# Patient Record
Sex: Female | Born: 1935
Health system: Southern US, Community
[De-identification: ages and names within clinical notes are randomized; demographics above are authoritative.]

## PROBLEM LIST (undated history)

## (undated) DIAGNOSIS — I480 Paroxysmal atrial fibrillation: Secondary | ICD-10-CM

## (undated) DIAGNOSIS — E669 Obesity, unspecified: Secondary | ICD-10-CM

## (undated) DIAGNOSIS — IMO0001 Reserved for inherently not codable concepts without codable children: Secondary | ICD-10-CM

## (undated) DIAGNOSIS — I10 Essential (primary) hypertension: Secondary | ICD-10-CM

## (undated) DIAGNOSIS — IMO0002 Reserved for concepts with insufficient information to code with codable children: Secondary | ICD-10-CM

## (undated) DIAGNOSIS — I509 Heart failure, unspecified: Secondary | ICD-10-CM

## (undated) DIAGNOSIS — I2510 Atherosclerotic heart disease of native coronary artery without angina pectoris: Secondary | ICD-10-CM

## (undated) DIAGNOSIS — E119 Type 2 diabetes mellitus without complications: Secondary | ICD-10-CM

## (undated) DIAGNOSIS — I639 Cerebral infarction, unspecified: Secondary | ICD-10-CM

## (undated) DIAGNOSIS — E785 Hyperlipidemia, unspecified: Secondary | ICD-10-CM

## (undated) DIAGNOSIS — R42 Dizziness and giddiness: Secondary | ICD-10-CM

## (undated) DIAGNOSIS — K5900 Constipation, unspecified: Secondary | ICD-10-CM

## (undated) DIAGNOSIS — K219 Gastro-esophageal reflux disease without esophagitis: Secondary | ICD-10-CM

## (undated) DIAGNOSIS — N390 Urinary tract infection, site not specified: Secondary | ICD-10-CM

## (undated) DIAGNOSIS — N186 End stage renal disease: Secondary | ICD-10-CM

## (undated) DIAGNOSIS — C50919 Malignant neoplasm of unspecified site of unspecified female breast: Secondary | ICD-10-CM

## (undated) DIAGNOSIS — G4730 Sleep apnea, unspecified: Secondary | ICD-10-CM

## (undated) DIAGNOSIS — M1990 Unspecified osteoarthritis, unspecified site: Secondary | ICD-10-CM

## (undated) HISTORY — DX: Gastro-esophageal reflux disease without esophagitis: K21.9

## (undated) HISTORY — DX: Essential (primary) hypertension: I10

## (undated) HISTORY — DX: Hyperlipidemia, unspecified: E78.5

## (undated) HISTORY — DX: Urinary tract infection, site not specified: N39.0

## (undated) HISTORY — PX: OTHER SURGICAL HISTORY: SHX169

## (undated) HISTORY — DX: Obesity, unspecified: E66.9

## (undated) HISTORY — PX: CHOLECYSTECTOMY: SHX55

## (undated) HISTORY — DX: Type 2 diabetes mellitus without complications: E11.9

## (undated) HISTORY — DX: Reserved for inherently not codable concepts without codable children: IMO0001

## (undated) HISTORY — DX: Unspecified osteoarthritis, unspecified site: M19.90

## (undated) HISTORY — DX: Malignant neoplasm of unspecified site of unspecified female breast: C50.919

## (undated) HISTORY — DX: Reserved for concepts with insufficient information to code with codable children: IMO0002

## (undated) HISTORY — DX: Atherosclerotic heart disease of native coronary artery without angina pectoris: I25.10

---

## 1997-10-01 ENCOUNTER — Other Ambulatory Visit: Admission: RE | Admit: 1997-10-01 | Discharge: 1997-10-01 | Payer: Self-pay | Admitting: Internal Medicine

## 1997-11-29 ENCOUNTER — Ambulatory Visit (HOSPITAL_COMMUNITY): Admission: RE | Admit: 1997-11-29 | Discharge: 1997-11-29 | Payer: Self-pay | Admitting: *Deleted

## 1999-12-17 ENCOUNTER — Encounter: Payer: Self-pay | Admitting: General Surgery

## 1999-12-17 ENCOUNTER — Inpatient Hospital Stay (HOSPITAL_COMMUNITY): Admission: EM | Admit: 1999-12-17 | Discharge: 1999-12-27 | Payer: Self-pay | Admitting: *Deleted

## 1999-12-17 ENCOUNTER — Encounter (INDEPENDENT_AMBULATORY_CARE_PROVIDER_SITE_OTHER): Payer: Self-pay | Admitting: Specialist

## 1999-12-18 ENCOUNTER — Encounter: Payer: Self-pay | Admitting: General Surgery

## 1999-12-21 ENCOUNTER — Encounter: Payer: Self-pay | Admitting: General Surgery

## 1999-12-22 ENCOUNTER — Encounter: Payer: Self-pay | Admitting: Surgery

## 2002-01-30 ENCOUNTER — Encounter (HOSPITAL_BASED_OUTPATIENT_CLINIC_OR_DEPARTMENT_OTHER): Admission: RE | Admit: 2002-01-30 | Discharge: 2002-04-30 | Payer: Self-pay | Admitting: Internal Medicine

## 2002-05-04 ENCOUNTER — Encounter (HOSPITAL_BASED_OUTPATIENT_CLINIC_OR_DEPARTMENT_OTHER): Admission: RE | Admit: 2002-05-04 | Discharge: 2002-08-02 | Payer: Self-pay | Admitting: Internal Medicine

## 2002-10-08 ENCOUNTER — Encounter: Payer: Self-pay | Admitting: Emergency Medicine

## 2002-10-08 ENCOUNTER — Emergency Department (HOSPITAL_COMMUNITY): Admission: EM | Admit: 2002-10-08 | Discharge: 2002-10-08 | Payer: Self-pay | Admitting: Emergency Medicine

## 2002-10-08 ENCOUNTER — Inpatient Hospital Stay (HOSPITAL_COMMUNITY): Admission: EM | Admit: 2002-10-08 | Discharge: 2002-10-09 | Payer: Self-pay | Admitting: Cardiology

## 2002-11-29 ENCOUNTER — Encounter (HOSPITAL_BASED_OUTPATIENT_CLINIC_OR_DEPARTMENT_OTHER): Admission: RE | Admit: 2002-11-29 | Discharge: 2002-12-04 | Payer: Self-pay | Admitting: Internal Medicine

## 2003-03-19 ENCOUNTER — Encounter (HOSPITAL_BASED_OUTPATIENT_CLINIC_OR_DEPARTMENT_OTHER): Admission: RE | Admit: 2003-03-19 | Discharge: 2003-03-27 | Payer: Self-pay | Admitting: Internal Medicine

## 2003-06-28 ENCOUNTER — Encounter: Admission: RE | Admit: 2003-06-28 | Discharge: 2003-06-28 | Payer: Self-pay | Admitting: Gastroenterology

## 2003-07-03 ENCOUNTER — Ambulatory Visit (HOSPITAL_COMMUNITY): Admission: RE | Admit: 2003-07-03 | Discharge: 2003-07-03 | Payer: Self-pay | Admitting: Gastroenterology

## 2003-08-07 ENCOUNTER — Encounter (INDEPENDENT_AMBULATORY_CARE_PROVIDER_SITE_OTHER): Payer: Self-pay | Admitting: Specialist

## 2003-08-07 ENCOUNTER — Ambulatory Visit (HOSPITAL_COMMUNITY): Admission: RE | Admit: 2003-08-07 | Discharge: 2003-08-07 | Payer: Self-pay | Admitting: Gastroenterology

## 2003-08-23 ENCOUNTER — Encounter (HOSPITAL_BASED_OUTPATIENT_CLINIC_OR_DEPARTMENT_OTHER): Admission: RE | Admit: 2003-08-23 | Discharge: 2003-09-13 | Payer: Self-pay | Admitting: Internal Medicine

## 2004-01-01 ENCOUNTER — Encounter (HOSPITAL_BASED_OUTPATIENT_CLINIC_OR_DEPARTMENT_OTHER): Admission: RE | Admit: 2004-01-01 | Discharge: 2004-01-21 | Payer: Self-pay | Admitting: Internal Medicine

## 2004-02-12 ENCOUNTER — Ambulatory Visit (HOSPITAL_COMMUNITY): Admission: RE | Admit: 2004-02-12 | Discharge: 2004-02-12 | Payer: Self-pay | Admitting: Gastroenterology

## 2004-06-04 ENCOUNTER — Ambulatory Visit: Payer: Self-pay | Admitting: Cardiology

## 2004-06-08 ENCOUNTER — Ambulatory Visit: Payer: Self-pay

## 2004-07-02 ENCOUNTER — Ambulatory Visit: Payer: Self-pay | Admitting: Cardiology

## 2004-09-23 ENCOUNTER — Emergency Department (HOSPITAL_COMMUNITY): Admission: EM | Admit: 2004-09-23 | Discharge: 2004-09-23 | Payer: Self-pay | Admitting: Family Medicine

## 2005-10-18 ENCOUNTER — Ambulatory Visit: Payer: Self-pay | Admitting: Cardiology

## 2005-12-11 ENCOUNTER — Emergency Department (HOSPITAL_COMMUNITY): Admission: AD | Admit: 2005-12-11 | Discharge: 2005-12-11 | Payer: Self-pay | Admitting: Emergency Medicine

## 2006-11-23 ENCOUNTER — Ambulatory Visit: Payer: Self-pay | Admitting: Cardiology

## 2006-12-02 ENCOUNTER — Ambulatory Visit: Payer: Self-pay

## 2007-11-14 ENCOUNTER — Ambulatory Visit: Payer: Self-pay | Admitting: Cardiology

## 2007-12-14 ENCOUNTER — Other Ambulatory Visit: Admission: RE | Admit: 2007-12-14 | Discharge: 2007-12-14 | Payer: Self-pay | Admitting: Internal Medicine

## 2008-05-22 ENCOUNTER — Ambulatory Visit (HOSPITAL_COMMUNITY): Admission: RE | Admit: 2008-05-22 | Discharge: 2008-05-22 | Payer: Self-pay | Admitting: Internal Medicine

## 2008-06-17 ENCOUNTER — Ambulatory Visit (HOSPITAL_COMMUNITY): Admission: RE | Admit: 2008-06-17 | Discharge: 2008-06-17 | Payer: Self-pay | Admitting: Internal Medicine

## 2008-10-17 ENCOUNTER — Encounter (INDEPENDENT_AMBULATORY_CARE_PROVIDER_SITE_OTHER): Payer: Self-pay | Admitting: *Deleted

## 2008-10-17 LAB — CONVERTED CEMR LAB
ALT: 11 units/L
AST: 21 units/L
Alkaline Phosphatase: 125 units/L
CO2: 23 meq/L
Cholesterol: 125 mg/dL
Creatinine, Ser: 0.81 mg/dL
HDL: 58 mg/dL
Hgb A1c MFr Bld: 12.4 %
LDL Cholesterol: 31 mg/dL
Total Protein: 7.8 g/dL
Triglycerides: 178 mg/dL

## 2008-12-20 ENCOUNTER — Encounter: Payer: Self-pay | Admitting: Cardiology

## 2009-02-14 ENCOUNTER — Inpatient Hospital Stay (HOSPITAL_COMMUNITY): Admission: RE | Admit: 2009-02-14 | Discharge: 2009-02-15 | Payer: Self-pay | Admitting: Internal Medicine

## 2009-02-14 ENCOUNTER — Ambulatory Visit: Payer: Self-pay | Admitting: Cardiology

## 2009-02-14 ENCOUNTER — Encounter: Payer: Self-pay | Admitting: Emergency Medicine

## 2009-02-14 HISTORY — PX: CORONARY ANGIOPLASTY: SHX604

## 2009-02-26 DIAGNOSIS — I2510 Atherosclerotic heart disease of native coronary artery without angina pectoris: Secondary | ICD-10-CM

## 2009-02-26 DIAGNOSIS — E1065 Type 1 diabetes mellitus with hyperglycemia: Secondary | ICD-10-CM

## 2009-02-26 DIAGNOSIS — IMO0002 Reserved for concepts with insufficient information to code with codable children: Secondary | ICD-10-CM | POA: Insufficient documentation

## 2009-02-26 DIAGNOSIS — I10 Essential (primary) hypertension: Secondary | ICD-10-CM

## 2009-02-26 DIAGNOSIS — K219 Gastro-esophageal reflux disease without esophagitis: Secondary | ICD-10-CM | POA: Insufficient documentation

## 2009-02-26 DIAGNOSIS — N390 Urinary tract infection, site not specified: Secondary | ICD-10-CM

## 2009-02-26 DIAGNOSIS — E785 Hyperlipidemia, unspecified: Secondary | ICD-10-CM

## 2009-02-26 DIAGNOSIS — R079 Chest pain, unspecified: Secondary | ICD-10-CM

## 2009-02-26 DIAGNOSIS — E669 Obesity, unspecified: Secondary | ICD-10-CM

## 2009-02-28 ENCOUNTER — Encounter (INDEPENDENT_AMBULATORY_CARE_PROVIDER_SITE_OTHER): Payer: Self-pay

## 2009-02-28 LAB — CONVERTED CEMR LAB
Bilirubin, Direct: 0.4 mg/dL
CO2: 25 meq/L
Glucose, Bld: 185 mg/dL
Sodium: 142 meq/L
Total Protein: 7.4 g/dL

## 2009-03-04 ENCOUNTER — Ambulatory Visit: Payer: Self-pay | Admitting: Cardiology

## 2009-03-04 ENCOUNTER — Encounter (INDEPENDENT_AMBULATORY_CARE_PROVIDER_SITE_OTHER): Payer: Self-pay

## 2009-03-04 ENCOUNTER — Encounter: Payer: Self-pay | Admitting: Adult Health

## 2009-03-06 ENCOUNTER — Inpatient Hospital Stay (HOSPITAL_COMMUNITY): Admission: EM | Admit: 2009-03-06 | Discharge: 2009-03-09 | Payer: Self-pay | Admitting: Emergency Medicine

## 2009-03-06 ENCOUNTER — Ambulatory Visit: Payer: Self-pay | Admitting: Internal Medicine

## 2009-03-07 ENCOUNTER — Encounter (INDEPENDENT_AMBULATORY_CARE_PROVIDER_SITE_OTHER): Payer: Self-pay | Admitting: Internal Medicine

## 2009-03-07 ENCOUNTER — Ambulatory Visit: Payer: Self-pay | Admitting: Vascular Surgery

## 2009-03-16 ENCOUNTER — Encounter (INDEPENDENT_AMBULATORY_CARE_PROVIDER_SITE_OTHER): Payer: Self-pay

## 2009-03-16 LAB — CONVERTED CEMR LAB
Hemoglobin: 12.2 g/dL
Platelets: 200 10*3/uL
WBC: 5.4 10*3/uL

## 2009-04-26 ENCOUNTER — Emergency Department (HOSPITAL_COMMUNITY): Admission: EM | Admit: 2009-04-26 | Discharge: 2009-04-26 | Payer: Self-pay | Admitting: Emergency Medicine

## 2009-04-30 ENCOUNTER — Encounter (INDEPENDENT_AMBULATORY_CARE_PROVIDER_SITE_OTHER): Payer: Self-pay | Admitting: *Deleted

## 2009-05-01 ENCOUNTER — Emergency Department (HOSPITAL_COMMUNITY): Admission: EM | Admit: 2009-05-01 | Discharge: 2009-05-01 | Payer: Self-pay | Admitting: Emergency Medicine

## 2009-10-09 ENCOUNTER — Ambulatory Visit: Payer: Self-pay | Admitting: Cardiology

## 2010-05-26 NOTE — Miscellaneous (Signed)
Summary: labs cmp,lipids,a1c 10/17/2008  Clinical Lists Changes  Observations: Added new observation of CALCIUM: 9.1 mg/dL (10/17/2008 9:47) Added new observation of ALBUMIN: 3.6 g/dL (10/17/2008 9:47) Added new observation of PROTEIN, TOT: 7.8 g/dL (10/17/2008 9:47) Added new observation of SGPT (ALT): 11 units/L (10/17/2008 9:47) Added new observation of SGOT (AST): 21 units/L (10/17/2008 9:47) Added new observation of ALK PHOS: 125 units/L (10/17/2008 9:47) Added new observation of CREATININE: 0.81 mg/dL (10/17/2008 9:47) Added new observation of BUN: 11 mg/dL (10/17/2008 9:47) Added new observation of BG RANDOM: 73 mg/dL (10/17/2008 9:47) Added new observation of CO2 PLSM/SER: 23 meq/L (10/17/2008 9:47) Added new observation of CL SERUM: 107 meq/L (10/17/2008 9:47) Added new observation of K SERUM: 3.1 meq/L (10/17/2008 9:47) Added new observation of NA: 143 meq/L (10/17/2008 9:47) Added new observation of LDL: 31 mg/dL (10/17/2008 9:47) Added new observation of HDL: 58 mg/dL (10/17/2008 9:47) Added new observation of TRIGLYC TOT: 178 mg/dL (10/17/2008 9:47) Added new observation of CHOLESTEROL: 125 mg/dL (10/17/2008 9:47) Added new observation of HGBA1C: 12.4 % (10/17/2008 9:47)

## 2010-05-26 NOTE — Assessment & Plan Note (Signed)
Summary: ROV  Medications Added PRAVASTATIN SODIUM 80 MG TABS (PRAVASTATIN SODIUM) 1 once daily LABETALOL HCL 300 MG TABS (LABETALOL HCL) 1 tab two times a day POTASSIUM CHLORIDE CR 10 MEQ CR-TABS (POTASSIUM CHLORIDE) 1 tab once daily FUROSEMIDE 40 MG TABS (FUROSEMIDE) 1 tab as needed TRIBENZOR 40-5-25 MG TABS (OLMESARTAN-AMLODIPINE-HCTZ) 1 tab once daily      Allergies Added: NKDA  Visit Type:  rov Primary Provider:  Erik Obey  CC:  pt states she gets sob when she takes ibuprofen pt states that Dr. Carlis Abbott said it was still ok to take since it is just prn.....edema/feet said was told from the ibuprofen as well.  History of Present Illness: Mrs. Depaul comes in today for followup of her nonobstructive coronary disease.  She's having no angina or ischemic symptoms. She has had some problems with her blood pressure and Dr. Carlis Abbott is been working with her. She had a fall back earlier this year and had been hospitalized. She had no premonition that she was going to fall. She apparently lost consciousness. She had no symptoms of chest pain, palpitations, prior to the event.  She is on simvastatin and amlodipine. We'll have to change this based on recent guidelines and warnings. She denies any muscle pains or aches the  Current Medications (verified): 1)  Pravastatin Sodium 80 Mg Tabs (Pravastatin Sodium) .Marland Kitchen.. 1 Once Daily 2)  Labetalol Hcl 300 Mg Tabs (Labetalol Hcl) .Marland Kitchen.. 1 Tab Two Times A Day 3)  Potassium Chloride Cr 10 Meq Cr-Tabs (Potassium Chloride) .Marland Kitchen.. 1 Tab Once Daily 4)  Aspir-Low 81 Mg Tbec (Aspirin) .... Take 1 Tab Daily 5)  Lantus 100 Unit/ml Soln (Insulin Glargine) .... 50 Units At Bedtime 6)  Nexium 40 Mg Cpdr (Esomeprazole Magnesium) .... Take 1 Tab Daily 7)  Novolin N 100 Unit/ml Susp (Insulin Isophane Human) .... Sliding Scale 8)  Furosemide 40 Mg Tabs (Furosemide) .Marland Kitchen.. 1 Tab As Needed 9)  Tribenzor 40-5-25 Mg Tabs (Olmesartan-Amlodipine-Hctz) .Marland Kitchen.. 1 Tab Once  Daily  Allergies (verified): No Known Drug Allergies  Past History:  Past Medical History: Last updated: 02/26/2009 Current Problems:  GASTROESOPHAGEAL REFLUX DISEASE (ICD-530.81) OBESITY (ICD-278.00) DM (ICD-250.00) HYPERTENSION (ICD-401.9) HYPERLIPIDEMIA (ICD-272.4) CAD (ICD-414.00) UTI (ICD-599.0) CHEST PAIN (ICD-786.50)  Past Surgical History: Last updated: 02/26/2009 cholecystectomy 2001 history of colon polyps cath 02/14/09  Social History: Last updated: 02/26/2009 Retired   Review of Systems       negative other than history of present illness  Vital Signs:  Patient profile:   77 year old female Height:      66 inches Weight:      185 pounds BMI:     29.97 Pulse rate:   72 / minute Pulse rhythm:   regular BP sitting:   156 / 70  (left arm) Cuff size:   large  Vitals Entered By: Julaine Hua, CMA (October 09, 2009 3:27 PM)  Physical Exam  General:  obese.  obese.   Head:  normocephalic and atraumatic Eyes:  PERRLA/EOM intact; conjunctiva and lids normal. Neck:  Neck supple, no JVD. No masses, thyromegaly or abnormal cervical nodes. Chest Burns Timson:  no deformities or breast masses noted Lungs:  Clear bilaterally to auscultation and percussion. Heart:  Non-displaced PMI, chest non-tender; regular rate and rhythm, S1, S2 without murmurs, rubs or gallops. Carotid upstroke normal, no bruit. Normal abdominal aortic size, no bruits. Femorals normal pulses, no bruits. Pedals normal pulses. No edema, no varicosities. Msk:  decreased ROM.  decreased ROM.   Pulses:  pulses normal in all 4 extremities Extremities:  No clubbing or cyanosis. Neurologic:  Alert and oriented x 3. Skin:  Intact without lesions or rashes. Psych:  Normal affect.   Impression & Recommendations:  Problem # 1:  CAD (ICD-414.00)  The following medications were removed from the medication list:    Benazepril Hcl 40 Mg Tabs (Benazepril hcl) .Marland Kitchen... Take 1 tab daily    Amlodipine Besylate 10 Mg  Tabs (Amlodipine besylate) .Marland Kitchen... Take 1 tab daily Her updated medication list for this problem includes:    Labetalol Hcl 300 Mg Tabs (Labetalol hcl) .Marland Kitchen... 1 tab two times a day    Aspir-low 81 Mg Tbec (Aspirin) .Marland Kitchen... Take 1 tab daily    Tribenzor 40-5-25 Mg Tabs (Olmesartan-amlodipine-hctz) .Marland Kitchen... 1 tab once daily  The following medications were removed from the medication list:    Benazepril Hcl 40 Mg Tabs (Benazepril hcl) .Marland Kitchen... Take 1 tab daily Her updated medication list for this problem includes:    Labetalol Hcl 300 Mg Tabs (Labetalol hcl) .Marland Kitchen... 1 tab two times a day    Amlodipine Besylate 10 Mg Tabs (Amlodipine besylate) .Marland Kitchen... Take 1 tab daily    Aspir-low 81 Mg Tbec (Aspirin) .Marland Kitchen... Take 1 tab daily    Tribenzor 40-5-25 Mg Tabs (Olmesartan-amlodipine-hctz) .Marland Kitchen... 1 tab once daily  Problem # 2:  HYPERTENSION (ICD-401.9)  The following medications were removed from the medication list:    Hydrochlorothiazide 25 Mg Tabs (Hydrochlorothiazide) .Marland Kitchen... Take 1/2 tab daily    Benazepril Hcl 40 Mg Tabs (Benazepril hcl) .Marland Kitchen... Take 1 tab daily    Amlodipine Besylate 10 Mg Tabs (Amlodipine besylate) .Marland Kitchen... Take 1 tab daily Her updated medication list for this problem includes:    Labetalol Hcl 300 Mg Tabs (Labetalol hcl) .Marland Kitchen... 1 tab two times a day    Aspir-low 81 Mg Tbec (Aspirin) .Marland Kitchen... Take 1 tab daily    Furosemide 40 Mg Tabs (Furosemide) .Marland Kitchen... 1 tab as needed    Tribenzor 40-5-25 Mg Tabs (Olmesartan-amlodipine-hctz) .Marland Kitchen... 1 tab once daily  The following medications were removed from the medication list:    Hydrochlorothiazide 25 Mg Tabs (Hydrochlorothiazide) .Marland Kitchen... Take 1/2 tab daily    Benazepril Hcl 40 Mg Tabs (Benazepril hcl) .Marland Kitchen... Take 1 tab daily Her updated medication list for this problem includes:    Labetalol Hcl 300 Mg Tabs (Labetalol hcl) .Marland Kitchen... 1 tab two times a day    Amlodipine Besylate 10 Mg Tabs (Amlodipine besylate) .Marland Kitchen... Take 1 tab daily    Aspir-low 81 Mg Tbec (Aspirin)  .Marland Kitchen... Take 1 tab daily    Furosemide 40 Mg Tabs (Furosemide) .Marland Kitchen... 1 tab as needed    Tribenzor 40-5-25 Mg Tabs (Olmesartan-amlodipine-hctz) .Marland Kitchen... 1 tab once daily  Problem # 3:  HYPERLIPIDEMIA (ICD-272.4) Assessment: Unchanged Because of warnings of being on 40 mg of simvastatin and amlodipine, we will change her pravastatin 80 mg each night. I've asked her to have her blood work check with Dr. Carlis Abbott in 6-8 weeks. I'll send him a note. Goal LDL is 70. Her updated medication list for this problem includes:    Pravastatin Sodium 80 Mg Tabs (Pravastatin sodium) .Marland Kitchen... 1 once daily  Her updated medication list for this problem includes:    Simvastatin 40 Mg Tabs (Simvastatin) .Marland Kitchen... Take 1 tab daily  Problem # 4:  OBESITY (ICD-278.00) Assessment: Unchanged  Problem # 5:  DM (ICD-250.00)  The following medications were removed from the medication list:    Benazepril Hcl 40 Mg Tabs (Benazepril  hcl) ..... Take 1 tab daily Her updated medication list for this problem includes:    Aspir-low 81 Mg Tbec (Aspirin) .Marland Kitchen... Take 1 tab daily    Lantus 100 Unit/ml Soln (Insulin glargine) .Marland KitchenMarland KitchenMarland KitchenMarland Kitchen 50 units at bedtime    Novolin N 100 Unit/ml Susp (Insulin isophane human) ..... Sliding scale    Tribenzor 40-5-25 Mg Tabs (Olmesartan-amlodipine-hctz) .Marland Kitchen... 1 tab once daily  The following medications were removed from the medication list:    Benazepril Hcl 40 Mg Tabs (Benazepril hcl) .Marland Kitchen... Take 1 tab daily Her updated medication list for this problem includes:    Aspir-low 81 Mg Tbec (Aspirin) .Marland Kitchen... Take 1 tab daily    Lantus 100 Unit/ml Soln (Insulin glargine) .Marland KitchenMarland KitchenMarland KitchenMarland Kitchen 50 units at bedtime    Novolin N 100 Unit/ml Susp (Insulin isophane human) ..... Sliding scale    Tribenzor 40-5-25 Mg Tabs (Olmesartan-amlodipine-hctz) .Marland Kitchen... 1 tab once daily  Clinical Reports Reviewed:  Cardiac Cath:  02/14/2009: Cardiac Cath Findings:  PRIMARY CARE PHYSICIAN:  Jeanann Lewandowsky, MD      PROCEDURE:  Left heart  catheterization/coronary arteriography.      INDICATIONS:  Evaluate the patient with chest pain, suggestive unstable   angina.      PROCEDURE NOTE:  Left heart catheterization was performed via the right   femoral artery.  The artery was cannulated using the anterior Marchel Foote   puncture.  A #5-French arterial sheath was inserted via the modified   Seldinger technique.  Preformed Judkins and a pigtail catheter were   utilized.  The patient tolerated the procedure well and left the lab in   stable condition.      RESULTS:  Hemodynamics:  LV 172/5, AO 172/66.      Coronaries:  Left main was normal.  The LAD had proximal luminal   irregularities.  There was mid 60% stenosis.  This was a long lesion   beginning after the mid diagonal.  It was unchanged from previous films.   I compared these to those films.  The mid diagonal was small with ostial   30% stenosis.  Of note, the LAD was large wrapping the apex.  Circumflex   was large.  In the AV groove, there was long 30% stenosis after mid   obtuse marginal.  The mid obtuse marginal was large and branching.   There was ostial 25% stenosis.  An inferior branch had 25% stenosis.   There was a small posterolateral, which was normal.  The right coronary   artery was dominant and narrow in caliber.  There was proximal long 25%   stenosis.  PDA was small and normal.  A posterolateral was small and   normal.      Left ventriculogram:  The left ventriculogram was obtained in the RAO   projection.  The EF was 70% with normal Adelfa Lozito motion.      CONCLUSION:  Nonobstructive coronary artery disease, unchanged from   previous.  Well-preserved ejection fraction.      PLAN:  The patient will continue to have medical management.               Minus Breeding, MD, [facility]  Cardiac Cath Findings:  RESULTS:  Hemodynamics:  LV 172/5, AO 172/66.      Coronaries:  Left main was normal.  The LAD had proximal luminal   irregularities.  There was mid 60% stenosis.   This was a long lesion   beginning after the mid diagonal.  It was unchanged from previous films.  I compared these to those films.  The mid diagonal was small with ostial   30% stenosis.  Of note, the LAD was large wrapping the apex.  Circumflex   was large.  In the AV groove, there was long 30% stenosis after mid   obtuse marginal.  The mid obtuse marginal was large and branching.   There was ostial 25% stenosis.  An inferior branch had 25% stenosis.   There was a small posterolateral, which was normal.  The right coronary   artery was dominant and narrow in caliber.  There was proximal long 25%   stenosis.  PDA was small and normal.  A posterolateral was small and   normal.      Left ventriculogram:  The left ventriculogram was obtained in the RAO   projection.  The EF was 70% with normal Richy Spradley motion.      CONCLUSION:  Nonobstructive coronary artery disease, unchanged from   previous.  Well-preserved ejection fraction.      PLAN:  The patient will continue to have medical management.               Minus Breeding, MD, [facility]   Electronically Signed      10/08/2002: Cardiac Cath Findings:   CONCLUSIONS:  1. Hypodynamic left ventricular function.  2. Scattered disease of the left anterior descending and diagonal as noted     on previous study.  3. Moderate obstruction of the AV circumflex after the large marginal     takeoff.  4. Scattered irregularities of the right coronary artery as described above.   DISPOSITION:  The initial plan would be to treat the patient medically.  It  is unclear which, if any, of these lesions might be causing significant  angina.  We plan to check a D-dimer.  We also plan to give the patient  medical therapy.  She will be followed by Jeanann Lewandowsky, M.D. and either  Junious Silk, M.D. [facility] or Loretha Brasil. [name], M.D.                                             Loretha Brasil. [name], M.D.   TDS/MEDQ  D:  10/08/2002  T:  10/08/2002  Job:   [id number]   Patient Instructions: 1)  Your physician recommends that you schedule a follow-up appointment in: [place] 2)  Your physician recommends that you return for lab work in [place].69AT DR Anell Barr OFFICE  3)  Your physician has recommended you make the following change in your medication: STOP SIMVASTATIN  4)  START PRAVASTATIN 80 MG 1 once daily Prescriptions: PRAVASTATIN SODIUM 80 MG TABS (PRAVASTATIN SODIUM) 1 once daily  #30 x 11   Entered by:   Devra Dopp, LPN   Authorized by:   Renella Cunas, MD, [facility]   Signed by:   Devra Dopp, LPN on [id number]   Method used:   Electronically to        [facility] (retail)       [facility] [address]       [place], [place]  [address]       Ph: [phone]       Fax: [phone]   RxID:   [id number]

## 2010-07-11 LAB — URINALYSIS, ROUTINE W REFLEX MICROSCOPIC
Bilirubin Urine: NEGATIVE
Glucose, UA: NEGATIVE mg/dL
Ketones, ur: NEGATIVE mg/dL
Nitrite: NEGATIVE
pH: 5 (ref 5.0–8.0)

## 2010-07-11 LAB — POCT I-STAT, CHEM 8
BUN: 14 mg/dL (ref 6–23)
Chloride: 111 mEq/L (ref 96–112)
Potassium: 4.1 mEq/L (ref 3.5–5.1)
Sodium: 144 mEq/L (ref 135–145)

## 2010-07-11 LAB — URINE MICROSCOPIC-ADD ON

## 2010-07-29 LAB — DIFFERENTIAL
Basophils Relative: 0 % (ref 0–1)
Basophils Relative: 1 % (ref 0–1)
Eosinophils Absolute: 0.1 10*3/uL (ref 0.0–0.7)
Lymphs Abs: 1.6 10*3/uL (ref 0.7–4.0)
Lymphs Abs: 2.2 10*3/uL (ref 0.7–4.0)
Monocytes Absolute: 0.5 10*3/uL (ref 0.1–1.0)
Monocytes Absolute: 0.6 10*3/uL (ref 0.1–1.0)
Monocytes Relative: 8 % (ref 3–12)
Monocytes Relative: 8 % (ref 3–12)
Neutro Abs: 4.2 10*3/uL (ref 1.7–7.7)
Neutro Abs: 4.4 10*3/uL (ref 1.7–7.7)

## 2010-07-29 LAB — GLUCOSE, CAPILLARY
Glucose-Capillary: 147 mg/dL — ABNORMAL HIGH (ref 70–99)
Glucose-Capillary: 174 mg/dL — ABNORMAL HIGH (ref 70–99)
Glucose-Capillary: 180 mg/dL — ABNORMAL HIGH (ref 70–99)
Glucose-Capillary: 184 mg/dL — ABNORMAL HIGH (ref 70–99)
Glucose-Capillary: 215 mg/dL — ABNORMAL HIGH (ref 70–99)
Glucose-Capillary: 238 mg/dL — ABNORMAL HIGH (ref 70–99)
Glucose-Capillary: 239 mg/dL — ABNORMAL HIGH (ref 70–99)

## 2010-07-29 LAB — CBC
Hemoglobin: 10.6 g/dL — ABNORMAL LOW (ref 12.0–15.0)
Hemoglobin: 12.1 g/dL (ref 12.0–15.0)
MCHC: 32.6 g/dL (ref 30.0–36.0)
MCHC: 33.3 g/dL (ref 30.0–36.0)
MCV: 82.6 fL (ref 78.0–100.0)
RBC: 3.87 MIL/uL (ref 3.87–5.11)
RBC: 4.52 MIL/uL (ref 3.87–5.11)
WBC: 7.1 10*3/uL (ref 4.0–10.5)

## 2010-07-29 LAB — HEPATIC FUNCTION PANEL
ALT: 20 U/L (ref 0–35)
AST: 31 U/L (ref 0–37)
Alkaline Phosphatase: 115 U/L (ref 39–117)
Bilirubin, Direct: 0.1 mg/dL (ref 0.0–0.3)
Total Bilirubin: 0.6 mg/dL (ref 0.3–1.2)

## 2010-07-29 LAB — URINALYSIS, ROUTINE W REFLEX MICROSCOPIC
Bilirubin Urine: NEGATIVE
Hgb urine dipstick: NEGATIVE
Specific Gravity, Urine: 1.012 (ref 1.005–1.030)
pH: 5.5 (ref 5.0–8.0)

## 2010-07-29 LAB — BASIC METABOLIC PANEL
CO2: 24 mEq/L (ref 19–32)
CO2: 25 mEq/L (ref 19–32)
CO2: 25 mEq/L (ref 19–32)
Calcium: 8.6 mg/dL (ref 8.4–10.5)
Calcium: 8.8 mg/dL (ref 8.4–10.5)
Chloride: 105 mEq/L (ref 96–112)
Chloride: 110 mEq/L (ref 96–112)
Creatinine, Ser: 1.07 mg/dL (ref 0.4–1.2)
Creatinine, Ser: 1.22 mg/dL — ABNORMAL HIGH (ref 0.4–1.2)
GFR calc Af Amer: >60 mL/min (ref >60)
GFR calc Af Amer: >60 mL/min (ref >60)
Glucose, Bld: 350 mg/dL — ABNORMAL HIGH (ref 70–99)
Sodium: 136 mEq/L (ref 135–145)
Sodium: 136 mEq/L (ref 135–145)
Sodium: 141 mEq/L (ref 135–145)

## 2010-07-29 LAB — CARDIAC PANEL(CRET KIN+CKTOT+MB+TROPI)
CK, MB: 6.1 ng/mL — ABNORMAL HIGH (ref 0.3–4.0)
Total CK: 487 U/L — ABNORMAL HIGH (ref 7–177)
Troponin I: 0.02 ng/mL (ref 0.00–0.06)

## 2010-07-29 LAB — HEMOGLOBIN A1C: Mean Plasma Glucose: 309 mg/dL

## 2010-07-29 LAB — POCT CARDIAC MARKERS
CKMB, poc: 10.2 ng/mL (ref 1.0–8.0)
Myoglobin, poc: >500 ng/mL (ref 12–200)

## 2010-07-29 LAB — URINE CULTURE: Colony Count: 30000

## 2010-07-29 LAB — URINE MICROSCOPIC-ADD ON

## 2010-07-29 LAB — APTT: aPTT: 27 seconds (ref 24–37)

## 2010-07-30 LAB — CBC
HCT: 30.3 % — ABNORMAL LOW (ref 36.0–46.0)
HCT: 34.4 % — ABNORMAL LOW (ref 36.0–46.0)
Hemoglobin: 12.6 g/dL (ref 12.0–15.0)
Hemoglobin: 11.2 g/dL — ABNORMAL LOW (ref 12.0–15.0)
MCHC: 31.9 g/dL (ref 30.0–36.0)
MCHC: 32.6 g/dL (ref 30.0–36.0)
Platelets: 147 10*3/uL — ABNORMAL LOW (ref 150–400)
RBC: 4.8 MIL/uL (ref 3.87–5.11)
RBC: 4.2 MIL/uL (ref 3.87–5.11)
WBC: 5.5 10*3/uL (ref 4.0–10.5)
WBC: 6 10*3/uL (ref 4.0–10.5)

## 2010-07-30 LAB — BASIC METABOLIC PANEL
CO2: 27 mEq/L (ref 19–32)
Calcium: 8.3 mg/dL — ABNORMAL LOW (ref 8.4–10.5)
Glucose, Bld: 152 mg/dL — ABNORMAL HIGH (ref 70–99)
Sodium: 143 mEq/L (ref 135–145)

## 2010-07-30 LAB — PROTIME-INR
INR: 1.01 (ref 0.00–1.49)
Prothrombin Time: 13.2 s (ref 11.6–15.2)

## 2010-07-30 LAB — CARDIAC PANEL(CRET KIN+CKTOT+MB+TROPI)
CK, MB: 0.7 ng/mL (ref 0.3–4.0)
CK, MB: 0.9 ng/mL (ref 0.3–4.0)
CK, MB: 1.1 ng/mL (ref 0.3–4.0)
CK, MB: 1.2 ng/mL (ref 0.3–4.0)
Relative Index: INVALID (ref 0.0–2.5)
Relative Index: INVALID (ref 0.0–2.5)
Relative Index: INVALID (ref 0.0–2.5)
Relative Index: INVALID (ref 0.0–2.5)
Relative Index: INVALID (ref 0.0–2.5)
Total CK: 62 U/L (ref 7–177)
Total CK: 68 U/L (ref 7–177)
Total CK: 74 U/L (ref 7–177)
Total CK: 75 U/L (ref 7–177)
Total CK: 77 U/L (ref 7–177)
Troponin I: 0.01 ng/mL (ref 0.00–0.06)
Troponin I: 0.01 ng/mL (ref 0.00–0.06)
Troponin I: 0.02 ng/mL (ref 0.00–0.06)
Troponin I: 0.02 ng/mL (ref 0.00–0.06)
Troponin I: 0.05 ng/mL (ref 0.00–0.06)

## 2010-07-30 LAB — POCT I-STAT, CHEM 8
Calcium, Ion: 1.08 mmol/L — ABNORMAL LOW (ref 1.12–1.32)
Creatinine, Ser: 0.5 mg/dL (ref 0.4–1.2)
Glucose, Bld: 249 mg/dL — ABNORMAL HIGH (ref 70–99)
HCT: 40 % (ref 36.0–46.0)
Hemoglobin: 13.6 g/dL (ref 12.0–15.0)
Potassium: 3.2 mEq/L — ABNORMAL LOW (ref 3.5–5.1)
TCO2: 26 mmol/L (ref 0–100)

## 2010-07-30 LAB — GLUCOSE, CAPILLARY
Glucose-Capillary: 208 mg/dL — ABNORMAL HIGH (ref 70–99)
Glucose-Capillary: 142 mg/dL — ABNORMAL HIGH (ref 70–99)
Glucose-Capillary: 158 mg/dL — ABNORMAL HIGH (ref 70–99)
Glucose-Capillary: 196 mg/dL — ABNORMAL HIGH (ref 70–99)
Glucose-Capillary: 199 mg/dL — ABNORMAL HIGH (ref 70–99)

## 2010-07-30 LAB — DIFFERENTIAL
Basophils Absolute: 0 K/uL (ref 0.0–0.1)
Basophils Absolute: 0 10*3/uL (ref 0.0–0.1)
Basophils Relative: 0 % (ref 0–1)
Basophils Relative: 1 % (ref 0–1)
Eosinophils Absolute: 0.1 K/uL (ref 0.0–0.7)
Eosinophils Absolute: 0.1 10*3/uL (ref 0.0–0.7)
Eosinophils Relative: 3 % (ref 0–5)
Lymphocytes Relative: 33 % (ref 12–46)
Lymphs Abs: 1.8 K/uL (ref 0.7–4.0)
Monocytes Absolute: 0.7 K/uL (ref 0.1–1.0)
Monocytes Relative: 12 % (ref 3–12)
Neutro Abs: 2.8 K/uL (ref 1.7–7.7)
Neutrophils Relative %: 52 % (ref 43–77)
Neutrophils Relative %: 45 % (ref 43–77)

## 2010-07-30 LAB — CK TOTAL AND CKMB (NOT AT ARMC)
CK, MB: 1.5 ng/mL (ref 0.3–4.0)
Relative Index: INVALID (ref 0.0–2.5)
Total CK: 99 U/L (ref 7–177)

## 2010-07-30 LAB — URINALYSIS, ROUTINE W REFLEX MICROSCOPIC
Bilirubin Urine: NEGATIVE
Glucose, UA: 100 mg/dL — AB
Ketones, ur: NEGATIVE mg/dL
Nitrite: NEGATIVE
Protein, ur: >300 mg/dL — AB
Specific Gravity, Urine: 1.013 (ref 1.005–1.030)
Urobilinogen, UA: 0.2 mg/dL (ref 0.0–1.0)
pH: 6 (ref 5.0–8.0)

## 2010-07-30 LAB — COMPREHENSIVE METABOLIC PANEL
ALT: 14 U/L (ref 0–35)
Alkaline Phosphatase: 104 U/L (ref 39–117)
BUN: 8 mg/dL (ref 6–23)
CO2: 26 mEq/L (ref 19–32)
GFR calc non Af Amer: >60 mL/min (ref >60)
Glucose, Bld: 210 mg/dL — ABNORMAL HIGH (ref 70–99)
Potassium: 3.4 mEq/L — ABNORMAL LOW (ref 3.5–5.1)
Sodium: 141 mEq/L (ref 135–145)

## 2010-07-30 LAB — LIPID PANEL
Triglycerides: 95 mg/dL (ref <150)
VLDL: 19 mg/dL (ref 0–40)

## 2010-07-30 LAB — URINE MICROSCOPIC-ADD ON

## 2010-07-30 LAB — URINE CULTURE: Colony Count: >=100000

## 2010-07-30 LAB — POCT CARDIAC MARKERS
CKMB, poc: 1.2 ng/mL (ref 1.0–8.0)
Myoglobin, poc: 75 ng/mL (ref 12–200)
Troponin i, poc: <0.05 ng/mL (ref 0.00–0.09)

## 2010-07-30 LAB — APTT: aPTT: 61 s — ABNORMAL HIGH (ref 24–37)

## 2010-07-30 LAB — TROPONIN I: Troponin I: 0.03 ng/mL (ref 0.00–0.06)

## 2010-09-08 NOTE — Assessment & Plan Note (Signed)
[place] OFFICE NOTE   Glenda, [name]                      MRN:          [id number]  DATE:11/14/2007                            DOB:          01-Sep-1933    Glenda [name] comes in today for further management of nonobstructive  coronary artery disease, hypertension.   She had to get 2 great-grandchildren ready this morning and thinks that  is why her blood pressure is high.  Unfortunately, her blood pressure  was high on last visit.  She forgot to take her medications.  She  assures me that her blood pressure runs around 130 when she checks it at  home.   She is having no angina or ischemic symptoms.  She denies any orthopnea,  PND, or peripheral edema.  Her biggest complaint is her arthritic pain,  for which she takes p.r.n. Advil.  Dr. Carlis Abbott is following her blood  work.   Her meds are unchanged since last year except her Lipitor has been  changed to simvastatin 20 mg a day due to money problems.  Again, Dr.  Carlis Abbott is checking her blood work.   PHYSICAL EXAMINATION:  VITAL SIGNS:  Her blood pressure today is 198/83,  her pulse is 72 and regular.  Her weight is 176.  HEENT:  Unremarkable and unchanged.  NECK:  Carotid upstrokes are equal bilaterally without bruits.  No JVD.  Thyroid is not enlarged.  Trachea is midline.  LUNGS:  Clear.  HEART:  Regular rate and rhythm.  ABDOMEN:  Soft.  Good bowel sounds.  EXTREMITIES:  No edema.  Pulses are present.   EKG shows sinus rhythm with a short PR interval.  There has been no  change.   Glenda [name] seems to be stable except I am worried about her blood  pressure.  Again, she reassurance me it is normal at home.  She is on a  good program.  I will see her back in a year.     Thomas C. Verl Blalock, MD, [facility]  Electronically Signed    TCW/MedQ  DD: 11/14/2007  DT: 11/15/2007  Job #: [id number]

## 2010-09-08 NOTE — Assessment & Plan Note (Signed)
[place] OFFICE NOTE   BRYTON, DILEONARDO                      MRN:          [id number]  DATE:11/23/2006                            DOB:          [birth year]    REASON FOR VISIT:  [name] comes in today for further management of  the following issues:  1. Nonobstructive coronary artery disease by cardiac catheterization      1999.  She is having no symptoms of angina or ischemia.  Last      stress Myoview was June 09, 2004.  She had poor exercise      tolerance, very exaggerated blood pressure response to exercise but      no ischemia.  Ejection fraction was 69%.  2. Hypertension.  I suspect this is poorly controlled.  She had fat      back that a neighbor brought her this morning but did take her      medications.  She says that is why her pressure is high today.      Looking back through the chart she has been either borderline or      high each visit.  She is on a very aggressive medical program but I      am not sure about compliance, certainly with diet.  I will let Dr.      Carlis Abbott follow up with this.  3. Hyperlipidemia.   MEDICATIONS:  1. Humalog as directed.  2. Nexium 40 mg daily.  3. Metoprolol succinate 50 mg daily.  4. Amlodipine/Benazepril 10/20 daily.  5. Hyzaar 100/25 daily.  6. Lipitor 10 mg q.h.s.  7. Aspirin 81 mg daily.   PHYSICAL EXAMINATION:  VITAL SIGNS:  Her blood pressure today is 167/74,  pulse 68 and regular, her weight is 169.  HEENT:  Normocephalic, atraumatic.  Pupils equal, round, reactive to  light and accommodation.  Extraocular movements intact. Sclerae clear.  Facial symmetry is normal.  NECK:  Supple.  Carotids are full without bruits.  There is no JVD.  Thyroid is not enlarged.  Trachea is midline.  LUNGS:  Clear.  HEART:  Reveals a nondisplaced PMI.  She has an S4.  Normal S1 and S2.  ABDOMEN:  Soft, good bowel sounds. Organomegaly cannot be assessed.  EXTREMITIES:  Reveal no edema.  Pulses are present.  NEUROLOGICAL:  Exam is intact.   CLINICAL DATA:  EKG sinus rhythm, nonspecific ST segment changes.  No  change from before.   ASSESSMENT/PLAN:  Ms. Pahls has ongoing major risk factors for  progressive cardiovascular disease and the risk of stroke.  She is also  diabetic, not mentioned above.   PLAN:  1. Adenosine rest stress Myoview to rule out progressive coronary      disease.  2. Compliance with diet emphasized.  3. Continue current medications.  4. Followup blood pressure and blood work with Dr. Carlis Abbott.  5. I will plan on seeing her back in one year if things are stable      with her stress test.     Marcello Moores C. Wall, MD, [facility]  Electronically Signed    TCW/MedQ  DD: 11/23/2006  DT: 11/24/2006  Job #: [id number]   cc:   Jeanann Lewandowsky, M.D.

## 2010-09-11 NOTE — Op Note (Signed)
NAME:  Glenda [name], Glenda [name]                         ACCOUNT NO.:  [id number]   MEDICAL RECORD NO.:  [id number]                   PATIENT TYPE:  AMB   LOCATION:  ENDO                                 FACILITY:  [facility]   PHYSICIAN:  Nelwyn Salisbury, M.D.               DATE OF BIRTH:  Feb 07, 1934   DATE OF PROCEDURE:  08/07/2003  DATE OF DISCHARGE:                                 OPERATIVE REPORT   PROCEDURE PERFORMED:  Colonoscopy with snare polypectomy x3.   ENDOSCOPIST:  Nelwyn Salisbury, M.D.   INSTRUMENT USED:  Olympus video colonoscope.   INDICATION FOR PROCEDURE:  A 77 year old African-American female with a  family history of colon cancer in her father undergoing a colonoscopy.  Rule  out colonic polyps, masses, etc.   PREPROCEDURE PREPARATION:  Informed consent was procured from the patient.  The patient was fasted for eight hours prior to the procedure and prepped  with a bottle of magnesium citrate and a gallon of GoLYTELY the night prior  to the procedure.   PREPROCEDURE PHYSICAL:  VITAL SIGNS:  The patient had stable vital signs.  NECK:  Supple.  CHEST:  Clear to auscultation.  S1, S2 regular.  ABDOMEN:  Soft with normal bowel sounds.   DESCRIPTION OF PROCEDURE:  The patient was placed in the left lateral  decubitus position and sedated with Demerol and Versed for the EGD.  No  additional sedation was used for the colonoscopy.  Once the patient was  adequately sedate and maintained on low-flow oxygen and continuous cardiac  monitoring, the Olympus video colonoscope was advanced from the rectum to  the cecum with difficulty.  There was a large amount of solid stool in  different parts of the colon.  The cecum was full of stool, and therefore  the cecal base was not visualized.  Three polyps were snared from the  transverse colon.  One was lost in stool, two of them were retrieved.  Visualization was inadequate.  Retroflexion in the rectum revealed no  abnormalities.   IMPRESSION:  1. Three polyps snared from the transverse colon, one lost in stool.  2. Large amount of residual stool in the colon, small lesions could have     been missed.   RECOMMENDATIONS:  1. Await pathology results.  2. Outpatient follow-up in the next two weeks for further recommendations.  3. Avoid nonsteroidals, including aspirin, for now.                                               Nelwyn Salisbury, M.D.    JNM/MEDQ  D:  08/07/2003  T:  08/07/2003  Job:  [id number]   cc:   Jeanann Lewandowsky, M.D.  [address], [place] [address]  Fax: [phone]

## 2010-09-11 NOTE — Op Note (Signed)
[facility]  Patient:    Glenda [name], Glenda [name]                      MRN: [id number] Proc. Date: 12/18/99 Adm. Date:  [id number] Attending:  Gomez Cleverly CC:         Don Broach. Carlis Abbott, M.D.                           Operative Report  PREOPERATIVE DIAGNOSES:  Acute acalculous cholecystitis.  POSTOPERATIVE DIAGNOSES:  Acute acalculous cholecystitis.  OPERATION:  Laparoscopic cholecystectomy.  SURGEON:  Earnstine Regal, M.D.  ASSISTANT: Excell Seltzer, M.D.  ANESTHESIA:  General per Freddie Apley, M.D.  ESTIMATED BLOOD LOSS:  Less than 100 cc.  PREPARATION:  Betadine.  COMPLICATIONS:  None.  DRAINS:  39 [place] Blake drain to gallbladder bed.  INDICATIONS:  The patient is a 77 year old black female admitted on December 17, 1999, by Dr. Annabell Sabal for abdominal pain.  She underwent work-up which demonstrated a leukocytosis.  Ultrasound did not show gallstones.  A PIPIDA scan, however, was performed and showed nonvisualization of the gallbladder consistent with acute cholecystitis.  Physical examination was consistent with acute cholecystitis in this diabetic patient.  The patient was now brought to the operating room for cholecystectomy.  DESCRIPTION OF PROCEDURE:  The procedure was done in operating room #2 at the [facility].  The patient was brought to the operating room and placed in the supine position on the operating room table.  Following administration of general anesthesia, the patient was prepped and draped in the usual strict aseptic fashion.  After ascertaining that an adequate level of anesthesia had been obtained, an infraumbilical incision was made with a #15 blade.  Dissection was carried down through the subcutaneous tissues. The fascia was incised in the midline.  The peritoneal cavity was entered cautiously.  An 0 Vicryl pursestring suture was placed in the fascia.  A Hasson cannula was introduced  under direct vision and secured with a pursestring suture.  The abdomen was insufflated with carbon dioxide.  The laparoscope was introduced under direct vision and the abdomen explored.  Ther is an obvious phlegmon in the right upper quadrant.  The omentum is adherent to the gallbladder and liver.  Operative ports were placed along the right costal margin within the midline, midclavicular line and anterior axillary line.  The omentum was bluntly dissected off of an obviously inflamed and partially necrotic gallbladder.  The gallbladder was aspirated with an aspirating trocar.  It does not appear to contain gallstones.  The gallbladder was grasped and retracted cephalad.  Careful dissection was begun at the neck of the gallbladder.  The peritoneum was incised.  The cystic duct is dissected out along its length, doubly clipped and divided.  The cystic artery is dissected out along its length, doubly clipped and divided.  The gallbladder is then excised from the gallbladder bed with some difficulty using the electrocautery for hemostasis.  Posteriorly, the wall of the gallbladder is largely necrotic.   The gallbladder is completely excised.  The right upper quadrant is irrigated.  The gallbladder bed is addressed with the electrocautery to achieve hemostasis.  The gallbladder bed is packed with a large seat of Surgicel.  A #19 Blake drain is brought in through the right lateral stab wound and placed into the gallbladder bed.  The drain is secured to the skin with a 3-0 nylon suture  and placed to bulb suction.  The gallbladder was placed through an Endocatch bag and withdrawn through the umbilical port.  The 0 Vicryl pursestring suture was tied securely.  The right upper quadrant is copiously irrigated and fluid evacuated.  Good hemostasis was noted.  Ports are removed under direct vision.  The pneumoperitoneum is released.  All four sites are anesthetized with local anesthetic.  The  three remaining open wounds were closed with interrupted 4-0 Vicryl subcuticular sutures.  The wounds were washed and dried, and Benzoin and Steri-Strips are applied.  Sterile gauze dressings were applied.  The patient was transferred to the recovery room in stable condition.  The patient tolerated the procedure well. DD:  12/18/99 TD:  12/21/99 Job: [id number]

## 2010-09-11 NOTE — Cardiovascular Report (Signed)
Glenda [name], Glenda [name] NO.:  [id number]   MEDICAL RECORD NO.:  [id number]                   PATIENT TYPE:  INP   LOCATION:  2856                                 FACILITY:  [facility]   PHYSICIAN:  Loretha Brasil. [name], M.D.             DATE OF BIRTH:  July 07, 1933   DATE OF PROCEDURE:  10/08/2002  DATE OF DISCHARGE:                              CARDIAC CATHETERIZATION   INDICATIONS:  The patient is a 77 year old female who has a long family  history of diabetes and who is also an insulin-dependent diabetic herself.  She currently presents with some recurrent chest pain.  She was catheterized  in 1999 and at that time did not have critical disease.  She had mild  disease of the LAD and was treated medically.  She presents with recurrent  chest pain and is now brought back to the laboratory for further evaluation.  I interviewed the patient and her daughter prior to the procedure and risks  and benefits were discussed.   PROCEDURE:  1. Left heart catheterization.  2. Selective coronary arteriography.  3. Selective left ventriculography.   DESCRIPTION OF PROCEDURE:  The procedure was performed from the femoral  artery using 6-French catheters.  She tolerated the procedure without  complication.  She was taken to the holding area in satisfactory clinical  condition.  She had been initially consented for the Matrix trial and a  femoral angiogram revealed an appropriate femoral entry site with a  moderately small femoral artery.   HEMODYNAMIC DATA:  1. Central aorta 169/71, mean 110.  2. Left ventricle 153/7.  3. No aortic to left ventricular gradient on pullback across the aortic     valve.   ANGIOGRAPHIC DATA:  1. Ventriculography was performed in the RAO projection.  Overall systolic     function was well preserved and no segmental abnormalities or contraction     were identified.  There was near cavity obliteration.  No significant     mitral  regurgitation was noted.  2. There was no obvious calcification of the coronary vessels.  3. The left main coronary artery was fairly large and free of critical     disease.  4. The LAD coursed to the apex, wrapped the apex, and supplied a significant     portion of the distal inferior wall.  There was a tiny first diagonal     that was free of critical disease.  The proximal and mid LAD appeared to     be without significant focal narrowing.  The LAD after the major diagonal     which was the second diagonal demonstrates a segmental plaque of about 50-     60%.  This was previously graded at 30, but appeared to be only mildly     progressed from the previous study.  There is also a diagonal stenosis     that involves likely the bifurcation  and is about 50-60% as well.  The     diagonal is moderate in size.  5. The circumflex provides a tiny first marginal branch that has about a 50%     area of narrowing but it is very small.  There is a second large marginal     branch which bifurcates twice in its distal course.  It is a large     caliber vessel and without critical disease.  The AV circumflex has a     segmental area of about 60-70% right after the takeoff of a large major     diagonal branch and that is graded a 60-70%.  The distal termination of     this is a smaller posterolateral branch which thus supplies a fairly     small area of myocardium.  6. The right coronary artery is a small caliber vessel being 2 mm or less in     size.  There is about a 30-40% area of plaquing in the first bend between     the proximal and mid vessel.  There is probably 40% mid narrowing and in     the distal vessel some mild luminal irregularity.  The terminal branches     which consist of a posterior descending and several posterolateral     branches are relatively small in caliber.   CONCLUSIONS:  1. Hypodynamic left ventricular function.  2. Scattered disease of the left anterior descending and  diagonal as noted     on previous study.  3. Moderate obstruction of the AV circumflex after the large marginal     takeoff.  4. Scattered irregularities of the right coronary artery as described above.   DISPOSITION:  The initial plan would be to treat the patient medically.  It  is unclear which, if any, of these lesions might be causing significant  angina.  We plan to check a D-dimer.  We also plan to give the patient  medical therapy.  She will be followed by Jeanann Lewandowsky, M.D. and either  Junious Silk, M.D. [facility] or Loretha Brasil. [name], M.D.                                               Loretha Brasil. [name], M.D.    TDS/MEDQ  D:  10/08/2002  T:  10/08/2002  Job:  [id number]   cc:   Jeanann Lewandowsky, M.D.  [address], [place] [address]  Fax: [phone]   Junious Silk, M.D. [facility]   Dola Argyle, M.D.   [facility]

## 2010-09-11 NOTE — Op Note (Signed)
NAME:  Glenda [name], Glenda [name]             ACCOUNT NO.:  [id number]   MEDICAL RECORD NO.:  [id number]          PATIENT TYPE:  AMB   LOCATION:  ENDO                         FACILITY:  [facility]   PHYSICIAN:  Nelwyn Salisbury, M.D.  DATE OF BIRTH:  [birth year]   DATE OF PROCEDURE:  02/12/2004  DATE OF DISCHARGE:                                 OPERATIVE REPORT   PROCEDURE PERFORMED:  Screening colonoscopy.   ENDOSCOPIST:  Juanita Craver, M.D.   INSTRUMENT USED:  Olympus video colonoscope.   INDICATIONS FOR PROCEDURE:  The patient is a 77 year old African-American  female with a family history of colon cancer in her father and a personal  history of a tubular adenoma removed in the past undergoing a screening  colonoscopy to rule out colonic polyps, masses, etc.   PREPROCEDURE PREPARATION:  Informed consent was procured from the patient.  The patient was fasted for eight hours prior to the procedure and prepped  with a bottle of magnesium citrate and a gallon of GoLYTELY the night prior  to the procedure.   PREPROCEDURE PHYSICAL:  The patient had stable vital signs.  Neck supple.  Chest clear to auscultation.  S1 and S2 regular.  Abdomen soft with normal  bowel sounds.   DESCRIPTION OF PROCEDURE:  The patient was placed in left lateral decubitus  position and sedated with 50 mg of Demerol and 5 mg of Versed in slow  incremental doses.  Once the patient was adequately sedated and maintained  on low flow oxygen and continuous cardiac monitoring, the Olympus video  colonoscope was advanced from the rectum to the cecum.  There was some  residual stool in the colon, multiple washes were done, a few early sigmoid  diverticula were seen, no masses or polyps were identified.  The terminal  ileum appeared healthy and without lesions.  The appendicular orifice and  ileocecal valve were normal as well.   IMPRESSION:  1.  Normal colonoscopy to the terminal ileum except for a few early sigmoid  diverticula.  2.  No masses or polyps seen.   RECOMMENDATIONS:  1.  Continue high fiber diet with liberal fluid intake.  2.  Repeat colonoscopy in the next five years or earlier if the patient      developed any abnormal symptoms in the interim.  3.  Outpatient followup in the next two weeks as need arises.       JNM/MEDQ  D:  02/12/2004  T:  02/12/2004  Job:  [id number]   cc:   Jeanann Lewandowsky, M.D.  [address] [place]  [place] [address]  Fax: [phone]

## 2010-09-11 NOTE — Discharge Summary (Signed)
[facility]  Patient:    Glenda [name], Glenda [name]                      MRN: [id number] Adm. Date:  [id number] Disc. Date: [id number] Attending:  Gomez Cleverly CC:         Don Broach. Carlis Abbott, M.D.   Discharge Summary  REASON FOR ADMISSION:  Abdominal pain.  HISTORY OF PRESENT ILLNESS:  The patient is a 77 year old black female admitted from the emergency department by Dr. Lennie Hummer with abdominal pain in the right upper quadrant.  This had been going on for approximately 48 hours prior to admission.  The patient was admitted and underwent workup including a PIPIDA scan, which demonstrated nonvisualization of the gallbladder.  The patient had been placed on intravenous antibiotics.  She was seen and evaluated again on the morning following admission and prepared for surgery.  HOSPITAL COURSE:  The patient was seen, evaluated, and prepared for the operating room on the evening of December 18, 1999.  She was taken to the operating room where she underwent laparoscopic cholecystectomy for acute acalculous cholecystitis.  Postoperative course was relatively straightforward. The patient was initially started on a clear liquid diet. She was continued on intravenous antibiotics.  She was seen by Dr. Jeanann Lewandowsky for management of her diabetes.  The patient made gradual progress.  She had gradual resolution of her abdominal pain.  White blood cell count slowly decreased back towards a normal range.  Fevers slowly resolved.  The patient had had multiple blood cultures drawn postoperatively for persistent fever, all of which were negative.  The patient was changed from intravenous Unasyn to intravenous Primaxin.  A follow-up CT scan of the abdomen was obtained, which showed some fluid around the liver and in the gallbladder bed, but no significant collections and no abscess formation.  The patient continued to make gradual progress, with slowly resolving fevers.  The  patient was prepared for discharge home on December 27, 1999.  DISCHARGE PLAN:  The patient is discharged home December 27, 1999, in good condition, tolerating a regular diet, and ambulating independently.  She has an open wound at the umbilicus which is requiring wet-to-dry dressing changes twice daily with the assistance of a home health nurse.  The patient will take oral ciprofloxacin for five days after discharge.  FOLLOW-UP:  She will be seen back at my office at [facility] in five days.  FINAL DIAGNOSES: 1. Acute acalculous cholecystitis. 2. Diabetes mellitus.  CONDITION ON DISCHARGE:  Improved. DD:  01/12/00 TD:  01/13/00 Job: [id number]

## 2010-09-11 NOTE — Op Note (Signed)
NAME:  Glenda [name], Glenda [name]                         ACCOUNT NO.:  [id number]   MEDICAL RECORD NO.:  [id number]                   PATIENT TYPE:  AMB   LOCATION:  ENDO                                 FACILITY:  [facility]   PHYSICIAN:  Nelwyn Salisbury, M.D.               DATE OF BIRTH:  21-Nov-1933   DATE OF PROCEDURE:  07/03/2003  DATE OF DISCHARGE:                                 OPERATIVE REPORT   PROCEDURE PERFORMED:  Esophagogastroduodenoscopy.   ENDOSCOPIST:  Nelwyn Salisbury, M.D.   INSTRUMENT USED:  Olympus video panendoscope.   INDICATION FOR PROCEDURE:  Severe dysphagia with a high-grade stricture seen  on barium swallow in a 77 year old African-American female.  Dilatation  planned if needed.   PREPROCEDURE PREPARATION:  Informed consent was procured from the patient.  The patient was fasted for eight hours prior to the procedure.   PREPROCEDURE PHYSICAL:  VITAL SIGNS:  The patient had stable vital signs.  NECK:  Supple.  CHEST:  Clear to auscultation.  S1, S2 regular.  ABDOMEN:  Soft with normal bowel sounds.   DESCRIPTION OF PROCEDURE:  The patient was placed in the left lateral  decubitus position and sedated with 40 mg of Demerol and 4 mg of Versed in  slow incremental doses.  Once the patient was adequately sedate and  maintained on low-flow oxygen and continuous cardiac monitoring, the Olympus  video panendoscope was advanced through the mouthpiece, over the tongue,  into the esophagus under direct vision.  There was severe grade 4 distal  esophagitis with stricturing at the Z-line.  There was significant  difficulty in passing the scope from the distal esophagus into the stomach  with friability and bleeding noted at this area.  Retroflexion in the high  cardia revealed a hiatal hernia.  There was mild diffuse gastritis.  The  antrum appeared normal.  A prominent ampulla was noted.  No biopsies were  done of this area.   IMPRESSION:  1. Grade 4 distal esophagitis with  high-grade stricture, scope passed with     difficulty, friability, and bleeding noted.  2. Hiatal hernia.  3. Prominent ampulla.   RECOMMENDATIONS:  1. Prevacid SoluTab 30 mg b.i.d. has been advised along with Carafate slurry     1 g q.i.d.  2. Hold aspirin for now.  3. Soft diet for the next two weeks.  4. Outpatient follow-up in the next two weeks.  5. Repeat EGD in the next four to six weeks with plans to biopsy the ampulla     to rule out dysplasia versus malignancy.                                               Nelwyn Salisbury, M.D.    JNM/MEDQ  D:  07/03/2003  T:  07/04/2003  Job:  [id number]   cc:   Jeanann Lewandowsky, M.D.  [address] [place] [address]  Fax: [phone]

## 2010-09-11 NOTE — Discharge Summary (Signed)
Glenda [name], Glenda [name] NO.:  [id number]   MEDICAL RECORD NO.:  [id number]                   PATIENT TYPE:  INP   LOCATION:  [id number]                                 FACILITY:  [facility]   PHYSICIAN:  Dola Argyle, M.D.                  DATE OF BIRTH:  [birth year]   DATE OF ADMISSION:  10/08/2002  DATE OF DISCHARGE:  10/09/2002                           DISCHARGE SUMMARY - REFERRING   DISCHARGE DIAGNOSES:  1. Chest pain.     a. Cardiac catheterization this admission revealing moderate coronary        artery disease, normal left ventricle with no wall motion        abnormalities, and no significant mitral regurgitation.  Recommend        medical therapy.     b. D-dimer negative this admission.  2. Hypertension.  3. Diabetes mellitus, type 2.  4. Obesity.  5. Gastrointestinal reflux disease.  6. Acalculous cholecystitis.     a. Status post cholecystectomy in 2001.  7. Family history of coronary artery disease.   PROCEDURES PERFORMED THIS ADMISSION:  Cardiac catheterization by Glenda [name].  Glenda [name], M.D., on June 14, [id number]:  Overall systolic function well preserved  with no segmental abnormalities.  Left main free of disease.  LAD with a  tiny first diagonal free of critical disease, proximal and mid LAD without  significant focal narrowing, LAD after measuring diagonal with segmental  plaque of 50-60% (previously graded at 30% - mild progression from previous  study).  Diagonal stenosis with 50-60%.  Circumflex with tiny first marginal  branch with 50% narrowing.  AV circumflex with a segmental area of 60-70%.  RCA with a 30-40% area of plaquing between proximal and mid vessel.  There  is 40% mid narrowing and the distal vessel has some mild luminal  irregularities.   HOSPITAL COURSE:  Please see the dictated history and physical by Dr.  Haroldine [name] for complete details.  Briefly, this 77 year old female was  admitted in the early morning hours of October 08, 2002, with complaints of  sudden onset of central chest pain while awake.  She was admitted and placed  on aspirin, beta blocker, ACE inhibitor, and statin, as well as  nitroglycerin and heparin.  She was sent to the cardiac catheterization  laboratory later that day.  The results are noted above.  She tolerated the  procedure well and had no immediate complications.  Dr. Lia [name] recommended  initial medical therapy.  He saw her again on October 09, 2002.  He checked a D-  dimer and this was normal.  He felt that she could be discharged to home on  her regular home medicines with the addition of beta blocker and Lipitor 10  mg q.h.s.  On admission, some of the medicines were not started.  Therefore,  in addition to Vasotec and Norvasc, she will go home on Toprol XL 25  mg  daily in addition to the Lipitor.  She will need a fasting lipid panel and  LFT done next week as an outpatient.  She will follow up with the P.A. in  two weeks and then Dr. Vicenta [name] in the future.  She is an old Dr. Vicenta [name]  patient.   LABORATORY DATA:  White count 7000, hemoglobin 12.3, hematocrit 36.8,  platelet count 172,000.  INR 0.8.  D-dimer less than 0.22.  Sodium 140,  potassium 3.8, chloride 109, CO2 26, glucose 182, BUN 7, creatinine 0.8,  total bilirubin 0.7, alkaline phosphatase 94, AST 16, ALT 12, total protein  7.6, albumin 3.6, calcium 9.  Cardiac enzymes negative x 3.  Hemoglobin A1C  10.2.  The lipid panel is pending at the time of this dictation.   DISCHARGE MEDICATIONS:  1. Toprol XL 25 mg daily.  2. Lipitor 10 mg daily.  3. Aspirin 325 mg daily.  4. Vasotec 10 mg daily.  5. Norvasc 10 mg daily.  6. Lantus 30 units q.h.s.  7. Humulog-R twice daily.  8. Prilosec.  9. Nitroglycerin p.r.n. chest pain.   PAIN MANAGEMENT:  The patient is to use Tylenol as needed for groin pain and  nitroglycerin as needed for chest pain.  She is to call our office in  [place], [place], or [place] for  recurrent chest pain.   DIET:  Low-fat, low-sodium diet.   WOUND CARE:  The patient should call our office in [place], [place], for any groin swelling, bleeding, or bruising.   FOLLOWUP:  The patient will report to our office next week for LFTs, fasting  lipid panel, and blood pressure check.  She will see the P.A. for Dr.  Lia [name] on Monday, October 14, 2002, at 11 a.m. on the same day that Dr.  Lia [name] is there.  After seeing Dr. Lia [name], she will follow up with Dr.  Vicenta [name].     Richardson [name], P.A.                        Dola Argyle, M.D.    SW/MEDQ  D:  10/09/2002  T:  10/09/2002  Job:  [id number]   cc:   Glenda [name]. Glenda [name], M.D.   Jeanann Lewandowsky, M.D.  [address] [place] [address]  Fax: [phone]    cc:   Glenda [name]. Glenda [name], M.D.   Jeanann Lewandowsky, M.D.  [address] [place] [address]  Fax: [phone]

## 2010-09-11 NOTE — Op Note (Signed)
NAME:  Glenda [name], Glenda [name]                         ACCOUNT NO.:  [id number]   MEDICAL RECORD NO.:  [id number]                   PATIENT TYPE:  AMB   LOCATION:  ENDO                                 FACILITY:  [facility]   PHYSICIAN:  Nelwyn Salisbury, M.D.               DATE OF BIRTH:  Aug 04, 1933   DATE OF PROCEDURE:  08/07/2003  DATE OF DISCHARGE:                                 OPERATIVE REPORT   PROCEDURE PERFORMED:  Esophagogastroduodenoscopy with antral biopsies.   ENDOSCOPIST:  Nelwyn Salisbury, M.D.   INSTRUMENT USED:  Olympus video panendoscope.   INDICATION FOR PROCEDURE:  A 77 year old African-American female with a  history of persistent dysphagia and epigastric pain.  Rule out peptic ulcer  disease.  The patient had an EGD in the recent past with tight esophageal  stricture and severe distal esophagus, and therefore dilatation was not  done.   PREPROCEDURE PREPARATION:  Informed consent was procured from the patient.  The patient had fasted for eight hours prior to the procedure.   PREPROCEDURE PHYSICAL:  VITAL SIGNS:  The patient had stable vital signs.  NECK:  Supple.  CHEST:  Clear to auscultation.  S1, S2 regular.  ABDOMEN:  Soft with normal bowel sounds.   DESCRIPTION OF PROCEDURE:  The patient was placed in the left lateral  decubitus position and sedated with 50 mg of Demerol and 7 mg of Versed  intravenously.  Once the patient was adequately sedate and maintained on low-  flow oxygen and continuous cardiac monitoring, the Olympus video  panendoscope was advanced through the mouthpiece, over tongue, into the  esophagus under direct vision.  The proximal esophagus appeared normal.  There was grade 2 distal esophagitis with significant stricturing noted at  the GE junction.  The scope was gently manipulated into the stomach.  There  was some bleeding at the site of the stricture with the manipulation.  Severe diffuse gastritis was noted.  Biopsies were done from the  antrum to  rule out presence of H. pylori by pathology.  A small hiatal hernia was seen  on high retroflexion.  A prominent ampulla was seen in the proximal small  bowel, which was otherwise normal.   IMPRESSION:  1. Grade 2 distal esophagitis.  2. Stricture at gastroesophageal junction, dilated with passage of scope.  3. Severe diffuse gastritis, antral biopsies done for Helicobacter pylori.  4. Small hiatal hernia.  5. Prominent ampulla, otherwise normal proximal small bowel.   RECOMMENDATIONS:  1. Increase PPIs to twice a day.  2. Avoid nonsteroidals.  3. Soft diet for the next three to four days.  4. Proceed with a colonoscopy at this time.  Further recommendations will be     made thereafter.  Nelwyn Salisbury, M.D.    JNM/MEDQ  D:  08/07/2003  T:  08/07/2003  Job:  [id number]   cc:   Jeanann Lewandowsky, M.D.  [address] [place] [address]  Fax: [phone]

## 2010-09-11 NOTE — H&P (Signed)
NAME:  Glenda [name], Glenda [name] NO.:  [id number]   MEDICAL RECORD NO.:  [id number]                   PATIENT TYPE:  EMS   LOCATION:  ED                                   FACILITY:  [facility]   PHYSICIAN:  Glori Bickers, M.D. [facility]          DATE OF BIRTH:  23-Aug-1933   DATE OF ADMISSION:  10/08/2002  DATE OF DISCHARGE:                                HISTORY & PHYSICAL   PRIMARY CARE PHYSICIAN:  Jeanann Lewandowsky, M.D.   HISTORY OF PRESENT ILLNESS:  Ms. Twardzik is a very pleasant 77 year old black  female with a history of diabetes and hypertension who was admitted through  the [place] with unstable angina.   She reports a five day history of stuttering chest tightness radiating to  her left arm. She says the pain occurs approximately 3-4 times a day but is  released spontaneously after 5-10 minutes. It is not clearly related to  exertion. She thought is was probably indigestion so she took over the  counter Prilosec but did not get any relief. Early this morning had sudden  onset of central chest pain while she was awake and this radiated to her  left arm. The pain persisted so she came to the [facility] for further  evaluation. Her chest pain was initially relieved with nitroglycerin but  then recurred again and she was started on an IV nitroglycerin drip with  good relief. She denies any nausea, vomiting, shortness of breath, or  diaphoresis which accompanies her pain.   She reports having a normal cardiac catheterization approximately 5-10 years  ago but can not remember who performed it and has not followed up with a  cardiologist since that time.   In the [place], she was started on heparin, aspirin, metoprolol 5 mg  IV x2 and a nitroglycerin drip.   REVIEW OF SYMPTOMS:  She denies fever, chills, abdominal pain, CHF, cough,  dysuria, bright red blood per rectum or melena or neurologic symptoms.   PAST MEDICAL HISTORY:  1. Hypertension.  2. Obesity.  3. Diabetes.  4. Acalculous cholecystitis status post cholecystectomy in 2001.  5. GERD.   MEDICATIONS:  1. Vasotec 10 mg a day.  2. Norvasc 10 mg a day.  3. Aspirin 325 mg a day.  4. Lantus insulin 30 units q.h.s.  5. Humulog R  10 units with breakfast and 8 units with lunch.  6. Prilosec.   ALLERGIES:  She has no known drug allergies.   SOCIAL HISTORY:  She lives in [place], she is retired, she denies any  tobacco or alcohol.   FAMILY HISTORY:  She has 12 siblings, one sister who died of an MI at age  36.   PHYSICAL EXAMINATION:  GENERAL:  She is uncomfortable.  VITAL SIGNS:  She is afebrile. Blood pressure is 160/80, heart rate 80, CVP  is approximately 6 cm of water with positive CV waves.  Carotids are 2+  bilaterally without bruits.  CARDIAC:  Regular rate and rhythm with a normal S1 and S2, +S4. She has a  2/6 systolic ejection murmur at the left sternal murmur.  LUNGS:  Clear to auscultation.  ABDOMEN:  Obese but benign.  EXTREMITIES:  No cyanosis, clubbing or edema. Femoral pulses are 2+  bilaterally without bruits. DP pulses are 2+ bilaterally.   LABORATORY DATA:  She has a white count of 7.2, hematocrit of 43% and  platelets of 194. Her sodium is 139, potassium is 3.4 which was supplemented  in the ER. Her chloride is 109, her bicarb 27, BUN 8, creatinine 0.7,  glucose is 277. Her total CK is 117, MB 1.4, troponin is less than 0.01. Her  liver profile is within normal limits.   Her EKG shows normal sinus rhythm with a rate of 81. There is no ischemia.  Her chest x-ray has no acute disease.   ASSESSMENT/PLAN:  This is a 77 year old black female with multiple cardiac  risk factors who presents with a five day history of stuttering chest pain  concerning for unstable angina. She does report having a normal cardiac  catheterization approximately 5-10 years ago but given her symptom complex,  I think she certainly warrants repeat catheterization to  reevaluate her  coronary anatomy.   1. Admit to [facility] on telemetry to rule out myocardial infarction.  2. Cardiac catheterization.  3. Treat with aspirin, Betadine blocker, Ace inhibitor, statin,     nitroglycerin and heparin. If her cardiac markers turn positive, we will     add a 2D3A inhibitor such as Integrelin.  4. Check fasting lipid panel and hemoglobin A1C.  5. While in the hospital, we will decrease her insulin a bit and cover her     with a sliding scale insulin.                                               Glori Bickers, M.D. [facility]    DB/MEDQ  D:  10/08/2002  T:  10/08/2002  Job:  [id number]

## 2010-10-28 ENCOUNTER — Other Ambulatory Visit: Payer: Self-pay | Admitting: Cardiology

## 2010-11-20 ENCOUNTER — Encounter: Payer: Self-pay | Admitting: Cardiology

## 2010-11-25 ENCOUNTER — Ambulatory Visit (INDEPENDENT_AMBULATORY_CARE_PROVIDER_SITE_OTHER): Payer: Medicare HMO | Admitting: Cardiology

## 2010-11-25 ENCOUNTER — Encounter: Payer: Self-pay | Admitting: Cardiology

## 2010-11-25 VITALS — BP 130/62 | HR 71 | Resp 14 | Ht 66.0 in | Wt 174.0 lb

## 2010-11-25 DIAGNOSIS — E785 Hyperlipidemia, unspecified: Secondary | ICD-10-CM

## 2010-11-25 DIAGNOSIS — I5022 Chronic systolic (congestive) heart failure: Secondary | ICD-10-CM

## 2010-11-25 DIAGNOSIS — I5033 Acute on chronic diastolic (congestive) heart failure: Secondary | ICD-10-CM | POA: Insufficient documentation

## 2010-11-25 DIAGNOSIS — I2510 Atherosclerotic heart disease of native coronary artery without angina pectoris: Secondary | ICD-10-CM

## 2010-11-25 NOTE — Assessment & Plan Note (Signed)
Stable. No change in treatment. 

## 2010-11-25 NOTE — Patient Instructions (Signed)
Your physician recommends that you schedule a follow-up appointment in: 1 year with Dr. Verl Blalock

## 2010-11-25 NOTE — Assessment & Plan Note (Signed)
followup blood work in November.

## 2010-11-25 NOTE — Progress Notes (Signed)
HPI HPI [name] returns today for evaluation and management of coronary disease. She is having no angina or ischemic symptoms. Her blood work and follow Dr. Carlis Abbott.  EKG today shows normal sinus rhythm, normal EKG. Past Medical History  Diagnosis Date  . GERD (gastroesophageal reflux disease)   . Obesity   . DM (diabetes mellitus)   . HTN (hypertension)   . HLD (hyperlipidemia)   . CAD (coronary artery disease)   . UTI (urinary tract infection)   . Chest pain     Past Surgical History  Procedure Date  . Cholecystectomy   . Colon polyps; hx   . Coronary angioplasty 02/14/09    No family history on file.  History   Social History  . Marital Status: Widowed    Spouse Name: N/A    Number of Children: N/A  . Years of Education: N/A   Occupational History  . Not on file.   Social History Main Topics  . Smoking status: Never Smoker   . Smokeless tobacco: Not on file  . Alcohol Use: Not on file  . Drug Use: Not on file  . Sexually Active: Not on file   Other Topics Concern  . Not on file   Social History Narrative   Retired.     No Known Allergies  Current Outpatient Prescriptions  Medication Sig Dispense Refill  . amLODipine (NORVASC) 10 MG tablet Take 10 mg by mouth daily.        Marland Kitchen aspirin 81 MG EC tablet Take 81 mg by mouth daily.        . furosemide (LASIX) 40 MG tablet Take 80 mg by mouth as needed.       . insulin glargine (LANTUS) 100 UNIT/ML injection Inject 50 Units into the skin at bedtime.        . insulin NPH (NOVOLIN N) 100 UNIT/ML injection Inject into the skin. Sliding scale       . labetalol (NORMODYNE) 300 MG tablet Take 300 mg by mouth 2 (two) times daily.        Marland Kitchen losartan-hydrochlorothiazide (HYZAAR) 100-25 MG per tablet Take 1 tablet by mouth daily.        Marland Kitchen omeprazole (PRILOSEC) 20 MG capsule Take 20 mg by mouth daily.        . potassium chloride (KLOR-CON) 10 MEQ CR tablet Take 10 mEq by mouth daily.        Marland Kitchen PRAVACHOL 80 MG tablet TAKE  (1) TABLET BY MOUTH AT BEDTIME FORCHOLESTEROL.  30 each  1  . traMADol (ULTRAM) 50 MG tablet Take 50 mg by mouth every 6 (six) hours as needed.          ROS Negative other than HPI.   PE General Appearance: well developed, well nourished in no acute distress, obese HEENT: symmetrical face, PERRLA, good dentition  Neck: no JVD, thyromegaly, or adenopathy, trachea midline Chest: symmetric without deformity Cardiac: PMI non-displaced, RRR, normal S1, S2, no gallop or murmur Lung: clear to ausculation and percussion Vascular: all pulses full without bruits  Abdominal: nondistended, nontender, good bowel sounds, no HSM, no bruits Extremities: no cyanosis, clubbing or edema, no sign of DVT, no varicosities  Skin: normal color, no rashes Neuro: alert and oriented x 3, non-focal Pysch: normal affect Filed Vitals:   11/25/10 1033  BP: 130/62  Pulse: 71  Resp: 14  Height: 5\' 6"  (1.676 m)  Weight: 174 lb (78.926 kg)    EKG  Labs and Studies Reviewed.  Lab Results  Component Value Date   WBC 5.4 03/16/2009   HGB 12.6 04/26/2009   HCT 37.0 04/26/2009   MCV 82.7 03/16/2009   PLT 200 03/16/2009      Chemistry      Component Value Date/Time   NA 144 04/26/2009 1701   K 4.1 04/26/2009 1701   CL 111 04/26/2009 1701   CO2 25 03/09/2009 0520   BUN 14 04/26/2009 1701   CREATININE 0.9 04/26/2009 1701      Component Value Date/Time   CALCIUM 8.8 03/09/2009 0520   ALKPHOS 115 03/07/2009 0450   AST 31 03/07/2009 0450   ALT 20 03/07/2009 0450   BILITOT 0.6 03/07/2009 0450       Lab Results  Component Value Date   CHOL  Value: 88        ATP III CLASSIFICATION:  <200     mg/dL   Desirable  200-239  mg/dL   Borderline High  >=240    mg/dL   High        02/15/2009   CHOL 125 10/17/2008   Lab Results  Component Value Date   HDL 43 02/15/2009   HDL 58 10/17/2008   Lab Results  Component Value Date   LDLCALC  Value: 26        Total Cholesterol/HDL:CHD Risk Coronary Heart Disease Risk Table                      Men   Women  1/2 Average Risk   3.4   3.3  Average Risk       5.0   4.4  2 X Average Risk   9.6   7.1  3 X Average Risk  23.4   11.0        Use the calculated Patient Ratio above and the CHD Risk Table to determine the patient's CHD Risk.        ATP III CLASSIFICATION (LDL):  <100     mg/dL   Optimal  100-129  mg/dL   Near or Above                    Optimal  130-159  mg/dL   Borderline  160-189  mg/dL   High  >190     mg/dL   Very High 02/15/2009   LDLCALC 31 10/17/2008   Lab Results  Component Value Date   TRIG 95 02/15/2009   TRIG 178 10/17/2008   Lab Results  Component Value Date   CHOLHDL 2.0 02/15/2009   Lab Results  Component Value Date   HGBA1C  Value: 12.4 (NOTE) The ADA recommends the following therapeutic goal for glycemic control related to Hgb A1c measurement: Goal of therapy: <6.5 Hgb A1c  Reference: American Diabetes Association: Clinical Practice Recommendations 2010, Diabetes Care, 2010, 33: (Suppl  1).* 03/07/2009   Lab Results  Component Value Date   ALT 20 03/07/2009   AST 31 03/07/2009   ALKPHOS 115 03/07/2009   BILITOT 0.6 03/07/2009   Lab Results  Component Value Date   TSH 1.020 03/16/2009   Past Medical History  Diagnosis Date  . GERD (gastroesophageal reflux disease)   . Obesity   . DM (diabetes mellitus)   . HTN (hypertension)   . HLD (hyperlipidemia)   . CAD (coronary artery disease)   . UTI (urinary tract infection)   . Chest pain     Past Surgical History  Procedure Date  . Cholecystectomy   .  Colon polyps; hx   . Coronary angioplasty 02/14/09    No family history on file.  History   Social History  . Marital Status: Widowed    Spouse Name: N/A    Number of Children: N/A  . Years of Education: N/A   Occupational History  . Not on file.   Social History Main Topics  . Smoking status: Never Smoker   . Smokeless tobacco: Not on file  . Alcohol Use: Not on file  . Drug Use: Not on file  . Sexually Active: Not  on file   Other Topics Concern  . Not on file   Social History Narrative   Retired.     No Known Allergies  Current Outpatient Prescriptions  Medication Sig Dispense Refill  . amLODipine (NORVASC) 10 MG tablet Take 10 mg by mouth daily.        Marland Kitchen aspirin 81 MG EC tablet Take 81 mg by mouth daily.        . furosemide (LASIX) 40 MG tablet Take 80 mg by mouth as needed.       . insulin glargine (LANTUS) 100 UNIT/ML injection Inject 50 Units into the skin at bedtime.        . insulin NPH (NOVOLIN N) 100 UNIT/ML injection Inject into the skin. Sliding scale       . labetalol (NORMODYNE) 300 MG tablet Take 300 mg by mouth 2 (two) times daily.        Marland Kitchen losartan-hydrochlorothiazide (HYZAAR) 100-25 MG per tablet Take 1 tablet by mouth daily.        Marland Kitchen omeprazole (PRILOSEC) 20 MG capsule Take 20 mg by mouth daily.        . potassium chloride (KLOR-CON) 10 MEQ CR tablet Take 10 mEq by mouth daily.        Marland Kitchen PRAVACHOL 80 MG tablet TAKE (1) TABLET BY MOUTH AT BEDTIME FORCHOLESTEROL.  30 each  1  . traMADol (ULTRAM) 50 MG tablet Take 50 mg by mouth every 6 (six) hours as needed.          ROS Negative other than HPI.   PE  Filed Vitals:   11/25/10 1033  BP: 130/62  Pulse: 71  Resp: 14  Height: 5\' 6"  (1.676 m)  Weight: 174 lb (78.926 kg)    EKG  Labs and Studies Reviewed.   Lab Results  Component Value Date   WBC 5.4 03/16/2009   HGB 12.6 04/26/2009   HCT 37.0 04/26/2009   MCV 82.7 03/16/2009   PLT 200 03/16/2009      Chemistry      Component Value Date/Time   NA 144 04/26/2009 1701   K 4.1 04/26/2009 1701   CL 111 04/26/2009 1701   CO2 25 03/09/2009 0520   BUN 14 04/26/2009 1701   CREATININE 0.9 04/26/2009 1701      Component Value Date/Time   CALCIUM 8.8 03/09/2009 0520   ALKPHOS 115 03/07/2009 0450   AST 31 03/07/2009 0450   ALT 20 03/07/2009 0450   BILITOT 0.6 03/07/2009 0450       Lab Results  Component Value Date   CHOL  Value: 88        ATP III CLASSIFICATION:  <200      mg/dL   Desirable  200-239  mg/dL   Borderline High  >=240    mg/dL   High        02/15/2009   CHOL 125 10/17/2008   Lab Results  Component  Value Date   HDL 43 02/15/2009   HDL 58 10/17/2008   Lab Results  Component Value Date   LDLCALC  Value: 26        Total Cholesterol/HDL:CHD Risk Coronary Heart Disease Risk Table                     Men   Women  1/2 Average Risk   3.4   3.3  Average Risk       5.0   4.4  2 X Average Risk   9.6   7.1  3 X Average Risk  23.4   11.0        Use the calculated Patient Ratio above and the CHD Risk Table to determine the patient's CHD Risk.        ATP III CLASSIFICATION (LDL):  <100     mg/dL   Optimal  100-129  mg/dL   Near or Above                    Optimal  130-159  mg/dL   Borderline  160-189  mg/dL   High  >190     mg/dL   Very High 02/15/2009   LDLCALC 31 10/17/2008   Lab Results  Component Value Date   TRIG 95 02/15/2009   TRIG 178 10/17/2008   Lab Results  Component Value Date   CHOLHDL 2.0 02/15/2009   Lab Results  Component Value Date   HGBA1C  Value: 12.4 (NOTE) The ADA recommends the following therapeutic goal for glycemic control related to Hgb A1c measurement: Goal of therapy: <6.5 Hgb A1c  Reference: American Diabetes Association: Clinical Practice Recommendations 2010, Diabetes Care, 2010, 33: (Suppl  1).* 03/07/2009   Lab Results  Component Value Date   ALT 20 03/07/2009   AST 31 03/07/2009   ALKPHOS 115 03/07/2009   BILITOT 0.6 03/07/2009   Lab Results  Component Value Date   TSH 1.020 03/16/2009

## 2010-12-25 ENCOUNTER — Other Ambulatory Visit: Payer: Self-pay | Admitting: Cardiology

## 2010-12-29 ENCOUNTER — Ambulatory Visit (HOSPITAL_COMMUNITY)
Admission: RE | Admit: 2010-12-29 | Discharge: 2010-12-29 | Disposition: A | Discharge status: Home / Self Care | Payer: Medicare HMO | Source: Ambulatory Visit | Attending: Internal Medicine | Admitting: Internal Medicine

## 2010-12-29 ENCOUNTER — Other Ambulatory Visit: Payer: Self-pay | Admitting: Internal Medicine

## 2010-12-29 DIAGNOSIS — M25551 Pain in right hip: Secondary | ICD-10-CM

## 2010-12-29 DIAGNOSIS — M545 Low back pain, unspecified: Secondary | ICD-10-CM | POA: Insufficient documentation

## 2010-12-29 DIAGNOSIS — M51379 Other intervertebral disc degeneration, lumbosacral region without mention of lumbar back pain or lower extremity pain: Secondary | ICD-10-CM | POA: Insufficient documentation

## 2010-12-29 DIAGNOSIS — M5137 Other intervertebral disc degeneration, lumbosacral region: Secondary | ICD-10-CM | POA: Insufficient documentation

## 2010-12-29 DIAGNOSIS — G9589 Other specified diseases of spinal cord: Secondary | ICD-10-CM | POA: Insufficient documentation

## 2011-02-17 ENCOUNTER — Encounter (INDEPENDENT_AMBULATORY_CARE_PROVIDER_SITE_OTHER): Payer: Medicare HMO | Admitting: Ophthalmology

## 2011-02-17 DIAGNOSIS — E1139 Type 2 diabetes mellitus with other diabetic ophthalmic complication: Secondary | ICD-10-CM

## 2011-02-17 DIAGNOSIS — E11359 Type 2 diabetes mellitus with proliferative diabetic retinopathy without macular edema: Secondary | ICD-10-CM

## 2011-02-17 DIAGNOSIS — H43819 Vitreous degeneration, unspecified eye: Secondary | ICD-10-CM

## 2011-04-28 ENCOUNTER — Other Ambulatory Visit: Payer: Self-pay

## 2011-04-28 MED ORDER — PRAVASTATIN SODIUM 80 MG PO TABS: 80.0000 mg | ORAL_TABLET | Freq: Every day | ORAL | Status: DC

## 2011-08-23 ENCOUNTER — Ambulatory Visit (INDEPENDENT_AMBULATORY_CARE_PROVIDER_SITE_OTHER): Payer: Medicare HMO | Admitting: Ophthalmology

## 2011-08-27 ENCOUNTER — Ambulatory Visit (INDEPENDENT_AMBULATORY_CARE_PROVIDER_SITE_OTHER): Payer: Medicare Other | Admitting: Ophthalmology

## 2011-08-27 DIAGNOSIS — E11359 Type 2 diabetes mellitus with proliferative diabetic retinopathy without macular edema: Secondary | ICD-10-CM

## 2011-08-27 DIAGNOSIS — E1139 Type 2 diabetes mellitus with other diabetic ophthalmic complication: Secondary | ICD-10-CM

## 2011-08-27 DIAGNOSIS — H35039 Hypertensive retinopathy, unspecified eye: Secondary | ICD-10-CM

## 2011-08-27 DIAGNOSIS — I10 Essential (primary) hypertension: Secondary | ICD-10-CM

## 2011-08-27 DIAGNOSIS — H43819 Vitreous degeneration, unspecified eye: Secondary | ICD-10-CM

## 2011-09-02 ENCOUNTER — Ambulatory Visit (INDEPENDENT_AMBULATORY_CARE_PROVIDER_SITE_OTHER): Payer: Medicare Other | Admitting: Ophthalmology

## 2011-09-03 ENCOUNTER — Ambulatory Visit (INDEPENDENT_AMBULATORY_CARE_PROVIDER_SITE_OTHER): Payer: Medicare Other | Admitting: Ophthalmology

## 2011-09-08 ENCOUNTER — Ambulatory Visit (INDEPENDENT_AMBULATORY_CARE_PROVIDER_SITE_OTHER): Payer: Medicare Other | Admitting: Ophthalmology

## 2011-09-08 DIAGNOSIS — E11359 Type 2 diabetes mellitus with proliferative diabetic retinopathy without macular edema: Secondary | ICD-10-CM

## 2011-09-08 DIAGNOSIS — E1165 Type 2 diabetes mellitus with hyperglycemia: Secondary | ICD-10-CM

## 2011-09-16 ENCOUNTER — Other Ambulatory Visit: Payer: Self-pay | Admitting: Internal Medicine

## 2011-09-16 DIAGNOSIS — R42 Dizziness and giddiness: Secondary | ICD-10-CM

## 2011-09-21 ENCOUNTER — Ambulatory Visit (HOSPITAL_COMMUNITY)
Admission: RE | Admit: 2011-09-21 | Discharge: 2011-09-21 | Disposition: A | Discharge status: Home / Self Care | Payer: Medicare Other | Source: Ambulatory Visit | Attending: Internal Medicine | Admitting: Internal Medicine

## 2011-09-21 DIAGNOSIS — R55 Syncope and collapse: Secondary | ICD-10-CM | POA: Insufficient documentation

## 2011-09-21 DIAGNOSIS — I6789 Other cerebrovascular disease: Secondary | ICD-10-CM | POA: Insufficient documentation

## 2011-09-21 DIAGNOSIS — R42 Dizziness and giddiness: Secondary | ICD-10-CM | POA: Insufficient documentation

## 2011-12-20 ENCOUNTER — Ambulatory Visit (INDEPENDENT_AMBULATORY_CARE_PROVIDER_SITE_OTHER): Payer: Medicare Other | Admitting: Cardiology

## 2011-12-20 ENCOUNTER — Encounter: Payer: Self-pay | Admitting: Cardiology

## 2011-12-20 VITALS — BP 132/78 | HR 71 | Ht 66.0 in | Wt 175.0 lb

## 2011-12-20 DIAGNOSIS — I10 Essential (primary) hypertension: Secondary | ICD-10-CM

## 2011-12-20 DIAGNOSIS — I2510 Atherosclerotic heart disease of native coronary artery without angina pectoris: Secondary | ICD-10-CM

## 2011-12-20 DIAGNOSIS — I340 Nonrheumatic mitral (valve) insufficiency: Secondary | ICD-10-CM | POA: Insufficient documentation

## 2011-12-20 DIAGNOSIS — I059 Rheumatic mitral valve disease, unspecified: Secondary | ICD-10-CM

## 2011-12-20 DIAGNOSIS — E785 Hyperlipidemia, unspecified: Secondary | ICD-10-CM

## 2011-12-20 DIAGNOSIS — I5022 Chronic systolic (congestive) heart failure: Secondary | ICD-10-CM

## 2011-12-20 DIAGNOSIS — E669 Obesity, unspecified: Secondary | ICD-10-CM

## 2011-12-20 NOTE — Assessment & Plan Note (Signed)
Good control. Continue to follow Dr. Carlis Abbott.

## 2011-12-20 NOTE — Patient Instructions (Addendum)
Your physician recommends that you continue on your current medications as directed. Please refer to the Current Medication list given to you today.  Your physician wants you to follow-up in: 1 year. You will receive a reminder letter in the mail two months in advance. If you don't receive a letter, please call our office to schedule the follow-up appointment.  

## 2011-12-20 NOTE — Assessment & Plan Note (Signed)
No sign of decompensation by history or exam. Continue secondary preventative treatment to control blood pressure and blood sugar. Emphasized with patient.

## 2011-12-20 NOTE — Progress Notes (Signed)
HPI Glenda [name] returns today for evaluation and management of her coronary artery disease. Shows a history of diastolic dysfunction and mild mitral regurgitation.  She's very compliant with her medications. She denies any chest pain or angina. She does k short of breath that she thinks this is because of chronic back pain.  Denies orthopnea, PND or edema.  Dr. Carlis Abbott follows her blood sugars. They have been much better with a hemoglobin A1c she says of less than 7%. He also follows her lipids.  I look back through her chart vigorously and cannot see where she was diagnosed with coronary disease. She is seen by our group since 1999 as best I can tell.  She denies any retinopathy, symptoms of TIAs or mini strokes, or renal disease.  Past Medical History  Diagnosis Date  . GERD (gastroesophageal reflux disease)   . Obesity   . DM (diabetes mellitus)   . HTN (hypertension)   . HLD (hyperlipidemia)   . CAD (coronary artery disease)   . UTI (urinary tract infection)   . Chest pain     Current Outpatient Prescriptions  Medication Sig Dispense Refill  . amLODipine (NORVASC) 10 MG tablet Take 10 mg by mouth daily.        Marland Kitchen aspirin 81 MG EC tablet Take 81 mg by mouth daily.        . furosemide (LASIX) 80 MG tablet Take 80 mg by mouth daily.      . insulin glargine (LANTUS) 100 UNIT/ML injection Inject 50 Units into the skin at bedtime.        . insulin NPH (NOVOLIN N) 100 UNIT/ML injection Inject into the skin. Sliding scale       . labetalol (NORMODYNE) 300 MG tablet Take 300 mg by mouth 2 (two) times daily.        Marland Kitchen losartan-hydrochlorothiazide (HYZAAR) 100-25 MG per tablet Take 1 tablet by mouth daily.        Marland Kitchen omeprazole (PRILOSEC) 20 MG capsule Take 20 mg by mouth daily.        . potassium chloride (KLOR-CON) 10 MEQ CR tablet Take 10 mEq by mouth daily.        . pravastatin (PRAVACHOL) 80 MG tablet Take 1 tablet (80 mg total) by mouth daily.  30 tablet  5  . traMADol (ULTRAM) 50 MG  tablet Take 50 mg by mouth every 6 (six) hours as needed.          No Known Allergies  No family history on file.  History   Social History  . Marital Status: Widowed    Spouse Name: N/A    Number of Children: N/A  . Years of Education: N/A   Occupational History  . Not on file.   Social History Main Topics  . Smoking status: Never Smoker   . Smokeless tobacco: Not on file  . Alcohol Use: Not on file  . Drug Use: Not on file  . Sexually Active: Not on file   Other Topics Concern  . Not on file   Social History Narrative   Retired.     ROS ALL NEGATIVE EXCEPT THOSE NOTED IN HPI  PE  General Appearance: well developed, well nourished in no acute distress, very pleasant and upbeat HEENT: symmetrical face, PERRLA, good dentition  Neck: no JVD, thyromegaly, or adenopathy, trachea midline Chest: symmetric without deformity Cardiac: PMI non-displaced, RRR, normal S1, S2, no gallop or murmur Lung: clear to ausculation and percussion Vascular: all pulses full  without bruits  Abdominal: nondistended, nontender, good bowel sounds, no HSM, no bruits Extremities: no cyanosis, clubbing or edema, no sign of DVT, no varicosities  Skin: normal color, no rashes Neuro: alert and oriented x 3, non-focal Pysch: normal affect  EKG Normal sinus rhythm, nonspecific T wave changes, no acute changes. BMET    Component Value Date/Time   NA 144 04/26/2009 1701   K 4.1 04/26/2009 1701   CL 111 04/26/2009 1701   CO2 25 03/09/2009 0520   GLUCOSE 208* 04/26/2009 1701   BUN 14 04/26/2009 1701   CREATININE 0.9 04/26/2009 1701   CALCIUM 8.8 03/09/2009 0520   GFRNONAA 50* 03/09/2009 0520   GFRAA  Value: >60        The eGFR has been calculated using the MDRD equation. This calculation has not been validated in all clinical situations. eGFR's persistently <60 mL/min signify possible Chronic Kidney Disease. 03/09/2009 0520    Lipid Panel     Component Value Date/Time   CHOL  Value: 88        ATP  III CLASSIFICATION:  <200     mg/dL   Desirable  200-239  mg/dL   Borderline High  >=240    mg/dL   High        02/15/2009 0350   TRIG 95 02/15/2009 0350   HDL 43 02/15/2009 0350   CHOLHDL 2.0 02/15/2009 0350   VLDL 19 02/15/2009 0350   LDLCALC  Value: 26        Total Cholesterol/HDL:CHD Risk Coronary Heart Disease Risk Table                     Men   Women  1/2 Average Risk   3.4   3.3  Average Risk       5.0   4.4  2 X Average Risk   9.6   7.1  3 X Average Risk  23.4   11.0        Use the calculated Patient Ratio above and the CHD Risk Table to determine the patient's CHD Risk.        ATP III CLASSIFICATION (LDL):  <100     mg/dL   Optimal  100-129  mg/dL   Near or Above                    Optimal  130-159  mg/dL   Borderline  160-189  mg/dL   High  >190     mg/dL   Very High 02/15/2009 0350    CBC    Component Value Date/Time   WBC 5.4 03/16/2009   RBC 3.87 03/07/2009 0450   HGB 12.6 04/26/2009 1701   HCT 37.0 04/26/2009 1701   PLT 200 03/16/2009   MCV 82.7 03/16/2009   MCHC 33.3 03/07/2009 0450   RDW 14.3 03/07/2009 0450   LYMPHSABS 2.2 03/07/2009 0450   MONOABS 0.6 03/07/2009 0450   EOSABS 0.1 03/07/2009 0450   BASOSABS 0.0 03/07/2009 0450

## 2011-12-20 NOTE — Assessment & Plan Note (Signed)
Stable. No change in secondary preventative therapy.

## 2011-12-20 NOTE — Assessment & Plan Note (Signed)
No change by exam. Stable. Would not repeat echocardiogram.

## 2012-01-13 ENCOUNTER — Ambulatory Visit (INDEPENDENT_AMBULATORY_CARE_PROVIDER_SITE_OTHER): Payer: Medicare Other | Admitting: Ophthalmology

## 2012-02-17 ENCOUNTER — Ambulatory Visit (INDEPENDENT_AMBULATORY_CARE_PROVIDER_SITE_OTHER): Payer: Medicare Other | Admitting: Ophthalmology

## 2012-02-17 DIAGNOSIS — H26499 Other secondary cataract, unspecified eye: Secondary | ICD-10-CM

## 2012-02-17 DIAGNOSIS — E1165 Type 2 diabetes mellitus with hyperglycemia: Secondary | ICD-10-CM

## 2012-02-17 DIAGNOSIS — E11359 Type 2 diabetes mellitus with proliferative diabetic retinopathy without macular edema: Secondary | ICD-10-CM

## 2012-02-17 DIAGNOSIS — H43819 Vitreous degeneration, unspecified eye: Secondary | ICD-10-CM

## 2012-02-17 DIAGNOSIS — H35039 Hypertensive retinopathy, unspecified eye: Secondary | ICD-10-CM

## 2012-02-17 DIAGNOSIS — I10 Essential (primary) hypertension: Secondary | ICD-10-CM

## 2012-03-16 ENCOUNTER — Encounter (INDEPENDENT_AMBULATORY_CARE_PROVIDER_SITE_OTHER): Payer: Medicare Other | Admitting: Ophthalmology

## 2012-10-11 ENCOUNTER — Other Ambulatory Visit: Payer: Self-pay | Admitting: Internal Medicine

## 2012-10-11 DIAGNOSIS — M5136 Other intervertebral disc degeneration, lumbar region: Secondary | ICD-10-CM

## 2012-10-11 DIAGNOSIS — M545 Low back pain: Secondary | ICD-10-CM

## 2012-10-16 ENCOUNTER — Ambulatory Visit (HOSPITAL_COMMUNITY)
Admission: RE | Admit: 2012-10-16 | Discharge: 2012-10-16 | Disposition: A | Discharge status: Home / Self Care | Payer: Medicare Other | Source: Ambulatory Visit | Attending: Internal Medicine | Admitting: Internal Medicine

## 2012-10-16 DIAGNOSIS — S32009A Unspecified fracture of unspecified lumbar vertebra, initial encounter for closed fracture: Secondary | ICD-10-CM | POA: Insufficient documentation

## 2012-10-16 DIAGNOSIS — M47814 Spondylosis without myelopathy or radiculopathy, thoracic region: Secondary | ICD-10-CM | POA: Insufficient documentation

## 2012-10-16 DIAGNOSIS — X58XXXA Exposure to other specified factors, initial encounter: Secondary | ICD-10-CM | POA: Insufficient documentation

## 2012-10-16 DIAGNOSIS — M5146 Schmorl's nodes, lumbar region: Secondary | ICD-10-CM | POA: Insufficient documentation

## 2012-10-16 DIAGNOSIS — M47817 Spondylosis without myelopathy or radiculopathy, lumbosacral region: Secondary | ICD-10-CM | POA: Insufficient documentation

## 2012-10-16 DIAGNOSIS — M5137 Other intervertebral disc degeneration, lumbosacral region: Secondary | ICD-10-CM | POA: Insufficient documentation

## 2012-10-16 DIAGNOSIS — M545 Low back pain: Secondary | ICD-10-CM

## 2012-10-16 DIAGNOSIS — M5136 Other intervertebral disc degeneration, lumbar region: Secondary | ICD-10-CM

## 2012-10-16 DIAGNOSIS — M51379 Other intervertebral disc degeneration, lumbosacral region without mention of lumbar back pain or lower extremity pain: Secondary | ICD-10-CM | POA: Insufficient documentation

## 2012-12-21 ENCOUNTER — Encounter: Payer: Self-pay | Admitting: Cardiology

## 2012-12-21 ENCOUNTER — Ambulatory Visit (INDEPENDENT_AMBULATORY_CARE_PROVIDER_SITE_OTHER): Payer: Medicare Other | Admitting: Cardiology

## 2012-12-21 VITALS — BP 126/66 | HR 75 | Ht 66.0 in | Wt 175.0 lb

## 2012-12-21 DIAGNOSIS — E669 Obesity, unspecified: Secondary | ICD-10-CM

## 2012-12-21 DIAGNOSIS — I059 Rheumatic mitral valve disease, unspecified: Secondary | ICD-10-CM

## 2012-12-21 DIAGNOSIS — I340 Nonrheumatic mitral (valve) insufficiency: Secondary | ICD-10-CM

## 2012-12-21 DIAGNOSIS — I2510 Atherosclerotic heart disease of native coronary artery without angina pectoris: Secondary | ICD-10-CM

## 2012-12-21 DIAGNOSIS — E785 Hyperlipidemia, unspecified: Secondary | ICD-10-CM

## 2012-12-21 NOTE — Assessment & Plan Note (Signed)
Stable. Continue secondary preventative therapy. She will followup with Dr Meda Coffee.

## 2012-12-21 NOTE — Progress Notes (Signed)
HPI [name] returns today for evaluation and management of her coronary artery disease. She is having no angina or ischemic symptoms. She also a history of mild mitral regurgitation by echocardiography. She is asymptomatic. Her biggest problem is low back pain which restricts her walking. Blood work is followed by primary care.  Past Medical History  Diagnosis Date  . GERD (gastroesophageal reflux disease)   . Obesity   . DM (diabetes mellitus)   . HTN (hypertension)   . HLD (hyperlipidemia)   . CAD (coronary artery disease)   . UTI (urinary tract infection)   . Chest pain     Current Outpatient Prescriptions  Medication Sig Dispense Refill  . amLODipine (NORVASC) 10 MG tablet Take 10 mg by mouth daily.        Marland Kitchen aspirin 81 MG EC tablet Take 81 mg by mouth daily.        . furosemide (LASIX) 80 MG tablet Take 80 mg by mouth daily.      Marland Kitchen HUMALOG KWIKPEN 100 UNIT/ML SOPN 12 Units 3 (three) times daily.      . insulin glargine (LANTUS) 100 UNIT/ML injection Inject 50 Units into the skin at bedtime.        Marland Kitchen labetalol (NORMODYNE) 300 MG tablet Take 300 mg by mouth 2 (two) times daily.        Marland Kitchen losartan-hydrochlorothiazide (HYZAAR) 100-25 MG per tablet Take 1 tablet by mouth daily.        Marland Kitchen omeprazole (PRILOSEC) 20 MG capsule Take 20 mg by mouth daily.        . potassium chloride (KLOR-CON) 10 MEQ CR tablet Take 10 mEq by mouth daily.        . pravastatin (PRAVACHOL) 80 MG tablet Take 1 tablet (80 mg total) by mouth daily.  30 tablet  5   No current facility-administered medications for this visit.    No Known Allergies  No family history on file.  History   Social History  . Marital Status: Widowed    Spouse Name: N/A    Number of Children: N/A  . Years of Education: N/A   Occupational History  . Not on file.   Social History Main Topics  . Smoking status: Never Smoker   . Smokeless tobacco: Not on file  . Alcohol Use: Not on file  . Drug Use: Not on file  . Sexual  Activity: Not on file   Other Topics Concern  . Not on file   Social History Narrative   Retired.     ROS ALL NEGATIVE EXCEPT THOSE NOTED IN HPI  PE  General Appearance: well developed, well nourished in no acute distress, overweight HEENT: symmetrical face, PERRLA, good dentition  Neck: no JVD, thyromegaly, or adenopathy, trachea midline Chest: symmetric without deformity Cardiac: PMI non-displaced, RRR, normal S1, S2, no gallop or murmur Lung: clear to ausculation and percussion Vascular: all pulses full without bruits  Abdominal: nondistended, nontender, good bowel sounds, no HSM, no bruits Extremities: no cyanosis, clubbing or edema, no sign of DVT, no varicosities  Skin: normal color, no rashes Neuro: alert and oriented x 3, non-focal Pysch: normal affect  EKG Normal sinus rhythm, nonspecific T wave changes BMET    Component Value Date/Time   NA 144 04/26/2009 1701   K 4.1 04/26/2009 1701   CL 111 04/26/2009 1701   CO2 25 03/09/2009 0520   GLUCOSE 208* 04/26/2009 1701   BUN 14 04/26/2009 1701   CREATININE 0.9 04/26/2009 1701  CALCIUM 8.8 03/09/2009 0520   GFRNONAA 50* 03/09/2009 0520   GFRAA  Value: >60        The eGFR has been calculated using the MDRD equation. This calculation has not been validated in all clinical situations. eGFR's persistently <60 mL/min signify possible Chronic Kidney Disease. 03/09/2009 0520    Lipid Panel     Component Value Date/Time   CHOL  Value: 88        ATP III CLASSIFICATION:  <200     mg/dL   Desirable  200-239  mg/dL   Borderline High  >=240    mg/dL   High        02/15/2009 0350   TRIG 95 02/15/2009 0350   HDL 43 02/15/2009 0350   CHOLHDL 2.0 02/15/2009 0350   VLDL 19 02/15/2009 0350   LDLCALC  Value: 26        Total Cholesterol/HDL:CHD Risk Coronary Heart Disease Risk Table                     Men   Women  1/2 Average Risk   3.4   3.3  Average Risk       5.0   4.4  2 X Average Risk   9.6   7.1  3 X Average Risk  23.4   11.0        Use  the calculated Patient Ratio above and the CHD Risk Table to determine the patient's CHD Risk.        ATP III CLASSIFICATION (LDL):  <100     mg/dL   Optimal  100-129  mg/dL   Near or Above                    Optimal  130-159  mg/dL   Borderline  160-189  mg/dL   High  >190     mg/dL   Very High 02/15/2009 0350    CBC    Component Value Date/Time   WBC 5.4 03/16/2009   RBC 3.87 03/07/2009 0450   HGB 12.6 04/26/2009 1701   HCT 37.0 04/26/2009 1701   PLT 200 03/16/2009   MCV 82.7 03/16/2009   MCHC 33.3 03/07/2009 0450   RDW 14.3 03/07/2009 0450   LYMPHSABS 2.2 03/07/2009 0450   MONOABS 0.6 03/07/2009 0450   EOSABS 0.1 03/07/2009 0450   BASOSABS 0.0 03/07/2009 0450

## 2012-12-21 NOTE — Patient Instructions (Addendum)
Your physician recommends that you continue on your current medications as directed. Please refer to the Current Medication list given to you today.  Your physician wants you to follow-up in: 1 year with Dr. Ena Dawley.  You will receive a reminder letter in the mail two months in advance. If you don't receive a letter, please call our office to schedule the follow-up appointment.

## 2013-04-05 ENCOUNTER — Ambulatory Visit: Payer: Self-pay | Admitting: Podiatry

## 2013-07-17 ENCOUNTER — Encounter: Payer: Self-pay | Admitting: Podiatry

## 2013-07-17 ENCOUNTER — Ambulatory Visit (INDEPENDENT_AMBULATORY_CARE_PROVIDER_SITE_OTHER): Payer: Medicare HMO | Admitting: Podiatry

## 2013-07-17 VITALS — BP 139/76 | HR 71 | Resp 16 | Ht 66.0 in | Wt 168.0 lb

## 2013-07-17 DIAGNOSIS — M79609 Pain in unspecified limb: Secondary | ICD-10-CM

## 2013-07-17 DIAGNOSIS — B351 Tinea unguium: Secondary | ICD-10-CM

## 2013-07-17 NOTE — Progress Notes (Signed)
Painful thick toenails , need to have trimmed because they're painful.  Objective: Vital signs are stable she is alert and oriented x3. Pulses are palpable bilateral. Nails are thick yellow dystrophic with mycotic and painful palpation.  Assessment: Pain in limb secondary to onychomycosis 1 through 5 bilateral.  Plan: Debridement of nails 1 through 5 bilateral covered service secondary to pain.

## 2013-09-13 ENCOUNTER — Encounter (INDEPENDENT_AMBULATORY_CARE_PROVIDER_SITE_OTHER): Payer: Medicare HMO | Admitting: Ophthalmology

## 2013-09-13 DIAGNOSIS — E11359 Type 2 diabetes mellitus with proliferative diabetic retinopathy without macular edema: Secondary | ICD-10-CM

## 2013-09-13 DIAGNOSIS — H35039 Hypertensive retinopathy, unspecified eye: Secondary | ICD-10-CM

## 2013-09-13 DIAGNOSIS — E1139 Type 2 diabetes mellitus with other diabetic ophthalmic complication: Secondary | ICD-10-CM

## 2013-09-13 DIAGNOSIS — H43819 Vitreous degeneration, unspecified eye: Secondary | ICD-10-CM

## 2013-09-13 DIAGNOSIS — I10 Essential (primary) hypertension: Secondary | ICD-10-CM

## 2013-09-13 DIAGNOSIS — E1165 Type 2 diabetes mellitus with hyperglycemia: Secondary | ICD-10-CM

## 2013-10-30 ENCOUNTER — Encounter: Payer: Self-pay | Admitting: Podiatry

## 2013-10-30 ENCOUNTER — Ambulatory Visit (INDEPENDENT_AMBULATORY_CARE_PROVIDER_SITE_OTHER): Payer: Medicare HMO | Admitting: Podiatry

## 2013-10-30 VITALS — BP 140/64 | HR 73 | Resp 16

## 2013-10-30 DIAGNOSIS — B351 Tinea unguium: Secondary | ICD-10-CM

## 2013-10-30 DIAGNOSIS — M79605 Pain in left leg: Secondary | ICD-10-CM

## 2013-10-30 DIAGNOSIS — M79609 Pain in unspecified limb: Secondary | ICD-10-CM

## 2013-10-30 DIAGNOSIS — M79604 Pain in right leg: Secondary | ICD-10-CM

## 2013-10-30 NOTE — Patient Instructions (Signed)
Diabetes and Foot Care Diabetes may cause you to have problems because of poor blood supply (circulation) to your feet and legs. This may cause the skin on your feet to become thinner, break easier, and heal more slowly. Your skin may become dry, and the skin may peel and crack. You may also have nerve damage in your legs and feet causing decreased feeling in them. You may not notice minor injuries to your feet that could lead to infections or more serious problems. Taking care of your feet is one of the most important things you can do for yourself.  HOME CARE INSTRUCTIONS  Wear shoes at all times, even in the house. Do not go barefoot. Bare feet are easily injured.  Check your feet daily for blisters, cuts, and redness. If you cannot see the bottom of your feet, use a mirror or ask someone for help.  Wash your feet with warm water (do not use hot water) and mild soap. Then pat your feet and the areas between your toes until they are completely dry. Do not soak your feet as this can dry your skin.  Apply a moisturizing lotion or petroleum jelly (that does not contain alcohol and is unscented) to the skin on your feet and to dry, brittle toenails. Do not apply lotion between your toes.  Trim your toenails straight across. Do not dig under them or around the cuticle. File the edges of your nails with an emery board or nail file.  Do not cut corns or calluses or try to remove them with medicine.  Wear clean socks or stockings every day. Make sure they are not too tight. Do not wear knee-high stockings since they may decrease blood flow to your legs.  Wear shoes that fit properly and have enough cushioning. To break in new shoes, wear them for just a few hours a day. This prevents you from injuring your feet. Always look in your shoes before you put them on to be sure there are no objects inside.  Do not cross your legs. This may decrease the blood flow to your feet.  If you find a minor scrape,  cut, or break in the skin on your feet, keep it and the skin around it clean and dry. These areas may be cleansed with mild soap and water. Do not cleanse the area with peroxide, alcohol, or iodine.  When you remove an adhesive bandage, be sure not to damage the skin around it.  If you have a wound, look at it several times a day to make sure it is healing.  Do not use heating pads or hot water bottles. They may burn your skin. If you have lost feeling in your feet or legs, you may not know it is happening until it is too late.  Make sure your health care provider performs a complete foot exam at least annually or more often if you have foot problems. Report any cuts, sores, or bruises to your health care provider immediately. SEEK MEDICAL CARE IF:   You have an injury that is not healing.  You have cuts or breaks in the skin.  You have an ingrown nail.  You notice redness on your legs or feet.  You feel burning or tingling in your legs or feet.  You have pain or cramps in your legs and feet.  Your legs or feet are numb.  Your feet always feel cold. SEEK IMMEDIATE MEDICAL CARE IF:   There is increasing redness,   swelling, or pain in or around a wound.  There is a red line that goes up your leg.  Pus is coming from a wound.  You develop a fever or as directed by your health care provider.  You notice a bad smell coming from an ulcer or wound. Document Released: 04/09/2000 Document Revised: 12/13/2012 Document Reviewed: 09/19/2012 Exit[employer] Patient Information 2015 [employer]. This information is not intended to replace advice given to you by your health care provider. Make sure you discuss any questions you have with your health care provider.  

## 2013-10-30 NOTE — Progress Notes (Signed)
She presents today with a chief complaint of painful elongated toenails.  Objective: Pulses are palpable bilateral. Her nails are thick yellow dystrophic onychomycotic and painful palpation.  Assessment: Pain in limb secondary to onychomycosis 1 through 5 bilateral.  Plan: Debridement of nails 1 through 5 bilateral.

## 2013-12-24 ENCOUNTER — Encounter: Payer: Self-pay | Admitting: *Deleted

## 2013-12-25 ENCOUNTER — Encounter: Payer: Self-pay | Admitting: Cardiology

## 2013-12-25 ENCOUNTER — Ambulatory Visit (INDEPENDENT_AMBULATORY_CARE_PROVIDER_SITE_OTHER): Payer: Medicare HMO | Admitting: Cardiology

## 2013-12-25 VITALS — BP 124/78 | HR 72 | Ht 66.0 in | Wt 161.0 lb

## 2013-12-25 DIAGNOSIS — R079 Chest pain, unspecified: Secondary | ICD-10-CM

## 2013-12-25 NOTE — Progress Notes (Signed)
Patient ID: Glenda [name], female   DOB: 1933/08/04, 81 y.o.   MRN: [id number]      Patient Name: Glenda [name] Date of Encounter: 12/25/2013  Primary Care Provider:  Foye Spurling, MD Primary Cardiologist: Dorothy Spark (Dr. Verl Blalock)  Problem List   Past Medical History  Diagnosis Date  . GERD (gastroesophageal reflux disease)   . Obesity   . DM (diabetes mellitus)   . HTN (hypertension)   . HLD (hyperlipidemia)   . CAD (coronary artery disease)   . UTI (urinary tract infection)   . Chest pain    Past Surgical History  Procedure Laterality Date  . Cholecystectomy    . Colon polyps; hx    . Coronary angioplasty  02/14/09    Allergies  No Known Allergies  HPI  Glenda [name] returns today for evaluation and management of her coronary artery disease. She is having no typical angina, however she has been experiencing a lot of gas built up and belching, predominantly at night. She denies DOE, but experiences back pain while walking. On and off LE edema, currently normal, no orthopnea or PND, no syncope.  She also a history of mild mitral regurgitation by echocardiography. She is asymptomatic.  Lipids are labs are followed by her PCP.   Home Medications  Prior to Admission medications   Medication Sig Start Date End Date Taking? Authorizing Provider  aspirin 81 MG EC tablet Take 81 mg by mouth daily.     Yes Historical Provider, MD  FREESTYLE LITE test strip  05/22/13  Yes Historical Provider, MD  furosemide (LASIX) 80 MG tablet Take 80 mg by mouth daily.   Yes Historical Provider, MD  Insulin Aspart (NOVOLOG FLEXPEN [place]) Inject 40 Units into the skin as directed.   Yes Historical Provider, MD  labetalol (NORMODYNE) 300 MG tablet Take 300 mg by mouth 2 (two) times daily.     Yes Historical Provider, MD  LEVEMIR FLEXTOUCH 100 UNIT/ML Pen 25 Units 2 (two) times daily. 11/21/13  Yes Historical Provider, MD  olmesartan-hydrochlorothiazide (BENICAR HCT) 40-25 MG per tablet Take  1 tablet by mouth daily.   Yes Historical Provider, MD  omeprazole (PRILOSEC) 20 MG capsule Take 20 mg by mouth daily.     Yes Historical Provider, MD  potassium chloride (KLOR-CON) 10 MEQ CR tablet Take 10 mEq by mouth daily.     Yes Historical Provider, MD  pravastatin (PRAVACHOL) 80 MG tablet Take 1 tablet (80 mg total) by mouth daily. 04/28/11  Yes Renella Cunas, MD  amLODipine (NORVASC) 10 MG tablet Take 10 mg by mouth daily.      Historical Provider, MD  losartan-hydrochlorothiazide (HYZAAR) 100-25 MG per tablet Take 1 tablet by mouth daily.      Historical Provider, MD  meclizine (ANTIVERT) 25 MG tablet  05/23/13   Historical Provider, MD  traMADol Veatrice Bourbon) 50 MG tablet  05/07/13   Historical Provider, MD    Family History  Family History  Problem Relation Age of Onset  . Diabetes Mother   . Cancer Father   . Diabetes    . Heart attack    . Diabetes Sister     TWIN    Social History  History   Social History  . Marital Status: Widowed    Spouse Name: N/A    Number of Children: N/A  . Years of Education: N/A   Occupational History  . Not on file.   Social History Main Topics  . Smoking status: Never  Smoker   . Smokeless tobacco: Not on file  . Alcohol Use: Not on file  . Drug Use: Not on file  . Sexual Activity: Not on file   Other Topics Concern  . Not on file   Social History Narrative   Retired.      Review of Systems, as per HPI, otherwise negative General:  No chills, fever, night sweats or weight changes.  Cardiovascular:  No chest pain, dyspnea on exertion, edema, orthopnea, palpitations, paroxysmal nocturnal dyspnea. Dermatological: No rash, lesions/masses Respiratory: No cough, dyspnea Urologic: No hematuria, dysuria Abdominal:   No nausea, vomiting, diarrhea, bright red blood per rectum, melena, or hematemesis Neurologic:  No visual changes, wkns, changes in mental status. All other systems reviewed and are otherwise negative except as noted  above.  Physical Exam  There were no vitals taken for this visit.  General: Pleasant, NAD Psych: Normal affect. Neuro: Alert and oriented X 3. Moves all extremities spontaneously. HEENT: Normal  Neck: Supple without bruits or JVD. Lungs:  Resp regular and unlabored, CTA. Heart: RRR no s3, s4, or murmurs. Abdomen: Soft, non-tender, non-distended, BS + x 4.  Extremities: No clubbing, cyanosis or edema. DP/PT/Radials 2+ and equal bilaterally.  Labs:  No results found for this basename: CKTOTAL, CKMB, TROPONINI,  in the last 72 hours Lab Results  Component Value Date   WBC 5.4 03/16/2009   HGB 12.6 04/26/2009   HCT 37.0 04/26/2009   MCV 82.7 03/16/2009   PLT 200 03/16/2009    No results found for this basename: DDIMER   No components found with this basename: POCBNP,     Component Value Date/Time   NA 144 04/26/2009 1701   K 4.1 04/26/2009 1701   CL 111 04/26/2009 1701   CO2 25 03/09/2009 0520   GLUCOSE 208* 04/26/2009 1701   BUN 14 04/26/2009 1701   CREATININE 0.9 04/26/2009 1701   CALCIUM 8.8 03/09/2009 0520   PROT 6.7 03/07/2009 0450   ALBUMIN 3.1* 03/07/2009 0450   AST 31 03/07/2009 0450   ALT 20 03/07/2009 0450   ALKPHOS 115 03/07/2009 0450   BILITOT 0.6 03/07/2009 0450   GFRNONAA 50* 03/09/2009 0520   GFRAA  Value: >60        The eGFR has been calculated using the MDRD equation. This calculation has not been validated in all clinical situations. eGFR's persistently <60 mL/min signify possible Chronic Kidney Disease. 03/09/2009 0520   Accessory Clinical Findings  Echocardiogram - 03/07/2009   1. Left ventricle: The cavity size was normal. Wall thickness was normal. Systolic function was vigorous. The estimated ejection fraction was in the range of 65% to 70%. Doppler parameters are consistent with abnormal left ventricular relaxation (grade 1 diastolic dysfunction). 2. Mitral valve: Mild regurgitation. 3. Pulmonary arteries: PA peak pressure: 36m Hg (S).  ECG - SR,  non-specifi T wave abnormalities, more pronounced inferior negative T waves when compared to 12/21/2012  Left cardiac cath 2012 ANGIOGRAPHIC DATA:  1. Ventriculography was performed in the RAO projection. Overall systolic  function was well preserved and no segmental abnormalities or contraction  were identified. There was near cavity obliteration. No significant  mitral regurgitation was noted.  2. There was no obvious calcification of the coronary vessels.  3. The left main coronary artery was fairly large and free of critical  disease.  4. The LAD coursed to the apex, wrapped the apex, and supplied a significant  portion of the distal inferior wall. There was a tiny first diagonal  that was free of critical disease. The proximal and mid LAD appeared to  be without significant focal narrowing. The LAD after the major diagonal  which was the second diagonal demonstrates a segmental plaque of about 50-  60%. This was previously graded at 30, but appeared to be only mildly  progressed from the previous study. There is also a diagonal stenosis  that involves likely the bifurcation and is about 50-60% as well. The  diagonal is moderate in size.  5. The circumflex provides a tiny first marginal branch that has about a 50%  area of narrowing but it is very small. There is a second large marginal  branch which bifurcates twice in its distal course. It is a large  caliber vessel and without critical disease. The AV circumflex has a  segmental area of about 60-70% right after the takeoff of a large major  diagonal branch and that is graded a 60-70%. The distal termination of  this is a smaller posterolateral branch which thus supplies a fairly  small area of myocardium.  6. The right coronary artery is a small caliber vessel being 2 mm or less in  size. There is about a 30-40% area of plaquing in the first bend between  the proximal and mid vessel. There is probably 40% mid narrowing and in   the distal vessel some mild luminal irregularity. The terminal branches  which consist of a posterior descending and several posterolateral  branches are relatively small in caliber.  CONCLUSIONS:  1. Hypodynamic left ventricular function.  2. Scattered disease of the left anterior descending and diagonal as noted  on previous study.  3. Moderate obstruction of the AV circumflex after the large marginal  takeoff.  4. Scattered irregularities of the right coronary artery as described above.  DISPOSITION: The initial plan would be to treat the patient medically. It  is unclear which, if any, of these lesions might be causing significant  angina. We plan to check a D-dimer. We also plan to give the patient  medical therapy. She will be followed by Jeanann Lewandowsky, M.D. and either  Junious Silk, M.D. [facility] or Loretha Brasil. [name], M.D.     Assessment & Plan  1. CAD -  Atypical - back pain and "gas built up" , known moderate CAD on cath in 2012, ECG with possible more pronounced inferior negative T waves.  We will schedule a Lexiscan nuclear stress test.  2. HTN - controlled  3. HLP - followed by PCP  4. Mild pulmonary HTN - no significant symptoms  If normal stress test, follow up in 1 year    Dorothy Spark, MD, [facility] 12/25/2013, 11:15 AM

## 2013-12-25 NOTE — Patient Instructions (Signed)
Your physician recommends that you continue on your current medications as directed. Please refer to the Current Medication list given to you today.  Your physician has requested that you have a lexiscan myoview. For further information please visit [email or web address]. Please follow instruction sheet, as given.  Your physician wants you to follow-up in: [name] will receive a reminder letter in the mail two months in advance. If you don't receive a letter, please call our office to schedule the follow-up appointment.

## 2014-01-01 ENCOUNTER — Encounter: Payer: Self-pay | Admitting: Cardiology

## 2014-01-01 ENCOUNTER — Other Ambulatory Visit: Payer: Self-pay | Admitting: Cardiology

## 2014-01-02 ENCOUNTER — Encounter (HOSPITAL_COMMUNITY): Payer: Medicare HMO

## 2014-01-14 ENCOUNTER — Ambulatory Visit (INDEPENDENT_AMBULATORY_CARE_PROVIDER_SITE_OTHER): Payer: Medicare HMO | Admitting: Ophthalmology

## 2014-01-16 ENCOUNTER — Ambulatory Visit (INDEPENDENT_AMBULATORY_CARE_PROVIDER_SITE_OTHER): Payer: Medicare HMO | Admitting: Ophthalmology

## 2014-01-16 DIAGNOSIS — I10 Essential (primary) hypertension: Secondary | ICD-10-CM

## 2014-01-16 DIAGNOSIS — E11359 Type 2 diabetes mellitus with proliferative diabetic retinopathy without macular edema: Secondary | ICD-10-CM

## 2014-01-16 DIAGNOSIS — E1139 Type 2 diabetes mellitus with other diabetic ophthalmic complication: Secondary | ICD-10-CM

## 2014-01-16 DIAGNOSIS — H43819 Vitreous degeneration, unspecified eye: Secondary | ICD-10-CM

## 2014-01-16 DIAGNOSIS — E1165 Type 2 diabetes mellitus with hyperglycemia: Secondary | ICD-10-CM

## 2014-01-16 DIAGNOSIS — H35039 Hypertensive retinopathy, unspecified eye: Secondary | ICD-10-CM

## 2014-01-17 ENCOUNTER — Ambulatory Visit (HOSPITAL_COMMUNITY): Payer: Medicare HMO | Attending: Internal Medicine | Admitting: Radiology

## 2014-01-17 VITALS — BP 177/64 | Ht 66.0 in | Wt 155.0 lb

## 2014-01-17 DIAGNOSIS — R9431 Abnormal electrocardiogram [ECG] [EKG]: Secondary | ICD-10-CM

## 2014-01-17 DIAGNOSIS — I2510 Atherosclerotic heart disease of native coronary artery without angina pectoris: Secondary | ICD-10-CM

## 2014-01-17 DIAGNOSIS — R0789 Other chest pain: Secondary | ICD-10-CM | POA: Diagnosis not present

## 2014-01-17 DIAGNOSIS — R0989 Other specified symptoms and signs involving the circulatory and respiratory systems: Secondary | ICD-10-CM | POA: Insufficient documentation

## 2014-01-17 DIAGNOSIS — R079 Chest pain, unspecified: Secondary | ICD-10-CM

## 2014-01-17 DIAGNOSIS — I10 Essential (primary) hypertension: Secondary | ICD-10-CM | POA: Diagnosis not present

## 2014-01-17 DIAGNOSIS — R0609 Other forms of dyspnea: Secondary | ICD-10-CM | POA: Insufficient documentation

## 2014-01-17 DIAGNOSIS — E119 Type 2 diabetes mellitus without complications: Secondary | ICD-10-CM | POA: Diagnosis not present

## 2014-01-17 MED ORDER — TECHNETIUM TC 99M SESTAMIBI GENERIC - CARDIOLITE
11.0000 | Freq: Once | INTRAVENOUS | Status: AC | PRN
  Administered 2014-01-17: 11 via INTRAVENOUS

## 2014-01-17 MED ORDER — TECHNETIUM TC 99M SESTAMIBI GENERIC - CARDIOLITE
33.0000 | Freq: Once | INTRAVENOUS | Status: AC | PRN
  Administered 2014-01-17: 33 via INTRAVENOUS

## 2014-01-17 MED ORDER — REGADENOSON 0.4 MG/5ML IV SOLN
0.4000 mg | Freq: Once | INTRAVENOUS | Status: AC
  Administered 2014-01-17: 0.4 mg via INTRAVENOUS

## 2014-01-17 NOTE — Progress Notes (Signed)
[place] [address] [place], [place] [address] [phone]    Cardiology Nuclear Med Study  Glenda [name] is a 80 y.o. female     MRN : [id number]     DOB: 03-01-1934  Procedure Date: 01/17/2014  Nuclear Med Background Indication for Stress Test:  Evaluation for Ischemia, Abnormal EKG and Follow up CAD History:  CAD, 12/02/06 MPI: No Report in EPIC,  Cardiac Risk Factors: Hypertension and IDDM   Symptoms:  Chest Tightness and DOE   Nuclear Pre-Procedure Caffeine/Decaff Intake:  None> 12 hrs NPO After: 7:00pm   Lungs:  clear O2 Sat: 97% on room air. IV 0.9% NS with Angio Cath:  22g  IV Site: L Antecubital, tolerated well IV Started by:  Irven Baltimore, RN  Chest Size (in):  36 Cup Size: B  Height: 5\' 6"  (1.676 m)  Weight:  155 lb (70.308 kg)  BMI:  Body mass index is 25.03 kg/(m^2). Tech Comments:  Patient took Labetolol this am. Patient took full dose of Levemir Insulin at 7:00pm yesterday. No insulin today. Fasting CBG was 305 at 0845 on arrival. Irven Baltimore, Therapist, sports.    Nuclear Med Study 1 or 2 day study: 1 day  Stress Test Type:  Lexiscan  Reading MD: N/A  Order Authorizing Provider:  Ena Dawley, MD  Resting Radionuclide: Technetium 54m Sestamibi  Resting Radionuclide Dose: 11.0 mCi   Stress Radionuclide:  Technetium 14m Sestamibi  Stress Radionuclide Dose: 33.0 mCi           Stress Protocol Rest HR: 64 Stress HR: 73  Rest BP: 177/64 Stress BP: 191/65  Exercise Time (min): n/a METS: n/a   Predicted Max HR: 143 bpm % Max HR: 51.05 bpm Rate Pressure Product: 13943   Dose of Adenosine (mg):  n/a Dose of Lexiscan: 0.4 mg  Dose of Atropine (mg): n/a Dose of Dobutamine: n/a mcg/kg/min (at max HR)  Stress Test Technologist: Perrin Maltese, EMT-P  Nuclear Technologist:  Annye Rusk, CNMT     Rest Procedure:  Myocardial perfusion imaging was performed at rest 45 minutes following the intravenous administration of Technetium 63m  Sestamibi. Rest ECG: NSR with septal infarct and nonspecific T wave abnormality  Stress Procedure:  The patient received IV Lexiscan 0.4 mg over 15-seconds.  Technetium 27m Sestamibi injected at 30-seconds. This patient had no symptoms with the Lexiscan injection. Quantitative spect images were obtained after a 45 minute delay. Stress ECG: There are scattered PVCs.  QPS Raw Data Images:  Increased gut uptake more prominent on rest imaging. Stress Images:  Decreased uptake in the basal and mid inferior wall Rest Images:  Decreased uptake in the mid and inferior wall Subtraction (SDS):  There is a fixed basal inferior defect that is most consistent with diaphragmatic attenuation and increased gut uptake. Transient Ischemic Dilatation (Normal <1.22):  1.05 Lung/Heart Ratio (Normal <0.45):  0.26  Quantitative Gated Spect Images QGS EDV:  61 ml QGS ESV:  29 ml  Impression Exercise Capacity:  Lexiscan with no exercise. BP Response:  Normal blood pressure response. Clinical Symptoms:  No significant symptoms noted. ECG Impression:  There are scattered PVCs. Comparison with Prior Nuclear Study: No images to compare  Overall Impression:  Low risk stress nuclear study with a fixed defect in the mid and basal inferior wall due to attenuation artifact and increased gut uptake.  No ischemia noted.  LV Ejection Fraction: 53%.  LV Wall Motion:  NL LV Function; NL Wall Motion  Signed: Fransico Him, MD [facility]

## 2014-02-05 ENCOUNTER — Ambulatory Visit: Payer: Medicare HMO | Admitting: Podiatry

## 2014-07-04 ENCOUNTER — Inpatient Hospital Stay (HOSPITAL_COMMUNITY): Payer: Medicare Other

## 2014-07-04 ENCOUNTER — Inpatient Hospital Stay (HOSPITAL_COMMUNITY)
Admission: EM | Admit: 2014-07-04 | Discharge: 2014-07-06 | DRG: 065 | Disposition: A | Discharge status: Home Health | Payer: Medicare Other | Attending: Internal Medicine | Admitting: Internal Medicine

## 2014-07-04 ENCOUNTER — Encounter (HOSPITAL_COMMUNITY): Payer: Self-pay | Admitting: Emergency Medicine

## 2014-07-04 ENCOUNTER — Emergency Department (HOSPITAL_COMMUNITY): Payer: Medicare Other

## 2014-07-04 DIAGNOSIS — Z833 Family history of diabetes mellitus: Secondary | ICD-10-CM | POA: Diagnosis not present

## 2014-07-04 DIAGNOSIS — Z9111 Patient's noncompliance with dietary regimen: Secondary | ICD-10-CM | POA: Diagnosis present

## 2014-07-04 DIAGNOSIS — R4781 Slurred speech: Secondary | ICD-10-CM | POA: Diagnosis present

## 2014-07-04 DIAGNOSIS — Z794 Long term (current) use of insulin: Secondary | ICD-10-CM

## 2014-07-04 DIAGNOSIS — I639 Cerebral infarction, unspecified: Principal | ICD-10-CM | POA: Diagnosis present

## 2014-07-04 DIAGNOSIS — IMO0002 Reserved for concepts with insufficient information to code with codable children: Secondary | ICD-10-CM

## 2014-07-04 DIAGNOSIS — N390 Urinary tract infection, site not specified: Secondary | ICD-10-CM | POA: Diagnosis present

## 2014-07-04 DIAGNOSIS — Z9119 Patient's noncompliance with other medical treatment and regimen: Secondary | ICD-10-CM | POA: Diagnosis present

## 2014-07-04 DIAGNOSIS — E119 Type 2 diabetes mellitus without complications: Secondary | ICD-10-CM | POA: Diagnosis present

## 2014-07-04 DIAGNOSIS — E785 Hyperlipidemia, unspecified: Secondary | ICD-10-CM | POA: Diagnosis present

## 2014-07-04 DIAGNOSIS — K219 Gastro-esophageal reflux disease without esophagitis: Secondary | ICD-10-CM | POA: Diagnosis present

## 2014-07-04 DIAGNOSIS — I2510 Atherosclerotic heart disease of native coronary artery without angina pectoris: Secondary | ICD-10-CM | POA: Diagnosis present

## 2014-07-04 DIAGNOSIS — I10 Essential (primary) hypertension: Secondary | ICD-10-CM | POA: Diagnosis present

## 2014-07-04 DIAGNOSIS — W19XXXA Unspecified fall, initial encounter: Secondary | ICD-10-CM

## 2014-07-04 DIAGNOSIS — R55 Syncope and collapse: Secondary | ICD-10-CM | POA: Diagnosis present

## 2014-07-04 DIAGNOSIS — R112 Nausea with vomiting, unspecified: Secondary | ICD-10-CM

## 2014-07-04 DIAGNOSIS — Z8249 Family history of ischemic heart disease and other diseases of the circulatory system: Secondary | ICD-10-CM | POA: Diagnosis not present

## 2014-07-04 DIAGNOSIS — I679 Cerebrovascular disease, unspecified: Secondary | ICD-10-CM | POA: Diagnosis not present

## 2014-07-04 DIAGNOSIS — E1065 Type 1 diabetes mellitus with hyperglycemia: Secondary | ICD-10-CM

## 2014-07-04 HISTORY — DX: Dizziness and giddiness: R42

## 2014-07-04 LAB — COMPREHENSIVE METABOLIC PANEL
ALT: 12 U/L (ref 0–35)
AST: 28 U/L (ref 0–37)
Albumin: 3.8 g/dL (ref 3.5–5.2)
Alkaline Phosphatase: 124 U/L — ABNORMAL HIGH (ref 39–117)
Anion gap: 10 (ref 5–15)
BUN: 21 mg/dL (ref 6–23)
CO2: 24 mmol/L (ref 19–32)
Calcium: 9.5 mg/dL (ref 8.4–10.5)
Chloride: 105 mmol/L (ref 96–112)
Creatinine, Ser: 1.08 mg/dL (ref 0.50–1.10)
GFR calc Af Amer: 55 mL/min — ABNORMAL LOW (ref >90)
GFR calc non Af Amer: 48 mL/min — ABNORMAL LOW (ref >90)
Glucose, Bld: 77 mg/dL (ref 70–99)
Potassium: 3.2 mmol/L — ABNORMAL LOW (ref 3.5–5.1)
Sodium: 139 mmol/L (ref 135–145)
Total Bilirubin: 0.6 mg/dL (ref 0.3–1.2)
Total Protein: 7.8 g/dL (ref 6.0–8.3)

## 2014-07-04 LAB — TROPONIN I
Troponin I: <0.03 ng/mL (ref <0.031)
Troponin I: <0.03 ng/mL (ref <0.031)

## 2014-07-04 LAB — URINALYSIS, ROUTINE W REFLEX MICROSCOPIC
Bilirubin Urine: NEGATIVE
Glucose, UA: 500 mg/dL — AB
Ketones, ur: NEGATIVE mg/dL
Nitrite: NEGATIVE
Protein, ur: 100 mg/dL — AB
Specific Gravity, Urine: 1.025 (ref 1.005–1.030)
Urobilinogen, UA: 0.2 mg/dL (ref 0.0–1.0)
pH: 6 (ref 5.0–8.0)

## 2014-07-04 LAB — URINE MICROSCOPIC-ADD ON

## 2014-07-04 LAB — PROTIME-INR
INR: 0.94 (ref 0.00–1.49)
Prothrombin Time: 12.7 seconds (ref 11.6–15.2)

## 2014-07-04 LAB — CBC
HCT: 38 % (ref 36.0–46.0)
Hemoglobin: 12.9 g/dL (ref 12.0–15.0)
MCH: 27.7 pg (ref 26.0–34.0)
MCHC: 33.9 g/dL (ref 30.0–36.0)
MCV: 81.5 fL (ref 78.0–100.0)
Platelets: 169 10*3/uL (ref 150–400)
RBC: 4.66 MIL/uL (ref 3.87–5.11)
RDW: 12.6 % (ref 11.5–15.5)
WBC: 5.8 10*3/uL (ref 4.0–10.5)

## 2014-07-04 LAB — APTT: aPTT: 32 seconds (ref 24–37)

## 2014-07-04 LAB — DIFFERENTIAL
Basophils Absolute: 0 10*3/uL (ref 0.0–0.1)
Basophils Relative: 0 % (ref 0–1)
Eosinophils Absolute: 0 10*3/uL (ref 0.0–0.7)
Eosinophils Relative: 0 % (ref 0–5)
Lymphocytes Relative: 33 % (ref 12–46)
Lymphs Abs: 1.9 10*3/uL (ref 0.7–4.0)
Monocytes Absolute: 0.5 10*3/uL (ref 0.1–1.0)
Monocytes Relative: 8 % (ref 3–12)
Neutro Abs: 3.4 10*3/uL (ref 1.7–7.7)
Neutrophils Relative %: 59 % (ref 43–77)

## 2014-07-04 LAB — TSH: TSH: 1.485 u[IU]/mL (ref 0.350–4.500)

## 2014-07-04 LAB — CBG MONITORING, ED: Glucose-Capillary: 79 mg/dL (ref 70–99)

## 2014-07-04 LAB — GLUCOSE, CAPILLARY: Glucose-Capillary: 149 mg/dL — ABNORMAL HIGH (ref 70–99)

## 2014-07-04 MED ORDER — IRBESARTAN 300 MG PO TABS
300.0000 mg | ORAL_TABLET | Freq: Every day | ORAL | Status: DC
  Administered 2014-07-05 – 2014-07-06 (×2): 300 mg via ORAL
  Filled 2014-07-04 (×2): qty 1

## 2014-07-04 MED ORDER — FUROSEMIDE 80 MG PO TABS
80.0000 mg | ORAL_TABLET | Freq: Every day | ORAL | Status: DC
  Administered 2014-07-05 – 2014-07-06 (×2): 80 mg via ORAL
  Filled 2014-07-04 (×2): qty 1

## 2014-07-04 MED ORDER — HYDRALAZINE HCL 20 MG/ML IJ SOLN
10.0000 mg | Freq: Four times a day (QID) | INTRAMUSCULAR | Status: DC | PRN
  Administered 2014-07-04: 10 mg via INTRAVENOUS
  Filled 2014-07-04 (×2): qty 1

## 2014-07-04 MED ORDER — ONDANSETRON HCL 4 MG/2ML IJ SOLN
4.0000 mg | Freq: Four times a day (QID) | INTRAMUSCULAR | Status: DC | PRN
  Administered 2014-07-05 (×2): 4 mg via INTRAVENOUS
  Filled 2014-07-04 (×3): qty 2

## 2014-07-04 MED ORDER — INSULIN ASPART 100 UNIT/ML ~~LOC~~ SOLN: 0.0000 [IU] | Freq: Every day | SUBCUTANEOUS | Status: DC

## 2014-07-04 MED ORDER — FUROSEMIDE 80 MG PO TABS: 80.0000 mg | ORAL_TABLET | Freq: Every day | ORAL | Status: DC

## 2014-07-04 MED ORDER — POTASSIUM CHLORIDE CRYS ER 10 MEQ PO TBCR: 10.0000 meq | EXTENDED_RELEASE_TABLET | Freq: Every day | ORAL | Status: DC

## 2014-07-04 MED ORDER — POTASSIUM CHLORIDE CRYS ER 10 MEQ PO TBCR
10.0000 meq | EXTENDED_RELEASE_TABLET | Freq: Every day | ORAL | Status: DC
  Administered 2014-07-05 – 2014-07-06 (×2): 10 meq via ORAL
  Filled 2014-07-04 (×2): qty 1

## 2014-07-04 MED ORDER — HYDROCHLOROTHIAZIDE 25 MG PO TABS
25.0000 mg | ORAL_TABLET | Freq: Every day | ORAL | Status: DC
  Administered 2014-07-05 – 2014-07-06 (×2): 25 mg via ORAL
  Filled 2014-07-04 (×2): qty 1

## 2014-07-04 MED ORDER — PANTOPRAZOLE SODIUM 40 MG PO TBEC
40.0000 mg | DELAYED_RELEASE_TABLET | Freq: Every day | ORAL | Status: DC
  Administered 2014-07-05 – 2014-07-06 (×2): 40 mg via ORAL
  Filled 2014-07-04 (×2): qty 1

## 2014-07-04 MED ORDER — VALSARTAN-HYDROCHLOROTHIAZIDE 320-25 MG PO TABS
1.0000 | ORAL_TABLET | Freq: Every day | ORAL | Status: DC
Start: 2014-07-04 — End: 2014-07-04

## 2014-07-04 MED ORDER — PANTOPRAZOLE SODIUM 40 MG PO TBEC: 40.0000 mg | DELAYED_RELEASE_TABLET | Freq: Every day | ORAL | Status: DC

## 2014-07-04 MED ORDER — HEPARIN SODIUM (PORCINE) 5000 UNIT/ML IJ SOLN
5000.0000 [IU] | Freq: Three times a day (TID) | INTRAMUSCULAR | Status: DC
  Administered 2014-07-04 – 2014-07-06 (×6): 5000 [IU] via SUBCUTANEOUS
  Filled 2014-07-04 (×6): qty 1

## 2014-07-04 MED ORDER — ONDANSETRON HCL 4 MG PO TABS: 4.0000 mg | ORAL_TABLET | Freq: Four times a day (QID) | ORAL | Status: DC | PRN

## 2014-07-04 MED ORDER — INSULIN DETEMIR 100 UNIT/ML ~~LOC~~ SOLN
40.0000 [IU] | Freq: Every day | SUBCUTANEOUS | Status: DC
  Administered 2014-07-04 – 2014-07-05 (×2): 40 [IU] via SUBCUTANEOUS
  Filled 2014-07-04 (×4): qty 0.4

## 2014-07-04 MED ORDER — AMLODIPINE BESYLATE 5 MG PO TABS
10.0000 mg | ORAL_TABLET | Freq: Every day | ORAL | Status: DC
  Administered 2014-07-05 – 2014-07-06 (×2): 10 mg via ORAL
  Filled 2014-07-04 (×2): qty 2

## 2014-07-04 MED ORDER — INSULIN ASPART 100 UNIT/ML ~~LOC~~ SOLN
0.0000 [IU] | Freq: Three times a day (TID) | SUBCUTANEOUS | Status: DC
  Administered 2014-07-05 – 2014-07-06 (×5): 7 [IU] via SUBCUTANEOUS

## 2014-07-04 MED ORDER — PRAVASTATIN SODIUM 40 MG PO TABS
80.0000 mg | ORAL_TABLET | Freq: Every day | ORAL | Status: DC
  Administered 2014-07-04 – 2014-07-06 (×3): 80 mg via ORAL
  Filled 2014-07-04 (×3): qty 2

## 2014-07-04 MED ORDER — SODIUM CHLORIDE 0.9 % IV SOLN
INTRAVENOUS | Status: DC
Start: 2014-07-04 — End: 2014-07-06
  Administered 2014-07-04 – 2014-07-06 (×3): via INTRAVENOUS

## 2014-07-04 MED ORDER — ASPIRIN EC 81 MG PO TBEC: 81.0000 mg | DELAYED_RELEASE_TABLET | Freq: Every day | ORAL | Status: DC

## 2014-07-04 MED ORDER — LABETALOL HCL 5 MG/ML IV SOLN
20.0000 mg | Freq: Once | INTRAVENOUS | Status: AC
  Administered 2014-07-04: 20 mg via INTRAVENOUS
  Filled 2014-07-04: qty 4

## 2014-07-04 MED ORDER — AMLODIPINE BESYLATE 5 MG PO TABS: 10.0000 mg | ORAL_TABLET | Freq: Every day | ORAL | Status: DC

## 2014-07-04 MED ORDER — LABETALOL HCL 200 MG PO TABS
300.0000 mg | ORAL_TABLET | Freq: Two times a day (BID) | ORAL | Status: DC
Start: 2014-07-04 — End: 2014-07-06
  Administered 2014-07-04 – 2014-07-06 (×4): 300 mg via ORAL
  Filled 2014-07-04 (×2): qty 1
  Filled 2014-07-04 (×3): qty 2

## 2014-07-04 MED ORDER — SODIUM CHLORIDE 0.9 % IJ SOLN
3.0000 mL | Freq: Two times a day (BID) | INTRAMUSCULAR | Status: DC
Start: 2014-07-04 — End: 2014-07-06
  Administered 2014-07-04: 3 mL via INTRAVENOUS

## 2014-07-04 NOTE — ED Notes (Addendum)
Patient brought in by EMS with altered mental status. Per EMS patient found in floor in vomit and incontinent at 1545 today. States last time known well was 0745 this morning. Patient denies pain at this time. Patient is disoriented to time and situation. Per EMS, patient had right facial droop upon arrival to home but has resolved prior to arrival. States patient was orthostatic at home. Patient was given zofran, 4mg , IV per EMS en route to ED.

## 2014-07-04 NOTE — H&P (Signed)
[facility] History and Physical  Kaelei Korman [id number] DOB: [birth year] DOA: 07/04/2014  Referring physician: ER PCP: Foye Spurling, MD   Chief Complaint: Altered mental status  HPI: Glenda [name] is a 81 y.o. female  This is a 81 year old lady who apparently was found on the floor at approximately 3:30 PM today. She was seen last time in a normal state at 2:30 PM. When she was found, she was vomiting. Apparently her speech was slurred and there was a facial droop noted. In the emergency room, there was no facial droop appreciated and she appears to be back to her baseline with no further confusion. She denies any chest pain, dyspnea, palpitations or limb weakness. There has been some dizziness on standing. She is diabetic, hypertensive, has a history of hyperlipidemia and has coronary artery disease. She is now being admitted for further management.   Review of Systems:  Apart from symptoms above, all systems negative.  Past Medical History  Diagnosis Date  . GERD (gastroesophageal reflux disease)   . Obesity   . DM (diabetes mellitus)   . HTN (hypertension)   . HLD (hyperlipidemia)   . CAD (coronary artery disease)   . UTI (urinary tract infection)   . Chest pain   . Dizziness    Past Surgical History  Procedure Laterality Date  . Cholecystectomy    . Colon polyps; hx    . Coronary angioplasty  02/14/09   Social History:  reports that she has never smoked. She does not have any smokeless tobacco history on file. She reports that she does not drink alcohol or use illicit drugs.  No Known Allergies  Family History  Problem Relation Age of Onset  . Diabetes Mother   . Cancer Father   . Diabetes    . Heart attack    . Diabetes Sister     TWIN    Prior to Admission medications   Medication Sig Start Date End Date Taking? Authorizing Provider  amLODipine (NORVASC) 10 MG tablet Take 10 mg by mouth daily.   Yes Historical Provider, MD  aspirin 81 MG  EC tablet Take 81 mg by mouth daily.     Yes Historical Provider, MD  furosemide (LASIX) 80 MG tablet Take 80 mg by mouth daily.   Yes Historical Provider, MD  HUMALOG KWIKPEN 100 UNIT/ML KiwkPen Inject 20 Units into the skin 3 (three) times daily after meals.  04/30/14  Yes Historical Provider, MD  labetalol (NORMODYNE) 300 MG tablet Take 300 mg by mouth 2 (two) times daily.     Yes Historical Provider, MD  LEVEMIR FLEXTOUCH 100 UNIT/ML Pen Inject 40 Units into the skin daily at 10 pm.  11/21/13  Yes Historical Provider, MD  omeprazole (PRILOSEC) 20 MG capsule Take 20 mg by mouth every morning.    Yes Historical Provider, MD  potassium chloride (KLOR-CON) 10 MEQ CR tablet Take 10 mEq by mouth daily.     Yes Historical Provider, MD  pravastatin (PRAVACHOL) 80 MG tablet Take 1 tablet (80 mg total) by mouth daily. 04/28/11  Yes Renella Cunas, MD  valsartan-hydrochlorothiazide (DIOVAN-HCT) 320-25 MG per tablet Take 1 tablet by mouth daily.   Yes Historical Provider, MD   Physical Exam: Filed Vitals:   07/04/14 1730 07/04/14 1800 07/04/14 1813 07/04/14 1816  BP: 208/74 195/72  155/85  Pulse: 74 73  72  Temp:   97.8 F (36.6 C)   TempSrc:      Resp: 20 12  17  Height:    5\' 6"  (1.676 m)  Weight:    69.4 kg (153 lb)  SpO2: 99% 100%  100%    Wt Readings from Last 3 Encounters:  07/04/14 69.4 kg (153 lb)  01/17/14 70.308 kg (155 lb)  12/25/13 73.029 kg (161 lb)    General:  Appears calm and comfortable. She appears to be alert and orientated in time place and person at the present time. Eyes: PERRL, normal lids, irises & conjunctiva ENT: grossly normal hearing, lips & tongue Neck: no LAD, masses or thyromegaly Cardiovascular: RRR, no m/r/g. No LE edema. Telemetry: SR, no arrhythmias  Respiratory: CTA bilaterally, no w/r/r. Normal respiratory effort. Abdomen: soft, ntnd Skin: no rash or induration seen on limited exam Musculoskeletal: grossly normal tone BUE/BLE Psychiatric: grossly normal  mood and affect, speech fluent and appropriate Neurologic: grossly non-focal. I cannot appreciate any facial droop or any other focal deficits.           Labs on Admission:  Basic Metabolic Panel:  Recent Labs Lab 07/04/14 1737  NA 139  K 3.2*  CL 105  CO2 24  GLUCOSE 77  BUN 21  CREATININE 1.08  CALCIUM 9.5   Liver Function Tests:  Recent Labs Lab 07/04/14 1737  AST 28  ALT 12  ALKPHOS 124*  BILITOT 0.6  PROT 7.8  ALBUMIN 3.8   No results for input(s): LIPASE, AMYLASE in the last 168 hours. No results for input(s): AMMONIA in the last 168 hours. CBC:  Recent Labs Lab 07/04/14 1737  WBC 5.8  NEUTROABS 3.4  HGB 12.9  HCT 38.0  MCV 81.5  PLT 169   Cardiac Enzymes:  Recent Labs Lab 07/04/14 1737  TROPONINI <0.03    BNP (last 3 results) No results for input(s): BNP in the last 8760 hours.  ProBNP (last 3 results) No results for input(s): PROBNP in the last 8760 hours.  CBG:  Recent Labs Lab 07/04/14 1736  GLUCAP 79    Radiological Exams on Admission: Ct Head (brain) Wo Contrast  07/04/2014   CLINICAL DATA:  Found down at home. Incontinent with vomiting. Mental status change with right facial droop. History of hypertension and diabetes. Initial encounter.  EXAM: CT HEAD WITHOUT CONTRAST  TECHNIQUE: Contiguous axial images were obtained from the base of the skull through the vertex without intravenous contrast.  COMPARISON:  Head CT 04/26/2009.  MRI brain 09/21/2011.  FINDINGS: There is no evidence of acute intracranial hemorrhage, mass lesion, brain edema or extra-axial fluid collection. The ventricles and subarachnoid spaces are appropriately sized for age. There is no CT evidence of acute cortical infarction. Minimal periventricular white matter disease appears unchanged. Low-density posteriorly in the right thalamus is unchanged. Intracranial vascular calcifications are noted.  The visualized paranasal sinuses, mastoid air cells and middle ears are  clear. Postsurgical changes are present within the right mastoid air cells. The calvarium is intact.  IMPRESSION: No acute intracranial findings identified. Stable small vessel ischemic changes.   Electronically Signed   By: Richardean Sale M.D.   On: 07/04/2014 17:49    EKG: Independently reviewed. Normal sinus rhythm without any acute ST-T wave changes.  Assessment/Plan   1. Syncope. The etiology of her syncopal episode and altered mental status subsequently is unclear. She possibly has had a TIA but her symptoms could represent hypoglycemia although none was noted. She does not have any clear focal neurological deficits. Initial troponin is negative. We will investigated with serial troponin levels, MRI brain scan and we  will ask neurology to see her for further evaluation. 2. Hypertension, stable. 3. Diabetes. Continue with home insulin and sliding scale.  Further recommendations will depend on patient's hospital progress.   Code Status: Full code.   DVT Prophylaxis: Heparin.  Family Communication: I discussed the plan with the patient and family at the bedside.   Disposition Plan: Home when medically stable.   Time spent: 60 minutes.  Doree Albee [facility] Pager [phone].

## 2014-07-04 NOTE — ED Provider Notes (Signed)
CSN: [id number]     Arrival date & time 07/04/14  1646 History  This chart was scribed for Virgel Manifold, MD by Delphia Grates, ED Scribe. This patient was seen in room APA09/APA09 and the patient's care was started at 5:29 PM.    Chief Complaint  Patient presents with  . Altered Mental Status    The history is provided by the patient and a relative. No language interpreter was used.     HPI Comments: Glenda [name] is a 81 y.o. female, with history of , GERD, DM, HTN, HLD, CAD, UTI, chest pain and dizziness, brought in by ambulance, who presents to the Emergency Department complaining of altered mental status. Family states she heard the patient fall and found her on the floor, vomiting approximately 2 hours ago at 1530. She reports the patient was fine was last seen normal 3 hours ago at 1430. Family denies any precipitating factors prior to fall. They report the patient is below baseline at this time, noting her slurred speech and poor memory. Family states the patient had a recent check up with PCP which was normal. They note the patient sometime ambulates with a walker. They also mention she had a fluctuating blood sugar upon arrival of EMS. Per nursing note, Last seen normal at ~0800  Patient is unsure of what happened and amnestic to the events. She complains of associated nausea, decreased appetite, and dizziness. She reports history of vertigo and states this dizziness feels similar.  She denies vomiting, blurred vision, SOB, numbness/weakness, paresthesia, or any other symptoms at this time.   PCP: Alroy Dust at [facility]  Past Medical History  Diagnosis Date  . GERD (gastroesophageal reflux disease)   . Obesity   . DM (diabetes mellitus)   . HTN (hypertension)   . HLD (hyperlipidemia)   . CAD (coronary artery disease)   . UTI (urinary tract infection)   . Chest pain   . Dizziness    Past Surgical History  Procedure Laterality Date  . Cholecystectomy    . Colon polyps; hx     . Coronary angioplasty  02/14/09   Family History  Problem Relation Age of Onset  . Diabetes Mother   . Cancer Father   . Diabetes    . Heart attack    . Diabetes Sister     TWIN   History  Substance Use Topics  . Smoking status: Never Smoker   . Smokeless tobacco: Not on file  . Alcohol Use: No   OB History    No data available     Review of Systems  All systems reviewed and negative, other than as noted in HPI.   Allergies  Review of patient's allergies indicates no known allergies.  Home Medications   Prior to Admission medications   Medication Sig Start Date End Date Taking? Authorizing Provider  aspirin 81 MG EC tablet Take 81 mg by mouth daily.      Historical Provider, MD  FREESTYLE LITE test strip  05/22/13   Historical Provider, MD  furosemide (LASIX) 80 MG tablet Take 80 mg by mouth daily.    Historical Provider, MD  Insulin Aspart (NOVOLOG FLEXPEN [place]) Inject 40 Units into the skin as directed.    Historical Provider, MD  labetalol (NORMODYNE) 300 MG tablet Take 300 mg by mouth 2 (two) times daily.      Historical Provider, MD  LEVEMIR FLEXTOUCH 100 UNIT/ML Pen 25 Units 2 (two) times daily. 11/21/13   Historical Provider, MD  olmesartan-hydrochlorothiazide (BENICAR HCT) 40-25 MG per tablet Take 1 tablet by mouth daily.    Historical Provider, MD  omeprazole (PRILOSEC) 20 MG capsule Take 20 mg by mouth daily.      Historical Provider, MD  potassium chloride (KLOR-CON) 10 MEQ CR tablet Take 10 mEq by mouth daily.      Historical Provider, MD  pravastatin (PRAVACHOL) 80 MG tablet Take 1 tablet (80 mg total) by mouth daily. 04/28/11   Renella Cunas, MD  traMADol Veatrice [place]bon) 50 MG tablet  05/07/13   Historical Provider, MD   Triage Vitals: BP 201/102 mmHg  Pulse 74  Temp(Src) 97.7 F (36.5 C) (Oral)  Resp 20  Ht 5\' 5"  (1.651 m)  Wt 160 lb (72.576 kg)  BMI 26.63 kg/m2  SpO2 99%  Physical Exam  Constitutional: She appears well-developed and well-nourished. No  distress.  HENT:  Head: Normocephalic and atraumatic.  Right Ear: External ear normal.  Left Ear: External ear normal.  Eyes: Conjunctivae are normal. Right eye exhibits no discharge. Left eye exhibits no discharge. No scleral icterus.  Neck: Neck supple. No tracheal deviation present.  Cardiovascular: Normal rate, regular rhythm and intact distal pulses.   Pulmonary/Chest: Effort normal and breath sounds normal. No stridor. No respiratory distress. She has no wheezes. She has no rales.  Abdominal: Soft. Bowel sounds are normal. She exhibits no distension. There is no tenderness. There is no rebound and no guarding.  Musculoskeletal: She exhibits no edema or tenderness.  Neurological: She is alert. She has normal strength. No cranial nerve deficit (no facial droop, extraocular movements intact) or sensory deficit. She exhibits normal muscle tone. She displays no seizure activity. Coordination normal.  Speech is somewhat slurred, but understandable. strength in normal in upper and lower extremities. Good finger-to-nose test.  Skin: Skin is warm and dry. No rash noted.  Psychiatric: She has a normal mood and affect.  Nursing note and vitals reviewed.   ED Course  Procedures (including critical care time)  DIAGNOSTIC STUDIES: Oxygen Saturation is 99% on room air, normal by my interpretation.    COORDINATION OF CARE: At 1735 Discussed treatment plan with patient and family which includes imaging. Patient and family agree.   Labs Review Labs Reviewed  COMPREHENSIVE METABOLIC PANEL - Abnormal; Notable for the following:    Potassium 3.2 (*)    Alkaline Phosphatase 124 (*)    GFR calc non Af Amer 48 (*)    GFR calc Af Amer 55 (*)    All other components within normal limits  URINALYSIS, ROUTINE W REFLEX MICROSCOPIC - Abnormal; Notable for the following:    APPearance CLOUDY (*)    Glucose, UA 500 (*)    Hgb urine dipstick MODERATE (*)    Protein, ur 100 (*)    Leukocytes, UA MODERATE  (*)    All other components within normal limits  COMPREHENSIVE METABOLIC PANEL - Abnormal; Notable for the following:    Glucose, Bld 262 (*)    Creatinine, Ser 1.13 (*)    Albumin 3.2 (*)    GFR calc non Af Amer 45 (*)    GFR calc Af Amer 53 (*)    All other components within normal limits  CBC - Abnormal; Notable for the following:    Hemoglobin 11.1 (*)    HCT 32.5 (*)    All other components within normal limits  URINE MICROSCOPIC-ADD ON - Abnormal; Notable for the following:    Squamous Epithelial / LPF FEW (*)  Bacteria, UA MANY (*)    All other components within normal limits  GLUCOSE, CAPILLARY - Abnormal; Notable for the following:    Glucose-Capillary 149 (*)    All other components within normal limits  GLUCOSE, CAPILLARY - Abnormal; Notable for the following:    Glucose-Capillary 244 (*)    All other components within normal limits  HEMOGLOBIN A1C - Abnormal; Notable for the following:    Hgb A1c MFr Bld 13.1 (*)    All other components within normal limits  GLUCOSE, CAPILLARY - Abnormal; Notable for the following:    Glucose-Capillary 204 (*)    All other components within normal limits  GLUCOSE, CAPILLARY - Abnormal; Notable for the following:    Glucose-Capillary 227 (*)    All other components within normal limits  GLUCOSE, CAPILLARY - Abnormal; Notable for the following:    Glucose-Capillary 189 (*)    All other components within normal limits  GLUCOSE, CAPILLARY - Abnormal; Notable for the following:    Glucose-Capillary 108 (*)    All other components within normal limits  GLUCOSE, CAPILLARY - Abnormal; Notable for the following:    Glucose-Capillary 202 (*)    All other components within normal limits  GLUCOSE, CAPILLARY - Abnormal; Notable for the following:    Glucose-Capillary 234 (*)    All other components within normal limits  URINE CULTURE  PROTIME-INR  APTT  CBC  DIFFERENTIAL  TROPONIN I  TSH  TROPONIN I  TROPONIN I  LIPID PANEL   VITAMIN B12  HOMOCYSTEINE  CBG MONITORING, ED    Imaging Review No results found.  Ct Head (brain) Wo Contrast  07/04/2014   CLINICAL DATA:  Found down at home. Incontinent with vomiting. Mental status change with right facial droop. History of hypertension and diabetes. Initial encounter.  EXAM: CT HEAD WITHOUT CONTRAST  TECHNIQUE: Contiguous axial images were obtained from the base of the skull through the vertex without intravenous contrast.  COMPARISON:  Head CT 04/26/2009.  MRI brain 09/21/2011.  FINDINGS: There is no evidence of acute intracranial hemorrhage, mass lesion, brain edema or extra-axial fluid collection. The ventricles and subarachnoid spaces are appropriately sized for age. There is no CT evidence of acute cortical infarction. Minimal periventricular white matter disease appears unchanged. Low-density posteriorly in the right thalamus is unchanged. Intracranial vascular calcifications are noted.  The visualized paranasal sinuses, mastoid air cells and middle ears are clear. Postsurgical changes are present within the right mastoid air cells. The calvarium is intact.  IMPRESSION: No acute intracranial findings identified. Stable small vessel ischemic changes.   Electronically Signed   By: Richardean Sale M.D.   On: 07/04/2014 17:49   Mr Jodene Nam Head Wo Contrast  07/05/2014   CLINICAL DATA:  Left posterior limb internal capsule and lateral thalamus infarct. Abnormal MRI.  EXAM: MRA HEAD WITHOUT CONTRAST  TECHNIQUE: Angiographic images of the Circle of Willis were obtained using MRA technique without intravenous contrast.  COMPARISON:  MRI brain 07/04/2014  FINDINGS: The internal carotid arteries are within normal limits from the high cervical segments through the ICA termini bilaterally. The A1 and M1 segments are normal. Anterior communicating artery is not visualized. The MCA bifurcations are intact.  There is mild attenuation of distal MCA branch vessels bilaterally without a significant  proximal stenosis or occlusion.  The vertebral arteries are codominant. The right PICA origin is visualized and normal. The left AICA is dominant. The basilar artery is within normal limits. There is mild attenuation of P2 segments  without a significant stenosis. Distal PCA branch vessel attenuation is evident bilaterally.  IMPRESSION: 1. Mild to moderate distal small vessel disease without a significant proximal stenosis, aneurysm, or branch vessel occlusion.   Electronically Signed   By: San Morelle M.D.   On: 07/05/2014 12:43   Mr Brain Wo Contrast  07/04/2014   CLINICAL DATA:  81 year old female found down at 15:45. Right face droop. Initial encounter.  EXAM: MRI HEAD WITHOUT CONTRAST  TECHNIQUE: Multiplanar, multiecho pulse sequences of the brain and surrounding structures were obtained without intravenous contrast.  COMPARISON:  Head CT without contrast 1751 hours today. Brain MRI 09/21/2011.  FINDINGS: Stable cerebral volume since 2013. Major intracranial vascular flow voids are stable.  8 mm area of restricted diffusion in the lateral left thalamus near the posterior limb of the left internal capsule. Diffusion abnormality tracks toward the left cerebral peduncle. Associated T2 and FLAIR hyperintensity. No associated mass effect or hemorrhage.  No other restricted diffusion identified. No midline shift, mass effect, evidence of mass lesion, ventriculomegaly, extra-axial collection or acute intracranial hemorrhage. Cervicomedullary junction and pituitary are within normal limits. Chronic dorsal thalamic lacunar infarcts greater on the right. Scattered cerebral white matter T2 and FLAIR hyperintensity is not significantly changed. Moderate patchy signal abnormality in the pons is stable to mildly progressed.  Visible internal auditory structures appear normal. Chronic mild mastoid effusions. Mild paranasal sinus mucosal thickening. Stable orbits soft tissues. Visualized scalp soft tissues are  within normal limits. Stable visualized cervical spine. Bone marrow signal within normal limits.  IMPRESSION: 1. Acute lacunar infarct affecting the dorsal left thalamus and posterior limb of the left internal capsule. No mass effect or hemorrhage. 2. Other chronic small vessel disease is stable to mildly progressed since 2013.   Electronically Signed   By: Genevie Ann M.D.   On: 07/04/2014 20:34   US Carotid Bilateral  07/05/2014   CLINICAL DATA:  History of hypertension, syncopal episode, CAD, hyperlipidemia and diabetes.  EXAM: BILATERAL CAROTID DUPLEX ULTRASOUND  TECHNIQUE: Pearline Cables scale imaging, color Doppler and duplex ultrasound were performed of bilateral carotid and vertebral arteries in the neck.  COMPARISON:  Brain MRI / MRA - 07/05/2014  FINDINGS: Criteria: Quantification of carotid stenosis is based on velocity parameters that correlate the residual internal carotid diameter with NASCET-based stenosis levels, using the diameter of the distal internal carotid lumen as the denominator for stenosis measurement.  The following velocity measurements were obtained:  RIGHT  ICA:  75/16 cm/sec  CCA:  123456 cm/sec  SYSTOLIC ICA/CCA RATIO:  1.0  DIASTOLIC ICA/CCA RATIO:  1.5  ECA:  150 cm/sec  LEFT  ICA:  83/20 cm/sec  CCA:  [id number] cm/sec  SYSTOLIC ICA/CCA RATIO:  1.3  DIASTOLIC ICA/CCA RATIO:  1.9  ECA:  114 cm/sec  RIGHT CAROTID ARTERY: There is a minimal amount of eccentric mixed echogenic plaque within the right carotid bulb (image 19), extending to involve the origin and proximal aspects of the right internal carotid artery (image 28), not resulting in elevated peak systolic velocities within the interrogated course of the right internal carotid artery to suggest a hemodynamically significant stenosis.  RIGHT VERTEBRAL ARTERY:  Antegrade flow  LEFT CAROTID ARTERY: There is a minimal amount of eccentric mixed echogenic plaque within the distal aspect the left common carotid artery (image 49). There is a minimal  amount of eccentric mixed echogenic plaque within the left carotid bulb (image 52 and 54), extending to involve the origin and proximal aspects of the left  internal carotid artery (image 64), not resulting in elevated peak systolic velocities within the interrogated course of the left internal carotid artery to suggest a hemodynamically significant stenosis.  LEFT VERTEBRAL ARTERY:  Antegrade thigh  Incidental note is made of a punctate (approximately 0.2 cm) anechoic cyst within the incidentally imaged left lobe of the thyroid (image 40).  IMPRESSION: Minimal amount of bilateral atherosclerotic plaque, left subjectively greater than right, not resulting in a hemodynamically significant stenosis.   Electronically Signed   By: Sandi Mariscal M.D.   On: 07/05/2014 15:26     EKG Interpretation None      MDM   Final diagnoses:  Slurred speech  Non-intractable vomiting with nausea, vomiting of unspecified type  Fall, initial encounter    80yF presenting after fall. Not clear of precipitating factor. Hx of vertigo and somewhat similar symptoms currently. Could potentially explain fall and n/v, but doesn't explain slurred speech.  Concern for possible TIA. Admit for further evaluation.  I personally preformed the services scribed in my presence. The recorded information has been reviewed is accurate. Virgel Manifold, MD.    Virgel Manifold, MD 07/08/14 [phone]

## 2014-07-04 NOTE — ED Notes (Signed)
Barbaraann Faster, RN attempted to call report. RN unable to take report at this time.

## 2014-07-05 ENCOUNTER — Inpatient Hospital Stay (HOSPITAL_COMMUNITY): Payer: Medicare Other

## 2014-07-05 ENCOUNTER — Inpatient Hospital Stay (HOSPITAL_COMMUNITY)
Admit: 2014-07-05 | Discharge: 2014-07-05 | Disposition: A | Discharge status: Home / Self Care | Payer: Medicare Other | Attending: Neurology | Admitting: Neurology

## 2014-07-05 DIAGNOSIS — I639 Cerebral infarction, unspecified: Secondary | ICD-10-CM | POA: Diagnosis present

## 2014-07-05 DIAGNOSIS — E785 Hyperlipidemia, unspecified: Secondary | ICD-10-CM | POA: Diagnosis present

## 2014-07-05 DIAGNOSIS — E118 Type 2 diabetes mellitus with unspecified complications: Secondary | ICD-10-CM

## 2014-07-05 LAB — COMPREHENSIVE METABOLIC PANEL
ALK PHOS: 102 U/L (ref 39–117)
ALT: 11 U/L (ref 0–35)
AST: 21 U/L (ref 0–37)
Albumin: 3.2 g/dL — ABNORMAL LOW (ref 3.5–5.2)
Anion gap: 7 (ref 5–15)
BUN: 21 mg/dL (ref 6–23)
CALCIUM: 8.7 mg/dL (ref 8.4–10.5)
CO2: 24 mmol/L (ref 19–32)
CREATININE: 1.13 mg/dL — AB (ref 0.50–1.10)
Chloride: 107 mmol/L (ref 96–112)
GFR calc Af Amer: 53 mL/min — ABNORMAL LOW (ref >90)
GFR, EST NON AFRICAN AMERICAN: 45 mL/min — AB (ref >90)
Glucose, Bld: 262 mg/dL — ABNORMAL HIGH (ref 70–99)
Potassium: 3.9 mmol/L (ref 3.5–5.1)
Sodium: 138 mmol/L (ref 135–145)
Total Bilirubin: 0.7 mg/dL (ref 0.3–1.2)
Total Protein: 6.5 g/dL (ref 6.0–8.3)

## 2014-07-05 LAB — CBC
HCT: 32.5 % — ABNORMAL LOW (ref 36.0–46.0)
HEMOGLOBIN: 11.1 g/dL — AB (ref 12.0–15.0)
MCH: 27.3 pg (ref 26.0–34.0)
MCHC: 34.2 g/dL (ref 30.0–36.0)
MCV: 80 fL (ref 78.0–100.0)
PLATELETS: 182 10*3/uL (ref 150–400)
RBC: 4.06 MIL/uL (ref 3.87–5.11)
RDW: 12.3 % (ref 11.5–15.5)
WBC: 6.1 10*3/uL (ref 4.0–10.5)

## 2014-07-05 LAB — TROPONIN I: Troponin I: <0.03 ng/mL (ref <0.031)

## 2014-07-05 LAB — LIPID PANEL
CHOL/HDL RATIO: 2.4 ratio
CHOLESTEROL: 131 mg/dL (ref 0–200)
HDL: 55 mg/dL (ref >39)
LDL Cholesterol: 49 mg/dL (ref 0–99)
Triglycerides: 134 mg/dL (ref <150)
VLDL: 27 mg/dL (ref 0–40)

## 2014-07-05 LAB — GLUCOSE, CAPILLARY
GLUCOSE-CAPILLARY: 244 mg/dL — AB (ref 70–99)
GLUCOSE-CAPILLARY: 227 mg/dL — AB (ref 70–99)
Glucose-Capillary: 204 mg/dL — ABNORMAL HIGH (ref 70–99)
Glucose-Capillary: 189 mg/dL — ABNORMAL HIGH (ref 70–99)

## 2014-07-05 LAB — VITAMIN B12: VITAMIN B 12: 314 pg/mL (ref 211–911)

## 2014-07-05 MED ORDER — CEFTRIAXONE SODIUM IN DEXTROSE 20 MG/ML IV SOLN
1.0000 g | INTRAVENOUS | Status: DC
  Administered 2014-07-05: 1 g via INTRAVENOUS
  Filled 2014-07-05 (×3): qty 50

## 2014-07-05 MED ORDER — DEXTROSE 5 % IV SOLN
INTRAVENOUS | Status: AC
  Filled 2014-07-05: qty 10

## 2014-07-05 MED ORDER — STROKE: EARLY STAGES OF RECOVERY BOOK
Freq: Once | Status: AC
Start: 2014-07-05 — End: 2014-07-05
  Administered 2014-07-05: 1
  Filled 2014-07-05: qty 1

## 2014-07-05 MED ORDER — CLOPIDOGREL BISULFATE 75 MG PO TABS
75.0000 mg | ORAL_TABLET | Freq: Every day | ORAL | Status: DC
  Administered 2014-07-06: 75 mg via ORAL
  Filled 2014-07-05: qty 1

## 2014-07-05 MED ORDER — ASPIRIN 325 MG PO TABS
325.0000 mg | ORAL_TABLET | Freq: Every day | ORAL | Status: DC
  Administered 2014-07-05 – 2014-07-06 (×2): 325 mg via ORAL
  Filled 2014-07-05 (×2): qty 1

## 2014-07-05 MED ORDER — PROMETHAZINE HCL 25 MG/ML IJ SOLN
12.5000 mg | Freq: Four times a day (QID) | INTRAMUSCULAR | Status: DC | PRN
  Administered 2014-07-05: 12.5 mg via INTRAVENOUS
  Filled 2014-07-05: qty 1

## 2014-07-05 MED ORDER — PROMETHAZINE HCL 25 MG/ML IJ SOLN
25.0000 mg | Freq: Four times a day (QID) | INTRAMUSCULAR | Status: DC | PRN
Start: 2014-07-05 — End: 2014-07-05
  Administered 2014-07-05: 25 mg via INTRAMUSCULAR
  Filled 2014-07-05: qty 1

## 2014-07-05 MED ORDER — MECLIZINE HCL 12.5 MG PO TABS
25.0000 mg | ORAL_TABLET | Freq: Three times a day (TID) | ORAL | Status: DC
  Administered 2014-07-05 – 2014-07-06 (×3): 25 mg via ORAL
  Filled 2014-07-05 (×3): qty 2

## 2014-07-05 NOTE — Care Management Note (Signed)
    Page 1 of 1   07/05/2014     4:06:52 PM CARE MANAGEMENT NOTE 07/05/2014  Patient:  Glenda [name], Glenda [name]   Account Number:  [id number]  Date Initiated:  07/05/2014  Documentation initiated by:  Jolene Provost  Subjective/Objective Assessment:   Pt is from home, lives with daughter. Pt is independent at baseline using a walker. Pt has no HH services prior to admission. Pt plans to dishcarge home with self care.     Action/Plan:   MD has ordered a shower chair. Pt has choosen [facility]. Emma of [facility] notified of order and will obtain pt information from chart and have DME delivered to pt's home. No further CM needs identified.   Anticipated DC Date:  07/07/2014   Anticipated DC Plan:  HOME/SELF CARE      DC Planning Services  CM consult      PAC Choice  DURABLE MEDICAL EQUIPMENT   Choice offered to / List presented to:  C-1 Patient   DME arranged  [place]      DME agency  [place].        Status of service:  Completed, signed off Medicare Important Message given?  YES (If response is "NO", the following Medicare IM given date fields will be blank) Date Medicare IM given:  07/05/2014 Medicare IM given by:  Jolene Provost Date Additional Medicare IM given:   Additional Medicare IM given by:    Discharge Disposition:  HOME/SELF CARE  Per UR Regulation:  Reviewed for med. necessity/level of care/duration of stay  If discussed at [place] of Stay Meetings, dates discussed:    Comments:  07/05/2014 [place], RN, MSN, CM

## 2014-07-05 NOTE — Progress Notes (Signed)
[facility] PROGRESS NOTE  Glenda [name] [id number] DOB: 05/19/1933 DOA: 07/04/2014 PCP: Foye Spurling, MD  Assessment/Plan: 1. CVA. This patient was admitted to the hospital with slurred speech and questionable facial droop. She was found down at home. At the time of ER evaluation, all her symptoms had resolved. Therefore she was not considered a candidate for TPA. She was found to have a small left lacunar infarct on MRI. MRA of the head, carotid Dopplers were unremarkable. Echocardiogram is currently pending. Lipid panel is acceptable. Hemoglobin A1c is pending. She was seen by neurology and started on Plavix in addition to increasing to full dose aspirin. Physical therapy consultations are pending. 2. Urinary tract infection. Will check urine culture and start on Rocephin 3. Hypertension Continue outpatient regimen for now. 4. Diabetes. Hemoglobin A1c is pending. Continue current treatments. 5. Vertigo. Possibly related to #1 versus #2. Will give trial of Antivert.  Code Status: Full code Family Communication: Discussed with patient and daughters at the bedside Disposition Plan: Discharge home once improved   Consultants:  Neurology  Procedures:    Antibiotics:  Rocephin 3/11  HPI/Subjective: Still has dizziness while standing. Has nausea and vomiting today.  Objective: Filed Vitals:   07/05/14 1548  BP: 161/59  Pulse: 75  Temp: 99 F (37.2 C)  Resp: 20    Intake/Output Summary (Last 24 hours) at 07/05/14 2000 Last data filed at 07/05/14 0600  Gross per 24 hour  Intake    545 ml  Output      0 ml  Net    545 ml   Filed Weights   07/04/14 1651 07/04/14 1816 07/04/14 2126  Weight: 72.576 kg (160 lb) 69.4 kg (153 lb) 72.394 kg (159 lb 9.6 oz)    Exam:   General:  NAD  Cardiovascular: S1, S2 RRR  Respiratory: CTAB  Abdomen: soft, nt, nd, bs+  Musculoskeletal: no edema b/l   Data Reviewed: Basic Metabolic Panel:  Recent Labs Lab  07/04/14 1737 07/05/14 0156  NA 139 138  K 3.2* 3.9  CL 105 107  CO2 24 24  GLUCOSE 77 262*  BUN 21 21  CREATININE 1.08 1.13*  CALCIUM 9.5 8.7   Liver Function Tests:  Recent Labs Lab 07/04/14 1737 07/05/14 0156  AST 28 21  ALT 12 11  ALKPHOS 124* 102  BILITOT 0.6 0.7  PROT 7.8 6.5  ALBUMIN 3.8 3.2*   No results for input(s): LIPASE, AMYLASE in the last 168 hours. No results for input(s): AMMONIA in the last 168 hours. CBC:  Recent Labs Lab 07/04/14 1737 07/05/14 0200  WBC 5.8 6.1  NEUTROABS 3.4  --   HGB 12.9 11.1*  HCT 38.0 32.5*  MCV 81.5 80.0  PLT 169 182   Cardiac Enzymes:  Recent Labs Lab 07/04/14 1737 07/04/14 2040 07/05/14 0156  TROPONINI <0.03 <0.03 <0.03   BNP (last 3 results) No results for input(s): BNP in the last 8760 hours.  ProBNP (last 3 results) No results for input(s): PROBNP in the last 8760 hours.  CBG:  Recent Labs Lab 07/04/14 1736 07/04/14 2125 07/05/14 0742 07/05/14 1251 07/05/14 1656  GLUCAP 79 149* 244* 204* 227*    No results found for this or any previous visit (from the past 240 hour(s)).   Studies: Ct Head (brain) Wo Contrast  07/04/2014   CLINICAL DATA:  Found down at home. Incontinent with vomiting. Mental status change with right facial droop. History of hypertension and diabetes. Initial encounter.  EXAM: CT HEAD WITHOUT  CONTRAST  TECHNIQUE: Contiguous axial images were obtained from the base of the skull through the vertex without intravenous contrast.  COMPARISON:  Head CT 04/26/2009.  MRI brain 09/21/2011.  FINDINGS: There is no evidence of acute intracranial hemorrhage, mass lesion, brain edema or extra-axial fluid collection. The ventricles and subarachnoid spaces are appropriately sized for age. There is no CT evidence of acute cortical infarction. Minimal periventricular white matter disease appears unchanged. Low-density posteriorly in the right thalamus is unchanged. Intracranial vascular calcifications  are noted.  The visualized paranasal sinuses, mastoid air cells and middle ears are clear. Postsurgical changes are present within the right mastoid air cells. The calvarium is intact.  IMPRESSION: No acute intracranial findings identified. Stable small vessel ischemic changes.   Electronically Signed   By: Richardean Sale M.D.   On: 07/04/2014 17:49   Mr Jodene Nam Head Wo Contrast  07/05/2014   CLINICAL DATA:  Left posterior limb internal capsule and lateral thalamus infarct. Abnormal MRI.  EXAM: MRA HEAD WITHOUT CONTRAST  TECHNIQUE: Angiographic images of the Circle of Willis were obtained using MRA technique without intravenous contrast.  COMPARISON:  MRI brain 07/04/2014  FINDINGS: The internal carotid arteries are within normal limits from the high cervical segments through the ICA termini bilaterally. The A1 and M1 segments are normal. Anterior communicating artery is not visualized. The MCA bifurcations are intact.  There is mild attenuation of distal MCA branch vessels bilaterally without a significant proximal stenosis or occlusion.  The vertebral arteries are codominant. The right PICA origin is visualized and normal. The left AICA is dominant. The basilar artery is within normal limits. There is mild attenuation of P2 segments without a significant stenosis. Distal PCA branch vessel attenuation is evident bilaterally.  IMPRESSION: 1. Mild to moderate distal small vessel disease without a significant proximal stenosis, aneurysm, or branch vessel occlusion.   Electronically Signed   By: San Morelle M.D.   On: 07/05/2014 12:43   Mr Brain Wo Contrast  07/04/2014   CLINICAL DATA:  81 year old female found down at 15:45. Right face droop. Initial encounter.  EXAM: MRI HEAD WITHOUT CONTRAST  TECHNIQUE: Multiplanar, multiecho pulse sequences of the brain and surrounding structures were obtained without intravenous contrast.  COMPARISON:  Head CT without contrast 1751 hours today. Brain MRI 09/21/2011.   FINDINGS: Stable cerebral volume since 2013. Major intracranial vascular flow voids are stable.  8 mm area of restricted diffusion in the lateral left thalamus near the posterior limb of the left internal capsule. Diffusion abnormality tracks toward the left cerebral peduncle. Associated T2 and FLAIR hyperintensity. No associated mass effect or hemorrhage.  No other restricted diffusion identified. No midline shift, mass effect, evidence of mass lesion, ventriculomegaly, extra-axial collection or acute intracranial hemorrhage. Cervicomedullary junction and pituitary are within normal limits. Chronic dorsal thalamic lacunar infarcts greater on the right. Scattered cerebral white matter T2 and FLAIR hyperintensity is not significantly changed. Moderate patchy signal abnormality in the pons is stable to mildly progressed.  Visible internal auditory structures appear normal. Chronic mild mastoid effusions. Mild paranasal sinus mucosal thickening. Stable orbits soft tissues. Visualized scalp soft tissues are within normal limits. Stable visualized cervical spine. Bone marrow signal within normal limits.  IMPRESSION: 1. Acute lacunar infarct affecting the dorsal left thalamus and posterior limb of the left internal capsule. No mass effect or hemorrhage. 2. Other chronic small vessel disease is stable to mildly progressed since 2013.   Electronically Signed   By: Herminio Heads.D.  On: 07/04/2014 20:34   US Carotid Bilateral  07/05/2014   CLINICAL DATA:  History of hypertension, syncopal episode, CAD, hyperlipidemia and diabetes.  EXAM: BILATERAL CAROTID DUPLEX ULTRASOUND  TECHNIQUE: Pearline Cables scale imaging, color Doppler and duplex ultrasound were performed of bilateral carotid and vertebral arteries in the neck.  COMPARISON:  Brain MRI / MRA - 07/05/2014  FINDINGS: Criteria: Quantification of carotid stenosis is based on velocity parameters that correlate the residual internal carotid diameter with NASCET-based stenosis  levels, using the diameter of the distal internal carotid lumen as the denominator for stenosis measurement.  The following velocity measurements were obtained:  RIGHT  ICA:  75/16 cm/sec  CCA:  123456 cm/sec  SYSTOLIC ICA/CCA RATIO:  1.0  DIASTOLIC ICA/CCA RATIO:  1.5  ECA:  150 cm/sec  LEFT  ICA:  83/20 cm/sec  CCA:  [id number] cm/sec  SYSTOLIC ICA/CCA RATIO:  1.3  DIASTOLIC ICA/CCA RATIO:  1.9  ECA:  114 cm/sec  RIGHT CAROTID ARTERY: There is a minimal amount of eccentric mixed echogenic plaque within the right carotid bulb (image 19), extending to involve the origin and proximal aspects of the right internal carotid artery (image 28), not resulting in elevated peak systolic velocities within the interrogated course of the right internal carotid artery to suggest a hemodynamically significant stenosis.  RIGHT VERTEBRAL ARTERY:  Antegrade flow  LEFT CAROTID ARTERY: There is a minimal amount of eccentric mixed echogenic plaque within the distal aspect the left common carotid artery (image 49). There is a minimal amount of eccentric mixed echogenic plaque within the left carotid bulb (image 52 and 54), extending to involve the origin and proximal aspects of the left internal carotid artery (image 64), not resulting in elevated peak systolic velocities within the interrogated course of the left internal carotid artery to suggest a hemodynamically significant stenosis.  LEFT VERTEBRAL ARTERY:  Antegrade thigh  Incidental note is made of a punctate (approximately 0.2 cm) anechoic cyst within the incidentally imaged left lobe of the thyroid (image 40).  IMPRESSION: Minimal amount of bilateral atherosclerotic plaque, left subjectively greater than right, not resulting in a hemodynamically significant stenosis.   Electronically Signed   By: Sandi Mariscal M.D.   On: 07/05/2014 15:26    Scheduled Meds: . amLODipine  10 mg Oral Daily  . aspirin  325 mg Oral Daily  . cefTRIAXone (ROCEPHIN)  IV  1 g Intravenous Q24H  . [START  ON 07/06/2014] clopidogrel  75 mg Oral Q breakfast  . furosemide  80 mg Oral Daily  . heparin  5,000 Units Subcutaneous 3 times per day  . irbesartan  300 mg Oral Daily   And  . hydrochlorothiazide  25 mg Oral Daily  . insulin aspart  0-20 Units Subcutaneous TID WC  . insulin aspart  0-5 Units Subcutaneous QHS  . insulin detemir  40 Units Subcutaneous Q2200  . labetalol  300 mg Oral BID  . pantoprazole  40 mg Oral Daily  . potassium chloride  10 mEq Oral Daily  . pravastatin  80 mg Oral q1800  . sodium chloride  3 mL Intravenous Q12H   Continuous Infusions: . sodium chloride 75 mL/hr at 07/05/14 1307    Active Problems:   Essential hypertension   Slurred speech   Syncope    Time spent: 68mins    Glenda [name]  [facility] Pager [phone]. If 7PM-7AM, please contact night-coverage at [email or web address], password [facility] 07/05/2014, 8:00 PM  LOS: 1 day

## 2014-07-05 NOTE — Consult Note (Signed)
Glenda A. Merlene Laughter, MD     [email or web address]          Glenda [name] is an 81 y.o. female.   ASSESSMENT/PLAN: 1. Episode of prolonged syncope associated with a amnesia, vertigo, Vomiting, headache, focal neurological symptoms and Imaging showed lacunar infarcts. The side of the patient's infarct is tiny and seems out of proportion to her symptoms. This is worrisome for more large vessel causes particular vertebrobasilar insufficiency. However, she also could have a anterior circulation intracranial occlusive disease. Risk factors age, hypertension, diabetes, coronary disease and dyslipidemia. Prolonged amnesia and prolonged syncopal episode is worrisome for seizures.   RECOMMENDATION: Agree with increase dose of aspirin 325. Plavix will be added for suspicion of significant intracranial occlusive disease. Avoid abrupt reduction in blood pressure. Additional blood tests from homocystine level and vitamin B12.  The patient is an 81 year old black female who was found unresponsive on the ground apparently vomiting. The patient may have been out for about an hour per estimation of the family. The patient is amnestic to the event and cannot provide a history. The patient has had a few episodes of nausea and vomiting since then. She was noted to have headache and focal right facial drooping. It appears that this drooping resolved and she may have been at baseline which she was seen in the emergency room although it she now reports having some numbness and weakness of the right side. It appears that she has been having episodic numbness and weakness along with some pain of the right side recently over the last several weeks. The patient is on 81 mg aspirin and has been compliant with this. She takes a uncoated aspirin. She returned me that her blood pressure has been fine at home. The patient just had another episode of nausea and vomiting before I came into the exam room. She  had a few of these events will last day. Patient denies chest pain, shows of breath, or GU symptoms. The review of systems is otherwise negative.  GENERAL: This is a very pleasant female in no acute distress.  HEENT: Supple. Atraumatic normocephalic.   ABDOMEN: soft  EXTREMITIES: No edema   BACK: Normal.  SKIN: Normal by inspection.    MENTAL STATUS: Alert and oriented -  Age is stated as 8, month March and the year 2016. Speech, language and cognition are generally intact. Judgment and insight normal.   CRANIAL NERVES: Pupils are equal, round and reactive to light and accommodation; extra ocular movements are full, there is no significant nystagmus; visual fields are full- Essentially but she seems to have loss of peripheral vision bilaterally especially involving the lower quadrants; upper and lower facial muscles are normal in strength and symmetric, there is no flattening of the nasolabial folds; tongue is midline; uvula is midline; shoulder elevation is normal.  MOTOR: Normal tone, bulk and strength - Except for right deltoid 4+, right triceps 4 minus, right hip flexion 4+ and the right dorsiflexion 4; Right upper extremity mild pronator drift. Mild drift right leg.  COORDINATION: Left finger to nose is normal, right finger to nose is normal, No rest tremor; no intention tremor; no postural tremor; no bradykinesia.  REFLEXES: Deep tendon reflexes are symmetrical and normal, But diminished in the legs. Babinski reflexes are flexor bilaterally.   SENSATION: Normal to light touch. There is no extinction to double simultaneous tactile stimulation. There appears to be some extinction on double simultaneous visual situation on the right.   NIH stroke scale  5  The brain MRI is reviewed in person. There is scattered increased signal seen on FLAIR imaging involving the pontine region, periventricular and deep white matter regions. These are quite numerous although not necessarily confluent.  There is increased signal seen on diffusion images involving the thalamic capsular region on the left side consistent with acute infarct. There is mild global atrophy.    Blood pressure 159/62, pulse 75, temperature 99.6 F (37.6 C), temperature source Oral, resp. rate 17, height '5\' 6"'  (1.676 m), weight 72.394 kg (159 lb 9.6 oz), SpO2 99 %.  Past Medical History  Diagnosis Date  . GERD (gastroesophageal reflux disease)   . Obesity   . DM (diabetes mellitus)   . HTN (hypertension)   . HLD (hyperlipidemia)   . CAD (coronary artery disease)   . UTI (urinary tract infection)   . Chest pain   . Dizziness     Past Surgical History  Procedure Laterality Date  . Cholecystectomy    . Colon polyps; hx    . Coronary angioplasty  02/14/09    Family History  Problem Relation Age of Onset  . Diabetes Mother   . Cancer Father   . Diabetes    . Heart attack    . Diabetes Sister     TWIN    Social History:  reports that she has never smoked. She does not have any smokeless tobacco history on file. She reports that she does not drink alcohol or use illicit drugs.  Allergies: No Known Allergies  Medications: Prior to Admission medications   Medication Sig Start Date End Date Taking? Authorizing Provider  amLODipine (NORVASC) 10 MG tablet Take 10 mg by mouth daily.   Yes Historical Provider, MD  aspirin 81 MG EC tablet Take 81 mg by mouth daily.     Yes Historical Provider, MD  furosemide (LASIX) 80 MG tablet Take 80 mg by mouth daily.   Yes Historical Provider, MD  HUMALOG KWIKPEN 100 UNIT/ML KiwkPen Inject 20 Units into the skin 3 (three) times daily after meals.  04/30/14  Yes Historical Provider, MD  labetalol (NORMODYNE) 300 MG tablet Take 300 mg by mouth 2 (two) times daily.     Yes Historical Provider, MD  LEVEMIR FLEXTOUCH 100 UNIT/ML Pen Inject 40 Units into the skin daily at 10 pm.  11/21/13  Yes Historical Provider, MD  omeprazole (PRILOSEC) 20 MG capsule Take 20 mg by mouth  every morning.    Yes Historical Provider, MD  potassium chloride (KLOR-CON) 10 MEQ CR tablet Take 10 mEq by mouth daily.     Yes Historical Provider, MD  pravastatin (PRAVACHOL) 80 MG tablet Take 1 tablet (80 mg total) by mouth daily. 04/28/11  Yes Renella Cunas, MD  valsartan-hydrochlorothiazide (DIOVAN-HCT) 320-25 MG per tablet Take 1 tablet by mouth daily.   Yes Historical Provider, MD    Scheduled Meds: .  stroke: mapping our early stages of recovery book   Does not apply Once  . amLODipine  10 mg Oral Daily  . aspirin  325 mg Oral Daily  . furosemide  80 mg Oral Daily  . heparin  5,000 Units Subcutaneous 3 times per day  . irbesartan  300 mg Oral Daily   And  . hydrochlorothiazide  25 mg Oral Daily  . insulin aspart  0-20 Units Subcutaneous TID WC  . insulin aspart  0-5 Units Subcutaneous QHS  . insulin detemir  40 Units Subcutaneous Q2200  . labetalol  300 mg Oral  BID  . pantoprazole  40 mg Oral Daily  . potassium chloride  10 mEq Oral Daily  . pravastatin  80 mg Oral q1800  . sodium chloride  3 mL Intravenous Q12H   Continuous Infusions: . sodium chloride 75 mL/hr at 07/04/14 2244   PRN Meds:.hydrALAZINE, ondansetron **OR** ondansetron (ZOFRAN) IV, promethazine     Results for orders placed or performed during the hospital encounter of 07/04/14 (from the past 48 hour(s))  CBG monitoring, ED     Status: None   Collection Time: 07/04/14  5:36 PM  Result Value Ref Range   Glucose-Capillary 79 70 - 99 mg/dL   Comment 1 Notify RN   Protime-INR     Status: None   Collection Time: 07/04/14  5:37 PM  Result Value Ref Range   Prothrombin Time 12.7 11.6 - 15.2 seconds   INR 0.94 0.00 - 1.49  APTT     Status: None   Collection Time: 07/04/14  5:37 PM  Result Value Ref Range   aPTT 32 24 - 37 seconds  CBC     Status: None   Collection Time: 07/04/14  5:37 PM  Result Value Ref Range   WBC 5.8 4.0 - 10.5 K/uL   RBC 4.66 3.87 - 5.11 MIL/uL   Hemoglobin 12.9 12.0 - 15.0 g/dL    HCT 38.0 36.0 - 46.0 %   MCV 81.5 78.0 - 100.0 fL   MCH 27.7 26.0 - 34.0 pg   MCHC 33.9 30.0 - 36.0 g/dL   RDW 12.6 11.5 - 15.5 %   Platelets 169 150 - 400 K/uL  Differential     Status: None   Collection Time: 07/04/14  5:37 PM  Result Value Ref Range   Neutrophils Relative % 59 43 - 77 %   Neutro Abs 3.4 1.7 - 7.7 K/uL   Lymphocytes Relative 33 12 - 46 %   Lymphs Abs 1.9 0.7 - 4.0 K/uL   Monocytes Relative 8 3 - 12 %   Monocytes Absolute 0.5 0.1 - 1.0 K/uL   Eosinophils Relative 0 0 - 5 %   Eosinophils Absolute 0.0 0.0 - 0.7 K/uL   Basophils Relative 0 0 - 1 %   Basophils Absolute 0.0 0.0 - 0.1 K/uL  Comprehensive metabolic panel     Status: Abnormal   Collection Time: 07/04/14  5:37 PM  Result Value Ref Range   Sodium 139 135 - 145 mmol/L   Potassium 3.2 (L) 3.5 - 5.1 mmol/L   Chloride 105 96 - 112 mmol/L   CO2 24 19 - 32 mmol/L   Glucose, Bld 77 70 - 99 mg/dL   BUN 21 6 - 23 mg/dL   Creatinine, Ser 1.08 0.50 - 1.10 mg/dL   Calcium 9.5 8.4 - 10.5 mg/dL   Total Protein 7.8 6.0 - 8.3 g/dL   Albumin 3.8 3.5 - 5.2 g/dL   AST 28 0 - 37 U/L   ALT 12 0 - 35 U/L   Alkaline Phosphatase 124 (H) 39 - 117 U/L   Total Bilirubin 0.6 0.3 - 1.2 mg/dL   GFR calc non Af Amer 48 (L) >90 mL/min   GFR calc Af Amer 55 (L) >90 mL/min    Comment: (NOTE) The eGFR has been calculated using the CKD EPI equation. This calculation has not been validated in all clinical situations. eGFR's persistently <90 mL/min signify possible Chronic Kidney Disease.    Anion gap 10 5 - 15  Troponin I  Status: None   Collection Time: 07/04/14  5:37 PM  Result Value Ref Range   Troponin I <0.03 <0.031 ng/mL    Comment:        NO INDICATION OF MYOCARDIAL INJURY.   TSH     Status: None   Collection Time: 07/04/14  5:37 PM  Result Value Ref Range   TSH 1.485 0.350 - 4.500 uIU/mL  Urinalysis, Routine w reflex microscopic     Status: Abnormal   Collection Time: 07/04/14  7:00 PM  Result Value Ref  Range   Color, Urine YELLOW YELLOW   APPearance CLOUDY (A) CLEAR   Specific Gravity, Urine 1.025 1.005 - 1.030   pH 6.0 5.0 - 8.0   Glucose, UA 500 (A) NEGATIVE mg/dL   Hgb urine dipstick MODERATE (A) NEGATIVE   Bilirubin Urine NEGATIVE NEGATIVE   Ketones, ur NEGATIVE NEGATIVE mg/dL   Protein, ur 100 (A) NEGATIVE mg/dL   Urobilinogen, UA 0.2 0.0 - 1.0 mg/dL   Nitrite NEGATIVE NEGATIVE   Leukocytes, UA MODERATE (A) NEGATIVE  Urine microscopic-add on     Status: Abnormal   Collection Time: 07/04/14  7:00 PM  Result Value Ref Range   Squamous Epithelial / LPF FEW (A) RARE   WBC, UA TOO NUMEROUS TO COUNT <3 WBC/hpf   RBC / HPF 0-2 <3 RBC/hpf   Bacteria, UA MANY (A) RARE  Troponin I     Status: None   Collection Time: 07/04/14  8:40 PM  Result Value Ref Range   Troponin I <0.03 <0.031 ng/mL    Comment:        NO INDICATION OF MYOCARDIAL INJURY.   Glucose, capillary     Status: Abnormal   Collection Time: 07/04/14  9:25 PM  Result Value Ref Range   Glucose-Capillary 149 (H) 70 - 99 mg/dL  Troponin I     Status: None   Collection Time: 07/05/14  1:56 AM  Result Value Ref Range   Troponin I <0.03 <0.031 ng/mL    Comment:        NO INDICATION OF MYOCARDIAL INJURY.   Comprehensive metabolic panel     Status: Abnormal   Collection Time: 07/05/14  1:56 AM  Result Value Ref Range   Sodium 138 135 - 145 mmol/L   Potassium 3.9 3.5 - 5.1 mmol/L    Comment: DELTA CHECK NOTED   Chloride 107 96 - 112 mmol/L   CO2 24 19 - 32 mmol/L   Glucose, Bld 262 (H) 70 - 99 mg/dL   BUN 21 6 - 23 mg/dL   Creatinine, Ser 1.13 (H) 0.50 - 1.10 mg/dL   Calcium 8.7 8.4 - 10.5 mg/dL   Total Protein 6.5 6.0 - 8.3 g/dL   Albumin 3.2 (L) 3.5 - 5.2 g/dL   AST 21 0 - 37 U/L   ALT 11 0 - 35 U/L   Alkaline Phosphatase 102 39 - 117 U/L   Total Bilirubin 0.7 0.3 - 1.2 mg/dL   GFR calc non Af Amer 45 (L) >90 mL/min   GFR calc Af Amer 53 (L) >90 mL/min    Comment: (NOTE) The eGFR has been calculated using  the CKD EPI equation. This calculation has not been validated in all clinical situations. eGFR's persistently <90 mL/min signify possible Chronic Kidney Disease.    Anion gap 7 5 - 15  CBC     Status: Abnormal   Collection Time: 07/05/14  2:00 AM  Result Value Ref Range   WBC 6.1  4.0 - 10.5 K/uL   RBC 4.06 3.87 - 5.11 MIL/uL   Hemoglobin 11.1 (L) 12.0 - 15.0 g/dL   HCT 32.5 (L) 36.0 - 46.0 %   MCV 80.0 78.0 - 100.0 fL   MCH 27.3 26.0 - 34.0 pg   MCHC 34.2 30.0 - 36.0 g/dL   RDW 12.3 11.5 - 15.5 %   Platelets 182 150 - 400 K/uL  Glucose, capillary     Status: Abnormal   Collection Time: 07/05/14  7:42 AM  Result Value Ref Range   Glucose-Capillary 244 (H) 70 - 99 mg/dL   Comment 1 Notify RN     Studies/Results:  BRAIN MRI 1. Acute lacunar infarct affecting the dorsal left thalamus and posterior limb of the left internal capsule. No mass effect or hemorrhage. 2. Other chronic small vessel disease is stable to mildly progressed since 2013.      Avante Carneiro A. Merlene [name], M.D.  Diplomate, Tax adviser of Psychiatry and Neurology ( Neurology). 07/05/2014, 9:30 AM

## 2014-07-05 NOTE — Progress Notes (Signed)
EEG Completed; Results Pending  

## 2014-07-05 NOTE — Progress Notes (Signed)
Inpatient Diabetes Program Recommendations  AACE/ADA: New Consensus Statement on Inpatient Glycemic Control (2013)  Target Ranges:  Prepandial:   less than 140 mg/dL      Peak postprandial:   less than 180 mg/dL (1-2 hours)      Critically ill patients:  140 - 180 mg/dL   Results for Glenda [name], Glenda [name] (MRN [id number]) as of 07/05/2014 13:33  Ref. Range 07/04/2014 17:36 07/04/2014 21:25 07/05/2014 07:42 07/05/2014 12:51  Glucose-Capillary Latest Range: 70-99 mg/dL 79 149 (H) 244 (H) 204 (H)    Diabetes history: DM2 Outpatient Diabetes medications: Levemir 40 units QHS, Humalog 20 units TID with meals Current orders for Inpatient glycemic control: Levemir 40 units QHS, Novolog 0-20 units TID with meals, Novolog 0-5 units HS  Inpatient Diabetes Program Recommendations Insulin - Basal: Please consider increasing Levemir to 42 units QHS. Insulin - Meal Coverage: If patient is eating at least 50% of meals, please consider ordering Novolog 6 units TID with meals for meal coverage.  Thanks, Barnie Alderman, RN, MSN, CCRN, CDE Diabetes Coordinator Inpatient Diabetes Program [phone] (Team Pager) [phone] (AP office) [phone] [facility] office)

## 2014-07-05 NOTE — Progress Notes (Signed)
Patient vomited a moderate amount after dinner and receiving PO medications.  Notified MD and will continue to monitor patient.

## 2014-07-05 NOTE — Care Management Utilization Note (Signed)
UR completed 

## 2014-07-06 DIAGNOSIS — I679 Cerebrovascular disease, unspecified: Secondary | ICD-10-CM

## 2014-07-06 DIAGNOSIS — I10 Essential (primary) hypertension: Secondary | ICD-10-CM

## 2014-07-06 DIAGNOSIS — N390 Urinary tract infection, site not specified: Secondary | ICD-10-CM

## 2014-07-06 LAB — GLUCOSE, CAPILLARY
GLUCOSE-CAPILLARY: 108 mg/dL — AB (ref 70–99)
GLUCOSE-CAPILLARY: 202 mg/dL — AB (ref 70–99)
GLUCOSE-CAPILLARY: 234 mg/dL — AB (ref 70–99)

## 2014-07-06 LAB — HEMOGLOBIN A1C
HEMOGLOBIN A1C: 13.1 % — AB (ref 4.8–5.6)
MEAN PLASMA GLUCOSE: 329 mg/dL

## 2014-07-06 MED ORDER — BISACODYL 5 MG PO TBEC
10.0000 mg | DELAYED_RELEASE_TABLET | Freq: Once | ORAL | Status: AC
  Administered 2014-07-06: 10 mg via ORAL
  Filled 2014-07-06: qty 2

## 2014-07-06 MED ORDER — POLYETHYLENE GLYCOL 3350 17 G PO PACK
17.0000 g | PACK | Freq: Every day | ORAL | Status: DC
  Administered 2014-07-06: 17 g via ORAL
  Filled 2014-07-06: qty 1

## 2014-07-06 MED ORDER — DEXTROSE 5 % IV SOLN
1.0000 g | INTRAVENOUS | Status: DC
  Filled 2014-07-06 (×2): qty 10

## 2014-07-06 MED ORDER — CLOPIDOGREL BISULFATE 75 MG PO TABS: 75.0000 mg | ORAL_TABLET | Freq: Every day | ORAL | Status: DC

## 2014-07-06 MED ORDER — CIPROFLOXACIN HCL 250 MG PO TABS: 250.0000 mg | ORAL_TABLET | Freq: Two times a day (BID) | ORAL | Status: DC

## 2014-07-06 MED ORDER — ASPIRIN 325 MG PO TABS: 325.0000 mg | ORAL_TABLET | Freq: Every day | ORAL | Status: DC

## 2014-07-06 NOTE — Progress Notes (Signed)
Patient agreed to have [place]. Staff called [facility] and spoke to Quillian Quince, MD orders and face to face encounter along with Demographic sheet faxed to [facility] with the understanding they would be getting in touch with the patient. Patient and family given [facility]'s telephone number.

## 2014-07-06 NOTE — Discharge Summary (Signed)
Physician Discharge Summary  Glenda [name] [id number] DOB: 1933-06-10 DOA: 07/04/2014  PCP: Donnie Coffin, MD  Admit date: 07/04/2014 Discharge date: 07/06/2014  Time spent: 45 minutes  Recommendations for Outpatient Follow-up:  1. Patient has been set up with home health services for diabetes education 2. Follow up with neurology in 4 weeks 3. Follow up with primary care doctor in 2 weeks  Discharge Diagnoses:  Principal Problem:   CVA (cerebral infarction) Active Problems:   Diabetes mellitus   Essential hypertension   UTI (urinary tract infection)   Slurred speech   Syncope   Hyperlipidemia   Discharge Condition: improved  Diet recommendation: low salt, low carb  Filed Weights   07/04/14 1651 07/04/14 1816 07/04/14 2126  Weight: 72.576 kg (160 lb) 69.4 kg (153 lb) 72.394 kg (159 lb 9.6 oz)    History of present illness:  This patient was admitted to the hospital with slurring of speech and a syncopal event. She did complain of some right sided weakness. She also had some dizziness on standing. By the time she arrived to the hospital, the majority of her symptoms were improving. She was not a candidate for tPA due to improvement in her symptoms.  Hospital Course:  Patient was found to have an acute left lacunar infarct on MRI. MRA head did not show significant findings. Carotid dopplers did not have any significant stenosis bilaterally. Echocardiogram was also unremarkable. Lipid panel indicated acceptable LDL on high dose statin. A1c was markedly elevated at 13. Patient had been continued out her outpatient insulin regimen in the hospital and blood sugars were under fair control, indicating an element of noncompliance (medication/dietary). She plans to follow up with an endocrinologist for further adjustment of meds.  She was seen in consultation by neurology who recommended to increase aspirin to 325mg  and start the patient on plavix. She will follow up with neurology  in 4 weeks.  She was found to have a possible UTI and received a dose of rocephin. She was transitioned over to ciprofloxacin to complete her course.  She was seen by physical therapy and did not need any physical therapy. Her nausea and vomiting have resolved. She still has some dizziness, but is able to ambulate without assistance. She will be discharged home today and follow up with her primary care doctor.  Procedures:  Echo:- Mild LVH with LVEF [id number], grade 1 diastolic dysfunction with increased filling pressures. Mild left atrial enlargement. Mild mitral regurgitation. Mildly elevated PASP 38 mmHg.  Consultations:  Neurology  Discharge Exam: Filed Vitals:   07/06/14 1306  BP: 156/53  Pulse: 70  Temp: 98.1 F (36.7 C)  Resp: 20    General: NAD Cardiovascular: S1, S2 RRR Respiratory: CTA B  Discharge Instructions   Discharge Instructions    Diet - low sodium heart healthy    Complete by:  As directed      Diet Carb Modified    Complete by:  As directed      Increase activity slowly    Complete by:  As directed           Current Discharge Medication List    START taking these medications   Details  aspirin 325 MG tablet Take 1 tablet (325 mg total) by mouth daily. Qty: 30 tablet, Refills: 1    ciprofloxacin (CIPRO) 250 MG tablet Take 1 tablet (250 mg total) by mouth 2 (two) times daily. Qty: 8 tablet, Refills: 0    clopidogrel (PLAVIX) 75 MG tablet Take  1 tablet (75 mg total) by mouth daily with breakfast. Qty: 30 tablet, Refills: 1      CONTINUE these medications which have NOT CHANGED   Details  amLODipine (NORVASC) 10 MG tablet Take 10 mg by mouth daily.    furosemide (LASIX) 80 MG tablet Take 80 mg by mouth daily.    HUMALOG KWIKPEN 100 UNIT/ML KiwkPen Inject 20 Units into the skin 3 (three) times daily after meals.  Refills: 3    labetalol (NORMODYNE) 300 MG tablet Take 300 mg by mouth 2 (two) times daily.      LEVEMIR FLEXTOUCH 100  UNIT/ML Pen Inject 40 Units into the skin daily at 10 pm.     omeprazole (PRILOSEC) 20 MG capsule Take 20 mg by mouth every morning.     potassium chloride (KLOR-CON) 10 MEQ CR tablet Take 10 mEq by mouth daily.      pravastatin (PRAVACHOL) 80 MG tablet Take 1 tablet (80 mg total) by mouth daily. Qty: 30 tablet, Refills: 5    valsartan-hydrochlorothiazide (DIOVAN-HCT) 320-25 MG per tablet Take 1 tablet by mouth daily.      STOP taking these medications     aspirin 81 MG EC tablet        No Known Allergies Follow-up Information    Follow up with [facility], KOFI, MD. Schedule an appointment as soon as possible for a visit in 4 weeks.   Specialty:  Neurology   Contact information:   [address] [place] [address] [phone]       Follow up with Donnie Coffin, MD. Schedule an appointment as soon as possible for a visit in 2 weeks.   Specialty:  Family Medicine   Contact information:   [address] [place] [address] [phone]       Follow up with KERR,JEFFREY, MD.   Specialty:  Endocrinology   Why:  as scheduled   Contact information:   [address] [place] [address] [phone]        The results of significant diagnostics from this hospitalization (including imaging, microbiology, ancillary and laboratory) are listed below for reference.    Significant Diagnostic Studies: Ct Head (brain) Wo Contrast  07/04/2014   CLINICAL DATA:  Found down at home. Incontinent with vomiting. Mental status change with right facial droop. History of hypertension and diabetes. Initial encounter.  EXAM: CT HEAD WITHOUT CONTRAST  TECHNIQUE: Contiguous axial images were obtained from the base of the skull through the vertex without intravenous contrast.  COMPARISON:  Head CT 04/26/2009.  MRI brain 09/21/2011.  FINDINGS: There is no evidence of acute intracranial hemorrhage, mass lesion, brain edema or extra-axial fluid collection. The  ventricles and subarachnoid spaces are appropriately sized for age. There is no CT evidence of acute cortical infarction. Minimal periventricular white matter disease appears unchanged. Low-density posteriorly in the right thalamus is unchanged. Intracranial vascular calcifications are noted.  The visualized paranasal sinuses, mastoid air cells and middle ears are clear. Postsurgical changes are present within the right mastoid air cells. The calvarium is intact.  IMPRESSION: No acute intracranial findings identified. Stable small vessel ischemic changes.   Electronically Signed   By: Richardean Sale M.D.   On: 07/04/2014 17:49   Mr Jodene Nam Head Wo Contrast  07/05/2014   CLINICAL DATA:  Left posterior limb internal capsule and lateral thalamus infarct. Abnormal MRI.  EXAM: MRA HEAD WITHOUT CONTRAST  TECHNIQUE: Angiographic images of the Circle of Willis were obtained  using MRA technique without intravenous contrast.  COMPARISON:  MRI brain 07/04/2014  FINDINGS: The internal carotid arteries are within normal limits from the high cervical segments through the ICA termini bilaterally. The A1 and M1 segments are normal. Anterior communicating artery is not visualized. The MCA bifurcations are intact.  There is mild attenuation of distal MCA branch vessels bilaterally without a significant proximal stenosis or occlusion.  The vertebral arteries are codominant. The right PICA origin is visualized and normal. The left AICA is dominant. The basilar artery is within normal limits. There is mild attenuation of P2 segments without a significant stenosis. Distal PCA branch vessel attenuation is evident bilaterally.  IMPRESSION: 1. Mild to moderate distal small vessel disease without a significant proximal stenosis, aneurysm, or branch vessel occlusion.   Electronically Signed   By: San Morelle M.D.   On: 07/05/2014 12:43   Mr Brain Wo Contrast  07/04/2014   CLINICAL DATA:  81 year old female found down at 15:45.  Right face droop. Initial encounter.  EXAM: MRI HEAD WITHOUT CONTRAST  TECHNIQUE: Multiplanar, multiecho pulse sequences of the brain and surrounding structures were obtained without intravenous contrast.  COMPARISON:  Head CT without contrast 1751 hours today. Brain MRI 09/21/2011.  FINDINGS: Stable cerebral volume since 2013. Major intracranial vascular flow voids are stable.  8 mm area of restricted diffusion in the lateral left thalamus near the posterior limb of the left internal capsule. Diffusion abnormality tracks toward the left cerebral peduncle. Associated T2 and FLAIR hyperintensity. No associated mass effect or hemorrhage.  No other restricted diffusion identified. No midline shift, mass effect, evidence of mass lesion, ventriculomegaly, extra-axial collection or acute intracranial hemorrhage. Cervicomedullary junction and pituitary are within normal limits. Chronic dorsal thalamic lacunar infarcts greater on the right. Scattered cerebral white matter T2 and FLAIR hyperintensity is not significantly changed. Moderate patchy signal abnormality in the pons is stable to mildly progressed.  Visible internal auditory structures appear normal. Chronic mild mastoid effusions. Mild paranasal sinus mucosal thickening. Stable orbits soft tissues. Visualized scalp soft tissues are within normal limits. Stable visualized cervical spine. Bone marrow signal within normal limits.  IMPRESSION: 1. Acute lacunar infarct affecting the dorsal left thalamus and posterior limb of the left internal capsule. No mass effect or hemorrhage. 2. Other chronic small vessel disease is stable to mildly progressed since 2013.   Electronically Signed   By: Genevie Ann M.D.   On: 07/04/2014 20:34   US Carotid Bilateral  07/05/2014   CLINICAL DATA:  History of hypertension, syncopal episode, CAD, hyperlipidemia and diabetes.  EXAM: BILATERAL CAROTID DUPLEX ULTRASOUND  TECHNIQUE: Pearline Cables scale imaging, color Doppler and duplex ultrasound were  performed of bilateral carotid and vertebral arteries in the neck.  COMPARISON:  Brain MRI / MRA - 07/05/2014  FINDINGS: Criteria: Quantification of carotid stenosis is based on velocity parameters that correlate the residual internal carotid diameter with NASCET-based stenosis levels, using the diameter of the distal internal carotid lumen as the denominator for stenosis measurement.  The following velocity measurements were obtained:  RIGHT  ICA:  75/16 cm/sec  CCA:  123456 cm/sec  SYSTOLIC ICA/CCA RATIO:  1.0  DIASTOLIC ICA/CCA RATIO:  1.5  ECA:  150 cm/sec  LEFT  ICA:  83/20 cm/sec  CCA:  [id number] cm/sec  SYSTOLIC ICA/CCA RATIO:  1.3  DIASTOLIC ICA/CCA RATIO:  1.9  ECA:  114 cm/sec  RIGHT CAROTID ARTERY: There is a minimal amount of eccentric mixed echogenic plaque within the right carotid bulb (image 19),  extending to involve the origin and proximal aspects of the right internal carotid artery (image 28), not resulting in elevated peak systolic velocities within the interrogated course of the right internal carotid artery to suggest a hemodynamically significant stenosis.  RIGHT VERTEBRAL ARTERY:  Antegrade flow  LEFT CAROTID ARTERY: There is a minimal amount of eccentric mixed echogenic plaque within the distal aspect the left common carotid artery (image 49). There is a minimal amount of eccentric mixed echogenic plaque within the left carotid bulb (image 52 and 54), extending to involve the origin and proximal aspects of the left internal carotid artery (image 64), not resulting in elevated peak systolic velocities within the interrogated course of the left internal carotid artery to suggest a hemodynamically significant stenosis.  LEFT VERTEBRAL ARTERY:  Antegrade thigh  Incidental note is made of a punctate (approximately 0.2 cm) anechoic cyst within the incidentally imaged left lobe of the thyroid (image 40).  IMPRESSION: Minimal amount of bilateral atherosclerotic plaque, left subjectively greater than  right, not resulting in a hemodynamically significant stenosis.   Electronically Signed   By: Sandi Mariscal M.D.   On: 07/05/2014 15:26    Microbiology: No results found for this or any previous visit (from the past 240 hour(s)).   Labs: Basic Metabolic Panel:  Recent Labs Lab 07/04/14 1737 07/05/14 0156  NA 139 138  K 3.2* 3.9  CL 105 107  CO2 24 24  GLUCOSE 77 262*  BUN 21 21  CREATININE 1.08 1.13*  CALCIUM 9.5 8.7   Liver Function Tests:  Recent Labs Lab 07/04/14 1737 07/05/14 0156  AST 28 21  ALT 12 11  ALKPHOS 124* 102  BILITOT 0.6 0.7  PROT 7.8 6.5  ALBUMIN 3.8 3.2*   No results for input(s): LIPASE, AMYLASE in the last 168 hours. No results for input(s): AMMONIA in the last 168 hours. CBC:  Recent Labs Lab 07/04/14 1737 07/05/14 0200  WBC 5.8 6.1  NEUTROABS 3.4  --   HGB 12.9 11.1*  HCT 38.0 32.5*  MCV 81.5 80.0  PLT 169 182   Cardiac Enzymes:  Recent Labs Lab 07/04/14 1737 07/04/14 2040 07/05/14 0156  TROPONINI <0.03 <0.03 <0.03   BNP: BNP (last 3 results) No results for input(s): BNP in the last 8760 hours.  ProBNP (last 3 results) No results for input(s): PROBNP in the last 8760 hours.  CBG:  Recent Labs Lab 07/05/14 1656 07/05/14 2104 07/06/14 0756 07/06/14 1142 07/06/14 1656  GLUCAP 227* 189* 108* 202* 234*       Signed:  Natahsa Marian  [facility] 07/06/2014, 6:05 PM

## 2014-07-06 NOTE — Evaluation (Signed)
Physical Therapy Evaluation Patient Details Name: Glenda [name] MRN: [id number] DOB: 1934/03/16 Today's Date: 07/06/2014   History of Present Illness  Glenda [name] is a 81 year old lady who apparently was found on the floor at approximately 3:30 PM today. She was seen last time in a normal state at 2:30 PM. When she was found, she was vomiting. Apparently her speech was slurred and there was a facial droop noted. In the emergency room, there was no facial droop appreciated and she appears to be back to her baseline with no further confusion. She denies any chest pain, dyspnea, palpitations or limb weakness. There has been some dizziness on standing. She is diabetic, hypertensive, has a history of hyperlipidemia and has coronary artery disease. She is now being admitted for further management.    Clinical Impression  [name], A 81 year old female, was admitted for possible CVA (Cerebral Infarction) secondary to recent fall. Patient /daughter Glenda [name] and Glenda [name]) narrated that patient was had blacked out and fell. Daughter Glenda [name] then noted patient was having slurred speech and facial drooping. Patient is referred for Physical Therapy for generalized weakness. Patient is seen today for PT evaluation and treatment. With Daughters in the room, Patient was seen in bed, awake, alert, oriented, pleasant, cooperative and able to follow directions well. Patient's PLOF (Prior Level of Function) is independent in all ADL and uses walker when going for a long distance walks, shopping, activities outside the house or community activities, other than that she is not using walker inside the house.  Patient reports difficulty swallowing pills. Per chart review and nursing report, Patient passed swallowing test.  Nursing staff was notified on patient's complaint. Nursing staff report that some pills given was large for the patient to swallow. Patient/family reported that they requested for a tub bench upon  discharge and had spoken to nursing staff about it.  ROM is WNL/WFL. Joint and Muscle functional excursion is WNL/WFL.  MMT break test performed in supine and sitting,  Rectus Femoris Muscles (Hip flexors with knee extended) on both sides are graded 3/5, other than that BLE is WNL/WFL grossly. Patient is impulsive in performing tasks with a tendency have fast movement in terms of bed mobility and functional transfers after which patient complaints of dizziness. Patient was then instructed to perform ankle pumps, heel and toe raises with DDBE (Deep Diagphramatic Breathing exercises), pacing, energy conservation techniques and frequent rest periods to prevent the onset of fatigue and to address dizziness. Patient reports symptom relieve after doing so. Noted: Weakness of Rectus Femoris muscle bilaterally, Good Sitting balance, Fair Standing balance, Impaired Safety awareness. Fall Risk. Activity is limited by Fatigue. Patient tolerated well all therapeutic exercises and activities with no complaints of dizziness, LOB or signs of fatigue. Patient/Family education: Written HEP [facility] Exercises Program) with teach back. PT recommendations: Continue HEP. Per patient/family preference, Outpatient PT vs. HHPT for LE strengthening and to address problem list especially safety awareness.        Follow Up Recommendations No PT follow up    Equipment Recommendations  none    Recommendations for Other Services OT consult     Precautions / Restrictions Precautions Precautions: Fall Restrictions Weight Bearing Restrictions: No      Mobility  Bed Mobility Overal bed mobility: Needs Assistance Bed Mobility: Supine to Sit;Sit to Supine    Supine to sit: Supervision;HOB elevated Sit to supine: Modified independent (Device/Increase time)   General bed mobility comments: Patient has a tendency to move fast and  with decreased safety awareness. Patient then complaints of dizziness when coming from supine to  sit.  Transfers Overall transfer level: Needs assistance Equipment used: Rolling walker (2 wheeled) Transfers: Sit to/from Stand (Multiple attempts) Sit to Stand:  (Multiple attempts)   Ambulation/Gait Ambulation/Gait assistance: Modified independent (Device/Increase time) Ambulation Distance (Feet): 100 Feet Assistive device: Rolling walker (2 wheeled) Gait Pattern/deviations: WFL(Within Functional Limits);Step-through pattern;Trunk flexed;Decreased stride length   Gait velocity interpretation: at or above normal speed for age/gender   Balance Overall balance assessment: Needs assistance Sitting-balance support: No upper extremity supported Sitting balance-Leahy Scale: Good  Standing balance support: Bilateral upper extremity supported;During functional activity Standing balance-Leahy Scale: Poor      Pertinent Vitals/Pain Pain Assessment: No/denies pain    Home Living Family/patient expects to be discharged to:: Private residence Living Arrangements: Children Glenda [name] (daughter)) Available Help at Discharge: Family Glenda [name] (daughter)) Type of Home: House Home Access: Stairs to enter Entrance Stairs-Rails: None Entrance Stairs-Number of Steps: 2 in the main entrance and 2 in the back Home Layout: One level;Able to live on main level with bedroom/bathroom Home Equipment: Walker - 4 wheels;Wheelchair - manual;Toilet riser;Grab bars - tub/shower      Prior Function Level of Independence: Independent       Hand Dominance   Dominant Hand: Right    Extremity/Trunk Assessment   Upper Extremity Assessment: Defer to OT evaluation  Lower Extremity Assessment: Generalized weakness   Cervical / Trunk Assessment: Kyphotic    Communication   Communication: No difficulties  Cognition Arousal/Alertness: Awake/alert Behavior During Therapy: WFL for tasks assessed/performed Overall Cognitive Status: Within Functional Limits for tasks assessed        Exercises  General Exercises - Lower Extremity Ankle Circles/Pumps: AROM;Both;10 reps;Supine Quad Sets: AROM;Both;10 reps;Supine Gluteal Sets: AROM;Both;10 reps;Supine Heel Slides: AROM;Both;20 reps;Supine Straight Leg Raises: AROM;Both;10 reps;Supine      Assessment/Plan    PT Assessment Patient needs continued PT services  PT Diagnosis Generalized weakness   PT Problem List Decreased strength;Decreased activity tolerance;Decreased balance;Decreased mobility;Decreased safety awareness  PT Treatment Interventions Gait training;Functional mobility training;Therapeutic activities;Therapeutic exercise;Balance training;Patient/family education   PT Goals (Current goals can be found in the Care Plan section) Acute Rehab PT Goals Patient Stated Goal: To return home PT Goal Formulation: With patient Time For Goal Achievement: 07/20/14 Potential to Achieve Goals: Good    Frequency Min 3X/week   Barriers to discharge none       End of Session Equipment Utilized During Treatment: Gait belt Activity Tolerance: Patient tolerated treatment well;Patient limited by fatigue Patient left: in chair;with call bell/phone within reach;with chair alarm set;with family/visitor present Nurse Communication: Mobility status         Time: 1007-1110 PT Time Calculation (min) (ACUTE ONLY): 63 min   Charges:   PT Evaluation $Initial PT Evaluation Tier I: 1 Procedure PT Treatments $Therapeutic Exercise: 8-22 mins $Therapeutic Activity: 8-22 mins (Bed mobility and functional general transfers with emphasis on safety awareness)    Suzane Vanderweide A 07/06/2014, 1:02 PM

## 2014-07-06 NOTE — Progress Notes (Signed)
AVS reviewed with patient. Verbalized understanding of discharge instructions, medications, and follow up appointments. Prescriptions given. Patient stable at time of discharge.

## 2014-07-07 LAB — HOMOCYSTEINE: Homocysteine: 11.5 umol/L (ref 0.0–15.0)

## 2014-07-08 NOTE — Care Management Note (Signed)
Called by Pt's daughter and told her mom needed [facility] PT. Orders verified and HH had been ordered at discharge over weekend. Pt requests [facility]. Romualdo Bolk, of [facility], notified of referral and will obtain pt information from chart. Pt's daughter notified [facility] has 48 hours to make first visit.

## 2014-07-09 NOTE — Procedures (Signed)
  [place] A. Merlene Laughter, MD     [email or web address]           HISTORY: The patient is 81 year old female who Presents with unresponsiveness and focal right facial twitching in drooping suspicious for seizure.  MEDICATIONS: Scheduled Meds: Continuous Infusions: PRN Meds:.  Prior to Admission medications   Medication Sig Start Date End Date Taking? Authorizing Provider  amLODipine (NORVASC) 10 MG tablet Take 10 mg by mouth daily.    Historical Provider, MD  aspirin 325 MG tablet Take 1 tablet (325 mg total) by mouth daily. 07/06/14   Kathie Dike, MD  ciprofloxacin (CIPRO) 250 MG tablet Take 1 tablet (250 mg total) by mouth 2 (two) times daily. 07/06/14   Kathie Dike, MD  clopidogrel (PLAVIX) 75 MG tablet Take 1 tablet (75 mg total) by mouth daily with breakfast. 07/06/14   Kathie Dike, MD  furosemide (LASIX) 80 MG tablet Take 80 mg by mouth daily.    Historical Provider, MD  HUMALOG KWIKPEN 100 UNIT/ML KiwkPen Inject 20 Units into the skin 3 (three) times daily after meals.  04/30/14   Historical Provider, MD  labetalol (NORMODYNE) 300 MG tablet Take 300 mg by mouth 2 (two) times daily.      Historical Provider, MD  LEVEMIR FLEXTOUCH 100 UNIT/ML Pen Inject 40 Units into the skin daily at 10 pm.  11/21/13   Historical Provider, MD  omeprazole (PRILOSEC) 20 MG capsule Take 20 mg by mouth every morning.     Historical Provider, MD  potassium chloride (KLOR-CON) 10 MEQ CR tablet Take 10 mEq by mouth daily.      Historical Provider, MD  pravastatin (PRAVACHOL) 80 MG tablet Take 1 tablet (80 mg total) by mouth daily. 04/28/11   Renella Cunas, MD  valsartan-hydrochlorothiazide (DIOVAN-HCT) 320-25 MG per tablet Take 1 tablet by mouth daily.    Historical Provider, MD      ANALYSIS: A 16 channel recording using standard 10 20 measurements is conducted for 21 minutes. There is a posterior background activity that is as high as 12 Hz. However, most of the recording stage II  non-REM sleep is seen with K complexes and sleep spindles. Photic stimulation and hyperventilation are not carried out. There are no focal or lateral slowing. There is no epileptiform activity observed.   IMPRESSION: 1. This is normal recording of the awake and sleep states.      Kamariah Fruchter A. Merlene Laughter, M.D.  Diplomate, Tax adviser of Psychiatry and Neurology ( Neurology).

## 2014-07-24 ENCOUNTER — Ambulatory Visit (INDEPENDENT_AMBULATORY_CARE_PROVIDER_SITE_OTHER): Payer: Medicare HMO | Admitting: Ophthalmology

## 2014-07-31 ENCOUNTER — Ambulatory Visit (INDEPENDENT_AMBULATORY_CARE_PROVIDER_SITE_OTHER): Payer: Medicare HMO | Admitting: Ophthalmology

## 2014-08-19 ENCOUNTER — Ambulatory Visit (INDEPENDENT_AMBULATORY_CARE_PROVIDER_SITE_OTHER): Payer: Medicare Other | Admitting: Ophthalmology

## 2014-08-19 DIAGNOSIS — E11351 Type 2 diabetes mellitus with proliferative diabetic retinopathy with macular edema: Secondary | ICD-10-CM

## 2014-08-19 DIAGNOSIS — H35033 Hypertensive retinopathy, bilateral: Secondary | ICD-10-CM

## 2014-08-19 DIAGNOSIS — H43813 Vitreous degeneration, bilateral: Secondary | ICD-10-CM | POA: Diagnosis not present

## 2014-08-19 DIAGNOSIS — E11359 Type 2 diabetes mellitus with proliferative diabetic retinopathy without macular edema: Secondary | ICD-10-CM | POA: Diagnosis not present

## 2014-08-19 DIAGNOSIS — E11311 Type 2 diabetes mellitus with unspecified diabetic retinopathy with macular edema: Secondary | ICD-10-CM

## 2014-08-19 DIAGNOSIS — I10 Essential (primary) hypertension: Secondary | ICD-10-CM

## 2014-10-21 ENCOUNTER — Other Ambulatory Visit: Payer: Self-pay

## 2014-12-08 ENCOUNTER — Emergency Department (HOSPITAL_COMMUNITY)
Admission: EM | Admit: 2014-12-08 | Discharge: 2014-12-08 | Disposition: A | Discharge status: Home / Self Care | Payer: Medicare Other | Attending: Emergency Medicine | Admitting: Emergency Medicine

## 2014-12-08 ENCOUNTER — Encounter (HOSPITAL_COMMUNITY): Payer: Self-pay | Admitting: Emergency Medicine

## 2014-12-08 ENCOUNTER — Emergency Department (HOSPITAL_COMMUNITY): Payer: Medicare Other

## 2014-12-08 DIAGNOSIS — Z7902 Long term (current) use of antithrombotics/antiplatelets: Secondary | ICD-10-CM | POA: Diagnosis not present

## 2014-12-08 DIAGNOSIS — Z7982 Long term (current) use of aspirin: Secondary | ICD-10-CM | POA: Diagnosis not present

## 2014-12-08 DIAGNOSIS — I2510 Atherosclerotic heart disease of native coronary artery without angina pectoris: Secondary | ICD-10-CM | POA: Insufficient documentation

## 2014-12-08 DIAGNOSIS — Z794 Long term (current) use of insulin: Secondary | ICD-10-CM | POA: Diagnosis not present

## 2014-12-08 DIAGNOSIS — E785 Hyperlipidemia, unspecified: Secondary | ICD-10-CM | POA: Insufficient documentation

## 2014-12-08 DIAGNOSIS — Z86010 Personal history of colonic polyps: Secondary | ICD-10-CM | POA: Diagnosis not present

## 2014-12-08 DIAGNOSIS — K219 Gastro-esophageal reflux disease without esophagitis: Secondary | ICD-10-CM | POA: Diagnosis not present

## 2014-12-08 DIAGNOSIS — Z9861 Coronary angioplasty status: Secondary | ICD-10-CM | POA: Insufficient documentation

## 2014-12-08 DIAGNOSIS — M7071 Other bursitis of hip, right hip: Secondary | ICD-10-CM | POA: Diagnosis not present

## 2014-12-08 DIAGNOSIS — Z79899 Other long term (current) drug therapy: Secondary | ICD-10-CM | POA: Diagnosis not present

## 2014-12-08 DIAGNOSIS — Y939 Activity, unspecified: Secondary | ICD-10-CM | POA: Diagnosis not present

## 2014-12-08 DIAGNOSIS — Z87440 Personal history of urinary (tract) infections: Secondary | ICD-10-CM | POA: Diagnosis not present

## 2014-12-08 DIAGNOSIS — E119 Type 2 diabetes mellitus without complications: Secondary | ICD-10-CM | POA: Insufficient documentation

## 2014-12-08 DIAGNOSIS — M25551 Pain in right hip: Secondary | ICD-10-CM

## 2014-12-08 DIAGNOSIS — E669 Obesity, unspecified: Secondary | ICD-10-CM | POA: Diagnosis not present

## 2014-12-08 DIAGNOSIS — I10 Essential (primary) hypertension: Secondary | ICD-10-CM | POA: Diagnosis not present

## 2014-12-08 LAB — CBG MONITORING, ED: GLUCOSE-CAPILLARY: 185 mg/dL — AB (ref 65–99)

## 2014-12-08 MED ORDER — HYDROCODONE-ACETAMINOPHEN 5-325 MG PO TABS
1.0000 | ORAL_TABLET | Freq: Once | ORAL | Status: AC
  Administered 2014-12-08: 1 via ORAL
  Filled 2014-12-08: qty 1

## 2014-12-08 MED ORDER — HYDROCODONE-ACETAMINOPHEN 5-325 MG PO TABS: 1.0000 | ORAL_TABLET | Freq: Four times a day (QID) | ORAL | Status: DC | PRN

## 2014-12-08 NOTE — ED Provider Notes (Signed)
CSN: [id number]     Arrival date & time 12/08/14  [id number] History   First MD Initiated Contact with Patient 12/08/14 [phone]     Chief Complaint  Patient presents with  . Hip Pain     (Consider location/radiation/quality/duration/timing/severity/associated sxs/prior Treatment) The history is provided by the patient.     Patient's daughter reports patient has been complaining of increased right hip pain for the past week.  She states the pt has noted a "knot" over the area.  Has also had nausea this week.  Pt has hx falls but not for several months.  Denies recent illness or other complaints.    Pt reports she woke up one week ago with pain in her right hip.  The pain is sharp and is only present with walking.  Has trouble bearing weight.  Has taken tylenol without improvement.  Had a stroke in March 2016 with no residual deficits but did fall on the right side at that time.  Denies weakness or numbness of the legs.  No hx bursitis.  Denies any new trauma or falls.  Denies fevers, recent illness.  Past Medical History  Diagnosis Date  . GERD (gastroesophageal reflux disease)   . Obesity   . DM (diabetes mellitus)   . HTN (hypertension)   . HLD (hyperlipidemia)   . CAD (coronary artery disease)   . UTI (urinary tract infection)   . Chest pain   . Dizziness    Past Surgical History  Procedure Laterality Date  . Cholecystectomy    . Colon polyps; hx    . Coronary angioplasty  02/14/09   Family History  Problem Relation Age of Onset  . Diabetes Mother   . Cancer Father   . Diabetes    . Heart attack    . Diabetes Sister     TWIN   Social History  Substance Use Topics  . Smoking status: Never Smoker   . Smokeless tobacco: None  . Alcohol Use: No   OB History    No data available     Review of Systems  All other systems reviewed and are negative.     Allergies  Review of patient's allergies indicates no known allergies.  Home Medications   Prior to Admission  medications   Medication Sig Start Date End Date Taking? Authorizing Provider  amLODipine (NORVASC) 10 MG tablet Take 10 mg by mouth daily.    Historical Provider, MD  aspirin 325 MG tablet Take 1 tablet (325 mg total) by mouth daily. 07/06/14   Kathie Dike, MD  ciprofloxacin (CIPRO) 250 MG tablet Take 1 tablet (250 mg total) by mouth 2 (two) times daily. 07/06/14   Kathie Dike, MD  clopidogrel (PLAVIX) 75 MG tablet Take 1 tablet (75 mg total) by mouth daily with breakfast. 07/06/14   Kathie Dike, MD  furosemide (LASIX) 80 MG tablet Take 80 mg by mouth daily.    Historical Provider, MD  HUMALOG KWIKPEN 100 UNIT/ML KiwkPen Inject 20 Units into the skin 3 (three) times daily after meals.  04/30/14   Historical Provider, MD  labetalol (NORMODYNE) 300 MG tablet Take 300 mg by mouth 2 (two) times daily.      Historical Provider, MD  LEVEMIR FLEXTOUCH 100 UNIT/ML Pen Inject 40 Units into the skin daily at 10 pm.  11/21/13   Historical Provider, MD  omeprazole (PRILOSEC) 20 MG capsule Take 20 mg by mouth every morning.     Historical Provider, MD  potassium chloride (KLOR-CON)  10 MEQ CR tablet Take 10 mEq by mouth daily.      Historical Provider, MD  pravastatin (PRAVACHOL) 80 MG tablet Take 1 tablet (80 mg total) by mouth daily. 04/28/11   Renella Cunas, MD  valsartan-hydrochlorothiazide (DIOVAN-HCT) 320-25 MG per tablet Take 1 tablet by mouth daily.    Historical Provider, MD   BP 176/55 mmHg  Pulse 66  Temp(Src) 98.2 F (36.8 C) (Oral)  Resp 16  SpO2 100% Physical Exam  Constitutional: She appears well-developed and well-nourished. No distress.  HENT:  Head: Normocephalic and atraumatic.  Neck: Neck supple.  Cardiovascular: Normal rate and regular rhythm.   Pulmonary/Chest: Effort normal and breath sounds normal. No respiratory distress. She has no wheezes. She has no rales.  Abdominal: Soft. She exhibits no distension. There is no tenderness. There is no rebound and no guarding.    Musculoskeletal:       Right hip: She exhibits tenderness and bony tenderness. She exhibits normal range of motion, normal strength, no swelling, no crepitus, no deformity and no laceration.       Legs: Lower extremities:  Strength 5/5, sensation intact, distal pulses intact.    Spine nontender, no crepitus, or stepoffs.  Right hip without erythema, edema, warmth.  No area of fluctuance, induration, crepitus.    Neurological: She is alert.  Skin: She is not diaphoretic.  Nursing note and vitals reviewed.   ED Course  Procedures (including critical care time) Labs Review Labs Reviewed - No data to display  Imaging Review Ct Hip Right Wo Contrast  12/08/2014   CLINICAL DATA:  Right hip pain. No known injury. Unable to bear weight.  EXAM: CT OF THE RIGHT HIP WITHOUT CONTRAST  TECHNIQUE: Multidetector CT imaging of the right hip was performed according to the standard protocol. Multiplanar CT image reconstructions were also generated.  COMPARISON:  Radiographs dated 12/29/2010  FINDINGS: The osseous structures of the right hip are normal. There is a slightly displaced fracture of the distal sacrum seen on image 92 of series 5. This is age indeterminate. There is no soft tissue swelling around that fracture small I suspect it may be old.  There is no right hip arthritis. Muscle structures are normal. Arterial vascular calcifications in the proximal thigh.  IMPRESSION: 1. Normal right hip. 2. Fracture of the distal sacrum, age indeterminate.   Electronically Signed   By: Lorriane Shire M.D.   On: 12/08/2014 14:14   Dg Hip Unilat  With Pelvis 2-3 Views Right  12/08/2014   CLINICAL DATA:  81 year old female with right hip pain for 1 week with no known injury. Initial encounter.  EXAM: DG HIP (WITH OR WITHOUT PELVIS) 2-3V RIGHT  COMPARISON:  Right hip series 9 07/2010.  FINDINGS: Extensive calcified atherosclerosis about the pelvis and continuing into both lower extremities. The femoral heads remain  normally located. Hip joint spaces appear stable and within normal limits. Pelvis intact. Degenerative changes at the pubic symphysis appear stable. SI joints stable and within normal limits. Lower lumbar spine degeneration. Proximal right femur intact. Proximal left femur also appears stable.  IMPRESSION: No acute osseous abnormality identified about the right hip or pelvis.   Electronically Signed   By: Genevie Ann M.D.   On: 12/08/2014 10:30      EKG Interpretation None       Patient's daughter notes that patient has occasional memory problems and she would like to be considered the medical POA.  She denies having any paperwork that  make her POA.    11:00 AM I discussed pt with Dr Rex Kras who will also see and evaluate the patient.   MDM   Final diagnoses:  Hip bursitis, right  Right hip pain    Afebrile, nontoxic patient with right hip pain x 1 week.  No trauma.  Clinically doubt septic joint.  Suspect bursitis.  Xray and CT of hip unremarkable.  Noted sacral fracture indeterminate timeframe, this does not appear to be the source of her pain.  Pt given norco in department and she was able to bear weight on the leg. D/C home with norco, orthopedic follow up.  Pt has walker at home and lives with daughter who can assist her.  Discussed result, findings, treatment, and follow up  with patient.  Pt given return precautions.  Pt verbalizes understanding and agrees with plan.         Clayton Bibles, PA-C 12/08/14 1640  Clayton Bibles, PA-C 12/08/14 [place], MD 12/09/14 [phone]

## 2014-12-08 NOTE — Discharge Instructions (Signed)
Read the information below.  Use the prescribed medication as directed.  Please discuss all new medications with your pharmacist.  Do not take additional tylenol while taking the prescribed pain medication to avoid overdose.  You may return to the Emergency Department at any time for worsening condition or any new symptoms that concern you.   If you develop uncontrolled pain, weakness or numbness of the extremity, severe discoloration of the skin, or you are unable to walk or move your hip, return to the ER for a recheck.      Hip Pain Your hip is the joint between your upper legs and your lower pelvis. The bones, cartilage, tendons, and muscles of your hip joint perform a lot of work each day supporting your body weight and allowing you to move around. Hip pain can range from a minor ache to severe pain in one or both of your hips. Pain may be felt on the inside of the hip joint near the groin, or the outside near the buttocks and upper thigh. You may have swelling or stiffness as well.  HOME CARE INSTRUCTIONS   Take medicines only as directed by your health care provider.  Apply ice to the injured area:  Put ice in a plastic bag.  Place a towel between your skin and the bag.  Leave the ice on for 15-20 minutes at a time, 3-4 times a day.  Keep your leg raised (elevated) when possible to lessen swelling.  Avoid activities that cause pain.  Follow specific exercises as directed by your health care provider.  Sleep with a pillow between your legs on your most comfortable side.  Record how often you have hip pain, the location of the pain, and what it feels like. SEEK MEDICAL CARE IF:   You are unable to put weight on your leg.  Your hip is red or swollen or very tender to touch.  Your pain or swelling continues or worsens after 1 week.  You have increasing difficulty walking.  You have a fever. SEEK IMMEDIATE MEDICAL CARE IF:   You have fallen.  You have a sudden increase in  pain and swelling in your hip. MAKE SURE YOU:   Understand these instructions.  Will watch your condition.  Will get help right away if you are not doing well or get worse. Document Released: 09/30/2009 Document Revised: 08/27/2013 Document Reviewed: 12/07/2012 [employer] Patient Information 2015 [place], [place]. This information is not intended to replace advice given to you by your health care provider. Make sure you discuss any questions you have with your health care provider.  Hip Bursitis Bursitis is a swelling and soreness (inflammation) of a fluid-filled sac (bursa). This sac overlies and protects the joints.  CAUSES   Injury.  Overuse of the muscles surrounding the joint.  Arthritis.  Gout.  Infection.  Cold weather.  Inadequate warm-up and conditioning prior to activities. The cause may not be known.  SYMPTOMS   Mild to severe irritation.  Tenderness and swelling over the outside of the hip.  Pain with motion of the hip.  If the bursa becomes infected, a fever may be present. Redness, tenderness, and warmth will develop over the hip. Symptoms usually lessen in 3 to 4 weeks with treatment, but can come back. TREATMENT If conservative treatment does not work, your caregiver may advise draining the bursa and injecting cortisone into the area. This may speed up the healing process. This may also be used as an initial treatment of  choice. HOME CARE INSTRUCTIONS   Apply ice to the affected area for 15-20 minutes every 3 to 4 hours while awake for the first 2 days. Put the ice in a plastic bag and place a towel between the bag of ice and your skin.  Rest the painful joint as much as possible, but continue to put the joint through a normal range of motion at least 4 times per day. When the pain lessens, begin normal, slow movements and usual activities to help prevent stiffness of the hip.  Only take over-the-counter or prescription medicines for pain, discomfort, or  fever as directed by your caregiver.  Use crutches to limit weight bearing on the hip joint, if advised.  Elevate your painful hip to reduce swelling. Use pillows for propping and cushioning your legs and hips.  Gentle massage may provide comfort and decrease swelling. SEEK IMMEDIATE MEDICAL CARE IF:   Your pain increases even during treatment, or you are not improving.  You have a fever.  You have heat and inflammation over the involved bursa.  You have any other questions or concerns. MAKE SURE YOU:   Understand these instructions.  Will watch your condition.  Will get help right away if you are not doing well or get worse. Document Released: 10/02/2001 Document Revised: 07/05/2011 Document Reviewed: 05/01/2008 [facility] Patient Information 2015 [place], [place]. This information is not intended to replace advice given to you by your health care provider. Make sure you discuss any questions you have with your health care provider.

## 2014-12-08 NOTE — ED Notes (Signed)
Pt reports R hip pain for the past week. No injury or fall. Gradually has not been not been able to bear weight on hip due to pain.

## 2014-12-18 ENCOUNTER — Ambulatory Visit (INDEPENDENT_AMBULATORY_CARE_PROVIDER_SITE_OTHER): Payer: Medicare Other | Admitting: Ophthalmology

## 2014-12-19 ENCOUNTER — Ambulatory Visit (INDEPENDENT_AMBULATORY_CARE_PROVIDER_SITE_OTHER): Payer: Medicare Other | Admitting: Ophthalmology

## 2014-12-23 ENCOUNTER — Ambulatory Visit (INDEPENDENT_AMBULATORY_CARE_PROVIDER_SITE_OTHER): Payer: Medicare Other | Admitting: Ophthalmology

## 2014-12-23 DIAGNOSIS — H43813 Vitreous degeneration, bilateral: Secondary | ICD-10-CM | POA: Diagnosis not present

## 2014-12-23 DIAGNOSIS — H35033 Hypertensive retinopathy, bilateral: Secondary | ICD-10-CM

## 2014-12-23 DIAGNOSIS — I10 Essential (primary) hypertension: Secondary | ICD-10-CM

## 2014-12-23 DIAGNOSIS — E11359 Type 2 diabetes mellitus with proliferative diabetic retinopathy without macular edema: Secondary | ICD-10-CM | POA: Diagnosis not present

## 2014-12-23 DIAGNOSIS — E11319 Type 2 diabetes mellitus with unspecified diabetic retinopathy without macular edema: Secondary | ICD-10-CM

## 2015-01-02 ENCOUNTER — Encounter: Payer: Self-pay | Admitting: Cardiology

## 2015-01-02 ENCOUNTER — Ambulatory Visit (INDEPENDENT_AMBULATORY_CARE_PROVIDER_SITE_OTHER): Payer: Medicare Other | Admitting: Cardiology

## 2015-01-02 VITALS — BP 124/64 | HR 61 | Ht 66.0 in | Wt 155.0 lb

## 2015-01-02 DIAGNOSIS — E785 Hyperlipidemia, unspecified: Secondary | ICD-10-CM

## 2015-01-02 DIAGNOSIS — I270 Primary pulmonary hypertension: Secondary | ICD-10-CM | POA: Diagnosis not present

## 2015-01-02 DIAGNOSIS — I10 Essential (primary) hypertension: Secondary | ICD-10-CM | POA: Diagnosis not present

## 2015-01-02 DIAGNOSIS — I2583 Coronary atherosclerosis due to lipid rich plaque: Secondary | ICD-10-CM

## 2015-01-02 DIAGNOSIS — I2510 Atherosclerotic heart disease of native coronary artery without angina pectoris: Secondary | ICD-10-CM | POA: Diagnosis not present

## 2015-01-02 DIAGNOSIS — I272 Pulmonary hypertension, unspecified: Secondary | ICD-10-CM

## 2015-01-02 NOTE — Patient Instructions (Signed)
Medication Instructions:   Your physician recommends that you continue on your current medications as directed. Please refer to the Current Medication list given to you today.     Labwork:  PRIOR TO YOUR ONE YEAR FOLLOW-UP APPOINTMENT WITH DR NELSON---CHECK CBC W DIFF, BMET, AND LIPIDS---COME FASTING TO THIS APPOINTMENT     Follow-Up:  ONE YEAR WITH DR NELSON--HAVE LABS DONE PRIOR TO THIS APPOINTMENT

## 2015-01-02 NOTE — Progress Notes (Signed)
Patient ID: Glenda [name], female   DOB: [birth year], 81 y.o.   MRN: [id number]     Patient Name: Glenda [name] Date of Encounter: 01/02/2015  Primary Care Provider:  Donnie Coffin, MD Primary Cardiologist: Dorothy Spark (Dr. Verl Blalock)  Problem List   Past Medical History  Diagnosis Date  . GERD (gastroesophageal reflux disease)   . Obesity   . DM (diabetes mellitus)   . HTN (hypertension)   . HLD (hyperlipidemia)   . CAD (coronary artery disease)   . UTI (urinary tract infection)   . Chest pain   . Dizziness    Past Surgical History  Procedure Laterality Date  . Cholecystectomy    . Colon polyps; hx    . Coronary angioplasty  02/14/09    Allergies  No Known Allergies  HPI  Glenda [name] returns today for evaluation and management of her coronary artery disease. She is having no typical angina, however she has been experiencing a lot of gas built up and belching, predominantly at night. She denies DOE, but experiences back pain while walking. On and off LE edema, currently normal, no orthopnea or PND, no syncope.  She also a history of mild mitral regurgitation by echocardiography. She is asymptomatic.  Lipids are labs are followed by her PCP.   01/02/2015 - 1 year follow up, she feels great, walks, no CP, no SOB, palpitations or syncope, also no orthopnea or PND. She had a stroke in March 2016 that she recovered from.  Home Medications  Prior to Admission medications   Medication Sig Start Date End Date Taking? Authorizing Provider  aspirin 81 MG EC tablet Take 81 mg by mouth daily.     Yes Historical Provider, MD  FREESTYLE LITE test strip  05/22/13  Yes Historical Provider, MD  furosemide (LASIX) 80 MG tablet Take 80 mg by mouth daily.   Yes Historical Provider, MD  Insulin Aspart (NOVOLOG FLEXPEN Atkins) Inject 40 Units into the skin as directed.   Yes Historical Provider, MD  labetalol (NORMODYNE) 300 MG tablet Take 300 mg by mouth 2 (two) times daily.     Yes  Historical Provider, MD  LEVEMIR FLEXTOUCH 100 UNIT/ML Pen 25 Units 2 (two) times daily. 11/21/13  Yes Historical Provider, MD  olmesartan-hydrochlorothiazide (BENICAR HCT) 40-25 MG per tablet Take 1 tablet by mouth daily.   Yes Historical Provider, MD  omeprazole (PRILOSEC) 20 MG capsule Take 20 mg by mouth daily.     Yes Historical Provider, MD  potassium chloride (KLOR-CON) 10 MEQ CR tablet Take 10 mEq by mouth daily.     Yes Historical Provider, MD  pravastatin (PRAVACHOL) 80 MG tablet Take 1 tablet (80 mg total) by mouth daily. 04/28/11  Yes Renella Cunas, MD  amLODipine (NORVASC) 10 MG tablet Take 10 mg by mouth daily.      Historical Provider, MD  losartan-hydrochlorothiazide (HYZAAR) 100-25 MG per tablet Take 1 tablet by mouth daily.      Historical Provider, MD  meclizine (ANTIVERT) 25 MG tablet  05/23/13   Historical Provider, MD  traMADol Veatrice Bourbon) 50 MG tablet  05/07/13   Historical Provider, MD    Family History  Family History  Problem Relation Age of Onset  . Diabetes Mother   . Cancer Father   . Diabetes    . Heart attack    . Diabetes Sister     TWIN    Social History  Social History   Social History  . Marital Status: Widowed  Spouse Name: N/A  . Number of Children: N/A  . Years of Education: N/A   Occupational History  . Not on file.   Social History Main Topics  . Smoking status: Never Smoker   . Smokeless tobacco: Not on file  . Alcohol Use: No  . Drug Use: No  . Sexual Activity: Not on file   Other Topics Concern  . Not on file   Social History Narrative   Retired.      Review of Systems, as per HPI, otherwise negative General:  No chills, fever, night sweats or weight changes.  Cardiovascular:  No chest pain, dyspnea on exertion, edema, orthopnea, palpitations, paroxysmal nocturnal dyspnea. Dermatological: No rash, lesions/masses Respiratory: No cough, dyspnea Urologic: No hematuria, dysuria Abdominal:   No nausea, vomiting, diarrhea, bright  red blood per rectum, melena, or hematemesis Neurologic:  No visual changes, wkns, changes in mental status. All other systems reviewed and are otherwise negative except as noted above.  Physical Exam  Blood pressure 124/64, pulse 61, height 5\' 6"  (1.676 m), weight 155 lb (70.308 kg), SpO2 99 %.  General: Pleasant, NAD Psych: Normal affect. Neuro: Alert and oriented X 3. Moves all extremities spontaneously. HEENT: Normal  Neck: Supple without bruits or JVD. Lungs:  Resp regular and unlabored, CTA. Heart: RRR no s3, s4, or murmurs. Abdomen: Soft, non-tender, non-distended, BS + x 4.  Extremities: No clubbing, cyanosis or edema. DP/PT/Radials 2+ and equal bilaterally.  Labs:  No results for input(s): CKTOTAL, CKMB, TROPONINI in the last 72 hours. Lab Results  Component Value Date   WBC 6.1 07/05/2014   HGB 11.1* 07/05/2014   HCT 32.5* 07/05/2014   MCV 80.0 07/05/2014   PLT 182 07/05/2014    No results found for: DDIMER Invalid input(s): POCBNP    Component Value Date/Time   NA 138 07/05/2014 0156   K 3.9 07/05/2014 0156   CL 107 07/05/2014 0156   CO2 24 07/05/2014 0156   GLUCOSE 262* 07/05/2014 0156   BUN 21 07/05/2014 0156   CREATININE 1.13* 07/05/2014 0156   CALCIUM 8.7 07/05/2014 0156   PROT 6.5 07/05/2014 0156   ALBUMIN 3.2* 07/05/2014 0156   AST 21 07/05/2014 0156   ALT 11 07/05/2014 0156   ALKPHOS 102 07/05/2014 0156   BILITOT 0.7 07/05/2014 0156   GFRNONAA 45* 07/05/2014 0156   GFRAA 53* 07/05/2014 0156   Accessory Clinical Findings  Echocardiogram - 03/07/2009   1. Left ventricle: The cavity size was normal. Wall thickness was normal. Systolic function was vigorous. The estimated ejection fraction was in the range of 65% to 70%. Doppler parameters are consistent with abnormal left ventricular relaxation (grade 1 diastolic dysfunction). 2. Mitral valve: Mild regurgitation. 3. Pulmonary arteries: PA peak pressure: 41mm Hg (S).  ECG - SR, non-specifi T  wave abnormalities, more pronounced inferior negative T waves when compared to 12/21/2012  Left cardiac cath 2012 ANGIOGRAPHIC DATA:  1. Ventriculography was performed in the RAO projection. Overall systolic  function was well preserved and no segmental abnormalities or contraction  were identified. There was near cavity obliteration. No significant  mitral regurgitation was noted.  2. There was no obvious calcification of the coronary vessels.  3. The left main coronary artery was fairly large and free of critical  disease.  4. The LAD coursed to the apex, wrapped the apex, and supplied a significant  portion of the distal inferior wall. There was a tiny first diagonal  that was free of critical disease. The  proximal and mid LAD appeared to  be without significant focal narrowing. The LAD after the major diagonal  which was the second diagonal demonstrates a segmental plaque of about 50-  60%. This was previously graded at 30, but appeared to be only mildly  progressed from the previous study. There is also a diagonal stenosis  that involves likely the bifurcation and is about 50-60% as well. The  diagonal is moderate in size.  5. The circumflex provides a tiny first marginal branch that has about a 50%  area of narrowing but it is very small. There is a second large marginal  branch which bifurcates twice in its distal course. It is a large  caliber vessel and without critical disease. The AV circumflex has a  segmental area of about 60-70% right after the takeoff of a large major  diagonal branch and that is graded a 60-70%. The distal termination of  this is a smaller posterolateral branch which thus supplies a fairly  small area of myocardium.  6. The right coronary artery is a small caliber vessel being 2 mm or less in  size. There is about a 30-40% area of plaquing in the first bend between  the proximal and mid vessel. There is probably 40% mid narrowing and in  the distal  vessel some mild luminal irregularity. The terminal branches  which consist of a posterior descending and several posterolateral  branches are relatively small in caliber.  CONCLUSIONS:  1. Hypodynamic left ventricular function.  2. Scattered disease of the left anterior descending and diagonal as noted  on previous study.  3. Moderate obstruction of the AV circumflex after the large marginal  takeoff.  4. Scattered irregularities of the right coronary artery as described above.  DISPOSITION: The initial plan would be to treat the patient medically. It  is unclear which, if any, of these lesions might be causing significant  angina. We plan to check a D-dimer. We also plan to give the patient  medical therapy. She will be followed by Jeanann Lewandowsky, M.D. and either  Junious Silk, M.D. [facility] or Loretha Brasil. [name], M.D.   Lexiscan stress test; 12/2013 Overall Impression:  Low risk stress nuclear study with a fixed defect in the mid and basal inferior wall due to attenuation artifact and increased gut uptake.  No ischemia noted.  LV Ejection Fraction: 53%.  LV Wall Motion:  NL LV Function; NL Wall Motion    Assessment & Plan  1. CAD -  Atypical ches pain a year ago- back pain and "gas built up" , known moderate CAD on cath in 2012,  a Lexiscan nuclear stress test in 9/15 was negative for ischemia, chest pain is now resolved.  2. HTN - controlled  3. HLP -  all at goal in March 2016  4. Mild pulmonary HTN - no significant symptoms  Follow up in 1 year   Dorothy Spark, MD, [facility] 01/02/2015, 9:47 AM

## 2015-03-13 ENCOUNTER — Other Ambulatory Visit: Payer: Self-pay

## 2015-03-13 DIAGNOSIS — Z1231 Encounter for screening mammogram for malignant neoplasm of breast: Secondary | ICD-10-CM

## 2015-04-01 ENCOUNTER — Ambulatory Visit
Admission: RE | Admit: 2015-04-01 | Discharge: 2015-04-01 | Disposition: A | Discharge status: Home / Self Care | Payer: Medicare Other | Source: Ambulatory Visit

## 2015-04-01 DIAGNOSIS — Z1231 Encounter for screening mammogram for malignant neoplasm of breast: Secondary | ICD-10-CM

## 2015-04-02 ENCOUNTER — Other Ambulatory Visit: Payer: Self-pay | Admitting: Family Medicine

## 2015-04-02 DIAGNOSIS — R928 Other abnormal and inconclusive findings on diagnostic imaging of breast: Secondary | ICD-10-CM

## 2015-04-10 ENCOUNTER — Ambulatory Visit
Admission: RE | Admit: 2015-04-10 | Discharge: 2015-04-10 | Disposition: A | Discharge status: Home / Self Care | Payer: Medicare Other | Source: Ambulatory Visit | Attending: Family Medicine | Admitting: Family Medicine

## 2015-04-10 ENCOUNTER — Other Ambulatory Visit: Payer: Self-pay | Admitting: Family Medicine

## 2015-04-10 DIAGNOSIS — N6310 Unspecified lump in the right breast, unspecified quadrant: Secondary | ICD-10-CM

## 2015-04-10 DIAGNOSIS — R928 Other abnormal and inconclusive findings on diagnostic imaging of breast: Secondary | ICD-10-CM

## 2015-04-24 ENCOUNTER — Other Ambulatory Visit (HOSPITAL_COMMUNITY): Payer: Self-pay | Admitting: Radiology

## 2015-04-24 ENCOUNTER — Other Ambulatory Visit: Payer: Medicare Other

## 2015-04-24 ENCOUNTER — Ambulatory Visit
Admission: RE | Admit: 2015-04-24 | Discharge: 2015-04-24 | Disposition: A | Discharge status: Home / Self Care | Payer: Medicare Other | Source: Ambulatory Visit | Attending: Family Medicine | Admitting: Family Medicine

## 2015-04-24 ENCOUNTER — Other Ambulatory Visit: Payer: Self-pay | Admitting: Family Medicine

## 2015-04-24 DIAGNOSIS — N6310 Unspecified lump in the right breast, unspecified quadrant: Secondary | ICD-10-CM

## 2015-04-29 ENCOUNTER — Other Ambulatory Visit: Payer: Medicare Other

## 2015-04-30 ENCOUNTER — Telehealth: Payer: Self-pay | Admitting: *Deleted

## 2015-04-30 DIAGNOSIS — C50411 Malignant neoplasm of upper-outer quadrant of right female breast: Secondary | ICD-10-CM | POA: Insufficient documentation

## 2015-04-30 NOTE — Telephone Encounter (Signed)
Confirmed [facility] for 05/07/15 at 1230pm with daughter .  Instructions and contact information given.

## 2015-05-07 ENCOUNTER — Ambulatory Visit: Payer: Self-pay | Admitting: Surgery

## 2015-05-07 ENCOUNTER — Encounter: Payer: Self-pay | Admitting: Hematology and Oncology

## 2015-05-07 ENCOUNTER — Encounter: Payer: Self-pay | Admitting: Nurse Practitioner

## 2015-05-07 ENCOUNTER — Encounter: Payer: Self-pay | Admitting: Physical Therapy

## 2015-05-07 ENCOUNTER — Other Ambulatory Visit (HOSPITAL_BASED_OUTPATIENT_CLINIC_OR_DEPARTMENT_OTHER): Payer: Medicare Other

## 2015-05-07 ENCOUNTER — Ambulatory Visit
Admission: RE | Admit: 2015-05-07 | Discharge: 2015-05-07 | Disposition: A | Discharge status: Home / Self Care | Payer: Medicare Other | Source: Ambulatory Visit | Attending: Radiation Oncology | Admitting: Radiation Oncology

## 2015-05-07 ENCOUNTER — Ambulatory Visit (HOSPITAL_BASED_OUTPATIENT_CLINIC_OR_DEPARTMENT_OTHER): Payer: Medicare Other | Admitting: Hematology and Oncology

## 2015-05-07 ENCOUNTER — Ambulatory Visit: Payer: Medicare Other | Attending: Surgery | Admitting: Physical Therapy

## 2015-05-07 VITALS — BP 223/61 | HR 60 | Temp 98.0°F | Resp 18 | Ht 66.0 in | Wt 158.8 lb

## 2015-05-07 DIAGNOSIS — M25511 Pain in right shoulder: Secondary | ICD-10-CM | POA: Diagnosis not present

## 2015-05-07 DIAGNOSIS — M25611 Stiffness of right shoulder, not elsewhere classified: Secondary | ICD-10-CM | POA: Diagnosis present

## 2015-05-07 DIAGNOSIS — I10 Essential (primary) hypertension: Secondary | ICD-10-CM | POA: Diagnosis not present

## 2015-05-07 DIAGNOSIS — D0581 Other specified type of carcinoma in situ of right breast: Secondary | ICD-10-CM

## 2015-05-07 DIAGNOSIS — C50411 Malignant neoplasm of upper-outer quadrant of right female breast: Secondary | ICD-10-CM

## 2015-05-07 DIAGNOSIS — C50911 Malignant neoplasm of unspecified site of right female breast: Secondary | ICD-10-CM | POA: Diagnosis present

## 2015-05-07 DIAGNOSIS — D0511 Intraductal carcinoma in situ of right breast: Secondary | ICD-10-CM

## 2015-05-07 LAB — COMPREHENSIVE METABOLIC PANEL
ALBUMIN: 3.3 g/dL — AB (ref 3.5–5.0)
ALK PHOS: 95 U/L (ref 40–150)
ALT: <9 U/L (ref 0–55)
AST: 15 U/L (ref 5–34)
Anion Gap: 8 mEq/L (ref 3–11)
BUN: 22.5 mg/dL (ref 7.0–26.0)
CALCIUM: 9.1 mg/dL (ref 8.4–10.4)
CO2: 22 mEq/L (ref 22–29)
Chloride: 111 mEq/L — ABNORMAL HIGH (ref 98–109)
Creatinine: 1.2 mg/dL — ABNORMAL HIGH (ref 0.6–1.1)
EGFR: 48 mL/min/{1.73_m2} — AB (ref >90)
Glucose: 110 mg/dl (ref 70–140)
POTASSIUM: 4.3 meq/L (ref 3.5–5.1)
Sodium: 141 mEq/L (ref 136–145)
Total Bilirubin: 0.3 mg/dL (ref 0.20–1.20)
Total Protein: 7.1 g/dL (ref 6.4–8.3)

## 2015-05-07 LAB — CBC WITH DIFFERENTIAL/PLATELET
BASO%: 0.4 % (ref 0.0–2.0)
BASOS ABS: 0 10*3/uL (ref 0.0–0.1)
EOS ABS: 0 10*3/uL (ref 0.0–0.5)
EOS%: 0.8 % (ref 0.0–7.0)
HEMATOCRIT: 34 % — AB (ref 34.8–46.6)
HEMOGLOBIN: 11 g/dL — AB (ref 11.6–15.9)
LYMPH#: 1.7 10*3/uL (ref 0.9–3.3)
LYMPH%: 33 % (ref 14.0–49.7)
MCH: 27.2 pg (ref 25.1–34.0)
MCHC: 32.4 g/dL (ref 31.5–36.0)
MCV: 84 fL (ref 79.5–101.0)
MONO#: 0.5 10*3/uL (ref 0.1–0.9)
MONO%: 9.8 % (ref 0.0–14.0)
NEUT#: 3 10*3/uL (ref 1.5–6.5)
NEUT%: 56 % (ref 38.4–76.8)
PLATELETS: 207 10*3/uL (ref 145–400)
RBC: 4.05 10*6/uL (ref 3.70–5.45)
RDW: 12.8 % (ref 11.2–14.5)
WBC: 5.3 10*3/uL (ref 3.9–10.3)

## 2015-05-07 NOTE — Progress Notes (Signed)
[place] CONSULT NOTE  Patient Care Team: L.Donnie Coffin, MD as PCP - General (Family Medicine) Erroll Luna, MD as Consulting Physician (General Surgery) Nicholas Lose, MD as Consulting Physician (Hematology and Oncology) Gery Pray, MD as Consulting Physician (Radiation Oncology)  CHIEF COMPLAINTS/PURPOSE OF CONSULTATION:  Newly diagnosed DCIS  HISTORY OF PRESENTING ILLNESS:  Glenda [name] 82 y.o. female is here because of recent diagnosis of right breast DCIS. Patient presented to a routine screening mammogram that revealed a right breast asymmetry of 9:30 position. By ultrasound it revealed a 1.4 cm area of asymmetry. She underwent a biopsy of this under ultrasound which came back as DCIS high-grade with necrosis and calcification. ER/PR was 0%.  I reviewed her records extensively and collaborated the history with the patient.  SUMMARY OF ONCOLOGIC HISTORY:   Breast cancer of upper-outer quadrant of right female breast ([place])   04/14/2015 Mammogram Right breast asymmetry at 9:30 position 1.3 x 0.8 x 1.4 cm   04/24/2015 Initial Diagnosis Right breast biopsy 9:30 position: DCIS high-grade with necrosis and calcifications,? Early invasive disease, ER 0% PR 0%    MEDICAL HISTORY:  Past Medical History  Diagnosis Date  . GERD (gastroesophageal reflux disease)   . Obesity   . DM (diabetes mellitus) ([place])   . HTN (hypertension)   . HLD (hyperlipidemia)   . CAD (coronary artery disease)   . UTI (urinary tract infection)   . Chest pain   . Dizziness   . Breast cancer ([place])   . Arthritis     SURGICAL HISTORY: Past Surgical History  Procedure Laterality Date  . Cholecystectomy    . Colon polyps; hx    . Coronary angioplasty  02/14/09    SOCIAL HISTORY: Social History   Social History  . Marital Status: Widowed    Spouse Name: N/A  . Number of Children: N/A  . Years of Education: N/A   Occupational History  . Not on file.   Social History Main  Topics  . Smoking status: Never Smoker   . Smokeless tobacco: Not on file  . Alcohol Use: No  . Drug Use: No  . Sexual Activity: Not on file   Other Topics Concern  . Not on file   Social History Narrative   Retired.     FAMILY HISTORY: Family History  Problem Relation Age of Onset  . Diabetes Mother   . Cancer Father   . Diabetes    . Heart attack    . Diabetes Sister     TWIN    ALLERGIES:  has No Known Allergies.  MEDICATIONS:  Current Outpatient Prescriptions  Medication Sig Dispense Refill  . acetaminophen (TYLENOL) 500 MG tablet Take 1,000 mg by mouth every 6 (six) hours as needed for moderate pain.    Marland Kitchen amLODipine (NORVASC) 10 MG tablet Take 10 mg by mouth daily.    Marland Kitchen aspirin 81 MG tablet Take 81 mg by mouth daily.    Marland Kitchen BYSTOLIC 20 MG TABS TK 1 T PO  D  6  . cetirizine (ZYRTEC) 10 MG tablet Take 10 mg by mouth as needed for allergies.    Marland Kitchen clopidogrel (PLAVIX) 75 MG tablet Take 1 tablet (75 mg total) by mouth daily with breakfast. 30 tablet 1  . furosemide (LASIX) 80 MG tablet Take 80 mg by mouth daily.    Marland Kitchen HUMALOG KWIKPEN 100 UNIT/ML KiwkPen Inject 40 Units into the skin 3 (three) times daily after meals.   3  .  omeprazole (PRILOSEC) 20 MG capsule Take 20 mg by mouth every morning.     . potassium chloride (KLOR-CON) 10 MEQ CR tablet Take 10 mEq by mouth daily.      Nelva Nay SOLOSTAR 300 UNIT/ML SOPN Inject 20 Units into the skin every morning.  11  . valsartan-hydrochlorothiazide (DIOVAN-HCT) 320-25 MG per tablet Take 1 tablet by mouth daily.     No current facility-administered medications for this visit.    REVIEW OF SYSTEMS:   Constitutional: Denies fevers, chills or abnormal night sweats Eyes: Denies blurriness of vision, double vision or watery eyes Ears, nose, mouth, throat, and face: Denies mucositis or sore throat Respiratory: Denies cough, dyspnea or wheezes Cardiovascular: Denies palpitation, chest discomfort or lower extremity  swelling Gastrointestinal:  Denies nausea, heartburn or change in bowel habits Skin: Denies abnormal skin rashes Lymphatics: Denies new lymphadenopathy or easy bruising Neurological:Denies numbness, tingling or new weaknesses Behavioral/Psych: Mood is stable, no new changes  Breast:  Denies any palpable lumps or discharge All other systems were reviewed with the patient and are negative.  PHYSICAL EXAMINATION: ECOG PERFORMANCE STATUS: 0 - Asymptomatic  Filed Vitals:   05/07/15 1317  BP: 223/61  Pulse: 60  Temp: 98 F (36.7 C)  Resp: 18   Filed Weights   05/07/15 1317  Weight: 158 lb 12.8 oz (72.031 kg)    GENERAL:alert, no distress and comfortable SKIN: skin color, texture, turgor are normal, no rashes or significant lesions EYES: normal, conjunctiva are pink and non-injected, sclera clear OROPHARYNX:no exudate, no erythema and lips, buccal mucosa, and tongue normal  NECK: supple, thyroid normal size, non-tender, without nodularity LYMPH:  no palpable lymphadenopathy in the cervical, axillary or inguinal LUNGS: clear to auscultation and percussion with normal breathing effort HEART: regular rate & rhythm and no murmurs and no lower extremity edema ABDOMEN:abdomen soft, non-tender and normal bowel sounds Musculoskeletal:no cyanosis of digits and no clubbing  PSYCH: alert & oriented x 3 with fluent speech NEURO: no focal motor/sensory deficits BREAST: No palpable nodules in breast. No palpable axillary or supraclavicular lymphadenopathy (exam performed in the presence of a chaperone)   LABORATORY DATA:  I have reviewed the data as listed Lab Results  Component Value Date   WBC 5.3 05/07/2015   HGB 11.0* 05/07/2015   HCT 34.0* 05/07/2015   MCV 84.0 05/07/2015   PLT 207 05/07/2015   Lab Results  Component Value Date   NA 141 05/07/2015   K 4.3 05/07/2015   CL 107 07/05/2014   CO2 22 05/07/2015    ASSESSMENT AND PLAN:  Breast cancer of upper-outer quadrant of  right female breast (HCC) Right breast biopsy 9:30 position: DCIS high-grade with necrosis and calcifications,? Early invasive disease, ER 0% PR 0%; 1.3 x 0.8 x 1.4 cm right breast asymmetry.  Pathology review: I discussed with the patient the difference between DCIS and invasive breast cancer. It is considered a precancerous lesion. DCIS is classified as a 0. It is generally detected through mammograms as calcifications. We discussed the significance of grades and its impact on prognosis. We also discussed the importance of ER and PR receptors and their implications to adjuvant treatment options. Prognosis of DCIS dependence on grade, comedo necrosis. It is anticipated that if not treated, 20-30% of DCIS can develop into invasive breast cancer.  Recommendation: 1. Breast conserving surgery 2. Followed by adjuvant radiation therapy  Return to clinic after surgery to discuss the final pathology report and come up with an adjuvant treatment plan.  All questions were answered. The patient knows to call the clinic with any problems, questions or concerns.    Rulon Eisenmenger, MD 05/07/2015

## 2015-05-07 NOTE — Therapy (Signed)
[facility] [place], [place], [address] Phone: [phone]   Fax:  [phone]  Physical Therapy Evaluation  Patient Details  Name: Glenda [name] MRN: [id number] Date of Birth: [birth year] Referring Provider: Dr. Erroll Luna  Encounter Date: 05/07/2015      PT End of Session - 05/07/15 1618    Visit Number 1   Number of Visits 1   PT Start Time [id number]   PT Stop Time [id number]  Also saw pt from [id number] and [id number] for a total of 42 minutes   PT Time Calculation (min) 8 min   Activity Tolerance Patient tolerated treatment well   Behavior During Therapy [facility] for tasks assessed/performed      Past Medical History  Diagnosis Date  . GERD (gastroesophageal reflux disease)   . Obesity   . DM (diabetes mellitus) ([place])   . HTN (hypertension)   . HLD (hyperlipidemia)   . CAD (coronary artery disease)   . UTI (urinary tract infection)   . Chest pain   . Dizziness   . Breast cancer ([place])   . Arthritis     Past Surgical History  Procedure Laterality Date  . Cholecystectomy    . Colon polyps; hx    . Coronary angioplasty  02/14/09    There were no vitals filed for this visit.  Visit Diagnosis:  Right shoulder pain - Plan: PT plan of care cert/re-cert  Shoulder stiffness, right - Plan: PT plan of care cert/re-cert  Breast cancer, female, right - Plan: PT plan of care cert/re-cert      Subjective Assessment - 05/07/15 1619    Subjective Patient was seen today for a baseline assessment of her newly diagnosed right breast cancer   Pertinent History Patient was diagnosed 04/15/15 with right high grade DCIS with calcs.  It is ER/PR negative and measures 1.4 cm.   Patient Stated Goals Reduce lymphedema risk and learn post op shoulder ROM HEP            [facility] PT Assessment - 05/07/15 0001    Assessment   Medical Diagnosis Right breast cancer   Referring Provider Dr. Marcello Moores [place]   Onset  Date/Surgical Date 04/15/15   Hand Dominance Right   Prior Therapy Had home health PT 3/16 after CVA   Precautions   Precautions Other (comment)   Precaution Comments Active breast cancer; limited use of right arm due to previous CVA   Restrictions   Weight Bearing Restrictions No   Balance Screen   Has the patient fallen in the past 6 months No   Has the patient had a decrease in activity level because of a fear of falling?  No   Is the patient reluctant to leave their home because of a fear of falling?  No   Home Ecologist residence   Living Arrangements Children  Daughter   Available Help at Discharge Family   Prior Function   Level of [place] Retired   Leisure She does not exercise   Cognition   Overall Cognitive Status Within Functional Limits for tasks assessed   Posture/Postural Control   Posture/Postural Control Postural limitations   Postural Limitations Increased thoracic kyphosis;Forward head;Rounded Shoulders   ROM / Strength   AROM / PROM / Strength AROM;Strength   AROM   AROM Assessment Site Shoulder   Right/Left Shoulder Right;Left   Right Shoulder Extension 35 Degrees   Right Shoulder Flexion 98 Degrees  Limited by pain and weakness   Right Shoulder ABduction 86 Degrees  Limited by pain and weakness   Right Shoulder Internal Rotation --  Not tested due to pain and limited abduction   Right Shoulder External Rotation --  Not tested due to pain and limited abduction   Left Shoulder Extension 60 Degrees   Left Shoulder Flexion 125 Degrees   Left Shoulder ABduction 118 Degrees   Left Shoulder Internal Rotation 61 Degrees   Left Shoulder External Rotation 70 Degrees   Strength   Overall Strength Unable to assess;Due to pain   Overall Strength Comments Left shoulder WFL           LYMPHEDEMA/ONCOLOGY QUESTIONNAIRE - 05/07/15 1615    Type   Cancer Type Right breast cancer   Lymphedema  Assessments   Lymphedema Assessments Upper extremities   Right Upper Extremity Lymphedema   10 cm Proximal to Olecranon Process 26.7 cm   Olecranon Process 25.5 cm   10 cm Proximal to Ulnar Styloid Process 21 cm   Just Proximal to Ulnar Styloid Process 15.9 cm   Across Hand at [employer] 18.9 cm   At [place] of 2nd Digit 6.4 cm   Left Upper Extremity Lymphedema   10 cm Proximal to Olecranon Process 25.8 cm   Olecranon Process 25.1 cm   10 cm Proximal to Ulnar Styloid Process 20.4 cm   Just Proximal to Ulnar Styloid Process 15.4 cm   Across Hand at [employer] 18.6 cm   At [place] of 2nd Digit 6 cm      Patient was instructed today in a home exercise program today for post op shoulder range of motion. These included active assist shoulder flexion in sitting, scapular retraction, wall walking with shoulder abduction, and hands behind head external rotation.  She was encouraged to do these twice a day, holding 3 seconds and repeating 5 times when permitted by her physician.         PT Education - 05/07/15 1617    Education provided Yes   Education Details Lymphedema risk reduction and post op shoulder ROM HEP   Person(s) Educated Patient   Methods Explanation;Demonstration;Handout   Comprehension Returned demonstration;Verbalized understanding              Breast Clinic Goals - 05/07/15 1624    Patient will be able to verbalize understanding of pertinent lymphedema risk reduction practices relevant to her diagnosis specifically related to skin care.   Time 1   Period Days   Status Achieved   Patient will be able to return demonstrate and/or verbalize understanding of the post-op home exercise program related to regaining shoulder range of motion.   Time 1   Period Days   Status Achieved   Patient will be able to verbalize understanding of the importance of attending the postoperative After Breast Cancer Class for further lymphedema risk reduction education and  therapeutic exercise.   Time 1   Period Days   Status Achieved              Plan - 05/07/15 1619    Clinical Impression Statement Patient was diagnosed 04/15/15 with right high grade DCIS with calcs.  It is ER/PR negative and measures 1.4 cm.  She is planning to have a right lumpectomy and sentinel node biopsy followed by radiation.  She would benefit from PT after surgery due to her right shoulder ROM limitations, weakness and pain.  Although her right UE is limited by  her previous CVA, I anticipate her shoulder will be worse after surgery due to positioning.  She may require improved ROM to obtain radiation positioning.   Pt will benefit from skilled therapeutic intervention in order to improve on the following deficits Decreased strength;Decreased knowledge of precautions;Pain;Impaired UE functional use;Decreased range of motion   Rehab Potential Good   Clinical Impairments Affecting Rehab Potential CVA 3/16 with right arm deficits   PT Frequency One time visit   PT Treatment/Interventions Therapeutic exercise;Patient/family education   PT Next Visit Plan recommended post op PT for right UE deficits   Consulted and Agree with Plan of Care Patient;Family member/caregiver   Family Member Consulted 5 various family members present     Patient will follow up at outpatient cancer rehab if needed following surgery.  If the patient requires physical therapy at that time, a specific plan will be dictated and sent to the referring physician for approval. The patient was educated today on appropriate basic range of motion exercises to begin post operatively and the importance of attending the After Breast Cancer class following surgery.  Patient was educated today on lymphedema risk reduction practices as it pertains to recommendations that will benefit the patient immediately following surgery.  She verbalized good understanding.  No additional physical therapy is indicated at this time.          G-Codes - May 24, 2015 1624    Functional Assessment Tool Used Clinical Judgement   Functional Limitation Carrying, moving and handling objects   Carrying, Moving and Handling Objects Current Status [phone]) At least 60 percent but less than 80 percent impaired, limited or restricted   Carrying, Moving and Handling Objects Goal Status [id number]) At least 60 percent but less than 80 percent impaired, limited or restricted   Carrying, Moving and Handling Objects Discharge Status [phone]) At least 60 percent but less than 80 percent impaired, limited or restricted       Problem List Patient Active Problem List   Diagnosis Date Noted  . Breast cancer of upper-outer quadrant of right female breast ([place]) 04/30/2015  . CVA (cerebral infarction) 07/05/2014  . Hyperlipidemia 07/05/2014  . Slurred speech 07/04/2014  . Syncope 07/04/2014  . Mild mitral regurgitation by prior echocardiogram 12/20/2011  . Diabetes mellitus ([place]) 02/26/2009  . HYPERLIPIDEMIA 02/26/2009  . OBESITY 02/26/2009  . Essential hypertension 02/26/2009  . CAD 02/26/2009  . GASTROESOPHAGEAL REFLUX DISEASE 02/26/2009  . UTI (urinary tract infection) 02/26/2009    Annia Friendly, PT 2015/05/24 4:26 PM   [facility], [place], [address] Phone: [phone]   Fax:  [phone]  Name: Glenda [name] MRN: [id number] Date of Birth: Oct 01, 1933

## 2015-05-07 NOTE — H&P (Signed)
atherine Edley 05/07/2015 8:24 AM Location: [facility] Patient #: [id number] DOB: 1934/03/21 Undefined / Language: Cleophus Molt / Race: Black or African American Female  History of Present Illness Marcello Moores A. Lakin Romer MD; 05/07/2015 2:38 PM) Patient words: patient sent at the request of Dr. Sondra Come for right breast DCIS. Patient de or change in either breast.pain, nipple discharge or change in either breast. She undersymmetric densitymammogram which showed a 1.4 cm asymmetric density right breast upper outer quadrant. Core biopsy was done which shows high-grade DCIS 1.4 cm with possible microinvasion. This is ER negative and PR negative. Patient denies any other complaints. She has had a stroke in the past and d.es have some mild right arm weakness she tells me. Her family is with her.   CLINICAL DATA: Screening recall for a possible mass in the upper outer right breast.  EXAM: DIGITAL DIAGNOSTIC RIGHT MAMMOGRAM WITH 3D TOMOSYNTHESIS WITH CAD  ULTRASOUND RIGHT BREAST  COMPARISON: Screening study, 04/01/2015. There are no available exams.  ACR Breast Density Category c: The breast tissue is heterogeneously dense, which may obscure small masses.  FINDINGS: Focal increased opacity the upper outer right breast persists on the 2D and 3D tomosynthesis images. On the CC tomosynthesis image, there is a partly circumscribed oval mildly lobulated mass in the posterior third of the upper-outer quadrant of the right breast. There are no other discrete masses.  Mammographic images were processed with CAD.  On physical exam, there is a firm, small, mobile mass in the lateral right breast.  Targeted ultrasound is performed, showing a hypoechoic oval mass with lobulated partly ill-defined margins in the 9:30 o'clock position, 5 cm the nipple, measuring 13 x 8 x 14 mm. There are no other discrete masses in the right breast. There are no enlarged or abnormal appearing axillary lymph  nodes.  IMPRESSION: Suspicious mass in the 9:30 o'clock position of the right breast. Biopsy is recommended.  RECOMMENDATION: Ultrasound-guided core needle biopsy.  I have discussed the findings and recommendations with the patient. Results were also provided in writing at the conclusion of the visit. If applicable, a reminder letter will be sent to the patient regarding the next appointment.  BI-RADS CATEGORY 4: Suspicious.   Electronically Signed By: Lajean Manes M.D.       ADDITIONAL INFORMATION: PROGNOSTIC INDICATORS Results: IMMUNOHISTOCHEMICAL AND MORPHOMETRIC ANALYSIS PERFORMED MANUALLY Estrogen Receptor: 0%, NEGATIVE Progesterone Receptor: 0%, NEGATIVE COMMENT: The negative hormone receptor study(ies) in this case has an internal positive control. REFERENCE RANGE ESTROGEN RECEPTOR NEGATIVE 0% POSITIVE =>1% REFERENCE RANGE PROGESTERONE RECEPTOR NEGATIVE 0% POSITIVE =>1% All controls stained appropriately Enid Cutter MD Pathologist, Electronic Signature ( Signed 05/01/2015) FINAL DIAGNOSIS Diagnosis Breast, right, needle core biopsy, 9:30 o'clock - DUCTAL CARCINOMA IN SITU. - SEE MICROSCOPIC DESCRIPTION. Microscopic Comment The core biopsies show ductal carcinoma in situ with necrosis. There are foci of DCIS which have irregular contours and 1 of 2 FINAL for YUZUKI, SPLAIN [phone]) Microscopic Comment(continued) immunohistochemistry shows positive basal cell staining with smooth muscle myosin, p63 and cytokeratin 903; however, focal, microscopic invasion cannot be ruled out. The case is discussed with Dr. Glennon Mac on 04/25/15. (JDP:ds 04/29/15) [name] MD Pathologist, Electronic Signature (Case signed 04/29/2015) Specimen Gross and Clinical Information Specimen Comment In formalin 11:45, extracted less than 3 min; right breast mass on mammo and [place] Specimen(s) Obtained: Breast, right, needle core biopsy, 9:30 o'clock Specimen Clinical  Information [facility] favored Gross Received in formalin are three soft pink white tissue.  The patient is a 82 year  old female.   Other Problems Patsey Berthold, [place]; 05/07/2015 8:24 AM) Arthritis Back Pain Breast Cancer Cerebrovascular Accident Cholelithiasis Diabetes Mellitus Gastroesophageal Reflux Disease High blood pressure Hypercholesterolemia Lump In Breast Pulmonary Embolism / Blood Clot in Legs  Past Surgical History Patsey Berthold, [place]; 05/07/2015 8:24 AM) Appendectomy Breast Biopsy Right. Cataract Surgery Bilateral. Gallbladder Surgery - Open Oral Surgery  Diagnostic Studies History Patsey Berthold, [place]; 05/07/2015 8:24 AM) Colonoscopy 5-10 years ago Mammogram within last year Pap Smear >5 years ago  Medication History Patsey Berthold, [place]; 05/07/2015 8:24 AM) No Current Medications Medications Reconciled  Social History Patsey Berthold, CMA; 05/07/2015 8:24 AM) Caffeine use Carbonated beverages. No alcohol use No drug use Tobacco use Never smoker.  Family History Patsey Berthold, [place]; 05/07/2015 8:24 AM) Arthritis Mother. Breast Cancer Sister. Colon Cancer Father. Depression Sister. Diabetes Mellitus Brother, Mother. Heart Disease Brother, Sister. Heart disease in female family member before age 8 Heart disease in female family member before age 79 Hypertension Mother. Malignant Neoplasm Of Pancreas Family Members In General.  Pregnancy / Birth History Patsey Berthold, [place]; 05/07/2015 8:24 AM) Age at menarche 7 years. Age of menopause <47 Gravida 8 Irregular periods Maternal age 59-25 Para 5     Review of Systems Patsey Berthold CMA; 05/07/2015 8:24 AM) General Present- Appetite Loss. Not Present- Chills, Fatigue, Fever, Night Sweats, Weight Gain and Weight Loss. Skin Not Present- Change in Wart/Mole, Dryness, Hives, Jaundice, New Lesions, Non-Healing Wounds, Rash and Ulcer. HEENT Present- Earache, Hearing Loss,  Ringing in the Ears and Wears glasses/contact lenses. Not Present- Hoarseness, Nose Bleed, Oral Ulcers, Seasonal Allergies, Sinus Pain, Sore Throat, Visual Disturbances and Yellow Eyes. Respiratory Present- Snoring. Not Present- Bloody sputum, Chronic Cough, Difficulty Breathing and Wheezing. Breast Not Present- Breast Mass, Breast Pain, Nipple Discharge and Skin Changes. Cardiovascular Present- Leg Cramps and Swelling of Extremities. Not Present- Chest Pain, Difficulty Breathing Lying Down, Palpitations, Rapid Heart Rate and Shortness of Breath. Gastrointestinal Present- Chronic diarrhea, Difficulty Swallowing, Excessive gas, Gets full quickly at meals, Hemorrhoids, Indigestion and Vomiting. Not Present- Abdominal Pain, Bloating, Bloody Stool, Change in Bowel Habits, Constipation, Nausea and Rectal Pain. Female Genitourinary Not Present- Frequency, Nocturia, Painful Urination, Pelvic Pain and Urgency. Musculoskeletal Present- Back Pain, Muscle Pain and Muscle Weakness. Not Present- Joint Pain, Joint Stiffness and Swelling of Extremities. Neurological Present- Trouble walking and Weakness. Not Present- Decreased Memory, Fainting, Headaches, Numbness, Seizures, Tingling and Tremor. Psychiatric Not Present- Anxiety, Bipolar, Change in Sleep Pattern, Depression, Fearful and Frequent crying. Endocrine Not Present- Cold Intolerance, Excessive Hunger, Hair Changes, Heat Intolerance, Hot flashes and New Diabetes.   Physical Exam (Jerad Dunlap A. Jun Rightmyer MD; 05/07/2015 2:39 PM)  General Mental Status-Alert. General Appearance-Consistent with stated age. Hydration-Well hydrated. Voice-Normal.  Head and Neck Head-normocephalic, atraumatic with no lesions or palpable masses. Trachea-midline. Thyroid Gland Characteristics - normal size and consistency.  Chest and Lung Exam Chest and lung exam reveals -quiet, even and easy respiratory effort with no use of accessory muscles and on auscultation,  normal breath sounds, no adventitious sounds and normal vocal resonance. Inspection Chest Wall - Normal. Back - normal.  Breast Breast - Left-Symmetric, Non Tender, No Biopsy scars, no Dimpling, No Inflammation, No Lumpectomy scars, No Mastectomy scars, No Peau d' Orange. Breast - Right-Symmetric, Non Tender, No Biopsy scars, no Dimpling, No Inflammation, No Lumpectomy scars, No Mastectomy scars, No Peau d' Orange. Breast Lump-No Palpable Breast Mass.  Cardiovascular Cardiovascular examination reveals -normal heart sounds, regular rate and rhythm with no murmurs and normal pedal  pulses bilaterally.  Neurologic Note: mild right arm weakness for bicep and tricep strength. mild weakness with abduction right arm  Musculoskeletal Normal Exam - Left-Upper Extremity Strength Normal and Lower Extremity Strength Normal. Normal Exam - Right-Upper Extremity Strength Normal and Lower Extremity Strength Normal.  Lymphatic Head & Neck  General Head & Neck Lymphatics: Bilateral - Description - Normal. Axillary  General Axillary Region: Bilateral - Description - Normal. Tenderness - Non Tender.    Assessment & Plan (Suhail Peloquin A. Mavis Fichera MD; 05/07/2015 2:41 PM)  BREAST NEOPLASM, TIS (DCIS), RIGHT (D05.11) Impression: the patient desires breast conservation. She does have high-grade DCIS noted the possibility of microinvasion. I have discussed mastectomy with reconstruction with her as well. She would like to proceed with right breast seed localized lumpectomy and sentinel lymph node mapping due to her possibility of microinvasion. Risk of lumpectomy include bleeding, infection, seroma, more surgery, use of seed/wire, wound care, cosmetic deformity and the need for other treatments, death , blood clots, death. Pt agrees to proceed. Risk of sentinel lymph node mapping include bleeding, infection, lymphedema, shoulder pain. stiffness, dye allergy. cosmetic deformity , blood clots, death, need  for more surgery. Pt agres to proceed.  Current Plans You are being scheduled for surgery - Our schedulers will call you.  You should hear from our office's scheduling department within 5 working days about the location, date, and time of surgery. We try to make accommodations for patient's preferences in scheduling surgery, but sometimes the OR schedule or the surgeon's schedule prevents [place] from making those accommodations.  If you have not heard from our office [phone]) in 5 working days, call the office and ask for your surgeon's nurse.  If you have other questions about your diagnosis, plan, or surgery, call the office and ask for your surgeon's nurse.  Pt Education - [facility] Breast Cancer Information Given - [facility] "Breast Journey" Package Pt Education - Pamphlet Given - Breast Biopsy: discussed with patient and provided information. We discussed the staging and pathophysiology of breast cancer. We discussed all of the different options for treatment for breast cancer including surgery, chemotherapy, radiation therapy, Herceptin, and antiestrogen therapy. We discussed a sentinel lymph node biopsy as she does not appear to having lymph node involvement right now. We discussed the performance of that with injection of radioactive tracer and blue dye. We discussed that she would have an incision underneath her axillary hairline. We discussed that there is a bout a 10-20% chance of having a positive node with a sentinel lymph node biopsy and we will await the permanent pathology to make any other first further decisions in terms of her treatment. One of these options might be to return to the operating room to perform an axillary lymph node dissection. We discussed about a 1-2% risk lifetime of chronic shoulder pain as well as lymphedema associated with a sentinel lymph node biopsy. We discussed the options for treatment of the breast cancer which included lumpectomy versus a mastectomy. We  discussed the performance of the lumpectomy with a wire placement. We discussed a 10-20% chance of a positive margin requiring reexcision in the operating room. We also discussed that she may need radiation therapy or antiestrogen therapy or both if she undergoes lumpectomy. We discussed the mastectomy and the postoperative care for that as well. We discussed that there is no difference in her survival whether she undergoes lumpectomy with radiation therapy or antiestrogen therapy versus a mastectomy. There is a slight difference in the local recurrence  rate being 3-5% with lumpectomy and about 1% with a mastectomy. We discussed the risks of operation including bleeding, infection, possible reoperation. She understands her further therapy will be based on what her stages at the time of her operation.  Pt Education - [facility] Breast Biopsy HCI: discussed with patient and provided information. Pt Education - ABC (After Breast Cancer) Class Info: discussed with patient and provided information.

## 2015-05-07 NOTE — Progress Notes (Signed)
Radiation Oncology         [phone] ________________________________  Initial Outpatient Consultation  Name: Glenda [name] MRN: [id number]  Date: 05/07/2015  DOB: [birth year]  BZ:JIRC Alroy Dust, MD  Erroll Luna, MD   REFERRING PHYSICIAN: Erroll Luna, MD  DIAGNOSIS:    ICD-9-CM ICD-10-CM   1. Breast cancer of upper-outer quadrant of right female breast ([place]) 174.4 C50.411    Ductal carcinoma of the right breast that are ER and PR negative. High-grade,  possible early invasion  HISTORY OF PRESENT ILLNESS::Glenda [name] is a 82 y.o. female who had an abnormal mammogram in December 2016. She had a biopsy 04/24/2015 by Dr. Luberta Robertson, which revealed ductal carcinoma in situ with necrosis. She is accompanied by her five children. No breast symptoms prior to biopsy  PREVIOUS RADIATION THERAPY: No  PAST MEDICAL HISTORY:  has a past medical history of GERD (gastroesophageal reflux disease); Obesity; DM (diabetes mellitus); HTN (hypertension); HLD (hyperlipidemia); CAD (coronary artery disease); UTI (urinary tract infection); Chest pain; and Dizziness.    PAST SURGICAL HISTORY: Past Surgical History  Procedure Laterality Date  . Cholecystectomy    . Colon polyps; hx    . Coronary angioplasty  02/14/09    FAMILY HISTORY: family history includes Cancer in her father; Diabetes in her mother and sister.  SOCIAL HISTORY:  reports that she has never smoked. She does not have any smokeless tobacco history on file. She reports that she does not drink alcohol or use illicit drugs.  ALLERGIES: Review of patient's allergies indicates no known allergies.  MEDICATIONS:  Current Outpatient Prescriptions  Medication Sig Dispense Refill  . acetaminophen (TYLENOL) 500 MG tablet Take 1,000 mg by mouth every 6 (six) hours as needed for moderate pain.    Marland Kitchen amLODipine (NORVASC) 10 MG tablet Take 10 mg by mouth daily.    Marland Kitchen aspirin 325 MG tablet Take 1 tablet (325 mg total) by mouth  daily. 30 tablet 1  . ciprofloxacin (CIPRO) 250 MG tablet Take 1 tablet (250 mg total) by mouth 2 (two) times daily. (Patient not taking: Reported on 01/02/2015) 8 tablet 0  . clopidogrel (PLAVIX) 75 MG tablet Take 1 tablet (75 mg total) by mouth daily with breakfast. 30 tablet 1  . furosemide (LASIX) 80 MG tablet Take 80 mg by mouth daily.    Marland Kitchen HUMALOG KWIKPEN 100 UNIT/ML KiwkPen Inject 40 Units into the skin 3 (three) times daily after meals.   3  . HYDROcodone-acetaminophen (NORCO/VICODIN) 5-325 MG per tablet Take 1 tablet by mouth every 6 (six) hours as needed for moderate pain or severe pain. 15 tablet 0  . labetalol (NORMODYNE) 300 MG tablet Take 300 mg by mouth 2 (two) times daily.      Marland Kitchen omeprazole (PRILOSEC) 20 MG capsule Take 20 mg by mouth every morning.     . potassium chloride (KLOR-CON) 10 MEQ CR tablet Take 10 mEq by mouth daily.      . pravastatin (PRAVACHOL) 80 MG tablet Take 1 tablet (80 mg total) by mouth daily. 30 tablet 5  . TOUJEO SOLOSTAR 300 UNIT/ML SOPN Inject 20 Units into the skin every morning.  11  . valsartan-hydrochlorothiazide (DIOVAN-HCT) 320-25 MG per tablet Take 1 tablet by mouth daily.     No current facility-administered medications for this encounter.    REVIEW OF SYSTEMS:  A 15 point review of systems is documented in the electronic medical record. This was obtained by the nursing staff. However, I reviewed this with the patient to  discuss relevant findings and make appropriate changes.  Pertinent items are noted in HPI. no breast pain nipple discharge or bleeding from either breast prior to biopsy   PHYSICAL EXAM:  Vitals - 1 value per visit 09/10/6158  SYSTOLIC 737  DIASTOLIC 61  Pulse 60  Temperature 98  Respirations 18  Weight (lb) 158.8  Height '5\' 6"'   BMI 25.64  VISIT REPORT    General: Alert and oriented, in no acute distress HEENT: Head is normocephalic. Extraocular movements are intact. Oropharynx is clear. Neck: Neck is supple, no palpable  cervical or supraclavicular lymphadenopathy. Heart: Regular in rate and rhythm with no murmurs, rubs, or gallops. Chest: Clear to auscultation bilaterally, with no rhonchi, wheezes, or rales. Abdomen: Soft, nontender, nondistended, with no rigidity or guarding. Extremities: No cyanosis or edema. Lymphatics: see Neck Exam Neurologic:  No obvious focalities. Speech is fluent. Coordination is intact. Psychiatric: Judgment and insight are intact. Affect is appropriate. Left breast exam shows no palpable mass or nipple discharge. Examination of the right breast reveals some mild thickening in the lateral aspect of the breast from recent biopsy. No palpable mass nipple discharge or bleeding    ECOG = 0  LABORATORY DATA:  Lab Results  Component Value Date   WBC 6.1 07/05/2014   HGB 11.1* 07/05/2014   HCT 32.5* 07/05/2014   MCV 80.0 07/05/2014   PLT 182 07/05/2014   NEUTROABS 3.4 07/04/2014   Lab Results  Component Value Date   NA 138 07/05/2014   K 3.9 07/05/2014   CL 107 07/05/2014   CO2 24 07/05/2014   GLUCOSE 262* 07/05/2014   CREATININE 1.13* 07/05/2014   CALCIUM 8.7 07/05/2014      RADIOGRAPHY: Mm Digital Diagnostic Unilat R  04/24/2015  CLINICAL DATA:  Post right breast ultrasound-guided biopsy. EXAM: DIAGNOSTIC RIGHT MAMMOGRAM POST ULTRASOUND BIOPSY COMPARISON:  Previous exam(s). FINDINGS: Mammographic images were obtained following ultrasound guided biopsy of the mass located within the right breast at the 9:30 o'clock position. The ribbon shaped clip is in appropriate position. IMPRESSION: Appropriate position of clip following right breast ultrasound-guided biopsy. Final Assessment: Post Procedure Mammograms for Marker Placement Electronically Signed   By: Altamese Cabal M.D.   On: 04/24/2015 12:16   US Breast Ltd Uni Right Inc Axilla  04/10/2015  CLINICAL DATA:  Screening recall for a possible mass in the upper outer right breast. EXAM: DIGITAL DIAGNOSTIC RIGHT  MAMMOGRAM WITH 3D TOMOSYNTHESIS WITH CAD ULTRASOUND RIGHT BREAST COMPARISON:  Screening study, 04/01/2015. There are no available exams. ACR Breast Density Category c: The breast tissue is heterogeneously dense, which may obscure small masses. FINDINGS: Focal increased opacity the upper outer right breast persists on the 2D and 3D tomosynthesis images. On the CC tomosynthesis image, there is a partly circumscribed oval mildly lobulated mass in the posterior third of the upper-outer quadrant of the right breast. There are no other discrete masses. Mammographic images were processed with CAD. On physical exam, there is a firm, small, mobile mass in the lateral right breast. Targeted ultrasound is performed, showing a hypoechoic oval mass with lobulated partly ill-defined margins in the 9:30 o'clock position, 5 cm the nipple, measuring 13 x 8 x 14 mm. There are no other discrete masses in the right breast. There are no enlarged or abnormal appearing axillary lymph nodes. IMPRESSION: Suspicious mass in the 9:30 o'clock position of the right breast. Biopsy is recommended. RECOMMENDATION: Ultrasound-guided core needle biopsy. I have discussed the findings and recommendations with the patient. Results  were also provided in writing at the conclusion of the visit. If applicable, a reminder letter will be sent to the patient regarding the next appointment. BI-RADS CATEGORY  4: Suspicious. Electronically Signed   By: Lajean Manes M.D.   On: 04/10/2015 12:07   Mm Diag Breast Tomo Uni Right  04/10/2015  CLINICAL DATA:  Screening recall for a possible mass in the upper outer right breast. EXAM: DIGITAL DIAGNOSTIC RIGHT MAMMOGRAM WITH 3D TOMOSYNTHESIS WITH CAD ULTRASOUND RIGHT BREAST COMPARISON:  Screening study, 04/01/2015. There are no available exams. ACR Breast Density Category c: The breast tissue is heterogeneously dense, which may obscure small masses. FINDINGS: Focal increased opacity the upper outer right breast  persists on the 2D and 3D tomosynthesis images. On the CC tomosynthesis image, there is a partly circumscribed oval mildly lobulated mass in the posterior third of the upper-outer quadrant of the right breast. There are no other discrete masses. Mammographic images were processed with CAD. On physical exam, there is a firm, small, mobile mass in the lateral right breast. Targeted ultrasound is performed, showing a hypoechoic oval mass with lobulated partly ill-defined margins in the 9:30 o'clock position, 5 cm the nipple, measuring 13 x 8 x 14 mm. There are no other discrete masses in the right breast. There are no enlarged or abnormal appearing axillary lymph nodes. IMPRESSION: Suspicious mass in the 9:30 o'clock position of the right breast. Biopsy is recommended. RECOMMENDATION: Ultrasound-guided core needle biopsy. I have discussed the findings and recommendations with the patient. Results were also provided in writing at the conclusion of the visit. If applicable, a reminder letter will be sent to the patient regarding the next appointment. BI-RADS CATEGORY  4: Suspicious. Electronically Signed   By: Lajean Manes M.D.   On: 04/10/2015 12:07   [place] Rt Breast Bx W Loc Dev 1st Lesion Img Bx Spec US Guide  04/29/2015  ADDENDUM REPORT: 04/29/2015 15:07 ADDENDUM: Pathology reveals DUCTAL CARCINOMA IN SITU of the Right breast at the 9:30 o'clock location. The core biopsies show ductal carcinoma in situ with necrosis. This was found to be concordant by Darrol Poke. Pathology results were discussed with the patient's daughter, Nikolette Reindl, via telephone. She reported that her mother had no problems with the biopsy site and is doing well. Post biopsy care and instructions were reviewed and her questions were answered. She was encouraged to contact [facility] with any additional questions and or concerns. Mrs. Botkin was referred to [facility]  [facility] on May 07, 2015. Pathology results reported by Terie Purser RN on April 29, 2015. Electronically Signed   By: Altamese Cabal M.D.   On: 04/29/2015 15:07  04/29/2015  CLINICAL DATA:  Right breast mass. EXAM: ULTRASOUND GUIDED RIGHT BREAST CORE NEEDLE BIOPSY COMPARISON:  Previous exam(s). FINDINGS: I met with the patient and we discussed the procedure of ultrasound-guided biopsy, including benefits and alternatives. We discussed the high likelihood of a successful procedure. We discussed the risks of the procedure, including infection, bleeding, tissue injury, clip migration, and inadequate sampling. Informed written consent was given. The usual time-out protocol was performed immediately prior to the procedure. Using sterile technique and 2% Lidocaine as local anesthetic, under direct ultrasound visualization, a 14 gauge spring-loaded device was used to perform biopsy of mass located within the right breast at the 9:30 o'clock position using a medial approach. At the conclusion of the procedure a ribbon shaped tissue  marker clip was deployed into the biopsy cavity. Follow up 2 view mammogram was performed and dictated separately. IMPRESSION: Ultrasound guided biopsy of the right breast mass located at the 9:30 o'clock position as discussed above. No apparent complications. Electronically Signed: By: Altamese Cabal M.D. On: 04/24/2015 12:15      IMPRESSION: Ms. Horrell is a 82 yo woman with high grade ductal carcinoma of the right breast possible early invasion, ER and PR negative. She is a good candidate for a lumpectomy and sentinel node procedure and radiation post-surgery. She, and her family, prefer a lumpectomy instead of a mastectomy. No plans for adjuvant hormonal therapy given the ER PR negativity.  PLAN: She will be scheduled for for surgery and then be seen in the postoperative setting for further discussion of adjuvant radiation therapy.      ------------------------------------------------  Blair Promise, PhD, MD    This document serves as a record of services personally performed by Gery Pray, MD. It was created on his behalf by Lendon Collar, a trained medical scribe. The creation of this record is based on the scribe's personal observations and the provider's statements to them. This document has been checked and approved by the attending provider.

## 2015-05-07 NOTE — Patient Instructions (Signed)

## 2015-05-07 NOTE — Progress Notes (Signed)
Glenda [name] is a very pleasant 82 y.o. female from  [place], [place] with newly diagnosed ductal carcinoma in situ of the right breast.  Biopsy results revealed the tumor's prognostic profile is ER negative and PR negative.  She presents today with her daughters to the [facility]) for treatment consideration and recommendations from the breast surgeon, radiation oncologist, and medical oncologist.     I briefly met with Glenda [name] and her daughter during her [facility] visit today. We discussed the purpose of the Survivorship Clinic, which will include monitoring for recurrence, coordinating completion of age and gender-appropriate cancer screenings, promotion of overall wellness, as well as managing potential late/long-term side effects of anti-cancer treatments.    The treatment plan for Glenda [name] will likely include surgery and radiation therapy.  As of today, the intent of treatment for Glenda [name] is cure, therefore she will be eligible for the Survivorship Clinic upon her completion of treatment.  Her survivorship care plan (SCP) document will be drafted and updated throughout the course of her treatment trajectory. She will receive the SCP in an office visit with myself in the Survivorship Clinic once she has completed treatment.   Glenda [name] was encouraged to ask questions and all questions were answered to her satisfaction.  She was given my business card and encouraged to contact me with any concerns regarding survivorship.  I look forward to participating in her care.   Kenn File, [place] [phone]

## 2015-05-07 NOTE — Assessment & Plan Note (Signed)
Right breast biopsy 9:30 position: DCIS high-grade with necrosis and calcifications,? Early invasive disease, ER 0% PR 0%; 1.3 x 0.8 x 1.4 cm right breast asymmetry.  Pathology review: I discussed with the patient the difference between DCIS and invasive breast cancer. It is considered a precancerous lesion. DCIS is classified as a 0. It is generally detected through mammograms as calcifications. We discussed the significance of grades and its impact on prognosis. We also discussed the importance of ER and PR receptors and their implications to adjuvant treatment options. Prognosis of DCIS dependence on grade, comedo necrosis. It is anticipated that if not treated, 20-30% of DCIS can develop into invasive breast cancer.  Recommendation: 1. Breast conserving surgery 2. Followed by adjuvant radiation therapy  Return to clinic after surgery to discuss the final pathology report and come up with an adjuvant treatment plan.

## 2015-05-08 ENCOUNTER — Encounter: Payer: Self-pay | Admitting: General Practice

## 2015-05-08 NOTE — Progress Notes (Signed)
[place] Psychosocial Distress Screening Spiritual Care  Shadowed by counseling intern Glenda [name], met with Glenda [name] and her five children at [facility] to introduce [place] team/resources, reviewing distress screen per protocol.  The patient scored a 7 on the Psychosocial Distress Thermometer which indicates severe distress. Also assessed for distress and other psychosocial needs.   ONCBCN DISTRESS SCREENING 05/08/2015  Screening Type Initial Screening  Distress experienced in past week (1-10) 7  Emotional problem type Nervousness/Anxiety;Adjusting to illness  Physical Problem type Nausea/vomiting;Getting around;Loss of appetitie  Referral to support programs Yes  Other Spiritual Care, counseling interns   Per Glenda [name], [facility] was very helpful/informative, reducing her distress to 2.  She reports very strong support from family.  Her children were active in conversation, asking questions and encouraging resources for pt (and themselves).  Follow up needed: No.  Pt and family aware of ongoing [place] team/resource availability and plan to reach out as desired.  Please also page as needs arise/circumstances change.  Thank you.  [place], [place], [facility] Pager [phone] Voicemail  [phone]

## 2015-05-09 ENCOUNTER — Other Ambulatory Visit: Payer: Self-pay | Admitting: Surgery

## 2015-05-09 DIAGNOSIS — D0511 Intraductal carcinoma in situ of right breast: Secondary | ICD-10-CM

## 2015-05-12 ENCOUNTER — Telehealth: Payer: Self-pay | Admitting: *Deleted

## 2015-05-12 NOTE — Telephone Encounter (Signed)
Spoke to pt concerning Glenda [name] from 05/07/15. Denies questions or concerns regarding dx or treatment care plan. Encourage pt to call with needs. Received verbal understanding. Contact information provided.

## 2015-05-13 ENCOUNTER — Telehealth: Payer: Self-pay | Admitting: Hematology and Oncology

## 2015-05-13 NOTE — Telephone Encounter (Signed)
Called patient re 2/15 f/u w/VG. Per patient called dtr Runell Gess with appointment at [phone] - left message re appointment and mailed schedule.

## 2015-05-21 ENCOUNTER — Encounter (HOSPITAL_BASED_OUTPATIENT_CLINIC_OR_DEPARTMENT_OTHER): Payer: Self-pay | Admitting: *Deleted

## 2015-05-22 ENCOUNTER — Encounter (HOSPITAL_BASED_OUTPATIENT_CLINIC_OR_DEPARTMENT_OTHER)
Admission: RE | Admit: 2015-05-22 | Discharge: 2015-05-22 | Disposition: A | Discharge status: Home / Self Care | Payer: Medicare Other | Source: Ambulatory Visit | Attending: Surgery | Admitting: Surgery

## 2015-05-22 ENCOUNTER — Encounter (HOSPITAL_BASED_OUTPATIENT_CLINIC_OR_DEPARTMENT_OTHER): Payer: Self-pay | Admitting: Anesthesiology

## 2015-05-22 DIAGNOSIS — Z8673 Personal history of transient ischemic attack (TIA), and cerebral infarction without residual deficits: Secondary | ICD-10-CM | POA: Insufficient documentation

## 2015-05-22 DIAGNOSIS — D0511 Intraductal carcinoma in situ of right breast: Secondary | ICD-10-CM | POA: Insufficient documentation

## 2015-05-22 DIAGNOSIS — Z01812 Encounter for preprocedural laboratory examination: Secondary | ICD-10-CM | POA: Insufficient documentation

## 2015-05-22 DIAGNOSIS — Z86711 Personal history of pulmonary embolism: Secondary | ICD-10-CM | POA: Insufficient documentation

## 2015-05-22 DIAGNOSIS — E7800 Pure hypercholesterolemia, unspecified: Secondary | ICD-10-CM | POA: Diagnosis not present

## 2015-05-22 DIAGNOSIS — M1990 Unspecified osteoarthritis, unspecified site: Secondary | ICD-10-CM | POA: Diagnosis not present

## 2015-05-22 DIAGNOSIS — E119 Type 2 diabetes mellitus without complications: Secondary | ICD-10-CM | POA: Insufficient documentation

## 2015-05-22 DIAGNOSIS — I10 Essential (primary) hypertension: Secondary | ICD-10-CM | POA: Diagnosis not present

## 2015-05-22 LAB — COMPREHENSIVE METABOLIC PANEL
ALK PHOS: 79 U/L (ref 38–126)
ALT: 10 U/L — AB (ref 14–54)
AST: 17 U/L (ref 15–41)
Albumin: 2.8 g/dL — ABNORMAL LOW (ref 3.5–5.0)
Anion gap: 6 (ref 5–15)
BUN: 25 mg/dL — AB (ref 6–20)
CALCIUM: 8.4 mg/dL — AB (ref 8.9–10.3)
CO2: 25 mmol/L (ref 22–32)
CREATININE: 1.54 mg/dL — AB (ref 0.44–1.00)
Chloride: 111 mmol/L (ref 101–111)
GFR calc non Af Amer: 31 mL/min — ABNORMAL LOW (ref >60)
GFR, EST AFRICAN AMERICAN: 36 mL/min — AB (ref >60)
GLUCOSE: 292 mg/dL — AB (ref 65–99)
Potassium: 3.6 mmol/L (ref 3.5–5.1)
SODIUM: 142 mmol/L (ref 135–145)
Total Bilirubin: 0.3 mg/dL (ref 0.3–1.2)
Total Protein: 6 g/dL — ABNORMAL LOW (ref 6.5–8.1)

## 2015-05-22 LAB — CBC WITH DIFFERENTIAL/PLATELET
Basophils Absolute: 0 10*3/uL (ref 0.0–0.1)
Basophils Relative: 1 %
EOS ABS: 0.1 10*3/uL (ref 0.0–0.7)
Eosinophils Relative: 2 %
HCT: 29.5 % — ABNORMAL LOW (ref 36.0–46.0)
HEMOGLOBIN: 9.7 g/dL — AB (ref 12.0–15.0)
LYMPHS ABS: 1.8 10*3/uL (ref 0.7–4.0)
LYMPHS PCT: 44 %
MCH: 27.3 pg (ref 26.0–34.0)
MCHC: 32.9 g/dL (ref 30.0–36.0)
MCV: 83.1 fL (ref 78.0–100.0)
Monocytes Absolute: 0.4 10*3/uL (ref 0.1–1.0)
Monocytes Relative: 10 %
NEUTROS PCT: 44 %
Neutro Abs: 1.8 10*3/uL (ref 1.7–7.7)
Platelets: 209 10*3/uL (ref 150–400)
RBC: 3.55 MIL/uL — AB (ref 3.87–5.11)
RDW: 12.7 % (ref 11.5–15.5)
WBC: 4.1 10*3/uL (ref 4.0–10.5)

## 2015-05-22 NOTE — Progress Notes (Signed)
Lab results review no change at present

## 2015-05-22 NOTE — Progress Notes (Signed)
Cardiology notes and EKG reviewed by Dr. Al Corpus. Pt ok for breast surgery here at [facility] 05/28/15 with Dr. Brantley Stage.

## 2015-05-22 NOTE — Progress Notes (Signed)
bp checked at PAT visit. Lt   196/60  Rt 201/66 , spoke with dr crews instructions for pt to take prilosec , norvas, and bystoylic day of surgery. No diabetic meds day of surgery, written instructions given to daughter who was with her. Daughter voiced understanding,.

## 2015-05-23 ENCOUNTER — Ambulatory Visit
Admission: RE | Admit: 2015-05-23 | Discharge: 2015-05-23 | Disposition: A | Discharge status: Home / Self Care | Payer: Medicare Other | Source: Ambulatory Visit | Attending: Surgery | Admitting: Surgery

## 2015-05-23 DIAGNOSIS — D0511 Intraductal carcinoma in situ of right breast: Secondary | ICD-10-CM

## 2015-05-28 ENCOUNTER — Ambulatory Visit (HOSPITAL_BASED_OUTPATIENT_CLINIC_OR_DEPARTMENT_OTHER): Payer: Medicare Other

## 2015-05-28 ENCOUNTER — Ambulatory Visit
Admission: RE | Admit: 2015-05-28 | Discharge: 2015-05-28 | Disposition: A | Discharge status: Home / Self Care | Payer: Medicare Other | Source: Ambulatory Visit | Attending: Surgery | Admitting: Surgery

## 2015-05-28 ENCOUNTER — Ambulatory Visit (HOSPITAL_BASED_OUTPATIENT_CLINIC_OR_DEPARTMENT_OTHER): Admission: RE | Admit: 2015-05-28 | Payer: Medicare Other | Source: Ambulatory Visit | Admitting: Surgery

## 2015-05-28 DIAGNOSIS — D0511 Intraductal carcinoma in situ of right breast: Secondary | ICD-10-CM

## 2015-05-28 SURGERY — BREAST LUMPECTOMY WITH RADIOACTIVE SEED AND SENTINEL LYMPH NODE BIOPSY
Anesthesia: General | Site: Breast | Laterality: Right

## 2015-06-10 ENCOUNTER — Encounter: Payer: Self-pay | Admitting: *Deleted

## 2015-06-10 ENCOUNTER — Telehealth: Payer: Self-pay | Admitting: Hematology and Oncology

## 2015-06-10 NOTE — Telephone Encounter (Signed)
per pof to call pt to CX 2/15-appt-*cld & spoke to pt and adv of CXappt

## 2015-06-11 ENCOUNTER — Ambulatory Visit: Payer: Medicare Other

## 2015-06-11 ENCOUNTER — Ambulatory Visit: Payer: Medicare Other | Admitting: Hematology and Oncology

## 2015-06-11 ENCOUNTER — Ambulatory Visit
Admission: RE | Admit: 2015-06-11 | Discharge: 2015-06-11 | Disposition: A | Discharge status: Home / Self Care | Payer: Medicare Other | Source: Ambulatory Visit | Attending: Radiation Oncology | Admitting: Radiation Oncology

## 2015-06-11 DIAGNOSIS — C50411 Malignant neoplasm of upper-outer quadrant of right female breast: Secondary | ICD-10-CM | POA: Insufficient documentation

## 2015-06-11 DIAGNOSIS — Z171 Estrogen receptor negative status [ER-]: Secondary | ICD-10-CM | POA: Insufficient documentation

## 2015-06-11 DIAGNOSIS — Z510 Encounter for antineoplastic radiation therapy: Secondary | ICD-10-CM | POA: Insufficient documentation

## 2015-06-11 DIAGNOSIS — Z9889 Other specified postprocedural states: Secondary | ICD-10-CM | POA: Insufficient documentation

## 2015-06-25 ENCOUNTER — Ambulatory Visit (INDEPENDENT_AMBULATORY_CARE_PROVIDER_SITE_OTHER): Payer: Medicare Other | Admitting: Ophthalmology

## 2015-06-30 ENCOUNTER — Other Ambulatory Visit: Payer: Self-pay | Admitting: *Deleted

## 2015-06-30 ENCOUNTER — Other Ambulatory Visit: Payer: Self-pay | Admitting: Surgery

## 2015-06-30 DIAGNOSIS — R928 Other abnormal and inconclusive findings on diagnostic imaging of breast: Secondary | ICD-10-CM

## 2015-07-01 ENCOUNTER — Telehealth: Payer: Self-pay | Admitting: Hematology and Oncology

## 2015-07-01 NOTE — Telephone Encounter (Signed)
Spoke with patient to confirm appt date/time for 3/30 at 915 am

## 2015-07-09 NOTE — Pre-Procedure Instructions (Addendum)
Glenda [name]  07/09/2015      [facility] - [place], [place] [address] Phone: [phone] Fax: [phone]    Your procedure is scheduled on March 23  Report to [place] at 900 A.M.  Call this number if you have problems the morning of surgery:  [phone]   Remember:  Do not eat food or drink liquids after midnight.  Take these medicines the morning of surgery with A SIP OF WATER Amlodipine (norvasc), clonazepam (Klonopin) if needed, clonidine (Catapres), Nebivolol HCL (Bystolic), Omeprazole (Prilosec)  Stop taking aspirin, Ibuprofen, Advil, Motrin, Aleve, BC's, Goody's, Herbal medications, Fish Oil  Stop taking Plavix 5 days prior to your surgery day. How to Manage Your Diabetes Before Surgery   Why is it important to control my blood sugar before and after surgery?   Improving blood sugar levels before and after surgery helps healing and can limit problems.  A way of improving blood sugar control is eating a healthy diet by:  - Eating less sugar and carbohydrates  - Increasing activity/exercise  - Talk with your doctor about reaching your blood sugar goals  High blood sugars (greater than 180 mg/dL) can raise your risk of infections and slow down your recovery so you will need to focus on controlling your diabetes during the weeks before surgery.  Make sure that the doctor who takes care of your diabetes knows about your planned surgery including the date and location.  How do I manage my blood sugars before surgery?   Check your blood sugar at least 4 times a day, 2 days before surgery to make sure that they are not too high or low.   Check your blood sugar the morning of your surgery when you wake up and every 2    hours until you get to the Short-Stay unit.  If your blood sugar is less than 70 mg/dL, you will need to treat  for low blood sugar by:  Treat a low blood sugar (less than 70 mg/dL) with 1/2 cup of clear juice (cranberry or apple), 4 glucose tablets, OR glucose gel.  Recheck blood sugar in 15 minutes after treatment (to make sure it is greater than 70 mg/dL).  If blood sugar is not greater than 70 mg/dL on re-check, call [phone] for further instructions.   Report your blood sugar to the Short-Stay nurse when you get to Short-Stay.  References:  [facility], 2007 "How to Manage your Diabetes Before and After Surgery".  What do I do about my diabetes medications?   Do not take oral diabetes medicines (pills) the morning of surgery.   THE MORNING OF SURGERY, take 12units of  Toujeo Insulin.    Do not take other diabetes injectables the day of surgery including Byetta, Victoza, Bydureon, and Trulicity.    If your CBG is greater than 220 mg/dL, you may take 1/2 of your sliding scale (correction) dose of insulin.   Do not wear jewelry, make-up or nail polish.  Do not wear lotions, powders, or perfumes.  You may wear deodorant.  Do not shave 48 hours prior to surgery.  Men may shave face and neck.  Do not bring valuables to the hospital.  [facility] is not responsible for any belongings or valuables.  Contacts, dentures or bridgework may not be worn into surgery.  Leave your  suitcase in the car.  After surgery it may be brought to your room.  For patients admitted to the hospital, discharge time will be determined by your treatment team.  Patients discharged the day of surgery will not be allowed to drive home.    Special instructions:  [place] - Preparing for Surgery  Before surgery, you can play an important role.  Because skin is not sterile, your skin needs to be as free of germs as possible.  You can reduce the number of germs on you skin by washing with CHG (chlorahexidine gluconate) soap before surgery.  CHG is an antiseptic cleaner which kills  germs and bonds with the skin to continue killing germs even after washing.  Please DO NOT use if you have an allergy to CHG or antibacterial soaps.  If your skin becomes reddened/irritated stop using the CHG and inform your nurse when you arrive at Short Stay.  Do not shave (including legs and underarms) for at least 48 hours prior to the first CHG shower.  You may shave your face.  Please follow these instructions carefully:   1.  Shower with CHG Soap the night before surgery and the    morning of Surgery.  2.  If you choose to wash your hair, wash your hair first as usual with your  normal shampoo.  3.  After you shampoo, rinse your hair and body thoroughly to remove the   Shampoo.  4.  Use CHG as you would any other liquid soap.  You can apply chg directly  to the skin and wash gently with scrungie or a clean washcloth.  5.  Apply the CHG Soap to your body ONLY FROM THE NECK DOWN.   Do not use on open wounds or open sores.  Avoid contact with your eyes,    ears, mouth and genitals (private parts).  Wash genitals (private parts)       with your normal soap.  6.  Wash thoroughly, paying special attention to the area where your surgery    will be performed.  7.  Thoroughly rinse your body with warm water from the neck down.  8.  DO NOT shower/wash with your normal soap after using and rinsing off the CHG Soap.  9.  Pat yourself dry with a clean towel.            10.  Wear clean pajamas.            11.  Place clean sheets on your bed the night of your first shower and do not        sleep with pets.  Day of Surgery  Do not apply any lotions/deoderants the morning of surgery.  Please wear clean clothes to the hospital/surgery center.     Please read over the following fact sheets that you were given. Pain Booklet, Coughing and Deep Breathing and Surgical Site Infection Prevention

## 2015-07-10 ENCOUNTER — Encounter (HOSPITAL_COMMUNITY)
Admission: RE | Admit: 2015-07-10 | Discharge: 2015-07-10 | Disposition: A | Discharge status: Home / Self Care | Payer: Medicare Other | Source: Ambulatory Visit | Attending: Surgery | Admitting: Surgery

## 2015-07-10 ENCOUNTER — Encounter (HOSPITAL_COMMUNITY): Payer: Self-pay

## 2015-07-10 DIAGNOSIS — E785 Hyperlipidemia, unspecified: Secondary | ICD-10-CM | POA: Insufficient documentation

## 2015-07-10 DIAGNOSIS — Z8673 Personal history of transient ischemic attack (TIA), and cerebral infarction without residual deficits: Secondary | ICD-10-CM | POA: Diagnosis not present

## 2015-07-10 DIAGNOSIS — Z794 Long term (current) use of insulin: Secondary | ICD-10-CM | POA: Diagnosis not present

## 2015-07-10 DIAGNOSIS — I10 Essential (primary) hypertension: Secondary | ICD-10-CM | POA: Insufficient documentation

## 2015-07-10 DIAGNOSIS — Z79899 Other long term (current) drug therapy: Secondary | ICD-10-CM | POA: Insufficient documentation

## 2015-07-10 DIAGNOSIS — I2510 Atherosclerotic heart disease of native coronary artery without angina pectoris: Secondary | ICD-10-CM | POA: Diagnosis not present

## 2015-07-10 DIAGNOSIS — Z7902 Long term (current) use of antithrombotics/antiplatelets: Secondary | ICD-10-CM | POA: Diagnosis not present

## 2015-07-10 DIAGNOSIS — E119 Type 2 diabetes mellitus without complications: Secondary | ICD-10-CM | POA: Insufficient documentation

## 2015-07-10 DIAGNOSIS — Z01818 Encounter for other preprocedural examination: Secondary | ICD-10-CM | POA: Diagnosis not present

## 2015-07-10 DIAGNOSIS — Z7982 Long term (current) use of aspirin: Secondary | ICD-10-CM | POA: Diagnosis not present

## 2015-07-10 DIAGNOSIS — Z01812 Encounter for preprocedural laboratory examination: Secondary | ICD-10-CM | POA: Diagnosis not present

## 2015-07-10 DIAGNOSIS — C50919 Malignant neoplasm of unspecified site of unspecified female breast: Secondary | ICD-10-CM | POA: Insufficient documentation

## 2015-07-10 DIAGNOSIS — R001 Bradycardia, unspecified: Secondary | ICD-10-CM | POA: Diagnosis not present

## 2015-07-10 DIAGNOSIS — K219 Gastro-esophageal reflux disease without esophagitis: Secondary | ICD-10-CM | POA: Diagnosis not present

## 2015-07-10 HISTORY — DX: Reserved for inherently not codable concepts without codable children: IMO0001

## 2015-07-10 HISTORY — DX: Cerebral infarction, unspecified: I63.9

## 2015-07-10 LAB — BASIC METABOLIC PANEL
ANION GAP: 8 (ref 5–15)
BUN: 28 mg/dL — ABNORMAL HIGH (ref 6–20)
CALCIUM: 8.9 mg/dL (ref 8.9–10.3)
CHLORIDE: 108 mmol/L (ref 101–111)
CO2: 23 mmol/L (ref 22–32)
Creatinine, Ser: 1.65 mg/dL — ABNORMAL HIGH (ref 0.44–1.00)
GFR calc non Af Amer: 28 mL/min — ABNORMAL LOW (ref >60)
GFR, EST AFRICAN AMERICAN: 33 mL/min — AB (ref >60)
GLUCOSE: 358 mg/dL — AB (ref 65–99)
POTASSIUM: 4.5 mmol/L (ref 3.5–5.1)
Sodium: 139 mmol/L (ref 135–145)

## 2015-07-10 LAB — CBC
HEMATOCRIT: 31.3 % — AB (ref 36.0–46.0)
HEMOGLOBIN: 9.9 g/dL — AB (ref 12.0–15.0)
MCH: 25.8 pg — AB (ref 26.0–34.0)
MCHC: 31.6 g/dL (ref 30.0–36.0)
MCV: 81.5 fL (ref 78.0–100.0)
Platelets: 189 10*3/uL (ref 150–400)
RBC: 3.84 MIL/uL — AB (ref 3.87–5.11)
RDW: 13.1 % (ref 11.5–15.5)
WBC: 4.2 10*3/uL (ref 4.0–10.5)

## 2015-07-10 LAB — GLUCOSE, CAPILLARY: Glucose-Capillary: 310 mg/dL — ABNORMAL HIGH (ref 65–99)

## 2015-07-10 NOTE — Progress Notes (Signed)
PCP is Dr Dr Alroy Dust Dr Wylene Simmer manages her DM Cardiologist is Dr. Meda Coffee Reports her fasting cbg's run 130's to 140's, but today before eating it was 212, now it is 310 she reports eating cereal earlier this am and hash browns at 0800 Denies having any chest pain. Request sent to Dr Wylene Simmer for last A1C pt daughter states it was checked last week, but they were not told the results. Echo noted in epic from 07-06-14 Stress test noted in epic form 2015 Card cath noted from 2012

## 2015-07-14 ENCOUNTER — Inpatient Hospital Stay: Admission: RE | Admit: 2015-07-14 | Payer: Medicare Other | Source: Ambulatory Visit

## 2015-07-14 NOTE — Progress Notes (Signed)
Anesthesia Chart Review:  Pt is a 82 year old female scheduled for radioactive seed guided partial mastectomy with axillary sentinel lymph node biopsy on 07/17/2015 with Dr. Brantley Stage.   Endocrinologist is Dr. Delrae Rend. PCP is Dr. Donnie Coffin. Cardiologist is Dr.  Ena Dawley.   PMH includes:  CAD, HTN, DM, hyperlipidemia, stroke, breast cancer, GERD. Never smoker. BMI 25.5.   Medications include: amlodipine, ASA, clonidine, plavix, lasix, humalog, nebivolol, prilosec, toujeo, valsartan-hctz.   Preoperative labs reviewed.   - Glucose 358. HgbA1c was 10 on 07/02/15.  - Cr 1.65, BUN 28. Cr ranged 1.2 to 1.54 over past 5 months.  - H/H 9.9/31.3  EKG 07/10/15: Sinus bradycardia (58 bpm) with short PR. Septal infarct, age undetermined. Nonspecific T wave abnormality.  Echo 07/06/14:  - Left ventricle: The cavity size was normal. Wall thickness was increased in a pattern of mild LVH. Systolic function was normal. The estimated ejection fraction was in the range of 60% to 65%. Wall motion was normal; there were no regional wall motion abnormalities. Doppler parameters are consistent with abnormal left ventricular relaxation (grade 1 diastolic dysfunction). Doppler parameters are consistent with elevated ventricular end-diastolic filling pressure. - Mitral valve: There was mild regurgitation. - Left atrium: The atrium was mildly dilated. - Right atrium: Central venous pressure (est): 3 mm Hg. - Tricuspid valve: There was physiologic regurgitation. - Pulmonary arteries: PA peak pressure: 38 mm Hg (S). - Pericardium, extracardiac: There was no pericardial effusion. - Impressions: Mild LVH with LVEF [id number], grade 1 diastolic dysfunction withincreased filling pressures. Mild left atrial enlargement. Mildmitral regurgitation. Mildly elevated PASP 38 mmHg.  Carotid duplex 07/05/14: Minimal amount of bilateral atherosclerotic plaque, left subjectively greater than right, not resulting in a  hemodynamically significant stenosis.  Nuclear stress test 01/18/14: Low risk stress nuclear study with a fixed defect in the mid and basal inferior wall due to attenuation artifact and increased gut uptake. No ischemia noted. LV Ejection Fraction: 53%. LV Wall Motion: NL LV Function; NL Wall Motion  Cardiac cath 02/14/09: Nonobstructive CAD, unchanged from previous (see below). EF 70%, normal wall motion.   Cardiac cath 10/08/02:  1. Hypodynamic left ventricular function. 2. Scattered disease of the left anterior descending (50-60%) and diagonal (50-60%). 3. Moderate obstruction (60-70%) of the AV circumflex after the large marginal takeoff. 4. Scattered irregularities of the right coronary artery (30-40%)  Reviewed case with Dr. Al Corpus.  If no changes, I anticipate pt can proceed with surgery as scheduled.   Willeen Cass, FNP-BC [facility]/Anesthesiology Phone: [phone] 07/14/2015 4:42 PM

## 2015-07-16 MED ORDER — MIDAZOLAM HCL 2 MG/2ML IJ SOLN
1.0000 mg | INTRAMUSCULAR | Status: DC | PRN
  Administered 2015-07-17: 1 mg via INTRAVENOUS
  Filled 2015-07-16: qty 2

## 2015-07-16 MED ORDER — GLYCOPYRROLATE 0.2 MG/ML IJ SOLN
0.2000 mg | Freq: Once | INTRAMUSCULAR | Status: AC | PRN
  Administered 2015-07-17: 0.2 mg via INTRAVENOUS

## 2015-07-16 MED ORDER — FENTANYL CITRATE (PF) 100 MCG/2ML IJ SOLN
50.0000 ug | INTRAMUSCULAR | Status: DC | PRN
  Administered 2015-07-17: 50 ug via INTRAVENOUS
  Administered 2015-07-17: 150 ug via INTRAVENOUS
  Filled 2015-07-16: qty 2

## 2015-07-16 MED ORDER — SCOPOLAMINE 1 MG/3DAYS TD PT72
1.0000 | MEDICATED_PATCH | Freq: Once | TRANSDERMAL | Status: DC | PRN
  Filled 2015-07-16: qty 1

## 2015-07-16 MED ORDER — LACTATED RINGERS IV SOLN: INTRAVENOUS | Status: DC

## 2015-07-16 MED ORDER — CEFAZOLIN SODIUM-DEXTROSE 2-4 GM/100ML-% IV SOLN
2.0000 g | INTRAVENOUS | Status: DC
  Filled 2015-07-16: qty 100

## 2015-07-17 ENCOUNTER — Ambulatory Visit
Admission: RE | Admit: 2015-07-17 | Discharge: 2015-07-17 | Disposition: A | Discharge status: Home / Self Care | Payer: Medicare Other | Source: Ambulatory Visit | Attending: Surgery | Admitting: Surgery

## 2015-07-17 ENCOUNTER — Encounter (HOSPITAL_COMMUNITY): Payer: Self-pay | Admitting: Surgery

## 2015-07-17 ENCOUNTER — Encounter (HOSPITAL_COMMUNITY)
Admission: RE | Disposition: A | Discharge status: Home / Self Care | Payer: Self-pay | Source: Ambulatory Visit | Attending: Surgery

## 2015-07-17 ENCOUNTER — Ambulatory Visit (HOSPITAL_COMMUNITY)
Admission: RE | Admit: 2015-07-17 | Discharge: 2015-07-17 | Disposition: A | Discharge status: Home / Self Care | Payer: Medicare Other | Source: Ambulatory Visit | Attending: Surgery | Admitting: Surgery

## 2015-07-17 ENCOUNTER — Ambulatory Visit (HOSPITAL_COMMUNITY): Payer: Medicare Other | Admitting: Anesthesiology

## 2015-07-17 ENCOUNTER — Ambulatory Visit (HOSPITAL_COMMUNITY): Payer: Medicare Other | Admitting: Emergency Medicine

## 2015-07-17 DIAGNOSIS — I10 Essential (primary) hypertension: Secondary | ICD-10-CM | POA: Insufficient documentation

## 2015-07-17 DIAGNOSIS — I2510 Atherosclerotic heart disease of native coronary artery without angina pectoris: Secondary | ICD-10-CM | POA: Diagnosis not present

## 2015-07-17 DIAGNOSIS — K219 Gastro-esophageal reflux disease without esophagitis: Secondary | ICD-10-CM | POA: Diagnosis not present

## 2015-07-17 DIAGNOSIS — Z8673 Personal history of transient ischemic attack (TIA), and cerebral infarction without residual deficits: Secondary | ICD-10-CM | POA: Diagnosis not present

## 2015-07-17 DIAGNOSIS — E119 Type 2 diabetes mellitus without complications: Secondary | ICD-10-CM | POA: Diagnosis not present

## 2015-07-17 DIAGNOSIS — C50311 Malignant neoplasm of lower-inner quadrant of right female breast: Secondary | ICD-10-CM | POA: Insufficient documentation

## 2015-07-17 DIAGNOSIS — D0511 Intraductal carcinoma in situ of right breast: Secondary | ICD-10-CM

## 2015-07-17 DIAGNOSIS — Z86711 Personal history of pulmonary embolism: Secondary | ICD-10-CM | POA: Diagnosis not present

## 2015-07-17 DIAGNOSIS — M1990 Unspecified osteoarthritis, unspecified site: Secondary | ICD-10-CM | POA: Diagnosis not present

## 2015-07-17 DIAGNOSIS — R928 Other abnormal and inconclusive findings on diagnostic imaging of breast: Secondary | ICD-10-CM

## 2015-07-17 HISTORY — PX: RADIOACTIVE SEED GUIDED PARTIAL MASTECTOMY WITH AXILLARY SENTINEL LYMPH NODE BIOPSY: SHX6520

## 2015-07-17 LAB — GLUCOSE, CAPILLARY
Glucose-Capillary: 161 mg/dL — ABNORMAL HIGH (ref 65–99)
Glucose-Capillary: 165 mg/dL — ABNORMAL HIGH (ref 65–99)

## 2015-07-17 SURGERY — RADIOACTIVE SEED GUIDED PARTIAL MASTECTOMY WITH AXILLARY SENTINEL LYMPH NODE BIOPSY
Anesthesia: General | Site: Breast | Laterality: Right

## 2015-07-17 MED ORDER — LIDOCAINE HCL (CARDIAC) 20 MG/ML IV SOLN
INTRAVENOUS | Status: DC | PRN
  Administered 2015-07-17: 60 mg via INTRAVENOUS

## 2015-07-17 MED ORDER — 0.9 % SODIUM CHLORIDE (POUR BTL) OPTIME
TOPICAL | Status: DC | PRN
  Administered 2015-07-17: 1000 mL

## 2015-07-17 MED ORDER — LACTATED RINGERS IV SOLN
INTRAVENOUS | Status: DC
  Administered 2015-07-17 (×2): via INTRAVENOUS

## 2015-07-17 MED ORDER — LABETALOL HCL 5 MG/ML IV SOLN
5.0000 mg | INTRAVENOUS | Status: DC | PRN
  Administered 2015-07-17: 5 mg via INTRAVENOUS

## 2015-07-17 MED ORDER — BUPIVACAINE-EPINEPHRINE 0.25% -1:200000 IJ SOLN
INTRAMUSCULAR | Status: DC | PRN
  Administered 2015-07-17: 4.5 mL

## 2015-07-17 MED ORDER — ROPIVACAINE HCL 5 MG/ML IJ SOLN
INTRAMUSCULAR | Status: DC | PRN
  Administered 2015-07-17: 30 mL

## 2015-07-17 MED ORDER — ONDANSETRON HCL 4 MG/2ML IJ SOLN
INTRAMUSCULAR | Status: DC | PRN
  Administered 2015-07-17: 4 mg via INTRAVENOUS

## 2015-07-17 MED ORDER — TECHNETIUM TC 99M SULFUR COLLOID FILTERED
1.0000 | Freq: Once | INTRAVENOUS | Status: AC | PRN
  Administered 2015-07-17: 1 via INTRADERMAL

## 2015-07-17 MED ORDER — FENTANYL CITRATE (PF) 250 MCG/5ML IJ SOLN
INTRAMUSCULAR | Status: AC
  Filled 2015-07-17: qty 5

## 2015-07-17 MED ORDER — CEFAZOLIN SODIUM-DEXTROSE 2-3 GM-% IV SOLR
INTRAVENOUS | Status: DC | PRN
  Administered 2015-07-17: 2 g via INTRAVENOUS

## 2015-07-17 MED ORDER — HYDROCODONE-ACETAMINOPHEN 5-325 MG PO TABS: 1.0000 | ORAL_TABLET | Freq: Four times a day (QID) | ORAL | Status: DC | PRN

## 2015-07-17 MED ORDER — PROPOFOL 10 MG/ML IV BOLUS
INTRAVENOUS | Status: DC | PRN
  Administered 2015-07-17: 150 mg via INTRAVENOUS

## 2015-07-17 MED ORDER — PROPOFOL 10 MG/ML IV BOLUS
INTRAVENOUS | Status: AC
  Filled 2015-07-17: qty 40

## 2015-07-17 MED ORDER — LABETALOL HCL 5 MG/ML IV SOLN
INTRAVENOUS | Status: AC
  Filled 2015-07-17: qty 4

## 2015-07-17 MED ORDER — CHLORHEXIDINE GLUCONATE 4 % EX LIQD: 1.0000 "application " | Freq: Once | CUTANEOUS | Status: DC

## 2015-07-17 MED ORDER — FENTANYL CITRATE (PF) 100 MCG/2ML IJ SOLN: 25.0000 ug | INTRAMUSCULAR | Status: DC | PRN

## 2015-07-17 MED ORDER — ONDANSETRON HCL 4 MG/2ML IJ SOLN: 4.0000 mg | Freq: Once | INTRAMUSCULAR | Status: DC | PRN

## 2015-07-17 SURGICAL SUPPLY — 53 items
APPLIER CLIP 9.375 MED OPEN (MISCELLANEOUS) ×3
APR CLP MED 9.3 20 MLT OPN (MISCELLANEOUS) ×1
BINDER BREAST LRG (GAUZE/BANDAGES/DRESSINGS) IMPLANT
BINDER BREAST XLRG (GAUZE/BANDAGES/DRESSINGS) IMPLANT
BLADE SURG 15 STRL LF DISP TIS (BLADE) ×1 IMPLANT
BLADE SURG 15 STRL SS (BLADE) ×3
CANISTER SUCTION 2500CC (MISCELLANEOUS) ×3 IMPLANT
CHLORAPREP W/TINT 26ML (MISCELLANEOUS) ×3 IMPLANT
CLIP APPLIE 9.375 MED OPEN (MISCELLANEOUS) ×1 IMPLANT
CONT SPEC 4OZ CLIKSEAL STRL BL (MISCELLANEOUS) ×3 IMPLANT
COVER PROBE W GEL 5X96 (DRAPES) ×3 IMPLANT
COVER SURGICAL LIGHT HANDLE (MISCELLANEOUS) ×3 IMPLANT
DEVICE DUBIN SPECIMEN MAMMOGRA (MISCELLANEOUS) ×3 IMPLANT
DRAPE CHEST BREAST 15X10 FENES (DRAPES) ×3 IMPLANT
DRAPE UTILITY XL STRL (DRAPES) ×3 IMPLANT
ELECT CAUTERY BLADE 6.4 (BLADE) ×3 IMPLANT
ELECT REM PT RETURN 9FT ADLT (ELECTROSURGICAL) ×3
ELECTRODE REM PT RTRN 9FT ADLT (ELECTROSURGICAL) ×1 IMPLANT
GLOVE BIO SURGEON STRL SZ8 (GLOVE) ×6 IMPLANT
GLOVE BIOGEL PI IND STRL 7.0 (GLOVE) IMPLANT
GLOVE BIOGEL PI IND STRL 8 (GLOVE) ×1 IMPLANT
GLOVE BIOGEL PI INDICATOR 7.0 (GLOVE) ×4
GLOVE BIOGEL PI INDICATOR 8 (GLOVE) ×2
GLOVE SURG SS PI 7.0 STRL IVOR (GLOVE) ×4 IMPLANT
GOWN STRL REUS W/ TWL LRG LVL3 (GOWN DISPOSABLE) ×1 IMPLANT
GOWN STRL REUS W/ TWL XL LVL3 (GOWN DISPOSABLE) ×1 IMPLANT
GOWN STRL REUS W/TWL LRG LVL3 (GOWN DISPOSABLE) ×9
GOWN STRL REUS W/TWL XL LVL3 (GOWN DISPOSABLE) ×3
KIT BASIN OR (CUSTOM PROCEDURE TRAY) ×3 IMPLANT
KIT MARKER MARGIN INK (KITS) ×3 IMPLANT
LIQUID BAND (GAUZE/BANDAGES/DRESSINGS) ×2 IMPLANT
NDL HYPO 25GX1X1/2 BEV (NEEDLE) ×1 IMPLANT
NDL SAFETY ECLIPSE 18X1.5 (NEEDLE) IMPLANT
NEEDLE HYPO 18GX1.5 SHARP (NEEDLE)
NEEDLE HYPO 25GX1X1/2 BEV (NEEDLE) ×3 IMPLANT
NS IRRIG 1000ML POUR BTL (IV SOLUTION) ×2 IMPLANT
PACK SURGICAL SETUP 50X90 (CUSTOM PROCEDURE TRAY) ×3 IMPLANT
PENCIL BUTTON HOLSTER BLD 10FT (ELECTRODE) ×3 IMPLANT
SPONGE GAUZE 4X4 12PLY STER LF (GAUZE/BANDAGES/DRESSINGS) ×2 IMPLANT
SPONGE LAP 18X18 X RAY DECT (DISPOSABLE) ×3 IMPLANT
SUT MNCRL AB 4-0 PS2 18 (SUTURE) ×6 IMPLANT
SUT SILK 2 0 SH (SUTURE) IMPLANT
SUT VIC AB 2-0 SH 27 (SUTURE) ×6
SUT VIC AB 2-0 SH 27XBRD (SUTURE) ×2 IMPLANT
SUT VIC AB 3-0 SH 27 (SUTURE) ×6
SUT VIC AB 3-0 SH 27X BRD (SUTURE) ×2 IMPLANT
SYR BULB 3OZ (MISCELLANEOUS) ×3 IMPLANT
SYR CONTROL 10ML LL (SYRINGE) ×3 IMPLANT
TOWEL OR 17X24 6PK STRL BLUE (TOWEL DISPOSABLE) ×3 IMPLANT
TOWEL OR 17X26 10 PK STRL BLUE (TOWEL DISPOSABLE) ×3 IMPLANT
TUBE CONNECTING 12'X1/4 (SUCTIONS) ×1
TUBE CONNECTING 12X1/4 (SUCTIONS) ×2 IMPLANT
YANKAUER SUCT BULB TIP NO VENT (SUCTIONS) ×3 IMPLANT

## 2015-07-17 NOTE — Anesthesia Preprocedure Evaluation (Addendum)
Anesthesia Evaluation  Patient identified by MRN, date of birth, ID band Patient awake    Reviewed: Allergy & Precautions, NPO status , Patient's Chart, lab work & pertinent test results  History of Anesthesia Complications Negative for: history of anesthetic complications  Airway Mallampati: II  TM Distance: >3 FB Neck ROM: Full    Dental no notable dental hx. (+) Dental Advisory Given   Pulmonary    Pulmonary exam normal breath sounds clear to auscultation       Cardiovascular hypertension, Pt. on medications + CAD  Normal cardiovascular exam Rhythm:Regular Rate:Normal     Neuro/Psych CVA negative psych ROS   GI/Hepatic Neg liver ROS, GERD  Medicated and Controlled,  Endo/Other  diabetes, Poorly Controlled, Type 2HgbA1C is 10  Renal/GU negative Renal ROS  negative genitourinary   Musculoskeletal  (+) Arthritis ,   Abdominal   Peds negative pediatric ROS (+)  Hematology negative hematology ROS (+)   Anesthesia Other Findings NPO appropriate, denies any active symptoms of angina with exertion or heart failure  Reproductive/Obstetrics negative OB ROS                           Anesthesia Physical Anesthesia Plan  ASA: III  Anesthesia Plan: General   Post-op Pain Management: GA combined w/ Regional for post-op pain   Induction: Intravenous  Airway Management Planned: LMA  Additional Equipment:   Intra-op Plan:   Post-operative Plan: Extubation in OR  Informed Consent: I have reviewed the patients History and Physical, chart, labs and discussed the procedure including the risks, benefits and alternatives for the proposed anesthesia with the patient or authorized representative who has indicated his/her understanding and acceptance.   Dental advisory given  Plan Discussed with: CRNA  Anesthesia Plan Comments: (LMA with PECS II block)       Anesthesia Quick Evaluation

## 2015-07-17 NOTE — H&P (Signed)
H&P   Glenda [name] (MR# [id number])      H&P Info    Author Note Status Last Update User Last Update Date/Time   Erroll Luna, MD Signed Erroll Luna, MD     H&P    Expand All Collapse All   atherine [name] Flatten  Location: [facility] Surgery Patient #: [id number] DOB: 1934/01/23 Undefined / Language: Glenda [name] / Race: Black or African American Female  History of Present Illness  Patient words: patient sent at the request of Dr. Sondra Come for right breast DCIS. Patient de or change in either breast.pain, nipple discharge or change in either breast. She undersymmetric densitymammogram which showed a 1.4 cm asymmetric density right breast upper outer quadrant. Core biopsy was done which shows high-grade DCIS 1.4 cm with possible microinvasion. This is ER negative and PR negative. Patient denies any other complaints. She has had a stroke in the past and d.es have some mild right arm weakness she tells me. Her family is with her.   CLINICAL DATA: Screening recall for a possible mass in the upper outer right breast.  EXAM: DIGITAL DIAGNOSTIC RIGHT MAMMOGRAM WITH 3D TOMOSYNTHESIS WITH CAD  ULTRASOUND RIGHT BREAST  COMPARISON: Screening study, 04/01/2015. There are no available exams.  ACR Breast Density Category c: The breast tissue is heterogeneously dense, which may obscure small masses.  FINDINGS: Focal increased opacity the upper outer right breast persists on the 2D and 3D tomosynthesis images. On the CC tomosynthesis image, there is a partly circumscribed oval mildly lobulated mass in the posterior third of the upper-outer quadrant of the right breast. There are no other discrete masses.  Mammographic images were processed with CAD.  On physical exam, there is a firm, small, mobile mass in the lateral right breast.  Targeted ultrasound is performed, showing a hypoechoic oval mass with lobulated partly ill-defined margins in the 9:30 o'clock position, 5 cm the  nipple, measuring 13 x 8 x 14 mm. There are no other discrete masses in the right breast. There are no enlarged or abnormal appearing axillary lymph nodes.  IMPRESSION: Suspicious mass in the 9:30 o'clock position of the right breast. Biopsy is recommended.  RECOMMENDATION: Ultrasound-guided core needle biopsy.  I have discussed the findings and recommendations with the patient. Results were also provided in writing at the conclusion of the visit. If applicable, a reminder letter will be sent to the patient regarding the next appointment.  BI-RADS CATEGORY 4: Suspicious.   Electronically Signed By: Lajean Manes M.D.       ADDITIONAL INFORMATION: PROGNOSTIC INDICATORS Results: IMMUNOHISTOCHEMICAL AND MORPHOMETRIC ANALYSIS PERFORMED MANUALLY Estrogen Receptor: 0%, NEGATIVE Progesterone Receptor: 0%, NEGATIVE COMMENT: The negative hormone receptor study(ies) in this case has an internal positive control. REFERENCE RANGE ESTROGEN RECEPTOR NEGATIVE 0% POSITIVE =>1% REFERENCE RANGE PROGESTERONE RECEPTOR NEGATIVE 0% POSITIVE =>1% All controls stained appropriately Enid Cutter MD Pathologist, Electronic Signature ( Signed 05/01/2015) FINAL DIAGNOSIS Diagnosis Breast, right, needle core biopsy, 9:30 o'clock - DUCTAL CARCINOMA IN SITU. - SEE MICROSCOPIC DESCRIPTION. Microscopic Comment The core biopsies show ductal carcinoma in situ with necrosis. There are foci of DCIS which have irregular contours and 1 of 2 FINAL for Glenda, P[place] [phone]) Microscopic Comment(continued) immunohistochemistry shows positive basal cell staining with smooth muscle myosin, p63 and cytokeratin 903; however, focal, microscopic invasion cannot be ruled out. The case is discussed with Dr. Glennon Mac on 04/25/15. (JDP:ds 04/29/15) [name] MD Pathologist, Electronic Signature (Case signed 04/29/2015) Specimen Gross and Clinical Information Specimen Comment In formalin 11:45,  extracted less  than 3 min; right breast mass on mammo and [place] Specimen(s) Obtained: Breast, right, needle core biopsy, 9:30 o'clock Specimen Clinical Information [facility] favored Gross Received in formalin are three soft pink white tissue.  The patient is a 82 year old female.   Other Problems  Arthritis Back Pain Breast Cancer Cerebrovascular Accident Cholelithiasis Diabetes Mellitus Gastroesophageal Reflux Disease High blood pressure Hypercholesterolemia Lump In Breast Pulmonary Embolism / Blood Clot in Legs  Past Surgical History  Appendectomy Breast Biopsy Right. Cataract Surgery Bilateral. Gallbladder Surgery - Open Oral Surgery  Diagnostic Studies History  Colonoscopy 5-10 years ago Mammogram within last year Pap Smear >5 years ago  Medication History  No Current Medications Medications Reconciled  Social History  Caffeine use Carbonated beverages. No alcohol use No drug use Tobacco use Never smoker.  Family History Arthritis Mother. Breast Cancer Sister. Colon Cancer Father. Depression Sister. Diabetes Mellitus Brother, Mother. Heart Disease Brother, Sister. Heart disease in female family member before age 47 Heart disease in female family member before age 51 Hypertension Mother. Malignant Neoplasm Of Pancreas Family Members In General.  Pregnancy / Birth History  Age at menarche 5 years. Age of menopause <47 Gravida 8 Irregular periods Maternal age 32-25 Para 5     Review of Systems  General Present- Appetite Loss. Not Present- Chills, Fatigue, Fever, Night Sweats, Weight Gain and Weight Loss. Skin Not Present- Change in Wart/Mole, Dryness, Hives, Jaundice, New Lesions, Non-Healing Wounds, Rash and Ulcer. HEENT Present- Earache, Hearing Loss, Ringing in the Ears and Wears glasses/contact lenses. Not Present- Hoarseness, Nose Bleed, Oral Ulcers, Seasonal Allergies, Sinus Pain, Sore Throat, Visual  Disturbances and Yellow Eyes. Respiratory Present- Snoring. Not Present- Bloody sputum, Chronic Cough, Difficulty Breathing and Wheezing. Breast Not Present- Breast Mass, Breast Pain, Nipple Discharge and Skin Changes. Cardiovascular Present- Leg Cramps and Swelling of Extremities. Not Present- Chest Pain, Difficulty Breathing Lying Down, Palpitations, Rapid Heart Rate and Shortness of Breath. Gastrointestinal Present- Chronic diarrhea, Difficulty Swallowing, Excessive gas, Gets full quickly at meals, Hemorrhoids, Indigestion and Vomiting. Not Present- Abdominal Pain, Bloating, Bloody Stool, Change in Bowel Habits, Constipation, Nausea and Rectal Pain. Female Genitourinary Not Present- Frequency, Nocturia, Painful Urination, Pelvic Pain and Urgency. Musculoskeletal Present- Back Pain, Muscle Pain and Muscle Weakness. Not Present- Joint Pain, Joint Stiffness and Swelling of Extremities. Neurological Present- Trouble walking and Weakness. Not Present- Decreased Memory, Fainting, Headaches, Numbness, Seizures, Tingling and Tremor. Psychiatric Not Present- Anxiety, Bipolar, Change in Sleep Pattern, Depression, Fearful and Frequent crying. Endocrine Not Present- Cold Intolerance, Excessive Hunger, Hair Changes, Heat Intolerance, Hot flashes and New Diabetes.   Physical Exam   General Mental Status-Alert. General Appearance-Consistent with stated age. Hydration-Well hydrated. Voice-Normal.  Head and Neck Head-normocephalic, atraumatic with no lesions or palpable masses. Trachea-midline. Thyroid Gland Characteristics - normal size and consistency.  Chest and Lung Exam Chest and lung exam reveals -quiet, even and easy respiratory effort with no use of accessory muscles and on auscultation, normal breath sounds, no adventitious sounds and normal vocal resonance. Inspection Chest Wall - Normal. Back - normal.  Breast Breast - Left-Symmetric, Non Tender, No Biopsy scars, no  Dimpling, No Inflammation, No Lumpectomy scars, No Mastectomy scars, No Peau d' Orange. Breast - Right-Symmetric, Non Tender, No Biopsy scars, no Dimpling, No Inflammation, No Lumpectomy scars, No Mastectomy scars, No Peau d' Orange. Breast Lump-No Palpable Breast Mass.  Cardiovascular Cardiovascular examination reveals -normal heart sounds, regular rate and rhythm with no murmurs and normal pedal pulses bilaterally.  Neurologic Note: mild  right arm weakness for bicep and tricep strength. mild weakness with abduction right arm  Musculoskeletal Normal Exam - Left-Upper Extremity Strength Normal and Lower Extremity Strength Normal. Normal Exam - Right-Upper Extremity Strength Normal and Lower Extremity Strength Normal.  Lymphatic Head & Neck  General Head & Neck Lymphatics: Bilateral - Description - Normal. Axillary  General Axillary Region: Bilateral - Description - Normal. Tenderness - Non Tender.    Assessment & Plan   BREAST NEOPLASM, TIS (DCIS), RIGHT (D05.11) Impression: the patient desires breast conservation. She does have high-grade DCIS noted the possibility of microinvasion. I have discussed mastectomy with reconstruction with her as well. She would like to proceed with right breast seed localized lumpectomy and sentinel lymph node mapping due to her possibility of microinvasion. Risk of lumpectomy include bleeding, infection, seroma, more surgery, use of seed/wire, wound care, cosmetic deformity and the need for other treatments, death , blood clots, death. Pt agrees to proceed. Risk of sentinel lymph node mapping include bleeding, infection, lymphedema, shoulder pain. stiffness, dye allergy. cosmetic deformity , blood clots, death, need for more surgery. Pt agres to proceed.  Current Plans You are being scheduled for surgery - Our schedulers will call you.  You should hear from our office's scheduling department within 5 working days about the location, date, and  time of surgery. We try to make accommodations for patient's preferences in scheduling surgery, but sometimes the OR schedule or the surgeon's schedule prevents [place] from making those accommodations.  If you have not heard from our office [phone]) in 5 working days, call the office and ask for your surgeon's nurse.  If you have other questions about your diagnosis, plan, or surgery, call the office and ask for your surgeon's nurse.  Pt Education - [facility] Breast Cancer Information Given - [employer] "Breast Journey" Package Pt Education - Pamphlet Given - Breast Biopsy: discussed with patient and provided information. We discussed the staging and pathophysiology of breast cancer. We discussed all of the different options for treatment for breast cancer including surgery, chemotherapy, radiation therapy, Herceptin, and antiestrogen therapy. We discussed a sentinel lymph node biopsy as she does not appear to having lymph node involvement right now. We discussed the performance of that with injection of radioactive tracer and blue dye. We discussed that she would have an incision underneath her axillary hairline. We discussed that there is a bout a 10-20% chance of having a positive node with a sentinel lymph node biopsy and we will await the permanent pathology to make any other first further decisions in terms of her treatment. One of these options might be to return to the operating room to perform an axillary lymph node dissection. We discussed about a 1-2% risk lifetime of chronic shoulder pain as well as lymphedema associated with a sentinel lymph node biopsy. We discussed the options for treatment of the breast cancer which included lumpectomy versus a mastectomy. We discussed the performance of the lumpectomy with a wire placement. We discussed a 10-20% chance of a positive margin requiring reexcision in the operating room. We also discussed that she may need radiation therapy or antiestrogen therapy or  both if she undergoes lumpectomy. We discussed the mastectomy and the postoperative care for that as well. We discussed that there is no difference in her survival whether she undergoes lumpectomy with radiation therapy or antiestrogen therapy versus a mastectomy. There is a slight difference in the local recurrence rate being 3-5% with lumpectomy and about 1% with a mastectomy. We  discussed the risks of operation including bleeding, infection, possible reoperation. She understands her further therapy will be based on what her stages at the time of her operation.  Pt Education - [facility] Breast Biopsy HCI: discussed with patient and provided information. Pt Education - ABC (After Breast Cancer) Class Info: discussed with patient and provided information.

## 2015-07-17 NOTE — Anesthesia Postprocedure Evaluation (Signed)
Anesthesia Post Note  Patient: [name]  Procedure(s) Performed: Procedure(s) (LRB): RADIOACTIVE SEED GUIDED PARTIAL MASTECTOMY WITH AXILLARY SENTINEL LYMPH NODE BIOPSY (Right)  Patient location during evaluation: PACU Anesthesia Type: General Level of consciousness: awake and alert, oriented and patient cooperative Pain management: pain level controlled Vital Signs Assessment: post-procedure vital signs reviewed and stable Respiratory status: spontaneous breathing, nonlabored ventilation and respiratory function stable Cardiovascular status: blood pressure returned to baseline and stable Postop Assessment: no signs of nausea or vomiting Anesthetic complications: no    Last Vitals:  Filed Vitals:   07/17/15 1323 07/17/15 1338  BP: 216/72 201/71  Pulse: 65 66  Temp:    Resp: 12 19    Last Pain:  Filed Vitals:   07/17/15 1517  PainSc: 0-No pain                 Jaena Brocato,E. Gertrude Bucks

## 2015-07-17 NOTE — Progress Notes (Signed)
Anesthesia follow-up: See note by Willeen Cass, NP.  Nurse Jeani Hawking just called from the breast center. Apparently, radioactive seed was not placed when initially scheduled due to elevated BP. It was placed this morning. However, on arrival SBP was > 200 (automatic). With relaxing techniques BP 160/80 and after the seed implant was 160/74. Apparently, patient is still quite nervous about her procedure, and this was felt to be a contributing factor. Jeani Hawking just wanted to let us know. We will check her BP on arrival.   George Hugh [facility] Stay [facility]/Anesthesiology Phone [phone] 07/17/2015 9:58 AM

## 2015-07-17 NOTE — Interval H&P Note (Signed)
History and Physical Interval Note:  07/17/2015 11:01 AM  Glenda [name]  has presented today for surgery, with the diagnosis of RIGHT BREAST DCIS  The various methods of treatment have been discussed with the patient and family. After consideration of risks, benefits and other options for treatment, the patient has consented to  Procedure(s): RADIOACTIVE SEED GUIDED PARTIAL MASTECTOMY WITH AXILLARY SENTINEL LYMPH NODE BIOPSY (Right) as a surgical intervention .  The patient's history has been reviewed, patient examined, no change in status, stable for surgery.  I have reviewed the patient's chart and labs.  Questions were answered to the patient's satisfaction.     [name]

## 2015-07-17 NOTE — Anesthesia Procedure Notes (Addendum)
Anesthesia Regional Block:  Pectoralis block  Pre-Anesthetic Checklist: ,, timeout performed, Correct Patient, Correct Site, Correct Laterality, Correct Procedure, Correct Position, site marked, Risks and benefits discussed,  Surgical consent,  Pre-op evaluation,  At surgeon's request and post-op pain management  Laterality: Right  Prep: chloraprep       Needles:   Needle Type: Echogenic Stimulator Needle     Needle Length: 5cm 5 cm Needle Gauge: 21 and 21 G    Additional Needles:  Procedures: ultrasound guided (picture in chart) Pectoralis block Narrative:  Injection made incrementally with aspirations every 5 mL.  Performed by: Personally  Anesthesiologist: JUDD, BENJAMIN  Additional Notes: Discussed risks of femoral nerve block including failure, bleeding, infection, nerve damage.  Femoral nerve block does not usually prevent all pain. Specifically, it treats the anterior, but often not the posterior knee. Questions answered.  Patient consents to block.   Procedure Name: LMA Insertion Date/Time: 07/17/2015 11:58 AM Performed by: Lance Coon Pre-anesthesia Checklist: Patient identified, Emergency Drugs available, Suction available, Patient being monitored and Timeout performed Patient Re-evaluated:Patient Re-evaluated prior to inductionOxygen Delivery Method: Circle system utilized Preoxygenation: Pre-oxygenation with 100% oxygen Intubation Type: IV induction Ventilation: Mask ventilation without difficulty LMA: LMA inserted LMA Size: 4.0 Number of attempts: 1 Placement Confirmation: positive ETCO2 and breath sounds checked- equal and bilateral Tube secured with: Tape

## 2015-07-17 NOTE — Op Note (Signed)
Preoperative diagnosis: Right breast cancer  Postoperative diagnosis: Same  Procedure: Right breast seed localized partial mastectomy with right axillary sentinel lymph node mapping  Surgeon: Erroll Luna M.D.  Anesthesia: LMA with 0.25% Sensorcaine local and regional block  EBL: Minimal  Specimens: Right breast mass was seizing clip in specimen with additional superior margin and 1 axillary sentinel node  Drains: None  Indications for procedure: The patient presents for right breast lumpectomy and sentinel lymph node mapping for stage I right breast cancer. She was diagnosed by core biopsy in desire breast conservation.The procedure has been discussed with the patient. Alternatives to surgery have been discussed with the patient.  Risks of surgery include bleeding,  Infection,  Seroma formation, death,  and the need for further surgery.   The patient understands and wishes to proceed.Sentinel lymph node mapping and dissection has been discussed with the patient.  Risk of bleeding,  Infection,  Seroma formation,  Additional procedures,,  Shoulder weakness ,  Shoulder stiffness,  Nerve and blood vessel injury and reaction to the mapping dyes have been discussed.  Alternatives to surgery have been discussed with the patient.  The patient agrees to proceed.  Description of procedure: The patient was met in the holding area and questions were answered. Neoprobe was used to identify the seed in  the right breast. She underwent injection  Of the right breast with technetium sulfur colloid. She was taken back to the operative room and placed upon the OR table. She had a block of a right pectoral region by anesthesia. After induction of elevated anesthesia the right breast was prepped and draped in sterile fashion. Timeout was done. She received preoperative antibiotics. Neoprobe was used to identify the location the seed in the right upper outer quadrant of the breast. Curvilinear incision was made and  all tissue around the seed clip were excised. Radiograph revealed the clip in the seed to be in the specimen. Hemostasis achieved. Cavity was clipped. Neoprobe was then used to identify the right axillary sentinel node. One node was identified and removed. Background counts approached 0. Who is irrigated. Hemostatic with cautery. His close the deep layer of 3-0 Vicryl and 4 Monocryl for the skin. Degrees of applied. Breast binder placed. All final counts are found to be correct. Patient awoke taken recovery extubated in satisfactory condition.

## 2015-07-17 NOTE — Discharge Instructions (Signed)
[facility] °Office Phone Number [phone] ° °BREAST BIOPSY/ PARTIAL MASTECTOMY: POST OP INSTRUCTIONS ° °Always review your discharge instruction sheet given to you by the facility where your surgery was performed. ° °IF YOU HAVE DISABILITY OR FAMILY LEAVE FORMS, YOU MUST BRING THEM TO THE OFFICE FOR PROCESSING.  DO NOT GIVE THEM TO YOUR DOCTOR. ° °1. A prescription for pain medication may be given to you upon discharge.  Take your pain medication as prescribed, if needed.  If narcotic pain medicine is not needed, then you may take acetaminophen (Tylenol) or ibuprofen (Advil) as needed. °2. Take your usually prescribed medications unless otherwise directed °3. If you need a refill on your pain medication, please contact your pharmacy.  They will contact our office to request authorization.  Prescriptions will not be filled after 5pm or on week-ends. °4. You should eat very light the first 24 hours after surgery, such as soup, crackers, pudding, etc.  Resume your normal diet the day after surgery. °5. Most patients will experience some swelling and bruising in the breast.  Ice packs and a good support bra will help.  Swelling and bruising can take several days to resolve.  °6. It is common to experience some constipation if taking pain medication after surgery.  Increasing fluid intake and taking a stool softener will usually help or prevent this problem from occurring.  A mild laxative (Milk of Magnesia or Miralax) should be taken according to package directions if there are no bowel movements after 48 hours. °7. Unless discharge instructions indicate otherwise, you may remove your bandages 24-48 hours after surgery, and you may shower at that time.  You may have steri-strips (small skin tapes) in place directly over the incision.  These strips should be left on the skin for 7-10 days.  If your surgeon used skin glue on the incision, you may shower in 24 hours.  The glue will flake off over the  next 2-3 weeks.  Any sutures or staples will be removed at the office during your follow-up visit. °8. ACTIVITIES:  You may resume regular daily activities (gradually increasing) beginning the next day.  Wearing a good support bra or sports bra minimizes pain and swelling.  You may have sexual intercourse when it is comfortable. °a. You may drive when you no longer are taking prescription pain medication, you can comfortably wear a seatbelt, and you can safely maneuver your car and apply brakes. °b. RETURN TO WORK:  ______________________________________________________________________________________ °9. You should see your doctor in the office for a follow-up appointment approximately two weeks after your surgery.  Your doctor’s nurse will typically make your follow-up appointment when she calls you with your pathology report.  Expect your pathology report 2-3 business days after your surgery.  You may call to check if you do not hear from us after three days. °10. OTHER INSTRUCTIONS: _______________________________________________________________________________________________ _____________________________________________________________________________________________________________________________________ °_____________________________________________________________________________________________________________________________________ °_____________________________________________________________________________________________________________________________________ ° °WHEN TO CALL YOUR DOCTOR: °1. Fever over 101.0 °2. Nausea and/or vomiting. °3. Extreme swelling or bruising. °4. Continued bleeding from incision. °5. Increased pain, redness, or drainage from the incision. ° °The clinic staff is available to answer your questions during regular business hours.  Please don’t hesitate to call and ask to speak to one of the nurses for clinical concerns.  If you have a medical emergency, go to the nearest  emergency room or call 911.  A surgeon from [facility] is always on call at the hospital. ° °For further questions, please visit [email or web address]  °

## 2015-07-17 NOTE — Transfer of Care (Signed)
Immediate Anesthesia Transfer of Care Note  Patient: Glenda [name]  Procedure(s) Performed: Procedure(s): RADIOACTIVE SEED GUIDED PARTIAL MASTECTOMY WITH AXILLARY SENTINEL LYMPH NODE BIOPSY (Right)  Patient Location: PACU  Anesthesia Type:General  Level of Consciousness: awake, alert , oriented and sedated  Airway & Oxygen Therapy: Patient Spontanous Breathing and Patient connected to nasal cannula oxygen  Post-op Assessment: Report given to RN, Post -op Vital signs reviewed and stable and Patient moving all extremities  Post vital signs: Reviewed and stable  Last Vitals:  Filed Vitals:   07/17/15 1100 07/17/15 1105  BP: 124/54 137/67  Pulse: 63 63  Temp:    Resp: 14 13    Complications: No apparent anesthesia complications

## 2015-07-18 ENCOUNTER — Encounter (HOSPITAL_COMMUNITY): Payer: Self-pay | Admitting: Surgery

## 2015-07-24 ENCOUNTER — Ambulatory Visit (HOSPITAL_BASED_OUTPATIENT_CLINIC_OR_DEPARTMENT_OTHER): Payer: Medicare Other | Admitting: Hematology and Oncology

## 2015-07-24 ENCOUNTER — Encounter: Payer: Self-pay | Admitting: Hematology and Oncology

## 2015-07-24 ENCOUNTER — Telehealth: Payer: Self-pay | Admitting: Hematology and Oncology

## 2015-07-24 VITALS — BP 196/60 | HR 64 | Temp 98.2°F | Resp 18 | Wt 152.2 lb

## 2015-07-24 DIAGNOSIS — C50411 Malignant neoplasm of upper-outer quadrant of right female breast: Secondary | ICD-10-CM

## 2015-07-24 NOTE — Progress Notes (Signed)
Unable to get in to exam room prior to MD.  No assessment performed.  

## 2015-07-24 NOTE — Progress Notes (Signed)
Patient Care Team: L.Donnie Coffin, MD as PCP - General (Family Medicine) Erroll Luna, MD as Consulting Physician (General Surgery) Nicholas Lose, MD as Consulting Physician (Hematology and Oncology) Gery Pray, MD as Consulting Physician (Radiation Oncology)  DIAGNOSIS: Breast cancer of upper-outer quadrant of right female breast [facility])   Staging form: Breast, AJCC 7th Edition     Clinical stage from 05/07/2015: Stage 0 (Tis (DCIS), N0, M0) - Unsigned       Staging comments: Staged at breast conference on 1.11.17    SUMMARY OF ONCOLOGIC HISTORY:   Breast cancer of upper-outer quadrant of right female breast ([place])   04/14/2015 Mammogram Right breast asymmetry at 9:30 position 1.3 x 0.8 x 1.4 cm   04/24/2015 Initial Diagnosis Right breast biopsy 9:30 position: DCIS high-grade with necrosis and calcifications,? Early invasive disease, ER 0% PR 0%   07/17/2015 Surgery Right lumpectomy: IDC grade 2, 0.3 cm, with high-grade DCIS with comedonecrosis, DCIS focally less than 0.1 cm from posterior margin and broadly 0.2 cm from posterior margin, 0/1 lymph node negative, T1 aN0 stage IA    CHIEF COMPLIANT: follow-up after recent lumpectomy to discuss pathology report  INTERVAL HISTORY: Glenda [name] is a 82 year old with above-mentioned history of right breast cancer treated with lumpectomy and is here to discuss the pathology report. Initial biopsy was DCIS but the final pathology showed a small focus of invasive ductal carcinoma with extensive high-grade DCIS with comedonecrosis. The margins were relatively close for DCIS. One lymph node is negative. She is recovering very well from the surgery.  REVIEW OF SYSTEMS:   Constitutional: Denies fevers, chills or abnormal weight loss Eyes: Denies blurriness of vision Ears, nose, mouth, throat, and face: Denies mucositis or sore throat Respiratory: Denies cough, dyspnea or wheezes Cardiovascular: Denies palpitation, chest  discomfort Gastrointestinal:  Denies nausea, heartburn or change in bowel habits Skin: Denies abnormal skin rashes Lymphatics: Denies new lymphadenopathy or easy bruising Neurological:Denies numbness, tingling or new weaknesses Behavioral/Psych: Mood is stable, no new changes  Extremities: No lower extremity edema Breast:  Discomfort from recent breast surgery All other systems were reviewed with the patient and are negative.  I have reviewed the past medical history, past surgical history, social history and family history with the patient and they are unchanged from previous note.  ALLERGIES:  has No Known Allergies.  MEDICATIONS:  Current Outpatient Prescriptions  Medication Sig Dispense Refill  . amLODipine (NORVASC) 10 MG tablet Take 10 mg by mouth daily.    Marland Kitchen aspirin 81 MG tablet Take 81 mg by mouth daily.    . clonazePAM (KLONOPIN) 0.5 MG tablet Take 0.25 mg by mouth 3 (three) times daily as needed for anxiety.    . cloNIDine (CATAPRES) 0.2 MG tablet Take 0.2 mg by mouth 2 (two) times daily.    . clopidogrel (PLAVIX) 75 MG tablet Take 75 mg by mouth daily.    . furosemide (LASIX) 80 MG tablet Take 80 mg by mouth daily.    Marland Kitchen HUMALOG KWIKPEN 100 UNIT/ML KiwkPen Inject 22-34 Units into the skin 2 (two) times daily. Sliding scale as follows: 80-199 = 22 units 200-299 = 26 units 300-399 = 30 units >400 = 34 units  3  . HYDROcodone-acetaminophen (NORCO) 5-325 MG tablet Take 1 tablet by mouth every 6 (six) hours as needed for moderate pain. 30 tablet 0  . ibuprofen (ADVIL,MOTRIN) 100 MG/5ML suspension Take 600 mg by mouth every 4 (four) hours as needed for moderate pain.    . Nebivolol  HCl (BYSTOLIC) 20 MG TABS Take 20 mg by mouth daily.    Marland Kitchen omeprazole (PRILOSEC) 20 MG capsule Take 20 mg by mouth every morning.     . potassium chloride (KLOR-CON) 10 MEQ CR tablet Take 10 mEq by mouth daily.      Nelva Nay SOLOSTAR 300 UNIT/ML SOPN Inject 24 Units into the skin every morning.   11  .  valsartan-hydrochlorothiazide (DIOVAN-HCT) 320-25 MG per tablet Take 1 tablet by mouth daily.     No current facility-administered medications for this visit.    PHYSICAL EXAMINATION: ECOG PERFORMANCE STATUS: 1 - Symptomatic but completely ambulatory  Filed Vitals:   07/24/15 0940  BP: 196/60  Pulse: 64  Temp: 98.2 F (36.8 C)  Resp: 18   Filed Weights   07/24/15 0940  Weight: 152 lb 3.2 oz (69.037 kg)    GENERAL:alert, no distress and comfortable SKIN: skin color, texture, turgor are normal, no rashes or significant lesions EYES: normal, Conjunctiva are pink and non-injected, sclera clear OROPHARYNX:no exudate, no erythema and lips, buccal mucosa, and tongue normal  NECK: supple, thyroid normal size, non-tender, without nodularity LYMPH:  no palpable lymphadenopathy in the cervical, axillary or inguinal LUNGS: clear to auscultation and percussion with normal breathing effort HEART: regular rate & rhythm and no murmurs and no lower extremity edema ABDOMEN:abdomen soft, non-tender and normal bowel sounds MUSCULOSKELETAL:no cyanosis of digits and no clubbing  NEURO: alert & oriented x 3 with fluent speech, no focal motor/sensory deficits EXTREMITIES: No lower extremity edema LABORATORY DATA:  I have reviewed the data as listed   Chemistry      Component Value Date/Time   NA 139 07/10/2015 1026   NA 141 05/07/2015 1251   K 4.5 07/10/2015 1026   K 4.3 05/07/2015 1251   CL 108 07/10/2015 1026   CO2 23 07/10/2015 1026   CO2 22 05/07/2015 1251   BUN 28* 07/10/2015 1026   BUN 22.5 05/07/2015 1251   CREATININE 1.65* 07/10/2015 1026   CREATININE 1.2* 05/07/2015 1251      Component Value Date/Time   CALCIUM 8.9 07/10/2015 1026   CALCIUM 9.1 05/07/2015 1251   ALKPHOS 79 05/22/2015 1100   ALKPHOS 95 05/07/2015 1251   AST 17 05/22/2015 1100   AST 15 05/07/2015 1251   ALT 10* 05/22/2015 1100   ALT <9 05/07/2015 1251   BILITOT 0.3 05/22/2015 1100   BILITOT 0.30 05/07/2015  1251       Lab Results  Component Value Date   WBC 4.2 07/10/2015   HGB 9.9* 07/10/2015   HCT 31.3* 07/10/2015   MCV 81.5 07/10/2015   PLT 189 07/10/2015   NEUTROABS 1.8 05/22/2015     ASSESSMENT & PLAN:  Breast cancer of upper-outer quadrant of right female breast ([place]) Right lumpectomy 07/17/2015: IDC grade 2, 0.3 cm, with high-grade DCIS with comedonecrosis, DCIS focally less than 0.1 cm from posterior margin and broadly 0.2 cm from posterior margin, 0/1 lymph node negative, T1 aN0 stage IA, Her -2 neg, ER/PR pending Original right breast biopsy was DCIS ER/PR negative  Pathology review: I discussed that the final pathology revealed a small focus of invasive breast cancer. Discussed the staging process as well as the previously done ER and PR receptors. I will request pathology to perform HER-2 testing. It would not change our treatment plan but it would be informative for prognostic value.  Recommendation: 1. ER and PR retesting pending. 2. Adjuvant radiation therapy  Return to clinic in 6 months  for follow-up for surveillance.   No orders of the defined types were placed in this encounter.   The patient has a good understanding of the overall plan. she agrees with it. she will call with any problems that may develop before the next visit here.   Rulon Eisenmenger, MD 07/24/2015

## 2015-07-24 NOTE — Telephone Encounter (Signed)
appt made and avs printed °

## 2015-07-24 NOTE — Assessment & Plan Note (Signed)
Right lumpectomy 07/17/2015: IDC grade 2, 0.3 cm, with high-grade DCIS with comedonecrosis, DCIS focally less than 0.1 cm from posterior margin and broadly 0.2 cm from posterior margin, 0/1 lymph node negative, T1 aN0 stage IA Original right breast biopsy was DCIS ER/PR negative  Pathology review: I discussed that the final pathology revealed a small focus of invasive breast cancer. Discussed the staging process as well as the previously done ER and PR receptors. I will request pathology to perform HER-2 testing. It would not change our treatment plan but it would be informative for prognostic value.  Recommendation: 1. Add HER-2 testing 2. Adjuvant radiation therapy  Return to clinic in 6 months for follow-up for surveillance.

## 2015-07-25 ENCOUNTER — Ambulatory Visit (INDEPENDENT_AMBULATORY_CARE_PROVIDER_SITE_OTHER): Payer: Medicare Other | Admitting: Ophthalmology

## 2015-07-25 NOTE — Progress Notes (Signed)
Location of Breast Cancer: Breast cancer of upper-outer quadrant of right female breast   Histology per Pathology Report:   07/17/15 Diagnosis 1. Breast, lumpectomy, Right - INVASIVE GRADE II CARCINOMA, SPANNING 0.3 CM. - BACKGROUND OF EXTENSIVE HIGH GRADE DUCTAL CARCINOMA IN SITU WITH COMEDONECROSIS. - DUCTAL CARCINOMA IN SITU IS FOCALLY LESS THAN 0.1 CM AWAY FROM POSTERIOR MARGIN AND IS BROADLY 0.2 CM AWAY FROM POSTERIOR MARGIN. - OTHER MARGINS ARE NEGATIVE. - SEE ONCOLOGY TEMPLATE. 2. Breast, excision, Additional Right Superior Margin - BENIGN BREAST PARENCHYMA. - NO ATYPIA, HYPERPLASIA, OR MALIGNANCY IDENTIFIED. 3. Lymph node, sentinel, biopsy, Right Axillary - ONE BENIGN LYMPH NODE WITH NO TUMOR SEEN (0/1).  04/24/15 Diagnosis Breast, right, needle core biopsy, 9:30 o'clock - DUCTAL CARCINOMA IN SITU. - SEE MICROSCOPIC DESCRIPTION.  Receptor Status: ER(0%), PR (0%), Her2-neu (negative), Ki-(5%)  Did patient present with symptoms (if so, please note symptoms) or was this found on screening mammography?: abnormal mammogram  Past/Anticipated interventions by surgeon, if any: 07/17/15 -Procedure: RADIOACTIVE SEED GUIDED PARTIAL MASTECTOMY WITH AXILLARY SENTINEL LYMPH NODE BIOPSY;  Surgeon: Erroll Luna, MD;  Location: [place];  Service: General;  Laterality: Right;   Past/Anticipated interventions by medical oncology, if any: none  Lymphedema issues, if any:  no    Pain issues, if any:  no   OB GYN history:Age at menarche 89 years. Age of menopause <47. Gravida 8. Irregular periods. Maternal age 26-25. Para 5.   SAFETY ISSUES:  Prior radiation? no  Pacemaker/ICD? no  Possible current pregnancy?no  Is the patient on methotrexate? no  Current Complaints / other details:  Patient is here with her daughter.  Her bp was elevated today at 206/55.  She said it is always high when she goes to the doctor.  BP 206/55 mmHg  Pulse 64  Temp(Src) 98.2 F (36.8 C) (Oral)   Ht '5\' 6"'  (1.676 m)  Wt 146 lb 14.4 oz (66.633 kg)  BMI 23.72 kg/m2

## 2015-07-31 ENCOUNTER — Ambulatory Visit
Admission: RE | Admit: 2015-07-31 | Discharge: 2015-07-31 | Disposition: A | Discharge status: Home / Self Care | Payer: Medicare Other | Source: Ambulatory Visit | Attending: Radiation Oncology | Admitting: Radiation Oncology

## 2015-07-31 ENCOUNTER — Encounter: Payer: Self-pay | Admitting: Radiation Oncology

## 2015-07-31 VITALS — BP 206/55 | HR 64 | Temp 98.2°F | Ht 66.0 in | Wt 146.9 lb

## 2015-07-31 DIAGNOSIS — C50411 Malignant neoplasm of upper-outer quadrant of right female breast: Secondary | ICD-10-CM

## 2015-07-31 DIAGNOSIS — Z171 Estrogen receptor negative status [ER-]: Secondary | ICD-10-CM | POA: Diagnosis not present

## 2015-07-31 DIAGNOSIS — Z510 Encounter for antineoplastic radiation therapy: Secondary | ICD-10-CM | POA: Diagnosis present

## 2015-07-31 DIAGNOSIS — Z9889 Other specified postprocedural states: Secondary | ICD-10-CM | POA: Diagnosis not present

## 2015-07-31 NOTE — Progress Notes (Signed)
Please see the Nurse Progress Note in the MD Initial Consult Encounter for this patient. 

## 2015-07-31 NOTE — Progress Notes (Signed)
Radiation Oncology         [phone]) [phone] ________________________________  Name: Glenda [name] MRN: [id number]  Date: 07/31/2015  DOB: [birth year]     Re-evaluation Note  CC: Donnie Coffin, MD  Nicholas Lose, MD    ICD-9-CM ICD-10-CM   1. Breast cancer of upper-outer quadrant of right female breast ([place]) 174.4 C50.411     Diagnosis: (Stage T1 aN0) Invasive ductal carcinoma with DCIS of the right breast ER/PR negative, s/p lumpectomy and sentinel node procedure    Breast cancer of upper-outer quadrant of right female breast ([place])   04/14/2015 Mammogram Right breast asymmetry at 9:30 position 1.3 x 0.8 x 1.4 cm   04/24/2015 Initial Diagnosis Right breast biopsy 9:30 position: DCIS high-grade with necrosis and calcifications,? Early invasive disease, ER 0% PR 0%   07/17/2015 Surgery Right lumpectomy: IDC grade 2, 0.3 cm, with high-grade DCIS with comedonecrosis, DCIS focally less than 0.1 cm from posterior margin and broadly 0.2 cm from posterior margin, 0/1 lymph node negative, T1 aN0 stage IA    Narrative:  The patient returns today for post-operative follow-up.The patient has successfully undergone right breast lumpectomy. The patient was initially seen in breast clinic and was  diagnosed with high grade DCIS but the final pathology indicated a grade II IDC, measuring 0.3 cm, with high-grade DCIS with comedonecrosis. DCIS was focally less than 0.1 cm from the posterior margin and broadly 0.2 cm from the posterior margin. 0/1 lymph node negative. The patient presents today to discuss adjuvant radiation treatment in a post-operative setting.    On today's visit, the patient states she is doing well. She is not taking any pain medications.                           ALLERGIES:  has No Known Allergies.  Meds: Current Outpatient Prescriptions  Medication Sig Dispense Refill  . amLODipine (NORVASC) 10 MG tablet Take 10 mg by mouth daily.    Marland Kitchen aspirin 81 MG tablet Take 81 mg by mouth  daily.    . clonazePAM (KLONOPIN) 0.5 MG tablet Take 0.25 mg by mouth 3 (three) times daily as needed for anxiety.    . cloNIDine (CATAPRES) 0.2 MG tablet Take 0.2 mg by mouth 2 (two) times daily.    . clopidogrel (PLAVIX) 75 MG tablet Take 75 mg by mouth daily.    . furosemide (LASIX) 80 MG tablet Take 80 mg by mouth daily.    Marland Kitchen HUMALOG KWIKPEN 100 UNIT/ML KiwkPen Inject 22-34 Units into the skin 2 (two) times daily. Sliding scale as follows: 80-199 = 22 units 200-299 = 26 units 300-399 = 30 units >400 = 34 units  3  . HYDROcodone-acetaminophen (NORCO) 5-325 MG tablet Take 1 tablet by mouth every 6 (six) hours as needed for moderate pain. 30 tablet 0  . ibuprofen (ADVIL,MOTRIN) 100 MG/5ML suspension Take 600 mg by mouth every 4 (four) hours as needed for moderate pain.    . Nebivolol HCl (BYSTOLIC) 20 MG TABS Take 20 mg by mouth daily.    Marland Kitchen omeprazole (PRILOSEC) 20 MG capsule Take 20 mg by mouth every morning.     . potassium chloride (KLOR-CON) 10 MEQ CR tablet Take 10 mEq by mouth daily.      Nelva Nay SOLOSTAR 300 UNIT/ML SOPN Inject 24 Units into the skin every morning.   11  . valsartan-hydrochlorothiazide (DIOVAN-HCT) 320-25 MG per tablet Take 1 tablet by mouth daily.  No current facility-administered medications for this encounter.    Physical Findings: The patient is in no acute distress. Patient is alert and oriented.  height is '5\' 6"'  (1.676 m) and weight is 146 lb 14.4 oz (66.633 kg). Her oral temperature is 98.2 F (36.8 C). Her blood pressure is 206/55 and her pulse is 64. .  No significant changes. No palpable cervical, supraclavicular or axillary lymphoadenopathy. The heart has a regular rate and rhythm. The lungs are clear to auscultation.   Breast: Right breast has a well healing scar in the 10 o'clock position. No identifiable sentinel node scar. No palpable mass appreciated, no nipple discharge or bleeding. Mild induration at surgical site consistent with surgery.    Lab Findings: Lab Results  Component Value Date   WBC 4.2 07/10/2015   HGB 9.9* 07/10/2015   HCT 31.3* 07/10/2015   MCV 81.5 07/10/2015   PLT 189 07/10/2015    Radiographic Findings: Nm Sentinel Node Inj-no Rpt (breast)  07/17/2015  CLINICAL DATA: RIGHT BREAST dcis Sulfur colloid was injected intradermally by the nuclear medicine technologist for breast cancer sentinel node localization.   Mm Breast Surgical Specimen  07/17/2015  CLINICAL DATA:  Biopsy proven DCIS in the upper outer quadrant of the right breast, right lumpectomy. Radioactive seed localization was performed earlier today. EXAM: SPECIMEN RADIOGRAPH OF THE RIGHT BREAST COMPARISON:  Previous exam(s). FINDINGS: Status post excision of the right breast. The radioactive seed and ribbon shaped biopsy marker clip are present in the specimen, completely intact, and were marked for pathology. Results were discussed by telephone with the operating room nurse at the time of interpretation 07/17/2015 at 12:25 p.m. IMPRESSION: Specimen radiograph of the right breast. Electronically Signed   By: Evangeline Dakin M.D.   On: 07/17/2015 12:28   Mm Rt Radioactive Seed Loc Mammo Guide  07/17/2015  CLINICAL DATA:  82 year old with biopsy-proven DCIS in the upper outer quadrant of the right breast. Radioactive seed localization prior to right breast lumpectomy which is scheduled for later today. EXAM: MAMMOGRAPHIC GUIDED RADIOACTIVE SEED LOCALIZATION OF THE RIGHT BREAST COMPARISON:  Previous exams. FINDINGS: Patient presents for radioactive seed localization prior to right breast lumpectomy. I met with the patient and we discussed the procedure of seed localization including benefits and alternatives. We discussed the high likelihood of a successful procedure. We discussed the risks of the procedure including infection, bleeding, tissue injury and further surgery. We discussed the low dose of radioactivity involved in the procedure. Informed, written  consent was given. The usual time-out protocol was performed immediately prior to the procedure. Using mammographic guidance, sterile technique, 1% lidocaine as local anesthetic, and an I-125 radioactive seed, the ribbon shaped tissue marker clip in the upper outer right breast was localized using a lateral approach. The follow-up mammogram images confirm the seed in the expected location and were marked for Dr. Brantley Stage. Follow-up survey of the patient confirms the presence of the radioactive seed. Order number of I-125 seed:  [id number] Total activity: 0.252 mCi  Reference Date: 07/02/2015 The patient tolerated the procedure well and was released from the [place]. She was given instructions regarding seed removal. IMPRESSION: Radioactive seed localization of the right breast. No apparent complications. Electronically Signed   By: Evangeline Dakin M.D.   On: 07/17/2015 09:57   Impression: The patient has been diagnosed with stage T1a N0 invasive ductal carcinoma grade II with DCIS of the right breast s/p lumpectomy. The patient is recovering well from her lumpectomy and is appropriate  to begin adjuvant radiation treatment to the right breast. She is not a candidate for adjuvant hormonal therapy since her receptors were negative. She will receive a boost to the lumpectomy cavity given the close DCIS margin.  Plan: I spoke to the patient today regarding her diagnosis and options for treatment.  We discussed the role of radiation in decreasing local failures in patients who undergo lumpectomy. We discussed the process of simulation and the placement tattoos. We discussed 4-6 weeks of treatment as an outpatient.  We discussed the low likelihood of secondary malignancies. We discussed the possible side effects including but not limited to skin redness, fatigue, permanent skin darkening, and breast swelling. The patient has signed informed consent.   Simulation is scheduled for April 25th at  11am .-----------------------------------  Blair Promise, PhD, MD  This document serves as a record of services personally performed by Gery Pray, MD. It was created on his behalf by Derek Mound, a trained medical scribe. The creation of this record is based on the scribe's personal observations and the provider's statements to them. This document has been checked and approved by the attending provider.

## 2015-08-01 NOTE — Addendum Note (Signed)
Encounter addended by: Jacqulyn Liner, RN on: 08/01/2015  8:13 AM<BR>     Documentation filed: Charges VN

## 2015-08-15 ENCOUNTER — Ambulatory Visit (INDEPENDENT_AMBULATORY_CARE_PROVIDER_SITE_OTHER): Payer: Medicare Other | Admitting: Ophthalmology

## 2015-08-15 DIAGNOSIS — H35033 Hypertensive retinopathy, bilateral: Secondary | ICD-10-CM | POA: Diagnosis not present

## 2015-08-15 DIAGNOSIS — E113592 Type 2 diabetes mellitus with proliferative diabetic retinopathy without macular edema, left eye: Secondary | ICD-10-CM

## 2015-08-15 DIAGNOSIS — E113511 Type 2 diabetes mellitus with proliferative diabetic retinopathy with macular edema, right eye: Secondary | ICD-10-CM | POA: Diagnosis not present

## 2015-08-15 DIAGNOSIS — H43813 Vitreous degeneration, bilateral: Secondary | ICD-10-CM | POA: Diagnosis not present

## 2015-08-15 DIAGNOSIS — E11311 Type 2 diabetes mellitus with unspecified diabetic retinopathy with macular edema: Secondary | ICD-10-CM | POA: Diagnosis not present

## 2015-08-15 DIAGNOSIS — I10 Essential (primary) hypertension: Secondary | ICD-10-CM

## 2015-08-19 ENCOUNTER — Ambulatory Visit: Payer: Medicare Other | Admitting: Radiation Oncology

## 2015-08-21 ENCOUNTER — Ambulatory Visit
Admission: RE | Admit: 2015-08-21 | Discharge: 2015-08-21 | Disposition: A | Discharge status: Home / Self Care | Payer: Medicare Other | Source: Ambulatory Visit | Attending: Radiation Oncology | Admitting: Radiation Oncology

## 2015-08-21 DIAGNOSIS — C50411 Malignant neoplasm of upper-outer quadrant of right female breast: Secondary | ICD-10-CM

## 2015-08-21 NOTE — Progress Notes (Signed)
  Radiation Oncology         [phone]) [phone] ________________________________  Name: Glenda [name] MRN: [id number]  Date: 08/21/2015  DOB: 05/24/1933  SIMULATION AND TREATMENT PLANNING NOTE    ICD-9-CM ICD-10-CM   1. Breast cancer of upper-outer quadrant of right female breast ([place]) 174.4 C50.411     DIAGNOSIS:  (Stage T1 aN0) Invasive ductal carcinoma with DCIS of the right breast ER/PR negative, s/p lumpectomy and sentinel node procedure  NARRATIVE:  The patient was brought to the [facility].  Identity was confirmed.  All relevant records and images related to the planned course of therapy were reviewed.  The patient freely provided informed written consent to proceed with treatment after reviewing the details related to the planned course of therapy. The consent form was witnessed and verified by the simulation staff.  Then, the patient was set-up in a stable reproducible  supine position for radiation therapy.  CT images were obtained.  Surface markings were placed.  The CT images were loaded into the planning software.  Then the target and avoidance structures were contoured.  Treatment planning then occurred.  The radiation prescription was entered and confirmed.  Then, I designed and supervised the construction of a total of 3 medically necessary complex treatment devices.  I have requested : 3D Simulation  I have requested a DVH of the following structures: lumpectomy cavity, lungs, and heart.  I have ordered: dose calc.  PLAN:  The patient will receive 42.72 Gy in 16 fractions Followed by a boost to the lumpectomy cavity of 12 gray and a cumulative dose of 54.72 gray  ________________________________ -----------------------------------  Blair Promise, PhD, MD  This document serves as a record of services personally performed by Gery Pray, MD. It was created on his behalf by Darcus Austin, a trained medical scribe. The creation of this record is based on the  scribe's personal observations and the provider's statements to them. This document has been checked and approved by the attending provider.

## 2015-08-24 ENCOUNTER — Inpatient Hospital Stay (HOSPITAL_COMMUNITY): Payer: Medicare Other

## 2015-08-24 ENCOUNTER — Encounter (HOSPITAL_COMMUNITY): Payer: Self-pay | Admitting: Emergency Medicine

## 2015-08-24 ENCOUNTER — Inpatient Hospital Stay (HOSPITAL_COMMUNITY)
Admission: EM | Admit: 2015-08-24 | Discharge: 2015-08-28 | DRG: 065 | Disposition: A | Discharge status: Skilled Nursing Facility | Payer: Medicare Other | Attending: Internal Medicine | Admitting: Internal Medicine

## 2015-08-24 ENCOUNTER — Emergency Department (HOSPITAL_COMMUNITY): Payer: Medicare Other

## 2015-08-24 DIAGNOSIS — E1065 Type 1 diabetes mellitus with hyperglycemia: Secondary | ICD-10-CM | POA: Diagnosis present

## 2015-08-24 DIAGNOSIS — I6789 Other cerebrovascular disease: Secondary | ICD-10-CM | POA: Diagnosis not present

## 2015-08-24 DIAGNOSIS — I10 Essential (primary) hypertension: Secondary | ICD-10-CM | POA: Diagnosis not present

## 2015-08-24 DIAGNOSIS — H547 Unspecified visual loss: Secondary | ICD-10-CM | POA: Diagnosis present

## 2015-08-24 DIAGNOSIS — G8191 Hemiplegia, unspecified affecting right dominant side: Secondary | ICD-10-CM | POA: Diagnosis present

## 2015-08-24 DIAGNOSIS — R1110 Vomiting, unspecified: Secondary | ICD-10-CM

## 2015-08-24 DIAGNOSIS — I2510 Atherosclerotic heart disease of native coronary artery without angina pectoris: Secondary | ICD-10-CM | POA: Diagnosis present

## 2015-08-24 DIAGNOSIS — Z9011 Acquired absence of right breast and nipple: Secondary | ICD-10-CM

## 2015-08-24 DIAGNOSIS — R1310 Dysphagia, unspecified: Secondary | ICD-10-CM | POA: Diagnosis present

## 2015-08-24 DIAGNOSIS — N183 Chronic kidney disease, stage 3 (moderate): Secondary | ICD-10-CM | POA: Diagnosis present

## 2015-08-24 DIAGNOSIS — I69398 Other sequelae of cerebral infarction: Secondary | ICD-10-CM | POA: Insufficient documentation

## 2015-08-24 DIAGNOSIS — R270 Ataxia, unspecified: Secondary | ICD-10-CM | POA: Diagnosis present

## 2015-08-24 DIAGNOSIS — Z6825 Body mass index (BMI) 25.0-25.9, adult: Secondary | ICD-10-CM | POA: Diagnosis not present

## 2015-08-24 DIAGNOSIS — Z803 Family history of malignant neoplasm of breast: Secondary | ICD-10-CM | POA: Diagnosis not present

## 2015-08-24 DIAGNOSIS — E1022 Type 1 diabetes mellitus with diabetic chronic kidney disease: Secondary | ICD-10-CM | POA: Diagnosis present

## 2015-08-24 DIAGNOSIS — R29703 NIHSS score 3: Secondary | ICD-10-CM | POA: Diagnosis present

## 2015-08-24 DIAGNOSIS — K219 Gastro-esophageal reflux disease without esophagitis: Secondary | ICD-10-CM | POA: Diagnosis present

## 2015-08-24 DIAGNOSIS — Z833 Family history of diabetes mellitus: Secondary | ICD-10-CM | POA: Diagnosis not present

## 2015-08-24 DIAGNOSIS — D62 Acute posthemorrhagic anemia: Secondary | ICD-10-CM | POA: Insufficient documentation

## 2015-08-24 DIAGNOSIS — R001 Bradycardia, unspecified: Secondary | ICD-10-CM | POA: Diagnosis present

## 2015-08-24 DIAGNOSIS — I129 Hypertensive chronic kidney disease with stage 1 through stage 4 chronic kidney disease, or unspecified chronic kidney disease: Secondary | ICD-10-CM | POA: Diagnosis present

## 2015-08-24 DIAGNOSIS — I69391 Dysphagia following cerebral infarction: Secondary | ICD-10-CM | POA: Insufficient documentation

## 2015-08-24 DIAGNOSIS — E119 Type 2 diabetes mellitus without complications: Secondary | ICD-10-CM | POA: Diagnosis not present

## 2015-08-24 DIAGNOSIS — E669 Obesity, unspecified: Secondary | ICD-10-CM | POA: Diagnosis present

## 2015-08-24 DIAGNOSIS — I639 Cerebral infarction, unspecified: Principal | ICD-10-CM | POA: Diagnosis present

## 2015-08-24 DIAGNOSIS — M1990 Unspecified osteoarthritis, unspecified site: Secondary | ICD-10-CM | POA: Diagnosis present

## 2015-08-24 DIAGNOSIS — E785 Hyperlipidemia, unspecified: Secondary | ICD-10-CM | POA: Diagnosis present

## 2015-08-24 DIAGNOSIS — N179 Acute kidney failure, unspecified: Secondary | ICD-10-CM | POA: Insufficient documentation

## 2015-08-24 DIAGNOSIS — C50411 Malignant neoplasm of upper-outer quadrant of right female breast: Secondary | ICD-10-CM | POA: Diagnosis present

## 2015-08-24 DIAGNOSIS — R269 Unspecified abnormalities of gait and mobility: Secondary | ICD-10-CM | POA: Diagnosis not present

## 2015-08-24 DIAGNOSIS — Z9861 Coronary angioplasty status: Secondary | ICD-10-CM

## 2015-08-24 DIAGNOSIS — Z7982 Long term (current) use of aspirin: Secondary | ICD-10-CM

## 2015-08-24 DIAGNOSIS — Z794 Long term (current) use of insulin: Secondary | ICD-10-CM

## 2015-08-24 DIAGNOSIS — Z8673 Personal history of transient ischemic attack (TIA), and cerebral infarction without residual deficits: Secondary | ICD-10-CM

## 2015-08-24 DIAGNOSIS — Z9049 Acquired absence of other specified parts of digestive tract: Secondary | ICD-10-CM

## 2015-08-24 DIAGNOSIS — D638 Anemia in other chronic diseases classified elsewhere: Secondary | ICD-10-CM | POA: Diagnosis present

## 2015-08-24 DIAGNOSIS — Z7902 Long term (current) use of antithrombotics/antiplatelets: Secondary | ICD-10-CM | POA: Diagnosis not present

## 2015-08-24 DIAGNOSIS — Z853 Personal history of malignant neoplasm of breast: Secondary | ICD-10-CM | POA: Insufficient documentation

## 2015-08-24 DIAGNOSIS — IMO0002 Reserved for concepts with insufficient information to code with codable children: Secondary | ICD-10-CM | POA: Diagnosis present

## 2015-08-24 DIAGNOSIS — E108 Type 1 diabetes mellitus with unspecified complications: Secondary | ICD-10-CM | POA: Diagnosis not present

## 2015-08-24 DIAGNOSIS — I161 Hypertensive emergency: Secondary | ICD-10-CM | POA: Diagnosis present

## 2015-08-24 DIAGNOSIS — R42 Dizziness and giddiness: Secondary | ICD-10-CM | POA: Diagnosis present

## 2015-08-24 DIAGNOSIS — H9190 Unspecified hearing loss, unspecified ear: Secondary | ICD-10-CM | POA: Diagnosis present

## 2015-08-24 DIAGNOSIS — G8190 Hemiplegia, unspecified affecting unspecified side: Secondary | ICD-10-CM | POA: Insufficient documentation

## 2015-08-24 LAB — GLUCOSE, CAPILLARY
GLUCOSE-CAPILLARY: 209 mg/dL — AB (ref 65–99)
Glucose-Capillary: 277 mg/dL — ABNORMAL HIGH (ref 65–99)

## 2015-08-24 LAB — COMPREHENSIVE METABOLIC PANEL
ALT: 10 U/L — ABNORMAL LOW (ref 14–54)
ANION GAP: 9 (ref 5–15)
AST: 17 U/L (ref 15–41)
Albumin: 3.2 g/dL — ABNORMAL LOW (ref 3.5–5.0)
Alkaline Phosphatase: 106 U/L (ref 38–126)
BUN: 28 mg/dL — ABNORMAL HIGH (ref 6–20)
CHLORIDE: 111 mmol/L (ref 101–111)
CO2: 25 mmol/L (ref 22–32)
Calcium: 9.3 mg/dL (ref 8.9–10.3)
Creatinine, Ser: 1.42 mg/dL — ABNORMAL HIGH (ref 0.44–1.00)
GFR calc non Af Amer: 34 mL/min — ABNORMAL LOW (ref >60)
GFR, EST AFRICAN AMERICAN: 40 mL/min — AB (ref >60)
Glucose, Bld: 241 mg/dL — ABNORMAL HIGH (ref 65–99)
Potassium: 3.9 mmol/L (ref 3.5–5.1)
SODIUM: 145 mmol/L (ref 135–145)
Total Bilirubin: 0.4 mg/dL (ref 0.3–1.2)
Total Protein: 7.4 g/dL (ref 6.5–8.1)

## 2015-08-24 LAB — CBC
HCT: 35.6 % — ABNORMAL LOW (ref 36.0–46.0)
HEMOGLOBIN: 11.5 g/dL — AB (ref 12.0–15.0)
MCH: 26.4 pg (ref 26.0–34.0)
MCHC: 32.3 g/dL (ref 30.0–36.0)
MCV: 81.7 fL (ref 78.0–100.0)
Platelets: 241 10*3/uL (ref 150–400)
RBC: 4.36 MIL/uL (ref 3.87–5.11)
RDW: 14 % (ref 11.5–15.5)
WBC: 5.7 10*3/uL (ref 4.0–10.5)

## 2015-08-24 LAB — LIPASE, BLOOD: LIPASE: 35 U/L (ref 11–51)

## 2015-08-24 LAB — URINALYSIS, ROUTINE W REFLEX MICROSCOPIC
Bilirubin Urine: NEGATIVE
Glucose, UA: 250 mg/dL — AB
HGB URINE DIPSTICK: NEGATIVE
Ketones, ur: NEGATIVE mg/dL
LEUKOCYTES UA: NEGATIVE
Nitrite: NEGATIVE
Protein, ur: 100 mg/dL — AB
SPECIFIC GRAVITY, URINE: 1.018 (ref 1.005–1.030)
pH: 5 (ref 5.0–8.0)

## 2015-08-24 LAB — URINE MICROSCOPIC-ADD ON: Bacteria, UA: NONE SEEN

## 2015-08-24 LAB — TROPONIN I

## 2015-08-24 MED ORDER — CLOPIDOGREL BISULFATE 75 MG PO TABS
75.0000 mg | ORAL_TABLET | Freq: Every day | ORAL | Status: DC
  Administered 2015-08-25 – 2015-08-28 (×4): 75 mg via ORAL
  Filled 2015-08-24 (×4): qty 1

## 2015-08-24 MED ORDER — ASPIRIN EC 325 MG PO TBEC
325.0000 mg | DELAYED_RELEASE_TABLET | Freq: Every day | ORAL | Status: DC
  Administered 2015-08-25: 325 mg via ORAL
  Filled 2015-08-24: qty 1

## 2015-08-24 MED ORDER — IRBESARTAN 300 MG PO TABS
300.0000 mg | ORAL_TABLET | Freq: Every day | ORAL | Status: DC
  Administered 2015-08-25: 300 mg via ORAL
  Filled 2015-08-24: qty 1

## 2015-08-24 MED ORDER — CLONAZEPAM 0.5 MG PO TABS: 0.2500 mg | ORAL_TABLET | Freq: Three times a day (TID) | ORAL | Status: DC | PRN

## 2015-08-24 MED ORDER — INSULIN ASPART 100 UNIT/ML ~~LOC~~ SOLN
0.0000 [IU] | Freq: Three times a day (TID) | SUBCUTANEOUS | Status: DC
  Administered 2015-08-24: 8 [IU] via SUBCUTANEOUS
  Administered 2015-08-25: 15 [IU] via SUBCUTANEOUS
  Administered 2015-08-25 – 2015-08-26 (×2): 5 [IU] via SUBCUTANEOUS
  Administered 2015-08-26 (×2): 3 [IU] via SUBCUTANEOUS
  Administered 2015-08-27 (×2): 5 [IU] via SUBCUTANEOUS
  Administered 2015-08-27: 3 [IU] via SUBCUTANEOUS
  Administered 2015-08-28: 2 [IU] via SUBCUTANEOUS
  Administered 2015-08-28 (×2): 5 [IU] via SUBCUTANEOUS

## 2015-08-24 MED ORDER — METOCLOPRAMIDE HCL 5 MG/ML IJ SOLN
5.0000 mg | Freq: Four times a day (QID) | INTRAMUSCULAR | Status: DC
  Administered 2015-08-24 – 2015-08-28 (×15): 5 mg via INTRAVENOUS
  Filled 2015-08-24 (×15): qty 2

## 2015-08-24 MED ORDER — PANTOPRAZOLE SODIUM 40 MG PO TBEC
40.0000 mg | DELAYED_RELEASE_TABLET | Freq: Every day | ORAL | Status: DC
  Administered 2015-08-25 – 2015-08-28 (×4): 40 mg via ORAL
  Filled 2015-08-24 (×4): qty 1

## 2015-08-24 MED ORDER — SODIUM CHLORIDE 0.9 % IV SOLN
INTRAVENOUS | Status: DC
  Administered 2015-08-24: 21:00:00 via INTRAVENOUS

## 2015-08-24 MED ORDER — ONDANSETRON HCL 4 MG/2ML IJ SOLN
4.0000 mg | Freq: Four times a day (QID) | INTRAMUSCULAR | Status: DC | PRN
  Administered 2015-08-24 – 2015-08-25 (×3): 4 mg via INTRAVENOUS
  Filled 2015-08-24 (×3): qty 2

## 2015-08-24 MED ORDER — SODIUM CHLORIDE 0.9 % IV BOLUS (SEPSIS)
1000.0000 mL | Freq: Once | INTRAVENOUS | Status: AC
  Administered 2015-08-24: 1000 mL via INTRAVENOUS

## 2015-08-24 MED ORDER — ASPIRIN 300 MG RE SUPP
300.0000 mg | Freq: Every day | RECTAL | Status: DC
  Administered 2015-08-24: 300 mg via RECTAL
  Filled 2015-08-24: qty 1

## 2015-08-24 MED ORDER — ASPIRIN EC 325 MG PO TBEC: 325.0000 mg | DELAYED_RELEASE_TABLET | Freq: Every day | ORAL | Status: DC

## 2015-08-24 MED ORDER — ENOXAPARIN SODIUM 40 MG/0.4ML ~~LOC~~ SOLN
40.0000 mg | SUBCUTANEOUS | Status: DC
  Administered 2015-08-24 – 2015-08-27 (×4): 40 mg via SUBCUTANEOUS
  Filled 2015-08-24 (×4): qty 0.4

## 2015-08-24 MED ORDER — INSULIN ASPART 100 UNIT/ML ~~LOC~~ SOLN
0.0000 [IU] | Freq: Every day | SUBCUTANEOUS | Status: DC
  Administered 2015-08-24: 2 [IU] via SUBCUTANEOUS

## 2015-08-24 MED ORDER — POTASSIUM CHLORIDE CRYS ER 10 MEQ PO TBCR
10.0000 meq | EXTENDED_RELEASE_TABLET | Freq: Every day | ORAL | Status: DC
  Administered 2015-08-25 – 2015-08-28 (×4): 10 meq via ORAL
  Filled 2015-08-24 (×4): qty 1

## 2015-08-24 MED ORDER — NEBIVOLOL HCL 10 MG PO TABS
20.0000 mg | ORAL_TABLET | ORAL | Status: AC
  Filled 2015-08-24: qty 2

## 2015-08-24 MED ORDER — PROCHLORPERAZINE EDISYLATE 5 MG/ML IJ SOLN
5.0000 mg | Freq: Four times a day (QID) | INTRAMUSCULAR | Status: DC | PRN
  Administered 2015-08-24 – 2015-08-26 (×5): 5 mg via INTRAVENOUS
  Filled 2015-08-24 (×5): qty 2

## 2015-08-24 MED ORDER — ONDANSETRON 4 MG PO TBDP
ORAL_TABLET | ORAL | Status: AC
  Filled 2015-08-24: qty 1

## 2015-08-24 MED ORDER — INSULIN GLARGINE 100 UNIT/ML ~~LOC~~ SOLN
24.0000 [IU] | Freq: Every morning | SUBCUTANEOUS | Status: DC
  Administered 2015-08-25 – 2015-08-28 (×4): 24 [IU] via SUBCUTANEOUS
  Filled 2015-08-24 (×4): qty 0.24

## 2015-08-24 MED ORDER — FUROSEMIDE 80 MG PO TABS
80.0000 mg | ORAL_TABLET | Freq: Every day | ORAL | Status: DC
  Administered 2015-08-25 – 2015-08-28 (×4): 80 mg via ORAL
  Filled 2015-08-24 (×4): qty 1

## 2015-08-24 MED ORDER — AMLODIPINE BESYLATE 5 MG PO TABS
10.0000 mg | ORAL_TABLET | Freq: Once | ORAL | Status: AC
  Administered 2015-08-24: 10 mg via ORAL
  Filled 2015-08-24: qty 2

## 2015-08-24 MED ORDER — ONDANSETRON 4 MG PO TBDP
4.0000 mg | ORAL_TABLET | Freq: Once | ORAL | Status: AC
  Administered 2015-08-24: 4 mg via ORAL

## 2015-08-24 MED ORDER — VALSARTAN-HYDROCHLOROTHIAZIDE 320-25 MG PO TABS: 1.0000 | ORAL_TABLET | Freq: Every day | ORAL | Status: DC

## 2015-08-24 MED ORDER — STROKE: EARLY STAGES OF RECOVERY BOOK: Freq: Once | Status: DC

## 2015-08-24 MED ORDER — MECLIZINE HCL 25 MG PO TABS
12.5000 mg | ORAL_TABLET | Freq: Once | ORAL | Status: AC
  Administered 2015-08-24: 12.5 mg via ORAL
  Filled 2015-08-24: qty 1

## 2015-08-24 MED ORDER — HYDROCODONE-ACETAMINOPHEN 5-325 MG PO TABS: 1.0000 | ORAL_TABLET | Freq: Four times a day (QID) | ORAL | Status: DC | PRN

## 2015-08-24 MED ORDER — CLONIDINE HCL 0.2 MG PO TABS
0.2000 mg | ORAL_TABLET | Freq: Once | ORAL | Status: AC
  Administered 2015-08-24: 0.2 mg via ORAL
  Filled 2015-08-24: qty 1

## 2015-08-24 MED ORDER — HYDROCHLOROTHIAZIDE 25 MG PO TABS
25.0000 mg | ORAL_TABLET | Freq: Every day | ORAL | Status: DC
  Administered 2015-08-25 – 2015-08-28 (×4): 25 mg via ORAL
  Filled 2015-08-24 (×4): qty 1

## 2015-08-24 NOTE — Progress Notes (Signed)
Pt arrived to unit, oriented to room. Placed on telemetry.

## 2015-08-24 NOTE — ED Provider Notes (Signed)
CSN: [id number]     Arrival date & time 08/24/15  0800 History   First MD Initiated Contact with Patient 08/24/15 1053     Chief Complaint  Patient presents with  . Nausea  . Emesis   HPI Pt is a 82 y.o. female with recent breast cancer diagnosis and lumpectomy who presents for nausea, vomiting and dizziness.  Pt reports that on Friday Glenda [name] had 3 injections in her breast as part of preparing for radiation treatment. Yesterday morning Glenda [name] felt very dizzy whenever Glenda [name] stood up and felt that the room was spinning and like Glenda [name] might faint or fall. Glenda [name] had to hold onto to furniture. Then later yesterday Glenda [name] started feeling nauseated and threw up. Glenda [name] has not been able to take her medications because Glenda [name] threw them up. Her kids had to lift her into the car to come in because Glenda [name] was too dizzy to stand. Glenda [name] denies any fevers, abdominal pain, chest pain, ear pain, diarrhea. Glenda [name] reports that Glenda [name] has not had an appetite since Glenda [name] was diagnosed with cancer and hasn't been eating well or drinking much.   Past Medical History  Diagnosis Date  . GERD (gastroesophageal reflux disease)   . Obesity   . DM (diabetes mellitus) ([place])   . HTN (hypertension)   . HLD (hyperlipidemia)   . CAD (coronary artery disease)   . UTI (urinary tract infection)   . Chest pain   . Dizziness   . Breast cancer ([place])   . Arthritis   . Stroke ([place])   . Shortness of breath dyspnea    Past Surgical History  Procedure Laterality Date  . Cholecystectomy    . Colon polyps; hx    . Coronary angioplasty  02/14/09  . Radioactive seed guided mastectomy with axillary sentinel lymph node biopsy Right 07/17/2015    Procedure: RADIOACTIVE SEED GUIDED PARTIAL MASTECTOMY WITH AXILLARY SENTINEL LYMPH NODE BIOPSY;  Surgeon: Erroll Luna, MD;  Location: [place];  Service: General;  Laterality: Right;   Family History  Problem Relation Age of Onset  . Diabetes Mother   . Colon cancer Father     also had lung  . Diabetes    . Heart  attack    . Diabetes Sister     Glenda [name]  . Breast cancer Sister    Social History  Substance Use Topics  . Smoking status: Never Smoker   . Smokeless tobacco: Never Used  . Alcohol Use: No   OB History    No data available     Review of Systems  All other systems reviewed and are negative. See HPI    Allergies  Review of patient's allergies indicates no known allergies.  Home Medications   Prior to Admission medications   Medication Sig Start Date End Date Taking? Authorizing Provider  amLODipine (NORVASC) 10 MG tablet Take 10 mg by mouth daily.    Historical Provider, MD  aspirin 81 MG tablet Take 81 mg by mouth daily.    Historical Provider, MD  clonazePAM (KLONOPIN) 0.5 MG tablet Take 0.25 mg by mouth 3 (three) times daily as needed for anxiety.    Historical Provider, MD  cloNIDine (CATAPRES) 0.2 MG tablet Take 0.2 mg by mouth 2 (two) times daily.    Historical Provider, MD  clopidogrel (PLAVIX) 75 MG tablet Take 75 mg by mouth daily.    Historical Provider, MD  furosemide (LASIX) 80 MG tablet Take 80 mg by mouth daily.    Historical Provider, MD  HUMALOG KWIKPEN 100 UNIT/ML KiwkPen Inject 22-34 Units into the skin 2 (two) times daily. Sliding scale as follows: 80-199 = 22 units 200-299 = 26 units 300-399 = 30 units >400 = 34 units 04/30/14   Historical Provider, MD  HYDROcodone-acetaminophen (NORCO) 5-325 MG tablet Take 1 tablet by mouth every 6 (six) hours as needed for moderate pain. 07/17/15   Erroll Luna, MD  ibuprofen (ADVIL,MOTRIN) 100 MG/5ML suspension Take 600 mg by mouth every 4 (four) hours as needed for moderate pain.    Historical Provider, MD  Nebivolol HCl (BYSTOLIC) 20 MG TABS Take 20 mg by mouth daily.    Historical Provider, MD  omeprazole (PRILOSEC) 20 MG capsule Take 20 mg by mouth every morning.     Historical Provider, MD  potassium chloride (KLOR-CON) 10 MEQ CR tablet Take 10 mEq by mouth daily.      Historical Provider, MD  TOUJEO SOLOSTAR 300  UNIT/ML SOPN Inject 24 Units into the skin every morning.  11/22/14   Historical Provider, MD  valsartan-hydrochlorothiazide (DIOVAN-HCT) 320-25 MG per tablet Take 1 tablet by mouth daily.    Historical Provider, MD   BP 184/71 mmHg  Pulse 67  Temp(Src) 99 F (37.2 C) (Oral)  Resp 15  SpO2 100% Physical Exam  Constitutional: Glenda [name] is oriented to person, place, and time. Glenda [name] appears well-developed and well-nourished. No distress.  HENT:  Head: Normocephalic and atraumatic.  Right Ear: Tympanic membrane, external ear and ear canal normal.  Left Ear: Tympanic membrane, external ear and ear canal normal.  Nose: Nose normal.  Mouth/Throat: Uvula is midline and oropharynx is clear and moist. Mucous membranes are dry.  Eyes: Conjunctivae are normal. Pupils are equal, round, and reactive to light. Right eye exhibits no discharge. Left eye exhibits no discharge. No scleral icterus.  Neck: Normal range of motion. Neck supple.  Cardiovascular: Normal rate, regular rhythm, normal heart sounds and intact distal pulses.   No murmur heard. Pulmonary/Chest: Effort normal and breath sounds normal. No respiratory distress. Glenda [name] has no wheezes.  Abdominal: Soft. Bowel sounds are normal. Glenda [name] exhibits no distension. There is no tenderness.  Neurological: Glenda [name] is alert and oriented to person, place, and time. No cranial nerve deficit.  Skin: Skin is warm and dry. No rash noted. Glenda [name] is not diaphoretic. No pallor.  Psychiatric: Glenda [name] has a normal mood and affect. Her behavior is normal.  Nursing note and vitals reviewed.   ED Course  Procedures (including critical care time) Labs Review Labs Reviewed  COMPREHENSIVE METABOLIC PANEL - Abnormal; Notable for the following:    Glucose, Bld 241 (*)    BUN 28 (*)    Creatinine, Ser 1.42 (*)    Albumin 3.2 (*)    ALT 10 (*)    GFR calc non Af Amer 34 (*)    GFR calc Af Amer 40 (*)    All other components within normal limits  CBC - Abnormal; Notable for the  following:    Hemoglobin 11.5 (*)    HCT 35.6 (*)    All other components within normal limits  LIPASE, BLOOD  TROPONIN I  URINALYSIS, ROUTINE W REFLEX MICROSCOPIC (NOT AT [facility])    Imaging Review No results found. I have personally reviewed and evaluated these images and lab results as part of my medical decision-making.   EKG Interpretation None      MDM   Final diagnoses:  None   Nausea, vomiting and dizziness on standing in setting of poor PO intake  for several months. Benign abdomen. Creat at baseline and other labs normal. Will give zofran and fluids and check EKG and orthostatic vitals.   After standing patient Glenda [name] is markedly ataxic, given history of stroke an MRI brain was obtained showing new cerebellar infarct. TRH to admit. Call neuro for consult, well out of window at ~30 hours.   Frazier Richards, MD 08/24/15 1357  Ezequiel Essex, MD 08/26/15 1153

## 2015-08-24 NOTE — H&P (Signed)
History and Physical    Glenda [name] [id number] DOB: 04/05/1934 DOA: 08/24/2015  Referring MD/NP/PA: Wyvonnia Dusky / ER PCP: Donnie Coffin, MD  Outpatient Specialists: Loleta Dicker / Oncology; [facility] / Surgery; [facility] / Rad Oncology; Meda Coffee / Cardiology Patient coming from: Private residence  Chief Complaint: Dizziness with difficulty walking  HPI: Glenda [name] is a 82 y.o. female with medical history significant for CAD with moderate CAD on cath 2012, hypertension, dyslipidemia, diabetes on insulin previous E documented is uncontrolled, obesity, GERD and recent diagnosis of right-sided breast cancer status post partial mastectomy i.e. lumpectomy with pathology consistent with DCIS. Patient is scheduled to proceed with radiation oncology in the near future. She reports that beginning this past Friday morning she became quite dizzy and was having difficulty standing and was also having dizziness while sitting. The dizziness has worsened. This was not associated with visual changes but she was nauseous. She's not had any facial drooping numbness, extremity numbness or weakness. At the prompting of her family she presented to the ER for further evaluation and treatment.  ED Course:  Temp 99.3-BP 204/59-pulse 60-respirations 16-year sats 100% Follow-up blood pressure after treatment with home antihypertensives: BP 186/75 MRI brain without contrast: Punctate acute infarct in the right middle cerebral peduncle, moderate chronic small vessel ischemic changes Lab data: Sodium 145, potassium 3.9, BUN 28, creatinine 1.42, glucose 241, albumin 3.2, ALT 10, troponin less than 0.03, WBC 5700 noted differential obtained, hemoglobin 11.5, platelets 241,000  Review of Systems:  In addition to the HPI above,  No Fever-chills, myalgias or other constitutional symptoms No Headache, changes with Vision or hearing, new weakness, tingling, numbness in any extremity, dizziness as described above No problems  swallowing food or Liquids, indigestion/reflux No Chest pain, Cough or Shortness of Breath, palpitations, orthopnea or DOE No Abdominal pain, N/V; no melena or hematochezia, no dark tarry stools, Bowel movements are regular, No dysuria, hematuria or flank pain No new skin rashes, lesions, masses or bruises, No new joints pains-aches No recent weight gain or loss No polyuria, polydypsia or polyphagia,   Past Medical History  Diagnosis Date  . GERD (gastroesophageal reflux disease)   . Obesity   . DM (diabetes mellitus) ([place])   . HTN (hypertension)   . HLD (hyperlipidemia)   . CAD (coronary artery disease)   . UTI (urinary tract infection)   . Chest pain   . Dizziness   . Breast cancer ([place])   . Arthritis   . Stroke ([place])   . Shortness of breath dyspnea     Past Surgical History  Procedure Laterality Date  . Cholecystectomy    . Colon polyps; hx    . Coronary angioplasty  02/14/09  . Radioactive seed guided mastectomy with axillary sentinel lymph node biopsy Right 07/17/2015    Procedure: RADIOACTIVE SEED GUIDED PARTIAL MASTECTOMY WITH AXILLARY SENTINEL LYMPH NODE BIOPSY;  Surgeon: Erroll Luna, MD;  Location: [place];  Service: General;  Laterality: Right;     reports that she has never smoked. She has never used smokeless tobacco. She reports that she does not drink alcohol or use illicit drugs.  No Known Allergies  Family History  Problem Relation Age of Onset  . Diabetes Mother   . Colon cancer Father     also had lung  . Diabetes    . Heart attack    . Diabetes Sister     [name]  . Breast cancer Sister    Family history reviewed and not pertinent   Prior to  Admission medications   Medication Sig Start Date End Date Taking? Authorizing Provider  amLODipine (NORVASC) 10 MG tablet Take 10 mg by mouth daily.    Historical Provider, MD  aspirin 81 MG tablet Take 81 mg by mouth daily.    Historical Provider, MD  clonazePAM (KLONOPIN) 0.5 MG tablet Take 0.25 mg by  mouth 3 (three) times daily as needed for anxiety.    Historical Provider, MD  cloNIDine (CATAPRES) 0.2 MG tablet Take 0.2 mg by mouth 2 (two) times daily.    Historical Provider, MD  clopidogrel (PLAVIX) 75 MG tablet Take 75 mg by mouth daily.    Historical Provider, MD  furosemide (LASIX) 80 MG tablet Take 80 mg by mouth daily.    Historical Provider, MD  HUMALOG KWIKPEN 100 UNIT/ML KiwkPen Inject 22-34 Units into the skin 2 (two) times daily. Sliding scale as follows: 80-199 = 22 units 200-299 = 26 units 300-399 = 30 units >400 = 34 units 04/30/14   Historical Provider, MD  HYDROcodone-acetaminophen (NORCO) 5-325 MG tablet Take 1 tablet by mouth every 6 (six) hours as needed for moderate pain. 07/17/15   Erroll Luna, MD  ibuprofen (ADVIL,MOTRIN) 100 MG/5ML suspension Take 600 mg by mouth every 4 (four) hours as needed for moderate pain.    Historical Provider, MD  Nebivolol HCl (BYSTOLIC) 20 MG TABS Take 20 mg by mouth daily.    Historical Provider, MD  omeprazole (PRILOSEC) 20 MG capsule Take 20 mg by mouth every morning.     Historical Provider, MD  potassium chloride (KLOR-CON) 10 MEQ CR tablet Take 10 mEq by mouth daily.      Historical Provider, MD  TOUJEO SOLOSTAR 300 UNIT/ML SOPN Inject 24 Units into the skin every morning.  11/22/14   Historical Provider, MD  valsartan-hydrochlorothiazide (DIOVAN-HCT) 320-25 MG per tablet Take 1 tablet by mouth daily.    Historical Provider, MD    Physical Exam: Filed Vitals:   08/24/15 1141 08/24/15 1141 08/24/15 1142 08/24/15 1301  BP: 204/62 208/70 184/71 186/75  Pulse: 69 66 67   Temp:      TempSrc:      Resp: 16 15 15    SpO2: 100% 100% 100%       Constitutional: NAD, calm, comfortable Eyes: PERRL, lids and conjunctivae normal ENMT: Mucous membranes are moist. Posterior pharynx clear of any exudate or lesions.Normal dentition.  Neck: normal, supple, no masses, no thyromegaly Respiratory: clear to auscultation bilaterally, no wheezing,  no crackles. Normal respiratory effort. No accessory muscle use.  Cardiovascular: Regular rate (occasionally bradycardic) and rhythm, no murmurs / rubs / gallops. No extremity edema. 2+ pedal pulses. No carotid bruits.  Abdomen: no tenderness, no masses palpated. No hepatosplenomegaly. Bowel sounds positive.  Musculoskeletal: no clubbing / cyanosis. No joint deformity upper and lower extremities. Good ROM, no contractures. Normal muscle tone.  Skin: no rashes, lesions, ulcers. No induration Neurologic: CN 2-12 grossly intact. Sensation intact, DTR normal. Strength 5/5 x all 4 extremities. No nystagmus elicited on exam-gait and ambulation were not checked-intact heel-to-shin maneuver bilateral lower extremities Psychiatric: Normal judgment and insight. Alert and oriented x 3. Normal mood.    Labs on Admission: I have personally reviewed following labs and imaging studies  CBC:  Recent Labs Lab 08/24/15 0900  WBC 5.7  HGB 11.5*  HCT 35.6*  MCV 81.7  PLT [id number]   Basic Metabolic Panel:  Recent Labs Lab 08/24/15 0900  NA 145  K 3.9  CL 111  CO2 25  GLUCOSE 241*  BUN 28*  CREATININE 1.42*  CALCIUM 9.3   GFR: CrCl cannot be calculated (Unknown ideal weight.). Liver Function Tests:  Recent Labs Lab 08/24/15 0900  AST 17  ALT 10*  ALKPHOS 106  BILITOT 0.4  PROT 7.4  ALBUMIN 3.2*    Recent Labs Lab 08/24/15 0900  LIPASE 35   No results for input(s): AMMONIA in the last 168 hours. Coagulation Profile: No results for input(s): INR, PROTIME in the last 168 hours. Cardiac Enzymes:  Recent Labs Lab 08/24/15 1133  TROPONINI <0.03   BNP (last 3 results) No results for input(s): PROBNP in the last 8760 hours. HbA1C: No results for input(s): HGBA1C in the last 72 hours. CBG: No results for input(s): GLUCAP in the last 168 hours. Lipid Profile: No results for input(s): CHOL, HDL, LDLCALC, TRIG, CHOLHDL, LDLDIRECT in the last 72 hours. Thyroid Function Tests: No  results for input(s): TSH, T4TOTAL, FREET4, T3FREE, THYROIDAB in the last 72 hours. Anemia Panel: No results for input(s): VITAMINB12, FOLATE, FERRITIN, TIBC, IRON, RETICCTPCT in the last 72 hours. Urine analysis:    Component Value Date/Time   COLORURINE YELLOW 07/04/2014 1900   APPEARANCEUR CLOUDY* 07/04/2014 1900   LABSPEC 1.025 07/04/2014 1900   PHURINE 6.0 07/04/2014 1900   GLUCOSEU 500* 07/04/2014 1900   HGBUR MODERATE* 07/04/2014 1900   BILIRUBINUR NEGATIVE 07/04/2014 1900   KETONESUR NEGATIVE 07/04/2014 1900   PROTEINUR 100* 07/04/2014 1900   UROBILINOGEN 0.2 07/04/2014 1900   NITRITE NEGATIVE 07/04/2014 1900   LEUKOCYTESUR MODERATE* 07/04/2014 1900   Sepsis Labs: @LABRCNTIP (procalcitonin:4,lacticidven:4) )No results found for this or any previous visit (from the past 240 hour(s)).   Radiological Exams on Admission: Mr Brain Wo Contrast  08/24/2015  CLINICAL DATA:  Ataxia with nausea and vomiting since yesterday. Assessment for stroke. EXAM: MRI HEAD WITHOUT CONTRAST TECHNIQUE: Multiplanar, multiecho pulse sequences of the brain and surrounding structures were obtained without intravenous contrast. COMPARISON:  07/04/2014 FINDINGS: There is a 4 mm acute infarct in the medial aspect of the right middle cerebellar peduncle. No intracranial hemorrhage, mass, midline shift, or extra-axial fluid collection is identified. Small chronic infarcts are seen in the pons and thalami. A chronic infarct in the posterior limb of the internal capsule was acute on the prior MRI. There is new wallerian degeneration along the left corticospinal tract in the cerebral peduncle. There is mild cerebral atrophy which is unchanged. Foci of T2 hyperintensity throughout the cerebral white matter and pons are similar to the prior MRI and nonspecific but compatible with moderate chronic small vessel ischemic disease. Prior bilateral cataract extraction is noted. There is a trace right mastoid effusion, and there  is minimal left anterior ethmoid air cell mucosal thickening. Major intracranial vascular flow voids are preserved. IMPRESSION: 1. Punctate acute infarct in the right middle cerebellar peduncle. 2. Moderate chronic small vessel ischemic changes as above. Electronically Signed   By: Logan Bores M.D.   On: 08/24/2015 13:18    EKG: (Independently reviewed) pending  Assessment/Plan Principal Problem:   Acute ischemic stroke/right cerebellar/history of CVA -Presented with dizziness and uncontrolled hypertension with MRI evidence of acute right cerebellar region stroke -Symptoms began on Friday so clearly outside of TPA window -Await neuro hospitalist evaluation -Routine stroke evaluation with carotid duplex or other imaging as directed by the neurologist; check hemoglobin A1c and lipid panel -PT/OT/SLP evaluation -Increase from home dose baby aspirin to full dose aspirin -Due to significant ataxia patient may require more prolonged than usual hospital stay  pending PT evaluation  Active Problems:   Diabetes mellitus type 1, uncontrolled, insulin dependent  -Current CBG greater than 200 with normal anion gap -Patient admits to not using long-acting insulin secondary to poor oral intake but states she utilizes her sliding scale insulin regularly; I discussed with patient need for more consistent glycemic control to prevent vascular inflammation that could lead to ectasia and lead to problems such as strokes, renal failure or heart attack -Diabetes educator consultation -Continue Toujeo -SSI -Hemoglobin A1c as above    Uncontrolled hypertension -Continue preadmission medications: Norvasc, Catapres, Lasix, Bystolic, and valsartan /hydrochlorothiazide -With acute stroke will allow for permissive hypertension    CAD (coronary artery disease) -Currently asymptomatic -Continue preadmission medications including Plavix    Breast cancer of upper-outer quadrant of right female breast  -Follow-up  with radiation oncology after discharge; timing of initiation of radiation therapy may need to be adjusted given current diagnosis of acute CVA    Hyperlipidemia -Not on statin prior to admission      DVT prophylaxis: Lovenox  Code Status: Full code Family Communication: Daughter Olivine Kadish and other family members at bedside Disposition Plan: Discharge back to prior home environment pending PT/OT evaluation Consults called: Neuro hospitalist Admission status: Telemetry/inpatient  Mobility: Utilize a rolling walker when going on extended walking distances such as when shopping at the mall   Glessie Eustice L. ANP-BC [facility] Pager [phone]   If 7PM-7AM, please contact night-coverage [email or web address] Password [facility]  08/24/2015, 2:34 PM

## 2015-08-24 NOTE — Progress Notes (Signed)
rn attempting to do stroke swallow screen. ED nurse reported pt had been vomiting prior to transport. Pt started vomiting in middle of stroke swallow screen. Pt failed due to that reason. Speech paged and made aware Pt did brady to 45 HR while vomiting.   md at bedside 1615, made aware pt failed swallow screen, HR 45, and that pt was still actively vomiting.

## 2015-08-24 NOTE — Progress Notes (Signed)
Pt continues to vomit, initially pts vomit clear, now pt vomiting green liquid. Vomit has an unusual odor to it. Pt reports she had a bowel movement yesterday, but that it was small.  Will alert night hospitalist.

## 2015-08-24 NOTE — Consult Note (Signed)
Neurology Consultation Reason for Consult: Stroke Referring Physician: [name], C  CC: Vertigo  History is obtained from: Patient  HPI: Glenda [name] is a 82 y.o. female who has been complaining of vertigo and nausea/vomiting for the past couple of days. She stated that it started on Friday and has been persistent. Due to no improvement, she sought care in the emergency department where an MRI was performed which does show punctate cerebellar stroke.   LKW: Friday tpa given?: no, out of window    ROS: A 14 point ROS was performed and is negative except as noted in the HPI.   Past Medical History  Diagnosis Date  . GERD (gastroesophageal reflux disease)   . Obesity   . DM (diabetes mellitus) ([place])   . HTN (hypertension)   . HLD (hyperlipidemia)   . CAD (coronary artery disease)   . UTI (urinary tract infection)   . Chest pain   . Dizziness   . Breast cancer ([place])   . Arthritis   . Stroke ([place])   . Shortness of breath dyspnea      Family History  Problem Relation Age of Onset  . Diabetes Mother   . Colon cancer Father     also had lung  . Diabetes    . Heart attack    . Diabetes Sister     [name]  . Breast cancer Sister      Social History:  reports that she has never smoked. She has never used smokeless tobacco. She reports that she does not drink alcohol or use illicit drugs.   Exam: Current vital signs: BP 171/56 mmHg  Pulse 66  Temp(Src) 98.1 F (36.7 C) (Oral)  Resp 16  SpO2 100% Vital signs in last 24 hours: Temp:  [98.1 F (36.7 C)-99 F (37.2 C)] 98.1 F (36.7 C) (04/30 1600) Pulse Rate:  [29-73] 66 (04/30 1808) Resp:  [15-16] 16 (04/30 1808) BP: (142-208)/(49-126) 171/56 mmHg (04/30 1808) SpO2:  [98 %-100 %] 100 % (04/30 1808)   Physical Exam  Constitutional: Appears well-developed and well-nourished.  Psych: Affect appropriate to situation Eyes: No scleral injection HENT: No OP obstrucion Head: Normocephalic.  Cardiovascular: Normal  rate and regular rhythm.  Respiratory: Effort normal and breath sounds normal to anterior ascultation GI: Soft.  No distension. There is no tenderness.  Skin: WDI  Neuro: Mental Status: Patient is awake, alert, oriented to person, place, month, year, and situation. Patient is able to give a clear and coherent history. No signs of aphasia or neglect Cranial Nerves: II: She has significant loss of peripheral vision, particularly on the right. Pupils are equal, round, and reactive to light.   III,IV, VI: EOMI without ptosis or diploplia.  V: Facial sensation is symmetric to temperature VII: Facial movement is symmetric.  VIII: hearing is intact to voice X: Uvula elevates symmetrically XI: Shoulder shrug is symmetric. XII: tongue is midline without atrophy or fasciculations.  Motor: Tone is normal. Bulk is normal. 5/5 strength was present in all four extremities.  Sensory: Sensation is symmetric to light touch and temperature in the arms and legs. Cerebellar: FNF and HKS are intact on the left, impaired on the right.  I have reviewed labs in epic and the results pertinent to this consultation are: Borderline creatinine  I have reviewed the images obtained: MRI brain punctate infarct in the cerebellum  Impression: 82 year old female with cerebellar infarct. She has loss of vision peripherally, much worse on the right and both eyes. Though hemianopia  is possible, given that she had loss of peripheral vision in March 2016 ([place] note) I think that this is an old deficit, possibly related to ophthalmologic problems. He does seem to be something on the MRI brain that would explain it.  She will need a workup for secondary stroke prevention and physical therapy.  Recommendations: 1. HgbA1c, fasting lipid panel 2. MRA  of the brain without contrast, MRA of the neck with/without contrast 3. Frequent neuro checks 4. Echocardiogram 5. Carotid dopplers are not needed given MRA neck 6.  Prophylactic therapy-Antiplatelet med: Aspirin - dose 325mg  PO or 300mg  PR 7. Risk factor modification 8. Telemetry monitoring 9. PT consult, OT consult, Speech consult 10. please page stroke NP  Or  PA  Or MD  M-F from 8am -4 pm starting 5/1 as this patient will be followed by the stroke team at this point.   You can look them up on [email or web address]     Roland Rack, MD [facility] [phone]  If 7pm- 7am, please page neurology on call as listed in [place].

## 2015-08-24 NOTE — ED Notes (Signed)
To MRI

## 2015-08-24 NOTE — ED Notes (Signed)
Pt vomiting so stroke swollow not done

## 2015-08-24 NOTE — Progress Notes (Signed)
Patient has been vomiting since 1915 unable to keep any liquids down. Patient states anytime she moves her head she vomits. Abdominal xray negative. MD notified RN inquiring about changing patients medication administration route, IV fluids, and treatment plan for controlling N/V. MRI called for patient to go for MRI, will call back to see if vomiting is more controlled.

## 2015-08-24 NOTE — ED Notes (Signed)
Daughter stated, shes been sick on her stomach since yesterday with N/V, unable to keep anything down.

## 2015-08-25 ENCOUNTER — Inpatient Hospital Stay (HOSPITAL_COMMUNITY): Payer: Medicare Other

## 2015-08-25 ENCOUNTER — Encounter (HOSPITAL_COMMUNITY): Payer: Self-pay | Admitting: Physical Medicine & Rehabilitation

## 2015-08-25 DIAGNOSIS — E1065 Type 1 diabetes mellitus with hyperglycemia: Secondary | ICD-10-CM

## 2015-08-25 DIAGNOSIS — I69398 Other sequelae of cerebral infarction: Secondary | ICD-10-CM

## 2015-08-25 DIAGNOSIS — E119 Type 2 diabetes mellitus without complications: Secondary | ICD-10-CM | POA: Insufficient documentation

## 2015-08-25 DIAGNOSIS — G8190 Hemiplegia, unspecified affecting unspecified side: Secondary | ICD-10-CM

## 2015-08-25 DIAGNOSIS — N179 Acute kidney failure, unspecified: Secondary | ICD-10-CM | POA: Insufficient documentation

## 2015-08-25 DIAGNOSIS — Z853 Personal history of malignant neoplasm of breast: Secondary | ICD-10-CM

## 2015-08-25 DIAGNOSIS — I10 Essential (primary) hypertension: Secondary | ICD-10-CM | POA: Insufficient documentation

## 2015-08-25 DIAGNOSIS — I2510 Atherosclerotic heart disease of native coronary artery without angina pectoris: Secondary | ICD-10-CM

## 2015-08-25 DIAGNOSIS — D62 Acute posthemorrhagic anemia: Secondary | ICD-10-CM

## 2015-08-25 DIAGNOSIS — R269 Unspecified abnormalities of gait and mobility: Secondary | ICD-10-CM

## 2015-08-25 DIAGNOSIS — E108 Type 1 diabetes mellitus with unspecified complications: Secondary | ICD-10-CM

## 2015-08-25 DIAGNOSIS — I639 Cerebral infarction, unspecified: Principal | ICD-10-CM

## 2015-08-25 DIAGNOSIS — I69391 Dysphagia following cerebral infarction: Secondary | ICD-10-CM

## 2015-08-25 LAB — GLUCOSE, CAPILLARY
Glucose-Capillary: 251 mg/dL — ABNORMAL HIGH (ref 65–99)
Glucose-Capillary: 358 mg/dL — ABNORMAL HIGH (ref 65–99)
Glucose-Capillary: 218 mg/dL — ABNORMAL HIGH (ref 65–99)

## 2015-08-25 LAB — LIPID PANEL
CHOL/HDL RATIO: 3.2 ratio
Cholesterol: 151 mg/dL (ref 0–200)
HDL: 47 mg/dL (ref >40)
LDL Cholesterol: 70 mg/dL (ref 0–99)
TRIGLYCERIDES: 168 mg/dL — AB (ref <150)
VLDL: 34 mg/dL (ref 0–40)

## 2015-08-25 LAB — HEMOGLOBIN A1C
Hgb A1c MFr Bld: 9.9 % — ABNORMAL HIGH (ref 4.8–5.6)
Mean Plasma Glucose: 237 mg/dL

## 2015-08-25 MED ORDER — LABETALOL HCL 5 MG/ML IV SOLN
10.0000 mg | INTRAVENOUS | Status: DC | PRN
  Administered 2015-08-25 – 2015-08-27 (×6): 10 mg via INTRAVENOUS
  Filled 2015-08-25 (×6): qty 4

## 2015-08-25 MED ORDER — GADOBENATE DIMEGLUMINE 529 MG/ML IV SOLN
10.0000 mL | Freq: Once | INTRAVENOUS | Status: AC | PRN
  Administered 2015-08-25: 7 mL via INTRAVENOUS

## 2015-08-25 MED ORDER — SIMVASTATIN 20 MG PO TABS
20.0000 mg | ORAL_TABLET | Freq: Every day | ORAL | Status: DC
  Administered 2015-08-25 – 2015-08-27 (×3): 20 mg via ORAL
  Filled 2015-08-25 (×3): qty 1

## 2015-08-25 MED ORDER — HYDRALAZINE HCL 20 MG/ML IJ SOLN
10.0000 mg | Freq: Once | INTRAMUSCULAR | Status: AC
  Administered 2015-08-25: 10 mg via INTRAVENOUS
  Filled 2015-08-25: qty 1

## 2015-08-25 NOTE — Progress Notes (Signed)
Bedside report received from previous shift RN and pt is currently in MRI at this time.    Ave Filter, RN

## 2015-08-25 NOTE — Consult Note (Signed)
Physical Medicine and Rehabilitation Consult  Reason for Consult: Difficulty walking due to dizziness, nausea, vision problems and decreased coordination.  Referring Physician: Dr. Leonie Man   HPI: Glenda [name] is a 82 y.o. female with history of HTN, T2DM, CAD, breast cancer s/p partial mastectomy with node biopsy 07/17/15 (to start XRT next week) who was admitted on 08/24/15 with dizziness, difficulty walking, loss of peripheral vision R> L  and nausea/vomiting. MRI brain done revealing punctate infarct in right middle cerebral peduncle and moderate SVD.  MRA head/neck showed 50% stenosis of cavernous and supraclinoid left ICA segments, 75% stenosis proximal R-VA 2 cm from origin and 75% stenosis R-CCA.  Therapy evaluations done today and patient limited by ataxia right side,  nausea, visual deficits and balance deficits.    Review of Systems  HENT: Positive for hearing loss.   Eyes: Negative for blurred vision and double vision.  Respiratory: Negative for cough and shortness of breath.   Cardiovascular: Negative for chest pain and palpitations.  Gastrointestinal: Positive for nausea and vomiting.  Genitourinary: Negative for dysuria and urgency.  Musculoskeletal: Negative for myalgias.  Neurological: Positive for dizziness, speech change and focal weakness. Negative for headaches.  All other systems reviewed and are negative.     Past Medical History  Diagnosis Date  . GERD (gastroesophageal reflux disease)   . Obesity   . DM (diabetes mellitus) ([place])   . HTN (hypertension)   . HLD (hyperlipidemia)   . CAD (coronary artery disease)   . UTI (urinary tract infection)   . Chest pain   . Dizziness   . Breast cancer ([place])   . Arthritis   . Stroke ([place])   . Shortness of breath dyspnea     Past Surgical History  Procedure Laterality Date  . Cholecystectomy    . Colon polyps; hx    . Coronary angioplasty  02/14/09  . Radioactive seed guided mastectomy with axillary  sentinel lymph node biopsy Right 07/17/2015    Procedure: RADIOACTIVE SEED GUIDED PARTIAL MASTECTOMY WITH AXILLARY SENTINEL LYMPH NODE BIOPSY;  Surgeon: Erroll Luna, MD;  Location: [facility];  Service: General;  Laterality: Right;     Family History  Problem Relation Age of Onset  . Diabetes Mother   . Colon cancer Father     also had lung  . Diabetes    . Heart attack    . Diabetes Sister     [name]  . Breast cancer Sister      Social History:  Retired from [employer]. Lives with daughter (works days), But family available for 24/7 support. She  reports that she has never smoked. She has never used smokeless tobacco. She reports that she does not drink alcohol or use illicit drugs.    Allergies: No Known Allergies    Medications Prior to Admission  Medication Sig Dispense Refill  . amLODipine (NORVASC) 10 MG tablet Take 10 mg by mouth daily.    Marland Kitchen aspirin 81 MG tablet Take 81 mg by mouth daily.    . clonazePAM (KLONOPIN) 0.5 MG tablet Take 0.25 mg by mouth 3 (three) times daily as needed for anxiety.    . cloNIDine (CATAPRES) 0.2 MG tablet Take 0.2 mg by mouth 2 (two) times daily.    . clopidogrel (PLAVIX) 75 MG tablet Take 75 mg by mouth daily.    . furosemide (LASIX) 80 MG tablet Take 80 mg by mouth daily.    Marland Kitchen HUMALOG KWIKPEN 100 UNIT/ML KiwkPen Inject  22-34 Units into the skin 2 (two) times daily. Sliding scale as follows: 80-199 = 22 units 200-299 = 26 units 300-399 = 30 units >400 = 34 units  3  . HYDROcodone-acetaminophen (NORCO) 5-325 MG tablet Take 1 tablet by mouth every 6 (six) hours as needed for moderate pain. 30 tablet 0  . ibuprofen (ADVIL,MOTRIN) 100 MG/5ML suspension Take 600 mg by mouth every 4 (four) hours as needed for moderate pain.    . Nebivolol HCl (BYSTOLIC) 20 MG TABS Take 20 mg by mouth daily.    Marland Kitchen omeprazole (PRILOSEC) 20 MG capsule Take 20 mg by mouth every morning.     . potassium chloride (KLOR-CON) 10 MEQ CR tablet Take 10 mEq by mouth daily.        Nelva Nay SOLOSTAR 300 UNIT/ML SOPN Inject 24 Units into the skin every morning.   11  . valsartan-hydrochlorothiazide (DIOVAN-HCT) 320-25 MG per tablet Take 1 tablet by mouth daily.      Home: Home Living Family/patient expects to be discharged to:: Unsure Living Arrangements: Children Available Help at Discharge: Family, Available PRN/intermittently Type of Home: House Home Access: Stairs to enter (getting ramp soon) Technical brewer of Steps: 3 Entrance Stairs-Rails: None Home Layout: One level Bathroom Shower/Tub: Chiropodist: Standard Home Equipment: Environmental consultant - 2 wheels, Cane - single point  Functional History: Prior Function Level of Independence: Independent Functional Status:  Mobility: Bed Mobility Overal bed mobility: Needs Assistance Bed Mobility: Supine to Sit Supine to sit: Min guard, HOB elevated Sit to supine: Mod assist General bed mobility comments: Increased time to get to EOB, no assist needed. Cues for technique. + dizziness.  Transfers Overall transfer level: Needs assistance Equipment used: Rolling walker (2 wheeled), None Transfers: Sit to/from Stand, [employer] Transfers Sit to Stand: Min assist, Mod assist Stand pivot transfers: Mod assist General transfer comment: Min-Mod A to stand from different surfaces with ataxia present upon standing. + dizziness. SPT bed to/from [facility] Mod A for balance. Uncontrolled descent onto BSC/bed. Transferred to chair post ambulation bout. Ambulation/Gait Ambulation/Gait assistance: Mod assist Ambulation Distance (Feet): 6 Feet Assistive device: Rolling walker (2 wheeled) Gait Pattern/deviations: Ataxic, Leaning posteriorly General Gait Details: Cues for balance as pt with truncal and LE ataxia on right side. Right knee instability noted as well. + dizziness, + emesis limiting distance.  Gait velocity: decreased    ADL: ADL Overall ADL's : Needs assistance/impaired Grooming: Wash/dry face,  Supervision/safety, Set up, Sitting Lower Body Dressing: Moderate assistance, Sit to/from stand Toilet Transfer: Moderate assistance (sit to stand from bed) Functional mobility during ADLs:  (Mod assist for sit to stand from bed) General ADL Comments: Pt sat EOB and pt nauseous.  Cognition: Cognition Overall Cognitive Status: Within Functional Limits for tasks assessed (delayed processing at times.) Orientation Level: Oriented X4 Cognition Arousal/Alertness: Awake/alert Behavior During Therapy: Flat affect Overall Cognitive Status: Within Functional Limits for tasks assessed (delayed processing at times.)    Blood pressure 187/62, pulse 82, temperature 98 F (36.7 C), temperature source Oral, resp. rate 20, height 5\' 6"  (1.676 m), weight 71.3 kg (157 lb 3 oz), SpO2 99 %. Physical Exam  Nursing note and vitals reviewed. Constitutional: She is oriented to person, place, and time. She appears well-developed and well-nourished. She is easily aroused.  HENT:  Head: Normocephalic and atraumatic.  Eyes: Conjunctivae and EOM are normal. Pupils are equal, round, and reactive to light.  Neck: Normal range of motion. Neck supple.  Cardiovascular: Normal rate and regular  rhythm.   No murmur heard. Respiratory: Effort normal and breath sounds normal. No stridor. No respiratory distress. She has no wheezes.  GI: Soft. Bowel sounds are normal. She exhibits no distension. There is no tenderness.  Musculoskeletal: She exhibits no edema or tenderness.  Neurological: She is alert, oriented to person, place, and time and easily aroused. A cranial nerve deficit is present.  Right facial weakness with ataxic speech.   Able to follow basic one and two step commands.   Bilateral peripheral visual field loss.  No nystagmus noted but reported dizziness with head turns.   Sensation intact to light touch DTRs symmetric Motor: Left upper and left lower extremity 4+/5 proximal to distal Right upper and  right lower extremity 4/5 proximal to distal Right upper extremity with ataxia and decreased [place]  Skin: Skin is warm and dry.  Psychiatric: She has a normal mood and affect. She is slowed. Cognition and memory are normal.    Results for orders placed or performed during the hospital encounter of 08/24/15 (from the past 24 hour(s))  Glucose, capillary     Status: Abnormal   Collection Time: 08/24/15  5:22 PM  Result Value Ref Range   Glucose-Capillary 277 (H) 65 - 99 mg/dL  Urinalysis, Routine w reflex microscopic     Status: Abnormal   Collection Time: 08/24/15  6:24 PM  Result Value Ref Range   Color, Urine YELLOW YELLOW   APPearance CLEAR CLEAR   Specific Gravity, Urine 1.018 1.005 - 1.030   pH 5.0 5.0 - 8.0   Glucose, UA 250 (A) NEGATIVE mg/dL   Hgb urine dipstick NEGATIVE NEGATIVE   Bilirubin Urine NEGATIVE NEGATIVE   Ketones, ur NEGATIVE NEGATIVE mg/dL   Protein, ur 100 (A) NEGATIVE mg/dL   Nitrite NEGATIVE NEGATIVE   Leukocytes, UA NEGATIVE NEGATIVE  Urine microscopic-add on     Status: Abnormal   Collection Time: 08/24/15  6:24 PM  Result Value Ref Range   Squamous Epithelial / LPF 0-5 (A) NONE SEEN   WBC, UA 0-5 0 - 5 WBC/hpf   RBC / HPF 0-5 0 - 5 RBC/hpf   Bacteria, UA NONE SEEN NONE SEEN  Glucose, capillary     Status: Abnormal   Collection Time: 08/24/15  9:48 PM  Result Value Ref Range   Glucose-Capillary 209 (H) 65 - 99 mg/dL   Comment 1 Notify RN    Comment 2 Document in Chart   Lipid panel     Status: Abnormal   Collection Time: 08/25/15  3:53 AM  Result Value Ref Range   Cholesterol 151 0 - 200 mg/dL   Triglycerides 168 (H) <150 mg/dL   HDL 47 >40 mg/dL   Total CHOL/HDL Ratio 3.2 RATIO   VLDL 34 0 - 40 mg/dL   LDL Cholesterol 70 0 - 99 mg/dL  Glucose, capillary     Status: Abnormal   Collection Time: 08/25/15  6:40 AM  Result Value Ref Range   Glucose-Capillary 251 (H) 65 - 99 mg/dL   Comment 1 Notify RN    Comment 2 Document in Chart    Mr Virgel Paling Wo Contrast  08/25/2015  CLINICAL DATA:  Continued surveillance of acute posterior circulation infarct. EXAM: MRA NECK WITHOUT AND WITH CONTRAST MRA HEAD WITHOUT CONTRAST TECHNIQUE: Multiplanar and multiecho pulse sequences of the neck were obtained without and with intravenous contrast. Angiographic images of the neck were obtained using MRA technique without and with intravenous contrast.; Angiographic images of the Circle  of Willis were obtained using MRA technique without intravenous contrast. CONTRAST:  63mL MULTIHANCE GADOBENATE DIMEGLUMINE 529 MG/ML IV SOLN COMPARISON:  MR brain 08/24/2015. FINDINGS: MRA NECK FINDINGS Dolichoectasia proximal vasculature. Conventional branching of the great vessels from the arch. The proximal RIGHT common carotid artery demonstrates a estimated 75% stenosis. No flow-limiting disease at the bifurcation or throughout the course of the visualized RIGHT cervical ICA. Poorly visualized proximal LEFT common carotid artery without definite proximal narrowing. No significant atheromatous change at the bifurcation. Cervical ICA widely patent on the LEFT. Both vertebral arteries are tortuous. They are equal in size. No definite ostial lesion. There may be a 75% stenosis of the proximal RIGHT vertebral 2 cm above its origin. MRA HEAD FINDINGS Mild non stenotic irregularity of the cavernous and supraclinoid ICA segments on the RIGHT. 50% smooth stenosis of the cavernous and supraclinoid ICA segments on the LEFT. No proximal stenosis of the anterior or middle cerebral arteries. No MCA branch occlusion. No visible intracranial aneurysm. Basilar artery is widely patent. Both vertebrals contribute equally to its formation. There is no significant proximal PCA disease or cerebellar branch occlusion. IMPRESSION: 50% stenosis of the cavernous and supraclinoid ICA segments on the LEFT, non flow reducing. No significant intracranial posterior circulation disease. No extracranial carotid  bifurcation or cervical ICA lesion. Proximal RIGHT common carotid artery and proximal RIGHT vertebral artery as described above. Electronically Signed   By: Staci Righter M.D.   On: 08/25/2015 09:06   Mr Angiogram Neck W Wo Contrast  08/25/2015  CLINICAL DATA:  Continued surveillance of acute posterior circulation infarct. EXAM: MRA NECK WITHOUT AND WITH CONTRAST MRA HEAD WITHOUT CONTRAST TECHNIQUE: Multiplanar and multiecho pulse sequences of the neck were obtained without and with intravenous contrast. Angiographic images of the neck were obtained using MRA technique without and with intravenous contrast.; Angiographic images of the Circle of Willis were obtained using MRA technique without intravenous contrast. CONTRAST:  45mL MULTIHANCE GADOBENATE DIMEGLUMINE 529 MG/ML IV SOLN COMPARISON:  MR brain 08/24/2015. FINDINGS: MRA NECK FINDINGS Dolichoectasia proximal vasculature. Conventional branching of the great vessels from the arch. The proximal RIGHT common carotid artery demonstrates a estimated 75% stenosis. No flow-limiting disease at the bifurcation or throughout the course of the visualized RIGHT cervical ICA. Poorly visualized proximal LEFT common carotid artery without definite proximal narrowing. No significant atheromatous change at the bifurcation. Cervical ICA widely patent on the LEFT. Both vertebral arteries are tortuous. They are equal in size. No definite ostial lesion. There may be a 75% stenosis of the proximal RIGHT vertebral 2 cm above its origin. MRA HEAD FINDINGS Mild non stenotic irregularity of the cavernous and supraclinoid ICA segments on the RIGHT. 50% smooth stenosis of the cavernous and supraclinoid ICA segments on the LEFT. No proximal stenosis of the anterior or middle cerebral arteries. No MCA branch occlusion. No visible intracranial aneurysm. Basilar artery is widely patent. Both vertebrals contribute equally to its formation. There is no significant proximal PCA disease or  cerebellar branch occlusion. IMPRESSION: 50% stenosis of the cavernous and supraclinoid ICA segments on the LEFT, non flow reducing. No significant intracranial posterior circulation disease. No extracranial carotid bifurcation or cervical ICA lesion. Proximal RIGHT common carotid artery and proximal RIGHT vertebral artery as described above. Electronically Signed   By: Staci Righter M.D.   On: 08/25/2015 09:06   Mr Brain Wo Contrast  08/24/2015  CLINICAL DATA:  Ataxia with nausea and vomiting since yesterday. Assessment for stroke. EXAM: MRI HEAD WITHOUT CONTRAST TECHNIQUE:  Multiplanar, multiecho pulse sequences of the brain and surrounding structures were obtained without intravenous contrast. COMPARISON:  07/04/2014 FINDINGS: There is a 4 mm acute infarct in the medial aspect of the right middle cerebellar peduncle. No intracranial hemorrhage, mass, midline shift, or extra-axial fluid collection is identified. Small chronic infarcts are seen in the pons and thalami. A chronic infarct in the posterior limb of the internal capsule was acute on the prior MRI. There is new wallerian degeneration along the left corticospinal tract in the cerebral peduncle. There is mild cerebral atrophy which is unchanged. Foci of T2 hyperintensity throughout the cerebral white matter and pons are similar to the prior MRI and nonspecific but compatible with moderate chronic small vessel ischemic disease. Prior bilateral cataract extraction is noted. There is a trace right mastoid effusion, and there is minimal left anterior ethmoid air cell mucosal thickening. Major intracranial vascular flow voids are preserved. IMPRESSION: 1. Punctate acute infarct in the right middle cerebellar peduncle. 2. Moderate chronic small vessel ischemic changes as above. Electronically Signed   By: Logan Bores M.D.   On: 08/24/2015 13:18   Dg Abd Portable 1v  08/24/2015  CLINICAL DATA:  Nausea vomiting and vertigo, 2 days duration. EXAM: PORTABLE  ABDOMEN - 1 VIEW COMPARISON:  None. FINDINGS: The bowel gas pattern is normal. No radio-opaque calculi or other significant radiographic abnormality are seen. IMPRESSION: Negative. Electronically Signed   By: Andreas Newport M.D.   On: 08/24/2015 20:11    Assessment/Plan: Diagnosis: punctate infarct in right middle cerebral peduncle Labs and images independently reviewed.  Records reviewed and summated above. Stroke: Continue secondary stroke prophylaxis and Risk Factor Modification listed below:   Antiplatelet therapy:   Blood Pressure Management:  Continue current medication with prn's with permisive HTN per primary team Statin Agent:   Diabetes management:   Right sided hemiparesis Motor recovery: Fluoxetine  1. Does the need for close, 24 hr/day medical supervision in concert with the patient's rehab needs make it unreasonable for this patient to be served in a less intensive setting? Yes  2. Co-Morbidities requiring supervision/potential complications: HTN (monitor and provide prns in accordance with increased physical exertion and pain), T2DM (Monitor in accordance with exercise and adjust meds as necessary), CAD (cont meds), breast cancer s/p partial mastectomy with node biopsy 07/17/15 (to start XRT next week), AKI on ?CKD (avoid nephrotoxic meds), ABLA (transfuse if necessary to ensure appropriate perfusion for increased activity tolerance), dysphagia (continue SLP, advance diet as tolerated, consider vital stim) 3. Due to safety, disease management, medication administration and patient education, does the patient require 24 hr/day rehab nursing? Yes 4. Does the patient require coordinated care of a physician, rehab nurse, PT (1-2 hrs/day, 5 days/week), OT (1-2 hrs/day, 5 days/week) and SLP (1-2 hrs/day, 5 days/week) to address physical and functional deficits in the context of the above medical diagnosis(es)? Yes Addressing deficits in the following areas: balance, endurance,  locomotion, strength, transferring, toileting, speech, swallowing and psychosocial support 5. Can the patient actively participate in an intensive therapy program of at least 3 hrs of therapy per day at least 5 days per week? Yes 6. The potential for patient to make measurable gains while on inpatient rehab is excellent 7. Anticipated functional outcomes upon discharge from inpatient rehab are supervision and min assist  with PT, supervision with OT, modified independent with SLP. 8. Estimated rehab length of stay to reach the above functional goals is: 16-19 days 9. Does the patient have adequate social supports and  living environment to accommodate these discharge functional goals? Yes 10. Anticipated D/C setting: Home 11. Anticipated post D/C treatments: HH therapy and Home excercise program 12. Overall Rehab/Functional Prognosis: good  RECOMMENDATIONS: This patient's condition is appropriate for continued rehabilitative care in the following setting: CIR Patient has agreed to participate in recommended program. Yes Note that insurance prior authorization may be required for reimbursement for recommended care.  Comment: Rehab Admissions Coordinator to follow up.  Delice Lesch, MD 08/25/2015

## 2015-08-25 NOTE — Progress Notes (Signed)
BP in the 200's even after PRN Labetalol given. MD paged. Patient resting comfortably this evening. No complaints of nausea or vomiting. Continuing to monitor. Janaisa Birkland, Rande Brunt, RN

## 2015-08-25 NOTE — Progress Notes (Signed)
Inpatient Diabetes Program Recommendations  AACE/ADA: New Consensus Statement on Inpatient Glycemic Control (2015)  Target Ranges:  Prepandial:   less than 140 mg/dL      Peak postprandial:   less than 180 mg/dL (1-2 hours)      Critically ill patients:  140 - 180 mg/dL   Spoke with patient about diabetes and home regimen for diabetes control. Patient reports that she is followed by Dr. Buddy Duty [facility]) for DM management and had an appointment last month and has a follow up appointment this month. Spoke with patient about sick day guidelines and taking her insulin. Patient has had poor po intake. Discussed hypoglycemia s/s and treatment. Spoke to patient about speaking with Dr. Buddy Duty about her admission and getting him to make insulin adjustments. Discussed how DM affects circulation and to prevent acute and chronic complications, her goal would be to decrease her A1c level quick and keep it down. Discussed A1C and explained that her current A1c is pending. Discussed carbohydrates, carbohydrate goals per day and meal, along with portion sizes. Patient verbalized understanding of information discussed and has no further questions at this time related to diabetes.  Thanks, Tama Headings RN, MSN, [facility] Inpatient Diabetes Coordinator Team Pager [phone] (8a-5p)

## 2015-08-25 NOTE — Progress Notes (Signed)
   08/25/15 0200 08/25/15 0202  Vitals  BP (!) 192/60 mmHg (!) 190/58 mmHg  BP Location Right Arm Right Arm  BP Method Automatic Automatic  Patient Position (if appropriate) Lying Lying  Pulse Rate 67 65  Pulse Rate Source Dinamap Dinamap   RN notifying on-call neurologist and will continue to monitor patient.

## 2015-08-25 NOTE — Evaluation (Signed)
Occupational Therapy Evaluation Patient Details Name: Glenda [name] MRN: [id number] DOB: 1934-02-05 Today's Date: 08/25/2015    History of Present Illness 82 y.o. admitted for vertigo and nausea/vomiting. MRI revealed Punctate acute infarct in the right middle cerebellar peduncle. PMH includes GERD, obesity, DM, HTN, HLD, CAD, UTI, chest pain, breast cancer s/p surgery, arthritis, stroke, dizziness, SOB/dyspnea.   Clinical Impression   Pt admitted with above. Pt independent with ADLs, PTA. Feel pt will benefit from acute OT increase independence prior to d/c. Pt nauseous in session. Recommending CIR for rehab.     Follow Up Recommendations  CIR    Equipment Recommendations  Other (comment) (defer to next venue)    Recommendations for Other Services       Precautions / Restrictions Precautions Precautions: Fall Restrictions Weight Bearing Restrictions: No      Mobility Bed Mobility Overal bed mobility: Needs Assistance Bed Mobility: Supine to Sit;Sit to Supine     Supine to sit: Supervision Sit to supine: Mod assist   General bed mobility comments: assist given to return to bed and assist to scoot HOB.   Transfers Overall transfer level: Needs assistance   Transfers: Sit to/from Stand Sit to Stand: Mod assist              Balance      Unsteady while standing. Decreased sitting balance sitting EOB.                                       ADL Overall ADL's : Needs assistance/impaired     Grooming: Wash/dry face;Supervision/safety;Set up;Sitting               Lower Body Dressing: Moderate assistance;Sit to/from stand   Toilet Transfer: Moderate assistance (sit to stand from bed)           Functional mobility during ADLs:  (Mod assist for sit to stand from bed) General ADL Comments: Pt sat EOB and pt nauseous.     Vision Pt wears glasses Vision Assessment?: Yes Visual Fields:  inconsistent (tested with glasses on)    Perception     Praxis      Pertinent Vitals/Pain Pain Assessment: No/denies pain     Hand Dominance     Extremity/Trunk Assessment Upper Extremity Assessment Upper Extremity Assessment: Generalized weakness;RUE deficits/detail;LUE deficits/detail RUE Deficits / Details: limited ROM right shoulder flexion-recent breast surgery; weakness in shoulder flexors; reports previous stroke affecting Rt UE LUE Deficits / Details: limited AROM shoulder flexion; weakness in shoulder flexors   Lower Extremity Assessment Lower Extremity Assessment: Defer to PT evaluation       Communication Communication Communication: No difficulties   Cognition Arousal/Alertness: Awake/alert Behavior During Therapy: WFL for tasks assessed/performed;Flat affect Overall Cognitive Status: Within Functional Limits for tasks assessed                     General Comments       Exercises       Shoulder Instructions      Home Living Family/patient expects to be discharged to:: Unsure Living Arrangements: Children Available Help at Discharge: Family;Available PRN/intermittently Type of Home: House Home Access: Stairs to enter (reports she is getting ramp soon) Entrance Stairs-Number of Steps: 3 Entrance Stairs-Rails: None Home Layout: One level     Bathroom Shower/Tub: Teacher, early years/pre: Standard     Home Equipment: Environmental consultant - 2 wheels;Cane -  single point          Prior Functioning/Environment Level of Independence: Independent             OT Diagnosis: Generalized weakness   OT Problem List: Decreased strength;Decreased range of motion;Decreased activity tolerance;Impaired balance (sitting and/or standing);Impaired vision/perception;Decreased knowledge of use of DME or AE   OT Treatment/Interventions: Self-care/ADL training;Therapeutic exercise;DME and/or AE instruction;Therapeutic activities;Visual/perceptual remediation/compensation;Patient/family  education;Balance training    OT Goals(Current goals can be found in the care plan section) Acute Rehab OT Goals Patient Stated Goal: not stated OT Goal Formulation: With patient Time For Goal Achievement: 09/01/15 Potential to Achieve Goals: Good ADL Goals Pt Will Perform Grooming: with min guard assist;standing (3 tasks) Pt Will Perform Lower Body Bathing: with min guard assist;sit to/from stand Pt Will Perform Lower Body Dressing: with min guard assist;sit to/from stand Pt Will Transfer to Toilet: ambulating;bedside commode;with min assist Pt Will Perform Toileting - Clothing Manipulation and hygiene: with min guard assist;sit to/from stand  OT Frequency: Min 2X/week   Barriers to D/C:            Co-evaluation              End of Session Equipment Utilized During Treatment: Gait belt  Activity Tolerance: Other (comment) (nauseous) Patient left: with call bell/phone within reach;with bed alarm set;in bed;with family/visitor present   Time: [id number] OT Time Calculation (min): 15 min Charges:  OT General Charges $OT Visit: 1 Procedure OT Evaluation $OT Eval Moderate Complexity: 1 Procedure G-CodesBenito Mccreedy OTR/L [id number] 08/25/2015, 12:49 PM

## 2015-08-25 NOTE — Progress Notes (Signed)
Rehab Admissions Coordinator Note:  Patient was screened by Cleatrice Burke for appropriateness for an Inpatient Acute Rehab Consult per OT recommendation.  At this time, we are recommending Inpatient Rehab consult. Please place order.   Cleatrice Burke 08/25/2015, 1:13 PM  I can be reached at [phone].

## 2015-08-25 NOTE — Evaluation (Signed)
Physical Therapy Evaluation Patient Details Name: Glenda [name] MRN: [id number] DOB: 07/09/1933 Today's Date: 08/25/2015   History of Present Illness  82 y.o. admitted for vertigo and nausea/vomiting. MRI revealed Punctate acute infarct in the right middle cerebellar peduncle. PMH includes GERD, obesity, DM, HTN, HLD, CAD, UTI, chest pain, breast cancer s/p surgery, arthritis, stroke, dizziness, SOB/dyspnea.  Clinical Impression  Patient presents with ataxia, dizziness, nausea/vomiting and balance deficits s/p CVA impacting mobility. Tolerated SPT and gait training with Mod A for support. Ambulation distance limited due to dizziness/emesis. Pt has very supportive family. Would be a great rehab candidate. Will follow acutely to maximize independence and mobility while in hospital.     Follow Up Recommendations CIR    Equipment Recommendations  Other (comment) (TBD)    Recommendations for Other Services Rehab consult     Precautions / Restrictions Precautions Precautions: Fall Precaution Comments: ataxia Restrictions Weight Bearing Restrictions: No      Mobility  Bed Mobility Overal bed mobility: Needs Assistance Bed Mobility: Supine to Sit     Supine to sit: Min guard;HOB elevated Sit to supine: Mod assist   General bed mobility comments: Increased time to get to EOB, no assist needed. Cues for technique. + dizziness.   Transfers Overall transfer level: Needs assistance Equipment used: Rolling walker (2 wheeled);None Transfers: Sit to/from [facility] Sit to Stand: Min assist;Mod assist Stand pivot transfers: Mod assist       General transfer comment: Min-Mod A to stand from different surfaces with ataxia present upon standing. + dizziness. SPT bed to/from [facility] Mod A for balance. Uncontrolled descent onto BSC/bed. Transferred to chair post ambulation bout.  Ambulation/Gait Ambulation/Gait assistance: Mod assist Ambulation Distance (Feet): 6  Feet Assistive device: Rolling walker (2 wheeled) Gait Pattern/deviations: Ataxic;Leaning posteriorly Gait velocity: decreased   General Gait Details: Cues for balance as pt with truncal and LE ataxia on right side. Right knee instability noted as well. + dizziness, + emesis limiting distance.   Stairs            Wheelchair Mobility    Modified Rankin (Stroke Patients Only) Modified Rankin (Stroke Patients Only) Pre-Morbid Rankin Score: No symptoms Modified Rankin: Moderately severe disability     Balance Overall balance assessment: Needs assistance Sitting-balance support: Feet supported;No upper extremity supported Sitting balance-Leahy Scale: Fair Sitting balance - Comments: Imbalance noted after mobility sitting EOB (due to dizziness) - noted by sway requiring Min A-Min guard for support. Nystagmus noted.   Standing balance support: During functional activity;Bilateral upper extremity supported Standing balance-Leahy Scale: Poor Standing balance comment: Reilant on BUEs and Min-Mod A for static and dynamic standing balance.                              Pertinent Vitals/Pain Pain Assessment: No/denies pain    Home Living Family/patient expects to be discharged to:: Unsure Living Arrangements: Children Available Help at Discharge: Family;Available PRN/intermittently Type of Home: House Home Access: Stairs to enter (getting ramp soon) Entrance Stairs-Rails: None Entrance Stairs-Number of Steps: 3 Home Layout: One level Home Equipment: Walker - 2 wheels;Cane - single point      Prior Function Level of Independence: Independent               Hand Dominance   Dominant Hand: Right    Extremity/Trunk Assessment   Upper Extremity Assessment: Defer to OT evaluation RUE Deficits / Details: limited ROM right shoulder flexion-recent breast surgery;  weakness in shoulder flexors; reports previous stroke affecting Rt UE     LUE Deficits / Details:  limited AROM shoulder flexion; weakness in shoulder flexors   Lower Extremity Assessment: RLE deficits/detail RLE Deficits / Details: Weakness present during functional mobility; right knee instability.       Communication   Communication: No difficulties  Cognition Arousal/Alertness: Awake/alert Behavior During Therapy: Flat affect Overall Cognitive Status: Within Functional Limits for tasks assessed (delayed processing at times.)                      General Comments General comments (skin integrity, edema, etc.): Daughter present during session.    Exercises        Assessment/Plan    PT Assessment Patient needs continued PT services  PT Diagnosis Difficulty walking;Abnormality of gait   PT Problem List Decreased strength;Decreased coordination;Decreased balance;Decreased knowledge of use of DME;Decreased mobility;Decreased knowledge of precautions  PT Treatment Interventions Balance training;Gait training;Therapeutic activities;Therapeutic exercise;Patient/family education;Functional mobility training;Stair training;Neuromuscular re-education;DME instruction   PT Goals (Current goals can be found in the Care Plan section) Acute Rehab PT Goals Patient Stated Goal: to return to independence PT Goal Formulation: With patient/family Time For Goal Achievement: 09/08/15 Potential to Achieve Goals: Fair    Frequency Min 4X/week   Barriers to discharge Inaccessible home environment steps    Co-evaluation               End of Session Equipment Utilized During Treatment: Gait belt Activity Tolerance: Treatment limited secondary to medical complications (Comment) (nausea/vomiting) Patient left: in chair;with call bell/phone within reach;with chair alarm set;with family/visitor present Nurse Communication: Mobility status         Time: 1317-1340 PT Time Calculation (min) (ACUTE ONLY): 23 min   Charges:   PT Evaluation $PT Eval Moderate Complexity: 1  Procedure PT Treatments $Gait Training: 8-22 mins   PT G Codes:        Heather Streeper A Daiana Vitiello 08/25/2015, 2:24 PM Wray Kearns, [place], DPT [phone]

## 2015-08-25 NOTE — Care Management Note (Signed)
Case Management Note  Patient Details  Name: Glenda [name] MRN: [id number] Date of Birth: 05-05-1933  Subjective/Objective:                    Action/Plan: Patient was admitted with CVA. Lives at home alone. Will follow for discharge needs pending PT/OT evals and physician orders.  Expected Discharge Date:                  Expected Discharge Plan:     In-House Referral:     Discharge planning Services     Post Acute Care Choice:    Choice offered to:     DME Arranged:    DME Agency:     HH Arranged:    HH Agency:     Status of Service:  In process, will continue to follow  Medicare Important Message Given:    Date Medicare IM Given:    Medicare IM give by:    Date Additional Medicare IM Given:    Additional Medicare Important Message give by:     If discussed at [place] of Stay Meetings, dates discussed:    Additional CommentsRolm Baptise, RN 08/25/2015, 11:13 AM [phone]

## 2015-08-25 NOTE — Progress Notes (Signed)
Spoke with Dr. Nicole Kindred 10 mg labetalol IV every 2 hours PRN for systolic greater than 0000000. RN will administer and continue to monitor patient.

## 2015-08-25 NOTE — Progress Notes (Signed)
STROKE TEAM PROGRESS NOTE   HISTORY OF PRESENT ILLNESS Glenda [name] is a 82 y.o. female who has been complaining of vertigo and nausea/vomiting for the past couple of days. She stated that it started on Friday 08/22/2015 and has been persistent. Due to no improvement, she sought care in the emergency department where an MRI was performed which does show punctate cerebellar stroke. Patient was not administered IV t-PA secondary to being outside of the window. She was admitted for further evaluation and treatment.   SUBJECTIVE (INTERVAL HISTORY) Her family is at the bedside.  Overall she feels her condition is stable. She reported she took asa 81 mg daily at home. Dr. Leonie Man discussed diagnosis, plan of care with patient and her family.   OBJECTIVE Temp:  [98.1 F (36.7 C)-98.5 F (36.9 C)] 98.5 F (36.9 C) (05/01 1015) Pulse Rate:  [29-82] 82 (05/01 1015) Cardiac Rhythm:  [-] Normal sinus rhythm (05/01 0209) Resp:  [14-20] 20 (05/01 1015) BP: (142-208)/(49-126) 195/59 mmHg (05/01 1015) SpO2:  [96 %-100 %] 98 % (05/01 1015) Weight:  [71.3 kg (157 lb 3 oz)] 71.3 kg (157 lb 3 oz) (05/01 0500)  CBC:   Recent Labs Lab 08/24/15 0900  WBC 5.7  HGB 11.5*  HCT 35.6*  MCV 81.7  PLT [id number]   Basic Metabolic Panel:   Recent Labs Lab 08/24/15 0900  NA 145  K 3.9  CL 111  CO2 25  GLUCOSE 241*  BUN 28*  CREATININE 1.42*  CALCIUM 9.3   Lipid Panel:     Component Value Date/Time   CHOL 151 08/25/2015 0353   TRIG 168* 08/25/2015 0353   HDL 47 08/25/2015 0353   CHOLHDL 3.2 08/25/2015 0353   VLDL 34 08/25/2015 0353   LDLCALC 70 08/25/2015 0353   HgbA1c:  Lab Results  Component Value Date   HGBA1C 13.1* 07/05/2014   Urine Drug Screen: No results found for: LABOPIA, COCAINSCRNUR, LABBENZ, AMPHETMU, THCU, LABBARB    IMAGING  Mr Jodene Nam Head Wo Contrast  08/25/2015  CLINICAL DATA:  Continued surveillance of acute posterior circulation infarct. EXAM: MRA NECK WITHOUT AND WITH  CONTRAST MRA HEAD WITHOUT CONTRAST TECHNIQUE: Multiplanar and multiecho pulse sequences of the neck were obtained without and with intravenous contrast. Angiographic images of the neck were obtained using MRA technique without and with intravenous contrast.; Angiographic images of the Circle of Willis were obtained using MRA technique without intravenous contrast. CONTRAST:  79mL MULTIHANCE GADOBENATE DIMEGLUMINE 529 MG/ML IV SOLN COMPARISON:  MR brain 08/24/2015. FINDINGS: MRA NECK FINDINGS Dolichoectasia proximal vasculature. Conventional branching of the great vessels from the arch. The proximal RIGHT common carotid artery demonstrates a estimated 75% stenosis. No flow-limiting disease at the bifurcation or throughout the course of the visualized RIGHT cervical ICA. Poorly visualized proximal LEFT common carotid artery without definite proximal narrowing. No significant atheromatous change at the bifurcation. Cervical ICA widely patent on the LEFT. Both vertebral arteries are tortuous. They are equal in size. No definite ostial lesion. There may be a 75% stenosis of the proximal RIGHT vertebral 2 cm above its origin. MRA HEAD FINDINGS Mild non stenotic irregularity of the cavernous and supraclinoid ICA segments on the RIGHT. 50% smooth stenosis of the cavernous and supraclinoid ICA segments on the LEFT. No proximal stenosis of the anterior or middle cerebral arteries. No MCA branch occlusion. No visible intracranial aneurysm. Basilar artery is widely patent. Both vertebrals contribute equally to its formation. There is no significant proximal PCA disease or cerebellar branch occlusion.  IMPRESSION: 50% stenosis of the cavernous and supraclinoid ICA segments on the LEFT, non flow reducing. No significant intracranial posterior circulation disease. No extracranial carotid bifurcation or cervical ICA lesion. Proximal RIGHT common carotid artery and proximal RIGHT vertebral artery as described above. Electronically  Signed   By: Staci Righter M.D.   On: 08/25/2015 09:06   Mr Angiogram Neck W Wo Contrast  08/25/2015  CLINICAL DATA:  Continued surveillance of acute posterior circulation infarct. EXAM: MRA NECK WITHOUT AND WITH CONTRAST MRA HEAD WITHOUT CONTRAST TECHNIQUE: Multiplanar and multiecho pulse sequences of the neck were obtained without and with intravenous contrast. Angiographic images of the neck were obtained using MRA technique without and with intravenous contrast.; Angiographic images of the Circle of Willis were obtained using MRA technique without intravenous contrast. CONTRAST:  70mL MULTIHANCE GADOBENATE DIMEGLUMINE 529 MG/ML IV SOLN COMPARISON:  MR brain 08/24/2015. FINDINGS: MRA NECK FINDINGS Dolichoectasia proximal vasculature. Conventional branching of the great vessels from the arch. The proximal RIGHT common carotid artery demonstrates a estimated 75% stenosis. No flow-limiting disease at the bifurcation or throughout the course of the visualized RIGHT cervical ICA. Poorly visualized proximal LEFT common carotid artery without definite proximal narrowing. No significant atheromatous change at the bifurcation. Cervical ICA widely patent on the LEFT. Both vertebral arteries are tortuous. They are equal in size. No definite ostial lesion. There may be a 75% stenosis of the proximal RIGHT vertebral 2 cm above its origin. MRA HEAD FINDINGS Mild non stenotic irregularity of the cavernous and supraclinoid ICA segments on the RIGHT. 50% smooth stenosis of the cavernous and supraclinoid ICA segments on the LEFT. No proximal stenosis of the anterior or middle cerebral arteries. No MCA branch occlusion. No visible intracranial aneurysm. Basilar artery is widely patent. Both vertebrals contribute equally to its formation. There is no significant proximal PCA disease or cerebellar branch occlusion. IMPRESSION: 50% stenosis of the cavernous and supraclinoid ICA segments on the LEFT, non flow reducing. No significant  intracranial posterior circulation disease. No extracranial carotid bifurcation or cervical ICA lesion. Proximal RIGHT common carotid artery and proximal RIGHT vertebral artery as described above. Electronically Signed   By: Staci Righter M.D.   On: 08/25/2015 09:06   Mr Brain Wo Contrast  08/24/2015  CLINICAL DATA:  Ataxia with nausea and vomiting since yesterday. Assessment for stroke. EXAM: MRI HEAD WITHOUT CONTRAST TECHNIQUE: Multiplanar, multiecho pulse sequences of the brain and surrounding structures were obtained without intravenous contrast. COMPARISON:  07/04/2014 FINDINGS: There is a 4 mm acute infarct in the medial aspect of the right middle cerebellar peduncle. No intracranial hemorrhage, mass, midline shift, or extra-axial fluid collection is identified. Small chronic infarcts are seen in the pons and thalami. A chronic infarct in the posterior limb of the internal capsule was acute on the prior MRI. There is new wallerian degeneration along the left corticospinal tract in the cerebral peduncle. There is mild cerebral atrophy which is unchanged. Foci of T2 hyperintensity throughout the cerebral white matter and pons are similar to the prior MRI and nonspecific but compatible with moderate chronic small vessel ischemic disease. Prior bilateral cataract extraction is noted. There is a trace right mastoid effusion, and there is minimal left anterior ethmoid air cell mucosal thickening. Major intracranial vascular flow voids are preserved. IMPRESSION: 1. Punctate acute infarct in the right middle cerebellar peduncle. 2. Moderate chronic small vessel ischemic changes as above. Electronically Signed   By: Logan Bores M.D.   On: 08/24/2015 13:18   Dg Abd  Portable 1v  08/24/2015  CLINICAL DATA:  Nausea vomiting and vertigo, 2 days duration. EXAM: PORTABLE ABDOMEN - 1 VIEW COMPARISON:  None. FINDINGS: The bowel gas pattern is normal. No radio-opaque calculi or other significant radiographic abnormality  are seen. IMPRESSION: Negative. Electronically Signed   By: Andreas Newport M.D.   On: 08/24/2015 20:11    PHYSICAL EXAM Pleasant elderly lady not in distress. . Afebrile. Head is nontraumatic. Neck is supple without bruit.    Cardiac exam no murmur or gallop. Lungs are clear to auscultation. Distal pulses are well felt. Neurological Exam ;  Awake  Alert oriented x 3. Normal speech and language.eye movements full without nystagmus.fundi were not visualized. Vision acuity and fields appear normal. Hearing is normal. Palatal movements are normal. Face symmetric. Tongue midline. Normal strength, tone, reflexes and coordination except mild right sided finger to nose and knee to heel dysmetria. Normal sensation. Gait deferred. ASSESSMENT/PLAN [name] is a 82 y.o. female with history of coronary artery disease, hypertension, diabetes, recent diagnosis of DCIS presenting with vertigo. She did not receive IV t-PA due to delay in arrival.   Stroke:  Punctate Right middle cerebellar peduncle infarct secondary to small vessel disease    Resultant  Nausea and vomiting, ataxia   MRI  Punctate Right middle cerebellar peduncle infarct  MRA head No significant intracranial posterior circulation disease. L ICA 50% stenosis non flow reducing.   MRA  Neck No extracranial carotid bifurcation or cervical ICA lesion.   2D Echo  pending   LDL 70  HgbA1c pending  Lovenox 40 mg sq daily for VTE prophylaxis DIET DYS 2 Room service appropriate?: Yes; Fluid consistency:: Thin  aspirin 81 mg daily and clopidogrel 75 mg daily prior to admission but had stopped plavix in January for a procedure, now on aspirin 325 mg daily and clopidogrel 75 mg daily (NO STENT). Continue plavix alone  Patient counseled to be compliant with her antithrombotic medications  Ongoing aggressive stroke risk factor management  Therapy recommendations:  CIR. Consult in place  Disposition:  pending   Hypertensive  emergency  Blood pressure as high as 208/70, 202/100 on admission setting of neurologic symptoms  Remains elevated. Home medications resumed  Permissive hypertension (OK if < 220/120) but gradually normalize in 5-7 days  Hyperlipidemia  Home meds:  No statin  LDL 70, goal < 70  Added zocor  Continue statin at discharge  Diabetes type I, uncontrolled, insulin-dependent  HgbA1c pending, goal < 7.0  Other Stroke Risk Factors  Advanced age  Overweight, Body mass index is 25.38 kg/(m^2).   Hx stroke/TIA  06/2014 Left thalamic and posterior limb internal capsule lacunar infarct, History of peripheral vision loss ([place])  Coronary artery disease - medical management. No stent  Other Active Problems  Right breast cancer s/p right breast seed localized partial mastectomy with right axillary sentinel lymph node mapping 3/23.  Cr 1.42  Hospital day # 1  Radene Journey [facility] for Pager information 08/25/2015 3:30 PM  I have personally examined this patient, reviewed notes, independently viewed imaging studies, participated in medical decision making and plan of care. I have made any additions or clarifications directly to the above note. Agree with note above.  She presented with a punctate right cerebellar infarct likely due to small vessel disease. She is at risk for recurrent stroke, TIA, neurological worsening and needs ongoing stroke evaluation.  Greater than 50% of time during this 25 minute visit was spent  on counseling and coordination of care about her stroke risk. Antony Contras, MD Medical Director [facility] Pager: [phone] 08/25/2015 5:26 PM    To contact Stroke Continuity provider, please refer to [email or web address] After hours, contact General Neurology

## 2015-08-25 NOTE — Progress Notes (Signed)
PROGRESS NOTE  Glenda [name] [id number] DOB: 1933/08/23 DOA: 08/24/2015 PCP: Donnie Coffin, MD  Brief Narrative: 82 year old woman presented with nausea and vomiting and dizziness. MRI of the brain revealed punctate acute infarcts right middle cerebral peduncle. Neurology evaluated and recommended stroke evaluation.  Assessment/Plan: 1. Acute cerebellar infarct with associated vomiting. LDL 70. 2. Vomiting secondary to acute infarct. Abdominal film negative. Improving. Secondary to stroke. 3. Hypertension, reports poor controls outpatient 4. Diabetes mellitus on insulin, stable 5. Breast cancer upper outer quadrant right, s/p right breast seed localized partial mastectomy with right axillary sentinel lymph node mapping 3/23. 6. Coronary artery disease, stable 7. Chronic kidney disease stage III versus acute kidney injury.   Somewhat improved symptomatically although still some vomiting. Stroke team following. Continue aspirin, Plavix; follow-up echocardiogram and carotid ultrasound, hemoglobin A1c, therapy evaluations.  Antiemetic, supportive care, IVF, repeat BMP in AM  DVT prophylaxis: Lovenox Code Status: full code Family Communication: son Jenny Reichmann, daughter at bedside, additional daughter by telephone Disposition Plan: Pending therapy evaluations, likely next 1-2 days  Murray Hodgkins, MD  [facility] Direct contact:  --[place] [place]  --[email or web address]; password [email or web address] and click  [email or web address] contact night coverage as above 08/25/2015, 9:47 AM  LOS: 1 day   Consultants:  Neurology  Procedures:    Antimicrobials:    HPI/Subjective: Feels better. Some vomiting this morning but overall feels much improved. No pain. Dizziness improved as well. No other neurologic complaints. Arms and legs to be doing well.  Objective: Filed Vitals:   08/25/15 0225 08/25/15 0342 08/25/15 0402 08/25/15 0500  BP: 194/62 199/68 189/61 184/64  Pulse:  75 77 79  Temp:      TempSrc:       Resp:  16 14 16   Height:    5\' 6"  (1.676 m)  Weight:    71.3 kg (157 lb 3 oz)  SpO2:  98% 96% 99%    Intake/Output Summary (Last 24 hours) at 08/25/15 0947 Last data filed at 08/25/15 0600  Gross per 24 hour  Intake      0 ml  Output    675 ml  Net   -675 ml     Filed Weights   08/25/15 0500  Weight: 71.3 kg (157 lb 3 oz)    Exam:    Constitutional:  . Appears calm and comfortable Eyes:  . PERRL and irises appear normal ENMT:  . Mild hearing loss Respiratory:  . CTA bilaterally, no w/r/r.  . Respiratory effort normal. No retractions or accessory muscle use Cardiovascular:  . RRR, no m/r/g . No LE extremity edema   Musculoskeletal:  . LUE, LLE   o strength and tone normal, no atrophy, no abnormal movements o No tenderness, masses o RUE, RLE o Tone normal. Strength seems diminished 4+/5, no atrophy, no abnormal movements Neurologic:  . CN 2-12 intact . Dysdiadochokinesis right upper extremity . No pronator drift Psychiatric:  . judgement and insight appear normal . Mental status o Mood, affect appropriate  I have personally reviewed following labs and imaging studies:  LDL 70  Creatinine 1.42, improved compared to March  Hepatic function panels unremarkable  Troponin was negative on admission  CBC unremarkable  MRI brain, MRA as reviewed  Abdominal film was unremarkable on admission  Scheduled Meds: .  stroke: mapping our early stages of recovery book   Does not apply Once  . aspirin EC  325 mg Oral Daily   Or  . aspirin  300 mg Rectal Daily  .  clopidogrel  75 mg Oral Daily  . enoxaparin (LOVENOX) injection  40 mg Subcutaneous Q24H  . furosemide  80 mg Oral Daily  . irbesartan  300 mg Oral Daily   And  . hydrochlorothiazide  25 mg Oral Daily  . insulin aspart  0-15 Units Subcutaneous TID WC  . insulin aspart  0-5 Units Subcutaneous QHS  . insulin glargine  24 Units Subcutaneous q morning - 10a  . metoCLOPramide (REGLAN) injection  5  mg Intravenous Q6H  . nebivolol  20 mg Oral To ER  . pantoprazole  40 mg Oral Daily  . potassium chloride  10 mEq Oral Daily   Continuous Infusions: . sodium chloride 100 mL/hr at 08/24/15 2108    Principal Problem:   Acute ischemic stroke/right cerebellar Active Problems:   Diabetes mellitus type 1, uncontrolled, insulin dependent ([place])   Uncontrolled hypertension   CAD (coronary artery disease)   Hyperlipidemia   Breast cancer of upper-outer quadrant of right female breast ([place])   LOS: 1 day   Time spent 20 minutes

## 2015-08-25 NOTE — Evaluation (Addendum)
Clinical/Bedside Swallow Evaluation Patient Details  Name: Glenda [name] MRN: [id number] Date of Birth: 1934/03/12  Today's Date: 08/25/2015 Time: SLP Start Time (ACUTE ONLY): 1037 SLP Stop Time (ACUTE ONLY): 1100 SLP Time Calculation (min) (ACUTE ONLY): 23 min  Past Medical History:  Past Medical History  Diagnosis Date  . GERD (gastroesophageal reflux disease)   . Obesity   . DM (diabetes mellitus) ([place])   . HTN (hypertension)   . HLD (hyperlipidemia)   . CAD (coronary artery disease)   . UTI (urinary tract infection)   . Chest pain   . Dizziness   . Breast cancer ([place])   . Arthritis   . Stroke ([place])   . Shortness of breath dyspnea    Past Surgical History:  Past Surgical History  Procedure Laterality Date  . Cholecystectomy    . Colon polyps; hx    . Coronary angioplasty  02/14/09  . Radioactive seed guided mastectomy with axillary sentinel lymph node biopsy Right 07/17/2015    Procedure: RADIOACTIVE SEED GUIDED PARTIAL MASTECTOMY WITH AXILLARY SENTINEL LYMPH NODE BIOPSY;  Surgeon: Erroll Luna, MD;  Location: [facility];  Service: General;  Laterality: Right;   HPI:  Glenda [name] is a 82 y.o. female who has been complaining of vertigo and nausea/vomiting for the past couple of days. She stated that it started on Friday and has been persistent. Due to no improvement, she sought care in the emergency department where an MRI was performed which does show punctate cerebellar stroke.   Assessment / Plan / Recommendation Clinical Impression  Pt referred for clinical assessment of swallow secondary to failing stroke swallow screen. Pt's speech and language appear WFL with informal assessment. Pt positioned upright and trialed with ice chips, thin, applesauce, and cracker. Pt managed all consistencies without s/s aspiration. Mild oral residue noted with dry swallow. A liquid wash was effective to facilitate oral clearance and pt verbalized some awareness of oral residue, but  required min verbal cueing to manage. Discussed supervision with PO intake with pt and her family as well as monitoring for upright positioning and oral clearance. Recommend: Dys 2 diet with thin liquids. Meds whole with thin liquids. Will follow.    Aspiration Risk  Mild aspiration risk    Diet Recommendation Dysphagia 2 (Fine chop);Thin liquid   Liquid Administration via: Cup;Straw Medication Administration: Whole meds with liquid Supervision: Staff to assist with self feeding;Full supervision/cueing for compensatory strategies Compensations: Small sips/bites;Follow solids with liquid Postural Changes: Seated upright at 90 degrees;Remain upright for at least 30 minutes after po intake    Other  Recommendations Oral Care Recommendations: Oral care BID   Follow up Recommendations   (TBD)    Frequency and Duration min 2x/week  1 week       Prognosis Prognosis for Safe Diet Advancement: Good      Swallow Study   General Date of Onset: 08/24/15 HPI: Glenda [name] is a 82 y.o. female who has been complaining of vertigo and nausea/vomiting for the past couple of days. She stated that it started on Friday and has been persistent. Due to no improvement, she sought care in the emergency department where an MRI was performed which does show punctate cerebellar stroke. Type of Study: Bedside Swallow Evaluation Previous Swallow Assessment: none known Diet Prior to this Study: NPO Temperature Spikes Noted: No Respiratory Status: Room air History of Recent Intubation: No Behavior/Cognition: Alert;Cooperative;Pleasant mood Oral Cavity Assessment: Within Functional Limits Oral Cavity - Dentition: Dentures, top;Missing dentition Vision: Functional for  self-feeding Self-Feeding Abilities: Needs assist;Needs set up;Able to feed self Patient Positioning: Upright in bed Baseline Vocal Quality: Normal Volitional Cough: Strong    Oral/Motor/Sensory Function Overall Oral Motor/Sensory  Function: Within functional limits   Ice Chips Ice chips: Within functional limits Presentation: Spoon   Thin Liquid Thin Liquid: Within functional limits Presentation: Cup;Self Fed;Spoon;Straw    Nectar Thick     Honey Thick     Puree Puree: Within functional limits Presentation: Spoon   Solid   GO   Solid: Impaired Presentation: Self Fed Oral Phase Impairments: Impaired mastication Oral Phase Functional Implications: Oral residue        Glenda Bergamo MA, CCC-SLP Pager [phone] 08/25/2015,11:28 AM

## 2015-08-25 NOTE — Progress Notes (Signed)
Patient began vomiting 475 mL dark green, thin emesis. Reglan IV administered. Patient asleep. RN will continue to monitor.

## 2015-08-26 ENCOUNTER — Inpatient Hospital Stay (HOSPITAL_COMMUNITY): Payer: Medicare Other

## 2015-08-26 DIAGNOSIS — I6789 Other cerebrovascular disease: Secondary | ICD-10-CM

## 2015-08-26 LAB — ECHOCARDIOGRAM COMPLETE
Height: 66 in
WEIGHTICAEL: 2515.01 [oz_av]

## 2015-08-26 LAB — BASIC METABOLIC PANEL
Anion gap: 11 (ref 5–15)
BUN: 23 mg/dL — AB (ref 6–20)
CHLORIDE: 110 mmol/L (ref 101–111)
CO2: 23 mmol/L (ref 22–32)
CREATININE: 1.38 mg/dL — AB (ref 0.44–1.00)
Calcium: 9.3 mg/dL (ref 8.9–10.3)
GFR calc Af Amer: 41 mL/min — ABNORMAL LOW (ref >60)
GFR, EST NON AFRICAN AMERICAN: 35 mL/min — AB (ref >60)
GLUCOSE: 194 mg/dL — AB (ref 65–99)
POTASSIUM: 3.2 mmol/L — AB (ref 3.5–5.1)
SODIUM: 144 mmol/L (ref 135–145)

## 2015-08-26 LAB — GLUCOSE, CAPILLARY
GLUCOSE-CAPILLARY: 170 mg/dL — AB (ref 65–99)
Glucose-Capillary: 177 mg/dL — ABNORMAL HIGH (ref 65–99)
Glucose-Capillary: 183 mg/dL — ABNORMAL HIGH (ref 65–99)
Glucose-Capillary: 198 mg/dL — ABNORMAL HIGH (ref 65–99)
Glucose-Capillary: 225 mg/dL — ABNORMAL HIGH (ref 65–99)

## 2015-08-26 LAB — HEMOGLOBIN A1C
HEMOGLOBIN A1C: 10.2 % — AB (ref 4.8–5.6)
Mean Plasma Glucose: 246 mg/dL

## 2015-08-26 MED ORDER — PERFLUTREN LIPID MICROSPHERE
1.0000 mL | INTRAVENOUS | Status: AC | PRN
  Filled 2015-08-26: qty 10

## 2015-08-26 MED ORDER — PERFLUTREN LIPID MICROSPHERE
INTRAVENOUS | Status: AC
  Administered 2015-08-26: 15:00:00
  Filled 2015-08-26: qty 10

## 2015-08-26 NOTE — Progress Notes (Signed)
Speech Language Pathology Treatment: Dysphagia  Patient Details Name: Glenda [name] MRN: [id number] DOB: 1933-04-30 Today's Date: 08/26/2015 Time: [id number] SLP Time Calculation (min) (ACUTE ONLY): 11 min  Assessment / Plan / Recommendation Clinical Impression  Followup dysphagia therapy. Pt was trialed with regular consistency snack which she managed with complete oral clearance and no s/s aspiration. Discussed upgrade to Dys 3 or regular with the pt, however she prefers to remain on the Dys 2/chopped diet at present. Should pt have a change of opinion, she could certainly upgrade. Also noted was multiple episodes of belching, however pt and her family agree this is baseline related to GERD. Pt is on Protonix here. Encouraged pt to communicate with physician any concerns for worsening GERD if present. Will plan to followup for cognitive-linguistic evaluation. Discussed POC with pt, family and RN; all parties verbalized understanding.   HPI HPI: Glenda [name] is a 82 y.o. female who has been complaining of vertigo and nausea/vomiting for the past couple of days. She stated that it started on Friday and has been persistent. Due to no improvement, she sought care in the emergency department where an MRI was performed which does show punctate cerebellar stroke.      SLP Plan  Continue with current plan of care     Recommendations  Diet recommendations: Dysphagia 2 (fine chop);Thin liquid (per pt preference) Liquids provided via: Cup;Straw Medication Administration: Whole meds with liquid Supervision: Patient able to self feed Compensations: Small sips/bites;Follow solids with liquid             Oral Care Recommendations: Oral care BID Follow up Recommendations:  (TBD) Plan: Continue with current plan of care     [place], [place] Pager [phone] 08/26/2015, 4:24 PM

## 2015-08-26 NOTE — Progress Notes (Signed)
PROGRESS NOTE  Glenda [name] [id number] DOB: [birth year] DOA: 08/24/2015 PCP: Donnie Coffin, MD  Brief Narrative: 82 year old woman presented with nausea and vomiting and dizziness. MRI of the brain revealed punctate acute infarcts right middle cerebral peduncle. Neurology evaluated and recommended stroke evaluation.  Assessment/Plan: 1. Acute cerebellar infarct with associated vomiting. LDL 70. 2. Vomiting secondary to acute infarct. Resolved significantly. 3. Hypertension, reports poor controls outpatient 4. Diabetes mellitus on insulin, stable 5. Breast cancer upper outer quadrant right, s/p right breast seed localized partial mastectomy with right axillary sentinel lymph node mapping 3/23. 6. Coronary artery disease, stable 7. Chronic kidney disease stage III versus acute kidney injury.  Continue CVA Work up and Mudlogger. Follow ECHO. For Rehab.  DVT prophylaxis: Lovenox Code Status: full code Family Communication: son Jenny Reichmann, daughter at bedside, additional daughter by telephone Disposition Plan: Pending therapy evaluations, likely next 1-2 days     08/26/2015, 5:35 PM  LOS: 2 days   Consultants:  Neurology  Procedures:    Antimicrobials:    Subjective: Seen alongside 2 daughters. Right sided weakness is improving. No fever or chills. Consultant's input is appreciated.  Objective: Filed Vitals:   08/26/15 0956 08/26/15 1400 08/26/15 1503 08/26/15 1504  BP: 196/66 191/59 209/66 204/78  Pulse: 81 72 103   Temp:  98.6 F (37 C)    TempSrc:  Oral    Resp: 18 18 18    Height:      Weight:      SpO2: 100% 99% 100%     Intake/Output Summary (Last 24 hours) at 08/26/15 1735 Last data filed at 08/26/15 0700  Gross per 24 hour  Intake    600 ml  Output      0 ml  Net    600 ml     Filed Weights   08/25/15 0500  Weight: 71.3 kg (157 lb 3 oz)    Exam:    Constitutional:  . Appears calm and comfortable Eyes:  . PERRL and irises appear normal ENMT:   . Mild hearing loss Respiratory:  . CTA bilaterally, no w/r/r.  . Respiratory effort normal. No retractions or accessory muscle use Cardiovascular:  . RRR, no m/r/g . No LE extremity edema   Musculoskeletal:  . LUE, LLE   o strength and tone normal, no atrophy, no abnormal movements o No tenderness, masses o RUE, RLE o Tone normal. Strength seems diminished 4+/5, no atrophy, no abnormal movements Neurologic:  . CN 2-12 intact . Right sided weakness. o    Scheduled Meds: .  stroke: mapping our early stages of recovery book   Does not apply Once  . clopidogrel  75 mg Oral Daily  . enoxaparin (LOVENOX) injection  40 mg Subcutaneous Q24H  . furosemide  80 mg Oral Daily  . hydrochlorothiazide  25 mg Oral Daily  . insulin aspart  0-15 Units Subcutaneous TID WC  . insulin aspart  0-5 Units Subcutaneous QHS  . insulin glargine  24 Units Subcutaneous q morning - 10a  . metoCLOPramide (REGLAN) injection  5 mg Intravenous Q6H  . pantoprazole  40 mg Oral Daily  . potassium chloride  10 mEq Oral Daily  . simvastatin  20 mg Oral q1800   Continuous Infusions: . sodium chloride 100 mL/hr at 08/24/15 2108    Principal Problem:   Acute ischemic stroke/right cerebellar Active Problems:   Diabetes mellitus type 1, uncontrolled, insulin dependent (HCC)   Uncontrolled hypertension   CAD (coronary artery disease)   Breast cancer of  upper-outer quadrant of right female breast [facility])   Essential hypertension   Diabetes mellitus type 2 in nonobese [facility])   History of breast cancer   AKI (acute kidney injury) ([place])   Acute blood loss anemia   Hemiparesis (HCC)   Gait disturbance, post-stroke   Dysphagia, post-stroke   LOS: 2 days   Time spent 20 minutes

## 2015-08-26 NOTE — Progress Notes (Signed)
  Echocardiogram 2D Echocardiogram has been performed.  Aggie Cosier 08/26/2015, 2:45 PM

## 2015-08-26 NOTE — Progress Notes (Signed)
STROKE TEAM PROGRESS NOTE   HISTORY OF PRESENT ILLNESS Glenda [name] is a 82 y.o. female who has been complaining of vertigo and nausea/vomiting for the past couple of days. She stated that it started on Friday 08/22/2015 and has been persistent. Due to no improvement, she sought care in the emergency department where an MRI was performed which does show punctate cerebellar stroke. Patient was not administered IV t-PA secondary to being outside of the window. She was admitted for further evaluation and treatment.   SUBJECTIVE (INTERVAL HISTORY) Her family is at the bedside.  Overall she feels her condition is stable. She still has mild right sided ataxia but gait is improving  OBJECTIVE Temp:  [97.9 F (36.6 C)-98.8 F (37.1 C)] 98.6 F (37 C) (05/02 1400) Pulse Rate:  [72-103] 103 (05/02 1503) Cardiac Rhythm:  [-] Normal sinus rhythm (05/02 0700) Resp:  [16-20] 18 (05/02 1503) BP: (182-211)/(46-80) 204/78 mmHg (05/02 1504) SpO2:  [96 %-100 %] 100 % (05/02 1503)  CBC:   Recent Labs Lab 08/24/15 0900  WBC 5.7  HGB 11.5*  HCT 35.6*  MCV 81.7  PLT [id number]   Basic Metabolic Panel:   Recent Labs Lab 08/24/15 0900 08/26/15 0554  NA 145 144  K 3.9 3.2*  CL 111 110  CO2 25 23  GLUCOSE 241* 194*  BUN 28* 23*  CREATININE 1.42* 1.38*  CALCIUM 9.3 9.3   Lipid Panel:     Component Value Date/Time   CHOL 151 08/25/2015 0353   TRIG 168* 08/25/2015 0353   HDL 47 08/25/2015 0353   CHOLHDL 3.2 08/25/2015 0353   VLDL 34 08/25/2015 0353   LDLCALC 70 08/25/2015 0353   HgbA1c:  Lab Results  Component Value Date   HGBA1C 10.2* 08/25/2015   Urine Drug Screen: No results found for: LABOPIA, COCAINSCRNUR, LABBENZ, AMPHETMU, THCU, LABBARB    IMAGING  Mr Jodene Nam Head Wo Contrast  08/25/2015  CLINICAL DATA:  Continued surveillance of acute posterior circulation infarct. EXAM: MRA NECK WITHOUT AND WITH CONTRAST MRA HEAD WITHOUT CONTRAST TECHNIQUE: Multiplanar and multiecho pulse  sequences of the neck were obtained without and with intravenous contrast. Angiographic images of the neck were obtained using MRA technique without and with intravenous contrast.; Angiographic images of the Circle of Willis were obtained using MRA technique without intravenous contrast. CONTRAST:  52mL MULTIHANCE GADOBENATE DIMEGLUMINE 529 MG/ML IV SOLN COMPARISON:  MR brain 08/24/2015. FINDINGS: MRA NECK FINDINGS Dolichoectasia proximal vasculature. Conventional branching of the great vessels from the arch. The proximal RIGHT common carotid artery demonstrates a estimated 75% stenosis. No flow-limiting disease at the bifurcation or throughout the course of the visualized RIGHT cervical ICA. Poorly visualized proximal LEFT common carotid artery without definite proximal narrowing. No significant atheromatous change at the bifurcation. Cervical ICA widely patent on the LEFT. Both vertebral arteries are tortuous. They are equal in size. No definite ostial lesion. There may be a 75% stenosis of the proximal RIGHT vertebral 2 cm above its origin. MRA HEAD FINDINGS Mild non stenotic irregularity of the cavernous and supraclinoid ICA segments on the RIGHT. 50% smooth stenosis of the cavernous and supraclinoid ICA segments on the LEFT. No proximal stenosis of the anterior or middle cerebral arteries. No MCA branch occlusion. No visible intracranial aneurysm. Basilar artery is widely patent. Both vertebrals contribute equally to its formation. There is no significant proximal PCA disease or cerebellar branch occlusion. IMPRESSION: 50% stenosis of the cavernous and supraclinoid ICA segments on the LEFT, non flow reducing. No significant  intracranial posterior circulation disease. No extracranial carotid bifurcation or cervical ICA lesion. Proximal RIGHT common carotid artery and proximal RIGHT vertebral artery as described above. Electronically Signed   By: Staci Righter M.D.   On: 08/25/2015 09:06   Mr Angiogram Neck W Wo  Contrast  08/25/2015  CLINICAL DATA:  Continued surveillance of acute posterior circulation infarct. EXAM: MRA NECK WITHOUT AND WITH CONTRAST MRA HEAD WITHOUT CONTRAST TECHNIQUE: Multiplanar and multiecho pulse sequences of the neck were obtained without and with intravenous contrast. Angiographic images of the neck were obtained using MRA technique without and with intravenous contrast.; Angiographic images of the Circle of Willis were obtained using MRA technique without intravenous contrast. CONTRAST:  75mL MULTIHANCE GADOBENATE DIMEGLUMINE 529 MG/ML IV SOLN COMPARISON:  MR brain 08/24/2015. FINDINGS: MRA NECK FINDINGS Dolichoectasia proximal vasculature. Conventional branching of the great vessels from the arch. The proximal RIGHT common carotid artery demonstrates a estimated 75% stenosis. No flow-limiting disease at the bifurcation or throughout the course of the visualized RIGHT cervical ICA. Poorly visualized proximal LEFT common carotid artery without definite proximal narrowing. No significant atheromatous change at the bifurcation. Cervical ICA widely patent on the LEFT. Both vertebral arteries are tortuous. They are equal in size. No definite ostial lesion. There may be a 75% stenosis of the proximal RIGHT vertebral 2 cm above its origin. MRA HEAD FINDINGS Mild non stenotic irregularity of the cavernous and supraclinoid ICA segments on the RIGHT. 50% smooth stenosis of the cavernous and supraclinoid ICA segments on the LEFT. No proximal stenosis of the anterior or middle cerebral arteries. No MCA branch occlusion. No visible intracranial aneurysm. Basilar artery is widely patent. Both vertebrals contribute equally to its formation. There is no significant proximal PCA disease or cerebellar branch occlusion. IMPRESSION: 50% stenosis of the cavernous and supraclinoid ICA segments on the LEFT, non flow reducing. No significant intracranial posterior circulation disease. No extracranial carotid bifurcation or  cervical ICA lesion. Proximal RIGHT common carotid artery and proximal RIGHT vertebral artery as described above. Electronically Signed   By: Staci Righter M.D.   On: 08/25/2015 09:06   Dg Abd Portable 1v  08/24/2015  CLINICAL DATA:  Nausea vomiting and vertigo, 2 days duration. EXAM: PORTABLE ABDOMEN - 1 VIEW COMPARISON:  None. FINDINGS: The bowel gas pattern is normal. No radio-opaque calculi or other significant radiographic abnormality are seen. IMPRESSION: Negative. Electronically Signed   By: Andreas Newport M.D.   On: 08/24/2015 20:11    PHYSICAL EXAM Pleasant elderly lady not in distress. . Afebrile. Head is nontraumatic. Neck is supple without bruit.    Cardiac exam no murmur or gallop. Lungs are clear to auscultation. Distal pulses are well felt. Neurological Exam ;  Awake  Alert oriented x 3. Normal speech and language.eye movements full without nystagmus.fundi were not visualized. Vision acuity and fields appear normal. Hearing is normal. Palatal movements are normal. Face symmetric. Tongue midline. Normal strength, tone, reflexes and coordination except mild right sided finger to nose and knee to heel dysmetria. Normal sensation. Gait deferred. ASSESSMENT/PLAN [name] is a 82 y.o. female with history of coronary artery disease, hypertension, diabetes, recent diagnosis of DCIS presenting with vertigo. She did not receive IV t-PA due to delay in arrival.   Stroke:  Punctate Right middle cerebellar peduncle infarct secondary to small vessel disease    Resultant  Nausea and vomiting, ataxia   MRI  Punctate Right middle cerebellar peduncle infarct  MRA head No significant intracranial posterior circulation disease. L  ICA 50% stenosis non flow reducing.   MRA  Neck No extracranial carotid bifurcation or cervical ICA lesion.   2D Echo  pending   LDL 70  HgbA1c 9.9  Lovenox 40 mg sq daily for VTE prophylaxis DIET DYS 2 Room service appropriate?: Yes; Fluid  consistency:: Thin  aspirin 81 mg daily and clopidogrel 75 mg daily prior to admission but had stopped plavix in January for a procedure, now on aspirin 325 mg daily and clopidogrel 75 mg daily (NO STENT). Continue plavix alone  Patient counseled to be compliant with her antithrombotic medications  Ongoing aggressive stroke risk factor management  Therapy recommendations:  CIR. Consult in place  Disposition:  pending   Hypertensive emergency  Blood pressure as high as 208/70, 202/100 on admission setting of neurologic symptoms  Remains elevated. Home medications resumed  Permissive hypertension (OK if < 220/120) but gradually normalize in 5-7 days  Hyperlipidemia  Home meds:  No statin  LDL 70, goal < 70  Added zocor  Continue statin at discharge  Diabetes type I, uncontrolled, insulin-dependent  HgbA1c pending, goal < 7.0  Other Stroke Risk Factors  Advanced age  Overweight, Body mass index is 25.38 kg/(m^2).   Hx stroke/TIA  06/2014 Left thalamic and posterior limb internal capsule lacunar infarct, History of peripheral vision loss ([place])  Coronary artery disease - medical management. No stent  Other Active Problems  Right breast cancer s/p right breast seed localized partial mastectomy with right axillary sentinel lymph node mapping 3/23.  Cr 1.42  Hospital day # 2  [facility] for Pager information 08/26/2015 3:33 PM  I have personally examined this patient, reviewed notes, independently viewed imaging studies, participated in medical decision making and plan of care. I have made any additions or clarifications directly to the above note. Agree with note above.  She presented with a punctate right cerebellar infarct likely due to small vessel disease.  Anticipate transfer to inpatient rehabilitation over the next few days. Follow-up as an outpatient in stroke clinic in 2 months. Stroke and sign off. Kindly call for  questions. Greater than 50% of time during this 15 minute visit was spent on counseling and coordination of care about her stroke risk. Antony Contras, MD Medical Director [place] Pager: [phone] 08/26/2015 3:33 PM    To contact Stroke Continuity provider, please refer to [email or web address] After hours, contact General Neurology

## 2015-08-26 NOTE — Progress Notes (Signed)
Physical Therapy Treatment Patient Details Name: Glenda [name] MRN: [id number] DOB: 1933/07/22 Today's Date: 08/26/2015    History of Present Illness 82 y.o. admitted for vertigo and nausea/vomiting. MRI revealed Punctate acute infarct in the right middle cerebellar peduncle. PMH includes GERD, obesity, DM, HTN, HLD, CAD, UTI, chest pain, breast cancer s/p surgery, arthritis, stroke, dizziness, SOB/dyspnea.    PT Comments    Patient continues to require mod/max +2 for gait training and reported dizziness during session but with no emesis. Pt reported double vision at times but especially when looking at the TV. Current plan remains appropriate.   Follow Up Recommendations  CIR     Equipment Recommendations  Other (comment)    Recommendations for Other Services Rehab consult     Precautions / Restrictions Precautions Precautions: Fall Precaution Comments: ataxia Restrictions Weight Bearing Restrictions: No    Mobility  Bed Mobility Overal bed mobility: Needs Assistance Bed Mobility: Supine to Sit     Supine to sit: Min guard;HOB elevated     General bed mobility comments: pt reported dizziness with movement from supine to sit but no dizziness in sitting position; increased time needed; min guard for safety  Transfers Overall transfer level: Needs assistance Equipment used: Rolling walker (2 wheeled);None Transfers: Sit to/from [facility] Sit to Stand: Mod assist;+2 safety/equipment;+2 physical assistance Stand pivot transfers: Min assist;+2 physical assistance;+2 safety/equipment       General transfer comment: mod A to power up into standing from EOB and gain balance upon stand with cues for hand placment and technique; assist for safe descent to EOB and recliner; min A +2 for stand pivot to chair   Ambulation/Gait Ambulation/Gait assistance: Mod assist;Max assist;+2 physical assistance;+2 safety/equipment Ambulation Distance (Feet): 12  Feet Assistive device: Rolling walker (2 wheeled) Gait Pattern/deviations: Ataxic Gait velocity: decreased   General Gait Details: pt ataxic with decreased coordination R > L but able to ambulate ~57ft with mod A +2; required max A +2 to retutn to EOB with pt unable to take more steps and requiring assist to move R foot; assist to manage RW needed throughout   Stairs            Wheelchair Mobility    Modified Rankin (Stroke Patients Only) Modified Rankin (Stroke Patients Only) Pre-Morbid Rankin Score: No symptoms Modified Rankin: Moderately severe disability     Balance Overall balance assessment: Needs assistance Sitting-balance support: No upper extremity supported;Feet supported Sitting balance-Leahy Scale: Fair     Standing balance support: Bilateral upper extremity supported Standing balance-Leahy Scale: Poor                      Cognition Arousal/Alertness: Awake/alert Behavior During Therapy: Flat affect Overall Cognitive Status: Within Functional Limits for tasks assessed                      Exercises      General Comments General comments (skin integrity, edema, etc.): pt reported double vision at times--especially when looking at the TV      Pertinent Vitals/Pain Pain Assessment: Faces Faces Pain Scale: No hurt    Home Living                      Prior Function            PT Goals (current goals can now be found in the care plan section) Acute Rehab PT Goals Patient Stated Goal: to return to independence Progress  towards PT goals: Progressing toward goals    Frequency  Min 4X/week    PT Plan Current plan remains appropriate    Co-evaluation             End of Session Equipment Utilized During Treatment: Gait belt Activity Tolerance: Patient limited by fatigue Patient left: in chair;with call bell/phone within reach;with chair alarm set;with family/visitor present     Time: [id number] PT Time  Calculation (min) (ACUTE ONLY): 24 min  Charges:  $Gait Training: 8-22 mins $Therapeutic Activity: 8-22 mins                    G Codes:      Salina April, PTA Pager: [phone]   08/26/2015, 2:35 PM

## 2015-08-27 ENCOUNTER — Telehealth: Payer: Self-pay | Admitting: Oncology

## 2015-08-27 LAB — GLUCOSE, CAPILLARY
GLUCOSE-CAPILLARY: 158 mg/dL — AB (ref 65–99)
GLUCOSE-CAPILLARY: 250 mg/dL — AB (ref 65–99)
GLUCOSE-CAPILLARY: 174 mg/dL — AB (ref 65–99)
Glucose-Capillary: 213 mg/dL — ABNORMAL HIGH (ref 65–99)

## 2015-08-27 NOTE — Progress Notes (Signed)
Rehab admissions - I am waiting for call from insurance case manager regarding possible acute inpatient rehab admission.  I updated two daughters at the bedside.  No beds open on rehab today, but we have beds after today.  I will update all once I hear back from insurance carrier.  Call me for questions.  [phone]

## 2015-08-27 NOTE — Progress Notes (Signed)
Rehab admissions - I have received a denial for acute inpatient rehab admission from insurance carrier.  If attending MD wishes to pursue a peer to peer review with Psychologist, occupational, it will need to be done before 3 pm tomorrow.  Call me for questions.  [phone]

## 2015-08-27 NOTE — Progress Notes (Signed)
Physical Therapy Treatment Patient Details Name: Glenda [name] MRN: [id number] DOB: 1933/06/29 Today's Date: 08/27/2015    History of Present Illness 82 y.o. admitted for vertigo and nausea/vomiting. MRI revealed Punctate acute infarct in the right middle cerebellar peduncle. PMH includes GERD, obesity, DM, HTN, HLD, CAD, UTI, chest pain, breast cancer s/p surgery, arthritis, stroke, dizziness, SOB/dyspnea.    PT Comments    Patient continues to demonstrate R sided weakness and with c/o dizziness throughout session. No c/o double vision or emesis. Worked on proprioception and gait this session. Pt motivated to participate. Continue to progress as tolerated.   Follow Up Recommendations  CIR     Equipment Recommendations  Other (comment)    Recommendations for Other Services Rehab consult     Precautions / Restrictions Precautions Precautions: Fall Precaution Comments: ataxia Restrictions Weight Bearing Restrictions: No    Mobility  Bed Mobility Overal bed mobility: Needs Assistance Bed Mobility: Supine to Sit     Supine to sit: Min guard;HOB elevated     General bed mobility comments: increased time needed   Transfers Overall transfer level: Needs assistance Equipment used: Rolling walker (2 wheeled);None Transfers: Sit to/from Stand (X2 from recliner X1 from EOB) Sit to Stand: Mod assist;+2 safety/equipment;+2 physical assistance         General transfer comment: assist to power up into standing and gain/maintain balance upon standing with max multimodal cues for hand placement and weight shifting to L LE  Ambulation/Gait Ambulation/Gait assistance: Mod assist;+2 physical assistance;+2 safety/equipment Ambulation Distance (Feet): 8 Feet Assistive device: Rolling walker (2 wheeled) Gait Pattern/deviations: Ataxic;Shuffle Gait velocity: decreased   General Gait Details: pt with heavy R lateral lean and inability to clear floor with R LE and with uncoordinated  movements; assist to maintain upright posture and return to midline and for management of RW   Stairs            Wheelchair Mobility    Modified Rankin (Stroke Patients Only) Modified Rankin (Stroke Patients Only) Pre-Morbid Rankin Score: No symptoms Modified Rankin: Moderately severe disability     Balance Overall balance assessment: Needs assistance Sitting-balance support: No upper extremity supported;Feet supported Sitting balance-Leahy Scale: Fair     Standing balance support: Bilateral upper extremity supported Standing balance-Leahy Scale: Poor Standing balance comment: worked on standing balance and righting reactions in front of mirror with pt performing pregait activities with cues for returning to midline when R lateral lean increased; X2 until pt fatigued                    Cognition Arousal/Alertness: Awake/alert Behavior During Therapy: Flat affect Overall Cognitive Status: Within Functional Limits for tasks assessed                      Exercises      General Comments General comments (skin integrity, edema, etc.): continues to c/o dizziness throughout session but no double vision or emesis       Pertinent Vitals/Pain Pain Assessment: No/denies pain    Home Living                      Prior Function            PT Goals (current goals can now be found in the care plan section) Acute Rehab PT Goals Patient Stated Goal: to return to independence Progress towards PT goals: Progressing toward goals    Frequency  Min 4X/week    PT Plan  Current plan remains appropriate    Co-evaluation             End of Session Equipment Utilized During Treatment: Gait belt Activity Tolerance: Patient tolerated treatment well Patient left: in chair;with call bell/phone within reach;with chair alarm set;with family/visitor present     Time: [id number] PT Time Calculation (min) (ACUTE ONLY): 19 min  Charges:  $Neuromuscular  Re-education: 8-22 mins                    G Codes:      Salina April, PTA Pager: [phone]   08/27/2015, 10:31 AM

## 2015-08-27 NOTE — Evaluation (Signed)
Speech Language Pathology Evaluation Patient Details Name: Glenda [name] MRN: [id number] DOB: 26-Nov-1933 Today's Date: 08/27/2015 Time: [id number] SLP Time Calculation (min) (ACUTE ONLY): 19 min  Problem List:  Patient Active Problem List   Diagnosis Date Noted  . Essential hypertension   . Diabetes mellitus type 2 in nonobese (HCC)   . History of breast cancer   . AKI (acute kidney injury) ([place])   . Acute blood loss anemia   . Hemiparesis ([place])   . Gait disturbance, post-stroke   . Dysphagia, post-stroke   . Acute ischemic stroke/right cerebellar 08/24/2015  . Breast cancer of upper-outer quadrant of right female breast ([place]) 04/30/2015  . CVA (cerebral infarction) 07/05/2014  . Hyperlipidemia 07/05/2014  . Syncope 07/04/2014  . Mild mitral regurgitation by prior echocardiogram 12/20/2011  . Diabetes mellitus type 1, uncontrolled, insulin dependent ([place]) 02/26/2009  . OBESITY 02/26/2009  . Uncontrolled hypertension 02/26/2009  . CAD (coronary artery disease) 02/26/2009  . GASTROESOPHAGEAL REFLUX DISEASE 02/26/2009   Past Medical History:  Past Medical History  Diagnosis Date  . GERD (gastroesophageal reflux disease)   . Obesity   . DM (diabetes mellitus) ([place])   . HTN (hypertension)   . HLD (hyperlipidemia)   . CAD (coronary artery disease)   . UTI (urinary tract infection)   . Chest pain   . Dizziness   . Breast cancer ([place])   . Arthritis   . Stroke ([place])   . Shortness of breath dyspnea    Past Surgical History:  Past Surgical History  Procedure Laterality Date  . Cholecystectomy    . Colon polyps; hx    . Coronary angioplasty  02/14/09  . Radioactive seed guided mastectomy with axillary sentinel lymph node biopsy Right 07/17/2015    Procedure: RADIOACTIVE SEED GUIDED PARTIAL MASTECTOMY WITH AXILLARY SENTINEL LYMPH NODE BIOPSY;  Surgeon: Erroll Luna, MD;  Location: [facility];  Service: General;  Laterality: Right;   HPI:  Deepti Handcock is a 82 y.o. female  who has been complaining of vertigo and nausea/vomiting for the past couple of days. She stated that it started on Friday and has been persistent. Due to no improvement, she sought care in the emergency department where an MRI was performed which does show punctate cerebellar stroke.   Assessment / Plan / Recommendation Clinical Impression  Pt givin MMSE at bedside. Pt scored a 27/30. Pt had delayed response time with questions requiring higher level functioning. Pt's daughter felt her speech and language was more delayed just after her stroke but has improved each day since. Pt'sanguage is felt to be [facility] in all other areas assessed. No follow up language/cognitive therapy is recommended at this time. Speech therapy to follow up with patient for diet tolerance and upgrades.     SLP Assessment  Patient needs continued Speech Lanaguage Pathology Services    Follow Up Recommendations       Frequency and Duration min 2x/week  2 weeks      SLP Evaluation Prior Functioning  Cognitive/Linguistic Baseline: Within functional limits Type of Home: House  Lives With: Daughter Available Help at Discharge: Family;Available PRN/intermittently   Cognition  Overall Cognitive Status: Within Functional Limits for tasks assessed Arousal/Alertness: Awake/alert Orientation Level: Oriented X4 Attention: Focused;Sustained Focused Attention: Appears intact Sustained Attention: Appears intact Memory: Appears intact Awareness: Appears intact Problem Solving: Appears intact Executive Function: Reasoning;Sequencing;Organizing;Decision Making Reasoning: Appears intact Sequencing: Appears intact Organizing: Appears intact Decision Making: Appears intact    Comprehension  Auditory Comprehension Overall Auditory Comprehension: Appears  within functional limits for tasks assessed Yes/No Questions: Within Functional Limits Commands: Within Functional Limits Conversation: Simple Visual  Recognition/Discrimination Discrimination: Within Function Limits    Expression Expression Primary Mode of Expression: Verbal Verbal Expression Overall Verbal Expression: Appears within functional limits for tasks assessed Initiation: No impairment Automatic Speech: Name;Social Response;Counting;Day of week;Month of year Level of Generative/Spontaneous Verbalization: Sentence Repetition: No impairment Naming: No impairment Pragmatics: No impairment Non-Verbal Means of Communication: Not applicable Written Expression Dominant Hand: Right Written Expression: Exceptions to [facility]   Oral / Motor  Oral Motor/Sensory Function Overall Oral Motor/Sensory Function: Within functional limits Motor Speech Overall Motor Speech: Appears within functional limits for tasks assessed   GO                   Charlynne Cousins Carver Murakami, MA, CCC-SLP 08/27/2015 2:07 PM

## 2015-08-27 NOTE — Progress Notes (Signed)
  PROGRESS NOTE  Namrata Seanez [id number] DOB: 1934/04/16 DOA: 08/24/2015 PCP: Donnie Coffin, MD  Brief Narrative: 82 year old woman presented with nausea and vomiting and dizziness. MRI of the brain revealed punctate acute infarcts right middle cerebral peduncle.    Assessment/Plan: 1. Acute right cerebellar infarct with associated vomiting. LDL 70. 2. Vomiting secondary to acute infarct. Resolved significantly. 3. Hypertension, reports poor controls outpatient 4. Diabetes mellitus on insulin, stable 5. Breast cancer upper outer quadrant right, s/p right breast seed localized partial mastectomy with right axillary sentinel lymph node mapping 3/23. 6. Coronary artery disease, stable 7. Chronic kidney disease stage III versus acute kidney injury.  Pursue rehab when bed is available.  DVT prophylaxis: Lovenox Code Status: full code Family Communication: son Jenny Reichmann, daughter at bedside, additional daughter by telephone Disposition Plan: Pending therapy evaluations, likely next 1-2 days     08/27/2015, 9:16 AM  LOS: 3 days   Consultants:  Neurology  Subjective: Seen alongside patient's daughter. Right sided weakness is improving. No fever or chills. Consultant's input is appreciated.  Objective: Filed Vitals:   08/26/15 1742 08/26/15 2217 08/27/15 0151 08/27/15 0646  BP: 180/58 210/81 215/79 209/79  Pulse: 85 78 71 73  Temp: 99.8 F (37.7 C) 98.6 F (37 C) 98.8 F (37.1 C) 98.7 F (37.1 C)  TempSrc: Oral Oral Oral Oral  Resp: 18 18 18 18   Height:      Weight:      SpO2: 98% 100% 95% 100%    Intake/Output Summary (Last 24 hours) at 08/27/15 0916 Last data filed at 08/27/15 0844  Gross per 24 hour  Intake    240 ml  Output      0 ml  Net    240 ml     Filed Weights   08/25/15 0500  Weight: 71.3 kg (157 lb 3 oz)    Exam:    Constitutional: NAD, calm, comfortable Neck: normal, supple, no masses, no thyromegaly Respiratory: clear to auscultation  bilaterally.  Cardiovascular: S1S2 Abdomen: no tenderness, no masses palpated.  Neurologic: AAO X 3. Mild right sided weakness.   Scheduled Meds: .  stroke: mapping our early stages of recovery book   Does not apply Once  . clopidogrel  75 mg Oral Daily  . enoxaparin (LOVENOX) injection  40 mg Subcutaneous Q24H  . furosemide  80 mg Oral Daily  . hydrochlorothiazide  25 mg Oral Daily  . insulin aspart  0-15 Units Subcutaneous TID WC  . insulin aspart  0-5 Units Subcutaneous QHS  . insulin glargine  24 Units Subcutaneous q morning - 10a  . metoCLOPramide (REGLAN) injection  5 mg Intravenous Q6H  . pantoprazole  40 mg Oral Daily  . potassium chloride  10 mEq Oral Daily  . simvastatin  20 mg Oral q1800   Continuous Infusions: . sodium chloride 100 mL/hr at 08/24/15 2108    Principal Problem:   Acute ischemic stroke/right cerebellar Active Problems:   Diabetes mellitus type 1, uncontrolled, insulin dependent ([place])   Uncontrolled hypertension   CAD (coronary artery disease)   Breast cancer of upper-outer quadrant of right female breast ([place])   Essential hypertension   Diabetes mellitus type 2 in nonobese ([place])   History of breast cancer   AKI (acute kidney injury) ([place])   Acute blood loss anemia   Hemiparesis (HCC)   Gait disturbance, post-stroke   Dysphagia, post-stroke   LOS: 3 days   Time spent 20 minutes

## 2015-08-27 NOTE — Telephone Encounter (Signed)
Called Glenda [name] back and advised her that radiation will be delayed 1-2 weeks so that Glenda [name] can complete inpatient rehab per Dr. Sondra Come.  Advised her that we will call to check on her next week.  Glenda [name] verbalized understanding and will relay message to Glenda [name].

## 2015-08-27 NOTE — Progress Notes (Signed)
Rehab admissions - I met with patient, daughter and her niece yesterday.  They were all interested in inpatient rehab admission.  I have called and opened the case with [facility] medicare.  Awaiting call back from insurance case manager.  Call me for questions.  #[phone]

## 2015-08-27 NOTE — Telephone Encounter (Signed)
Received a call from Bernie Covey, patient's daughter.  She said Glenda [name] will be going to inpatient rehab today or tomorrow.  She is wondering if radiation will be started tomorrow.

## 2015-08-28 ENCOUNTER — Ambulatory Visit: Payer: Medicare Other | Admitting: Radiation Oncology

## 2015-08-28 LAB — RENAL FUNCTION PANEL
Albumin: 3 g/dL — ABNORMAL LOW (ref 3.5–5.0)
Anion gap: 12 (ref 5–15)
BUN: 31 mg/dL — ABNORMAL HIGH (ref 6–20)
CO2: 25 mmol/L (ref 22–32)
Calcium: 9.1 mg/dL (ref 8.9–10.3)
Chloride: 105 mmol/L (ref 101–111)
Creatinine, Ser: 1.62 mg/dL — ABNORMAL HIGH (ref 0.44–1.00)
GFR calc Af Amer: 34 mL/min — ABNORMAL LOW (ref >60)
GFR calc non Af Amer: 29 mL/min — ABNORMAL LOW (ref >60)
Glucose, Bld: 144 mg/dL — ABNORMAL HIGH (ref 65–99)
Phosphorus: 4.5 mg/dL (ref 2.5–4.6)
Potassium: 3.4 mmol/L — ABNORMAL LOW (ref 3.5–5.1)
Sodium: 142 mmol/L (ref 135–145)

## 2015-08-28 LAB — GLUCOSE, CAPILLARY
GLUCOSE-CAPILLARY: 132 mg/dL — AB (ref 65–99)
GLUCOSE-CAPILLARY: 228 mg/dL — AB (ref 65–99)
Glucose-Capillary: 209 mg/dL — ABNORMAL HIGH (ref 65–99)

## 2015-08-28 MED ORDER — LISINOPRIL 20 MG PO TABS
20.0000 mg | ORAL_TABLET | Freq: Every day | ORAL | Status: DC
Start: 2015-08-28 — End: 2015-12-08

## 2015-08-28 MED ORDER — ENOXAPARIN SODIUM 30 MG/0.3ML ~~LOC~~ SOLN: 30.0000 mg | SUBCUTANEOUS | Status: DC

## 2015-08-28 MED ORDER — ROSUVASTATIN CALCIUM 5 MG PO TABS: 5.0000 mg | ORAL_TABLET | Freq: Every day | ORAL | Status: DC

## 2015-08-28 MED ORDER — LISINOPRIL 20 MG PO TABS
20.0000 mg | ORAL_TABLET | Freq: Every day | ORAL | Status: DC
  Administered 2015-08-28: 20 mg via ORAL
  Filled 2015-08-28 (×2): qty 2

## 2015-08-28 NOTE — Care Management Note (Signed)
Case Management Note  Patient Details  Name: Glenda [name] MRN: [id number] Date of Birth: [birth year]  Subjective/Objective:                    Action/Plan: Patient discharging to [facility] today. No further needs per CM.   Expected Discharge Date:                  Expected Discharge Plan:  Skilled Nursing Facility  In-House Referral:  Clinical Social Work  Discharge planning Services  CM Consult  Post Acute Care Choice:    Choice offered to:     DME Arranged:    DME Agency:     HH Arranged:    [place] Agency:     Status of Service:  Completed, signed off  Medicare Important Message Given:    Date Medicare IM Given:    Medicare IM give by:    Date Additional Medicare IM Given:    Additional Medicare Important Message give by:     If discussed at [place] of Stay Meetings, dates discussed:    Additional Comments:  Pollie Friar, RN 08/28/2015, 10:59 AM

## 2015-08-28 NOTE — Progress Notes (Signed)
Report called to [facility]

## 2015-08-28 NOTE — Discharge Summary (Signed)
Physician Discharge Summary  Patient ID: Glenda [name] MRN: [id number] DOB/AGE: 80-Mar-1935 80 y.o.  Admit date: 08/24/2015 Discharge date: 08/28/2015  Admission Diagnoses:  Discharge Diagnoses:  Principal Problem:   Acute ischemic stroke/right cerebellar Active Problems:   Diabetes mellitus type 1, uncontrolled, insulin dependent (HCC)   Uncontrolled hypertension   CAD (coronary artery disease)   Breast cancer of upper-outer quadrant of right female breast ([place])   Essential hypertension   Diabetes mellitus type 2 in nonobese ([place])   History of breast cancer   AKI (acute kidney injury) ([place])   Acute blood loss anemia   Hemiparesis (HCC)   Gait disturbance, post-stroke   Dysphagia, post-stroke   Discharged Condition: stable  Hospital Course: 82 y.o. female with medical history significant for CAD with moderate CAD on cath 2012, hypertension, dyslipidemia, diabetes on insulin, obesity, GERD and recent diagnosis of right-sided breast cancer status post partial mastectomy (lumpectomy) with pathology consistent with DCIS (Patient is scheduled to proceed with radiation oncology, but is now on hold due to current stroke). Patient presented with nausea and vomiting and dizziness. MRI of the brain revealed punctate acute infarcts right middle cerebral peduncle. Patient was admitted and managed for acute right cerebellar infarct. Nausea and vomiting was managed expectantly. Blood pressure remains elevated, and should be gradually controlled. The managing team should kindly monitor BP and manage accordingly. Neurology team assisted with patient's Management. Patient was also assessed by Physical Therapy and Occupational Therapy teams during the hospital stay. Patient is stable, and will be discharge to a [facility] for short term rehabilitation.      Consults: Neurology  Significant Diagnostic Studies: Imaging studies.  Discharge Medication - Please see Med. Rec. Treatments:     Discharge Exam: Blood pressure 180/59, pulse 67, temperature 98 F (36.7 C), temperature source Oral, resp. rate 16, height 5\' 6"  (1.676 m), weight 71.3 kg (157 lb 3 oz), SpO2 98 %.    Disposition: 01-Home or Self Care  Discharge Instructions    Call MD for:    Complete by:  As directed   Worsening symptoms     Diet - low sodium heart healthy    Complete by:  As directed      Discharge instructions    Complete by:  As directed   Monitor BP closely.     Increase activity slowly    Complete by:  As directed   As per PT            Medication List    STOP taking these medications        BYSTOLIC 20 MG Tabs  Generic drug:  Nebivolol HCl     cloNIDine 0.2 MG tablet  Commonly known as:  CATAPRES     ibuprofen 100 MG/5ML suspension  Commonly known as:  ADVIL,MOTRIN     valsartan-hydrochlorothiazide 320-25 MG tablet  Commonly known as:  DIOVAN-HCT      TAKE these medications        amLODipine 10 MG tablet  Commonly known as:  NORVASC  Take 10 mg by mouth daily.     aspirin 81 MG tablet  Take 81 mg by mouth daily.     clonazePAM 0.5 MG tablet  Commonly known as:  KLONOPIN  Take 0.25 mg by mouth 3 (three) times daily as needed for anxiety.     clopidogrel 75 MG tablet  Commonly known as:  PLAVIX  Take 75 mg by mouth daily.     furosemide 80 MG tablet  Commonly known as:  LASIX  Take 80 mg by mouth daily.     HUMALOG KWIKPEN 100 UNIT/ML KiwkPen  Generic drug:  insulin lispro  Inject 22-34 Units into the skin 2 (two) times daily. Sliding scale as follows: 80-199 = 22 units 200-299 = 26 units 300-399 = 30 units >400 = 34 units     HYDROcodone-acetaminophen 5-325 MG tablet  Commonly known as:  NORCO  Take 1 tablet by mouth every 6 (six) hours as needed for moderate pain.     lisinopril 20 MG tablet  Commonly known as:  PRINIVIL,ZESTRIL  Take 1 tablet (20 mg total) by mouth daily.     omeprazole 20 MG capsule  Commonly known as:  PRILOSEC  Take 20 mg by  mouth every morning.     potassium chloride 10 MEQ CR tablet  Commonly known as:  KLOR-CON  Take 10 mEq by mouth daily.     rosuvastatin 5 MG tablet  Commonly known as:  CRESTOR  Take 1 tablet (5 mg total) by mouth daily.     TOUJEO SOLOSTAR 300 UNIT/ML Sopn  Generic drug:  Insulin Glargine  Inject 24 Units into the skin every morning.           Follow-up Information    Follow up with Donnie Coffin, MD.   Specialty:  Family Medicine   Contact information:   [place] [employer] [place] [id number] [phone]       Signed: Bonnell Public 08/28/2015, 10:12 AM

## 2015-08-28 NOTE — Progress Notes (Signed)
Occupational Therapy Treatment Patient Details Name: Glenda [name] MRN: [id number] DOB: Apr 30, 1933 Today's Date: 08/28/2015    History of present illness 82 y.o. admitted for vertigo and nausea/vomiting. MRI revealed Punctate acute infarct in the right middle cerebellar peduncle. PMH includes GERD, obesity, DM, HTN, HLD, CAD, UTI, chest pain, breast cancer s/p surgery, arthritis, stroke, dizziness, SOB/dyspnea.   OT comments  Pt progressing towards acute OT goals. Focus of session was sitting balance, functional transfers, and grooming. Pt very motivated during session. Impulsivity noted during transfer to recliner with uncontrolled descent into recliner. Pt reporting dizziness throughout session, worsened coming to EOB.  Session details below. Noted insurance denial of CIR. Continue to feel pt would be a great candidate. Daughter present for session.    Follow Up Recommendations  CIR    Equipment Recommendations  Other (comment) (defer)    Recommendations for Other Services      Precautions / Restrictions Precautions Precautions: Fall Precaution Comments: ataxia Restrictions Weight Bearing Restrictions: No       Mobility Bed Mobility Overal bed mobility: Needs Assistance Bed Mobility: Supine to Sit     Supine to sit: Min guard;HOB elevated     General bed mobility comments: increased time and effort. used bed rails.   Transfers Overall transfer level: Needs assistance Equipment used: Rolling walker (2 wheeled);None Transfers: Sit to/from [facility] Sit to Stand: Mod assist Stand pivot transfers: +2 physical assistance;Mod assist       General transfer comment: Performed with +1 max A with pt initiating SPT prematurely and uncontrolled descent into recliner. Ataxia noted.     Balance Overall balance assessment: Needs assistance Sitting-balance support: Feet supported;No upper extremity supported Sitting balance-Leahy Scale: Fair Sitting balance  - Comments: Pt with difficulty maintaining midline posture, leaning to right while sitting EOB. Able to correct with cues provided. Dynamic tasks completed sitting EOB and in recliner with pt able to maintain overall upright position at min guard level.  Postural control: Right lateral lean Standing balance support: Bilateral upper extremity supported Standing balance-Leahy Scale: Poor Standing balance comment: Pt impulsive in standing initiating SPT prematurely. Ataxia noted. Mod A and rw for static standing posture.                    ADL Overall ADL's : Needs assistance/impaired     Grooming: Oral care;Wash/dry face;Set up;Sitting;Supervision/safety Grooming Details (indicate cue type and reason): completed sitting up in recliner                               General ADL Comments: Pt completed bed mobility, SPT from EOB to recliner, and grooming tasks in seated position. Some impulsivity noted with transfer, uncontrolled descent into recliner with max A provided. Pt very motivited. daughter present.        Vision                 Additional Comments: Pt reports blurriness/double vision is improving.    Perception     Praxis      Cognition   Behavior During Therapy: WFL for tasks assessed/performed;Flat affect Overall Cognitive Status: Within Functional Limits for tasks assessed                       Extremity/Trunk Assessment               Exercises     Shoulder Instructions  General Comments      Pertinent Vitals/ Pain       Pain Assessment: No/denies pain  Home Living                                          Prior Functioning/Environment              Frequency Min 2X/week     Progress Toward Goals  OT Goals(current goals can now be found in the care plan section)  Progress towards OT goals: Progressing toward goals  Acute Rehab OT Goals Patient Stated Goal: to return to independence OT  Goal Formulation: With patient Time For Goal Achievement: 09/01/15 Potential to Achieve Goals: Good ADL Goals Pt Will Perform Grooming: with min guard assist;standing Pt Will Perform Lower Body Bathing: with min guard assist;sit to/from stand Pt Will Perform Lower Body Dressing: with min guard assist;sit to/from stand Pt Will Transfer to Toilet: ambulating;bedside commode;with min assist Pt Will Perform Toileting - Clothing Manipulation and hygiene: with min guard assist;sit to/from stand  Plan Discharge plan remains appropriate    Co-evaluation                 End of Session Equipment Utilized During Treatment: Gait belt;Rolling walker   Activity Tolerance Other (comment) (dizzy)   Patient Left with call bell/phone within reach;with chair alarm set;in chair   Nurse Communication          Time: [phone] OT Time Calculation (min): 22 min  Charges: OT General Charges $OT Visit: 1 Procedure OT Treatments $Self Care/Home Management : 8-22 mins  Hortencia Pilar 08/28/2015, 10:17 AM

## 2015-08-28 NOTE — NC FL2 (Signed)
MEDICAID FL2 LEVEL OF CARE SCREENING TOOL     IDENTIFICATION  Patient Name: Glenda [name] Birthdate: Aug 16, 1933 Sex: female Admission Date (Current Location): 08/24/2015  [facility] and [place] Number:  Herbalist and Address:  [facility]. [facility], [place] [address] [place], [place] [address]      Provider Number: [id number]  Attending Physician Name and Address:  Bonnell Public, MD  Relative Name and Phone Number:       Current Level of Care: Hospital Recommended Level of Care: [place] Prior Approval Number:    Date Approved/Denied:   PASRR Number: [id number]  Discharge Plan: SNF    Current Diagnoses: Patient Active Problem List   Diagnosis Date Noted  . Essential hypertension   . Diabetes mellitus type 2 in nonobese (HCC)   . History of breast cancer   . AKI (acute kidney injury) ([place])   . Acute blood loss anemia   . Hemiparesis ([place])   . Gait disturbance, post-stroke   . Dysphagia, post-stroke   . Acute ischemic stroke/right cerebellar 08/24/2015  . Breast cancer of upper-outer quadrant of right female breast ([place]) 04/30/2015  . CVA (cerebral infarction) 07/05/2014  . Hyperlipidemia 07/05/2014  . Syncope 07/04/2014  . Mild mitral regurgitation by prior echocardiogram 12/20/2011  . Diabetes mellitus type 1, uncontrolled, insulin dependent ([place]) 02/26/2009  . OBESITY 02/26/2009  . Uncontrolled hypertension 02/26/2009  . CAD (coronary artery disease) 02/26/2009  . GASTROESOPHAGEAL REFLUX DISEASE 02/26/2009    Orientation RESPIRATION BLADDER Height & Weight     Self, Time, Situation, Place  Normal Continent Weight: 157 lb 3 oz (71.3 kg) Height:  5\' 6"  (167.6 cm)  BEHAVIORAL SYMPTOMS/MOOD NEUROLOGICAL BOWEL NUTRITION STATUS   (NONE )  (NONE ) Continent Diet (DYS 2)  AMBULATORY STATUS COMMUNICATION OF NEEDS Skin   Extensive Assist Verbally Normal                       Personal Care Assistance  Level of Assistance  Bathing, Feeding, Dressing Bathing Assistance: Limited assistance Feeding assistance: Independent Dressing Assistance: Limited assistance     Functional Limitations Info  Sight, Hearing, Speech Sight Info: Adequate Hearing Info: Adequate Speech Info: Adequate    SPECIAL CARE FACTORS FREQUENCY  PT (By licensed PT), OT (By licensed OT)     PT Frequency: 3 OT Frequency: 2            Contractures      Additional Factors Info  Code Status, Allergies Code Status Info: FULL CODE  Allergies Info: N/A            Current Medications (08/28/2015):  This is the current hospital active medication list Current Facility-Administered Medications  Medication Dose Route Frequency Provider Last Rate Last Dose  .  stroke: mapping our early stages of recovery book   Does not apply Once Samella Parr, NP      . 0.9 %  sodium chloride infusion   Intravenous Continuous Hewitt Shorts [place], PA-C 100 mL/hr at 08/24/15 2108    . clonazePAM (KLONOPIN) tablet 0.25 mg  0.25 mg Oral TID PRN Samella Parr, NP      . clopidogrel (PLAVIX) tablet 75 mg  75 mg Oral Daily Samella Parr, NP   75 mg at 08/27/15 1014  . enoxaparin (LOVENOX) injection 40 mg  40 mg Subcutaneous Q24H Samella Parr, NP   40 mg at 08/27/15 1719  . furosemide (LASIX) tablet  80 mg  80 mg Oral Daily Samella Parr, NP   80 mg at 08/27/15 1014  . hydrochlorothiazide (HYDRODIURIL) tablet 25 mg  25 mg Oral Daily Costin Karlyne Greenspan, MD   25 mg at 08/27/15 1014  . HYDROcodone-acetaminophen (NORCO/VICODIN) 5-325 MG per tablet 1 tablet  1 tablet Oral Q6H PRN Samella Parr, NP      . insulin aspart (novoLOG) injection 0-15 Units  0-15 Units Subcutaneous TID WC Samella Parr, NP   2 Units at 08/28/15 [phone]  . insulin aspart (novoLOG) injection 0-5 Units  0-5 Units Subcutaneous QHS Samella Parr, NP   2 Units at 08/24/15 2307  . insulin glargine (LANTUS) injection 24 Units  24 Units Subcutaneous q morning - 10a  Samella Parr, NP   24 Units at 08/27/15 1015  . labetalol (NORMODYNE,TRANDATE) injection 10 mg  10 mg Intravenous Q2H PRN Wallie Char   10 mg at 08/27/15 [id number]  . lisinopril (PRINIVIL,ZESTRIL) tablet 20 mg  20 mg Oral Daily Bonnell Public, MD      . metoCLOPramide (REGLAN) injection 5 mg  5 mg Intravenous Q6H Laura A [place], PA-C   5 mg at 08/28/15 0653  . ondansetron (ZOFRAN) injection 4 mg  4 mg Intravenous Q6H PRN Samella Parr, NP   4 mg at 08/25/15 0714  . pantoprazole (PROTONIX) EC tablet 40 mg  40 mg Oral Daily Samella Parr, NP   40 mg at 08/27/15 1014  . potassium chloride (K-DUR,KLOR-CON) CR tablet 10 mEq  10 mEq Oral Daily Samella Parr, NP   10 mEq at 08/27/15 1014  . simvastatin (ZOCOR) tablet 20 mg  20 mg Oral q1800 Donzetta Starch, NP   20 mg at 08/27/15 1719     Discharge Medications: Please see discharge summary for a list of discharge medications.  Relevant Imaging Results:  Relevant Lab Results:   Additional Information SSN [id number]  Rozell Searing, LCSW

## 2015-08-28 NOTE — Progress Notes (Signed)
Patient changed into home clothes and IV removed from left forearm. Waiting for [employer]. Thayer Ohm D

## 2015-08-28 NOTE — Clinical Social Work Placement (Signed)
   CLINICAL SOCIAL WORK PLACEMENT  NOTE  Date:  08/28/2015  Patient Details  Name: Aletta Bixler MRN: [id number] Date of Birth: 1933/04/30  Clinical Social Work is seeking post-discharge placement for this patient at the [facility] level of care (*CSW will initial, date and re-position this form in  chart as items are completed):  Yes   Patient/family provided with [facility] Work Department's list of facilities offering this level of care within the geographic area requested by the patient (or if unable, by the patient's family).  Yes   Patient/family informed of their freedom to choose among providers that offer the needed level of care, that participate in Medicare, Medicaid or managed care program needed by the patient, have an available bed and are willing to accept the patient.  Yes   Patient/family informed of [facility]'s ownership interest in [facility] and [facility], as well as of the fact that they are under no obligation to receive care at these facilities.  PASRR submitted to EDS on 08/28/15     PASRR number received on 08/28/15     Existing PASRR number confirmed on       FL2 transmitted to all facilities in geographic area requested by pt/family on 08/28/15     FL2 transmitted to all facilities within larger geographic area on 08/28/15     Patient informed that his/her managed care company has contracts with or will negotiate with certain facilities, including the following:        Yes   Patient/family informed of bed offers received.  Patient chooses bed at  [facility] )     Physician recommends and patient chooses bed at      Patient to be transferred to  [facility] ) on 08/28/15.  Patient to be transferred to facility by  [facility] )     Patient family notified on 08/28/15 of transfer.  Name of family member notified:   (Pt's dtr, Joann)     PHYSICIAN Please sign FL2      Additional Comment:    _______________________________________________ Rozell Searing, LCSW 08/28/2015, 11:01 AM

## 2015-08-28 NOTE — Clinical Social Work Note (Signed)
Patient's dtr, Arville Go [phone].  Glendon Axe, MSW, LCSWA [phone] 08/28/2015 10:18 AM

## 2015-08-28 NOTE — Care Management Important Message (Signed)
Important Message  Patient Details  Name: Glenda [name] MRN: [id number] Date of Birth: Feb 25, 1934   Medicare Important Message Given:  Yes    Soraiya Ahner P Darnell Jeschke 08/28/2015, 12:41 PM

## 2015-08-28 NOTE — Progress Notes (Signed)
Physical Therapy Treatment Patient Details Name: Glenda [name] MRN: [id number] DOB: [birth year] Today's Date: 08/28/2015    History of Present Illness 82 y.o. admitted for vertigo and nausea/vomiting. MRI revealed Punctate acute infarct in the right middle cerebellar peduncle. PMH includes GERD, obesity, DM, HTN, HLD, CAD, UTI, chest pain, breast cancer s/p surgery, arthritis, stroke, dizziness, SOB/dyspnea.    PT Comments    Patient is making progress toward mobility goals and demonstrated improved ability to self correct with cueing this session. Continues to need mod/max +2 for safety with ambulation. Current plan remains appropriate.   Follow Up Recommendations  CIR     Equipment Recommendations  Other (comment)    Recommendations for Other Services Rehab consult     Precautions / Restrictions Precautions Precautions: Fall Precaution Comments: ataxia Restrictions Weight Bearing Restrictions: No    Mobility  Bed Mobility Overal bed mobility: Needs Assistance Bed Mobility: Supine to Sit     Supine to sit: Min guard;HOB elevated     General bed mobility comments: increased time needed   Transfers Overall transfer level: Needs assistance Equipment used: Rolling walker (2 wheeled);None Transfers: Sit to/from Stand Sit to Stand: Mod assist;+2 safety/equipment;+2 physical assistance         General transfer comment: assist for anterior translation into standing and to gain balance upon standing; pt with uncontrolled descent to chair X2 with need for assistance and cueing for hand placment; pt reached back with one UE second trial  Ambulation/Gait Ambulation/Gait assistance: Mod assist;Max assist;+2 safety/equipment Ambulation Distance (Feet): 24 Feet (8X3) Assistive device: Rolling walker (2 wheeled) Gait Pattern/deviations: Step-to pattern;Ataxic;Decreased weight shift to left;Drifts right/left;Narrow base of support Gait velocity: decreased   General Gait  Details: pt fatigued quickly; mod/max A to maintain balance; 3 bouts of 79ft with pt leaning heavily to the R and R knee buckling; pt able to correct WS to L side and correct narrow BOS with tactile cues to R LE when progressing forward   Stairs            Wheelchair Mobility    Modified Rankin (Stroke Patients Only) Modified Rankin (Stroke Patients Only) Pre-Morbid Rankin Score: No symptoms Modified Rankin: Moderately severe disability     Balance Overall balance assessment: Needs assistance Sitting-balance support: No upper extremity supported;Feet supported Sitting balance-Leahy Scale: Fair   Postural control: Right lateral lean Standing balance support: Bilateral upper extremity supported Standing balance-Leahy Scale: Poor                      Cognition Arousal/Alertness: Awake/alert Behavior During Therapy: Flat affect Overall Cognitive Status: Within Functional Limits for tasks assessed                      Exercises      General Comments        Pertinent Vitals/Pain Pain Assessment: No/denies pain    Home Living                      Prior Function            PT Goals (current goals can now be found in the care plan section) Acute Rehab PT Goals Patient Stated Goal: go to rehab Progress towards PT goals: Progressing toward goals    Frequency  Min 4X/week    PT Plan Current plan remains appropriate    Co-evaluation             End of Session Equipment  Utilized During Treatment: Gait belt Activity Tolerance: Patient tolerated treatment well Patient left: in chair;with call bell/phone within reach;with chair alarm set;with family/visitor present     Time: [id number] PT Time Calculation (min) (ACUTE ONLY): 17 min  Charges:  $Gait Training: 8-22 mins                    G Codes:      Salina April, PTA Pager: [phone]   08/28/2015, 2:17 PM

## 2015-08-29 NOTE — Clinical Social Work Note (Signed)
Clinical Social Work Assessment  Patient Details  Name: Glenda [name] MRN: [id number] Date of Birth: April 18, 1934  Date of referral:  08/28/15               Reason for consult:  Facility Placement                Permission sought to share information with:  Family Supports, Customer service manager, Case Optician, dispensing granted to share information::  Yes, Verbal Permission Granted  Name::      (Virgie and Astronomer)  Agency::   (SNF's )  Relationship::   (Daughters )  Sport and exercise psychologist Information:     Housing/Transportation Living arrangements for the past 2 months:  Single Family Home Source of Information:  Adult Children Patient Interpreter Needed:  None Criminal Activity/Legal Involvement Pertinent to Current Situation/Hospitalization:  No - Comment as needed Significant Relationships:  Adult Children Lives with:  Adult Children Do you feel safe going back to the place where you live?  No Need for family participation in patient care:  No (Coment)  Care giving concerns:  Insurance denied CIR. Patient now requiring SNF placement.    Social Worker assessment / plan:  Holiday representative met with patient and daughter at bedside in reference to post-acute placement. CSW  introduced CSW role and SNF process. Patient and family agreeable to SNF placement and prefers [facility]. Bed confirmed for [facility]. Patient to transition today, 5/4. No further concerns reported at this time. CSW remains available as needed.   Clinical Social Worker facilitated patient discharge including contacting patient family and facility to confirm patient discharge plans.  Clinical information faxed to facility and family agreeable with plan.  CSW arranged ambulance transport via [facility] to [facility]  RN to call report prior to discharge.  Clinical Social Worker will sign off for now as social work intervention is no longer needed. Please consult [place] again if new need arises.    Employment status:  Retired Nurse, adult PT Recommendations:  [place], [place] / Referral to community resources:  [place]  Patient/Family's Response to care:  Family supportive and agreeable.   Patient/Family's Understanding of and Emotional Response to Diagnosis, Current Treatment, and Prognosis:  Family aware.   Emotional Assessment Appearance:  Appears stated age Attitude/Demeanor/Rapport:   (Pleasant ) Affect (typically observed):  Accepting, Appropriate, Pleasant Orientation:  Oriented to Situation, Oriented to  Time, Oriented to Place, Oriented to Self Alcohol / Substance use:  Not Applicable Psych involvement (Current and /or in the community):  No (Comment)  Discharge Needs  Concerns to be addressed:  No discharge needs identified Readmission within the last 30 days:  No Current discharge risk:  None Barriers to Discharge:  No Barriers Identified   Glendon Axe, MSW, LCSWA [phone] 08/29/2015 8:55 AM

## 2015-09-01 ENCOUNTER — Ambulatory Visit: Payer: Medicare Other

## 2015-09-02 ENCOUNTER — Ambulatory Visit: Payer: Medicare Other

## 2015-09-03 ENCOUNTER — Encounter: Payer: Self-pay | Admitting: Adult Health

## 2015-09-03 ENCOUNTER — Non-Acute Institutional Stay (SKILLED_NURSING_FACILITY): Payer: Medicare Other | Admitting: Adult Health

## 2015-09-03 ENCOUNTER — Ambulatory Visit: Payer: Medicare Other

## 2015-09-03 DIAGNOSIS — E785 Hyperlipidemia, unspecified: Secondary | ICD-10-CM | POA: Diagnosis not present

## 2015-09-03 DIAGNOSIS — I639 Cerebral infarction, unspecified: Secondary | ICD-10-CM | POA: Diagnosis not present

## 2015-09-03 DIAGNOSIS — E1165 Type 2 diabetes mellitus with hyperglycemia: Secondary | ICD-10-CM

## 2015-09-03 DIAGNOSIS — C50411 Malignant neoplasm of upper-outer quadrant of right female breast: Secondary | ICD-10-CM | POA: Diagnosis not present

## 2015-09-03 DIAGNOSIS — I10 Essential (primary) hypertension: Secondary | ICD-10-CM

## 2015-09-03 DIAGNOSIS — R600 Localized edema: Secondary | ICD-10-CM | POA: Diagnosis not present

## 2015-09-03 DIAGNOSIS — K2100 Gastro-esophageal reflux disease with esophagitis, without bleeding: Secondary | ICD-10-CM

## 2015-09-03 DIAGNOSIS — Z794 Long term (current) use of insulin: Secondary | ICD-10-CM | POA: Diagnosis not present

## 2015-09-03 DIAGNOSIS — IMO0002 Reserved for concepts with insufficient information to code with codable children: Secondary | ICD-10-CM

## 2015-09-03 DIAGNOSIS — E1149 Type 2 diabetes mellitus with other diabetic neurological complication: Secondary | ICD-10-CM | POA: Diagnosis not present

## 2015-09-03 NOTE — Progress Notes (Addendum)
Patient ID: Glenda [name], female   DOB: [birth year], 82 y.o.   MRN: [id number]    Facility: [facility]     CODE STATUS: Full Code  No Known Allergies  Chief Complaint  Patient presents with  . Hospitalization Follow-up    Hospital Follow up    HPI:  She has had a recent hospitalization for a right cva. She has recently been diagnosed with right breast cancer is status post lumpectomy and is due for radiation therapy. She is here for short term rehab with her goal to return home. She is having vomiting. She feels as though solid food is getting stuck in her throat and won't go down. This has been happening for the 3 days. Per her family her cbg's have been variable.    Past Medical History  Diagnosis Date  . GERD (gastroesophageal reflux disease)   . Obesity   . DM (diabetes mellitus) ([place])   . HTN (hypertension)   . HLD (hyperlipidemia)   . CAD (coronary artery disease)   . UTI (urinary tract infection)   . Chest pain   . Dizziness   . Breast cancer ([place])   . Arthritis   . Stroke ([place])   . Shortness of breath dyspnea     Past Surgical History  Procedure Laterality Date  . Cholecystectomy    . Colon polyps; hx    . Coronary angioplasty  02/14/09  . Radioactive seed guided mastectomy with axillary sentinel lymph node biopsy Right 07/17/2015    Procedure: RADIOACTIVE SEED GUIDED PARTIAL MASTECTOMY WITH AXILLARY SENTINEL LYMPH NODE BIOPSY;  Surgeon: Erroll Luna, MD;  Location: [facility];  Service: General;  Laterality: Right;    Social History   Social History  . Marital Status: Widowed    Spouse Name: N/A  . Number of Children: 5  . Years of Education: N/A   Occupational History  . Not on file.   Social History Main Topics  . Smoking status: Never Smoker   . Smokeless tobacco: Never Used  . Alcohol Use: No  . Drug Use: No  . Sexual Activity: Not on file   Other Topics Concern  . Not on file   Social History Narrative   Retired.    Family History    Problem Relation Age of Onset  . Diabetes Mother   . Colon cancer Father     also had lung  . Diabetes    . Heart attack    . Diabetes Sister     [name]  . Breast cancer Sister       VITAL SIGNS BP 176/81 mmHg  Pulse 77  Temp(Src) 97.1 F (36.2 C) (Oral)  Resp 18  SpO2 99%  Patient's Medications  New Prescriptions   No medications on file  Previous Medications   AMLODIPINE (NORVASC) 10 MG TABLET    Take 10 mg by mouth daily.   ASPIRIN 81 MG TABLET    Take 81 mg by mouth daily.   CLONAZEPAM (KLONOPIN) 0.5 MG TABLET    Take 0.25 mg by mouth 3 (three) times daily as needed for anxiety.   CLOPIDOGREL (PLAVIX) 75 MG TABLET    Take 75 mg by mouth daily.   FUROSEMIDE (LASIX) 80 MG TABLET    Take 80 mg by mouth daily.   HUMALOG KWIKPEN 100 UNIT/ML KIWKPEN    Inject 22-34 Units into the skin 2 (two) times daily. Sliding scale as follows: 80-199 = 22 units 200-299 = 26 units 300-399 = 30  units >400 = 34 units   HYDROCODONE-ACETAMINOPHEN (NORCO) 5-325 MG TABLET    Take 1 tablet by mouth every 6 (six) hours as needed for moderate pain.   LISINOPRIL (PRINIVIL,ZESTRIL) 20 MG TABLET    Take 1 tablet (20 mg total) by mouth daily.   OMEPRAZOLE (PRILOSEC) 20 MG CAPSULE    Take 20 mg by mouth every morning.    ONDANSETRON (ZOFRAN) 4 MG TABLET    Take 4 mg by mouth every 6 (six) hours as needed for nausea or vomiting.   POTASSIUM CHLORIDE (KLOR-CON) 10 MEQ CR TABLET    Take 10 mEq by mouth daily.     ROSUVASTATIN (CRESTOR) 5 MG TABLET    Take 1 tablet (5 mg total) by mouth daily.   TOUJEO SOLOSTAR 300 UNIT/ML SOPN    Inject 24 Units into the skin every morning.   Modified Medications   No medications on file  Discontinued Medications   No medications on file     SIGNIFICANT DIAGNOSTIC EXAMS  08-24-15: mri of brain: 1. Punctate acute infarct in the right middle cerebellar peduncle. 2. Moderate chronic small vessel ischemic changes as above.   08-24-15: kub: The bowel gas pattern is  normal. No radio-opaque calculi or other significant radiographic abnormality are seen.  08-25-15: mra of head and neck: 50% stenosis of the cavernous and supraclinoid ICA segments on the LEFT, non flow reducing. No significant intracranial posterior circulation disease. No extracranial carotid bifurcation or cervical ICA lesion. Proximal RIGHT common carotid artery and proximal RIGHT vertebral artery as described above.  08-26-15: TEE: Left ventricle: The cavity size was normal. There was mild concentric hypertrophy. Systolic function was normal. The estimated ejection fraction was in the range of 60% to 65%. There was dynamic obstruction at restin the mid cavity. Wall motion was normal; there were no regional wall motion abnormalities. Doppler parameters are consistent with abnormal left ventricular relaxation (grade 1 diastolic dysfunction). Doppler parameters are consistent with high ventricular filling pressure. - Aortic valve: Transvalvular velocity was within the normal range. There was no stenosis. There was mild regurgitation. - Mitral valve: There was trivial regurgitation. - Right ventricle: The cavity size was normal. Wall thickness was normal. Systolic function was normal. - Tricuspid valve: There was mild regurgitation. - Pulmonary arteries: Systolic pressure was mildly increased. PA peak pressure: 39 mm Hg (S).    LABS REVIEWED;   08-24-15: wbc 5.6; hgb 11.5 hct 35.6; mcv 81.7; plt 241; glucose 241; bun 28; creat 1.42; k+ 3.9; na++ 145; liver normal albumin 3.2  hgb a1c 9.9  08-25-15: chol 154; ldl 70; trig 168; hdl 47; hgb a1c 10.2 08-28-15: glucose 144; bun 31; creat 1.62; k+ 3.4; na++142; phos 4.5; albumin 3.0    Review of Systems  Constitutional: Negative for malaise/fatigue.  Respiratory: Negative for cough and shortness of breath.   Cardiovascular: Negative for chest pain, palpitations and leg swelling.  Gastrointestinal: Positive for heartburn and vomiting. Negative for  abdominal pain, diarrhea and constipation.  Musculoskeletal: Negative for myalgias, back pain and joint pain.  Skin: Negative.   Neurological: Negative for dizziness.  Psychiatric/Behavioral: The patient is not nervous/anxious.    Physical Exam  Constitutional: She is oriented to person, place, and time. No distress.  Eyes: Conjunctivae are normal.  Neck: Neck supple. No JVD present. No thyromegaly present.  Cardiovascular: Normal rate, regular rhythm and intact distal pulses.   Respiratory: Effort normal and breath sounds normal. No respiratory distress. She has no wheezes.  GI: Soft.  Bowel sounds are normal. She exhibits no distension. There is no tenderness.  Musculoskeletal: She exhibits no edema.  Able to move all extremities  Has right side weakness   Lymphadenopathy:    She has no cervical adenopathy.  Neurological: She is alert and oriented to person, place, and time.  Skin: Skin is warm and dry. She is not diaphoretic.  Psychiatric: She has a normal mood and affect.     ASSESSMENT/ PLAN:  1. Diabetes:   hgb a1c 10.2 will increase tuojeo to 30 units daily; will keep humalog SSI and will begin 5 units with all meals (hold for cbg <80); will monitor   2. Hypertension; will continue norvasc 10 mg daily lisinopril 10 mg daily; asa 81 mg daily will monitor   3. Dyslipidemia: will continue crestor 5 mg daily ldl is 70   4. Hypokalemia: will continue k+ 10 meq daily   5. Gerd: food is getting stuck in her distal esophagus: will stop prilosec will begin protonix 40 mg twice daily and will monitor her status. I have spoken with speech therapy. If she has not improved by 09-08-15; will have her referred to GI for further evaluation.   6. Lower extremity edema: will continue lasix 80 mg daily with k+ 10 meq daily   7. CVA: is neurologically stable; has mild right side weakness; will continue therapy as directed and will continue asa 81 mg daily and plavix 75 mg daily; has vicodin  5/325 mg every 6 hours as needed for pain.   8. Anxiety: will continue klonopin 0.25 mg every 8 hours as needed  9. Right breast cancer: is status post lumpectomy; will monitor will follow up with oncology     Ok Edwards NP [facility]  Contact [phone] Monday through Friday 8am- 5pm  After hours call [phone]

## 2015-09-04 ENCOUNTER — Encounter: Payer: Self-pay | Admitting: Adult Health

## 2015-09-04 ENCOUNTER — Ambulatory Visit: Payer: Medicare Other

## 2015-09-04 ENCOUNTER — Inpatient Hospital Stay (HOSPITAL_COMMUNITY)
Admission: EM | Admit: 2015-09-04 | Discharge: 2015-09-09 | DRG: 391 | Disposition: A | Discharge status: Skilled Nursing Facility | Payer: Medicare Other | Attending: Internal Medicine | Admitting: Internal Medicine

## 2015-09-04 ENCOUNTER — Observation Stay (HOSPITAL_COMMUNITY): Payer: Medicare Other

## 2015-09-04 ENCOUNTER — Encounter (HOSPITAL_COMMUNITY): Payer: Self-pay

## 2015-09-04 ENCOUNTER — Non-Acute Institutional Stay (SKILLED_NURSING_FACILITY): Payer: Medicare Other | Admitting: Adult Health

## 2015-09-04 DIAGNOSIS — E785 Hyperlipidemia, unspecified: Secondary | ICD-10-CM | POA: Diagnosis present

## 2015-09-04 DIAGNOSIS — I2510 Atherosclerotic heart disease of native coronary artery without angina pectoris: Secondary | ICD-10-CM | POA: Diagnosis present

## 2015-09-04 DIAGNOSIS — B3781 Candidal esophagitis: Secondary | ICD-10-CM | POA: Diagnosis present

## 2015-09-04 DIAGNOSIS — E1149 Type 2 diabetes mellitus with other diabetic neurological complication: Secondary | ICD-10-CM | POA: Diagnosis present

## 2015-09-04 DIAGNOSIS — Z7902 Long term (current) use of antithrombotics/antiplatelets: Secondary | ICD-10-CM

## 2015-09-04 DIAGNOSIS — K449 Diaphragmatic hernia without obstruction or gangrene: Secondary | ICD-10-CM | POA: Diagnosis present

## 2015-09-04 DIAGNOSIS — E1165 Type 2 diabetes mellitus with hyperglycemia: Secondary | ICD-10-CM | POA: Diagnosis present

## 2015-09-04 DIAGNOSIS — E669 Obesity, unspecified: Secondary | ICD-10-CM | POA: Diagnosis present

## 2015-09-04 DIAGNOSIS — R1310 Dysphagia, unspecified: Secondary | ICD-10-CM

## 2015-09-04 DIAGNOSIS — R269 Unspecified abnormalities of gait and mobility: Secondary | ICD-10-CM | POA: Diagnosis present

## 2015-09-04 DIAGNOSIS — K2100 Gastro-esophageal reflux disease with esophagitis, without bleeding: Secondary | ICD-10-CM

## 2015-09-04 DIAGNOSIS — Z8673 Personal history of transient ischemic attack (TIA), and cerebral infarction without residual deficits: Secondary | ICD-10-CM

## 2015-09-04 DIAGNOSIS — I10 Essential (primary) hypertension: Secondary | ICD-10-CM | POA: Diagnosis present

## 2015-09-04 DIAGNOSIS — K920 Hematemesis: Secondary | ICD-10-CM | POA: Diagnosis present

## 2015-09-04 DIAGNOSIS — R112 Nausea with vomiting, unspecified: Secondary | ICD-10-CM | POA: Diagnosis present

## 2015-09-04 DIAGNOSIS — N179 Acute kidney failure, unspecified: Secondary | ICD-10-CM | POA: Diagnosis present

## 2015-09-04 DIAGNOSIS — N183 Chronic kidney disease, stage 3 (moderate): Secondary | ICD-10-CM | POA: Diagnosis present

## 2015-09-04 DIAGNOSIS — Z79891 Long term (current) use of opiate analgesic: Secondary | ICD-10-CM

## 2015-09-04 DIAGNOSIS — I129 Hypertensive chronic kidney disease with stage 1 through stage 4 chronic kidney disease, or unspecified chronic kidney disease: Secondary | ICD-10-CM | POA: Diagnosis present

## 2015-09-04 DIAGNOSIS — Z9011 Acquired absence of right breast and nipple: Secondary | ICD-10-CM

## 2015-09-04 DIAGNOSIS — K222 Esophageal obstruction: Principal | ICD-10-CM | POA: Diagnosis present

## 2015-09-04 DIAGNOSIS — I639 Cerebral infarction, unspecified: Secondary | ICD-10-CM | POA: Diagnosis present

## 2015-09-04 DIAGNOSIS — K219 Gastro-esophageal reflux disease without esophagitis: Secondary | ICD-10-CM | POA: Diagnosis present

## 2015-09-04 DIAGNOSIS — Z9049 Acquired absence of other specified parts of digestive tract: Secondary | ICD-10-CM

## 2015-09-04 DIAGNOSIS — R4702 Dysphasia: Secondary | ICD-10-CM | POA: Diagnosis present

## 2015-09-04 DIAGNOSIS — E860 Dehydration: Secondary | ICD-10-CM | POA: Diagnosis present

## 2015-09-04 DIAGNOSIS — IMO0002 Reserved for concepts with insufficient information to code with codable children: Secondary | ICD-10-CM | POA: Diagnosis present

## 2015-09-04 DIAGNOSIS — I739 Peripheral vascular disease, unspecified: Secondary | ICD-10-CM | POA: Diagnosis present

## 2015-09-04 DIAGNOSIS — Z9861 Coronary angioplasty status: Secondary | ICD-10-CM

## 2015-09-04 DIAGNOSIS — Z853 Personal history of malignant neoplasm of breast: Secondary | ICD-10-CM

## 2015-09-04 DIAGNOSIS — Z794 Long term (current) use of insulin: Secondary | ICD-10-CM

## 2015-09-04 DIAGNOSIS — K317 Polyp of stomach and duodenum: Secondary | ICD-10-CM | POA: Diagnosis present

## 2015-09-04 DIAGNOSIS — Z7982 Long term (current) use of aspirin: Secondary | ICD-10-CM

## 2015-09-04 DIAGNOSIS — E11649 Type 2 diabetes mellitus with hypoglycemia without coma: Secondary | ICD-10-CM | POA: Diagnosis present

## 2015-09-04 DIAGNOSIS — I6381 Other cerebral infarction due to occlusion or stenosis of small artery: Secondary | ICD-10-CM | POA: Insufficient documentation

## 2015-09-04 DIAGNOSIS — Z79899 Other long term (current) drug therapy: Secondary | ICD-10-CM

## 2015-09-04 DIAGNOSIS — Z6821 Body mass index (BMI) 21.0-21.9, adult: Secondary | ICD-10-CM

## 2015-09-04 DIAGNOSIS — I6330 Cerebral infarction due to thrombosis of unspecified cerebral artery: Secondary | ICD-10-CM | POA: Insufficient documentation

## 2015-09-04 DIAGNOSIS — I160 Hypertensive urgency: Secondary | ICD-10-CM | POA: Diagnosis present

## 2015-09-04 DIAGNOSIS — E1122 Type 2 diabetes mellitus with diabetic chronic kidney disease: Secondary | ICD-10-CM | POA: Diagnosis present

## 2015-09-04 LAB — PROTIME-INR
INR: 1.02 (ref 0.00–1.49)
Prothrombin Time: 13.6 seconds (ref 11.6–15.2)

## 2015-09-04 LAB — CBC
HCT: 37.6 % (ref 36.0–46.0)
Hemoglobin: 12.2 g/dL (ref 12.0–15.0)
MCH: 26.3 pg (ref 26.0–34.0)
MCHC: 32.4 g/dL (ref 30.0–36.0)
MCV: 81 fL (ref 78.0–100.0)
PLATELETS: 279 10*3/uL (ref 150–400)
RBC: 4.64 MIL/uL (ref 3.87–5.11)
RDW: 13.4 % (ref 11.5–15.5)
WBC: 8.3 10*3/uL (ref 4.0–10.5)

## 2015-09-04 LAB — CBC AND DIFFERENTIAL
HCT: 34 % — AB (ref 36–46)
HEMOGLOBIN: 11.1 g/dL — AB (ref 12.0–16.0)
Platelets: 260 10*3/uL (ref 150–399)
WBC: 7.4 10*3/mL

## 2015-09-04 LAB — BASIC METABOLIC PANEL
BUN: 73 mg/dL — AB (ref 4–21)
CREATININE: 2.7 mg/dL — AB (ref 0.5–1.1)
Glucose: 370 mg/dL
POTASSIUM: 4.5 mmol/L (ref 3.4–5.3)
SODIUM: 136 mmol/L — AB (ref 137–147)

## 2015-09-04 LAB — TYPE AND SCREEN
ABO/RH(D): B POS
Antibody Screen: NEGATIVE

## 2015-09-04 LAB — ABO/RH: ABO/RH(D): B POS

## 2015-09-04 LAB — COMPREHENSIVE METABOLIC PANEL
ALK PHOS: 112 U/L (ref 38–126)
ALT: 12 U/L — AB (ref 14–54)
AST: 33 U/L (ref 15–41)
Albumin: 3.6 g/dL (ref 3.5–5.0)
Anion gap: 16 — ABNORMAL HIGH (ref 5–15)
BILIRUBIN TOTAL: 1.3 mg/dL — AB (ref 0.3–1.2)
BUN: 73 mg/dL — ABNORMAL HIGH (ref 6–20)
CALCIUM: 10 mg/dL (ref 8.9–10.3)
CO2: 22 mmol/L (ref 22–32)
CREATININE: 2.68 mg/dL — AB (ref 0.44–1.00)
Chloride: 101 mmol/L (ref 101–111)
GFR, EST AFRICAN AMERICAN: 18 mL/min — AB (ref >60)
GFR, EST NON AFRICAN AMERICAN: 16 mL/min — AB (ref >60)
Glucose, Bld: 256 mg/dL — ABNORMAL HIGH (ref 65–99)
Potassium: 4.8 mmol/L (ref 3.5–5.1)
SODIUM: 139 mmol/L (ref 135–145)
Total Protein: 7.6 g/dL (ref 6.5–8.1)

## 2015-09-04 LAB — HEPATIC FUNCTION PANEL
ALT: 8 U/L (ref 7–35)
AST: 16 U/L (ref 13–35)
Alkaline Phosphatase: 103 U/L (ref 25–125)
Bilirubin, Total: 0.5 mg/dL

## 2015-09-04 LAB — POC OCCULT BLOOD, ED: FECAL OCCULT BLD: NEGATIVE

## 2015-09-04 MED ORDER — PANTOPRAZOLE SODIUM 40 MG IV SOLR
40.0000 mg | Freq: Once | INTRAVENOUS | Status: AC
  Administered 2015-09-04: 40 mg via INTRAVENOUS
  Filled 2015-09-04: qty 40

## 2015-09-04 MED ORDER — SODIUM CHLORIDE 0.9 % IV BOLUS (SEPSIS)
1000.0000 mL | Freq: Once | INTRAVENOUS | Status: AC
  Administered 2015-09-04: 1000 mL via INTRAVENOUS

## 2015-09-04 NOTE — Progress Notes (Signed)
Patient ID: Glenda [name], female   DOB: 1933/05/25, 82 y.o.   MRN: [id number]   Location:  [place] Room Number: 1203 Place of Service:  SNF (31)   CODE STATUS: Full Code  No Known Allergies  Chief Complaint  Patient presents with  . Acute Visit    Family Concern    HPI:  She has had vomiting this morning. Her family is concerned about her status. She is dehydrated by physical exam. She states that her food got stuck and she had to vomit it back out. She has a GI appointment for this coming Monday. I am concerned about esophageal spasm; stricture or tear. She does have heart burn.   Past Medical History  Diagnosis Date  . GERD (gastroesophageal reflux disease)   . Obesity   . DM (diabetes mellitus) ([place])   . HTN (hypertension)   . HLD (hyperlipidemia)   . CAD (coronary artery disease)   . UTI (urinary tract infection)   . Chest pain   . Dizziness   . Breast cancer ([place])   . Arthritis   . Stroke ([place])   . Shortness of breath dyspnea     Past Surgical History  Procedure Laterality Date  . Cholecystectomy    . Colon polyps; hx    . Coronary angioplasty  02/14/09  . Radioactive seed guided mastectomy with axillary sentinel lymph node biopsy Right 07/17/2015    Procedure: RADIOACTIVE SEED GUIDED PARTIAL MASTECTOMY WITH AXILLARY SENTINEL LYMPH NODE BIOPSY;  Surgeon: Erroll Luna, MD;  Location: [facility];  Service: General;  Laterality: Right;    Social History   Social History  . Marital Status: Widowed    Spouse Name: N/A  . Number of Children: 5  . Years of Education: N/A   Occupational History  . Not on file.   Social History Main Topics  . Smoking status: Never Smoker   . Smokeless tobacco: Never Used  . Alcohol Use: No  . Drug Use: No  . Sexual Activity: Not on file   Other Topics Concern  . Not on file   Social History Narrative   Retired.    Family History  Problem Relation Age of Onset  . Diabetes Mother     . Colon cancer Father     also had lung  . Diabetes    . Heart attack    . Diabetes Sister     [name]  . Breast cancer Sister       VITAL SIGNS Ht 5\' 9"  (1.753 m)  Wt 144 lb 8 oz (65.545 kg)  BMI 21.33 kg/m2  Patient's Medications  New Prescriptions   No medications on file  Previous Medications   AMLODIPINE (NORVASC) 10 MG TABLET    Take 10 mg by mouth daily.   ASPIRIN 81 MG TABLET    Take 81 mg by mouth daily.   CLONAZEPAM (KLONOPIN) 0.5 MG TABLET    Take 0.25 mg by mouth 3 (three) times daily as needed for anxiety.   CLOPIDOGREL (PLAVIX) 75 MG TABLET    Take 75 mg by mouth daily.   FUROSEMIDE (LASIX) 80 MG TABLET    Take 80 mg by mouth daily.   HUMALOG KWIKPEN 100 UNIT/ML KIWKPEN    Inject 22-34 Units into the skin 2 (two) times daily. Sliding scale as follows: 80-199 = 22 units 200-299 = 26 units 300-399 = 30 units >400 = 34 units   HYDROCODONE-ACETAMINOPHEN (NORCO) 5-325 MG TABLET  Take 1 tablet by mouth every 6 (six) hours as needed for moderate pain.   LISINOPRIL (PRINIVIL,ZESTRIL) 20 MG TABLET    Take 1 tablet (20 mg total) by mouth daily.   ONDANSETRON (ZOFRAN) 4 MG TABLET    Take 4 mg by mouth every 6 (six) hours as needed for nausea or vomiting.   PANTOPRAZOLE (PROTONIX) 40 MG TABLET    Take 40 mg by mouth 2 (two) times daily.   POTASSIUM CHLORIDE (KLOR-CON) 10 MEQ CR TABLET    Take 10 mEq by mouth daily.     ROSUVASTATIN (CRESTOR) 5 MG TABLET    Take 1 tablet (5 mg total) by mouth daily.   TOUJEO SOLOSTAR 300 UNIT/ML SOPN    Inject 30 Units into the skin every morning.   Modified Medications   No medications on file  Discontinued Medications   OMEPRAZOLE (PRILOSEC) 20 MG CAPSULE    Take 20 mg by mouth every morning. Reported on 09/04/2015     SIGNIFICANT DIAGNOSTIC EXAMS   08-24-15: mri of brain: 1. Punctate acute infarct in the right middle cerebellar peduncle. 2. Moderate chronic small vessel ischemic changes as above.   08-24-15: kub: The bowel gas  pattern is normal. No radio-opaque calculi or other significant radiographic abnormality are seen.  08-25-15: mra of head and neck: 50% stenosis of the cavernous and supraclinoid ICA segments on the LEFT, non flow reducing. No significant intracranial posterior circulation disease. No extracranial carotid bifurcation or cervical ICA lesion. Proximal RIGHT common carotid artery and proximal RIGHT vertebral artery as described above.  08-26-15: TEE: Left ventricle: The cavity size was normal. There was mild concentric hypertrophy. Systolic function was normal. The estimated ejection fraction was in the range of 60% to 65%. There was dynamic obstruction at restin the mid cavity. Wall motion was normal; there were no regional wall motion abnormalities. Doppler parameters are consistent with abnormal left ventricular relaxation (grade 1 diastolic dysfunction). Doppler parameters are consistent with high ventricular filling pressure. - Aortic valve: Transvalvular velocity was within the normal range. There was no stenosis. There was mild regurgitation. - Mitral valve: There was trivial regurgitation. - Right ventricle: The cavity size was normal. Wall thickness was normal. Systolic function was normal. - Tricuspid valve: There was mild regurgitation. - Pulmonary arteries: Systolic pressure was mildly increased. PA peak pressure: 39 mm Hg (S).    LABS REVIEWED;   08-24-15: wbc 5.6; hgb 11.5 hct 35.6; mcv 81.7; plt 241; glucose 241; bun 28; creat 1.42; k+ 3.9; na++ 145; liver normal albumin 3.2  hgb a1c 9.9  08-25-15: chol 154; ldl 70; trig 168; hdl 47; hgb a1c 10.2 08-28-15: glucose 144; bun 31; creat 1.62; k+ 3.4; na++142; phos 4.5; albumin 3.0    Review of Systems  Constitutional: Negative for malaise/fatigue.  Respiratory: Negative for cough and shortness of breath.   Cardiovascular: Negative for chest pain, palpitations and leg swelling.  Gastrointestinal: Positive for heartburn and vomiting.  Negative for abdominal pain, diarrhea and constipation.  Musculoskeletal: Negative for myalgias, back pain and joint pain.  Skin: Negative.   Neurological: Negative for dizziness.  Psychiatric/Behavioral: The patient is not nervous/anxious.    Physical Exam  Constitutional: She is oriented to person, place, and time. No distress.  Eyes: Conjunctivae are normal.  Neck: Neck supple. No JVD present. No thyromegaly present.  Cardiovascular: Normal rate, regular rhythm and intact distal pulses.   Respiratory: Effort normal and breath sounds normal. No respiratory distress. She has no wheezes.  GI:  Soft. Bowel sounds are normal. She exhibits no distension. There is no tenderness.  Musculoskeletal: She exhibits no edema.  Able to move all extremities  Has right side weakness   Lymphadenopathy:    She has no cervical adenopathy.  Neurological: She is alert and oriented to person, place, and time.  Skin: Skin is warm and dry. She is not diaphoretic.  Psychiatric: She has a normal mood and affect.     ASSESSMENT/ PLAN:  1. Gerd: food is getting stuck in her distal esophagus: will continue protonix 40 mg twice daily and will monitor her status. She has a GI appointment for 09-08-15. She has  verbalized understanding on taking small bites eating slowly avoiding hot or cold foods/fluids. Will monitor her status.        Ok Edwards NP [facility]  Contact [phone] Monday through Friday 8am- 5pm  After hours call [phone]

## 2015-09-04 NOTE — ED Notes (Signed)
Patient transported to X-ray 

## 2015-09-04 NOTE — ED Provider Notes (Signed)
CSN: [id number]     Arrival date & time 09/04/15  1933 History   First MD Initiated Contact with Patient 09/04/15 2002     Chief Complaint  Patient presents with  . GI Bleeding     (Consider location/radiation/quality/duration/timing/severity/associated sxs/prior Treatment) HPI  82 year old female with a recent stroke now on Plavix presents with vomiting. She has been having multiple episodes of emesis all day. Has had at least 2 episodes of coffee ground emesis. Has also had at least one bowel movement that was dark. No abdominal pain. No chest pain or headache. She has not been able to keep down any of her medicines today due to the vomiting. Nausea is gone after EMS gave her Zofran. No NSAID use or history of GI bleeds. Patient states she used Pepto-Bismol one week ago but has not used it since.  Past Medical History  Diagnosis Date  . GERD (gastroesophageal reflux disease)   . Obesity   . DM (diabetes mellitus) ([place])   . HTN (hypertension)   . HLD (hyperlipidemia)   . CAD (coronary artery disease)   . UTI (urinary tract infection)   . Chest pain   . Dizziness   . Breast cancer ([place])   . Arthritis   . Stroke ([place])   . Shortness of breath dyspnea    Past Surgical History  Procedure Laterality Date  . Cholecystectomy    . Colon polyps; hx    . Coronary angioplasty  02/14/09  . Radioactive seed guided mastectomy with axillary sentinel lymph node biopsy Right 07/17/2015    Procedure: RADIOACTIVE SEED GUIDED PARTIAL MASTECTOMY WITH AXILLARY SENTINEL LYMPH NODE BIOPSY;  Surgeon: Erroll Luna, MD;  Location: [place];  Service: General;  Laterality: Right;   Family History  Problem Relation Age of Onset  . Diabetes Mother   . Colon cancer Father     also had lung  . Diabetes    . Heart attack    . Diabetes Sister     [name]  . Breast cancer Sister    Social History  Substance Use Topics  . Smoking status: Never Smoker   . Smokeless tobacco: Never Used  . Alcohol Use: No    OB History    No data available     Review of Systems  Respiratory: Negative for shortness of breath.   Cardiovascular: Negative for chest pain.  Gastrointestinal: Positive for nausea, vomiting and blood in stool (black).  Neurological: Negative for headaches.  All other systems reviewed and are negative.     Allergies  Review of patient's allergies indicates no known allergies.  Home Medications   Prior to Admission medications   Medication Sig Start Date End Date Taking? Authorizing Provider  amLODipine (NORVASC) 10 MG tablet Take 10 mg by mouth daily.    Historical Provider, MD  aspirin 81 MG tablet Take 81 mg by mouth daily.    Historical Provider, MD  clonazePAM (KLONOPIN) 0.5 MG tablet Take 0.25 mg by mouth 3 (three) times daily as needed for anxiety.    Historical Provider, MD  clopidogrel (PLAVIX) 75 MG tablet Take 75 mg by mouth daily.    Historical Provider, MD  furosemide (LASIX) 80 MG tablet Take 80 mg by mouth daily.    Historical Provider, MD  HUMALOG KWIKPEN 100 UNIT/ML KiwkPen Inject 22-34 Units into the skin 2 (two) times daily. Sliding scale as follows: 80-199 = 22 units 200-299 = 26 units 300-399 = 30 units >400 = 34 units 04/30/14  Historical Provider, MD  HYDROcodone-acetaminophen (NORCO) 5-325 MG tablet Take 1 tablet by mouth every 6 (six) hours as needed for moderate pain. 07/17/15   Erroll Luna, MD  lisinopril (PRINIVIL,ZESTRIL) 20 MG tablet Take 1 tablet (20 mg total) by mouth daily. 08/28/15   Bonnell Public, MD  ondansetron (ZOFRAN) 4 MG tablet Take 4 mg by mouth every 6 (six) hours as needed for nausea or vomiting.    Historical Provider, MD  pantoprazole (PROTONIX) 40 MG tablet Take 40 mg by mouth 2 (two) times daily.    Historical Provider, MD  potassium chloride (KLOR-CON) 10 MEQ CR tablet Take 10 mEq by mouth daily.      Historical Provider, MD  rosuvastatin (CRESTOR) 5 MG tablet Take 1 tablet (5 mg total) by mouth daily. 08/28/15   Bonnell Public, MD  TOUJEO SOLOSTAR 300 UNIT/ML SOPN Inject 30 Units into the skin every morning.  11/22/14   Historical Provider, MD   BP 201/65 mmHg  Pulse 66  Temp(Src) 98.5 F (36.9 C) (Oral)  Resp 16  SpO2 97% Physical Exam  Constitutional: She is oriented to person, place, and time. She appears well-developed and well-nourished.  HENT:  Head: Normocephalic and atraumatic.  Right Ear: External ear normal.  Left Ear: External ear normal.  Nose: Nose normal.  Eyes: Right eye exhibits no discharge. Left eye exhibits no discharge.  Cardiovascular: Normal rate, regular rhythm and normal heart sounds.   Pulmonary/Chest: Effort normal and breath sounds normal.  Abdominal: Soft. She exhibits no distension. There is no tenderness.  Neurological: She is alert and oriented to person, place, and time.  Skin: Skin is warm and dry.  Nursing note and vitals reviewed.   ED Course  Procedures (including critical care time) Labs Review Labs Reviewed  COMPREHENSIVE METABOLIC PANEL - Abnormal; Notable for the following:    Glucose, Bld 256 (*)    BUN 73 (*)    Creatinine, Ser 2.68 (*)    ALT 12 (*)    Total Bilirubin 1.3 (*)    GFR calc non Af Amer 16 (*)    GFR calc Af Amer 18 (*)    Anion gap 16 (*)    All other components within normal limits  CBC  PROTIME-INR  POC OCCULT BLOOD, ED  TYPE AND SCREEN  ABO/RH    Imaging Review No results found. I have personally reviewed and evaluated these images and lab results as part of my medical decision-making.   EKG Interpretation None      MDM   Final diagnoses:  Acute kidney injury [facility])    Patient appears well, no acute vomiting currently. She was given Zofran by EMS prior to arrival. She does have evidence of an acute kidney injury. BUN is elevated, likely from prerenal disease from vomiting. However given her dark emesis there is concern for a possible GI bleed. Hemoglobin is stable. She is not tachycardic and is hypertensive, which  is likely from not taking by mouth meds this morning. However she is not showing any obvious strokelike symptoms. Discussed with GI, Dr. Selinda Michaels, will consult in the a.m. Patient to be admitted to the hospitalist. She was given IV Protonix. Hospitalist requests CT head given that she had vomiting as a main symptom of her stroke last time.    Sherwood Gambler, MD 09/05/15 0000

## 2015-09-04 NOTE — ED Notes (Signed)
Per EMS pt starting having n/v that was dark coffee grains around 1700 with loose dark stools occuring all day; Pt received 4 mg Zofran at 1900 and 5 units of Humalog at 1630; pt was placed on Plavix x 2 weeks ago; pt denies pain on arrival. Pt had lymph nodes removed on right side; Pt has hx of stroke the end of April with right sided weakness; pt is from [facility]. Pt states she does not walk but a&ox 4 on arrival.

## 2015-09-04 NOTE — ED Notes (Signed)
MD at bedside. 

## 2015-09-04 NOTE — ED Notes (Signed)
RN attempted to call report to floor; RN to call back  

## 2015-09-05 ENCOUNTER — Ambulatory Visit: Payer: Medicare Other

## 2015-09-05 ENCOUNTER — Ambulatory Visit: Payer: Medicare Other | Admitting: Radiation Oncology

## 2015-09-05 ENCOUNTER — Encounter (HOSPITAL_COMMUNITY): Payer: Self-pay | Admitting: Internal Medicine

## 2015-09-05 ENCOUNTER — Inpatient Hospital Stay (HOSPITAL_COMMUNITY): Payer: Medicare Other

## 2015-09-05 DIAGNOSIS — Z7982 Long term (current) use of aspirin: Secondary | ICD-10-CM | POA: Diagnosis not present

## 2015-09-05 DIAGNOSIS — I129 Hypertensive chronic kidney disease with stage 1 through stage 4 chronic kidney disease, or unspecified chronic kidney disease: Secondary | ICD-10-CM | POA: Diagnosis present

## 2015-09-05 DIAGNOSIS — I6330 Cerebral infarction due to thrombosis of unspecified cerebral artery: Secondary | ICD-10-CM | POA: Diagnosis not present

## 2015-09-05 DIAGNOSIS — K317 Polyp of stomach and duodenum: Secondary | ICD-10-CM | POA: Diagnosis present

## 2015-09-05 DIAGNOSIS — E1122 Type 2 diabetes mellitus with diabetic chronic kidney disease: Secondary | ICD-10-CM | POA: Diagnosis present

## 2015-09-05 DIAGNOSIS — Z9861 Coronary angioplasty status: Secondary | ICD-10-CM | POA: Diagnosis not present

## 2015-09-05 DIAGNOSIS — E11649 Type 2 diabetes mellitus with hypoglycemia without coma: Secondary | ICD-10-CM | POA: Diagnosis present

## 2015-09-05 DIAGNOSIS — I10 Essential (primary) hypertension: Secondary | ICD-10-CM | POA: Diagnosis not present

## 2015-09-05 DIAGNOSIS — Z7902 Long term (current) use of antithrombotics/antiplatelets: Secondary | ICD-10-CM | POA: Diagnosis not present

## 2015-09-05 DIAGNOSIS — E1149 Type 2 diabetes mellitus with other diabetic neurological complication: Secondary | ICD-10-CM | POA: Diagnosis present

## 2015-09-05 DIAGNOSIS — I2510 Atherosclerotic heart disease of native coronary artery without angina pectoris: Secondary | ICD-10-CM | POA: Diagnosis present

## 2015-09-05 DIAGNOSIS — Z9049 Acquired absence of other specified parts of digestive tract: Secondary | ICD-10-CM | POA: Diagnosis not present

## 2015-09-05 DIAGNOSIS — K449 Diaphragmatic hernia without obstruction or gangrene: Secondary | ICD-10-CM | POA: Diagnosis present

## 2015-09-05 DIAGNOSIS — N183 Chronic kidney disease, stage 3 (moderate): Secondary | ICD-10-CM | POA: Diagnosis present

## 2015-09-05 DIAGNOSIS — K222 Esophageal obstruction: Secondary | ICD-10-CM | POA: Diagnosis present

## 2015-09-05 DIAGNOSIS — R112 Nausea with vomiting, unspecified: Secondary | ICD-10-CM | POA: Diagnosis present

## 2015-09-05 DIAGNOSIS — E785 Hyperlipidemia, unspecified: Secondary | ICD-10-CM | POA: Diagnosis present

## 2015-09-05 DIAGNOSIS — R4702 Dysphasia: Secondary | ICD-10-CM | POA: Diagnosis present

## 2015-09-05 DIAGNOSIS — Z9011 Acquired absence of right breast and nipple: Secondary | ICD-10-CM | POA: Diagnosis not present

## 2015-09-05 DIAGNOSIS — N179 Acute kidney failure, unspecified: Secondary | ICD-10-CM | POA: Diagnosis present

## 2015-09-05 DIAGNOSIS — E1165 Type 2 diabetes mellitus with hyperglycemia: Secondary | ICD-10-CM | POA: Diagnosis present

## 2015-09-05 DIAGNOSIS — Z79891 Long term (current) use of opiate analgesic: Secondary | ICD-10-CM | POA: Diagnosis not present

## 2015-09-05 DIAGNOSIS — R269 Unspecified abnormalities of gait and mobility: Secondary | ICD-10-CM | POA: Diagnosis present

## 2015-09-05 DIAGNOSIS — Z6821 Body mass index (BMI) 21.0-21.9, adult: Secondary | ICD-10-CM | POA: Diagnosis not present

## 2015-09-05 DIAGNOSIS — I739 Peripheral vascular disease, unspecified: Secondary | ICD-10-CM | POA: Diagnosis present

## 2015-09-05 DIAGNOSIS — Z853 Personal history of malignant neoplasm of breast: Secondary | ICD-10-CM | POA: Diagnosis not present

## 2015-09-05 DIAGNOSIS — Z8673 Personal history of transient ischemic attack (TIA), and cerebral infarction without residual deficits: Secondary | ICD-10-CM | POA: Diagnosis not present

## 2015-09-05 DIAGNOSIS — B3781 Candidal esophagitis: Secondary | ICD-10-CM | POA: Diagnosis present

## 2015-09-05 DIAGNOSIS — Z79899 Other long term (current) drug therapy: Secondary | ICD-10-CM | POA: Diagnosis not present

## 2015-09-05 DIAGNOSIS — E860 Dehydration: Secondary | ICD-10-CM | POA: Diagnosis present

## 2015-09-05 DIAGNOSIS — Z794 Long term (current) use of insulin: Secondary | ICD-10-CM | POA: Diagnosis not present

## 2015-09-05 DIAGNOSIS — K219 Gastro-esophageal reflux disease without esophagitis: Secondary | ICD-10-CM | POA: Diagnosis present

## 2015-09-05 DIAGNOSIS — I160 Hypertensive urgency: Secondary | ICD-10-CM | POA: Diagnosis present

## 2015-09-05 DIAGNOSIS — K920 Hematemesis: Secondary | ICD-10-CM | POA: Diagnosis present

## 2015-09-05 DIAGNOSIS — I6320 Cerebral infarction due to unspecified occlusion or stenosis of unspecified precerebral arteries: Secondary | ICD-10-CM | POA: Diagnosis not present

## 2015-09-05 DIAGNOSIS — E669 Obesity, unspecified: Secondary | ICD-10-CM | POA: Diagnosis present

## 2015-09-05 DIAGNOSIS — I639 Cerebral infarction, unspecified: Secondary | ICD-10-CM | POA: Diagnosis present

## 2015-09-05 LAB — CBC
HCT: 31.8 % — ABNORMAL LOW (ref 36.0–46.0)
HCT: 32.1 % — ABNORMAL LOW (ref 36.0–46.0)
HEMOGLOBIN: 10.3 g/dL — AB (ref 12.0–15.0)
Hemoglobin: 10.6 g/dL — ABNORMAL LOW (ref 12.0–15.0)
MCH: 27.1 pg (ref 26.0–34.0)
MCH: 26.6 pg (ref 26.0–34.0)
MCHC: 33.3 g/dL (ref 30.0–36.0)
MCHC: 32.1 g/dL (ref 30.0–36.0)
MCV: 81.3 fL (ref 78.0–100.0)
MCV: 82.9 fL (ref 78.0–100.0)
PLATELETS: 190 10*3/uL (ref 150–400)
PLATELETS: 215 10*3/uL (ref 150–400)
RBC: 3.91 MIL/uL (ref 3.87–5.11)
RBC: 3.87 MIL/uL (ref 3.87–5.11)
RDW: 13.5 % (ref 11.5–15.5)
RDW: 13.3 % (ref 11.5–15.5)
WBC: 7 10*3/uL (ref 4.0–10.5)
WBC: 6.4 10*3/uL (ref 4.0–10.5)

## 2015-09-05 LAB — COMPREHENSIVE METABOLIC PANEL
ALBUMIN: 2.8 g/dL — AB (ref 3.5–5.0)
ALT: 10 U/L — ABNORMAL LOW (ref 14–54)
ANION GAP: 14 (ref 5–15)
AST: 17 U/L (ref 15–41)
Alkaline Phosphatase: 88 U/L (ref 38–126)
BUN: 60 mg/dL — ABNORMAL HIGH (ref 6–20)
CHLORIDE: 107 mmol/L (ref 101–111)
CO2: 25 mmol/L (ref 22–32)
Calcium: 9.2 mg/dL (ref 8.9–10.3)
Creatinine, Ser: 2.09 mg/dL — ABNORMAL HIGH (ref 0.44–1.00)
GFR calc non Af Amer: 21 mL/min — ABNORMAL LOW (ref >60)
GFR, EST AFRICAN AMERICAN: 25 mL/min — AB (ref >60)
GLUCOSE: 132 mg/dL — AB (ref 65–99)
POTASSIUM: 3.6 mmol/L (ref 3.5–5.1)
SODIUM: 146 mmol/L — AB (ref 135–145)
Total Bilirubin: 0.6 mg/dL (ref 0.3–1.2)
Total Protein: 6 g/dL — ABNORMAL LOW (ref 6.5–8.1)

## 2015-09-05 LAB — GLUCOSE, CAPILLARY
GLUCOSE-CAPILLARY: 124 mg/dL — AB (ref 65–99)
GLUCOSE-CAPILLARY: 190 mg/dL — AB (ref 65–99)
GLUCOSE-CAPILLARY: 232 mg/dL — AB (ref 65–99)
Glucose-Capillary: 106 mg/dL — ABNORMAL HIGH (ref 65–99)
Glucose-Capillary: 109 mg/dL — ABNORMAL HIGH (ref 65–99)
Glucose-Capillary: 271 mg/dL — ABNORMAL HIGH (ref 65–99)
Glucose-Capillary: 322 mg/dL — ABNORMAL HIGH (ref 65–99)

## 2015-09-05 LAB — TROPONIN I: TROPONIN I: 0.03 ng/mL (ref <0.031)

## 2015-09-05 LAB — MRSA PCR SCREENING: MRSA by PCR: NEGATIVE

## 2015-09-05 MED ORDER — CLONIDINE HCL 0.2 MG/24HR TD PTWK
0.2000 mg | MEDICATED_PATCH | TRANSDERMAL | Status: DC
  Administered 2015-09-05: 0.2 mg via TRANSDERMAL
  Filled 2015-09-05: qty 1

## 2015-09-05 MED ORDER — SODIUM CHLORIDE 0.9 % IV SOLN
INTRAVENOUS | Status: DC
  Administered 2015-09-05 (×2): via INTRAVENOUS

## 2015-09-05 MED ORDER — CETYLPYRIDINIUM CHLORIDE 0.05 % MT LIQD
7.0000 mL | Freq: Two times a day (BID) | OROMUCOSAL | Status: DC
  Administered 2015-09-05 – 2015-09-09 (×6): 7 mL via OROMUCOSAL

## 2015-09-05 MED ORDER — HYDRALAZINE HCL 20 MG/ML IJ SOLN
10.0000 mg | INTRAMUSCULAR | Status: DC | PRN
Start: 2015-09-05 — End: 2015-09-05

## 2015-09-05 MED ORDER — STROKE: EARLY STAGES OF RECOVERY BOOK
Freq: Once | Status: AC
  Administered 2015-09-05: 01:00:00
  Filled 2015-09-05: qty 1

## 2015-09-05 MED ORDER — BOOST / RESOURCE BREEZE PO LIQD
1.0000 | Freq: Two times a day (BID) | ORAL | Status: DC
  Administered 2015-09-05 – 2015-09-06 (×2): 1 via ORAL

## 2015-09-05 MED ORDER — PANTOPRAZOLE SODIUM 40 MG IV SOLR
40.0000 mg | Freq: Two times a day (BID) | INTRAVENOUS | Status: DC
  Administered 2015-09-05 – 2015-09-09 (×9): 40 mg via INTRAVENOUS
  Filled 2015-09-05 (×9): qty 40

## 2015-09-05 MED ORDER — STROKE: EARLY STAGES OF RECOVERY BOOK
Freq: Once | Status: AC
  Administered 2015-09-05: 13:00:00
  Filled 2015-09-05: qty 1

## 2015-09-05 MED ORDER — METOPROLOL TARTRATE 5 MG/5ML IV SOLN
5.0000 mg | Freq: Four times a day (QID) | INTRAVENOUS | Status: DC
  Administered 2015-09-05 (×3): 5 mg via INTRAVENOUS
  Filled 2015-09-05 (×3): qty 5

## 2015-09-05 MED ORDER — INSULIN ASPART 100 UNIT/ML ~~LOC~~ SOLN
0.0000 [IU] | SUBCUTANEOUS | Status: DC
  Administered 2015-09-05: 3 [IU] via SUBCUTANEOUS
  Administered 2015-09-05: 7 [IU] via SUBCUTANEOUS
  Administered 2015-09-05: 1 [IU] via SUBCUTANEOUS
  Administered 2015-09-05: 5 [IU] via SUBCUTANEOUS
  Administered 2015-09-05: 2 [IU] via SUBCUTANEOUS
  Administered 2015-09-06: 5 [IU] via SUBCUTANEOUS
  Administered 2015-09-06: 3 [IU] via SUBCUTANEOUS
  Administered 2015-09-06: 12 [IU] via SUBCUTANEOUS
  Administered 2015-09-07: 2 [IU] via SUBCUTANEOUS
  Administered 2015-09-07: 3 [IU] via SUBCUTANEOUS
  Administered 2015-09-07: 2 [IU] via SUBCUTANEOUS
  Administered 2015-09-07: 3 [IU] via SUBCUTANEOUS
  Administered 2015-09-07: 2 [IU] via SUBCUTANEOUS
  Administered 2015-09-08 (×2): 5 [IU] via SUBCUTANEOUS
  Administered 2015-09-08: 1 [IU] via SUBCUTANEOUS
  Administered 2015-09-08 – 2015-09-09 (×2): 2 [IU] via SUBCUTANEOUS
  Administered 2015-09-09: 3 [IU] via SUBCUTANEOUS
  Administered 2015-09-09: 2 [IU] via SUBCUTANEOUS

## 2015-09-05 MED ORDER — METOPROLOL TARTRATE 5 MG/5ML IV SOLN
5.0000 mg | Freq: Four times a day (QID) | INTRAVENOUS | Status: DC
  Administered 2015-09-05 – 2015-09-06 (×4): 5 mg via INTRAVENOUS
  Filled 2015-09-05 (×4): qty 5

## 2015-09-05 MED ORDER — INSULIN GLARGINE 100 UNIT/ML ~~LOC~~ SOLN
5.0000 [IU] | Freq: Every day | SUBCUTANEOUS | Status: DC
  Administered 2015-09-05 – 2015-09-06 (×2): 5 [IU] via SUBCUTANEOUS
  Filled 2015-09-05 (×2): qty 0.05

## 2015-09-05 MED ORDER — HYDRALAZINE HCL 20 MG/ML IJ SOLN
10.0000 mg | INTRAMUSCULAR | Status: DC | PRN
  Administered 2015-09-05: 10 mg via INTRAVENOUS
  Filled 2015-09-05: qty 1

## 2015-09-05 NOTE — Progress Notes (Addendum)
Initial Nutrition Assessment  DOCUMENTATION CODES:   Not applicable  INTERVENTION:   -Boost Breeze po BID, each supplement provides 250 kcal and 9 grams of protein  NUTRITION DIAGNOSIS:   Inadequate oral intake related to poor appetite as evidenced by per patient/family report.  GOAL:   Patient will meet greater than or equal to 90% of their needs  MONITOR:   PO intake, Supplement acceptance, Diet advancement, Labs, Weight trends, Skin, I & O's  REASON FOR ASSESSMENT:   Malnutrition Screening Tool    ASSESSMENT:   Glenda [name] is a 82 y.o. female with medical history significant of recent cerebellar stroke, hypertension, diabetes mellitus, breast cancer was brought to the ER after patient had persistent nausea vomiting  Pt admitted with intractable nausea and vomiting and possible GIB.   Hx obtained from pt at bedside. She reports a general decline in health over the past month, after suffering a stroke. She reveals that her intake has declined PTA, secondary to dysphagia to solids. She denies any difficulty with liquids, however, has difficulty swallowing harder consistency foods, such as lettuce.   Pt reports UBW of 180#. She reports a 35# wt loss over an unknown time frame. Reviewed wt hx, which reveals distant wt loss. Noted mild wt loss over the past years, however, no changes significant for time frame.   Nutrition-Focused physical exam completed. Findings are no fat depletion, mild muscle depletion, and no edema.   Pt is NPO, but advanced to clear liquids after RD visit. Pt reports she is amenable to nutritional supplements once diet is advanced.   Case discussed with RN. Confirms that pt will likely undergo barium swallow today per GI note.  Labs reviewed: Na: 146, CBGS: 124.   Diet Order:  Diet clear liquid Room service appropriate?: Yes; Fluid consistency:: Thin Diet NPO time specified  Skin:  Reviewed, no issues  Last BM:  09/04/15  Height:   Ht  Readings from Last 1 Encounters:  09/04/15 5\' 9"  (1.753 m)    Weight:   Wt Readings from Last 1 Encounters:  09/04/15 144 lb 8 oz (65.545 kg)    Ideal Body Weight:  65.9 kg  BMI:  Estimated body mass index is 21.33 kg/(m^2) as calculated from the following:   Height as of an earlier encounter on 09/04/15: 5\' 9"  (1.753 m).   Weight as of an earlier encounter on 09/04/15: 144 lb 8 oz (65.545 kg).  Estimated Nutritional Needs:   Kcal:  1600-1800  Protein:  75-90 grams  Fluid:  1.6-1.8 L  EDUCATION NEEDS:   Education needs addressed  Kyrra Prada A. Jimmye Norman, RD, LDN, CDE Pager: [phone] After hours Pager: [phone]

## 2015-09-05 NOTE — Progress Notes (Signed)
PROGRESS NOTE        PATIENT DETAILS Name: Glenda [name] Age: 82 y.o. Sex: female Date of Birth: December 14, 1933 Admit Date: 09/04/2015 Admitting Physician Rise Patience, MD [id number] Alroy Dust, MD  Brief Narrative: Patient is a 82 y.o. female with recent right cerebellar CVA brought back to the hospital from skilled nursing facility for evaluation of nausea and vomiting. Further evaluation positive for distal esophageal stricture, and MRI brain positive for extension of her right cerebellar infarct, and a new small foci of infarct right lateral thalamus.  Subjective: No further vomiting since admission.  Assessment/Plan: Principal Problem: Nausea & vomiting: Etiology not certain-not sure if right cerebellar infarct can account for these symptoms. Alternatively, could be from high-grade esophageal stricture seen on barium esophagogram. Downgraded to clear liquid diet, supportive care, gastroenterology following and planning on EGD tomorrow.  Active Problems: Coffee-ground emesis: Doubt active GI bleeding, could have had a small mucosal tear. Seems to have resolved spontaneously. Hemoglobin appears to be stable, continue PPI, GI planning on endoscopy tomorrow  Dysphagia: Patient expresses both solid and liquid dysphagia-doubt this is related to CVA-suspect mostly related to esophageal stricture. GI following, plans are for EGD tomorrow.  Extension of recent right cerebellar infarct, and now new acute thalamic infarct: Although could be embolic, suspect likely secondary to dehydration-causing ischemia-due to underlying small vessel disease. Antiplatelets currently on hold, resume if EGD negative for significant abnormalities. Will not repeat workup as just recently completed, will consult neurology. Note-telemetry unremarkable so far.  Acute kidney injury on chronic kidney disease stage III: AKI likely secondary to prerenal azotemia in a setting of  dehydration-creatinine improving with IV fluids.  Type 2 diabetes: CBGs currently stable, continue 5 units of Lantus and SSI. As diet slowly advances, will slowly place back in 2 regular insulin regimen.  Uncontrolled hypertension: Allow permissive hypertension-especially in the setting of extension of cerebellar infarct-for now-cautiously continue transdermal clonidine, scheduled IV Lopressor-holding parameters placed. Slowly resume usual antihypertensives once EGD and other workup has been completed.  History of breast cancer: Being followed by Dr. Lindi Adie in the outpatient setting-encouraged her to keep her next appointment.  DVT Prophylaxis: SCD's  Code Status: Full code  Family Communication: Daughter at bedside  Disposition Plan: Remain inpatient-but will plan on Home health vs SNF on discharge  Antimicrobial agents: None  Procedures: None  CONSULTS:  GI and neurology  Time spent: 25- minutes-Greater than 50% of this time was spent in counseling, explanation of diagnosis, planning of further management, and coordination of care.  MEDICATIONS: Anti-infectives    None      Scheduled Meds: .  stroke: mapping our early stages of recovery book   Does not apply Once  . antiseptic oral rinse  7 mL Mouth Rinse BID  . cloNIDine  0.2 mg Transdermal Q Thu  . insulin aspart  0-9 Units Subcutaneous Q4H  . insulin glargine  5 Units Subcutaneous Daily  . metoprolol  5 mg Intravenous Q6H  . pantoprazole (PROTONIX) IV  40 mg Intravenous Q12H   Continuous Infusions: . sodium chloride 100 mL/hr at 09/05/15 1246   PRN Meds:.hydrALAZINE   PHYSICAL EXAM: Vital signs: Filed Vitals:   09/04/15 2300 09/05/15 0035 09/05/15 0516 09/05/15 0700  BP: 190/68 199/62 194/57 180/57  Pulse: 66 71 65   Temp:  98.4 F (36.9 C) 98.7 F (37.1 C)  TempSrc:  Oral Oral   Resp: 13 18 18    SpO2: 99% 100% 99%    There were no vitals filed for this visit. There is no weight on file to  calculate BMI.   Gen Exam: Awake and alert with clear speech. Not in any distress  Neck: Supple, No JVD.   Chest: B/L Clear.   CVS: S1 S2 Regular, no murmurs.  Abdomen: soft, BS +, non tender, non distended.  Extremities: no edema, lower extremities warm to touch. Neurologic: Non Focal-But does have some mild coordination issues in the right upper and right lower extremity Skin: No Rash or lesions   Wounds: N/A.    LABORATORY DATA: CBC:  Recent Labs Lab 09/04/15 1940 09/05/15 0245 09/05/15 0622  WBC 8.3 7.0 6.4  HGB 12.2 10.6* 10.3*  HCT 37.6 31.8* 32.1*  MCV 81.0 81.3 82.9  PLT 279 190 [id number]    Basic Metabolic Panel:  Recent Labs Lab 09/04/15 1940 09/05/15 0622  NA 139 146*  K 4.8 3.6  CL 101 107  CO2 22 25  GLUCOSE 256* 132*  BUN 73* 60*  CREATININE 2.68* 2.09*  CALCIUM 10.0 9.2    GFR: Estimated Creatinine Clearance: 22.6 mL/min (by C-G formula based on Cr of 2.09).  Liver Function Tests:  Recent Labs Lab 09/04/15 1940 09/05/15 0622  AST 33 17  ALT 12* 10*  ALKPHOS 112 88  BILITOT 1.3* 0.6  PROT 7.6 6.0*  ALBUMIN 3.6 2.8*   No results for input(s): LIPASE, AMYLASE in the last 168 hours. No results for input(s): AMMONIA in the last 168 hours.  Coagulation Profile:  Recent Labs Lab 09/04/15 2038  INR 1.02    Cardiac Enzymes:  Recent Labs Lab 09/05/15 0711  TROPONINI 0.03    BNP (last 3 results) No results for input(s): PROBNP in the last 8760 hours.  HbA1C: No results for input(s): HGBA1C in the last 72 hours.  CBG:  Recent Labs Lab 09/05/15 0109 09/05/15 0614 09/05/15 0734 09/05/15 1154  GLUCAP 190* 124* 106* 109*    Lipid Profile: No results for input(s): CHOL, HDL, LDLCALC, TRIG, CHOLHDL, LDLDIRECT in the last 72 hours.  Thyroid Function Tests: No results for input(s): TSH, T4TOTAL, FREET4, T3FREE, THYROIDAB in the last 72 hours.  Anemia Panel: No results for input(s): VITAMINB12, FOLATE, FERRITIN, TIBC, IRON,  RETICCTPCT in the last 72 hours.  Urine analysis:    Component Value Date/Time   COLORURINE YELLOW 08/24/2015 1824   APPEARANCEUR CLEAR 08/24/2015 1824   LABSPEC 1.018 08/24/2015 1824   PHURINE 5.0 08/24/2015 1824   GLUCOSEU 250* 08/24/2015 1824   HGBUR NEGATIVE 08/24/2015 1824   BILIRUBINUR NEGATIVE 08/24/2015 1824   KETONESUR NEGATIVE 08/24/2015 1824   PROTEINUR 100* 08/24/2015 1824   UROBILINOGEN 0.2 07/04/2014 1900   NITRITE NEGATIVE 08/24/2015 1824   LEUKOCYTESUR NEGATIVE 08/24/2015 1824    Sepsis Labs: Lactic Acid, Venous No results found for: LATICACIDVEN  MICROBIOLOGY: Recent Results (from the past 240 hour(s))  MRSA PCR Screening     Status: None   Collection Time: 09/05/15  1:44 AM  Result Value Ref Range Status   MRSA by PCR NEGATIVE NEGATIVE Final    Comment:        The GeneXpert MRSA Assay (FDA approved for NASAL specimens only), is one component of a comprehensive MRSA colonization surveillance program. It is not intended to diagnose MRSA infection nor to guide or monitor treatment for MRSA infections.     RADIOLOGY STUDIES/RESULTS: Ct Head Wo  Contrast  09/05/2015  CLINICAL DATA:  Vomiting. EXAM: CT HEAD WITHOUT CONTRAST TECHNIQUE: Contiguous axial images were obtained from the base of the skull through the vertex without intravenous contrast. COMPARISON:  08/24/2015 FINDINGS: Focal hypodensity in the right middle cerebellar peduncle directly lateral to the fourth ventricle corresponds to the area of infarction observed on 08/24/2015. This may represent edema. This appears slightly larger than the area of infarction observed on 08/24/2015, and possibility of extension of the infarct is a consideration. There is no hemorrhagic transformation. There is no intracranial hemorrhage. There is no extra-axial fluid collection. There is mild generalized atrophy. There is white matter hypodensity which is chronic and likely due to small vessel disease. Hypodensities in  the left pons may represent remote lacunar infarctions. There also is remote lacunar infarction in the left internal capsule posterior limb. No bony abnormalities. The visible paranasal sinuses and orbits are unremarkable. IMPRESSION: Focal hypodensity at the right lateral aspect of the fourth ventricle, corresponding to the location of the right cerebellar peduncle infarction observed on 08/24/2015 and likely representing edema. This could represent extension of that prior infarction. No hemorrhage. No other significant interval change. There is mild generalized atrophy and chronic white matter hypodensity which likely represents small vessel ischemic disease. MRI will be the modality of choice if additional imaging is clinically warranted. Electronically Signed   By: Andreas Newport M.D.   On: 09/05/2015 00:54   Mr Jodene Nam Head Wo Contrast  08/25/2015  CLINICAL DATA:  Continued surveillance of acute posterior circulation infarct. EXAM: MRA NECK WITHOUT AND WITH CONTRAST MRA HEAD WITHOUT CONTRAST TECHNIQUE: Multiplanar and multiecho pulse sequences of the neck were obtained without and with intravenous contrast. Angiographic images of the neck were obtained using MRA technique without and with intravenous contrast.; Angiographic images of the Circle of Willis were obtained using MRA technique without intravenous contrast. CONTRAST:  15mL MULTIHANCE GADOBENATE DIMEGLUMINE 529 MG/ML IV SOLN COMPARISON:  MR brain 08/24/2015. FINDINGS: MRA NECK FINDINGS Dolichoectasia proximal vasculature. Conventional branching of the great vessels from the arch. The proximal RIGHT common carotid artery demonstrates a estimated 75% stenosis. No flow-limiting disease at the bifurcation or throughout the course of the visualized RIGHT cervical ICA. Poorly visualized proximal LEFT common carotid artery without definite proximal narrowing. No significant atheromatous change at the bifurcation. Cervical ICA widely patent on the LEFT. Both  vertebral arteries are tortuous. They are equal in size. No definite ostial lesion. There may be a 75% stenosis of the proximal RIGHT vertebral 2 cm above its origin. MRA HEAD FINDINGS Mild non stenotic irregularity of the cavernous and supraclinoid ICA segments on the RIGHT. 50% smooth stenosis of the cavernous and supraclinoid ICA segments on the LEFT. No proximal stenosis of the anterior or middle cerebral arteries. No MCA branch occlusion. No visible intracranial aneurysm. Basilar artery is widely patent. Both vertebrals contribute equally to its formation. There is no significant proximal PCA disease or cerebellar branch occlusion. IMPRESSION: 50% stenosis of the cavernous and supraclinoid ICA segments on the LEFT, non flow reducing. No significant intracranial posterior circulation disease. No extracranial carotid bifurcation or cervical ICA lesion. Proximal RIGHT common carotid artery and proximal RIGHT vertebral artery as described above. Electronically Signed   By: Staci Righter M.D.   On: 08/25/2015 09:06   Mr Angiogram Neck W Wo Contrast  08/25/2015  CLINICAL DATA:  Continued surveillance of acute posterior circulation infarct. EXAM: MRA NECK WITHOUT AND WITH CONTRAST MRA HEAD WITHOUT CONTRAST TECHNIQUE: Multiplanar and multiecho pulse sequences  of the neck were obtained without and with intravenous contrast. Angiographic images of the neck were obtained using MRA technique without and with intravenous contrast.; Angiographic images of the Circle of Willis were obtained using MRA technique without intravenous contrast. CONTRAST:  87mL MULTIHANCE GADOBENATE DIMEGLUMINE 529 MG/ML IV SOLN COMPARISON:  MR brain 08/24/2015. FINDINGS: MRA NECK FINDINGS Dolichoectasia proximal vasculature. Conventional branching of the great vessels from the arch. The proximal RIGHT common carotid artery demonstrates a estimated 75% stenosis. No flow-limiting disease at the bifurcation or throughout the course of the visualized  RIGHT cervical ICA. Poorly visualized proximal LEFT common carotid artery without definite proximal narrowing. No significant atheromatous change at the bifurcation. Cervical ICA widely patent on the LEFT. Both vertebral arteries are tortuous. They are equal in size. No definite ostial lesion. There may be a 75% stenosis of the proximal RIGHT vertebral 2 cm above its origin. MRA HEAD FINDINGS Mild non stenotic irregularity of the cavernous and supraclinoid ICA segments on the RIGHT. 50% smooth stenosis of the cavernous and supraclinoid ICA segments on the LEFT. No proximal stenosis of the anterior or middle cerebral arteries. No MCA branch occlusion. No visible intracranial aneurysm. Basilar artery is widely patent. Both vertebrals contribute equally to its formation. There is no significant proximal PCA disease or cerebellar branch occlusion. IMPRESSION: 50% stenosis of the cavernous and supraclinoid ICA segments on the LEFT, non flow reducing. No significant intracranial posterior circulation disease. No extracranial carotid bifurcation or cervical ICA lesion. Proximal RIGHT common carotid artery and proximal RIGHT vertebral artery as described above. Electronically Signed   By: Staci Righter M.D.   On: 08/25/2015 09:06   Mr Brain Wo Contrast  09/05/2015  CLINICAL DATA:  82 year old female with small right cerebellar infarct in April. Persistent nausea vomiting, recurrent dizziness. Initial encounter. EXAM: MRI HEAD WITHOUT CONTRAST TECHNIQUE: Multiplanar, multiecho pulse sequences of the brain and surrounding structures were obtained without intravenous contrast. COMPARISON:  Head CT without contrast midnight today. Brain MRI 08/24/2015 and earlier. FINDINGS: Major intracranial vascular flow voids are stable. There has been enlargement of the restricted diffusion along the right lateral aspect of the fourth ventricle in the right cerebellar hemisphere since April. The area now encompasses 9-10 mm, versus 3 mm  previously. See series 5, image 14 and compare to series 4, image 15 previously. No other brainstem or cerebellar restricted diffusion. However, there is new curvilinear restricted diffusion in the lateral right thalamus near the posterior limb of the right internal capsule. Diffusion imaging elsewhere is stable. Mild T2 and FLAIR corresponding to the acute diffusion findings with no mass effect. There does appear to be a new punctate micro hemorrhage along the caudal aspect of the right thalamic diffusion abnormality as seen on series 9, image 45. There is underlying advanced chronic small vessel disease in the brainstem and thalami I with multiple bilateral chronic lacunar infarcts. The left midbrain also is chronically affected. Aside from the acute findings the cerebellum is relatively spared. Patchy supratentorial white matter T2 and FLAIR hyperintensity is stable with no supratentorial cortical encephalomalacia. Scattered chronic micro hemorrhages are re- demonstrated in the posterior left hemisphere and brainstem. No midline shift, mass effect, evidence of mass lesion, ventriculomegaly, or other acute intracranial hemorrhage. Cervicomedullary junction and pituitary are within normal limits. Stable visualized cervical spine. Bone marrow signal is stable and within normal limits. Stable visualized internal auditory structures. Mastoids are clear. Trace paranasal sinus mucosal thickening is stable. Orbit and scalp soft tissues are stable. IMPRESSION: 1.  Mild acute extension of the lacunar type right cerebellar infarct along the lateral wall of the fourth ventricle. No associated hemorrhage or mass effect. 2. Superimposed acute lacunar infarct in the lateral right thalamus with associated microhemorrhage. No mass effect. 3. Otherwise stable advanced chronic small vessel disease in the brainstem and thalami. Electronically Signed   By: Genevie Ann M.D.   On: 09/05/2015 11:19   Mr Brain Wo Contrast  08/24/2015   CLINICAL DATA:  Ataxia with nausea and vomiting since yesterday. Assessment for stroke. EXAM: MRI HEAD WITHOUT CONTRAST TECHNIQUE: Multiplanar, multiecho pulse sequences of the brain and surrounding structures were obtained without intravenous contrast. COMPARISON:  07/04/2014 FINDINGS: There is a 4 mm acute infarct in the medial aspect of the right middle cerebellar peduncle. No intracranial hemorrhage, mass, midline shift, or extra-axial fluid collection is identified. Small chronic infarcts are seen in the pons and thalami. A chronic infarct in the posterior limb of the internal capsule was acute on the prior MRI. There is new wallerian degeneration along the left corticospinal tract in the cerebral peduncle. There is mild cerebral atrophy which is unchanged. Foci of T2 hyperintensity throughout the cerebral white matter and pons are similar to the prior MRI and nonspecific but compatible with moderate chronic small vessel ischemic disease. Prior bilateral cataract extraction is noted. There is a trace right mastoid effusion, and there is minimal left anterior ethmoid air cell mucosal thickening. Major intracranial vascular flow voids are preserved. IMPRESSION: 1. Punctate acute infarct in the right middle cerebellar peduncle. 2. Moderate chronic small vessel ischemic changes as above. Electronically Signed   By: Logan Bores M.D.   On: 08/24/2015 13:18   Dg Esophagus  09/05/2015  CLINICAL DATA:  82 year old with recent stroke and chronic intermittent dysphagia, primarily for solids and pills. EXAM: ESOPHOGRAM/BARIUM SWALLOW TECHNIQUE: Single contrast examination was performed using  thin barium. FLUOROSCOPY TIME:  Radiation Exposure Index (as provided by the fluoroscopic device): 96.5 uGy*m2 If the device does not provide the exposure index: Fluoroscopy Time:  1 minutes. Number of Acquired Images: 1 spot image. Remainder of study obtained with fluoro store. COMPARISON:  None. FINDINGS: Limited study was  performed with the patient in a semi erect position. Patient swallowed the contrast without difficulty. No aspiration was observed. There is moderate esophageal dysmotility with a decreased primary stripping wave and diffuse tertiary contractions. There is fixed luminal narrowing of the distal esophagus extending over approximately 1.4 cm. No definite mucosal ulceration is seen in this area. Some contrast did pass into the stomach. A 13 mm barium tablet was administered. This passed into the distal esophagus, but not through the distal esophageal stricture. IMPRESSION: 1. High-grade distal esophageal stricture. This is indeterminate in etiology and could reflect a benign stricture from chronic reflux. Endoscopic evaluation recommended to exclude malignancy. 2. Moderate esophageal dysmotility. Electronically Signed   By: Richardean Sale M.D.   On: 09/05/2015 12:03   Dg Abd Acute W/chest  09/04/2015  CLINICAL DATA:  Coffee-ground emesis and melena beginning today. EXAM: DG ABDOMEN ACUTE W/ 1V CHEST COMPARISON:  08/24/2015 FINDINGS: There is no evidence of dilated bowel loops or free intraperitoneal air. No radiopaque calculi identified. Extensive vascular calcification noted within the abdomen pelvis. Severe lumbar spine degenerative changes also demonstrated. Heart size and mediastinal contours are within normal limits. Low lung volumes noted, however both lungs are clear. No evidence of pneumothorax or pleural effusion. IMPRESSION: Nonspecific, nonobstructive bowel gas pattern. No active cardiopulmonary disease. Electronically Signed   By:  Earle Gell M.D.   On: 09/04/2015 23:47   Dg Abd Portable 1v  08/24/2015  CLINICAL DATA:  Nausea vomiting and vertigo, 2 days duration. EXAM: PORTABLE ABDOMEN - 1 VIEW COMPARISON:  None. FINDINGS: The bowel gas pattern is normal. No radio-opaque calculi or other significant radiographic abnormality are seen. IMPRESSION: Negative. Electronically Signed   By: Andreas Newport  M.D.   On: 08/24/2015 20:11     LOS: 0 days   Oren Binet, MD  [facility] Pager:[phone]  If 7PM-7AM, please contact night-coverage [email or web address] Password [email or web address] 09/05/2015, 1:29 PM

## 2015-09-05 NOTE — NC FL2 (Signed)
[place] MEDICAID FL2 LEVEL OF CARE SCREENING TOOL     IDENTIFICATION  Patient Name: Glenda [name] Birthdate: 1933/06/01 Sex: female Admission Date (Current Location): 09/04/2015  [facility] and [place] Number:  Herbalist and Address:  [facility], [place] [address] [place], [place] [address]      Provider Number: [id number]  Attending Physician Name and Address:  Jonetta Osgood, MD  Relative Name and Phone Number:  Wray Kearns, daughter, [phone]    Current Level of Care: Hospital Recommended Level of Care: [place] Prior Approval Number:    Date Approved/Denied:   PASRR Number:    Discharge Plan: SNF    Current Diagnoses: Patient Active Problem List   Diagnosis Date Noted  . Nausea & vomiting 09/05/2015  . Acute kidney injury ([place]) 09/04/2015  . Type II diabetes mellitus with neurological manifestations, uncontrolled ([place]) 09/03/2015  . Bilateral lower extremity edema 09/03/2015  . Essential hypertension   . Diabetes mellitus type 2 in nonobese (H[facility])   . History of breast cancer   . AKI (acute kidney injury) ([place])   . Acute blood loss anemia   . Hemiparesis ([place])   . Gait disturbance, post-stroke   . Dysphagia, post-stroke   . Acute ischemic stroke/right cerebellar 08/24/2015  . Breast cancer of upper-outer quadrant of right female breast ([place]) 04/30/2015  . CVA (cerebral infarction) 07/05/2014  . Hyperlipidemia 07/05/2014  . Syncope 07/04/2014  . Mild mitral regurgitation by prior echocardiogram 12/20/2011  . Diabetes mellitus type 1, uncontrolled, insulin dependent ([place]) 02/26/2009  . OBESITY 02/26/2009  . Uncontrolled hypertension 02/26/2009  . CAD (coronary artery disease) 02/26/2009  . GASTROESOPHAGEAL REFLUX DISEASE 02/26/2009    Orientation RESPIRATION BLADDER Height & Weight     Self, Place  Normal Continent Weight:   Height:     BEHAVIORAL SYMPTOMS/MOOD NEUROLOGICAL BOWEL NUTRITION STATUS   (N/A)   Continent Diet (Please see DC summary)  AMBULATORY STATUS COMMUNICATION OF NEEDS Skin   Extensive Assist Verbally Normal                       Personal Care Assistance Level of Assistance  Bathing, Feeding, Dressing Bathing Assistance: Maximum assistance Feeding assistance: Independent Dressing Assistance: Limited assistance     Functional Limitations Info  Sight Sight Info: Impaired        SPECIAL CARE FACTORS FREQUENCY  PT (By licensed PT), OT (By licensed OT)     PT Frequency: not yet assessed OT Frequency: not yet assessed            Contractures      Additional Factors Info  Code Status, Allergies Code Status Info: Full Allergies Info: NKA           Current Medications (09/05/2015):  This is the current hospital active medication list Current Facility-Administered Medications  Medication Dose Route Frequency Provider Last Rate Last Dose  . 0.9 %  sodium chloride infusion   Intravenous Continuous Jonetta Osgood, MD 50 mL/hr at 09/05/15 1341    . antiseptic oral rinse (CPC / CETYLPYRIDINIUM CHLORIDE 0.05%) solution 7 mL  7 mL Mouth Rinse BID Rise Patience, MD   7 mL at 09/05/15 0920  . cloNIDine (CATAPRES - Dosed in mg/24 hr) patch 0.2 mg  0.2 mg Transdermal Q Thu Rise Patience, MD   0.2 mg at 09/05/15 0119  . feeding supplement (BOOST / RESOURCE BREEZE) liquid 1 Container  1 Container Oral BID  BM Shanker Kristeen Mans, MD   1 Container at 09/05/15 1400  . hydrALAZINE (APRESOLINE) injection 10 mg  10 mg Intravenous Q4H PRN Jonetta Osgood, MD   10 mg at 09/05/15 1627  . insulin aspart (novoLOG) injection 0-9 Units  0-9 Units Subcutaneous Q4H Rise Patience, MD   1 Units at 09/05/15 [id number]  . insulin glargine (LANTUS) injection 5 Units  5 Units Subcutaneous Daily Rise Patience, MD   5 Units at 09/05/15 0920  . metoprolol (LOPRESSOR) injection 5 mg  5 mg Intravenous Q6H Shanker Kristeen Mans, MD      . pantoprazole (PROTONIX) injection  40 mg  40 mg Intravenous Q12H Rise Patience, MD   40 mg at 09/05/15 0920     Discharge Medications: Please see discharge summary for a list of discharge medications.  Relevant Imaging Results:  Relevant Lab Results:   Additional Information SSN [id number]  Benard Halsted, LCSWA

## 2015-09-05 NOTE — Progress Notes (Signed)
Patient's blood pressure is 200/56 at 1400.  Lopressor was given at 1240.  Paged Dr Sloan Leiter to make him aware.  Will recheck pressure in one hour. Awaiting for further orders

## 2015-09-05 NOTE — H&P (Addendum)
History and Physical    Glenda [name] [id number] DOB: 03-03-1934 DOA: 09/04/2015  PCP: Donnie Coffin, MD  Patient coming from: Skilled nursing facility.  Chief Complaint: Nausea vomiting.  HPI: Glenda [name] is a 82 y.o. female with medical history significant of recent cerebellar stroke, hypertension, diabetes mellitus, breast cancer was brought to the ER after patient had persistent nausea vomiting. As per patient's daughter patient had similar presentation last time and patient had acute stroke but improved at discharge. Over the last 2 days patient has been having some dizziness with persistent nausea and vomiting. Denies abdominal pain or diarrhea. Patient also noticed to have some coffee-ground emesis. Stool for blood has been negative. ER physician and discuss with on-call gastroenterologist Dr. [name] Coop would be seeing patient in consult. Patient also has been some dysphagia over the last 2 days and found it difficult to swallow her pills. On exam patient appears nonfocal abdomen appears benign patient denies any chest pain. Acute abdominal series is unremarkable.   ED Course: As as per history of present illness.  Review of Systems: As per HPI otherwise 10 point review of systems negative.    Past Medical History  Diagnosis Date  . GERD (gastroesophageal reflux disease)   . Obesity   . DM (diabetes mellitus) ([place])   . HTN (hypertension)   . HLD (hyperlipidemia)   . CAD (coronary artery disease)   . UTI (urinary tract infection)   . Chest pain   . Dizziness   . Breast cancer ([place])   . Arthritis   . Stroke ([place])   . Shortness of breath dyspnea     Past Surgical History  Procedure Laterality Date  . Cholecystectomy    . Colon polyps; hx    . Coronary angioplasty  02/14/09  . Radioactive seed guided mastectomy with axillary sentinel lymph node biopsy Right 07/17/2015    Procedure: RADIOACTIVE SEED GUIDED PARTIAL MASTECTOMY WITH AXILLARY SENTINEL LYMPH NODE BIOPSY;   Surgeon: Erroll Luna, MD;  Location: [facility];  Service: General;  Laterality: Right;     reports that she has never smoked. She has never used smokeless tobacco. She reports that she does not drink alcohol or use illicit drugs.  No Known Allergies  Family History  Problem Relation Age of Onset  . Diabetes Mother   . Colon cancer Father     also had lung  . Diabetes    . Heart attack    . Diabetes Sister     [name]  . Breast cancer Sister     Prior to Admission medications   Medication Sig Start Date End Date Taking? Authorizing Provider  amLODipine (NORVASC) 10 MG tablet Take 10 mg by mouth daily.   Yes Historical Provider, MD  aspirin 81 MG tablet Take 81 mg by mouth daily.   Yes Historical Provider, MD  clonazePAM (KLONOPIN) 0.5 MG tablet Take 0.25 mg by mouth 3 (three) times daily as needed for anxiety.   Yes Historical Provider, MD  cloNIDine (CATAPRES) 0.2 MG tablet Take 0.2 mg by mouth 2 (two) times daily.   Yes Historical Provider, MD  clopidogrel (PLAVIX) 75 MG tablet Take 75 mg by mouth daily.   Yes Historical Provider, MD  furosemide (LASIX) 80 MG tablet Take 80 mg by mouth daily.   Yes Historical Provider, MD  HUMALOG KWIKPEN 100 UNIT/ML KiwkPen Inject 22-34 Units into the skin 2 (two) times daily. Sliding scale as follows: 80-199 = 22 units 200-299 = 26 units 300-399 = 30 units >  400 = 34 units 04/30/14  Yes Historical Provider, MD  lisinopril (PRINIVIL,ZESTRIL) 20 MG tablet Take 1 tablet (20 mg total) by mouth daily. 08/28/15  Yes Bonnell Public, MD  nebivolol (BYSTOLIC) 10 MG tablet Take 20 mg by mouth daily.   Yes Historical Provider, MD  omeprazole (PRILOSEC) 20 MG capsule Take 20 mg by mouth daily.   Yes Historical Provider, MD  potassium chloride (KLOR-CON) 10 MEQ CR tablet Take 10 mEq by mouth daily.     Yes Historical Provider, MD  rosuvastatin (CRESTOR) 5 MG tablet Take 1 tablet (5 mg total) by mouth daily. 08/28/15  Yes Bonnell Public, MD  TOUJEO SOLOSTAR  300 UNIT/ML SOPN Inject 30 Units into the skin every morning.  11/22/14  Yes Historical Provider, MD  valsartan-hydrochlorothiazide (DIOVAN-HCT) 320-25 MG tablet Take 1 tablet by mouth daily.   Yes Historical Provider, MD  pantoprazole (PROTONIX) 40 MG tablet Take 40 mg by mouth 2 (two) times daily.    Historical Provider, MD    Physical Exam: Filed Vitals:   09/04/15 2130 09/04/15 2200 09/04/15 2230 09/04/15 2300  BP: 192/59 170/62 190/63 190/68  Pulse: 65 63 71 66  Temp:      TempSrc:      Resp: 15 14 18 13   SpO2: 99% 99% 100% 99%      Constitutional: Not in distress. Filed Vitals:   09/04/15 2130 09/04/15 2200 09/04/15 2230 09/04/15 2300  BP: 192/59 170/62 190/63 190/68  Pulse: 65 63 71 66  Temp:      TempSrc:      Resp: 15 14 18 13   SpO2: 99% 99% 100% 99%   Eyes: Anicteric no pallor. ENMT: No discharge from the ears eyes nose or mouth. Neck: No mass felt. No neck rigidity. No JVD appreciated. Respiratory: No rhonchi or crepitations. Cardiovascular: S1 and S2 heard. Abdomen: Soft nontender bowel sounds present. No guarding or rigidity. Musculoskeletal: No edema. Skin: No rash. Neurologic: Alert awake oriented to time place and person. Moves all extremities 5 x 5. No facial asymmetry tongue is midline. Psychiatric: Appears normal.   Labs on Admission: I have personally reviewed following labs and imaging studies  CBC:  Recent Labs Lab 09/04/15 1940  WBC 8.3  HGB 12.2  HCT 37.6  MCV 81.0  PLT 123XX123   Basic Metabolic Panel:  Recent Labs Lab 09/04/15 1940  NA 139  K 4.8  CL 101  CO2 22  GLUCOSE 256*  BUN 73*  CREATININE 2.68*  CALCIUM 10.0   GFR: Estimated Creatinine Clearance: 17.6 mL/min (by C-G formula based on Cr of 2.68). Liver Function Tests:  Recent Labs Lab 09/04/15 1940  AST 33  ALT 12*  ALKPHOS 112  BILITOT 1.3*  PROT 7.6  ALBUMIN 3.6   No results for input(s): LIPASE, AMYLASE in the last 168 hours. No results for input(s):  AMMONIA in the last 168 hours. Coagulation Profile:  Recent Labs Lab 09/04/15 2038  INR 1.02   Cardiac Enzymes: No results for input(s): CKTOTAL, CKMB, CKMBINDEX, TROPONINI in the last 168 hours. BNP (last 3 results) No results for input(s): PROBNP in the last 8760 hours. HbA1C: No results for input(s): HGBA1C in the last 72 hours. CBG: No results for input(s): GLUCAP in the last 168 hours. Lipid Profile: No results for input(s): CHOL, HDL, LDLCALC, TRIG, CHOLHDL, LDLDIRECT in the last 72 hours. Thyroid Function Tests: No results for input(s): TSH, T4TOTAL, FREET4, T3FREE, THYROIDAB in the last 72 hours. Anemia Panel: No results  for input(s): VITAMINB12, FOLATE, FERRITIN, TIBC, IRON, RETICCTPCT in the last 72 hours. Urine analysis:    Component Value Date/Time   COLORURINE YELLOW 08/24/2015 1824   APPEARANCEUR CLEAR 08/24/2015 1824   LABSPEC 1.018 08/24/2015 1824   PHURINE 5.0 08/24/2015 1824   GLUCOSEU 250* 08/24/2015 1824   HGBUR NEGATIVE 08/24/2015 1824   BILIRUBINUR NEGATIVE 08/24/2015 1824   KETONESUR NEGATIVE 08/24/2015 1824   PROTEINUR 100* 08/24/2015 1824   UROBILINOGEN 0.2 07/04/2014 1900   NITRITE NEGATIVE 08/24/2015 1824   LEUKOCYTESUR NEGATIVE 08/24/2015 1824   Sepsis Labs: @LABRCNTIP (procalcitonin:4,lacticidven:4) )No results found for this or any previous visit (from the past 240 hour(s)).   Radiological Exams on Admission: Dg Abd Acute W/chest  09/04/2015  CLINICAL DATA:  Coffee-ground emesis and melena beginning today. EXAM: DG ABDOMEN ACUTE W/ 1V CHEST COMPARISON:  08/24/2015 FINDINGS: There is no evidence of dilated bowel loops or free intraperitoneal air. No radiopaque calculi identified. Extensive vascular calcification noted within the abdomen pelvis. Severe lumbar spine degenerative changes also demonstrated. Heart size and mediastinal contours are within normal limits. Low lung volumes noted, however both lungs are clear. No evidence of pneumothorax  or pleural effusion. IMPRESSION: Nonspecific, nonobstructive bowel gas pattern. No active cardiopulmonary disease. Electronically Signed   By: Earle Gell M.D.   On: 09/04/2015 23:47    Assessment/Plan Principal Problem:   Nausea & vomiting Active Problems:   Essential hypertension   History of breast cancer   Type II diabetes mellitus with neurological manifestations, uncontrolled ([place])   Acute kidney injury ([place])    #1. Intractable nausea vomiting with dysphagia and possible GI bleed - patient had some coffee-ground hematemesis with persistent nausea and vomiting. Patient is placed nothing by mouth on IV Protonix. Gastroenterologist Dr. [name] Coop was consulted by the ER physician and will be seeing patient in consult. Closely follow CBC. Type and screen. #2. Recent cerebellar stroke - since patient had recent cerebellar stroke with similar presentation I have ordered CT head. #3. Hypertensive urgency - patient is unable to keep her pills in due to vomiting. For now I have placed patient on clonidine patch and scheduled dose of IV metoprolol when necessary IV hydralazine. Closely follow blood pressure trends. Cycle cardiac markers. #4. Diabetes mellitus type 2 - patient is on 30 units of TOUJEO. And since patient is nothing by mouth I have decreased the dose and placed on Lantus 5 units with sliding scale coverage. Closely follow CBGs. Patient's daughter also stated that patient had an hypoglycemia episode last week. #5. History of breast cancer - further workup per oncology.  Addendum - CT head shows possible extension of her previous stroke. I have discussed with Dr. Wallie Char, on-call neurologist who has recommended to get MRI brain and if it shows anything acute to reconsult neurology.  DVT prophylaxis: SCDs. Code Status: Full code.  Family Communication: Patient's daughter.  Disposition Plan: Skilled nursing facility.  Consults called: Gastroenterology.  Admission status: Inpatient.  Telemetry. Likely stay 2-3 days.    Rise Patience MD [facility] Pager [phone].  If 7PM-7AM, please contact night-coverage [email or web address] Password [facility]  09/05/2015, 12:29 AM

## 2015-09-05 NOTE — Consult Note (Signed)
Subjective:   HPI  The patient is a 82 year old female with multiple medical problems including a recent cerebellar stroke, hypertension, diabetes, and breast cancer. She also states that she has a history of acid reflux. She has been experiencing intermittent dysphagia to solid foods and pills over the past year. Over the past couple of days this has been a little worse. She had some coffee-ground emesis and was brought to the ER for further evaluation. She does take Plavix because of recent stroke. She denies feeling poorly otherwise at this time. Of note his stools were heme negative in the ER. She denies black tarry stools. She denies abdominal pain. She does have some heartburn.  Review of Systems Denies chest pain or shortness of breath  Past Medical History  Diagnosis Date  . GERD (gastroesophageal reflux disease)   . Obesity   . DM (diabetes mellitus) ([place])   . HTN (hypertension)   . HLD (hyperlipidemia)   . CAD (coronary artery disease)   . UTI (urinary tract infection)   . Chest pain   . Dizziness   . Breast cancer ([place])   . Arthritis   . Stroke ([place])   . Shortness of breath dyspnea    Past Surgical History  Procedure Laterality Date  . Cholecystectomy    . Colon polyps; hx    . Coronary angioplasty  02/14/09  . Radioactive seed guided mastectomy with axillary sentinel lymph node biopsy Right 07/17/2015    Procedure: RADIOACTIVE SEED GUIDED PARTIAL MASTECTOMY WITH AXILLARY SENTINEL LYMPH NODE BIOPSY;  Surgeon: Erroll Luna, MD;  Location: [facility];  Service: General;  Laterality: Right;   Social History   Social History  . Marital Status: Widowed    Spouse Name: N/A  . Number of Children: 5  . Years of Education: N/A   Occupational History  . Not on file.   Social History Main Topics  . Smoking status: Never Smoker   . Smokeless tobacco: Never Used  . Alcohol Use: No  . Drug Use: No  . Sexual Activity: Not Currently   Other Topics Concern  . Not on file    Social History Narrative   Retired.    family history includes Breast cancer in her sister; Colon cancer in her father; Diabetes in her mother and sister.  Current facility-administered medications:  .  0.9 %  sodium chloride infusion, , Intravenous, Continuous, Rise Patience, MD, Last Rate: 100 mL/hr at 09/05/15 0047 .  antiseptic oral rinse (CPC / CETYLPYRIDINIUM CHLORIDE 0.05%) solution 7 mL, 7 mL, Mouth Rinse, BID, Rise Patience, MD, 7 mL at 09/05/15 0147 .  cloNIDine (CATAPRES - Dosed in mg/24 hr) patch 0.2 mg, 0.2 mg, Transdermal, Q Thu, Rise Patience, MD, 0.2 mg at 09/05/15 0119 .  hydrALAZINE (APRESOLINE) injection 10 mg, 10 mg, Intravenous, Q4H PRN, Rise Patience, MD .  insulin aspart (novoLOG) injection 0-9 Units, 0-9 Units, Subcutaneous, Q4H, Rise Patience, MD, 1 Units at 09/05/15 0622 .  insulin glargine (LANTUS) injection 5 Units, 5 Units, Subcutaneous, Daily, Rise Patience, MD .  metoprolol (LOPRESSOR) injection 5 mg, 5 mg, Intravenous, Q6H, Rise Patience, MD, 5 mg at 09/05/15 [phone] .  pantoprazole (PROTONIX) injection 40 mg, 40 mg, Intravenous, Q12H, Rise Patience, MD, 40 mg at 09/05/15 0117 No Known Allergies   Objective:     BP 180/57 mmHg  Pulse 65  Temp(Src) 98.7 F (37.1 C) (Oral)  Resp 18  SpO2 99%  No distress  Nonicteric  Heart regular rhythm  Lungs clear  Abdomen: Soft and nontender  Laboratory No components found for: D1    Assessment:     Dysphagia. This has been chronic and intermittent over the past year with worsening recently.  Recent coffee-ground emesis      Plan:     At this time I would recommend PPI therapy. I will order a barium swallow to see if we might be dealing with an esophageal stricture. Her recent coffee-ground emesis may be related to esophagitis. She does not appear to be having a significant bleed. We will follow clinically. Lab Results  Component Value Date   HGB  10.3* 09/05/2015   HGB 10.6* 09/05/2015   HGB 12.2 09/04/2015   HGB 11.0* 05/07/2015   HCT 32.1* 09/05/2015   HCT 31.8* 09/05/2015   HCT 37.6 09/04/2015   HCT 34.0* 05/07/2015   ALKPHOS 88 09/05/2015   ALKPHOS 112 09/04/2015   ALKPHOS 106 08/24/2015   ALKPHOS 95 05/07/2015   AST 17 09/05/2015   AST 33 09/04/2015   AST 17 08/24/2015   AST 15 05/07/2015   ALT 10* 09/05/2015   ALT 12* 09/04/2015   ALT 10* 08/24/2015   ALT <9 05/07/2015

## 2015-09-05 NOTE — Clinical Social Work Note (Signed)
Clinical Social Work Assessment  Patient Details  Name: Glenda [name] MRN: [id number] Date of Birth: 1933/12/26  Date of referral:  09/05/15               Reason for consult:  Facility Placement                Permission sought to share information with:  Facility Sport and exercise psychologist, Family Supports Permission granted to share information::  Yes, Verbal Permission Granted  Name::     Glenda [name]   Agency::  [employer]  Relationship::  Daughter  Contact Information:  [phone]  Housing/Transportation Living arrangements for the past 2 months:  [place], [place] of Information:  Patient, Adult Children Patient Interpreter Needed:  None Criminal Activity/Legal Involvement Pertinent to Current Situation/Hospitalization:  No - Comment as needed Significant Relationships:  Adult Children Lives with:  Adult Children Do you feel safe going back to the place where you live?  No Need for family participation in patient care:  Yes (Comment)  Care giving concerns:  CSW received consult regarding discharge planning. CSW spoke with patient and her daughter at bedside. Patient came to hospital from [facility] and would like to return there once stable. CSW to continue to follow for needs.    Social Worker assessment / plan:  Patient to discharge to [facility] once stable. Facility aware.  Employment status:  Retired Nurse, adult PT Recommendations:  Not assessed at this time [place] / Referral to community resources:  [place]  Patient/Family's Response to care:  Patient reports not wanting to stay in the hospital long and wants to get back to [place].  Patient/Family's Understanding of and Emotional Response to Diagnosis, Current Treatment, and Prognosis:  No questions or concerns reported at this time.  Emotional Assessment Appearance:  Appears stated age Attitude/Demeanor/Rapport:    (Pleasant) Affect (typically observed):  Accepting, Appropriate, Pleasant Orientation:  Oriented to Self, Oriented to Place Alcohol / Substance use:  Not Applicable Psych involvement (Current and /or in the community):  No (Comment)  Discharge Needs  Concerns to be addressed:  Care Coordination Readmission within the last 30 days:  Yes Current discharge risk:  None Barriers to Discharge:  Continued Medical Work up   [employer], [place] 09/05/2015, 5:48 PM

## 2015-09-06 DIAGNOSIS — Z853 Personal history of malignant neoplasm of breast: Secondary | ICD-10-CM

## 2015-09-06 DIAGNOSIS — Z794 Long term (current) use of insulin: Secondary | ICD-10-CM

## 2015-09-06 DIAGNOSIS — I6320 Cerebral infarction due to unspecified occlusion or stenosis of unspecified precerebral arteries: Secondary | ICD-10-CM

## 2015-09-06 DIAGNOSIS — E1165 Type 2 diabetes mellitus with hyperglycemia: Secondary | ICD-10-CM

## 2015-09-06 DIAGNOSIS — E1149 Type 2 diabetes mellitus with other diabetic neurological complication: Secondary | ICD-10-CM

## 2015-09-06 DIAGNOSIS — I10 Essential (primary) hypertension: Secondary | ICD-10-CM

## 2015-09-06 LAB — CBC
HCT: 33.1 % — ABNORMAL LOW (ref 36.0–46.0)
HEMOGLOBIN: 10.7 g/dL — AB (ref 12.0–15.0)
MCH: 26.9 pg (ref 26.0–34.0)
MCHC: 32.3 g/dL (ref 30.0–36.0)
MCV: 83.2 fL (ref 78.0–100.0)
PLATELETS: 202 10*3/uL (ref 150–400)
RBC: 3.98 MIL/uL (ref 3.87–5.11)
RDW: 13.5 % (ref 11.5–15.5)
WBC: 5.7 10*3/uL (ref 4.0–10.5)

## 2015-09-06 LAB — GLUCOSE, CAPILLARY
GLUCOSE-CAPILLARY: 138 mg/dL — AB (ref 65–99)
GLUCOSE-CAPILLARY: 214 mg/dL — AB (ref 65–99)
Glucose-Capillary: 74 mg/dL (ref 65–99)
Glucose-Capillary: 440 mg/dL — ABNORMAL HIGH (ref 65–99)
Glucose-Capillary: 441 mg/dL — ABNORMAL HIGH (ref 65–99)
Glucose-Capillary: 290 mg/dL — ABNORMAL HIGH (ref 65–99)

## 2015-09-06 LAB — BASIC METABOLIC PANEL
Anion gap: 11 (ref 5–15)
BUN: 39 mg/dL — AB (ref 6–20)
CALCIUM: 9 mg/dL (ref 8.9–10.3)
CO2: 24 mmol/L (ref 22–32)
CREATININE: 1.42 mg/dL — AB (ref 0.44–1.00)
Chloride: 111 mmol/L (ref 101–111)
GFR, EST AFRICAN AMERICAN: 40 mL/min — AB (ref >60)
GFR, EST NON AFRICAN AMERICAN: 34 mL/min — AB (ref >60)
Glucose, Bld: 97 mg/dL (ref 65–99)
Potassium: 3.4 mmol/L — ABNORMAL LOW (ref 3.5–5.1)
SODIUM: 146 mmol/L — AB (ref 135–145)

## 2015-09-06 MED ORDER — HYDRALAZINE HCL 20 MG/ML IJ SOLN
10.0000 mg | INTRAMUSCULAR | Status: DC | PRN
  Administered 2015-09-06: 10 mg via INTRAVENOUS
  Filled 2015-09-06 (×2): qty 1

## 2015-09-06 MED ORDER — AMLODIPINE BESYLATE 10 MG PO TABS
10.0000 mg | ORAL_TABLET | Freq: Every day | ORAL | Status: DC
  Administered 2015-09-06 – 2015-09-09 (×4): 10 mg via ORAL
  Filled 2015-09-06 (×4): qty 1

## 2015-09-06 MED ORDER — ASPIRIN EC 325 MG PO TBEC: 325.0000 mg | DELAYED_RELEASE_TABLET | Freq: Every day | ORAL | Status: DC

## 2015-09-06 MED ORDER — CLONAZEPAM 0.5 MG PO TABS
0.2500 mg | ORAL_TABLET | Freq: Three times a day (TID) | ORAL | Status: DC | PRN
Start: 2015-09-06 — End: 2015-09-09

## 2015-09-06 MED ORDER — NEBIVOLOL HCL 2.5 MG PO TABS
10.0000 mg | ORAL_TABLET | Freq: Every day | ORAL | Status: DC
  Administered 2015-09-06 – 2015-09-07 (×2): 10 mg via ORAL
  Filled 2015-09-06 (×2): qty 4

## 2015-09-06 MED ORDER — CLOPIDOGREL BISULFATE 75 MG PO TABS
75.0000 mg | ORAL_TABLET | Freq: Every day | ORAL | Status: DC
Start: 2015-09-06 — End: 2015-09-06

## 2015-09-06 MED ORDER — DEXTROSE-NACL 5-0.9 % IV SOLN
INTRAVENOUS | Status: DC
  Administered 2015-09-06: 05:00:00 via INTRAVENOUS

## 2015-09-06 NOTE — Progress Notes (Signed)
Speech Language Pathology Treatment:    Patient Details Name: Glenda [name] MRN: [id number] DOB: 1934/03/11 Today's Date: 09/06/2015 Time:  -     Orders received for swallow assessment. Pt passed RN swallow screen and is currently being followed by GI. ST has orders for speech-language-cognitive and will complete as/when able. Please contact ST if difficulty or questions arise with swallow.    Orbie Pyo [place] [phone]

## 2015-09-06 NOTE — Progress Notes (Signed)
Subjective: Progressive solid + pill > liquid dysphagia for several weeks. 20+ weight loss.  Objective: Vital signs in last 24 hours: Temp:  [97.9 F (36.6 C)-98.4 F (36.9 C)] 98.2 F (36.8 C) (05/13 0508) Pulse Rate:  [61-72] 64 (05/13 0508) Resp:  [18-20] 18 (05/13 0508) BP: (161-206)/(36-63) 161/63 mmHg (05/13 0508) SpO2:  [100 %] 100 % (05/13 0508) Weight:  [65 kg (143 lb 4.8 oz)] 65 kg (143 lb 4.8 oz) (05/13 1000) Weight change:  Last BM Date: 09/04/15  PE: GEN:  Elderly, NAD ABD:  Soft, non-tender, protuberant  Lab Results: CBC    Component Value Date/Time   WBC 5.7 09/06/2015 0629   WBC 5.3 05/07/2015 1251   RBC 3.98 09/06/2015 0629   RBC 4.05 05/07/2015 1251   HGB 10.7* 09/06/2015 0629   HGB 11.0* 05/07/2015 1251   HCT 33.1* 09/06/2015 0629   HCT 34.0* 05/07/2015 1251   PLT 202 09/06/2015 0629   PLT 207 05/07/2015 1251   MCV 83.2 09/06/2015 0629   MCV 84.0 05/07/2015 1251   MCH 26.9 09/06/2015 0629   MCH 27.2 05/07/2015 1251   MCHC 32.3 09/06/2015 0629   MCHC 32.4 05/07/2015 1251   RDW 13.5 09/06/2015 0629   RDW 12.8 05/07/2015 1251   LYMPHSABS 1.8 05/22/2015 1100   LYMPHSABS 1.7 05/07/2015 1251   MONOABS 0.4 05/22/2015 1100   MONOABS 0.5 05/07/2015 1251   EOSABS 0.1 05/22/2015 1100   EOSABS 0.0 05/07/2015 1251   BASOSABS 0.0 05/22/2015 1100   BASOSABS 0.0 05/07/2015 1251   CMP     Component Value Date/Time   NA 146* 09/06/2015 0629   NA 141 05/07/2015 1251   K 3.4* 09/06/2015 0629   K 4.3 05/07/2015 1251   CL 111 09/06/2015 0629   CO2 24 09/06/2015 0629   CO2 22 05/07/2015 1251   GLUCOSE 97 09/06/2015 0629   GLUCOSE 110 05/07/2015 1251   BUN 39* 09/06/2015 0629   BUN 22.5 05/07/2015 1251   CREATININE 1.42* 09/06/2015 0629   CREATININE 1.2* 05/07/2015 1251   CALCIUM 9.0 09/06/2015 0629   CALCIUM 9.1 05/07/2015 1251   PROT 6.0* 09/05/2015 0622   PROT 7.1 05/07/2015 1251   ALBUMIN 2.8* 09/05/2015 0622   ALBUMIN 3.3* 05/07/2015 1251   AST 17 09/05/2015 0622   AST 15 05/07/2015 1251   ALT 10* 09/05/2015 0622   ALT <9 05/07/2015 1251   ALKPHOS 88 09/05/2015 0622   ALKPHOS 95 05/07/2015 1251   BILITOT 0.6 09/05/2015 0622   BILITOT 0.30 05/07/2015 1251   GFRNONAA 34* 09/06/2015 0629   GFRAA 40* 09/06/2015 0629   Studies/Results: Barium esophagram:  Personally reviewed; high-grade distal esophageal stricture, dysmotility  Assessment:  1.  Dysphagia.  Peptic stricture (history of GERD) versus malignancy. 2.  Weight loss, likely from #1 above. 3.  Recent stroke, small extension (interval worsening) on recent MRI. 4.  Clopidigrel for #3 above, off for the past couple days.  Plan:  1.  PPI. 2.  Off clopidigrel. 3.  Clear liquid diet today, possibly advance to full liquids tomorrow. 4.  [facility] will revisit Monday.  Patient will ultimately need endoscopy with esophageal dilatation, but would like to wait a little longer off clopidigrel and would like to make sure patient doesn't have continued extension of her stroke symptoms given recent MRI findings.   Glenda [name] 09/06/2015, 11:01 AM   Pager [phone] If no answer or after 5 PM call [phone]

## 2015-09-06 NOTE — Evaluation (Signed)
Physical Therapy Evaluation Patient Details Name: Glenda [name] MRN: [id number] DOB: 04/23/1934 Today's Date: 09/06/2015   History of Present Illness  pt presents with nausea and vomiting, and was found to have an extension of her R Cerebellar Infarct and new R Lateral Thalamus Infarct.  pt with hx of DM, HTN, CAD, Breast CA, and CVA.    Clinical Impression  Pt had been at [facility] after last CVA for rehab and pt and family wish to return there to continue her rehab.  Pt at this time requires A for all OOB mobility and would benefit from return to SNF.  Will continue to follow.      Follow Up Recommendations SNF    Equipment Recommendations  None recommended by PT    Recommendations for Other Services       Precautions / Restrictions Precautions Precautions: Fall Restrictions Weight Bearing Restrictions: No      Mobility  Bed Mobility Overal bed mobility: Needs Assistance Bed Mobility: Supine to Sit;Sit to Supine     Supine to sit: Min guard Sit to supine: Min guard   General bed mobility comments: pt needs increased time, but is able to complete without A.    Transfers Overall transfer level: Needs assistance Equipment used: Rolling walker (2 wheeled) Transfers: Sit to/from Stand Sit to Stand: Mod assist         General transfer comment: pt needs cues for safe technique and sequencing.  pt with R lateral lean during each transfer.    Ambulation/Gait Ambulation/Gait assistance: Mod assist Ambulation Distance (Feet): 5 Feet (x2) Assistive device: Rolling walker (2 wheeled) Gait Pattern/deviations: Step-through pattern;Decreased stride length;Decreased weight shift to left;Scissoring;Narrow base of support     General Gait Details: pt fatigues quickly and needs to have chair brought closer.  pt with strong lean to R side and needs constant A to prevent falling to R and posteriorly.  pt indicates dizziness during mobility and overall decreased  coordination noted.    Stairs            Wheelchair Mobility    Modified Rankin (Stroke Patients Only) Modified Rankin (Stroke Patients Only) Pre-Morbid Rankin Score: Moderately severe disability Modified Rankin: Moderately severe disability     Balance Overall balance assessment: Needs assistance Sitting-balance support: Bilateral upper extremity supported;Feet supported Sitting balance-Leahy Scale: Fair   Postural control: Right lateral lean Standing balance support: Bilateral upper extremity supported;During functional activity Standing balance-Leahy Scale: Poor                               Pertinent Vitals/Pain Pain Assessment: No/denies pain    Home Living Family/patient expects to be discharged to:: Skilled nursing facility                 Additional Comments: pt was at [facility] for rehab since original CVA and plans to return there.      Prior Function Level of Independence: Independent         Comments: Prior to original CVA.     Hand Dominance   Dominant Hand: Right    Extremity/Trunk Assessment   Upper Extremity Assessment: Defer to OT evaluation           Lower Extremity Assessment: RLE deficits/detail RLE Deficits / Details: Gernalized decreased strength and coordination.       Communication   Communication: No difficulties  Cognition Arousal/Alertness: Awake/alert Behavior During Therapy: Flat affect Overall Cognitive  Status: Within Functional Limits for tasks assessed                      General Comments      Exercises        Assessment/Plan    PT Assessment Patient needs continued PT services  PT Diagnosis Difficulty walking   PT Problem List Decreased strength;Decreased activity tolerance;Decreased balance;Decreased mobility;Decreased coordination;Decreased knowledge of use of DME  PT Treatment Interventions DME instruction;Gait training;Functional mobility training;Therapeutic  activities;Therapeutic exercise;Balance training;Neuromuscular re-education;Patient/family education   PT Goals (Current goals can be found in the Care Plan section) Acute Rehab PT Goals Patient Stated Goal: Get better. PT Goal Formulation: With patient/family Time For Goal Achievement: 09/20/15 Potential to Achieve Goals: Good    Frequency Min 3X/week   Barriers to discharge        Co-evaluation               End of Session Equipment Utilized During Treatment: Gait belt Activity Tolerance: Patient limited by fatigue Patient left: in bed;with call bell/phone within reach;with bed alarm set;with family/visitor present Nurse Communication: Mobility status         Time: [id number] PT Time Calculation (min) (ACUTE ONLY): 28 min   Charges:   PT Evaluation $PT Eval Moderate Complexity: 1 Procedure PT Treatments $Gait Training: 8-22 mins   PT G CodesCatarina Hartshorn, [place] 09/06/2015, 4:14 PM

## 2015-09-06 NOTE — Progress Notes (Addendum)
PROGRESS NOTE        PATIENT DETAILS Name: Glenda [name] Age: 82 y.o. Sex: female Date of Birth: 14-Aug-1933 Admit Date: 09/04/2015 Admitting Physician Rise Patience, MD [id number] Alroy Dust, MD  Brief Narrative: Patient is a 82 y.o. female with recent right cerebellar CVA brought back to the hospital from skilled nursing facility for evaluation of nausea and vomiting. Further evaluation positive for distal esophageal stricture, and MRI brain positive for extension of her right cerebellar infarct, and a new small foci of infarct right lateral thalamus.  Subjective: No further vomiting since admission. No dysphagia with liquids.  Assessment/Plan: Principal Problem: Nausea & vomiting: Etiology not certain-not sure if right cerebellar infarct can account for these symptoms. Alternatively, could be from high-grade esophageal stricture seen on barium esophagogram. Downgraded to clear liquid diet, supportive care, gastroenterology following and planning EGD on 5/15  Active Problems: Coffee-ground emesis/UGI bleed: Doubt active GI bleeding, could have had a small mucosal tear. Seems to have resolved spontaneously. Hemoglobin appears to be stable, continue PPI, GI planning on endoscopy  Dysphagia: Patient expresses both solid and liquid dysphagia-doubt this is related to CVA-suspect mostly related to esophageal stricture. GI following, plans are for EGD 5/15  Extension of recent right cerebellar infarct, and now new acute thalamic infarct: Although could be embolic, suspect likely secondary to dehydration-causing ischemia-due to underlying small vessel disease. Antiplatelets currently on hold, resume if EGD negative for significant abnormalities. Will not repeat workup as just recently completed, will consult neurology. Note-telemetry unremarkable so far.  Acute kidney injury on chronic kidney disease stage III: AKI likely secondary to prerenal azotemia in a setting  of dehydration-creatinine improving with IV fluids.  Type 2 diabetes: CBGs currently stable-but were borderline low this morning, will just continue SSI and discontinue Lantus for now. As diet slowly advances, will slowly place back on usual regular insulin regimen.  Uncontrolled hypertension: Initially allowed to have permissive hypertension-especially in the setting of extension of cerebellar infarct-continue transdermal clonidine, resume amlodipine and the bisoprolol. Will slowly resume usual antihypertensives.   History of breast cancer: Being followed by Dr. Lindi Adie in the outpatient setting-encouraged her to keep her next appointment.  DVT Prophylaxis: SCD's  Code Status: Full code  Family Communication: Daughter at bedside  Disposition Plan: Remain inpatient-but will plan on Home health vs SNF on discharge  Antimicrobial agents: None  Procedures: None  CONSULTS:  GI and neurology  Time spent: 25- minutes-Greater than 50% of this time was spent in counseling, explanation of diagnosis, planning of further management, and coordination of care.  MEDICATIONS: Anti-infectives    None      Scheduled Meds: . antiseptic oral rinse  7 mL Mouth Rinse BID  . cloNIDine  0.2 mg Transdermal Q Thu  . feeding supplement  1 Container Oral BID BM  . insulin aspart  0-9 Units Subcutaneous Q4H  . insulin glargine  5 Units Subcutaneous Daily  . metoprolol  5 mg Intravenous Q6H  . pantoprazole (PROTONIX) IV  40 mg Intravenous Q12H   Continuous Infusions: . dextrose 5 % and 0.9% NaCl 50 mL/hr at 09/06/15 0436   PRN Meds:.hydrALAZINE   PHYSICAL EXAM: Vital signs: Filed Vitals:   09/06/15 0508 09/06/15 1000 09/06/15 1426 09/06/15 1427  BP: 161/63  198/45 170/89  Pulse: 64  59 58  Temp: 98.2 F (36.8 C)  97.3 F (36.3  C)   TempSrc: Oral     Resp: 18  18   Height:  5\' 9"  (1.753 m)    Weight:  65 kg (143 lb 4.8 oz)    SpO2: 100%  100%    Filed Weights   09/06/15 1000    Weight: 65 kg (143 lb 4.8 oz)   Body mass index is 21.15 kg/(m^2).   Gen Exam: Awake and alert with clear speech. Not in any distress  Neck: Supple, No JVD.   Chest: B/L Clear.   CVS: S1 S2 Regular, no murmurs.  Abdomen: soft, BS +, non tender, non distended.  Extremities: no edema, lower extremities warm to touch. Neurologic: Non Focal-But does have some mild coordination issues in the right upper and right lower extremity Skin: No Rash or lesions   Wounds: N/A.    LABORATORY DATA: CBC:  Recent Labs Lab 09/04/15 1940 09/05/15 0245 09/05/15 0622 09/06/15 0629  WBC 8.3 7.0 6.4 5.7  HGB 12.2 10.6* 10.3* 10.7*  HCT 37.6 31.8* 32.1* 33.1*  MCV 81.0 81.3 82.9 83.2  PLT 279 190 215 [id number]    Basic Metabolic Panel:  Recent Labs Lab 09/04/15 1940 09/05/15 0622 09/06/15 0629  NA 139 146* 146*  K 4.8 3.6 3.4*  CL 101 107 111  CO2 22 25 24   GLUCOSE 256* 132* 97  BUN 73* 60* 39*  CREATININE 2.68* 2.09* 1.42*  CALCIUM 10.0 9.2 9.0    GFR: Estimated Creatinine Clearance: 33 mL/min (by C-G formula based on Cr of 1.42).  Liver Function Tests:  Recent Labs Lab 09/04/15 1940 09/05/15 0622  AST 33 17  ALT 12* 10*  ALKPHOS 112 88  BILITOT 1.3* 0.6  PROT 7.6 6.0*  ALBUMIN 3.6 2.8*   No results for input(s): LIPASE, AMYLASE in the last 168 hours. No results for input(s): AMMONIA in the last 168 hours.  Coagulation Profile:  Recent Labs Lab 09/04/15 2038  INR 1.02    Cardiac Enzymes:  Recent Labs Lab 09/05/15 0711  TROPONINI 0.03    BNP (last 3 results) No results for input(s): PROBNP in the last 8760 hours.  HbA1C: No results for input(s): HGBA1C in the last 72 hours.  CBG:  Recent Labs Lab 09/05/15 2117 09/05/15 2348 09/06/15 0358 09/06/15 0821 09/06/15 1204  GLUCAP 322* 232* 74 138* 214*    Lipid Profile: No results for input(s): CHOL, HDL, LDLCALC, TRIG, CHOLHDL, LDLDIRECT in the last 72 hours.  Thyroid Function Tests: No results for  input(s): TSH, T4TOTAL, FREET4, T3FREE, THYROIDAB in the last 72 hours.  Anemia Panel: No results for input(s): VITAMINB12, FOLATE, FERRITIN, TIBC, IRON, RETICCTPCT in the last 72 hours.  Urine analysis:    Component Value Date/Time   COLORURINE YELLOW 08/24/2015 1824   APPEARANCEUR CLEAR 08/24/2015 1824   LABSPEC 1.018 08/24/2015 1824   PHURINE 5.0 08/24/2015 1824   GLUCOSEU 250* 08/24/2015 1824   HGBUR NEGATIVE 08/24/2015 1824   BILIRUBINUR NEGATIVE 08/24/2015 1824   KETONESUR NEGATIVE 08/24/2015 1824   PROTEINUR 100* 08/24/2015 1824   UROBILINOGEN 0.2 07/04/2014 1900   NITRITE NEGATIVE 08/24/2015 1824   LEUKOCYTESUR NEGATIVE 08/24/2015 1824    Sepsis Labs: Lactic Acid, Venous No results found for: LATICACIDVEN  MICROBIOLOGY: Recent Results (from the past 240 hour(s))  MRSA PCR Screening     Status: None   Collection Time: 09/05/15  1:44 AM  Result Value Ref Range Status   MRSA by PCR NEGATIVE NEGATIVE Final    Comment:  The GeneXpert MRSA Assay (FDA approved for NASAL specimens only), is one component of a comprehensive MRSA colonization surveillance program. It is not intended to diagnose MRSA infection nor to guide or monitor treatment for MRSA infections.     RADIOLOGY STUDIES/RESULTS: Ct Head Wo Contrast  09/05/2015  CLINICAL DATA:  Vomiting. EXAM: CT HEAD WITHOUT CONTRAST TECHNIQUE: Contiguous axial images were obtained from the base of the skull through the vertex without intravenous contrast. COMPARISON:  08/24/2015 FINDINGS: Focal hypodensity in the right middle cerebellar peduncle directly lateral to the fourth ventricle corresponds to the area of infarction observed on 08/24/2015. This may represent edema. This appears slightly larger than the area of infarction observed on 08/24/2015, and possibility of extension of the infarct is a consideration. There is no hemorrhagic transformation. There is no intracranial hemorrhage. There is no extra-axial  fluid collection. There is mild generalized atrophy. There is white matter hypodensity which is chronic and likely due to small vessel disease. Hypodensities in the left pons may represent remote lacunar infarctions. There also is remote lacunar infarction in the left internal capsule posterior limb. No bony abnormalities. The visible paranasal sinuses and orbits are unremarkable. IMPRESSION: Focal hypodensity at the right lateral aspect of the fourth ventricle, corresponding to the location of the right cerebellar peduncle infarction observed on 08/24/2015 and likely representing edema. This could represent extension of that prior infarction. No hemorrhage. No other significant interval change. There is mild generalized atrophy and chronic white matter hypodensity which likely represents small vessel ischemic disease. MRI will be the modality of choice if additional imaging is clinically warranted. Electronically Signed   By: Andreas Newport M.D.   On: 09/05/2015 00:54   Mr Jodene Nam Head Wo Contrast  08/25/2015  CLINICAL DATA:  Continued surveillance of acute posterior circulation infarct. EXAM: MRA NECK WITHOUT AND WITH CONTRAST MRA HEAD WITHOUT CONTRAST TECHNIQUE: Multiplanar and multiecho pulse sequences of the neck were obtained without and with intravenous contrast. Angiographic images of the neck were obtained using MRA technique without and with intravenous contrast.; Angiographic images of the Circle of Willis were obtained using MRA technique without intravenous contrast. CONTRAST:  8mL MULTIHANCE GADOBENATE DIMEGLUMINE 529 MG/ML IV SOLN COMPARISON:  MR brain 08/24/2015. FINDINGS: MRA NECK FINDINGS Dolichoectasia proximal vasculature. Conventional branching of the great vessels from the arch. The proximal RIGHT common carotid artery demonstrates a estimated 75% stenosis. No flow-limiting disease at the bifurcation or throughout the course of the visualized RIGHT cervical ICA. Poorly visualized proximal LEFT  common carotid artery without definite proximal narrowing. No significant atheromatous change at the bifurcation. Cervical ICA widely patent on the LEFT. Both vertebral arteries are tortuous. They are equal in size. No definite ostial lesion. There may be a 75% stenosis of the proximal RIGHT vertebral 2 cm above its origin. MRA HEAD FINDINGS Mild non stenotic irregularity of the cavernous and supraclinoid ICA segments on the RIGHT. 50% smooth stenosis of the cavernous and supraclinoid ICA segments on the LEFT. No proximal stenosis of the anterior or middle cerebral arteries. No MCA branch occlusion. No visible intracranial aneurysm. Basilar artery is widely patent. Both vertebrals contribute equally to its formation. There is no significant proximal PCA disease or cerebellar branch occlusion. IMPRESSION: 50% stenosis of the cavernous and supraclinoid ICA segments on the LEFT, non flow reducing. No significant intracranial posterior circulation disease. No extracranial carotid bifurcation or cervical ICA lesion. Proximal RIGHT common carotid artery and proximal RIGHT vertebral artery as described above. Electronically Signed   By: Jenny Reichmann  Alfonse Flavors M.D.   On: 08/25/2015 09:06   Mr Angiogram Neck W Wo Contrast  08/25/2015  CLINICAL DATA:  Continued surveillance of acute posterior circulation infarct. EXAM: MRA NECK WITHOUT AND WITH CONTRAST MRA HEAD WITHOUT CONTRAST TECHNIQUE: Multiplanar and multiecho pulse sequences of the neck were obtained without and with intravenous contrast. Angiographic images of the neck were obtained using MRA technique without and with intravenous contrast.; Angiographic images of the Circle of Willis were obtained using MRA technique without intravenous contrast. CONTRAST:  101mL MULTIHANCE GADOBENATE DIMEGLUMINE 529 MG/ML IV SOLN COMPARISON:  MR brain 08/24/2015. FINDINGS: MRA NECK FINDINGS Dolichoectasia proximal vasculature. Conventional branching of the great vessels from the arch. The  proximal RIGHT common carotid artery demonstrates a estimated 75% stenosis. No flow-limiting disease at the bifurcation or throughout the course of the visualized RIGHT cervical ICA. Poorly visualized proximal LEFT common carotid artery without definite proximal narrowing. No significant atheromatous change at the bifurcation. Cervical ICA widely patent on the LEFT. Both vertebral arteries are tortuous. They are equal in size. No definite ostial lesion. There may be a 75% stenosis of the proximal RIGHT vertebral 2 cm above its origin. MRA HEAD FINDINGS Mild non stenotic irregularity of the cavernous and supraclinoid ICA segments on the RIGHT. 50% smooth stenosis of the cavernous and supraclinoid ICA segments on the LEFT. No proximal stenosis of the anterior or middle cerebral arteries. No MCA branch occlusion. No visible intracranial aneurysm. Basilar artery is widely patent. Both vertebrals contribute equally to its formation. There is no significant proximal PCA disease or cerebellar branch occlusion. IMPRESSION: 50% stenosis of the cavernous and supraclinoid ICA segments on the LEFT, non flow reducing. No significant intracranial posterior circulation disease. No extracranial carotid bifurcation or cervical ICA lesion. Proximal RIGHT common carotid artery and proximal RIGHT vertebral artery as described above. Electronically Signed   By: Staci Righter M.D.   On: 08/25/2015 09:06   Mr Brain Wo Contrast  09/05/2015  CLINICAL DATA:  82 year old female with small right cerebellar infarct in April. Persistent nausea vomiting, recurrent dizziness. Initial encounter. EXAM: MRI HEAD WITHOUT CONTRAST TECHNIQUE: Multiplanar, multiecho pulse sequences of the brain and surrounding structures were obtained without intravenous contrast. COMPARISON:  Head CT without contrast midnight today. Brain MRI 08/24/2015 and earlier. FINDINGS: Major intracranial vascular flow voids are stable. There has been enlargement of the  restricted diffusion along the right lateral aspect of the fourth ventricle in the right cerebellar hemisphere since April. The area now encompasses 9-10 mm, versus 3 mm previously. See series 5, image 14 and compare to series 4, image 15 previously. No other brainstem or cerebellar restricted diffusion. However, there is new curvilinear restricted diffusion in the lateral right thalamus near the posterior limb of the right internal capsule. Diffusion imaging elsewhere is stable. Mild T2 and FLAIR corresponding to the acute diffusion findings with no mass effect. There does appear to be a new punctate micro hemorrhage along the caudal aspect of the right thalamic diffusion abnormality as seen on series 9, image 45. There is underlying advanced chronic small vessel disease in the brainstem and thalami I with multiple bilateral chronic lacunar infarcts. The left midbrain also is chronically affected. Aside from the acute findings the cerebellum is relatively spared. Patchy supratentorial white matter T2 and FLAIR hyperintensity is stable with no supratentorial cortical encephalomalacia. Scattered chronic micro hemorrhages are re- demonstrated in the posterior left hemisphere and brainstem. No midline shift, mass effect, evidence of mass lesion, ventriculomegaly, or other acute intracranial  hemorrhage. Cervicomedullary junction and pituitary are within normal limits. Stable visualized cervical spine. Bone marrow signal is stable and within normal limits. Stable visualized internal auditory structures. Mastoids are clear. Trace paranasal sinus mucosal thickening is stable. Orbit and scalp soft tissues are stable. IMPRESSION: 1. Mild acute extension of the lacunar type right cerebellar infarct along the lateral wall of the fourth ventricle. No associated hemorrhage or mass effect. 2. Superimposed acute lacunar infarct in the lateral right thalamus with associated microhemorrhage. No mass effect. 3. Otherwise stable  advanced chronic small vessel disease in the brainstem and thalami. Electronically Signed   By: Genevie Ann M.D.   On: 09/05/2015 11:19   Mr Brain Wo Contrast  08/24/2015  CLINICAL DATA:  Ataxia with nausea and vomiting since yesterday. Assessment for stroke. EXAM: MRI HEAD WITHOUT CONTRAST TECHNIQUE: Multiplanar, multiecho pulse sequences of the brain and surrounding structures were obtained without intravenous contrast. COMPARISON:  07/04/2014 FINDINGS: There is a 4 mm acute infarct in the medial aspect of the right middle cerebellar peduncle. No intracranial hemorrhage, mass, midline shift, or extra-axial fluid collection is identified. Small chronic infarcts are seen in the pons and thalami. A chronic infarct in the posterior limb of the internal capsule was acute on the prior MRI. There is new wallerian degeneration along the left corticospinal tract in the cerebral peduncle. There is mild cerebral atrophy which is unchanged. Foci of T2 hyperintensity throughout the cerebral white matter and pons are similar to the prior MRI and nonspecific but compatible with moderate chronic small vessel ischemic disease. Prior bilateral cataract extraction is noted. There is a trace right mastoid effusion, and there is minimal left anterior ethmoid air cell mucosal thickening. Major intracranial vascular flow voids are preserved. IMPRESSION: 1. Punctate acute infarct in the right middle cerebellar peduncle. 2. Moderate chronic small vessel ischemic changes as above. Electronically Signed   By: Logan Bores M.D.   On: 08/24/2015 13:18   Dg Esophagus  09/05/2015  CLINICAL DATA:  82 year old with recent stroke and chronic intermittent dysphagia, primarily for solids and pills. EXAM: ESOPHOGRAM/BARIUM SWALLOW TECHNIQUE: Single contrast examination was performed using  thin barium. FLUOROSCOPY TIME:  Radiation Exposure Index (as provided by the fluoroscopic device): 96.5 uGy*m2 If the device does not provide the exposure index:  Fluoroscopy Time:  1 minutes. Number of Acquired Images: 1 spot image. Remainder of study obtained with fluoro store. COMPARISON:  None. FINDINGS: Limited study was performed with the patient in a semi erect position. Patient swallowed the contrast without difficulty. No aspiration was observed. There is moderate esophageal dysmotility with a decreased primary stripping wave and diffuse tertiary contractions. There is fixed luminal narrowing of the distal esophagus extending over approximately 1.4 cm. No definite mucosal ulceration is seen in this area. Some contrast did pass into the stomach. A 13 mm barium tablet was administered. This passed into the distal esophagus, but not through the distal esophageal stricture. IMPRESSION: 1. High-grade distal esophageal stricture. This is indeterminate in etiology and could reflect a benign stricture from chronic reflux. Endoscopic evaluation recommended to exclude malignancy. 2. Moderate esophageal dysmotility. Electronically Signed   By: Richardean Sale M.D.   On: 09/05/2015 12:03   Dg Abd Acute W/chest  09/04/2015  CLINICAL DATA:  Coffee-ground emesis and melena beginning today. EXAM: DG ABDOMEN ACUTE W/ 1V CHEST COMPARISON:  08/24/2015 FINDINGS: There is no evidence of dilated bowel loops or free intraperitoneal air. No radiopaque calculi identified. Extensive vascular calcification noted within the abdomen pelvis. Severe  lumbar spine degenerative changes also demonstrated. Heart size and mediastinal contours are within normal limits. Low lung volumes noted, however both lungs are clear. No evidence of pneumothorax or pleural effusion. IMPRESSION: Nonspecific, nonobstructive bowel gas pattern. No active cardiopulmonary disease. Electronically Signed   By: Earle Gell M.D.   On: 09/04/2015 23:47   Dg Abd Portable 1v  08/24/2015  CLINICAL DATA:  Nausea vomiting and vertigo, 2 days duration. EXAM: PORTABLE ABDOMEN - 1 VIEW COMPARISON:  None. FINDINGS: The bowel gas  pattern is normal. No radio-opaque calculi or other significant radiographic abnormality are seen. IMPRESSION: Negative. Electronically Signed   By: Andreas Newport M.D.   On: 08/24/2015 20:11     LOS: 1 day   Oren Binet, MD  [facility] Pager:[phone]  If 7PM-7AM, please contact night-coverage [email or web address] Password [facility] 09/06/2015, 3:31 PM

## 2015-09-06 NOTE — Consult Note (Signed)
Referring Physician: Dr Sloan Leiter    Chief Complaint: Nausea and vomiting with coffee-ground emesis.  HPI: Glenda [name] is a 82 y.o. female recently admitted to [facility] from 08/24/2015 until 08/28/2015 for evaluation of dizziness, nausea, vomiting, and gait instability. An MRI on 08/24/2015 revealed a punctate acute infarct in the right middle cerebellar peduncle. The patient was seen in consultation by Dr. Leonel Ramsay on 08/24/2015 and subsequently followed by Dr. Leonie Man. The infarct was felt secondary to small vessel disease. The patient had been on aspirin 325 mg daily with Plavix 75 mg daily in the past; however, when seen by Dr. Leonie Man the aspirin was discontinued. She was however discharged to a nursing facility for short-term rehabilitation on 08/27/2005 on aspirin 81 mg daily with Plavix 75 mg daily.   The patient was readmitted yesterday with nausea, vomiting, and dizziness. She was noted to have coffee-ground emesis. An MRI performed 09/05/2015 showed a mild acute extension of the lacunar type right cerebellar infarct. Neurology was asked to reconsult. The patient is currently off all antiplatelet therapy secondary to recent coffee-ground emesis. At this time she has no specific complaints and states that she feels better than during her previous admission.  Date last known well: 09/05/2015 Time last known well: Unable to determine tPA Given: No. Recent stroke and recent coffee-ground emesis.   Past Medical History  Diagnosis Date  . GERD (gastroesophageal reflux disease)   . Obesity   . DM (diabetes mellitus) ([place])   . HTN (hypertension)   . HLD (hyperlipidemia)   . CAD (coronary artery disease)   . UTI (urinary tract infection)   . Chest pain   . Dizziness   . Breast cancer ([place])   . Arthritis   . Stroke ([place])   . Shortness of breath dyspnea     Past Surgical History  Procedure Laterality Date  . Cholecystectomy    . Colon polyps; hx    . Coronary  angioplasty  02/14/09  . Radioactive seed guided mastectomy with axillary sentinel lymph node biopsy Right 07/17/2015    Procedure: RADIOACTIVE SEED GUIDED PARTIAL MASTECTOMY WITH AXILLARY SENTINEL LYMPH NODE BIOPSY;  Surgeon: Erroll Luna, MD;  Location: [facility];  Service: General;  Laterality: Right;    Family History  Problem Relation Age of Onset  . Diabetes Mother   . Colon cancer Father     also had lung  . Diabetes    . Heart attack    . Diabetes Sister     [name]  . Breast cancer Sister    Social History:  reports that she has never smoked. She has never used smokeless tobacco. She reports that she does not drink alcohol or use illicit drugs.  Allergies: No Known Allergies  Medications:  Scheduled: . amLODipine  10 mg Oral Daily  . antiseptic oral rinse  7 mL Mouth Rinse BID  . cloNIDine  0.2 mg Transdermal Q Thu  . feeding supplement  1 Container Oral BID BM  . insulin aspart  0-9 Units Subcutaneous Q4H  . nebivolol  10 mg Oral Daily  . pantoprazole (PROTONIX) IV  40 mg Intravenous Q12H    ROS: History obtained from the patient  General ROS: negative for - chills, fatigue, fever, night sweats, weight gain or weight loss. Positive for dizziness and gait problems. Recent right breast surgery or malignancy. Psychological ROS: negative for - behavioral disorder, hallucinations, memory difficulties, mood swings or suicidal ideation Ophthalmic ROS: negative for - blurry vision, double vision, eye  pain or loss of vision. Positive for decreased visual acuity. ENT ROS: negative for - epistaxis, nasal discharge, oral lesions, sore throat, tinnitus or vertigo Allergy and Immunology ROS: negative for - hives or itchy/watery eyes Hematological and Lymphatic ROS: negative for - bleeding problems, bruising or swollen lymph nodes Endocrine ROS: negative for - galactorrhea, hair pattern changes, polydipsia/polyuria or temperature intolerance Respiratory ROS: negative for - cough,  hemoptysis, shortness of breath or wheezing Cardiovascular ROS: negative for - chest pain, dyspnea on exertion, edema or irregular heartbeat Gastrointestinal ROS: negative for - abdominal pain, diarrhea, or stool incontinence. Positive for nausea, vomiting, and coffee-ground emesis. Genito-Urinary ROS: negative for - dysuria, hematuria, incontinence or urinary frequency/urgency Musculoskeletal ROS: negative for - joint swelling or muscular weakness Neurological ROS: as noted in HPI Dermatological ROS: negative for rash and skin lesion changes   Physical Examination: Blood pressure 210/56, pulse 58, temperature 97.3 F (36.3 C), temperature source Oral, resp. rate 18, height 5\' 9"  (1.753 m), weight 65 kg (143 lb 4.8 oz), SpO2 100 %.  General - pleasant 82 year old female in no acute distress. Heart - Regular rate and rhythm - no murmer Lungs - Clear to auscultation Abdomen - Soft - non tender Extremities - Distal pulses weak but intact - no edema Skin - Warm and dry  Mental Status: Alert, oriented, thought content appropriate.  Speech fluent without evidence of aphasia.  Able to follow 3 step commands without difficulty. Cranial Nerves: II: Discs not visualized; Visual fields grossly normal with possibly mild visual field deficit on the right, pupils pinpoint and sluggish. III,IV, VI: ptosis not present, extra-ocular motions intact bilaterally V,VII: smile symmetric, facial light touch sensation normal bilaterally VIII: hearing normal bilaterally IX,X: gag reflex present XI: bilateral shoulder shrug XII: midline tongue extension Motor: Right : Upper extremity   5/5 with drift   Left:     Upper extremity   5/5  Lower extremity   5/5     Lower extremity   5/5 Tone and bulk:normal tone throughout; no atrophy noted Sensory: Pinprick and light touch intact throughout, bilaterally Deep Tendon Reflexes: 2+ and symmetric throughout Plantars: Right: downgoing   Left:  downgoing Cerebellar: Finger to nose with mild dysmetria in the right upper extremity. Both lower extremities and left upper extremity normal Gait: Deferred   Laboratory Studies:  Basic Metabolic Panel:  Recent Labs Lab 09/04/15 1940 09/05/15 0622 09/06/15 0629  NA 139 146* 146*  K 4.8 3.6 3.4*  CL 101 107 111  CO2 22 25 24   GLUCOSE 256* 132* 97  BUN 73* 60* 39*  CREATININE 2.68* 2.09* 1.42*  CALCIUM 10.0 9.2 9.0    Liver Function Tests:  Recent Labs Lab 09/04/15 1940 09/05/15 0622  AST 33 17  ALT 12* 10*  ALKPHOS 112 88  BILITOT 1.3* 0.6  PROT 7.6 6.0*  ALBUMIN 3.6 2.8*   No results for input(s): LIPASE, AMYLASE in the last 168 hours. No results for input(s): AMMONIA in the last 168 hours.  CBC:  Recent Labs Lab 09/04/15 1940 09/05/15 0245 09/05/15 0622 09/06/15 0629  WBC 8.3 7.0 6.4 5.7  HGB 12.2 10.6* 10.3* 10.7*  HCT 37.6 31.8* 32.1* 33.1*  MCV 81.0 81.3 82.9 83.2  PLT 279 190 215 202    Cardiac Enzymes:  Recent Labs Lab 09/05/15 0711  TROPONINI 0.03    BNP: Invalid input(s): POCBNP  CBG:  Recent Labs Lab 09/06/15 0358 09/06/15 0821 09/06/15 1204 09/06/15 1646 09/06/15 1648  GLUCAP 74 138* 214*  2* 5*    Microbiology: Results for orders placed or performed during the hospital encounter of 09/04/15  MRSA PCR Screening     Status: None   Collection Time: 09/05/15  1:44 AM  Result Value Ref Range Status   MRSA by PCR NEGATIVE NEGATIVE Final    Comment:        The GeneXpert MRSA Assay (FDA approved for NASAL specimens only), is one component of a comprehensive MRSA colonization surveillance program. It is not intended to diagnose MRSA infection nor to guide or monitor treatment for MRSA infections.     Coagulation Studies:  Recent Labs  09/04/15 2038  LABPROT 13.6  INR 1.02    Urinalysis: No results for input(s): COLORURINE, LABSPEC, PHURINE, GLUCOSEU, HGBUR, BILIRUBINUR, KETONESUR, PROTEINUR, UROBILINOGEN,  NITRITE, LEUKOCYTESUR in the last 168 hours.  Invalid input(s): APPERANCEUR  Lipid Panel:    Component Value Date/Time   CHOL 151 08/25/2015 0353   TRIG 168* 08/25/2015 0353   HDL 47 08/25/2015 0353   CHOLHDL 3.2 08/25/2015 0353   VLDL 34 08/25/2015 0353   LDLCALC 70 08/25/2015 0353    HgbA1C:  Lab Results  Component Value Date   HGBA1C 10.2* 08/25/2015    Urine Drug Screen:  No results found for: LABOPIA, COCAINSCRNUR, LABBENZ, AMPHETMU, THCU, LABBARB  Alcohol Level: No results for input(s): ETH in the last 168 hours.  Other results: EKG: Sinus rhythm rate 79 bpm. Please refer to the formal cardiology reading for complete details.  Imaging:  Ct Head Wo Contrast 09/05/2015   Focal hypodensity at the right lateral aspect of the fourth ventricle, corresponding to the location of the right cerebellar peduncle infarction observed on 08/24/2015 and likely representing edema. This could represent extension of that prior infarction. No hemorrhage. No other significant interval change. There is mild generalized atrophy and chronic white matter hypodensity which likely represents small vessel ischemic disease. MRI will be the modality of choice if additional imaging is clinically warranted.   Mr Brain Wo Contrast 09/05/2015   1. Mild acute extension of the lacunar type right cerebellar infarct along the lateral wall of the fourth ventricle. No associated hemorrhage or mass effect.  2. Superimposed acute lacunar infarct in the lateral right thalamus with associated microhemorrhage. No mass effect.  3. Otherwise stable advanced chronic small vessel disease in the brainstem and thalami.   Dg Esophagus 09/05/2015   1. High-grade distal esophageal stricture. This is indeterminate in etiology and could reflect a benign stricture from chronic reflux. Endoscopic evaluation recommended to exclude malignancy.  2. Moderate esophageal dysmotility.    Dg Abd Acute W/chest 09/04/2015    Nonspecific, nonobstructive bowel gas pattern. No active cardiopulmonary disease.   MRI head without contrast 08/24/2015 1. Punctate acute infarct in the right middle cerebellar peduncle. 2. Moderate chronic small vessel ischemic changes as above.  MRA Head and Neck 08/25/2015 50% stenosis of the cavernous and supraclinoid ICA segments on the LEFT, non flow reducing. No significant intracranial posterior circulation disease. No extracranial carotid bifurcation or cervical ICA lesion. Proximal RIGHT common carotid artery and proximal RIGHT vertebral artery as described above.      Assessment: 82 y.o. female with history of diabetes mellitus, hypertension, hyperlipidemia, coronary artery disease, breast cancer with recent surgery, recent stroke, and obesity admitted for further evaluation of nausea and vomiting with coffee-ground emesis. MRI of the head was consistent with a mild acute extension of the lacunar type right cerebellar infarct. Unfortunately the patient is now off all antiplatelet therapy  due to the recent coffee-ground emesis.  Stroke Risk Factors - diabetes mellitus, hypertension, hyperlipidemia, coronary artery disease, previous stroke, and obesity  Plan:  Plan to follow per attending neurologist.   Mikey Bussing PA-C [facility] Pager [phone] 09/06/2015, 6:29 PM   82 yo F with recurrent posterior circulation strokes.  Shewas on  Dual antiplatelet therapy and hadrefractory n/v. There was coffee ground emesis, butappears any bleeding has stopped.   I would favor dual antiplatelets given her recurrent symptoms, but given that she is about to have a procedure which precludes plavix and had recent concern for GI bleed, monotherapy is reasonable. I discussed with Dr. Paulita Fujita of GI and heindicated that he would have no objection to ASA.  1) started ASA 2) PT,OT  2) Stroke team to follow.   Roland Rack, MD [facility] [phone]  If 7pm- 7am, please page neurology on call as listed in [place].

## 2015-09-07 DIAGNOSIS — I639 Cerebral infarction, unspecified: Secondary | ICD-10-CM

## 2015-09-07 DIAGNOSIS — I6381 Other cerebral infarction due to occlusion or stenosis of small artery: Secondary | ICD-10-CM | POA: Insufficient documentation

## 2015-09-07 DIAGNOSIS — I6330 Cerebral infarction due to thrombosis of unspecified cerebral artery: Secondary | ICD-10-CM | POA: Insufficient documentation

## 2015-09-07 LAB — GLUCOSE, CAPILLARY
GLUCOSE-CAPILLARY: 152 mg/dL — AB (ref 65–99)
GLUCOSE-CAPILLARY: 161 mg/dL — AB (ref 65–99)
GLUCOSE-CAPILLARY: 243 mg/dL — AB (ref 65–99)
Glucose-Capillary: 139 mg/dL — ABNORMAL HIGH (ref 65–99)
Glucose-Capillary: 197 mg/dL — ABNORMAL HIGH (ref 65–99)
Glucose-Capillary: 216 mg/dL — ABNORMAL HIGH (ref 65–99)
Glucose-Capillary: 90 mg/dL (ref 65–99)

## 2015-09-07 LAB — TSH: TSH: 0.991 u[IU]/mL (ref 0.350–4.500)

## 2015-09-07 MED ORDER — POTASSIUM CHLORIDE CRYS ER 20 MEQ PO TBCR
40.0000 meq | EXTENDED_RELEASE_TABLET | Freq: Once | ORAL | Status: AC
  Administered 2015-09-07: 40 meq via ORAL
  Filled 2015-09-07: qty 2

## 2015-09-07 MED ORDER — ASPIRIN 81 MG PO CHEW
81.0000 mg | CHEWABLE_TABLET | Freq: Every day | ORAL | Status: DC
  Administered 2015-09-07 – 2015-09-09 (×3): 81 mg via ORAL
  Filled 2015-09-07 (×3): qty 1

## 2015-09-07 MED ORDER — HYDRALAZINE HCL 50 MG PO TABS
50.0000 mg | ORAL_TABLET | Freq: Three times a day (TID) | ORAL | Status: DC
  Administered 2015-09-07 – 2015-09-09 (×7): 50 mg via ORAL
  Filled 2015-09-07 (×7): qty 1

## 2015-09-07 NOTE — Progress Notes (Signed)
PROGRESS NOTE        PATIENT DETAILS Name: Glenda [name] Age: 82 y.o. Sex: female Date of Birth: Apr 14, 1934 Admit Date: 09/04/2015 Admitting Physician Rise Patience, MD [id number] Alroy Dust, MD  Brief Narrative: Patient is a 82 y.o. female with recent right cerebellar CVA brought back to the hospital from skilled nursing facility for evaluation of nausea and vomiting. Further evaluation positive for distal esophageal stricture, and MRI brain positive for extension of her right cerebellar infarct, and a new small foci of infarct right lateral thalamus.  Subjective:  In bed, denies any headache, no new weakness, no chest abdominal pain, mild nausea but no emesis.  Assessment/Plan:  Nausea & vomiting: Etiology not certain-not sure if right cerebellar infarct can account for these symptoms. Alternatively, could be from high-grade esophageal stricture seen on barium esophagogram. Downgraded to clear liquid diet, supportive care, gastroenterology following and planning EGD on 5/15  Coffee-ground emesis/UGI bleed: Doubt active GI bleeding, could have had a small mucosal tear. Seems to have resolved spontaneously. Hemoglobin appears to be stable, continue PPI, GI planning on endoscopy on 09/08/2015.  Dysphagia: Patient expresses both solid and liquid dysphagia-doubt this is related to CVA-suspect mostly related to esophageal stricture. GI following, plans are for EGD 5/15  Extension of recent right cerebellar infarct, and now new acute thalamic infarct: Although could be embolic, suspect likely secondary to dehydration-causing ischemia-due to underlying small vessel disease. Currently placed on aspirin after discussions with GI physician Dr. Paulita Fujita, resume if EGD negative for significant abnormalities. Will not repeat workup as just recently completed, will consult neurology.    Acute kidney injury on chronic kidney disease stage III: AKI likely secondary to  prerenal azotemia in a setting of dehydration-creatinine improving with IV fluids. Baseline 1.3  Type 2 diabetes: CBGs currently stable-but were borderline low this morning, will just continue SSI and discontinue Lantus for now. As diet slowly advances, will slowly place back on usual regular insulin regimen.  Uncontrolled hypertension: Due to junctional rhythm discontinued beta blocker and Catapres, currently on Norvasc along with hydralazine. We'll monitor.  History of breast cancer: Being followed by Dr. Lindi Adie in the outpatient setting-encouraged her to keep her next appointment.  One episode of junctional rhythm on telemetry. Asymptomatic, discontinued Catapres patch & B blcoker, discussed with cardiologist on-call doctor Dr. Aundra Dubin, no further workup. TSH stable. Will monitor. EKG unremarkable.  Lab Results  Component Value Date   TSH 0.991 09/07/2015     DVT Prophylaxis: SCD's  Code Status: Full code  Family Communication: Daughter at bedside  Disposition Plan: Remain inpatient-but will plan on Home health vs SNF on discharge  Antimicrobial agents: None  Procedures:  EGD due  CONSULTS:  GI and neurology  Time spent: 25- minutes-Greater than 50% of this time was spent in counseling, explanation of diagnosis, planning of further management, and coordination of care.  MEDICATIONS: Anti-infectives    None      Scheduled Meds: . amLODipine  10 mg Oral Daily  . antiseptic oral rinse  7 mL Mouth Rinse BID  . aspirin  81 mg Oral Daily  . feeding supplement  1 Container Oral BID BM  . hydrALAZINE  50 mg Oral Q8H  . insulin aspart  0-9 Units Subcutaneous Q4H  . nebivolol  10 mg Oral Daily  . pantoprazole (PROTONIX) IV  40 mg Intravenous Q12H  Continuous Infusions:   PRN Meds:.clonazePAM, hydrALAZINE   PHYSICAL EXAM: Vital signs: Filed Vitals:   09/06/15 1719 09/06/15 2152 09/07/15 0514 09/07/15 0544  BP: 210/56 171/56 131/30 159/51  Pulse:  65 61    Temp:  98.9 F (37.2 C) 98.2 F (36.8 C)   TempSrc:   Oral   Resp:  17 18   Height:      Weight:      SpO2:  100% 97%    Filed Weights   09/06/15 1000  Weight: 65 kg (143 lb 4.8 oz)   Body mass index is 21.15 kg/(m^2).   Gen Exam: Awake and alert with clear speech. Not in any distress  Neck: Supple, No JVD.   Chest: B/L Clear.   CVS: S1 S2 Regular, no murmurs.  Abdomen: soft, BS +, non tender, non distended.  Extremities: no edema, lower extremities warm to touch. Neurologic: Non Focal-But does have some mild coordination issues in the right upper and right lower extremity, mild R facial droop Skin: No Rash or lesions   Wounds: N/A.    LABORATORY DATA: CBC:  Recent Labs Lab 09/04/15 1940 09/05/15 0245 09/05/15 0622 09/06/15 0629  WBC 8.3 7.0 6.4 5.7  HGB 12.2 10.6* 10.3* 10.7*  HCT 37.6 31.8* 32.1* 33.1*  MCV 81.0 81.3 82.9 83.2  PLT 279 190 215 [id number]    Basic Metabolic Panel:  Recent Labs Lab 09/04/15 1940 09/05/15 0622 09/06/15 0629  NA 139 146* 146*  K 4.8 3.6 3.4*  CL 101 107 111  CO2 22 25 24   GLUCOSE 256* 132* 97  BUN 73* 60* 39*  CREATININE 2.68* 2.09* 1.42*  CALCIUM 10.0 9.2 9.0    GFR: Estimated Creatinine Clearance: 33 mL/min (by C-G formula based on Cr of 1.42).  Liver Function Tests:  Recent Labs Lab 09/04/15 1940 09/05/15 0622  AST 33 17  ALT 12* 10*  ALKPHOS 112 88  BILITOT 1.3* 0.6  PROT 7.6 6.0*  ALBUMIN 3.6 2.8*   No results for input(s): LIPASE, AMYLASE in the last 168 hours. No results for input(s): AMMONIA in the last 168 hours.  Coagulation Profile:  Recent Labs Lab 09/04/15 2038  INR 1.02    Cardiac Enzymes:  Recent Labs Lab 09/05/15 0711  TROPONINI 0.03    BNP (last 3 results) No results for input(s): PROBNP in the last 8760 hours.  HbA1C: No results for input(s): HGBA1C in the last 72 hours.  CBG:  Recent Labs Lab 09/06/15 2008 09/07/15 09/07/15 0430 09/07/15 0509 09/07/15 0819  GLUCAP  290* 90 152* 139* 161*    Lipid Profile: No results for input(s): CHOL, HDL, LDLCALC, TRIG, CHOLHDL, LDLDIRECT in the last 72 hours.  Thyroid Function Tests:  Recent Labs  09/07/15 0733  TSH 0.991    Anemia Panel: No results for input(s): VITAMINB12, FOLATE, FERRITIN, TIBC, IRON, RETICCTPCT in the last 72 hours.  Urine analysis:    Component Value Date/Time   COLORURINE YELLOW 08/24/2015 1824   APPEARANCEUR CLEAR 08/24/2015 1824   LABSPEC 1.018 08/24/2015 1824   PHURINE 5.0 08/24/2015 1824   GLUCOSEU 250* 08/24/2015 1824   HGBUR NEGATIVE 08/24/2015 1824   BILIRUBINUR NEGATIVE 08/24/2015 1824   KETONESUR NEGATIVE 08/24/2015 1824   PROTEINUR 100* 08/24/2015 1824   UROBILINOGEN 0.2 07/04/2014 1900   NITRITE NEGATIVE 08/24/2015 1824   LEUKOCYTESUR NEGATIVE 08/24/2015 1824    Sepsis Labs: Lactic Acid, Venous No results found for: LATICACIDVEN  MICROBIOLOGY: Recent Results (from the past 240 hour(s))  MRSA  PCR Screening     Status: None   Collection Time: 09/05/15  1:44 AM  Result Value Ref Range Status   MRSA by PCR NEGATIVE NEGATIVE Final    Comment:        The GeneXpert MRSA Assay (FDA approved for NASAL specimens only), is one component of a comprehensive MRSA colonization surveillance program. It is not intended to diagnose MRSA infection nor to guide or monitor treatment for MRSA infections.     RADIOLOGY STUDIES/RESULTS: Ct Head Wo Contrast  09/05/2015  CLINICAL DATA:  Vomiting. EXAM: CT HEAD WITHOUT CONTRAST TECHNIQUE: Contiguous axial images were obtained from the base of the skull through the vertex without intravenous contrast. COMPARISON:  08/24/2015 FINDINGS: Focal hypodensity in the right middle cerebellar peduncle directly lateral to the fourth ventricle corresponds to the area of infarction observed on 08/24/2015. This may represent edema. This appears slightly larger than the area of infarction observed on 08/24/2015, and possibility of extension  of the infarct is a consideration. There is no hemorrhagic transformation. There is no intracranial hemorrhage. There is no extra-axial fluid collection. There is mild generalized atrophy. There is white matter hypodensity which is chronic and likely due to small vessel disease. Hypodensities in the left pons may represent remote lacunar infarctions. There also is remote lacunar infarction in the left internal capsule posterior limb. No bony abnormalities. The visible paranasal sinuses and orbits are unremarkable. IMPRESSION: Focal hypodensity at the right lateral aspect of the fourth ventricle, corresponding to the location of the right cerebellar peduncle infarction observed on 08/24/2015 and likely representing edema. This could represent extension of that prior infarction. No hemorrhage. No other significant interval change. There is mild generalized atrophy and chronic white matter hypodensity which likely represents small vessel ischemic disease. MRI will be the modality of choice if additional imaging is clinically warranted. Electronically Signed   By: Andreas Newport M.D.   On: 09/05/2015 00:54   Mr Jodene Nam Head Wo Contrast  08/25/2015  CLINICAL DATA:  Continued surveillance of acute posterior circulation infarct. EXAM: MRA NECK WITHOUT AND WITH CONTRAST MRA HEAD WITHOUT CONTRAST TECHNIQUE: Multiplanar and multiecho pulse sequences of the neck were obtained without and with intravenous contrast. Angiographic images of the neck were obtained using MRA technique without and with intravenous contrast.; Angiographic images of the Circle of Willis were obtained using MRA technique without intravenous contrast. CONTRAST:  2mL MULTIHANCE GADOBENATE DIMEGLUMINE 529 MG/ML IV SOLN COMPARISON:  MR brain 08/24/2015. FINDINGS: MRA NECK FINDINGS Dolichoectasia proximal vasculature. Conventional branching of the great vessels from the arch. The proximal RIGHT common carotid artery demonstrates a estimated 75% stenosis. No  flow-limiting disease at the bifurcation or throughout the course of the visualized RIGHT cervical ICA. Poorly visualized proximal LEFT common carotid artery without definite proximal narrowing. No significant atheromatous change at the bifurcation. Cervical ICA widely patent on the LEFT. Both vertebral arteries are tortuous. They are equal in size. No definite ostial lesion. There may be a 75% stenosis of the proximal RIGHT vertebral 2 cm above its origin. MRA HEAD FINDINGS Mild non stenotic irregularity of the cavernous and supraclinoid ICA segments on the RIGHT. 50% smooth stenosis of the cavernous and supraclinoid ICA segments on the LEFT. No proximal stenosis of the anterior or middle cerebral arteries. No MCA branch occlusion. No visible intracranial aneurysm. Basilar artery is widely patent. Both vertebrals contribute equally to its formation. There is no significant proximal PCA disease or cerebellar branch occlusion. IMPRESSION: 50% stenosis of the cavernous and supraclinoid  ICA segments on the LEFT, non flow reducing. No significant intracranial posterior circulation disease. No extracranial carotid bifurcation or cervical ICA lesion. Proximal RIGHT common carotid artery and proximal RIGHT vertebral artery as described above. Electronically Signed   By: Staci Righter M.D.   On: 08/25/2015 09:06   Mr Angiogram Neck W Wo Contrast  08/25/2015  CLINICAL DATA:  Continued surveillance of acute posterior circulation infarct. EXAM: MRA NECK WITHOUT AND WITH CONTRAST MRA HEAD WITHOUT CONTRAST TECHNIQUE: Multiplanar and multiecho pulse sequences of the neck were obtained without and with intravenous contrast. Angiographic images of the neck were obtained using MRA technique without and with intravenous contrast.; Angiographic images of the Circle of Willis were obtained using MRA technique without intravenous contrast. CONTRAST:  3mL MULTIHANCE GADOBENATE DIMEGLUMINE 529 MG/ML IV SOLN COMPARISON:  MR brain  08/24/2015. FINDINGS: MRA NECK FINDINGS Dolichoectasia proximal vasculature. Conventional branching of the great vessels from the arch. The proximal RIGHT common carotid artery demonstrates a estimated 75% stenosis. No flow-limiting disease at the bifurcation or throughout the course of the visualized RIGHT cervical ICA. Poorly visualized proximal LEFT common carotid artery without definite proximal narrowing. No significant atheromatous change at the bifurcation. Cervical ICA widely patent on the LEFT. Both vertebral arteries are tortuous. They are equal in size. No definite ostial lesion. There may be a 75% stenosis of the proximal RIGHT vertebral 2 cm above its origin. MRA HEAD FINDINGS Mild non stenotic irregularity of the cavernous and supraclinoid ICA segments on the RIGHT. 50% smooth stenosis of the cavernous and supraclinoid ICA segments on the LEFT. No proximal stenosis of the anterior or middle cerebral arteries. No MCA branch occlusion. No visible intracranial aneurysm. Basilar artery is widely patent. Both vertebrals contribute equally to its formation. There is no significant proximal PCA disease or cerebellar branch occlusion. IMPRESSION: 50% stenosis of the cavernous and supraclinoid ICA segments on the LEFT, non flow reducing. No significant intracranial posterior circulation disease. No extracranial carotid bifurcation or cervical ICA lesion. Proximal RIGHT common carotid artery and proximal RIGHT vertebral artery as described above. Electronically Signed   By: Staci Righter M.D.   On: 08/25/2015 09:06   Mr Brain Wo Contrast  09/05/2015  CLINICAL DATA:  82 year old female with small right cerebellar infarct in April. Persistent nausea vomiting, recurrent dizziness. Initial encounter. EXAM: MRI HEAD WITHOUT CONTRAST TECHNIQUE: Multiplanar, multiecho pulse sequences of the brain and surrounding structures were obtained without intravenous contrast. COMPARISON:  Head CT without contrast midnight  today. Brain MRI 08/24/2015 and earlier. FINDINGS: Major intracranial vascular flow voids are stable. There has been enlargement of the restricted diffusion along the right lateral aspect of the fourth ventricle in the right cerebellar hemisphere since April. The area now encompasses 9-10 mm, versus 3 mm previously. See series 5, image 14 and compare to series 4, image 15 previously. No other brainstem or cerebellar restricted diffusion. However, there is new curvilinear restricted diffusion in the lateral right thalamus near the posterior limb of the right internal capsule. Diffusion imaging elsewhere is stable. Mild T2 and FLAIR corresponding to the acute diffusion findings with no mass effect. There does appear to be a new punctate micro hemorrhage along the caudal aspect of the right thalamic diffusion abnormality as seen on series 9, image 45. There is underlying advanced chronic small vessel disease in the brainstem and thalami I with multiple bilateral chronic lacunar infarcts. The left midbrain also is chronically affected. Aside from the acute findings the cerebellum is relatively spared. Patchy supratentorial  white matter T2 and FLAIR hyperintensity is stable with no supratentorial cortical encephalomalacia. Scattered chronic micro hemorrhages are re- demonstrated in the posterior left hemisphere and brainstem. No midline shift, mass effect, evidence of mass lesion, ventriculomegaly, or other acute intracranial hemorrhage. Cervicomedullary junction and pituitary are within normal limits. Stable visualized cervical spine. Bone marrow signal is stable and within normal limits. Stable visualized internal auditory structures. Mastoids are clear. Trace paranasal sinus mucosal thickening is stable. Orbit and scalp soft tissues are stable. IMPRESSION: 1. Mild acute extension of the lacunar type right cerebellar infarct along the lateral wall of the fourth ventricle. No associated hemorrhage or mass effect. 2.  Superimposed acute lacunar infarct in the lateral right thalamus with associated microhemorrhage. No mass effect. 3. Otherwise stable advanced chronic small vessel disease in the brainstem and thalami. Electronically Signed   By: Genevie Ann M.D.   On: 09/05/2015 11:19   Mr Brain Wo Contrast  08/24/2015  CLINICAL DATA:  Ataxia with nausea and vomiting since yesterday. Assessment for stroke. EXAM: MRI HEAD WITHOUT CONTRAST TECHNIQUE: Multiplanar, multiecho pulse sequences of the brain and surrounding structures were obtained without intravenous contrast. COMPARISON:  07/04/2014 FINDINGS: There is a 4 mm acute infarct in the medial aspect of the right middle cerebellar peduncle. No intracranial hemorrhage, mass, midline shift, or extra-axial fluid collection is identified. Small chronic infarcts are seen in the pons and thalami. A chronic infarct in the posterior limb of the internal capsule was acute on the prior MRI. There is new wallerian degeneration along the left corticospinal tract in the cerebral peduncle. There is mild cerebral atrophy which is unchanged. Foci of T2 hyperintensity throughout the cerebral white matter and pons are similar to the prior MRI and nonspecific but compatible with moderate chronic small vessel ischemic disease. Prior bilateral cataract extraction is noted. There is a trace right mastoid effusion, and there is minimal left anterior ethmoid air cell mucosal thickening. Major intracranial vascular flow voids are preserved. IMPRESSION: 1. Punctate acute infarct in the right middle cerebellar peduncle. 2. Moderate chronic small vessel ischemic changes as above. Electronically Signed   By: Logan Bores M.D.   On: 08/24/2015 13:18   Dg Esophagus  09/05/2015  CLINICAL DATA:  82 year old with recent stroke and chronic intermittent dysphagia, primarily for solids and pills. EXAM: ESOPHOGRAM/BARIUM SWALLOW TECHNIQUE: Single contrast examination was performed using  thin barium. FLUOROSCOPY  TIME:  Radiation Exposure Index (as provided by the fluoroscopic device): 96.5 uGy*m2 If the device does not provide the exposure index: Fluoroscopy Time:  1 minutes. Number of Acquired Images: 1 spot image. Remainder of study obtained with fluoro store. COMPARISON:  None. FINDINGS: Limited study was performed with the patient in a semi erect position. Patient swallowed the contrast without difficulty. No aspiration was observed. There is moderate esophageal dysmotility with a decreased primary stripping wave and diffuse tertiary contractions. There is fixed luminal narrowing of the distal esophagus extending over approximately 1.4 cm. No definite mucosal ulceration is seen in this area. Some contrast did pass into the stomach. A 13 mm barium tablet was administered. This passed into the distal esophagus, but not through the distal esophageal stricture. IMPRESSION: 1. High-grade distal esophageal stricture. This is indeterminate in etiology and could reflect a benign stricture from chronic reflux. Endoscopic evaluation recommended to exclude malignancy. 2. Moderate esophageal dysmotility. Electronically Signed   By: Richardean Sale M.D.   On: 09/05/2015 12:03   Dg Abd Acute W/chest  09/04/2015  CLINICAL DATA:  Coffee-ground  emesis and melena beginning today. EXAM: DG ABDOMEN ACUTE W/ 1V CHEST COMPARISON:  08/24/2015 FINDINGS: There is no evidence of dilated bowel loops or free intraperitoneal air. No radiopaque calculi identified. Extensive vascular calcification noted within the abdomen pelvis. Severe lumbar spine degenerative changes also demonstrated. Heart size and mediastinal contours are within normal limits. Low lung volumes noted, however both lungs are clear. No evidence of pneumothorax or pleural effusion. IMPRESSION: Nonspecific, nonobstructive bowel gas pattern. No active cardiopulmonary disease. Electronically Signed   By: Earle Gell M.D.   On: 09/04/2015 23:47   Dg Abd Portable 1v  08/24/2015   CLINICAL DATA:  Nausea vomiting and vertigo, 2 days duration. EXAM: PORTABLE ABDOMEN - 1 VIEW COMPARISON:  None. FINDINGS: The bowel gas pattern is normal. No radio-opaque calculi or other significant radiographic abnormality are seen. IMPRESSION: Negative. Electronically Signed   By: Andreas Newport M.D.   On: 08/24/2015 20:11     LOS: 2 days   Thurnell Lose, MD  [facility] Pager:[phone]  If 7PM-7AM, please contact night-coverage [email or web address] Password [email or web address] 09/07/2015, 11:05 AM

## 2015-09-07 NOTE — Progress Notes (Addendum)
Notified Dr. Candiss Norse that telemetry states pt may be in junctional rhythm. MD gave order for stat EKG and stat TSH. MD stated to call results of EKG. Notified Katie, dayshift RN of this information.Will continue to monitor pt. Glenda [name]

## 2015-09-07 NOTE — Progress Notes (Signed)
Tentative plan for endoscopy +/- esophageal dilatation tomorrow morning with Dr. Watt Climes.  On aspirin, and off clopidigrel.

## 2015-09-07 NOTE — Progress Notes (Signed)
STROKE TEAM PROGRESS NOTE   HISTORY OF PRESENT ILLNESS Glenda [name] is a 82 y.o. female recently admitted to [facility] from 08/24/2015 until 08/28/2015 for evaluation of dizziness, nausea, vomiting, and gait instability. An MRI on 08/24/2015 revealed a punctate acute infarct in the right middle cerebellar peduncle. The patient was seen in consultation by Dr. Leonel Ramsay on 08/24/2015 and subsequently followed by Dr. Leonie Man. The infarct was felt secondary to small vessel disease. The patient had been on aspirin 325 mg daily with Plavix 75 mg daily in the past; however, when seen by Dr. Leonie Man the aspirin was discontinued. She was however discharged to a nursing facility for short-term rehabilitation on 08/27/2005 on aspirin 81 mg daily with Plavix 75 mg daily.   The patient was readmitted yesterday with nausea, vomiting, and dizziness. She was noted to have coffee-ground emesis. An MRI performed 09/05/2015 showed a mild acute extension of the lacunar type right cerebellar infarct. Neurology was asked to reconsult. The patient is currently off all antiplatelet therapy secondary to recent coffee-ground emesis. At this time she has no specific complaints and states that she feels better than during her previous admission.  Date last known well: 09/05/2015 Time last known well: Unable to determine tPA Given: No. Recent stroke and recent coffee-ground emesis.   SUBJECTIVE (INTERVAL HISTORY) Family at bedside, symptoms resolved.   OBJECTIVE Temp:  [97.3 F (36.3 C)-98.9 F (37.2 C)] 98.2 F (36.8 C) (05/14 0514) Pulse Rate:  [58-65] 61 (05/14 0514) Cardiac Rhythm:  [-] Normal sinus rhythm (05/13 1900) Resp:  [17-18] 18 (05/14 0514) BP: (131-210)/(30-89) 159/51 mmHg (05/14 0544) SpO2:  [97 %-100 %] 97 % (05/14 0514) Weight:  [65 kg (143 lb 4.8 oz)] 65 kg (143 lb 4.8 oz) (05/13 1000)  CBC:  Recent Labs Lab 09/05/15 0622 09/06/15 0629  WBC 6.4 5.7  HGB 10.3* 10.7*  HCT 32.1*  33.1*  MCV 82.9 83.2  PLT 215 [id number]    Basic Metabolic Panel:  Recent Labs Lab 09/05/15 0622 09/06/15 0629  NA 146* 146*  K 3.6 3.4*  CL 107 111  CO2 25 24  GLUCOSE 132* 97  BUN 60* 39*  CREATININE 2.09* 1.42*  CALCIUM 9.2 9.0    Lipid Panel:    Component Value Date/Time   CHOL 151 08/25/2015 0353   TRIG 168* 08/25/2015 0353   HDL 47 08/25/2015 0353   CHOLHDL 3.2 08/25/2015 0353   VLDL 34 08/25/2015 0353   LDLCALC 70 08/25/2015 0353   HgbA1c:  Lab Results  Component Value Date   HGBA1C 10.2* 08/25/2015   Urine Drug Screen: No results found for: LABOPIA, COCAINSCRNUR, LABBENZ, AMPHETMU, THCU, LABBARB    IMAGING  Mr Brain Wo Contrast 09/05/2015   1. Mild acute extension of the lacunar type right cerebellar infarct along the lateral wall of the fourth ventricle. No associated hemorrhage or mass effect.  2. Superimposed acute lacunar infarct in the lateral right thalamus with associated microhemorrhage. No mass effect.  3. Otherwise stable advanced chronic small vessel disease in the brainstem and thalami.     Dg Esophagus 09/05/2015   1. High-grade distal esophageal stricture. This is indeterminate in etiology and could reflect a benign stricture from chronic reflux. Endoscopic evaluation recommended to exclude malignancy.  2. Moderate esophageal dysmotility.    PHYSICAL EXAM  Physical exam: Exam: Gen: NAD, conversant, well nourised, obese, well groomed  CV: RRR, no MRG. No Carotid Bruits. No peripheral edema, warm, nontender Eyes: Conjunctivae clear without exudates or hemorrhage  Neuro: Detailed Neurologic Exam  Speech:    Speech is normal; fluent and spontaneous with normal comprehension.  Cognition:    The patient is oriented to person, place, and time;  Follows comands.  Cranial Nerves:    The pupils are equal, round, and reactive to light. The fundi are normal and spontaneous venous pulsations are present. Visual fields are full  to finger confrontation. Extraocular movements are intact. Trigeminal sensation is intact and the muscles of mastication are normal. The face is symmetric. The palate elevates in the midline. Hearing intact to voice. Voice is normal. Shoulder shrug is normal. The tongue has normal motion without fasciculations.   Motor Observation:    No asymmetry, no atrophy, and no involuntary movements noted. Tone:    Normal muscle tone.      Strength:    Strength is V/V in the upper and lower limbs.      Sensation: intact to LT     Reflex Exam:  DTR's:    Deep tendon reflexes in the upper and lower extremities are normal bilaterally.   Toes:    The toes are downgoing bilaterally.   Clonus:    Clonus is absent.       ASSESSMENT/PLAN [name] is a 82 y.o. female with history of a recent stroke, gastroesophageal reflux disease, obesity, diabetes mellitus, hypertension, hyperlipidemia, coronary artery disease, breast cancer with recent surgery, and recent coffee-ground emesis presenting with dizziness, gait instability, nausea and vomiting. She did not receive IV t-PA due to recent stroke and coffee-ground emesis.  Stroke:  Non-dominant infarcts possibly embolic from an unknown source.  Resultant  resolved  MRI  Mild acute extension of the lacunar type right cerebellar infarct with acute lacunar infarct in the lateral right thalamus  MRA  08/25/2015 - no high-grade stenosis  Carotid Doppler - MRA head and neck 08/25/2015 no flow reducing lesions  2D Echo - 08/26/2015 - EF 60-65%. No cardiac source of emboli identified.  LDL - 70 on 08/25/2015  HgbA1c 10.2 on 08/25/2015  VTE prophylaxis - SCDs  Diet clear liquid Room service appropriate?: Yes; Fluid consistency:: Thin  Plavix prior to admission, now on aspirin 81 mg daily  Patient counseled to be compliant with her antithrombotic medications  Ongoing aggressive stroke risk factor management  Therapy recommendations:  SNF recommended  Disposition:  Pending  Hypertension  Stable  Permissive hypertension (OK if < 220/120) but gradually normalize in 5-7 days  Hyperlipidemia  Home meds:  Crestor 5 mg daily not resumed in hospital  LDL 70, goal < 70  Continue statin at discharge  Diabetes  HgbA1c 10.2 from previous admission - goal < 7.0  Uncontrolled  Other Stroke Risk Factors  Advanced age  Hx stroke/TIA  Coronary artery disease   Other Active Problems  Recent coffee-ground emesis - endoscopy planned for Monday  Hospital day # 2   Personally examined patient and images, and have participated in and made any corrections needed to history, physical, neuro exam,assessment and plan as stated above.  I have personally obtained the history, evaluated lab date, reviewed imaging studies and agree with radiology interpretations.    Sarina Ill, MD Stroke Neurology [phone] [facility]      To contact Stroke Continuity provider, please refer to [email or web address] After hours, contact General Neurology

## 2015-09-08 ENCOUNTER — Inpatient Hospital Stay (HOSPITAL_COMMUNITY): Payer: Medicare Other | Admitting: Certified Registered Nurse Anesthetist

## 2015-09-08 ENCOUNTER — Ambulatory Visit: Payer: Medicare Other

## 2015-09-08 ENCOUNTER — Ambulatory Visit: Payer: Medicare Other | Admitting: Radiation Oncology

## 2015-09-08 ENCOUNTER — Encounter (HOSPITAL_COMMUNITY)
Admission: EM | Disposition: A | Discharge status: Skilled Nursing Facility | Payer: Self-pay | Source: Home / Self Care | Attending: Internal Medicine

## 2015-09-08 ENCOUNTER — Encounter (HOSPITAL_COMMUNITY): Payer: Self-pay | Admitting: *Deleted

## 2015-09-08 DIAGNOSIS — I6330 Cerebral infarction due to thrombosis of unspecified cerebral artery: Secondary | ICD-10-CM

## 2015-09-08 HISTORY — PX: ESOPHAGOGASTRODUODENOSCOPY (EGD) WITH PROPOFOL: SHX5813

## 2015-09-08 LAB — HEMOGLOBIN AND HEMATOCRIT, BLOOD
HEMATOCRIT: 34 % — AB (ref 36.0–46.0)
HEMOGLOBIN: 10.9 g/dL — AB (ref 12.0–15.0)

## 2015-09-08 LAB — GLUCOSE, CAPILLARY
GLUCOSE-CAPILLARY: 107 mg/dL — AB (ref 65–99)
GLUCOSE-CAPILLARY: 163 mg/dL — AB (ref 65–99)
GLUCOSE-CAPILLARY: 266 mg/dL — AB (ref 65–99)
GLUCOSE-CAPILLARY: 280 mg/dL — AB (ref 65–99)
GLUCOSE-CAPILLARY: 93 mg/dL (ref 65–99)
Glucose-Capillary: 140 mg/dL — ABNORMAL HIGH (ref 65–99)

## 2015-09-08 LAB — POTASSIUM: Potassium: 4.4 mmol/L (ref 3.5–5.1)

## 2015-09-08 SURGERY — ESOPHAGOGASTRODUODENOSCOPY (EGD) WITH PROPOFOL
Anesthesia: Monitor Anesthesia Care | Laterality: Left

## 2015-09-08 MED ORDER — PROPOFOL 10 MG/ML IV BOLUS
INTRAVENOUS | Status: DC | PRN
  Administered 2015-09-08: 50 mg via INTRAVENOUS
  Administered 2015-09-08: 10 mg via INTRAVENOUS
  Administered 2015-09-08: 40 mg via INTRAVENOUS
  Administered 2015-09-08: 30 mg via INTRAVENOUS

## 2015-09-08 MED ORDER — PANTOPRAZOLE SODIUM 40 MG PO TBEC: 40.0000 mg | DELAYED_RELEASE_TABLET | Freq: Every day | ORAL | Status: DC

## 2015-09-08 MED ORDER — LIDOCAINE HCL (CARDIAC) 20 MG/ML IV SOLN
INTRAVENOUS | Status: DC | PRN
  Administered 2015-09-08: 30 mg via INTRAVENOUS

## 2015-09-08 MED ORDER — NYSTATIN 100000 UNIT/ML MT SUSP
5.0000 mL | Freq: Four times a day (QID) | OROMUCOSAL | Status: DC
  Administered 2015-09-08 – 2015-09-09 (×4): 500000 [IU] via ORAL
  Filled 2015-09-08 (×4): qty 5

## 2015-09-08 MED ORDER — LACTATED RINGERS IV SOLN
INTRAVENOUS | Status: DC | PRN
  Administered 2015-09-08: 10:00:00 via INTRAVENOUS

## 2015-09-08 MED ORDER — SODIUM CHLORIDE 0.9 % IV SOLN: INTRAVENOUS | Status: DC

## 2015-09-08 MED ORDER — CLOPIDOGREL BISULFATE 75 MG PO TABS
75.0000 mg | ORAL_TABLET | Freq: Every day | ORAL | Status: DC
  Administered 2015-09-09: 75 mg via ORAL
  Filled 2015-09-08: qty 1

## 2015-09-08 MED ORDER — ROSUVASTATIN CALCIUM 5 MG PO TABS
5.0000 mg | ORAL_TABLET | Freq: Every day | ORAL | Status: DC
  Administered 2015-09-09: 5 mg via ORAL
  Filled 2015-09-08: qty 1

## 2015-09-08 NOTE — Anesthesia Postprocedure Evaluation (Signed)
Anesthesia Post Note  Patient: Glenda [name]  Procedure(s) Performed: Procedure(s) (LRB): ESOPHAGOGASTRODUODENOSCOPY (EGD) WITH PROPOFOL (Left)  Patient location during evaluation: Endoscopy Anesthesia Type: MAC Level of consciousness: awake, awake and alert and oriented Pain management: pain level controlled Vital Signs Assessment: post-procedure vital signs reviewed and stable Respiratory status: spontaneous breathing, nonlabored ventilation and respiratory function stable Cardiovascular status: blood pressure returned to baseline Anesthetic complications: no    Last Vitals:  Filed Vitals:   09/08/15 1045 09/08/15 1050  BP: 117/40 150/44  Pulse: 64 66  Temp:    Resp: 27 19    Last Pain:  Filed Vitals:   09/08/15 1052  PainSc: Asleep                 Kit Mollett COKER

## 2015-09-08 NOTE — Progress Notes (Signed)
Glenda [name] 10:02 AM  Subjective: Patient doing well and her hospital computer chart was reviewed and her case discussed with my partner Dr. Paulita Fujita and she was seen and examined and discussed with her daughter and her barium swallow was reviewed  Objective: Vital signs stable afebrile exam see please see preassessment evaluation hemoglobin stable  Assessment: Dysphasia esophageal stricture  Plan: The risks benefits methods of endoscopy and possible biopsy or dilation was discussed with the patient and her daughter and will proceed this morning with anesthesia assistance with further workup and plans pending those findings  [facility]  Pager [phone] After 5PM or if no answer call [phone]

## 2015-09-08 NOTE — Progress Notes (Addendum)
STROKE TEAM PROGRESS NOTE   HISTORY OF PRESENT ILLNESS Glenda [name] is a 82 y.o. female recently admitted to [facility] from 08/24/2015 until 08/28/2015 for evaluation of dizziness, nausea, vomiting, and gait instability. An MRI on 08/24/2015 revealed a punctate acute infarct in the right middle cerebellar peduncle. The patient was seen in consultation by Dr. Leonel Ramsay on 08/24/2015 and subsequently followed by Dr. Leonie Man. The infarct was felt secondary to small vessel disease. The patient had been on aspirin 325 mg daily with Plavix 75 mg daily in the past; however, when seen by Dr. Leonie Man the aspirin was discontinued. She was however discharged to a nursing facility for short-term rehabilitation on 08/27/2005 on aspirin 81 mg daily with Plavix 75 mg daily.   The patient was readmitted yesterday with nausea, vomiting, and dizziness. She was noted to have coffee-ground emesis. An MRI performed 09/05/2015 showed a mild acute extension of the lacunar type right cerebellar infarct. Neurology was asked to reconsult. The patient is currently off all antiplatelet therapy secondary to recent coffee-ground emesis. At this time she has no specific complaints and states that she feels better than during her previous admission.  Date last known well: 09/05/2015 Time last known well: Unable to determine tPA Given: No. Recent stroke and recent coffee-ground emesis.   SUBJECTIVE (INTERVAL HISTORY) Family at bedside, symptoms resolved. Pt and daughter stated that her plavix was on hold for EGD with esophageal stricture dilation. Pt tolerating procedure well.    OBJECTIVE Temp:  [98 F (36.7 C)-98.9 F (37.2 C)] 98.6 F (37 C) (05/15 2209) Pulse Rate:  [64-71] 70 (05/15 2209) Cardiac Rhythm:  [-] Normal sinus rhythm (05/15 1900) Resp:  [17-27] 18 (05/15 2209) BP: (117-188)/(40-63) 166/50 mmHg (05/15 2209) SpO2:  [98 %-100 %] 98 % (05/15 2209)  CBC:   Recent Labs Lab 09/05/15 0622  09/06/15 0629 09/08/15 0756  WBC 6.4 5.7  --   HGB 10.3* 10.7* 10.9*  HCT 32.1* 33.1* 34.0*  MCV 82.9 83.2  --   PLT 215 202  --     Basic Metabolic Panel:   Recent Labs Lab 09/05/15 0622 09/06/15 0629 09/08/15 0736  NA 146* 146*  --   K 3.6 3.4* 4.4  CL 107 111  --   CO2 25 24  --   GLUCOSE 132* 97  --   BUN 60* 39*  --   CREATININE 2.09* 1.42*  --   CALCIUM 9.2 9.0  --     Lipid Panel:     Component Value Date/Time   CHOL 151 08/25/2015 0353   TRIG 168* 08/25/2015 0353   HDL 47 08/25/2015 0353   CHOLHDL 3.2 08/25/2015 0353   VLDL 34 08/25/2015 0353   LDLCALC 70 08/25/2015 0353   HgbA1c:  Lab Results  Component Value Date   HGBA1C 10.2* 08/25/2015   Urine Drug Screen: No results found for: LABOPIA, COCAINSCRNUR, LABBENZ, AMPHETMU, THCU, LABBARB    IMAGING  Mr Brain Wo Contrast 09/05/2015   1. Mild acute extension of the lacunar type right cerebellar infarct along the lateral wall of the fourth ventricle. No associated hemorrhage or mass effect.  2. Superimposed acute lacunar infarct in the lateral right thalamus with associated microhemorrhage. No mass effect.  3. Otherwise stable advanced chronic small vessel disease in the brainstem and thalami.   Dg Esophagus 09/05/2015   1. High-grade distal esophageal stricture. This is indeterminate in etiology and could reflect a benign stricture from chronic reflux. Endoscopic evaluation recommended to exclude  malignancy.  2. Moderate esophageal dysmotility.    PHYSICAL EXAM  Physical exam: Exam: Gen: NAD, conversant, well nourised, obese, well groomed                     CV: RRR, no MRG. No Carotid Bruits. No peripheral edema, warm, nontender Eyes: Conjunctivae clear without exudates or hemorrhage  Neuro: Detailed Neurologic Exam  Speech:    Speech is normal; fluent and spontaneous with normal comprehension.  Cognition:    The patient is oriented to person, place, and time;  Follows comands.  Cranial  Nerves:    The pupils are equal, round, and reactive to light. The fundi are normal and spontaneous venous pulsations are present. Visual fields are full to finger confrontation. Extraocular movements are intact. Trigeminal sensation is intact and the muscles of mastication are normal. The face is symmetric. The palate elevates in the midline. Hearing intact to voice. Voice is normal. Shoulder shrug is normal. The tongue has normal motion without fasciculations.   Motor Observation:    No asymmetry, no atrophy, and no involuntary movements noted. Tone:    Normal muscle tone.      Strength:    Strength is V/V in the upper and lower limbs.      Sensation: intact to LT  Coordinatoin: dysmetria on left FTN     Reflex Exam:  DTR's:    Deep tendon reflexes in the upper and lower extremities are normal bilaterally.   Toes:    The toes are downgoing bilaterally.   Clonus:    Clonus is absent.   ASSESSMENT/PLAN [name] is a 82 y.o. female with history of a recent stroke, gastroesophageal reflux disease, obesity, diabetes mellitus, hypertension, hyperlipidemia, coronary artery disease, breast cancer with recent surgery, and recent coffee-ground emesis presenting with dizziness, gait instability, nausea and vomiting. She did not receive IV t-PA due to recent stroke and coffee-ground emesis.  Stroke:  Right middle cerebellar peduncle and right thalamic infarcts, possibly small vessel disease, however, cardioembolic can not be completely excluded. Recommend 30 day cardiac event monitoring  Resultant  resolved  MRI  Mild acute extension of the lacunar type right cerebellar infarct with acute lacunar infarct in the lateral right thalamus  MRA  08/25/2015 - no high-grade stenosis  MRA neck 08/25/2015 no flow reducing lesions  2D Echo - 08/26/2015 - EF 60-65%. No cardiac source of emboli identified.  Recommend 30 day cardiac event monitoring to rule out afib  LDL - 70 on  08/25/2015  HgbA1c 10.2 on 08/25/2015  VTE prophylaxis - SCDs DIET SOFT Room service appropriate?: Yes; Fluid consistency:: Thin  DAPT prior to admission, now on aspirin 81 mg daily. Recommend to resume DAPT.   Patient counseled to be compliant with her antithrombotic medications  Ongoing aggressive stroke risk factor management  Therapy recommendations: SNF  Disposition:  Pending  Nausea/Vomiting  Likely due to esophageal stricture  S/p EGD procedure with esophageal dilation  Avoid hypotension and dehydration  Hypertension  Stable BP goal normotensive  Hyperlipidemia  Home meds:  Crestor 5 mg daily not resumed in hospital  LDL 70, goal < 70  Continue statin at discharge  Diabetes, uncontrolled with hyperglycemia  HgbA1c 10.2 from previous admission - goal < 7.0  Uncontrolled  SSI  Close follow up with PCP.  Other Stroke Risk Factors  Advanced age  Hx stroke/TIA  Coronary artery disease  Other Active Problems    Hospital day # 3  Neurology will sign  off. Please call with questions. Pt will follow up with carolyn Hassell Done at [facility] in about 2 months. Thanks for the consult.  Rosalin Hawking, MD PhD Stroke Neurology 09/08/2015 11:37 PM     To contact Stroke Continuity provider, please refer to [email or web address] After hours, contact General Neurology

## 2015-09-08 NOTE — Transfer of Care (Signed)
Immediate Anesthesia Transfer of Care Note  Patient: Glenda [name]  Procedure(s) Performed: Procedure(s): ESOPHAGOGASTRODUODENOSCOPY (EGD) WITH PROPOFOL (Left)  Patient Location: Endoscopy Unit  Anesthesia Type:MAC  Level of Consciousness: awake, alert  and oriented  Airway & Oxygen Therapy: Patient Spontanous Breathing and Patient connected to nasal cannula oxygen  Post-op Assessment: Report given to RN and Post -op Vital signs reviewed and stable  Post vital signs: Reviewed and stable  Last Vitals:  Filed Vitals:   09/08/15 0517 09/08/15 0934  BP: 164/63 188/54  Pulse: 64 65  Temp: 36.9 C 36.9 C  Resp: 18 18    Last Pain:  Filed Vitals:   09/08/15 0935  PainSc: Asleep         Complications: No apparent anesthesia complications

## 2015-09-08 NOTE — Anesthesia Preprocedure Evaluation (Addendum)
Anesthesia Evaluation  Patient identified by MRN, date of birth, ID band Patient awake    Reviewed: Allergy & Precautions, NPO status , Patient's Chart, lab work & pertinent test results  Airway Mallampati: III  TM Distance: >3 FB Neck ROM: Full    Dental  (+) Partial Upper   Pulmonary neg pulmonary ROS,    breath sounds clear to auscultation       Cardiovascular hypertension, Pt. on medications + CAD and + Peripheral Vascular Disease   Rhythm:Regular Rate:Normal     Neuro/Psych CVA    GI/Hepatic Neg liver ROS, GERD  ,  Endo/Other  diabetes, Type 2, Insulin Dependent  Renal/GU CRFRenal disease     Musculoskeletal  (+) Arthritis ,   Abdominal   Peds  Hematology  (+) anemia ,   Anesthesia Other Findings - Left ventricle: The cavity size was normal. There was mild  concentric hypertrophy. Systolic function was normal. The  estimated ejection fraction was in the range of 60% to 65%. There  was dynamic obstruction at restin the mid cavity, with a peak  velocity of 166 cm/sec and a peak gradient of 11 mm Hg. Wall  motion was normal; there were no regional wall motion  abnormalities. Doppler parameters are consistent with abnormal  left ventricular relaxation (grade 1 diastolic dysfunction).  Doppler parameters are consistent with high ventricular filling  pressure. - Aortic valve: Transvalvular velocity was within the normal range.  There was no stenosis. There was mild regurgitation. - Mitral valve: There was trivial regurgitation. - Right ventricle: The cavity size was normal. Wall thickness was  normal. Systolic function was normal. - Tricuspid valve: There was mild regurgitation. - Pulmonary arteries: Systolic pressure was mildly increased. PA  peak pressure: 39 mm Hg (S).  Reproductive/Obstetrics                           Lab Results  Component Value Date   WBC 5.7  09/06/2015   HGB 10.9* 09/08/2015   HCT 34.0* 09/08/2015   MCV 83.2 09/06/2015   PLT 202 09/06/2015   Lab Results  Component Value Date   CREATININE 1.42* 09/06/2015   BUN 39* 09/06/2015   NA 146* 09/06/2015   K 4.4 09/08/2015   CL 111 09/06/2015   CO2 24 09/06/2015    Anesthesia Physical Anesthesia Plan  ASA: III  Anesthesia Plan: MAC   Post-op Pain Management:    Induction: Intravenous  Airway Management Planned: Natural Airway and Nasal Cannula  Additional Equipment:   Intra-op Plan:   Post-operative Plan:   Informed Consent: I have reviewed the patients History and Physical, chart, labs and discussed the procedure including the risks, benefits and alternatives for the proposed anesthesia with the patient or authorized representative who has indicated his/her understanding and acceptance.     Plan Discussed with: CRNA  Anesthesia Plan Comments:         Anesthesia Quick Evaluation

## 2015-09-08 NOTE — Progress Notes (Signed)
OT Cancellation Note  Patient Details Name: Glenda [name] MRN: [id number] DOB: [birth year]   Cancelled Treatment:    Reason Eval/Treat Not Completed: Patient at procedure or test/ unavailable. Will check back as schedule allows for OT eval/treat. Thank you for the order.   Chrys Racer , MS, OTR/L, CLT Pager: [phone]  09/08/2015, 10:09 AM

## 2015-09-08 NOTE — Care Management Important Message (Signed)
Important Message  Patient Details  Name: Glenda [name] MRN: [id number] Date of Birth: [birth year]   Medicare Important Message Given:  Yes    Carles Collet, RN 09/08/2015, 1:33 [place] Message  Patient Details  Name: [name] MRN: [id number] Date of Birth: [birth year]   Medicare Important Message Given:  Yes    Carles Collet, RN 09/08/2015, 1:33 PM

## 2015-09-08 NOTE — Progress Notes (Signed)
Physical Therapy Treatment Patient Details Name: Glenda [name] MRN: [id number] DOB: [birth year] Today's Date: 09/08/2015    History of Present Illness pt presents with nausea and vomiting, and was found to have an extension of her R Cerebellar Infarct and new R Lateral Thalamus Infarct.  pt with hx of DM, HTN, CAD, Breast CA, and CVA.      PT Comments    Patient continues with sense of vertigo and double vision. Educated on compensatory gaze stabilization and pt able to utilize technique to lessen vertigo (with cues).   Follow Up Recommendations  SNF;Supervision/Assistance - 24 hour     Equipment Recommendations  None recommended by PT    Recommendations for Other Services       Precautions / Restrictions Precautions Precautions: Fall Restrictions Weight Bearing Restrictions: No    Mobility  Bed Mobility               General bed mobility comments: OOB in chair  Transfers Overall transfer level: Needs assistance Equipment used: 2 person hand held assist (to countertop) Transfers: Sit to/from Stand Sit to Stand: Mod assist;Min assist         General transfer comment: pt needs cues for safe technique and sequencing.  pt with R lateral lean during each transfer., became less each trial; x 4  Ambulation/Gait Ambulation/Gait assistance: Mod assist;+2 physical assistance;+2 safety/equipment Ambulation Distance (Feet): 6 Feet Assistive device: 2 person hand held assist Gait Pattern/deviations: Step-through pattern;Decreased stride length;Decreased weight shift to left;Narrow base of support Gait velocity: decreased   General Gait Details: continued Rt lean with narrow base of support; pt able to follow cues to step wider with Lt foot and increaase weight-shift towards midline   Stairs            Wheelchair Mobility    Modified Rankin (Stroke Patients Only) Modified Rankin (Stroke Patients Only) Pre-Morbid Rankin Score: Moderately severe  disability Modified Rankin: Moderately severe disability     Balance   Sitting-balance support: No upper extremity supported;Feet supported Sitting balance-Leahy Scale: Fair     Standing balance support: Bilateral upper extremity supported Standing balance-Leahy Scale: Poor Standing balance comment: standing at sink in front of mirror, shifting Rt & Lt with return to midline (pt heavy reliance on Rt UE. Sat to rest twice during work at [employer]: Awake/alert Behavior During Therapy: Flat affect Overall Cognitive Status: Within Functional Limits for tasks assessed                      Exercises General Exercises - Lower Extremity Ankle Circles/Pumps: AROM;Both;5 reps Quad Sets: AROM;Both;5 reps Short Arc Quad: AROM;Strengthening;Both;Other reps (comment) Long Arc Quad: AROM;Both    General Comments General comments (skin integrity, edema, etc.): Daughter present      Pertinent Vitals/Pain Pain Assessment: No/denies pain    Home Living                      Prior Function            PT Goals (current goals can now be found in the care plan section) Acute Rehab PT Goals Patient Stated Goal: Get better. Time For Goal Achievement: 09/20/15 Progress towards PT goals: Progressing toward goals    Frequency  Min 3X/week    PT Plan Current plan remains appropriate    Co-evaluation  End of Session Equipment Utilized During Treatment: Gait belt Activity Tolerance: Patient limited by fatigue Patient left: with call bell/phone within reach;with family/visitor present;in chair;with chair alarm set     Time: 1356-1420 PT Time Calculation (min) (ACUTE ONLY): 24 min  Charges:  $Gait Training: 8-22 mins $Therapeutic Activity: 8-22 mins                    G Codes:      Glenda [name] 14-Sep-2015, 2:49 PM Pager [phone]

## 2015-09-08 NOTE — Care Management Note (Signed)
Case Management Note  Patient Details  Name: Glenda [name] MRN: [id number] Date of Birth: 10-22-1933  Subjective/Objective:                 Patient admitted from [facility] with N/V. EGD today, anticipate DC tomorrow.   Action/Plan:  Anticipate DC to SNF tomorrow as facilitated by CSW.  Expected Discharge Date:                  Expected Discharge Plan:  [place] (From [facility])  In-House Referral:  Clinical Social Work  Discharge planning Services     Post Acute Care Choice:    Choice offered to:     DME Arranged:    DME Agency:     HH Arranged:    [place] Agency:     Status of Service:  In process, will continue to follow  Medicare Important Message Given:  Yes Date Medicare IM Given:    Medicare IM give by:    Date Additional Medicare IM Given:    Additional Medicare Important Message give by:     If discussed at [place] of Stay Meetings, dates discussed:    Additional Comments:  Carles Collet, RN 09/08/2015, 1:34 PM

## 2015-09-08 NOTE — Anesthesia Procedure Notes (Signed)
Procedure Name: MAC Date/Time: 09/08/2015 10:12 AM Performed by: Neldon Newport Pre-anesthesia Checklist: Patient being monitored, Suction available, Patient identified, Emergency Drugs available and Timeout performed Oxygen Delivery Method: Nasal cannula

## 2015-09-08 NOTE — Progress Notes (Signed)
PROGRESS NOTE        PATIENT DETAILS Name: Glenda [name] Age: 82 y.o. Sex: female Date of Birth: 1933/10/07 Admit Date: 09/04/2015 Admitting Physician Rise Patience, MD [id number] Alroy Dust, MD  Brief Narrative: Patient is a 82 y.o. female with recent right cerebellar CVA brought back to the hospital from skilled nursing facility for evaluation of nausea and vomiting. Further evaluation positive for distal esophageal stricture, and MRI brain positive for extension of her right cerebellar infarct, and a new small foci of infarct right lateral thalamus.  Subjective:  In bed, denies any headache, no new weakness, no chest abdominal pain, is better after EGD.  Assessment/Plan:  Nausea & vomiting: Etiology not certain-not sure if right cerebellar infarct can account for these symptoms. Alternatively, could be from high-grade esophageal stricture seen on barium esophagogram. Downgraded to clear liquid diet, supportive care, gastroenterology following and underwent EGD on 09/08/2015 with stricture dilation by Dr. Daisey Must. Continue PPI, continue nystatin for a few days per GI. Advance diet and monitor.   Coffee-ground emesis/UGI bleed: Doubt active GI bleeding, could have had a small mucosal tear. Seems to have resolved spontaneously. Hemoglobin appears to be stable, continue PPI, EGD as above.  Dysphagia: See #1 above with EGD for esophageal dilation, PPI and statin. Advance status tolerated.  Extension of recent right cerebellar infarct, and now new acute thalamic infarct: Although could be embolic, suspect likely secondary to dehydration-causing ischemia-due to underlying small vessel disease. Currently placed on aspirin after discussions with GI physician Dr. Paulita Fujita and may guard, we will place her back on Plavix tomorrow.    Acute kidney injury on chronic kidney disease stage III: AKI likely secondary to prerenal azotemia in a setting of dehydration-creatinine  improving with IV fluids. Baseline 1.3  Type 2 diabetes: CBGs currently stable-but were borderline low this morning, will just continue SSI and discontinue Lantus for now. As diet slowly advances, will slowly place back on usual regular insulin regimen.  CBG (last 3)   Recent Labs  09/08/15 0025 09/08/15 0517 09/08/15 0757  GLUCAP 163* 107* 140*     Uncontrolled hypertension: Due to junctional rhythm discontinued beta blocker and Catapres, currently on Norvasc along with hydralazine. We'll monitor.  History of breast cancer: Being followed by Dr. Lindi Adie in the outpatient setting-encouraged her to keep her next appointment.  One episode of junctional rhythm on telemetry. Asymptomatic, discontinued Catapres patch & B blcoker, discussed with cardiologist on-call doctor Dr. Aundra Dubin, no further workup. TSH stable. Will monitor. EKG unremarkable.  Lab Results  Component Value Date   TSH 0.991 09/07/2015     DVT Prophylaxis: SCD's  Code Status: Full code  Family Communication: Daughter at bedside  Disposition Plan: Remain inpatient-but will plan on Home health vs SNF On Tuesday possibly  Antimicrobial agents: None  Procedures:  EGD Done 09/08/2015 by Dr. Driscilla Grammes. Stricture was dilated.  CONSULTS:   GI and neurology  Time spent:  25- minutes-Greater than 50% of this time was spent in counseling, explanation of diagnosis, planning of further management, and coordination of care.  MEDICATIONS:  Anti-infectives    None      Scheduled Meds: . amLODipine  10 mg Oral Daily  . antiseptic oral rinse  7 mL Mouth Rinse BID  . aspirin  81 mg Oral Daily  . feeding supplement  1 Container Oral BID BM  .  hydrALAZINE  50 mg Oral Q8H  . insulin aspart  0-9 Units Subcutaneous Q4H  . pantoprazole (PROTONIX) IV  40 mg Intravenous Q12H   Continuous Infusions:   PRN Meds:.clonazePAM, hydrALAZINE   PHYSICAL EXAM:  Vital signs:  Filed Vitals:   09/08/15 1044 09/08/15  1045 09/08/15 1050 09/08/15 1100  BP: 117/40 117/40 150/44 151/46  Pulse: 64 64 66 66  Temp: 98.9 F (37.2 C)     TempSrc: Oral     Resp: 22 27 19 18   Height:      Weight:      SpO2:  98% 100% 99%   Filed Weights   09/06/15 1000  Weight: 65 kg (143 lb 4.8 oz)   Body mass index is 21.15 kg/(m^2).   Gen Exam: Awake but confused. Not in any distress  Neck: Supple, No JVD.   Chest: B/L Clear.   CVS: S1 S2 Regular, no murmurs.  Abdomen: soft, BS +, non tender, non distended.  Extremities: no edema, lower extremities warm to touch. Neurologic: Non Focal- But does have some mild coordination issues in the right upper and right lower extremity, mild R facial droop Skin: No Rash or lesions   Wounds: N/A.    LABORATORY DATA: CBC:  Recent Labs Lab 09/04/15 1940 09/05/15 0245 09/05/15 0622 09/06/15 0629 09/08/15 0756  WBC 8.3 7.0 6.4 5.7  --   HGB 12.2 10.6* 10.3* 10.7* 10.9*  HCT 37.6 31.8* 32.1* 33.1* 34.0*  MCV 81.0 81.3 82.9 83.2  --   PLT 279 190 215 202  --     Basic Metabolic Panel:  Recent Labs Lab 09/04/15 1940 09/05/15 0622 09/06/15 0629 09/08/15 0736  NA 139 146* 146*  --   K 4.8 3.6 3.4* 4.4  CL 101 107 111  --   CO2 22 25 24   --   GLUCOSE 256* 132* 97  --   BUN 73* 60* 39*  --   CREATININE 2.68* 2.09* 1.42*  --   CALCIUM 10.0 9.2 9.0  --     GFR: Estimated Creatinine Clearance: 33 mL/min (by C-G formula based on Cr of 1.42).  Liver Function Tests:  Recent Labs Lab 09/04/15 1940 09/05/15 0622  AST 33 17  ALT 12* 10*  ALKPHOS 112 88  BILITOT 1.3* 0.6  PROT 7.6 6.0*  ALBUMIN 3.6 2.8*   No results for input(s): LIPASE, AMYLASE in the last 168 hours. No results for input(s): AMMONIA in the last 168 hours.  Coagulation Profile:  Recent Labs Lab 09/04/15 2038  INR 1.02    Cardiac Enzymes:  Recent Labs Lab 09/05/15 0711  TROPONINI 0.03    BNP (last 3 results)  No results for input(s): PROBNP in the last 8760  hours.  HbA1C:  No results for input(s): HGBA1C in the last 72 hours.  CBG:  Recent Labs Lab 09/07/15 1721 09/07/15 2111 09/08/15 0025 09/08/15 0517 09/08/15 0757  GLUCAP 216* 197* 163* 107* 140*    Lipid Profile: No results for input(s): CHOL, HDL, LDLCALC, TRIG, CHOLHDL, LDLDIRECT in the last 72 hours.  Thyroid Function Tests:  Recent Labs  09/07/15 0733  TSH 0.991    Anemia Panel:  No results for input(s): VITAMINB12, FOLATE, FERRITIN, TIBC, IRON, RETICCTPCT in the last 72 hours.  Urine analysis:    Component Value Date/Time   COLORURINE YELLOW 08/24/2015 1824   APPEARANCEUR CLEAR 08/24/2015 1824   LABSPEC 1.018 08/24/2015 1824   PHURINE 5.0 08/24/2015 1824   GLUCOSEU 250* 08/24/2015 1824  HGBUR NEGATIVE 08/24/2015 1824   BILIRUBINUR NEGATIVE 08/24/2015 1824   KETONESUR NEGATIVE 08/24/2015 1824   PROTEINUR 100* 08/24/2015 1824   UROBILINOGEN 0.2 07/04/2014 1900   NITRITE NEGATIVE 08/24/2015 1824   LEUKOCYTESUR NEGATIVE 08/24/2015 1824    Sepsis Labs: Lactic Acid, Venous  No results found for: LATICACIDVEN  MICROBIOLOGY:  Recent Results (from the past 240 hour(s))  MRSA PCR Screening     Status: None   Collection Time: 09/05/15  1:44 AM  Result Value Ref Range Status   MRSA by PCR NEGATIVE NEGATIVE Final    Comment:        The GeneXpert MRSA Assay (FDA approved for NASAL specimens only), is one component of a comprehensive MRSA colonization surveillance program. It is not intended to diagnose MRSA infection nor to guide or monitor treatment for MRSA infections.     RADIOLOGY STUDIES/RESULTS:  Ct Head Wo Contrast  09/05/2015  CLINICAL DATA:  Vomiting. EXAM: CT HEAD WITHOUT CONTRAST TECHNIQUE: Contiguous axial images were obtained from the base of the skull through the vertex without intravenous contrast. COMPARISON:  08/24/2015 FINDINGS: Focal hypodensity in the right middle cerebellar peduncle directly lateral to the fourth ventricle  corresponds to the area of infarction observed on 08/24/2015. This may represent edema. This appears slightly larger than the area of infarction observed on 08/24/2015, and possibility of extension of the infarct is a consideration. There is no hemorrhagic transformation. There is no intracranial hemorrhage. There is no extra-axial fluid collection. There is mild generalized atrophy. There is white matter hypodensity which is chronic and likely due to small vessel disease. Hypodensities in the left pons may represent remote lacunar infarctions. There also is remote lacunar infarction in the left internal capsule posterior limb. No bony abnormalities. The visible paranasal sinuses and orbits are unremarkable. IMPRESSION: Focal hypodensity at the right lateral aspect of the fourth ventricle, corresponding to the location of the right cerebellar peduncle infarction observed on 08/24/2015 and likely representing edema. This could represent extension of that prior infarction. No hemorrhage. No other significant interval change. There is mild generalized atrophy and chronic white matter hypodensity which likely represents small vessel ischemic disease. MRI will be the modality of choice if additional imaging is clinically warranted. Electronically Signed   By: Andreas Newport M.D.   On: 09/05/2015 00:54   Mr Jodene Nam Head Wo Contrast  08/25/2015  CLINICAL DATA:  Continued surveillance of acute posterior circulation infarct. EXAM: MRA NECK WITHOUT AND WITH CONTRAST MRA HEAD WITHOUT CONTRAST TECHNIQUE: Multiplanar and multiecho pulse sequences of the neck were obtained without and with intravenous contrast. Angiographic images of the neck were obtained using MRA technique without and with intravenous contrast.; Angiographic images of the Circle of Willis were obtained using MRA technique without intravenous contrast. CONTRAST:  71mL MULTIHANCE GADOBENATE DIMEGLUMINE 529 MG/ML IV SOLN COMPARISON:  MR brain 08/24/2015.  FINDINGS: MRA NECK FINDINGS Dolichoectasia proximal vasculature. Conventional branching of the great vessels from the arch. The proximal RIGHT common carotid artery demonstrates a estimated 75% stenosis. No flow-limiting disease at the bifurcation or throughout the course of the visualized RIGHT cervical ICA. Poorly visualized proximal LEFT common carotid artery without definite proximal narrowing. No significant atheromatous change at the bifurcation. Cervical ICA widely patent on the LEFT. Both vertebral arteries are tortuous. They are equal in size. No definite ostial lesion. There may be a 75% stenosis of the proximal RIGHT vertebral 2 cm above its origin. MRA HEAD FINDINGS Mild non stenotic irregularity of the cavernous and supraclinoid  ICA segments on the RIGHT. 50% smooth stenosis of the cavernous and supraclinoid ICA segments on the LEFT. No proximal stenosis of the anterior or middle cerebral arteries. No MCA branch occlusion. No visible intracranial aneurysm. Basilar artery is widely patent. Both vertebrals contribute equally to its formation. There is no significant proximal PCA disease or cerebellar branch occlusion. IMPRESSION: 50% stenosis of the cavernous and supraclinoid ICA segments on the LEFT, non flow reducing. No significant intracranial posterior circulation disease. No extracranial carotid bifurcation or cervical ICA lesion. Proximal RIGHT common carotid artery and proximal RIGHT vertebral artery as described above. Electronically Signed   By: Staci Righter M.D.   On: 08/25/2015 09:06   Mr Angiogram Neck W Wo Contrast  08/25/2015  CLINICAL DATA:  Continued surveillance of acute posterior circulation infarct. EXAM: MRA NECK WITHOUT AND WITH CONTRAST MRA HEAD WITHOUT CONTRAST TECHNIQUE: Multiplanar and multiecho pulse sequences of the neck were obtained without and with intravenous contrast. Angiographic images of the neck were obtained using MRA technique without and with intravenous  contrast.; Angiographic images of the Circle of Willis were obtained using MRA technique without intravenous contrast. CONTRAST:  24mL MULTIHANCE GADOBENATE DIMEGLUMINE 529 MG/ML IV SOLN COMPARISON:  MR brain 08/24/2015. FINDINGS: MRA NECK FINDINGS Dolichoectasia proximal vasculature. Conventional branching of the great vessels from the arch. The proximal RIGHT common carotid artery demonstrates a estimated 75% stenosis. No flow-limiting disease at the bifurcation or throughout the course of the visualized RIGHT cervical ICA. Poorly visualized proximal LEFT common carotid artery without definite proximal narrowing. No significant atheromatous change at the bifurcation. Cervical ICA widely patent on the LEFT. Both vertebral arteries are tortuous. They are equal in size. No definite ostial lesion. There may be a 75% stenosis of the proximal RIGHT vertebral 2 cm above its origin. MRA HEAD FINDINGS Mild non stenotic irregularity of the cavernous and supraclinoid ICA segments on the RIGHT. 50% smooth stenosis of the cavernous and supraclinoid ICA segments on the LEFT. No proximal stenosis of the anterior or middle cerebral arteries. No MCA branch occlusion. No visible intracranial aneurysm. Basilar artery is widely patent. Both vertebrals contribute equally to its formation. There is no significant proximal PCA disease or cerebellar branch occlusion. IMPRESSION: 50% stenosis of the cavernous and supraclinoid ICA segments on the LEFT, non flow reducing. No significant intracranial posterior circulation disease. No extracranial carotid bifurcation or cervical ICA lesion. Proximal RIGHT common carotid artery and proximal RIGHT vertebral artery as described above. Electronically Signed   By: Staci Righter M.D.   On: 08/25/2015 09:06   Mr Brain Wo Contrast  09/05/2015  CLINICAL DATA:  82 year old female with small right cerebellar infarct in April. Persistent nausea vomiting, recurrent dizziness. Initial encounter. EXAM:  MRI HEAD WITHOUT CONTRAST TECHNIQUE: Multiplanar, multiecho pulse sequences of the brain and surrounding structures were obtained without intravenous contrast. COMPARISON:  Head CT without contrast midnight today. Brain MRI 08/24/2015 and earlier. FINDINGS: Major intracranial vascular flow voids are stable. There has been enlargement of the restricted diffusion along the right lateral aspect of the fourth ventricle in the right cerebellar hemisphere since April. The area now encompasses 9-10 mm, versus 3 mm previously. See series 5, image 14 and compare to series 4, image 15 previously. No other brainstem or cerebellar restricted diffusion. However, there is new curvilinear restricted diffusion in the lateral right thalamus near the posterior limb of the right internal capsule. Diffusion imaging elsewhere is stable. Mild T2 and FLAIR corresponding to the acute diffusion findings with no mass  effect. There does appear to be a new punctate micro hemorrhage along the caudal aspect of the right thalamic diffusion abnormality as seen on series 9, image 45. There is underlying advanced chronic small vessel disease in the brainstem and thalami I with multiple bilateral chronic lacunar infarcts. The left midbrain also is chronically affected. Aside from the acute findings the cerebellum is relatively spared. Patchy supratentorial white matter T2 and FLAIR hyperintensity is stable with no supratentorial cortical encephalomalacia. Scattered chronic micro hemorrhages are re- demonstrated in the posterior left hemisphere and brainstem. No midline shift, mass effect, evidence of mass lesion, ventriculomegaly, or other acute intracranial hemorrhage. Cervicomedullary junction and pituitary are within normal limits. Stable visualized cervical spine. Bone marrow signal is stable and within normal limits. Stable visualized internal auditory structures. Mastoids are clear. Trace paranasal sinus mucosal thickening is stable. Orbit and  scalp soft tissues are stable. IMPRESSION: 1. Mild acute extension of the lacunar type right cerebellar infarct along the lateral wall of the fourth ventricle. No associated hemorrhage or mass effect. 2. Superimposed acute lacunar infarct in the lateral right thalamus with associated microhemorrhage. No mass effect. 3. Otherwise stable advanced chronic small vessel disease in the brainstem and thalami. Electronically Signed   By: Genevie Ann M.D.   On: 09/05/2015 11:19   Mr Brain Wo Contrast  08/24/2015  CLINICAL DATA:  Ataxia with nausea and vomiting since yesterday. Assessment for stroke. EXAM: MRI HEAD WITHOUT CONTRAST TECHNIQUE: Multiplanar, multiecho pulse sequences of the brain and surrounding structures were obtained without intravenous contrast. COMPARISON:  07/04/2014 FINDINGS: There is a 4 mm acute infarct in the medial aspect of the right middle cerebellar peduncle. No intracranial hemorrhage, mass, midline shift, or extra-axial fluid collection is identified. Small chronic infarcts are seen in the pons and thalami. A chronic infarct in the posterior limb of the internal capsule was acute on the prior MRI. There is new wallerian degeneration along the left corticospinal tract in the cerebral peduncle. There is mild cerebral atrophy which is unchanged. Foci of T2 hyperintensity throughout the cerebral white matter and pons are similar to the prior MRI and nonspecific but compatible with moderate chronic small vessel ischemic disease. Prior bilateral cataract extraction is noted. There is a trace right mastoid effusion, and there is minimal left anterior ethmoid air cell mucosal thickening. Major intracranial vascular flow voids are preserved. IMPRESSION: 1. Punctate acute infarct in the right middle cerebellar peduncle. 2. Moderate chronic small vessel ischemic changes as above. Electronically Signed   By: Logan Bores M.D.   On: 08/24/2015 13:18   Dg Esophagus  09/05/2015  CLINICAL DATA:  82 year old  with recent stroke and chronic intermittent dysphagia, primarily for solids and pills. EXAM: ESOPHOGRAM/BARIUM SWALLOW TECHNIQUE: Single contrast examination was performed using  thin barium. FLUOROSCOPY TIME:  Radiation Exposure Index (as provided by the fluoroscopic device): 96.5 uGy*m2 If the device does not provide the exposure index: Fluoroscopy Time:  1 minutes. Number of Acquired Images: 1 spot image. Remainder of study obtained with fluoro store. COMPARISON:  None. FINDINGS: Limited study was performed with the patient in a semi erect position. Patient swallowed the contrast without difficulty. No aspiration was observed. There is moderate esophageal dysmotility with a decreased primary stripping wave and diffuse tertiary contractions. There is fixed luminal narrowing of the distal esophagus extending over approximately 1.4 cm. No definite mucosal ulceration is seen in this area. Some contrast did pass into the stomach. A 13 mm barium tablet was administered. This passed into  the distal esophagus, but not through the distal esophageal stricture. IMPRESSION: 1. High-grade distal esophageal stricture. This is indeterminate in etiology and could reflect a benign stricture from chronic reflux. Endoscopic evaluation recommended to exclude malignancy. 2. Moderate esophageal dysmotility. Electronically Signed   By: Richardean Sale M.D.   On: 09/05/2015 12:03   Dg Abd Acute W/chest  09/04/2015  CLINICAL DATA:  Coffee-ground emesis and melena beginning today. EXAM: DG ABDOMEN ACUTE W/ 1V CHEST COMPARISON:  08/24/2015 FINDINGS: There is no evidence of dilated bowel loops or free intraperitoneal air. No radiopaque calculi identified. Extensive vascular calcification noted within the abdomen pelvis. Severe lumbar spine degenerative changes also demonstrated. Heart size and mediastinal contours are within normal limits. Low lung volumes noted, however both lungs are clear. No evidence of pneumothorax or pleural  effusion. IMPRESSION: Nonspecific, nonobstructive bowel gas pattern. No active cardiopulmonary disease. Electronically Signed   By: Earle Gell M.D.   On: 09/04/2015 23:47   Dg Abd Portable 1v  08/24/2015  CLINICAL DATA:  Nausea vomiting and vertigo, 2 days duration. EXAM: PORTABLE ABDOMEN - 1 VIEW COMPARISON:  None. FINDINGS: The bowel gas pattern is normal. No radio-opaque calculi or other significant radiographic abnormality are seen. IMPRESSION: Negative. Electronically Signed   By: Andreas Newport M.D.   On: 08/24/2015 20:11     LOS: 3 days   Thurnell Lose, MD  [facility] Pager: [phone]  If 7PM-7AM, please contact night-coverage [email or web address] Password [email or web address] 09/08/2015, 11:14 AM

## 2015-09-08 NOTE — Evaluation (Signed)
Speech Language Pathology Evaluation Patient Details Name: Glenda [name] MRN: [id number] DOB: January 13, 1934 Today's Date: 09/08/2015 Time: [id number] SLP Time Calculation (min) (ACUTE ONLY): 25 min  Problem List:  Patient Active Problem List   Diagnosis Date Noted  . Cerebral thrombosis with cerebral infarction 09/07/2015  . Lacunar infarct, acute ([place])   . Nausea & vomiting 09/05/2015  . Acute kidney injury ([place]) 09/04/2015  . Type II diabetes mellitus with neurological manifestations, uncontrolled ([place]) 09/03/2015  . Bilateral lower extremity edema 09/03/2015  . Essential hypertension   . Diabetes mellitus type 2 in nonobese (H[facility])   . History of breast cancer   . AKI (acute kidney injury) ([place])   . Acute blood loss anemia   . Hemiparesis ([place])   . Gait disturbance, post-stroke   . Dysphagia, post-stroke   . Acute ischemic stroke/right cerebellar 08/24/2015  . Breast cancer of upper-outer quadrant of right female breast ([place]) 04/30/2015  . CVA (cerebral infarction) 07/05/2014  . Hyperlipidemia 07/05/2014  . Syncope 07/04/2014  . Mild mitral regurgitation by prior echocardiogram 12/20/2011  . Diabetes mellitus type 1, uncontrolled, insulin dependent ([place]) 02/26/2009  . OBESITY 02/26/2009  . Uncontrolled hypertension 02/26/2009  . CAD (coronary artery disease) 02/26/2009  . GASTROESOPHAGEAL REFLUX DISEASE 02/26/2009   Past Medical History:  Past Medical History  Diagnosis Date  . GERD (gastroesophageal reflux disease)   . Obesity   . DM (diabetes mellitus) ([place])   . HTN (hypertension)   . HLD (hyperlipidemia)   . CAD (coronary artery disease)   . UTI (urinary tract infection)   . Chest pain   . Dizziness   . Breast cancer ([place])   . Arthritis   . Stroke ([place])   . Shortness of breath dyspnea    Past Surgical History:  Past Surgical History  Procedure Laterality Date  . Cholecystectomy    . Colon polyps; hx    . Coronary angioplasty  02/14/09  . Radioactive seed  guided mastectomy with axillary sentinel lymph node biopsy Right 07/17/2015    Procedure: RADIOACTIVE SEED GUIDED PARTIAL MASTECTOMY WITH AXILLARY SENTINEL LYMPH NODE BIOPSY;  Surgeon: Erroll Luna, MD;  Location: [facility];  Service: General;  Laterality: Right;   HPI:  82 y.o. female with medical history significant of recent cerebellar stroke, hypertension, diabetes mellitus, breast cancer was brought to the ER after patient had persistent nausea vomiting. As per patient's daughter patient had similar presentation last time and patient had acute stroke but improved at discharge. Over the last 2 days patient has been having some dizziness with persistent nausea and vomiting. Denies abdominal pain or diarrhea. Patient also noticed to have some coffee-ground emesis. Stool for blood has been negative. ER physician and discuss with on-call gastroenterologist Dr. [name] Coop would be seeing patient in consult. Patient also has been some dysphagia over the last 2 days and found it difficult to swallow her pills; SLE ordered; pt NPO for EGD this am and dysphagia likely related to esophageal issues; no BSE ordered at this time. MR head indicated on 09/05/15 1. Mild acute extension of the lacunar type right cerebellar infarct along the lateral wall of the fourth ventricle. No associated hemorrhage or mass effect. 2. Superimposed acute lacunar infarct in the lateral right thalamus with associated microhemorrhage. No mass effect  Assessment / Plan / Recommendation Clinical Impression   Pt participated fully for SLE and flat affect noted throughout assessment (minimal) with responses; decreased auditory comprehension with multi-step directives, organizational tasks decreased as well with verbal cues  needed to complete categorization tasks; naming grossly WFL, with some anomia present with complex conversational tasks only; pt able to indicate safety precautions without difficulty and Ox4; cognition affected minimally, but  difficult to assess if this was pre-morbid or d/t recent CVA; visual changes with peripheral vision, but basic reading tasks [facility]; minimal writing assessed d/t dominant hand (right) weakness; ST will f/u while in hospital and recommend f/u at SNF.     SLP Assessment  Patient needs continued Speech Language Pathology Services    Follow Up Recommendations  Skilled Nursing facility    Frequency and Duration min 2x/week  1 week      SLP Evaluation Prior Functioning  Cognitive/Linguistic Baseline: Within functional limits Type of Home: House  Lives With: Daughter Available Help at Discharge: Family;Friend(s);Available 24 hours/day Vocation: Retired   Associate Professor  Overall Cognitive Status: Impaired/Different from baseline Arousal/Alertness: Awake/alert Orientation Level: Oriented X4 Memory: Impaired Memory Impairment: Retrieval deficit Awareness: Appears intact Problem Solving: Appears intact Reasoning: Appears intact Sequencing: Appears intact Organizing: Impaired Organizing Impairment: Verbal basic;Functional basic Decision Making: Appears intact Behaviors: Perseveration (mild) Safety/Judgment: Appears intact    Comprehension  Auditory Comprehension Overall Auditory Comprehension: Impaired Yes/No Questions: Within Functional Limits Commands: Impaired Conversation: Complex Visual Recognition/Discrimination Discrimination: Exceptions to [facility] Other Visual Recogniton/Discrimination Comments:  (peripheral vision changes) Reading Comprehension Reading Status: Impaired Word level: Within functional limits Sentence Level: Within functional limits Paragraph Level: Impaired Functional Environmental (signs, name badge): Within functional limits    Expression Expression Primary Mode of Expression: Verbal Verbal Expression Overall Verbal Expression: Appears within functional limits for tasks assessed Initiation: No impairment Level of Generative/Spontaneous Verbalization:  Conversation Repetition: No impairment Naming: No impairment Pragmatics: Impairment Impairments: Abnormal affect Non-Verbal Means of Communication: Not applicable Written Expression Dominant Hand: Right Written Expression: Within Functional Limits (basic writing tested only)   Oral / Motor  Oral Motor/Sensory Function Overall Oral Motor/Sensory Function: Mild impairment Facial ROM: Within Functional Limits Facial Symmetry: Abnormal symmetry right;Other (Comment) (slight) Facial Strength: Within Functional Limits Facial Sensation: Within Functional Limits Lingual ROM: Within Functional Limits Lingual Symmetry: Abnormal symmetry right;Other (Comment) (slight) Lingual Strength: Within Functional Limits Lingual Sensation: Within Functional Limits Velum: Within Functional Limits Mandible: Within Functional Limits Motor Speech Overall Motor Speech: Appears within functional limits for tasks assessed Respiration: Within functional limits Phonation: Normal Resonance: Within functional limits Articulation: Within functional limitis Intelligibility: Intelligible Motor Planning: Witnin functional limits Motor Speech Errors: Not applicable                       Alizee Maple,PAT, M.S., CCC-SLP 09/08/2015, 9:44 AM

## 2015-09-08 NOTE — Op Note (Signed)
[facility] Patient Name: Glenda [name] Procedure Date : 09/08/2015 MRN: [id number] Attending MD: Clarene Essex , MD Date of Birth: 22-Sep-1933 CSN: [id number] Age: 82 Admit Type: Inpatient Procedure:                Upper GI endoscopy Indications:              Dysphagia abnormal barium swallow Providers:                Clarene Essex, MD, Cleda Daub, RN, Corliss Parish,                            Technician Referring MD:              Medicines:                Propofol total dose 130 mg IV0 mg IVlidocaine Complications:            No immediate complications. Estimated Blood Loss:     Estimated blood loss: none. Procedure:                Pre-Anesthesia Assessment:                           - Prior to the procedure, a History and Physical                            was performed, and patient medications and                            allergies were reviewed. The patient's tolerance of                            previous anesthesia was also reviewed. The risks                            and benefits of the procedure and the sedation                            options and risks were discussed with the patient.                            All questions were answered, and informed consent                            was obtained. Prior Anticoagulants: The patient has                            taken aspirin, last dose was 1 day prior to                            procedure. ASA Grade Assessment: II - A patient                            with mild systemic disease. After reviewing the  risks and benefits, the patient was deemed in                            satisfactory condition to undergo the procedure.                           After obtaining informed consent, the endoscope was                            passed under direct vision. Throughout the                            procedure, the patient's blood pressure, pulse, and   oxygen saturations were monitored continuously. The                            [id number]) scope was introduced through the                            mouth, and advanced to the second part of duodenum.                            The upper GI endoscopy was accomplished without                            difficulty. The patient tolerated the procedure                            well. Scope In: Scope Out: Findings:      The larynx was normal.      One moderate benign-appearing, intrinsic stenosis was found. And was       traversed after dilation. A TTS dilator was passed through the scope.       Dilation with a 12-13.5-15 mm balloon dilator was performed to 12 mm.       The dilation site was examined and showed mild improvement in luminal       narrowing. we were unable to advance to scope prior to dilation but       after dilation to 12 mm with very minimal trauma were easily able to       advance the scope multiple times but based on her age elected not to be       any more aggressive at this time      A medium-sized hiatal hernia was present.      A single small semi-sessile polyp was found on the greater curvature of       the stomach. Biopsies were taken with a cold forceps for histology.      The duodenal bulb, first portion of the duodenum and second portion of       the duodenum were normal. except for a probable bulbous ampulla      The exam was otherwise without abnormality.      Patchy very minimal candidiasis was found in the entire esophagus. Impression:               - Normal larynx.                           -  Benign-appearing esophageal stenosis. Dilated.                           - Medium-sized hiatal hernia.                           - A single gastric polyp. Biopsied.                           - Normal duodenal bulb, first portion of the                            duodenum and second portion of the duodenum.                           - The examination was  otherwise normal.                           - Monilial esophagitis. Moderate Sedation:      moderate sedation-none Recommendation:           - Patient has a contact number available for                            emergencies. The signs and symptoms of potential                            delayed complications were discussed with the                            patient. Return to normal activities tomorrow.                            Written discharge instructions were provided to the                            patient.                           - Soft diet today.                           - Continue present medications. okay to resume                            Plavix tomorrow would treat with one week of                            nystatin swish and swallow and once a day pump                            inhibitors long-term                           - Await pathology results.                           -  Return to GI clinic in 1 month.                           - Telephone GI clinic for pathology results in 5                            days.                           - Telephone GI clinic if symptomatic PRN.consider                            more aggressive dilation when necessary and case                            discussed with the hospital team as well Procedure Code(s):        --- Professional ---                           [phone], Esophagogastroduodenoscopy, flexible,                            transoral; with transendoscopic balloon dilation of                            esophagus (less than 30 mm diameter)                           [id number], Esophagogastroduodenoscopy, flexible,                            transoral; with biopsy, single or multiple Diagnosis Code(s):        --- Professional ---                           K22.2, Esophageal obstruction                           K44.9, Diaphragmatic hernia without obstruction or                            gangrene                            K31.7, Polyp of stomach and duodenum                           R13.10, Dysphagia, unspecified CPT copyright 2016 [employer]. All rights reserved. The codes documented in this report are preliminary and upon coder review may  be revised to meet current compliance requirements. Clarene Essex, MD Clarene Essex, MD 09/08/2015 10:44:54 AM This report has been signed electronically. Number of Addenda: 0

## 2015-09-09 ENCOUNTER — Encounter (HOSPITAL_COMMUNITY): Payer: Self-pay | Admitting: Gastroenterology

## 2015-09-09 ENCOUNTER — Ambulatory Visit: Payer: Medicare Other

## 2015-09-09 ENCOUNTER — Ambulatory Visit: Payer: Medicare Other | Admitting: Radiation Oncology

## 2015-09-09 LAB — CBC AND DIFFERENTIAL: WBC: 11.2 10*3/mL

## 2015-09-09 LAB — CBC
HCT: 33.4 % — ABNORMAL LOW (ref 36.0–46.0)
Hemoglobin: 10.8 g/dL — ABNORMAL LOW (ref 12.0–15.0)
MCH: 26.5 pg (ref 26.0–34.0)
MCHC: 32.3 g/dL (ref 30.0–36.0)
MCV: 82.1 fL (ref 78.0–100.0)
PLATELETS: 201 10*3/uL (ref 150–400)
RBC: 4.07 MIL/uL (ref 3.87–5.11)
RDW: 13.5 % (ref 11.5–15.5)
WBC: 11.2 10*3/uL — AB (ref 4.0–10.5)

## 2015-09-09 LAB — GLUCOSE, CAPILLARY
GLUCOSE-CAPILLARY: 155 mg/dL — AB (ref 65–99)
GLUCOSE-CAPILLARY: 184 mg/dL — AB (ref 65–99)
GLUCOSE-CAPILLARY: 212 mg/dL — AB (ref 65–99)

## 2015-09-09 MED ORDER — GI COCKTAIL ~~LOC~~
30.0000 mL | Freq: Once | ORAL | Status: AC
  Administered 2015-09-09: 30 mL via ORAL
  Filled 2015-09-09: qty 30

## 2015-09-09 MED ORDER — NYSTATIN 100000 UNIT/ML MT SUSP: 5.0000 mL | Freq: Four times a day (QID) | OROMUCOSAL | Status: DC

## 2015-09-09 MED ORDER — CLONAZEPAM 0.5 MG PO TABS: 0.2500 mg | ORAL_TABLET | Freq: Three times a day (TID) | ORAL | Status: DC | PRN

## 2015-09-09 NOTE — Discharge Summary (Addendum)
Glenda [name], is a 82 y.o. female  DOB 17-Nov-1933  MRN [id number].  Admission date:  09/04/2015  Admitting Physician  Rise Patience, MD  Discharge Date:  09/09/2015   Primary MD  Donnie Coffin, MD  Recommendations for primary care physician for things to follow:   Monitor secondary to his factors for CVA.  She must follow with stroke team/neurologist within a month and get her stroke medications readjusted.  Check CBC and BMP in 5-7 days   Admission Diagnosis  Nausea & vomiting [R11.2] Acute kidney injury ([place]) [N17.9]   Discharge Diagnosis  Nausea & vomiting [R11.2] Acute kidney injury ([place]) [N17.9]     Principal Problem:   Nausea & vomiting Active Problems:   Essential hypertension   History of breast cancer   Type II diabetes mellitus with neurological manifestations, uncontrolled ([place])   Acute kidney injury ([place])   Cerebral thrombosis with cerebral infarction   Lacunar infarct, acute [facility])      Past Medical History  Diagnosis Date  . GERD (gastroesophageal reflux disease)   . Obesity   . DM (diabetes mellitus) ([place])   . HTN (hypertension)   . HLD (hyperlipidemia)   . CAD (coronary artery disease)   . UTI (urinary tract infection)   . Chest pain   . Dizziness   . Breast cancer ([place])   . Arthritis   . Stroke ([place])   . Shortness of breath dyspnea     Past Surgical History  Procedure Laterality Date  . Cholecystectomy    . Colon polyps; hx    . Coronary angioplasty  02/14/09  . Radioactive seed guided mastectomy with axillary sentinel lymph node biopsy Right 07/17/2015    Procedure: RADIOACTIVE SEED GUIDED PARTIAL MASTECTOMY WITH AXILLARY SENTINEL LYMPH NODE BIOPSY;  Surgeon: Erroll Luna, MD;  Location: [facility];  Service: General;  Laterality: Right;       HPI  from the  history and physical done on the day of admission:    Patient is a 82 y.o. female with recent right cerebellar CVA brought back to the hospital from skilled nursing facility for evaluation of nausea and vomiting. Further evaluation positive for distal esophageal stricture, and MRI brain positive for extension of her right cerebellar infarct, and a new small foci of infarct right lateral thalamus.     Hospital Course:     Nausea & vomiting With dysphagia: Due to high-grade esophageal stricture seen on barium esophagogram. Seen by GI and underwent EGD on 09/08/2015 with stricture dilation by Dr. Daisey Must. EGD showed Candida esophagitis, benign stricture, gastric and internal polyp. Continue PPI, continue nystatin for 7 days per GI. She is this morning symptom-free without any problems, tolerating soft diet, advance as tolerated. We follow with GI in 1-2 weeks.  Coffee-ground emesis/UGI bleed: Doubt active GI bleeding, could have had a small mucosal tear. Seems to have resolved spontaneously. Hemoglobin appears to be stable, continue PPI, EGD as above.  Extension of recent right cerebellar infarct, and now new acute thalamic infarct: Although  could be embolic, suspect likely secondary to dehydration-causing ischemia-due to underlying small vessel disease. Seen by neurology placed on dual antiplatelet therapy, will follow with stroke clinic, avoid sudden Doppler and blood pressure, duration of dual antiplatelet therapy per neurology must follow with them within a month.   Acute kidney injury on chronic kidney disease stage III: Due to dehydration, resolved. Monitor BMP in 5-7 days. Note her Lasix and ACE inhibitor will be resumed monitor BMP closely.  Type 2 diabetes: Continue home 7030 regimen which has been adjusted, can't modify diet, check CBGs before every meal C at bedtime and adjust dose as needed.  Hypertension. Placed on Norvasc and combination of Lasix and ACE inhibitor. Monitor blood  pressure, avoid nodal blocking agents. She had an episode of junctional rhythm on telemetry this admission.  History of breast cancer: Being followed by Dr. Lindi Adie in the outpatient setting-encouraged her to keep her next appointment.  One episode of junctional rhythm on telemetry. Asymptomatic, discontinued Catapres patch & B blcoker, discussed with cardiologist on-call doctor Dr. Aundra Dubin, no further workup. TSH stable.  EKG unremarkable.     Follow UP  Follow-up Information    Follow up with Dennie Bible, NP. Schedule an appointment as soon as possible for a visit in 2 months.   Specialty:  Family Medicine   Why:  stroke clinic   Contact information:   [address] [place] [address] [phone]       Follow up with Donnie Coffin, MD. Schedule an appointment as soon as possible for a visit in 1 week.   Specialty:  Family Medicine   Contact information:   [address] [place] [address] [phone]       Follow up with [facility], MD. Schedule an appointment as soon as possible for a visit in 1 week.   Specialty:  Gastroenterology   Contact information:   [id number] [place] [address] [phone]        Consults obtained - GI, Neuro  Discharge Condition: Fair  Diet and Activity recommendation: See Discharge Instructions below  Discharge Instructions           Discharge Instructions    Discharge instructions    Complete by:  As directed   Follow with Primary MD Donnie Coffin, MD in 7 days   Get CBC, CMP, 2 view Chest X ray checked  by Primary MD next visit.    Activity: As tolerated with Full fall precautions use walker/cane & assistance as needed   Disposition SNF vs HHPT    Diet:  Soft Heart Healthy Low Carb  , with feeding assistance and aspiration precautions.  Accuchecks 4 times/day, Once in AM empty stomach and then before each meal. Log in all results and show them to your  Prim.MD in 3 days. If any glucose reading is under 80 or above 300 call your Prim MD immidiately. Follow Low glucose instructions for glucose under 80 as instructed.   For Heart failure patients - Check your Weight same time everyday, if you gain over 2 pounds, or you develop in leg swelling, experience more shortness of breath or chest pain, call your Primary MD immediately. Follow Cardiac Low Salt Diet and 1.5 lit/day fluid restriction.   On your next visit with your primary care physician please Get Medicines reviewed and adjusted.   Please request your Prim.MD to go over all Hospital Tests and Procedure/Radiological results at the follow up, please get  all Hospital records sent to your Prim MD by signing hospital release before you go home.   If you experience worsening of your admission symptoms, develop shortness of breath, life threatening emergency, suicidal or homicidal thoughts you must seek medical attention immediately by calling 911 or calling your MD immediately  if symptoms less severe.  You Must read complete instructions/literature along with all the possible adverse reactions/side effects for all the Medicines you take and that have been prescribed to you. Take any new Medicines after you have completely understood and accpet all the possible adverse reactions/side effects.   Do not drive, operating heavy machinery, perform activities at heights, swimming or participation in water activities or provide baby sitting services if your were admitted for syncope or siezures until you have seen by Primary MD or a Neurologist and advised to do so again.  Do not drive when taking Pain medications.    Do not take more than prescribed Pain, Sleep and Anxiety Medications  Special Instructions: If you have smoked or chewed Tobacco  in the last 2 yrs please stop smoking, stop any regular Alcohol  and or any Recreational drug use.  Wear Seat belts while driving.   Please note  You  were cared for by a hospitalist during your hospital stay. If you have any questions about your discharge medications or the care you received while you were in the hospital after you are discharged, you can call the unit and asked to speak with the hospitalist on call if the hospitalist that took care of you is not available. Once you are discharged, your primary care physician will handle any further medical issues. Please note that NO REFILLS for any discharge medications will be authorized once you are discharged, as it is imperative that you return to your primary care physician (or establish a relationship with a primary care physician if you do not have one) for your aftercare needs so that they can reassess your need for medications and monitor your lab values.     Increase activity slowly    Complete by:  As directed              Discharge Medications       Medication List    STOP taking these medications        cloNIDine 0.2 MG tablet  Commonly known as:  CATAPRES     omeprazole 20 MG capsule  Commonly known as:  PRILOSEC     valsartan-hydrochlorothiazide 320-25 MG tablet  Commonly known as:  DIOVAN-HCT      TAKE these medications        amLODipine 10 MG tablet  Commonly known as:  NORVASC  Take 10 mg by mouth daily.     aspirin 81 MG tablet  Take 81 mg by mouth daily.     clonazePAM 0.5 MG tablet  Commonly known as:  KLONOPIN  Take 0.5 tablets (0.25 mg total) by mouth 3 (three) times daily as needed for anxiety.     clopidogrel 75 MG tablet  Commonly known as:  PLAVIX  Take 75 mg by mouth daily.     furosemide 80 MG tablet  Commonly known as:  LASIX  Take 80 mg by mouth daily.     HUMALOG KWIKPEN 100 UNIT/ML KiwkPen  Generic drug:  insulin lispro  Inject 22-34 Units into the skin 2 (two) times daily. Sliding scale as follows: 80-199 = 22 units 200-299 = 26 units 300-399 = 30 units >400 =  34 units     lisinopril 20 MG tablet  Commonly known as:   PRINIVIL,ZESTRIL  Take 1 tablet (20 mg total) by mouth daily.     nystatin 100000 UNIT/ML suspension  Commonly known as:  MYCOSTATIN  Take 5 mLs (500,000 Units total) by mouth 4 (four) times daily. For 7 more days     pantoprazole 40 MG tablet  Commonly known as:  PROTONIX  Take 40 mg by mouth 2 (two) times daily.     potassium chloride 10 MEQ CR tablet  Commonly known as:  KLOR-CON  Take 10 mEq by mouth daily.     rosuvastatin 5 MG tablet  Commonly known as:  CRESTOR  Take 1 tablet (5 mg total) by mouth daily.     TOUJEO SOLOSTAR 300 UNIT/ML Sopn  Generic drug:  Insulin Glargine  Inject 30 Units into the skin every morning.        Major procedures and Radiology Reports - PLEASE review detailed and final reports for all details, in brief -    EGD Done 09/08/2015 by Dr. Driscilla Grammes. Stricture was dilated. - kindly full report   Ct Head Wo Contrast  09/05/2015  CLINICAL DATA:  Vomiting. EXAM: CT HEAD WITHOUT CONTRAST TECHNIQUE: Contiguous axial images were obtained from the base of the skull through the vertex without intravenous contrast. COMPARISON:  08/24/2015 FINDINGS: Focal hypodensity in the right middle cerebellar peduncle directly lateral to the fourth ventricle corresponds to the area of infarction observed on 08/24/2015. This may represent edema. This appears slightly larger than the area of infarction observed on 08/24/2015, and possibility of extension of the infarct is a consideration. There is no hemorrhagic transformation. There is no intracranial hemorrhage. There is no extra-axial fluid collection. There is mild generalized atrophy. There is white matter hypodensity which is chronic and likely due to small vessel disease. Hypodensities in the left pons may represent remote lacunar infarctions. There also is remote lacunar infarction in the left internal capsule posterior limb. No bony abnormalities. The visible paranasal sinuses and orbits are unremarkable. IMPRESSION:  Focal hypodensity at the right lateral aspect of the fourth ventricle, corresponding to the location of the right cerebellar peduncle infarction observed on 08/24/2015 and likely representing edema. This could represent extension of that prior infarction. No hemorrhage. No other significant interval change. There is mild generalized atrophy and chronic white matter hypodensity which likely represents small vessel ischemic disease. MRI will be the modality of choice if additional imaging is clinically warranted. Electronically Signed   By: Andreas Newport M.D.   On: 09/05/2015 00:54   Mr Jodene Nam Head Wo Contrast  08/25/2015  CLINICAL DATA:  Continued surveillance of acute posterior circulation infarct. EXAM: MRA NECK WITHOUT AND WITH CONTRAST MRA HEAD WITHOUT CONTRAST TECHNIQUE: Multiplanar and multiecho pulse sequences of the neck were obtained without and with intravenous contrast. Angiographic images of the neck were obtained using MRA technique without and with intravenous contrast.; Angiographic images of the Circle of Willis were obtained using MRA technique without intravenous contrast. CONTRAST:  54mL MULTIHANCE GADOBENATE DIMEGLUMINE 529 MG/ML IV SOLN COMPARISON:  MR brain 08/24/2015. FINDINGS: MRA NECK FINDINGS Dolichoectasia proximal vasculature. Conventional branching of the great vessels from the arch. The proximal RIGHT common carotid artery demonstrates a estimated 75% stenosis. No flow-limiting disease at the bifurcation or throughout the course of the visualized RIGHT cervical ICA. Poorly visualized proximal LEFT common carotid artery without definite proximal narrowing. No significant atheromatous change at the bifurcation. Cervical ICA widely patent on  the LEFT. Both vertebral arteries are tortuous. They are equal in size. No definite ostial lesion. There may be a 75% stenosis of the proximal RIGHT vertebral 2 cm above its origin. MRA HEAD FINDINGS Mild non stenotic irregularity of the cavernous and  supraclinoid ICA segments on the RIGHT. 50% smooth stenosis of the cavernous and supraclinoid ICA segments on the LEFT. No proximal stenosis of the anterior or middle cerebral arteries. No MCA branch occlusion. No visible intracranial aneurysm. Basilar artery is widely patent. Both vertebrals contribute equally to its formation. There is no significant proximal PCA disease or cerebellar branch occlusion. IMPRESSION: 50% stenosis of the cavernous and supraclinoid ICA segments on the LEFT, non flow reducing. No significant intracranial posterior circulation disease. No extracranial carotid bifurcation or cervical ICA lesion. Proximal RIGHT common carotid artery and proximal RIGHT vertebral artery as described above. Electronically Signed   By: Staci Righter M.D.   On: 08/25/2015 09:06   Mr Angiogram Neck W Wo Contrast  08/25/2015  CLINICAL DATA:  Continued surveillance of acute posterior circulation infarct. EXAM: MRA NECK WITHOUT AND WITH CONTRAST MRA HEAD WITHOUT CONTRAST TECHNIQUE: Multiplanar and multiecho pulse sequences of the neck were obtained without and with intravenous contrast. Angiographic images of the neck were obtained using MRA technique without and with intravenous contrast.; Angiographic images of the Circle of Willis were obtained using MRA technique without intravenous contrast. CONTRAST:  74mL MULTIHANCE GADOBENATE DIMEGLUMINE 529 MG/ML IV SOLN COMPARISON:  MR brain 08/24/2015. FINDINGS: MRA NECK FINDINGS Dolichoectasia proximal vasculature. Conventional branching of the great vessels from the arch. The proximal RIGHT common carotid artery demonstrates a estimated 75% stenosis. No flow-limiting disease at the bifurcation or throughout the course of the visualized RIGHT cervical ICA. Poorly visualized proximal LEFT common carotid artery without definite proximal narrowing. No significant atheromatous change at the bifurcation. Cervical ICA widely patent on the LEFT. Both vertebral arteries are  tortuous. They are equal in size. No definite ostial lesion. There may be a 75% stenosis of the proximal RIGHT vertebral 2 cm above its origin. MRA HEAD FINDINGS Mild non stenotic irregularity of the cavernous and supraclinoid ICA segments on the RIGHT. 50% smooth stenosis of the cavernous and supraclinoid ICA segments on the LEFT. No proximal stenosis of the anterior or middle cerebral arteries. No MCA branch occlusion. No visible intracranial aneurysm. Basilar artery is widely patent. Both vertebrals contribute equally to its formation. There is no significant proximal PCA disease or cerebellar branch occlusion. IMPRESSION: 50% stenosis of the cavernous and supraclinoid ICA segments on the LEFT, non flow reducing. No significant intracranial posterior circulation disease. No extracranial carotid bifurcation or cervical ICA lesion. Proximal RIGHT common carotid artery and proximal RIGHT vertebral artery as described above. Electronically Signed   By: Staci Righter M.D.   On: 08/25/2015 09:06   Mr Brain Wo Contrast  09/05/2015  CLINICAL DATA:  82 year old female with small right cerebellar infarct in April. Persistent nausea vomiting, recurrent dizziness. Initial encounter. EXAM: MRI HEAD WITHOUT CONTRAST TECHNIQUE: Multiplanar, multiecho pulse sequences of the brain and surrounding structures were obtained without intravenous contrast. COMPARISON:  Head CT without contrast midnight today. Brain MRI 08/24/2015 and earlier. FINDINGS: Major intracranial vascular flow voids are stable. There has been enlargement of the restricted diffusion along the right lateral aspect of the fourth ventricle in the right cerebellar hemisphere since April. The area now encompasses 9-10 mm, versus 3 mm previously. See series 5, image 14 and compare to series 4, image 15 previously. No other  brainstem or cerebellar restricted diffusion. However, there is new curvilinear restricted diffusion in the lateral right thalamus near the  posterior limb of the right internal capsule. Diffusion imaging elsewhere is stable. Mild T2 and FLAIR corresponding to the acute diffusion findings with no mass effect. There does appear to be a new punctate micro hemorrhage along the caudal aspect of the right thalamic diffusion abnormality as seen on series 9, image 45. There is underlying advanced chronic small vessel disease in the brainstem and thalami I with multiple bilateral chronic lacunar infarcts. The left midbrain also is chronically affected. Aside from the acute findings the cerebellum is relatively spared. Patchy supratentorial white matter T2 and FLAIR hyperintensity is stable with no supratentorial cortical encephalomalacia. Scattered chronic micro hemorrhages are re- demonstrated in the posterior left hemisphere and brainstem. No midline shift, mass effect, evidence of mass lesion, ventriculomegaly, or other acute intracranial hemorrhage. Cervicomedullary junction and pituitary are within normal limits. Stable visualized cervical spine. Bone marrow signal is stable and within normal limits. Stable visualized internal auditory structures. Mastoids are clear. Trace paranasal sinus mucosal thickening is stable. Orbit and scalp soft tissues are stable. IMPRESSION: 1. Mild acute extension of the lacunar type right cerebellar infarct along the lateral wall of the fourth ventricle. No associated hemorrhage or mass effect. 2. Superimposed acute lacunar infarct in the lateral right thalamus with associated microhemorrhage. No mass effect. 3. Otherwise stable advanced chronic small vessel disease in the brainstem and thalami. Electronically Signed   By: Genevie Ann M.D.   On: 09/05/2015 11:19   Mr Brain Wo Contrast  08/24/2015  CLINICAL DATA:  Ataxia with nausea and vomiting since yesterday. Assessment for stroke. EXAM: MRI HEAD WITHOUT CONTRAST TECHNIQUE: Multiplanar, multiecho pulse sequences of the brain and surrounding structures were obtained without  intravenous contrast. COMPARISON:  07/04/2014 FINDINGS: There is a 4 mm acute infarct in the medial aspect of the right middle cerebellar peduncle. No intracranial hemorrhage, mass, midline shift, or extra-axial fluid collection is identified. Small chronic infarcts are seen in the pons and thalami. A chronic infarct in the posterior limb of the internal capsule was acute on the prior MRI. There is new wallerian degeneration along the left corticospinal tract in the cerebral peduncle. There is mild cerebral atrophy which is unchanged. Foci of T2 hyperintensity throughout the cerebral white matter and pons are similar to the prior MRI and nonspecific but compatible with moderate chronic small vessel ischemic disease. Prior bilateral cataract extraction is noted. There is a trace right mastoid effusion, and there is minimal left anterior ethmoid air cell mucosal thickening. Major intracranial vascular flow voids are preserved. IMPRESSION: 1. Punctate acute infarct in the right middle cerebellar peduncle. 2. Moderate chronic small vessel ischemic changes as above. Electronically Signed   By: Logan Bores M.D.   On: 08/24/2015 13:18   Dg Esophagus  09/05/2015  CLINICAL DATA:  82 year old with recent stroke and chronic intermittent dysphagia, primarily for solids and pills. EXAM: ESOPHOGRAM/BARIUM SWALLOW TECHNIQUE: Single contrast examination was performed using  thin barium. FLUOROSCOPY TIME:  Radiation Exposure Index (as provided by the fluoroscopic device): 96.5 uGy*m2 If the device does not provide the exposure index: Fluoroscopy Time:  1 minutes. Number of Acquired Images: 1 spot image. Remainder of study obtained with fluoro store. COMPARISON:  None. FINDINGS: Limited study was performed with the patient in a semi erect position. Patient swallowed the contrast without difficulty. No aspiration was observed. There is moderate esophageal dysmotility with a decreased primary stripping wave  and diffuse tertiary  contractions. There is fixed luminal narrowing of the distal esophagus extending over approximately 1.4 cm. No definite mucosal ulceration is seen in this area. Some contrast did pass into the stomach. A 13 mm barium tablet was administered. This passed into the distal esophagus, but not through the distal esophageal stricture. IMPRESSION: 1. High-grade distal esophageal stricture. This is indeterminate in etiology and could reflect a benign stricture from chronic reflux. Endoscopic evaluation recommended to exclude malignancy. 2. Moderate esophageal dysmotility. Electronically Signed   By: Richardean Sale M.D.   On: 09/05/2015 12:03   Dg Abd Acute W/chest  09/04/2015  CLINICAL DATA:  Coffee-ground emesis and melena beginning today. EXAM: DG ABDOMEN ACUTE W/ 1V CHEST COMPARISON:  08/24/2015 FINDINGS: There is no evidence of dilated bowel loops or free intraperitoneal air. No radiopaque calculi identified. Extensive vascular calcification noted within the abdomen pelvis. Severe lumbar spine degenerative changes also demonstrated. Heart size and mediastinal contours are within normal limits. Low lung volumes noted, however both lungs are clear. No evidence of pneumothorax or pleural effusion. IMPRESSION: Nonspecific, nonobstructive bowel gas pattern. No active cardiopulmonary disease. Electronically Signed   By: Earle Gell M.D.   On: 09/04/2015 23:47   Dg Abd Portable 1v  08/24/2015  CLINICAL DATA:  Nausea vomiting and vertigo, 2 days duration. EXAM: PORTABLE ABDOMEN - 1 VIEW COMPARISON:  None. FINDINGS: The bowel gas pattern is normal. No radio-opaque calculi or other significant radiographic abnormality are seen. IMPRESSION: Negative. Electronically Signed   By: Andreas Newport M.D.   On: 08/24/2015 20:11    Micro Results      Recent Results (from the past 240 hour(s))  MRSA PCR Screening     Status: None   Collection Time: 09/05/15  1:44 AM  Result Value Ref Range Status   MRSA by PCR NEGATIVE  NEGATIVE Final    Comment:        The GeneXpert MRSA Assay (FDA approved for NASAL specimens only), is one component of a comprehensive MRSA colonization surveillance program. It is not intended to diagnose MRSA infection nor to guide or monitor treatment for MRSA infections.        Today   Subjective    Glenda [name] today has no headache,no chest abdominal pain,no new weakness tingling or numbness, feels much better.   Objective   Blood pressure 165/58, pulse 66, temperature 98.3 F (36.8 C), temperature source Oral, resp. rate 18, height 5\' 9"  (1.753 m), weight 65 kg (143 lb 4.8 oz), SpO2 98 %.   Intake/Output Summary (Last 24 hours) at 09/09/15 0949 Last data filed at 09/08/15 1032  Gross per 24 hour  Intake    200 ml  Output      0 ml  Net    200 ml    Exam Awake Alert, Oriented x 3, No new F.N deficits, Normal affect [place].AT,PERRAL Supple Neck,No JVD, No cervical lymphadenopathy appriciated.  Symmetrical Chest wall movement, Good air movement bilaterally, CTAB RRR,No Gallops,Rubs or new Murmurs, No Parasternal Heave +ve B.Sounds, Abd Soft, Non tender, No organomegaly appriciated, No rebound -guarding or rigidity. No Cyanosis, Clubbing or edema, No new Rash or bruise   Data Review   CBC w Diff:  Lab Results  Component Value Date   WBC 11.2* 09/09/2015   WBC 5.3 05/07/2015   HGB 10.8* 09/09/2015   HGB 11.0* 05/07/2015   HCT 33.4* 09/09/2015   HCT 34.0* 05/07/2015   PLT 201 09/09/2015   PLT 207 05/07/2015  LYMPHOPCT 44 05/22/2015   LYMPHOPCT 33.0 05/07/2015   MONOPCT 10 05/22/2015   MONOPCT 9.8 05/07/2015   EOSPCT 2 05/22/2015   EOSPCT 0.8 05/07/2015   BASOPCT 1 05/22/2015   BASOPCT 0.4 05/07/2015    CMP:  Lab Results  Component Value Date   NA 146* 09/06/2015   NA 141 05/07/2015   K 4.4 09/08/2015   K 4.3 05/07/2015   CL 111 09/06/2015   CO2 24 09/06/2015   CO2 22 05/07/2015   BUN 39* 09/06/2015   BUN 22.5 05/07/2015   CREATININE  1.42* 09/06/2015   CREATININE 1.2* 05/07/2015   PROT 6.0* 09/05/2015   PROT 7.1 05/07/2015   ALBUMIN 2.8* 09/05/2015   ALBUMIN 3.3* 05/07/2015   BILITOT 0.6 09/05/2015   BILITOT 0.30 05/07/2015   ALKPHOS 88 09/05/2015   ALKPHOS 95 05/07/2015   AST 17 09/05/2015   AST 15 05/07/2015   ALT 10* 09/05/2015   ALT <9 05/07/2015  .   Total Time in preparing paper work, data evaluation and todays exam - 35 minutes  Thurnell Lose M.D on 09/09/2015 at 9:49 AM  [facility]   Office  [phone]

## 2015-09-09 NOTE — Discharge Instructions (Signed)
Follow with Primary MD Donnie Coffin, MD in 7 days   Get CBC, CMP, 2 view Chest X ray checked  by Primary MD next visit.    Activity: As tolerated with Full fall precautions use walker/cane & assistance as needed   Disposition SNF vs HHPT    Diet:  Soft Heart Healthy Low Carb  , with feeding assistance and aspiration precautions.  Accuchecks 4 times/day, Once in AM empty stomach and then before each meal. Log in all results and show them to your Prim.MD in 3 days. If any glucose reading is under 80 or above 300 call your Prim MD immidiately. Follow Low glucose instructions for glucose under 80 as instructed.  For Heart failure patients - Check your Weight same time everyday, if you gain over 2 pounds, or you develop in leg swelling, experience more shortness of breath or chest pain, call your Primary MD immediately. Follow Cardiac Low Salt Diet and 1.5 lit/day fluid restriction.   On your next visit with your primary care physician please Get Medicines reviewed and adjusted.   Please request your Prim.MD to go over all Hospital Tests and Procedure/Radiological results at the follow up, please get all Hospital records sent to your Prim MD by signing hospital release before you go home.   If you experience worsening of your admission symptoms, develop shortness of breath, life threatening emergency, suicidal or homicidal thoughts you must seek medical attention immediately by calling 911 or calling your MD immediately  if symptoms less severe.  You Must read complete instructions/literature along with all the possible adverse reactions/side effects for all the Medicines you take and that have been prescribed to you. Take any new Medicines after you have completely understood and accpet all the possible adverse reactions/side effects.   Do not drive, operating heavy machinery, perform activities at heights, swimming or participation in water activities or provide baby sitting services if  your were admitted for syncope or siezures until you have seen by Primary MD or a Neurologist and advised to do so again.  Do not drive when taking Pain medications.    Do not take more than prescribed Pain, Sleep and Anxiety Medications  Special Instructions: If you have smoked or chewed Tobacco  in the last 2 yrs please stop smoking, stop any regular Alcohol  and or any Recreational drug use.  Wear Seat belts while driving.   Please note  You were cared for by a hospitalist during your hospital stay. If you have any questions about your discharge medications or the care you received while you were in the hospital after you are discharged, you can call the unit and asked to speak with the hospitalist on call if the hospitalist that took care of you is not available. Once you are discharged, your primary care physician will handle any further medical issues. Please note that NO REFILLS for any discharge medications will be authorized once you are discharged, as it is imperative that you return to your primary care physician (or establish a relationship with a primary care physician if you do not have one) for your aftercare needs so that they can reassess your need for medications and monitor your lab values.

## 2015-09-09 NOTE — Progress Notes (Signed)
Glenda [name] to be D/C'd to skilled nursing facility per MD order.  Discussed with the patient and all questions fully answered.  VSS, Skin clean, dry and intact without evidence of skin break down, no evidence of skin tears noted. IV catheter discontinued intact. Site without signs and symptoms of complications. Dressing and pressure applied.  Prescriptions and patient information sent with PTAR to SNF.  D/c education completed with patient/family including follow up instructions, medication list, d/c activities limitations if indicated, with other d/c instructions as indicated by MD - patient able to verbalize understanding, all questions fully answered.   Patient instructed to return to ED, call 911, or call MD for any changes in condition.   Patient escorted via stretcher, and D/C to SNF via PTAR.  [name] 09/09/2015 11:47 AM

## 2015-09-09 NOTE — Progress Notes (Signed)
Patient will discharge to [facility] Anticipated discharge date: 5/16 Family notified: dtrs at bedside Transportation by [facility]- scheduled for 11am  CSW signing off.  Domenica Reamer, [place] Social Worker [phone]

## 2015-09-10 ENCOUNTER — Other Ambulatory Visit: Payer: Self-pay | Admitting: Physician Assistant

## 2015-09-10 ENCOUNTER — Ambulatory Visit: Payer: Medicare Other | Admitting: Radiation Oncology

## 2015-09-10 ENCOUNTER — Telehealth: Payer: Self-pay | Admitting: Oncology

## 2015-09-10 ENCOUNTER — Ambulatory Visit: Payer: Medicare Other

## 2015-09-10 DIAGNOSIS — I639 Cerebral infarction, unspecified: Secondary | ICD-10-CM

## 2015-09-10 NOTE — Telephone Encounter (Signed)
Called Glenda [name] who said that Glenda [name] is out of the hospital and is now at a rehab facility for one and half weeks..  She is going to check with her sister, Glenda [name] about starting radiation tomorrow.  Glenda [name] said that Glenda [name]'s only limitation at this time is walking due to weakness in her right leg.   Also left a message for [facility] requesting a return call.

## 2015-09-11 ENCOUNTER — Ambulatory Visit: Payer: Medicare Other

## 2015-09-11 ENCOUNTER — Ambulatory Visit
Admission: RE | Admit: 2015-09-11 | Discharge: 2015-09-11 | Disposition: A | Discharge status: Home / Self Care | Payer: Medicare Other | Source: Ambulatory Visit | Attending: Radiation Oncology | Admitting: Radiation Oncology

## 2015-09-11 DIAGNOSIS — C50411 Malignant neoplasm of upper-outer quadrant of right female breast: Secondary | ICD-10-CM

## 2015-09-11 DIAGNOSIS — Z510 Encounter for antineoplastic radiation therapy: Secondary | ICD-10-CM | POA: Diagnosis not present

## 2015-09-11 NOTE — Progress Notes (Signed)
  Radiation Oncology         [phone]) [phone] ________________________________  Name: Glenda [name] MRN: [id number]  Date: 09/11/2015  DOB: 10/13/1933  Simulation Verification Note    ICD-9-CM ICD-10-CM   1. Breast cancer of upper-outer quadrant of right female breast ([place]) 174.4 C50.411     Status: outpatient  NARRATIVE: The patient was brought to the treatment unit and placed in the planned treatment position. The clinical setup was verified. Then port films were obtained and uploaded to the radiation oncology medical record software.  The treatment beams were carefully compared against the planned radiation fields. The position location and shape of the radiation fields was reviewed. They targeted volume of tissue appears to be appropriately covered by the radiation beams. Organs at risk appear to be excluded as planned.  Based on my personal review, I approved the simulation verification. The patient's treatment will proceed as planned.  -----------------------------------  Blair Promise, PhD, MD

## 2015-09-12 ENCOUNTER — Ambulatory Visit: Payer: Medicare Other

## 2015-09-12 ENCOUNTER — Ambulatory Visit: Payer: Medicare Other | Admitting: Radiation Oncology

## 2015-09-12 ENCOUNTER — Non-Acute Institutional Stay (SKILLED_NURSING_FACILITY): Payer: Medicare Other | Admitting: Adult Health

## 2015-09-12 ENCOUNTER — Encounter: Payer: Self-pay | Admitting: Adult Health

## 2015-09-12 ENCOUNTER — Ambulatory Visit
Admission: RE | Admit: 2015-09-12 | Discharge: 2015-09-12 | Disposition: A | Discharge status: Home / Self Care | Payer: Medicare Other | Source: Ambulatory Visit | Attending: Radiation Oncology | Admitting: Radiation Oncology

## 2015-09-12 DIAGNOSIS — I10 Essential (primary) hypertension: Secondary | ICD-10-CM

## 2015-09-12 DIAGNOSIS — E1165 Type 2 diabetes mellitus with hyperglycemia: Secondary | ICD-10-CM

## 2015-09-12 DIAGNOSIS — C50411 Malignant neoplasm of upper-outer quadrant of right female breast: Secondary | ICD-10-CM

## 2015-09-12 DIAGNOSIS — R112 Nausea with vomiting, unspecified: Secondary | ICD-10-CM | POA: Diagnosis not present

## 2015-09-12 DIAGNOSIS — Z794 Long term (current) use of insulin: Secondary | ICD-10-CM | POA: Diagnosis not present

## 2015-09-12 DIAGNOSIS — I639 Cerebral infarction, unspecified: Secondary | ICD-10-CM

## 2015-09-12 DIAGNOSIS — IMO0002 Reserved for concepts with insufficient information to code with codable children: Secondary | ICD-10-CM

## 2015-09-12 DIAGNOSIS — Z510 Encounter for antineoplastic radiation therapy: Secondary | ICD-10-CM | POA: Diagnosis not present

## 2015-09-12 DIAGNOSIS — K2100 Gastro-esophageal reflux disease with esophagitis, without bleeding: Secondary | ICD-10-CM

## 2015-09-12 DIAGNOSIS — R600 Localized edema: Secondary | ICD-10-CM

## 2015-09-12 DIAGNOSIS — E1149 Type 2 diabetes mellitus with other diabetic neurological complication: Secondary | ICD-10-CM | POA: Diagnosis not present

## 2015-09-12 MED ORDER — RADIAPLEXRX EX GEL
Freq: Once | CUTANEOUS | Status: AC
  Administered 2015-09-12: 10:00:00 via TOPICAL

## 2015-09-12 MED ORDER — ALRA NON-METALLIC DEODORANT (RAD-ONC)
1.0000 "application " | Freq: Once | TOPICAL | Status: AC
  Administered 2015-09-12: 1 via TOPICAL

## 2015-09-12 NOTE — Progress Notes (Signed)
Pt here for patient teaching.  Pt given Radiation and You booklet, skin care instructions, Alra deodorant and Radiaplex gel. Pt reports they have not watched the Radiation Therapy Education video and has been given the link to watch at home. Reviewed areas of pertinence such as fatigue, skin changes, breast tenderness and breast swelling . Pt able to give teach back of to pat skin and use unscented/gentle soap,apply Radiaplex bid and avoid applying anything to skin within 4 hours of treatment. Pt demonstrated understanding and verbalizes understanding of information given and will contact nursing with any questions or concerns.          

## 2015-09-12 NOTE — Progress Notes (Signed)
Location:  [facility] Room Number: [id number] P Place of Service:  SNF (31)   CODE STATUS: full code  No Known Allergies  Chief Complaint  Patient presents with  . Hospitalization Follow-up    HPI:  She has been hospitalized again for her nausea/vomting and an extension of her cva. She did have an EGD done with an esophageal dilation performed. She does have thrush and is receiving nystatin solution. She tells me that she is feeling much better. She does have some nausea and would like to have something that she can take prior to therapy. zofran does make her too sleepy to participate in therapy. She is here for short term rehab with her goal to return back home.    Past Medical History  Diagnosis Date  . GERD (gastroesophageal reflux disease)   . Obesity   . DM (diabetes mellitus) ([place])   . HTN (hypertension)   . HLD (hyperlipidemia)   . CAD (coronary artery disease)   . UTI (urinary tract infection)   . Chest pain   . Dizziness   . Breast cancer ([place])   . Arthritis   . Stroke ([place])   . Shortness of breath dyspnea     Past Surgical History  Procedure Laterality Date  . Cholecystectomy    . Colon polyps; hx    . Coronary angioplasty  02/14/09  . Radioactive seed guided mastectomy with axillary sentinel lymph node biopsy Right 07/17/2015    Procedure: RADIOACTIVE SEED GUIDED PARTIAL MASTECTOMY WITH AXILLARY SENTINEL LYMPH NODE BIOPSY;  Surgeon: Erroll Luna, MD;  Location: [facility];  Service: General;  Laterality: Right;  . Esophagogastroduodenoscopy (egd) with propofol Left 09/08/2015    Procedure: ESOPHAGOGASTRODUODENOSCOPY (EGD) WITH PROPOFOL;  Surgeon: Clarene Essex, MD;  Location: [facility] ENDOSCOPY;  Service: Endoscopy;  Laterality: Left;    Social History   Social History  . Marital Status: Widowed    Spouse Name: N/A  . Number of Children: 5  . Years of Education: N/A   Occupational History  . Not on file.   Social History Main  Topics  . Smoking status: Never Smoker   . Smokeless tobacco: Never Used  . Alcohol Use: No  . Drug Use: No  . Sexual Activity: Not Currently   Other Topics Concern  . Not on file   Social History Narrative   Retired.    Family History  Problem Relation Age of Onset  . Diabetes Mother   . Colon cancer Father     also had lung  . Diabetes    . Heart attack    . Diabetes Sister     [name]  . Breast cancer Sister       VITAL SIGNS BP 167/68 mmHg  Pulse 69  Temp(Src) 98.1 F (36.7 C) (Oral)  Resp 18  Ht 5\' 9"  (1.753 m)  Wt 143 lb (64.864 kg)  BMI 21.11 kg/m2  SpO2 97%  Patient's Medications  New Prescriptions   No medications on file  Previous Medications   AMLODIPINE (NORVASC) 10 MG TABLET    Take 10 mg by mouth daily.   ASPIRIN 81 MG TABLET    Take 81 mg by mouth daily.   CLONAZEPAM (KLONOPIN) 0.5 MG TABLET    Take 0.5 tablets (0.25 mg total) by mouth 3 (three) times daily as needed for anxiety.   CLOPIDOGREL (PLAVIX) 75 MG TABLET    Take 75 mg by mouth daily.   FUROSEMIDE (LASIX)  80 MG TABLET    Take 80 mg by mouth daily.   HUMALOG KWIKPEN 100 UNIT/ML KIWKPEN    Inject 22-34 Units into the skin 2 (two) times daily. Sliding scale as follows: 80-199 = 22 units 200-299 = 26 units 300-399 = 30 units >400 = 34 units   LISINOPRIL (PRINIVIL,ZESTRIL) 20 MG TABLET    Take 1 tablet (20 mg total) by mouth daily.   NYSTATIN (MYCOSTATIN) 100000 UNIT/ML SUSPENSION    Take 5 mLs (500,000 Units total) by mouth 4 (four) times daily. For 7 more days   ONDANSETRON (ZOFRAN) 4 MG TABLET    Take 4 mg by mouth every 6 (six) hours as needed for nausea or vomiting.   PANTOPRAZOLE (PROTONIX) 40 MG TABLET    Take 40 mg by mouth 2 (two) times daily.   POTASSIUM CHLORIDE (KLOR-CON) 10 MEQ CR TABLET    Take 10 mEq by mouth daily.     ROSUVASTATIN (CRESTOR) 5 MG TABLET    Take 1 tablet (5 mg total) by mouth daily.   TOUJEO SOLOSTAR 300 UNIT/ML SOPN    Inject 30 Units into the skin every  morning.   Modified Medications   No medications on file  Discontinued Medications   HYALURONATE SODIUM (RADIAPLEXRX) GEL    Apply 1 application topically 2 (two) times daily.   NON-METALLIC DEODORANT (ALRA) MISC    Apply 1 application topically daily as needed.     SIGNIFICANT DIAGNOSTIC EXAMS   08-24-15: mri of brain: 1. Punctate acute infarct in the right middle cerebellar peduncle. 2. Moderate chronic small vessel ischemic changes as above.   08-24-15: kub: The bowel gas pattern is normal. No radio-opaque calculi or other significant radiographic abnormality are seen.  08-25-15: mra of head and neck: 50% stenosis of the cavernous and supraclinoid ICA segments on the LEFT, non flow reducing. No significant intracranial posterior circulation disease. No extracranial carotid bifurcation or cervical ICA lesion. Proximal RIGHT common carotid artery and proximal RIGHT vertebral artery as described above.  08-26-15: TEE: Left ventricle: The cavity size was normal. There was mild concentric hypertrophy. Systolic function was normal. The estimated ejection fraction was in the range of 60% to 65%. There was dynamic obstruction at restin the mid cavity. Wall motion was normal; there were no regional wall motion abnormalities. Doppler parameters are consistent with abnormal left ventricular relaxation (grade 1 diastolic dysfunction). Doppler parameters are consistent with high ventricular filling pressure. - Aortic valve: Transvalvular velocity was within the normal range. There was no stenosis. There was mild regurgitation. - Mitral valve: There was trivial regurgitation. - Right ventricle: The cavity size was normal. Wall thickness was normal. Systolic function was normal. - Tricuspid valve: There was mild regurgitation. - Pulmonary arteries: Systolic pressure was mildly increased. PA peak pressure: 39 mm Hg (S).  09-04-15: acute abdomen with chest x-ray: Nonspecific, nonobstructive bowel gas  pattern. No active cardiopulmonary disease.  09-05-15: ct of head: Focal hypodensity at the right lateral aspect of the fourth ventricle, corresponding to the location of the right cerebellar peduncle infarction observed on 08/24/2015 and likely representing edema. This could represent extension of that prior infarction. No hemorrhage. No other significant interval change. There is mild generalized atrophy and chronic white matter hypodensity which likely represents small vessel ischemic disease. MRI will be the modality of choice if additional imaging is clinically warranted.  09-05-15: mri of brain; 1. Mild acute extension of the lacunar type right cerebellar infarct along the lateral wall of the fourth  ventricle. No associated hemorrhage or mass effect. 2. Superimposed acute lacunar infarct in the lateral right thalamus with associated microhemorrhage. No mass effect. 3. Otherwise stable advanced chronic small vessel disease in the brainstem and thalami.  09-05-15; barium swallow: 1. High-grade distal esophageal stricture. This is indeterminate in etiology and could reflect a benign stricture from chronic reflux. Endoscopic evaluation recommended to exclude malignancy. 2. Moderate esophageal dysmotility.     LABS REVIEWED;   08-24-15: wbc 5.6; hgb 11.5 hct 35.6; mcv 81.7; plt 241; glucose 241; bun 28; creat 1.42; k+ 3.9; na++ 145; liver normal albumin 3.2  hgb a1c 9.9  08-25-15: chol 154; ldl 70; trig 168; hdl 47; hgb a1c 10.2 08-28-15: glucose 144; bun 31; creat 1.62; k+ 3.4; na++142; phos 4.5; albumin 3.0  09-04-15; wbc 8.3; hgb 12.2; hct 37.6; mcv 81.0; plt 279; glucose 256; bun 73; creat 2.68; k+ 4.8; na++139; liver normal albumin 3.6 guaiac neg 09-07-15: tsn 0.991 09-09-15: wbc 11.2; hgb 10.8; hct 33.4; cv 82.1; plt 201     Review of Systems  Constitutional: Negative for malaise/fatigue.  Respiratory: Negative for cough and shortness of breath.   Cardiovascular: Negative for chest pain,  palpitations and leg swelling.  Gastrointestinal: has some nausea . Negative for abdominal pain, diarrhea and constipation.  Musculoskeletal: Negative for myalgias, back pain and joint pain.  Skin: Negative.   Neurological: Negative for dizziness.  Psychiatric/Behavioral: The patient is not nervous/anxious.    Physical Exam  Constitutional: She is oriented to person, place, and time. No distress.  Eyes: Conjunctivae are normal.  Neck: Neck supple. No JVD present. No thyromegaly present.  Cardiovascular: Normal rate, regular rhythm and intact distal pulses.   Respiratory: Effort normal and breath sounds normal. No respiratory distress. She has no wheezes.  GI: Soft. Bowel sounds are normal. She exhibits no distension. There is no tenderness.  Musculoskeletal: She exhibits no edema.  Able to move all extremities  Has right side weakness   Lymphadenopathy:    She has no cervical adenopathy.  Neurological: She is alert and oriented to person, place, and time.  Skin: Skin is warm and dry. She is not diaphoretic.  Psychiatric: She has a normal mood and affect.      ASSESSMENT/ PLAN:   1. Diabetes:   hgb a1c 10.2 will continue  tuojeo to 30 units daily; humalog SSI: 80-199 = 22 units; 200-299 = 26 units; 300-399 = 30 units >400 =34 units: will monitor   2. Hypertension; will continue norvasc 10 mg daily lisinopril 20 mg daily; asa 81 mg daily will monitor   3. Dyslipidemia: will continue crestor 5 mg daily ldl is 70   4. Hypokalemia: will continue k+ 10 meq daily   5. Gerd: : will continue protonix 40 mg twice daily and will monitor her status. I have spoken with speech therapy. Will continue zofran 4 mg every 6 hours as needed for n/v. Will begin reglan 2.5 mg four times daily as needed and will give her prior to therapy  Will complete her nystatin.   Is status post EGD on 09-08-15 with esophageal dilation.   6. Lower extremity edema: will continue lasix 80 mg daily with k+ 10 meq  daily   7. CVA: is neurologically stable; has mild right side weakness; will continue therapy as directed and will continue asa 81 mg daily and plavix 75 mg daily;    8. Anxiety: will continue klonopin 0.25 mg every 8 hours as needed  9. Right breast cancer:  is status post lumpectomy;  Will be starting radiation therapy will monitor will follow up with oncology    Time spent with patient  50  minutes >50% time spent counseling; reviewing medical record; tests; labs; and developing future plan of care    Ok Edwards NP [facility]  Contact [phone] Monday through Friday 8am- 5pm  After hours call [phone]

## 2015-09-14 ENCOUNTER — Ambulatory Visit: Payer: Medicare Other | Admitting: Radiation Oncology

## 2015-09-15 ENCOUNTER — Encounter: Payer: Self-pay | Admitting: Internal Medicine

## 2015-09-15 ENCOUNTER — Ambulatory Visit
Admission: RE | Admit: 2015-09-15 | Discharge: 2015-09-15 | Disposition: A | Discharge status: Home / Self Care | Payer: Medicare Other | Source: Ambulatory Visit | Attending: Radiation Oncology | Admitting: Radiation Oncology

## 2015-09-15 ENCOUNTER — Non-Acute Institutional Stay (SKILLED_NURSING_FACILITY): Payer: Medicare Other | Admitting: Internal Medicine

## 2015-09-15 DIAGNOSIS — I6330 Cerebral infarction due to thrombosis of unspecified cerebral artery: Secondary | ICD-10-CM

## 2015-09-15 DIAGNOSIS — K922 Gastrointestinal hemorrhage, unspecified: Secondary | ICD-10-CM

## 2015-09-15 DIAGNOSIS — Z853 Personal history of malignant neoplasm of breast: Secondary | ICD-10-CM

## 2015-09-15 DIAGNOSIS — I69391 Dysphagia following cerebral infarction: Secondary | ICD-10-CM | POA: Diagnosis not present

## 2015-09-15 DIAGNOSIS — E785 Hyperlipidemia, unspecified: Secondary | ICD-10-CM | POA: Diagnosis not present

## 2015-09-15 DIAGNOSIS — B3781 Candidal esophagitis: Secondary | ICD-10-CM

## 2015-09-15 DIAGNOSIS — K222 Esophageal obstruction: Secondary | ICD-10-CM | POA: Diagnosis not present

## 2015-09-15 DIAGNOSIS — E1165 Type 2 diabetes mellitus with hyperglycemia: Secondary | ICD-10-CM

## 2015-09-15 DIAGNOSIS — I10 Essential (primary) hypertension: Secondary | ICD-10-CM

## 2015-09-15 DIAGNOSIS — R5381 Other malaise: Secondary | ICD-10-CM | POA: Diagnosis not present

## 2015-09-15 DIAGNOSIS — R600 Localized edema: Secondary | ICD-10-CM

## 2015-09-15 DIAGNOSIS — E1149 Type 2 diabetes mellitus with other diabetic neurological complication: Secondary | ICD-10-CM

## 2015-09-15 DIAGNOSIS — Z794 Long term (current) use of insulin: Secondary | ICD-10-CM

## 2015-09-15 DIAGNOSIS — IMO0002 Reserved for concepts with insufficient information to code with codable children: Secondary | ICD-10-CM

## 2015-09-15 DIAGNOSIS — E870 Hyperosmolality and hypernatremia: Secondary | ICD-10-CM

## 2015-09-15 DIAGNOSIS — N183 Chronic kidney disease, stage 3 unspecified: Secondary | ICD-10-CM

## 2015-09-15 DIAGNOSIS — D638 Anemia in other chronic diseases classified elsewhere: Secondary | ICD-10-CM

## 2015-09-15 DIAGNOSIS — E876 Hypokalemia: Secondary | ICD-10-CM

## 2015-09-15 DIAGNOSIS — F419 Anxiety disorder, unspecified: Secondary | ICD-10-CM

## 2015-09-15 DIAGNOSIS — Z510 Encounter for antineoplastic radiation therapy: Secondary | ICD-10-CM | POA: Diagnosis not present

## 2015-09-15 DIAGNOSIS — D72829 Elevated white blood cell count, unspecified: Secondary | ICD-10-CM

## 2015-09-15 NOTE — Progress Notes (Signed)
LOCATION: [facility]  PCP: Donnie Coffin, MD   Code Status: Full Code  Goals of care: Advanced Directive information Advanced Directives 09/12/2015  Does patient have an advance directive? No  Would patient like information on creating an advanced directive? -       Extended Emergency Contact Information Primary Emergency Contact: Pontarelli,Virgie Address: [place], [place] [address] [place] Phone: [phone] Relation: Daughter Secondary Emergency Contact: Hammes,[name]  [place] Phone: [phone] Relation: Daughter   No Known Allergies  Chief Complaint  Patient presents with  . New Admit To SNF     HPI:  Patient is a 82 y.o. female seen today for short term rehabilitation post hospital admission from 08/24/15-08/28/15 with acute CVA and from 09/04/15-09/07/15 with nausea, vomiting and dysphagia. She was noted to have high-grade esophageal stricture on barium esophagogram. She was seen by GI and underwent EGD on 09/08/2015 with stricture dilation. EGD showed Candida esophagitis, benign stricture, gastric and internal polyp. Her PPI was continued and she was placed on nystatin. She has Coffee-ground emesis with concern for UGI bleed with a small mucosal tear. It resolved spontaneously. During the second hospitalization, she was noted to have extension of recent right cerebellar infarct, and new acute thalamic infarct: she was seen by neurology and placed on dual antiplatelet therapy. She has PMH of type 2 DM, CKD 3, HTN, history of breast cancer among others. She is seen in her room today.     Review of Systems:  Constitutional: Negative for fever, chills, diaphoresis. Energy level slowly returning.  HENT: Negative for headache, congestion, nasal discharge. Eyes: Negative for blurred vision, double vision and discharge. Wears glasses.  Respiratory: Negative for cough, shortness of breath and wheezing.     Cardiovascular: Negative for chest pain, palpitations, leg swelling.  Gastrointestinal: Negative for heartburn, vomiting, abdominal pain, loss of appetite. Positive for occasional nausea. Last bowel movement was yesterday.  Genitourinary: Negative for dysuria and flank pain.  Musculoskeletal: Negative for back pain, fall in the facility.  Skin: Negative for itching, rash.  Neurological: Negative for weakness. Positive for dizziness with change of position. Psychiatric/Behavioral: Negative for depression    Past Medical History  Diagnosis Date  . GERD (gastroesophageal reflux disease)   . Obesity   . DM (diabetes mellitus) ([place])   . HTN (hypertension)   . HLD (hyperlipidemia)   . CAD (coronary artery disease)   . UTI (urinary tract infection)   . Chest pain   . Dizziness   . Breast cancer ([place])   . Arthritis   . Stroke ([place])   . Shortness of breath dyspnea    Past Surgical History  Procedure Laterality Date  . Cholecystectomy    . Colon polyps; hx    . Coronary angioplasty  02/14/09  . Radioactive seed guided mastectomy with axillary sentinel lymph node biopsy Right 07/17/2015    Procedure: RADIOACTIVE SEED GUIDED PARTIAL MASTECTOMY WITH AXILLARY SENTINEL LYMPH NODE BIOPSY;  Surgeon: Erroll Luna, MD;  Location: [facility];  Service: General;  Laterality: Right;  . Esophagogastroduodenoscopy (egd) with propofol Left 09/08/2015    Procedure: ESOPHAGOGASTRODUODENOSCOPY (EGD) WITH PROPOFOL;  Surgeon: Clarene Essex, MD;  Location: [facility] ENDOSCOPY;  Service: Endoscopy;  Laterality: Left;   Social History:   reports that she has never smoked. She has never used smokeless tobacco. She reports that she does not drink alcohol or use illicit drugs.  Family History  Problem Relation Age of Onset  . Diabetes Mother   . Colon cancer Father     also had lung  . Diabetes    . Heart attack    . Diabetes Sister     [name]  . Breast cancer Sister     Medications:   Medication List       This  list is accurate as of: 09/15/15  4:06 PM.  Always use your most recent med list.               amLODipine 10 MG tablet  Commonly known as:  NORVASC  Take 10 mg by mouth daily.     aspirin 81 MG tablet  Take 81 mg by mouth daily.     clonazePAM 0.5 MG tablet  Commonly known as:  KLONOPIN  Take 0.5 tablets (0.25 mg total) by mouth 3 (three) times daily as needed for anxiety.     cloNIDine 0.2 MG tablet  Commonly known as:  CATAPRES  Take 0.2 mg by mouth every 6 (six) hours as needed.     clopidogrel 75 MG tablet  Commonly known as:  PLAVIX  Take 75 mg by mouth daily.     furosemide 80 MG tablet  Commonly known as:  LASIX  Take 80 mg by mouth daily.     HUMALOG KWIKPEN 100 UNIT/ML KiwkPen  Generic drug:  insulin lispro  Inject 22-34 Units into the skin 2 (two) times daily. Sliding scale as follows: 80-199 = 22 units 200-299 = 26 units 300-399 = 30 units >400 = 34 units     lisinopril 20 MG tablet  Commonly known as:  PRINIVIL,ZESTRIL  Take 1 tablet (20 mg total) by mouth daily.     nystatin 100000 UNIT/ML suspension  Commonly known as:  MYCOSTATIN  Take 5 mLs by mouth 4 (four) times daily. Stop date 09/17/15     ondansetron 4 MG tablet  Commonly known as:  ZOFRAN  Take 4 mg by mouth every 6 (six) hours as needed for nausea or vomiting.     pantoprazole 40 MG tablet  Commonly known as:  PROTONIX  Take 40 mg by mouth 2 (two) times daily.     potassium chloride 10 MEQ CR tablet  Commonly known as:  KLOR-CON  Take 10 mEq by mouth daily.     rosuvastatin 5 MG tablet  Commonly known as:  CRESTOR  Take 1 tablet (5 mg total) by mouth daily.     TOUJEO SOLOSTAR 300 UNIT/ML Sopn  Generic drug:  Insulin Glargine  Inject 30 Units into the skin every morning.        Immunizations: Immunization History  Administered Date(s) Administered  . Influenza-Unspecified 08/27/2015  . PPD Test 08/28/2015, 09/09/2015     Physical Exam: Filed Vitals:   09/15/15 1456  BP:  122/72  Pulse: 70  Temp: 97.5 F (36.4 C)  TempSrc: Oral  Resp: 16  Height: 5\' 6"  (1.676 m)  Weight: 152 lb (68.947 kg)  SpO2: 96%   Body mass index is 24.55 kg/(m^2).  General- elderly female, well built, in no acute distress Head- normocephalic, atraumatic Nose- no maxillary or frontal sinus tenderness, no nasal discharge Throat- moist mucus membrane, no oral thrush Eyes- PERRLA, EOMI, no pallor, no icterus, no discharge, normal conjunctiva, normal sclera Neck- no cervical lymphadenopathy Cardiovascular- normal s1,s2, no murmur, trace leg edema Respiratory- bilateral clear to auscultation, no wheeze, no rhonchi, no crackles, no use of accessory muscles  Abdomen- bowel sounds present, soft, non tender Musculoskeletal- able to move all 4 extremities, generalized weakness Neurological- alert and oriented to person, place and time Skin- warm and dry Psychiatry- normal mood and affect    Labs reviewed: Basic Metabolic Panel:  Recent Labs  08/28/15 0807 09/04/15 1940 09/05/15 0622 09/06/15 0629 09/08/15 0736  NA 142 139 146* 146*  --   K 3.4* 4.8 3.6 3.4* 4.4  CL 105 101 107 111  --   CO2 25 22 25 24   --   GLUCOSE 144* 256* 132* 97  --   BUN 31* 73* 60* 39*  --   CREATININE 1.62* 2.68* 2.09* 1.42*  --   CALCIUM 9.1 10.0 9.2 9.0  --   PHOS 4.5  --   --   --   --    Liver Function Tests:  Recent Labs  08/24/15 0900 08/28/15 0807 09/04/15 1940 09/05/15 0622  AST 17  --  33 17  ALT 10*  --  12* 10*  ALKPHOS 106  --  112 88  BILITOT 0.4  --  1.3* 0.6  PROT 7.4  --  7.6 6.0*  ALBUMIN 3.2* 3.0* 3.6 2.8*    Recent Labs  08/24/15 0900  LIPASE 35   No results for input(s): AMMONIA in the last 8760 hours. CBC:  Recent Labs  05/07/15 1251 05/22/15 1100  09/05/15 0622 09/06/15 0629 09/08/15 0756 09/09/15 09/09/15 0748  WBC 5.3 4.1  < > 6.4 5.7  --  11.2 11.2*  NEUTROABS 3.0 1.8  --   --   --   --   --   --   HGB 11.0* 9.7*  < > 10.3* 10.7* 10.9*  --   10.8*  HCT 34.0* 29.5*  < > 32.1* 33.1* 34.0*  --  33.4*  MCV 84.0 83.1  < > 82.9 83.2  --   --  82.1  PLT 207 209  < > 215 202  --   --  201  < > = values in this interval not displayed. Cardiac Enzymes:  Recent Labs  08/24/15 1133 09/05/15 0711  TROPONINI <0.03 0.03   BNP: Invalid input(s): POCBNP CBG:  Recent Labs  09/09/15 0020 09/09/15 0423 09/09/15 0801  GLUCAP 212* 184* 155*    Radiological Exams: Ct Head Wo Contrast  09/05/2015  CLINICAL DATA:  Vomiting. EXAM: CT HEAD WITHOUT CONTRAST TECHNIQUE: Contiguous axial images were obtained from the base of the skull through the vertex without intravenous contrast. COMPARISON:  08/24/2015 FINDINGS: Focal hypodensity in the right middle cerebellar peduncle directly lateral to the fourth ventricle corresponds to the area of infarction observed on 08/24/2015. This may represent edema. This appears slightly larger than the area of infarction observed on 08/24/2015, and possibility of extension of the infarct is a consideration. There is no hemorrhagic transformation. There is no intracranial hemorrhage. There is no extra-axial fluid collection. There is mild generalized atrophy. There is white matter hypodensity which is chronic and likely due to small vessel disease. Hypodensities in the left pons may represent remote lacunar infarctions. There also is remote lacunar infarction in the left internal capsule posterior limb. No bony abnormalities. The visible paranasal sinuses and orbits are unremarkable. IMPRESSION: Focal hypodensity at the right lateral aspect of the fourth ventricle, corresponding to the location of the right cerebellar peduncle infarction observed on 08/24/2015 and likely representing edema. This could represent extension of that prior infarction. No hemorrhage. No other significant interval change. There is mild generalized atrophy  and chronic white matter hypodensity which likely represents small vessel ischemic disease.  MRI will be the modality of choice if additional imaging is clinically warranted. Electronically Signed   By: Andreas Newport M.D.   On: 09/05/2015 00:54   Mr Jodene Nam Head Wo Contrast  08/25/2015  CLINICAL DATA:  Continued surveillance of acute posterior circulation infarct. EXAM: MRA NECK WITHOUT AND WITH CONTRAST MRA HEAD WITHOUT CONTRAST TECHNIQUE: Multiplanar and multiecho pulse sequences of the neck were obtained without and with intravenous contrast. Angiographic images of the neck were obtained using MRA technique without and with intravenous contrast.; Angiographic images of the Circle of Willis were obtained using MRA technique without intravenous contrast. CONTRAST:  79mL MULTIHANCE GADOBENATE DIMEGLUMINE 529 MG/ML IV SOLN COMPARISON:  MR brain 08/24/2015. FINDINGS: MRA NECK FINDINGS Dolichoectasia proximal vasculature. Conventional branching of the great vessels from the arch. The proximal RIGHT common carotid artery demonstrates a estimated 75% stenosis. No flow-limiting disease at the bifurcation or throughout the course of the visualized RIGHT cervical ICA. Poorly visualized proximal LEFT common carotid artery without definite proximal narrowing. No significant atheromatous change at the bifurcation. Cervical ICA widely patent on the LEFT. Both vertebral arteries are tortuous. They are equal in size. No definite ostial lesion. There may be a 75% stenosis of the proximal RIGHT vertebral 2 cm above its origin. MRA HEAD FINDINGS Mild non stenotic irregularity of the cavernous and supraclinoid ICA segments on the RIGHT. 50% smooth stenosis of the cavernous and supraclinoid ICA segments on the LEFT. No proximal stenosis of the anterior or middle cerebral arteries. No MCA branch occlusion. No visible intracranial aneurysm. Basilar artery is widely patent. Both vertebrals contribute equally to its formation. There is no significant proximal PCA disease or cerebellar branch occlusion. IMPRESSION: 50% stenosis  of the cavernous and supraclinoid ICA segments on the LEFT, non flow reducing. No significant intracranial posterior circulation disease. No extracranial carotid bifurcation or cervical ICA lesion. Proximal RIGHT common carotid artery and proximal RIGHT vertebral artery as described above. Electronically Signed   By: Staci Righter M.D.   On: 08/25/2015 09:06   Mr Angiogram Neck W Wo Contrast  08/25/2015  CLINICAL DATA:  Continued surveillance of acute posterior circulation infarct. EXAM: MRA NECK WITHOUT AND WITH CONTRAST MRA HEAD WITHOUT CONTRAST TECHNIQUE: Multiplanar and multiecho pulse sequences of the neck were obtained without and with intravenous contrast. Angiographic images of the neck were obtained using MRA technique without and with intravenous contrast.; Angiographic images of the Circle of Willis were obtained using MRA technique without intravenous contrast. CONTRAST:  31mL MULTIHANCE GADOBENATE DIMEGLUMINE 529 MG/ML IV SOLN COMPARISON:  MR brain 08/24/2015. FINDINGS: MRA NECK FINDINGS Dolichoectasia proximal vasculature. Conventional branching of the great vessels from the arch. The proximal RIGHT common carotid artery demonstrates a estimated 75% stenosis. No flow-limiting disease at the bifurcation or throughout the course of the visualized RIGHT cervical ICA. Poorly visualized proximal LEFT common carotid artery without definite proximal narrowing. No significant atheromatous change at the bifurcation. Cervical ICA widely patent on the LEFT. Both vertebral arteries are tortuous. They are equal in size. No definite ostial lesion. There may be a 75% stenosis of the proximal RIGHT vertebral 2 cm above its origin. MRA HEAD FINDINGS Mild non stenotic irregularity of the cavernous and supraclinoid ICA segments on the RIGHT. 50% smooth stenosis of the cavernous and supraclinoid ICA segments on the LEFT. No proximal stenosis of the anterior or middle cerebral arteries. No MCA branch occlusion. No visible  intracranial aneurysm. Basilar  artery is widely patent. Both vertebrals contribute equally to its formation. There is no significant proximal PCA disease or cerebellar branch occlusion. IMPRESSION: 50% stenosis of the cavernous and supraclinoid ICA segments on the LEFT, non flow reducing. No significant intracranial posterior circulation disease. No extracranial carotid bifurcation or cervical ICA lesion. Proximal RIGHT common carotid artery and proximal RIGHT vertebral artery as described above. Electronically Signed   By: Staci Righter M.D.   On: 08/25/2015 09:06   Mr Brain Wo Contrast  09/05/2015  CLINICAL DATA:  82 year old female with small right cerebellar infarct in April. Persistent nausea vomiting, recurrent dizziness. Initial encounter. EXAM: MRI HEAD WITHOUT CONTRAST TECHNIQUE: Multiplanar, multiecho pulse sequences of the brain and surrounding structures were obtained without intravenous contrast. COMPARISON:  Head CT without contrast midnight today. Brain MRI 08/24/2015 and earlier. FINDINGS: Major intracranial vascular flow voids are stable. There has been enlargement of the restricted diffusion along the right lateral aspect of the fourth ventricle in the right cerebellar hemisphere since April. The area now encompasses 9-10 mm, versus 3 mm previously. See series 5, image 14 and compare to series 4, image 15 previously. No other brainstem or cerebellar restricted diffusion. However, there is new curvilinear restricted diffusion in the lateral right thalamus near the posterior limb of the right internal capsule. Diffusion imaging elsewhere is stable. Mild T2 and FLAIR corresponding to the acute diffusion findings with no mass effect. There does appear to be a new punctate micro hemorrhage along the caudal aspect of the right thalamic diffusion abnormality as seen on series 9, image 45. There is underlying advanced chronic small vessel disease in the brainstem and thalami I with multiple bilateral  chronic lacunar infarcts. The left midbrain also is chronically affected. Aside from the acute findings the cerebellum is relatively spared. Patchy supratentorial white matter T2 and FLAIR hyperintensity is stable with no supratentorial cortical encephalomalacia. Scattered chronic micro hemorrhages are re- demonstrated in the posterior left hemisphere and brainstem. No midline shift, mass effect, evidence of mass lesion, ventriculomegaly, or other acute intracranial hemorrhage. Cervicomedullary junction and pituitary are within normal limits. Stable visualized cervical spine. Bone marrow signal is stable and within normal limits. Stable visualized internal auditory structures. Mastoids are clear. Trace paranasal sinus mucosal thickening is stable. Orbit and scalp soft tissues are stable. IMPRESSION: 1. Mild acute extension of the lacunar type right cerebellar infarct along the lateral wall of the fourth ventricle. No associated hemorrhage or mass effect. 2. Superimposed acute lacunar infarct in the lateral right thalamus with associated microhemorrhage. No mass effect. 3. Otherwise stable advanced chronic small vessel disease in the brainstem and thalami. Electronically Signed   By: Genevie Ann M.D.   On: 09/05/2015 11:19   Mr Brain Wo Contrast  08/24/2015  CLINICAL DATA:  Ataxia with nausea and vomiting since yesterday. Assessment for stroke. EXAM: MRI HEAD WITHOUT CONTRAST TECHNIQUE: Multiplanar, multiecho pulse sequences of the brain and surrounding structures were obtained without intravenous contrast. COMPARISON:  07/04/2014 FINDINGS: There is a 4 mm acute infarct in the medial aspect of the right middle cerebellar peduncle. No intracranial hemorrhage, mass, midline shift, or extra-axial fluid collection is identified. Small chronic infarcts are seen in the pons and thalami. A chronic infarct in the posterior limb of the internal capsule was acute on the prior MRI. There is new wallerian degeneration along the  left corticospinal tract in the cerebral peduncle. There is mild cerebral atrophy which is unchanged. Foci of T2 hyperintensity throughout the cerebral white matter and  pons are similar to the prior MRI and nonspecific but compatible with moderate chronic small vessel ischemic disease. Prior bilateral cataract extraction is noted. There is a trace right mastoid effusion, and there is minimal left anterior ethmoid air cell mucosal thickening. Major intracranial vascular flow voids are preserved. IMPRESSION: 1. Punctate acute infarct in the right middle cerebellar peduncle. 2. Moderate chronic small vessel ischemic changes as above. Electronically Signed   By: Logan Bores M.D.   On: 08/24/2015 13:18   Dg Esophagus  09/05/2015  CLINICAL DATA:  82 year old with recent stroke and chronic intermittent dysphagia, primarily for solids and pills. EXAM: ESOPHOGRAM/BARIUM SWALLOW TECHNIQUE: Single contrast examination was performed using  thin barium. FLUOROSCOPY TIME:  Radiation Exposure Index (as provided by the fluoroscopic device): 96.5 uGy*m2 If the device does not provide the exposure index: Fluoroscopy Time:  1 minutes. Number of Acquired Images: 1 spot image. Remainder of study obtained with fluoro store. COMPARISON:  None. FINDINGS: Limited study was performed with the patient in a semi erect position. Patient swallowed the contrast without difficulty. No aspiration was observed. There is moderate esophageal dysmotility with a decreased primary stripping wave and diffuse tertiary contractions. There is fixed luminal narrowing of the distal esophagus extending over approximately 1.4 cm. No definite mucosal ulceration is seen in this area. Some contrast did pass into the stomach. A 13 mm barium tablet was administered. This passed into the distal esophagus, but not through the distal esophageal stricture. IMPRESSION: 1. High-grade distal esophageal stricture. This is indeterminate in etiology and could reflect a  benign stricture from chronic reflux. Endoscopic evaluation recommended to exclude malignancy. 2. Moderate esophageal dysmotility. Electronically Signed   By: Richardean Sale M.D.   On: 09/05/2015 12:03   Dg Abd Acute W/chest  09/04/2015  CLINICAL DATA:  Coffee-ground emesis and melena beginning today. EXAM: DG ABDOMEN ACUTE W/ 1V CHEST COMPARISON:  08/24/2015 FINDINGS: There is no evidence of dilated bowel loops or free intraperitoneal air. No radiopaque calculi identified. Extensive vascular calcification noted within the abdomen pelvis. Severe lumbar spine degenerative changes also demonstrated. Heart size and mediastinal contours are within normal limits. Low lung volumes noted, however both lungs are clear. No evidence of pneumothorax or pleural effusion. IMPRESSION: Nonspecific, nonobstructive bowel gas pattern. No active cardiopulmonary disease. Electronically Signed   By: Earle Gell M.D.   On: 09/04/2015 23:47   Dg Abd Portable 1v  08/24/2015  CLINICAL DATA:  Nausea vomiting and vertigo, 2 days duration. EXAM: PORTABLE ABDOMEN - 1 VIEW COMPARISON:  None. FINDINGS: The bowel gas pattern is normal. No radio-opaque calculi or other significant radiographic abnormality are seen. IMPRESSION: Negative. Electronically Signed   By: Andreas Newport M.D.   On: 08/24/2015 20:11    Assessment/Plan  Physical deconditioning Post CVA, Will have her work with physical therapy and occupational therapy team to help with gait training and muscle strengthening exercises.fall precautions. Skin care. Encourage to be out of bed.   Right cerebellar CVA To f/u with neurology. Will have patient work with PT/OT as tolerated to regain strength and restore function.  Fall precautions are in place. Continue aspirin 81 mg daily, plavix 75 mg daily and crestor 5 mg daily  HTN Stable bp, continue lisinopril and norvasc, monitor bp and bmp  Esophageal stricture S/p dilation. Monitor clinically. Continue PPI and to f/u  with GI  Upper gi bleed likley from mucosal tear, continue pantoprazole 40 mg bid  Candida esophagitis Completed her nystatin course, monitor clinically. Denies dysphagia  Dysphagia Continue mechanical soft diet and to follow with SLP team  HLD Continue statin  DM Lab Results  Component Value Date   HGBA1C 10.2* 08/25/2015   Has poorly controlled dm, continue tuojeo 30 u daily with SSI and monitor cbg  ckd stage 3 Monitor bmp with her on lasix and lisinopril  Hypernatremia Monitor bmp  Hypokalemia continue kcl 10 meq daily with her on lasix  Leg edema Continue lasix with kcl check bmp  Anxiety continue klonopin 0.25 mg q8h prn and monitor  History of breast cancer Being followed by oncology  Anemia of chronic disease Monitor cbc with hx of ckd  Leukocytosis Afebrile, monitor cbc with diff   Goals of care: short term rehabilitation   Labs/tests ordered: cbc, cmp  Family/ staff Communication: reviewed care plan with patient and nursing supervisor    Blanchie Serve, MD Internal Medicine [facility] [address] [place], [place] [address] Cell Phone (Monday-Friday 8 am - 5 pm): [phone] On Call: [phone] and follow prompts after 5 pm and on weekends Office Phone: [phone] Office Fax: [phone]

## 2015-09-16 ENCOUNTER — Ambulatory Visit
Admit: 2015-09-16 | Discharge: 2015-09-16 | Disposition: A | Discharge status: Home / Self Care | Payer: Medicare Other | Attending: Radiation Oncology | Admitting: Radiation Oncology

## 2015-09-16 ENCOUNTER — Ambulatory Visit: Payer: Medicare Other | Admitting: Radiation Oncology

## 2015-09-16 ENCOUNTER — Ambulatory Visit
Admission: RE | Admit: 2015-09-16 | Discharge: 2015-09-16 | Disposition: A | Discharge status: Home / Self Care | Payer: Medicare Other | Source: Ambulatory Visit | Attending: Radiation Oncology | Admitting: Radiation Oncology

## 2015-09-16 ENCOUNTER — Encounter: Payer: Self-pay | Admitting: Radiation Oncology

## 2015-09-16 VITALS — BP 206/64 | HR 72 | Temp 98.6°F | Resp 18 | Ht 66.0 in

## 2015-09-16 DIAGNOSIS — Z510 Encounter for antineoplastic radiation therapy: Secondary | ICD-10-CM | POA: Diagnosis not present

## 2015-09-16 DIAGNOSIS — C50411 Malignant neoplasm of upper-outer quadrant of right female breast: Secondary | ICD-10-CM

## 2015-09-16 LAB — CBC AND DIFFERENTIAL
HEMATOCRIT: 36 % (ref 36–46)
Hemoglobin: 11.2 g/dL — AB (ref 12.0–16.0)
Neutrophils Absolute: 3 /uL
Platelets: 268 10*3/uL (ref 150–399)
WBC: 5.3 10^3/mL

## 2015-09-16 LAB — BASIC METABOLIC PANEL
BUN: 34 mg/dL — AB (ref 4–21)
Creatinine: 1.7 mg/dL — AB (ref 0.5–1.1)
GLUCOSE: 231 mg/dL
POTASSIUM: 4.4 mmol/L (ref 3.4–5.3)
SODIUM: 140 mmol/L (ref 137–147)

## 2015-09-16 LAB — HEPATIC FUNCTION PANEL
ALT: 3 U/L — AB (ref 7–35)
AST: 17 U/L (ref 13–35)
Alkaline Phosphatase: 100 U/L (ref 25–125)
Bilirubin, Total: 0.5 mg/dL

## 2015-09-16 NOTE — Progress Notes (Signed)
Glenda [name] has completed 4 fractions to her right breast.  She denies having pain and fatigue.  She is currently living at [facility] for physical therapy.  The skin on her right breast is intact.  She is using radiaplex. Her bp was elevated at 206/64 and 199/60 when retaken.  BP 206/64 mmHg  Pulse 72  Temp(Src) 98.6 F (37 C) (Oral)  Resp 18  Ht 5\' 6"  (1.676 m)  Wt

## 2015-09-16 NOTE — Progress Notes (Signed)
  Radiation Oncology         (336) [phone] ________________________________  Name: Glenda [name] MRN: [id number]  Date: 09/16/2015  DOB: Nov 17, 1933  Weekly Radiation Therapy Management    ICD-9-CM ICD-10-CM   1. Breast cancer of upper-outer quadrant of right female breast ([place]) 174.4 C50.411      Current Dose: 10.68 Gy     Planned Dose:  54.72 Gy  Narrative . . . . . . . . The patient presents for routine under treatment assessment.                                   The patient is without complaint.  Glenda [name] has completed 4 fractions to her right breast. She denies having pain and fatigue. She is currently living at [facility] for physical therapy.                                Set-up films were reviewed.                                 The chart was checked. Physical Findings. . .  height is 5\' 6"  (1.676 m). Her oral temperature is 98.6 F (37 C). Her blood pressure is 206/64 and her pulse is 72. Her respiration is 18. . Weight essentially stable.  No significant changes.  The lungs are clear. The heart has a regular rhythm and rate. Patient is sitting comfortably in a wheelchair. She is accompanied by one of her daughters on evaluation today. The right breast area shows no appreciable radiation reaction at this point. Impression . . . . . . . The patient is tolerating radiation. Plan . . . . . . . . . . . . Continue treatment as planned.  ________________________________   Blair Promise, PhD, MD

## 2015-09-17 ENCOUNTER — Telehealth: Payer: Self-pay | Admitting: *Deleted

## 2015-09-17 ENCOUNTER — Ambulatory Visit
Admission: RE | Admit: 2015-09-17 | Discharge: 2015-09-17 | Disposition: A | Discharge status: Home / Self Care | Payer: Medicare Other | Source: Ambulatory Visit | Attending: Radiation Oncology | Admitting: Radiation Oncology

## 2015-09-17 DIAGNOSIS — Z510 Encounter for antineoplastic radiation therapy: Secondary | ICD-10-CM | POA: Diagnosis not present

## 2015-09-17 NOTE — Telephone Encounter (Signed)
  Oncology Nurse Navigator Documentation    Navigator Encounter Type: Telephone (Left vm for return call to assess needs during xrt) (09/17/15 1300) Telephone: Outgoing Call (09/17/15 1300)Contact information provided.     Surgery Date: 07/17/15 (09/17/15 1300) Treatment Initiated Date: 07/17/15 (09/17/15 1300) Patient Visit Type: [id number] (09/17/15 1300) Treatment Phase: First Radiation Tx (09/17/15 1300)                            Time Spent with Patient: 15 (09/17/15 1300)

## 2015-09-18 ENCOUNTER — Ambulatory Visit
Admission: RE | Admit: 2015-09-18 | Discharge: 2015-09-18 | Disposition: A | Discharge status: Home / Self Care | Payer: Medicare Other | Source: Ambulatory Visit | Attending: Radiation Oncology | Admitting: Radiation Oncology

## 2015-09-18 DIAGNOSIS — Z510 Encounter for antineoplastic radiation therapy: Secondary | ICD-10-CM | POA: Diagnosis not present

## 2015-09-19 ENCOUNTER — Ambulatory Visit
Admission: RE | Admit: 2015-09-19 | Discharge: 2015-09-19 | Disposition: A | Discharge status: Home / Self Care | Payer: Medicare Other | Source: Ambulatory Visit | Attending: Radiation Oncology | Admitting: Radiation Oncology

## 2015-09-19 DIAGNOSIS — Z510 Encounter for antineoplastic radiation therapy: Secondary | ICD-10-CM | POA: Diagnosis not present

## 2015-09-23 ENCOUNTER — Ambulatory Visit
Admission: RE | Admit: 2015-09-23 | Discharge: 2015-09-23 | Disposition: A | Discharge status: Home / Self Care | Payer: Medicare Other | Source: Ambulatory Visit | Attending: Radiation Oncology | Admitting: Radiation Oncology

## 2015-09-23 DIAGNOSIS — Z510 Encounter for antineoplastic radiation therapy: Secondary | ICD-10-CM | POA: Diagnosis not present

## 2015-09-24 ENCOUNTER — Ambulatory Visit: Payer: Medicare Other

## 2015-09-24 ENCOUNTER — Ambulatory Visit
Admission: RE | Admit: 2015-09-24 | Discharge: 2015-09-24 | Disposition: A | Discharge status: Home / Self Care | Payer: Medicare Other | Source: Ambulatory Visit | Attending: Radiation Oncology | Admitting: Radiation Oncology

## 2015-09-24 ENCOUNTER — Encounter: Payer: Self-pay | Admitting: Radiation Oncology

## 2015-09-24 VITALS — BP 210/65 | HR 75 | Temp 98.0°F | Ht 66.0 in

## 2015-09-24 DIAGNOSIS — Z510 Encounter for antineoplastic radiation therapy: Secondary | ICD-10-CM | POA: Diagnosis not present

## 2015-09-24 DIAGNOSIS — C50411 Malignant neoplasm of upper-outer quadrant of right female breast: Secondary | ICD-10-CM

## 2015-09-24 NOTE — Progress Notes (Signed)
Glenda [name] has completed 9 fractions to her right breast.  She denies having pain.  She continues to have rehab at [facility] and said she is walking a little.  She is using radiaplex.  The skin on her right breast has hyperpigmentation.  Her bp was elevated at 207/61 and 210/65 when retaken.  Her daughter reports it is running in the 123XX123 systolic at [facility].  BP 207/61 mmHg  Pulse 73  Temp(Src) 98 F (36.7 C) (Oral)  Ht 5\' 6"  (1.676 m)  Wt

## 2015-09-24 NOTE — Progress Notes (Signed)
  Radiation Oncology         (336) [phone] ________________________________  Name: Glenda [name] MRN: [id number]  Date: 09/24/2015  DOB: 24-Feb-1934  Weekly Radiation Therapy Management    ICD-9-CM ICD-10-CM   1. Breast cancer of upper-outer quadrant of right female breast ([place]) 174.4 C50.411      Current Dose: 24.03 Gy     Planned Dose:  54.72 Gy  Narrative . . . . . . . . The patient presents for routine under treatment assessment.                                   Glenda [name] has completed 9 fractions to her right breast. She denies having pain. She continues to have rehab at [facility] and said she is walking a little. She is using radiaplex. The skin on her right breast has hyperpigmentation. Her bp was elevated at 207/61 and 210/65 when retaken. Her daughter reports it is running in the 123XX123 systolic at [facility].                                 Set-up films were reviewed.                                 The chart was checked. Physical Findings. . .  height is 5\' 6"  (1.676 m). Her oral temperature is 98 F (36.7 C). Her blood pressure is 210/65 and her pulse is 75. . Weight essentially stable.  No significant changes.  The lungs are clear. The heart has a regular rhythm and rate. Patient is sitting comfortably in a wheelchair. She is accompanied by one of her daughters on evaluation today. The right breast area shows mild hyperpigmentation changes. Impression . . . . . . . The patient is tolerating radiation. Plan . . . . . . . . . . . . Continue treatment as planned.  ________________________________   Blair Promise, PhD, MD  This document serves as a record of services personally performed by Gery Pray, MD. It was created on his behalf by Darcus Austin, a trained medical scribe. The creation of this record is based on the scribe's personal observations and the provider's statements to them. This document has been checked and approved by the attending provider.

## 2015-09-25 ENCOUNTER — Ambulatory Visit
Admission: RE | Admit: 2015-09-25 | Discharge: 2015-09-25 | Disposition: A | Discharge status: Home / Self Care | Payer: Medicare Other | Source: Ambulatory Visit | Attending: Radiation Oncology | Admitting: Radiation Oncology

## 2015-09-25 DIAGNOSIS — Z510 Encounter for antineoplastic radiation therapy: Secondary | ICD-10-CM | POA: Diagnosis not present

## 2015-09-26 ENCOUNTER — Ambulatory Visit
Admission: RE | Admit: 2015-09-26 | Discharge: 2015-09-26 | Disposition: A | Discharge status: Home / Self Care | Payer: Medicare Other | Source: Ambulatory Visit | Attending: Radiation Oncology | Admitting: Radiation Oncology

## 2015-09-26 DIAGNOSIS — Z510 Encounter for antineoplastic radiation therapy: Secondary | ICD-10-CM | POA: Diagnosis not present

## 2015-09-29 ENCOUNTER — Ambulatory Visit
Admission: RE | Admit: 2015-09-29 | Discharge: 2015-09-29 | Disposition: A | Discharge status: Home / Self Care | Payer: Medicare Other | Source: Ambulatory Visit | Attending: Radiation Oncology | Admitting: Radiation Oncology

## 2015-09-29 ENCOUNTER — Non-Acute Institutional Stay (SKILLED_NURSING_FACILITY): Payer: Medicare Other | Admitting: Family

## 2015-09-29 ENCOUNTER — Encounter: Payer: Self-pay | Admitting: Family

## 2015-09-29 DIAGNOSIS — C50411 Malignant neoplasm of upper-outer quadrant of right female breast: Secondary | ICD-10-CM

## 2015-09-29 DIAGNOSIS — I6330 Cerebral infarction due to thrombosis of unspecified cerebral artery: Secondary | ICD-10-CM

## 2015-09-29 DIAGNOSIS — I69391 Dysphagia following cerebral infarction: Secondary | ICD-10-CM | POA: Diagnosis not present

## 2015-09-29 DIAGNOSIS — E1165 Type 2 diabetes mellitus with hyperglycemia: Secondary | ICD-10-CM

## 2015-09-29 DIAGNOSIS — R2681 Unsteadiness on feet: Secondary | ICD-10-CM | POA: Diagnosis not present

## 2015-09-29 DIAGNOSIS — E782 Mixed hyperlipidemia: Secondary | ICD-10-CM | POA: Diagnosis not present

## 2015-09-29 DIAGNOSIS — I10 Essential (primary) hypertension: Secondary | ICD-10-CM | POA: Diagnosis not present

## 2015-09-29 DIAGNOSIS — E1140 Type 2 diabetes mellitus with diabetic neuropathy, unspecified: Secondary | ICD-10-CM | POA: Diagnosis not present

## 2015-09-29 DIAGNOSIS — K922 Gastrointestinal hemorrhage, unspecified: Secondary | ICD-10-CM

## 2015-09-29 DIAGNOSIS — Z510 Encounter for antineoplastic radiation therapy: Secondary | ICD-10-CM | POA: Diagnosis not present

## 2015-09-29 DIAGNOSIS — I2510 Atherosclerotic heart disease of native coronary artery without angina pectoris: Secondary | ICD-10-CM

## 2015-09-29 DIAGNOSIS — IMO0002 Reserved for concepts with insufficient information to code with codable children: Secondary | ICD-10-CM

## 2015-09-29 DIAGNOSIS — Z9889 Other specified postprocedural states: Secondary | ICD-10-CM

## 2015-09-29 DIAGNOSIS — Z794 Long term (current) use of insulin: Secondary | ICD-10-CM | POA: Diagnosis not present

## 2015-09-29 DIAGNOSIS — Z8719 Personal history of other diseases of the digestive system: Secondary | ICD-10-CM

## 2015-09-29 NOTE — Progress Notes (Signed)
Location:  [place] Room Number: 1203 Place of Service:  SNF [phone])  Provider: Marlowe Sax, FNP-C  PCP: Donnie Coffin, MD Patient Care Team: L.Donnie Coffin, MD as PCP - General (Family Medicine) Erroll Luna, MD as Consulting Physician (General Surgery) Nicholas Lose, MD as Consulting Physician (Hematology and Oncology) Gery Pray, MD as Consulting Physician (Radiation Oncology)  Extended Emergency Contact Information Primary Emergency Contact: Funnell,Virgie Address: [address]          [place], [place] [address] [place] Phone: [phone] Relation: Daughter Secondary Emergency Contact: Ellett,[name]  [place] Phone: [phone] Relation: Daughter  Code Status: Full Code Goals of care:  Advanced Directive information Advanced Directives 09/12/2015  Does patient have an advance directive? No  Would patient like information on creating an advanced directive? -     No Known Allergies  Chief Complaint  Patient presents with  . Discharge Note    Discharged from SNF    HPI:  82 y.o. female see today at [facility] for discharge home. She was here   for short term rehabilitation post hospital admission from 08/24/15-08/28/15 with acute CVA and from 09/04/15-09/07/15 with nausea, vomiting and dysphagia. She was noted to have high-grade esophageal stricture on barium esophagogram. She was seen by GI and underwent EGD on 09/08/2015 with stricture dilation. EGD showed Candida esophagitis, benign stricture, gastric and internal polyp. Her PPI was continued and she was placed on nystatin. She had Coffee-ground emesis with concern for UGI bleed with a small mucosal tear but resolved spontaneously. During the second hospitalization, she was noted to have extension of recent right cerebellar infarct, and new acute thalamic infarct: she was seen by neurology and placed on dual antiplatelet  therapy.  She has a significant medical history of HTN, CAD, Hyperlipidemia, Type 2 DM, GERD, obesity among other conditions. She is seen in her room today. She denies any acute issues this visit. She has worked well with PT/OT now stable for discharge home.She will be discharged home with Home health PT/OT to continue with ROM, Exercise, Gait stability and muscle strengthening. She will require  DME 16 X 18 standard WC with Cushion, anti tippers, extended brake handles, removable elevating leg rests  to enable her to maintain current level of independence with ADL's unable to perform with use of cane or walker. She is able to self propel on wheelchair and has caregiver able to assist with wheelchair. Home health services will be arranged by facility social worker prior to discharge. Prescription medication will be written x 1 month then patient to follow up with PCP in 1-2 weeks.Facility staff report no new concerns.      Past Medical History  Diagnosis Date  . GERD (gastroesophageal reflux disease)   . Obesity   . DM (diabetes mellitus) ([place])   . HTN (hypertension)   . HLD (hyperlipidemia)   . CAD (coronary artery disease)   . UTI (urinary tract infection)   . Chest pain   . Dizziness   . Breast cancer ([place])   . Arthritis   . Stroke ([place])   . Shortness of breath dyspnea     Past Surgical History  Procedure Laterality Date  . Cholecystectomy    . Colon polyps; hx    . Coronary angioplasty  02/14/09  . Radioactive seed guided mastectomy with axillary sentinel lymph node biopsy Right 07/17/2015    Procedure: RADIOACTIVE SEED GUIDED PARTIAL MASTECTOMY  WITH AXILLARY SENTINEL LYMPH NODE BIOPSY;  Surgeon: Erroll Luna, MD;  Location: [facility];  Service: General;  Laterality: Right;  . Esophagogastroduodenoscopy (egd) with propofol Left 09/08/2015    Procedure: ESOPHAGOGASTRODUODENOSCOPY (EGD) WITH PROPOFOL;  Surgeon: Clarene Essex, MD;  Location: [facility] ENDOSCOPY;  Service: Endoscopy;  Laterality:  Left;      reports that she has never smoked. She has never used smokeless tobacco. She reports that she does not drink alcohol or use illicit drugs. Social History   Social History  . Marital Status: Widowed    Spouse Name: N/A  . Number of Children: 5  . Years of Education: N/A   Occupational History  . Not on file.   Social History Main Topics  . Smoking status: Never Smoker   . Smokeless tobacco: Never Used  . Alcohol Use: No  . Drug Use: No  . Sexual Activity: Not Currently   Other Topics Concern  . Not on file   Social History Narrative   Retired.       No Known Allergies  Pertinent  Health Maintenance Due  Topic Date Due  . FOOT EXAM  02/03/1946  . OPHTHALMOLOGY EXAM  02/03/1946  . DEXA SCAN  02/03/2001  . PNA vac Low Risk Adult (1 of 2 - PCV13) 09/02/2016 (Originally 02/03/2001)  . INFLUENZA VACCINE  11/25/2015  . HEMOGLOBIN A1C  02/25/2016    Medications:   Medication List       This list is accurate as of: 09/29/15 11:54 AM.  Always use your most recent med list.               amLODipine 10 MG tablet  Commonly known as:  NORVASC  Take 10 mg by mouth daily.     aspirin 81 MG chewable tablet  Chew 81 mg by mouth daily.     clonazePAM 0.5 MG tablet  Commonly known as:  KLONOPIN  Take 0.5 tablets (0.25 mg total) by mouth 3 (three) times daily as needed for anxiety.     cloNIDine 0.2 MG tablet  Commonly known as:  CATAPRES  Take 0.2 mg by mouth every 6 (six) hours as needed. BP > 160     clopidogrel 75 MG tablet  Commonly known as:  PLAVIX  Take 75 mg by mouth daily.     furosemide 80 MG tablet  Commonly known as:  LASIX  Take 80 mg by mouth daily.     HUMALOG KWIKPEN 100 UNIT/ML KiwkPen  Generic drug:  insulin lispro  Inject 22-34 Units into the skin 2 (two) times daily. Sliding scale as follows: 80-199 = 22 units 200-299 = 26 units 300-399 = 30 units >400 = 34 units     lisinopril 20 MG tablet  Commonly known as:  PRINIVIL,ZESTRIL    Take 1 tablet (20 mg total) by mouth daily.     metoCLOPramide 5 MG tablet  Commonly known as:  REGLAN  Take 2.5 mg by mouth 4 (four) times daily as needed for nausea or vomiting. Give prior to therapy     omeprazole 20 MG capsule  Commonly known as:  PRILOSEC  Take 20 mg by mouth daily.     ondansetron 4 MG tablet  Commonly known as:  ZOFRAN  Take 4 mg by mouth every 6 (six) hours as needed for nausea or vomiting.     pantoprazole 40 MG tablet  Commonly known as:  PROTONIX  Take 40 mg by mouth 2 (two) times daily.  potassium chloride 10 MEQ CR tablet  Commonly known as:  KLOR-CON  Take 10 mEq by mouth daily.     promethazine 25 MG tablet  Commonly known as:  PHENERGAN  Take 25 mg by mouth every 6 (six) hours as needed for nausea or vomiting.     rosuvastatin 5 MG tablet  Commonly known as:  CRESTOR  Take 1 tablet (5 mg total) by mouth daily.     TOUJEO SOLOSTAR 300 UNIT/ML Sopn  Generic drug:  Insulin Glargine  Inject 30 Units into the skin every morning.        Review of Systems  Constitutional: Negative for activity change, appetite change, chills, fatigue and fever.  HENT: Negative for congestion, hearing loss, rhinorrhea, sinus pressure and sore throat.   Eyes: Negative.   Respiratory: Negative for cough, chest tightness, shortness of breath and wheezing.   Cardiovascular: Negative for chest pain, palpitations and leg swelling.  Gastrointestinal: Negative for abdominal distention, abdominal pain, constipation, diarrhea, nausea and vomiting.  Genitourinary: Negative for flank pain, frequency and urgency.  Musculoskeletal: Positive for gait problem.  Skin: Negative for color change, pallor and rash.  Neurological: Negative for dizziness, seizures, syncope, light-headedness and headaches.  Hematological: Does not bruise/bleed easily.  Psychiatric/Behavioral: Negative for agitation, behavioral problems, confusion, hallucinations and sleep disturbance. The  patient is not nervous/anxious.     Filed Vitals:   09/29/15 1125  BP: 165/71  Pulse: 77  Temp: 98.9 F (37.2 C)  Resp: 16  Height: 5\' 6"  (1.676 m)  Weight: 149 lb 12.8 oz (67.949 kg)  SpO2: 100%   Body mass index is 24.19 kg/(m^2). Physical Exam  Labs reviewed: Basic Metabolic Panel:  Recent Labs  08/28/15 0807  09/04/15 1940 09/05/15 0622 09/06/15 0629 09/08/15 0736 09/16/15  NA 142  < > 139 146* 146*  --  140  K 3.4*  < > 4.8 3.6 3.4* 4.4 4.4  CL 105  --  101 107 111  --   --   CO2 25  --  22 25 24   --   --   GLUCOSE 144*  --  256* 132* 97  --   --   BUN 31*  < > 73* 60* 39*  --  34*  CREATININE 1.62*  < > 2.68* 2.09* 1.42*  --  1.7*  CALCIUM 9.1  --  10.0 9.2 9.0  --   --   PHOS 4.5  --   --   --   --   --   --   < > = values in this interval not displayed. Liver Function Tests:  Recent Labs  08/24/15 0900 08/28/15 0807  09/04/15 1940 09/05/15 0622 09/16/15  AST 17  --   < > 33 17 17  ALT 10*  --   < > 12* 10* 3*  ALKPHOS 106  --   < > 112 88 100  BILITOT 0.4  --   --  1.3* 0.6  --   PROT 7.4  --   --  7.6 6.0*  --   ALBUMIN 3.2* 3.0*  --  3.6 2.8*  --   < > = values in this interval not displayed.  Recent Labs  08/24/15 0900  LIPASE 35   CBC:  Recent Labs  05/07/15 1251 05/22/15 1100  09/05/15 0622 09/06/15 0629 09/08/15 0756 09/09/15 09/09/15 0748 09/16/15  WBC 5.3 4.1  < > 6.4 5.7  --  11.2 11.2* 5.3  NEUTROABS 3.0 1.8  --   --   --   --   --   --  3  HGB 11.0* 9.7*  < > 10.3* 10.7* 10.9*  --  10.8* 11.2*  HCT 34.0* 29.5*  < > 32.1* 33.1* 34.0*  --  33.4* 36  MCV 84.0 83.1  < > 82.9 83.2  --   --  82.1  --   PLT 207 209  < > 215 202  --   --  201 268  < > = values in this interval not displayed. Cardiac Enzymes:  Recent Labs  08/24/15 1133 09/05/15 0711  TROPONINI <0.03 0.03    Recent Labs  09/09/15 0020 09/09/15 0423 09/09/15 0801  GLUCAP 212* 184* 155*    Assessment/Plan:   HTN B/p stable. Continue on Amlodipine,  nebivolol, Diovan-HCTZ and clonidine .Obtain BMP in 1-2 weeks with PCP.   CAD Chest pain free. Continue on Pravastatin ,ASA and Plavix.   Dysphagia  Status post hospital admission from 08/24/15-08/28/15 with acute CVA and from 09/04/15-09/07/15 with nausea, vomiting and dysphagia. She was noted to have high-grade esophageal stricture on barium esophagogram. She was seen by GI and underwent EGD on 09/08/2015 with stricture dilation. EGD showed Candida esophagitis, benign stricture, gastric and internal polyp.continue to monitor. Aspiration precautions.   Type 2 DM Continue on Humalog per SSI and Toujeo. Hgb A1C with PCP   CVA Status post hospital admission from 08/24/15-08/28/15 with acute CVA. Continue on Plavix and ASA   Hyperlipidemia  Continue on pravastatin. Lipid panel with PCP.   Breast Cancer Continue to follow up with Oncology as directed.   Unsteady Gait  has worked well with PT/OT now stable for discharge home.She will be discharged home with Home health PT/OT to continue with ROM, Exercise, Gait stability and muscle strengthening. Ordering DME 16 X 18 standard WC with Cushion, anti tippers, extended brake handles, removable elevating leg rests  to enable her to maintain current level of independence with ADL's unable to perform with use of cane or walker. She is able to self propel on wheelchair and has caregiver able to assist with wheelchair. Fall and safety precautions.   Esophageal stricture Status post dilation. Continue Protonix. Follow up with GI as directed.  Upper GI bleed Due to mucosal tear. Resolved spontaneously.Continue on Protonix 40 mg tablet twice daily. Continue to follow up with GI as directed.    Patient is being discharged with the following home health services:    -PT/OT to continue with ROM, Exercise, Gait stability and muscle strengthening.   Patient is being discharged with the following durable medical equipment:     16 X 18 standard WC with Cushion,  anti tippers, extended brake handles, removable elevating leg rests  to enable her to maintain current level of independence with ADL's unable to perform with use of cane or walker.   Patient has been advised to f/u with their PCP in 1-2 weeks to bring them up to date on their rehab stay.  Social services at facility was responsible for arranging this appointment.  Pt was provided with a 30 day supply of prescriptions for medications and refills must be obtained from their PCP.  For controlled substances, a more limited supply may be provided adequate until PCP appointment only.  Future labs/tests needed:  CBC, BMP in 1-2 weeks with PCP

## 2015-09-30 ENCOUNTER — Ambulatory Visit
Admission: RE | Admit: 2015-09-30 | Discharge: 2015-09-30 | Disposition: A | Discharge status: Home / Self Care | Payer: Medicare Other | Source: Ambulatory Visit | Attending: Radiation Oncology | Admitting: Radiation Oncology

## 2015-09-30 ENCOUNTER — Encounter: Payer: Self-pay | Admitting: Radiation Oncology

## 2015-09-30 VITALS — BP 189/58 | HR 73 | Temp 98.3°F | Ht 66.0 in

## 2015-09-30 DIAGNOSIS — C50411 Malignant neoplasm of upper-outer quadrant of right female breast: Secondary | ICD-10-CM

## 2015-09-30 DIAGNOSIS — Z510 Encounter for antineoplastic radiation therapy: Secondary | ICD-10-CM | POA: Diagnosis not present

## 2015-09-30 MED ORDER — RADIAPLEXRX EX GEL
Freq: Once | CUTANEOUS | Status: AC
Start: 2015-09-30 — End: 2015-09-30
  Administered 2015-09-30: 11:00:00 via TOPICAL

## 2015-09-30 NOTE — Progress Notes (Signed)
Glenda [name] has completed 13 fractions to her right breast.  She denies having pain or fatigue.  She is using radiaplex and has been given a refill.  The skin on her right breast has hyperpigmentation.  She will be discharged from [facility]  BP 189/58 mmHg  Pulse 73  Temp(Src) 98.3 F (36.8 C) (Oral)  Ht 5\' 6"  (1.676 m)  Wt   SpO2 100%   Wt Readings from Last 3 Encounters:  09/29/15 149 lb 12.8 oz (67.949 kg)  09/15/15 152 lb (68.947 kg)  09/12/15 143 lb (64.864 kg)

## 2015-09-30 NOTE — Progress Notes (Signed)
  Radiation Oncology         (336) [phone] ________________________________  Name: Glenda [name] MRN: [id number]  Date: 09/30/2015  DOB: 1933/08/12  Weekly Radiation Therapy Management    ICD-9-CM ICD-10-CM   1. Breast cancer of upper-outer quadrant of right female breast ([facility]) 174.4 C50.411 hyaluronate sodium (RADIAPLEXRX) gel    Current Dose: 34.71 Gy     Planned Dose:  54.72 Gy  Narrative . . . . . . . . The patient presents for routine under treatment assessment.                                   Glenda [name] has completed 13 fractions to her right breast. She denies having pain or fatigue. She is using radiaplex and has been given a refill. The skin on her right breast has hyperpigmentation. She will be discharged from [facility].                                 Set-up films were reviewed.                                 The chart was checked. Physical Findings. . .  height is 5\' 6"  (1.676 m). Her oral temperature is 98.3 F (36.8 C). Her blood pressure is 189/58 and her pulse is 73. Her oxygen saturation is 100%. . Weight essentially stable.  No significant changes.  The lungs are clear. The heart has a regular rhythm and rate. Patient is sitting comfortably in a wheelchair. The right breast area shows mild hyperpigmentation changes. Impression . . . . . . . The patient is tolerating radiation. Plan . . . . . . . . . . . . Continue treatment as planned.  ________________________________   Blair Promise, PhD, MD    This document serves as a record of services personally performed by Gery Pray, MD. It was created on his behalf by Lendon Collar, a trained medical scribe. The creation of this record is based on the scribe's personal observations and the provider's statements to them. This document has been checked and approved by the attending provider.

## 2015-09-30 NOTE — Progress Notes (Signed)
  Radiation Oncology         [phone] ________________________________  Name: Glenda [name] MRN: [id number]  Date: 09/30/2015  DOB: 04-18-1934  Simulation Verification Note    ICD-9-CM ICD-10-CM   1. Breast cancer of upper-outer quadrant of right female breast ([place]) 174.4 C50.411     Status: outpatient  NARRATIVE: The patient was brought to the treatment unit and placed in the planned treatment position. The clinical setup was verified. Then port films were obtained and uploaded to the radiation oncology medical record software.  The treatment beams were carefully compared against the planned radiation fields. The position location and shape of the radiation fields was reviewed. They targeted volume of tissue appears to be appropriately covered by the radiation beams. Organs at risk appear to be excluded as planned.  Based on my personal review, I approved the simulation verification. The patient's treatment will proceed as planned.  -----------------------------------  Blair Promise, PhD, MD

## 2015-10-01 ENCOUNTER — Ambulatory Visit: Payer: Medicare Other

## 2015-10-01 ENCOUNTER — Ambulatory Visit
Admission: RE | Admit: 2015-10-01 | Discharge: 2015-10-01 | Disposition: A | Discharge status: Home / Self Care | Payer: Medicare Other | Source: Ambulatory Visit | Attending: Radiation Oncology | Admitting: Radiation Oncology

## 2015-10-01 DIAGNOSIS — Z510 Encounter for antineoplastic radiation therapy: Secondary | ICD-10-CM | POA: Diagnosis not present

## 2015-10-02 ENCOUNTER — Ambulatory Visit: Payer: Medicare Other

## 2015-10-02 ENCOUNTER — Ambulatory Visit
Admission: RE | Admit: 2015-10-02 | Discharge: 2015-10-02 | Disposition: A | Discharge status: Home / Self Care | Payer: Medicare Other | Source: Ambulatory Visit | Attending: Radiation Oncology | Admitting: Radiation Oncology

## 2015-10-02 DIAGNOSIS — Z510 Encounter for antineoplastic radiation therapy: Secondary | ICD-10-CM | POA: Diagnosis not present

## 2015-10-03 ENCOUNTER — Ambulatory Visit
Admission: RE | Admit: 2015-10-03 | Discharge: 2015-10-03 | Disposition: A | Discharge status: Home / Self Care | Payer: Medicare Other | Source: Ambulatory Visit | Attending: Radiation Oncology | Admitting: Radiation Oncology

## 2015-10-03 ENCOUNTER — Ambulatory Visit: Payer: Medicare Other

## 2015-10-03 DIAGNOSIS — Z510 Encounter for antineoplastic radiation therapy: Secondary | ICD-10-CM | POA: Diagnosis not present

## 2015-10-04 ENCOUNTER — Ambulatory Visit: Payer: Medicare Other

## 2015-10-06 ENCOUNTER — Ambulatory Visit
Admission: RE | Admit: 2015-10-06 | Discharge: 2015-10-06 | Disposition: A | Discharge status: Home / Self Care | Payer: Medicare Other | Source: Ambulatory Visit | Attending: Radiation Oncology | Admitting: Radiation Oncology

## 2015-10-06 DIAGNOSIS — Z510 Encounter for antineoplastic radiation therapy: Secondary | ICD-10-CM | POA: Diagnosis not present

## 2015-10-07 ENCOUNTER — Ambulatory Visit
Admission: RE | Admit: 2015-10-07 | Discharge: 2015-10-07 | Disposition: A | Discharge status: Home / Self Care | Payer: Medicare Other | Source: Ambulatory Visit | Attending: Radiation Oncology | Admitting: Radiation Oncology

## 2015-10-07 ENCOUNTER — Encounter: Payer: Self-pay | Admitting: Radiation Oncology

## 2015-10-07 VITALS — BP 185/58 | HR 61 | Temp 97.9°F | Ht 66.0 in

## 2015-10-07 DIAGNOSIS — C50411 Malignant neoplasm of upper-outer quadrant of right female breast: Secondary | ICD-10-CM

## 2015-10-07 DIAGNOSIS — Z510 Encounter for antineoplastic radiation therapy: Secondary | ICD-10-CM | POA: Diagnosis not present

## 2015-10-07 NOTE — Progress Notes (Signed)
  Radiation Oncology         (336) [phone] ________________________________  Name: Glenda [name] MRN: [id number]  Date: 10/07/2015  DOB: Dec 21, 1933  Weekly Radiation Therapy Management    ICD-9-CM ICD-10-CM   1. Breast cancer of upper-outer quadrant of right female breast ([place]) 174.4 C50.411      Current Dose: 46.72 Gy     Planned Dose:  54.72 Gy  Narrative . . . . . . . . The patient presents for routine under treatment assessment.                                Glenda [name] has completed 18 fractions to her right breast. She denies having pain and fatigue. She has been discharged home from [facility]. She is using radiaplex gel. The skin on her right breast has hyperpigmentation. Her bp was elevated at 185/58 and 178/74 when retaken.                                 Set-up films were reviewed.                                 The chart was checked. Physical Findings. . .  height is 5\' 6"  (1.676 m). Her oral temperature is 97.9 F (36.6 C). Her blood pressure is 185/58 and her pulse is 61. . Lungs are clear to auscultation bilaterally. Heart has regular rate and rhythm. No palpable cervical, supraclavicular, or axillary adenopathy. Abdomen soft, non-tender, normal bowel sounds. The right breast area shows hyperpigmentation changes without any skin breakdown. Impression . . . . . . . The patient is tolerating radiation. Plan . . . . . . . . . . . . Continue treatment as planned.  ________________________________   Blair Promise, PhD, MD

## 2015-10-07 NOTE — Progress Notes (Signed)
Glenda [name] has completed 18 fractions to her right breast.  She denies having pain and fatigue.  She has been discharged home from [facility].  She is using radiaplex gel.  The skin on her right breast has hyperpigmentation.  Her bp was elevated at 185/58 and 178/74 when retaken.  BP 185/58 mmHg  Pulse 61  Temp(Src) 97.9 F (36.6 C) (Oral)  Ht 5\' 6"  (1.676 m)  Wt

## 2015-10-08 ENCOUNTER — Ambulatory Visit: Payer: Medicare Other

## 2015-10-08 ENCOUNTER — Ambulatory Visit
Admission: RE | Admit: 2015-10-08 | Discharge: 2015-10-08 | Disposition: A | Discharge status: Home / Self Care | Payer: Medicare Other | Source: Ambulatory Visit | Attending: Radiation Oncology | Admitting: Radiation Oncology

## 2015-10-08 DIAGNOSIS — Z510 Encounter for antineoplastic radiation therapy: Secondary | ICD-10-CM | POA: Diagnosis not present

## 2015-10-09 ENCOUNTER — Ambulatory Visit: Payer: Medicare Other

## 2015-10-09 ENCOUNTER — Ambulatory Visit
Admission: RE | Admit: 2015-10-09 | Discharge: 2015-10-09 | Disposition: A | Discharge status: Home / Self Care | Payer: Medicare Other | Source: Ambulatory Visit | Attending: Radiation Oncology | Admitting: Radiation Oncology

## 2015-10-09 DIAGNOSIS — Z510 Encounter for antineoplastic radiation therapy: Secondary | ICD-10-CM | POA: Diagnosis not present

## 2015-10-10 ENCOUNTER — Ambulatory Visit: Payer: Medicare Other

## 2015-10-10 ENCOUNTER — Ambulatory Visit: Payer: Medicare Other | Admitting: Radiation Oncology

## 2015-10-10 ENCOUNTER — Ambulatory Visit
Admission: RE | Admit: 2015-10-10 | Discharge: 2015-10-10 | Disposition: A | Discharge status: Home / Self Care | Payer: Medicare Other | Source: Ambulatory Visit | Attending: Radiation Oncology | Admitting: Radiation Oncology

## 2015-10-10 DIAGNOSIS — Z510 Encounter for antineoplastic radiation therapy: Secondary | ICD-10-CM | POA: Diagnosis not present

## 2015-10-13 ENCOUNTER — Ambulatory Visit
Admission: RE | Admit: 2015-10-13 | Discharge: 2015-10-13 | Disposition: A | Discharge status: Home / Self Care | Payer: Medicare Other | Source: Ambulatory Visit | Attending: Radiation Oncology | Admitting: Radiation Oncology

## 2015-10-13 ENCOUNTER — Telehealth: Payer: Self-pay | Admitting: *Deleted

## 2015-10-13 ENCOUNTER — Encounter: Payer: Self-pay | Admitting: Radiation Oncology

## 2015-10-13 VITALS — BP 188/61 | HR 62 | Temp 98.0°F | Ht 66.0 in

## 2015-10-13 DIAGNOSIS — Z510 Encounter for antineoplastic radiation therapy: Secondary | ICD-10-CM | POA: Diagnosis not present

## 2015-10-13 DIAGNOSIS — C50411 Malignant neoplasm of upper-outer quadrant of right female breast: Secondary | ICD-10-CM

## 2015-10-13 NOTE — Telephone Encounter (Signed)
  Oncology Nurse Navigator Documentation    Navigator Encounter Type: Telephone (10/13/15 1500) Telephone: Outgoing Call (10/13/15 1500)         Patient Visit Type: RadOnc (10/13/15 1500) Treatment Phase: Final Radiation Tx (10/13/15 1500) Barriers/Navigation Needs: No barriers at this time;No Questions;No Needs (10/13/15 1500)   Interventions: Referrals (10/13/15 1500) Referrals: Survivorship (10/13/15 1500)          Acuity: Level 1 (10/13/15 1500)         Time Spent with Patient: 15 (10/13/15 1500)

## 2015-10-13 NOTE — Progress Notes (Signed)
  Radiation Oncology         ([phone]) [phone] ________________________________  Name: Simiya Music MRN: [id number]  Date: 10/13/2015  DOB: January 22, 1934  Weekly Radiation Therapy Management    ICD-9-CM ICD-10-CM   1. Breast cancer of upper-outer quadrant of right female breast ([place]) 174.4 C50.411      Current Dose: 54.72 Gy     Planned Dose:  54.72 Gy  Narrative . . . . . . . . The patient presents for routine under treatment assessment.                               She has chronically elevated BP. Vitals noted. Denies acute symptoms nor pain.  She is in a WC without complaint.                                  Set-up films were reviewed.                                 The chart was checked. Physical Findings. . .  height is 5\' 6"  (1.676 m). Her oral temperature is 98 F (36.7 C). Her blood pressure is 188/61 and her pulse is 62. Her oxygen saturation is 100%. .  The right breast area shows hyperpigmentation without any skin breakdown. Impression . . . . . . . The patient has tolerated radiotherapy Plan . . . . . . . . . . . .f/u in 64mo Take BP meds as Rx'd. ________________________________     Eppie Gibson, MD

## 2015-10-13 NOTE — Progress Notes (Addendum)
Glenda [name] has completed treatment with 22 fractions to her right breast.  She denies having pain or fatigue.  She is using radiaplex gel.  The skin on her right breast has hyperpigmentation.  She has been given a one month follow up appointment.  Her bp continues to be elevated at 201/61 and 188/61 when rechecked.   BP 188/61 mmHg  Pulse 62  Temp(Src) 98 F (36.7 C) (Oral)  Ht 5\' 6"  (1.676 m)  Wt   SpO2 100%

## 2015-10-22 NOTE — Progress Notes (Signed)
  Radiation Oncology         [phone]) [phone] ________________________________  Name: Glenda [name] MRN: [id number]  Date: 10/13/2015  DOB: [birth year]  End of Treatment Note   ICD-9-CM ICD-10-CM    1. Breast cancer of upper-outer quadrant of right female breast ([place]) 174.4 C50.411     DIAGNOSIS: (Stage T1 aN0) Invasive ductal carcinoma with DCIS of the right breast ER/PR negative, s/p lumpectomy and sentinel node procedure     Indication for treatment: Post-operative to prevent a recurrence, Breast conservation therapy  Radiation treatment dates:   09/11/2015 - 10/13/2015  Site/dose: 1) 42.72 Gy in 16 fractions to the right breast   2) 12 Gy in 6 fractions as a boost to the right breast  Beams/energy: 1) Conformal // 6X Photon    2) En Face // 18MeV Electron  Narrative: The patient tolerated radiation treatment relatively well. Hyperpigmentation changes in the breast but no skin breakdown  Plan: The patient has completed radiation treatment. The patient will return to radiation oncology clinic for routine followup in one month. I advised them to call or return sooner if they have any questions or concerns related to their recovery or treatment.  -----------------------------------  Blair Promise, PhD, MD  This document serves as a record of services personally performed by Gery Pray, MD. It was created on his behalf by Darcus Austin, a trained medical scribe. The creation of this record is based on the scribe's personal observations and the provider's statements to them. This document has been checked and approved by the attending provider.

## 2015-10-24 ENCOUNTER — Telehealth: Payer: Self-pay | Admitting: Neurology

## 2015-10-24 NOTE — Telephone Encounter (Signed)
Daughter Glenda [name] called to cancel appointments July 17th with Dr. Leonie Man and July 25th with NP, Cecille Rubin. States patient was seen by PCP Dr. Alroy Dust recently and while at visit asked if patient needed to keep appointments with our office, states Dr. Alroy Dust advised patient wouldn't need to keep these appointments. Appointments cancelled.

## 2015-10-31 ENCOUNTER — Ambulatory Visit: Payer: Medicare Other | Attending: Family Medicine | Admitting: Rehabilitation

## 2015-10-31 DIAGNOSIS — R278 Other lack of coordination: Secondary | ICD-10-CM | POA: Insufficient documentation

## 2015-10-31 DIAGNOSIS — R2681 Unsteadiness on feet: Secondary | ICD-10-CM | POA: Diagnosis present

## 2015-10-31 DIAGNOSIS — R41844 Frontal lobe and executive function deficit: Secondary | ICD-10-CM | POA: Diagnosis present

## 2015-10-31 DIAGNOSIS — M6281 Muscle weakness (generalized): Secondary | ICD-10-CM | POA: Insufficient documentation

## 2015-10-31 DIAGNOSIS — R2689 Other abnormalities of gait and mobility: Secondary | ICD-10-CM | POA: Insufficient documentation

## 2015-10-31 DIAGNOSIS — M25611 Stiffness of right shoulder, not elsewhere classified: Secondary | ICD-10-CM | POA: Diagnosis present

## 2015-10-31 NOTE — Therapy (Signed)
[facility] [address] [place], [place], [address] Phone: [phone]   Fax:  [phone]  Physical Therapy Evaluation  Patient Details  Name: Glenda [name] MRN: [id number] Date of Birth: 1933-05-04 Referring Provider: Donnie Coffin, MD  Encounter Date: 10/31/2015      PT End of Session - 10/31/15 1129    Visit Number 1   Number of Visits 17   Date for PT Re-Evaluation 12/30/15   Authorization Type [employer] MCR- G code needed every 10th visit   PT Start Time 1016   PT Stop Time 1100   PT Time Calculation (min) 44 min   Activity Tolerance Patient limited by fatigue   Behavior During Therapy [facility] for tasks assessed/performed      Past Medical History  Diagnosis Date  . GERD (gastroesophageal reflux disease)   . Obesity   . DM (diabetes mellitus) ([place])   . HTN (hypertension)   . HLD (hyperlipidemia)   . CAD (coronary artery disease)   . UTI (urinary tract infection)   . Chest pain   . Dizziness   . Breast cancer ([place])   . Arthritis   . Stroke ([place])   . Shortness of breath dyspnea     Past Surgical History  Procedure Laterality Date  . Cholecystectomy    . Colon polyps; hx    . Coronary angioplasty  02/14/09  . Radioactive seed guided mastectomy with axillary sentinel lymph node biopsy Right 07/17/2015    Procedure: RADIOACTIVE SEED GUIDED PARTIAL MASTECTOMY WITH AXILLARY SENTINEL LYMPH NODE BIOPSY;  Surgeon: Erroll Luna, MD;  Location: [facility];  Service: General;  Laterality: Right;  . Esophagogastroduodenoscopy (egd) with propofol Left 09/08/2015    Procedure: ESOPHAGOGASTRODUODENOSCOPY (EGD) WITH PROPOFOL;  Surgeon: Clarene Essex, MD;  Location: [facility] ENDOSCOPY;  Service: Endoscopy;  Laterality: Left;    There were no vitals filed for this visit.       Subjective Assessment - 10/31/15 1022    Subjective "I had a stroke and the R side is kinda weak, so I want to work on that."    Patient is accompained  by: Family member  Irven Coe   Limitations Walking;House hold activities   Patient Stated Goals "I want to work on walking"    Currently in Pain? No/denies            [facility] PT Assessment - 10/31/15 0001    Assessment   Medical Diagnosis CVA   Referring Provider Donnie Coffin, MD   Onset Date/Surgical Date 08/24/15   Hand Dominance Right   Prior Therapy hospital, [facility]   Precautions   Precautions Fall   Balance Screen   Has the patient fallen in the past 6 months No   Has the patient had a decrease in activity level because of a fear of falling?  Yes   Is the patient reluctant to leave their home because of a fear of falling?  Yes   [place] residence   [place]  5 children rotate for 24/7 care   Available Help at Discharge Family   Type of [place] level   Home Equipment Wheelchair - manual;Walker - 2 wheels;Shower seat;Bedside commode;Grab bars - tub/shower;Hand held shower head  tub/shower    Prior Function   Level of Independence Independent   Vocation Retired   Leisure Likes to [employer], active in church, grand  and great grand kids   Observation/Other Assessments   Focus on Therapeutic Outcomes (FOTO)  SIS mobility: 25%   Sensation   Light Touch Appears Intact   Stereognosis Appears Intact   Hot/Cold Appears Intact   Proprioception Appears Intact   Coordination   Gross Motor Movements are Fluid and Coordinated No   Fine Motor Movements are Fluid and Coordinated No   Coordination and Movement Description Limited due to strength deficits.    ROM / Strength   AROM / PROM / Strength Strength   Strength   Overall Strength Deficits   Overall Strength Comments R hip flex 3-/5, L hip flex 3/5, R knee ext 3+/5, R knee flex 3+/5, R ankle DF 3+/5 and R ankle PF 4/5, all others grossly 4/5   Transfers   Transfers Sit to Stand;Stand to Sit    Sit to Stand 4: Min guard;4: Min assist;With upper extremity assist;With armrests   Sit to Stand Details Verbal cues for sequencing;Verbal cues for technique;Verbal cues for precautions/safety;Manual facilitation for weight shifting   Sit to Stand Details (indicate cue type and reason) Cues for increased forward weight shift and hand placement.    Stand to Sit 4: Min guard;With upper extremity assist;With armrests   Transfer Cueing Cues for sequencing and safety with RW   Standardized Balance Assessment   Standardized Balance Assessment Timed Up and Go Test   Timed Up and Go Test   TUG Normal TUG   Normal TUG (seconds) 101.4                           PT Education - 10/31/15 1128    Education provided Yes   Education Details Education on POC, goals, beginning to increase standing and walking at home with RW and assist from family   Person(s) Educated Patient;Child(ren)   Methods Explanation   Comprehension Verbalized understanding          PT Short Term Goals - 10/31/15 1956    PT SHORT TERM GOAL #1   Title Pt will initiate HEP for strengthening and balance in order to improve functional mobility.  (Target Date: 11/28/15)   Status New   PT SHORT TERM GOAL #2   Title Pt will improve TUG score to <80 seconds in order to indicate decreased fall risk.     Status New   PT SHORT TERM GOAL #3   Title Will assess BERG and improve score by 4 points in order to indicate decreased fall risk and functional improvement in balance.     Status New   PT SHORT TERM GOAL #4   Title Pt will ambulate over indoor surfaces x 150' w/ RW at S level in order to indicate safe negotiation in home.     PT SHORT TERM GOAL #5   Title Pt will verbalize fall prevention strategies in order to indicate decreased fall risk inside and outside of home.     Additional Short Term Goals   Additional Short Term Goals Yes   PT SHORT TERM GOAL #6   Title Pt will perform 8/10 sit <>stands with single UE  support only at mod I level in order to indicate improved functional strength.             PT Long Term Goals - 10/31/15 2000    PT LONG TERM GOAL #1   Title Pt will be independent with HEP in order to indicate improved functional mobility and decreased  fall risk.  (Target Date: 12/26/15)   Status New   PT LONG TERM GOAL #2   Title Pt will improve TUG score to <40 secs in order to indicate decreased fall risk.     Status New   PT LONG TERM GOAL #3   Title Pt will improve BERG balance score 8 points from baseline in order to indicate decreased fall risk.     Status New   PT LONG TERM GOAL #4   Title Pt will perform 8/10 sit<>stand without UE support at mod I level in order to indicate improved functional strength.     Status New   PT LONG TERM GOAL #5   Title Pt will ambulate 300' w/ LRAD over paved outdoor surfaces at mod I level in order to indicate safe return to community.     Additional Long Term Goals   Additional Long Term Goals Yes   PT LONG TERM GOAL #6   Title Pt will negotiate up/down 5 steps w/ single rail at mod I level in order to indicate safe entry into home/family's homes.     Status New               Plan - 10/31/15 1947    Clinical Impression Statement Pt presents with R cerebellar and R lateral thalamus CVA with resulting R sided weakness, decreased balance, vestibular deficits as well as visual deficits.  Note history of DM with neuropathy, CAD, and HTN in medical history that could all impact pts progress in PT.  Upon PT evaluation, note that she has decreased strength and coordination in RLE, TUG score of 101.40 secs, highly indicative of fall risk and note that she is using mostly w/c around her house.  She requires min/guard with RW to ambulate short distances during PT evaluattion.  She is of evolving presentation and moderate complexity per PT POC.  Pt will greatly benefit from skilled PT at OP neuro in order to address deficits.     Rehab Potential  Excellent   PT Frequency 2x / week   PT Duration 8 weeks   PT Treatment/Interventions ADLs/Self Care Home Management;Electrical Stimulation;DME Instruction;Gait training;Stair training;Functional mobility training;Therapeutic activities;Therapeutic exercise;Balance training;Neuromuscular re-education;Patient/family education;Orthotic Fit/Training;Vestibular;Visual/perceptual remediation/compensation   PT Next Visit Plan Have they been standing and walking at home? est HEP for BLE strengthening, assess balance more formally (BERG)   Consulted and Agree with Plan of Care Patient;Family member/caregiver   Family Member Consulted daughter [name]      Patient will benefit from skilled therapeutic intervention in order to improve the following deficits and impairments:  Abnormal gait, Decreased activity tolerance, Decreased balance, Decreased coordination, Decreased endurance, Decreased knowledge of use of DME, Decreased mobility, Decreased strength, Dizziness, Increased edema, Impaired perceived functional ability, Impaired flexibility, Impaired UE functional use, Impaired vision/preception, Improper body mechanics, Postural dysfunction  Visit Diagnosis: Unsteadiness on feet - Plan: PT plan of care cert/re-cert  Other abnormalities of gait and mobility - Plan: PT plan of care cert/re-cert  Muscle weakness (generalized) - Plan: PT plan of care cert/re-cert      G-Codes - 123XX123 2007    Functional Assessment Tool Used TUG: 101.40 secs, min/guard with RW for up to 25' of gait.    Functional Limitation Mobility: Walking and moving around   Mobility: Walking and Moving Around Current Status [phone]) At least 80 percent but less than 100 percent impaired, limited or restricted   Mobility: Walking and Moving Around Goal Status [id number]) At least 1 percent but  less than 20 percent impaired, limited or restricted       Problem List Patient Active Problem List   Diagnosis Date Noted  . Cerebral  thrombosis with cerebral infarction 09/07/2015  . Lacunar infarct, acute ([place])   . Nausea & vomiting 09/05/2015  . Acute kidney injury ([place]) 09/04/2015  . Type II diabetes mellitus with neurological manifestations, uncontrolled ([place]) 09/03/2015  . Bilateral lower extremity edema 09/03/2015  . Essential hypertension   . Diabetes mellitus type 2 in nonobese ([facility])   . History of breast cancer   . AKI (acute kidney injury) ([place])   . Acute blood loss anemia   . Hemiparesis ([place])   . Gait disturbance, post-stroke   . Dysphagia, post-stroke   . Acute ischemic stroke/right cerebellar 08/24/2015  . Breast cancer of upper-outer quadrant of right female breast ([place]) 04/30/2015  . CVA (cerebral infarction) 07/05/2014  . Hyperlipidemia 07/05/2014  . Syncope 07/04/2014  . Mild mitral regurgitation by prior echocardiogram 12/20/2011  . Diabetes mellitus type 1, uncontrolled, insulin dependent ([place]) 02/26/2009  . OBESITY 02/26/2009  . Uncontrolled hypertension 02/26/2009  . CAD (coronary artery disease) 02/26/2009  . GASTROESOPHAGEAL REFLUX DISEASE 02/26/2009    Cameron Sprang, PT, MPT [facility] [address] [place], [place], [address] Phone: [phone]   Fax:  [phone] 10/31/2015, 8:11 PM  Name: Glenda [name] MRN: [id number] Date of Birth: 1933-07-02

## 2015-11-04 ENCOUNTER — Ambulatory Visit: Payer: Medicare Other | Admitting: Physical Therapy

## 2015-11-04 ENCOUNTER — Encounter: Payer: Self-pay | Admitting: Physical Therapy

## 2015-11-04 DIAGNOSIS — M6281 Muscle weakness (generalized): Secondary | ICD-10-CM

## 2015-11-04 DIAGNOSIS — R2681 Unsteadiness on feet: Secondary | ICD-10-CM

## 2015-11-04 DIAGNOSIS — R2689 Other abnormalities of gait and mobility: Secondary | ICD-10-CM

## 2015-11-04 NOTE — Therapy (Signed)
[facility] [address] [place], [place], [address] Phone: [phone]   Fax:  [phone]  Physical Therapy Treatment  Patient Details  Name: Glenda [name] MRN: [id number] Date of Birth: 1933-07-04 Referring Provider: Donnie Coffin, MD  Encounter Date: 11/04/2015      PT End of Session - 11/04/15 1456    Visit Number 2   Number of Visits 17   Date for PT Re-Evaluation 12/30/15   Authorization Type UHC MCR- G code needed every 10th visit   PT Start Time 1450   PT Stop Time 1530   PT Time Calculation (min) 40 min   Activity Tolerance Patient limited by fatigue   Behavior During Therapy [facility] for tasks assessed/performed      Past Medical History  Diagnosis Date  . GERD (gastroesophageal reflux disease)   . Obesity   . DM (diabetes mellitus) ([place])   . HTN (hypertension)   . HLD (hyperlipidemia)   . CAD (coronary artery disease)   . UTI (urinary tract infection)   . Chest pain   . Dizziness   . Breast cancer ([place])   . Arthritis   . Stroke ([place])   . Shortness of breath dyspnea     Past Surgical History  Procedure Laterality Date  . Cholecystectomy    . Colon polyps; hx    . Coronary angioplasty  02/14/09  . Radioactive seed guided mastectomy with axillary sentinel lymph node biopsy Right 07/17/2015    Procedure: RADIOACTIVE SEED GUIDED PARTIAL MASTECTOMY WITH AXILLARY SENTINEL LYMPH NODE BIOPSY;  Surgeon: Erroll Luna, MD;  Location: [place];  Service: General;  Laterality: Right;  . Esophagogastroduodenoscopy (egd) with propofol Left 09/08/2015    Procedure: ESOPHAGOGASTRODUODENOSCOPY (EGD) WITH PROPOFOL;  Surgeon: Clarene Essex, MD;  Location: [facility] ENDOSCOPY;  Service: Endoscopy;  Laterality: Left;    There were no vitals filed for this visit.      Subjective Assessment - 11/04/15 1455    Subjective No new complaints. No falls or pain to report.    Patient is accompained by: Family member  daughter,  [name]   Patient Stated Goals "I want to work on walking"    Currently in Pain? No/denies   Pain Score 0-No pain            [facility] PT Assessment - 11/04/15 1457    Berg Balance Test   Sit to Stand Needs minimal aid to stand or to stabilize   Standing Unsupported Able to stand 2 minutes with supervision   Sitting with Back Unsupported but Feet Supported on Floor or Stool Able to sit safely and securely 2 minutes   Stand to Sit Sits independently, has uncontrolled descent   Transfers Needs one person to assist   Standing Unsupported with Eyes Closed Able to stand 3 seconds   Standing Ubsupported with Feet Together Needs help to attain position and unable to hold for 15 seconds   From Standing, Reach Forward with Outstretched Arm Loses balance while trying/requires external support   From Standing Position, Pick up Object from Floor Unable to try/needs assist to keep balance   From Standing Position, Turn to Look Behind Over each Shoulder Needs assist to keep from losing balance and falling   Turn 360 Degrees Needs assistance while turning   Standing Unsupported, Alternately Place Feet on Step/Stool Needs assistance to keep from falling or unable to try   Standing Unsupported, One Foot in [employer] balance while stepping or standing   Standing  on One Leg Unable to try or needs assist to prevent fall   Total Score 12   Berg comment: 12/56= high risk for falls (close to 100%)            [facility] Adult PT Treatment/Exercise - 11/04/15 1457    Transfers   Transfers Sit to Stand;Stand to Sit   Sit to Stand 4: Min guard;4: Min assist;From elevated surface;With armrests;From bed;From chair/3-in-1   Sit to Stand Details Verbal cues for technique;Verbal cues for sequencing;Verbal cues for precautions/safety;Manual facilitation for weight shifting   Sit to Stand Details (indicate cue type and reason) cues for anterior weight shifting, to scoot closer to edge prior to standing and for hand  placement   Stand to Sit 4: Min guard;With upper extremity assist;With armrests;To bed;To chair/3-in-1;Uncontrolled descent   Stand to Sit Details (indicate cue type and reason) Verbal cues for sequencing;Verbal cues for technique;Verbal cues for precautions/safety   Stand to Sit Details cues to reach back and use UE's to control descent with sitting      Exercise:  Functional Quadriceps: Sit to Stand    Sit on edge of chair facing kitchen counter top, feet flat on floor. Stand upright, extending knees fully. Use armrests on chair with standing up and to slow you down with sitting. Use counter top to catch balance as needed. Repeat _10_ times per set. Do _1 sets per session. Do _1-2_ sessions per day.  http://orth.exer.us/735     Copyright  VHI. All rights reserved.  Toe / Heel Raise (Standing)    Standing with support at kitchen sink, raise heels, then rock back on heels and raise toes. Repeat __10_ times.  Copyright  VHI. All rights reserved.    Hip Side Kick    Holding onto kitchen counter/sink for balance, keep legs shoulder width apart and toes pointed forward. Swing a leg out to side, keeping knee straight. Do not lean. Repeat using other leg, alternating legs.  Repeat __10__ times. Do __1-2__ sessions per day.  http://gt2.exer.us/343   Copyright  VHI. All rights reserved.   Side-Stepping    At kitchen counter top: Walk to left side with eyes open. Take even steps, leading with same foot. Make sure each foot lifts off the floor. Repeat in opposite direction (toward right side). Repeat for 3 laps each way. Do __1-2__ sessions per day.   Copyright  VHI. All rights reserved.        PT Education - 11/04/15 2026    Education provided Yes   Education Details HEP for strengthening   Person(s) Educated Patient;Child(ren)   Methods Explanation;Demonstration;Verbal cues   Comprehension Verbalized understanding;Returned demonstration;Verbal cues required           PT Short Term Goals - 11/04/15 2033    PT SHORT TERM GOAL #1   Title Pt will initiate HEP for strengthening and balance in order to improve functional mobility.  (Target Date: 11/28/15)   Status New   PT SHORT TERM GOAL #2   Title Pt will improve TUG score to <80 seconds in order to indicate decreased fall risk.     Status New   PT SHORT TERM GOAL #3   Title Will assess BERG and improve score by 4 points in order to indicate decreased fall risk and functional improvement in balance.     Baseline 11/04/15: 12/56 baseline Berg Balance test score   Status New   PT SHORT TERM GOAL #4   Title Pt will ambulate over indoor surfaces  x 150' w/ RW at S level in order to indicate safe negotiation in home.     PT SHORT TERM GOAL #5   Title Pt will verbalize fall prevention strategies in order to indicate decreased fall risk inside and outside of home.     PT SHORT TERM GOAL #6   Title Pt will perform 8/10 sit <>stands with single UE support only at mod I level in order to indicate improved functional strength.             PT Long Term Goals - 11/04/15 2033    PT LONG TERM GOAL #1   Title Pt will be independent with HEP in order to indicate improved functional mobility and decreased fall risk.  (Target Date: 12/26/15)   Status New   PT LONG TERM GOAL #2   Title Pt will improve TUG score to <40 secs in order to indicate decreased fall risk.     Status New   PT LONG TERM GOAL #3   Title Pt will improve BERG balance score 8 points from baseline in order to indicate decreased fall risk.     Baseline 11/04/15: baseline Berg balance score is 12/56   Status New   PT LONG TERM GOAL #4   Title Pt will perform 8/10 sit<>stand without UE support at mod I level in order to indicate improved functional strength.     Status New   PT LONG TERM GOAL #5   Title Pt will ambulate 300' w/ LRAD over paved outdoor surfaces at mod I level in order to indicate safe return to community.     PT LONG TERM GOAL  #6   Title Pt will negotiate up/down 5 steps w/ single rail at mod I level in order to indicate safe entry into home/family's homes.     Status New            Plan - 11/04/15 1456    Clinical Impression Statement Berg balance test performed today to establish pt's baseline with score of 12/56. Also issued HEP for strengthening today with no issues reported in session.    Rehab Potential Excellent   PT Frequency 2x / week   PT Duration 8 weeks   PT Treatment/Interventions ADLs/Self Care Home Management;Electrical Stimulation;DME Instruction;Gait training;Stair training;Functional mobility training;Therapeutic activities;Therapeutic exercise;Balance training;Neuromuscular re-education;Patient/family education;Orthotic Fit/Training;Vestibular;Visual/perceptual remediation/compensation   PT Next Visit Plan review HEP issued today;add single leg stance, tandem stance and feet together with EO/EC in corner to HEP; work on gait with walker   Consulted and Agree with Plan of Care Patient;Family member/caregiver   Family Member Consulted daughter [name]      Patient will benefit from skilled therapeutic intervention in order to improve the following deficits and impairments:  Abnormal gait, Decreased activity tolerance, Decreased balance, Decreased coordination, Decreased endurance, Decreased knowledge of use of DME, Decreased mobility, Decreased strength, Dizziness, Increased edema, Impaired perceived functional ability, Impaired flexibility, Impaired UE functional use, Impaired vision/preception, Improper body mechanics, Postural dysfunction  Visit Diagnosis: Unsteadiness on feet  Other abnormalities of gait and mobility  Muscle weakness (generalized)     Problem List Patient Active Problem List   Diagnosis Date Noted  . Cerebral thrombosis with cerebral infarction 09/07/2015  . Lacunar infarct, acute ([place])   . Nausea & vomiting 09/05/2015  . Acute kidney injury ([place]) 09/04/2015  .  Type II diabetes mellitus with neurological manifestations, uncontrolled ([place]) 09/03/2015  . Bilateral lower extremity edema 09/03/2015  . Essential hypertension   . Diabetes  mellitus type 2 in nonobese ([place])   . History of breast cancer   . AKI (acute kidney injury) ([place])   . Acute blood loss anemia   . Hemiparesis ([place])   . Gait disturbance, post-stroke   . Dysphagia, post-stroke   . Acute ischemic stroke/right cerebellar 08/24/2015  . Breast cancer of upper-outer quadrant of right female breast ([place]) 04/30/2015  . CVA (cerebral infarction) 07/05/2014  . Hyperlipidemia 07/05/2014  . Syncope 07/04/2014  . Mild mitral regurgitation by prior echocardiogram 12/20/2011  . Diabetes mellitus type 1, uncontrolled, insulin dependent ([place]) 02/26/2009  . OBESITY 02/26/2009  . Uncontrolled hypertension 02/26/2009  . CAD (coronary artery disease) 02/26/2009  . GASTROESOPHAGEAL REFLUX DISEASE 02/26/2009    Willow Ora, PTA, [facility] [address], [place], [place] [address] [phone] 11/04/2015, 8:34 PM   Name: Glenda [name] MRN: [id number] Date of Birth: February 28, 1934

## 2015-11-04 NOTE — Patient Instructions (Signed)
Functional Quadriceps: Sit to Stand    Sit on edge of chair facing kitchen counter top, feet flat on floor. Stand upright, extending knees fully. Use armrests on chair with standing up and to slow you down with sitting. Use counter top to catch balance as needed. Repeat _10_ times per set. Do _1 sets per session. Do _1-2_ sessions per day.  http://orth.exer.us/735     Copyright  VHI. All rights reserved.  Toe / Heel Raise (Standing)    Standing with support at kitchen sink, raise heels, then rock back on heels and raise toes. Repeat __10_ times.  Copyright  VHI. All rights reserved.    Hip Side Kick    Holding onto kitchen counter/sink for balance, keep legs shoulder width apart and toes pointed forward. Swing a leg out to side, keeping knee straight. Do not lean. Repeat using other leg, alternating legs.  Repeat __10__ times. Do __1-2__ sessions per day.  http://gt2.exer.us/343   Copyright  VHI. All rights reserved.   Side-Stepping    At kitchen counter top: Walk to left side with eyes open. Take even steps, leading with same foot. Make sure each foot lifts off the floor. Repeat in opposite direction (toward right side). Repeat for 3 laps each way. Do __1-2__ sessions per day.   Copyright  VHI. All rights reserved.

## 2015-11-05 ENCOUNTER — Ambulatory Visit: Payer: Medicare Other | Admitting: Occupational Therapy

## 2015-11-05 ENCOUNTER — Ambulatory Visit: Payer: Medicare Other | Admitting: Physical Therapy

## 2015-11-05 DIAGNOSIS — R278 Other lack of coordination: Secondary | ICD-10-CM

## 2015-11-05 DIAGNOSIS — M25611 Stiffness of right shoulder, not elsewhere classified: Secondary | ICD-10-CM

## 2015-11-05 DIAGNOSIS — M6281 Muscle weakness (generalized): Secondary | ICD-10-CM

## 2015-11-05 DIAGNOSIS — R2681 Unsteadiness on feet: Secondary | ICD-10-CM

## 2015-11-05 DIAGNOSIS — R2689 Other abnormalities of gait and mobility: Secondary | ICD-10-CM

## 2015-11-05 DIAGNOSIS — R41844 Frontal lobe and executive function deficit: Secondary | ICD-10-CM

## 2015-11-06 NOTE — Therapy (Signed)
[facility] [address] [place], [place], [address] Phone: [phone]   Fax:  [phone]  Occupational Therapy Evaluation  Patient Details  Name: Glenda [name] MRN: [id number] Date of Birth: Apr 13, 1934 Referring Provider: Dr. Donnie Coffin  Encounter Date: 11/05/2015      OT End of Session - 11/06/15 1217    Visit Number 1   Number of Visits 17   Date for OT Re-Evaluation 01/04/16   Authorization Type UHC MCR   Authorization - Visit Number 1   Authorization - Number of Visits 10   OT Start Time [id number]   OT Stop Time 1530   OT Time Calculation (min) 38 min   Activity Tolerance Patient tolerated treatment well   Behavior During Therapy [facility] for tasks assessed/performed      Past Medical History  Diagnosis Date  . GERD (gastroesophageal reflux disease)   . Obesity   . DM (diabetes mellitus) ([place])   . HTN (hypertension)   . HLD (hyperlipidemia)   . CAD (coronary artery disease)   . UTI (urinary tract infection)   . Chest pain   . Dizziness   . Breast cancer ([place])   . Arthritis   . Stroke ([place])   . Shortness of breath dyspnea     Past Surgical History  Procedure Laterality Date  . Cholecystectomy    . Colon polyps; hx    . Coronary angioplasty  02/14/09  . Radioactive seed guided mastectomy with axillary sentinel lymph node biopsy Right 07/17/2015    Procedure: RADIOACTIVE SEED GUIDED PARTIAL MASTECTOMY WITH AXILLARY SENTINEL LYMPH NODE BIOPSY;  Surgeon: Erroll Luna, MD;  Location: [place];  Service: General;  Laterality: Right;  . Esophagogastroduodenoscopy (egd) with propofol Left 09/08/2015    Procedure: ESOPHAGOGASTRODUODENOSCOPY (EGD) WITH PROPOFOL;  Surgeon: Clarene Essex, MD;  Location: [facility] ENDOSCOPY;  Service: Endoscopy;  Laterality: Left;    There were no vitals filed for this visit.      Subjective Assessment - 11/06/15 1258    Subjective  Pt s/p CVA on 08/24/15 presents with right side  weakness and coordination deficits.   Pertinent History see Epic   Patient Stated Goals To do everything I did before   Currently in Pain? No/denies           [facility] OT Assessment - 11/06/15 0001    Assessment   Diagnosis CVA   Referring Provider Dr. Donnie Coffin   Onset Date 08/24/15   Prior Therapy [facility]   Precautions   Precautions [place] expects to be discharged to: Private residence   Lives With Daughter   Prior Function   Level of Independence Independent, pt drove beforehand but she does not plan to return to driving.   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   Mobility   Mobility Status Needs assist  min A with walker   Written Expression   Dominant Hand Right   Handwriting --  80% legibile  Vision Assessment   Vision Assessment Vision not tested   Cognition   Overall Cognitive Status Impaired/Different from baseline   Area of Impairment --  increased difficulty with multi tasking, slower processing   Attention Selective   Sensation   Light Touch Appears Intact   Coordination   Gross Motor Movements are Fluid and Coordinated No   Fine Motor Movements are Fluid and Coordinated No   9 Hole Peg Test Right;Left   Right 9 Hole Peg Test 59.0 secs   Left 9 Hole Peg Test 42.40 secs   Box and Blocks RUE 26 blocks, LUE 35 blocks   ROM / Strength   AROM / PROM / Strength AROM   AROM   Overall AROM  Deficits   Overall AROM Comments right shoulder flexion 95, abduct 75  mild limitation in supination   Strength   Overall Strength Deficits   Hand Function   Right Hand Grip (lbs) 35 lbs   Left Hand Grip (lbs) 50 lbs                           OT Short Term Goals - 11/06/15 1243    OT SHORT TERM GOAL #1   Title I with inital HEP.   Time 4   Period Weeks   Status New   OT SHORT TERM GOAL #2   Title Pt will demonstrate ability to retrieve a lightweight object at 110* with RUE.   Time 4   Period Weeks   Status New   OT SHORT TERM GOAL #3   Title Pt will demonstrate improved fine motor coordination as evidenced by decreasing 9 hole peg test score by 4 secs bilaterally.   Baseline RUE 59 secs, LUE 42.40 secs   Time 4   Period Weeks   Status New   OT SHORT TERM GOAL #4   Title Pt will perform simple snack prep or light home management task in standing with min A without LOB demonstrating good safety awareness.   Time 4   Period Weeks   Status New   OT SHORT TERM GOAL #5   Title Pt will demonstrate ability to write a short paragraph with 95% legibility.   Time 4   Period Weeks   Status New   Additional Short Term Goals   Additional Short Term Goals Yes   OT SHORT TERM GOAL #6   Title Further assess cognition and set goal PRN   Time 4   Period Weeks   Status New           OT Long Term Goals - 11/06/15 1247    OT LONG TERM GOAL #1   Title I with updated HEP.   Time 8   Period Weeks   Status New   OT LONG TERM GOAL #2   Title Pt will perform simple cooking task in standing with supervision demonstrating good safety awareness.   Time 8   Period Weeks   Status New   OT LONG TERM GOAL #3   Title Pt will demonstrate ability to retrieve a light weight object at 120 shoulder flexion with RUE   Time 8   Period Weeks   Status New   OT LONG TERM GOAL #4   Title Pt will resume use of RUE as dominant hand at least 75% of the time for ADLs/IADLs.   Time 8   Period Weeks   Status New   OT LONG TERM  GOAL #5   Title Pt  will decrease RUE 9 hole peg test score to 49 secs from for increased fine motor coordination.   Baseline RUE 59 secs   Time 8   Long Term Additional Goals   Additional Long Term Goals Yes   OT LONG TERM GOAL #6   Title Pt will be modified indpendent with all basic ADLs.   Time 8   Period Weeks   Status New               Plan - 18-Nov-2015 1218    Clinical Impression Statement Pt  with past medical hx of DM, breast CA and HTN, s/p CVA on 08/24/15 presnts with right sided weakness, decreased coordination, cognitive deficits and and decreased balance which impedes performance of ADLs/IADLS.Pt can beneift from skilled occupational therapy to maximize safety and independence with ADL/IADLS.   Rehab Potential Good   OT Frequency 2x / week  plus eval   OT Duration 8 weeks   Plan inital HEP for bilateral coordination, then HEP for RUE A/ROM   Consulted and Agree with Plan of Care Patient;Family member/caregiver   Family Member Consulted daughter Di Kindle      Patient will benefit from skilled therapeutic intervention in order to improve the following deficits and impairments:  Abnormal gait, Decreased coordination, Decreased range of motion, Difficulty walking, Decreased safety awareness, Decreased endurance, Decreased activity tolerance, Decreased knowledge of precautions, Impaired UE functional use, Decreased knowledge of use of DME, Decreased balance, Decreased cognition, Decreased mobility, Decreased strength, Impaired perceived functional ability  Visit Diagnosis: Shoulder stiffness, right - Plan: Ot plan of care cert/re-cert  Muscle weakness (generalized) - Plan: Ot plan of care cert/re-cert  Unsteadiness on feet - Plan: Ot plan of care cert/re-cert  Other abnormalities of gait and mobility - Plan: Ot plan of care cert/re-cert  Other lack of coordination - Plan: Ot plan of care cert/re-cert  Frontal lobe and executive function deficit - Plan: Ot plan of care cert/re-cert       G-Codes - 18-Nov-2015 1241    Functional Assessment Tool Used 9 hole peg test RUE 59.0, LUE 42.40, clinical impressions, Pt is min-supervsion with basic ADLS, dependent for IADLs.   Functional Limitation Self care   Self Care Current Status [phone]) At least 40 percent but less than 60 percent impaired, limited or restricted   Self Care Goal Status [id number]) At least 1 percent but less than 20 percent impaired, limited or restricted      Problem List Patient Active Problem List   Diagnosis Date Noted  . Cerebral thrombosis with cerebral infarction 09/07/2015  . Lacunar infarct, acute ([place])   . Nausea & vomiting 09/05/2015  . Acute kidney injury ([place]) 09/04/2015  . Type II diabetes mellitus with neurological manifestations, uncontrolled ([place]) 09/03/2015  . Bilateral lower extremity edema 09/03/2015  . Essential hypertension   . Diabetes mellitus type 2 in nonobese ([facility])   . History of breast cancer   . AKI (acute kidney injury) ([place])   . Acute blood loss anemia   . Hemiparesis ([place])   . Gait disturbance, post-stroke   . Dysphagia, post-stroke   . Acute ischemic stroke/right cerebellar 08/24/2015  . Breast cancer of upper-outer quadrant of right female breast ([place]) 04/30/2015  . CVA (cerebral infarction) 07/05/2014  . Hyperlipidemia 07/05/2014  . Syncope 07/04/2014  . Mild mitral regurgitation by prior echocardiogram 12/20/2011  . Diabetes mellitus type 1, uncontrolled, insulin dependent ([place]) 02/26/2009  . OBESITY 02/26/2009  .  Uncontrolled hypertension 02/26/2009  . CAD (coronary artery disease) 02/26/2009  . GASTROESOPHAGEAL REFLUX DISEASE 02/26/2009    RINE,KATHRYN 11/06/2015, 12:58 PM Theone Murdoch, OTR/L [phone] Phone: [phone] 12:58 PM 11/06/2015 [facility] [address] [place], [place], [address] Phone: [phone]   Fax:  [phone]  Name: Sherren Lemos MRN: [id number] Date of Birth:  08-Jan-1934

## 2015-11-07 NOTE — Therapy (Signed)
[facility] [address] [place], [place], [address] Phone: [phone]   Fax:  [phone]  Physical Therapy Treatment  Patient Details  Name: Glenda [name] MRN: [id number] Date of Birth: [birth year] Referring Provider: Donnie Coffin, MD  Encounter Date: 11/05/2015   11/05/15 1241  PT Visits / Re-Eval  Visit Number 3  Number of Visits 17  Date for PT Re-Evaluation 12/30/15  Authorization  Authorization Type UHC MCR- G code needed every 10th visit  PT Time Calculation  PT Start Time 1235  PT Stop Time 1315  PT Time Calculation (min) 40 min  PT - End of Session  Equipment Utilized During Treatment Gait belt  Activity Tolerance Patient limited by fatigue  Behavior During Therapy [facility] for tasks assessed/performed     Past Medical History  Diagnosis Date  . GERD (gastroesophageal reflux disease)   . Obesity   . DM (diabetes mellitus) ([place])   . HTN (hypertension)   . HLD (hyperlipidemia)   . CAD (coronary artery disease)   . UTI (urinary tract infection)   . Chest pain   . Dizziness   . Breast cancer ([place])   . Arthritis   . Stroke ([place])   . Shortness of breath dyspnea     Past Surgical History  Procedure Laterality Date  . Cholecystectomy    . Colon polyps; hx    . Coronary angioplasty  02/14/09  . Radioactive seed guided mastectomy with axillary sentinel lymph node biopsy Right 07/17/2015    Procedure: RADIOACTIVE SEED GUIDED PARTIAL MASTECTOMY WITH AXILLARY SENTINEL LYMPH NODE BIOPSY;  Surgeon: Erroll Luna, MD;  Location: [facility];  Service: General;  Laterality: Right;  . Esophagogastroduodenoscopy (egd) with propofol Left 09/08/2015    Procedure: ESOPHAGOGASTRODUODENOSCOPY (EGD) WITH PROPOFOL;  Surgeon: Clarene Essex, MD;  Location: [facility] ENDOSCOPY;  Service: Endoscopy;  Laterality: Left;    There were no vitals filed for this visit.     11/05/15 1239  Symptoms/Limitations  Subjective Reports she was  very tired after yesterday. Reports she had very stiff leg this morning, "it just would not do anything". No falls, near fall this am when right leg was stiff.  Patient is accompained by: Family member (daughter, [name])  Limitations Walking;House hold activities  Patient Stated Goals "I want to work on walking"   Pain Assessment  Currently in Pain? No/denies  Pain Score 0      11/05/15 1241  Bed Mobility  Rolling Right 4: Min guard  Rolling Left 4: Min guard  Right Sidelying to Sit 4: Min guard;HOB flat  Sit to Sidelying Right 4: Min guard;HOB flat  Transfers  Transfers Sit to Stand;Stand to Sit  Sit to Stand 4: Min guard;4: Min assist;With upper extremity assist;From bed;From chair/3-in-1  Sit to Stand Details Verbal cues for technique;Verbal cues for sequencing;Verbal cues for precautions/safety;Manual facilitation for weight shifting  Sit to Stand Details (indicate cue type and reason) progressed to min guard assist over course of session  Stand to Sit 4: Min guard;4: Min assist;With upper extremity assist;With armrests;To bed;To chair/3-in-1  Stand to Sit Details (indicate cue type and reason) Verbal cues for sequencing;Verbal cues for technique;Verbal cues for precautions/safety  Stand to Sit Details cues for hand placement and use of arms to control descent with sitting  Ambulation/Gait  Ambulation/Gait Yes  Ambulation/Gait Assistance 4: Min assist  Ambulation/Gait Assistance Details cues on posture, step length and for increased stride length.  Ambulation Distance (Feet) 80 Feet (x2 reps)  Assistive device  Rolling walker  Gait Pattern Step-through pattern;Decreased stride length;Decreased stance time - right;Decreased step length - right;Decreased step length - left;Decreased weight shift to right;Decreased hip/knee flexion - right;Narrow base of support  Ambulation Surface Level;Indoor  Knee/Hip Exercises: Standing  Other Standing Knee Exercises sit<>stand with UE support,  cues on form and technique, anterior weight shifting and for coming up into full upright standing.  Knee/Hip Exercises: Supine  Heel Slides AROM;Strengthening;Right;1 set;10 reps;Limitations  Heel Slides Limitations manual resistance with support for stability at knee  Bridges AROM;Strengthening;Both;2 sets;10 reps  Bridges Limitations cues on form and technique  Straight Leg Raises AAROM;AROM;Strengthening;Right;2 sets;10 reps  Straight Leg Raises Limitations cues on form and technique           PT Short Term Goals - 11/04/15 2033    PT SHORT TERM GOAL #1   Title Pt will initiate HEP for strengthening and balance in order to improve functional mobility.  (Target Date: 11/28/15)   Status New   PT SHORT TERM GOAL #2   Title Pt will improve TUG score to <80 seconds in order to indicate decreased fall risk.     Status New   PT SHORT TERM GOAL #3   Title Will assess BERG and improve score by 4 points in order to indicate decreased fall risk and functional improvement in balance.     Baseline 11/04/15: 12/56 baseline Berg Balance test score   Status New   PT SHORT TERM GOAL #4   Title Pt will ambulate over indoor surfaces x 150' w/ RW at S level in order to indicate safe negotiation in home.     PT SHORT TERM GOAL #5   Title Pt will verbalize fall prevention strategies in order to indicate decreased fall risk inside and outside of home.     PT SHORT TERM GOAL #6   Title Pt will perform 8/10 sit <>stands with single UE support only at mod I level in order to indicate improved functional strength.             PT Long Term Goals - 11/04/15 2033    PT LONG TERM GOAL #1   Title Pt will be independent with HEP in order to indicate improved functional mobility and decreased fall risk.  (Target Date: 12/26/15)   Status New   PT LONG TERM GOAL #2   Title Pt will improve TUG score to <40 secs in order to indicate decreased fall risk.     Status New   PT LONG TERM GOAL #3   Title Pt will  improve BERG balance score 8 points from baseline in order to indicate decreased fall risk.     Baseline 11/04/15: baseline Berg balance score is 12/56   Status New   PT LONG TERM GOAL #4   Title Pt will perform 8/10 sit<>stand without UE support at mod I level in order to indicate improved functional strength.     Status New   PT LONG TERM GOAL #5   Title Pt will ambulate 300' w/ LRAD over paved outdoor surfaces at mod I level in order to indicate safe return to community.     PT LONG TERM GOAL #6   Title Pt will negotiate up/down 5 steps w/ single rail at mod I level in order to indicate safe entry into home/family's homes.     Status New      11/05/15 1241  Plan  Clinical Impression Statement today's session focused on general mobility, gait and strengthening  with no complaints reported. Pt is making steady progress toward goals, continues to fatigue quickly.   Pt will benefit from skilled therapeutic intervention in order to improve on the following deficits Abnormal gait;Decreased activity tolerance;Decreased balance;Decreased coordination;Decreased endurance;Decreased knowledge of use of DME;Decreased mobility;Decreased strength;Dizziness;Increased edema;Impaired perceived functional ability;Impaired flexibility;Impaired UE functional use;Impaired vision/preception;Improper body mechanics;Postural dysfunction  Rehab Potential Excellent  PT Frequency 2x / week  PT Duration 8 weeks  PT Treatment/Interventions ADLs/Self Care Home Management;Electrical Stimulation;DME Instruction;Gait training;Stair training;Functional mobility training;Therapeutic activities;Therapeutic exercise;Balance training;Neuromuscular re-education;Patient/family education;Orthotic Fit/Training;Vestibular;Visual/perceptual remediation/compensation  PT Next Visit Plan add single leg stance, tandem stance and feet together with EO/EC in corner to HEP; work on gait with walker  Consulted and Agree with Plan of Care  Patient;Family member/caregiver  Family Member Consulted daughter [name]       Patient will benefit from skilled therapeutic intervention in order to improve the following deficits and impairments:  Abnormal gait, Decreased activity tolerance, Decreased balance, Decreased coordination, Decreased endurance, Decreased knowledge of use of DME, Decreased mobility, Decreased strength, Dizziness, Increased edema, Impaired perceived functional ability, Impaired flexibility, Impaired UE functional use, Impaired vision/preception, Improper body mechanics, Postural dysfunction  Visit Diagnosis: Muscle weakness (generalized)  Unsteadiness on feet  Other abnormalities of gait and mobility  Other lack of coordination     Problem List Patient Active Problem List   Diagnosis Date Noted  . Cerebral thrombosis with cerebral infarction 09/07/2015  . Lacunar infarct, acute ([place])   . Nausea & vomiting 09/05/2015  . Acute kidney injury ([place]) 09/04/2015  . Type II diabetes mellitus with neurological manifestations, uncontrolled ([place]) 09/03/2015  . Bilateral lower extremity edema 09/03/2015  . Essential hypertension   . Diabetes mellitus type 2 in nonobese ([facility])   . History of breast cancer   . AKI (acute kidney injury) ([place])   . Acute blood loss anemia   . Hemiparesis ([place])   . Gait disturbance, post-stroke   . Dysphagia, post-stroke   . Acute ischemic stroke/right cerebellar 08/24/2015  . Breast cancer of upper-outer quadrant of right female breast ([place]) 04/30/2015  . CVA (cerebral infarction) 07/05/2014  . Hyperlipidemia 07/05/2014  . Syncope 07/04/2014  . Mild mitral regurgitation by prior echocardiogram 12/20/2011  . Diabetes mellitus type 1, uncontrolled, insulin dependent ([place]) 02/26/2009  . OBESITY 02/26/2009  . Uncontrolled hypertension 02/26/2009  . CAD (coronary artery disease) 02/26/2009  . GASTROESOPHAGEAL REFLUX DISEASE 02/26/2009   Willow Ora, PTA, [facility] [address], [place], [place] [address] [phone] 11/09/2015, 6:38 PM  Name: Secret Cradle MRN: [id number] Date of Birth: 22-Jun-1933

## 2015-11-10 ENCOUNTER — Encounter: Payer: Self-pay | Admitting: Physical Therapy

## 2015-11-10 ENCOUNTER — Ambulatory Visit: Payer: Medicare Other | Admitting: Occupational Therapy

## 2015-11-10 ENCOUNTER — Ambulatory Visit: Payer: Medicare Other | Admitting: Physical Therapy

## 2015-11-10 ENCOUNTER — Ambulatory Visit: Payer: Self-pay | Admitting: Neurology

## 2015-11-10 VITALS — BP 175/62 | HR 58

## 2015-11-10 DIAGNOSIS — M6281 Muscle weakness (generalized): Secondary | ICD-10-CM

## 2015-11-10 DIAGNOSIS — R278 Other lack of coordination: Secondary | ICD-10-CM

## 2015-11-10 DIAGNOSIS — R2681 Unsteadiness on feet: Secondary | ICD-10-CM

## 2015-11-10 DIAGNOSIS — R2689 Other abnormalities of gait and mobility: Secondary | ICD-10-CM

## 2015-11-10 NOTE — Therapy (Signed)
[facility] [address] [place], [place], [address] Phone: [phone]   Fax:  [phone]  Occupational Therapy Treatment  Patient Details  Name: Glenda [name] MRN: [id number] Date of Birth: [birth year] Referring Provider: Dr. Donnie Coffin  Encounter Date: 11/10/2015      OT End of Session - 11/10/15 1012    Visit Number 2   Number of Visits 17   Date for OT Re-Evaluation 01/04/16   Authorization Type UHC MCR   Authorization - Visit Number 2   Authorization - Number of Visits 10   OT Start Time 0930   OT Stop Time 1015   OT Time Calculation (min) 45 min   Activity Tolerance Patient tolerated treatment well      Past Medical History  Diagnosis Date  . GERD (gastroesophageal reflux disease)   . Obesity   . DM (diabetes mellitus) ([place])   . HTN (hypertension)   . HLD (hyperlipidemia)   . CAD (coronary artery disease)   . UTI (urinary tract infection)   . Chest pain   . Dizziness   . Breast cancer ([place])   . Arthritis   . Stroke ([place])   . Shortness of breath dyspnea     Past Surgical History  Procedure Laterality Date  . Cholecystectomy    . Colon polyps; hx    . Coronary angioplasty  02/14/09  . Radioactive seed guided mastectomy with axillary sentinel lymph node biopsy Right 07/17/2015    Procedure: RADIOACTIVE SEED GUIDED PARTIAL MASTECTOMY WITH AXILLARY SENTINEL LYMPH NODE BIOPSY;  Surgeon: Erroll Luna, MD;  Location: [facility];  Service: General;  Laterality: Right;  . Esophagogastroduodenoscopy (egd) with propofol Left 09/08/2015    Procedure: ESOPHAGOGASTRODUODENOSCOPY (EGD) WITH PROPOFOL;  Surgeon: Clarene Essex, MD;  Location[facility] ENDOSCOPY;  Service: Endoscopy;  Laterality: Left;    There were no vitals filed for this visit.      Subjective Assessment - 11/10/15 0937    Subjective  Pt s/p CVA on 08/24/15 presents with right side weakness and coordination deficits.   Pertinent History see Epic    Patient Stated Goals To do everything I did before   Currently in Pain? No/denies                      OT Treatments/Exercises (OP) - 11/10/15 1010    Exercises   Exercises Shoulder;Hand   Shoulder Exercises: ROM/Strengthening   Other ROM/Strengthening Exercises Cane HEP issued: pt return demo of each x 10 reps - see pt instructions for details.    Other ROM/Strengthening Exercises UBE x 5 min. Level 1 resistance for strength/endurance BUE's   Fine Motor Coordination   Other Fine Motor Exercises Issued coordination HEP - pt demo each. See pt instructions for details                OT Education - 11/10/15 1006    Education provided Yes   Education Details Coordination HEP, UE ROM HEP    Person(s) Educated Patient   Methods Explanation;Demonstration;Handout   Comprehension Verbalized understanding;Returned demonstration          OT Short Term Goals - 11/06/15 1243    OT SHORT TERM GOAL #1   Title I with inital HEP.   Time 4   Period Weeks   Status New   OT SHORT TERM GOAL #2   Title Pt will demonstrate ability to retrieve a lightweight object at 110* with RUE.   Time 4  Period Weeks   Status New   OT SHORT TERM GOAL #3   Title Pt will demonstrate improved fine motor coordination as evidenced by decreasing 9 hole peg test score by 4 secs bilaterally.   Baseline RUE 59 secs, LUE 42.40 secs   Time 4   Period Weeks   Status New   OT SHORT TERM GOAL #4   Title Pt will perform simple snack prep or light home management task in standing with min A without LOB demonstrating good safety awareness.   Time 4   Period Weeks   Status New   OT SHORT TERM GOAL #5   Title Pt will demonstrate ability to write a short paragraph with 95% legibility.   Time 4   Period Weeks   Status New   Additional Short Term Goals   Additional Short Term Goals Yes   OT SHORT TERM GOAL #6   Title Further assess cognition and set goal PRN   Time 4   Period Weeks    Status New           OT Long Term Goals - 11/06/15 1247    OT LONG TERM GOAL #1   Title I with updated HEP.   Time 8   Period Weeks   Status New   OT LONG TERM GOAL #2   Title Pt will perform simple cooking task in standing with supervision demonstrating good safety awareness.   Time 8   Period Weeks   Status New   OT LONG TERM GOAL #3   Title Pt will demonstrate ability to retrieve a light weight object at 120 shoulder flexion with RUE   Time 8   Period Weeks   Status New   OT LONG TERM GOAL #4   Title Pt will resume use of RUE as dominant hand at least 75% of the time for ADLs/IADLs.   Time 8   Period Weeks   Status New   OT LONG TERM GOAL #5   Title Pt will decrease RUE 9 hole peg test score to 49 secs from for increased fine motor coordination.   Baseline RUE 59 secs   Time 8   Long Term Additional Goals   Additional Long Term Goals Yes   OT LONG TERM GOAL #6   Title Pt will be modified indpendent with all basic ADLs.   Time 8   Period Weeks   Status New               Plan - 11/10/15 1012    Clinical Impression Statement Pt progressing with RUE during coordination activities.    OT Frequency 2x / week   OT Duration 8 weeks   OT Treatment/Interventions Self-care/ADL training;Therapeutic exercise;Patient/family education;Functional Mobility Training;Neuromuscular education;Manual Therapy;Therapeutic exercises;DME and/or AE instruction;Therapeutic activities;Electrical Stimulation;Fluidtherapy;Cognitive remediation/compensation;Moist Heat;Passive range of motion;Visual/perceptual remediation/compensation   Plan review HEP, functional use of RUE, coordination, writing   Consulted and Agree with Plan of Care Patient;Family member/caregiver   Family Member Consulted daughter [name]      Patient will benefit from skilled therapeutic intervention in order to improve the following deficits and impairments:  Abnormal gait, Decreased coordination, Decreased range  of motion, Difficulty walking, Decreased safety awareness, Decreased endurance, Decreased activity tolerance, Decreased knowledge of precautions, Impaired UE functional use, Decreased knowledge of use of DME, Decreased balance, Decreased cognition, Decreased mobility, Decreased strength, Impaired perceived functional ability  Visit Diagnosis: Muscle weakness (generalized)  Other lack of coordination    Problem List Patient Active  Problem List   Diagnosis Date Noted  . Cerebral thrombosis with cerebral infarction 09/07/2015  . Lacunar infarct, acute ([place])   . Nausea & vomiting 09/05/2015  . Acute kidney injury ([place]) 09/04/2015  . Type II diabetes mellitus with neurological manifestations, uncontrolled ([place]) 09/03/2015  . Bilateral lower extremity edema 09/03/2015  . Essential hypertension   . Diabetes mellitus type 2 in nonobese ([facility])   . History of breast cancer   . AKI (acute kidney injury) ([place])   . Acute blood loss anemia   . Hemiparesis ([place])   . Gait disturbance, post-stroke   . Dysphagia, post-stroke   . Acute ischemic stroke/right cerebellar 08/24/2015  . Breast cancer of upper-outer quadrant of right female breast ([place]) 04/30/2015  . CVA (cerebral infarction) 07/05/2014  . Hyperlipidemia 07/05/2014  . Syncope 07/04/2014  . Mild mitral regurgitation by prior echocardiogram 12/20/2011  . Diabetes mellitus type 1, uncontrolled, insulin dependent ([place]) 02/26/2009  . OBESITY 02/26/2009  . Uncontrolled hypertension 02/26/2009  . CAD (coronary artery disease) 02/26/2009  . GASTROESOPHAGEAL REFLUX DISEASE 02/26/2009    Carey Bullocks, OTR/L 11/10/2015, 10:17 AM  [facility] [address] [place], [place], [address] Phone: [phone]   Fax:  [phone]  Name: Glenda [name] MRN: [id number] Date of Birth: 1933-12-07

## 2015-11-10 NOTE — Therapy (Signed)
[facility] [address] [place], [place], [address] Phone: [phone]   Fax:  [phone]  Physical Therapy Treatment  Patient Details  Name: Glenda [name] MRN: [id number] Date of Birth: [birth year] Referring Provider: Donnie Coffin, MD  Encounter Date: 11/10/2015      PT End of Session - 11/10/15 0851    Visit Number 4   Number of Visits 17   Date for PT Re-Evaluation 12/30/15   Authorization Type UHC MCR- G code needed every 10th visit   PT Start Time 0849   PT Stop Time 0930   PT Time Calculation (min) 41 min   Equipment Utilized During Treatment Gait belt   Activity Tolerance Patient limited by fatigue   Behavior During Therapy [facility] for tasks assessed/performed      Past Medical History  Diagnosis Date  . GERD (gastroesophageal reflux disease)   . Obesity   . DM (diabetes mellitus) ([place])   . HTN (hypertension)   . HLD (hyperlipidemia)   . CAD (coronary artery disease)   . UTI (urinary tract infection)   . Chest pain   . Dizziness   . Breast cancer ([place])   . Arthritis   . Stroke ([place])   . Shortness of breath dyspnea     Past Surgical History  Procedure Laterality Date  . Cholecystectomy    . Colon polyps; hx    . Coronary angioplasty  02/14/09  . Radioactive seed guided mastectomy with axillary sentinel lymph node biopsy Right 07/17/2015    Procedure: RADIOACTIVE SEED GUIDED PARTIAL MASTECTOMY WITH AXILLARY SENTINEL LYMPH NODE BIOPSY;  Surgeon: Erroll Luna, MD;  Location: [place];  Service: General;  Laterality: Right;  . Esophagogastroduodenoscopy (egd) with propofol Left 09/08/2015    Procedure: ESOPHAGOGASTRODUODENOSCOPY (EGD) WITH PROPOFOL;  Surgeon: Clarene Essex, MD;  Location: [facility] ENDOSCOPY;  Service: Endoscopy;  Laterality: Left;    Filed Vitals:   11/10/15 0853  BP: 175/62  Pulse: 58        Subjective Assessment - 11/10/15 0853    Subjective Reports she is feeling dizzy today, woke  up this way. Denies any pain or new falls. Reports the HEP is getting easy to do;   Patient is accompained by: Family member  daughter   Limitations Walking;House hold activities   Patient Stated Goals "I want to work on walking"    Currently in Pain? No/denies   Pain Score 0-No pain          [facility] Adult PT Treatment/Exercise - 11/10/15 0903    Transfers   Transfers Sit to Stand;Stand to Sit;Stand Pivot Transfers   Sit to Stand 4: Min guard;With upper extremity assist;From bed;From chair/3-in-1;4: Min assist   Sit to Stand Details Verbal cues for sequencing;Verbal cues for technique;Verbal cues for safe use of DME/AE   Sit to Stand Details (indicate cue type and reason) increased assist when fatigued at end of session and with standing from chair without arm rests   Stand to Sit 4: Min guard;4: Min assist;With upper extremity assist;With armrests;To bed;To chair/3-in-1   Stand to Sit Details (indicate cue type and reason) Verbal cues for sequencing;Verbal cues for technique;Verbal cues for precautions/safety;Verbal cues for safe use of DME/AE   Stand to Sit Details cues on hand placement and use of arms to control descent.   Stand Pivot Transfers 4: Min guard   Number of Reps 10 reps;1 set   Transfer Cueing cues for scooting to edge and weight shifting. cues to come  up into full upright standing.   Ambulation/Gait   Ambulation/Gait Yes   Ambulation/Gait Assistance 4: Min guard;4: Min assist   Ambulation/Gait Assistance Details cues on posture, increased step length, increased stride lenght. Increased assist for balance needed as gait distance progressed. pt with multiple episodes of right foot "sticking" with gait, needing cues to lift it higher to prevent this from occuring.   Ambulation Distance (Feet) 115 Feet   Assistive device Rolling walker   Gait Pattern Step-through pattern;Decreased stride length;Decreased stance time - right;Decreased step length - right;Decreased step length -  left;Decreased weight shift to right;Decreased hip/knee flexion - right;Narrow base of support   Ambulation Surface Level;Indoor   High Level Balance   High Level Balance Activities Marching forwards;Side stepping;Tandem walking;Backward walking   High Level Balance Comments at counter top with UE support and contralateral HHA for balance: 3-4 laps each with min to mod assist for balance in addition to UE support on counter top. cues on posture and ex form/technique.   Knee/Hip Exercises: Standing   Heel Raises Both;1 set;10 reps;5 seconds;Limitations   Heel Raises Limitations cues on posture and hold times with bil UE support on counter top.             PT Short Term Goals - 11/04/15 2033    PT SHORT TERM GOAL #1   Title Pt will initiate HEP for strengthening and balance in order to improve functional mobility.  (Target Date: 11/28/15)   Status New   PT SHORT TERM GOAL #2   Title Pt will improve TUG score to <80 seconds in order to indicate decreased fall risk.     Status New   PT SHORT TERM GOAL #3   Title Will assess BERG and improve score by 4 points in order to indicate decreased fall risk and functional improvement in balance.     Baseline 11/04/15: 12/56 baseline Berg Balance test score   Status New   PT SHORT TERM GOAL #4   Title Pt will ambulate over indoor surfaces x 150' w/ RW at S level in order to indicate safe negotiation in home.     PT SHORT TERM GOAL #5   Title Pt will verbalize fall prevention strategies in order to indicate decreased fall risk inside and outside of home.     PT SHORT TERM GOAL #6   Title Pt will perform 8/10 sit <>stands with single UE support only at mod I level in order to indicate improved functional strength.             PT Long Term Goals - 11/04/15 2033    PT LONG TERM GOAL #1   Title Pt will be independent with HEP in order to indicate improved functional mobility and decreased fall risk.  (Target Date: 12/26/15)   Status New   PT LONG  TERM GOAL #2   Title Pt will improve TUG score to <40 secs in order to indicate decreased fall risk.     Status New   PT LONG TERM GOAL #3   Title Pt will improve BERG balance score 8 points from baseline in order to indicate decreased fall risk.     Baseline 11/04/15: baseline Berg balance score is 12/56   Status New   PT LONG TERM GOAL #4   Title Pt will perform 8/10 sit<>stand without UE support at mod I level in order to indicate improved functional strength.     Status New   PT LONG TERM GOAL #5  Title Pt will ambulate 300' w/ LRAD over paved outdoor surfaces at mod I level in order to indicate safe return to community.     PT LONG TERM GOAL #6   Title Pt will negotiate up/down 5 steps w/ single rail at mod I level in order to indicate safe entry into home/family's homes.     Status New            Plan - 11/10/15 [id number]    Clinical Impression Statement Today's session continued to focus on gait, standing balance and LE strengthening. Pt continues to fatigue quickly, needing frequent seated rest breaks throughout session. Pt is progressing toward goals with increased consecutive gait distance today and increased overall activities performed.                                 Rehab Potential Excellent   PT Frequency 2x / week   PT Duration 8 weeks   PT Treatment/Interventions ADLs/Self Care Home Management;Electrical Stimulation;DME Instruction;Gait training;Stair training;Functional mobility training;Therapeutic activities;Therapeutic exercise;Balance training;Neuromuscular re-education;Patient/family education;Orthotic Fit/Training;Vestibular;Visual/perceptual remediation/compensation   PT Next Visit Plan add single leg stance, tandem stance and feet together with EO/EC in corner to HEP when appropriate; work on gait with walker   Consulted and Agree with Plan of Care Patient;Family member/caregiver   Family Member Consulted daughter [name]      Patient will benefit from skilled  therapeutic intervention in order to improve the following deficits and impairments:  Abnormal gait, Decreased activity tolerance, Decreased balance, Decreased coordination, Decreased endurance, Decreased knowledge of use of DME, Decreased mobility, Decreased strength, Dizziness, Increased edema, Impaired perceived functional ability, Impaired flexibility, Impaired UE functional use, Impaired vision/preception, Improper body mechanics, Postural dysfunction  Visit Diagnosis: Muscle weakness (generalized)  Unsteadiness on feet  Other abnormalities of gait and mobility  Other lack of coordination     Problem List Patient Active Problem List   Diagnosis Date Noted  . Cerebral thrombosis with cerebral infarction 09/07/2015  . Lacunar infarct, acute ([place])   . Nausea & vomiting 09/05/2015  . Acute kidney injury ([place]) 09/04/2015  . Type II diabetes mellitus with neurological manifestations, uncontrolled ([place]) 09/03/2015  . Bilateral lower extremity edema 09/03/2015  . Essential hypertension   . Diabetes mellitus type 2 in nonobese ([facility])   . History of breast cancer   . AKI (acute kidney injury) ([place])   . Acute blood loss anemia   . Hemiparesis ([place])   . Gait disturbance, post-stroke   . Dysphagia, post-stroke   . Acute ischemic stroke/right cerebellar 08/24/2015  . Breast cancer of upper-outer quadrant of right female breast ([place]) 04/30/2015  . CVA (cerebral infarction) 07/05/2014  . Hyperlipidemia 07/05/2014  . Syncope 07/04/2014  . Mild mitral regurgitation by prior echocardiogram 12/20/2011  . Diabetes mellitus type 1, uncontrolled, insulin dependent ([place]) 02/26/2009  . OBESITY 02/26/2009  . Uncontrolled hypertension 02/26/2009  . CAD (coronary artery disease) 02/26/2009  . GASTROESOPHAGEAL REFLUX DISEASE 02/26/2009    Willow Ora, PTA, [facility] [address] [place], [place] [address] [phone] 11/10/2015, 11:56 AM   Name: Glenda [name] MRN: [id number] Date of Birth: 07/07/1933

## 2015-11-10 NOTE — Patient Instructions (Addendum)
  Coordination Activities  Perform the following activities for 15-20 minutes 1-2 times per day with both hand(s).   Rotate ball in fingertips (clockwise and counter-clockwise).  Toss ball in air and catch with the same hand.  Flip cards 1 at a time as fast as you can.  Deal cards with your thumb (Hold deck in hand and push card off top with thumb).  Rotate card in hand (clockwise and counter-clockwise).  Pick up coins one at a time until you get 5-10 in your hand, then move coins from palm to fingertips to stack one at a time.  Practice writing and/or typing.    SHOULDER: Flexion - Sitting    Hold cane with both hands. Raise arms up. Keep elbows straight. Hold __3_ seconds. _10__ reps per set, 2___ sets per day   Abduction (Eccentric) - Active ([place])    Seated: Lift cane out to side with affected arm, Rt thumb up. Avoid hiking shoulder. Keep palm relaxed. Slowly lower affected arm for 3-5 seconds. 10___ reps per set, _2__ sets per day  http://ecce.exer.us/165   Copyright  VHI. All rights reserved.

## 2015-11-13 ENCOUNTER — Ambulatory Visit: Payer: Medicare Other | Admitting: Occupational Therapy

## 2015-11-13 ENCOUNTER — Ambulatory Visit: Payer: Medicare Other | Admitting: Rehabilitation

## 2015-11-13 ENCOUNTER — Encounter: Payer: Self-pay | Admitting: Rehabilitation

## 2015-11-13 DIAGNOSIS — R2681 Unsteadiness on feet: Secondary | ICD-10-CM

## 2015-11-13 DIAGNOSIS — R278 Other lack of coordination: Secondary | ICD-10-CM

## 2015-11-13 DIAGNOSIS — M6281 Muscle weakness (generalized): Secondary | ICD-10-CM

## 2015-11-13 DIAGNOSIS — R2689 Other abnormalities of gait and mobility: Secondary | ICD-10-CM

## 2015-11-13 NOTE — Therapy (Signed)
[facility] [address] [place], [place], [address] Phone: [phone]   Fax:  [phone]  Occupational Therapy Treatment  Patient Details  Name: Glenda [name] MRN: [id number] Date of Birth: 10-26-1933 Referring Provider: Dr. Donnie Coffin  Encounter Date: 11/13/2015      OT End of Session - 11/13/15 1502    Visit Number 3   Number of Visits 17   Date for OT Re-Evaluation 01/04/16   Authorization Type UHC MCR   Authorization - Visit Number 3   Authorization - Number of Visits 10   OT Start Time 1400   OT Stop Time [id number]   OT Time Calculation (min) 45 min   Activity Tolerance Patient tolerated treatment well      Past Medical History  Diagnosis Date  . GERD (gastroesophageal reflux disease)   . Obesity   . DM (diabetes mellitus) ([place])   . HTN (hypertension)   . HLD (hyperlipidemia)   . CAD (coronary artery disease)   . UTI (urinary tract infection)   . Chest pain   . Dizziness   . Breast cancer ([place])   . Arthritis   . Stroke ([place])   . Shortness of breath dyspnea     Past Surgical History  Procedure Laterality Date  . Cholecystectomy    . Colon polyps; hx    . Coronary angioplasty  02/14/09  . Radioactive seed guided mastectomy with axillary sentinel lymph node biopsy Right 07/17/2015    Procedure: RADIOACTIVE SEED GUIDED PARTIAL MASTECTOMY WITH AXILLARY SENTINEL LYMPH NODE BIOPSY;  Surgeon: Erroll Luna, MD;  Location: [facility];  Service: General;  Laterality: Right;  . Esophagogastroduodenoscopy (egd) with propofol Left 09/08/2015    Procedure: ESOPHAGOGASTRODUODENOSCOPY (EGD) WITH PROPOFOL;  Surgeon: Clarene Essex, MD;  Location: [facility] ENDOSCOPY;  Service: Endoscopy;  Laterality: Left;    There were no vitals filed for this visit.      Subjective Assessment - 11/13/15 1414    Pertinent History CVA 08/24/15   Patient Stated Goals To do everything I did before   Currently in Pain? No/denies                       OT Treatments/Exercises (OP) - 11/13/15 0001    Fine Motor Coordination   Other Fine Motor Exercises Reviewed coordination HEP - pt demo each x 3-5 reps   Other Fine Motor Exercises Tracing over shapes and letters for control/coordination in prep for writing. Practiced writing name with and without built up pen with no significant difference. Pt also practiced distal finger control worksheet for control/coord. in prep for writing.                   OT Short Term Goals - 11/06/15 1243    OT SHORT TERM GOAL #1   Title I with inital HEP.   Time 4   Period Weeks   Status New   OT SHORT TERM GOAL #2   Title Pt will demonstrate ability to retrieve a lightweight object at 110* with RUE.   Time 4   Period Weeks   Status New   OT SHORT TERM GOAL #3   Title Pt will demonstrate improved fine motor coordination as evidenced by decreasing 9 hole peg test score by 4 secs bilaterally.   Baseline RUE 59 secs, LUE 42.40 secs   Time 4   Period Weeks   Status New   OT SHORT TERM GOAL #4   Title  Pt will perform simple snack prep or light home management task in standing with min A without LOB demonstrating good safety awareness.   Time 4   Period Weeks   Status New   OT SHORT TERM GOAL #5   Title Pt will demonstrate ability to write a short paragraph with 95% legibility.   Time 4   Period Weeks   Status New   Additional Short Term Goals   Additional Short Term Goals Yes   OT SHORT TERM GOAL #6   Title Further assess cognition and set goal PRN   Time 4   Period Weeks   Status New           OT Long Term Goals - 11/06/15 1247    OT LONG TERM GOAL #1   Title I with updated HEP.   Time 8   Period Weeks   Status New   OT LONG TERM GOAL #2   Title Pt will perform simple cooking task in standing with supervision demonstrating good safety awareness.   Time 8   Period Weeks   Status New   OT LONG TERM GOAL #3   Title Pt will demonstrate  ability to retrieve a light weight object at 120 shoulder flexion with RUE   Time 8   Period Weeks   Status New   OT LONG TERM GOAL #4   Title Pt will resume use of RUE as dominant hand at least 75% of the time for ADLs/IADLs.   Time 8   Period Weeks   Status New   OT LONG TERM GOAL #5   Title Pt will decrease RUE 9 hole peg test score to 49 secs from for increased fine motor coordination.   Baseline RUE 59 secs   Time 8   Long Term Additional Goals   Additional Long Term Goals Yes   OT LONG TERM GOAL #6   Title Pt will be modified indpendent with all basic ADLs.   Time 8   Period Weeks   Status New               Plan - 11/13/15 1503    Clinical Impression Statement Pt progressing with coordination and writing activities.    OT Frequency 2x / week   OT Duration 8 weeks   OT Treatment/Interventions Self-care/ADL training;Therapeutic exercise;Patient/family education;Functional Mobility Training;Neuromuscular education;Manual Therapy;Therapeutic exercises;DME and/or AE instruction;Therapeutic activities;Electrical Stimulation;Fluidtherapy;Cognitive remediation/compensation;Moist Heat;Passive range of motion;Visual/perceptual remediation/compensation   Plan RUE reaching and coordination activities, simple snack prep from w/c level and safety with standing to reach for item out of high cabinet   Consulted and Agree with Plan of Care Patient;Family member/caregiver   Family Member Consulted daughter      Patient will benefit from skilled therapeutic intervention in order to improve the following deficits and impairments:  Abnormal gait, Decreased coordination, Decreased range of motion, Difficulty walking, Decreased safety awareness, Decreased endurance, Decreased activity tolerance, Decreased knowledge of precautions, Impaired UE functional use, Decreased knowledge of use of DME, Decreased balance, Decreased cognition, Decreased mobility, Decreased strength, Impaired perceived  functional ability  Visit Diagnosis: Other lack of coordination    Problem List Patient Active Problem List   Diagnosis Date Noted  . Cerebral thrombosis with cerebral infarction 09/07/2015  . Lacunar infarct, acute ([place])   . Nausea & vomiting 09/05/2015  . Acute kidney injury ([place]) 09/04/2015  . Type II diabetes mellitus with neurological manifestations, uncontrolled ([place]) 09/03/2015  . Bilateral lower extremity edema 09/03/2015  . Essential  hypertension   . Diabetes mellitus type 2 in nonobese ([facility])   . History of breast cancer   . AKI (acute kidney injury) ([place])   . Acute blood loss anemia   . Hemiparesis ([place])   . Gait disturbance, post-stroke   . Dysphagia, post-stroke   . Acute ischemic stroke/right cerebellar 08/24/2015  . Breast cancer of upper-outer quadrant of right female breast ([place]) 04/30/2015  . CVA (cerebral infarction) 07/05/2014  . Hyperlipidemia 07/05/2014  . Syncope 07/04/2014  . Mild mitral regurgitation by prior echocardiogram 12/20/2011  . Diabetes mellitus type 1, uncontrolled, insulin dependent ([place]) 02/26/2009  . OBESITY 02/26/2009  . Uncontrolled hypertension 02/26/2009  . CAD (coronary artery disease) 02/26/2009  . GASTROESOPHAGEAL REFLUX DISEASE 02/26/2009    Carey Bullocks, OTR/L 11/13/2015, 3:04 PM  [facility] [address] [place], [place], [address] Phone: [phone]   Fax:  [phone]  Name: Lorinda Saragoza MRN: [id number] Date of Birth: 01/17/1934

## 2015-11-13 NOTE — Patient Instructions (Addendum)
Extensors / Rotators, Sitting Knee to Chest    Sit, both hands cupped under one knee (grab over top of the knee). Gently pull toward chest. Exhale as knee comes up. Hold _60__ seconds.  Repeat __2_ times per session. Do _2-3__ sessions per day.  Copyright  VHI. All rights reserved.   Exercise:  Functional Quadriceps: Sit to Stand    Sit on edge of chair facing kitchen counter top, feet flat on floor. Stand upright, extending knees fully. Use armrests on chair with standing up and to slow you down with sitting. Use counter top to catch balance as needed. Repeat _10_ times per set. Do _1 sets per session. Do _1-2_ sessions per day.  http://orth.exer.us/735     Copyright  VHI. All rights reserved.  Toe / Heel Raise (Standing)    Standing with support at kitchen sink, raise heels, then rock back on heels and raise toes.  Can do while standing at walker.   Repeat __10_ times.  Copyright  VHI. All rights reserved.    Hip Side Kick    Holding onto kitchen counter/sink for balance, keep legs shoulder width apart and toes pointed forward. Swing a leg out to side, keeping knee straight. Do not lean. Repeat using other leg, alternating legs.  Repeat __10__ times. Do __1-2__ sessions per day.  Can modify by doing this exercise while holding onto walker.  Push walker just far enough ahead of you to clear your leg when you move it to the side.  Can do in front of couch/chair for increased safety.   http://gt2.exer.us/343   Copyright  VHI. All rights reserved.   Side-Stepping    At kitchen counter top: Walk to left side with eyes open. Take even steps, leading with same foot. Make sure each foot lifts off the floor. Repeat in opposite direction (toward right side). Repeat for 3 laps each way. Do __1-2__ sessions per day.  Have your daughter place chair beside of counter top so that you can do exercise and then sit and rest before walking back to living room, etc.

## 2015-11-14 ENCOUNTER — Other Ambulatory Visit: Payer: Self-pay | Admitting: *Deleted

## 2015-11-14 DIAGNOSIS — C50411 Malignant neoplasm of upper-outer quadrant of right female breast: Secondary | ICD-10-CM

## 2015-11-14 NOTE — Therapy (Signed)
[facility] [address] [place], [place], [address] Phone: [phone]   Fax:  [phone]  Physical Therapy Treatment  Patient Details  Name: Glenda [name] MRN: [id number] Date of Birth: 1933/12/26 Referring Provider: Donnie Coffin, MD  Encounter Date: 11/13/2015      PT End of Session - 11/14/15 0740    Visit Number 5   Number of Visits 17   Date for PT Re-Evaluation 12/30/15   Authorization Type [employer] MCR- G code needed every 10th visit   PT Start Time 1315   PT Stop Time 1400   PT Time Calculation (min) 45 min   Equipment Utilized During Treatment Gait belt   Activity Tolerance Patient limited by fatigue   Behavior During Therapy [facility] for tasks assessed/performed      Past Medical History  Diagnosis Date  . GERD (gastroesophageal reflux disease)   . Obesity   . DM (diabetes mellitus) ([place])   . HTN (hypertension)   . HLD (hyperlipidemia)   . CAD (coronary artery disease)   . UTI (urinary tract infection)   . Chest pain   . Dizziness   . Breast cancer ([place])   . Arthritis   . Stroke ([employer])   . Shortness of breath dyspnea     Past Surgical History  Procedure Laterality Date  . Cholecystectomy    . Colon polyps; hx    . Coronary angioplasty  02/14/09  . Radioactive seed guided mastectomy with axillary sentinel lymph node biopsy Right 07/17/2015    Procedure: RADIOACTIVE SEED GUIDED PARTIAL MASTECTOMY WITH AXILLARY SENTINEL LYMPH NODE BIOPSY;  Surgeon: Erroll Luna, MD;  Location: [facility];  Service: General;  Laterality: Right;  . Esophagogastroduodenoscopy (egd) with propofol Left 09/08/2015    Procedure: ESOPHAGOGASTRODUODENOSCOPY (EGD) WITH PROPOFOL;  Surgeon: Clarene Essex, MD;  Location: [facility] ENDOSCOPY;  Service: Endoscopy;  Laterality: Left;    There were no vitals filed for this visit.      Subjective Assessment - 11/13/15 1326    Subjective Reports dizziness is better, is trying to walk more at  home but has not been doing exercises every day.     Patient is accompained by: Family member  Virgie   Limitations Walking;House hold activities   Patient Stated Goals "I want to work on walking"    Currently in Pain? No/denies         NMR:  Went over current HEP for BLE (RLE) strengthening and NMR, see pt instruction.  Pt has not been compliant with exercises due to feeling uncomfortable performing them on her own.  Went through each exercise and modified so that she could perform independently for increased compliance.  Pt and daughter verbalized understanding.  Also added R quad stretch (seated knee to chest so that pt could perform on her own) as she states increased tightness in thigh, esp after walking and exercise.  See pt instruction.    Self Care:  Had frank discussion with pt and daughter about increasing amount that pt is ambulating at home and working through multiple exercises before sitting in order to increase activity tolerance and functional endurance.  Discussed that goal is for pt to be able to ambulate into and out of clinic and still be able to participate in therapy session, therefore pt would need to increase activity at home to be able to do this.  Pt and daughter verbalized understanding.  PT Education - 11/14/15 0740    Education provided Yes   Education Details how to modify HEP so that she can perform on her own for increased compliance.     Person(s) Educated Patient;Child(ren)   Methods Explanation   Comprehension Verbalized understanding          PT Short Term Goals - 11/04/15 2033    PT SHORT TERM GOAL #1   Title Pt will initiate HEP for strengthening and balance in order to improve functional mobility.  (Target Date: 11/28/15)   Status New   PT SHORT TERM GOAL #2   Title Pt will improve TUG score to <80 seconds in order to indicate decreased fall risk.     Status New   PT SHORT TERM GOAL #3   Title Will  assess BERG and improve score by 4 points in order to indicate decreased fall risk and functional improvement in balance.     Baseline 11/04/15: 12/56 baseline Berg Balance test score   Status New   PT SHORT TERM GOAL #4   Title Pt will ambulate over indoor surfaces x 150' w/ RW at S level in order to indicate safe negotiation in home.     PT SHORT TERM GOAL #5   Title Pt will verbalize fall prevention strategies in order to indicate decreased fall risk inside and outside of home.     PT SHORT TERM GOAL #6   Title Pt will perform 8/10 sit <>stands with single UE support only at mod I level in order to indicate improved functional strength.             PT Long Term Goals - 11/04/15 2033    PT LONG TERM GOAL #1   Title Pt will be independent with HEP in order to indicate improved functional mobility and decreased fall risk.  (Target Date: 12/26/15)   Status New   PT LONG TERM GOAL #2   Title Pt will improve TUG score to <40 secs in order to indicate decreased fall risk.     Status New   PT LONG TERM GOAL #3   Title Pt will improve BERG balance score 8 points from baseline in order to indicate decreased fall risk.     Baseline 11/04/15: baseline Berg balance score is 12/56   Status New   PT LONG TERM GOAL #4   Title Pt will perform 8/10 sit<>stand without UE support at mod I level in order to indicate improved functional strength.     Status New   PT LONG TERM GOAL #5   Title Pt will ambulate 300' w/ LRAD over paved outdoor surfaces at mod I level in order to indicate safe return to community.     PT LONG TERM GOAL #6   Title Pt will negotiate up/down 5 steps w/ single rail at mod I level in order to indicate safe entry into home/family's homes.     Status New               Plan - 11/14/15 0741    Clinical Impression Statement Skilled session focused on addressing HEP concerns as pt has not been compliant as she feels she cannot do them on her own.  Also conitnue to educate on  ambulating more at home to work on increasing activity tolerance.     Rehab Potential Excellent   PT Frequency 2x / week   PT Duration 8 weeks   PT Treatment/Interventions ADLs/Self Care Home Management;Electrical Stimulation;DME Instruction;Gait training;Stair  training;Functional mobility training;Therapeutic activities;Therapeutic exercise;Balance training;Neuromuscular re-education;Patient/family education;Orthotic Fit/Training;Vestibular;Visual/perceptual remediation/compensation   PT Next Visit Plan add single leg stance, tandem stance and feet together with EO/EC in corner to HEP when appropriate; work on gait with walker, endurance   Consulted and Agree with Plan of Care Patient;Family member/caregiver   Family Member Consulted daughter Virgie      Patient will benefit from skilled therapeutic intervention in order to improve the following deficits and impairments:  Abnormal gait, Decreased activity tolerance, Decreased balance, Decreased coordination, Decreased endurance, Decreased knowledge of use of DME, Decreased mobility, Decreased strength, Dizziness, Increased edema, Impaired perceived functional ability, Impaired flexibility, Impaired UE functional use, Impaired vision/preception, Improper body mechanics, Postural dysfunction  Visit Diagnosis: Muscle weakness (generalized)  Unsteadiness on feet  Other abnormalities of gait and mobility     Problem List Patient Active Problem List   Diagnosis Date Noted  . Cerebral thrombosis with cerebral infarction 09/07/2015  . Lacunar infarct, acute ([place])   . Nausea & vomiting 09/05/2015  . Acute kidney injury ([place]) 09/04/2015  . Type II diabetes mellitus with neurological manifestations, uncontrolled ([place]) 09/03/2015  . Bilateral lower extremity edema 09/03/2015  . Essential hypertension   . Diabetes mellitus type 2 in nonobese ([facility])   . History of breast cancer   . AKI (acute kidney injury) ([place])   . Acute blood loss anemia    . Hemiparesis ([place])   . Gait disturbance, post-stroke   . Dysphagia, post-stroke   . Acute ischemic stroke/right cerebellar 08/24/2015  . Breast cancer of upper-outer quadrant of right female breast ([place]) 04/30/2015  . CVA (cerebral infarction) 07/05/2014  . Hyperlipidemia 07/05/2014  . Syncope 07/04/2014  . Mild mitral regurgitation by prior echocardiogram 12/20/2011  . Diabetes mellitus type 1, uncontrolled, insulin dependent ([place]) 02/26/2009  . OBESITY 02/26/2009  . Uncontrolled hypertension 02/26/2009  . CAD (coronary artery disease) 02/26/2009  . GASTROESOPHAGEAL REFLUX DISEASE 02/26/2009    Cameron Sprang, PT, MPT [facility] [address] [place], [place], [address] Phone: [phone]   Fax:  [phone] 11/14/2015, 7:47 AM  Name: Glenda [name] MRN: [id number] Date of Birth: 07/28/1933

## 2015-11-17 ENCOUNTER — Ambulatory Visit: Payer: Medicare Other | Admitting: Rehabilitation

## 2015-11-17 ENCOUNTER — Encounter: Payer: Self-pay | Admitting: Rehabilitation

## 2015-11-17 ENCOUNTER — Ambulatory Visit: Payer: Medicare Other | Admitting: Occupational Therapy

## 2015-11-17 ENCOUNTER — Telehealth: Payer: Self-pay | Admitting: Hematology and Oncology

## 2015-11-17 DIAGNOSIS — R2689 Other abnormalities of gait and mobility: Secondary | ICD-10-CM

## 2015-11-17 DIAGNOSIS — R278 Other lack of coordination: Secondary | ICD-10-CM

## 2015-11-17 DIAGNOSIS — R2681 Unsteadiness on feet: Secondary | ICD-10-CM

## 2015-11-17 DIAGNOSIS — M6281 Muscle weakness (generalized): Secondary | ICD-10-CM

## 2015-11-17 NOTE — Patient Instructions (Signed)
Standing at counter top and chair behind you for safety:    Start with feet apart and I want you to try and go from using both hands progressing to only one hand, to no hands eventually.   Hold the position for 30 seconds and repeat 3 times.   Then move feet closer together.  Start with both hands as before and progress to one hand as able to light support to no support.  Hold position for 30 seconds and repeat 3 times.    When moving from one hand support to no hand support you can still progress by having 3-4 fingers of support to 2-3 fingers support.

## 2015-11-17 NOTE — Telephone Encounter (Signed)
Spoke with patient. Appointment confirmed. Confirmation letter and schedule mailed at patient's request. Glenda [name].

## 2015-11-17 NOTE — Patient Instructions (Signed)
  Laying on your back, hold a ball between your hands , raise arms up to shoulder height without elevating shoulder or arching back, then lower ball back to your hips. Keep elbows straight.    Perform 2 sets of 10 reps 1x day  Laying on your back, hold a ball in between both hands, straighten elbows, then bend elbows and bring ball to your chest     Perform 2 sets of 10 reps 1x day

## 2015-11-17 NOTE — Therapy (Signed)
[facility] [address] [place], [place], [address] Phone: [phone]   Fax:  [phone]  Occupational Therapy Treatment  Patient Details  Name: Glenda [name] MRN: [id number] Date of Birth: 29-Nov-1933 Referring Provider: Dr. Donnie Coffin  Encounter Date: 11/17/2015      OT End of Session - 11/17/15 1438    Activity Tolerance Patient tolerated treatment well   Behavior During Therapy [facility] for tasks assessed/performed      Past Medical History:  Diagnosis Date  . Arthritis   . Breast cancer ([place])   . CAD (coronary artery disease)   . Chest pain   . Dizziness   . DM (diabetes mellitus) ([place])   . GERD (gastroesophageal reflux disease)   . HLD (hyperlipidemia)   . HTN (hypertension)   . Obesity   . Shortness of breath dyspnea   . Stroke ([place])   . UTI (urinary tract infection)     Past Surgical History:  Procedure Laterality Date  . CHOLECYSTECTOMY    . colon polyps; hx    . CORONARY ANGIOPLASTY  02/14/09  . ESOPHAGOGASTRODUODENOSCOPY (EGD) WITH PROPOFOL Left 09/08/2015   Procedure: ESOPHAGOGASTRODUODENOSCOPY (EGD) WITH PROPOFOL;  Surgeon: Clarene Essex, MD;  Location: [facility] ENDOSCOPY;  Service: Endoscopy;  Laterality: Left;  . RADIOACTIVE SEED GUIDED MASTECTOMY WITH AXILLARY SENTINEL LYMPH NODE BIOPSY Right 07/17/2015   Procedure: RADIOACTIVE SEED GUIDED PARTIAL MASTECTOMY WITH AXILLARY SENTINEL LYMPH NODE BIOPSY;  Surgeon: Erroll Luna, MD;  Location: [facility];  Service: General;  Laterality: Right;    There were no vitals filed for this visit.      Subjective Assessment - 11/17/15 1434    Subjective  Pt reports she has been working with her coins at home   Pertinent History CVA 08/24/15   Patient Stated Goals To do everything I did before   Currently in Pain? No/denies            Handwriting activities including tracing then writing strategies for cursive emphasizing larger size, mod difficulty  overall. Pt writes her name with grossly 75% accuracy. Pt performed shoulder exercises in supine with ball, 2 sets of 10 reps each min-mod v.c.-see pt instructions.                  OT Education - 11/17/15 1437    Education provided Yes   Education Details supine ball ex for shoulder flexion/ chest press   Person(s) Educated Patient;Child(ren)   Methods Explanation;Demonstration;Verbal cues;Handout   Comprehension Verbalized understanding;Returned demonstration;Verbal cues required          OT Short Term Goals - 11/06/15 1243      OT SHORT TERM GOAL #1   Title I with inital HEP.   Time 4   Period Weeks   Status New     OT SHORT TERM GOAL #2   Title Pt will demonstrate ability to retrieve a lightweight object at 110* with RUE.   Time 4   Period Weeks   Status New     OT SHORT TERM GOAL #3   Title Pt will demonstrate improved fine motor coordination as evidenced by decreasing 9 hole peg test score by 4 secs bilaterally.   Baseline RUE 59 secs, LUE 42.40 secs   Time 4   Period Weeks   Status New     OT SHORT TERM GOAL #4   Title Pt will perform simple snack prep or light home management task in standing with min A without LOB demonstrating good  safety awareness.   Time 4   Period Weeks   Status New     OT SHORT TERM GOAL #5   Title Pt will demonstrate ability to write a short paragraph with 95% legibility.   Time 4   Period Weeks   Status New     Additional Short Term Goals   Additional Short Term Goals Yes     OT SHORT TERM GOAL #6   Title Further assess cognition and set goal PRN   Time 4   Period Weeks   Status New           OT Long Term Goals - 11/06/15 1247      OT LONG TERM GOAL #1   Title I with updated HEP.   Time 8   Period Weeks   Status New     OT LONG TERM GOAL #2   Title Pt will perform simple cooking task in standing with supervision demonstrating good safety awareness.   Time 8   Period Weeks   Status New     OT LONG  TERM GOAL #3   Title Pt will demonstrate ability to retrieve a light weight object at 120 shoulder flexion with RUE   Time 8   Period Weeks   Status New     OT LONG TERM GOAL #4   Title Pt will resume use of RUE as dominant hand at least 75% of the time for ADLs/IADLs.   Time 8   Period Weeks   Status New     OT LONG TERM GOAL #5   Title Pt will decrease RUE 9 hole peg test score to 49 secs from for increased fine motor coordination.   Baseline RUE 59 secs   Time 8     Long Term Additional Goals   Additional Long Term Goals Yes     OT LONG TERM GOAL #6   Title Pt will be modified indpendent with all basic ADLs.   Time 8   Period Weeks   Status New               Plan - 11/17/15 1435    Clinical Impression Statement Pt is progressing towards goals for RUE functional use.   Rehab Potential Good   OT Frequency 2x / week   OT Duration 8 weeks   OT Treatment/Interventions Self-care/ADL training;Therapeutic exercise;Patient/family education;Functional Mobility Training;Neuromuscular education;Manual Therapy;Therapeutic exercises;DME and/or AE instruction;Therapeutic activities;Electrical Stimulation;Fluidtherapy;Cognitive remediation/compensation;Moist Heat;Passive range of motion;Visual/perceptual remediation/compensation   Plan RUE reaching, simple snack prep at w/c level, safety to stand to retrieve item out of a cabinet   Consulted and Agree with Plan of Care Patient;Family member/caregiver   Family Member Consulted daughter      Patient will benefit from skilled therapeutic intervention in order to improve the following deficits and impairments:  Abnormal gait, Decreased coordination, Decreased range of motion, Difficulty walking, Decreased safety awareness, Decreased endurance, Decreased activity tolerance, Decreased knowledge of precautions, Impaired UE functional use, Decreased knowledge of use of DME, Decreased balance, Decreased cognition, Decreased mobility,  Decreased strength, Impaired perceived functional ability  Visit Diagnosis: Other lack of coordination  Muscle weakness (generalized)  Unsteadiness on feet  Other abnormalities of gait and mobility    Problem List Patient Active Problem List   Diagnosis Date Noted  . Cerebral thrombosis with cerebral infarction 09/07/2015  . Lacunar infarct, acute ([place])   . Nausea & vomiting 09/05/2015  . Acute kidney injury ([place]) 09/04/2015  . Type II diabetes mellitus  with neurological manifestations, uncontrolled ([place]) 09/03/2015  . Bilateral lower extremity edema 09/03/2015  . Essential hypertension   . Diabetes mellitus type 2 in nonobese ([facility])   . History of breast cancer   . AKI (acute kidney injury) ([place])   . Acute blood loss anemia   . Hemiparesis ([place])   . Gait disturbance, post-stroke   . Dysphagia, post-stroke   . Acute ischemic stroke/right cerebellar 08/24/2015  . Breast cancer of upper-outer quadrant of right female breast ([place]) 04/30/2015  . CVA (cerebral infarction) 07/05/2014  . Hyperlipidemia 07/05/2014  . Syncope 07/04/2014  . Mild mitral regurgitation by prior echocardiogram 12/20/2011  . Diabetes mellitus type 1, uncontrolled, insulin dependent ([place]) 02/26/2009  . OBESITY 02/26/2009  . Uncontrolled hypertension 02/26/2009  . CAD (coronary artery disease) 02/26/2009  . GASTROESOPHAGEAL REFLUX DISEASE 02/26/2009    Vantasia Pinkney 11/17/2015, 2:38 PM Theone Murdoch, OTR/L [phone] Phone: [phone] 2:38 PM 11/17/15 [facility] [address] [place], [place], [address] Phone: [phone]   Fax:  [phone]  Name: Glenda [name] MRN: [id number] Date of Birth: 02/16/1934

## 2015-11-17 NOTE — Therapy (Signed)
[facility] [address] [place], [place], [address] Phone: [phone]   Fax:  [phone]  Physical Therapy Treatment  Patient Details  Name: Glenda [name] MRN: [id number] Date of Birth: 17-May-1933 Referring Provider: Donnie Coffin, MD  Encounter Date: 11/17/2015      PT End of Session - 11/17/15 1321    Visit Number 6   Number of Visits 17   Date for PT Re-Evaluation 12/30/15   Authorization Type UHC MCR- G code needed every 10th visit   PT Start Time 1315   PT Stop Time 1402   PT Time Calculation (min) 47 min   Equipment Utilized During Treatment --   Activity Tolerance Patient limited by fatigue   Behavior During Therapy [facility] for tasks assessed/performed      Past Medical History:  Diagnosis Date  . Arthritis   . Breast cancer ([place])   . CAD (coronary artery disease)   . Chest pain   . Dizziness   . DM (diabetes mellitus) ([place])   . GERD (gastroesophageal reflux disease)   . HLD (hyperlipidemia)   . HTN (hypertension)   . Obesity   . Shortness of breath dyspnea   . Stroke ([place])   . UTI (urinary tract infection)     Past Surgical History:  Procedure Laterality Date  . CHOLECYSTECTOMY    . colon polyps; hx    . CORONARY ANGIOPLASTY  02/14/09  . ESOPHAGOGASTRODUODENOSCOPY (EGD) WITH PROPOFOL Left 09/08/2015   Procedure: ESOPHAGOGASTRODUODENOSCOPY (EGD) WITH PROPOFOL;  Surgeon: Clarene Essex, MD;  Location: [facility] ENDOSCOPY;  Service: Endoscopy;  Laterality: Left;  . RADIOACTIVE SEED GUIDED MASTECTOMY WITH AXILLARY SENTINEL LYMPH NODE BIOPSY Right 07/17/2015   Procedure: RADIOACTIVE SEED GUIDED PARTIAL MASTECTOMY WITH AXILLARY SENTINEL LYMPH NODE BIOPSY;  Surgeon: Erroll Luna, MD;  Location: [facility];  Service: General;  Laterality: Right;    There were no vitals filed for this visit.      Subjective Assessment - 11/17/15 1320    Subjective Reports no changes, no falls.     Patient is accompained by:  Family member  winnie   Limitations Walking;House hold activities   Patient Stated Goals "I want to work on walking"    Currently in Pain? No/denies         Self Care:  Had lengthy discussion regarding tightness in RLE, possibility of tone increasing esp as pt states that it increases with fatigue and more activity.  Recommended that she speak with MD at next visit regarding this matter to see if any sort of medical management is needed to decrease tightness in LE with functional mobility.  Also discussed that I would like for her to D/C use of w/c in the house at this time.  Feel that this will improve pts activity tolerance.  Pt and daughter verbalized understanding.   NMR:  Performed corner balance tasks during session; standing with feet apart without UE support x 2 reps of 30 secs progressing to feet closer together (not quite touching) with single UE support x 2 reps of 30 secs.  Education on how to progress.  Note that pt has increased difficulty getting into and out of corner, therefore recommend she stand at counter top, have daughter with her and have chair behind her for support.  Both verbalized understanding.    TE:  Addressed BLE strength, decreasing tone, and improving endurance with seated nustep x 4 mins at 0 resistance.  Pt with increased fatigue, but able to perform without  stopping.                          PT Education - 11/17/15 1656    Education provided Yes   Education Details adding counter top balance activities   Person(s) Educated Patient;Child(ren)   Methods Explanation;Handout;Demonstration   Comprehension Verbalized understanding;Returned demonstration          PT Short Term Goals - 11/04/15 2033      PT SHORT TERM GOAL #1   Title Pt will initiate HEP for strengthening and balance in order to improve functional mobility.  (Target Date: 11/28/15)   Status New     PT SHORT TERM GOAL #2   Title Pt will improve TUG score to <80 seconds  in order to indicate decreased fall risk.     Status New     PT SHORT TERM GOAL #3   Title Will assess BERG and improve score by 4 points in order to indicate decreased fall risk and functional improvement in balance.     Baseline 11/04/15: 12/56 baseline Berg Balance test score   Status New     PT SHORT TERM GOAL #4   Title Pt will ambulate over indoor surfaces x 150' w/ RW at S level in order to indicate safe negotiation in home.       PT SHORT TERM GOAL #5   Title Pt will verbalize fall prevention strategies in order to indicate decreased fall risk inside and outside of home.       PT SHORT TERM GOAL #6   Title Pt will perform 8/10 sit <>stands with single UE support only at mod I level in order to indicate improved functional strength.             PT Long Term Goals - 11/04/15 2033      PT LONG TERM GOAL #1   Title Pt will be independent with HEP in order to indicate improved functional mobility and decreased fall risk.  (Target Date: 12/26/15)   Status New     PT LONG TERM GOAL #2   Title Pt will improve TUG score to <40 secs in order to indicate decreased fall risk.     Status New     PT LONG TERM GOAL #3   Title Pt will improve BERG balance score 8 points from baseline in order to indicate decreased fall risk.     Baseline 11/04/15: baseline Berg balance score is 12/56   Status New     PT LONG TERM GOAL #4   Title Pt will perform 8/10 sit<>stand without UE support at mod I level in order to indicate improved functional strength.     Status New     PT LONG TERM GOAL #5   Title Pt will ambulate 300' w/ LRAD over paved outdoor surfaces at mod I level in order to indicate safe return to community.       PT LONG TERM GOAL #6   Title Pt will negotiate up/down 5 steps w/ single rail at mod I level in order to indicate safe entry into home/family's homes.     Status New               Plan - 11/17/15 1321    Clinical Impression Statement Skilled session focused on  gait training with RW for quality and endurance, education on speaking with MD regarding tightness in RLE, counter top balance tasks, and seated nustep for balance, decreased  tone, and endurance.     Rehab Potential Excellent   PT Frequency 2x / week   PT Duration 8 weeks   PT Treatment/Interventions ADLs/Self Care Home Management;Electrical Stimulation;DME Instruction;Gait training;Stair training;Functional mobility training;Therapeutic activities;Therapeutic exercise;Balance training;Neuromuscular re-education;Patient/family education;Orthotic Fit/Training;Vestibular;Visual/perceptual remediation/compensation   PT Next Visit Plan add single leg stance, tandem stance and feet together with EO/EC in corner to HEP when appropriate; work on gait with walker, endurance   Consulted and Agree with Plan of Care Patient;Family member/caregiver   Family Member Consulted daughter [name]      Patient will benefit from skilled therapeutic intervention in order to improve the following deficits and impairments:  Abnormal gait, Decreased activity tolerance, Decreased balance, Decreased coordination, Decreased endurance, Decreased knowledge of use of DME, Decreased mobility, Decreased strength, Dizziness, Increased edema, Impaired perceived functional ability, Impaired flexibility, Impaired UE functional use, Impaired vision/preception, Improper body mechanics, Postural dysfunction  Visit Diagnosis: Muscle weakness (generalized)  Unsteadiness on feet  Other abnormalities of gait and mobility     Problem List Patient Active Problem List   Diagnosis Date Noted  . Cerebral thrombosis with cerebral infarction 09/07/2015  . Lacunar infarct, acute ([place])   . Nausea & vomiting 09/05/2015  . Acute kidney injury ([place]) 09/04/2015  . Type II diabetes mellitus with neurological manifestations, uncontrolled ([place]) 09/03/2015  . Bilateral lower extremity edema 09/03/2015  . Essential hypertension   . Diabetes  mellitus type 2 in nonobese ([facility])   . History of breast cancer   . AKI (acute kidney injury) ([place])   . Acute blood loss anemia   . Hemiparesis ([place])   . Gait disturbance, post-stroke   . Dysphagia, post-stroke   . Acute ischemic stroke/right cerebellar 08/24/2015  . Breast cancer of upper-outer quadrant of right female breast ([place]) 04/30/2015  . CVA (cerebral infarction) 07/05/2014  . Hyperlipidemia 07/05/2014  . Syncope 07/04/2014  . Mild mitral regurgitation by prior echocardiogram 12/20/2011  . Diabetes mellitus type 1, uncontrolled, insulin dependent ([place]) 02/26/2009  . OBESITY 02/26/2009  . Uncontrolled hypertension 02/26/2009  . CAD (coronary artery disease) 02/26/2009  . GASTROESOPHAGEAL REFLUX DISEASE 02/26/2009    Cameron Sprang, PT, MPT [facility] [address] [place], [place], [address] Phone: [phone]   Fax:  [phone] 11/17/15, 5:01 PM  Name: Glenda [name] MRN: [id number] Date of Birth: November 18, 1933

## 2015-11-18 ENCOUNTER — Ambulatory Visit: Payer: Medicare Other | Admitting: Nurse Practitioner

## 2015-11-19 ENCOUNTER — Ambulatory Visit: Payer: Medicare Other | Admitting: Physical Therapy

## 2015-11-19 ENCOUNTER — Ambulatory Visit: Payer: Medicare Other | Admitting: Occupational Therapy

## 2015-11-19 DIAGNOSIS — M6281 Muscle weakness (generalized): Secondary | ICD-10-CM

## 2015-11-19 DIAGNOSIS — R2681 Unsteadiness on feet: Secondary | ICD-10-CM

## 2015-11-19 DIAGNOSIS — R2689 Other abnormalities of gait and mobility: Secondary | ICD-10-CM

## 2015-11-19 DIAGNOSIS — R278 Other lack of coordination: Secondary | ICD-10-CM

## 2015-11-19 NOTE — Therapy (Signed)
[facility] [address] [place], [place], [address] Phone: [phone]   Fax:  [phone]  Physical Therapy Treatment  Patient Details  Name: Glenda [name] MRN: [id number] Date of Birth: 1933-12-17 Referring Provider: Donnie Coffin, MD  Encounter Date: 11/19/2015      PT End of Session - 11/19/15 0918    Visit Number 7   Number of Visits 17   PT Start Time 0845   PT Stop Time 0930   PT Time Calculation (min) 45 min   Equipment Utilized During Treatment Gait belt   Activity Tolerance Patient tolerated treatment well;Patient limited by fatigue   Behavior During Therapy [facility] for tasks assessed/performed      Past Medical History:  Diagnosis Date  . Arthritis   . Breast cancer ([place])   . CAD (coronary artery disease)   . Chest pain   . Dizziness   . DM (diabetes mellitus) ([place])   . GERD (gastroesophageal reflux disease)   . HLD (hyperlipidemia)   . HTN (hypertension)   . Obesity   . Shortness of breath dyspnea   . Stroke ([place])   . UTI (urinary tract infection)     Past Surgical History:  Procedure Laterality Date  . CHOLECYSTECTOMY    . colon polyps; hx    . CORONARY ANGIOPLASTY  02/14/09  . ESOPHAGOGASTRODUODENOSCOPY (EGD) WITH PROPOFOL Left 09/08/2015   Procedure: ESOPHAGOGASTRODUODENOSCOPY (EGD) WITH PROPOFOL;  Surgeon: Clarene Essex, MD;  Location: [facility] ENDOSCOPY;  Service: Endoscopy;  Laterality: Left;  . RADIOACTIVE SEED GUIDED MASTECTOMY WITH AXILLARY SENTINEL LYMPH NODE BIOPSY Right 07/17/2015   Procedure: RADIOACTIVE SEED GUIDED PARTIAL MASTECTOMY WITH AXILLARY SENTINEL LYMPH NODE BIOPSY;  Surgeon: Erroll Luna, MD;  Location: [facility];  Service: General;  Laterality: Right;    There were no vitals filed for this visit.      Subjective Assessment - 11/19/15 0848    Subjective No falls since last visit;  wants to use rollator;  feels like she's standing better today          Therapeutic  exercise ' Sit to stand x 10 resp; sba; no hand assist on descent; stood each time approx 15 seconds to establish standing balance w/o UE support  TUG  1:05 with FWW  Amb 115 '  X 2 bouts with fww/ gait belt  ; each bout took  5 minutes                         PT Education - 11/19/15 0935    Education Details recommended continued use of FWW with ambulation vs rolllator; she is not stable enough for rollator          PT Short Term Goals - 11/04/15 2033      PT SHORT TERM GOAL #1   Title Pt will initiate HEP for strengthening and balance in order to improve functional mobility.  (Target Date: 11/28/15)   Status New     PT SHORT TERM GOAL #2   Title Pt will improve TUG score to <80 seconds in order to indicate decreased fall risk.     Status New     PT SHORT TERM GOAL #3   Title Will assess BERG and improve score by 4 points in order to indicate decreased fall risk and functional improvement in balance.     Baseline 11/04/15: 12/56 baseline Berg Balance test score   Status New     PT SHORT TERM  GOAL #4   Title Pt will ambulate over indoor surfaces x 150' w/ RW at S level in order to indicate safe negotiation in home.       PT SHORT TERM GOAL #5   Title Pt will verbalize fall prevention strategies in order to indicate decreased fall risk inside and outside of home.       PT SHORT TERM GOAL #6   Title Pt will perform 8/10 sit <>stands with single UE support only at mod I level in order to indicate improved functional strength.             PT Long Term Goals - 11/04/15 2033      PT LONG TERM GOAL #1   Title Pt will be independent with HEP in order to indicate improved functional mobility and decreased fall risk.  (Target Date: 12/26/15)   Status New     PT LONG TERM GOAL #2   Title Pt will improve TUG score to <40 secs in order to indicate decreased fall risk.     Status New     PT LONG TERM GOAL #3   Title Pt will improve BERG balance score 8 points  from baseline in order to indicate decreased fall risk.     Baseline 11/04/15: baseline Berg balance score is 12/56   Status New     PT LONG TERM GOAL #4   Title Pt will perform 8/10 sit<>stand without UE support at mod I level in order to indicate improved functional strength.     Status New     PT LONG TERM GOAL #5   Title Pt will ambulate 300' w/ LRAD over paved outdoor surfaces at mod I level in order to indicate safe return to community.       PT LONG TERM GOAL #6   Title Pt will negotiate up/down 5 steps w/ single rail at mod I level in order to indicate safe entry into home/family's homes.     Status New             Patient will benefit from skilled therapeutic intervention in order to improve the following deficits and impairments:     Visit Diagnosis: Other lack of coordination  Muscle weakness (generalized)  Unsteadiness on feet  Other abnormalities of gait and mobility     Problem List Patient Active Problem List   Diagnosis Date Noted  . Cerebral thrombosis with cerebral infarction 09/07/2015  . Lacunar infarct, acute ([place])   . Nausea & vomiting 09/05/2015  . Acute kidney injury ([place]) 09/04/2015  . Type II diabetes mellitus with neurological manifestations, uncontrolled ([place]) 09/03/2015  . Bilateral lower extremity edema 09/03/2015  . Essential hypertension   . Diabetes mellitus type 2 in nonobese ([facility])   . History of breast cancer   . AKI (acute kidney injury) ([place])   . Acute blood loss anemia   . Hemiparesis ([place])   . Gait disturbance, post-stroke   . Dysphagia, post-stroke   . Acute ischemic stroke/right cerebellar 08/24/2015  . Breast cancer of upper-outer quadrant of right female breast ([place]) 04/30/2015  . CVA (cerebral infarction) 07/05/2014  . Hyperlipidemia 07/05/2014  . Syncope 07/04/2014  . Mild mitral regurgitation by prior echocardiogram 12/20/2011  . Diabetes mellitus type 1, uncontrolled, insulin dependent ([place]) 02/26/2009  .  OBESITY 02/26/2009  . Uncontrolled hypertension 02/26/2009  . CAD (coronary artery disease) 02/26/2009  . GASTROESOPHAGEAL REFLUX DISEASE 02/26/2009    Rosaura Carpenter D PT DPT  11/19/2015, 9:37 AM  [facility] [address] [place], [place], [address] Phone: [phone]   Fax:  [phone]  Name: Zillah Duttry MRN: [id number] Date of Birth: [birth year]

## 2015-11-19 NOTE — Therapy (Signed)
[facility] [address] [place], [place], [address] Phone: [phone]   Fax:  [phone]  Occupational Therapy Treatment  Patient Details  Name: Glenda [name] MRN: [id number] Date of Birth: [birth year] Referring Provider: Dr. Donnie Coffin  Encounter Date: 11/19/2015      OT End of Session - 11/19/15 0825    Visit Number 5   Number of Visits 17   Date for OT Re-Evaluation 01/04/16   Authorization Type UHC MCR   Authorization - Visit Number 5   Authorization - Number of Visits 10   OT Start Time 0805   OT Stop Time 0845   OT Time Calculation (min) 40 min   Activity Tolerance Patient tolerated treatment well   Behavior During Therapy [facility] for tasks assessed/performed      Past Medical History:  Diagnosis Date  . Arthritis   . Breast cancer ([place])   . CAD (coronary artery disease)   . Chest pain   . Dizziness   . DM (diabetes mellitus) ([place])   . GERD (gastroesophageal reflux disease)   . HLD (hyperlipidemia)   . HTN (hypertension)   . Obesity   . Shortness of breath dyspnea   . Stroke ([place])   . UTI (urinary tract infection)     Past Surgical History:  Procedure Laterality Date  . CHOLECYSTECTOMY    . colon polyps; hx    . CORONARY ANGIOPLASTY  02/14/09  . ESOPHAGOGASTRODUODENOSCOPY (EGD) WITH PROPOFOL Left 09/08/2015   Procedure: ESOPHAGOGASTRODUODENOSCOPY (EGD) WITH PROPOFOL;  Surgeon: Clarene Essex, MD;  Location: [facility] ENDOSCOPY;  Service: Endoscopy;  Laterality: Left;  . RADIOACTIVE SEED GUIDED MASTECTOMY WITH AXILLARY SENTINEL LYMPH NODE BIOPSY Right 07/17/2015   Procedure: RADIOACTIVE SEED GUIDED PARTIAL MASTECTOMY WITH AXILLARY SENTINEL LYMPH NODE BIOPSY;  Surgeon: Erroll Luna, MD;  Location: [facility];  Service: General;  Laterality: Right;    There were no vitals filed for this visit.      Subjective Assessment - 11/19/15 0812    Subjective  Pt reports ear pain   Pertinent History CVA 08/24/15    Patient Stated Goals To do everything I did before   Currently in Pain? Yes   Pain Score 9    Pain Location Ear   Pain Orientation Right   Pain Descriptors / Indicators Aching   Pain Type Acute pain   Pain Onset In the past 7 days   Pain Frequency Constant   Aggravating Factors  unknown   Pain Relieving Factors medicine   Multiple Pain Sites No             Treatment: Supine reviewed HEP with ball for shoulder flexion and chest press, 2 sets of 10 reps, min facilitation/ v.c. Seated edge of mat Functional mid range reaching to place large pegs in vertical pegboard with RUE, min difficulty/v.c  Pt fatigued quickly requiring rest break after each row.Removing pegs with trunk rotation, min v.c. Seated closed chain shoulder flexion 10 reps, mod facilitation.                   OT Short Term Goals - 11/06/15 1243      OT SHORT TERM GOAL #1   Title I with inital HEP.   Time 4   Period Weeks   Status New     OT SHORT TERM GOAL #2   Title Pt will demonstrate ability to retrieve a lightweight object at 110* with RUE.   Time 4   Period Weeks  Status New     OT SHORT TERM GOAL #3   Title Pt will demonstrate improved fine motor coordination as evidenced by decreasing 9 hole peg test score by 4 secs bilaterally.   Baseline RUE 59 secs, LUE 42.40 secs   Time 4   Period Weeks   Status New     OT SHORT TERM GOAL #4   Title Pt will perform simple snack prep or light home management task in standing with min A without LOB demonstrating good safety awareness.   Time 4   Period Weeks   Status New     OT SHORT TERM GOAL #5   Title Pt will demonstrate ability to write a short paragraph with 95% legibility.   Time 4   Period Weeks   Status New     Additional Short Term Goals   Additional Short Term Goals Yes     OT SHORT TERM GOAL #6   Title Further assess cognition and set goal PRN   Time 4   Period Weeks   Status New           OT Long Term Goals -  11/06/15 1247      OT LONG TERM GOAL #1   Title I with updated HEP.   Time 8   Period Weeks   Status New     OT LONG TERM GOAL #2   Title Pt will perform simple cooking task in standing with supervision demonstrating good safety awareness.   Time 8   Period Weeks   Status New     OT LONG TERM GOAL #3   Title Pt will demonstrate ability to retrieve a light weight object at 120 shoulder flexion with RUE   Time 8   Period Weeks   Status New     OT LONG TERM GOAL #4   Title Pt will resume use of RUE as dominant hand at least 75% of the time for ADLs/IADLs.   Time 8   Period Weeks   Status New     OT LONG TERM GOAL #5   Title Pt will decrease RUE 9 hole peg test score to 49 secs from for increased fine motor coordination.   Baseline RUE 59 secs   Time 8     Long Term Additional Goals   Additional Long Term Goals Yes     OT LONG TERM GOAL #6   Title Pt will be modified indpendent with all basic ADLs.   Time 8   Period Weeks   Status New               Plan - 11/19/15 [id number]    Clinical Impression Statement Pt demonstrates improved RUE fine motor coordinationa and strength.   Rehab Potential Good   OT Frequency 2x / week   OT Duration 8 weeks   OT Treatment/Interventions Self-care/ADL training;Therapeutic exercise;Patient/family education;Functional Mobility Training;Neuromuscular education;Manual Therapy;Therapeutic exercises;DME and/or AE instruction;Therapeutic activities;Electrical Stimulation;Fluidtherapy;Cognitive remediation/compensation;Moist Heat;Passive range of motion;Visual/perceptual remediation/compensation   Plan simple snack prep at a w/c level, RUE functional use   Consulted and Agree with Plan of Care Patient;Family member/caregiver   Family Member Consulted daughter      Patient will benefit from skilled therapeutic intervention in order to improve the following deficits and impairments:  Abnormal gait, Decreased coordination, Decreased range of  motion, Difficulty walking, Decreased safety awareness, Decreased endurance, Decreased activity tolerance, Decreased knowledge of precautions, Impaired UE functional use, Decreased knowledge of use of DME, Decreased balance, Decreased  cognition, Decreased mobility, Decreased strength, Impaired perceived functional ability  Visit Diagnosis: Muscle weakness (generalized)  Other lack of coordination  Unsteadiness on feet  Other abnormalities of gait and mobility    Problem List Patient Active Problem List   Diagnosis Date Noted  . Cerebral thrombosis with cerebral infarction 09/07/2015  . Lacunar infarct, acute ([place])   . Nausea & vomiting 09/05/2015  . Acute kidney injury ([place]) 09/04/2015  . Type II diabetes mellitus with neurological manifestations, uncontrolled ([place]) 09/03/2015  . Bilateral lower extremity edema 09/03/2015  . Essential hypertension   . Diabetes mellitus type 2 in nonobese ([facility])   . History of breast cancer   . AKI (acute kidney injury) ([place])   . Acute blood loss anemia   . Hemiparesis ([place])   . Gait disturbance, post-stroke   . Dysphagia, post-stroke   . Acute ischemic stroke/right cerebellar 08/24/2015  . Breast cancer of upper-outer quadrant of right female breast ([place]) 04/30/2015  . CVA (cerebral infarction) 07/05/2014  . Hyperlipidemia 07/05/2014  . Syncope 07/04/2014  . Mild mitral regurgitation by prior echocardiogram 12/20/2011  . Diabetes mellitus type 1, uncontrolled, insulin dependent ([place]) 02/26/2009  . OBESITY 02/26/2009  . Uncontrolled hypertension 02/26/2009  . CAD (coronary artery disease) 02/26/2009  . GASTROESOPHAGEAL REFLUX DISEASE 02/26/2009    RINE,KATHRYN 11/19/2015, 8:26 AM Theone Murdoch, OTR/L [phone] Phone: [phone] 8:31 AM 11/19/15 [facility] [address] [place], [place], [address] Phone: [phone]   Fax:  [phone]  Name: Sherriann Landino MRN: [id number] Date of Birth: 1934-02-15

## 2015-11-20 ENCOUNTER — Ambulatory Visit: Payer: Self-pay | Admitting: Radiation Oncology

## 2015-11-24 ENCOUNTER — Encounter: Payer: Self-pay | Admitting: Oncology

## 2015-11-25 ENCOUNTER — Ambulatory Visit: Payer: Medicare Other | Attending: Family Medicine | Admitting: Physical Therapy

## 2015-11-25 ENCOUNTER — Encounter: Payer: Self-pay | Admitting: Physical Therapy

## 2015-11-25 ENCOUNTER — Ambulatory Visit: Payer: Medicare Other | Admitting: Occupational Therapy

## 2015-11-25 DIAGNOSIS — R2681 Unsteadiness on feet: Secondary | ICD-10-CM | POA: Diagnosis present

## 2015-11-25 DIAGNOSIS — R278 Other lack of coordination: Secondary | ICD-10-CM

## 2015-11-25 DIAGNOSIS — R2689 Other abnormalities of gait and mobility: Secondary | ICD-10-CM | POA: Diagnosis present

## 2015-11-25 DIAGNOSIS — G8190 Hemiplegia, unspecified affecting unspecified side: Secondary | ICD-10-CM | POA: Insufficient documentation

## 2015-11-25 DIAGNOSIS — M6281 Muscle weakness (generalized): Secondary | ICD-10-CM

## 2015-11-25 DIAGNOSIS — M25611 Stiffness of right shoulder, not elsewhere classified: Secondary | ICD-10-CM | POA: Insufficient documentation

## 2015-11-25 DIAGNOSIS — R471 Dysarthria and anarthria: Secondary | ICD-10-CM | POA: Diagnosis present

## 2015-11-25 NOTE — Therapy (Signed)
[facility] [address] [place], [place], [address] Phone: [phone]   Fax:  [phone]  Occupational Therapy Treatment  Patient Details  Name: Glenda [name] MRN: [id number] Date of Birth: 11-29-1933 Referring Provider: Dr. Donnie Coffin  Encounter Date: 11/25/2015      OT End of Session - 11/25/15 1408    Visit Number 6   Number of Visits 17   Date for OT Re-Evaluation 01/04/16   Authorization Type [employer] MCR   Authorization - Visit Number 6   Authorization - Number of Visits 10   OT Start Time 1093   OT Stop Time 1400   OT Time Calculation (min) 45 min   Activity Tolerance Patient tolerated treatment well      Past Medical History:  Diagnosis Date  . Arthritis   . Breast cancer ([place])   . CAD (coronary artery disease)   . Chest pain   . Dizziness   . DM (diabetes mellitus) ([place])   . GERD (gastroesophageal reflux disease)   . HLD (hyperlipidemia)   . HTN (hypertension)   . Obesity   . Radiation 09/11/15-10/13/15   42.72 Gy to right breast, 12 Gy boost to right breast  . Shortness of breath dyspnea   . Stroke ([place])   . UTI (urinary tract infection)     Past Surgical History:  Procedure Laterality Date  . CHOLECYSTECTOMY    . colon polyps; hx    . CORONARY ANGIOPLASTY  02/14/09  . ESOPHAGOGASTRODUODENOSCOPY (EGD) WITH PROPOFOL Left 09/08/2015   Procedure: ESOPHAGOGASTRODUODENOSCOPY (EGD) WITH PROPOFOL;  Surgeon: Clarene Essex, MD;  Location: [facility] END[facility]COPY;  Service: Endoscopy;  Laterality: Left;  . RADIOACTIVE SEED GUIDED MASTECTOMY WITH AXILLARY SENTINEL LYMPH NODE BIOPSY Right 07/17/2015   Procedure: RADIOACTIVE SEED GUIDED PARTIAL MASTECTOMY WITH AXILLARY SENTINEL LYMPH NODE BIOPSY;  Surgeon: Erroll Luna, MD;  Location: [facility];  Service: General;  Laterality: Right;    There were no vitals filed for this visit.      Subjective Assessment - 11/25/15 1403    Subjective  I didn't know I could do  this   Pertinent History CVA 08/24/15   Patient Stated Goals To do everything I did before   Currently in Pain? No/denies                      OT Treatments/Exercises (OP) - 11/25/15 0001      ADLs   Cooking Pt made PB sandwich retrieving items from refrigerator and transporting items while seated in w/c. Pt stood to retrieve items out of higher cabinets (plate, PB) and stood to make sandwich with v.c's for w/c set and safety (one hand support on counter for fall prevention). Pt performed task with supervision only for standing and v.c's     Functional Reaching Activities   Mid Level Pt standing to place large pegs in pegboard on vertical surface requiring approx. 90* shoulder flexion RUE: for ROM, coordination, RUE endurance, and balance.                   OT Short Term Goals - 11/25/15 1408      OT SHORT TERM GOAL #1   Title I with inital HEP.   Time 4   Period Weeks   Status Achieved     OT SHORT TERM GOAL #2   Title Pt will demonstrate ability to retrieve a lightweight object at 110* with RUE.   Time 4   Period Weeks  Status On-going     OT SHORT TERM GOAL #3   Title Pt will demonstrate improved fine motor coordination as evidenced by decreasing 9 hole peg test score by 4 secs bilaterally.   Baseline RUE 59 secs, LUE 42.40 secs   Time 4   Period Weeks   Status On-going     OT SHORT TERM GOAL #4   Title Pt will perform simple snack prep or light home management task in standing with min A without LOB demonstrating good safety awareness.   Time 4   Period Weeks   Status Achieved  in clinic     OT SHORT TERM GOAL #5   Title Pt will demonstrate ability to write a short paragraph with 95% legibility.   Time 4   Period Weeks   Status On-going     OT SHORT TERM GOAL #6   Title Further assess cognition and set goal PRN   Time 4   Period Weeks   Status New           OT Long Term Goals - 11/06/15 1247      OT LONG TERM GOAL #1   Title  I with updated HEP.   Time 8   Period Weeks   Status New     OT LONG TERM GOAL #2   Title Pt will perform simple cooking task in standing with supervision demonstrating good safety awareness.   Time 8   Period Weeks   Status New     OT LONG TERM GOAL #3   Title Pt will demonstrate ability to retrieve a light weight object at 120 shoulder flexion with RUE   Time 8   Period Weeks   Status New     OT LONG TERM GOAL #4   Title Pt will resume use of RUE as dominant hand at least 75% of the time for ADLs/IADLs.   Time 8   Period Weeks   Status New     OT LONG TERM GOAL #5   Title Pt will decrease RUE 9 hole peg test score to 49 secs from for increased fine motor coordination.   Baseline RUE 59 secs   Time 8     Long Term Additional Goals   Additional Long Term Goals Yes     OT LONG TERM GOAL #6   Title Pt will be modified indpendent with all basic ADLs.   Time 8   Period Weeks   Status New               Plan - 11/25/15 1409    Clinical Impression Statement Pt met STG's #1 and #4. Pt approximating other goals. Pt with increased endurance RUE today (placing 2 rows of pegs w/o rest).    Rehab Potential Good   OT Frequency 2x / week   OT Duration 8 weeks   OT Treatment/Interventions Self-care/ADL training;Therapeutic exercise;Patient/family education;Functional Mobility Training;Neuromuscular education;Manual Therapy;Therapeutic exercises;DME and/or AE instruction;Therapeutic activities;Electrical Stimulation;Fluidtherapy;Cognitive remediation/compensation;Moist Heat;Passive range of motion;Visual/perceptual remediation/compensation   Plan continue [facility], RUE reaching, writing to address remaining STG's      Patient will benefit from skilled therapeutic intervention in order to improve the following deficits and impairments:  Abnormal gait, Decreased coordination, Decreased range of motion, Difficulty walking, Decreased safety awareness, Decreased endurance, Decreased  activity tolerance, Decreased knowledge of precautions, Impaired UE functional use, Decreased knowledge of use of DME, Decreased balance, Decreased cognition, Decreased mobility, Decreased strength, Impaired perceived functional ability  Visit Diagnosis: Muscle weakness (generalized)  Other lack of coordination  Unsteadiness on feet    Problem List Patient Active Problem List   Diagnosis Date Noted  . Cerebral thrombosis with cerebral infarction 09/07/2015  . Lacunar infarct, acute ([place])   . Nausea & vomiting 09/05/2015  . Acute kidney injury ([place]) 09/04/2015  . Type II diabetes mellitus with neurological manifestations, uncontrolled ([place]) 09/03/2015  . Bilateral lower extremity edema 09/03/2015  . Essential hypertension   . Diabetes mellitus type 2 in nonobese ([facility])   . History of breast cancer   . AKI (acute kidney injury) ([place])   . Acute blood loss anemia   . Hemiparesis ([place])   . Gait disturbance, post-stroke   . Dysphagia, post-stroke   . Acute ischemic stroke/right cerebellar 08/24/2015  . Breast cancer of upper-outer quadrant of right female breast ([place]) 04/30/2015  . CVA (cerebral infarction) 07/05/2014  . Hyperlipidemia 07/05/2014  . Syncope 07/04/2014  . Mild mitral regurgitation by prior echocardiogram 12/20/2011  . Diabetes mellitus type 1, uncontrolled, insulin dependent ([place]) 02/26/2009  . OBESITY 02/26/2009  . Uncontrolled hypertension 02/26/2009  . CAD (coronary artery disease) 02/26/2009  . GASTROESOPHAGEAL REFLUX DISEASE 02/26/2009    Carey Bullocks, OTR/L 11/25/2015, 2:15 PM  [facility] [address] [place], [place], [address] Phone: [phone]   Fax:  [phone]  Name: Quandra Fedorchak MRN: [id number] Date of Birth: 03/29/1934

## 2015-11-26 NOTE — Therapy (Signed)
[facility] [address] [place], [place], [address] Phone: [phone]   Fax:  [phone]  Physical Therapy Treatment  Patient Details  Name: Glenda [name] MRN: [id number] Date of Birth: [birth year] Referring Provider: Donnie Coffin, MD  Encounter Date: 11/25/2015      PT End of Session - 11/25/15 1239    Visit Number 8   Number of Visits 17   PT Start Time 9449   PT Stop Time 1315   PT Time Calculation (min) 42 min   Equipment Utilized During Treatment Gait belt   Activity Tolerance Patient tolerated treatment well;Patient limited by fatigue   Behavior During Therapy [facility] for tasks assessed/performed      Past Medical History:  Diagnosis Date  . Arthritis   . Breast cancer ([place])   . CAD (coronary artery disease)   . Chest pain   . Dizziness   . DM (diabetes mellitus) ([place])   . GERD (gastroesophageal reflux disease)   . HLD (hyperlipidemia)   . HTN (hypertension)   . Obesity   . Radiation 09/11/15-10/13/15   42.72 Gy to right breast, 12 Gy boost to right breast  . Shortness of breath dyspnea   . Stroke ([place])   . UTI (urinary tract infection)     Past Surgical History:  Procedure Laterality Date  . CHOLECYSTECTOMY    . colon polyps; hx    . CORONARY ANGIOPLASTY  02/14/09  . ESOPHAGOGASTRODUODENOSCOPY (EGD) WITH PROPOFOL Left 09/08/2015   Procedure: ESOPHAGOGASTRODUODENOSCOPY (EGD) WITH PROPOFOL;  Surgeon: Clarene Essex, MD;  Location: [facility] ENDOSCOPY;  Service: Endoscopy;  Laterality: Left;  . RADIOACTIVE SEED GUIDED MASTECTOMY WITH AXILLARY SENTINEL LYMPH NODE BIOPSY Right 07/17/2015   Procedure: RADIOACTIVE SEED GUIDED PARTIAL MASTECTOMY WITH AXILLARY SENTINEL LYMPH NODE BIOPSY;  Surgeon: Erroll Luna, MD;  Location: [facility];  Service: General;  Laterality: Right;    There were no vitals filed for this visit.      Subjective Assessment - 11/25/15 1238    Subjective No new complaints. No falls or pain  to report.    Patient is accompained by: Family member   Limitations Walking;House hold activities   Patient Stated Goals "I want to work on walking"    Currently in Pain? No/denies   Pain Score 0-No pain            [facility] Adult PT Treatment/Exercise - 11/25/15 1302      Transfers   Transfers Sit to Stand;Stand to [employer] Transfers     Ambulation/Gait   Ambulation/Gait Yes   Ambulation/Gait Assistance 5: Supervision;4: Min guard;4: Min assist   Ambulation Distance (Feet) 150 Feet    x1 ,115x1   Assistive device Rolling walker  added simulated toe cap for second lap   Gait Pattern Step-through pattern;Decreased stride length;Decreased stance time - right;Decreased step length - right;Decreased step length - left;Decreased weight shift to right;Decreased hip/knee flexion - right;Narrow base of support   Ambulation Surface Level;Indoor     Timed Up and Go Test   TUG Normal TUG   Normal TUG (seconds) 52.44  with RW and simulated toe cap             PT Short Term Goals - 11/25/15 1242      PT SHORT TERM GOAL #1   Title Pt will initiate HEP for strengthening and balance in order to improve functional mobility.  (Target Date: 11/28/15)   Status On-going     PT SHORT TERM GOAL #2  Title Pt will improve TUG score to <80 seconds in order to indicate decreased fall risk.     Baseline 11/25/15: 52.44 sec's with RW and simulated toe cap    Status Achieved     PT SHORT TERM GOAL #3   Title Will assess BERG and improve score by 4 points in order to indicate decreased fall risk and functional improvement in balance.     Baseline 11/04/15: 12/56 baseline Berg Balance test score   Status On-going     PT SHORT TERM GOAL #4   Title Pt will ambulate over indoor surfaces x 150' w/ RW at S level in order to indicate safe negotiation in home.     Baseline 11/25/15: pt able to ambulate this distance with varied amout of assistance (min guard to min assist)   Status Partially Met      PT SHORT TERM GOAL #5   Title Pt will verbalize fall prevention strategies in order to indicate decreased fall risk inside and outside of home.     Status On-going     PT SHORT TERM GOAL #6   Title Pt will perform 8/10 sit <>stands with single UE support only at mod I level in order to indicate improved functional strength.     Status On-going           PT Long Term Goals - 11/04/15 2033      PT LONG TERM GOAL #1   Title Pt will be independent with HEP in order to indicate improved functional mobility and decreased fall risk.  (Target Date: 12/26/15)   Status New     PT LONG TERM GOAL #2   Title Pt will improve TUG score to <40 secs in order to indicate decreased fall risk.     Status New     PT LONG TERM GOAL #3   Title Pt will improve BERG balance score 8 points from baseline in order to indicate decreased fall risk.     Baseline 11/04/15: baseline Berg balance score is 12/56   Status New     PT LONG TERM GOAL #4   Title Pt will perform 8/10 sit<>stand without UE support at mod I level in order to indicate improved functional strength.     Status New     PT LONG TERM GOAL #5   Title Pt will ambulate 300' w/ LRAD over paved outdoor surfaces at mod I level in order to indicate safe return to community.       PT LONG TERM GOAL #6   Title Pt will negotiate up/down 5 steps w/ single rail at mod I level in order to indicate safe entry into home/family's homes.     Status New             Plan - 11/25/15 1240    Clinical Impression Statement Pt met one STG and partially met another one. Will check remaining STGs next visit. Pt continues to have increased incidence of right leg/foot scuffing/sticking with gait. Trialed simualted toe cuff today with significant improvement noted in gait and foot advancement. Reinforced education on speaking to MD reguarding the right leg tightness/tone as pt's daughter once again mentioned this and was asking why her leg was so tight.                        Rehab Potential Excellent   PT Frequency 2x / week   PT Duration 8 weeks   PT Treatment/Interventions  ADLs/Self Care Home Management;Electrical Stimulation;DME Instruction;Gait training;Stair training;Functional mobility training;Therapeutic activities;Therapeutic exercise;Balance training;Neuromuscular re-education;Patient/family education;Orthotic Fit/Training;Vestibular;Visual/perceptual remediation/compensation   PT Next Visit Plan check remaining STGs; add single leg stance, tandem stance and feet together with EO/EC in corner to HEP when appropriate; work on gait with walker, endurance   Consulted and Agree with Plan of Care Patient;Family member/caregiver   Family Member Consulted daughter [name]      Patient will benefit from skilled therapeutic intervention in order to improve the following deficits and impairments:  Abnormal gait, Decreased activity tolerance, Decreased balance, Decreased coordination, Decreased endurance, Decreased knowledge of use of DME, Decreased mobility, Decreased strength, Dizziness, Increased edema, Impaired perceived functional ability, Impaired flexibility, Impaired UE functional use, Impaired vision/preception, Improper body mechanics, Postural dysfunction  Visit Diagnosis: Muscle weakness (generalized)  Other lack of coordination  Unsteadiness on feet  Other abnormalities of gait and mobility     Problem List Patient Active Problem List   Diagnosis Date Noted  . Cerebral thrombosis with cerebral infarction 09/07/2015  . Lacunar infarct, acute ([place])   . Nausea & vomiting 09/05/2015  . Acute kidney injury ([place]) 09/04/2015  . Type II diabetes mellitus with neurological manifestations, uncontrolled ([place]) 09/03/2015  . Bilateral lower extremity edema 09/03/2015  . Essential hypertension   . Diabetes mellitus type 2 in nonobese ([facility])   . History of breast cancer   . AKI (acute kidney injury) ([place])   . Acute blood loss anemia   .  Hemiparesis ([place])   . Gait disturbance, post-stroke   . Dysphagia, post-stroke   . Acute ischemic stroke/right cerebellar 08/24/2015  . Breast cancer of upper-outer quadrant of right female breast ([place]) 04/30/2015  . CVA (cerebral infarction) 07/05/2014  . Hyperlipidemia 07/05/2014  . Syncope 07/04/2014  . Mild mitral regurgitation by prior echocardiogram 12/20/2011  . Diabetes mellitus type 1, uncontrolled, insulin dependent ([place]) 02/26/2009  . OBESITY 02/26/2009  . Uncontrolled hypertension 02/26/2009  . CAD (coronary artery disease) 02/26/2009  . GASTROESOPHAGEAL REFLUX DISEASE 02/26/2009    Willow Ora, PTA, [facility] [address] [place], [place] [address] [phone] 11/26/15, 4:02 PM  Name: Annaly Skop MRN: [id number] Date of Birth: 09/19/1933

## 2015-11-27 ENCOUNTER — Encounter: Payer: Self-pay | Admitting: Physical Therapy

## 2015-11-27 ENCOUNTER — Ambulatory Visit: Payer: Medicare Other | Admitting: Physical Therapy

## 2015-11-27 ENCOUNTER — Encounter: Payer: Self-pay | Admitting: Radiation Oncology

## 2015-11-27 ENCOUNTER — Ambulatory Visit: Payer: Medicare Other | Admitting: Occupational Therapy

## 2015-11-27 ENCOUNTER — Ambulatory Visit
Admission: RE | Admit: 2015-11-27 | Discharge: 2015-11-27 | Disposition: A | Discharge status: Home / Self Care | Payer: Medicare Other | Source: Ambulatory Visit | Attending: Radiation Oncology | Admitting: Radiation Oncology

## 2015-11-27 DIAGNOSIS — C50411 Malignant neoplasm of upper-outer quadrant of right female breast: Secondary | ICD-10-CM | POA: Diagnosis present

## 2015-11-27 DIAGNOSIS — R2689 Other abnormalities of gait and mobility: Secondary | ICD-10-CM

## 2015-11-27 DIAGNOSIS — R278 Other lack of coordination: Secondary | ICD-10-CM

## 2015-11-27 DIAGNOSIS — Y842 Radiological procedure and radiotherapy as the cause of abnormal reaction of the patient, or of later complication, without mention of misadventure at the time of the procedure: Secondary | ICD-10-CM | POA: Diagnosis not present

## 2015-11-27 DIAGNOSIS — M6281 Muscle weakness (generalized): Secondary | ICD-10-CM | POA: Diagnosis not present

## 2015-11-27 DIAGNOSIS — R2681 Unsteadiness on feet: Secondary | ICD-10-CM

## 2015-11-27 NOTE — Patient Instructions (Signed)
Functional Quadriceps: Sit to Stand    Sit on edge of chair facing kitchen counter top, feet flat on floor. Stand upright, extending knees fully. Use armrests on chair with standing up and to slow you down with sitting. Use counter top to catch balance as needed. Use only one arm on armrest to stand up and sit down, emphasizing slow controlled motion with sitting down.  Repeat _10_ times per set. Do _1 sets per session. Do _1-2_ sessions per day.  http://orth.exer.us/735     Copyright  VHI. All rights reserved.  Toe / Heel Raise (Standing)    Standing with support at kitchen sink, raise heels, hold for 5 seconds before lowering heel back down to ground, then rock back on heels and raise toes, hold for 5 seconds before lowering toes back down., Repeat __10_ times.  Copyright  VHI. All rights reserved.    Hip Side Kick    Holding onto kitchen counter/sink for balance, keep legs shoulder width apart and toes pointed forward. Swing a leg out to side, keeping knee straight. Do not lean. Repeat using other leg, alternating legs.  Repeat __10__ times. Do __1-2__ sessions per day.  http://gt2.exer.us/343   Copyright  VHI. All rights reserved.   Side-Stepping    At kitchen counter top: Walk to left side with eyes open. Take even steps, leading with same foot. Make sure each foot lifts off the floor. Repeat in opposite direction (toward right side). Repeat for 3 laps each way. Do __1-2__ sessions per day.   Copyright  VHI. All rights reserved.

## 2015-11-27 NOTE — Progress Notes (Addendum)
Mrs. Maui Caudillo  is here for a one month  follow up visit for breast cancer to right breast.  Skin status: Mild hyperpigmentation to right breast. Lotion being used: Using Radiaplex gel to right breast at times Have you seen your medical oncologist? Date If not ,when is appointment No medical oncologist When is yiur next mammogram? Has it been scheduled ? : Not scheduled yet ER+,have started AI or Tamoxifen? If not, why?  No Discuss survivorship appointment:Not scheduled Offer referral reading material for Survivorship, [employer] and [facility] given Not scheduled Appetite: Good Pain: None Arm mobility:Able to raise right arm without difficulty Fatigue:No Recent stroke in May 2017 feels that she is recovering well. BP (!) 169/48 (BP Location: Left Arm, Patient Position: Sitting, Cuff Size: Normal)   Pulse 61   Temp 97.6 F (36.4 C) (Oral)   Resp 16   Ht 5\' 6"  (1.676 m)   Wt 155 lb (70.3 kg)   SpO2 100%   BMI 25.02 kg/m

## 2015-11-27 NOTE — Therapy (Signed)
[facility] [address] [place], [place], [address] Phone: [phone]   Fax:  [phone]  Occupational Therapy Treatment  Patient Details  Name: Glenda [name] MRN: [id number] Date of Birth: [birth year] Referring Provider: Dr. Donnie Coffin  Encounter Date: 11/27/2015      OT End of Session - 11/27/15 1552    Visit Number 7   Number of Visits 17   Date for OT Re-Evaluation 01/04/16   Authorization Type UHC MCR   Authorization - Visit Number 7   Authorization - Number of Visits 10   OT Start Time 1450   OT Stop Time 1530   OT Time Calculation (min) 40 min   Activity Tolerance Patient tolerated treatment well   Behavior During Therapy [facility] for tasks assessed/performed      Past Medical History:  Diagnosis Date  . Arthritis   . Breast cancer ([place])   . CAD (coronary artery disease)   . Chest pain   . Dizziness   . DM (diabetes mellitus) ([place])   . GERD (gastroesophageal reflux disease)   . HLD (hyperlipidemia)   . HTN (hypertension)   . Obesity   . Radiation 09/11/15-10/13/15   42.72 Gy to right breast, 12 Gy boost to right breast  . Shortness of breath dyspnea   . Stroke ([place])   . UTI (urinary tract infection)     Past Surgical History:  Procedure Laterality Date  . CHOLECYSTECTOMY    . colon polyps; hx    . CORONARY ANGIOPLASTY  02/14/09  . ESOPHAGOGASTRODUODENOSCOPY (EGD) WITH PROPOFOL Left 09/08/2015   Procedure: ESOPHAGOGASTRODUODENOSCOPY (EGD) WITH PROPOFOL;  Surgeon: Clarene Essex, MD;  Location: [facility] ENDOSCOPY;  Service: Endoscopy;  Laterality: Left;  . RADIOACTIVE SEED GUIDED MASTECTOMY WITH AXILLARY SENTINEL LYMPH NODE BIOPSY Right 07/17/2015   Procedure: RADIOACTIVE SEED GUIDED PARTIAL MASTECTOMY WITH AXILLARY SENTINEL LYMPH NODE BIOPSY;  Surgeon: Erroll Luna, MD;  Location: [facility];  Service: General;  Laterality: Right;    There were no vitals filed for this visit.      Subjective Assessment  - 11/27/15 1520    Pertinent History CVA 08/24/15   Patient Stated Goals To do everything I did before   Currently in Pain? No/denies            Seated closed chain shoulder flexion AA/ROM with RUE using PVC pipe frame Midlevel reaching in seated and standing to place graded clothespins on vertical antennae with RUE min difficulty/v.c to avoid compensation,close supervision in standing for safety and improved balance. Placing grooved pegs into small pegboard with RUE, min-mod difficulty, removing with in hand manipulation, min v.c. To avoid compensation. Handwriting activities for increased RUE control and legibility. Pt demonstrates grossly 75-80%legibility.                     OT Short Term Goals - 11/27/15 1527      OT SHORT TERM GOAL #1   Title I with inital HEP.   Time 4   Period Weeks   Status Achieved     OT SHORT TERM GOAL #2   Title Pt will demonstrate ability to retrieve a lightweight object at 110* with RUE.   Time 4   Period Weeks   Status On-going     OT SHORT TERM GOAL #3   Title Pt will demonstrate improved fine motor coordination as evidenced by decreasing 9 hole peg test score by 4 secs bilaterally.   Baseline RUE 59 secs, LUE 42.40  secs   Time 4   Period Weeks   Status On-going     OT SHORT TERM GOAL #4   Title Pt will perform simple snack prep or light home management task in standing with min A without LOB demonstrating good safety awareness.   Time 4   Period Weeks   Status Achieved  in clinic     OT SHORT TERM GOAL #5   Title Pt will demonstrate ability to write a short paragraph with 95% legibility.   Time 4   Period Weeks   Status On-going     OT SHORT TERM GOAL #6   Title Further assess cognition and set goal PRN   Time 4   Period Weeks   Status New           OT Long Term Goals - 11/06/15 1247      OT LONG TERM GOAL #1   Title I with updated HEP.   Time 8   Period Weeks   Status New     OT LONG TERM GOAL #2    Title Pt will perform simple cooking task in standing with supervision demonstrating good safety awareness.   Time 8   Period Weeks   Status New     OT LONG TERM GOAL #3   Title Pt will demonstrate ability to retrieve a light weight object at 120 shoulder flexion with RUE   Time 8   Period Weeks   Status New     OT LONG TERM GOAL #4   Title Pt will resume use of RUE as dominant hand at least 75% of the time for ADLs/IADLs.   Time 8   Period Weeks   Status New     OT LONG TERM GOAL #5   Title Pt will decrease RUE 9 hole peg test score to 49 secs from for increased fine motor coordination.   Baseline RUE 59 secs   Time 8     Long Term Additional Goals   Additional Long Term Goals Yes     OT LONG TERM GOAL #6   Title Pt will be modified indpendent with all basic ADLs.   Time 8   Period Weeks   Status New               Plan - 11/27/15 1521    Clinical Impression Statement Pt is progressing towards goals with improving balance and UE functional use   Rehab Potential Good   OT Frequency 2x / week   OT Duration 8 weeks   OT Treatment/Interventions Self-care/ADL training;Therapeutic exercise;Patient/family education;Functional Mobility Training;Neuromuscular education;Manual Therapy;Therapeutic exercises;DME and/or AE instruction;Therapeutic activities;Electrical Stimulation;Fluidtherapy;Cognitive remediation/compensation;Moist Heat;Passive range of motion;Visual/perceptual remediation/compensation   Plan continue to work towards short term goals, check STG's next week   Consulted and Agree with Plan of Care Patient;Family member/caregiver      Patient will benefit from skilled therapeutic intervention in order to improve the following deficits and impairments:  Abnormal gait, Decreased coordination, Decreased range of motion, Difficulty walking, Decreased safety awareness, Decreased endurance, Decreased activity tolerance, Decreased knowledge of precautions, Impaired UE  functional use, Decreased knowledge of use of DME, Decreased balance, Decreased cognition, Decreased mobility, Decreased strength, Impaired perceived functional ability  Visit Diagnosis: Muscle weakness (generalized)  Other lack of coordination  Unsteadiness on feet  Other abnormalities of gait and mobility    Problem List Patient Active Problem List   Diagnosis Date Noted  . Cerebral thrombosis with cerebral infarction 09/07/2015  . Lacunar  infarct, acute ([place])   . Nausea & vomiting 09/05/2015  . Acute kidney injury ([place]) 09/04/2015  . Type II diabetes mellitus with neurological manifestations, uncontrolled ([place]) 09/03/2015  . Bilateral lower extremity edema 09/03/2015  . Essential hypertension   . Diabetes mellitus type 2 in nonobese (H[facility])   . History of breast cancer   . AKI (acute kidney injury) ([place])   . Acute blood loss anemia   . Hemiparesis ([place])   . Gait disturbance, post-stroke   . Dysphagia, post-stroke   . Acute ischemic stroke/right cerebellar 08/24/2015  . Breast cancer of upper-outer quadrant of right female breast ([place]) 04/30/2015  . CVA (cerebral infarction) 07/05/2014  . Hyperlipidemia 07/05/2014  . Syncope 07/04/2014  . Mild mitral regurgitation by prior echocardiogram 12/20/2011  . Diabetes mellitus type 1, uncontrolled, insulin dependent ([place]) 02/26/2009  . OBESITY 02/26/2009  . Uncontrolled hypertension 02/26/2009  . CAD (coronary artery disease) 02/26/2009  . GASTROESOPHAGEAL REFLUX DISEASE 02/26/2009    RINE,KATHRYN 11/27/2015, 3:53 PM Theone Murdoch, OTR/L [phone] Phone: [phone] 3:54 PM 11/27/15 [facility] [address] [place], [place], [address] Phone: [phone]   Fax:  [phone]  Name: Tasheen Dimitrov MRN: [id number] Date of Birth: Jul 25, 1933

## 2015-11-27 NOTE — Progress Notes (Signed)
Radiation Oncology         [phone] ________________________________  Name: Glenda [name] MRN: [id number]  Date: 11/27/2015  DOB: 29-Dec-1933  Follow-Up Visit Note  CC: Donnie Coffin, MD  Erroll Luna, MD    ICD-9-CM ICD-10-CM   1. Breast cancer of upper-outer quadrant of right female breast ([place]) 174.4 C50.411     Diagnosis: pT1a, pN0 invasive ductal carcinoma with extensive high grade DCIS of the right breast (triple negative), s/p lumpectomy and sentinel node procedure  Interval Since Last Radiation: 6 weeks  09/11/15 - 10/13/15: The right breast was treated with 42.72 Gy in 16 fractions with a boost of 12 Gy in 6 fractions.  Narrative:  The patient returns today for routine follow-up. She is using radiaplex to the right breast at times. She is able to raise her right arm without difficulty. She denies nipple discharge or bleeding. She denies pain and has a good appetite. She had a stroke in May and is undergoing rehab.      ALLERGIES:  has No Known Allergies.  Meds: Current Outpatient Prescriptions  Medication Sig Dispense Refill  . amLODipine (NORVASC) 10 MG tablet Take 10 mg by mouth daily. Reported on 10/31/2015    . aspirin 81 MG chewable tablet Chew 81 mg by mouth daily.    . clonazePAM (KLONOPIN) 0.5 MG tablet Take 0.5 tablets (0.25 mg total) by mouth 3 (three) times daily as needed for anxiety. 30 tablet 0  . cloNIDine (CATAPRES) 0.2 MG tablet Take 0.2 mg by mouth every 6 (six) hours as needed. BP > 160    . clopidogrel (PLAVIX) 75 MG tablet Take 75 mg by mouth daily.     . furosemide (LASIX) 80 MG tablet Take 80 mg by mouth daily.     Marland Kitchen HUMALOG KWIKPEN 100 UNIT/ML KiwkPen Inject 22-34 Units into the skin 2 (two) times daily. Sliding scale as follows: 80-199 = 22 units 200-299 = 26 units 300-399 = 30 units >400 = 34 units  3  . hyaluronate sodium (RADIAPLEXRX) GEL Apply 1 application topically once. Reported on 10/31/2015    . lisinopril (PRINIVIL,ZESTRIL)  20 MG tablet Take 1 tablet (20 mg total) by mouth daily. 30 tablet 0  . metoCLOPramide (REGLAN) 5 MG tablet Take 2.5 mg by mouth 4 (four) times daily as needed for nausea or vomiting. Reported on 10/31/2015    . omeprazole (PRILOSEC) 20 MG capsule Take 20 mg by mouth daily. Reported on 10/31/2015    . pantoprazole (PROTONIX) 40 MG tablet Take 40 mg by mouth 2 (two) times daily.    . potassium chloride (KLOR-CON) 10 MEQ CR tablet Take 10 mEq by mouth daily.     . rosuvastatin (CRESTOR) 5 MG tablet Take 1 tablet (5 mg total) by mouth daily. 30 tablet 0  . TOUJEO SOLOSTAR 300 UNIT/ML SOPN Inject 30 Units into the skin every morning.   11  . ondansetron (ZOFRAN) 4 MG tablet Take 4 mg by mouth every 6 (six) hours as needed for nausea or vomiting. Reported on 10/31/2015    . promethazine (PHENERGAN) 25 MG tablet Take 25 mg by mouth every 6 (six) hours as needed for nausea or vomiting. Reported on 10/31/2015     No current facility-administered medications for this encounter.     Physical Findings: The patient is in no acute distress. Patient is alert and oriented.  height is 5\' 6"  (1.676 m) and weight is 155 lb (70.3 kg). Her oral temperature is 97.6 F (36.4  C). Her blood pressure is 169/48 (abnormal) and her pulse is 61. Her respiration is 16 and oxygen saturation is 100%.  She presents to the clinic in a wheelchair. Lungs are clear to auscultation bilaterally. Heart has regular rate and rhythm. No palpable cervical, supraclavicular, or axillary adenopathy. Right breast area shows hyperpigmentation changes and mild edema, no palpable sign of recurrence, the skin is well healed.  Lab Findings: Lab Results  Component Value Date   WBC 5.3 09/16/2015   HGB 11.2 (A) 09/16/2015   HCT 36 09/16/2015   MCV 82.1 09/09/2015   PLT 268 09/16/2015    Radiographic Findings: No results found.  Impression:  The patient is recovering from the effects of radiation.  Plan: Survivorship is scheduled on 01/12/16 and  she is scheduled to follow up with Dr. Lindi Adie the same day. She will follow up with radiation oncology in 6 months.  ____________________________________ -----------------------------------  Blair Promise, PhD, MD  This document serves as a record of services personally performed by Gery Pray, MD. It was created on his behalf by Darcus Austin, a trained medical scribe. The creation of this record is based on the scribe's personal observations and the provider's statements to them. This document has been checked and approved by the attending provider.

## 2015-11-28 NOTE — Therapy (Signed)
[facility] [address] [place], [place], [address] Phone: [phone]   Fax:  [phone]  Physical Therapy Treatment  Patient Details  Name: Glenda [name] MRN: [id number] Date of Birth: 07-22-1933 Referring Provider: Donnie Coffin, MD  Encounter Date: 11/27/2015      PT End of Session - 11/27/15 1630    Visit Number 9   Number of Visits 17   Date for PT Re-Evaluation 12/30/15   Authorization Type UHC MCR- G code needed every 10th visit   PT Start Time 1411  pt was late today   PT Stop Time 1445   PT Time Calculation (min) 34 min   Equipment Utilized During Treatment Gait belt   Activity Tolerance Patient tolerated treatment well;Patient limited by fatigue   Behavior During Therapy [facility] for tasks assessed/performed      Past Medical History:  Diagnosis Date  . Arthritis   . Breast cancer ([place])   . CAD (coronary artery disease)   . Chest pain   . Dizziness   . DM (diabetes mellitus) ([place])   . GERD (gastroesophageal reflux disease)   . HLD (hyperlipidemia)   . HTN (hypertension)   . Obesity   . Radiation 09/11/15-10/13/15   42.72 Gy to right breast, 12 Gy boost to right breast  . Shortness of breath dyspnea   . Stroke ([place])   . UTI (urinary tract infection)     Past Surgical History:  Procedure Laterality Date  . CHOLECYSTECTOMY    . colon polyps; hx    . CORONARY ANGIOPLASTY  02/14/09  . ESOPHAGOGASTRODUODENOSCOPY (EGD) WITH PROPOFOL Left 09/08/2015   Procedure: ESOPHAGOGASTRODUODENOSCOPY (EGD) WITH PROPOFOL;  Surgeon: Clarene Essex, MD;  [facility] ENDOSCOPY;  Service: Endoscopy;  Laterality: Left;  . RADIOACTIVE SEED GUIDED MASTECTOMY WITH AXILLARY SENTINEL LYMPH NODE BIOPSY Right 07/17/2015   Procedure: RADIOACTIVE SEED GUIDED PARTIAL MASTECTOMY WITH AXILLARY SENTINEL LYMPH NODE BIOPSY;  Surgeon: Erroll Luna, MD;  Location: [facility];  Service: General;  Laterality: Right;    There were no vitals  filed for this visit.      Subjective Assessment - 11/27/15 1418    Subjective No new complaints. No falls or pain to report. Walked from house to car and from car into PT clinic.   Patient is accompained by: --  [name]   Limitations Walking;House hold activities   Patient Stated Goals "I want to work on walking"    Currently in Pain? No/denies   Pain Score 0-No pain            [facility] PT Assessment - 11/27/15 1421      Berg Balance Test   Sit to Stand Able to stand using hands after several tries   Standing Unsupported Able to stand 2 minutes with supervision   Sitting with Back Unsupported but Feet Supported on Floor or Stool Able to sit safely and securely 2 minutes   Stand to Sit Sits independently, has uncontrolled descent   Transfers Needs one person to assist  supervison mat>chair, min assist to prevent fall chair>mat   Standing Unsupported with Eyes Closed Able to stand 10 seconds with supervision   Standing Ubsupported with Feet Together Needs help to attain position and unable to hold for 15 seconds  used walker for position, held 15 sec's   From Standing, Reach Forward with Outstretched Arm Reaches forward but needs supervision   From Standing Position, Pick up Object from Floor Unable to try/needs assist to keep balance  From Standing Position, Turn to Look Behind Over each Shoulder Turn sideways only but maintains balance   Turn 360 Degrees Needs assistance while turning   Standing Unsupported, Alternately Place Feet on Step/Stool Needs assistance to keep from falling or unable to try   Standing Unsupported, One Foot in Front Loses balance while stepping or standing   Standing on One Leg Unable to try or needs assist to prevent fall   Total Score 17   Berg comment: 17/56= high fall risk (was 12/56 on evaluation)            PT Education - 11/27/15 1630    Education provided Yes   Education Details HEP; reissued HEP to pt and daughter with her today along  with advancements to the exercises   Person(s) Educated Patient;Child(ren)   Methods Explanation;Handout;Verbal cues   Comprehension Verbalized understanding;Verbal cues required;Need further instruction          PT Short Term Goals - 11/27/15 1420      PT SHORT TERM GOAL #1   Title Pt will initiate HEP for strengthening and balance in order to improve functional mobility.  (Target Date: 11/28/15)   Status On-going     PT SHORT TERM GOAL #2   Title Pt will improve TUG score to <80 seconds in order to indicate decreased fall risk.     Baseline 11/25/15: 52.44 sec's with RW and simulated toe cap    Status Achieved     PT SHORT TERM GOAL #3   Title Will assess BERG and improve score by 4 points in order to indicate decreased fall risk and functional improvement in balance.     Baseline 11/27/15: 17/56 today (was 12/56 at baseline)   Status Achieved     PT SHORT TERM GOAL #4   Title Pt will ambulate over indoor surfaces x 150' w/ RW at S level in order to indicate safe negotiation in home.     Baseline 11/25/15: pt able to ambulate this distance with varied amout of assistance (min guard to min assist)   Status Partially Met     PT SHORT TERM GOAL #5   Title Pt will verbalize fall prevention strategies in order to indicate decreased fall risk inside and outside of home.     Status On-going     PT SHORT TERM GOAL #6   Title Pt will perform 8/10 sit <>stands with single UE support only at mod I level in order to indicate improved functional strength.     Status On-going           PT Long Term Goals - 11/04/15 2033      PT LONG TERM GOAL #1   Title Pt will be independent with HEP in order to indicate improved functional mobility and decreased fall risk.  (Target Date: 12/26/15)   Status New     PT LONG TERM GOAL #2   Title Pt will improve TUG score to <40 secs in order to indicate decreased fall risk.     Status New     PT LONG TERM GOAL #3   Title Pt will improve BERG balance  score 8 points from baseline in order to indicate decreased fall risk.     Baseline 11/04/15: baseline Berg balance score is 12/56   Status New     PT LONG TERM GOAL #4   Title Pt will perform 8/10 sit<>stand without UE support at mod I level in order to indicate improved functional strength.  Status New     PT LONG TERM GOAL #5   Title Pt will ambulate 300' w/ LRAD over paved outdoor surfaces at mod I level in order to indicate safe return to community.       PT LONG TERM GOAL #6   Title Pt will negotiate up/down 5 steps w/ single rail at mod I level in order to indicate safe entry into home/family's homes.     Status New               Plan - 11/27/15 1419    Clinical Impression Statement Today's session continued to address progress toward STGs . Pt did meet her goal for [facility] Test and parially met the HEP goal. No time to check remaining goals due pt being late for appt.   Rehab Potential Excellent   PT Frequency 2x / week   PT Duration 8 weeks   PT Treatment/Interventions ADLs/Self Care Home Management;Electrical Stimulation;DME Instruction;Gait training;Stair training;Functional mobility training;Therapeutic activities;Therapeutic exercise;Balance training;Neuromuscular re-education;Patient/family education;Orthotic Fit/Training;Vestibular;Visual/perceptual remediation/compensation   PT Next Visit Plan check remaining STGs; add single leg stance, tandem stance and feet together with EO/EC in corner to HEP when appropriate; work on gait with walker, endurance   Consulted and Agree with Plan of Care Patient;Family member/caregiver   Family Member Consulted daughter [name]      Patient will benefit from skilled therapeutic intervention in order to improve the following deficits and impairments:  Abnormal gait, Decreased activity tolerance, Decreased balance, Decreased coordination, Decreased endurance, Decreased knowledge of use of DME, Decreased mobility, Decreased  strength, Dizziness, Increased edema, Impaired perceived functional ability, Impaired flexibility, Impaired UE functional use, Impaired vision/preception, Improper body mechanics, Postural dysfunction  Visit Diagnosis: Muscle weakness (generalized)  Other lack of coordination  Unsteadiness on feet  Other abnormalities of gait and mobility     Problem List Patient Active Problem List   Diagnosis Date Noted  . Cerebral thrombosis with cerebral infarction 09/07/2015  . Lacunar infarct, acute ([place])   . Nausea & vomiting 09/05/2015  . Acute kidney injury ([place]) 09/04/2015  . Type II diabetes mellitus with neurological manifestations, uncontrolled ([place]) 09/03/2015  . Bilateral lower extremity edema 09/03/2015  . Essential hypertension   . Diabetes mellitus type 2 in nonobese ([facility])   . History of breast cancer   . AKI (acute kidney injury) ([place])   . Acute blood loss anemia   . Hemiparesis ([place])   . Gait disturbance, post-stroke   . Dysphagia, post-stroke   . Acute ischemic stroke/right cerebellar 08/24/2015  . Breast cancer of upper-outer quadrant of right female breast ([place]) 04/30/2015  . CVA (cerebral infarction) 07/05/2014  . Hyperlipidemia 07/05/2014  . Syncope 07/04/2014  . Mild mitral regurgitation by prior echocardiogram 12/20/2011  . Diabetes mellitus type 1, uncontrolled, insulin dependent ([place]) 02/26/2009  . OBESITY 02/26/2009  . Uncontrolled hypertension 02/26/2009  . CAD (coronary artery disease) 02/26/2009  . GASTROESOPHAGEAL REFLUX DISEASE 02/26/2009   Willow Ora, PTA, [place] [address], [place], [place] [address] [phone] 11/28/15, 1:12 PM   Name: Glenda [name] MRN: [id number] Date of Birth: 08/31/1933

## 2015-12-02 ENCOUNTER — Ambulatory Visit: Payer: Medicare Other | Admitting: Occupational Therapy

## 2015-12-02 ENCOUNTER — Encounter: Payer: Self-pay | Admitting: Physical Therapy

## 2015-12-02 ENCOUNTER — Ambulatory Visit: Payer: Medicare Other | Admitting: Physical Therapy

## 2015-12-02 VITALS — BP 192/76 | HR 65

## 2015-12-02 DIAGNOSIS — R278 Other lack of coordination: Secondary | ICD-10-CM

## 2015-12-02 DIAGNOSIS — R2681 Unsteadiness on feet: Secondary | ICD-10-CM

## 2015-12-02 DIAGNOSIS — M6281 Muscle weakness (generalized): Secondary | ICD-10-CM

## 2015-12-02 DIAGNOSIS — R2689 Other abnormalities of gait and mobility: Secondary | ICD-10-CM

## 2015-12-02 NOTE — Therapy (Signed)
[facility] [address] [place], [place], [address] Phone: [phone]   Fax:  [phone]  Physical Therapy Treatment  Patient Details  Name: Glenda [name] MRN: [id number] Date of Birth: 06-11-1933 Referring Provider: Donnie Coffin, MD  Encounter Date: 12/02/2015      PT End of Session - 12/02/15 1314    Visit Number 9  visit cxl'd today, no charge, still #9   Number of Visits 17   Date for PT Re-Evaluation 12/30/15   Authorization Type UHC MCR- G code needed every 10th visit   PT Start Time 1232   PT Stop Time 1300  no charges for today, see note   PT Time Calculation (min) 28 min   Equipment Utilized During Treatment Gait belt   Activity Tolerance Patient tolerated treatment well;Patient limited by fatigue   Behavior During Therapy [facility] for tasks assessed/performed      Past Medical History:  Diagnosis Date  . Arthritis   . Breast cancer ([place])   . CAD (coronary artery disease)   . Chest pain   . Dizziness   . DM (diabetes mellitus) ([place])   . GERD (gastroesophageal reflux disease)   . HLD (hyperlipidemia)   . HTN (hypertension)   . Obesity   . Radiation 09/11/15-10/13/15   42.72 Gy to right breast, 12 Gy boost to right breast  . Shortness of breath dyspnea   . Stroke ([place])   . UTI (urinary tract infection)     Past Surgical History:  Procedure Laterality Date  . CHOLECYSTECTOMY    . colon polyps; hx    . CORONARY ANGIOPLASTY  02/14/09  . ESOPHAGOGASTRODUODENOSCOPY (EGD) WITH PROPOFOL Left 09/08/2015   Procedure: ESOPHAGOGASTRODUODENOSCOPY (EGD) WITH PROPOFOL;  Surgeon: Clarene Essex, MD;  Location: [facility] ENDOSCOPY;  Service: Endoscopy;  Laterality: Left;  . RADIOACTIVE SEED GUIDED MASTECTOMY WITH AXILLARY SENTINEL LYMPH NODE BIOPSY Right 07/17/2015   Procedure: RADIOACTIVE SEED GUIDED PARTIAL MASTECTOMY WITH AXILLARY SENTINEL LYMPH NODE BIOPSY;  Surgeon: Erroll Luna, MD;  Location: [facility];  Service:  General;  Laterality: Right;    Vitals:   12/02/15 1240 12/02/15 1243  BP: (!) 190/75 (!) 192/76  Pulse: 65         Subjective Assessment - 12/02/15 1236    Subjective Pt reports she has not been feeling well past few days, daughter Glenda [name] confirms this. Pt reports she has had a headache that "comes and goes". Daughter also reports that her brother called her on Friday to report that the pt's speech was more slurred. Has continued to be since Friday and still is. Did not take her BP  medication today as she takes it at noon and she had not eaten and had to leave for therapy. Glenda [name] unable to confirm this as she was not the one with her this am, her sister was.    Limitations Walking;House hold activities   Patient Stated Goals "I want to work on walking"      see clinical impression statement. Pt arrived, visit cancelled with no charge due to medical issues.            PT Short Term Goals - 11/27/15 1420      PT SHORT TERM GOAL #1   Title Pt will initiate HEP for strengthening and balance in order to improve functional mobility.  (Target Date: 11/28/15)   Status On-going     PT SHORT TERM GOAL #2   Title Pt will improve TUG score to <80 seconds  in order to indicate decreased fall risk.     Baseline 11/25/15: 52.44 sec's with RW and simulated toe cap    Status Achieved     PT SHORT TERM GOAL #3   Title Will assess BERG and improve score by 4 points in order to indicate decreased fall risk and functional improvement in balance.     Baseline 11/27/15: 17/56 today (was 12/56 at baseline)   Status Achieved     PT SHORT TERM GOAL #4   Title Pt will ambulate over indoor surfaces x 150' w/ RW at S level in order to indicate safe negotiation in home.     Baseline 11/25/15: pt able to ambulate this distance with varied amout of assistance (min guard to min assist)   Status Partially Met     PT SHORT TERM GOAL #5   Title Pt will verbalize fall prevention strategies in order to indicate  decreased fall risk inside and outside of home.     Status On-going     PT SHORT TERM GOAL #6   Title Pt will perform 8/10 sit <>stands with single UE support only at mod I level in order to indicate improved functional strength.     Status On-going           PT Long Term Goals - 11/04/15 2033      PT LONG TERM GOAL #1   Title Pt will be independent with HEP in order to indicate improved functional mobility and decreased fall risk.  (Target Date: 12/26/15)   Status New     PT LONG TERM GOAL #2   Title Pt will improve TUG score to <40 secs in order to indicate decreased fall risk.     Status New     PT LONG TERM GOAL #3   Title Pt will improve BERG balance score 8 points from baseline in order to indicate decreased fall risk.     Baseline 11/04/15: baseline Berg balance score is 12/56   Status New     PT LONG TERM GOAL #4   Title Pt will perform 8/10 sit<>stand without UE support at mod I level in order to indicate improved functional strength.     Status New     PT LONG TERM GOAL #5   Title Pt will ambulate 300' w/ LRAD over paved outdoor surfaces at mod I level in order to indicate safe return to community.       PT LONG TERM GOAL #6   Title Pt will negotiate up/down 5 steps w/ single rail at mod I level in order to indicate safe entry into home/family's homes.     Status New           Plan - 12/02/15 1317    Clinical Impression Statement Pt arrived with reports of new CVA like symptoms. BP was found to be elevated both with automated machine and with manual retake, however of note pt reports not taking her BP med's today. Discussed my recommendation for pt to see MD due to new symptoms with Glenda [name], PT, DPT who concurred. Call placed to pt's primary MD office, spoke with Amy, RN who agreed pt should be seen. She also felt pt should be seen sooner that the 4:15pm appt they had. Pt and daugter were referred to urgent care/ED to have pt assessed due to new CVA symptoms.  Rehab Potential Excellent   PT Frequency 2x / week   PT Duration 8 weeks   PT Treatment/Interventions ADLs/Self Care Home Management;Electrical Stimulation;DME Instruction;Gait training;Stair training;Functional mobility training;Therapeutic activities;Therapeutic exercise;Balance training;Neuromuscular re-education;Patient/family education;Orthotic Fit/Training;Vestibular;Visual/perceptual remediation/compensation   PT Next Visit Plan G-code due; check remaining STGs; add single leg stance, tandem stance and feet together with EO/EC in corner to HEP when appropriate; work on gait with walker, endurance   Consulted and Agree with Plan of Care Patient;Family member/caregiver   Family Member Consulted daughter Glenda [name]      Patient will benefit from skilled therapeutic intervention in order to improve the following deficits and impairments:  Abnormal gait, Decreased activity tolerance, Decreased balance, Decreased coordination, Decreased endurance, Decreased knowledge of use of DME, Decreased mobility, Decreased strength, Dizziness, Increased edema, Impaired perceived functional ability, Impaired flexibility, Impaired UE functional use, Impaired vision/preception, Improper body mechanics, Postural dysfunction  Visit Diagnosis: Muscle weakness (generalized)  Other lack of coordination  Unsteadiness on feet  Other abnormalities of gait and mobility     Problem List Patient Active Problem List   Diagnosis Date Noted  . Cerebral thrombosis with cerebral infarction 09/07/2015  . Lacunar infarct, acute ([place])   . Nausea & vomiting 09/05/2015  . Acute kidney injury ([place]) 09/04/2015  . Type II diabetes mellitus with neurological manifestations, uncontrolled ([place]) 09/03/2015  . Bilateral lower extremity edema 09/03/2015  . Essential hypertension   . Diabetes mellitus type 2 in nonobese ([facility])   . History of breast cancer   . AKI (acute kidney injury) ([place])   .  Acute blood loss anemia   . Hemiparesis ([place])   . Gait disturbance, post-stroke   . Dysphagia, post-stroke   . Acute ischemic stroke/right cerebellar 08/24/2015  . Breast cancer of upper-outer quadrant of right female breast ([place]) 04/30/2015  . CVA (cerebral infarction) 07/05/2014  . Hyperlipidemia 07/05/2014  . Syncope 07/04/2014  . Mild mitral regurgitation by prior echocardiogram 12/20/2011  . Diabetes mellitus type 1, uncontrolled, insulin dependent ([place]) 02/26/2009  . OBESITY 02/26/2009  . Uncontrolled hypertension 02/26/2009  . CAD (coronary artery disease) 02/26/2009  . GASTROESOPHAGEAL REFLUX DISEASE 02/26/2009   Willow Ora, PTA, [facility] [address], [place], [place] [address] [phone] 12/02/15, 1:20 PM   Name: Glenda [name] MRN: [id number] Date of Birth: Jun 25, 1933

## 2015-12-03 ENCOUNTER — Ambulatory Visit: Payer: Medicare Other | Admitting: *Deleted

## 2015-12-03 ENCOUNTER — Encounter: Payer: Self-pay | Admitting: Physical Therapy

## 2015-12-03 ENCOUNTER — Ambulatory Visit: Payer: Medicare Other | Admitting: Physical Therapy

## 2015-12-03 ENCOUNTER — Encounter: Payer: Self-pay | Admitting: *Deleted

## 2015-12-03 VITALS — BP 172/57 | HR 57

## 2015-12-03 VITALS — BP 185/68 | HR 59

## 2015-12-03 DIAGNOSIS — M6281 Muscle weakness (generalized): Secondary | ICD-10-CM

## 2015-12-03 DIAGNOSIS — R278 Other lack of coordination: Secondary | ICD-10-CM

## 2015-12-03 DIAGNOSIS — R2689 Other abnormalities of gait and mobility: Secondary | ICD-10-CM

## 2015-12-03 DIAGNOSIS — R2681 Unsteadiness on feet: Secondary | ICD-10-CM

## 2015-12-03 NOTE — Patient Instructions (Addendum)
Fall Prevention in the Home   Falls can cause injuries. They can happen to people of all ages. There are many things you can do to make your home safe and to help prevent falls.   WHAT CAN I DO ON THE OUTSIDE OF MY HOME?  · Regularly fix the edges of walkways and driveways and fix any cracks.  · Remove anything that might make you trip as you walk through a door, such as a raised step or threshold.  · Trim any bushes or trees on the path to your home.  · Use bright outdoor lighting.  · Clear any walking paths of anything that might make someone trip, such as rocks or tools.  · Regularly check to see if handrails are loose or broken. Make sure that both sides of any steps have handrails.  · Any raised decks and porches should have guardrails on the edges.  · Have any leaves, snow, or ice cleared regularly.  · Use sand or salt on walking paths during winter.  · Clean up any spills in your garage right away. This includes oil or grease spills.  WHAT CAN I DO IN THE BATHROOM?   · Use night lights.  · Install grab bars by the toilet and in the tub and shower. Do not use towel bars as grab bars.  · Use non-skid mats or decals in the tub or shower.  · If you need to sit down in the shower, use a plastic, non-slip stool.  · Keep the floor dry. Clean up any water that spills on the floor as soon as it happens.  · Remove soap buildup in the tub or shower regularly.  · Attach bath mats securely with double-sided non-slip rug tape.  · Do not have throw rugs and other things on the floor that can make you trip.  WHAT CAN I DO IN THE BEDROOM?  · Use night lights.  · Make sure that you have a light by your bed that is easy to reach.  · Do not use any sheets or blankets that are too big for your bed. They should not hang down onto the floor.  · Have a firm chair that has side arms. You can use this for support while you get dressed.  · Do not have throw rugs and other things on the floor that can make you trip.  WHAT CAN I DO IN  THE KITCHEN?  · Clean up any spills right away.  · Avoid walking on wet floors.  · Keep items that you use a lot in easy-to-reach places.  · If you need to reach something above you, use a strong step stool that has a grab bar.  · Keep electrical cords out of the way.  · Do not use floor polish or wax that makes floors slippery. If you must use wax, use non-skid floor wax.  · Do not have throw rugs and other things on the floor that can make you trip.  WHAT CAN I DO WITH MY STAIRS?  · Do not leave any items on the stairs.  · Make sure that there are handrails on both sides of the stairs and use them. Fix handrails that are broken or loose. Make sure that handrails are as long as the stairways.  · Check any carpeting to make sure that it is firmly attached to the stairs. Fix any carpet that is loose or worn.  · Avoid having throw rugs at the top   or bottom of the stairs. If you do have throw rugs, attach them to the floor with carpet tape.  · Make sure that you have a light switch at the top of the stairs and the bottom of the stairs. If you do not have them, ask someone to add them for you.  WHAT ELSE CAN I DO TO HELP PREVENT FALLS?  · Wear shoes that:    Do not have high heels.    Have rubber bottoms.    Are comfortable and fit you well.    Are closed at the toe. Do not wear sandals.  · If you use a stepladder:    Make sure that it is fully opened. Do not climb a closed stepladder.    Make sure that both sides of the stepladder are locked into place.    Ask someone to hold it for you, if possible.  · Clearly mark and make sure that you can see:    Any grab bars or handrails.    First and last steps.    Where the edge of each step is.  · Use tools that help you move around (mobility aids) if they are needed. These include:    Canes.    Walkers.    Scooters.    Crutches.  · Turn on the lights when you go into a dark area. Replace any light bulbs as soon as they burn out.  · Set up your furniture so you have a clear  path. Avoid moving your furniture around.  · If any of your floors are uneven, fix them.  · If there are any pets around you, be aware of where they are.  · Review your medicines with your doctor. Some medicines can make you feel dizzy. This can increase your chance of falling.  Ask your doctor what other things that you can do to help prevent falls.     This information is not intended to replace advice given to you by your health care provider. Make sure you discuss any questions you have with your health care provider.     Document Released: 02/06/2009 Document Revised: 08/27/2014 Document Reviewed: 05/17/2014  Elsevier Interactive Patient Education ©2016 Elsevier Inc[employer]

## 2015-12-03 NOTE — Therapy (Signed)
[facility] [address] [place], [place], [address] Phone: [phone]   Fax:  [phone]  Patient Details  Name: Glenda [name] MRN: [id number] Date of Birth: 03-29-1934 Referring Provider:  Alroy Dust, L.Marlou Sa, MD  Encounter Date: 12/02/2015 Pt was sent to ED by PT with CVA symptoms. Arrived no charge for OT.  Nazly Digilio 12/03/2015, 8:30 AM  [facility] [address] [place], [place], [address] Phone: [phone]   Fax:  [phone]

## 2015-12-03 NOTE — Therapy (Signed)
[facility] [address] [place], [place], [address] Phone: [phone]   Fax:  [phone]  Occupational Therapy Treatment  Patient Details  Name: Glenda [name] MRN: [id number] Date of Birth: Sep 19, 1933 Referring Provider: Dr. Donnie Coffin  Encounter Date: 12/03/2015      OT End of Session - 12/03/15 1211    Visit Number 8   Number of Visits 17   Date for OT Re-Evaluation 01/04/16   Authorization Type UHC MCR   Authorization - Visit Number 8   Authorization - Number of Visits 10   OT Start Time 1020   OT Stop Time 1100   OT Time Calculation (min) 40 min   Activity Tolerance Patient tolerated treatment well   Behavior During Therapy [facility] for tasks assessed/performed      Past Medical History:  Diagnosis Date  . Arthritis   . Breast cancer ([place])   . CAD (coronary artery disease)   . Chest pain   . Dizziness   . DM (diabetes mellitus) ([place])   . GERD (gastroesophageal reflux disease)   . HLD (hyperlipidemia)   . HTN (hypertension)   . Obesity   . Radiation 09/11/15-10/13/15   42.72 Gy to right breast, 12 Gy boost to right breast  . Shortness of breath dyspnea   . Stroke ([place])   . UTI (urinary tract infection)     Past Surgical History:  Procedure Laterality Date  . CHOLECYSTECTOMY    . colon polyps; hx    . CORONARY ANGIOPLASTY  02/14/09  . ESOPHAGOGASTRODUODENOSCOPY (EGD) WITH PROPOFOL Left 09/08/2015   Procedure: ESOPHAGOGASTRODUODENOSCOPY (EGD) WITH PROPOFOL;  Surgeon: Clarene Essex, MD;  Location: [facility] ENDOSCOPY;  Service: Endoscopy;  Laterality: Left;  . RADIOACTIVE SEED GUIDED MASTECTOMY WITH AXILLARY SENTINEL LYMPH NODE BIOPSY Right 07/17/2015   Procedure: RADIOACTIVE SEED GUIDED PARTIAL MASTECTOMY WITH AXILLARY SENTINEL LYMPH NODE BIOPSY;  Surgeon: Erroll Luna, MD;  Location: [facility];  Service: General;  Laterality: Right;    Vitals:   12/03/15 1023  BP: (!) 172/57  Pulse: (!) 57         Subjective Assessment - 12/03/15 1023    Subjective  Pt states that she went to Doctor yesterday afternoon and per pt/family "It looks like I may have had a pin stroke" she did not recommend pt go to ER and will f/u with her on Friday 12/05/15. She is to monitor her blood pressure at home. She reports that she was 161/72 this morning.   Pertinent History CVA 08/24/15   Patient Stated Goals To do everything I did before   Currently in Pain? No/denies   Pain Score 0-No pain            [facility] OT Assessment - 12/03/15 0001      Hand Function   Right Hand Grip (lbs) 36   Left Hand Grip (lbs) 45                  OT Treatments/Exercises (OP) - 12/03/15 0001      ADLs   ADL Education Given Yes   General Comments Signs and symtpoms of CVA reviewed w/ Pt/daughter in clinic today; monitor BP and f/u w/ MD as planned on 12/05/15. Go to ER if symptoms change. Pt/daughter verbalized understandig in clinic today.     Hand Exercises   Other Hand Exercises Discussed using bilateral hands during ADL's at home: buttoning buttons, picking up coins, wrtiting and signing name. Holding utensil in right hand. Stacking  cards and in-hand manipulation with coins.    Other Hand Exercises 9 Hole peg test RUE: 73min 27 secs: LUE 40 secs   Decreased coordination noted R hand     Fine Motor Coordination   Fine Motor Coordination Flipping cards;Dealing card with thumb   Flipping cards RUE flipping cards with thumb and fingers - min-moderate difficulty noted. Increased time for tasks.                OT Education - 12/03/15 1210    Education provided Yes   Education Details Warning signs and symtpoms of CVA; ADL's and increased use RUE for coord and daily activities. Monitor BP and f/u in ER with any concerns or changes in symptoms.   Person(s) Educated Patient;Child(ren)   Methods Explanation;Demonstration;Verbal cues   Comprehension Verbalized understanding;Returned demonstration;Verbal cues  required;Need further instruction          OT Short Term Goals - 12/03/15 1027      OT SHORT TERM GOAL #3   Title Pt will demonstrate improved fine motor coordination as evidenced by decreasing 9 hole peg test score by 4 secs bilaterally.   Baseline RUE 1 min 27secs; LUE 40 secs   Time 4   Period Weeks   Status On-going  Note increased time on RUE after change in symptoms during visit w/ PT; per family, MD did not recommend ER visit           OT Long Term Goals - 11/06/15 1247      OT LONG TERM GOAL #1   Title I with updated HEP.   Time 8   Period Weeks   Status New     OT LONG TERM GOAL #2   Title Pt will perform simple cooking task in standing with supervision demonstrating good safety awareness.   Time 8   Period Weeks   Status New     OT LONG TERM GOAL #3   Title Pt will demonstrate ability to retrieve a light weight object at 120 shoulder flexion with RUE   Time 8   Period Weeks   Status New     OT LONG TERM GOAL #4   Title Pt will resume use of RUE as dominant hand at least 75% of the time for ADLs/IADLs.   Time 8   Period Weeks   Status New     OT LONG TERM GOAL #5   Title Pt will decrease RUE 9 hole peg test score to 49 secs from for increased fine motor coordination.   Baseline RUE 59 secs   Time 8     Long Term Additional Goals   Additional Long Term Goals Yes     OT LONG TERM GOAL #6   Title Pt will be modified indpendent with all basic ADLs.   Time 8   Period Weeks   Status New               Plan - 12/03/15 1212    Clinical Impression Statement Pt was and daughter report that pt f/u with her MD yesterday following CVA symptoms of slurred speech, decreased use of RUE etc. Per pt/family, pt states that MD states she may have had pin stroke and will f/u again with her on 12/05/15. Pt/family were educated in signs and symptoms of CVA and when to go to ER for immeadiate assessment. They verbalised understanding of this in clinic today. Pt  reported BP 161/72 at home this morning and was noted 172/57 in  clinic after coordination exercises. Pt states "That';s about correct" or normal for her. Pt demonstrates decreased coordination and should benefit from cont out-pt OT to assist with this.    Rehab Potential Good   OT Frequency 2x / week   OT Duration 8 weeks   OT Treatment/Interventions Self-care/ADL training;Therapeutic exercise;Patient/family education;Functional Mobility Training;Neuromuscular education;Manual Therapy;Therapeutic exercises;DME and/or AE instruction;Therapeutic activities;Electrical Stimulation;Fluidtherapy;Cognitive remediation/compensation;Moist Heat;Passive range of motion;Visual/perceptual remediation/compensation   Plan Monitor BP & pt symptoms; coordination and functional use RUE, work on LTG's.   Consulted and Agree with Plan of Care Patient;Family member/caregiver   Family Member Consulted daughter      Patient will benefit from skilled therapeutic intervention in order to improve the following deficits and impairments:  Abnormal gait, Decreased coordination, Decreased range of motion, Difficulty walking, Decreased safety awareness, Decreased endurance, Decreased activity tolerance, Decreased knowledge of precautions, Impaired UE functional use, Decreased knowledge of use of DME, Decreased balance, Decreased cognition, Decreased mobility, Decreased strength, Impaired perceived functional ability  Visit Diagnosis: Muscle weakness (generalized)  Other lack of coordination    Problem List Patient Active Problem List   Diagnosis Date Noted  . Cerebral thrombosis with cerebral infarction 09/07/2015  . Lacunar infarct, acute ([place])   . Nausea & vomiting 09/05/2015  . Acute kidney injury ([place]) 09/04/2015  . Type II diabetes mellitus with neurological manifestations, uncontrolled ([place]) 09/03/2015  . Bilateral lower extremity edema 09/03/2015  . Essential hypertension   . Diabetes mellitus type 2 in  nonobese ([facility])   . History of breast cancer   . AKI (acute kidney injury) ([place])   . Acute blood loss anemia   . Hemiparesis ([place])   . Gait disturbance, post-stroke   . Dysphagia, post-stroke   . Acute ischemic stroke/right cerebellar 08/24/2015  . Breast cancer of upper-outer quadrant of right female breast ([place]) 04/30/2015  . CVA (cerebral infarction) 07/05/2014  . Hyperlipidemia 07/05/2014  . Syncope 07/04/2014  . Mild mitral regurgitation by prior echocardiogram 12/20/2011  . Diabetes mellitus type 1, uncontrolled, insulin dependent ([place]) 02/26/2009  . OBESITY 02/26/2009  . Uncontrolled hypertension 02/26/2009  . CAD (coronary artery disease) 02/26/2009  . GASTROESOPHAGEAL REFLUX DISEASE 02/26/2009    Percell Miller Ardath Sax, OTR/L 12/03/2015, 12:18 PM  [facility] [address] [place], [place], [address] Phone: [phone]   Fax:  [phone]  Name: Glenda [name] MRN: [id number] Date of Birth: 07-20-1933

## 2015-12-04 ENCOUNTER — Encounter: Payer: Self-pay | Admitting: Rehabilitation

## 2015-12-04 ENCOUNTER — Ambulatory Visit: Payer: Medicare Other | Admitting: Rehabilitation

## 2015-12-04 ENCOUNTER — Ambulatory Visit: Payer: Medicare Other | Admitting: Occupational Therapy

## 2015-12-04 VITALS — BP 165/52

## 2015-12-04 DIAGNOSIS — M6281 Muscle weakness (generalized): Secondary | ICD-10-CM | POA: Diagnosis not present

## 2015-12-04 DIAGNOSIS — R278 Other lack of coordination: Secondary | ICD-10-CM

## 2015-12-04 DIAGNOSIS — R2689 Other abnormalities of gait and mobility: Secondary | ICD-10-CM

## 2015-12-04 DIAGNOSIS — R2681 Unsteadiness on feet: Secondary | ICD-10-CM

## 2015-12-04 DIAGNOSIS — M25611 Stiffness of right shoulder, not elsewhere classified: Secondary | ICD-10-CM

## 2015-12-04 NOTE — Patient Instructions (Signed)
Dr. Alroy Dust,   Glenda [name] has been receiving PT/OT at our clinic. Since her possible new TIA/ CVA on 12/02/15, she demonstrates decreased RUE coordination,  ( initial eval-RUE 59 secs, LUE 42.40 secs 12/03/15-RUE 1 min 27 secs, LUE 40 secs). Per physical therapy she had a decline in her TUG(11/25/15-52.44secs, 12/03/15-57.88 secs) and she is requiring increased cueing with ambulation due to decreased midline orientation vs. right inattention. She demonstrates increased slurred speech and pt reports slower processing. She may benefit from a ST evaluation to address these new deficits, if you agree please order ST.  Thanks, [employer], OTR/L

## 2015-12-04 NOTE — Therapy (Signed)
[facility] [address] [place], [place], [address] Phone: [phone]   Fax:  [phone]  Physical Therapy Treatment  Patient Details  Name: Glenda [name] MRN: [id number] Date of Birth: [birth year] Referring Provider: Donnie Coffin, MD  Encounter Date: 12/04/2015      PT End of Session - 12/04/15 1807    Visit Number 11   Number of Visits 17   Date for PT Re-Evaluation 12/30/15   Authorization Type U[employer] MCR- G code needed every 10th visit   PT Start Time 1410  PT late from previous appt   PT Stop Time 1448   PT Time Calculation (min) 38 min   Equipment Utilized During Treatment Gait belt   Activity Tolerance Patient tolerated treatment well;Patient limited by fatigue   Behavior During Therapy [facility] for tasks assessed/performed      Past Medical History:  Diagnosis Date  . Arthritis   . Breast cancer ([place])   . CAD (coronary artery disease)   . Chest pain   . Dizziness   . DM (diabetes mellitus) ([place])   . GERD (gastroesophageal reflux disease)   . HLD (hyperlipidemia)   . HTN (hypertension)   . Obesity   . Radiation 09/11/15-10/13/15   42.72 Gy to right breast, 12 Gy boost to right breast  . Shortness of breath dyspnea   . Stroke ([place])   . UTI (urinary tract infection)     Past Surgical History:  Procedure Laterality Date  . CHOLECYSTECTOMY    . colon polyps; hx    . CORONARY ANGIOPLASTY  02/14/09  . ESOPHAGOGASTRODUODENOSCOPY (EGD) WITH PROPOFOL Left 09/08/2015   Procedure: ESOPHAGOGASTRODUODENOSCOPY (EGD) WITH PROPOFOL;  Surgeon: Clarene Essex, MD;  Location: [facility] ENDOSCOPY;  Service: Endoscopy;  Laterality: Left;  . RADIOACTIVE SEED GUIDED MASTECTOMY WITH AXILLARY SENTINEL LYMPH NODE BIOPSY Right 07/17/2015   Procedure: RADIOACTIVE SEED GUIDED PARTIAL MASTECTOMY WITH AXILLARY SENTINEL LYMPH NODE BIOPSY;  Surgeon: Erroll Luna, MD;  Location: [place];  Service: General;  Laterality: Right;    There were  no vitals filed for this visit.      Subjective Assessment - 12/04/15 1802    Subjective Reports feeling better today, has MD visit tomorrow with PCP.  No falls.     Patient is accompained by: Family member   Limitations Walking;House hold activities   Patient Stated Goals "I want to work on walking"    Currently in Pain? No/denies            Self Care:  Had extensive discussion with pt and daughter regarding CVA warning signs/symtpoms.  Pt unable to name any symptoms other than speech difficulties when asked by PT.  Stressed importance of seeking medical attention if any symptoms arise.  Also discussed having visit scheduled with neurologist in order to further assess new symptoms as this is their specialty.  OT typed and printed neurologic changes for pt and daughter to provide to MD tomorrow as well as request for SLP order.  Daughter scheduled neuro visit with [facility] for Monday during OT session.    NMR:  Worked on standing stepping forwards and backwards with RLE in order to increase awareness of R hip and knee flexion to carryover to improved gait.  Provided pt with target to step over as well as mirror for increased visual feedback.  Note that pt with heavy R lateral lean during session and increased difficulty correcting despite max verbal and visual cues.  Pt able to state that she is  leaning to the R, however had increased difficulty processing cues and sequencing during session.  Note that R LE demonstrated increased weakness during session, despite use of simulated toe cap.  Would like to trial AFO's in future session to assess if this may be more appropriate with toe cap vs toe cap alone.    Gait:  Performed 115' gait (performed 13' then remaining distance with seated rest break in between due to fatigue) with RW at min A level.  Max verbal cues for increased R hip flex, knee flex and ankle DF to improve foot clearance.  She was unable to carryover during session, therefore placed  simulated toe cap during second bout of gait.  Note improved clearance, however pt still "dragging" foot through.  Pt seemed to have increased R inattention during mobility.                      PT Education - 12/04/15 1806    Education provided Yes   Education Details Continued education on CVA signs and symptoms, seeking medical attention, seeking medical guidance from neurologist due to marked mobility and cognitive/speech changes.     Person(s) Educated Patient;Child(ren)   Methods Explanation   Comprehension Verbalized understanding          PT Short Term Goals - 12/03/15 1107      PT SHORT TERM GOAL #1   Title Pt will initiate HEP for strengthening and balance in order to improve functional mobility.  (Target Date: 11/28/15)   Baseline 12/03/15: met today after reinstruction with pictures to daughter/pt   Status Achieved     PT SHORT TERM GOAL #2   Title Pt will improve TUG score to <80 seconds in order to indicate decreased fall risk.     Baseline 11/25/15: 52.44 sec's with RW and simulated toe cap    Status Achieved     PT SHORT TERM GOAL #3   Title Will assess BERG and improve score by 4 points in order to indicate decreased fall risk and functional improvement in balance.     Baseline 11/27/15: 17/56 today (was 12/56 at baseline)   Status Achieved     PT SHORT TERM GOAL #4   Title Pt will ambulate over indoor surfaces x 150' w/ RW at S level in order to indicate safe negotiation in home.     Baseline 11/25/15: pt able to ambulate this distance with varied amout of assistance (min guard to min assist)   Status Partially Met     PT SHORT TERM GOAL #5   Title Pt will verbalize fall prevention strategies in order to indicate decreased fall risk inside and outside of home.     Baseline 12/03/15: fall prevention strategies issued today with review with pt and daughter   Status Partially Met     PT SHORT TERM GOAL #6   Title Pt will perform 8/10 sit <>stands with  single UE support only at mod I level in order to indicate improved functional strength.     Baseline 12/03/15: pt continues to need min>mod assist to power up and to control descent with sitting down using 1-2 UE's to assist.   Status Not Met           PT Long Term Goals - 11/04/15 2033      PT LONG TERM GOAL #1   Title Pt will be independent with HEP in order to indicate improved functional mobility and decreased fall risk.  (Target Date:  12/26/15)   Status New     PT LONG TERM GOAL #2   Title Pt will improve TUG score to <40 secs in order to indicate decreased fall risk.     Status New     PT LONG TERM GOAL #3   Title Pt will improve BERG balance score 8 points from baseline in order to indicate decreased fall risk.     Baseline 11/04/15: baseline Berg balance score is 12/56   Status New     PT LONG TERM GOAL #4   Title Pt will perform 8/10 sit<>stand without UE support at mod I level in order to indicate improved functional strength.     Status New     PT LONG TERM GOAL #5   Title Pt will ambulate 300' w/ LRAD over paved outdoor surfaces at mod I level in order to indicate safe return to community.       PT LONG TERM GOAL #6   Title Pt will negotiate up/down 5 steps w/ single rail at mod I level in order to indicate safe entry into home/family's homes.     Status New               Plan - 12/04/15 1809    Clinical Impression Statement Note BPs today WFL (161/61 prior to mobility and 176/73 following gait), however continue to note marked mobility changes, possible increased R inattention, delayed processing, increased speed difficulties, and difficulty following commands.  Following discussion with OT, recommended that pt follow up with neurologist as they specialize in CVA.  OT wrote and printed note for PCP visit tomorrow to request SLP order due to new speed and cognitive changes.  Pt and daughter verbalized understanding.   Also continue to educate on CVA warning  signs/symptoms and importance of seeking medical attention if any of these arrise.  Daughter made appt with Neurologist on Monday and has visit with PCP tomorrow.  Will continue to follow.     Rehab Potential Excellent   PT Frequency 2x / week   PT Duration 8 weeks   PT Treatment/Interventions ADLs/Self Care Home Management;Electrical Stimulation;DME Instruction;Gait training;Stair training;Functional mobility training;Therapeutic activities;Therapeutic exercise;Balance training;Neuromuscular re-education;Patient/family education;Orthotic Fit/Training;Vestibular;Visual/perceptual remediation/compensation   PT Next Visit Plan check to see if SLP order present, set up appt if there.  How did MD visits go? Trial AFO's for improved foot clearance-to make getting toe cap free of charge or is just toe cap needed?   Consulted and Agree with Plan of Care Patient;Family member/caregiver   Family Member Consulted daughter       Patient will benefit from skilled therapeutic intervention in order to improve the following deficits and impairments:  Abnormal gait, Decreased activity tolerance, Decreased balance, Decreased coordination, Decreased endurance, Decreased knowledge of use of DME, Decreased mobility, Decreased strength, Dizziness, Increased edema, Impaired perceived functional ability, Impaired flexibility, Impaired UE functional use, Impaired vision/preception, Improper body mechanics, Postural dysfunction  Visit Diagnosis: Muscle weakness (generalized)  Unsteadiness on feet  Other abnormalities of gait and mobility    Problem List Patient Active Problem List   Diagnosis Date Noted  . Cerebral thrombosis with cerebral infarction 09/07/2015  . Lacunar infarct, acute ([place])   . Nausea & vomiting 09/05/2015  . Acute kidney injury ([place]) 09/04/2015  . Type II diabetes mellitus with neurological manifestations, uncontrolled ([place]) 09/03/2015  . Bilateral lower extremity edema 09/03/2015  .  Essential hypertension   . Diabetes mellitus type 2 in nonobese ([facility])   . History of  breast cancer   . AKI (acute kidney injury) ([place])   . Acute blood loss anemia   . Hemiparesis ([place])   . Gait disturbance, post-stroke   . Dysphagia, post-stroke   . Acute ischemic stroke/right cerebellar 08/24/2015  . Breast cancer of upper-outer quadrant of right female breast ([place]) 04/30/2015  . CVA (cerebral infarction) 07/05/2014  . Hyperlipidemia 07/05/2014  . Syncope 07/04/2014  . Mild mitral regurgitation by prior echocardiogram 12/20/2011  . Diabetes mellitus type 1, uncontrolled, insulin dependent ([place]) 02/26/2009  . OBESITY 02/26/2009  . Uncontrolled hypertension 02/26/2009  . CAD (coronary artery disease) 02/26/2009  . GASTROESOPHAGEAL REFLUX DISEASE 02/26/2009    Cameron Sprang, PT, MPT [facility] [address] [place], [place], [address] Phone: [phone]   Fax:  [phone] 12/04/15, 6:17 PM  Name: Hailynn Slovacek MRN: [id number] Date of Birth: 04-Oct-1933

## 2015-12-04 NOTE — Therapy (Signed)
[facility] [address] [place], [place], [address] Phone: [phone]   Fax:  [phone]  Physical Therapy Treatment  Patient Details  Name: Glenda [name] MRN: [id number] Date of Birth: 08-20-1933 Referring Provider: Donnie Coffin, MD  Encounter Date: 12/03/2015      PT End of Session - 12/03/15 1106    Visit Number 10   Number of Visits 17   Date for PT Re-Evaluation 12/30/15   Authorization Type UHC MCR- G code needed every 10th visit   PT Start Time 1102   PT Stop Time 1145   PT Time Calculation (min) 43 min   Equipment Utilized During Treatment Gait belt   Activity Tolerance Patient tolerated treatment well;Patient limited by fatigue   Behavior During Therapy [facility] for tasks assessed/performed      Past Medical History:  Diagnosis Date  . Arthritis   . Breast cancer ([place])   . CAD (coronary artery disease)   . Chest pain   . Dizziness   . DM (diabetes mellitus) ([place])   . GERD (gastroesophageal reflux disease)   . HLD (hyperlipidemia)   . HTN (hypertension)   . Obesity   . Radiation 09/11/15-10/13/15   42.72 Gy to right breast, 12 Gy boost to right breast  . Shortness of breath dyspnea   . Stroke ([place])   . UTI (urinary tract infection)     Past Surgical History:  Procedure Laterality Date  . CHOLECYSTECTOMY    . colon polyps; hx    . CORONARY ANGIOPLASTY  02/14/09  . ESOPHAGOGASTRODUODENOSCOPY (EGD) WITH PROPOFOL Left 09/08/2015   Procedure: ESOPHAGOGASTRODUODENOSCOPY (EGD) WITH PROPOFOL;  Surgeon: Clarene Essex, MD;  Location: [facility] ENDOSCOPY;  Service: Endoscopy;  Laterality: Left;  . RADIOACTIVE SEED GUIDED MASTECTOMY WITH AXILLARY SENTINEL LYMPH NODE BIOPSY Right 07/17/2015   Procedure: RADIOACTIVE SEED GUIDED PARTIAL MASTECTOMY WITH AXILLARY SENTINEL LYMPH NODE BIOPSY;  Surgeon: Erroll Luna, MD;  Location: [place];  Service: General;  Laterality: Right;    Vitals:   12/03/15 1104 12/03/15 1124   BP: (!) 188/71 (!) 185/68  Pulse: (!) 59         Subjective Assessment - 12/03/15 1104    Subjective Saw Dr. Alroy Dust yesterday who feels she may have had a mini-stroke. BP's are better, however still high. She is recording them at home and has another follow up with Dr. Alroy Dust on Friday at 2pm. No falls or pain to report. Reports she is feeling better today, "I got a great night of sleep last night".                                                        Patient is accompained by: Family member  [name]   Limitations Walking;House hold activities   Patient Stated Goals "I want to work on walking"    Currently in Pain? No/denies   Pain Score 0-No pain              [facility] Adult PT Treatment/Exercise - 12/03/15 1107      Transfers   Transfers Sit to Stand;Stand to [employer] Transfers   Sit to Stand 4: Min guard;4: Min assist;With upper extremity assist;From chair/3-in-1   Stand to Sit 4: Min guard;4: Min assist;With upper extremity assist;To chair/3-in-1;Uncontrolled descent   Number of Reps 10 reps;1  set   Transfer Cueing cues for sequencing and technique, progressed to needing increased assistance, especially for controlled sitting as reps progressed. min guard to mod assist with 1 UE support.                              Ambulation/Gait   Ambulation/Gait Yes   Ambulation/Gait Assistance 4: Min guard;4: Min assist   Ambulation/Gait Assistance Details cues for posture, increased step/stride length and for increased right foot clearance with gait.  added simulated toe cap after 1st gait trail with improved right foot advancement. provided facilition to speed gait up by pushing walker and cueing pt to take bigger steps to keep up with walker with mild improvement noted, no carry over once PTA stopped facilitaiting walker speed. Pt does tend to "freeze" at times: mostly with turns or when approaching obstacles/congested area, needing cues to keep stepping with larger steps.                                 Ambulation Distance (Feet) 80 Feet  x2   Assistive device Rolling walker  simulated toe cap added after 1st gait trial   Gait Pattern Step-through pattern;Decreased stride length;Decreased step length - right;Decreased step length - left;Decreased stance time - right;Decreased hip/knee flexion - right;Decreased weight shift to right;Shuffle;Ataxic;Trunk flexed;Narrow base of support;Poor foot clearance - right   Ambulation Surface Level;Indoor     Timed Up and Go Test   TUG Normal TUG   Normal TUG (seconds) 57.88            PT Education - 12/03/15 1145    Education provided Yes   Education Details fall prevention strategies   Person(s) Educated Patient;Child(ren)   Methods Explanation;Demonstration;Handout   Comprehension Verbalized understanding;Returned demonstration;Verbal cues required;Need further instruction          PT Short Term Goals - 12/03/15 1107      PT SHORT TERM GOAL #1   Title Pt will initiate HEP for strengthening and balance in order to improve functional mobility.  (Target Date: 11/28/15)   Baseline 12/03/15: met today after reinstruction with pictures to daughter/pt   Status Achieved     PT SHORT TERM GOAL #2   Title Pt will improve TUG score to <80 seconds in order to indicate decreased fall risk.     Baseline 11/25/15: 52.44 sec's with RW and simulated toe cap    Status Achieved     PT SHORT TERM GOAL #3   Title Will assess BERG and improve score by 4 points in order to indicate decreased fall risk and functional improvement in balance.     Baseline 11/27/15: 17/56 today (was 12/56 at baseline)   Status Achieved     PT SHORT TERM GOAL #4   Title Pt will ambulate over indoor surfaces x 150' w/ RW at S level in order to indicate safe negotiation in home.     Baseline 11/25/15: pt able to ambulate this distance with varied amout of assistance (min guard to min assist)   Status Partially Met     PT SHORT TERM GOAL #5   Title Pt  will verbalize fall prevention strategies in order to indicate decreased fall risk inside and outside of home.     Baseline 12/03/15: fall prevention strategies issued today with review with pt and daughter   Status Partially Met  PT SHORT TERM GOAL #6   Title Pt will perform 8/10 sit <>stands with single UE support only at mod I level in order to indicate improved functional strength.     Baseline 12/03/15: pt continues to need min>mod assist to power up and to control descent with sitting down using 1-2 UE's to assist.   Status Not Met           PT Long Term Goals - 11/04/15 2033      PT LONG TERM GOAL #1   Title Pt will be independent with HEP in order to indicate improved functional mobility and decreased fall risk.  (Target Date: 12/26/15)   Status New     PT LONG TERM GOAL #2   Title Pt will improve TUG score to <40 secs in order to indicate decreased fall risk.     Status New     PT LONG TERM GOAL #3   Title Pt will improve BERG balance score 8 points from baseline in order to indicate decreased fall risk.     Baseline 11/04/15: baseline Berg balance score is 12/56   Status New     PT LONG TERM GOAL #4   Title Pt will perform 8/10 sit<>stand without UE support at mod I level in order to indicate improved functional strength.     Status New     PT LONG TERM GOAL #5   Title Pt will ambulate 300' w/ LRAD over paved outdoor surfaces at mod I level in order to indicate safe return to community.       PT LONG TERM GOAL #6   Title Pt will negotiate up/down 5 steps w/ single rail at mod I level in order to indicate safe entry into home/family's homes.     Status New            Plan - 12/03/15 1106    Clinical Impression Statement Pt's timed up and go was slightly slower today vs with previous session prior to most recent mini stroke, not a significant amount difference. Pt does continued to have right foot drag with gait placing her at risk of falling. Daughter reports pt is  walking at home sometimes . Reinforced need to use walker and have someone with her for safety at this time. Educated pt and daughter on fall prevention strategies for home. During discussion it came up that pt used towel rod in bathroom to pull on for balance. Instructed pt/daughter to stop doing this as it is not safe, the rod can/most likely will come out of the wall. Educated them to use properly installed grab bars only and most all [employer] stores will have these. Verbally reviewed CVA risk factors due to pt's recent medical issues and family not sure how to handle it (slurred speech, HA and elevated BP, did not call MD) so to have them better prepared in future if symptoms occur again. Pt has partially or fully met most all STGs. Progress has been slow due to fatigue and medical issues.                                               Rehab Potential Excellent   PT Frequency 2x / week   PT Duration 8 weeks   PT Treatment/Interventions ADLs/Self Care Home Management;Electrical Stimulation;DME Instruction;Gait training;Stair training;Functional mobility training;Therapeutic activities;Therapeutic exercise;Balance training;Neuromuscular re-education;Patient/family education;Orthotic  Fit/Training;Vestibular;Visual/perceptual remediation/compensation   PT Next Visit Plan Check BP;issue written signs and symptoms of CVA; add single leg stance, tandem stance and feet together with EO/EC in corner to HEP when appropriate; work on gait with walker, endurance   Consulted and Agree with Plan of Care Patient;Family member/caregiver   Family Member Consulted daughter [name]      Patient will benefit from skilled therapeutic intervention in order to improve the following deficits and impairments:  Abnormal gait, Decreased activity tolerance, Decreased balance, Decreased coordination, Decreased endurance, Decreased knowledge of use of DME, Decreased mobility, Decreased strength, Dizziness, Increased edema, Impaired  perceived functional ability, Impaired flexibility, Impaired UE functional use, Impaired vision/preception, Improper body mechanics, Postural dysfunction  Visit Diagnosis: Unsteadiness on feet  Other abnormalities of gait and mobility  Muscle weakness (generalized)       G-Codes - 12/30/2015 0959    Functional Assessment Tool Used TUG: 57.88 sec's, min guard assist for gait with TUG.  Pt has walked up to 115 feet with RW with up to min assist.   Functional Limitation Mobility: Walking and moving around   Mobility: Walking and Moving Around Current Status [phone]) At least 60 percent but less than 80 percent impaired, limited or restricted   Mobility: Walking and Moving Around Goal Status [phone]) At least 1 percent but less than 20 percent impaired, limited or restricted        Problem List Patient Active Problem List   Diagnosis Date Noted  . Cerebral thrombosis with cerebral infarction 09/07/2015  . Lacunar infarct, acute ([place])   . Nausea & vomiting 09/05/2015  . Acute kidney injury ([place]) 09/04/2015  . Type II diabetes mellitus with neurological manifestations, uncontrolled ([place]) 09/03/2015  . Bilateral lower extremity edema 09/03/2015  . Essential hypertension   . Diabetes mellitus type 2 in nonobese ([facility])   . History of breast cancer   . AKI (acute kidney injury) ([place])   . Acute blood loss anemia   . Hemiparesis ([place])   . Gait disturbance, post-stroke   . Dysphagia, post-stroke   . Acute ischemic stroke/right cerebellar 08/24/2015  . Breast cancer of upper-outer quadrant of right female breast ([name]) 04/30/2015  . CVA (cerebral infarction) 07/05/2014  . Hyperlipidemia 07/05/2014  . Syncope 07/04/2014  . Mild mitral regurgitation by prior echocardiogram 12/20/2011  . Diabetes mellitus type 1, uncontrolled, insulin dependent ([place]) 02/26/2009  . OBESITY 02/26/2009  . Uncontrolled hypertension 02/26/2009  . CAD (coronary artery disease) 02/26/2009  . GASTROESOPHAGEAL  REFLUX DISEASE 02/26/2009    Willow Ora, PTA, [facility] [address] [place], [place] [address] [phone] 12-30-15, 10:00 AM   Name: Glenda [name] MRN: [id number] Date of Birth: 1933-11-21   Physical Therapy Progress Note  Dates of Reporting Period: 10/31/15 to 2015-12-30  Objective Reports of Subjective Statement: See above  Objective Measurements: See GCode status  Goal Update: continue LTG's mentioned above  Plan: Continue POC.  Reason Skilled Services are Required: Pt with recent medical instability and new TIA, therefore note some mild decrease in physical ability, but overall still with decreased R sided strength, decreased balance, and decreased endurance.

## 2015-12-05 NOTE — Therapy (Signed)
[facility] [address] [place], [place], [address] Phone: [phone]   Fax:  [phone]  Occupational Therapy Treatment  Patient Details  Name: Glenda [name] MRN: [id number] Date of Birth: 1934-04-23 Referring Provider: Dr. Donnie Coffin  Encounter Date: 12/04/2015    Past Medical History:  Diagnosis Date  . Arthritis   . Breast cancer ([place])   . CAD (coronary artery disease)   . Chest pain   . Dizziness   . DM (diabetes mellitus) ([place])   . GERD (gastroesophageal reflux disease)   . HLD (hyperlipidemia)   . HTN (hypertension)   . Obesity   . Radiation 09/11/15-10/13/15   42.72 Gy to right breast, 12 Gy boost to right breast  . Shortness of breath dyspnea   . Stroke ([place])   . UTI (urinary tract infection)     Past Surgical History:  Procedure Laterality Date  . CHOLECYSTECTOMY    . colon polyps; hx    . CORONARY ANGIOPLASTY  02/14/09  . ESOPHAGOGASTRODUODENOSCOPY (EGD) WITH PROPOFOL Left 09/08/2015   Procedure: ESOPHAGOGASTRODUODENOSCOPY (EGD) WITH PROPOFOL;  Surgeon: Clarene Essex, MD;  Location: [facility] ENDOSCOPY;  Service: Endoscopy;  Laterality: Left;  . RADIOACTIVE SEED GUIDED MASTECTOMY WITH AXILLARY SENTINEL LYMPH NODE BIOPSY Right 07/17/2015   Procedure: RADIOACTIVE SEED GUIDED PARTIAL MASTECTOMY WITH AXILLARY SENTINEL LYMPH NODE BIOPSY;  Surgeon: Erroll Luna, MD;  Location: [facility];  Service: General;  Laterality: Right;    Vitals:   12/05/15 1712  BP: (!) 165/52            functional reaching with bilateral UE's in seated to place graded clothespins on vertical antennae, increased difficulty noted with RUE use,increased ataxia present. Arm bike x 6 mins level 1 for reciprocal movement. Education/ discussion with pt/ daughter regarding CVA warning signs and importance of seeking medical attention. Therapist wrote a note to pt's PCP as she sees him Friday. Vitals were monitored. See note in pt  instructions for changes since CVA.                  OT Education - 12/05/15 1712    Education provided Yes   Education Details CVA signs and importance of seeking medical attention if pt is symptomatic, note to PCP, importance of f/u with neurologist   Person(s) Educated Patient;Child(ren)   Methods Explanation   Comprehension Verbalized understanding          OT Short Term Goals - 12/03/15 1027      OT SHORT TERM GOAL #3   Title Pt will demonstrate improved fine motor coordination as evidenced by decreasing 9 hole peg test score by 4 secs bilaterally.   Baseline RUE 1 min 27secs; LUE 40 secs   Time 4   Period Weeks   Status On-going  Note increased time on RUE after change in symptoms during visit w/ PT; per family, MD did not recommend ER visit           OT Long Term Goals - 11/06/15 1247      OT LONG TERM GOAL #1   Title I with updated HEP.   Time 8   Period Weeks   Status New     OT LONG TERM GOAL #2   Title Pt will perform simple cooking task in standing with supervision demonstrating good safety awareness.   Time 8   Period Weeks   Status New     OT LONG TERM GOAL #3   Title Pt will demonstrate  ability to retrieve a light weight object at 120 shoulder flexion with RUE   Time 8   Period Weeks   Status New     OT LONG TERM GOAL #4   Title Pt will resume use of RUE as dominant hand at least 75% of the time for ADLs/IADLs.   Time 8   Period Weeks   Status New     OT LONG TERM GOAL #5   Title Pt will decrease RUE 9 hole peg test score to 49 secs from for increased fine motor coordination.   Baseline RUE 59 secs   Time 8     Long Term Additional Goals   Additional Long Term Goals Yes     OT LONG TERM GOAL #6   Title Pt will be modified indpendent with all basic ADLs.   Time 8   Period Weeks   Status New               Plan - 12/05/15 1707    Clinical Impression Statement Therapsit sent note to pt's PCP regarding changes in  coordination, mobility, and speech following possible TIA. Pt's scheduled an appt. with neurologist for next Monday after therapist's recommendation.   Rehab Potential Good   OT Frequency 2x / week   OT Duration 8 weeks   Plan continue to work towards goals, monitor BP.    Consulted and Agree with Plan of Care Patient;Family member/caregiver   Family Member Consulted daughter      Patient will benefit from skilled therapeutic intervention in order to improve the following deficits and impairments:  Abnormal gait, Decreased coordination, Decreased range of motion, Difficulty walking, Decreased safety awareness, Decreased endurance, Decreased activity tolerance, Decreased knowledge of precautions, Impaired UE functional use, Decreased knowledge of use of DME, Decreased balance, Decreased cognition, Decreased mobility, Decreased strength, Impaired perceived functional ability  Visit Diagnosis: Muscle weakness (generalized)  Unsteadiness on feet  Other abnormalities of gait and mobility  Other lack of coordination  Shoulder stiffness, right    Problem List Patient Active Problem List   Diagnosis Date Noted  . Cerebral thrombosis with cerebral infarction 09/07/2015  . Lacunar infarct, acute ([place])   . Nausea & vomiting 09/05/2015  . Acute kidney injury ([place]) 09/04/2015  . Type II diabetes mellitus with neurological manifestations, uncontrolled ([place]) 09/03/2015  . Bilateral lower extremity edema 09/03/2015  . Essential hypertension   . Diabetes mellitus type 2 in nonobese ([facility])   . History of breast cancer   . AKI (acute kidney injury) ([place])   . Acute blood loss anemia   . Hemiparesis ([place])   . Gait disturbance, post-stroke   . Dysphagia, post-stroke   . Acute ischemic stroke/right cerebellar 08/24/2015  . Breast cancer of upper-outer quadrant of right female breast ([place]) 04/30/2015  . CVA (cerebral infarction) 07/05/2014  . Hyperlipidemia 07/05/2014  . Syncope 07/04/2014  .  Mild mitral regurgitation by prior echocardiogram 12/20/2011  . Diabetes mellitus type 1, uncontrolled, insulin dependent ([place]) 02/26/2009  . OBESITY 02/26/2009  . Uncontrolled hypertension 02/26/2009  . CAD (coronary artery disease) 02/26/2009  . GASTROESOPHAGEAL REFLUX DISEASE 02/26/2009    RINE,KATHRYN 12/05/2015, 5:14 PM  [facility] [address] [place], [place], [address] Phone: [phone]   Fax:  [phone]  Name: Katlynn Penuelas MRN: [id number] Date of Birth: 09-Dec-1933

## 2015-12-08 ENCOUNTER — Ambulatory Visit (INDEPENDENT_AMBULATORY_CARE_PROVIDER_SITE_OTHER): Payer: Medicare Other | Admitting: Nurse Practitioner

## 2015-12-08 ENCOUNTER — Encounter: Payer: Self-pay | Admitting: Nurse Practitioner

## 2015-12-08 VITALS — BP 120/60 | HR 60 | Ht 66.0 in | Wt 160.0 lb

## 2015-12-08 DIAGNOSIS — G8190 Hemiplegia, unspecified affecting unspecified side: Secondary | ICD-10-CM

## 2015-12-08 DIAGNOSIS — I639 Cerebral infarction, unspecified: Secondary | ICD-10-CM | POA: Diagnosis not present

## 2015-12-08 DIAGNOSIS — E785 Hyperlipidemia, unspecified: Secondary | ICD-10-CM | POA: Diagnosis not present

## 2015-12-08 DIAGNOSIS — R269 Unspecified abnormalities of gait and mobility: Secondary | ICD-10-CM | POA: Diagnosis not present

## 2015-12-08 DIAGNOSIS — G4710 Hypersomnia, unspecified: Secondary | ICD-10-CM

## 2015-12-08 DIAGNOSIS — I10 Essential (primary) hypertension: Secondary | ICD-10-CM

## 2015-12-08 DIAGNOSIS — I69398 Other sequelae of cerebral infarction: Secondary | ICD-10-CM

## 2015-12-08 DIAGNOSIS — R400 Somnolence: Secondary | ICD-10-CM | POA: Insufficient documentation

## 2015-12-08 NOTE — Progress Notes (Signed)
[facility]  PATIENT: Glenda [name] DOB: 1933-05-30   REASON FOR VISIT: Hospital follow-up for stroke  HISTORY FROM: Patient ,daughters    HISTORY OF PRESENT ILLNESS:Glenda [name] is a 82 y.o. female recently admitted to [facility] from 08/24/2015 until 08/28/2015 for evaluation of dizziness, nausea, vomiting, and gait instability. An MRI on 08/24/2015 revealed a punctate acute infarct in the right middle cerebellar peduncle. The patient was seen in consultation by Dr. Leonel Ramsay on 08/24/2015 and subsequently followed by Dr. Leonie Man. The infarct was felt secondary to small vessel disease. The patient had been on aspirin 325 mg daily with Plavix 75 mg daily in the past; however, when seen by Dr. Leonie Man the aspirin was discontinued. She was however discharged to a nursing facility for short-term rehabilitation on 08/27/2005 on aspirin 81 mg daily with Plavix 75 mg daily.   The patient was readmitted 09/07/15  with nausea, vomiting, and dizziness. She was noted to have coffee-ground emesis. An MRI performed 09/05/2015 showed a mild acute extension of the lacunar type right cerebellar infarct. Neurology was asked to reconsult. MRA no high-grade stenosis. Echo ejection fraction 60-65% LDL 70 hemoglobin A1c 10.2 Recent coffee-ground emesis and antiplatelet was put on hold. She was discharged to a skilled nursing facility where she received some physical therapy. She is now back home and receiving outpatient rehabilitation physical therapy occupational therapy. Daughter lives with her. Patient uses a walker in the home and a wheelchair outside the home. No falls no further stroke or TIA symptoms. She does complain of daytime drowsiness and not feeling rested in the morning. She returns for reevaluation    REVIEW OF SYSTEMS: Full 14 system review of systems performed and notable only for those listed, all others are neg:  Constitutional: neg  Cardiovascular:  neg Ear/Nose/Throat: neg  Skin: neg Eyes: neg Respiratory: neg Gastroitestinal: neg  Hematology/Lymphatic: neg  Endocrine: neg Musculoskeletal: Gait abnormality Allergy/Immunology: neg Neurological: Weakness Psychiatric: neg Sleep : Daytime drowsiness   ALLERGIES: No Known Allergies  HOME MEDICATIONS: Outpatient Medications Prior to Visit  Medication Sig Dispense Refill  . amLODipine (NORVASC) 10 MG tablet Take 10 mg by mouth daily. Reported on 10/31/2015    . aspirin 81 MG chewable tablet Chew 81 mg by mouth daily.    . clonazePAM (KLONOPIN) 0.5 MG tablet Take 0.5 tablets (0.25 mg total) by mouth 3 (three) times daily as needed for anxiety. 30 tablet 0  . cloNIDine (CATAPRES) 0.2 MG tablet Take 0.2 mg by mouth 2 (two) times daily as needed. BP > 160    . clopidogrel (PLAVIX) 75 MG tablet Take 75 mg by mouth daily.     . furosemide (LASIX) 80 MG tablet Take 80 mg by mouth daily as needed.     Marland Kitchen HUMALOG KWIKPEN 100 UNIT/ML KiwkPen Inject 22-34 Units into the skin 2 (two) times daily. Sliding scale as follows: 80-199 = 22 units 200-299 = 26 units 300-399 = 30 units >400 = 34 units  3  . pantoprazole (PROTONIX) 40 MG tablet Take 40 mg by mouth 2 (two) times daily.    . potassium chloride (KLOR-CON) 10 MEQ CR tablet Take 10 mEq by mouth 2 (two) times daily. When taking lasix    . TOUJEO SOLOSTAR 300 UNIT/ML SOPN Inject 24 Units into the skin every morning.   11  . hyaluronate sodium (RADIAPLEXRX) GEL Apply 1 application topically once. Reported on 10/31/2015    . lisinopril (PRINIVIL,ZESTRIL) 20 MG tablet Take 1 tablet (20 mg  total) by mouth daily. (Patient not taking: Reported on 12/08/2015) 30 tablet 0  . metoCLOPramide (REGLAN) 5 MG tablet Take 2.5 mg by mouth 4 (four) times daily as needed for nausea or vomiting. Reported on 10/31/2015    . omeprazole (PRILOSEC) 20 MG capsule Take 20 mg by mouth daily. Reported on 10/31/2015    . ondansetron (ZOFRAN) 4 MG tablet Take 4 mg by mouth every  6 (six) hours as needed for nausea or vomiting. Reported on 10/31/2015    . promethazine (PHENERGAN) 25 MG tablet Take 25 mg by mouth every 6 (six) hours as needed for nausea or vomiting. Reported on 10/31/2015    . rosuvastatin (CRESTOR) 5 MG tablet Take 1 tablet (5 mg total) by mouth daily. (Patient not taking: Reported on 12/08/2015) 30 tablet 0   No facility-administered medications prior to visit.     PAST MEDICAL HISTORY: Past Medical History:  Diagnosis Date  . Arthritis   . Breast cancer ([place])   . CAD (coronary artery disease)   . Chest pain   . Dizziness   . DM (diabetes mellitus) ([place])   . GERD (gastroesophageal reflux disease)   . HLD (hyperlipidemia)   . HTN (hypertension)   . Obesity   . Radiation 09/11/15-10/13/15   42.72 Gy to right breast, 12 Gy boost to right breast  . Shortness of breath dyspnea   . Stroke ([place])   . UTI (urinary tract infection)     PAST SURGICAL HISTORY: Past Surgical History:  Procedure Laterality Date  . CHOLECYSTECTOMY    . colon polyps; hx    . CORONARY ANGIOPLASTY  02/14/09  . ESOPHAGOGASTRODUODENOSCOPY (EGD) WITH PROPOFOL Left 09/08/2015   Procedure: ESOPHAGOGASTRODUODENOSCOPY (EGD) WITH PROPOFOL;  Surgeon: Clarene Essex, MD;  Location[facility] ENDOSCOPY;  Service: Endoscopy;  Laterality: Left;  . RADIOACTIVE SEED GUIDED MASTECTOMY WITH AXILLARY SENTINEL LYMPH NODE BIOPSY Right 07/17/2015   Procedure: RADIOACTIVE SEED GUIDED PARTIAL MASTECTOMY WITH AXILLARY SENTINEL LYMPH NODE BIOPSY;  Surgeon: Erroll Luna, MD;  Location: [facility];  Service: General;  Laterality: Right;    FAMILY HISTORY: Family History  Problem Relation Age of Onset  . Diabetes Mother   . Colon cancer Father     also had lung  . Diabetes    . Heart attack    . Diabetes Sister     [name]  . Breast cancer Sister     SOCIAL HISTORY: Social History   Social History  . Marital status: Widowed    Spouse name: N/A  . Number of children: 5  . Years of education: 12    Occupational History  . Retired    Social History Main Topics  . Smoking status: Never Smoker  . Smokeless tobacco: Never Used  . Alcohol use No  . Drug use: No  . Sexual activity: Not Currently   Other Topics Concern  . Not on file   Social History Narrative   Retired.    Lives with daughter   Caffeine use: Drinks soda and coffee and tea daily     PHYSICAL EXAM  Vitals:   12/08/15 1508  BP: 120/60  Pulse: 60  Weight: 160 lb (72.6 kg)  Height: 5\' 6"  (1.676 m)   Body mass index is 25.82 kg/m.  Generalized: Well developed, in no acute distress , well-groomed Head: normocephalic and atraumatic,. Oropharynx benign  Neck: Supple, no carotid bruits  Cardiac: Regular rate rhythm, no murmur  Musculoskeletal: No deformity   Neurological examination   Mentation: Alert  oriented to time, place, history taking. Attention span and concentration appropriate. Recent and remote memory intact.  Follows all commands speech and language fluent.   Cranial nerve II-XII: Pupils were equal round reactive to light extraocular movements were full, visual field were full on confrontational test. Facial sensation and strength were normal. hearing was intact to finger rubbing bilaterally. Uvula tongue midline. head turning and shoulder shrug were normal and symmetric.Tongue protrusion into cheek strength was normal. Motor: normal bulk and tone, full strength in the BUE, BLE, except right upper extremity 4 out of 5 and poor grip. Sensory: normal and symmetric to light touch, pinprick, and  Vibration, in the upper and lower extremities, except mildly decreased pinprick right lower extremity and right upper extremity Coordination: finger-nose-finger, heel-to-shin bilaterally, dysmetria on the right Reflexes: Brachioradialis 2/2, biceps 2/2, triceps 2/2, patellar 2/2, Achilles 2/2, plantar responses were flexor bilaterally. Gait and Station: In wheelchair  DIAGNOSTIC DATA (LABS, IMAGING,  TESTING) - I reviewed patient records, labs, notes, testing and imaging myself where available.  Lab Results  Component Value Date   WBC 5.3 09/16/2015   HGB 11.2 (A) 09/16/2015   HCT 36 09/16/2015   MCV 82.1 09/09/2015   PLT 268 09/16/2015      Component Value Date/Time   NA 140 09/16/2015   NA 141 05/07/2015 1251   K 4.4 09/16/2015   K 4.3 05/07/2015 1251   CL 111 09/06/2015 0629   CO2 24 09/06/2015 0629   CO2 22 05/07/2015 1251   GLUCOSE 97 09/06/2015 0629   GLUCOSE 110 05/07/2015 1251   BUN 34 (A) 09/16/2015   BUN 22.5 05/07/2015 1251   CREATININE 1.7 (A) 09/16/2015   CREATININE 1.42 (H) 09/06/2015 0629   CREATININE 1.2 (H) 05/07/2015 1251   CALCIUM 9.0 09/06/2015 0629   CALCIUM 9.1 05/07/2015 1251   PROT 6.0 (L) 09/05/2015 0622   PROT 7.1 05/07/2015 1251   ALBUMIN 2.8 (L) 09/05/2015 0622   ALBUMIN 3.3 (L) 05/07/2015 1251   AST 17 09/16/2015   AST 15 05/07/2015 1251   ALT 3 (A) 09/16/2015   ALT <9 05/07/2015 1251   ALKPHOS 100 09/16/2015   ALKPHOS 95 05/07/2015 1251   BILITOT 0.6 09/05/2015 0622   BILITOT 0.30 05/07/2015 1251   GFRNONAA 34 (L) 09/06/2015 0629   GFRAA 40 (L) 09/06/2015 0629   Lab Results  Component Value Date   CHOL 151 08/25/2015   HDL 47 08/25/2015   LDLCALC 70 08/25/2015   TRIG 168 (H) 08/25/2015   CHOLHDL 3.2 08/25/2015   Lab Results  Component Value Date   HGBA1C 10.2 (H) 08/25/2015    Lab Results  Component Value Date   TSH 0.991 09/07/2015      ASSESSMENT AND PLAN  82 y.o. year old female  has a past medical history of Arthritis; Breast cancer ([place]); CAD (coronary artery disease); Chest pain; Dizziness; DM (diabetes mellitus) ([place]); GERD (gastroesophageal reflux disease); HLD (hyperlipidemia); HTN (hypertension); Obesity; Radiation (09/11/15-10/13/15); Shortness of breath dyspnea; Stroke [facility]); Here to follow-up for her stroke. The patient is a current patient of Dr. Erlinda Hong  who is out of the office today . This note is sent to  the work in doctor.     PLAN: Stressed the importance of management of risk factors to prevent further stroke Continue aspirin and Plavix for secondary stroke prevention Maintain strict control of hypertension with blood pressure goal below 130/90, today's reading 120/60 continue antihypertensive medications Control of diabetes with hemoglobin A1c below 6.5  followed by primary care most recent 10.2  continue diabetic medications  Cholesterol with LDL cholesterol less than 70, followed by primary care,   continue statin drugs Crestor Continue physical therapy and occupational therapy  Will set up for sleep study for daytime drowsiness ESS 12 Follow-up with me in 3 months This was a prolonged visit requiring 45 minutes and medical decision making of high complexity with extensive review of history, hospital chart, counseling and answering questions Dennie Bible, [facility], [facility], APRN  [facility] [address] [place], [place] [address] [phone]

## 2015-12-08 NOTE — Patient Instructions (Addendum)
Stressed the importance of management of risk factors to prevent further stroke Continue aspirin and Plavix for secondary stroke prevention Maintain strict control of hypertension with blood pressure goal below 130/90, today's reading 120/60 continue antihypertensive medications Control of diabetes with hemoglobin A1c below 6.5 followed by primary care most recent 10.2  continue diabetic medications  Cholesterol with LDL cholesterol less than 70, followed by primary care,   continue statin drugs Crestor Continue physical therapy and to start speech therapy Will set up for sleep study Follow-up with me in 3 months

## 2015-12-09 ENCOUNTER — Encounter: Payer: Self-pay | Admitting: Physical Therapy

## 2015-12-09 ENCOUNTER — Ambulatory Visit: Payer: Medicare Other | Admitting: Occupational Therapy

## 2015-12-09 ENCOUNTER — Ambulatory Visit: Payer: Medicare Other | Admitting: Physical Therapy

## 2015-12-09 VITALS — BP 168/62 | HR 68

## 2015-12-09 DIAGNOSIS — R278 Other lack of coordination: Secondary | ICD-10-CM

## 2015-12-09 DIAGNOSIS — M6281 Muscle weakness (generalized): Secondary | ICD-10-CM

## 2015-12-09 DIAGNOSIS — M25611 Stiffness of right shoulder, not elsewhere classified: Secondary | ICD-10-CM

## 2015-12-09 DIAGNOSIS — R2689 Other abnormalities of gait and mobility: Secondary | ICD-10-CM

## 2015-12-09 DIAGNOSIS — R2681 Unsteadiness on feet: Secondary | ICD-10-CM

## 2015-12-09 NOTE — Progress Notes (Signed)
I have reviewed and agreed above plan. 

## 2015-12-09 NOTE — Therapy (Signed)
[facility] [address] [place], [place], [address] Phone: [phone]   Fax:  [phone]  Occupational Therapy Treatment  Patient Details  Name: Glenda [name] MRN: [id number] Date of Birth: 12/09/1933 Referring Provider: Dr. Donnie Coffin  Encounter Date: 12/09/2015      OT End of Session - 12/09/15 1349    Visit Number 9   Number of Visits 17   Date for OT Re-Evaluation 01/04/16   Authorization Type UHC MCR   Authorization - Visit Number 9   Authorization - Number of Visits 10   OT Start Time 1320   OT Stop Time 1400   OT Time Calculation (min) 40 min   Activity Tolerance Patient tolerated treatment well   Behavior During Therapy [facility] for tasks assessed/performed      Past Medical History:  Diagnosis Date  . Arthritis   . Breast cancer ([place])   . CAD (coronary artery disease)   . Chest pain   . Dizziness   . DM (diabetes mellitus) ([place])   . GERD (gastroesophageal reflux disease)   . HLD (hyperlipidemia)   . HTN (hypertension)   . Obesity   . Radiation 09/11/15-10/13/15   42.72 Gy to right breast, 12 Gy boost to right breast  . Shortness of breath dyspnea   . Stroke ([place])   . UTI (urinary tract infection)     Past Surgical History:  Procedure Laterality Date  . CHOLECYSTECTOMY    . colon polyps; hx    . CORONARY ANGIOPLASTY  02/14/09  . ESOPHAGOGASTRODUODENOSCOPY (EGD) WITH PROPOFOL Left 09/08/2015   Procedure: ESOPHAGOGASTRODUODENOSCOPY (EGD) WITH PROPOFOL;  Surgeon: Clarene Essex, MD;  Location: [facility] ENDOSCOPY;  Service: Endoscopy;  Laterality: Left;  . RADIOACTIVE SEED GUIDED MASTECTOMY WITH AXILLARY SENTINEL LYMPH NODE BIOPSY Right 07/17/2015   Procedure: RADIOACTIVE SEED GUIDED PARTIAL MASTECTOMY WITH AXILLARY SENTINEL LYMPH NODE BIOPSY;  Surgeon: Erroll Luna, MD;  Location: [facility];  Service: General;  Laterality: Right;    There were no vitals filed for this visit.      Subjective  Assessment - 12/09/15 1420    Subjective  Pt saw neurologist on Monday, he is monitoring her condition   Pertinent History CVA 08/24/15   Patient Stated Goals To do everything I did before   Currently in Pain? No/denies          Treatment: closed chain shoulder flexion and chest press in seated in front of mirror with therapist facilitating RUE at elbow due to decreased RUE control, mod facilitation. Seated flipping and dealing playing cards with bilateral UE's then picking up, stacking coins and placing in container, rotating ball and screwing nuts/ bolts together, min difficulty with LUE, mod difficulty and v.c.for RUE. Handwriting activities for increased legibility and size, min v.c., Pt demonstrates improved ability to write her name legibly, however she demonstrates decreased ability to sustain letter size when writing a sentence.                       OT Short Term Goals - 12/09/15 1401      OT SHORT TERM GOAL #1   Title I with inital HEP.   Time 4   Period Weeks   Status Achieved     OT SHORT TERM GOAL #2   Title Pt will demonstrate ability to retrieve a lightweight object at 110* with RUE.   Time 4   Period Weeks   Status On-going     OT SHORT  TERM GOAL #3   Title Pt will demonstrate improved fine motor coordination as evidenced by decreasing 9 hole peg test score by 4 secs bilaterally.   Baseline RUE 59 secs, LUE 42.40 secs   Time 4   Period Weeks   Status On-going     OT SHORT TERM GOAL #4   Title Pt will perform simple snack prep or light home management task in standing with min A without LOB demonstrating good safety awareness.   Time 4   Period Weeks   Status Achieved  in clinic     OT SHORT TERM GOAL #5   Title Pt will demonstrate ability to write a short paragraph with 95% legibility.   Time 4   Period Weeks   Status On-going     OT SHORT TERM GOAL #6   Title Further assess cognition and set goal PRN   Time 4   Period Weeks    Status Deferred  Pt to receive ST in the future will defer           OT Long Term Goals - 11/06/15 1247      OT LONG TERM GOAL #1   Title I with updated HEP.   Time 8   Period Weeks   Status New     OT LONG TERM GOAL #2   Title Pt will perform simple cooking task in standing with supervision demonstrating good safety awareness.   Time 8   Period Weeks   Status New     OT LONG TERM GOAL #3   Title Pt will demonstrate ability to retrieve a light weight object at 120 shoulder flexion with RUE   Time 8   Period Weeks   Status New     OT LONG TERM GOAL #4   Title Pt will resume use of RUE as dominant hand at least 75% of the time for ADLs/IADLs.   Time 8   Period Weeks   Status New     OT LONG TERM GOAL #5   Title Pt will decrease RUE 9 hole peg test score to 49 secs from for increased fine motor coordination.   Baseline RUE 59 secs   Time 8     Long Term Additional Goals   Additional Long Term Goals Yes     OT LONG TERM GOAL #6   Title Pt will be modified indpendent with all basic ADLs.   Time 8   Period Weeks   Status New               Plan - 12/09/15 1357    Clinical Impression Statement Pt saw neurologist on Monday. They are going to send her for a sleep study. Therapist requested order for ST. Pt is progressing towards goals for UE functional use.   Rehab Potential Good   OT Frequency 2x / week   OT Duration 8 weeks   OT Treatment/Interventions Self-care/ADL training;Therapeutic exercise;Patient/family education;Functional Mobility Training;Neuromuscular education;Manual Therapy;Therapeutic exercises;DME and/or AE instruction;Therapeutic activities;Electrical Stimulation;Fluidtherapy;Cognitive remediation/compensation;Moist Heat;Passive range of motion;Visual/perceptual remediation/compensation   Plan continue to work towards goals   Consulted and Agree with Plan of Care Patient;Family member/caregiver   Family Member Consulted daughter       Patient will benefit from skilled therapeutic intervention in order to improve the following deficits and impairments:  Abnormal gait, Decreased coordination, Decreased range of motion, Difficulty walking, Decreased safety awareness, Decreased endurance, Decreased activity tolerance, Decreased knowledge of precautions, Impaired UE functional use, Decreased knowledge of use  of DME, Decreased balance, Decreased cognition, Decreased mobility, Decreased strength, Impaired perceived functional ability  Visit Diagnosis: Muscle weakness (generalized)  Unsteadiness on feet  Other lack of coordination  Shoulder stiffness, right    Problem List Patient Active Problem List   Diagnosis Date Noted  . Somnolence, daytime 12/08/2015  . Cerebral thrombosis with cerebral infarction 09/07/2015  . Lacunar infarct, acute ([place])   . Nausea & vomiting 09/05/2015  . Acute kidney injury ([place]) 09/04/2015  . Type II diabetes mellitus with neurological manifestations, uncontrolled ([place]) 09/03/2015  . Bilateral lower extremity edema 09/03/2015  . Essential hypertension   . Diabetes mellitus type 2 in nonobese ([facility])   . History of breast cancer   . AKI (acute kidney injury) ([place])   . Acute blood loss anemia   . Hemiparesis ([place])   . Gait disturbance, post-stroke   . Dysphagia, post-stroke   . Acute ischemic stroke/right cerebellar 08/24/2015  . Breast cancer of upper-outer quadrant of right female breast ([place]) 04/30/2015  . CVA (cerebral infarction) 07/05/2014  . Hyperlipidemia 07/05/2014  . Syncope 07/04/2014  . Mild mitral regurgitation by prior echocardiogram 12/20/2011  . Diabetes mellitus type 1, uncontrolled, insulin dependent ([place]) 02/26/2009  . OBESITY 02/26/2009  . Uncontrolled hypertension 02/26/2009  . CAD (coronary artery disease) 02/26/2009  . GASTROESOPHAGEAL REFLUX DISEASE 02/26/2009    RINE,KATHRYN 12/09/2015, 2:21 PM Theone Murdoch, OTR/L [phone] Phone: [phone] 2:21 PM 12/09/15 [facility] [address] [place], [place], [address] Phone: [phone]   Fax:  [phone]  Name: Myrtle Boie MRN: [id number] Date of Birth: Nov 23, 1933

## 2015-12-10 ENCOUNTER — Telehealth: Payer: Self-pay | Admitting: *Deleted

## 2015-12-10 DIAGNOSIS — I639 Cerebral infarction, unspecified: Secondary | ICD-10-CM

## 2015-12-10 NOTE — Therapy (Signed)
[facility] [address] [place], [place], [address] Phone: [phone]   Fax:  [phone]  Physical Therapy Treatment  Patient Details  Name: Glenda [name] MRN: [id number] Date of Birth: 1933-11-20 Referring Provider: Donnie Coffin, MD  Encounter Date: 12/09/2015   12/09/15 1240  PT Visits / Re-Eval  Visit Number 12  Number of Visits 17  Date for PT Re-Evaluation 12/30/15  Authorization  Authorization Type UHC MCR- G code needed every 10th visit  PT Time Calculation  PT Start Time 1235  PT Stop Time 1315  PT Time Calculation (min) 40 min  PT - End of Session  Equipment Utilized During Treatment Gait belt  Activity Tolerance Patient tolerated treatment well;Patient limited by fatigue  Behavior During Therapy [facility] for tasks assessed/performed     Past Medical History:  Diagnosis Date  . Arthritis   . Breast cancer ([place])   . CAD (coronary artery disease)   . Chest pain   . Dizziness   . DM (diabetes mellitus) ([place])   . GERD (gastroesophageal reflux disease)   . HLD (hyperlipidemia)   . HTN (hypertension)   . Obesity   . Radiation 09/11/15-10/13/15   42.72 Gy to right breast, 12 Gy boost to right breast  . Shortness of breath dyspnea   . Stroke ([place])   . UTI (urinary tract infection)     Past Surgical History:  Procedure Laterality Date  . CHOLECYSTECTOMY    . colon polyps; hx    . CORONARY ANGIOPLASTY  02/14/09  . ESOPHAGOGASTRODUODENOSCOPY (EGD) WITH PROPOFOL Left 09/08/2015   Procedure: ESOPHAGOGASTRODUODENOSCOPY (EGD) WITH PROPOFOL;  Surgeon: Clarene Essex, MD;  Location: [facility] ENDOSCOPY;  Service: Endoscopy;  Laterality: Left;  . RADIOACTIVE SEED GUIDED MASTECTOMY WITH AXILLARY SENTINEL LYMPH NODE BIOPSY Right 07/17/2015   Procedure: RADIOACTIVE SEED GUIDED PARTIAL MASTECTOMY WITH AXILLARY SENTINEL LYMPH NODE BIOPSY;  Surgeon: Erroll Luna, MD;  Location: [facility];  Service: General;  Laterality: Right;     Vitals:   12/09/15 1239  BP: (!) 168/62  Pulse: 68     12/09/15 1239  Symptoms/Limitations  Subjective No new complaints. No falls or pain to report. Neuro Md is going to do sleep study, intial consult in on Aug 29th to set up study.  Patient is accompained by: Family member  Limitations Walking;House hold activities  Patient Stated Goals "I want to work on walking"   Pain Assessment  Currently in Pain? No/denies  Pain Score 0      12/09/15 1244  Transfers  Transfers Sit to Stand;Stand to Sit  Sit to Stand 4: Min guard;4: Min assist;With upper extremity assist;From chair/3-in-1  Sit to Stand Details Verbal cues for sequencing;Verbal cues for technique;Verbal cues for safe use of DME/AE  Stand to Sit 4: Min guard;4: Min assist;With upper extremity assist;To chair/3-in-1;Uncontrolled descent  Stand to Sit Details (indicate cue type and reason) Verbal cues for sequencing;Verbal cues for technique;Verbal cues for precautions/safety;Verbal cues for safe use of DME/AE  Ambulation/Gait  Ambulation/Gait Yes  Ambulation/Gait Assistance 4: Min guard;4: Min assist  Ambulation/Gait Assistance Details focused on trialing multilple combo's for right foot clearance with gait to decrease toe scuffing/foot drop: 1st rep was with no AFO/toe cap with multiple episodes of right foot scuffing/catching with gait, 2cd rep with just right posterior walkon ottobock AFO, 3rd rep with AFO/simulated toe cap and 4th lap with just simulated toe cap. pt needed cues on posture and step/stride length with all gait reps. most efficient clearance of  right foot was noted with use of AFO/toe cap and just toe cap laps. Pt does tend to have increased "freezing like" episodes with turns, obstacle negotiation and when presented with busy enviroments.                                    Ambulation Distance (Feet) 115 Feet (x 4 reps)  Assistive device Rolling walker (Rt AFO, )  Gait Pattern Step-through pattern;Decreased  stride length;Decreased step length - right;Decreased step length - left;Decreased stance time - right;Decreased hip/knee flexion - right;Decreased weight shift to right;Shuffle;Ataxic;Trunk flexed;Narrow base of support;Poor foot clearance - right  Ambulation Surface Level;Indoor          PT Education - 12/09/15 2109    Education provided Yes   Education Details proper shoe's needed with use of AFO's. Used clinic shoes today due to pt has on dress flats and brought in [employer] with elastic laces as second option.   Person(s) Educated Patient;Child(ren)   Methods Explanation;Demonstration   Comprehension Verbalized understanding;Returned demonstration          PT Short Term Goals - 12/03/15 1107      PT SHORT TERM GOAL #1   Title Pt will initiate HEP for strengthening and balance in order to improve functional mobility.  (Target Date: 11/28/15)   Baseline 12/03/15: met today after reinstruction with pictures to daughter/pt   Status Achieved     PT SHORT TERM GOAL #2   Title Pt will improve TUG score to <80 seconds in order to indicate decreased fall risk.     Baseline 11/25/15: 52.44 sec's with RW and simulated toe cap    Status Achieved     PT SHORT TERM GOAL #3   Title Will assess BERG and improve score by 4 points in order to indicate decreased fall risk and functional improvement in balance.     Baseline 11/27/15: 17/56 today (was 12/56 at baseline)   Status Achieved     PT SHORT TERM GOAL #4   Title Pt will ambulate over indoor surfaces x 150' w/ RW at S level in order to indicate safe negotiation in home.     Baseline 11/25/15: pt able to ambulate this distance with varied amout of assistance (min guard to min assist)   Status Partially Met     PT SHORT TERM GOAL #5   Title Pt will verbalize fall prevention strategies in order to indicate decreased fall risk inside and outside of home.     Baseline 12/03/15: fall prevention strategies issued today with review with pt and  daughter   Status Partially Met     PT SHORT TERM GOAL #6   Title Pt will perform 8/10 sit <>stands with single UE support only at mod I level in order to indicate improved functional strength.     Baseline 12/03/15: pt continues to need min>mod assist to power up and to control descent with sitting down using 1-2 UE's to assist.   Status Not Met           PT Long Term Goals - 11/04/15 2033      PT LONG TERM GOAL #1   Title Pt will be independent with HEP in order to indicate improved functional mobility and decreased fall risk.  (Target Date: 12/26/15)   Status New     PT LONG TERM GOAL #2   Title Pt will improve TUG  score to <40 secs in order to indicate decreased fall risk.     Status New     PT LONG TERM GOAL #3   Title Pt will improve BERG balance score 8 points from baseline in order to indicate decreased fall risk.     Baseline 11/04/15: baseline Berg balance score is 12/56   Status New     PT LONG TERM GOAL #4   Title Pt will perform 8/10 sit<>stand without UE support at mod I level in order to indicate improved functional strength.     Status New     PT LONG TERM GOAL #5   Title Pt will ambulate 300' w/ LRAD over paved outdoor surfaces at mod I level in order to indicate safe return to community.       PT LONG TERM GOAL #6   Title Pt will negotiate up/down 5 steps w/ single rail at mod I level in order to indicate safe entry into home/family's homes.     Status New        12/09/15 1240  Plan  Clinical Impression Statement Todays session focused on gait with empahsis on increased right foot clearance/decreased effort in advancing foot. Trialed posterior walk on ottobock alone, combined with simulated toe cap and just simulated toe cap with no AFO in session. No significant differnance was noted between AFO/toe cap combined and toe cap alone. However with just AFO pt still had toe scuffing/catching with swing phase. Pt is quick to fatigue and needed seated rest breaks  between gait trials. Pt's daughter present and willing to purchase tennis shoes (pt perfers dress flats or slide on shoes with elastic laces) if needed to allow for use of AFO. Pt did report AFO used today felt "heavy" therefore did not trial any other ones due to the Ottobock is one of the lightest ones available. Pt may benefit from formal orthotic consult prior ot making a concrete decision. Will discuss with primary PT.   Pt will benefit from skilled therapeutic intervention in order to improve on the following deficits Abnormal gait;Decreased activity tolerance;Decreased balance;Decreased coordination;Decreased endurance;Decreased knowledge of use of DME;Decreased mobility;Decreased strength;Dizziness;Increased edema;Impaired perceived functional ability;Impaired flexibility;Impaired UE functional use;Impaired vision/preception;Improper body mechanics;Postural dysfunction  Rehab Potential Excellent  PT Frequency 2x / week  PT Duration 8 weeks  PT Treatment/Interventions ADLs/Self Care Home Management;Electrical Stimulation;DME Instruction;Gait training;Stair training;Functional mobility training;Therapeutic activities;Therapeutic exercise;Balance training;Neuromuscular re-education;Patient/family education;Orthotic Fit/Training;Vestibular;Visual/perceptual remediation/compensation  PT Next Visit Plan Continue to work on gait and assess less restrictive orthotic needs for right LE;intiate balance activities and continue to work on strengthening/activity tolerance activities  Consulted and Agree with Plan of Care Patient;Family member/caregiver  Family Member Consulted daughter       Patient will benefit from skilled therapeutic intervention in order to improve the following deficits and impairments:  Abnormal gait, Decreased activity tolerance, Decreased balance, Decreased coordination, Decreased endurance, Decreased knowledge of use of DME, Decreased mobility, Decreased strength, Dizziness,  Increased edema, Impaired perceived functional ability, Impaired flexibility, Impaired UE functional use, Impaired vision/preception, Improper body mechanics, Postural dysfunction  Visit Diagnosis: Unsteadiness on feet  Other abnormalities of gait and mobility  Muscle weakness (generalized)     Problem List Patient Active Problem List   Diagnosis Date Noted  . Somnolence, daytime 12/08/2015  . Cerebral thrombosis with cerebral infarction 09/07/2015  . Lacunar infarct, acute ([place])   . Nausea & vomiting 09/05/2015  . Acute kidney injury ([place]) 09/04/2015  . Type II diabetes mellitus with neurological manifestations, uncontrolled (  [place]) 09/03/2015  . Bilateral lower extremity edema 09/03/2015  . Essential hypertension   . Diabetes mellitus type 2 in nonobese ([facility])   . History of breast cancer   . AKI (acute kidney injury) ([place])   . Acute blood loss anemia   . Hemiparesis ([place])   . Gait disturbance, post-stroke   . Dysphagia, post-stroke   . Acute ischemic stroke/right cerebellar 08/24/2015  . Breast cancer of upper-outer quadrant of right female breast ([place]) 04/30/2015  . CVA (cerebral infarction) 07/05/2014  . Hyperlipidemia 07/05/2014  . Syncope 07/04/2014  . Mild mitral regurgitation by prior echocardiogram 12/20/2011  . Diabetes mellitus type 1, uncontrolled, insulin dependent ([place]) 02/26/2009  . OBESITY 02/26/2009  . Uncontrolled hypertension 02/26/2009  . CAD (coronary artery disease) 02/26/2009  . GASTROESOPHAGEAL REFLUX DISEASE 02/26/2009    Willow Ora, PTA, [facility] [address], [place], [place] [address] [phone] 12/10/15, 9:10 PM   Name: Maye Parkinson MRN: [id number] Date of Birth: 06-17-1933

## 2015-12-10 NOTE — Telephone Encounter (Signed)
-----   Message from Dennie Bible, NP sent at 12/09/2015  5:32 PM EDT ----- Regarding: FW: referral for ST Please order this thanks ----- Message ----- From: Hulda Marin, OT Sent: 12/09/2015   1:36 PM To: Drema Balzarine, PTA, Berniece Andreas, PT, # Subject: referral for ST                                Ms. Netha, [place] demonstrates recent speech changes, and can benefit from [place]. If you agree please make this referral. Thanks, Theone Murdoch, OTR/L

## 2015-12-10 NOTE — Telephone Encounter (Signed)
Referral made to ST.

## 2015-12-11 ENCOUNTER — Encounter: Payer: Self-pay | Admitting: Rehabilitation

## 2015-12-11 ENCOUNTER — Ambulatory Visit: Payer: Medicare Other | Admitting: Rehabilitation

## 2015-12-11 ENCOUNTER — Ambulatory Visit: Payer: Medicare Other | Admitting: Occupational Therapy

## 2015-12-11 ENCOUNTER — Telehealth: Payer: Self-pay | Admitting: Rehabilitation

## 2015-12-11 DIAGNOSIS — M6281 Muscle weakness (generalized): Secondary | ICD-10-CM

## 2015-12-11 DIAGNOSIS — I69398 Other sequelae of cerebral infarction: Secondary | ICD-10-CM

## 2015-12-11 DIAGNOSIS — R278 Other lack of coordination: Secondary | ICD-10-CM

## 2015-12-11 DIAGNOSIS — R2689 Other abnormalities of gait and mobility: Secondary | ICD-10-CM

## 2015-12-11 DIAGNOSIS — I639 Cerebral infarction, unspecified: Secondary | ICD-10-CM

## 2015-12-11 DIAGNOSIS — R269 Unspecified abnormalities of gait and mobility: Secondary | ICD-10-CM

## 2015-12-11 DIAGNOSIS — R2681 Unsteadiness on feet: Secondary | ICD-10-CM

## 2015-12-11 DIAGNOSIS — G8190 Hemiplegia, unspecified affecting unspecified side: Secondary | ICD-10-CM

## 2015-12-11 NOTE — Therapy (Signed)
[facility] [address] [place], [place], [address] Phone: [phone]   Fax:  [phone]  Occupational Therapy Treatment  Patient Details  Name: Glenda [name] MRN: [id number] Date of Birth: [birth year] Referring Provider: Dr. Donnie Coffin  Encounter Date: 12/11/2015      OT End of Session - 12/11/15 1424    Visit Number 10   Number of Visits 17   Date for OT Re-Evaluation 01/04/16   Authorization Type UHC MCR   Authorization Time Period G code next visit!   Authorization - Visit Number 10   Authorization - Number of Visits 20   OT Start Time 2023   OT Stop Time 1445   OT Time Calculation (min) 40 min   Activity Tolerance Patient tolerated treatment well   Behavior During Therapy WFL for tasks assessed/performed      Past Medical History:  Diagnosis Date  . Arthritis   . Breast cancer ([place])   . CAD (coronary artery disease)   . Chest pain   . Dizziness   . DM (diabetes mellitus) ([place])   . GERD (gastroesophageal reflux disease)   . HLD (hyperlipidemia)   . HTN (hypertension)   . Obesity   . Radiation 09/11/15-10/13/15   42.72 Gy to right breast, 12 Gy boost to right breast  . Shortness of breath dyspnea   . Stroke ([place])   . UTI (urinary tract infection)     Past Surgical History:  Procedure Laterality Date  . CHOLECYSTECTOMY    . colon polyps; hx    . CORONARY ANGIOPLASTY  02/14/09  . ESOPHAGOGASTRODUODENOSCOPY (EGD) WITH PROPOFOL Left 09/08/2015   Procedure: ESOPHAGOGASTRODUODENOSCOPY (EGD) WITH PROPOFOL;  Surgeon: Clarene Essex, MD;  Location: [facility] ENDOSCOPY;  Service: Endoscopy;  Laterality: Left;  . RADIOACTIVE SEED GUIDED MASTECTOMY WITH AXILLARY SENTINEL LYMPH NODE BIOPSY Right 07/17/2015   Procedure: RADIOACTIVE SEED GUIDED PARTIAL MASTECTOMY WITH AXILLARY SENTINEL LYMPH NODE BIOPSY;  Surgeon: Erroll Luna, MD;  Location: [facility];  Service: General;  Laterality: Right;    There were no vitals  filed for this visit.      Subjective Assessment - 12/11/15 1613    Pertinent History CVA 08/24/15   Patient Stated Goals To do everything I did before   Currently in Pain? No/denies              Treatment: Therapist reviewed ball exercises in supine for chest press, and shoulder flexion with pt/ daughter. Pt required min / mod v.c.for RUE position. Fine motor coordination activities: tossing catching ball and rotating with bilateral UE's, flipping and dealing playing cards. Pt demonstrates mod difficulty with RUE, and min difficulty with LUE overall.                 OT Education - 12/11/15 1611    Education provided Yes   Education Details Reviewed ball exercises in supine, and coordination activities in seated    Person(s) Educated Patient;Child(ren)  Glenda [name]-pt's daughter , she has not attended yet and needed info regarding exercises   Methods Explanation;Demonstration;Verbal cues   Comprehension Verbalized understanding;Returned demonstration;Verbal cues required          OT Short Term Goals - 12/11/15 1615      OT SHORT TERM GOAL #1   Title I with inital HEP.   Time 4   Period Weeks   Status Achieved     OT SHORT TERM GOAL #2   Title Pt will demonstrate ability to retrieve a lightweight object at  110* with RUE.   Time 4   Period Weeks   Status On-going  grossly 100*     OT SHORT TERM GOAL #3   Title Pt will demonstrate improved fine motor coordination as evidenced by decreasing 9 hole peg test score by 4 secs bilaterally.   Baseline RUE 59 secs, LUE 42.40 secs   Time 4   Period Weeks   Status Partially Met  56.07 for RUE, 32.56 for LUE     OT SHORT TERM GOAL #4   Title Pt will perform simple snack prep or light home management task in standing with min A without LOB demonstrating good safety awareness.   Time 4   Period Weeks   Status Achieved  in clinic     OT SHORT TERM GOAL #5   Title Pt will demonstrate ability to write a short  paragraph with 95% legibility.   Time 4   Period Weeks   Status On-going     OT SHORT TERM GOAL #6   Title Further assess cognition and set goal PRN   Time 4   Period Weeks   Status Deferred  Pt to receive ST in the future will defer           OT Long Term Goals - 11/06/15 1247      OT LONG TERM GOAL #1   Title I with updated HEP.   Time 8   Period Weeks   Status New     OT LONG TERM GOAL #2   Title Pt will perform simple cooking task in standing with supervision demonstrating good safety awareness.   Time 8   Period Weeks   Status New     OT LONG TERM GOAL #3   Title Pt will demonstrate ability to retrieve a light weight object at 120 shoulder flexion with RUE   Time 8   Period Weeks   Status New     OT LONG TERM GOAL #4   Title Pt will resume use of RUE as dominant hand at least 75% of the time for ADLs/IADLs.   Time 8   Period Weeks   Status New     OT LONG TERM GOAL #5   Title Pt will decrease RUE 9 hole peg test score to 49 secs from for increased fine motor coordination.   Baseline RUE 59 secs   Time 8     Long Term Additional Goals   Additional Long Term Goals Yes     OT LONG TERM GOAL #6   Title Pt will be modified indpendent with all basic ADLs.   Time 8   Period Weeks   Status New               Plan - 12/11/15 1613    Clinical Impression Statement Pt is progressing towards goals however she demonstrates increased RUE weakness and coordination deficits initally after her TIA. Pt alos demonstrates right inattention and delayed processing. PT can benefit from continued skilled OT to address these deficits.   Rehab Potential Good   OT Frequency 2x / week   OT Duration 8 weeks   OT Treatment/Interventions Self-care/ADL training;Therapeutic exercise;Patient/family education;Functional Mobility Training;Neuromuscular education;Manual Therapy;Therapeutic exercises;DME and/or AE instruction;Therapeutic activities;Electrical  Stimulation;Fluidtherapy;Cognitive remediation/compensation;Moist Heat;Passive range of motion;Visual/perceptual remediation/compensation   Plan continue to work toward unmet goals   Consulted and Agree with Plan of Care Patient;Family member/caregiver   Family Member Consulted daughter- Glenda [name]      Patient will benefit from skilled  therapeutic intervention in order to improve the following deficits and impairments:  Abnormal gait, Decreased coordination, Decreased range of motion, Difficulty walking, Decreased safety awareness, Decreased endurance, Decreased activity tolerance, Decreased knowledge of precautions, Impaired UE functional use, Decreased knowledge of use of DME, Decreased balance, Decreased cognition, Decreased mobility, Decreased strength, Impaired perceived functional ability  Visit Diagnosis: Muscle weakness (generalized)  Unsteadiness on feet  Other abnormalities of gait and mobility  Other lack of coordination      G-Codes - 12/12/2015 1431    Functional Assessment Tool Used 9 hole peg test RUE 56.07, LUE 32.56 clinical impressions, Pt is min-supervsion with basic ADLS, dependent for IADLs.   Functional Limitation Self care   Self Care Current Status [phone]) At least 40 percent but less than 60 percent impaired, limited or restricted   Self Care Goal Status (D5520) At least 1 percent but less than 20 percent impaired, limited or restricted      Problem List Patient Active Problem List   Diagnosis Date Noted  . Somnolence, daytime 12/08/2015  . Cerebral thrombosis with cerebral infarction 09/07/2015  . Lacunar infarct, acute ([place])   . Nausea & vomiting 09/05/2015  . Acute kidney injury ([place]) 09/04/2015  . Type II diabetes mellitus with neurological manifestations, uncontrolled ([place]) 09/03/2015  . Bilateral lower extremity edema 09/03/2015  . Essential hypertension   . Diabetes mellitus type 2 in nonobese ([facility])   . History of breast cancer   . AKI (acute kidney  injury) ([place])   . Acute blood loss anemia   . Hemiparesis ([place])   . Gait disturbance, post-stroke   . Dysphagia, post-stroke   . Acute ischemic stroke/right cerebellar 08/24/2015  . Breast cancer of upper-outer quadrant of right female breast ([place]) 04/30/2015  . CVA (cerebral infarction) 07/05/2014  . Hyperlipidemia 07/05/2014  . Syncope 07/04/2014  . Mild mitral regurgitation by prior echocardiogram 12/20/2011  . Diabetes mellitus type 1, uncontrolled, insulin dependent ([place]) 02/26/2009  . OBESITY 02/26/2009  . Uncontrolled hypertension 02/26/2009  . CAD (coronary artery disease) 02/26/2009  . GASTROESOPHAGEAL REFLUX DISEASE 02/26/2009  Occupational Therapy Progress Note  Dates of Reporting Period: 11/05/15 to December 12, 2015  Objective Reports of Subjective Statement: see above  Objective Measurements: 9 hole peg test, see above  Goal Update: see goals above  Plan: Pt can benefit from continued skilled occupational therapy to address bilateral functional strength and coordination for ADLS/IADLs.  Reason Skilled Services are Required: Pt has had a small setback since recent TIA. Pt demonstrates continued decreased UE strength, decreased coordination, cognitive deficits and decreased balance which impedes ADLs/ IADLs. Glenda [name] 12-Dec-2015, 4:20 PM Theone Murdoch, OTR/L [phone] Phone: [phone] 4:20 PM 12-12-15 [facility] [address] [place], [place], [address] Phone: [phone]   Fax:  [phone]  Name: Glenda [name] MRN: [id number] Date of Birth: [birth year]

## 2015-12-11 NOTE — Therapy (Signed)
[facility] [address] [place], [place], [address] Phone: [phone]   Fax:  [phone]  Physical Therapy Treatment  Patient Details  Name: Glenda [name] MRN: [id number] Date of Birth: 1933/11/01 Referring Provider: Donnie Coffin, MD  Encounter Date: 12/11/2015      PT End of Session - 12/11/15 1511    Visit Number 13   Number of Visits 17   Date for PT Re-Evaluation 12/30/15   Authorization Type UHC MCR- G code needed every 10th visit   PT Start Time 1316   PT Stop Time 1400   PT Time Calculation (min) 44 min   Equipment Utilized During Treatment Gait belt   Activity Tolerance Patient tolerated treatment well;Patient limited by fatigue   Behavior During Therapy [facility] for tasks assessed/performed      Past Medical History:  Diagnosis Date  . Arthritis   . Breast cancer ([place])   . CAD (coronary artery disease)   . Chest pain   . Dizziness   . DM (diabetes mellitus) ([place])   . GERD (gastroesophageal reflux disease)   . HLD (hyperlipidemia)   . HTN (hypertension)   . Obesity   . Radiation 09/11/15-10/13/15   42.72 Gy to right breast, 12 Gy boost to right breast  . Shortness of breath dyspnea   . Stroke ([place])   . UTI (urinary tract infection)     Past Surgical History:  Procedure Laterality Date  . CHOLECYSTECTOMY    . colon polyps; hx    . CORONARY ANGIOPLASTY  02/14/09  . ESOPHAGOGASTRODUODENOSCOPY (EGD) WITH PROPOFOL Left 09/08/2015   Procedure: ESOPHAGOGASTRODUODENOSCOPY (EGD) WITH PROPOFOL;  Surgeon: Clarene Essex, MD;  Location: [facility] ENDOSCOPY;  Service: Endoscopy;  Laterality: Left;  . RADIOACTIVE SEED GUIDED MASTECTOMY WITH AXILLARY SENTINEL LYMPH NODE BIOPSY Right 07/17/2015   Procedure: RADIOACTIVE SEED GUIDED PARTIAL MASTECTOMY WITH AXILLARY SENTINEL LYMPH NODE BIOPSY;  Surgeon: Erroll Luna, MD;  Location: [facility];  Service: General;  Laterality: Right;    There were no vitals filed for this  visit.      Subjective Assessment - 12/11/15 1322    Subjective States she has been walking at home with walker with family.    Patient is accompained by: Family member   Limitations Walking;House hold activities   Patient Stated Goals "I want to work on walking"    Currently in Pain? No/denies              Gait:  Continued to address gait with use of R walk on AFO with her personal tennis shoes (borrowed from granddaughter, but only 1/2 size bigger which worked well with brace).  Note marked improvement in R foot clearance, however did note intermittent R toe scuff, esp when fatigued or making turns.  Discussed getting order for brace and also getting toe cap for improved clearance esp when fatigued.             [facility] Adult PT Treatment/Exercise - 12/11/15 1350      Transfers   Transfers Sit to Stand;Stand to Sit   Sit to Stand 4: Min guard;5: Supervision   Sit to Stand Details Verbal cues for sequencing;Verbal cues for technique;Verbal cues for precautions/safety;Tactile cues for weight shifting   Stand to Sit 5: Supervision   Stand to Sit Details (indicate cue type and reason) Verbal cues for sequencing;Verbal cues for technique;Verbal cues for precautions/safety   Comments Pt continues to require cues for increased forward weight shift during sit<>stand.  Ambulation/Gait   Ambulation/Gait Yes   Ambulation/Gait Assistance 5: Supervision;4: Min guard   Ambulation Distance (Feet) 115 Feet  x 1, then another 50 x 2   Assistive device Rolling walker  R walk on AFO   Gait Pattern Step-through pattern;Decreased stride length;Decreased step length - right;Decreased step length - left;Decreased stance time - right;Decreased hip/knee flexion - right;Decreased weight shift to right;Shuffle;Ataxic;Trunk flexed;Narrow base of support;Poor foot clearance - right   Ambulation Surface Level;Indoor   Stairs Yes   Stairs Assistance 5: Supervision;4: Min guard   Stairs  Assistance Details (indicate cue type and reason) Min cues for safe technique when descending.     Stair Management Technique Two rails;Step to pattern;Forwards   Number of Stairs 4   Height of Stairs 6   Gait Comments Skilled session addressed gait with use of AFO as in previous session.  Note that pt with personal tennis shoes today, which are better fit than shoes used in last visit.  Assisted with donning R walk on AFO (PLS) and ambulated x 115' with RW.  Note marked improvement in ability to clear RLE, however requires cues for increased step length on the L to increase time spent in RLE stance.  Also note mild toe drag during turns and when increased fatigue present.  Ambulated another 38' x 2 reps as above and again, began to note intermittent toe catch.  Had lenghty discussion with pt and daughter Wray Kearns regarding getting order for brace and also getting toe cap to improve clearance.  Pt and daughter verbalized understanding.         Self Care:  Discussion with pt and daughter regarding extending POC for 4 more weeks, scheduling conflicts and getting placed on wait list for openings, and getting order for AFO.  Pt and daughter verbalized understanding.           PT Education - 12/11/15 1510    Education provided Yes   Education Details Discussed getting order for AFO, using shoes donned during this session, getting toe cap, scheduling conflict (adding 4 more weeks of care), and safety when using regular shoes to attend church.     Person(s) Educated Patient;Child(ren)   Methods Explanation;Demonstration   Comprehension Verbalized understanding;Returned demonstration          PT Short Term Goals - 12/03/15 1107      PT SHORT TERM GOAL #1   Title Pt will initiate HEP for strengthening and balance in order to improve functional mobility.  (Target Date: 11/28/15)   Baseline 12/03/15: met today after reinstruction with pictures to daughter/pt   Status Achieved     PT SHORT TERM GOAL  #2   Title Pt will improve TUG score to <80 seconds in order to indicate decreased fall risk.     Baseline 11/25/15: 52.44 sec's with RW and simulated toe cap    Status Achieved     PT SHORT TERM GOAL #3   Title Will assess BERG and improve score by 4 points in order to indicate decreased fall risk and functional improvement in balance.     Baseline 11/27/15: 17/56 today (was 12/56 at baseline)   Status Achieved     PT SHORT TERM GOAL #4   Title Pt will ambulate over indoor surfaces x 150' w/ RW at S level in order to indicate safe negotiation in home.     Baseline 11/25/15: pt able to ambulate this distance with varied amout of assistance (min guard to min assist)  Status Partially Met     PT SHORT TERM GOAL #5   Title Pt will verbalize fall prevention strategies in order to indicate decreased fall risk inside and outside of home.     Baseline 12/03/15: fall prevention strategies issued today with review with pt and daughter   Status Partially Met     PT SHORT TERM GOAL #6   Title Pt will perform 8/10 sit <>stands with single UE support only at mod I level in order to indicate improved functional strength.     Baseline 12/03/15: pt continues to need min>mod assist to power up and to control descent with sitting down using 1-2 UE's to assist.   Status Not Met           PT Long Term Goals - 11/04/15 2033      PT LONG TERM GOAL #1   Title Pt will be independent with HEP in order to indicate improved functional mobility and decreased fall risk.  (Target Date: 12/26/15)   Status New     PT LONG TERM GOAL #2   Title Pt will improve TUG score to <40 secs in order to indicate decreased fall risk.     Status New     PT LONG TERM GOAL #3   Title Pt will improve BERG balance score 8 points from baseline in order to indicate decreased fall risk.     Baseline 11/04/15: baseline Berg balance score is 12/56   Status New     PT LONG TERM GOAL #4   Title Pt will perform 8/10 sit<>stand without UE  support at mod I level in order to indicate improved functional strength.     Status New     PT LONG TERM GOAL #5   Title Pt will ambulate 300' w/ LRAD over paved outdoor surfaces at mod I level in order to indicate safe return to community.       PT LONG TERM GOAL #6   Title Pt will negotiate up/down 5 steps w/ single rail at mod I level in order to indicate safe entry into home/family's homes.     Status New               Plan - 12/11/15 1342    Clinical Impression Statement Skilled session continues to focus on gait with use of R walk on AFO.  Note she had her personal shoes today (good tennis shoes) and therefore brace fit better in these shoes and ambulation showed marked improvement in R foot clearance.  Continued to note very mild toe catching when fatigued or making turns, but overall feel that this brace is most appropriate and getting toe cap will improve safety.   Pt and daughter verbalized understanding.     Rehab Potential Excellent   PT Frequency 2x / week   PT Duration 8 weeks   PT Treatment/Interventions ADLs/Self Care Home Management;Electrical Stimulation;DME Instruction;Gait training;Stair training;Functional mobility training;Therapeutic activities;Therapeutic exercise;Balance training;Neuromuscular re-education;Patient/family education;Orthotic Fit/Training;Vestibular;Visual/perceptual remediation/compensation   PT Next Visit Plan See if order in EPIC for AFO- to contact Gerald Stabs, ;intiate balance activities and continue to work on strengthening/activity tolerance activities   Consulted and Agree with Plan of Care Patient;Family member/caregiver   Family Member Consulted daughter, Wray Kearns      Patient will benefit from skilled therapeutic intervention in order to improve the following deficits and impairments:  Abnormal gait, Decreased activity tolerance, Decreased balance, Decreased coordination, Decreased endurance, Decreased knowledge of use of DME, Decreased  mobility, Decreased strength,  Dizziness, Increased edema, Impaired perceived functional ability, Impaired flexibility, Impaired UE functional use, Impaired vision/preception, Improper body mechanics, Postural dysfunction  Visit Diagnosis: Muscle weakness (generalized)  Unsteadiness on feet  Other abnormalities of gait and mobility     Problem List Patient Active Problem List   Diagnosis Date Noted  . Somnolence, daytime 12/08/2015  . Cerebral thrombosis with cerebral infarction 09/07/2015  . Lacunar infarct, acute ([place])   . Nausea & vomiting 09/05/2015  . Acute kidney injury ([place]) 09/04/2015  . Type II diabetes mellitus with neurological manifestations, uncontrolled ([place]) 09/03/2015  . Bilateral lower extremity edema 09/03/2015  . Essential hypertension   . Diabetes mellitus type 2 in nonobese ([facility])   . History of breast cancer   . AKI (acute kidney injury) ([place])   . Acute blood loss anemia   . Hemiparesis ([place])   . Gait disturbance, post-stroke   . Dysphagia, post-stroke   . Acute ischemic stroke/right cerebellar 08/24/2015  . Breast cancer of upper-outer quadrant of right female breast ([place]) 04/30/2015  . CVA (cerebral infarction) 07/05/2014  . Hyperlipidemia 07/05/2014  . Syncope 07/04/2014  . Mild mitral regurgitation by prior echocardiogram 12/20/2011  . Diabetes mellitus type 1, uncontrolled, insulin dependent ([place]) 02/26/2009  . OBESITY 02/26/2009  . Uncontrolled hypertension 02/26/2009  . CAD (coronary artery disease) 02/26/2009  . GASTROESOPHAGEAL REFLUX DISEASE 02/26/2009    Cameron Sprang, PT, MPT [facility] [address] [place], [place], [address] Phone: [phone]   Fax:  [phone] 12/11/15, 3:16 PM  Name: Kameelah Minish MRN: [id number] Date of Birth: [birth year]

## 2015-12-11 NOTE — Telephone Encounter (Signed)
Dr. Hassell Done,   I have been working with Ms. Carithers at [facility] for PT.  We have been utilizing R AFO for improved clearance and safety to prevent falls.  This seems to be working well and would recommend consult for R AFO.  Please write order in EPIC if you agree and I will arrange with [facility].    Thanks,  Cameron Sprang, PT, MPT [facility] [address] [place], [place], [address] Phone: [phone]   Fax:  [phone] 12/11/15, 3:19 PM

## 2015-12-12 ENCOUNTER — Ambulatory Visit: Payer: Medicare Other

## 2015-12-12 DIAGNOSIS — R471 Dysarthria and anarthria: Secondary | ICD-10-CM

## 2015-12-12 DIAGNOSIS — M6281 Muscle weakness (generalized): Secondary | ICD-10-CM | POA: Diagnosis not present

## 2015-12-12 NOTE — Therapy (Signed)
[facility] [address] [place], [place], [address] Phone: [phone]   Fax:  [phone]  Speech Language Pathology Evaluation  Patient Details  Name: Glenda [name] MRN: [id number] Date of Birth: [birth year] Referring Provider: Evlyn Courier, N.P.  Encounter Date: 12/12/2015      End of Session - 12/12/15 1539    Visit Number 1   Number of Visits 17   Date for SLP Re-Evaluation 02/20/16   SLP Start Time 1450   SLP Stop Time  1530   SLP Time Calculation (min) 40 min   Activity Tolerance Patient tolerated treatment well      Past Medical History:  Diagnosis Date  . Arthritis   . Breast cancer ([place])   . CAD (coronary artery disease)   . Chest pain   . Dizziness   . DM (diabetes mellitus) ([place])   . GERD (gastroesophageal reflux disease)   . HLD (hyperlipidemia)   . HTN (hypertension)   . Obesity   . Radiation 09/11/15-10/13/15   42.72 Gy to right breast, 12 Gy boost to right breast  . Shortness of breath dyspnea   . Stroke ([place])   . UTI (urinary tract infection)     Past Surgical History:  Procedure Laterality Date  . CHOLECYSTECTOMY    . colon polyps; hx    . CORONARY ANGIOPLASTY  02/14/09  . ESOPHAGOGASTRODUODENOSCOPY (EGD) WITH PROPOFOL Left 09/08/2015   Procedure: ESOPHAGOGASTRODUODENOSCOPY (EGD) WITH PROPOFOL;  Surgeon: Clarene Essex, MD;  Location: [facility] ENDOSCOPY;  Service: Endoscopy;  Laterality: Left;  . RADIOACTIVE SEED GUIDED MASTECTOMY WITH AXILLARY SENTINEL LYMPH NODE BIOPSY Right 07/17/2015   Procedure: RADIOACTIVE SEED GUIDED PARTIAL MASTECTOMY WITH AXILLARY SENTINEL LYMPH NODE BIOPSY;  Surgeon: Erroll Luna, MD;  Location: [place];  Service: General;  Laterality: Right;    There were no vitals filed for this visit.      Subjective Assessment - 12/12/15 1502    Subjective Pt does not think she needs ST.   Patient is accompained by: --  daughter   Currently in Pain? No/denies             SLP Evaluation [facility] - 12/12/15 1502      SLP Visit Information   SLP Received On 12/12/15   Referring Provider Evlyn Courier, N.P.   Onset Date late July   Medical Diagnosis CVA     General Information   HPI Pt with CVA in Spring 2017 resulting in need for ST for swallowing. Pt was receiving OT/PT at this facility and had another CVA approx 3 weeks ago that has now affected her speech clarity.     Prior Functional Status   Cognitive/Linguistic Baseline Baseline deficits  memory     Cognition   Overall Cognitive Status History of cognitive impairments - at baseline   Area of Impairment Memory     Auditory Comprehension   Overall Auditory Comprehension Impaired at baseline  daughter ?s if hearing, or understanding issue(same pre-CVA)     Verbal Expression   Overall Verbal Expression Appears within functional limits for tasks assessed     Oral Motor/Sensory Function   Overall Oral Motor/Sensory Function Impaired   Labial ROM Within Functional Limits   Labial Symmetry Abnormal symmetry left   Labial Strength Reduced   Labial Coordination WFL   Lingual ROM Within Functional Limits   Lingual Symmetry Abnormal symmetry left   Lingual Strength Reduced Left   Lingual Coordination Reduced   Velum Impaired left  Motor Speech   Overall Motor Speech Impaired   Respiration --   Level of Impairment --   Phonation Low vocal intensity   Articulation Impaired   Level of Impairment Phrase   Intelligibility Intelligible   Effective Techniques Over-articulate  pt improved her speech clarity at sentence level                         SLP Education - 01-01-2016 1532    Education provided Yes   Education Details HEP (tongue twisters)   Person(s) Educated Patient;Child(ren)   Methods Demonstration;Explanation;Verbal cues;Handout   Comprehension Verbalized understanding;Returned demonstration;Verbal cues required;Need further instruction           SLP Short Term Goals - 01-Jan-2016 1658      SLP SHORT TERM GOAL #1   Title pt will perform dysarthria HEP with occasional min A over three sessions   Time 4   Period Weeks   Status New     SLP SHORT TERM GOAL #2   Title pt will demo dysarthria compensations in 85% of sentences over three sessions   Time 4   Period Weeks   Status New          SLP Long Term Goals - 01/01/2016 1700      SLP LONG TERM GOAL #1   Title pt will perfrom HEP for dysarthria with rare min A over three sessions   Time 8   Period Weeks   Status New     SLP LONG TERM GOAL #2   Title pt will demo dysarthria compensations 60% of the time in simple conversation   Time 8   Period Weeks   Status New          Plan - 01-01-2016 1540    Clinical Impression Statement Pt presents with mild dysarthria following a CVA approx three weeks ago. She could have memory difficulties as well but denies this (turning around to ask daughter questions about history, etc).  She would benefit from skilled ST to improve communication skills with family and community.   Speech Therapy Frequency 2x / week   Duration --  8 weeks   Treatment/Interventions Oral motor exercises;Compensatory strategies;Functional tasks;Patient/family education;Cueing hierarchy;Multimodal communcation approach;Internal/external aids;SLP instruction and feedback   Potential to Achieve Goals Good   SLP Home Exercise Plan provided today   Consulted and Agree with Plan of Care Patient;Family member/caregiver      Patient will benefit from skilled therapeutic intervention in order to improve the following deficits and impairments:   Dysarthria and anarthria      G-Codes - Jan 01, 2016 1712    Functional Assessment Tool Used noms - 6 (20%impaired)   Functional Limitations Motor speech   Motor Speech Current Status [phone]) At least 20 percent but less than 40 percent impaired, limited or restricted   Motor Speech Goal Status [id number]) At least 1 percent but  less than 20 percent impaired, limited or restricted      Problem List Patient Active Problem List   Diagnosis Date Noted  . Somnolence, daytime 12/08/2015  . Cerebral thrombosis with cerebral infarction 09/07/2015  . Lacunar infarct, acute ([place])   . Nausea & vomiting 09/05/2015  . Acute kidney injury ([place]) 09/04/2015  . Type II diabetes mellitus with neurological manifestations, uncontrolled ([place]) 09/03/2015  . Bilateral lower extremity edema 09/03/2015  . Essential hypertension   . Diabetes mellitus type 2 in nonobese ([facility])   . History of breast cancer   .  AKI (acute kidney injury) ([place])   . Acute blood loss anemia   . Hemiparesis ([place])   . Gait disturbance, post-stroke   . Dysphagia, post-stroke   . Acute ischemic stroke/right cerebellar 08/24/2015  . Breast cancer of upper-outer quadrant of right female breast ([place]) 04/30/2015  . CVA (cerebral infarction) 07/05/2014  . Hyperlipidemia 07/05/2014  . Syncope 07/04/2014  . Mild mitral regurgitation by prior echocardiogram 12/20/2011  . Diabetes mellitus type 1, uncontrolled, insulin dependent ([place]) 02/26/2009  . OBESITY 02/26/2009  . Uncontrolled hypertension 02/26/2009  . CAD (coronary artery disease) 02/26/2009  . GASTROESOPHAGEAL REFLUX DISEASE 02/26/2009    [facility] ,[place], CCC-SLP  12/12/2015, 5:17 PM  [facility] [address] [place], [place], [address] Phone: [phone]   Fax:  [phone]  Name: Glenda [name] MRN: [id number] Date of Birth: 1934-01-01

## 2015-12-12 NOTE — Patient Instructions (Signed)
   Speech Exercises  Repeat 2 times, 2 times a day   Call the cat "Buttercup" A calendar of [place], [place] Four floors to cover Yellow oil ointment Fellow lovers of felines Catastrophe in [place]' plums The church's chimes chimed Telling time 'til eleven Five valve levers Keep the gate closed Go see that guy Fat cows give milk [employer] Gophers Fat frogs flip freely Kohl's into bed Get that game to Greg Thick thistles stick together Cinnamon aluminum linoleum Black bugs blood Lovely lemon linament Red leather, yellow leather  Big grocery buggy    Purple baby carriage [facility] Proper copper coffee pot Ripe purple cabbage Three free throws [employer] tackled  [employer] dipped the dessert  [facility] that [employer] of [place] Shirts shrink, shells shouldn't [place] [employer] Take the tackle box File the flash message Give me five flapjacks Fundamental relatives Dye the pets purple Talking [place] time after time Dark chocolate chunks Chartered certified accountant genius We played yo-yos yesterday

## 2015-12-15 ENCOUNTER — Ambulatory Visit: Payer: Medicare Other

## 2015-12-15 DIAGNOSIS — M6281 Muscle weakness (generalized): Secondary | ICD-10-CM | POA: Diagnosis not present

## 2015-12-15 DIAGNOSIS — R471 Dysarthria and anarthria: Secondary | ICD-10-CM

## 2015-12-15 NOTE — Therapy (Signed)
[facility] [address] [place], [place], [address] Phone: [phone]   Fax:  [phone]  Speech Language Pathology Treatment  Patient Details  Name: Glenda [name] MRN: [id number] Date of Birth: [birth year] Referring Provider: Evlyn Courier, N.P.  Encounter Date: 12/15/2015      End of Session - 12/15/15 1658    Visit Number 2   Number of Visits 17   Date for SLP Re-Evaluation 02/20/16   SLP Start Time 1533   SLP Stop Time  1615   SLP Time Calculation (min) 42 min   Activity Tolerance Patient tolerated treatment well      Past Medical History:  Diagnosis Date  . Arthritis   . Breast cancer ([place])   . CAD (coronary artery disease)   . Chest pain   . Dizziness   . DM (diabetes mellitus) ([place])   . GERD (gastroesophageal reflux disease)   . HLD (hyperlipidemia)   . HTN (hypertension)   . Obesity   . Radiation 09/11/15-10/13/15   42.72 Gy to right breast, 12 Gy boost to right breast  . Shortness of breath dyspnea   . Stroke ([place])   . UTI (urinary tract infection)     Past Surgical History:  Procedure Laterality Date  . CHOLECYSTECTOMY    . colon polyps; hx    . CORONARY ANGIOPLASTY  02/14/09  . ESOPHAGOGASTRODUODENOSCOPY (EGD) WITH PROPOFOL Left 09/08/2015   Procedure: ESOPHAGOGASTRODUODENOSCOPY (EGD) WITH PROPOFOL;  Surgeon: Clarene Essex, MD;  Location[facility] ENDOSCOPY;  Service: Endoscopy;  Laterality: Left;  . RADIOACTIVE SEED GUIDED MASTECTOMY WITH AXILLARY SENTINEL LYMPH NODE BIOPSY Right 07/17/2015   Procedure: RADIOACTIVE SEED GUIDED PARTIAL MASTECTOMY WITH AXILLARY SENTINEL LYMPH NODE BIOPSY;  Surgeon: Erroll Luna, MD;  Location: [place];  Service: General;  Laterality: Right;    There were no vitals filed for this visit.      Subjective Assessment - 12/15/15 1538    Subjective Pt reports going over HEP once since eval Friday.   Currently in Pain? No/denies               ADULT SLP  TREATMENT - 12/15/15 1538      General Information   Behavior/Cognition Pleasant mood;Cooperative;Alert     Treatment Provided   Treatment provided Cognitive-Linquistic     Cognitive-Linquistic Treatment   Treatment focused on Dysarthria   Skilled Treatment Pt reported reviewing HEP once since eval. SLP reiterated need to perform BID. In structured speech tasks requiring sentence responses pt used overarticulation 65% of the time with occasional min A. In conversation very little if any carryover seen with overarticulation. SLP educated pt's daughter on what overarticulation was, in order to A pt at home.     Assessment / Recommendations / Plan   Plan Continue with current plan of care     Progression Toward Goals   Progression toward goals Progressing toward goals          SLP Education - 12/15/15 1657    Education provided Yes   Education Details overarticulation strategy to compensate, HEP must be completed BID   Person(s) Educated Patient;Child(ren)   Methods Explanation   Comprehension Verbalized understanding          SLP Short Term Goals - 12/15/15 1700      SLP SHORT TERM GOAL #1   Title pt will perform dysarthria HEP with occasional min A over three sessions   Time 4   Period Weeks   Status On-going  SLP SHORT TERM GOAL #2   Title pt will demo dysarthria compensations in 85% of sentences over three sessions   Time 4   Period Weeks   Status On-going          SLP Long Term Goals - 12/15/15 1700      SLP LONG TERM GOAL #1   Title pt will perfrom HEP for dysarthria with rare min A over three sessions   Time 8   Period Weeks   Status On-going     SLP LONG TERM GOAL #2   Title pt will demo dysarthria compensations 60% of the time in simple conversation   Time 8   Period Weeks   Status On-going          Plan - 12/15/15 1658    Clinical Impression Statement Pt presents with mild dysarthria following a CVA approx three weeks ago. SLP reiterated  need to perform HEP BID. She would benefit from skilled ST to improve communication skills with family and community.   Speech Therapy Frequency 2x / week   Treatment/Interventions Oral motor exercises;Compensatory strategies;Functional tasks;Patient/family education;Cueing hierarchy;Multimodal communcation approach;Internal/external aids;SLP instruction and feedback   Potential to Achieve Goals Good   Consulted and Agree with Plan of Care Patient;Family member/caregiver      Patient will benefit from skilled therapeutic intervention in order to improve the following deficits and impairments:   Dysarthria and anarthria    Problem List Patient Active Problem List   Diagnosis Date Noted  . Somnolence, daytime 12/08/2015  . Cerebral thrombosis with cerebral infarction 09/07/2015  . Lacunar infarct, acute ([place])   . Nausea & vomiting 09/05/2015  . Acute kidney injury ([place]) 09/04/2015  . Type II diabetes mellitus with neurological manifestations, uncontrolled ([place]) 09/03/2015  . Bilateral lower extremity edema 09/03/2015  . Essential hypertension   . Diabetes mellitus type 2 in nonobese ([facility])   . History of breast cancer   . AKI (acute kidney injury) ([place])   . Acute blood loss anemia   . Hemiparesis ([place])   . Gait disturbance, post-stroke   . Dysphagia, post-stroke   . Acute ischemic stroke/right cerebellar 08/24/2015  . Breast cancer of upper-outer quadrant of right female breast ([place]) 04/30/2015  . CVA (cerebral infarction) 07/05/2014  . Hyperlipidemia 07/05/2014  . Syncope 07/04/2014  . Mild mitral regurgitation by prior echocardiogram 12/20/2011  . Diabetes mellitus type 1, uncontrolled, insulin dependent ([place]) 02/26/2009  . OBESITY 02/26/2009  . Uncontrolled hypertension 02/26/2009  . CAD (coronary artery disease) 02/26/2009  . GASTROESOPHAGEAL REFLUX DISEASE 02/26/2009    [facility] [place] ,[place], [place]  12/15/2015, 5:01 PM  [facility] [address] [place], [place], [address] Phone: [phone]   Fax:  [phone]   Name: Glenda [name] MRN: [id number] Date of Birth: 03-25-1934

## 2015-12-15 NOTE — Patient Instructions (Signed)
  Please complete the assigned speech therapy homework and return it to your next session.  

## 2015-12-16 ENCOUNTER — Ambulatory Visit: Payer: Medicare Other | Admitting: Physical Therapy

## 2015-12-16 ENCOUNTER — Ambulatory Visit: Payer: Medicare Other | Admitting: Occupational Therapy

## 2015-12-16 ENCOUNTER — Encounter: Payer: Self-pay | Admitting: Physical Therapy

## 2015-12-16 DIAGNOSIS — R278 Other lack of coordination: Secondary | ICD-10-CM

## 2015-12-16 DIAGNOSIS — G8190 Hemiplegia, unspecified affecting unspecified side: Secondary | ICD-10-CM

## 2015-12-16 DIAGNOSIS — M6281 Muscle weakness (generalized): Secondary | ICD-10-CM

## 2015-12-16 DIAGNOSIS — R2689 Other abnormalities of gait and mobility: Secondary | ICD-10-CM

## 2015-12-16 DIAGNOSIS — R2681 Unsteadiness on feet: Secondary | ICD-10-CM

## 2015-12-16 NOTE — Patient Instructions (Addendum)
Fall Prevention in the Home  Falls can cause injuries and can affect people from all age groups. There are many simple things that you can do to make your home safe and to help prevent falls. WHAT CAN I DO ON THE OUTSIDE OF MY HOME?  Regularly repair the edges of walkways and driveways and fix any cracks.  Remove high doorway thresholds.  Trim any shrubbery on the main path into your home.  Use bright outdoor lighting.  Clear walkways of debris and clutter, including tools and rocks.  Regularly check that handrails are securely fastened and in good repair. Both sides of any steps should have handrails.  Install guardrails along the edges of any raised decks or porches.  Have leaves, snow, and ice cleared regularly.  Use sand or salt on walkways during winter months.  In the garage, clean up any spills right away, including grease or oil spills. WHAT CAN I DO IN THE BATHROOM?  Use night lights.  Install grab bars by the toilet and in the tub and shower. Do not use towel bars as grab bars.  Use non-skid mats or decals on the floor of the tub or shower.  If you need to sit down while you are in the shower, use a plastic, non-slip stool..  Keep the floor dry. Immediately clean up any water that spills on the floor.  Remove soap buildup in the tub or shower on a regular basis.  Attach bath mats securely with double-sided non-slip rug tape.  Remove throw rugs and other tripping hazards from the floor. WHAT CAN I DO IN THE BEDROOM?  Use night lights.  Make sure that a bedside light is easy to reach.  Do not use oversized bedding that drapes onto the floor.  Have a firm chair that has side arms to use for getting dressed.  Remove throw rugs and other tripping hazards from the floor. WHAT CAN I DO IN THE KITCHEN?   Clean up any spills right away.  Avoid walking on wet floors.  Place frequently used items in easy-to-reach places.  If you need to reach for something  above you, use a sturdy step stool that has a grab bar.  Keep electrical cables out of the way.  Do not use floor polish or wax that makes floors slippery. If you have to use wax, make sure that it is non-skid floor wax.  Remove throw rugs and other tripping hazards from the floor. WHAT CAN I DO IN THE STAIRWAYS?  Do not leave any items on the stairs.  Make sure that there are handrails on both sides of the stairs. Fix handrails that are broken or loose. Make sure that handrails are as long as the stairways.  Check any carpeting to make sure that it is firmly attached to the stairs. Fix any carpet that is loose or worn.  Avoid having throw rugs at the top or bottom of stairways, or secure the rugs with carpet tape to prevent them from moving.  Make sure that you have a light switch at the top of the stairs and the bottom of the stairs. If you do not have them, have them installed. WHAT ARE SOME OTHER FALL PREVENTION TIPS?  Wear closed-toe shoes that fit well and support your feet. Wear shoes that have rubber soles or low heels.  When you use a stepladder, make sure that it is completely opened and that the sides are firmly locked. Have someone hold the ladder while you   are using it. Do not climb a closed stepladder.  Add color or contrast paint or tape to grab bars and handrails in your home. Place contrasting color strips on the first and last steps.  Use mobility aids as needed, such as canes, walkers, scooters, and crutches.  Turn on lights if it is dark. Replace any light bulbs that burn out.  Set up furniture so that there are clear paths. Keep the furniture in the same spot.  Fix any uneven floor surfaces.  Choose a carpet design that does not hide the edge of steps of a stairway.  Be aware of any and all pets.  Review your medicines with your healthcare provider. Some medicines can cause dizziness or changes in blood pressure, which increase your risk of falling. Talk  with your health care provider about other ways that you can decrease your risk of falls. This may include working with a physical therapist or trainer to improve your strength, balance, and endurance.   This information is not intended to replace advice given to you by your health care provider. Make sure you discuss any questions you have with your health care provider.   Document Released: 04/02/2002 Document Revised: 08/27/2014 Document Reviewed: 05/17/2014 Elsevier Interactive Patient Education 2016 [employer] Inc[employer]  

## 2015-12-16 NOTE — Patient Instructions (Signed)
  1. Grip Strengthening (Resistive Putty)   Squeeze putty using thumb and all fingers. Repeat _15___ times. Do __2__ sessions per day.   2. Roll putty into tube on table and pinch between each finger and thumb x 10 reps each. (do ring and small finger together)

## 2015-12-16 NOTE — Therapy (Signed)
[facility] [address] [place], [place], [address] Phone: [phone]   Fax:  [phone]  Occupational Therapy Treatment  Patient Details  Name: Glenda [name] MRN: [id number] Date of Birth: 01-Mar-1934 Referring Provider: Dr. Donnie Coffin  Encounter Date: 12/16/2015      OT End of Session - 12/16/15 1407    Visit Number 11   Number of Visits 17   Date for OT Re-Evaluation 01/04/16   Authorization Type [employer] MCR   Authorization Time Period G code next visit!   Authorization - Visit Number 11   Authorization - Number of Visits 20   OT Start Time 7096   OT Stop Time 1402   OT Time Calculation (min) 44 min   Activity Tolerance Patient tolerated treatment well      Past Medical History:  Diagnosis Date  . Arthritis   . Breast cancer ([place])   . CAD (coronary artery disease)   . Chest pain   . Dizziness   . DM (diabetes mellitus) ([place])   . GERD (gastroesophageal reflux disease)   . HLD (hyperlipidemia)   . HTN (hypertension)   . Obesity   . Radiation 09/11/15-10/13/15   42.72 Gy to right breast, 12 Gy boost to right breast  . Shortness of breath dyspnea   . Stroke ([place])   . UTI (urinary tract infection)     Past Surgical History:  Procedure Laterality Date  . CHOLECYSTECTOMY    . colon polyps; hx    . CORONARY ANGIOPLASTY  02/14/09  . ESOPHAGOGASTRODUODENOSCOPY (EGD) WITH PROPOFOL Left 09/08/2015   Procedure: ESOPHAGOGASTRODUODENOSCOPY (EGD) WITH PROPOFOL;  Surgeon: Clarene Essex, MD;  Location: [facility] ENDOSCOPY;  Service: Endoscopy;  Laterality: Left;  . RADIOACTIVE SEED GUIDED MASTECTOMY WITH AXILLARY SENTINEL LYMPH NODE BIOPSY Right 07/17/2015   Procedure: RADIOACTIVE SEED GUIDED PARTIAL MASTECTOMY WITH AXILLARY SENTINEL LYMPH NODE BIOPSY;  Surgeon: Erroll Luna, MD;  Location: [facility];  Service: General;  Laterality: Right;    There were no vitals filed for this visit.      Subjective Assessment -  12/16/15 1326    Pertinent History CVA 08/24/15   Patient Stated Goals To do everything I did before   Currently in Pain? No/denies            [facility] OT Assessment - 12/16/15 0001      Hand Function   Right Hand Grip (lbs) 40   Left Hand Grip (lbs) 53                  OT Treatments/Exercises (OP) - 12/16/15 0001      ADLs   Writing Practiced writing with and without built up pen. Pt didn't really notice a difference, therefore did not issue foam for pen. Practiced writing s/s of CVA in cursive with approx. 50% of legibility     Hand Exercises   Other Hand Exercises Putty HEP issued for Rt hand - pt demo each x 10 reps each. See pt instructions     Fine Motor Coordination   Fine Motor Coordination Small Pegboard   Small Pegboard Pt placing small pegs in pegboard Rt hand with min difficulty                OT Education - 12/16/15 1353    Education provided Yes   Education Details Putty HEP    Person(s) Educated Patient   Methods Explanation;Demonstration;Handout   Comprehension Verbalized understanding;Returned demonstration  OT Short Term Goals - 12/16/15 1412      OT SHORT TERM GOAL #1   Title I with inital HEP.   Time 4   Period Weeks   Status Achieved     OT SHORT TERM GOAL #2   Title Pt will demonstrate ability to retrieve a lightweight object at 110* with RUE.   Time 4   Period Weeks   Status On-going  grossly 100*     OT SHORT TERM GOAL #3   Title Pt will demonstrate improved fine motor coordination as evidenced by decreasing 9 hole peg test score by 4 secs bilaterally.   Baseline RUE 59 secs, LUE 42.40 secs   Time 4   Period Weeks   Status Partially Met  56.07 for RUE, 32.56 for LUE     OT SHORT TERM GOAL #4   Title Pt will perform simple snack prep or light home management task in standing with min A without LOB demonstrating good safety awareness.   Time 4   Period Weeks   Status Achieved  in clinic     OT SHORT  TERM GOAL #5   Title Pt will demonstrate ability to write a short paragraph with 95% legibility.   Time 4   Period Weeks   Status Not Met     OT SHORT TERM GOAL #6   Title Further assess cognition and set goal PRN   Time 4   Period Weeks   Status Deferred  Pt to receive ST in the future will defer           OT Long Term Goals - 11/06/15 1247      OT LONG TERM GOAL #1   Title I with updated HEP.   Time 8   Period Weeks   Status New     OT LONG TERM GOAL #2   Title Pt will perform simple cooking task in standing with supervision demonstrating good safety awareness.   Time 8   Period Weeks   Status New     OT LONG TERM GOAL #3   Title Pt will demonstrate ability to retrieve a light weight object at 120 shoulder flexion with RUE   Time 8   Period Weeks   Status New     OT LONG TERM GOAL #4   Title Pt will resume use of RUE as dominant hand at least 75% of the time for ADLs/IADLs.   Time 8   Period Weeks   Status New     OT LONG TERM GOAL #5   Title Pt will decrease RUE 9 hole peg test score to 49 secs from for increased fine motor coordination.   Baseline RUE 59 secs   Time 8     Long Term Additional Goals   Additional Long Term Goals Yes     OT LONG TERM GOAL #6   Title Pt will be modified indpendent with all basic ADLs.   Time 8   Period Weeks   Status New               Plan - 12/16/15 1407    Clinical Impression Statement Pt progressing gradually with RUE ROM, coordination, and strength. Pt had slight set back d/t recent TIA but shows slight improvement today. Pt still impaired with handwriting, but also with ? mild aphasia and difficulty spelling (premorbid per daughter report).    Rehab Potential Good   OT Frequency 2x / week   OT Duration 8  weeks   OT Treatment/Interventions Self-care/ADL training;Therapeutic exercise;Patient/family education;Functional Mobility Training;Neuromuscular education;Manual Therapy;Therapeutic exercises;DME and/or  AE instruction;Therapeutic activities;Electrical Stimulation;Fluidtherapy;Cognitive remediation/compensation;Moist Heat;Passive range of motion;Visual/perceptual remediation/compensation   Plan assess STG #2, and reassess STG #3 for Rt hand, review putty HEP, continue coordination and high level reaching RUE   Consulted and Agree with Plan of Care Patient;Family member/caregiver   Family Member Consulted daughter- Virgie      Patient will benefit from skilled therapeutic intervention in order to improve the following deficits and impairments:  Abnormal gait, Decreased coordination, Decreased range of motion, Difficulty walking, Decreased safety awareness, Decreased endurance, Decreased activity tolerance, Decreased knowledge of precautions, Impaired UE functional use, Decreased knowledge of use of DME, Decreased balance, Decreased cognition, Decreased mobility, Decreased strength, Impaired perceived functional ability  Visit Diagnosis: Other lack of coordination  Hemiparesis [facility])    Problem List Patient Active Problem List   Diagnosis Date Noted  . Somnolence, daytime 12/08/2015  . Cerebral thrombosis with cerebral infarction 09/07/2015  . Lacunar infarct, acute ([place])   . Nausea & vomiting 09/05/2015  . Acute kidney injury ([place]) 09/04/2015  . Type II diabetes mellitus with neurological manifestations, uncontrolled ([place]) 09/03/2015  . Bilateral lower extremity edema 09/03/2015  . Essential hypertension   . Diabetes mellitus type 2 in nonobese ([facility])   . History of breast cancer   . AKI (acute kidney injury) ([place])   . Acute blood loss anemia   . Hemiparesis ([place])   . Gait disturbance, post-stroke   . Dysphagia, post-stroke   . Acute ischemic stroke/right cerebellar 08/24/2015  . Breast cancer of upper-outer quadrant of right female breast ([place]) 04/30/2015  . CVA (cerebral infarction) 07/05/2014  . Hyperlipidemia 07/05/2014  . Syncope 07/04/2014  . Mild mitral regurgitation by  prior echocardiogram 12/20/2011  . Diabetes mellitus type 1, uncontrolled, insulin dependent ([place]) 02/26/2009  . OBESITY 02/26/2009  . Uncontrolled hypertension 02/26/2009  . CAD (coronary artery disease) 02/26/2009  . GASTROESOPHAGEAL REFLUX DISEASE 02/26/2009    Carey Bullocks, OTR/L 12/16/2015, 2:14 PM  [facility] [address] [place], [place], [address] Phone: [phone]   Fax:  [phone]  Name: Glenda [name] MRN: [id number] Date of Birth: 1933-07-19

## 2015-12-18 ENCOUNTER — Encounter: Payer: Self-pay | Admitting: Rehabilitation

## 2015-12-18 ENCOUNTER — Ambulatory Visit: Payer: Medicare Other | Admitting: Occupational Therapy

## 2015-12-18 ENCOUNTER — Ambulatory Visit: Payer: Medicare Other

## 2015-12-18 ENCOUNTER — Ambulatory Visit: Payer: Medicare Other | Admitting: Rehabilitation

## 2015-12-18 DIAGNOSIS — M6281 Muscle weakness (generalized): Secondary | ICD-10-CM | POA: Diagnosis not present

## 2015-12-18 DIAGNOSIS — R2681 Unsteadiness on feet: Secondary | ICD-10-CM

## 2015-12-18 DIAGNOSIS — G8190 Hemiplegia, unspecified affecting unspecified side: Secondary | ICD-10-CM

## 2015-12-18 DIAGNOSIS — R471 Dysarthria and anarthria: Secondary | ICD-10-CM

## 2015-12-18 DIAGNOSIS — R278 Other lack of coordination: Secondary | ICD-10-CM

## 2015-12-18 DIAGNOSIS — R2689 Other abnormalities of gait and mobility: Secondary | ICD-10-CM

## 2015-12-18 NOTE — Patient Instructions (Signed)
LIP and TONGUE exercises  --- DO ALL EXERCISES TWICE EACH DAY  1. Lip press - Press your lips together firmly (like making a /m/ sound) and hold 5 seconds -repeat 10 times  2. LOUD and LONG [place] - make a kissing sound as loud as you can, as long as you can - repeat 10 times  3. Tongue sweep - Sweep your tongue in the upper and lower pockets of your mouth - touch the front of every tooth!  - five revolutions each way  5. Move a licorice stick or foam swab from side to side in your mouth - 10 times, back and forth   6. Say "OOOOO", and "EEEEE" 10 times (pucker, and smile)  7. Put air in your cheeks, and push on either side 10 times  *KEEP THE AIR IN and DON'T LET IT ESCAPE!!*

## 2015-12-18 NOTE — Therapy (Signed)
[facility] [address] [place], [place], [address] Phone: [phone]   Fax:  [phone]  Speech Language Pathology Treatment  Patient Details  Name: Glenda [name] MRN: [id number] Date of Birth: May 29, 1933 Referring Provider: Evlyn Courier, N.P.  Encounter Date: 12/18/2015      End of Session - 12/18/15 1637    Visit Number 3   Number of Visits 17   Date for SLP Re-Evaluation 02/20/16   SLP Start Time 1533   SLP Stop Time  1615   SLP Time Calculation (min) 42 min   Activity Tolerance Patient tolerated treatment well      Past Medical History:  Diagnosis Date  . Arthritis   . Breast cancer ([place])   . CAD (coronary artery disease)   . Chest pain   . Dizziness   . DM (diabetes mellitus) ([place])   . GERD (gastroesophageal reflux disease)   . HLD (hyperlipidemia)   . HTN (hypertension)   . Obesity   . Radiation 09/11/15-10/13/15   42.72 Gy to right breast, 12 Gy boost to right breast  . Shortness of breath dyspnea   . Stroke ([place])   . UTI (urinary tract infection)     Past Surgical History:  Procedure Laterality Date  . CHOLECYSTECTOMY    . colon polyps; hx    . CORONARY ANGIOPLASTY  02/14/09  . ESOPHAGOGASTRODUODENOSCOPY (EGD) WITH PROPOFOL Left 09/08/2015   Procedure: ESOPHAGOGASTRODUODENOSCOPY (EGD) WITH PROPOFOL;  Surgeon: Clarene Essex, MD;  Location: [facility] ENDOSCOPY;  Service: Endoscopy;  Laterality: Left;  . RADIOACTIVE SEED GUIDED MASTECTOMY WITH AXILLARY SENTINEL LYMPH NODE BIOPSY Right 07/17/2015   Procedure: RADIOACTIVE SEED GUIDED PARTIAL MASTECTOMY WITH AXILLARY SENTINEL LYMPH NODE BIOPSY;  Surgeon: Erroll Luna, MD;  Location: [facility];  Service: General;  Laterality: Right;    There were no vitals filed for this visit.      Subjective Assessment - 12/18/15 1539    Subjective Pt is performing HEP once a day. SLP encouraged BID. "She (helper) said I done good!"   Patient is accompained by: --   Arville Go, dtr               ADULT SLP TREATMENT - 12/18/15 1602      General Information   Behavior/Cognition Pleasant mood;Cooperative;Alert     Treatment Provided   Treatment provided Cognitive-Linquistic     Cognitive-Linquistic Treatment   Treatment focused on Dysarthria   Skilled Treatment Reviewed pt's HEP for overarticulation with her. She req'd mod to max A (visual, verbal cues) usually for overarticulation through the ENTIRE phrase/sentence. Encouraged pt to complete x2/day. SLP educated pt/daughter with pt's HEP for dysarthria/strengthening, requiring mod to max A consistently. Daughter stated she could A with HEP as needed.     Assessment / Recommendations / Plan   Plan Continue with current plan of care     Progression Toward Goals   Progression toward goals Progressing toward goals          SLP Education - 12/18/15 1636    Education provided Yes   Education Details HEP - dysarthria/oral strengthening   Person(s) Educated Patient;Child(ren)   Methods Explanation;Demonstration;Verbal cues;Handout   Comprehension Verbalized understanding;Returned demonstration;Verbal cues required;Need further instruction          SLP Short Term Goals - 12/18/15 1638      SLP SHORT TERM GOAL #1   Title pt will perform dysarthria HEP with occasional min A over three sessions   Time 3   Period  Weeks   Status On-going     SLP SHORT TERM GOAL #2   Title pt will demo dysarthria compensations in 85% of sentences over three sessions   Time 3   Period Weeks   Status On-going          SLP Long Term Goals - 12/18/15 1639      SLP LONG TERM GOAL #1   Title pt will perfrom HEP for dysarthria with rare min A over three sessions   Time 7   Period Weeks   Status On-going     SLP LONG TERM GOAL #2   Title pt will demo dysarthria compensations 60% of the time in simple conversation   Time 7   Period Weeks   Status On-going          Plan - 12/18/15 1637     Clinical Impression Statement Pt cont to present with mild-mod dysarthria (likely mod due to fatigue) following a CVA. SLP again reiterated need to perform HEP BID as she has not been completing in that manner. She would benefit from skilled ST to improve communication skills with family and community.   Speech Therapy Frequency 2x / week   Duration --  7 weeks   Treatment/Interventions Oral motor exercises;Compensatory strategies;Functional tasks;Patient/family education;Cueing hierarchy;Multimodal communcation approach;Internal/external aids;SLP instruction and feedback   Potential to Achieve Goals Good   Consulted and Agree with Plan of Care Patient;Family member/caregiver      Patient will benefit from skilled therapeutic intervention in order to improve the following deficits and impairments:   Dysarthria and anarthria    Problem List Patient Active Problem List   Diagnosis Date Noted  . Somnolence, daytime 12/08/2015  . Cerebral thrombosis with cerebral infarction 09/07/2015  . Lacunar infarct, acute ([place])   . Nausea & vomiting 09/05/2015  . Acute kidney injury ([place]) 09/04/2015  . Type II diabetes mellitus with neurological manifestations, uncontrolled ([place]) 09/03/2015  . Bilateral lower extremity edema 09/03/2015  . Essential hypertension   . Diabetes mellitus type 2 in nonobese ([facility])   . History of breast cancer   . AKI (acute kidney injury) ([place])   . Acute blood loss anemia   . Hemiparesis ([place])   . Gait disturbance, post-stroke   . Dysphagia, post-stroke   . Acute ischemic stroke/right cerebellar 08/24/2015  . Breast cancer of upper-outer quadrant of right female breast ([place]) 04/30/2015  . CVA (cerebral infarction) 07/05/2014  . Hyperlipidemia 07/05/2014  . Syncope 07/04/2014  . Mild mitral regurgitation by prior echocardiogram 12/20/2011  . Diabetes mellitus type 1, uncontrolled, insulin dependent ([place]) 02/26/2009  . OBESITY 02/26/2009  . Uncontrolled hypertension  02/26/2009  . CAD (coronary artery disease) 02/26/2009  . GASTROESOPHAGEAL REFLUX DISEASE 02/26/2009    [facility], CCC-SLP  12/18/2015, 4:39 PM  [facility] [address] [place], [place], [address] Phone: [phone]   Fax:  [phone]   Name: Glenda [name] MRN: [id number] Date of Birth: 12-14-1933

## 2015-12-18 NOTE — Therapy (Signed)
[facility] [address] [place], [place], [address] Phone: [phone]   Fax:  [phone]  Physical Therapy Treatment  Patient Details  Name: Glenda [name] MRN: [id number] Date of Birth: 17-Jul-1933 Referring Provider: Donnie Coffin, MD  Encounter Date: 12/18/2015      PT End of Session - 12/18/15 1639    Visit Number 15   Number of Visits 17   Date for PT Re-Evaluation 12/30/15   Authorization Type UHC MCR- G code needed every 10th visit   PT Start Time 1315   PT Stop Time 1400   PT Time Calculation (min) 45 min   Equipment Utilized During Treatment Gait belt   Activity Tolerance Patient tolerated treatment well;Patient limited by fatigue   Behavior During Therapy [facility] for tasks assessed/performed      Past Medical History:  Diagnosis Date  . Arthritis   . Breast cancer ([place])   . CAD (coronary artery disease)   . Chest pain   . Dizziness   . DM (diabetes mellitus) ([place])   . GERD (gastroesophageal reflux disease)   . HLD (hyperlipidemia)   . HTN (hypertension)   . Obesity   . Radiation 09/11/15-10/13/15   42.72 Gy to right breast, 12 Gy boost to right breast  . Shortness of breath dyspnea   . Stroke ([place])   . UTI (urinary tract infection)     Past Surgical History:  Procedure Laterality Date  . CHOLECYSTECTOMY    . colon polyps; hx    . CORONARY ANGIOPLASTY  02/14/09  . ESOPHAGOGASTRODUODENOSCOPY (EGD) WITH PROPOFOL Left 09/08/2015   Procedure: ESOPHAGOGASTRODUODENOSCOPY (EGD) WITH PROPOFOL;  Surgeon: Clarene Essex, MD;  [facility] ENDOSCOPY;  Service: Endoscopy;  Laterality: Left;  . RADIOACTIVE SEED GUIDED MASTECTOMY WITH AXILLARY SENTINEL LYMPH NODE BIOPSY Right 07/17/2015   Procedure: RADIOACTIVE SEED GUIDED PARTIAL MASTECTOMY WITH AXILLARY SENTINEL LYMPH NODE BIOPSY;  Surgeon: Erroll Luna, MD;  Location: [facility];  Service: General;  Laterality: Right;    There were no vitals filed for this  visit.      Subjective Assessment - 12/18/15 1326    Subjective Reports no changes, no falls.  Explained fall to PT, continued education on shoe wear when ambulatory at home.     Patient is accompained by: Family member   Limitations Walking;House hold activities   Patient Stated Goals "I want to work on walking"    Currently in Pain? No/denies            Ortho fit training:  Gerald Stabs from [place] to assess gait with PT and decide appropriate brace to decrease fall risk and improve RLE clearance.  Had pt ambulate x 115' with RW without brace or toe cap to better assess gait mechanics.  Note that she requires continuous cues from PT on step length on RLE.  Note this is from decreased hip strength, but also feel that attention level has to do with performance during session as well as perceptual deficits.  Then ambulated x 115' with RW and R walk on AFO with noted improvement in RLE clearance, however continue to note difficulty at times, esp with turns.  Gerald Stabs feels that adding shoe lift inside of shoe on the L side to further improve RLE clearance.  Discussed this with pt and daughter during session, both verbalized understanding.    Gait:  Performed gait negotiating obstacles with RW during session to address sequencing.  Note that she tends to take very short shuffled steps and picks  up walker and at times tends to allow RW to get too far ahead of her.  Provided cues for larger step lengths and keeping RW moving as well as how to side step through obstacles if needed.    TA:  Performed sit<>stand briefly during session as PT continues to note difficulty with forward weight shift as well as controlled descent when sitting.  Performed x 5 reps during session with cues to continue to perform at home.    Self Care:  Continued education on proper shoe wear in house to prevent falls.                      PT Education - 12/18/15 1638    Education provided Yes   Education Details  Continued education on safety at home, also education on brace and toe cap-Chris from [employer] present to assess and place order.     Person(s) Educated Child(ren);Patient   Methods Explanation   Comprehension Verbalized understanding          PT Short Term Goals - 12/03/15 1107      PT SHORT TERM GOAL #1   Title Pt will initiate HEP for strengthening and balance in order to improve functional mobility.  (Target Date: 11/28/15)   Baseline 12/03/15: met today after reinstruction with pictures to daughter/pt   Status Achieved     PT SHORT TERM GOAL #2   Title Pt will improve TUG score to <80 seconds in order to indicate decreased fall risk.     Baseline 11/25/15: 52.44 sec's with RW and simulated toe cap    Status Achieved     PT SHORT TERM GOAL #3   Title Will assess BERG and improve score by 4 points in order to indicate decreased fall risk and functional improvement in balance.     Baseline 11/27/15: 17/56 today (was 12/56 at baseline)   Status Achieved     PT SHORT TERM GOAL #4   Title Pt will ambulate over indoor surfaces x 150' w/ RW at S level in order to indicate safe negotiation in home.     Baseline 11/25/15: pt able to ambulate this distance with varied amout of assistance (min guard to min assist)   Status Partially Met     PT SHORT TERM GOAL #5   Title Pt will verbalize fall prevention strategies in order to indicate decreased fall risk inside and outside of home.     Baseline 12/03/15: fall prevention strategies issued today with review with pt and daughter   Status Partially Met     PT SHORT TERM GOAL #6   Title Pt will perform 8/10 sit <>stands with single UE support only at mod I level in order to indicate improved functional strength.     Baseline 12/03/15: pt continues to need min>mod assist to power up and to control descent with sitting down using 1-2 UE's to assist.   Status Not Met           PT Long Term Goals - 11/04/15 2033      PT LONG TERM GOAL #1   Title Pt  will be independent with HEP in order to indicate improved functional mobility and decreased fall risk.  (Target Date: 12/26/15)   Status New     PT LONG TERM GOAL #2   Title Pt will improve TUG score to <40 secs in order to indicate decreased fall risk.     Status New  PT LONG TERM GOAL #3   Title Pt will improve BERG balance score 8 points from baseline in order to indicate decreased fall risk.     Baseline 11/04/15: baseline Berg balance score is 12/56   Status New     PT LONG TERM GOAL #4   Title Pt will perform 8/10 sit<>stand without UE support at mod I level in order to indicate improved functional strength.     Status New     PT LONG TERM GOAL #5   Title Pt will ambulate 300' w/ LRAD over paved outdoor surfaces at mod I level in order to indicate safe return to community.       PT LONG TERM GOAL #6   Title Pt will negotiate up/down 5 steps w/ single rail at mod I level in order to indicate safe entry into home/family's homes.     Status New               Plan - 12/18/15 1639    Clinical Impression Statement Skilled session focused on addressing R AFO and toe cap needs with Gerald Stabs from [facility] present during session to assess and place order.  Went with R walk on AFO and will add toe cap when they pick up brace in clinic.  Also continue to address safety with RW, esp when negotiating turns.    Rehab Potential Excellent   PT Frequency 2x / week   PT Duration 8 weeks   PT Treatment/Interventions ADLs/Self Care Home Management;Electrical Stimulation;DME Instruction;Gait training;Stair training;Functional mobility training;Therapeutic activities;Therapeutic exercise;Balance training;Neuromuscular re-education;Patient/family education;Orthotic Fit/Training;Vestibular;Visual/perceptual remediation/compensation   PT Next Visit Plan turns and obstacles with RW, weight shifting NMR activities, strengthening and endurance. (Can start to check LTG's, Raquel Sarna will re-cert)   Consulted  and Agree with Plan of Care Patient;Family member/caregiver   Family Member Consulted daughter,       Patient will benefit from skilled therapeutic intervention in order to improve the following deficits and impairments:  Abnormal gait, Decreased activity tolerance, Decreased balance, Decreased coordination, Decreased endurance, Decreased knowledge of use of DME, Decreased mobility, Decreased strength, Dizziness, Increased edema, Impaired perceived functional ability, Impaired flexibility, Impaired UE functional use, Impaired vision/preception, Improper body mechanics, Postural dysfunction  Visit Diagnosis: Hemiparesis (HCC)  Muscle weakness (generalized)  Unsteadiness on feet  Other abnormalities of gait and mobility     Problem List Patient Active Problem List   Diagnosis Date Noted  . Somnolence, daytime 12/08/2015  . Cerebral thrombosis with cerebral infarction 09/07/2015  . Lacunar infarct, acute ([place])   . Nausea & vomiting 09/05/2015  . Acute kidney injury ([place]) 09/04/2015  . Type II diabetes mellitus with neurological manifestations, uncontrolled ([place]) 09/03/2015  . Bilateral lower extremity edema 09/03/2015  . Essential hypertension   . Diabetes mellitus type 2 in nonobese (HCC)   . History of breast cancer   . AKI (acute kidney injury) ([place])   . Acute blood loss anemia   . Hemiparesis ([place])   . Gait disturbance, post-stroke   . Dysphagia, post-stroke   . Acute ischemic stroke/right cerebellar 08/24/2015  . Breast cancer of upper-outer quadrant of right female breast ([place]) 04/30/2015  . CVA (cerebral infarction) 07/05/2014  . Hyperlipidemia 07/05/2014  . Syncope 07/04/2014  . Mild mitral regurgitation by prior echocardiogram 12/20/2011  . Diabetes mellitus type 1, uncontrolled, insulin dependent ([place]) 02/26/2009  . OBESITY 02/26/2009  . Uncontrolled hypertension 02/26/2009  . CAD (coronary artery disease) 02/26/2009  . GASTROESOPHAGEAL REFLUX DISEASE 02/26/2009  Cameron Sprang, PT, MPT [facility] [address] [place], [place], [address] Phone: [phone]   Fax:  [phone] 12/18/15, 4:43 PM  Name: Glenda [name] MRN: [id number] Date of Birth: 1933-05-26

## 2015-12-18 NOTE — Therapy (Signed)
[facility] [address] [place], [place], [address] Phone: [phone]   Fax:  [phone]  Occupational Therapy Treatment  Patient Details  Name: Glenda [name] MRN: [id number] Date of Birth: 10-May-1933 Referring Provider: Dr. Donnie Coffin  Encounter Date: 12/18/2015      OT End of Session - 12/18/15 1633    Visit Number 12   Number of Visits 17   Date for OT Re-Evaluation 01/04/16   Authorization Type UHC MCR   Authorization - Visit Number 12   Authorization - Number of Visits 20   OT Start Time 1400   OT Stop Time 3354   OT Time Calculation (min) 45 min   Activity Tolerance Patient tolerated treatment well      Past Medical History:  Diagnosis Date  . Arthritis   . Breast cancer ([place])   . CAD (coronary artery disease)   . Chest pain   . Dizziness   . DM (diabetes mellitus) ([place])   . GERD (gastroesophageal reflux disease)   . HLD (hyperlipidemia)   . HTN (hypertension)   . Obesity   . Radiation 09/11/15-10/13/15   42.72 Gy to right breast, 12 Gy boost to right breast  . Shortness of breath dyspnea   . Stroke ([place])   . UTI (urinary tract infection)     Past Surgical History:  Procedure Laterality Date  . CHOLECYSTECTOMY    . colon polyps; hx    . CORONARY ANGIOPLASTY  02/14/09  . ESOPHAGOGASTRODUODENOSCOPY (EGD) WITH PROPOFOL Left 09/08/2015   Procedure: ESOPHAGOGASTRODUODENOSCOPY (EGD) WITH PROPOFOL;  Surgeon: Clarene Essex, MD;  Location[facility] ENDOSCOPY;  Service: Endoscopy;  Laterality: Left;  . RADIOACTIVE SEED GUIDED MASTECTOMY WITH AXILLARY SENTINEL LYMPH NODE BIOPSY Right 07/17/2015   Procedure: RADIOACTIVE SEED GUIDED PARTIAL MASTECTOMY WITH AXILLARY SENTINEL LYMPH NODE BIOPSY;  Surgeon: Erroll Luna, MD;  Location: [facility];  Service: General;  Laterality: Right;    There were no vitals filed for this visit.      Subjective Assessment - 12/18/15 1409    Subjective  My arm is tired   Pertinent History CVA 08/24/15   Patient Stated Goals To do everything I did before   Currently in Pain? No/denies            [facility] OT Assessment - 12/18/15 0001      Coordination   Right 9 Hole Peg Test 56.42 SEC                  OT Treatments/Exercises (OP) - 12/18/15 0001      ADLs   ADL Comments Re-assessed 9 hole peg test Rt hand, but no improvements from when assessed last week.      Functional Reaching Activities   Mid Level Placing small pegs in pegboard vertical surface RUE requiring 80-100* sh. flexion with min cues to follow peg design and/or correct mistakes, and extra time. For coordination, RUE motion, visual perceptual skills, and strength/endurance RUE. Pt fatigues and requires 2-3 rest breaks to finish task   High Level Pt able to retrieve and replace light weight object from high shelf b/t 110-115 degrees sh. flexion RUE                  OT Short Term Goals - 12/18/15 1634      OT SHORT TERM GOAL #1   Title I with inital HEP.   Time 4   Period Weeks   Status Achieved     OT SHORT  TERM GOAL #2   Title Pt will demonstrate ability to retrieve a lightweight object at 110* with RUE.   Time 4   Period Weeks   Status Achieved  grossly 100*     OT SHORT TERM GOAL #3   Title Pt will demonstrate improved fine motor coordination as evidenced by decreasing 9 hole peg test score by 4 secs bilaterally.   Baseline RUE 59 secs, LUE 42.40 secs   Time 4   Period Weeks   Status Partially Met  56.07 for RUE, 32.56 for LUE     OT SHORT TERM GOAL #4   Title Pt will perform simple snack prep or light home management task in standing with min A without LOB demonstrating good safety awareness.   Time 4   Period Weeks   Status Achieved  in clinic     OT SHORT TERM GOAL #5   Title Pt will demonstrate ability to write a short paragraph with 95% legibility.   Time 4   Period Weeks   Status Not Met     OT SHORT TERM GOAL #6   Title Further assess  cognition and set goal PRN   Time 4   Period Weeks   Status Deferred  Pt to receive ST in the future will defer           OT Long Term Goals - 11/06/15 1247      OT LONG TERM GOAL #1   Title I with updated HEP.   Time 8   Period Weeks   Status New     OT LONG TERM GOAL #2   Title Pt will perform simple cooking task in standing with supervision demonstrating good safety awareness.   Time 8   Period Weeks   Status New     OT LONG TERM GOAL #3   Title Pt will demonstrate ability to retrieve a light weight object at 120 shoulder flexion with RUE   Time 8   Period Weeks   Status New     OT LONG TERM GOAL #4   Title Pt will resume use of RUE as dominant hand at least 75% of the time for ADLs/IADLs.   Time 8   Period Weeks   Status New     OT LONG TERM GOAL #5   Title Pt will decrease RUE 9 hole peg test score to 49 secs from for increased fine motor coordination.   Baseline RUE 59 secs   Time 8     Long Term Additional Goals   Additional Long Term Goals Yes     OT LONG TERM GOAL #6   Title Pt will be modified indpendent with all basic ADLs.   Time 8   Period Weeks   Status New               Plan - 12/18/15 1634    Clinical Impression Statement Pt met STG #2. Gradually progressing towards LTG's   OT Frequency 2x / week   OT Duration 8 weeks   OT Treatment/Interventions Self-care/ADL training;Therapeutic exercise;Patient/family education;Functional Mobility Training;Neuromuscular education;Manual Therapy;Therapeutic exercises;DME and/or AE instruction;Therapeutic activities;Electrical Stimulation;Fluidtherapy;Cognitive remediation/compensation;Moist Heat;Passive range of motion;Visual/perceptual remediation/compensation   Plan simple cooking task in standing with close supervision   Consulted and Agree with Plan of Care Patient;Family member/caregiver   Family Member Consulted daughter      Patient will benefit from skilled therapeutic intervention in  order to improve the following deficits and impairments:  Abnormal gait, Decreased coordination,  Decreased range of motion, Difficulty walking, Decreased safety awareness, Decreased endurance, Decreased activity tolerance, Decreased knowledge of precautions, Impaired UE functional use, Decreased knowledge of use of DME, Decreased balance, Decreased cognition, Decreased mobility, Decreased strength, Impaired perceived functional ability  Visit Diagnosis: Other lack of coordination  Hemiparesis (HCC)  Muscle weakness (generalized)    Problem List Patient Active Problem List   Diagnosis Date Noted  . Somnolence, daytime 12/08/2015  . Cerebral thrombosis with cerebral infarction 09/07/2015  . Lacunar infarct, acute ([place])   . Nausea & vomiting 09/05/2015  . Acute kidney injury ([place]) 09/04/2015  . Type II diabetes mellitus with neurological manifestations, uncontrolled ([place]) 09/03/2015  . Bilateral lower extremity edema 09/03/2015  . Essential hypertension   . Diabetes mellitus type 2 in nonobese (HCC)   . History of breast cancer   . AKI (acute kidney injury) ([place])   . Acute blood loss anemia   . Hemiparesis ([place])   . Gait disturbance, post-stroke   . Dysphagia, post-stroke   . Acute ischemic stroke/right cerebellar 08/24/2015  . Breast cancer of upper-outer quadrant of right female breast ([place]) 04/30/2015  . CVA (cerebral infarction) 07/05/2014  . Hyperlipidemia 07/05/2014  . Syncope 07/04/2014  . Mild mitral regurgitation by prior echocardiogram 12/20/2011  . Diabetes mellitus type 1, uncontrolled, insulin dependent ([place]) 02/26/2009  . OBESITY 02/26/2009  . Uncontrolled hypertension 02/26/2009  . CAD (coronary artery disease) 02/26/2009  . GASTROESOPHAGEAL REFLUX DISEASE 02/26/2009    Carey Bullocks, OTR/L 12/18/2015, 4:37 PM  [facility] [address] [place], [place], [address] Phone: [phone]   Fax:   [phone]  Name: Glenda [name] MRN: [id number] Date of Birth: November 27, 1933

## 2015-12-18 NOTE — Therapy (Signed)
[facility] [address] [place], [place], [address] Phone: [phone]   Fax:  [phone]  Physical Therapy Treatment  Patient Details  Name: Glenda [name] MRN: [id number] Date of Birth: 12-19-1933 Referring Provider: Donnie Coffin, MD  Encounter Date: 12/16/2015   12/16/15 1242  PT Visits / Re-Eval  Visit Number 14  Number of Visits 17  Date for PT Re-Evaluation 12/30/15  Authorization  Authorization Type UHC MCR- G code needed every 10th visit  PT Time Calculation  PT Start Time 1235  PT Stop Time 1315  PT Time Calculation (min) 40 min  PT - End of Session  Equipment Utilized During Treatment Gait belt  Activity Tolerance Patient tolerated treatment well;Patient limited by fatigue  Behavior During Therapy [facility] for tasks assessed/performed     Past Medical History:  Diagnosis Date  . Arthritis   . Breast cancer ([place])   . CAD (coronary artery disease)   . Chest pain   . Dizziness   . DM (diabetes mellitus) ([place])   . GERD (gastroesophageal reflux disease)   . HLD (hyperlipidemia)   . HTN (hypertension)   . Obesity   . Radiation 09/11/15-10/13/15   42.72 Gy to right breast, 12 Gy boost to right breast  . Shortness of breath dyspnea   . Stroke ([place])   . UTI (urinary tract infection)     Past Surgical History:  Procedure Laterality Date  . CHOLECYSTECTOMY    . colon polyps; hx    . CORONARY ANGIOPLASTY  02/14/09  . ESOPHAGOGASTRODUODENOSCOPY (EGD) WITH PROPOFOL Left 09/08/2015   Procedure: ESOPHAGOGASTRODUODENOSCOPY (EGD) WITH PROPOFOL;  Surgeon: Clarene Essex, MD;  Location: [facility] ENDOSCOPY;  Service: Endoscopy;  Laterality: Left;  . RADIOACTIVE SEED GUIDED MASTECTOMY WITH AXILLARY SENTINEL LYMPH NODE BIOPSY Right 07/17/2015   Procedure: RADIOACTIVE SEED GUIDED PARTIAL MASTECTOMY WITH AXILLARY SENTINEL LYMPH NODE BIOPSY;  Surgeon: Erroll Luna, MD;  Location: [facility];  Service: General;  Laterality: Right;     There were no vitals filed for this visit.   12/16/15 1239  Symptoms/Limitations  Subjective No new complaints. No pain.  Had a fall in the bathroom last Friday. Was headed in to sit on toilet to begin getting dressed. "Lost her way" and sat in floor. Pt unable to recall what caused her to fall, does remember holding onto towel rack. Reports no injuries. Daughter with her today was not the daugher with her when she fell so is unable to give details as well.   Patient is accompained by: Family member  Limitations Walking;House hold activities  Patient Stated Goals "I want to work on walking"   Pain Assessment  Currently in Pain? No/denies  Pain Score 0      12/16/15 1246  Transfers  Transfers Sit to Stand;Stand to Sit  Neuro Re-ed   Neuro Re-ed Details  standing with wide base of support no UE support: min guard to min assist with following activities: EC no head movements 15-20 sec's x3, EO head movements up<>down and left<>right; EO alternating UE raises, then bil UE raises together. single leg stance activites: alternating fwd toe taps to foam bubbles, alternating cross toe taps to foam bubbles, min to mod assist for balance, cues/facilitation for posture, weight shifting and to slow down for improved balance/control  Knee/Hip Exercises: Supine  Bridges AROM;Strengthening;Both;1 set;10 reps;Limitations  Bridges Limitations cues on form and technique  Straight Leg Raises AROM;Strengthening;Right;1 set;10 reps;Limitations  Straight Leg Raises Limitations cues on form and technique  Single Leg Bridge AROM;Strengthening;Right;1 set;10 reps;Limitations        12/18/15 1302  PT Education  Education provided Yes  Education Details fall prevention strategies, not to walk in socks in home  Person(s) Educated Patient;Child(ren)  Methods Explanation;Demonstration;Verbal cues;Handout  Comprehension Verbalized understanding;Returned  demonstration;Verbal cues required;Need further instruction         PT Short Term Goals - 12/03/15 1107      PT SHORT TERM GOAL #1   Title Pt will initiate HEP for strengthening and balance in order to improve functional mobility.  (Target Date: 11/28/15)   Baseline 12/03/15: met today after reinstruction with pictures to daughter/pt   Status Achieved     PT SHORT TERM GOAL #2   Title Pt will improve TUG score to <80 seconds in order to indicate decreased fall risk.     Baseline 11/25/15: 52.44 sec's with RW and simulated toe cap    Status Achieved     PT SHORT TERM GOAL #3   Title Will assess BERG and improve score by 4 points in order to indicate decreased fall risk and functional improvement in balance.     Baseline 11/27/15: 17/56 today (was 12/56 at baseline)   Status Achieved     PT SHORT TERM GOAL #4   Title Pt will ambulate over indoor surfaces x 150' w/ RW at S level in order to indicate safe negotiation in home.     Baseline 11/25/15: pt able to ambulate this distance with varied amout of assistance (min guard to min assist)   Status Partially Met     PT SHORT TERM GOAL #5   Title Pt will verbalize fall prevention strategies in order to indicate decreased fall risk inside and outside of home.     Baseline 12/03/15: fall prevention strategies issued today with review with pt and daughter   Status Partially Met     PT SHORT TERM GOAL #6   Title Pt will perform 8/10 sit <>stands with single UE support only at mod I level in order to indicate improved functional strength.     Baseline 12/03/15: pt continues to need min>mod assist to power up and to control descent with sitting down using 1-2 UE's to assist.   Status Not Met           PT Long Term Goals - 11/04/15 2033      PT LONG TERM GOAL #1   Title Pt will be independent with HEP in order to indicate improved functional mobility and decreased fall risk.  (Target Date: 12/26/15)   Status New     PT LONG TERM GOAL #2   Title  Pt will improve TUG score to <40 secs in order to indicate decreased fall risk.     Status New     PT LONG TERM GOAL #3   Title Pt will improve BERG balance score 8 points from baseline in order to indicate decreased fall risk.     Baseline 11/04/15: baseline Berg balance score is 12/56   Status New     PT LONG TERM GOAL #4   Title Pt will perform 8/10 sit<>stand without UE support at mod I level in order to indicate improved functional strength.     Status New     PT LONG TERM  GOAL #5   Title Pt will ambulate 300' w/ LRAD over paved outdoor surfaces at mod I level in order to indicate safe return to community.       PT LONG TERM GOAL #6   Title Pt will negotiate up/down 5 steps w/ single rail at mod I level in order to indicate safe entry into home/family's homes.     Status New        12/16/15 1243  Plan  Clinical Impression Statement Today's session addressed fall prevention strategies with handout provided. Remainder of session addressed strengthening and standing balance without any issues reported.   Pt will benefit from skilled therapeutic intervention in order to improve on the following deficits Abnormal gait;Decreased activity tolerance;Decreased balance;Decreased coordination;Decreased endurance;Decreased knowledge of use of DME;Decreased mobility;Decreased strength;Dizziness;Increased edema;Impaired perceived functional ability;Impaired flexibility;Impaired UE functional use;Impaired vision/preception;Improper body mechanics;Postural dysfunction  Rehab Potential Excellent  PT Frequency 2x / week  PT Duration 8 weeks  PT Treatment/Interventions ADLs/Self Care Home Management;Electrical Stimulation;DME Instruction;Gait training;Stair training;Functional mobility training;Therapeutic activities;Therapeutic exercise;Balance training;Neuromuscular re-education;Patient/family education;Orthotic Fit/Training;Vestibular;Visual/perceptual remediation/compensation  PT Next Visit Plan  ;intiate balance activities and continue to work on strengthening/activity tolerance activities  Consulted and Agree with Plan of Care Patient;Family member/caregiver  Family Member Consulted daughter, Virgie       Patient will benefit from skilled therapeutic intervention in order to improve the following deficits and impairments:  Abnormal gait, Decreased activity tolerance, Decreased balance, Decreased coordination, Decreased endurance, Decreased knowledge of use of DME, Decreased mobility, Decreased strength, Dizziness, Increased edema, Impaired perceived functional ability, Impaired flexibility, Impaired UE functional use, Impaired vision/preception, Improper body mechanics, Postural dysfunction  Visit Diagnosis: Muscle weakness (generalized)  Unsteadiness on feet  Other abnormalities of gait and mobility     Problem List Patient Active Problem List   Diagnosis Date Noted  . Somnolence, daytime 12/08/2015  . Cerebral thrombosis with cerebral infarction 09/07/2015  . Lacunar infarct, acute ([place])   . Nausea & vomiting 09/05/2015  . Acute kidney injury ([place]) 09/04/2015  . Type II diabetes mellitus with neurological manifestations, uncontrolled ([place]) 09/03/2015  . Bilateral lower extremity edema 09/03/2015  . Essential hypertension   . Diabetes mellitus type 2 in nonobese ([facility])   . History of breast cancer   . AKI (acute kidney injury) ([place])   . Acute blood loss anemia   . Hemiparesis ([place])   . Gait disturbance, post-stroke   . Dysphagia, post-stroke   . Acute ischemic stroke/right cerebellar 08/24/2015  . Breast cancer of upper-outer quadrant of right female breast ([place]) 04/30/2015  . CVA (cerebral infarction) 07/05/2014  . Hyperlipidemia 07/05/2014  . Syncope 07/04/2014  . Mild mitral regurgitation by prior echocardiogram 12/20/2011  . Diabetes mellitus type 1, uncontrolled, insulin dependent ([place]) 02/26/2009  . OBESITY 02/26/2009  . Uncontrolled hypertension  02/26/2009  . CAD (coronary artery disease) 02/26/2009  . GASTROESOPHAGEAL REFLUX DISEASE 02/26/2009    Willow Ora, PTA, [facility] [address] [place], [place] [address] [phone] 12/18/15, 1:02 PM   Name: Glenda [name] MRN: [id number] Date of Birth: Nov 20, 1933

## 2015-12-23 ENCOUNTER — Ambulatory Visit: Payer: Medicare Other | Admitting: Physical Therapy

## 2015-12-23 ENCOUNTER — Encounter: Payer: Self-pay | Admitting: Neurology

## 2015-12-23 ENCOUNTER — Ambulatory Visit (INDEPENDENT_AMBULATORY_CARE_PROVIDER_SITE_OTHER): Payer: Medicare Other | Admitting: Neurology

## 2015-12-23 ENCOUNTER — Ambulatory Visit: Payer: Medicare Other | Admitting: Occupational Therapy

## 2015-12-23 ENCOUNTER — Encounter: Payer: Self-pay | Admitting: Physical Therapy

## 2015-12-23 VITALS — BP 154/62 | HR 72 | Resp 16

## 2015-12-23 DIAGNOSIS — R0683 Snoring: Secondary | ICD-10-CM

## 2015-12-23 DIAGNOSIS — R2689 Other abnormalities of gait and mobility: Secondary | ICD-10-CM

## 2015-12-23 DIAGNOSIS — G4710 Hypersomnia, unspecified: Secondary | ICD-10-CM

## 2015-12-23 DIAGNOSIS — R278 Other lack of coordination: Secondary | ICD-10-CM

## 2015-12-23 DIAGNOSIS — R471 Dysarthria and anarthria: Secondary | ICD-10-CM | POA: Diagnosis not present

## 2015-12-23 DIAGNOSIS — G8190 Hemiplegia, unspecified affecting unspecified side: Secondary | ICD-10-CM

## 2015-12-23 DIAGNOSIS — I639 Cerebral infarction, unspecified: Secondary | ICD-10-CM

## 2015-12-23 DIAGNOSIS — M6281 Muscle weakness (generalized): Secondary | ICD-10-CM

## 2015-12-23 DIAGNOSIS — M6289 Other specified disorders of muscle: Secondary | ICD-10-CM | POA: Diagnosis not present

## 2015-12-23 DIAGNOSIS — R2681 Unsteadiness on feet: Secondary | ICD-10-CM

## 2015-12-23 DIAGNOSIS — R531 Weakness: Secondary | ICD-10-CM

## 2015-12-23 NOTE — Therapy (Signed)
[facility] [address] [place], [place], [address] Phone: [phone]   Fax:  [phone]  Occupational Therapy Treatment  Patient Details  Name: Glenda [name] MRN: [id number] Date of Birth: April 20, 1934 Referring Provider: Dr. Donnie Coffin  Encounter Date: 12/23/2015      OT End of Session - 12/23/15 1700    Visit Number 13   Number of Visits 17   Date for OT Re-Evaluation 01/04/16   Authorization Type UHC MCR   Authorization Time Period G code next visit!   Authorization - Visit Number 13   Authorization - Number of Visits 20   OT Start Time 1320   OT Stop Time 1400   OT Time Calculation (min) 40 min   Activity Tolerance Patient tolerated treatment well   Behavior During Therapy [facility] for tasks assessed/performed      Past Medical History:  Diagnosis Date  . Arthritis   . Breast cancer ([place])   . CAD (coronary artery disease)   . Chest pain   . Dizziness   . DM (diabetes mellitus) ([place])   . GERD (gastroesophageal reflux disease)   . HLD (hyperlipidemia)   . HTN (hypertension)   . Obesity   . Radiation 09/11/15-10/13/15   42.72 Gy to right breast, 12 Gy boost to right breast  . Shortness of breath dyspnea   . Stroke ([place])   . UTI (urinary tract infection)     Past Surgical History:  Procedure Laterality Date  . CHOLECYSTECTOMY    . colon polyps; hx    . CORONARY ANGIOPLASTY  02/14/09  . ESOPHAGOGASTRODUODENOSCOPY (EGD) WITH PROPOFOL Left 09/08/2015   Procedure: ESOPHAGOGASTRODUODENOSCOPY (EGD) WITH PROPOFOL;  Surgeon: Clarene Essex, MD;  Location: [facility] ENDOSCOPY;  Service: Endoscopy;  Laterality: Left;  . RADIOACTIVE SEED GUIDED MASTECTOMY WITH AXILLARY SENTINEL LYMPH NODE BIOPSY Right 07/17/2015   Procedure: RADIOACTIVE SEED GUIDED PARTIAL MASTECTOMY WITH AXILLARY SENTINEL LYMPH NODE BIOPSY;  Surgeon: Erroll Luna, MD;  Location: [facility];  Service: General;  Laterality: Right;    There were no vitals  filed for this visit.      Subjective Assessment - 12/23/15 1657    Subjective  My arm is tired   Pertinent History CVA 08/24/15   Patient Stated Goals To do everything I did before   Currently in Pain? No/denies            Treatment: Seated shoulder flexion and shoulder abduction unilateral with hemiglide . Closed chain shoulder flexion midrange with ball , min-mod facilitation for RUE. Standing to place large pegs in vertical pegboard with RUE. Pt felt a little dizzy so pt completed in seated. Therapist monitored pt's BP and it was within a normal range. Pt alternated low range reaching with RUE and high range reaching with LUE, min v.c. To avoid trunk compensations.                     OT Short Term Goals - 12/18/15 1634      OT SHORT TERM GOAL #1   Title I with inital HEP.   Time 4   Period Weeks   Status Achieved     OT SHORT TERM GOAL #2   Title Pt will demonstrate ability to retrieve a lightweight object at 110* with RUE.   Time 4   Period Weeks   Status Achieved  grossly 100*     OT SHORT TERM GOAL #3   Title Pt will demonstrate improved fine motor coordination  as evidenced by decreasing 9 hole peg test score by 4 secs bilaterally.   Baseline RUE 59 secs, LUE 42.40 secs   Time 4   Period Weeks   Status Partially Met  56.07 for RUE, 32.56 for LUE     OT SHORT TERM GOAL #4   Title Pt will perform simple snack prep or light home management task in standing with min A without LOB demonstrating good safety awareness.   Time 4   Period Weeks   Status Achieved  in clinic     OT SHORT TERM GOAL #5   Title Pt will demonstrate ability to write a short paragraph with 95% legibility.   Time 4   Period Weeks   Status Not Met     OT SHORT TERM GOAL #6   Title Further assess cognition and set goal PRN   Time 4   Period Weeks   Status Deferred  Pt to receive ST in the future will defer           OT Long Term Goals - 11/06/15 1247      OT  LONG TERM GOAL #1   Title I with updated HEP.   Time 8   Period Weeks   Status New     OT LONG TERM GOAL #2   Title Pt will perform simple cooking task in standing with supervision demonstrating good safety awareness.   Time 8   Period Weeks   Status New     OT LONG TERM GOAL #3   Title Pt will demonstrate ability to retrieve a light weight object at 120 shoulder flexion with RUE   Time 8   Period Weeks   Status New     OT LONG TERM GOAL #4   Title Pt will resume use of RUE as dominant hand at least 75% of the time for ADLs/IADLs.   Time 8   Period Weeks   Status New     OT LONG TERM GOAL #5   Title Pt will decrease RUE 9 hole peg test score to 49 secs from for increased fine motor coordination.   Baseline RUE 59 secs   Time 8     Long Term Additional Goals   Additional Long Term Goals Yes     OT LONG TERM GOAL #6   Title Pt will be modified indpendent with all basic ADLs.   Time 8   Period Weeks   Status New               Plan - 12/23/15 1354    Clinical Impression Statement Pt is progressing towards goals for bilateral  UE functional use and strength.   Rehab Potential Good   OT Frequency 2x / week   OT Duration 8 weeks   OT Treatment/Interventions Self-care/ADL training;Therapeutic exercise;Patient/family education;Functional Mobility Training;Neuromuscular education;Manual Therapy;Therapeutic exercises;DME and/or AE instruction;Therapeutic activities;Electrical Stimulation;Fluidtherapy;Cognitive remediation/compensation;Moist Heat;Passive range of motion;Visual/perceptual remediation/compensation   Plan simple cooking task in standing with close supervision(kitchen was not available today)   Consulted and Agree with Plan of Care Patient;Family member/caregiver      Patient will benefit from skilled therapeutic intervention in order to improve the following deficits and impairments:  Abnormal gait, Decreased coordination, Decreased range of motion,  Difficulty walking, Decreased safety awareness, Decreased endurance, Decreased activity tolerance, Decreased knowledge of precautions, Impaired UE functional use, Decreased knowledge of use of DME, Decreased balance, Decreased cognition, Decreased mobility, Decreased strength, Impaired perceived functional ability  Visit Diagnosis: Other lack  of coordination  Hemiparesis ([place])  Muscle weakness (generalized)  Unsteadiness on feet  Other abnormalities of gait and mobility    Problem List Patient Active Problem List   Diagnosis Date Noted  . Somnolence, daytime 12/08/2015  . Cerebral thrombosis with cerebral infarction 09/07/2015  . Lacunar infarct, acute ([place])   . Nausea & vomiting 09/05/2015  . Acute kidney injury ([place]) 09/04/2015  . Type II diabetes mellitus with neurological manifestations, uncontrolled ([place]) 09/03/2015  . Bilateral lower extremity edema 09/03/2015  . Essential hypertension   . Diabetes mellitus type 2 in nonobese ([facility])   . History of breast cancer   . AKI (acute kidney injury) ([place])   . Acute blood loss anemia   . Hemiparesis ([place])   . Gait disturbance, post-stroke   . Dysphagia, post-stroke   . Acute ischemic stroke/right cerebellar 08/24/2015  . Breast cancer of upper-outer quadrant of right female breast ([place]) 04/30/2015  . CVA (cerebral infarction) 07/05/2014  . Hyperlipidemia 07/05/2014  . Syncope 07/04/2014  . Mild mitral regurgitation by prior echocardiogram 12/20/2011  . Diabetes mellitus type 1, uncontrolled, insulin dependent ([place]) 02/26/2009  . OBESITY 02/26/2009  . Uncontrolled hypertension 02/26/2009  . CAD (coronary artery disease) 02/26/2009  . GASTROESOPHAGEAL REFLUX DISEASE 02/26/2009    Carol Theys 12/23/2015, 5:21 PM Theone Murdoch, OTR/L [phone] Phone: [phone] 5:21 PM 12/23/15 [facility] [address] [place], [place], [address] Phone: [phone]    Fax:  [phone]  Name: Zhoe Catania MRN: [id number] Date of Birth: Feb 28, 1934

## 2015-12-23 NOTE — Progress Notes (Signed)
Subjective:    Patient ID: Glenda [name] is a 82 y.o. female.  HPI     Glenda Age, MD, PhD [facility] [address] [place], [place] [address]  Dear Glenda [name] and Glenda [name],   I saw your patient, Glenda [name], upon your kind request in my clinic today for initial consultation of her sleep disorder, in particular, concern for underlying obstructive sleep apnea. The patient is accompanied by her daughter, Glenda [name], today. As you know, Glenda [name] is a 82 year old right-handed woman with an underlying medical history of arthritis, breast cancer, status post right mastectomy in March 2017, and radiation therapy in May and June 2017, coronary artery disease, status post angioplasty in 2010, type 2 diabetes, reflux disease, hyperlipidemia, hypertension, overweight state, and ischemic stroke of the right cerebellar peduncle in April 2017, who reports snoring and excessive daytime somnolence. I reviewed your office note from 12/08/2015. Her Epworth Sleepiness Scale score is 9 out of 24 today, her fatigue score is 27 out of 63 today. She is noted to take naps. She denies morning headaches, nocturia on a night to night basis and denies RLS symptoms.  She is a nonsmoker, she does not drink alcohol, she drinks caffeine occasionally, she tries to drink enough water. She mobilizes with a walker. She does admit that she had a fall a little over a week ago and landed on her buttocks, no injuries reported. When she does have to get up to use the bathroom at night, she usually does this herself with the use of her walker. Bedtime is around 9 PM and wake up time is around 9 AM. She feels that she would be able to stay for a sleep study by herself. She has 1 son and 4 daughters, she lives with her daughter who is here with her today, Glenda [name]. Daughter works third shift and when she is away at work another daughter comes and stays with the patient.  Her Past Medical History  Is Significant For: Past Medical History:  Diagnosis Date  . Arthritis   . Breast cancer ([place])   . CAD (coronary artery disease)   . Chest pain   . Dizziness   . DM (diabetes mellitus) ([place])   . GERD (gastroesophageal reflux disease)   . HLD (hyperlipidemia)   . HTN (hypertension)   . Obesity   . Radiation 09/11/15-10/13/15   42.72 Gy to right breast, 12 Gy boost to right breast  . Shortness of breath dyspnea   . Stroke ([place])   . UTI (urinary tract infection)     Her Past Surgical History Is Significant For: Past Surgical History:  Procedure Laterality Date  . CHOLECYSTECTOMY    . colon polyps; hx    . CORONARY ANGIOPLASTY  02/14/09  . ESOPHAGOGASTRODUODENOSCOPY (EGD) WITH PROPOFOL Left 09/08/2015   Procedure: ESOPHAGOGASTRODUODENOSCOPY (EGD) WITH PROPOFOL;  Surgeon: Clarene Essex, MD;  Location: [facility] ENDOSCOPY;  Service: Endoscopy;  Laterality: Left;  . RADIOACTIVE SEED GUIDED MASTECTOMY WITH AXILLARY SENTINEL LYMPH NODE BIOPSY Right 07/17/2015   Procedure: RADIOACTIVE SEED GUIDED PARTIAL MASTECTOMY WITH AXILLARY SENTINEL LYMPH NODE BIOPSY;  Surgeon: Erroll Luna, MD;  Location: [place];  Service: General;  Laterality: Right;    Her Family History Is Significant For: Family History  Problem Relation [name] of Onset  . Diabetes Mother   . Colon cancer Father     also had lung  . Diabetes    . Heart attack    . Diabetes Sister  TWIN  . Breast cancer Sister     Her Social History Is Significant For: Social History   Social History  . Marital status: Widowed    Spouse name: N/A  . Number of children: 5  . Years of education: 12   Occupational History  . Retired    Social History Main Topics  . Smoking status: Never Smoker  . Smokeless tobacco: Never Used  . Alcohol use No  . Drug use: No  . Sexual activity: Not Currently   Other Topics Concern  . None   Social History Narrative   Retired.    Lives with daughter   Caffeine use: Drinks soda and coffee and tea  daily    Her Allergies Are:  No Known Allergies:   Her Current Medications Are:  Outpatient Encounter Prescriptions as of 12/23/2015  Medication Sig  . amLODipine (NORVASC) 10 MG tablet Take 10 mg by mouth daily. Reported on 10/31/2015  . aspirin 81 MG chewable tablet Chew 81 mg by mouth daily.  . clonazePAM (KLONOPIN) 0.5 MG tablet Take 0.5 tablets (0.25 mg total) by mouth 3 (three) times daily as needed for anxiety.  . cloNIDine (CATAPRES) 0.2 MG tablet Take 0.2 mg by mouth 2 (two) times daily as needed. BP > 160  . clopidogrel (PLAVIX) 75 MG tablet Take 75 mg by mouth daily.   . furosemide (LASIX) 80 MG tablet Take 80 mg by mouth daily as needed.   Marland Kitchen HUMALOG KWIKPEN 100 UNIT/ML KiwkPen Inject 22-34 Units into the skin 2 (two) times daily. Sliding scale as follows: 80-199 = 22 units 200-299 = 26 units 300-399 = 30 units >400 = 34 units  . hyaluronate sodium (RADIAPLEXRX) GEL Apply 1 application topically once. Reported on 10/31/2015  . nebivolol (BYSTOLIC) 10 MG tablet Take 20 mg by mouth daily.  . pantoprazole (PROTONIX) 40 MG tablet Take 40 mg by mouth 2 (two) times daily.  . potassium chloride (KLOR-CON) 10 MEQ CR tablet Take 10 mEq by mouth 2 (two) times daily. When taking lasix  . pravastatin (PRAVACHOL) 80 MG tablet Take 80 mg by mouth daily.  Nelva Nay SOLOSTAR 300 UNIT/ML SOPN Inject 24 Units into the skin every morning.   . valsartan-hydrochlorothiazide (DIOVAN-HCT) 320-25 MG tablet Take 1 tablet by mouth daily.   No facility-administered encounter medications on file as of 12/23/2015.   :  Review of Systems:  Out of a complete 14 point review of systems, all are reviewed and negative with the exception of these symptoms as listed below: Review of Systems  Neurological:       Sometimes has trouble staying asleep, snoring, takes naps during the day.   Epworth Sleepiness Scale 0= would never doze 1= slight chance of dozing 2= moderate chance of dozing 3= high chance of  dozing  Sitting and reading:3 Watching TV:3 Sitting inactive in a public place (ex. Theater or meeting):0 As a passenger in a car for an hour without a break:0 Lying down to rest in the afternoon:3 Sitting and talking to someone:0 Sitting quietly after lunch (no alcohol):0 In a car, while stopped in traffic:0 Total:9   Objective:  Neurologic Exam  Physical Exam Physical Examination:   Vitals:   12/23/15 1431  BP: (!) 154/62  Pulse: 72  Resp: 16    General Examination: The patient is a very pleasant 82 y.o. female in no acute distress. She appears well-developed and well-nourished and well groomed.   HEENT: Normocephalic, atraumatic, pupils are equal, round and  reactive to light and accommodation. Funduscopic exam is normal with sharp disc margins noted. Extraocular tracking is good without limitation to gaze excursion or nystagmus noted. Normal smooth pursuit is noted. Hearing is mildly impaired. Face is fairly symmetric with normal facial animation and normal facial sensation. Speech is dysarthric noted. There is no hypophonia. There is no lip, neck/head, jaw or voice tremor. Neck is supple with full range of passive and active motion. There are no carotid bruits on auscultation. Oropharynx exam reveals: mild mouth dryness, adequate dental hygiene with full dentures on top, and moderate airway crowding, due to thicker soft palate, redundant soft palate and larger appearing uvula. Tonsils are absent. Mallampati is class III. Tongue protrudes centrally and palate elevates symmetrically. Neck size is 15 inches.  Chest: Clear to auscultation without wheezing, rhonchi or crackles noted.  Heart: S1+S2+0, regular and normal without murmurs, rubs or gallops noted.   Abdomen: Soft, non-tender and non-distended with normal bowel sounds appreciated on auscultation.  Extremities: There is 1+ pitting edema in the distal lower extremities bilaterally. Pedal pulses are intact.  Skin: Warm and  dry without trophic changes noted. There are no varicose veins.  Musculoskeletal: exam reveals no obvious joint deformities, tenderness or joint swelling or erythema.   Neurologically:  Mental status: The patient is awake, alert and oriented in all 4 spheres. Her immediate and remote memory, attention, language skills and fund of knowledge are appropriate. There is no evidence of aphasia, agnosia, apraxia or anomia. Speech is clear with normal prosody and enunciation. Thought process is linear. Mood is normal and affect is normal.  Cranial nerves II - XII are as described above under HEENT exam. In addition: shoulder shrug is normal with equal shoulder height noted. Motor exam: Normal bulk, strength and tone is noted. There is no drift, tremor or rebound. Reflexes are 1+ throughout, trace in the ankles, 2+ in the right upper extremity. Fine motor skills and coordination: Impaired on the right side. She has mild dysmetria in the right upper extremity. She has no significant intention tremor. Sensory exam is intact to light touch throughout. She did not bring her walker, I did not ask her to stand or walk for me.   Assessment and Plan:  In summary, Armentha Greening is a very pleasant 82 y.o.-year old female with an underlying medical history of arthritis, breast cancer, status post right mastectomy in March 2017, and radiation therapy in May and June 2017, coronary artery disease, status post angioplasty in 2010, type 2 diabetes, reflux disease, hyperlipidemia, hypertension, overweight state, and ischemic stroke of the right cerebellar peduncle in April 2017, whose history and physical exam are  concerning for obstructive sleep apnea (OSA). I had a long chat with the patient and Glenda [name] about my findings and the diagnosis of OSA, its prognosis and treatment options. We talked about medical treatments, surgical interventions and non-pharmacological approaches. I explained in particular the risks and  ramifications of untreated moderate to severe OSA, especially with respect to developing cardiovascular disease down the Road, including congestive heart failure, difficult to treat hypertension, cardiac arrhythmias, or stroke. Even type 2 diabetes has, in part, been linked to untreated OSA. Symptoms of untreated OSA include daytime sleepiness, memory problems, mood irritability and mood disorder such as depression and anxiety, lack of energy, as well as recurrent headaches, especially morning headaches. We talked about trying to maintain a healthy lifestyle in general, as well as the importance of weight control. I encouraged the patient to eat healthy, exercise  daily and keep well hydrated, to keep a scheduled bedtime and wake time routine, to not skip any meals and eat healthy snacks in between meals. I advised the patient not to drive when feeling sleepy. I recommended the following at this time: sleep study with potential positive airway pressure titration. (We will score hypopneas at 4% and split the sleep study into diagnostic and treatment portion, if the estimated. 2 hour AHI is >20/h).   We will probably do one-on-one sleep study with her given her right-sided weakness, she may need additional assistance at night and may need one of her family members to stay with her.  I explained the sleep test procedure to the patient and also outlined possible surgical and non-surgical treatment options of OSA, including the use of a custom-made dental device (which would require a referral to a specialist dentist or oral surgeon), upper airway surgical options, such as pillar implants, radiofrequency surgery, tongue base surgery, and UPPP (which would involve a referral to an ENT surgeon). Rarely, jaw surgery such as mandibular advancement may be considered.  I also explained the CPAP treatment option to the patient, who indicated that she would be willing to try CPAP if the need arises. I explained the  importance of being compliant with PAP treatment, not only for insurance purposes but primarily to improve Her symptoms, and for the patient's long term health benefit, including to reduce Her cardiovascular risks. I answered all their questions today and the patient and her daughter were in agreement. I would like to see her back after the sleep study is completed and encouraged her to call with any interim questions, concerns, problems or updates.   Thank you very much for allowing me to participate in the care of this nice patient. If I can be of any further assistance to you please do not hesitate to talk to me.  Sincerely,   Glenda Age, MD, PhD

## 2015-12-23 NOTE — Patient Instructions (Addendum)

## 2015-12-24 ENCOUNTER — Ambulatory Visit: Payer: Medicare Other

## 2015-12-24 NOTE — Therapy (Signed)
[facility] [address] [place], [place], [address] Phone: [phone]   Fax:  [phone]  Physical Therapy Treatment  Patient Details  Name: Glenda [name] MRN: [id number] Date of Birth: 10-14-1933 Referring Provider: Donnie Coffin, MD  Encounter Date: 12/23/2015      PT End of Session - 12/24/15 1119    Visit Number 16   Number of Visits 17   Date for PT Re-Evaluation 12/30/15   Authorization Type [employer] MCR- G code needed every 10th visit      Past Medical History:  Diagnosis Date  . Arthritis   . Breast cancer ([place])   . CAD (coronary artery disease)   . Chest pain   . Dizziness   . DM (diabetes mellitus) ([place])   . GERD (gastroesophageal reflux disease)   . HLD (hyperlipidemia)   . HTN (hypertension)   . Obesity   . Radiation 09/11/15-10/13/15   42.72 Gy to right breast, 12 Gy boost to right breast  . Shortness of breath dyspnea   . Stroke ([place])   . UTI (urinary tract infection)     Past Surgical History:  Procedure Laterality Date  . CHOLECYSTECTOMY    . colon polyps; hx    . CORONARY ANGIOPLASTY  02/14/09  . ESOPHAGOGASTRODUODENOSCOPY (EGD) WITH PROPOFOL Left 09/08/2015   Procedure: ESOPHAGOGASTRODUODENOSCOPY (EGD) WITH PROPOFOL;  Surgeon: Clarene Essex, MD;  Location: [facility] ENDOSCOPY;  Service: Endoscopy;  Laterality: Left;  . RADIOACTIVE SEED GUIDED MASTECTOMY WITH AXILLARY SENTINEL LYMPH NODE BIOPSY Right 07/17/2015   Procedure: RADIOACTIVE SEED GUIDED PARTIAL MASTECTOMY WITH AXILLARY SENTINEL LYMPH NODE BIOPSY;  Surgeon: Erroll Luna, MD;  Location: [place];  Service: General;  Laterality: Right;    There were no vitals filed for this visit.      Subjective Assessment - 12/23/15 1237    Subjective Pt reports no changes; walking at home with assistance                         [facility] Adult PT Treatment/Exercise - 12/24/15 0001      Ambulation/Gait   Ambulation/Gait Yes   Ambulation/Gait Assistance 4: Min guard   Ambulation/Gait Assistance Details cues to lift R foot   Ambulation Distance (Feet) 120 Feet  3 reps with seated rest period after 1 complete lap   Assistive device Rolling walker   Gait Pattern Step-through pattern;Decreased stride length;Decreased step length - right;Decreased step length - left;Decreased stance time - right;Decreased hip/knee flexion - right;Decreased weight shift to right;Shuffle;Ataxic;Trunk flexed;Narrow base of support;Poor foot clearance - right   Ambulation Surface Level;Indoor   Stairs Assistance 5: Supervision;4: Restaurant manager, fast food Details (indicate cue type and reason) cues for foot placement for safety with descension   Stair Management Technique Two rails   Number of Stairs 4   Height of Stairs 6     Timed Up and Go Test   TUG Normal TUG   Normal TUG (seconds) 47.47  with RW     TherEx; sit to stand 10 reps from high/low mat table (20" height) with RUE support - pt unable to perform without UE support    Tap ups to step with alternating feet with min to CGA with UE suppport prn - 5 reps each leg            PT Short Term Goals - 12/03/15 1107      PT SHORT TERM GOAL #1   Title Pt will  initiate HEP for strengthening and balance in order to improve functional mobility.  (Target Date: 11/28/15)   Baseline 12/03/15: met today after reinstruction with pictures to daughter/pt   Status Achieved     PT SHORT TERM GOAL #2   Title Pt will improve TUG score to <80 seconds in order to indicate decreased fall risk.     Baseline 11/25/15: 52.44 sec's with RW and simulated toe cap    Status Achieved     PT SHORT TERM GOAL #3   Title Will assess BERG and improve score by 4 points in order to indicate decreased fall risk and functional improvement in balance.     Baseline 11/27/15: 17/56 today (was 12/56 at baseline)   Status Achieved     PT SHORT TERM GOAL #4   Title Pt will ambulate over indoor surfaces x  150' w/ RW at S level in order to indicate safe negotiation in home.     Baseline 11/25/15: pt able to ambulate this distance with varied amout of assistance (min guard to min assist)   Status Partially Met     PT SHORT TERM GOAL #5   Title Pt will verbalize fall prevention strategies in order to indicate decreased fall risk inside and outside of home.     Baseline 12/03/15: fall prevention strategies issued today with review with pt and daughter   Status Partially Met     PT SHORT TERM GOAL #6   Title Pt will perform 8/10 sit <>stands with single UE support only at mod I level in order to indicate improved functional strength.     Baseline 12/03/15: pt continues to need min>mod assist to power up and to control descent with sitting down using 1-2 UE's to assist.   Status Not Met           PT Long Term Goals - 12/23/15 1238      PT LONG TERM GOAL #1   Title Pt will be independent with HEP in order to indicate improved functional mobility and decreased fall risk.  (Target Date: 12/26/15)   Baseline met per pt and daughter's report - reports doing them "occasionally"   Status Achieved     PT LONG TERM GOAL #2   Title Pt will improve TUG score to <40 secs in order to indicate decreased fall risk.     Baseline 47.47 secs with RW   Status Not Met     PT LONG TERM GOAL #3   Title Pt will improve BERG balance score 8 points from baseline in order to indicate decreased fall risk.       PT LONG TERM GOAL #4   Title Pt will perform 8/10 sit<>stand without UE support at mod I level in order to indicate improved functional strength.     Baseline pt performed 10 reps sit to stand with RUE support - unable to perform without UE   Status On-going     PT LONG TERM GOAL #5   Title Pt will ambulate 300' w/ LRAD over paved outdoor surfaces at mod I level in order to indicate safe return to community.       PT LONG TERM GOAL #6   Title --               Plan - 12/24/15 1119    Clinical  Impression Statement Pt met LTG# 1 and 2 ; TUG score improved by 7      Patient will benefit from skilled therapeutic  intervention in order to improve the following deficits and impairments:     Visit Diagnosis: No diagnosis found.     Problem List Patient Active Problem List   Diagnosis Date Noted  . Somnolence, daytime 12/08/2015  . Cerebral thrombosis with cerebral infarction 09/07/2015  . Lacunar infarct, acute ([place])   . Nausea & vomiting 09/05/2015  . Acute kidney injury ([place]) 09/04/2015  . Type II diabetes mellitus with neurological manifestations, uncontrolled ([place]) 09/03/2015  . Bilateral lower extremity edema 09/03/2015  . Essential hypertension   . Diabetes mellitus type 2 in nonobese ([facility])   . History of breast cancer   . AKI (acute kidney injury) ([place])   . Acute blood loss anemia   . Hemiparesis ([place])   . Gait disturbance, post-stroke   . Dysphagia, post-stroke   . Acute ischemic stroke/right cerebellar 08/24/2015  . Breast cancer of upper-outer quadrant of right female breast ([place]) 04/30/2015  . CVA (cerebral infarction) 07/05/2014  . Hyperlipidemia 07/05/2014  . Syncope 07/04/2014  . Mild mitral regurgitation by prior echocardiogram 12/20/2011  . Diabetes mellitus type 1, uncontrolled, insulin dependent ([place]) 02/26/2009  . OBESITY 02/26/2009  . Uncontrolled hypertension 02/26/2009  . CAD (coronary artery disease) 02/26/2009  . GASTROESOPHAGEAL REFLUX DISEASE 02/26/2009    Glenda [name], PT 12/24/2015, 11:21 AM  [facility] [address] [place], [place], [address] Phone: [phone]   Fax:  [phone]  Name: Glenda [name] MRN: [id number] Date of Birth: 1933-07-12

## 2015-12-25 ENCOUNTER — Ambulatory Visit: Payer: Medicare Other | Admitting: Speech Pathology

## 2015-12-25 ENCOUNTER — Encounter: Payer: Self-pay | Admitting: Rehabilitation

## 2015-12-25 ENCOUNTER — Ambulatory Visit: Payer: Medicare Other | Admitting: Rehabilitation

## 2015-12-25 ENCOUNTER — Ambulatory Visit: Payer: Medicare Other | Admitting: Occupational Therapy

## 2015-12-25 DIAGNOSIS — R2681 Unsteadiness on feet: Secondary | ICD-10-CM

## 2015-12-25 DIAGNOSIS — M6281 Muscle weakness (generalized): Secondary | ICD-10-CM | POA: Diagnosis not present

## 2015-12-25 DIAGNOSIS — G8190 Hemiplegia, unspecified affecting unspecified side: Secondary | ICD-10-CM

## 2015-12-25 DIAGNOSIS — R278 Other lack of coordination: Secondary | ICD-10-CM

## 2015-12-25 DIAGNOSIS — R2689 Other abnormalities of gait and mobility: Secondary | ICD-10-CM

## 2015-12-25 NOTE — Therapy (Signed)
[facility] [address] [place], [place], [address] Phone: [phone]   Fax:  [phone]  Physical Therapy Treatment  Patient Details  Name: Glenda [name] MRN: [id number] Date of Birth: [birth year] Referring Provider: Donnie Coffin, MD  Encounter Date: 12/25/2015      PT End of Session - 12/25/15 1324    Visit Number 17   Number of Visits 25   Date for PT Re-Evaluation 01/29/16   Authorization Type UHC MCR- G code needed every 10th visit   PT Start Time 1318   PT Stop Time 1400   PT Time Calculation (min) 42 min   Activity Tolerance Patient tolerated treatment well;Patient limited by fatigue   Behavior During Therapy [facility] for tasks assessed/performed      Past Medical History:  Diagnosis Date  . Arthritis   . Breast cancer ([place])   . CAD (coronary artery disease)   . Chest pain   . Dizziness   . DM (diabetes mellitus) ([place])   . GERD (gastroesophageal reflux disease)   . HLD (hyperlipidemia)   . HTN (hypertension)   . Obesity   . Radiation 09/11/15-10/13/15   42.72 Gy to right breast, 12 Gy boost to right breast  . Shortness of breath dyspnea   . Stroke ([place])   . UTI (urinary tract infection)     Past Surgical History:  Procedure Laterality Date  . CHOLECYSTECTOMY    . colon polyps; hx    . CORONARY ANGIOPLASTY  02/14/09  . ESOPHAGOGASTRODUODENOSCOPY (EGD) WITH PROPOFOL Left 09/08/2015   Procedure: ESOPHAGOGASTRODUODENOSCOPY (EGD) WITH PROPOFOL;  Surgeon: Clarene Essex, MD;  Location: [facility] ENDOSCOPY;  Service: Endoscopy;  Laterality: Left;  . RADIOACTIVE SEED GUIDED MASTECTOMY WITH AXILLARY SENTINEL LYMPH NODE BIOPSY Right 07/17/2015   Procedure: RADIOACTIVE SEED GUIDED PARTIAL MASTECTOMY WITH AXILLARY SENTINEL LYMPH NODE BIOPSY;  Surgeon: Erroll Luna, MD;  Location: [facility];  Service: General;  Laterality: Right;    There were no vitals filed for this visit.      Subjective Assessment - 12/25/15 1323     Subjective Reports no changes at home, no falls.    Patient is accompained by: Family member   Limitations Walking;House hold activities   Patient Stated Goals "I want to work on walking"    Currently in Pain? No/denies                         [facility] Adult PT Treatment/Exercise - 12/25/15 1334      Standardized Balance Assessment   Standardized Balance Assessment Berg Balance Test     Berg Balance Test   Sit to Stand Able to stand using hands after several tries   Standing Unsupported Needs several tries to stand 30 seconds unsupported   Sitting with Back Unsupported but Feet Supported on Floor or Stool Able to sit safely and securely 2 minutes   Stand to Sit Sits independently, has uncontrolled descent   Transfers Needs one person to assist   Standing Unsupported with Eyes Closed Unable to keep eyes closed 3 seconds but stays steady   Standing Ubsupported with Feet Together Needs help to attain position and unable to hold for 15 seconds   From Standing, Reach Forward with Outstretched Arm Loses balance while trying/requires external support   From Standing Position, Pick up Object from Floor Unable to try/needs assist to keep balance   From Standing Position, Turn to Look Behind Over each Shoulder Needs assist to keep  from losing balance and falling   Turn 360 Degrees Needs assistance while turning   Standing Unsupported, Alternately Place Feet on Step/Stool Needs assistance to keep from falling or unable to try   Standing Unsupported, One Foot in [employer] balance while stepping or standing   Standing on One Leg Unable to try or needs assist to prevent fall   Total Score 10      NMR:  Performed BERG balance test to address LTG.  Note decrease in score from baseline score of 12/56 to 10/56.  Note pt unable to stand statically without support during testing, however seems to be able to perform more automatic tasks like pulling up pants without support (per pt and  OT report).  Continue to educate on importance of use of RW and proper shoe wear at home and how she will need to wear brace at all times when ambulating at home.     Self Care:  Education on shoe wear at home, PT to contact [employer] to see if brace can be delivered sooner.    Gait:  Continue to address gait during session for improved quality and to increase endurance.  Donned foot up brace for RLE as R walk on AFO unavailable at time of session.  Note minimal improvement in foot clearance and requires up to min A to steady and mod cues to scan to the R to avoid obstacles.  Performed 115' gait with RW.  Requires increased time to complete task and long seated rest break following task.            PT Education - 12/25/15 1324    Education provided Yes   Education Details Pt continues to state that she walks in socks around house, continue to provide MAX education on safety and fall prevention at home.    Person(s) Educated Patient   Methods Explanation   Comprehension Verbalized understanding          PT Short Term Goals - 12/03/15 1107      PT SHORT TERM GOAL #1   Title Pt will initiate HEP for strengthening and balance in order to improve functional mobility.  (Target Date: 11/28/15)   Baseline 12/03/15: met today after reinstruction with pictures to daughter/pt   Status Achieved     PT SHORT TERM GOAL #2   Title Pt will improve TUG score to <80 seconds in order to indicate decreased fall risk.     Baseline 11/25/15: 52.44 sec's with RW and simulated toe cap    Status Achieved     PT SHORT TERM GOAL #3   Title Will assess BERG and improve score by 4 points in order to indicate decreased fall risk and functional improvement in balance.     Baseline 11/27/15: 17/56 today (was 12/56 at baseline)   Status Achieved     PT SHORT TERM GOAL #4   Title Pt will ambulate over indoor surfaces x 150' w/ RW at S level in order to indicate safe negotiation in home.     Baseline 11/25/15: pt able to  ambulate this distance with varied amout of assistance (min guard to min assist)   Status Partially Met     PT SHORT TERM GOAL #5   Title Pt will verbalize fall prevention strategies in order to indicate decreased fall risk inside and outside of home.     Baseline 12/03/15: fall prevention strategies issued today with review with pt and daughter   Status Partially Met  PT SHORT TERM GOAL #6   Title Pt will perform 8/10 sit <>stands with single UE support only at mod I level in order to indicate improved functional strength.     Baseline 12/03/15: pt continues to need min>mod assist to power up and to control descent with sitting down using 1-2 UE's to assist.   Status Not Met           PT Long Term Goals - 12/25/15 1349      PT LONG TERM GOAL #1   Title Pt will be independent with HEP in order to indicate improved functional mobility and decreased fall risk.  (Target Date: 12/26/15)   Baseline met per pt and daughter's report - reports doing them "occasionally"   Status Achieved     PT LONG TERM GOAL #2   Title Pt will improve TUG score to <40 secs in order to indicate decreased fall risk.     Baseline 47.47 secs with RW-made on-going to continue to work towards goal.    Status On-going     PT LONG TERM GOAL #3   Title Pt will improve BERG balance score 8 points from baseline in order to indicate decreased fall risk.     Baseline 10/56 on 12/25/15 -will make ongoing on next visit   Status Not Met     PT LONG TERM GOAL #4   Title Pt will perform 8/10 sit<>stand without UE support at mod I level in order to indicate improved functional strength.     Baseline pt performed 10 reps sit to stand with RUE support - unable to perform without UE   Status On-going     PT LONG TERM GOAL #5   Title Pt will ambulate 300' w/ LRAD over paved outdoor surfaces at mod I level in order to indicate safe return to community.     Baseline Pt only able to ambulate 115' over indoor surfaces with  min/guard to min A, therefore unsafe to address outdoor gait at this time.     Status On-going     PT LONG TERM GOAL #6   Title Pt will negotiate up/down 5 steps w/ single rail at mod I level in order to indicate safe entry into home/family's homes.     Baseline supervision to CGA needed for safety - 12-23-15   Status On-going               Plan - 12/25/15 1325    Clinical Impression Statement Skilled session continued to address LTGs. She did not meet BERG balance score as score actually decreased from 12 to 10/56.  Have continued to note marked increased in deficits since new CVA approx 3 weeks ago.  She is also unable to meet gait goals as she requires up to min A during gait indoors, therefore deferred outdoor ambulation assessment today and will make on going through next POC.     PT Frequency 2x / week   PT Duration 4 weeks   PT Treatment/Interventions ADLs/Self Care Home Management;Electrical Stimulation;DME Instruction;Gait training;Stair training;Functional mobility training;Therapeutic activities;Therapeutic exercise;Balance training;Neuromuscular re-education;Patient/family education;Orthotic Fit/Training;Vestibular;Visual/perceptual remediation/compensation   PT Next Visit Plan finish checking LTG's, complete recert per primary PT   Consulted and Agree with Plan of Care Patient;Family member/caregiver   Family Member Consulted daughter,       Patient will benefit from skilled therapeutic intervention in order to improve the following deficits and impairments:  Abnormal gait, Decreased activity tolerance, Decreased balance, Decreased coordination, Decreased endurance, Decreased knowledge  of use of DME, Decreased mobility, Decreased strength, Dizziness, Increased edema, Impaired perceived functional ability, Impaired flexibility, Impaired UE functional use, Impaired vision/preception, Improper body mechanics, Postural dysfunction  Visit Diagnosis: Hemiparesis ([place]) - Plan: PT  plan of care cert/re-cert  Unsteadiness on feet - Plan: PT plan of care cert/re-cert  Other abnormalities of gait and mobility - Plan: PT plan of care cert/re-cert  Other lack of coordination - Plan: PT plan of care cert/re-cert     Problem List Patient Active Problem List   Diagnosis Date Noted  . Somnolence, daytime 12/08/2015  . Cerebral thrombosis with cerebral infarction 09/07/2015  . Lacunar infarct, acute ([place])   . Nausea & vomiting 09/05/2015  . Acute kidney injury ([place]) 09/04/2015  . Type II diabetes mellitus with neurological manifestations, uncontrolled ([place]) 09/03/2015  . Bilateral lower extremity edema 09/03/2015  . Essential hypertension   . Diabetes mellitus type 2 in nonobese ([facility])   . History of breast cancer   . AKI (acute kidney injury) ([place])   . Acute blood loss anemia   . Hemiparesis ([place])   . Gait disturbance, post-stroke   . Dysphagia, post-stroke   . Acute ischemic stroke/right cerebellar 08/24/2015  . Breast cancer of upper-outer quadrant of right female breast ([place]) 04/30/2015  . CVA (cerebral infarction) 07/05/2014  . Hyperlipidemia 07/05/2014  . Syncope 07/04/2014  . Mild mitral regurgitation by prior echocardiogram 12/20/2011  . Diabetes mellitus type 1, uncontrolled, insulin dependent ([place]) 02/26/2009  . OBESITY 02/26/2009  . Uncontrolled hypertension 02/26/2009  . CAD (coronary artery disease) 02/26/2009  . GASTROESOPHAGEAL REFLUX DISEASE 02/26/2009    Cameron Sprang, PT, MPT [facility] [address] [place], [place], [address] Phone: [phone]   Fax:  [phone] 12/25/15, 8:08 PM  Name: Glenda [name] MRN: [id number] Date of Birth: May 04, 1933

## 2015-12-25 NOTE — Therapy (Signed)
[facility] [address] [place], [place], [address] Phone: [phone]   Fax:  [phone]  Occupational Therapy Treatment  Patient Details  Name: Glenda [name] MRN: [id number] Date of Birth: [birth year] Referring Provider: Dr. Donnie Coffin  Encounter Date: 12/25/2015      OT End of Session - 12/25/15 1632    Visit Number 14   Number of Visits 17   Date for OT Re-Evaluation 01/04/16   Authorization Type UHC MCR   Authorization - Visit Number 14   Authorization - Number of Visits 20   Equipment Utilized During Treatment tub bench   Activity Tolerance Patient tolerated treatment well      Past Medical History:  Diagnosis Date  . Arthritis   . Breast cancer ([place])   . CAD (coronary artery disease)   . Chest pain   . Dizziness   . DM (diabetes mellitus) ([place])   . GERD (gastroesophageal reflux disease)   . HLD (hyperlipidemia)   . HTN (hypertension)   . Obesity   . Radiation 09/11/15-10/13/15   42.72 Gy to right breast, 12 Gy boost to right breast  . Shortness of breath dyspnea   . Stroke ([place])   . UTI (urinary tract infection)     Past Surgical History:  Procedure Laterality Date  . CHOLECYSTECTOMY    . colon polyps; hx    . CORONARY ANGIOPLASTY  02/14/09  . ESOPHAGOGASTRODUODENOSCOPY (EGD) WITH PROPOFOL Left 09/08/2015   Procedure: ESOPHAGOGASTRODUODENOSCOPY (EGD) WITH PROPOFOL;  Surgeon: Clarene Essex, MD;  Location: [facility] ENDOSCOPY;  Service: Endoscopy;  Laterality: Left;  . RADIOACTIVE SEED GUIDED MASTECTOMY WITH AXILLARY SENTINEL LYMPH NODE BIOPSY Right 07/17/2015   Procedure: RADIOACTIVE SEED GUIDED PARTIAL MASTECTOMY WITH AXILLARY SENTINEL LYMPH NODE BIOPSY;  Surgeon: Erroll Luna, MD;  Location: [facility];  Service: General;  Laterality: Right;    There were no vitals filed for this visit.      Subjective Assessment - 12/25/15 1414    Pertinent History (P)  CVA 08/24/15   Patient Stated Goals (P)  To  do everything I did before   Currently in Pain? (P)  No/denies                      OT Treatments/Exercises (OP) - 12/25/15 0001      ADLs   LB Dressing Pt practiced donning/doffing shoes and socks mod I level with extra time and difficulty tying shoes. Pt reports she usually doesn't tie shoes, but wears slip ons or have family member do it. Pt encouraged to practice at home tying shoes.  Simulated donning/doffing pants from sit-stand (using theraband) for standing balance and safety with clothes management after toileting.    Bathing Simulated tub transfer using tub bench (secondary to overall decreased balance since recent episode - ? TIA). Pt/daughter educated in proper set up, how to cut shower liner to keep water in tub, use of non slip rug and how/where to purchase. Pt/daughter provided handout of tub bench. Pt/family also instructed this was safest method, however if pt continues to use all in shower chair, she would need grab bar installed for safety stepping in/out of shower. Daughter agreed with recommendations                  OT Short Term Goals - 12/25/15 1636      OT SHORT TERM GOAL #1   Title I with inital HEP.   Time 4   Period Weeks  Status Achieved     OT SHORT TERM GOAL #2   Title Pt will demonstrate ability to retrieve a lightweight object at 110* with RUE.   Time 4   Period Weeks   Status Achieved  grossly 100*     OT SHORT TERM GOAL #3   Title Pt will demonstrate improved fine motor coordination as evidenced by decreasing 9 hole peg test score by 4 secs bilaterally.   Baseline RUE 59 secs, LUE 42.40 secs   Time 4   Period Weeks   Status Partially Met  56.07 for RUE, 32.56 for LUE     OT SHORT TERM GOAL #4   Title Pt will perform simple snack prep or light home management task in standing with min A without LOB demonstrating good safety awareness.   Time 4   Period Weeks   Status Achieved  in clinic. However, has declined in  balance since recent TIA     OT SHORT TERM GOAL #5   Title Pt will demonstrate ability to write a short paragraph with 95% legibility.   Time 4   Period Weeks   Status Not Met     OT SHORT TERM GOAL #6   Title Further assess cognition and set goal PRN   Time 4   Period Weeks   Status Deferred  Pt to receive ST in the future will defer           OT Long Term Goals - 12/25/15 1635      OT LONG TERM GOAL #1   Title I with updated HEP.   Time 8   Period Weeks   Status New     OT LONG TERM GOAL #2   Title Pt will perform simple cooking task in standing with supervision demonstrating good safety awareness.   Time 8   Period Weeks   Status New     OT LONG TERM GOAL #3   Title Pt will demonstrate ability to retrieve a light weight object at 120 shoulder flexion with RUE   Time 8   Period Weeks   Status New     OT LONG TERM GOAL #4   Title Pt will resume use of RUE as dominant hand at least 75% of the time for ADLs/IADLs.   Time 8   Period Weeks   Status On-going     OT LONG TERM GOAL #5   Title Pt will decrease RUE 9 hole peg test score to 49 secs from for increased fine motor coordination.   Baseline RUE 59 secs   Time 8     OT LONG TERM GOAL #6   Title Pt will be modified indpendent with all basic ADLs.   Time 8   Period Weeks   Status Achieved  except supervision required for shower/tub transfers               Plan - 12/25/15 1633    Clinical Impression Statement Pt with decreased overall balance, however pt remains fairly independent with BADLS.    Rehab Potential Good   OT Frequency 2x / week   OT Duration 8 weeks   OT Treatment/Interventions Self-care/ADL training;Therapeutic exercise;Patient/family education;Functional Mobility Training;Neuromuscular education;Manual Therapy;Therapeutic exercises;DME and/or AE instruction;Therapeutic activities;Electrical Stimulation;Fluidtherapy;Cognitive remediation/compensation;Moist Heat;Passive range of  motion;Visual/perceptual remediation/compensation   Plan simple meal prep in standing with CGA prn and use of gait belt   Consulted and Agree with Plan of Care Patient;Family member/caregiver   Family Member Consulted daughter  Patient will benefit from skilled therapeutic intervention in order to improve the following deficits and impairments:  Abnormal gait, Decreased coordination, Decreased range of motion, Difficulty walking, Decreased safety awareness, Decreased endurance, Decreased activity tolerance, Decreased knowledge of precautions, Impaired UE functional use, Decreased knowledge of use of DME, Decreased balance, Decreased cognition, Decreased mobility, Decreased strength, Impaired perceived functional ability  Visit Diagnosis: Hemiparesis ([place])  Unsteadiness on feet    Problem List Patient Active Problem List   Diagnosis Date Noted  . Somnolence, daytime 12/08/2015  . Cerebral thrombosis with cerebral infarction 09/07/2015  . Lacunar infarct, acute ([place])   . Nausea & vomiting 09/05/2015  . Acute kidney injury ([place]) 09/04/2015  . Type II diabetes mellitus with neurological manifestations, uncontrolled ([place]) 09/03/2015  . Bilateral lower extremity edema 09/03/2015  . Essential hypertension   . Diabetes mellitus type 2 in nonobese ([facility])   . History of breast cancer   . AKI (acute kidney injury) ([place])   . Acute blood loss anemia   . Hemiparesis ([place])   . Gait disturbance, post-stroke   . Dysphagia, post-stroke   . Acute ischemic stroke/right cerebellar 08/24/2015  . Breast cancer of upper-outer quadrant of right female breast ([place]) 04/30/2015  . CVA (cerebral infarction) 07/05/2014  . Hyperlipidemia 07/05/2014  . Syncope 07/04/2014  . Mild mitral regurgitation by prior echocardiogram 12/20/2011  . Diabetes mellitus type 1, uncontrolled, insulin dependent ([place]) 02/26/2009  . OBESITY 02/26/2009  . Uncontrolled hypertension 02/26/2009  . CAD (coronary artery  disease) 02/26/2009  . GASTROESOPHAGEAL REFLUX DISEASE 02/26/2009    Carey Bullocks, OTR/L 12/25/2015, 4:38 PM  [facility] [address] [place], [place], [address] Phone: [phone]   Fax:  [phone]  Name: Glenda [name] MRN: [id number] Date of Birth: 01-21-1934

## 2015-12-30 ENCOUNTER — Ambulatory Visit: Payer: Medicare Other | Admitting: *Deleted

## 2015-12-31 ENCOUNTER — Encounter: Payer: Self-pay | Admitting: Physical Therapy

## 2015-12-31 ENCOUNTER — Ambulatory Visit: Payer: Medicare Other | Attending: Family Medicine | Admitting: Occupational Therapy

## 2015-12-31 ENCOUNTER — Ambulatory Visit: Payer: Medicare Other | Admitting: Physical Therapy

## 2015-12-31 DIAGNOSIS — R2689 Other abnormalities of gait and mobility: Secondary | ICD-10-CM

## 2015-12-31 DIAGNOSIS — G8190 Hemiplegia, unspecified affecting unspecified side: Secondary | ICD-10-CM | POA: Diagnosis present

## 2015-12-31 DIAGNOSIS — R41842 Visuospatial deficit: Secondary | ICD-10-CM | POA: Insufficient documentation

## 2015-12-31 DIAGNOSIS — R278 Other lack of coordination: Secondary | ICD-10-CM | POA: Insufficient documentation

## 2015-12-31 DIAGNOSIS — R471 Dysarthria and anarthria: Secondary | ICD-10-CM | POA: Insufficient documentation

## 2015-12-31 DIAGNOSIS — M6281 Muscle weakness (generalized): Secondary | ICD-10-CM

## 2015-12-31 DIAGNOSIS — R2681 Unsteadiness on feet: Secondary | ICD-10-CM | POA: Diagnosis present

## 2015-12-31 DIAGNOSIS — I639 Cerebral infarction, unspecified: Secondary | ICD-10-CM | POA: Diagnosis present

## 2015-12-31 NOTE — Therapy (Addendum)
[name] [address] [place], [place], [address] Phone: [phone]   Fax:  [phone]  Occupational Therapy Treatment  Patient Details  Name: Glenda [name] MRN: [id number] Date of Birth: 01-Aug-1933 Referring Provider: Dr. Donnie Coffin  Encounter Date: 12/31/2015      OT End of Session - 12/31/15 1608    Visit Number 15   Number of Visits 17   Date for OT Re-Evaluation 01/04/16   Authorization Type UHC MCR   Authorization Time Period G code next visit!   Authorization - Visit Number 15   Authorization - Number of Visits 20   OT Start Time 0017   OT Stop Time 1615   OT Time Calculation (min) 42 min      Past Medical History:  Diagnosis Date  . Arthritis   . Breast cancer ([place])   . CAD (coronary artery disease)   . Chest pain   . Dizziness   . DM (diabetes mellitus) ([place])   . GERD (gastroesophageal reflux disease)   . HLD (hyperlipidemia)   . HTN (hypertension)   . Obesity   . Radiation 09/11/15-10/13/15   42.72 Gy to right breast, 12 Gy boost to right breast  . Shortness of breath dyspnea   . Stroke ([place])   . UTI (urinary tract infection)     Past Surgical History:  Procedure Laterality Date  . CHOLECYSTECTOMY    . colon polyps; hx    . CORONARY ANGIOPLASTY  02/14/09  . ESOPHAGOGASTRODUODENOSCOPY (EGD) WITH PROPOFOL Left 09/08/2015   Procedure: ESOPHAGOGASTRODUODENOSCOPY (EGD) WITH PROPOFOL;  Surgeon: Clarene Essex, MD;  Location: [facility] ENDOSCOPY;  Service: Endoscopy;  Laterality: Left;  . RADIOACTIVE SEED GUIDED MASTECTOMY WITH AXILLARY SENTINEL LYMPH NODE BIOPSY Right 07/17/2015   Procedure: RADIOACTIVE SEED GUIDED PARTIAL MASTECTOMY WITH AXILLARY SENTINEL LYMPH NODE BIOPSY;  Surgeon: Erroll Luna, MD;  Location: [facility];  Service: General;  Laterality: Right;    There were no vitals filed for this visit.      Subjective Assessment - 12/31/15 1554    Subjective  Pt denies pain,    Pertinent  History CVA 08/24/15   Patient Stated Goals To do everything I did before   Currently in Pain? No/denies          Treatment: Dynamic functional reaching in standing to right and left sides with stepping and lateral weight shift to retrieve graded clothespins with each UE, minguard for balance, several rest breaks due to fatigue. Seated placing grooved pegs in pegboard with right then LUE for increased fine motor coordination, min difficulty with left, mod with right, removing pegs with in hand manipulation, min v.c to avoid compensations. Arm bike x 5 mins level 1 for conditioning. Pt received her AFO.                      OT Short Term Goals - 12/25/15 1636      OT SHORT TERM GOAL #1   Title I with inital HEP.   Time 4   Period Weeks   Status Achieved     OT SHORT TERM GOAL #2   Title Pt will demonstrate ability to retrieve a lightweight object at 110* with RUE.   Time 4   Period Weeks   Status Achieved  grossly 100*     OT SHORT TERM GOAL #3   Title Pt will demonstrate improved fine motor coordination as evidenced by decreasing 9 hole peg test score by 4 secs  bilaterally.   Baseline RUE 59 secs, LUE 42.40 secs   Time 4   Period Weeks   Status Partially Met  56.07 for RUE, 32.56 for LUE     OT SHORT TERM GOAL #4   Title Pt will perform simple snack prep or light home management task in standing with min A without LOB demonstrating good safety awareness.   Time 4   Period Weeks   Status Achieved  in clinic. However, has declined in balance since recent TIA     OT SHORT TERM GOAL #5   Title Pt will demonstrate ability to write a short paragraph with 95% legibility.   Time 4   Period Weeks   Status Not Met     OT SHORT TERM GOAL #6   Title Further assess cognition and set goal PRN   Time 4   Period Weeks   Status Deferred  Pt to receive ST in the future will defer           OT Long Term Goals - 12/25/15 1635      OT LONG TERM GOAL #1    Title I with updated HEP.   Time 8   Period Weeks   Status New     OT LONG TERM GOAL #2   Title Pt will perform simple cooking task in standing with supervision demonstrating good safety awareness.   Time 8   Period Weeks   Status New     OT LONG TERM GOAL #3   Title Pt will demonstrate ability to retrieve a light weight object at 120 shoulder flexion with RUE   Time 8   Period Weeks   Status New     OT LONG TERM GOAL #4   Title Pt will resume use of RUE as dominant hand at least 75% of the time for ADLs/IADLs.   Time 8   Period Weeks   Status On-going     OT LONG TERM GOAL #5   Title Pt will decrease RUE 9 hole peg test score to 49 secs from for increased fine motor coordination.   Baseline RUE 59 secs   Time 8     OT LONG TERM GOAL #6   Title Pt will be modified indpendent with all basic ADLs.   Time 8   Period Weeks   Status Achieved  except supervision required for shower/tub transfers               Plan - 12/31/15 1601    Clinical Impression Statement Pt is progressing towards goals for balance and UE functional use.   Rehab Potential Good   OT Frequency 2x / week   OT Duration 8 weeks   OT Treatment/Interventions Self-care/ADL training;Therapeutic exercise;Patient/family education;Functional Mobility Training;Neuromuscular education;Manual Therapy;Therapeutic exercises;DME and/or AE instruction;Therapeutic activities;Electrical Stimulation;Fluidtherapy;Cognitive remediation/compensation;Moist Heat;Passive range of motion;Visual/perceptual remediation/compensation   Plan check goals and renew next visit, schedule additional visits as pt is only scheduled though end of the month   Consulted and Agree with Plan of Care Patient   Family Member Consulted daughter      Patient will benefit from skilled therapeutic intervention in order to improve the following deficits and impairments:  Abnormal gait, Decreased coordination, Decreased range of motion,  Difficulty walking, Decreased safety awareness, Decreased endurance, Decreased activity tolerance, Decreased knowledge of precautions, Impaired UE functional use, Decreased knowledge of use of DME, Decreased balance, Decreased cognition, Decreased mobility, Decreased strength, Impaired perceived functional ability  Visit Diagnosis: Other lack of coordination  Muscle weakness (generalized)  Other abnormalities of gait and mobility  Unsteadiness on feet    Problem List Patient Active Problem List   Diagnosis Date Noted  . Somnolence, daytime 12/08/2015  . Cerebral thrombosis with cerebral infarction 09/07/2015  . Lacunar infarct, acute ([place])   . Nausea & vomiting 09/05/2015  . Acute kidney injury ([place]) 09/04/2015  . Type II diabetes mellitus with neurological manifestations, uncontrolled ([place]) 09/03/2015  . Bilateral lower extremity edema 09/03/2015  . Essential hypertension   . Diabetes mellitus type 2 in nonobese (HCC)   . History of breast cancer   . AKI (acute kidney injury) ([place])   . Acute blood loss anemia   . Hemiparesis ([place])   . Gait disturbance, post-stroke   . Dysphagia, post-stroke   . Acute ischemic stroke/right cerebellar 08/24/2015  . Breast cancer of upper-outer quadrant of right female breast ([place]) 04/30/2015  . CVA (cerebral infarction) 07/05/2014  . Hyperlipidemia 07/05/2014  . Syncope 07/04/2014  . Mild mitral regurgitation by prior echocardiogram 12/20/2011  . Diabetes mellitus type 1, uncontrolled, insulin dependent ([place]) 02/26/2009  . OBESITY 02/26/2009  . Uncontrolled hypertension 02/26/2009  . CAD (coronary artery disease) 02/26/2009  . GASTROESOPHAGEAL REFLUX DISEASE 02/26/2009    Sandrea Boer 12/31/2015, 4:40 PM Theone Murdoch, OTR/L [phone] Phone: [phone] 4:40 PM 12/31/15 [facility] [address] [place], [place], [address] Phone: [phone]   Fax:   [phone]  Name: Ski Polich MRN: [id number] Date of Birth: [birth year]

## 2015-12-31 NOTE — Therapy (Signed)
[facility] [address] [place], [place], [address] Phone: [phone]   Fax:  [phone]  Physical Therapy Treatment  Patient Details  Name: Glenda [name] MRN: [id number] Date of Birth: [birth year] Referring Provider: Donnie Coffin, MD  Encounter Date: 12/31/2015      PT End of Session - 12/31/15 1545    Visit Number 18   Number of Visits 25   Date for PT Re-Evaluation 01/29/16   Authorization Type UHC MCR- G code needed every 10th visit   PT Start Time 1505  pt was late for appt   PT Stop Time 1530   PT Time Calculation (min) 25 min   Equipment Utilized During Treatment Gait belt   Activity Tolerance Patient tolerated treatment well;Patient limited by fatigue   Behavior During Therapy [facility] for tasks assessed/performed      Past Medical History:  Diagnosis Date  . Arthritis   . Breast cancer ([place])   . CAD (coronary artery disease)   . Chest pain   . Dizziness   . DM (diabetes mellitus) ([place])   . GERD (gastroesophageal reflux disease)   . HLD (hyperlipidemia)   . HTN (hypertension)   . Obesity   . Radiation 09/11/15-10/13/15   42.72 Gy to right breast, 12 Gy boost to right breast  . Shortness of breath dyspnea   . Stroke ([place])   . UTI (urinary tract infection)     Past Surgical History:  Procedure Laterality Date  . CHOLECYSTECTOMY    . colon polyps; hx    . CORONARY ANGIOPLASTY  02/14/09  . ESOPHAGOGASTRODUODENOSCOPY (EGD) WITH PROPOFOL Left 09/08/2015   Procedure: ESOPHAGOGASTRODUODENOSCOPY (EGD) WITH PROPOFOL;  Surgeon: Clarene Essex, MD;  Location: [facility] ENDOSCOPY;  Service: Endoscopy;  Laterality: Left;  . RADIOACTIVE SEED GUIDED MASTECTOMY WITH AXILLARY SENTINEL LYMPH NODE BIOPSY Right 07/17/2015   Procedure: RADIOACTIVE SEED GUIDED PARTIAL MASTECTOMY WITH AXILLARY SENTINEL LYMPH NODE BIOPSY;  Surgeon: Erroll Luna, MD;  Location: [facility];  Service: General;  Laterality: Right;    There were no  vitals filed for this visit.      Subjective Assessment - 12/31/15 1509    Subjective No new complaints. No falls or pain to report.    Patient is accompained by: Family member   Limitations Walking;House hold activities   Patient Stated Goals "I want to work on walking"            [facility] Adult PT Treatment/Exercise - 12/31/15 1521      Transfers   Transfers Sit to Stand;Stand to Sit   Sit to Stand 4: Min guard;5: Supervision;With upper extremity assist;With armrests;From chair/3-in-1   Sit to Stand Details Verbal cues for sequencing;Verbal cues for technique;Verbal cues for precautions/safety;Tactile cues for weight shifting   Sit to Stand Details (indicate cue type and reason) cues for hand placement and anterior weight shifting with standing   Stand to Sit 5: Supervision;4: Min guard;With upper extremity assist;With armrests;To chair/3-in-1;Uncontrolled descent   Stand to Sit Details (indicate cue type and reason) Verbal cues for sequencing;Verbal cues for technique;Verbal cues for precautions/safety   Stand to Sit Details cues to turn to surface completely, reach back and use arms to control descent with sitting down     Ambulation/Gait   Ambulation/Gait Yes   Ambulation/Gait Assistance 4: Min guard   Ambulation/Gait Assistance Details cues on posture, right step/foot position with initial contact, for increased left step length and for increased right stance weight shifting with gait.  Ambulation Distance (Feet) 220 Feet  x1; 120 x1   Assistive device Rolling walker  right AFO and toe cap to shoe   Gait Pattern Step-through pattern;Decreased stride length;Decreased step length - right;Decreased step length - left;Decreased stance time - right;Decreased hip/knee flexion - right;Decreased weight shift to right;Shuffle;Ataxic;Trunk flexed;Narrow base of support;Poor foot clearance - right   Ambulation Surface Level;Indoor   Stairs Yes   Stairs Assistance 5: Supervision    Stairs Assistance Details (indicate cue type and reason) cues for posture and hand advancement along rails.    Stair Management Technique Two rails;Forwards;Step to pattern   Number of Stairs 4            PT Short Term Goals - 12/03/15 1107      PT SHORT TERM GOAL #1   Title Pt will initiate HEP for strengthening and balance in order to improve functional mobility.  (Target Date: 11/28/15)   Baseline 12/03/15: met today after reinstruction with pictures to daughter/pt   Status Achieved     PT SHORT TERM GOAL #2   Title Pt will improve TUG score to <80 seconds in order to indicate decreased fall risk.     Baseline 11/25/15: 52.44 sec's with RW and simulated toe cap    Status Achieved     PT SHORT TERM GOAL #3   Title Will assess BERG and improve score by 4 points in order to indicate decreased fall risk and functional improvement in balance.     Baseline 11/27/15: 17/56 today (was 12/56 at baseline)   Status Achieved     PT SHORT TERM GOAL #4   Title Pt will ambulate over indoor surfaces x 150' w/ RW at S level in order to indicate safe negotiation in home.     Baseline 11/25/15: pt able to ambulate this distance with varied amout of assistance (min guard to min assist)   Status Partially Met     PT SHORT TERM GOAL #5   Title Pt will verbalize fall prevention strategies in order to indicate decreased fall risk inside and outside of home.     Baseline 12/03/15: fall prevention strategies issued today with review with pt and daughter   Status Partially Met     PT SHORT TERM GOAL #6   Title Pt will perform 8/10 sit <>stands with single UE support only at mod I level in order to indicate improved functional strength.     Baseline 12/03/15: pt continues to need min>mod assist to power up and to control descent with sitting down using 1-2 UE's to assist.   Status Not Met           PT Long Term Goals - 12/31/15 1509      PT LONG TERM GOAL #1   Title Pt will be independent with HEP in  order to indicate improved functional mobility and decreased fall risk.  (Target Date: 12/26/15)   Baseline met per pt and daughter's report - reports doing them "occasionally"   Status Achieved     PT LONG TERM GOAL #2   Title Pt will improve TUG score to <40 secs in order to indicate decreased fall risk.     Baseline 47.47 secs with RW-made on-going to continue to work towards goal.    Status On-going     PT LONG TERM GOAL #3   Title Pt will improve BERG balance score 8 points from baseline in order to indicate decreased fall risk.     Baseline 10/56  on 12/25/15   Status On-going     PT LONG TERM GOAL #4   Title Pt will perform 8/10 sit<>stand without UE support at mod I level in order to indicate improved functional strength.     Baseline pt performed 10 reps sit to stand with RUE support - unable to perform without UE   Status On-going     PT LONG TERM GOAL #5   Title Pt will ambulate 300' w/ LRAD over paved outdoor surfaces at mod I level in order to indicate safe return to community.     Baseline Pt only able to ambulate 115' over indoor surfaces with min/guard to min A, therefore unsafe to address outdoor gait at this time.     Status On-going     PT LONG TERM GOAL #6   Title Pt will negotiate up/down 5 steps w/ single rail at mod I level in order to indicate safe entry into home/family's homes.     Baseline --   Status On-going           Plan - 12/31/15 1547    Clinical Impression Statement Pt did not meet final LTG as she continues to need bil rail support and goal is for single rail support. Remainder of session worked on gait with brace/toe cap and RW. Pt continues to fatique quickly, needing rest breaks to recover.    PT Frequency 2x / week   PT Duration 4 weeks   PT Treatment/Interventions ADLs/Self Care Home Management;Electrical Stimulation;DME Instruction;Gait training;Stair training;Functional mobility training;Therapeutic activities;Therapeutic exercise;Balance  training;Neuromuscular re-education;Patient/family education;Orthotic Fit/Training;Vestibular;Visual/perceptual remediation/compensation   PT Next Visit Plan continue to work on gait (including barriers), strengthening and balance toward goals of recert (primary PT to complete recert)   Consulted and Agree with Plan of Care Patient;Family member/caregiver   Family Member Consulted daughter,       Patient will benefit from skilled therapeutic intervention in order to improve the following deficits and impairments:  Abnormal gait, Decreased activity tolerance, Decreased balance, Decreased coordination, Decreased endurance, Decreased knowledge of use of DME, Decreased mobility, Decreased strength, Dizziness, Increased edema, Impaired perceived functional ability, Impaired flexibility, Impaired UE functional use, Impaired vision/preception, Improper body mechanics, Postural dysfunction  Visit Diagnosis: Hemiparesis (HCC)  Unsteadiness on feet  Other abnormalities of gait and mobility  Other lack of coordination  Muscle weakness (generalized)     Problem List Patient Active Problem List   Diagnosis Date Noted  . Somnolence, daytime 12/08/2015  . Cerebral thrombosis with cerebral infarction 09/07/2015  . Lacunar infarct, acute ([place])   . Nausea & vomiting 09/05/2015  . Acute kidney injury ([place]) 09/04/2015  . Type II diabetes mellitus with neurological manifestations, uncontrolled ([place]) 09/03/2015  . Bilateral lower extremity edema 09/03/2015  . Essential hypertension   . Diabetes mellitus type 2 in nonobese (HCC)   . History of breast cancer   . AKI (acute kidney injury) ([place])   . Acute blood loss anemia   . Hemiparesis ([place])   . Gait disturbance, post-stroke   . Dysphagia, post-stroke   . Acute ischemic stroke/right cerebellar 08/24/2015  . Breast cancer of upper-outer quadrant of right female breast ([place]) 04/30/2015  . CVA (cerebral infarction) 07/05/2014  . Hyperlipidemia  07/05/2014  . Syncope 07/04/2014  . Mild mitral regurgitation by prior echocardiogram 12/20/2011  . Diabetes mellitus type 1, uncontrolled, insulin dependent ([place]) 02/26/2009  . OBESITY 02/26/2009  . Uncontrolled hypertension 02/26/2009  . CAD (coronary artery disease) 02/26/2009  . GASTROESOPHAGEAL REFLUX DISEASE 02/26/2009  Willow Ora, PTA, [facility] [address], [place], [place] [address] [phone] 12/31/15, 3:49 PM   Name: Devorah Givhan MRN: [id number] Date of Birth: October 26, 1933

## 2016-01-01 ENCOUNTER — Ambulatory Visit: Payer: Medicare Other | Admitting: *Deleted

## 2016-01-01 DIAGNOSIS — R278 Other lack of coordination: Secondary | ICD-10-CM | POA: Diagnosis not present

## 2016-01-01 DIAGNOSIS — I639 Cerebral infarction, unspecified: Secondary | ICD-10-CM

## 2016-01-01 DIAGNOSIS — R471 Dysarthria and anarthria: Secondary | ICD-10-CM

## 2016-01-01 NOTE — Therapy (Signed)
[facility] [address] [place], [place], [address] Phone: [phone]   Fax:  [phone]  Speech Language Pathology Treatment  Patient Details  Name: Glenda [name] MRN: [id number] Date of Birth: [birth year] Referring Provider: Evlyn Courier, N.P.  Encounter Date: 01/01/2016      End of Session - 01/01/16 1659    Visit Number 4   Number of Visits 17   Date for SLP Re-Evaluation 02/20/16   SLP Start Time 1450   SLP Stop Time  1531   SLP Time Calculation (min) 41 min   Activity Tolerance Patient tolerated treatment well      Past Medical History:  Diagnosis Date  . Arthritis   . Breast cancer ([place])   . CAD (coronary artery disease)   . Chest pain   . Dizziness   . DM (diabetes mellitus) ([place])   . GERD (gastroesophageal reflux disease)   . HLD (hyperlipidemia)   . HTN (hypertension)   . Obesity   . Radiation 09/11/15-10/13/15   42.72 Gy to right breast, 12 Gy boost to right breast  . Shortness of breath dyspnea   . Stroke ([place])   . UTI (urinary tract infection)     Past Surgical History:  Procedure Laterality Date  . CHOLECYSTECTOMY    . colon polyps; hx    . CORONARY ANGIOPLASTY  02/14/09  . ESOPHAGOGASTRODUODENOSCOPY (EGD) WITH PROPOFOL Left 09/08/2015   Procedure: ESOPHAGOGASTRODUODENOSCOPY (EGD) WITH PROPOFOL;  Surgeon: Clarene Essex, MD;  Location: [facility] ENDOSCOPY;  Service: Endoscopy;  Laterality: Left;  . RADIOACTIVE SEED GUIDED MASTECTOMY WITH AXILLARY SENTINEL LYMPH NODE BIOPSY Right 07/17/2015   Procedure: RADIOACTIVE SEED GUIDED PARTIAL MASTECTOMY WITH AXILLARY SENTINEL LYMPH NODE BIOPSY;  Surgeon: Erroll Luna, MD;  Location: [facility];  Service: General;  Laterality: Right;    There were no vitals filed for this visit.      Subjective Assessment - 01/01/16 1657    Subjective Pt stated she "sometimes needs help with pronouncing words, they don't come out right!"   Patient is accompained by:  Family member   Currently in Pain? No/denies   Pain Score 0-No pain               ADULT SLP TREATMENT - 01/01/16 0001      General Information   Behavior/Cognition Pleasant mood;Cooperative;Alert     Treatment Provided   Treatment provided Cognitive-Linquistic     Pain Assessment   Pain Assessment No/denies pain     Cognitive-Linquistic Treatment   Treatment focused on Dysarthria   Skilled Treatment Reviewed pt's HEP for overarticulation with her; utilized acronym "SLOP" to reiterate need for compensatory strategies for breath support and overarticulation. She req'd min-mod A (visual, verbal cues) usually for overarticulation through the ENTIRE phrase/sentence. Encouraged pt to complete x2/day. SLP educated pt/daughter with pt's HEP for dysarthria/strengthening, requiring mod to max A consistently. Daughter stated she could A with HEP as needed.     Assessment / Recommendations / Plan   Plan Continue with current plan of care     Progression Toward Goals   Progression toward goals Progressing toward goals            SLP Short Term Goals - 01/01/16 1701      SLP SHORT TERM GOAL #1   Title pt will perform dysarthria HEP with occasional min A over three sessions   Time 3   Period Weeks   Status On-going     SLP SHORT TERM GOAL #2  Title pt will demo dysarthria compensations in 85% of sentences over three sessions   Time 3   Period Weeks   Status On-going          SLP Long Term Goals - 01/01/16 1701      SLP LONG TERM GOAL #1   Title pt will perform HEP for dysarthria with rare min A over three sessions   Time 7   Period Weeks   Status On-going     SLP LONG TERM GOAL #2   Title pt will demo dysarthria compensations 60% of the time in simple conversation   Time 7   Period Weeks   Status On-going          Plan - 01/01/16 1701    Clinical Impression Statement Pt cont to present with mild-mod dysarthria following a CVA. SLP again reiterated need  to perform HEP BID as she has not been completing in that manner. She would benefit from skilled ST to improve communication skills with family and community.   Speech Therapy Frequency 2x / week   Duration --  7 weeks   Treatment/Interventions Oral motor exercises;Compensatory strategies;Functional tasks;Patient/family education;Cueing hierarchy;Multimodal communcation approach;Internal/external aids;SLP instruction and feedback   Potential to Achieve Goals Good   Consulted and Agree with Plan of Care Patient;Family member/caregiver      Patient will benefit from skilled therapeutic intervention in order to improve the following deficits and impairments:   Dysarthria and anarthria  Cerebral infarction due to unspecified mechanism    Problem List Patient Active Problem List   Diagnosis Date Noted  . Somnolence, daytime 12/08/2015  . Cerebral thrombosis with cerebral infarction 09/07/2015  . Lacunar infarct, acute ([place])   . Nausea & vomiting 09/05/2015  . Acute kidney injury ([place]) 09/04/2015  . Type II diabetes mellitus with neurological manifestations, uncontrolled ([place]) 09/03/2015  . Bilateral lower extremity edema 09/03/2015  . Essential hypertension   . Diabetes mellitus type 2 in nonobese (HCC)   . History of breast cancer   . AKI (acute kidney injury) ([place])   . Acute blood loss anemia   . Hemiparesis ([place])   . Gait disturbance, post-stroke   . Dysphagia, post-stroke   . Acute ischemic stroke/right cerebellar 08/24/2015  . Breast cancer of upper-outer quadrant of right female breast ([place]) 04/30/2015  . CVA (cerebral infarction) 07/05/2014  . Hyperlipidemia 07/05/2014  . Syncope 07/04/2014  . Mild mitral regurgitation by prior echocardiogram 12/20/2011  . Diabetes mellitus type 1, uncontrolled, insulin dependent ([place]) 02/26/2009  . OBESITY 02/26/2009  . Uncontrolled hypertension 02/26/2009  . CAD (coronary artery disease) 02/26/2009  . GASTROESOPHAGEAL REFLUX DISEASE  02/26/2009    Raigan Baria,PAT, M.S., CCC-SLP 01/01/2016, 5:03 PM  [facility] [address] [place], [place], [address] Phone: [phone]   Fax:  [phone]   Name: Shivon Hackel MRN: [id number] Date of Birth: Oct 19, 1933

## 2016-01-05 ENCOUNTER — Ambulatory Visit: Payer: Medicare Other | Admitting: Physical Therapy

## 2016-01-05 ENCOUNTER — Ambulatory Visit: Payer: Medicare Other | Admitting: *Deleted

## 2016-01-05 ENCOUNTER — Encounter: Payer: Self-pay | Admitting: Physical Therapy

## 2016-01-05 DIAGNOSIS — R2689 Other abnormalities of gait and mobility: Secondary | ICD-10-CM

## 2016-01-05 DIAGNOSIS — R278 Other lack of coordination: Secondary | ICD-10-CM | POA: Diagnosis not present

## 2016-01-05 DIAGNOSIS — R471 Dysarthria and anarthria: Secondary | ICD-10-CM

## 2016-01-05 DIAGNOSIS — G8190 Hemiplegia, unspecified affecting unspecified side: Secondary | ICD-10-CM

## 2016-01-05 DIAGNOSIS — R2681 Unsteadiness on feet: Secondary | ICD-10-CM

## 2016-01-05 NOTE — Therapy (Signed)
[facility] [address] [place], [place], [address] Phone: [phone]   Fax:  [phone]  Speech Language Pathology Treatment  Patient Details  Name: Glenda [name] MRN: [id number] Date of Birth: 25-Apr-1934 Referring Provider: Evlyn Courier, N.P.  Encounter Date: 01/05/2016      End of Session - 01/05/16 1358    Visit Number 5   Number of Visits 17   Date for SLP Re-Evaluation 02/20/16   SLP Start Time 1315   SLP Stop Time  1357   SLP Time Calculation (min) 42 min   Activity Tolerance Patient tolerated treatment well      Past Medical History:  Diagnosis Date  . Arthritis   . Breast cancer ([place])   . CAD (coronary artery disease)   . Chest pain   . Dizziness   . DM (diabetes mellitus) ([place])   . GERD (gastroesophageal reflux disease)   . HLD (hyperlipidemia)   . HTN (hypertension)   . Obesity   . Radiation 09/11/15-10/13/15   42.72 Gy to right breast, 12 Gy boost to right breast  . Shortness of breath dyspnea   . Stroke ([place])   . UTI (urinary tract infection)     Past Surgical History:  Procedure Laterality Date  . CHOLECYSTECTOMY    . colon polyps; hx    . CORONARY ANGIOPLASTY  02/14/09  . ESOPHAGOGASTRODUODENOSCOPY (EGD) WITH PROPOFOL Left 09/08/2015   Procedure: ESOPHAGOGASTRODUODENOSCOPY (EGD) WITH PROPOFOL;  Surgeon: Clarene Essex, MD;  Location: [facility] ENDOSCOPY;  Service: Endoscopy;  Laterality: Left;  . RADIOACTIVE SEED GUIDED MASTECTOMY WITH AXILLARY SENTINEL LYMPH NODE BIOPSY Right 07/17/2015   Procedure: RADIOACTIVE SEED GUIDED PARTIAL MASTECTOMY WITH AXILLARY SENTINEL LYMPH NODE BIOPSY;  Surgeon: Erroll Luna, MD;  Location: [facility];  Service: General;  Laterality: Right;    There were no vitals filed for this visit.      Subjective Assessment - 01/05/16 1331    Subjective "I'm doing alright."   Currently in Pain? No/denies               ADULT SLP TREATMENT - 01/05/16 1339      General Information   Behavior/Cognition Pleasant mood;Cooperative;Alert     Treatment Provided   Treatment provided Cognitive-Linquistic     Cognitive-Linquistic Treatment   Treatment focused on Dysarthria   Skilled Treatment Pt with improved dysarthria this date. Intermittent min A at the sentence - conversational speech level to increase error awareness. Pt unable to recall any compensatory strategies.     Assessment / Recommendations / Plan   Plan Continue with current plan of care     Progression Toward Goals   Progression toward goals Progressing toward goals            SLP Short Term Goals - 01/05/16 1400      SLP SHORT TERM GOAL #1   Title pt will perform dysarthria HEP with occasional min A over three sessions   Status On-going     SLP SHORT TERM GOAL #2   Title pt will demo dysarthria compensations in 85% of sentences over three sessions   Baseline 01/05/16,    Status On-going          SLP Long Term Goals - 01/05/16 1401      SLP LONG TERM GOAL #1   Title pt will perform HEP for dysarthria with rare min A over three sessions   Status On-going     SLP LONG TERM GOAL #2   Title  pt will demo dysarthria compensations 60% of the time in simple conversation   Status On-going          Plan - 01/05/16 1359    Clinical Impression Statement Pt cont to present with mild dysarthria following a CVA. She would benefit from skilled ST to improve communication skills with family and community.   Speech Therapy Frequency 2x / week   Duration --  7 weeks   Treatment/Interventions Oral motor exercises;Compensatory strategies;Functional tasks;Patient/family education;Cueing hierarchy;Multimodal communcation approach;Internal/external aids;SLP instruction and feedback   Potential to Achieve Goals Good   Potential Considerations Ability to learn/carryover information   Consulted and Agree with Plan of Care Patient;Family member/caregiver   Family Member Consulted  daughter      Patient will benefit from skilled therapeutic intervention in order to improve the following deficits and impairments:   Dysarthria and anarthria    Problem List Patient Active Problem List   Diagnosis Date Noted  . Somnolence, daytime 12/08/2015  . Cerebral thrombosis with cerebral infarction 09/07/2015  . Lacunar infarct, acute ([place])   . Nausea & vomiting 09/05/2015  . Acute kidney injury ([place]) 09/04/2015  . Type II diabetes mellitus with neurological manifestations, uncontrolled ([place]) 09/03/2015  . Bilateral lower extremity edema 09/03/2015  . Essential hypertension   . Diabetes mellitus type 2 in nonobese (HCC)   . History of breast cancer   . AKI (acute kidney injury) ([place])   . Acute blood loss anemia   . Hemiparesis ([place])   . Gait disturbance, post-stroke   . Dysphagia, post-stroke   . Acute ischemic stroke/right cerebellar 08/24/2015  . Breast cancer of upper-outer quadrant of right female breast ([place]) 04/30/2015  . CVA (cerebral infarction) 07/05/2014  . Hyperlipidemia 07/05/2014  . Syncope 07/04/2014  . Mild mitral regurgitation by prior echocardiogram 12/20/2011  . Diabetes mellitus type 1, uncontrolled, insulin dependent ([place]) 02/26/2009  . OBESITY 02/26/2009  . Uncontrolled hypertension 02/26/2009  . CAD (coronary artery disease) 02/26/2009  . GASTROESOPHAGEAL REFLUX DISEASE 02/26/2009    [place], CCC-SLP 01/05/2016, 2:02 PM  [facility] [address] [place], [place], [address] Phone: [phone]   Fax:  [phone]   Name: Glenda [name] MRN: [id number] Date of Birth: 1933-05-08

## 2016-01-06 NOTE — Therapy (Signed)
[facility] [address] [place], [place], [address] Phone: [phone]   Fax:  [phone]  Physical Therapy Treatment  Patient Details  Name: Glenda [name] MRN: [id number] Date of Birth: July 28, 1933 Referring Provider: Donnie Coffin, MD  Encounter Date: 01/05/2016      PT End of Session - 01/05/16 1406    Visit Number 19   Number of Visits 25   Date for PT Re-Evaluation 01/29/16   Authorization Type [employer] MCR- G code needed every 10th visit   PT Start Time 1402   PT Stop Time 1445   PT Time Calculation (min) 43 min   Equipment Utilized During Treatment Gait belt   Activity Tolerance Patient tolerated treatment well;Patient limited by fatigue   Behavior During Therapy [facility] for tasks assessed/performed      Past Medical History:  Diagnosis Date  . Arthritis   . Breast cancer ([place])   . CAD (coronary artery disease)   . Chest pain   . Dizziness   . DM (diabetes mellitus) ([place])   . GERD (gastroesophageal reflux disease)   . HLD (hyperlipidemia)   . HTN (hypertension)   . Obesity   . Radiation 09/11/15-10/13/15   42.72 Gy to right breast, 12 Gy boost to right breast  . Shortness of breath dyspnea   . Stroke ([place])   . UTI (urinary tract infection)     Past Surgical History:  Procedure Laterality Date  . CHOLECYSTECTOMY    . colon polyps; hx    . CORONARY ANGIOPLASTY  02/14/09  . ESOPHAGOGASTRODUODENOSCOPY (EGD) WITH PROPOFOL Left 09/08/2015   Procedure: ESOPHAGOGASTRODUODENOSCOPY (EGD) WITH PROPOFOL;  Surgeon: Clarene Essex, MD;  Location: [facility] ENDOSCOPY;  Service: Endoscopy;  Laterality: Left;  . RADIOACTIVE SEED GUIDED MASTECTOMY WITH AXILLARY SENTINEL LYMPH NODE BIOPSY Right 07/17/2015   Procedure: RADIOACTIVE SEED GUIDED PARTIAL MASTECTOMY WITH AXILLARY SENTINEL LYMPH NODE BIOPSY;  Surgeon: Erroll Luna, MD;  Location: [facility];  Service: General;  Laterality: Right;    There were no vitals filed for this  visit.      Subjective Assessment - 01/05/16 1403    Subjective No new complaints. No falls or pain to report. Reports going to church outdoor festival over weekend and listened to music all day long. Wearing brace, feels it's heavy.   Patient is accompained by: Family member   Limitations Walking;House hold activities   Patient Stated Goals "I want to work on walking"    Currently in Pain? No/denies   Pain Score 0-No pain             [facility] Adult PT Treatment/Exercise - 01/05/16 1406      Transfers   Transfers Sit to Stand;Stand to Sit   Sit to Stand 4: Min guard;With upper extremity assist;From chair/3-in-1   Sit to Stand Details Verbal cues for sequencing;Verbal cues for precautions/safety;Verbal cues for safe use of DME/AE   Sit to Stand Details (indicate cue type and reason) cues for hand placement and anterior weight shift with majority of standing trials   Stand to Sit 4: Min guard;With upper extremity assist;To bed;Uncontrolled descent   Stand to Sit Details (indicate cue type and reason) Verbal cues for precautions/safety;Verbal cues for safe use of DME/AE   Stand to Sit Details cues to turn all the way to surface and reach back with majority of transfers to sitting     Ambulation/Gait   Ambulation/Gait Yes   Ambulation/Gait Assistance 4: Min guard;4: Min assist   Ambulation/Gait Assistance  Details cues on posture, step length and for normalized base of support. facilitation provided though walker to speed up gait cadence to allow pt to take longer steps with no carry over noted when facilitation stopped.                                  Ambulation Distance (Feet) 220 Feet  x2   Assistive device Rolling walker   Gait Pattern Step-through pattern;Decreased stride length;Decreased step length - right;Decreased step length - left;Decreased stance time - right;Decreased hip/knee flexion - right;Decreased weight shift to right;Shuffle;Ataxic;Trunk flexed;Narrow base of  support;Poor foot clearance - right   Ambulation Surface Level;Indoor     Knee/Hip Exercises: Standing   Heel Raises Both;1 set;10 reps;5 seconds;Limitations   Heel Raises Limitations cues on posture and hold times with bil UE support on back of chair   Hip Flexion AROM;Stengthening;Both;1 set;10 reps;Knee bent;Limitations   Hip Flexion Limitations 1# ankle weights, alternating legs, cues on form and posture.   Hip Abduction AROM;Stengthening;Both;1 set;10 reps;Knee straight;Limitations   Abduction Limitations with 1# ankle weights, alternating legs with UE support on chair back for balance. cues on posture and ex form needed   Hip Extension AROM;Stengthening;Both;1 set;10 reps;Knee straight;Limitations   Extension Limitations 1# ankle weights, alternating legs, cues on form, posture and ex technique.             PT Short Term Goals - 12/03/15 1107      PT SHORT TERM GOAL #1   Title Pt will initiate HEP for strengthening and balance in order to improve functional mobility.  (Target Date: 11/28/15)   Baseline 12/03/15: met today after reinstruction with pictures to daughter/pt   Status Achieved     PT SHORT TERM GOAL #2   Title Pt will improve TUG score to <80 seconds in order to indicate decreased fall risk.     Baseline 11/25/15: 52.44 sec's with RW and simulated toe cap    Status Achieved     PT SHORT TERM GOAL #3   Title Will assess BERG and improve score by 4 points in order to indicate decreased fall risk and functional improvement in balance.     Baseline 11/27/15: 17/56 today (was 12/56 at baseline)   Status Achieved     PT SHORT TERM GOAL #4   Title Pt will ambulate over indoor surfaces x 150' w/ RW at S level in order to indicate safe negotiation in home.     Baseline 11/25/15: pt able to ambulate this distance with varied amout of assistance (min guard to min assist)   Status Partially Met     PT SHORT TERM GOAL #5   Title Pt will verbalize fall prevention strategies in  order to indicate decreased fall risk inside and outside of home.     Baseline 12/03/15: fall prevention strategies issued today with review with pt and daughter   Status Partially Met     PT SHORT TERM GOAL #6   Title Pt will perform 8/10 sit <>stands with single UE support only at mod I level in order to indicate improved functional strength.     Baseline 12/03/15: pt continues to need min>mod assist to power up and to control descent with sitting down using 1-2 UE's to assist.   Status Not Met           PT Long Term Goals - 12/31/15 3790  PT LONG TERM GOAL #1   Title Pt will be independent with HEP in order to indicate improved functional mobility and decreased fall risk.  (Target Date: 12/26/15)   Baseline met per pt and daughter's report - reports doing them "occasionally"   Status Achieved     PT LONG TERM GOAL #2   Title Pt will improve TUG score to <40 secs in order to indicate decreased fall risk.     Baseline 47.47 secs with RW-made on-going to continue to work towards goal.    Status On-going     PT LONG TERM GOAL #3   Title Pt will improve BERG balance score 8 points from baseline in order to indicate decreased fall risk.     Baseline 10/56 on 12/25/15   Status On-going     PT LONG TERM GOAL #4   Title Pt will perform 8/10 sit<>stand without UE support at mod I level in order to indicate improved functional strength.     Baseline pt performed 10 reps sit to stand with RUE support - unable to perform without UE   Status On-going     PT LONG TERM GOAL #5   Title Pt will ambulate 300' w/ LRAD over paved outdoor surfaces at mod I level in order to indicate safe return to community.     Baseline Pt only able to ambulate 115' over indoor surfaces with min/guard to min A, therefore unsafe to address outdoor gait at this time.     Status On-going     PT LONG TERM GOAL #6   Title Pt will negotiate up/down 5 steps w/ single rail at mod I level in order to indicate safe entry  into home/family's homes.     Baseline --   Status On-going             Plan - 01/05/16 1406    Clinical Impression Statement today's session continued to address gait and LE strengthening with only fatigue reported. Pt is now consistently going further distances with gait. Discussed with pt/daughter for her to start walking out/into home, church and therapy with RW and wheelchair follow until she can make it in/out of each 3 consecutive times without needing the wheelchair. Both verbalized understanding. Pt is making slow, steady progress toward goals.                              PT Frequency 2x / week   PT Duration 4 weeks   PT Treatment/Interventions ADLs/Self Care Home Management;Electrical Stimulation;DME Instruction;Gait training;Stair training;Functional mobility training;Therapeutic activities;Therapeutic exercise;Balance training;Neuromuscular re-education;Patient/family education;Orthotic Fit/Training;Vestibular;Visual/perceptual remediation/compensation   PT Next Visit Plan G-code next visist;continue to work on gait (including barriers), strengthening and balance toward goals of recert (primary PT to complete recert)   Consulted and Agree with Plan of Care Patient;Family member/caregiver   Family Member Consulted daughter,       Patient will benefit from skilled therapeutic intervention in order to improve the following deficits and impairments:  Abnormal gait, Decreased activity tolerance, Decreased balance, Decreased coordination, Decreased endurance, Decreased knowledge of use of DME, Decreased mobility, Decreased strength, Dizziness, Increased edema, Impaired perceived functional ability, Impaired flexibility, Impaired UE functional use, Impaired vision/preception, Improper body mechanics, Postural dysfunction  Visit Diagnosis: Hemiparesis (HCC)  Unsteadiness on feet  Other abnormalities of gait and mobility  Other lack of coordination     Problem List Patient  Active Problem List   Diagnosis Date Noted  .  Somnolence, daytime 12/08/2015  . Cerebral thrombosis with cerebral infarction 09/07/2015  . Lacunar infarct, acute ([place])   . Nausea & vomiting 09/05/2015  . Acute kidney injury ([place]) 09/04/2015  . Type II diabetes mellitus with neurological manifestations, uncontrolled ([place]) 09/03/2015  . Bilateral lower extremity edema 09/03/2015  . Essential hypertension   . Diabetes mellitus type 2 in nonobese (HCC)   . History of breast cancer   . AKI (acute kidney injury) ([place])   . Acute blood loss anemia   . Hemiparesis ([place])   . Gait disturbance, post-stroke   . Dysphagia, post-stroke   . Acute ischemic stroke/right cerebellar 08/24/2015  . Breast cancer of upper-outer quadrant of right female breast ([place]) 04/30/2015  . CVA (cerebral infarction) 07/05/2014  . Hyperlipidemia 07/05/2014  . Syncope 07/04/2014  . Mild mitral regurgitation by prior echocardiogram 12/20/2011  . Diabetes mellitus type 1, uncontrolled, insulin dependent ([place]) 02/26/2009  . OBESITY 02/26/2009  . Uncontrolled hypertension 02/26/2009  . CAD (coronary artery disease) 02/26/2009  . GASTROESOPHAGEAL REFLUX DISEASE 02/26/2009    Willow Ora, PTA, [facility] [address] [place], [place] [address] [phone] 01/06/16, 12:18 PM   Name: Krisy Dix MRN: [id number] Date of Birth: 1933/08/13

## 2016-01-08 ENCOUNTER — Encounter: Payer: Self-pay | Admitting: Rehabilitation

## 2016-01-08 ENCOUNTER — Ambulatory Visit: Payer: Medicare Other | Admitting: Occupational Therapy

## 2016-01-08 ENCOUNTER — Ambulatory Visit: Payer: Medicare Other | Admitting: *Deleted

## 2016-01-08 ENCOUNTER — Ambulatory Visit: Payer: Medicare Other | Admitting: Rehabilitation

## 2016-01-08 DIAGNOSIS — R2681 Unsteadiness on feet: Secondary | ICD-10-CM

## 2016-01-08 DIAGNOSIS — R2689 Other abnormalities of gait and mobility: Secondary | ICD-10-CM

## 2016-01-08 DIAGNOSIS — R278 Other lack of coordination: Secondary | ICD-10-CM | POA: Diagnosis not present

## 2016-01-08 DIAGNOSIS — R41842 Visuospatial deficit: Secondary | ICD-10-CM

## 2016-01-08 DIAGNOSIS — R471 Dysarthria and anarthria: Secondary | ICD-10-CM

## 2016-01-08 DIAGNOSIS — G8190 Hemiplegia, unspecified affecting unspecified side: Secondary | ICD-10-CM

## 2016-01-08 DIAGNOSIS — M6281 Muscle weakness (generalized): Secondary | ICD-10-CM

## 2016-01-08 NOTE — Therapy (Signed)
[facility] [address] [place], [place], [address] Phone: [phone]   Fax:  [phone]  Occupational Therapy Treatment  Patient Details  Name: Glenda [name] MRN: [id number] Date of Birth: 1933/08/11 Referring Provider: Dr. Donnie Coffin  Encounter Date: 01/08/2016      OT End of Session - 01/08/16 1717    Visit Number 16   Number of Visits 32   Date for OT Re-Evaluation 03/07/16   Authorization Type UHC MCR- short term goals due 02/06/16   Authorization - Visit Number 16   Authorization - Number of Visits 20   OT Start Time 1534   OT Stop Time 1614   OT Time Calculation (min) 40 min   Activity Tolerance Patient limited by fatigue   Behavior During Therapy [facility] for tasks assessed/performed      Past Medical History:  Diagnosis Date  . Arthritis   . Breast cancer ([place])   . CAD (coronary artery disease)   . Chest pain   . Dizziness   . DM (diabetes mellitus) ([place])   . GERD (gastroesophageal reflux disease)   . HLD (hyperlipidemia)   . HTN (hypertension)   . Obesity   . Radiation 09/11/15-10/13/15   42.72 Gy to right breast, 12 Gy boost to right breast  . Shortness of breath dyspnea   . Stroke ([place])   . UTI (urinary tract infection)     Past Surgical History:  Procedure Laterality Date  . CHOLECYSTECTOMY    . colon polyps; hx    . CORONARY ANGIOPLASTY  02/14/09  . ESOPHAGOGASTRODUODENOSCOPY (EGD) WITH PROPOFOL Left 09/08/2015   Procedure: ESOPHAGOGASTRODUODENOSCOPY (EGD) WITH PROPOFOL;  Surgeon: Clarene Essex, MD;  Location: [facility] ENDOSCOPY;  Service: Endoscopy;  Laterality: Left;  . RADIOACTIVE SEED GUIDED MASTECTOMY WITH AXILLARY SENTINEL LYMPH NODE BIOPSY Right 07/17/2015   Procedure: RADIOACTIVE SEED GUIDED PARTIAL MASTECTOMY WITH AXILLARY SENTINEL LYMPH NODE BIOPSY;  Surgeon: Erroll Luna, MD;  Location: [facility];  Service: General;  Laterality: Right;    There were no vitals filed for this  visit.      Subjective Assessment - 01/08/16 1540    Currently in Pain? No/denies             Treatment: Therapist checked progress towards remaining long and short term goals and discussed progress and updated goals with pt/ daughter. Therapist provided pt with a list of activities to continue at home.                   OT Short Term Goals - 01/08/16 1541      OT SHORT TERM GOAL #1   Title I with inital HEP.   Time 4   Period Weeks   Status Achieved     OT SHORT TERM GOAL #2   Title Pt will demonstrate ability to retrieve a lightweight object at 110* with RUE.   Time 4   Period Weeks   Status --  100*     OT SHORT TERM GOAL #3   Title Pt will demonstrate improved fine motor coordination as evidenced by decreasing 9 hole peg test score by 4 secs bilaterally.   Status Partially Met  met for LUE only 36.40, RUE 75.72, 69.09     OT SHORT TERM GOAL #4   Title Pt will perform simple snack prep or light home management task in standing with min A without LOB demonstrating good safety awareness.   Time 4   Period Weeks   Status  Achieved     OT SHORT TERM GOAL #5   Title Pt will demonstrate ability to write a short paragraph with 95% legibility.   Time 4   Period Weeks   Status On-going  not consistent, 80-95% legibility     Additional Short Term Goals   Additional Short Term Goals --     OT SHORT TERM GOAL #6   Title Further assess cognition and set goal PRN   Time 4   Period Weeks   Status Deferred     OT SHORT TERM GOAL #7   Title Pt will demonstrate ability to navigate a busy environment and locate items with 80% accuracy.   Time 4   Period Weeks   Status New     OT SHORT TERM GOAL #8   Title Pt demonstrate improved fine motor coordination as evidenced by perfoming 9 hole peg test in 63 secs or less with RUE    Baseline 75.72, 69.09   Time 4   Period Weeks   Status New           OT Long Term Goals - 01/08/16 1551      OT LONG  TERM GOAL #1   Title I with updated HEP.   Time 8   Period Weeks   Status On-going  previous HEP still appropriate      OT LONG TERM GOAL #2   Title Pt will perform simple cooking task in standing with supervision demonstrating good safety awareness.- Revised goal-Pt will perform simple cooking task in standing with min A.   Time 8   Period Weeks   Status Revised  not fully addressed due to pt's decline since TIA     OT LONG TERM GOAL #3   Title Pt will demonstrate ability to retrieve a light weight object at 120 shoulder flexion with RUE   Time 8   Period Weeks   Status On-going  95-100     OT LONG TERM GOAL #4   Title Pt will resume use of RUE as dominant hand at least 75% of the time for ADLs/IADLs.   Time 8   Period Weeks   Status On-going  50%     OT LONG TERM GOAL #5   Title Pt will decrease RUE 9 hole peg test score to 49 secs from for increased fine motor coordination. - Revised pt will complete 9 hole peg test in 54 secs or less with RUE.   Status Revised  75.72, 69.09     OT LONG TERM GOAL #6   Title Pt will be modified indpendent with all basic ADLs.   Time 8   Period Weeks   Status Achieved  except for supervision with shower transfers               Plan - 01/08/16 1706    Clinical Impression Statement Pt is progressing overall yet she did not fully meet goals as she had a decline in status with recent TIA. Pt now demonstrates more pronounced RUE weakness, and coordination deficits as well as right inattention. Pt can benfit from skilled occupational therapy to maximize functional independence with ADLs/IADLS.   Rehab Potential Good   OT Frequency 2x / week   OT Duration 8 weeks   OT Treatment/Interventions Self-care/ADL training;Therapeutic exercise;Patient/family education;Functional Mobility Training;Neuromuscular education;Manual Therapy;Therapeutic exercises;DME and/or AE instruction;Therapeutic activities;Electrical  Stimulation;Fluidtherapy;Cognitive remediation/compensation;Moist Heat;Passive range of motion;Visual/perceptual remediation/compensation   Plan continue to work towards unmet and updated goals.   Consulted and  Agree with Plan of Care Patient      Patient will benefit from skilled therapeutic intervention in order to improve the following deficits and impairments:  Abnormal gait, Decreased coordination, Decreased range of motion, Difficulty walking, Decreased safety awareness, Decreased endurance, Decreased activity tolerance, Decreased knowledge of precautions, Impaired UE functional use, Decreased knowledge of use of DME, Decreased balance, Decreased cognition, Decreased mobility, Decreased strength, Impaired perceived functional ability  Visit Diagnosis: Hemiparesis (HCC)  Other lack of coordination  Unsteadiness on feet  Other abnormalities of gait and mobility  Muscle weakness (generalized)  Visuospatial deficit    Problem List Patient Active Problem List   Diagnosis Date Noted  . Somnolence, daytime 12/08/2015  . Cerebral thrombosis with cerebral infarction 09/07/2015  . Lacunar infarct, acute ([place])   . Nausea & vomiting 09/05/2015  . Acute kidney injury ([place]) 09/04/2015  . Type II diabetes mellitus with neurological manifestations, uncontrolled ([place]) 09/03/2015  . Bilateral lower extremity edema 09/03/2015  . Essential hypertension   . Diabetes mellitus type 2 in nonobese (HCC)   . History of breast cancer   . AKI (acute kidney injury) ([place])   . Acute blood loss anemia   . Hemiparesis ([place])   . Gait disturbance, post-stroke   . Dysphagia, post-stroke   . Acute ischemic stroke/right cerebellar 08/24/2015  . Breast cancer of upper-outer quadrant of right female breast ([place]) 04/30/2015  . CVA (cerebral infarction) 07/05/2014  . Hyperlipidemia 07/05/2014  . Syncope 07/04/2014  . Mild mitral regurgitation by prior echocardiogram 12/20/2011  . Diabetes mellitus type 1,  uncontrolled, insulin dependent ([place]) 02/26/2009  . OBESITY 02/26/2009  . Uncontrolled hypertension 02/26/2009  . CAD (coronary artery disease) 02/26/2009  . GASTROESOPHAGEAL REFLUX DISEASE 02/26/2009    RINE,KATHRYN 01/08/2016, 5:18 PM Theone Murdoch, OTR/L [phone] Phone: [phone] 5:20 PM 01/08/16 [facility] [address] [place], [place], [address] Phone: [phone]   Fax:  [phone]  Name: Glenda [name] MRN: [id number] Date of Birth: 04/19/1934

## 2016-01-08 NOTE — Patient Instructions (Signed)
Continue working on coordination activities at [employer] with your arm supported  Continue ball exercises  Try to use your right hand as much as possible for activities that are light weight and not hot, sharp or breakable  Look to the right side when walking

## 2016-01-08 NOTE — Patient Instructions (Signed)
Weight Shift: Anterior / Posterior (Righting / Equilibrium)    BEGIN WITH BACK LEANING AGAINST THE WALL AND FEET 5 INCHES AWAY. Move your hips off the wall and come to upright standing.  Try not to use your hands but have the walker in front of you if you need it for support.  Hold for 5 seconds. Return slowly to the wall letting your hips bump the wall and return to stand.   Hold each position _5___ seconds. Repeat _10__ times per session. Do _1-2__ sessions per day.

## 2016-01-08 NOTE — Therapy (Signed)
[facility] [address] [place], [place], [address] Phone: [phone]   Fax:  [phone]  Physical Therapy Treatment  Patient Details  Name: Glenda [name] MRN: [id number] Date of Birth: 1933/09/21 Referring Provider: Donnie Coffin, MD  Encounter Date: 01/08/2016      PT End of Session - 01/08/16 1109    Visit Number 20   Number of Visits 25   Date for PT Re-Evaluation 01/29/16   Authorization Type UHC MCR- G code needed every 10th visit   PT Start Time 1101   PT Stop Time 1145   PT Time Calculation (min) 44 min   Equipment Utilized During Treatment Gait belt   Activity Tolerance Patient tolerated treatment well;Patient limited by fatigue   Behavior During Therapy [facility] for tasks assessed/performed      Past Medical History:  Diagnosis Date  . Arthritis   . Breast cancer ([place])   . CAD (coronary artery disease)   . Chest pain   . Dizziness   . DM (diabetes mellitus) ([place])   . GERD (gastroesophageal reflux disease)   . HLD (hyperlipidemia)   . HTN (hypertension)   . Obesity   . Radiation 09/11/15-10/13/15   42.72 Gy to right breast, 12 Gy boost to right breast  . Shortness of breath dyspnea   . Stroke ([place])   . UTI (urinary tract infection)     Past Surgical History:  Procedure Laterality Date  . CHOLECYSTECTOMY    . colon polyps; hx    . CORONARY ANGIOPLASTY  02/14/09  . ESOPHAGOGASTRODUODENOSCOPY (EGD) WITH PROPOFOL Left 09/08/2015   Procedure: ESOPHAGOGASTRODUODENOSCOPY (EGD) WITH PROPOFOL;  Surgeon: Clarene Essex, MD;  Location: [facility] ENDOSCOPY;  Service: Endoscopy;  Laterality: Left;  . RADIOACTIVE SEED GUIDED MASTECTOMY WITH AXILLARY SENTINEL LYMPH NODE BIOPSY Right 07/17/2015   Procedure: RADIOACTIVE SEED GUIDED PARTIAL MASTECTOMY WITH AXILLARY SENTINEL LYMPH NODE BIOPSY;  Surgeon: Erroll Luna, MD;  Location: [facility];  Service: General;  Laterality: Right;    There were no vitals filed for this  visit.      Subjective Assessment - 01/08/16 1107    Subjective No c/o pain, no changes.  Repots walking more at home.     Patient is accompained by: Family member   Limitations Walking;House hold activities   Patient Stated Goals "I want to work on walking"    Currently in Pain? No/denies                         [facility] Adult PT Treatment/Exercise - 01/08/16 1124      Standardized Balance Assessment   Standardized Balance Assessment Berg Balance Test     Berg Balance Test   Sit to Stand Able to stand  independently using hands   Standing Unsupported Able to stand 2 minutes with supervision   Sitting with Back Unsupported but Feet Supported on Floor or Stool Able to sit safely and securely 2 minutes   Stand to Sit Controls descent by using hands   Transfers Needs one person to assist   Standing Unsupported with Eyes Closed Unable to keep eyes closed 3 seconds but stays steady   Standing Ubsupported with Feet Together Needs help to attain position and unable to hold for 15 seconds   From Standing, Reach Forward with Outstretched Arm Reaches forward but needs supervision   From Standing Position, Pick up Object from Floor Unable to pick up and needs supervision   From Standing Position, Turn  to Look Behind Over each Shoulder Turn sideways only but maintains balance   Turn 360 Degrees Needs assistance while turning   Standing Unsupported, Alternately Place Feet on Step/Stool Needs assistance to keep from falling or unable to try   Standing Unsupported, One Foot in [employer] balance while stepping or standing   Standing on One Leg Unable to try or needs assist to prevent fall   Total Score 19       NMR:  Performed BERG balance test, see details above.  Note marked improvement in score from end of August (10/56) to 19/56 today.  Educated on results with pt and daughter verbalizing understanding.  Pt very pleased with progress and can tell she is performing more  activity/exercises at home.    TA:  Assessed TUG for PN/gcode and note that time has improved to 52.54 seconds, still indicative of high fall risk, but note mechanics are much improved with RW and use of her personal brace and toe cap.  Education on using shoes and brace with church wear as well to improve safety with walking into/out of church.   TE:  Provided pt with wall bump exercise during session to improve forward weight shift and hip extension for improved balance.  Performed x 4 reps with feet approx 3 inches from wall progressing to x 5 reps 5-6 inches from wall, therefore provided this at home.    Pt continues to demonstrate poor endurance and the need for constant rest breaks.  Encouraged pt to work through fatigue to a certain point to increase endurance.          PT Education - 01/08/16 1108    Education provided Yes   Education Details education on wearing shoes with brace to church, addition of wall bumps to HEP   Person(s) Educated Patient;Child(ren)   Methods Explanation;Demonstration;Handout   Comprehension Verbalized understanding;Returned demonstration          PT Short Term Goals - 12/03/15 1107      PT SHORT TERM GOAL #1   Title Pt will initiate HEP for strengthening and balance in order to improve functional mobility.  (Target Date: 11/28/15)   Baseline 12/03/15: met today after reinstruction with pictures to daughter/pt   Status Achieved     PT SHORT TERM GOAL #2   Title Pt will improve TUG score to <80 seconds in order to indicate decreased fall risk.     Baseline 11/25/15: 52.44 sec's with RW and simulated toe cap    Status Achieved     PT SHORT TERM GOAL #3   Title Will assess BERG and improve score by 4 points in order to indicate decreased fall risk and functional improvement in balance.     Baseline 11/27/15: 17/56 today (was 12/56 at baseline)   Status Achieved     PT SHORT TERM GOAL #4   Title Pt will ambulate over indoor surfaces x 150' w/ RW at S  level in order to indicate safe negotiation in home.     Baseline 11/25/15: pt able to ambulate this distance with varied amout of assistance (min guard to min assist)   Status Partially Met     PT SHORT TERM GOAL #5   Title Pt will verbalize fall prevention strategies in order to indicate decreased fall risk inside and outside of home.     Baseline 12/03/15: fall prevention strategies issued today with review with pt and daughter   Status Partially Met     PT SHORT  TERM GOAL #6   Title Pt will perform 8/10 sit <>stands with single UE support only at mod I level in order to indicate improved functional strength.     Baseline 12/03/15: pt continues to need min>mod assist to power up and to control descent with sitting down using 1-2 UE's to assist.   Status Not Met           PT Long Term Goals - 01/08/16 1215      PT LONG TERM GOAL #1   Title Pt will be independent with HEP in order to indicate improved functional mobility and decreased fall risk.  (Modified Target Date: 01/29/16)   Baseline met per pt and daughter's report - reports doing them "occasionally"   Status Achieved     PT LONG TERM GOAL #2   Title Pt will improve TUG score to <40 secs in order to indicate decreased fall risk.     Baseline 47.47 secs with RW-made on-going to continue to work towards goal.    Status On-going     PT LONG TERM GOAL #3   Title Pt will improve BERG balance score 8 points from baseline in order to indicate decreased fall risk.     Baseline 10/56 on 12/25/15   Status On-going     PT LONG TERM GOAL #4   Title Pt will perform 8/10 sit<>stand without UE support at mod I level in order to indicate improved functional strength.     Baseline pt performed 10 reps sit to stand with RUE support - unable to perform without UE   Status On-going     PT LONG TERM GOAL #5   Title Pt will ambulate 300' w/ LRAD over paved outdoor surfaces at mod I level in order to indicate safe return to community.      Baseline Pt only able to ambulate 115' over indoor surfaces with min/guard to min A, therefore unsafe to address outdoor gait at this time.     Status On-going     PT LONG TERM GOAL #6   Title Pt will negotiate up/down 5 steps w/ single rail at mod I level in order to indicate safe entry into home/family's homes.     Status On-going               Plan - 01/08/16 1109    Clinical Impression Statement Skilled session focused on addressing gait speed/safety with TUG assessment, BERG for progress note to MD and addition of wall bumps to HEP as she tends to keep COG held posteriorly causing LOB.     PT Frequency 2x / week   PT Duration 4 weeks   PT Treatment/Interventions ADLs/Self Care Home Management;Electrical Stimulation;DME Instruction;Gait training;Stair training;Functional mobility training;Therapeutic activities;Therapeutic exercise;Balance training;Neuromuscular re-education;Patient/family education;Orthotic Fit/Training;Vestibular;Visual/perceptual remediation/compensation   PT Next Visit Plan Continue to work on gait (including barriers), strengthening and balance toward goals of recert (primary PT to complete recert)   Consulted and Agree with Plan of Care Patient;Family member/caregiver   Family Member Consulted daughter,       Patient will benefit from skilled therapeutic intervention in order to improve the following deficits and impairments:  Abnormal gait, Decreased activity tolerance, Decreased balance, Decreased coordination, Decreased endurance, Decreased knowledge of use of DME, Decreased mobility, Decreased strength, Dizziness, Increased edema, Impaired perceived functional ability, Impaired flexibility, Impaired UE functional use, Impaired vision/preception, Improper body mechanics, Postural dysfunction  Visit Diagnosis: Unsteadiness on feet  Other abnormalities of gait and mobility  Hemiparesis (HCC)  G-Codes - 01/08/16 1216    Functional Assessment  Tool Used TUG: 52.54, BERG 19/56, S for gait up to 200'   Functional Limitation Mobility: Walking and moving around   Mobility: Walking and Moving Around Current Status [phone]) At least 40 percent but less than 60 percent impaired, limited or restricted   Mobility: Walking and Moving Around Goal Status [phone]) At least 1 percent but less than 20 percent impaired, limited or restricted     Physical Therapy Progress Note  Dates of Reporting Period: 12/04/15 to 01/08/16  Objective Reports of Subjective Statement: See above  Objective Measurements: BERG 19/56  Goal Update: See LTGs above  Plan: Continue POC  Reason Skilled Services are Required: She continues to demonstrate decreased balance, decreased RLE strength and coordination, decreased compliance with HEP.       Problem List Patient Active Problem List   Diagnosis Date Noted  . Somnolence, daytime 12/08/2015  . Cerebral thrombosis with cerebral infarction 09/07/2015  . Lacunar infarct, acute ([place])   . Nausea & vomiting 09/05/2015  . Acute kidney injury ([place]) 09/04/2015  . Type II diabetes mellitus with neurological manifestations, uncontrolled ([place]) 09/03/2015  . Bilateral lower extremity edema 09/03/2015  . Essential hypertension   . Diabetes mellitus type 2 in nonobese (HCC)   . History of breast cancer   . AKI (acute kidney injury) ([place])   . Acute blood loss anemia   . Hemiparesis ([place])   . Gait disturbance, post-stroke   . Dysphagia, post-stroke   . Acute ischemic stroke/right cerebellar 08/24/2015  . Breast cancer of upper-outer quadrant of right female breast ([place]) 04/30/2015  . CVA (cerebral infarction) 07/05/2014  . Hyperlipidemia 07/05/2014  . Syncope 07/04/2014  . Mild mitral regurgitation by prior echocardiogram 12/20/2011  . Diabetes mellitus type 1, uncontrolled, insulin dependent ([place]) 02/26/2009  . OBESITY 02/26/2009  . Uncontrolled hypertension 02/26/2009  . CAD (coronary artery disease) 02/26/2009   . GASTROESOPHAGEAL REFLUX DISEASE 02/26/2009    Denice Bors 01/08/2016, 12:33 PM  [facility] [address] [place], [place], [address] Phone: [phone]   Fax:  [phone]  Name: Glenda [name] MRN: [id number] Date of Birth: April 24, 1934

## 2016-01-08 NOTE — Therapy (Signed)
[facility] [address] [place], [place], [address] Phone: [phone]   Fax:  [phone]  Speech Language Pathology Treatment  Patient Details  Name: Glenda [name] MRN: [id number] Date of Birth: 07/18/1933 Referring Provider: Evlyn Courier, N.P.  Encounter Date: 01/08/2016      End of Session - 01/08/16 1538    Visit Number 6   Number of Visits 27   Date for SLP Re-Evaluation 02/20/16   Activity Tolerance Patient tolerated treatment well;Patient limited by fatigue      Past Medical History:  Diagnosis Date  . Arthritis   . Breast cancer ([place])   . CAD (coronary artery disease)   . Chest pain   . Dizziness   . DM (diabetes mellitus) ([place])   . GERD (gastroesophageal reflux disease)   . HLD (hyperlipidemia)   . HTN (hypertension)   . Obesity   . Radiation 09/11/15-10/13/15   42.72 Gy to right breast, 12 Gy boost to right breast  . Shortness of breath dyspnea   . Stroke ([place])   . UTI (urinary tract infection)     Past Surgical History:  Procedure Laterality Date  . CHOLECYSTECTOMY    . colon polyps; hx    . CORONARY ANGIOPLASTY  02/14/09  . ESOPHAGOGASTRODUODENOSCOPY (EGD) WITH PROPOFOL Left 09/08/2015   Procedure: ESOPHAGOGASTRODUODENOSCOPY (EGD) WITH PROPOFOL;  Surgeon: Clarene Essex, MD;  Location[facility] ENDOSCOPY;  Service: Endoscopy;  Laterality: Left;  . RADIOACTIVE SEED GUIDED MASTECTOMY WITH AXILLARY SENTINEL LYMPH NODE BIOPSY Right 07/17/2015   Procedure: RADIOACTIVE SEED GUIDED PARTIAL MASTECTOMY WITH AXILLARY SENTINEL LYMPH NODE BIOPSY;  Surgeon: Erroll Luna, MD;  Location: [facility];  Service: General;  Laterality: Right;    There were no vitals filed for this visit.      Subjective Assessment - 01/08/16 1457    Subjective "I worked on that list of words you gave me and the sentences from the man"   Patient is accompained by: Family member   Currently in Pain? No/denies                ADULT SLP TREATMENT - 01/08/16 0001      General Information   Behavior/Cognition Pleasant mood;Cooperative;Alert     Treatment Provided   Treatment provided Cognitive-Linquistic     Pain Assessment   Pain Assessment No/denies pain     Cognitive-Linquistic Treatment   Treatment focused on Dysarthria   Skilled Treatment Pt with improved dysarthria this date during structured sentence completion tasks and informal conversation given a topic; Intermittent min A at the sentence - conversational speech level to increase error awareness. Pt able to recall "open mouth" and "loud" for strategies with dysarthria during this session without cueing.  Reviewed other compensatory strategies for improved communicative competency.     Assessment / Recommendations / Plan   Plan Continue with current plan of care            SLP Short Term Goals - 01/08/16 1542      SLP SHORT TERM GOAL #1   Title pt will perform dysarthria HEP with occasional min A over three sessions   Time 2   Period Weeks   Status On-going     SLP SHORT TERM GOAL #2   Title pt will demo dysarthria compensations in 85% of sentences over three sessions   Baseline 01/05/16,    Time 2   Period Weeks   Status On-going          SLP Long  Term Goals - 01/08/16 1543      SLP LONG TERM GOAL #1   Title pt will perform HEP for dysarthria with rare min A over three sessions   Time 6   Period Weeks   Status On-going     SLP LONG TERM GOAL #2   Title pt will demo dysarthria compensations 60% of the time in simple conversation   Baseline 6   Period Weeks   Status On-going          Plan - 01/08/16 1540    Clinical Impression Statement Pt continues to make steady gains, but would benefit from continued speech therapy to improve communicative skills with family/caregivers in her community.   Speech Therapy Frequency 2x / week   Duration --  7 weeks   Treatment/Interventions Oral motor exercises;Compensatory  strategies;Functional tasks;Patient/family education;Cueing hierarchy;Multimodal communcation approach;Internal/external aids;SLP instruction and feedback   Potential to Achieve Goals Good   Consulted and Agree with Plan of Care Patient;Family member/caregiver   Family Member Consulted daughter      Patient will benefit from skilled therapeutic intervention in order to improve the following deficits and impairments:   Dysarthria and anarthria    Problem List Patient Active Problem List   Diagnosis Date Noted  . Somnolence, daytime 12/08/2015  . Cerebral thrombosis with cerebral infarction 09/07/2015  . Lacunar infarct, acute ([place])   . Nausea & vomiting 09/05/2015  . Acute kidney injury ([place]) 09/04/2015  . Type II diabetes mellitus with neurological manifestations, uncontrolled ([place]) 09/03/2015  . Bilateral lower extremity edema 09/03/2015  . Essential hypertension   . Diabetes mellitus type 2 in nonobese ([facility])   . History of breast cancer   . AKI (acute kidney injury) ([place])   . Acute blood loss anemia   . Hemiparesis ([place])   . Gait disturbance, post-stroke   . Dysphagia, post-stroke   . Acute ischemic stroke/right cerebellar 08/24/2015  . Breast cancer of upper-outer quadrant of right female breast ([place]) 04/30/2015  . CVA (cerebral infarction) 07/05/2014  . Hyperlipidemia 07/05/2014  . Syncope 07/04/2014  . Mild mitral regurgitation by prior echocardiogram 12/20/2011  . Diabetes mellitus type 1, uncontrolled, insulin dependent ([facility]) 02/26/2009  . OBESITY 02/26/2009  . Uncontrolled hypertension 02/26/2009  . CAD (coronary artery disease) 02/26/2009  . GASTROESOPHAGEAL REFLUX DISEASE 02/26/2009    Jamear Carbonneau,PAT 01/08/2016, 3:45 PM  [facility] [address] [place], [place], [address] Phone: [phone]   Fax:  [phone]   Name: Glenda [name] MRN: [id number] Date of Birth: Dec 09, 1933

## 2016-01-09 ENCOUNTER — Telehealth: Payer: Self-pay | Admitting: *Deleted

## 2016-01-09 NOTE — Telephone Encounter (Signed)
Left message on daughter's voicemail to cancel survivorship appointment but to keep appt. With Dr. Lindi Adie.

## 2016-01-12 ENCOUNTER — Encounter: Payer: Medicare Other | Admitting: Adult Health

## 2016-01-12 ENCOUNTER — Encounter: Payer: Self-pay | Admitting: Hematology and Oncology

## 2016-01-12 ENCOUNTER — Ambulatory Visit (HOSPITAL_BASED_OUTPATIENT_CLINIC_OR_DEPARTMENT_OTHER): Payer: Medicare Other | Admitting: Hematology and Oncology

## 2016-01-12 DIAGNOSIS — C50411 Malignant neoplasm of upper-outer quadrant of right female breast: Secondary | ICD-10-CM

## 2016-01-12 NOTE — Assessment & Plan Note (Signed)
Right lumpectomy 07/17/2015: IDC grade 2, 0.3 cm, with high-grade DCIS with comedonecrosis, DCIS focally less than 0.1 cm from posterior margin and broadly 0.2 cm from posterior margin, 0/1 lymph node negative, T1 aN0 stage IA, ER 0%, PR 0%, HER-2 negative ratio 0.93  Original right breast biopsy was DCIS ER/PR negative Adjuvant radiation therapy: 09/11/2015 to 10/13/2015  Current treatment: Surveillance Breast Cancer Surveillance: 1. Breast exam 01/12/2016: Normal 2. Mammogram to be done December 2017  Return to clinic in one year for surveillance and follow-up

## 2016-01-12 NOTE — Progress Notes (Signed)
Patient Care Team: L.Donnie Coffin, MD as PCP - General (Family Medicine) Erroll Luna, MD as Consulting Physician (General Surgery) Nicholas Lose, MD as Consulting Physician (Hematology and Oncology) Gery Pray, MD as Consulting Physician (Radiation Oncology)  DIAGNOSIS: Breast cancer of upper-outer quadrant of right female breast [facility])   Staging form: Breast, AJCC 7th Edition   - Clinical stage from 05/07/2015: Stage 0 (Tis (DCIS), N0, M0) - Unsigned         Staging comments: Staged at breast conference on 1.11.17  SUMMARY OF ONCOLOGIC HISTORY:   Breast cancer of upper-outer quadrant of right female breast ([place])   04/14/2015 Mammogram    Right breast asymmetry at 9:30 position 1.3 x 0.8 x 1.4 cm      04/24/2015 Initial Diagnosis    Right breast biopsy 9:30 position: DCIS high-grade with necrosis and calcifications,? Early invasive disease, ER 0% PR 0%      07/17/2015 Surgery    Right lumpectomy: IDC grade 2, 0.3 cm, with HG-DCIS with comedonecrosis, DCIS focally less than 0.1 cm from posterior margin and broadly 0.2 cm from posterior margin, 0/1 LN neg, T1 aN0 stage IA, ER 0%, PR 0%, Ki-67 5%, HER-2 neg ratio 0.93      09/11/2015 - 10/13/2015 Radiation Therapy    Right breast was treated with 42.72 Gy in 16 fractions with a boost of 12 Gy in 6 fractions.       CHIEF COMPLIANT: Follow-up after radiation is complete  INTERVAL HISTORY: Glenda [name] is a 82 year old with above-mentioned history of right breast cancer treated with lumpectomy and radiation. She is here today to discuss surveillance plan. She reports that she has recovered very well from radiation therapy. She has mild discomfort in the right breast were otherwise feeling well.  REVIEW OF SYSTEMS:   Constitutional: Denies fevers, chills or abnormal weight loss Eyes: Denies blurriness of vision Ears, nose, mouth, throat, and face: Denies mucositis or sore throat Respiratory: Denies cough, dyspnea or  wheezes Cardiovascular: Denies palpitation, chest discomfort Gastrointestinal:  Denies nausea, heartburn or change in bowel habits Skin: Denies abnormal skin rashes Lymphatics: Denies new lymphadenopathy or easy bruising Neurological:Denies numbness, tingling or new weaknesses Behavioral/Psych: Mood is stable, no new changes  Extremities: No lower extremity edema Breast:  denies any pain or lumps or nodules in either breasts All other systems were reviewed with the patient and are negative.  I have reviewed the past medical history, past surgical history, social history and family history with the patient and they are unchanged from previous note.  ALLERGIES:  has No Known Allergies.  MEDICATIONS:  Current Outpatient Prescriptions  Medication Sig Dispense Refill  . amLODipine (NORVASC) 10 MG tablet Take 10 mg by mouth daily. Reported on 10/31/2015    . aspirin 81 MG chewable tablet Chew 81 mg by mouth daily.    . clonazePAM (KLONOPIN) 0.5 MG tablet Take 0.5 tablets (0.25 mg total) by mouth 3 (three) times daily as needed for anxiety. 30 tablet 0  . cloNIDine (CATAPRES) 0.2 MG tablet Take 0.2 mg by mouth 2 (two) times daily as needed. BP > 160    . clopidogrel (PLAVIX) 75 MG tablet Take 75 mg by mouth daily.     . furosemide (LASIX) 80 MG tablet Take 80 mg by mouth daily as needed.     Marland Kitchen HUMALOG KWIKPEN 100 UNIT/ML KiwkPen Inject 22-34 Units into the skin 2 (two) times daily. Sliding scale as follows: 80-199 = 22 units 200-299 = 26 units 300-399 =  30 units >400 = 34 units  3  . hyaluronate sodium (RADIAPLEXRX) GEL Apply 1 application topically once. Reported on 10/31/2015    . nebivolol (BYSTOLIC) 10 MG tablet Take 20 mg by mouth daily.    . pantoprazole (PROTONIX) 40 MG tablet Take 40 mg by mouth 2 (two) times daily.    . potassium chloride (KLOR-CON) 10 MEQ CR tablet Take 10 mEq by mouth 2 (two) times daily. When taking lasix    . pravastatin (PRAVACHOL) 80 MG tablet Take 80 mg by mouth  daily.    Nelva Nay SOLOSTAR 300 UNIT/ML SOPN Inject 24 Units into the skin every morning.   11  . valsartan-hydrochlorothiazide (DIOVAN-HCT) 320-25 MG tablet Take 1 tablet by mouth daily.     No current facility-administered medications for this visit.     PHYSICAL EXAMINATION: ECOG PERFORMANCE STATUS: 2 - Symptomatic, <50% confined to bed  Vitals:   01/12/16 1021  BP: (!) 174/61  Pulse: 64  Resp: 17  Temp: 98.6 F (37 C)   Filed Weights   01/12/16 1021  Weight: 160 lb 9.6 oz (72.8 kg)    GENERAL:alert, no distress and comfortable SKIN: skin color, texture, turgor are normal, no rashes or significant lesions EYES: normal, Conjunctiva are pink and non-injected, sclera clear OROPHARYNX:no exudate, no erythema and lips, buccal mucosa, and tongue normal  NECK: supple, thyroid normal size, non-tender, without nodularity LYMPH:  no palpable lymphadenopathy in the cervical, axillary or inguinal LUNGS: clear to auscultation and percussion with normal breathing effort HEART: regular rate & rhythm and no murmurs and no lower extremity edema ABDOMEN:abdomen soft, non-tender and normal bowel sounds MUSCULOSKELETAL:no cyanosis of digits and no clubbing  NEURO: alert & oriented x 3 with fluent speech, no focal motor/sensory deficits EXTREMITIES: No lower extremity edema BREAST:The area of radiation on the breast, feels nodular and mildly tender (exam performed in the presence of a chaperone)  LABORATORY DATA:  I have reviewed the data as listed   Chemistry      Component Value Date/Time   NA 140 09/16/2015   NA 141 05/07/2015 1251   K 4.4 09/16/2015   K 4.3 05/07/2015 1251   CL 111 09/06/2015 0629   CO2 24 09/06/2015 0629   CO2 22 05/07/2015 1251   BUN 34 (A) 09/16/2015   BUN 22.5 05/07/2015 1251   CREATININE 1.7 (A) 09/16/2015   CREATININE 1.42 (H) 09/06/2015 0629   CREATININE 1.2 (H) 05/07/2015 1251   GLU 231 09/16/2015      Component Value Date/Time   CALCIUM 9.0  09/06/2015 0629   CALCIUM 9.1 05/07/2015 1251   ALKPHOS 100 09/16/2015   ALKPHOS 95 05/07/2015 1251   AST 17 09/16/2015   AST 15 05/07/2015 1251   ALT 3 (A) 09/16/2015   ALT <9 05/07/2015 1251   BILITOT 0.6 09/05/2015 0622   BILITOT 0.30 05/07/2015 1251       Lab Results  Component Value Date   WBC 5.3 09/16/2015   HGB 11.2 (A) 09/16/2015   HCT 36 09/16/2015   MCV 82.1 09/09/2015   PLT 268 09/16/2015   NEUTROABS 3 09/16/2015     ASSESSMENT & PLAN:  Breast cancer of upper-outer quadrant of right female breast ([place]) Right lumpectomy 07/17/2015: IDC grade 2, 0.3 cm, with high-grade DCIS with comedonecrosis, DCIS focally less than 0.1 cm from posterior margin and broadly 0.2 cm from posterior margin, 0/1 lymph node negative, T1 aN0 stage IA, ER 0%, PR 0%, HER-2 negative ratio 0.93  Original right breast biopsy was DCIS ER/PR negative Adjuvant radiation therapy: 09/11/2015 to 10/13/2015  Current treatment: Surveillance Breast Cancer Surveillance: 1. Breast exam 01/12/2016: Normal 2. Mammogram to be done December 2017  Return to clinic in one year for surveillance and follow-up      No orders of the defined types were placed in this encounter.  The patient has a good understanding of the overall plan. she agrees with it. she will call with any problems that may develop before the next visit here.   Rulon Eisenmenger, MD 01/12/16

## 2016-01-13 ENCOUNTER — Ambulatory Visit: Payer: Medicare Other | Admitting: Speech Pathology

## 2016-01-13 ENCOUNTER — Ambulatory Visit: Payer: Medicare Other | Admitting: Occupational Therapy

## 2016-01-13 ENCOUNTER — Ambulatory Visit: Payer: Medicare Other | Admitting: Physical Therapy

## 2016-01-13 ENCOUNTER — Encounter: Payer: Self-pay | Admitting: Physical Therapy

## 2016-01-13 DIAGNOSIS — R278 Other lack of coordination: Secondary | ICD-10-CM

## 2016-01-13 DIAGNOSIS — R41842 Visuospatial deficit: Secondary | ICD-10-CM

## 2016-01-13 DIAGNOSIS — R471 Dysarthria and anarthria: Secondary | ICD-10-CM

## 2016-01-13 DIAGNOSIS — R2681 Unsteadiness on feet: Secondary | ICD-10-CM

## 2016-01-13 DIAGNOSIS — G8190 Hemiplegia, unspecified affecting unspecified side: Secondary | ICD-10-CM

## 2016-01-13 DIAGNOSIS — M6281 Muscle weakness (generalized): Secondary | ICD-10-CM

## 2016-01-13 DIAGNOSIS — R2689 Other abnormalities of gait and mobility: Secondary | ICD-10-CM

## 2016-01-13 NOTE — Therapy (Signed)
[facility] [address] [place], [place], [address] Phone: [phone]   Fax:  [phone]  Speech Language Pathology Treatment  Patient Details  Name: Glenda [name] MRN: [id number] Date of Birth: 1933/05/27 Referring Provider: Evlyn Courier, N.P.  Encounter Date: 01/13/2016      End of Session - 01/13/16 1450    Visit Number 7   Number of Visits 27   Date for SLP Re-Evaluation 02/20/16   SLP Start Time 75   SLP Stop Time  1440   SLP Time Calculation (min) 38 min      Past Medical History:  Diagnosis Date  . Arthritis   . Breast cancer ([place])   . CAD (coronary artery disease)   . Chest pain   . Dizziness   . DM (diabetes mellitus) ([place])   . GERD (gastroesophageal reflux disease)   . HLD (hyperlipidemia)   . HTN (hypertension)   . Obesity   . Radiation 09/11/15-10/13/15   42.72 Gy to right breast, 12 Gy boost to right breast  . Shortness of breath dyspnea   . Stroke ([place])   . UTI (urinary tract infection)     Past Surgical History:  Procedure Laterality Date  . CHOLECYSTECTOMY    . colon polyps; hx    . CORONARY ANGIOPLASTY  02/14/09  . ESOPHAGOGASTRODUODENOSCOPY (EGD) WITH PROPOFOL Left 09/08/2015   Procedure: ESOPHAGOGASTRODUODENOSCOPY (EGD) WITH PROPOFOL;  Surgeon: Clarene Essex, MD;  Location: [facility] ENDOSCOPY;  Service: Endoscopy;  Laterality: Left;  . RADIOACTIVE SEED GUIDED MASTECTOMY WITH AXILLARY SENTINEL LYMPH NODE BIOPSY Right 07/17/2015   Procedure: RADIOACTIVE SEED GUIDED PARTIAL MASTECTOMY WITH AXILLARY SENTINEL LYMPH NODE BIOPSY;  Surgeon: Erroll Luna, MD;  Location: [facility];  Service: General;  Laterality: Right;    There were no vitals filed for this visit.      Subjective Assessment - 01/13/16 1403    Subjective "He gave a list of words to practice"   Currently in Pain? No/denies               ADULT SLP TREATMENT - 01/13/16 1414      General Information   Behavior/Cognition Pleasant mood;Cooperative;Alert     Treatment Provided   Treatment provided Cognitive-Linquistic     Pain Assessment   Pain Assessment No/denies pain     Cognitive-Linquistic Treatment   Treatment focused on Dysarthria   Skilled Treatment Compensations for dysarthria in structured task of repeating multisyllable words then generating sentence with the word - pt required rare min A to use slow rate and overartciulation. Simple conversation is intellgibile with rare min A for compensations     Assessment / Recommendations / Plan   Plan Continue with current plan of care     Progression Toward Goals   Progression toward goals Progressing toward goals            SLP Short Term Goals - 01/13/16 1444      SLP SHORT TERM GOAL #1   Title pt will perform dysarthria HEP with occasional min A over three sessions   Time 1   Period Weeks   Status On-going     SLP SHORT TERM GOAL #2   Title pt will demo dysarthria compensations in 85% of sentences over three sessions   Baseline 01/05/16, 01/13/16    Time 1   Period Weeks   Status On-going          SLP Long Term Goals - 01/13/16 1445  SLP LONG TERM GOAL #1   Title pt will perform HEP for dysarthria with rare min A over three sessions   Time 6   Period Weeks   Status On-going     SLP LONG TERM GOAL #2   Title pt will demo dysarthria compensations 60% of the time in simple conversation   Baseline 6   Period Weeks   Status On-going          Plan - 01/13/16 1444    Clinical Impression Statement Pt continues to make steady gains, but would benefit from continued speech therapy to improve communicative skills with family/caregivers in her community.   Speech Therapy Frequency 2x / week   Treatment/Interventions Oral motor exercises;Compensatory strategies;Functional tasks;Patient/family education;Cueing hierarchy;Multimodal communcation approach;Internal/external aids;SLP instruction and feedback    Potential to Achieve Goals Good   Potential Considerations Ability to learn/carryover information   Consulted and Agree with Plan of Care Patient;Family member/caregiver      Patient will benefit from skilled therapeutic intervention in order to improve the following deficits and impairments:   Dysarthria and anarthria    Problem List Patient Active Problem List   Diagnosis Date Noted  . Somnolence, daytime 12/08/2015  . Cerebral thrombosis with cerebral infarction 09/07/2015  . Lacunar infarct, acute ([place])   . Nausea & vomiting 09/05/2015  . Acute kidney injury ([place]) 09/04/2015  . Type II diabetes mellitus with neurological manifestations, uncontrolled ([place]) 09/03/2015  . Bilateral lower extremity edema 09/03/2015  . Essential hypertension   . Diabetes mellitus type 2 in nonobese ([facility])   . History of breast cancer   . AKI (acute kidney injury) ([place])   . Acute blood loss anemia   . Hemiparesis ([place])   . Gait disturbance, post-stroke   . Dysphagia, post-stroke   . Acute ischemic stroke/right cerebellar 08/24/2015  . Breast cancer of upper-outer quadrant of right female breast ([place]) 04/30/2015  . CVA (cerebral infarction) 07/05/2014  . Hyperlipidemia 07/05/2014  . Syncope 07/04/2014  . Mild mitral regurgitation by prior echocardiogram 12/20/2011  . Diabetes mellitus type 1, uncontrolled, insulin dependent ([place]) 02/26/2009  . OBESITY 02/26/2009  . Uncontrolled hypertension 02/26/2009  . CAD (coronary artery disease) 02/26/2009  . GASTROESOPHAGEAL REFLUX DISEASE 02/26/2009    Lovvorn, Annye Rusk MS, CCC-SLP 01/13/2016, 2:51 PM  [facility] [address] [place], [place], [address] Phone: [phone]   Fax:  [phone]   Name: Glenda [name] MRN: [id number] Date of Birth: 03-Apr-1934

## 2016-01-13 NOTE — Therapy (Signed)
[facility] [address] [place], [place], [address] Phone: [phone]   Fax:  [phone]  Occupational Therapy Treatment  Patient Details  Name: Glenda [name] MRN: [id number] Date of Birth: [birth year] Referring Provider: Dr. Donnie Coffin  Encounter Date: 01/13/2016      OT End of Session - 01/13/16 1526    Visit Number 17   Number of Visits 32   Date for OT Re-Evaluation 03/07/16   Authorization Type UHC MCR- short term goals due 02/06/16   Authorization - Visit Number 54   Authorization - Number of Visits 20   OT Start Time 1448   OT Stop Time 1530   OT Time Calculation (min) 42 min   Activity Tolerance Patient tolerated treatment well   Behavior During Therapy [facility] for tasks assessed/performed      Past Medical History:  Diagnosis Date  . Arthritis   . Breast cancer ([place])   . CAD (coronary artery disease)   . Chest pain   . Dizziness   . DM (diabetes mellitus) ([place])   . GERD (gastroesophageal reflux disease)   . HLD (hyperlipidemia)   . HTN (hypertension)   . Obesity   . Radiation 09/11/15-10/13/15   42.72 Gy to right breast, 12 Gy boost to right breast  . Shortness of breath dyspnea   . Stroke ([place])   . UTI (urinary tract infection)     Past Surgical History:  Procedure Laterality Date  . CHOLECYSTECTOMY    . colon polyps; hx    . CORONARY ANGIOPLASTY  02/14/09  . ESOPHAGOGASTRODUODENOSCOPY (EGD) WITH PROPOFOL Left 09/08/2015   Procedure: ESOPHAGOGASTRODUODENOSCOPY (EGD) WITH PROPOFOL;  Surgeon: Clarene Essex, MD;  Location: [facility] ENDOSCOPY;  Service: Endoscopy;  Laterality: Left;  . RADIOACTIVE SEED GUIDED MASTECTOMY WITH AXILLARY SENTINEL LYMPH NODE BIOPSY Right 07/17/2015   Procedure: RADIOACTIVE SEED GUIDED PARTIAL MASTECTOMY WITH AXILLARY SENTINEL LYMPH NODE BIOPSY;  Surgeon: Erroll Luna, MD;  Location: [facility];  Service: General;  Laterality: Right;    There were no vitals filed for this  visit.         Treatment: environmental scanning activity, pt required mod/ max v.c. To locate items to both sides during ambulation, 1 rest break halfway through task due to fatigue. Fucntional reaching in standing to plce items in overhead cabinets, min v.c., minguard for balance Placing grooved pegs into pegboard with RUE for increased fine motor coordination, mod difficulty                       OT Short Term Goals - 01/08/16 1541      OT SHORT TERM GOAL #1   Title I with inital HEP.   Time 4   Period Weeks   Status Achieved     OT SHORT TERM GOAL #2   Title Pt will demonstrate ability to retrieve a lightweight object at 110* with RUE.   Time 4   Period Weeks   Status --  100*     OT SHORT TERM GOAL #3   Title Pt will demonstrate improved fine motor coordination as evidenced by decreasing 9 hole peg test score by 4 secs bilaterally.   Status Partially Met  met for LUE only 36.40, RUE 75.72, 69.09     OT SHORT TERM GOAL #4   Title Pt will perform simple snack prep or light home management task in standing with min A without LOB demonstrating good safety awareness.   Time 4  Period Weeks   Status Achieved     OT SHORT TERM GOAL #5   Title Pt will demonstrate ability to write a short paragraph with 95% legibility.   Time 4   Period Weeks   Status On-going  not consistent, 80-95% legibility     Additional Short Term Goals   Additional Short Term Goals --     OT SHORT TERM GOAL #6   Title Further assess cognition and set goal PRN   Time 4   Period Weeks   Status Deferred     OT SHORT TERM GOAL #7   Title Pt will demonstrate ability to navigate a busy environment and locate items with 80% accuracy.   Time 4   Period Weeks   Status New     OT SHORT TERM GOAL #8   Title Pt demonstrate improved fine motor coordination as evidenced by perfoming 9 hole peg test in 63 secs or less with RUE    Baseline 75.72, 69.09   Time 4   Period Weeks    Status New           OT Long Term Goals - 01/08/16 1551      OT LONG TERM GOAL #1   Title I with updated HEP.   Time 8   Period Weeks   Status On-going  previous HEP still appropriate      OT LONG TERM GOAL #2   Title Pt will perform simple cooking task in standing with supervision demonstrating good safety awareness.- Revised goal-Pt will perform simple cooking task in standing with min A.   Time 8   Period Weeks   Status Revised  not fully addressed due to pt's decline since TIA     OT LONG TERM GOAL #3   Title Pt will demonstrate ability to retrieve a light weight object at 120 shoulder flexion with RUE   Time 8   Period Weeks   Status On-going  95-100     OT LONG TERM GOAL #4   Title Pt will resume use of RUE as dominant hand at least 75% of the time for ADLs/IADLs.   Time 8   Period Weeks   Status On-going  50%     OT LONG TERM GOAL #5   Title Pt will decrease RUE 9 hole peg test score to 49 secs from for increased fine motor coordination. - Revised pt will complete 9 hole peg test in 54 secs or less with RUE.   Status Revised  75.72, 69.09     OT LONG TERM GOAL #6   Title Pt will be modified indpendent with all basic ADLs.   Time 8   Period Weeks   Status Achieved  except for supervision with shower transfers               Plan - 01/13/16 1513    Clinical Impression Statement Pt is progressing towards goals. She continues to be limited by RUE weakness and visual perceptual deficits.   Rehab Potential Good   OT Frequency 2x / week   OT Duration 8 weeks   OT Treatment/Interventions Self-care/ADL training;Therapeutic exercise;Patient/family education;Functional Mobility Training;Neuromuscular education;Manual Therapy;Therapeutic exercises;DME and/or AE instruction;Therapeutic activities;Electrical Stimulation;Fluidtherapy;Cognitive remediation/compensation;Moist Heat;Passive range of motion;Visual/perceptual remediation/compensation   Plan continue  to work towards goals   Consulted and Agree with Plan of Care Patient   Family Member Consulted daughter      Patient will benefit from skilled therapeutic intervention in order to improve the following  deficits and impairments:  Abnormal gait, Decreased coordination, Decreased range of motion, Difficulty walking, Decreased safety awareness, Decreased endurance, Decreased activity tolerance, Decreased knowledge of precautions, Impaired UE functional use, Decreased knowledge of use of DME, Decreased balance, Decreased cognition, Decreased mobility, Decreased strength, Impaired perceived functional ability  Visit Diagnosis: Other lack of coordination  Muscle weakness (generalized)  Visuospatial deficit    Problem List Patient Active Problem List   Diagnosis Date Noted  . Somnolence, daytime 12/08/2015  . Cerebral thrombosis with cerebral infarction 09/07/2015  . Lacunar infarct, acute ([place])   . Nausea & vomiting 09/05/2015  . Acute kidney injury ([place]) 09/04/2015  . Type II diabetes mellitus with neurological manifestations, uncontrolled ([place]) 09/03/2015  . Bilateral lower extremity edema 09/03/2015  . Essential hypertension   . Diabetes mellitus type 2 in nonobese (H[facility])   . History of breast cancer   . AKI (acute kidney injury) ([place])   . Acute blood loss anemia   . Hemiparesis ([place])   . Gait disturbance, post-stroke   . Dysphagia, post-stroke   . Acute ischemic stroke/right cerebellar 08/24/2015  . Breast cancer of upper-outer quadrant of right female breast ([place]) 04/30/2015  . CVA (cerebral infarction) 07/05/2014  . Hyperlipidemia 07/05/2014  . Syncope 07/04/2014  . Mild mitral regurgitation by prior echocardiogram 12/20/2011  . Diabetes mellitus type 1, uncontrolled, insulin dependent ([place]) 02/26/2009  . OBESITY 02/26/2009  . Uncontrolled hypertension 02/26/2009  . CAD (coronary artery disease) 02/26/2009  . GASTROESOPHAGEAL REFLUX DISEASE 02/26/2009     Loyce Klasen 01/13/2016, 3:27 PM  [facility] [address] [place], [place], [address] Phone: [phone]   Fax:  [phone]  Name: Glenda [name] MRN: [id number] Date of Birth: [birth year]

## 2016-01-15 ENCOUNTER — Encounter: Payer: Medicare Other | Admitting: Speech Pathology

## 2016-01-15 ENCOUNTER — Ambulatory Visit: Payer: Medicare Other | Admitting: Occupational Therapy

## 2016-01-15 DIAGNOSIS — R278 Other lack of coordination: Secondary | ICD-10-CM

## 2016-01-15 DIAGNOSIS — R2681 Unsteadiness on feet: Secondary | ICD-10-CM

## 2016-01-15 DIAGNOSIS — M6281 Muscle weakness (generalized): Secondary | ICD-10-CM

## 2016-01-15 DIAGNOSIS — R41842 Visuospatial deficit: Secondary | ICD-10-CM

## 2016-01-15 NOTE — Therapy (Signed)
[facility] [address] [place], [place], [address] Phone: [phone]   Fax:  [phone]  Physical Therapy Treatment  Patient Details  Name: Glenda [name] MRN: [id number] Date of Birth: [birth year] Referring Provider: Donnie Coffin, MD  Encounter Date: 01/13/2016    01/13/16 1325  PT Visits / Re-Eval  Visit Number 21  Number of Visits 25  Date for PT Re-Evaluation 01/29/16  Authorization  Authorization Type UHC MCR- G code needed every 10th visit  PT Time Calculation  PT Start Time 1318  PT Stop Time 1400  PT Time Calculation (min) 42 min  PT - End of Session  Equipment Utilized During Treatment Gait belt  Activity Tolerance Patient tolerated treatment well;Patient limited by fatigue  Behavior During Therapy [facility] for tasks assessed/performed     Past Medical History:  Diagnosis Date  . Arthritis   . Breast cancer ([place])   . CAD (coronary artery disease)   . Chest pain   . Dizziness   . DM (diabetes mellitus) ([place])   . GERD (gastroesophageal reflux disease)   . HLD (hyperlipidemia)   . HTN (hypertension)   . Obesity   . Radiation 09/11/15-10/13/15   42.72 Gy to right breast, 12 Gy boost to right breast  . Shortness of breath dyspnea   . Stroke ([place])   . UTI (urinary tract infection)     Past Surgical History:  Procedure Laterality Date  . CHOLECYSTECTOMY    . colon polyps; hx    . CORONARY ANGIOPLASTY  02/14/09  . ESOPHAGOGASTRODUODENOSCOPY (EGD) WITH PROPOFOL Left 09/08/2015   Procedure: ESOPHAGOGASTRODUODENOSCOPY (EGD) WITH PROPOFOL;  Surgeon: Clarene Essex, MD;  Location: [facility] ENDOSCOPY;  Service: Endoscopy;  Laterality: Left;  . RADIOACTIVE SEED GUIDED MASTECTOMY WITH AXILLARY SENTINEL LYMPH NODE BIOPSY Right 07/17/2015   Procedure: RADIOACTIVE SEED GUIDED PARTIAL MASTECTOMY WITH AXILLARY SENTINEL LYMPH NODE BIOPSY;  Surgeon: Erroll Luna, MD;  Location: [facility];  Service: General;  Laterality: Right;     There were no vitals filed for this visit.     01/13/16 1321  Symptoms/Limitations  Subjective No new complaints. No pain. Had a fall on Thursday. Had a fall on Thursday. Was transfering from wheelchair to recliner and reports her legs "went numb".  [address] forward onto floor. Daughter helped her up. Pt was at 90* angle to recliner and was reaching forward to far arm rest of recliner while standing to get up, how she fell forward. Denies any injury. Daughter present today and reporting pt need's help with getting out of car as well.                                        Patient is accompained by: Family member  Limitations Walking;House hold activities  Patient Stated Goals "I want to work on walking"   Pain Assessment  Currently in Pain? No/denies  Pain Score 0      01/13/16 1326  Transfers  Transfers Sit to Stand;Stand to Sit  Sit to Stand 4: Min guard;5: Supervision  Sit to Stand Details Verbal cues for sequencing;Verbal cues for precautions/safety;Verbal cues for safe use of DME/AE  Stand to Sit 4: Min guard;5: Supervision  Stand to Sit Details (indicate cue type and reason) Verbal cues for precautions/safety;Verbal cues for safe use of DME/AE  Stand Pivot Transfers 4: Min guard;5: Supervision  Stand Pivot Transfer Details (indicate cue type and  reason) blocked practice from wheelchair<>standard chair with arm rests to simulate chair pt uses at home. cues on sequencing/technique  Comments performed sit<>stand x 2 reps from passenger side of car with min guard assist progressing to supervision with cues on hand placement and technique.                                         Ambulation/Gait  Ambulation/Gait Yes  Ambulation/Gait Assistance 4: Min guard  Ambulation/Gait Assistance Details cues on increased step/stride length and walker position with gait  Ambulation Distance (Feet) 200 Feet  Assistive device Rolling walker  Gait Pattern Step-through pattern;Decreased stride  length;Decreased step length - right;Decreased step length - left;Decreased stance time - right;Decreased hip/knee flexion - right;Decreased weight shift to right;Shuffle;Ataxic;Trunk flexed;Narrow base of support;Poor foot clearance - right  Ambulation Surface Level;Unlevel;Indoor;Outdoor;Paved  Therapeutic Activites   Other Therapeutic Activities practiced getting into/out of car with cues on sequencing and technique. pt able to effectively return demo in session.  Neuro Re-ed   Neuro Re-ed Details  standing next to mat table with RW in front for safety: EO progressing to [facility] for short periods of time (~10 sec's each), EO head movements left<>right and up<>down. min to mod assist for balance.                           PT Short Term Goals - 12/03/15 1107      PT SHORT TERM GOAL #1   Title Pt will initiate HEP for strengthening and balance in order to improve functional mobility.  (Target Date: 11/28/15)   Baseline 12/03/15: met today after reinstruction with pictures to daughter/pt   Status Achieved     PT SHORT TERM GOAL #2   Title Pt will improve TUG score to <80 seconds in order to indicate decreased fall risk.     Baseline 11/25/15: 52.44 sec's with RW and simulated toe cap    Status Achieved     PT SHORT TERM GOAL #3   Title Will assess BERG and improve score by 4 points in order to indicate decreased fall risk and functional improvement in balance.     Baseline 11/27/15: 17/56 today (was 12/56 at baseline)   Status Achieved     PT SHORT TERM GOAL #4   Title Pt will ambulate over indoor surfaces x 150' w/ RW at S level in order to indicate safe negotiation in home.     Baseline 11/25/15: pt able to ambulate this distance with varied amout of assistance (min guard to min assist)   Status Partially Met     PT SHORT TERM GOAL #5   Title Pt will verbalize fall prevention strategies in order to indicate decreased fall risk inside and outside of home.     Baseline 12/03/15: fall prevention  strategies issued today with review with pt and daughter   Status Partially Met     PT SHORT TERM GOAL #6   Title Pt will perform 8/10 sit <>stands with single UE support only at mod I level in order to indicate improved functional strength.     Baseline 12/03/15: pt continues to need min>mod assist to power up and to control descent with sitting down using 1-2 UE's to assist.   Status Not Met           PT Long Term Goals -  01/08/16 1215      PT LONG TERM GOAL #1   Title Pt will be independent with HEP in order to indicate improved functional mobility and decreased fall risk.  (Modified Target Date: 01/29/16)   Baseline met per pt and daughter's report - reports doing them "occasionally"   Status Achieved     PT LONG TERM GOAL #2   Title Pt will improve TUG score to <40 secs in order to indicate decreased fall risk.     Baseline 47.47 secs with RW-made on-going to continue to work towards goal.    Status On-going     PT LONG TERM GOAL #3   Title Pt will improve BERG balance score 8 points from baseline in order to indicate decreased fall risk.     Baseline 10/56 on 12/25/15   Status On-going     PT LONG TERM GOAL #4   Title Pt will perform 8/10 sit<>stand without UE support at mod I level in order to indicate improved functional strength.     Baseline pt performed 10 reps sit to stand with RUE support - unable to perform without UE   Status On-going     PT LONG TERM GOAL #5   Title Pt will ambulate 300' w/ LRAD over paved outdoor surfaces at mod I level in order to indicate safe return to community.     Baseline Pt only able to ambulate 115' over indoor surfaces with min/guard to min A, therefore unsafe to address outdoor gait at this time.     Status On-going     PT LONG TERM GOAL #6   Title Pt will negotiate up/down 5 steps w/ single rail at mod I level in order to indicate safe entry into home/family's homes.     Status On-going        01/13/16 1325  Plan  Clinical  Impression Statement today's session addressed transfer safety and gait working towards goals. Pt is making steady progress and should benefit from continued PT to progress toward unmet goals.  Pt will benefit from skilled therapeutic intervention in order to improve on the following deficits Abnormal gait;Decreased activity tolerance;Decreased balance;Decreased coordination;Decreased endurance;Decreased knowledge of use of DME;Decreased mobility;Decreased strength;Dizziness;Increased edema;Impaired perceived functional ability;Impaired flexibility;Impaired UE functional use;Impaired vision/preception;Improper body mechanics;Postural dysfunction  PT Frequency 2x / week  PT Duration 4 weeks  PT Treatment/Interventions ADLs/Self Care Home Management;Electrical Stimulation;DME Instruction;Gait training;Stair training;Functional mobility training;Therapeutic activities;Therapeutic exercise;Balance training;Neuromuscular re-education;Patient/family education;Orthotic Fit/Training;Vestibular;Visual/perceptual remediation/compensation  PT Next Visit Plan Continue to work on gait (including barriers), strengthening and balance toward goals of recert (primary PT to complete recert)  Consulted and Agree with Plan of Care Patient;Family member/caregiver  Family Member Consulted daughter,        Patient will benefit from skilled therapeutic intervention in order to improve the following deficits and impairments:  Abnormal gait, Decreased activity tolerance, Decreased balance, Decreased coordination, Decreased endurance, Decreased knowledge of use of DME, Decreased mobility, Decreased strength, Dizziness, Increased edema, Impaired perceived functional ability, Impaired flexibility, Impaired UE functional use, Impaired vision/preception, Improper body mechanics, Postural dysfunction  Visit Diagnosis: Unsteadiness on feet  Other abnormalities of gait and mobility  Hemiparesis ([facility]CC)  Other lack of  coordination     Problem List Patient Active Problem List   Diagnosis Date Noted  . Somnolence, daytime 12/08/2015  . Cerebral thrombosis with cerebral infarction 09/07/2015  . Lacunar infarct, acute ([place])   . Nausea & vomiting 09/05/2015  . Acute kidney injury ([place]) 09/04/2015  . Type  II diabetes mellitus with neurological manifestations, uncontrolled ([place]) 09/03/2015  . Bilateral lower extremity edema 09/03/2015  . Essential hypertension   . Diabetes mellitus type 2 in nonobese (HCC)   . History of breast cancer   . AKI (acute kidney injury) ([place])   . Acute blood loss anemia   . Hemiparesis ([place])   . Gait disturbance, post-stroke   . Dysphagia, post-stroke   . Acute ischemic stroke/right cerebellar 08/24/2015  . Breast cancer of upper-outer quadrant of right female breast ([place]) 04/30/2015  . CVA (cerebral infarction) 07/05/2014  . Hyperlipidemia 07/05/2014  . Syncope 07/04/2014  . Mild mitral regurgitation by prior echocardiogram 12/20/2011  . Diabetes mellitus type 1, uncontrolled, insulin dependent ([place]) 02/26/2009  . OBESITY 02/26/2009  . Uncontrolled hypertension 02/26/2009  . CAD (coronary artery disease) 02/26/2009  . GASTROESOPHAGEAL REFLUX DISEASE 02/26/2009    Willow Ora, PTA, [facility] [address] [place], [place] [address] [phone] 01/15/16, 12:50 PM   Name: Glenda [name] MRN: [id number] Date of Birth: [birth year]

## 2016-01-15 NOTE — Therapy (Signed)
[facility] [address] [place], [place], [address] Phone: [phone]   Fax:  [phone]  Occupational Therapy Treatment  Patient Details  Name: Glenda [name] MRN: [id number] Date of Birth: 1933/09/16 Referring Provider: Dr. Donnie Coffin  Encounter Date: 01/15/2016      OT End of Session - 01/15/16 1634    Visit Number 18   Number of Visits 32   Date for OT Re-Evaluation 03/07/16   Authorization Type [employer] MCR- short term goals due 02/06/16   Authorization - Visit Number 18   Authorization - Number of Visits 20   OT Start Time 1287   OT Stop Time 1530   OT Time Calculation (min) 45 min   Activity Tolerance Patient tolerated treatment well      Past Medical History:  Diagnosis Date  . Arthritis   . Breast cancer ([facility])   . CAD (coronary artery disease)   . Chest pain   . Dizziness   . DM (diabetes mellitus) ([facility])   . GERD (gastroesophageal reflux disease)   . HLD (hyperlipidemia)   . HTN (hypertension)   . Obesity   . Radiation 09/11/15-10/13/15   42.72 Gy to right breast, 12 Gy boost to right breast  . Shortness of breath dyspnea   . Stroke ([facility])   . UTI (urinary tract infection)     Past Surgical History:  Procedure Laterality Date  . CHOLECYSTECTOMY    . colon polyps; hx    . CORONARY ANGIOPLASTY  02/14/09  . ESOPHAGOGASTRODUODENOSCOPY (EGD) WITH PROPOFOL Left 09/08/2015   Procedure: ESOPHAGOGASTRODUODENOSCOPY (EGD) WITH PROPOFOL;  Surgeon: Clarene Essex, MD;  Location: [facility] ENDOSCOPY;  Service: Endoscopy;  Laterality: Left;  . RADIOACTIVE SEED GUIDED MASTECTOMY WITH AXILLARY SENTINEL LYMPH NODE BIOPSY Right 07/17/2015   Procedure: RADIOACTIVE SEED GUIDED PARTIAL MASTECTOMY WITH AXILLARY SENTINEL LYMPH NODE BIOPSY;  Surgeon: Erroll Luna, MD;  Location: [facility];  Service: General;  Laterality: Right;    There were no vitals filed for this visit.      Subjective Assessment - 01/15/16 1459    Subjective  I have more energy today because you are first today   Pertinent History CVA 08/24/15   Patient Stated Goals To do everything I did before   Currently in Pain? No/denies                      OT Treatments/Exercises (OP) - 01/15/16 0001      ADLs   ADL Comments Pt/daughter issued handout on walker tray and told where/how to purchase     Visual/Perceptual Exercises   Scanning Environmental   Scanning - Environmental Pt scanning for cards in environment missing 6 on Lt, 1 on Rt and walker running into chairs on Lt side on occasion. Pt required one rest break     Functional Reaching Activities   High Level seated at tabletop - pt placing rubber rings on prongs on elevated surface RUE with 2 rest breaks d/t fatigue in RUE. Reviewed cane ex's in shoulder flexion and abduction                  OT Short Term Goals - 01/08/16 1541      OT SHORT TERM GOAL #1   Title I with inital HEP.   Time 4   Period Weeks   Status Achieved     OT SHORT TERM GOAL #2   Title Pt will demonstrate ability to retrieve a lightweight object at  110* with RUE.   Time 4   Period Weeks   Status --  100*     OT SHORT TERM GOAL #3   Title Pt will demonstrate improved fine motor coordination as evidenced by decreasing 9 hole peg test score by 4 secs bilaterally.   Status Partially Met  met for LUE only 36.40, RUE 75.72, 69.09     OT SHORT TERM GOAL #4   Title Pt will perform simple snack prep or light home management task in standing with min A without LOB demonstrating good safety awareness.   Time 4   Period Weeks   Status Achieved     OT SHORT TERM GOAL #5   Title Pt will demonstrate ability to write a short paragraph with 95% legibility.   Time 4   Period Weeks   Status On-going  not consistent, 80-95% legibility     Additional Short Term Goals   Additional Short Term Goals --     OT SHORT TERM GOAL #6   Title Further assess cognition and set goal PRN   Time  4   Period Weeks   Status Deferred     OT SHORT TERM GOAL #7   Title Pt will demonstrate ability to navigate a busy environment and locate items with 80% accuracy.   Time 4   Period Weeks   Status New     OT SHORT TERM GOAL #8   Title Pt demonstrate improved fine motor coordination as evidenced by perfoming 9 hole peg test in 63 secs or less with RUE    Baseline 75.72, 69.09   Time 4   Period Weeks   Status New           OT Long Term Goals - 01/08/16 1551      OT LONG TERM GOAL #1   Title I with updated HEP.   Time 8   Period Weeks   Status On-going  previous HEP still appropriate      OT LONG TERM GOAL #2   Title Pt will perform simple cooking task in standing with supervision demonstrating good safety awareness.- Revised goal-Pt will perform simple cooking task in standing with min A.   Time 8   Period Weeks   Status Revised  not fully addressed due to pt's decline since TIA     OT LONG TERM GOAL #3   Title Pt will demonstrate ability to retrieve a light weight object at 120 shoulder flexion with RUE   Time 8   Period Weeks   Status On-going  95-100     OT LONG TERM GOAL #4   Title Pt will resume use of RUE as dominant hand at least 75% of the time for ADLs/IADLs.   Time 8   Period Weeks   Status On-going  50%     OT LONG TERM GOAL #5   Title Pt will decrease RUE 9 hole peg test score to 49 secs from for increased fine motor coordination. - Revised pt will complete 9 hole peg test in 54 secs or less with RUE.   Status Revised  75.72, 69.09     OT LONG TERM GOAL #6   Title Pt will be modified indpendent with all basic ADLs.   Time 8   Period Weeks   Status Achieved  except for supervision with shower transfers               Plan - 01/15/16 1635    Clinical Impression  Statement Pt progressing gradually towards goals. She continues to be limited in RUE weakness, endurance, balance, and visual perceptual deficits.    Rehab Potential Good   OT  Frequency 2x / week   OT Duration 8 weeks   OT Treatment/Interventions Self-care/ADL training;Therapeutic exercise;Patient/family education;Functional Mobility Training;Neuromuscular education;Manual Therapy;Therapeutic exercises;DME and/or AE instruction;Therapeutic activities;Electrical Stimulation;Fluidtherapy;Cognitive remediation/compensation;Moist Heat;Passive range of motion;Visual/perceptual remediation/compensation   Plan continue RUE coordination, endurance, ROM, visual perceptual skills      Patient will benefit from skilled therapeutic intervention in order to improve the following deficits and impairments:  Abnormal gait, Decreased coordination, Decreased range of motion, Difficulty walking, Decreased safety awareness, Decreased endurance, Decreased activity tolerance, Decreased knowledge of precautions, Impaired UE functional use, Decreased knowledge of use of DME, Decreased balance, Decreased cognition, Decreased mobility, Decreased strength, Impaired perceived functional ability  Visit Diagnosis: Other lack of coordination  Muscle weakness (generalized)  Visuospatial deficit  Unsteadiness on feet    Problem List Patient Active Problem List   Diagnosis Date Noted  . Somnolence, daytime 12/08/2015  . Cerebral thrombosis with cerebral infarction 09/07/2015  . Lacunar infarct, acute ([facility])   . Nausea & vomiting 09/05/2015  . Acute kidney injury ([facility]) 09/04/2015  . Type II diabetes mellitus with neurological manifestations, uncontrolled ([facility]) 09/03/2015  . Bilateral lower extremity edema 09/03/2015  . Essential hypertension   . Diabetes mellitus type 2 in nonobese ([facility])   . History of breast cancer   . AKI (acute kidney injury) ([facility])   . Acute blood loss anemia   . Hemiparesis ([facility])   . Gait disturbance, post-stroke   . Dysphagia, post-stroke   . Acute ischemic stroke/right cerebellar 08/24/2015  . Breast cancer of upper-outer quadrant of right female breast ([facility])  04/30/2015  . CVA (cerebral infarction) 07/05/2014  . Hyperlipidemia 07/05/2014  . Syncope 07/04/2014  . Mild mitral regurgitation by prior echocardiogram 12/20/2011  . Diabetes mellitus type 1, uncontrolled, insulin dependent ([facility]) 02/26/2009  . OBESITY 02/26/2009  . Uncontrolled hypertension 02/26/2009  . CAD (coronary artery disease) 02/26/2009  . GASTROESOPHAGEAL REFLUX DISEASE 02/26/2009    Carey Bullocks, OTR/L 01/15/2016, 4:37 PM  [facility] [address] [place], [place], [address] Phone: [phone]   Fax:  [phone]  Name: Bethel Sirois MRN: [id number] Date of Birth: 03-03-1934

## 2016-01-20 ENCOUNTER — Ambulatory Visit: Payer: Medicare Other | Admitting: Occupational Therapy

## 2016-01-20 ENCOUNTER — Ambulatory Visit: Payer: Medicare Other | Admitting: Speech Pathology

## 2016-01-20 DIAGNOSIS — R41842 Visuospatial deficit: Secondary | ICD-10-CM

## 2016-01-20 DIAGNOSIS — R278 Other lack of coordination: Secondary | ICD-10-CM

## 2016-01-20 DIAGNOSIS — M6281 Muscle weakness (generalized): Secondary | ICD-10-CM

## 2016-01-20 DIAGNOSIS — R471 Dysarthria and anarthria: Secondary | ICD-10-CM

## 2016-01-20 NOTE — Therapy (Signed)
[facility] [address] [place], [place], [address] Phone: [phone]   Fax:  [phone]  Occupational Therapy Treatment  Patient Details  Name: Glenda [name] MRN: [id number] Date of Birth: [birth year] Referring Provider: Dr. Donnie Coffin  Encounter Date: 01/20/2016      OT End of Session - 01/20/16 1703    Visit Number 19   Number of Visits 32   Authorization Type U[employer] MCR- short term goals due 02/06/16   Authorization - Visit Number 47   Authorization - Number of Visits 20      Past Medical History:  Diagnosis Date  . Arthritis   . Breast cancer ([facility])   . CAD (coronary artery disease)   . Chest pain   . Dizziness   . DM (diabetes mellitus) ([facility])   . GERD (gastroesophageal reflux disease)   . HLD (hyperlipidemia)   . HTN (hypertension)   . Obesity   . Radiation 09/11/15-10/13/15   42.72 Gy to right breast, 12 Gy boost to right breast  . Shortness of breath dyspnea   . Stroke ([facility])   . UTI (urinary tract infection)     Past Surgical History:  Procedure Laterality Date  . CHOLECYSTECTOMY    . colon polyps; hx    . CORONARY ANGIOPLASTY  02/14/09  . ESOPHAGOGASTRODUODENOSCOPY (EGD) WITH PROPOFOL Left 09/08/2015   Procedure: ESOPHAGOGASTRODUODENOSCOPY (EGD) WITH PROPOFOL;  Surgeon: Clarene Essex, MD;  Location[facility] ENDOSCOPY;  Service: Endoscopy;  Laterality: Left;  . RADIOACTIVE SEED GUIDED MASTECTOMY WITH AXILLARY SENTINEL LYMPH NODE BIOPSY Right 07/17/2015   Procedure: RADIOACTIVE SEED GUIDED PARTIAL MASTECTOMY WITH AXILLARY SENTINEL LYMPH NODE BIOPSY;  Surgeon: Erroll Luna, MD;  Location: [facility];  Service: General;  Laterality: Right;    There were no vitals filed for this visit.      Subjective Assessment - 01/20/16 1707    Pertinent History CVA 08/24/15   Patient Stated Goals To do everything I did before   Currently in Pain? Yes   Pain Score 3    Pain Location Foot   Pain Orientation Right   Pain Descriptors / Indicators Aching   Pain Type Acute pain   Pain Onset In the past 7 days   Pain Frequency Intermittent   Aggravating Factors  swelling   Pain Relieving Factors rest   Multiple Pain Sites No       Treatment: standing to place various sized pegs in semicircle with RUE, min difficulty, mod v.c. To maintain weight through right side Standing for dynamic reaching to place remove items from overhead shelf and place on a surface to the right side requiring lateral weight shift, mod v.c. Facilitation. Pt required several rest breaks due to fatigue. Ambulating while performing environmental scanning, pt required min v.c. To attend to right.                         OT Short Term Goals - 01/08/16 1541      OT SHORT TERM GOAL #1   Title I with inital HEP.   Time 4   Period Weeks   Status Achieved     OT SHORT TERM GOAL #2   Title Pt will demonstrate ability to retrieve a lightweight object at 110* with RUE.   Time 4   Period Weeks   Status --  100*     OT SHORT TERM GOAL #3   Title Pt will demonstrate improved fine motor coordination as evidenced  by decreasing 9 hole peg test score by 4 secs bilaterally.   Status Partially Met  met for LUE only 36.40, RUE 75.72, 69.09     OT SHORT TERM GOAL #4   Title Pt will perform simple snack prep or light home management task in standing with min A without LOB demonstrating good safety awareness.   Time 4   Period Weeks   Status Achieved     OT SHORT TERM GOAL #5   Title Pt will demonstrate ability to write a short paragraph with 95% legibility.   Time 4   Period Weeks   Status On-going  not consistent, 80-95% legibility     Additional Short Term Goals   Additional Short Term Goals --     OT SHORT TERM GOAL #6   Title Further assess cognition and set goal PRN   Time 4   Period Weeks   Status Deferred     OT SHORT TERM GOAL #7   Title Pt will demonstrate ability to navigate a busy environment  and locate items with 80% accuracy.   Time 4   Period Weeks   Status New     OT SHORT TERM GOAL #8   Title Pt demonstrate improved fine motor coordination as evidenced by perfoming 9 hole peg test in 63 secs or less with RUE    Baseline 75.72, 69.09   Time 4   Period Weeks   Status New           OT Long Term Goals - 01/08/16 1551      OT LONG TERM GOAL #1   Title I with updated HEP.   Time 8   Period Weeks   Status On-going  previous HEP still appropriate      OT LONG TERM GOAL #2   Title Pt will perform simple cooking task in standing with supervision demonstrating good safety awareness.- Revised goal-Pt will perform simple cooking task in standing with min A.   Time 8   Period Weeks   Status Revised  not fully addressed due to pt's decline since TIA     OT LONG TERM GOAL #3   Title Pt will demonstrate ability to retrieve a light weight object at 120 shoulder flexion with RUE   Time 8   Period Weeks   Status On-going  95-100     OT LONG TERM GOAL #4   Title Pt will resume use of RUE as dominant hand at least 75% of the time for ADLs/IADLs.   Time 8   Period Weeks   Status On-going  50%     OT LONG TERM GOAL #5   Title Pt will decrease RUE 9 hole peg test score to 49 secs from for increased fine motor coordination. - Revised pt will complete 9 hole peg test in 54 secs or less with RUE.   Status Revised  75.72, 69.09     OT LONG TERM GOAL #6   Title Pt will be modified indpendent with all basic ADLs.   Time 8   Period Weeks   Status Achieved  except for supervision with shower transfers               Plan - 01/20/16 1702    Clinical Impression Statement Pt is progressing towards goals. she remeins limited by decreased right side awareness/ weakness, and cognitive deficits.   Rehab Potential Good   OT Frequency 2x / week   OT Duration 8 weeks  OT Treatment/Interventions Self-care/ADL training;Therapeutic exercise;Patient/family  education;Functional Mobility Training;Neuromuscular education;Manual Therapy;Therapeutic exercises;DME and/or AE instruction;Therapeutic activities;Electrical Stimulation;Fluidtherapy;Cognitive remediation/compensation;Moist Heat;Passive range of motion;Visual/perceptual remediation/compensation   Plan environmental scanning, simulate laundry, G code   Consulted and Agree with Plan of Care Patient   Family Member Consulted daughter      Patient will benefit from skilled therapeutic intervention in order to improve the following deficits and impairments:  Abnormal gait, Decreased coordination, Decreased range of motion, Difficulty walking, Decreased safety awareness, Decreased endurance, Decreased activity tolerance, Decreased knowledge of precautions, Impaired UE functional use, Decreased knowledge of use of DME, Decreased balance, Decreased cognition, Decreased mobility, Decreased strength, Impaired perceived functional ability  Visit Diagnosis: Other lack of coordination  Muscle weakness (generalized)  Visuospatial deficit    Problem List Patient Active Problem List   Diagnosis Date Noted  . Somnolence, daytime 12/08/2015  . Cerebral thrombosis with cerebral infarction 09/07/2015  . Lacunar infarct, acute ([facility])   . Nausea & vomiting 09/05/2015  . Acute kidney injury ([facility]) 09/04/2015  . Type II diabetes mellitus with neurological manifestations, uncontrolled ([facility]) 09/03/2015  . Bilateral lower extremity edema 09/03/2015  . Essential hypertension   . Diabetes mellitus type 2 in nonobese ([facility])   . History of breast cancer   . AKI (acute kidney injury) ([facility])   . Acute blood loss anemia   . Hemiparesis ([facility])   . Gait disturbance, post-stroke   . Dysphagia, post-stroke   . Acute ischemic stroke/right cerebellar 08/24/2015  . Breast cancer of upper-outer quadrant of right female breast ([facility]) 04/30/2015  . CVA (cerebral infarction) 07/05/2014  . Hyperlipidemia 07/05/2014  . Syncope  07/04/2014  . Mild mitral regurgitation by prior echocardiogram 12/20/2011  . Diabetes mellitus type 1, uncontrolled, insulin dependent ([facility]) 02/26/2009  . OBESITY 02/26/2009  . Uncontrolled hypertension 02/26/2009  . CAD (coronary artery disease) 02/26/2009  . GASTROESOPHAGEAL REFLUX DISEASE 02/26/2009    RINE,KATHRYN 01/20/2016, 5:08 PM  [facility] [address] [place], [place], [address] Phone: [phone]   Fax:  [phone]  Name: Daneen Volcy MRN: [id number] Date of Birth: 07/20/1933

## 2016-01-22 ENCOUNTER — Ambulatory Visit: Payer: Medicare Other

## 2016-01-22 ENCOUNTER — Ambulatory Visit: Payer: Medicare Other | Admitting: Rehabilitation

## 2016-01-22 ENCOUNTER — Ambulatory Visit: Payer: Medicare Other | Admitting: Occupational Therapy

## 2016-01-22 ENCOUNTER — Encounter: Payer: Medicare Other | Admitting: Speech Pathology

## 2016-01-22 DIAGNOSIS — R2681 Unsteadiness on feet: Secondary | ICD-10-CM

## 2016-01-22 DIAGNOSIS — M6281 Muscle weakness (generalized): Secondary | ICD-10-CM

## 2016-01-22 DIAGNOSIS — R41842 Visuospatial deficit: Secondary | ICD-10-CM

## 2016-01-22 DIAGNOSIS — R278 Other lack of coordination: Secondary | ICD-10-CM

## 2016-01-22 DIAGNOSIS — G8190 Hemiplegia, unspecified affecting unspecified side: Secondary | ICD-10-CM

## 2016-01-22 DIAGNOSIS — R2689 Other abnormalities of gait and mobility: Secondary | ICD-10-CM

## 2016-01-22 NOTE — Therapy (Signed)
Glenda [name] [address] [place], [place], [address] Phone: [phone]   Fax:  [phone]  Occupational Therapy Treatment  Patient Details  Name: Glenda [name] MRN: [id number] Date of Birth: 1933-05-23 Referring Provider: Dr. Donnie Coffin  Encounter Date: 01/22/2016      OT End of Session - 01/22/16 1517    Visit Number 20   Number of Visits 32   Date for OT Re-Evaluation 03/07/16   Authorization Type UHC MCR- short term goals due 02/06/16   Authorization Time Period G code next visit!   Authorization - Visit Number 20   Authorization - Number of Visits 30      Past Medical History:  Diagnosis Date  . Arthritis   . Breast cancer ([place])   . CAD (coronary artery disease)   . Chest pain   . Dizziness   . DM (diabetes mellitus) ([place])   . GERD (gastroesophageal reflux disease)   . HLD (hyperlipidemia)   . HTN (hypertension)   . Obesity   . Radiation 09/11/15-10/13/15   42.72 Gy to right breast, 12 Gy boost to right breast  . Shortness of breath dyspnea   . Stroke ([place])   . UTI (urinary tract infection)     Past Surgical History:  Procedure Laterality Date  . CHOLECYSTECTOMY    . colon polyps; hx    . CORONARY ANGIOPLASTY  02/14/09  . ESOPHAGOGASTRODUODENOSCOPY (EGD) WITH PROPOFOL Left 09/08/2015   Procedure: ESOPHAGOGASTRODUODENOSCOPY (EGD) WITH PROPOFOL;  Surgeon: Clarene Essex, MD;  Location: [facility] ENDOSCOPY;  Service: Endoscopy;  Laterality: Left;  . RADIOACTIVE SEED GUIDED MASTECTOMY WITH AXILLARY SENTINEL LYMPH NODE BIOPSY Right 07/17/2015   Procedure: RADIOACTIVE SEED GUIDED PARTIAL MASTECTOMY WITH AXILLARY SENTINEL LYMPH NODE BIOPSY;  Surgeon: Erroll Luna, MD;  Location: [facility];  Service: General;  Laterality: Right;    There were no vitals filed for this visit.      Subjective Assessment - 01/22/16 1507    Subjective  Pt reports she is tired   Pertinent History CVA 08/24/15   Patient Stated  Goals To do everything I did before   Currently in Pain? No/denies         Treatment: Pt ambulated from waiting room to treatment room with with 1 significant LOB requiring therapist assist to recover, min A and mod v.c. To advance RLE. Pt reports she is very fatigued as has been active all day, and had PT earlier. Seated placing grooved pegs in pegboard with RUE , min-mod difficulty for increased fine motor coordination. Functional mid range reaching to place washers on various height dowels with RUE, min v.c. Discussion with pt/ daughter that's t's RUE weakness is mainly related to recent CVA/ TIA, pt did not have as pronounced RUE weakness and therefore weakness is likely not caused by recent lumpectomy. Therapist reinforced that gentle exercise for RUE is fine however she should avoid needlesticks and BP cuff use for RUE..                       OT Short Term Goals - 01/22/16 1630      OT SHORT TERM GOAL #1   Title I with inital HEP.   Time 4   Period Weeks   Status Achieved     OT SHORT TERM GOAL #2   Title Pt will demonstrate ability to retrieve a lightweight object at 110* with RUE.   Time 4   Period Weeks   Status On-going  100*     OT SHORT TERM GOAL #3   Title Pt will demonstrate improved fine motor coordination as evidenced by decreasing 9 hole peg test score by 4 secs bilaterally.   Status Partially Met  met for LUE only 36.40, RUE 75.72, 69.09     OT SHORT TERM GOAL #4   Title Pt will perform simple snack prep or light home management task in standing with min A without LOB demonstrating good safety awareness.   Time 4   Period Weeks   Status Achieved     OT SHORT TERM GOAL #5   Title Pt will demonstrate ability to write a short paragraph with 95% legibility.   Time 4   Period Weeks   Status On-going  not consistent, 80-95% legibility     OT SHORT TERM GOAL #6   Title Further assess cognition and set goal PRN   Time 4   Period Weeks    Status Deferred     OT SHORT TERM GOAL #7   Title Pt will demonstrate ability to navigate a busy environment and locate items with 80% accuracy.   Time 4   Period Weeks   Status On-going     OT SHORT TERM GOAL #8   Title Pt demonstrate improved fine motor coordination as evidenced by perfoming 9 hole peg test in 63 secs or less with RUE    Baseline 75.72, 69.09   Time 4   Period Weeks   Status Achieved  54.37 secs           OT Long Term Goals - 01/08/16 1551      OT LONG TERM GOAL #1   Title I with updated HEP.   Time 8   Period Weeks   Status On-going  previous HEP still appropriate      OT LONG TERM GOAL #2   Title Pt will perform simple cooking task in standing with supervision demonstrating good safety awareness.- Revised goal-Pt will perform simple cooking task in standing with min A.   Time 8   Period Weeks   Status Revised  not fully addressed due to pt's decline since TIA     OT LONG TERM GOAL #3   Title Pt will demonstrate ability to retrieve a light weight object at 120 shoulder flexion with RUE   Time 8   Period Weeks   Status On-going  95-100     OT LONG TERM GOAL #4   Title Pt will resume use of RUE as dominant hand at least 75% of the time for ADLs/IADLs.   Time 8   Period Weeks   Status On-going  50%     OT LONG TERM GOAL #5   Title Pt will decrease RUE 9 hole peg test score to 49 secs from for increased fine motor coordination. - Revised pt will complete 9 hole peg test in 54 secs or less with RUE.   Status Revised  75.72, 69.09     OT LONG TERM GOAL #6   Title Pt will be modified indpendent with all basic ADLs.   Time 8   Period Weeks   Status Achieved  except for supervision with shower transfers               Plan - 01/22/16 1511    Clinical Impression Statement Pt is progressing towards goals. She remains limited by decreased right side awareness, right side weakness, cognitive and balance deficits which impede performance  of ADLs/IADLS.  Rehab Potential Good   OT Frequency 2x / week   OT Duration 8 weeks   OT Treatment/Interventions Self-care/ADL training;Therapeutic exercise;Patient/family education;Functional Mobility Training;Neuromuscular education;Manual Therapy;Therapeutic exercises;DME and/or AE instruction;Therapeutic activities;Electrical Stimulation;Fluidtherapy;Cognitive remediation/compensation;Moist Heat;Passive range of motion;Visual/perceptual remediation/compensation   Plan simulate laundry, environmental scanning   Consulted and Agree with Plan of Care Patient      Patient will benefit from skilled therapeutic intervention in order to improve the following deficits and impairments:  Abnormal gait, Decreased coordination, Decreased range of motion, Difficulty walking, Decreased safety awareness, Decreased endurance, Decreased activity tolerance, Decreased knowledge of precautions, Impaired UE functional use, Decreased knowledge of use of DME, Decreased balance, Decreased cognition, Decreased mobility, Decreased strength, Impaired perceived functional ability  Visit Diagnosis: Other lack of coordination  Muscle weakness (generalized)  Visuospatial deficit      G-Codes - 02-18-2016 1522    Functional Assessment Tool Used 9 hole peg test RUE 54.37, LUE 33.37 supervsion with basic ADLS except for assist with donning brace   Functional Limitation Self care   Self Care Current Status (O9629) At least 40 percent but less than 60 percent impaired, limited or restricted   Self Care Goal Status (B2841) At least 20 percent but less than 40 percent impaired, limited or restricted     Occupational Therapy Progress Note  Dates of Reporting Period: 12/11/15 to 2016/02/18  Objective Reports of Subjective Statement:  See above  Objective Measurements: see above  Goal Update: Pt is progressing towards goals, yet progress has been limited by right inattention, cognitive deficits and decreased  balance.  Plan: Continue to work towards unmet goals  Reason Skilled Services are Required: Pt can benefit from continued skilled occupational therapy to address: right inattention, decreased balance, decreased strength and coordination and cognitive deficits in order to maximize safety and independence with ADLs/IADLs. Problem List Patient Active Problem List   Diagnosis Date Noted  . Somnolence, daytime 12/08/2015  . Cerebral thrombosis with cerebral infarction 09/07/2015  . Lacunar infarct, acute ([place])   . Nausea & vomiting 09/05/2015  . Acute kidney injury ([place]) 09/04/2015  . Type II diabetes mellitus with neurological manifestations, uncontrolled ([place]) 09/03/2015  . Bilateral lower extremity edema 09/03/2015  . Essential hypertension   . Diabetes mellitus type 2 in nonobese ([facility])   . History of breast cancer   . AKI (acute kidney injury) ([place])   . Acute blood loss anemia   . Hemiparesis ([place])   . Gait disturbance, post-stroke   . Dysphagia, post-stroke   . Acute ischemic stroke/right cerebellar 08/24/2015  . Breast cancer of upper-outer quadrant of right female breast ([place]) 04/30/2015  . CVA (cerebral infarction) 07/05/2014  . Hyperlipidemia 07/05/2014  . Syncope 07/04/2014  . Mild mitral regurgitation by prior echocardiogram 12/20/2011  . Diabetes mellitus type 1, uncontrolled, insulin dependent ([place]) 02/26/2009  . OBESITY 02/26/2009  . Uncontrolled hypertension 02/26/2009  . CAD (coronary artery disease) 02/26/2009  . GASTROESOPHAGEAL REFLUX DISEASE 02/26/2009    RINE,KATHRYN 2016-02-18, 4:32 PM Theone Murdoch, OTR/L [phone] Phone: [phone] 4:32 PM 02/18/2016 [facility] [address] [place], [place], [address] Phone: [phone]   Fax:  [phone]  Name: Ellin Fitzgibbons MRN: [id number] Date of Birth: 1933-10-15

## 2016-01-22 NOTE — Therapy (Signed)
[facility] [address] [place], [place], [address] Phone: [phone]   Fax:  [phone]  Physical Therapy Treatment  Patient Details  Name: Glenda [name] MRN: [id number] Date of Birth: [birth year] Referring Provider: Donnie Coffin, MD  Encounter Date: 01/22/2016      PT End of Session - 01/22/16 1322    Visit Number 22   Number of Visits 25   Date for PT Re-Evaluation 01/29/16   Authorization Type UHC MCR- G code needed every 10th visit   PT Start Time 1316   PT Stop Time 1400   PT Time Calculation (min) 44 min   Equipment Utilized During Treatment Gait belt   Activity Tolerance Patient tolerated treatment well;Patient limited by fatigue   Behavior During Therapy [facility] for tasks assessed/performed      Past Medical History:  Diagnosis Date  . Arthritis   . Breast cancer ([place])   . CAD (coronary artery disease)   . Chest pain   . Dizziness   . DM (diabetes mellitus) ([place])   . GERD (gastroesophageal reflux disease)   . HLD (hyperlipidemia)   . HTN (hypertension)   . Obesity   . Radiation 09/11/15-10/13/15   42.72 Gy to right breast, 12 Gy boost to right breast  . Shortness of breath dyspnea   . Stroke ([place])   . UTI (urinary tract infection)     Past Surgical History:  Procedure Laterality Date  . CHOLECYSTECTOMY    . colon polyps; hx    . CORONARY ANGIOPLASTY  02/14/09  . ESOPHAGOGASTRODUODENOSCOPY (EGD) WITH PROPOFOL Left 09/08/2015   Procedure: ESOPHAGOGASTRODUODENOSCOPY (EGD) WITH PROPOFOL;  Surgeon: Clarene Essex, MD;  Location: [facility] ENDOSCOPY;  Service: Endoscopy;  Laterality: Left;  . RADIOACTIVE SEED GUIDED MASTECTOMY WITH AXILLARY SENTINEL LYMPH NODE BIOPSY Right 07/17/2015   Procedure: RADIOACTIVE SEED GUIDED PARTIAL MASTECTOMY WITH AXILLARY SENTINEL LYMPH NODE BIOPSY;  Surgeon: Erroll Luna, MD;  Location: [facility];  Service: General;  Laterality: Right;    There were no vitals filed for this  visit.      Subjective Assessment - 01/22/16 1320    Subjective Pt reports no changes, no complaints. NO falls   Limitations Walking;House hold activities   Patient Stated Goals "I want to work on walking"    Currently in Pain? No/denies                         [facility] Adult PT Treatment/Exercise - 01/22/16 1345      Ambulation/Gait   Ambulation/Gait Yes   Ambulation/Gait Assistance 4: Min guard;4: Min assist   Ambulation/Gait Assistance Details Performed ambulation over indoor and paved outdoor areas in order to address gait and endurance goals.  Note that she continues to have decreased balance, R inattention, decreased ability to fully clear RLE, and decresaed L step length.  Also continues to fatigue very quickly.  Tolerated gait x 200' and another 67' from indoors to outdoors and back in with seated rest break needed.      Ambulation Distance (Feet) 200 Feet  x1 and another 110' x 1   Assistive device Rolling walker   Gait Pattern Step-through pattern;Decreased stride length;Decreased step length - right;Decreased step length - left;Decreased stance time - right;Decreased hip/knee flexion - right;Decreased weight shift to right;Shuffle;Ataxic;Trunk flexed;Narrow base of support;Poor foot clearance - right   Ambulation Surface Level;Unlevel;Indoor;Outdoor;Paved   Stairs Assistance 4: Min assist  Min A from PT posteriorly, daughter assisting with RW  Stairs Assistance Details (indicate cue type and reason) Daughter requesting to practice stairs with use of RW in case they are out or at someone's house that doesn't have rails but has stairs. Performed backwards with RW, PT assisting at hips for stability and daughter assisting maintaining stability of RW.  Cues for sequencing and technique as well as RW placement.     Stair Management Technique No rails;Backwards;Forwards;With walker   Number of Stairs 4   Height of Stairs 6     Self-Care   Self-Care Other Self-Care  Comments   Other Self-Care Comments  While taking seated rest break, discussed with pt and daughter beginning walking program at home to increase ambulation distance.  Discussed beginning with 5 mins at a time, 3x/day, ensuring to pay attention to time so that we can actively build endurance.  Educated to add one minute when this time gets easier.       Neuro Re-ed    Neuro Re-ed Details  Worked on gait without AD in order to address gait speed, stride length, and midline orientation.  Performed 50' gait in this manner with R HHA with assist at trunk for increased R lateral weight shift as she tended to have L lateral lean but able to correct with cues.  Cues for posture and increased stride length.                  PT Education - 01/22/16 1321    Education provided Yes   Education Details education on starting walking program to improve endurance.    Person(s) Educated Patient   Methods Explanation   Comprehension Verbalized understanding          PT Short Term Goals - 12/03/15 1107      PT SHORT TERM GOAL #1   Title Pt will initiate HEP for strengthening and balance in order to improve functional mobility.  (Target Date: 11/28/15)   Baseline 12/03/15: met today after reinstruction with pictures to daughter/pt   Status Achieved     PT SHORT TERM GOAL #2   Title Pt will improve TUG score to <80 seconds in order to indicate decreased fall risk.     Baseline 11/25/15: 52.44 sec's with RW and simulated toe cap    Status Achieved     PT SHORT TERM GOAL #3   Title Will assess BERG and improve score by 4 points in order to indicate decreased fall risk and functional improvement in balance.     Baseline 11/27/15: 17/56 today (was 12/56 at baseline)   Status Achieved     PT SHORT TERM GOAL #4   Title Pt will ambulate over indoor surfaces x 150' w/ RW at S level in order to indicate safe negotiation in home.     Baseline 11/25/15: pt able to ambulate this distance with varied amout of  assistance (min guard to min assist)   Status Partially Met     PT SHORT TERM GOAL #5   Title Pt will verbalize fall prevention strategies in order to indicate decreased fall risk inside and outside of home.     Baseline 12/03/15: fall prevention strategies issued today with review with pt and daughter   Status Partially Met     PT SHORT TERM GOAL #6   Title Pt will perform 8/10 sit <>stands with single UE support only at mod I level in order to indicate improved functional strength.     Baseline 12/03/15: pt continues to need min>mod assist to  power up and to control descent with sitting down using 1-2 UE's to assist.   Status Not Met           PT Long Term Goals - 01/08/16 1215      PT LONG TERM GOAL #1   Title Pt will be independent with HEP in order to indicate improved functional mobility and decreased fall risk.  (Modified Target Date: 01/29/16)   Baseline met per pt and daughter's report - reports doing them "occasionally"   Status Achieved     PT LONG TERM GOAL #2   Title Pt will improve TUG score to <40 secs in order to indicate decreased fall risk.     Baseline 47.47 secs with RW-made on-going to continue to work towards goal.    Status On-going     PT LONG TERM GOAL #3   Title Pt will improve BERG balance score 8 points from baseline in order to indicate decreased fall risk.     Baseline 10/56 on 12/25/15   Status On-going     PT LONG TERM GOAL #4   Title Pt will perform 8/10 sit<>stand without UE support at mod I level in order to indicate improved functional strength.     Baseline pt performed 10 reps sit to stand with RUE support - unable to perform without UE   Status On-going     PT LONG TERM GOAL #5   Title Pt will ambulate 300' w/ LRAD over paved outdoor surfaces at mod I level in order to indicate safe return to community.     Baseline Pt only able to ambulate 115' over indoor surfaces with min/guard to min A, therefore unsafe to address outdoor gait at this  time.     Status On-going     PT LONG TERM GOAL #6   Title Pt will negotiate up/down 5 steps w/ single rail at mod I level in order to indicate safe entry into home/family's homes.     Status On-going               Plan - 01/22/16 1322    Clinical Impression Statement Skilled session focused on gait with RW over indoor and paved outdoor surfaces, negotiation of curb step, and stairs with use of RW in case of stairs without rail in community setting.    PT Frequency 2x / week   PT Duration 4 weeks   PT Treatment/Interventions ADLs/Self Care Home Management;Electrical Stimulation;DME Instruction;Gait training;Stair training;Functional mobility training;Therapeutic activities;Therapeutic exercise;Balance training;Neuromuscular re-education;Patient/family education;Orthotic Fit/Training;Vestibular;Visual/perceptual remediation/compensation   PT Next Visit Plan Continue to work on gait (including barriers), strengthening and balance toward goals of recert (primary PT to complete recert)   Consulted and Agree with Plan of Care Patient;Family member/caregiver   Family Member Consulted daughter,       Patient will benefit from skilled therapeutic intervention in order to improve the following deficits and impairments:  Abnormal gait, Decreased activity tolerance, Decreased balance, Decreased coordination, Decreased endurance, Decreased knowledge of use of DME, Decreased mobility, Decreased strength, Dizziness, Increased edema, Impaired perceived functional ability, Impaired flexibility, Impaired UE functional use, Impaired vision/preception, Improper body mechanics, Postural dysfunction  Visit Diagnosis: Hemiparesis ([place])  Other abnormalities of gait and mobility  Unsteadiness on feet  Muscle weakness (generalized)     Problem List Patient Active Problem List   Diagnosis Date Noted  . Somnolence, daytime 12/08/2015  . Cerebral thrombosis with cerebral infarction 09/07/2015  .  Lacunar infarct, acute ([place])   . Nausea &  vomiting 09/05/2015  . Acute kidney injury ([place]) 09/04/2015  . Type II diabetes mellitus with neurological manifestations, uncontrolled ([place]) 09/03/2015  . Bilateral lower extremity edema 09/03/2015  . Essential hypertension   . Diabetes mellitus type 2 in nonobese ([facility])   . History of breast cancer   . AKI (acute kidney injury) ([place])   . Acute blood loss anemia   . Hemiparesis ([place])   . Gait disturbance, post-stroke   . Dysphagia, post-stroke   . Acute ischemic stroke/right cerebellar 08/24/2015  . Breast cancer of upper-outer quadrant of right female breast ([place]) 04/30/2015  . CVA (cerebral infarction) 07/05/2014  . Hyperlipidemia 07/05/2014  . Syncope 07/04/2014  . Mild mitral regurgitation by prior echocardiogram 12/20/2011  . Diabetes mellitus type 1, uncontrolled, insulin dependent ([place]) 02/26/2009  . OBESITY 02/26/2009  . Uncontrolled hypertension 02/26/2009  . CAD (coronary artery disease) 02/26/2009  . GASTROESOPHAGEAL REFLUX DISEASE 02/26/2009    Cameron Sprang, PT, MPT [facility] [address] [place], [place], [address] Phone: [phone]   Fax:  [phone] 01/22/16, 4:08 PM  Name: Glenda [name] MRN: [id number] Date of Birth: 09-27-1933

## 2016-01-22 NOTE — Therapy (Signed)
[facility] [address] [place], [place], [address] Phone: [phone]   Fax:  [phone]  Speech Language Pathology Treatment  Patient Details  Name: Glenda [name] MRN: [id number] Date of Birth: [birth year] Referring Provider: Evlyn Courier, N.P.  Encounter Date: 01/20/2016      End of Session - 01/22/16 0731    Visit Number 8   Number of Visits 27   Date for SLP Re-Evaluation 02/20/16      Past Medical History:  Diagnosis Date  . Arthritis   . Breast cancer ([place])   . CAD (coronary artery disease)   . Chest pain   . Dizziness   . DM (diabetes mellitus) ([place])   . GERD (gastroesophageal reflux disease)   . HLD (hyperlipidemia)   . HTN (hypertension)   . Obesity   . Radiation 09/11/15-10/13/15   42.72 Gy to right breast, 12 Gy boost to right breast  . Shortness of breath dyspnea   . Stroke ([place])   . UTI (urinary tract infection)     Past Surgical History:  Procedure Laterality Date  . CHOLECYSTECTOMY    . colon polyps; hx    . CORONARY ANGIOPLASTY  02/14/09  . ESOPHAGOGASTRODUODENOSCOPY (EGD) WITH PROPOFOL Left 09/08/2015   Procedure: ESOPHAGOGASTRODUODENOSCOPY (EGD) WITH PROPOFOL;  Surgeon: Clarene Essex, MD;  Location: [facility] ENDOSCOPY;  Service: Endoscopy;  Laterality: Left;  . RADIOACTIVE SEED GUIDED MASTECTOMY WITH AXILLARY SENTINEL LYMPH NODE BIOPSY Right 07/17/2015   Procedure: RADIOACTIVE SEED GUIDED PARTIAL MASTECTOMY WITH AXILLARY SENTINEL LYMPH NODE BIOPSY;  Surgeon: Erroll Luna, MD;  Location: [facility];  Service: General;  Laterality: Right;    There were no vitals filed for this visit.      Subjective Assessment - 01/22/16 0730    Subjective "Everyone is understanding me"   Patient is accompained by: Family member               ADULT SLP TREATMENT - 01/22/16 0730      General Information   Behavior/Cognition Pleasant mood;Cooperative;Alert     Cognitive-Linquistic Treatment   Treatment focused on Dysarthria   Skilled Treatment Facilitated compensations for dysarthria in structured speech tasks with supervision cues. Simple conversation 100% intelligibile. Daughter reports pt is speaking successfully outside of therapy.      Assessment / Recommendations / Plan   Plan Discharge SLP treatment due to (comment)     Progression Toward Goals   Progression toward goals Goals met, education completed, patient discharged from [facility]  Visits from Start of Care: 8  Current functional level related to goals / functional outcomes: See goals below   Remaining deficits: Dysarthria may increase with fatigue   Education / Equipment: Compensations for dysarthria, HEP for dysarthria Plan: Patient agrees to discharge.  Patient goals were met. Patient is being discharged due to meeting the stated rehab goals.  ?????            SLP Short Term Goals - 01/20/16 1426      SLP SHORT TERM GOAL #1   Title pt will perform dysarthria HEP with occasional min A over three sessions   Time 1   Period Weeks   Status Achieved     SLP SHORT TERM GOAL #2   Title pt will demo dysarthria compensations in 85% of sentences over three sessions   Baseline 01/05/16, 01/13/16    Time 1   Period Weeks   Status Achieved  SLP Long Term Goals - 01/20/16 1427      SLP LONG TERM GOAL #1   Title pt will perform HEP for dysarthria with rare min A over three sessions   Baseline 01/20/16;    Time 5   Period Weeks   Status Achieved     SLP LONG TERM GOAL #2   Title pt will demo dysarthria compensations 60% of the time in simple conversation   Baseline 5   Period Weeks  Achieved          Plan - 01/22/16 0730    Clinical Impression Statement Pt is 100% intellgible in conversation, recommend pt be d/c'd from ST - daughter is in agreement    Speech Therapy Frequency 2x / week   Treatment/Interventions Oral motor exercises;Compensatory  strategies;Functional tasks;Patient/family education;Cueing hierarchy;Multimodal communcation approach;Internal/external aids;SLP instruction and feedback   Potential to Achieve Goals Good   Potential Considerations Ability to learn/carryover information   Consulted and Agree with Plan of Care Patient;Family member/caregiver   Family Member Consulted daughter      Patient will benefit from skilled therapeutic intervention in order to improve the following deficits and impairments:   Dysarthria and anarthria      G-Codes - 01/22/16 0731    Functional Assessment Tool Used NOMS   Motor Speech Current Status (G8999) At least 1 percent but less than 20 percent impaired, limited or restricted   Motor Speech Goal Status (G9186) At least 1 percent but less than 20 percent impaired, limited or restricted      Problem List Patient Active Problem List   Diagnosis Date Noted  . Somnolence, daytime 12/08/2015  . Cerebral thrombosis with cerebral infarction 09/07/2015  . Lacunar infarct, acute (HCC)   . Nausea & vomiting 09/05/2015  . Acute kidney injury (HCC) 09/04/2015  . Type II diabetes mellitus with neurological manifestations, uncontrolled (HCC) 09/03/2015  . Bilateral lower extremity edema 09/03/2015  . Essential hypertension   . Diabetes mellitus type 2 in nonobese (HCC)   . History of breast cancer   . AKI (acute kidney injury) (HCC)   . Acute blood loss anemia   . Hemiparesis (HCC)   . Gait disturbance, post-stroke   . Dysphagia, post-stroke   . Acute ischemic stroke/right cerebellar 08/24/2015  . Breast cancer of upper-outer quadrant of right female breast (HCC) 04/30/2015  . CVA (cerebral infarction) 07/05/2014  . Hyperlipidemia 07/05/2014  . Syncope 07/04/2014  . Mild mitral regurgitation by prior echocardiogram 12/20/2011  . Diabetes mellitus type 1, uncontrolled, insulin dependent (HCC) 02/26/2009  . OBESITY 02/26/2009  . Uncontrolled hypertension 02/26/2009  . CAD  (coronary artery disease) 02/26/2009  . GASTROESOPHAGEAL REFLUX DISEASE 02/26/2009    ,  Ann  MS, CCC-SLP 01/22/2016, 7:32 AM  [facility] [address] [place], [place], [address] Phone: [phone]   Fax:  [phone]   Name: Glenda [name] MRN: [id number] Date of Birth: 05/03/1933  

## 2016-01-27 ENCOUNTER — Encounter: Payer: Medicare Other | Admitting: *Deleted

## 2016-01-27 ENCOUNTER — Encounter: Payer: Self-pay | Admitting: Physical Therapy

## 2016-01-27 ENCOUNTER — Ambulatory Visit: Payer: 59 | Attending: Family Medicine | Admitting: Physical Therapy

## 2016-01-27 ENCOUNTER — Ambulatory Visit: Payer: 59 | Admitting: Occupational Therapy

## 2016-01-27 DIAGNOSIS — R2681 Unsteadiness on feet: Secondary | ICD-10-CM

## 2016-01-27 DIAGNOSIS — M6281 Muscle weakness (generalized): Secondary | ICD-10-CM

## 2016-01-27 DIAGNOSIS — G8191 Hemiplegia, unspecified affecting right dominant side: Secondary | ICD-10-CM | POA: Diagnosis present

## 2016-01-27 DIAGNOSIS — R41842 Visuospatial deficit: Secondary | ICD-10-CM | POA: Diagnosis present

## 2016-01-27 DIAGNOSIS — R278 Other lack of coordination: Secondary | ICD-10-CM

## 2016-01-27 DIAGNOSIS — R2689 Other abnormalities of gait and mobility: Secondary | ICD-10-CM

## 2016-01-27 NOTE — Therapy (Signed)
[facility] [address] [place], [place], [address] Phone: [phone]   Fax:  [phone]  Physical Therapy Treatment  Patient Details  Name: Glenda [name] MRN: [id number] Date of Birth: [birth year] Referring Provider: Donnie Coffin, MD  Encounter Date: 01/27/2016      PT End of Session - 01/27/16 1452    Visit Number 23   Number of Visits 25   Date for PT Re-Evaluation 01/29/16   Authorization Type [employer] MCR- G code needed every 10th visit   PT Start Time 1449   PT Stop Time 1535   PT Time Calculation (min) 46 min   Equipment Utilized During Treatment Gait belt   Activity Tolerance Patient tolerated treatment well;Patient limited by fatigue   Behavior During Therapy [facility] for tasks assessed/performed      Past Medical History:  Diagnosis Date  . Arthritis   . Breast cancer ([place])   . CAD (coronary artery disease)   . Chest pain   . Dizziness   . DM (diabetes mellitus) ([place])   . GERD (gastroesophageal reflux disease)   . HLD (hyperlipidemia)   . HTN (hypertension)   . Obesity   . Radiation 09/11/15-10/13/15   42.72 Gy to right breast, 12 Gy boost to right breast  . Shortness of breath dyspnea   . Stroke ([place])   . UTI (urinary tract infection)     Past Surgical History:  Procedure Laterality Date  . CHOLECYSTECTOMY    . colon polyps; hx    . CORONARY ANGIOPLASTY  02/14/09  . ESOPHAGOGASTRODUODENOSCOPY (EGD) WITH PROPOFOL Left 09/08/2015   Procedure: ESOPHAGOGASTRODUODENOSCOPY (EGD) WITH PROPOFOL;  Surgeon: Clarene Essex, MD;  Location: [facility] ENDOSCOPY;  Service: Endoscopy;  Laterality: Left;  . RADIOACTIVE SEED GUIDED MASTECTOMY WITH AXILLARY SENTINEL LYMPH NODE BIOPSY Right 07/17/2015   Procedure: RADIOACTIVE SEED GUIDED PARTIAL MASTECTOMY WITH AXILLARY SENTINEL LYMPH NODE BIOPSY;  Surgeon: Erroll Luna, MD;  Location: [facility];  Service: General;  Laterality: Right;    There were no vitals filed for this  visit.      Subjective Assessment - 01/27/16 1451    Subjective No new complaints. No falls or pain to report.    Patient is accompained by: Family member   Limitations Walking;House hold activities   Patient Stated Goals "I want to work on walking"    Currently in Pain? No/denies   Pain Score 0-No pain           [facility] Adult PT Treatment/Exercise - 01/27/16 1454      Transfers   Transfers Sit to Stand;Stand to Sit   Sit to Stand 5: Supervision;With upper extremity assist;From bed;From chair/3-in-1   Sit to Stand Details Verbal cues for sequencing;Verbal cues for precautions/safety;Verbal cues for safe use of DME/AE   Sit to Stand Details (indicate cue type and reason) cues to scoot to edge and for anterior weight shifting. Pt demo'd good hand placement.   Stand to Sit 5: Supervision;4: Min guard;With upper extremity assist;To bed;To chair/3-in-1;Uncontrolled descent   Stand to Sit Details (indicate cue type and reason) Verbal cues for precautions/safety;Verbal cues for safe use of DME/AE   Stand to Sit Details cues to always turn and fully align with surface prior to sitting down. Pt demo'd good hand use each time with uncontrolled descent as she fatigued.     Ambulation/Gait   Ambulation/Gait Yes   Ambulation/Gait Assistance 4: Min guard   Ambulation/Gait Assistance Details occasional cues needed for right foot clearance with swing  phase, minimal reminder cues for walker position with gait and for increased bil step length.   Ambulation Distance (Feet) 225 Feet   Assistive device Rolling walker   Gait Pattern Step-through pattern;Decreased stride length;Decreased step length - right;Decreased step length - left;Decreased stance time - right;Decreased hip/knee flexion - right;Decreased weight shift to right;Shuffle;Ataxic;Trunk flexed;Narrow base of support;Poor foot clearance - right   Ambulation Surface Level;Unlevel;Indoor;Outdoor;Paved     Berg Balance Test   Sit to Stand  Able to stand  independently using hands   Standing Unsupported Able to stand 2 minutes with supervision   Sitting with Back Unsupported but Feet Supported on Floor or Stool Able to sit safely and securely 2 minutes   Stand to Sit Controls descent by using hands   Transfers Able to transfer with verbal cueing and /or supervision   Standing Unsupported with Eyes Closed Able to stand 10 seconds with supervision   Standing Ubsupported with Feet Together Needs help to attain position but able to stand for 30 seconds with feet together   From Standing, Reach Forward with Outstretched Arm Can reach forward >12 cm safely (5")  5 inches   From Standing Position, Pick up Object from Floor Able to pick up shoe, needs supervision   From Standing Position, Turn to Look Behind Over each Shoulder Turn sideways only but maintains balance   Turn 360 Degrees Needs assistance while turning   Standing Unsupported, Alternately Place Feet on Step/Stool Needs assistance to keep from falling or unable to try   Standing Unsupported, One Foot in Front Needs help to step but can hold 15 seconds   Standing on One Leg Unable to try or needs assist to prevent fall   Total Score 28     Timed Up and Go Test   TUG Normal TUG   Normal TUG (seconds) 37.2  with RW            PT Short Term Goals - 12/03/15 1107      PT SHORT TERM GOAL #1   Title Pt will initiate HEP for strengthening and balance in order to improve functional mobility.  (Target Date: 11/28/15)   Baseline 12/03/15: met today after reinstruction with pictures to daughter/pt   Status Achieved     PT SHORT TERM GOAL #2   Title Pt will improve TUG score to <80 seconds in order to indicate decreased fall risk.     Baseline 11/25/15: 52.44 sec's with RW and simulated toe cap    Status Achieved     PT SHORT TERM GOAL #3   Title Will assess BERG and improve score by 4 points in order to indicate decreased fall risk and functional improvement in balance.      Baseline 11/27/15: 17/56 today (was 12/56 at baseline)   Status Achieved     PT SHORT TERM GOAL #4   Title Pt will ambulate over indoor surfaces x 150' w/ RW at S level in order to indicate safe negotiation in home.     Baseline 11/25/15: pt able to ambulate this distance with varied amout of assistance (min guard to min assist)   Status Partially Met     PT SHORT TERM GOAL #5   Title Pt will verbalize fall prevention strategies in order to indicate decreased fall risk inside and outside of home.     Baseline 12/03/15: fall prevention strategies issued today with review with pt and daughter   Status Partially Met     PT SHORT  TERM GOAL #6   Title Pt will perform 8/10 sit <>stands with single UE support only at mod I level in order to indicate improved functional strength.     Baseline 12/03/15: pt continues to need min>mod assist to power up and to control descent with sitting down using 1-2 UE's to assist.   Status Not Met           PT Long Term Goals - 01/27/16 1453      PT LONG TERM GOAL #1   Title Pt will be independent with HEP in order to indicate improved functional mobility and decreased fall risk.  (Modified Target Date: 01/29/16)   Baseline met per pt and daughter's report - reports doing them "occasionally"   Status Achieved     PT LONG TERM GOAL #2   Title Pt will improve TUG score to <40 secs in order to indicate decreased fall risk.     Baseline 01/27/16: 37.2 sec's with RW today   Status Achieved     PT LONG TERM GOAL #3   Title Pt will improve BERG balance score 8 points from baseline in order to indicate decreased fall risk.     Baseline 01/27/16: 28/56 today (was 10/56 on 12/25/15)   Status Achieved     PT LONG TERM GOAL #4   Title Pt will perform 8/10 sit<>stand without UE support at mod I level in order to indicate improved functional strength.     Baseline 01/27/16: pt continues to require UE support to stand standard height surfaces and lower   Status Not Met      PT LONG TERM GOAL #5   Title Pt will ambulate 300' w/ LRAD over paved outdoor surfaces at mod I level in order to indicate safe return to community.     Baseline 01/27/16: able to ambulate up to 225 ft consecutively over indoor/outdoor paved surfaces at supervision to min guard assist level   Status On-going     PT LONG TERM GOAL #6   Title Pt will negotiate up/down 5 steps w/ single rail at mod I level in order to indicate safe entry into home/family's homes.     Status On-going            Plan - 01/27/16 1452    Clinical Impression Statement Today's session focused on checking progress toward current LTGs. Pt has met her current timed up and go and berg balance test goals. Progress has been made toward others checked today except for standing without UE support as pt continues to need her UE's to stand. Will plan to check remaining goals with PT planning to renew pt.   PT Frequency 2x / week   PT Duration 4 weeks   PT Treatment/Interventions ADLs/Self Care Home Management;Electrical Stimulation;DME Instruction;Gait training;Stair training;Functional mobility training;Therapeutic activities;Therapeutic exercise;Balance training;Neuromuscular re-education;Patient/family education;Orthotic Fit/Training;Vestibular;Visual/perceptual remediation/compensation   PT Next Visit Plan check remaining LTGs with primary PT to complete the recertification   Consulted and Agree with Plan of Care Patient;Family member/caregiver   Family Member Consulted daughter,       Patient will benefit from skilled therapeutic intervention in order to improve the following deficits and impairments:  Abnormal gait, Decreased activity tolerance, Decreased balance, Decreased coordination, Decreased endurance, Decreased knowledge of use of DME, Decreased mobility, Decreased strength, Dizziness, Increased edema, Impaired perceived functional ability, Impaired flexibility, Impaired UE functional use, Impaired  vision/preception, Improper body mechanics, Postural dysfunction  Visit Diagnosis: Muscle weakness (generalized)  Other abnormalities of gait and mobility  Unsteadiness on feet  Other lack of coordination     Problem List Patient Active Problem List   Diagnosis Date Noted  . Somnolence, daytime 12/08/2015  . Cerebral thrombosis with cerebral infarction 09/07/2015  . Lacunar infarct, acute ([place])   . Nausea & vomiting 09/05/2015  . Acute kidney injury ([place]) 09/04/2015  . Type II diabetes mellitus with neurological manifestations, uncontrolled ([place]) 09/03/2015  . Bilateral lower extremity edema 09/03/2015  . Essential hypertension   . Diabetes mellitus type 2 in nonobese (HCC)   . History of breast cancer   . AKI (acute kidney injury) ([place])   . Acute blood loss anemia   . Hemiparesis ([place])   . Gait disturbance, post-stroke   . Dysphagia, post-stroke   . Acute ischemic stroke/right cerebellar 08/24/2015  . Breast cancer of upper-outer quadrant of right female breast ([place]) 04/30/2015  . CVA (cerebral infarction) 07/05/2014  . Hyperlipidemia 07/05/2014  . Syncope 07/04/2014  . Mild mitral regurgitation by prior echocardiogram 12/20/2011  . Diabetes mellitus type 1, uncontrolled, insulin dependent ([place]) 02/26/2009  . OBESITY 02/26/2009  . Uncontrolled hypertension 02/26/2009  . CAD (coronary artery disease) 02/26/2009  . GASTROESOPHAGEAL REFLUX DISEASE 02/26/2009    Willow Ora, PTA, [facility] [address] [place], [place] [address] [phone] 01/28/16, 12:03 PM   Name: Ersie Savino MRN: [id number] Date of Birth: 1933-06-18

## 2016-01-27 NOTE — Therapy (Signed)
[facility] [address] [place], [place], [address] Phone: [phone]   Fax:  [phone]  Occupational Therapy Treatment  Patient Details  Name: Glenda [name] MRN: [id number] Date of Birth: 1933/11/22 Referring Provider: Dr. Donnie Coffin  Encounter Date: 01/27/2016      OT End of Session - 01/27/16 1344    Visit Number 21   Number of Visits 32   Date for OT Re-Evaluation 03/07/16   Authorization Type [employer] MCR- short term goals due 02/06/16   Authorization - Visit Number 21   Authorization - Number of Visits 30   OT Start Time 8832   OT Stop Time 1400   OT Time Calculation (min) 43 min   Activity Tolerance Patient tolerated treatment well   Behavior During Therapy [facility] for tasks assessed/performed      Past Medical History:  Diagnosis Date  . Arthritis   . Breast cancer ([place])   . CAD (coronary artery disease)   . Chest pain   . Dizziness   . DM (diabetes mellitus) ([place])   . GERD (gastroesophageal reflux disease)   . HLD (hyperlipidemia)   . HTN (hypertension)   . Obesity   . Radiation 09/11/15-10/13/15   42.72 Gy to right breast, 12 Gy boost to right breast  . Shortness of breath dyspnea   . Stroke ([place])   . UTI (urinary tract infection)     Past Surgical History:  Procedure Laterality Date  . CHOLECYSTECTOMY    . colon polyps; hx    . CORONARY ANGIOPLASTY  02/14/09  . ESOPHAGOGASTRODUODENOSCOPY (EGD) WITH PROPOFOL Left 09/08/2015   Procedure: ESOPHAGOGASTRODUODENOSCOPY (EGD) WITH PROPOFOL;  Surgeon: Clarene Essex, MD;  Location: [facility] ENDOSCOPY;  Service: Endoscopy;  Laterality: Left;  . RADIOACTIVE SEED GUIDED MASTECTOMY WITH AXILLARY SENTINEL LYMPH NODE BIOPSY Right 07/17/2015   Procedure: RADIOACTIVE SEED GUIDED PARTIAL MASTECTOMY WITH AXILLARY SENTINEL LYMPH NODE BIOPSY;  Surgeon: Erroll Luna, MD;  Location: [facility];  Service: General;  Laterality: Right;    There were no vitals filed for this  visit.      Subjective Assessment - 01/27/16 1325    Subjective  Pt's birthday party is this weekend   Pertinent History CVA 08/24/15   Patient Stated Goals To do everything I did before   Currently in Pain? No/denies       Standing to remove items from dryer then fold in standing with min a for balance. Pt fatigues quickly requiring a rest break after folding 3 items. Min A, min v.c. For amb with walker as pt does not fully attend to right foot/ environment Handwriting activities with improved legibility, grossly 95-100% legible. Copying small peg design with RUE for  increased fine motor coordination with cognitive component, min  v.c. For correct design, min difficulty for coordination                         OT Short Term Goals - 01/22/16 1630      OT SHORT TERM GOAL #1   Title I with inital HEP.   Time 4   Period Weeks   Status Achieved     OT SHORT TERM GOAL #2   Title Pt will demonstrate ability to retrieve a lightweight object at 110* with RUE.   Time 4   Period Weeks   Status On-going  100*     OT SHORT TERM GOAL #3   Title Pt will demonstrate improved fine motor coordination  as evidenced by decreasing 9 hole peg test score by 4 secs bilaterally.   Status Partially Met  met for LUE only 36.40, RUE 75.72, 69.09     OT SHORT TERM GOAL #4   Title Pt will perform simple snack prep or light home management task in standing with min A without LOB demonstrating good safety awareness.   Time 4   Period Weeks   Status Achieved     OT SHORT TERM GOAL #5   Title Pt will demonstrate ability to write a short paragraph with 95% legibility.   Time 4   Period Weeks   Status On-going  not consistent, 80-95% legibility     OT SHORT TERM GOAL #6   Title Further assess cognition and set goal PRN   Time 4   Period Weeks   Status Deferred     OT SHORT TERM GOAL #7   Title Pt will demonstrate ability to navigate a busy environment and locate items with  80% accuracy.   Time 4   Period Weeks   Status On-going     OT SHORT TERM GOAL #8   Title Pt demonstrate improved fine motor coordination as evidenced by perfoming 9 hole peg test in 63 secs or less with RUE    Baseline 75.72, 69.09   Time 4   Period Weeks   Status Achieved  54.37 secs           OT Long Term Goals - 01/08/16 1551      OT LONG TERM GOAL #1   Title I with updated HEP.   Time 8   Period Weeks   Status On-going  previous HEP still appropriate      OT LONG TERM GOAL #2   Title Pt will perform simple cooking task in standing with supervision demonstrating good safety awareness.- Revised goal-Pt will perform simple cooking task in standing with min A.   Time 8   Period Weeks   Status Revised  not fully addressed due to pt's decline since TIA     OT LONG TERM GOAL #3   Title Pt will demonstrate ability to retrieve a light weight object at 120 shoulder flexion with RUE   Time 8   Period Weeks   Status On-going  95-100     OT LONG TERM GOAL #4   Title Pt will resume use of RUE as dominant hand at least 75% of the time for ADLs/IADLs.   Time 8   Period Weeks   Status On-going  50%     OT LONG TERM GOAL #5   Title Pt will decrease RUE 9 hole peg test score to 49 secs from for increased fine motor coordination. - Revised pt will complete 9 hole peg test in 54 secs or less with RUE.   Status Revised  75.72, 69.09     OT LONG TERM GOAL #6   Title Pt will be modified indpendent with all basic ADLs.   Time 8   Period Weeks   Status Achieved  except for supervision with shower transfers               Plan - 01/27/16 1327    Clinical Impression Statement Pt  is progressing towards goals. she remains limited by decreased right side awareness, weaknes and cognitve deficits.   Rehab Potential Good   OT Frequency 2x / week   OT Duration 8 weeks   OT Treatment/Interventions Self-care/ADL training;Therapeutic exercise;Patient/family  education;Functional Mobility Training;Neuromuscular  education;Manual Therapy;Therapeutic exercises;DME and/or AE instruction;Therapeutic activities;Electrical Stimulation;Fluidtherapy;Cognitive remediation/compensation;Moist Heat;Passive range of motion;Visual/perceptual remediation/compensation   Plan environmental scanning   Consulted and Agree with Plan of Care Patient   Family Member Consulted daughter      Patient will benefit from skilled therapeutic intervention in order to improve the following deficits and impairments:  Abnormal gait, Decreased coordination, Decreased range of motion, Difficulty walking, Decreased safety awareness, Decreased endurance, Decreased activity tolerance, Decreased knowledge of precautions, Impaired UE functional use, Decreased knowledge of use of DME, Decreased balance, Decreased cognition, Decreased mobility, Decreased strength, Impaired perceived functional ability  Visit Diagnosis: Other lack of coordination  Muscle weakness (generalized)  Visuospatial deficit  Hemiparesis of right dominant side, unspecified hemiparesis etiology ([place])    Problem List Patient Active Problem List   Diagnosis Date Noted  . Somnolence, daytime 12/08/2015  . Cerebral thrombosis with cerebral infarction 09/07/2015  . Lacunar infarct, acute ([place])   . Nausea & vomiting 09/05/2015  . Acute kidney injury ([place]) 09/04/2015  . Type II diabetes mellitus with neurological manifestations, uncontrolled ([place]) 09/03/2015  . Bilateral lower extremity edema 09/03/2015  . Essential hypertension   . Diabetes mellitus type 2 in nonobese ([facility])   . History of breast cancer   . AKI (acute kidney injury) ([place])   . Acute blood loss anemia   . Hemiparesis ([place])   . Gait disturbance, post-stroke   . Dysphagia, post-stroke   . Acute ischemic stroke/right cerebellar 08/24/2015  . Breast cancer of upper-outer quadrant of right female breast ([place]) 04/30/2015  . CVA (cerebral infarction)  07/05/2014  . Hyperlipidemia 07/05/2014  . Syncope 07/04/2014  . Mild mitral regurgitation by prior echocardiogram 12/20/2011  . Diabetes mellitus type 1, uncontrolled, insulin dependent ([place]) 02/26/2009  . OBESITY 02/26/2009  . Uncontrolled hypertension 02/26/2009  . CAD (coronary artery disease) 02/26/2009  . GASTROESOPHAGEAL REFLUX DISEASE 02/26/2009    Glenda [name] 01/27/2016, 1:44 PM  [facility] [address] [place], [place], [address] Phone: [phone]   Fax:  [phone]  Name: Glenda [name] MRN: [id number] Date of Birth: 12-28-1933

## 2016-01-29 ENCOUNTER — Ambulatory Visit: Payer: 59 | Admitting: Physical Therapy

## 2016-01-29 ENCOUNTER — Ambulatory Visit: Payer: 59 | Admitting: Occupational Therapy

## 2016-01-29 ENCOUNTER — Encounter: Payer: Self-pay | Admitting: Physical Therapy

## 2016-01-29 DIAGNOSIS — R2681 Unsteadiness on feet: Secondary | ICD-10-CM

## 2016-01-29 DIAGNOSIS — M6281 Muscle weakness (generalized): Secondary | ICD-10-CM | POA: Diagnosis not present

## 2016-01-29 DIAGNOSIS — G8191 Hemiplegia, unspecified affecting right dominant side: Secondary | ICD-10-CM

## 2016-01-29 DIAGNOSIS — R2689 Other abnormalities of gait and mobility: Secondary | ICD-10-CM

## 2016-01-29 DIAGNOSIS — R41842 Visuospatial deficit: Secondary | ICD-10-CM

## 2016-01-29 NOTE — Therapy (Signed)
[facility] [address] [place], [place], [address] Phone: [phone]   Fax:  [phone]  Occupational Therapy Treatment  Patient Details  Name: Glenda [name] MRN: [id number] Date of Birth: [birth year] Referring Provider: Dr. Donnie Coffin  Encounter Date: 01/29/2016      OT End of Session - 01/29/16 1638    Visit Number 22   Number of Visits 32   Date for OT Re-Evaluation 03/07/16   Authorization Type UHC MCR- short term goals due 02/06/16   Authorization - Visit Number 2   Authorization - Number of Visits 30   OT Start Time 9628   OT Stop Time 1615   OT Time Calculation (min) 45 min   Activity Tolerance Patient tolerated treatment well      Past Medical History:  Diagnosis Date  . Arthritis   . Breast cancer ([place])   . CAD (coronary artery disease)   . Chest pain   . Dizziness   . DM (diabetes mellitus) ([place])   . GERD (gastroesophageal reflux disease)   . HLD (hyperlipidemia)   . HTN (hypertension)   . Obesity   . Radiation 09/11/15-10/13/15   42.72 Gy to right breast, 12 Gy boost to right breast  . Shortness of breath dyspnea   . Stroke ([place])   . UTI (urinary tract infection)     Past Surgical History:  Procedure Laterality Date  . CHOLECYSTECTOMY    . colon polyps; hx    . CORONARY ANGIOPLASTY  02/14/09  . ESOPHAGOGASTRODUODENOSCOPY (EGD) WITH PROPOFOL Left 09/08/2015   Procedure: ESOPHAGOGASTRODUODENOSCOPY (EGD) WITH PROPOFOL;  Surgeon: Clarene Essex, MD;  Location: [facility] ENDOSCOPY;  Service: Endoscopy;  Laterality: Left;  . RADIOACTIVE SEED GUIDED MASTECTOMY WITH AXILLARY SENTINEL LYMPH NODE BIOPSY Right 07/17/2015   Procedure: RADIOACTIVE SEED GUIDED PARTIAL MASTECTOMY WITH AXILLARY SENTINEL LYMPH NODE BIOPSY;  Surgeon: Erroll Luna, MD;  Location: [facility];  Service: General;  Laterality: Right;    There were no vitals filed for this visit.      Subjective Assessment - 01/29/16 1631     Subjective  I'm tired   Pertinent History CVA 08/24/15   Patient Stated Goals To do everything I did before   Currently in Pain? No/denies                      OT Treatments/Exercises (OP) - 01/29/16 1631      ADLs   Writing Practiced writing short paragraph with approx. 50-75% accuracy    ADL Comments Discussion with pt/daughter re: safety with ambulation and recommended removing all obstacles in home (throw rugs, clutter, electrical cords, etc). Also recommended pt have close sup to CGA with any community ambulation d/t pt's decr. awareness to her surroundings, decr. attention, decr. balance and endurance. Education re: inability to control community environment like you can in home. Also, began discussion about d/c plans as pt appears to be reaching maximal rehab potential due to poor awareness and fluctuating status     Visual/Perceptual Exercises   Scanning - Environmental Pt performing obstacle negotiation which pt anticipated and was negotiating through tight spaces and moving things while ambulating with walker. Pt near end of task, dragged tray table along back wheel of her walker and required v.c's to attend to it and correct it. Pt performed environmental scanning looking for cards with only approx. 35-40% accuracy. Pt missed several cards on both sides in lower and/or higher quadrants and eye level with busy backgrounds. Pt  only id cards eye level with contrasted background                  OT Short Term Goals - 01/22/16 1630      OT SHORT TERM GOAL #1   Title I with inital HEP.   Time 4   Period Weeks   Status Achieved     OT SHORT TERM GOAL #2   Title Pt will demonstrate ability to retrieve a lightweight object at 110* with RUE.   Time 4   Period Weeks   Status On-going  100*     OT SHORT TERM GOAL #3   Title Pt will demonstrate improved fine motor coordination as evidenced by decreasing 9 hole peg test score by 4 secs bilaterally.   Status  Partially Met  met for LUE only 36.40, RUE 75.72, 69.09     OT SHORT TERM GOAL #4   Title Pt will perform simple snack prep or light home management task in standing with min A without LOB demonstrating good safety awareness.   Time 4   Period Weeks   Status Achieved     OT SHORT TERM GOAL #5   Title Pt will demonstrate ability to write a short paragraph with 95% legibility.   Time 4   Period Weeks   Status On-going  not consistent, 80-95% legibility     OT SHORT TERM GOAL #6   Title Further assess cognition and set goal PRN   Time 4   Period Weeks   Status Deferred     OT SHORT TERM GOAL #7   Title Pt will demonstrate ability to navigate a busy environment and locate items with 80% accuracy.   Time 4   Period Weeks   Status On-going     OT SHORT TERM GOAL #8   Title Pt demonstrate improved fine motor coordination as evidenced by perfoming 9 hole peg test in 63 secs or less with RUE    Baseline 75.72, 69.09   Time 4   Period Weeks   Status Achieved  54.37 secs           OT Long Term Goals - 01/08/16 1551      OT LONG TERM GOAL #1   Title I with updated HEP.   Time 8   Period Weeks   Status On-going  previous HEP still appropriate      OT LONG TERM GOAL #2   Title Pt will perform simple cooking task in standing with supervision demonstrating good safety awareness.- Revised goal-Pt will perform simple cooking task in standing with min A.   Time 8   Period Weeks   Status Revised  not fully addressed due to pt's decline since TIA     OT LONG TERM GOAL #3   Title Pt will demonstrate ability to retrieve a light weight object at 120 shoulder flexion with RUE   Time 8   Period Weeks   Status On-going  95-100     OT LONG TERM GOAL #4   Title Pt will resume use of RUE as dominant hand at least 75% of the time for ADLs/IADLs.   Time 8   Period Weeks   Status On-going  50%     OT LONG TERM GOAL #5   Title Pt will decrease RUE 9 hole peg test score to 49  secs from for increased fine motor coordination. - Revised pt will complete 9 hole peg test in 54 secs or less  with RUE.   Status Revised  75.72, 69.09     OT LONG TERM GOAL #6   Title Pt will be modified indpendent with all basic ADLs.   Time 8   Period Weeks   Status Achieved  except for supervision with shower transfers               Plan - 01/29/16 1639    Clinical Impression Statement Pt remains limited by decr. awareness, cognitive deficits, decreased strength/endurance and balance deficits. Pt/family education needs to continue to maximize carryover/function and for pt safety   Rehab Potential Good   OT Frequency 2x / week   OT Duration 8 weeks   OT Treatment/Interventions Self-care/ADL training;Therapeutic exercise;Patient/family education;Functional Mobility Training;Neuromuscular education;Manual Therapy;Therapeutic exercises;DME and/or AE instruction;Therapeutic activities;Electrical Stimulation;Fluidtherapy;Cognitive remediation/compensation;Moist Heat;Passive range of motion;Visual/perceptual remediation/compensation   Plan continue attention/awareness, pt/family education, progress as able towards goals    Consulted and Agree with Plan of Care Patient;Family member/caregiver   Family Member Consulted daughter      Patient will benefit from skilled therapeutic intervention in order to improve the following deficits and impairments:  Abnormal gait, Decreased coordination, Decreased range of motion, Difficulty walking, Decreased safety awareness, Decreased endurance, Decreased activity tolerance, Decreased knowledge of precautions, Impaired UE functional use, Decreased knowledge of use of DME, Decreased balance, Decreased cognition, Decreased mobility, Decreased strength, Impaired perceived functional ability  Visit Diagnosis: Visuospatial deficit  Hemiparesis of right dominant side, unspecified hemiparesis etiology ([place])  Unsteadiness on feet    Problem  List Patient Active Problem List   Diagnosis Date Noted  . Somnolence, daytime 12/08/2015  . Cerebral thrombosis with cerebral infarction 09/07/2015  . Lacunar infarct, acute ([place])   . Nausea & vomiting 09/05/2015  . Acute kidney injury ([place]) 09/04/2015  . Type II diabetes mellitus with neurological manifestations, uncontrolled ([place]) 09/03/2015  . Bilateral lower extremity edema 09/03/2015  . Essential hypertension   . Diabetes mellitus type 2 in nonobese ([facility])   . History of breast cancer   . AKI (acute kidney injury) ([place])   . Acute blood loss anemia   . Hemiparesis ([place])   . Gait disturbance, post-stroke   . Dysphagia, post-stroke   . Acute ischemic stroke/right cerebellar 08/24/2015  . Breast cancer of upper-outer quadrant of right female breast ([place]) 04/30/2015  . CVA (cerebral infarction) 07/05/2014  . Hyperlipidemia 07/05/2014  . Syncope 07/04/2014  . Mild mitral regurgitation by prior echocardiogram 12/20/2011  . Diabetes mellitus type 1, uncontrolled, insulin dependent ([place]) 02/26/2009  . OBESITY 02/26/2009  . Uncontrolled hypertension 02/26/2009  . CAD (coronary artery disease) 02/26/2009  . GASTROESOPHAGEAL REFLUX DISEASE 02/26/2009    Carey Bullocks, OTR/L 01/29/2016, 4:43 PM  [facility] [address] [place], [place], [address] Phone: [phone]   Fax:  [phone]  Name: Shandie Bertz MRN: [id number] Date of Birth: Aug 01, 1933

## 2016-01-30 ENCOUNTER — Ambulatory Visit: Payer: Medicare Other | Admitting: Rehabilitation

## 2016-01-30 NOTE — Therapy (Cosign Needed)
[facility] [address] [place], [place], [address] Phone: [phone]   Fax:  [phone]  Physical Therapy Treatment  Patient Details  Name: Glenda [name] MRN: [id number] Date of Birth: 10/04/1933 Referring Provider: Donnie Coffin, MD  Encounter Date: 01/29/2016      PT End of Session - 01/29/16 1452    Visit Number 24   Number of Visits 25   Date for PT Re-Evaluation 01/29/16   Authorization Type UHC MCR- G code needed every 10th visit   PT Start Time 1447   PT Stop Time 1530   PT Time Calculation (min) 43 min   Equipment Utilized During Treatment Gait belt   Activity Tolerance Patient tolerated treatment well;Patient limited by fatigue   Behavior During Therapy [facility] for tasks assessed/performed      Past Medical History:  Diagnosis Date  . Arthritis   . Breast cancer ([place])   . CAD (coronary artery disease)   . Chest pain   . Dizziness   . DM (diabetes mellitus) ([place])   . GERD (gastroesophageal reflux disease)   . HLD (hyperlipidemia)   . HTN (hypertension)   . Obesity   . Radiation 09/11/15-10/13/15   42.72 Gy to right breast, 12 Gy boost to right breast  . Shortness of breath dyspnea   . Stroke ([place])   . UTI (urinary tract infection)     Past Surgical History:  Procedure Laterality Date  . CHOLECYSTECTOMY    . colon polyps; hx    . CORONARY ANGIOPLASTY  02/14/09  . ESOPHAGOGASTRODUODENOSCOPY (EGD) WITH PROPOFOL Left 09/08/2015   Procedure: ESOPHAGOGASTRODUODENOSCOPY (EGD) WITH PROPOFOL;  Surgeon: Clarene Essex, MD;  Location: [facility] ENDOSCOPY;  Service: Endoscopy;  Laterality: Left;  . RADIOACTIVE SEED GUIDED MASTECTOMY WITH AXILLARY SENTINEL LYMPH NODE BIOPSY Right 07/17/2015   Procedure: RADIOACTIVE SEED GUIDED PARTIAL MASTECTOMY WITH AXILLARY SENTINEL LYMPH NODE BIOPSY;  Surgeon: Erroll Luna, MD;  Location: [place];  Service: General;  Laterality: Right;    There were no vitals filed for this  visit.      Subjective Assessment - 01/29/16 1451    Subjective No new complaints. No falls or pain to report.    Patient is accompained by: Family member   Limitations Walking;House hold activities   Patient Stated Goals "I want to work on walking"    Currently in Pain? No/denies   Pain Score 0-No pain            [facility] Adult PT Treatment/Exercise - 01/29/16 1453      Transfers   Transfers Sit to Stand;Stand to Sit   Sit to Stand 5: Supervision;With upper extremity assist;From bed;From chair/3-in-1   Sit to Stand Details Verbal cues for sequencing;Verbal cues for precautions/safety;Verbal cues for safe use of DME/AE   Sit to Stand Details (indicate cue type and reason) minimal reminder cues for hand placement and to scoot to edge of surface prior to standing   Stand to Sit 5: Supervision;4: Min guard;With upper extremity assist;To bed;To chair/3-in-1;Uncontrolled descent   Stand to Sit Details (indicate cue type and reason) Verbal cues for precautions/safety;Verbal cues for safe use of DME/AE   Stand to Sit Details needed cues when fatigued to turn fully to surface prior to sitting down, occasional cues to reach back     Ambulation/Gait   Ambulation/Gait Yes   Ambulation/Gait Assistance 4: Min guard;4: Min assist   Ambulation/Gait Assistance Details min assist for walker mangement for last 30-35 feet,    Ambulation  Distance (Feet) 320 Feet  x1 indoors   Assistive device Rolling walker  right AFO and toe cap   Gait Pattern Step-through pattern;Decreased stride length;Decreased step length - right;Decreased step length - left;Decreased stance time - right;Decreased hip/knee flexion - right;Decreased weight shift to right;Shuffle;Ataxic;Trunk flexed;Narrow base of support;Poor foot clearance - right   Ambulation Surface Level;Indoor   Stairs Yes   Stairs Assistance 4: Min guard;4: Min assist  min assist with descend last 2 steps on 2cd rep d/t fatigue   Stairs Assistance Details  (indicate cue type and reason) min guard assist with 4 stairs with 2 rails fwd; min guard to min assist with descending last 2 stairs using 1 rail going sideways.   Stair Management Technique One rail Right;Two rails;Step to pattern;Forwards;Sideways   Number of Stairs 4  x 2 reps   Height of Stairs 6     High Level Balance   High Level Balance Activities Marching forwards;Marching backwards;Side stepping   High Level Balance Comments in parallel bars: 3 laps each/each way with min guard to min assist for balance, bil hand support on bars. cues/facilitation for posture and ex form/technique.              PT Short Term Goals - 12/03/15 1107      PT SHORT TERM GOAL #1   Title Pt will initiate HEP for strengthening and balance in order to improve functional mobility.  (Target Date: 11/28/15)   Baseline 12/03/15: met today after reinstruction with pictures to daughter/pt   Status Achieved     PT SHORT TERM GOAL #2   Title Pt will improve TUG score to <80 seconds in order to indicate decreased fall risk.     Baseline 11/25/15: 52.44 sec's with RW and simulated toe cap    Status Achieved     PT SHORT TERM GOAL #3   Title Will assess BERG and improve score by 4 points in order to indicate decreased fall risk and functional improvement in balance.     Baseline 11/27/15: 17/56 today (was 12/56 at baseline)   Status Achieved     PT SHORT TERM GOAL #4   Title Pt will ambulate over indoor surfaces x 150' w/ RW at S level in order to indicate safe negotiation in home.     Baseline 11/25/15: pt able to ambulate this distance with varied amout of assistance (min guard to min assist)   Status Partially Met     PT SHORT TERM GOAL #5   Title Pt will verbalize fall prevention strategies in order to indicate decreased fall risk inside and outside of home.     Baseline 12/03/15: fall prevention strategies issued today with review with pt and daughter   Status Partially Met     PT SHORT TERM GOAL #6    Title Pt will perform 8/10 sit <>stands with single UE support only at mod I level in order to indicate improved functional strength.     Baseline 12/03/15: pt continues to need min>mod assist to power up and to control descent with sitting down using 1-2 UE's to assist.   Status Not Met           PT Long Term Goals - 01/29/16 1452      PT LONG TERM GOAL #1   Title Pt will be independent with HEP in order to indicate improved functional mobility and decreased fall risk.  (Modified Target Date: 01/29/16)   Baseline 01/29/16: met per pt and daughter's  report - reports doing them "occasionally". both report doing the walking program daily.   Status Achieved     PT LONG TERM GOAL #2   Title Pt will improve TUG score to <40 secs in order to indicate decreased fall risk.     Baseline 01/27/16: 37.2 sec's with RW today   Status Achieved     PT LONG TERM GOAL #3   Title Pt will improve BERG balance score 8 points from baseline in order to indicate decreased fall risk.     Baseline 01/27/16: 28/56 today (was 10/56 on 12/25/15)   Status Achieved     PT LONG TERM GOAL #4   Title Pt will perform 8/10 sit<>stand without UE support at mod I level in order to indicate improved functional strength.     Baseline 01/27/16: pt continues to require UE support to stand standard height surfaces and lower   Status Not Met     PT LONG TERM GOAL #5   Title Pt will ambulate 300' w/ LRAD over paved outdoor surfaces at mod I level in order to indicate safe return to community.     Baseline 01/27/16: able to ambulate up to 225 ft consecutively over indoor/outdoor paved surfaces at supervision to min guard assist level. 01/29/16: pt met the distance portion with supervision on indoor surfaces on this date   Status Partially Met     PT LONG TERM GOAL #6   Title Pt will negotiate up/down 5 steps w/ single rail at mod I level in order to indicate safe entry into home/family's homes.     Baseline 01/29/16: pt able to  negotiate the stairs with 1 rail at min guard to min assist level   Status Partially Met            Plan - 01/29/16 1452    Clinical Impression Statement Pt has met all LTGs except for 3: 2 of which are partially met and the 3rd one is not met as pt continues to need UE support for sit<>stands. Primary PT plans to renew pt for 4 more weeks to continues to progress mobility and balance for decreased fall risk/increased strengthening/mobility.   Rehab Potential Excellent   PT Frequency 2x / week   PT Duration 4 weeks   PT Treatment/Interventions ADLs/Self Care Home Management;Electrical Stimulation;DME Instruction;Gait training;Stair training;Functional mobility training;Therapeutic activities;Therapeutic exercise;Balance training;Neuromuscular re-education;Patient/family education;Orthotic Fit/Training;Vestibular;Visual/perceptual remediation/compensation   PT Next Visit Plan continue to work on LE strengthening, balance and activity tolerance toward goals of recertification (to be completed by primary PT)   Consulted and Agree with Plan of Care Patient;Family member/caregiver   Family Member Consulted daughter,       Patient will benefit from skilled therapeutic intervention in order to improve the following deficits and impairments:  Abnormal gait, Decreased activity tolerance, Decreased balance, Decreased coordination, Decreased endurance, Decreased knowledge of use of DME, Decreased mobility, Decreased strength, Dizziness, Increased edema, Impaired perceived functional ability, Impaired flexibility, Impaired UE functional use, Impaired vision/preception, Improper body mechanics, Postural dysfunction  Visit Diagnosis: Hemiparesis of right dominant side, unspecified hemiparesis etiology ([place])  Other abnormalities of gait and mobility  Unsteadiness on feet  Muscle weakness (generalized)     Problem List Patient Active Problem List   Diagnosis Date Noted  . Somnolence, daytime  12/08/2015  . Cerebral thrombosis with cerebral infarction 09/07/2015  . Lacunar infarct, acute ([place])   . Nausea & vomiting 09/05/2015  . Acute kidney injury ([place]) 09/04/2015  . Type II diabetes mellitus  with neurological manifestations, uncontrolled ([place]) 09/03/2015  . Bilateral lower extremity edema 09/03/2015  . Essential hypertension   . Diabetes mellitus type 2 in nonobese (HCC)   . History of breast cancer   . AKI (acute kidney injury) ([place])   . Acute blood loss anemia   . Hemiparesis ([place])   . Gait disturbance, post-stroke   . Dysphagia, post-stroke   . Acute ischemic stroke/right cerebellar 08/24/2015  . Breast cancer of upper-outer quadrant of right female breast ([place]) 04/30/2015  . CVA (cerebral infarction) 07/05/2014  . Hyperlipidemia 07/05/2014  . Syncope 07/04/2014  . Mild mitral regurgitation by prior echocardiogram 12/20/2011  . Diabetes mellitus type 1, uncontrolled, insulin dependent ([place]) 02/26/2009  . OBESITY 02/26/2009  . Uncontrolled hypertension 02/26/2009  . CAD (coronary artery disease) 02/26/2009  . GASTROESOPHAGEAL REFLUX DISEASE 02/26/2009    Willow Ora, PTA, [facility] [address] [place], [place] [address] [phone] 01/30/16, 10:08 AM   Name: Lakelynn Severtson MRN: [id number] Date of Birth: 05-21-1933

## 2016-01-30 NOTE — Addendum Note (Signed)
Addended by: Cameron Sprang A on: 01/30/2016 02:28 PM   Modules accepted: Orders

## 2016-02-03 ENCOUNTER — Ambulatory Visit (INDEPENDENT_AMBULATORY_CARE_PROVIDER_SITE_OTHER): Payer: Medicare Other | Admitting: Neurology

## 2016-02-03 DIAGNOSIS — G4733 Obstructive sleep apnea (adult) (pediatric): Secondary | ICD-10-CM

## 2016-02-03 DIAGNOSIS — G4720 Circadian rhythm sleep disorder, unspecified type: Secondary | ICD-10-CM

## 2016-02-04 ENCOUNTER — Ambulatory Visit: Payer: 59 | Admitting: Physical Therapy

## 2016-02-04 ENCOUNTER — Encounter: Payer: Self-pay | Admitting: Physical Therapy

## 2016-02-04 ENCOUNTER — Ambulatory Visit: Payer: 59 | Admitting: Occupational Therapy

## 2016-02-04 ENCOUNTER — Encounter: Payer: Medicare Other | Admitting: *Deleted

## 2016-02-04 DIAGNOSIS — R2689 Other abnormalities of gait and mobility: Secondary | ICD-10-CM

## 2016-02-04 DIAGNOSIS — M6281 Muscle weakness (generalized): Secondary | ICD-10-CM

## 2016-02-04 DIAGNOSIS — R2681 Unsteadiness on feet: Secondary | ICD-10-CM

## 2016-02-04 DIAGNOSIS — R278 Other lack of coordination: Secondary | ICD-10-CM

## 2016-02-04 DIAGNOSIS — R41842 Visuospatial deficit: Secondary | ICD-10-CM

## 2016-02-04 DIAGNOSIS — G8191 Hemiplegia, unspecified affecting right dominant side: Secondary | ICD-10-CM

## 2016-02-04 NOTE — Therapy (Signed)
[facility] [address] [place], [place], [address] Phone: [phone]   Fax:  [phone]  Occupational Therapy Treatment  Patient Details  Name: Glenda [name] MRN: [id number] Date of Birth: [birth year] Referring Provider: Dr. Donnie Coffin  Encounter Date: 02/04/2016      OT End of Session - 02/04/16 1648    Visit Number 23   Number of Visits 32   Date for OT Re-Evaluation 03/07/16   Authorization Type UHC MCR- short term goals due 02/06/16   Authorization - Visit Number 23   Authorization - Number of Visits 30   OT Start Time 1618   OT Stop Time 1700   OT Time Calculation (min) 42 min   Activity Tolerance Patient tolerated treatment well      Past Medical History:  Diagnosis Date  . Arthritis   . Breast cancer ([place])   . CAD (coronary artery disease)   . Chest pain   . Dizziness   . DM (diabetes mellitus) ([place])   . GERD (gastroesophageal reflux disease)   . HLD (hyperlipidemia)   . HTN (hypertension)   . Obesity   . Radiation 09/11/15-10/13/15   42.72 Gy to right breast, 12 Gy boost to right breast  . Shortness of breath dyspnea   . Stroke ([place])   . UTI (urinary tract infection)     Past Surgical History:  Procedure Laterality Date  . CHOLECYSTECTOMY    . colon polyps; hx    . CORONARY ANGIOPLASTY  02/14/09  . ESOPHAGOGASTRODUODENOSCOPY (EGD) WITH PROPOFOL Left 09/08/2015   Procedure: ESOPHAGOGASTRODUODENOSCOPY (EGD) WITH PROPOFOL;  Surgeon: Clarene Essex, MD;  Location: [facility] ENDOSCOPY;  Service: Endoscopy;  Laterality: Left;  . RADIOACTIVE SEED GUIDED MASTECTOMY WITH AXILLARY SENTINEL LYMPH NODE BIOPSY Right 07/17/2015   Procedure: RADIOACTIVE SEED GUIDED PARTIAL MASTECTOMY WITH AXILLARY SENTINEL LYMPH NODE BIOPSY;  Surgeon: Erroll Luna, MD;  Location: [facility];  Service: General;  Laterality: Right;    There were no vitals filed for this visit.      Subjective Assessment - 02/04/16 1642    Subjective  Pt reports she had a nice time at her birthday party   Pertinent History CVA 08/24/15   Currently in Pain? No/denies         Treatment: standing at kitchen countertop to place/ remove lightweight objects with RUE,  [place]- supervision, min v.c./ facilitation for positioning, and maintaining weight through RLE, no drops or LOB Seated copying small peg design with RUE for visual perceptual skills and fine motor coordination, min difficulty and increased time, pt copied design correctly. Removing pegs with emphasis on in hand manipulation, min v.c.                       OT Short Term Goals - 01/22/16 1630      OT SHORT TERM GOAL #1   Title I with inital HEP.   Time 4   Period Weeks   Status Achieved     OT SHORT TERM GOAL #2   Title Pt will demonstrate ability to retrieve a lightweight object at 110* with RUE.   Time 4   Period Weeks   Status On-going  100*     OT SHORT TERM GOAL #3   Title Pt will demonstrate improved fine motor coordination as evidenced by decreasing 9 hole peg test score by 4 secs bilaterally.   Status Partially Met  met for LUE only 36.40, RUE 75.72, 69.09  OT SHORT TERM GOAL #4   Title Pt will perform simple snack prep or light home management task in standing with min A without LOB demonstrating good safety awareness.   Time 4   Period Weeks   Status Achieved     OT SHORT TERM GOAL #5   Title Pt will demonstrate ability to write a short paragraph with 95% legibility.   Time 4   Period Weeks   Status On-going  not consistent, 80-95% legibility     OT SHORT TERM GOAL #6   Title Further assess cognition and set goal PRN   Time 4   Period Weeks   Status Deferred     OT SHORT TERM GOAL #7   Title Pt will demonstrate ability to navigate a busy environment and locate items with 80% accuracy.   Time 4   Period Weeks   Status On-going     OT SHORT TERM GOAL #8   Title Pt demonstrate improved fine motor  coordination as evidenced by perfoming 9 hole peg test in 63 secs or less with RUE    Baseline 75.72, 69.09   Time 4   Period Weeks   Status Achieved  54.37 secs           OT Long Term Goals - 01/08/16 1551      OT LONG TERM GOAL #1   Title I with updated HEP.   Time 8   Period Weeks   Status On-going  previous HEP still appropriate      OT LONG TERM GOAL #2   Title Pt will perform simple cooking task in standing with supervision demonstrating good safety awareness.- Revised goal-Pt will perform simple cooking task in standing with min A.   Time 8   Period Weeks   Status Revised  not fully addressed due to pt's decline since TIA     OT LONG TERM GOAL #3   Title Pt will demonstrate ability to retrieve a light weight object at 120 shoulder flexion with RUE   Time 8   Period Weeks   Status On-going  95-100     OT LONG TERM GOAL #4   Title Pt will resume use of RUE as dominant hand at least 75% of the time for ADLs/IADLs.   Time 8   Period Weeks   Status On-going  50%     OT LONG TERM GOAL #5   Title Pt will decrease RUE 9 hole peg test score to 49 secs from for increased fine motor coordination. - Revised pt will complete 9 hole peg test in 54 secs or less with RUE.   Status Revised  75.72, 69.09     OT LONG TERM GOAL #6   Title Pt will be modified indpendent with all basic ADLs.   Time 8   Period Weeks   Status Achieved  except for supervision with shower transfers               Plan - 02/04/16 1644    Clinical Impression Statement Pt is progressing towards goals for RUE functional use and coordination.   Rehab Potential Good   OT Frequency 2x / week   OT Duration 8 weeks   OT Treatment/Interventions Self-care/ADL training;Therapeutic exercise;Patient/family education;Functional Mobility Training;Neuromuscular education;Manual Therapy;Therapeutic exercises;DME and/or AE instruction;Therapeutic activities;Electrical Stimulation;Fluidtherapy;Cognitive  remediation/compensation;Moist Heat;Passive range of motion;Visual/perceptual remediation/compensation   Plan continue to address standing balance, functional use of RUE,    Consulted and Agree with Plan of Care Patient  Family Member Consulted daughter      Patient will benefit from skilled therapeutic intervention in order to improve the following deficits and impairments:  Abnormal gait, Decreased coordination, Decreased range of motion, Difficulty walking, Decreased safety awareness, Decreased endurance, Decreased activity tolerance, Decreased knowledge of precautions, Impaired UE functional use, Decreased knowledge of use of DME, Decreased balance, Decreased cognition, Decreased mobility, Decreased strength, Impaired perceived functional ability  Visit Diagnosis: Visuospatial deficit  Hemiparesis of right dominant side, unspecified hemiparesis etiology ([facility])  Unsteadiness on feet  Muscle weakness (generalized)  Other lack of coordination    Problem List Patient Active Problem List   Diagnosis Date Noted  . Somnolence, daytime 12/08/2015  . Cerebral thrombosis with cerebral infarction 09/07/2015  . Lacunar infarct, acute ([place])   . Nausea & vomiting 09/05/2015  . Acute kidney injury ([place]) 09/04/2015  . Type II diabetes mellitus with neurological manifestations, uncontrolled ([place]) 09/03/2015  . Bilateral lower extremity edema 09/03/2015  . Essential hypertension   . Diabetes mellitus type 2 in nonobese (HCC)   . History of breast cancer   . AKI (acute kidney injury) ([place])   . Acute blood loss anemia   . Hemiparesis ([place])   . Gait disturbance, post-stroke   . Dysphagia, post-stroke   . Acute ischemic stroke/right cerebellar 08/24/2015  . Breast cancer of upper-outer quadrant of right female breast ([place]) 04/30/2015  . CVA (cerebral infarction) 07/05/2014  . Hyperlipidemia 07/05/2014  . Syncope 07/04/2014  . Mild mitral regurgitation by prior echocardiogram 12/20/2011  .  Diabetes mellitus type 1, uncontrolled, insulin dependent ([place]) 02/26/2009  . OBESITY 02/26/2009  . Uncontrolled hypertension 02/26/2009  . CAD (coronary artery disease) 02/26/2009  . GASTROESOPHAGEAL REFLUX DISEASE 02/26/2009    RINE,KATHRYN 02/04/2016, 4:49 PM  [facility] [address] [place], [place], [address] Phone: [phone]   Fax:  [phone]  Name: Jazelle Achey MRN: [id number] Date of Birth: March 10, 1934

## 2016-02-04 NOTE — Patient Instructions (Signed)
Encourage Glenda [name] to use her right hand to pick up lightweight objects that are not hot, sharp, heavy or breakable. Have her practice folding laundry, (if standing, someone should be next to her.) Have her reaching into cabinets to retrieve lightweight objects (that are not breakable with her left hand.) Someone needs to be standing next to her when she is standing for safety.

## 2016-02-05 NOTE — Therapy (Signed)
[facility] [address] [place], [place], [address] Phone: [phone]   Fax:  [phone]  Physical Therapy Treatment  Patient Details  Name: Glenda [name] MRN: [id number] Date of Birth: 29-Mar-1934 Referring Provider: Donnie Coffin, MD  Encounter Date: 02/04/2016      PT End of Session - 02/04/16 1457    Visit Number 25   Number of Visits 33  updated POC   Date for PT Re-Evaluation 02/29/16   Authorization Type [employer] MCR- G code needed every 10th visit   PT Start Time 1448   PT Stop Time 1530   PT Time Calculation (min) 42 min   Equipment Utilized During Treatment Gait belt   Activity Tolerance Patient tolerated treatment well;Patient limited by fatigue   Behavior During Therapy [facility] for tasks assessed/performed      Past Medical History:  Diagnosis Date  . Arthritis   . Breast cancer ([place])   . CAD (coronary artery disease)   . Chest pain   . Dizziness   . DM (diabetes mellitus) ([place])   . GERD (gastroesophageal reflux disease)   . HLD (hyperlipidemia)   . HTN (hypertension)   . Obesity   . Radiation 09/11/15-10/13/15   42.72 Gy to right breast, 12 Gy boost to right breast  . Shortness of breath dyspnea   . Stroke ([place])   . UTI (urinary tract infection)     Past Surgical History:  Procedure Laterality Date  . CHOLECYSTECTOMY    . colon polyps; hx    . CORONARY ANGIOPLASTY  02/14/09  . ESOPHAGOGASTRODUODENOSCOPY (EGD) WITH PROPOFOL Left 09/08/2015   Procedure: ESOPHAGOGASTRODUODENOSCOPY (EGD) WITH PROPOFOL;  Surgeon: Clarene Essex, MD;  Location: [facility] ENDOSCOPY;  Service: Endoscopy;  Laterality: Left;  . RADIOACTIVE SEED GUIDED MASTECTOMY WITH AXILLARY SENTINEL LYMPH NODE BIOPSY Right 07/17/2015   Procedure: RADIOACTIVE SEED GUIDED PARTIAL MASTECTOMY WITH AXILLARY SENTINEL LYMPH NODE BIOPSY;  Surgeon: Erroll Luna, MD;  Location: [facility];  Service: General;  Laterality: Right;    There were no vitals  filed for this visit.      Subjective Assessment - 02/04/16 1454    Subjective No new complaints. No falls or pain to report. Had her sleep study last night. Preliminary study shows sleep apnea. Official report should be in within a week and the the MD will contact them to discuss the course of treatment.    Patient is accompained by: Family member   Limitations Walking;House hold activities   Patient Stated Goals "I want to work on walking"    Currently in Pain? No/denies   Pain Score 0-No pain             [facility] Adult PT Treatment/Exercise - 02/04/16 1458      Transfers   Transfers Sit to Stand;Stand to Sit   Sit to Stand 5: Supervision;With upper extremity assist;From bed;From chair/3-in-1   Stand to Sit 5: Supervision;4: Min guard;With upper extremity assist;To bed;To chair/3-in-1;Uncontrolled descent     Ambulation/Gait   Ambulation/Gait Yes   Ambulation/Gait Assistance 4: Min guard;5: Supervision   Ambulation/Gait Assistance Details occasional cues for increased step length and for walker negotiation around obstacles/barriers   Ambulation Distance (Feet) 330 Feet   Assistive device Rolling walker   Gait Pattern Step-through pattern;Decreased stride length;Decreased step length - right;Decreased step length - left;Decreased stance time - right;Decreased hip/knee flexion - right;Decreased weight shift to right;Shuffle;Ataxic;Trunk flexed;Narrow base of support;Poor foot clearance - right   Ambulation Surface Level;Indoor  Neuro Re-ed    Neuro Re-ed Details  standing on airex with RW in front and mat behind her for safety: wide base of support progressing to narrow base of support, EC no head movments to EC with head movements up<>down and left<>right x 10 reps each with min assist for balance.      Knee/Hip Exercises: Standing   Heel Raises Both;1 set;10 reps;5 seconds;Limitations   Heel Raises Limitations cues on posture and hold times with bil UE support on back of  chair with 2# ankle weights   Hip Abduction AROM;Stengthening;Both;1 set;10 reps;Knee straight;Limitations   Abduction Limitations with 2# ankle weights, alternating legs with UE support on chair back for balance. cues on posture and ex form needed   Hip Extension AROM;Stengthening;Both;1 set;10 reps;Knee straight;Limitations   Extension Limitations 2# ankle weights, alternating legs, cues on form, posture and ex technique.            PT Short Term Goals - 12/03/15 1107      PT SHORT TERM GOAL #1   Title Pt will initiate HEP for strengthening and balance in order to improve functional mobility.  (Target Date: 11/28/15)   Baseline 12/03/15: met today after reinstruction with pictures to daughter/pt   Status Achieved     PT SHORT TERM GOAL #2   Title Pt will improve TUG score to <80 seconds in order to indicate decreased fall risk.     Baseline 11/25/15: 52.44 sec's with RW and simulated toe cap    Status Achieved     PT SHORT TERM GOAL #3   Title Will assess BERG and improve score by 4 points in order to indicate decreased fall risk and functional improvement in balance.     Baseline 11/27/15: 17/56 today (was 12/56 at baseline)   Status Achieved     PT SHORT TERM GOAL #4   Title Pt will ambulate over indoor surfaces x 150' w/ RW at S level in order to indicate safe negotiation in home.     Baseline 11/25/15: pt able to ambulate this distance with varied amout of assistance (min guard to min assist)   Status Partially Met     PT SHORT TERM GOAL #5   Title Pt will verbalize fall prevention strategies in order to indicate decreased fall risk inside and outside of home.     Baseline 12/03/15: fall prevention strategies issued today with review with pt and daughter   Status Partially Met     PT SHORT TERM GOAL #6   Title Pt will perform 8/10 sit <>stands with single UE support only at mod I level in order to indicate improved functional strength.     Baseline 12/03/15: pt continues to need  min>mod assist to power up and to control descent with sitting down using 1-2 UE's to assist.   Status Not Met           PT Long Term Goals - 01/30/16 1058      PT LONG TERM GOAL #1   Title Pt will be independent with HEP in order to indicate improved functional mobility and decreased fall risk.  (Modified Target Date: 02/27/16)   Baseline 01/29/16: met per pt and daughter's report - reports doing them "occasionally". both report doing the walking program daily.   Status Achieved     PT LONG TERM GOAL #2   Title Pt will improve TUG score to <30 secs in order to indicate decreased fall risk.     Baseline 01/27/16:  37.2 sec's with RW today   Status Revised     PT LONG TERM GOAL #3   Title Pt will improve BERG balance score to 32/56 in order to indicate decreased fall risk.     Baseline 01/27/16: 28/56 today (was 10/56 on 12/25/15)   Status Revised     PT LONG TERM GOAL #4   Title Pt will perform 8/10 sit<>stand without UE support at mod I level in order to indicate improved functional strength.     Baseline 01/27/16: pt continues to require UE support to stand standard height surfaces and lower   Status On-going     PT LONG TERM GOAL #5   Title Pt will ambulate 500' w/ LRAD over paved outdoor surfaces at mod I level in order to indicate safe return to community.     Baseline 01/27/16: able to ambulate up to 225 ft consecutively over indoor/outdoor paved surfaces at supervision to min guard assist level. 01/29/16: pt met the distance portion with supervision on indoor surfaces on this date   Status Revised     PT LONG TERM GOAL #6   Title Pt will negotiate up/down 5 steps w/ single rail at mod I level in order to indicate safe entry into home/family's homes.     Baseline 01/29/16: pt able to negotiate the stairs with 1 rail at min guard to min assist level   Status On-going            Plan - 02/04/16 1458    Clinical Impression Statement Today's skilled session continued to address  gait quality and distance, along with LE strengthening and balance activities. No issues reported during session. Pt continues to need increased assistance with balance activities with cues on posture, stance position and weight shifting. Pt is making slow, steady progress toward goals and should benefit from continued PT to progress toward unmet goals.                            Rehab Potential Excellent   PT Frequency 2x / week   PT Duration 4 weeks   PT Treatment/Interventions ADLs/Self Care Home Management;Electrical Stimulation;DME Instruction;Gait training;Stair training;Functional mobility training;Therapeutic activities;Therapeutic exercise;Balance training;Neuromuscular re-education;Patient/family education;Orthotic Fit/Training;Vestibular;Visual/perceptual remediation/compensation   PT Next Visit Plan continue to work on LE strengthening, balance and activity tolerance toward goals    Consulted and Agree with Plan of Care Patient;Family member/caregiver   Family Member Consulted daughter,       Patient will benefit from skilled therapeutic intervention in order to improve the following deficits and impairments:  Abnormal gait, Decreased activity tolerance, Decreased balance, Decreased coordination, Decreased endurance, Decreased knowledge of use of DME, Decreased mobility, Decreased strength, Dizziness, Increased edema, Impaired perceived functional ability, Impaired flexibility, Impaired UE functional use, Impaired vision/preception, Improper body mechanics, Postural dysfunction  Visit Diagnosis: Hemiparesis of right dominant side, unspecified hemiparesis etiology (HCC)  Unsteadiness on feet  Other abnormalities of gait and mobility  Muscle weakness (generalized)     Problem List Patient Active Problem List   Diagnosis Date Noted  . Somnolence, daytime 12/08/2015  . Cerebral thrombosis with cerebral infarction 09/07/2015  . Lacunar infarct, acute ([place])   . Nausea & vomiting  09/05/2015  . Acute kidney injury ([place]) 09/04/2015  . Type II diabetes mellitus with neurological manifestations, uncontrolled ([place]) 09/03/2015  . Bilateral lower extremity edema 09/03/2015  . Essential hypertension   . Diabetes mellitus type 2 in nonobese (HCC)   .  History of breast cancer   . AKI (acute kidney injury) ([place])   . Acute blood loss anemia   . Hemiparesis ([place])   . Gait disturbance, post-stroke   . Dysphagia, post-stroke   . Acute ischemic stroke/right cerebellar 08/24/2015  . Breast cancer of upper-outer quadrant of right female breast ([place]) 04/30/2015  . CVA (cerebral infarction) 07/05/2014  . Hyperlipidemia 07/05/2014  . Syncope 07/04/2014  . Mild mitral regurgitation by prior echocardiogram 12/20/2011  . Diabetes mellitus type 1, uncontrolled, insulin dependent ([place]) 02/26/2009  . OBESITY 02/26/2009  . Uncontrolled hypertension 02/26/2009  . CAD (coronary artery disease) 02/26/2009  . GASTROESOPHAGEAL REFLUX DISEASE 02/26/2009    Willow Ora, PTA, [facility] [address], [place], [place] [address] [phone] 02/05/16, 1:02 PM   Name: Megyn Leng MRN: [id number] Date of Birth: 1933/06/23

## 2016-02-06 ENCOUNTER — Ambulatory Visit: Payer: 59 | Admitting: Rehabilitation

## 2016-02-06 ENCOUNTER — Encounter: Payer: Self-pay | Admitting: Rehabilitation

## 2016-02-06 ENCOUNTER — Ambulatory Visit: Payer: 59 | Admitting: Occupational Therapy

## 2016-02-06 DIAGNOSIS — R278 Other lack of coordination: Secondary | ICD-10-CM

## 2016-02-06 DIAGNOSIS — M6281 Muscle weakness (generalized): Secondary | ICD-10-CM | POA: Diagnosis not present

## 2016-02-06 DIAGNOSIS — R2681 Unsteadiness on feet: Secondary | ICD-10-CM

## 2016-02-06 DIAGNOSIS — G8191 Hemiplegia, unspecified affecting right dominant side: Secondary | ICD-10-CM

## 2016-02-06 DIAGNOSIS — R41842 Visuospatial deficit: Secondary | ICD-10-CM

## 2016-02-06 DIAGNOSIS — R2689 Other abnormalities of gait and mobility: Secondary | ICD-10-CM

## 2016-02-06 NOTE — Therapy (Signed)
[facility] [address] [place], [place], [address] Phone: [phone]   Fax:  [phone]  Physical Therapy Treatment  Patient Details  Name: Glenda [name] MRN: [id number] Date of Birth: 1933/12/03 Referring Provider: Donnie Coffin, MD  Encounter Date: 02/06/2016      PT End of Session - 02/06/16 1629    Visit Number 26   Number of Visits 33  updated POC   Date for PT Re-Evaluation 02/29/16   Authorization Type UHC MCR- G code needed every 10th visit   PT Start Time 1407  pt late to appt   PT Stop Time 1445   PT Time Calculation (min) 38 min   Equipment Utilized During Treatment Gait belt   Activity Tolerance Patient tolerated treatment well;Patient limited by fatigue   Behavior During Therapy [facility] for tasks assessed/performed      Past Medical History:  Diagnosis Date  . Arthritis   . Breast cancer ([place])   . CAD (coronary artery disease)   . Chest pain   . Dizziness   . DM (diabetes mellitus) ([place])   . GERD (gastroesophageal reflux disease)   . HLD (hyperlipidemia)   . HTN (hypertension)   . Obesity   . Radiation 09/11/15-10/13/15   42.72 Gy to right breast, 12 Gy boost to right breast  . Shortness of breath dyspnea   . Stroke ([place])   . UTI (urinary tract infection)     Past Surgical History:  Procedure Laterality Date  . CHOLECYSTECTOMY    . colon polyps; hx    . CORONARY ANGIOPLASTY  02/14/09  . ESOPHAGOGASTRODUODENOSCOPY (EGD) WITH PROPOFOL Left 09/08/2015   Procedure: ESOPHAGOGASTRODUODENOSCOPY (EGD) WITH PROPOFOL;  Surgeon: Clarene Essex, MD;  Location: [facility] ENDOSCOPY;  Service: Endoscopy;  Laterality: Left;  . RADIOACTIVE SEED GUIDED MASTECTOMY WITH AXILLARY SENTINEL LYMPH NODE BIOPSY Right 07/17/2015   Procedure: RADIOACTIVE SEED GUIDED PARTIAL MASTECTOMY WITH AXILLARY SENTINEL LYMPH NODE BIOPSY;  Surgeon: Erroll Luna, MD;  Location: [place] [facility];  Service: General;  Laterality: Right;    There  were no vitals filed for this visit.      Subjective Assessment - 02/06/16 1411    Subjective Reports no changes, no falls.  Birthday went well and walked a lot at her party.    Patient is accompained by: Family member   Limitations Walking;House hold activities   Patient Stated Goals "I want to work on walking"    Currently in Pain? No/denies                         [facility] Adult PT Treatment/Exercise - 02/06/16 0001      Transfers   Transfers Sit to Stand;Stand to Sit   Sit to Stand 5: Supervision;4: Min guard;With upper extremity assist   Sit to Stand Details Manual facilitation for weight shifting   Sit to Stand Details (indicate cue type and reason) Pt able to stand at S level, however provided min/guard A at times to facilitate increased forward weight shift to avoid pt using backs of legs to press on mat.    Stand to Sit 5: Supervision   Stand to Sit Details Cues for controlled descent     Ambulation/Gait   Ambulation/Gait Yes   Ambulation/Gait Assistance 5: Supervision   Ambulation/Gait Assistance Details Pt ambulatory into clinic and back to therapy mat x 200' at S level with min cues for upright posture and increased B step length, esp on LLE to increase  WB through RLE.     Assistive device Rolling walker   Gait Pattern Step-through pattern;Decreased stride length;Decreased step length - right;Decreased step length - left;Decreased stance time - right;Decreased hip/knee flexion - right;Decreased weight shift to right;Shuffle;Ataxic;Trunk flexed;Narrow base of support;Poor foot clearance - right   Ambulation Surface Level;Indoor     Neuro Re-ed    Neuro Re-ed Details  Performed stepping task forwards, laterally and posteriorly over targets (theraband on floor) without AD at mod fading to min A in order to work on improved weight shifting, midline orientation and improved WB on RLE.  Utilized [employer] for [employer].  Performed x 5 reps x 2 sets with seated  rest break in between.  Note marked level of difficulty, therefore transitioned to forward and retrostepping LLE over target with mirror.  Again mod A fading to min A with great carryover and improved posture and WB on RLE.  Continued to work on balance, postural control and improved weight shift/WB on RLE with gait without AD x 115' x 1 rep at mod fading to min A with cues for slowed gait pattern, keeping LEs under trunk, as she tends to lose balance and leads with trunk.  Also facilitation for improved R forward/lateral weight shift and cues for upright posture.  Ended with coutner top activities; side stepping as in HEP with cues for posture and increased step length x 2 reps down and back.  Standing marching with single UE support on counter and PT proviiding other UE support x 10 reps on each side.                  PT Education - 02/06/16 1628    Education provided Yes   Education Details importance of WB on RLE   Person(s) Educated Patient;Child(ren)   Methods Explanation   Comprehension Verbalized understanding          PT Short Term Goals - 12/03/15 1107      PT SHORT TERM GOAL #1   Title Pt will initiate HEP for strengthening and balance in order to improve functional mobility.  (Target Date: 11/28/15)   Baseline 12/03/15: met today after reinstruction with pictures to daughter/pt   Status Achieved     PT SHORT TERM GOAL #2   Title Pt will improve TUG score to <80 seconds in order to indicate decreased fall risk.     Baseline 11/25/15: 52.44 sec's with RW and simulated toe cap    Status Achieved     PT SHORT TERM GOAL #3   Title Will assess BERG and improve score by 4 points in order to indicate decreased fall risk and functional improvement in balance.     Baseline 11/27/15: 17/56 today (was 12/56 at baseline)   Status Achieved     PT SHORT TERM GOAL #4   Title Pt will ambulate over indoor surfaces x 150' w/ RW at S level in order to indicate safe negotiation in home.      Baseline 11/25/15: pt able to ambulate this distance with varied amout of assistance (min guard to min assist)   Status Partially Met     PT SHORT TERM GOAL #5   Title Pt will verbalize fall prevention strategies in order to indicate decreased fall risk inside and outside of home.     Baseline 12/03/15: fall prevention strategies issued today with review with pt and daughter   Status Partially Met     PT SHORT TERM GOAL #6   Title Pt  will perform 8/10 sit <>stands with single UE support only at mod I level in order to indicate improved functional strength.     Baseline 12/03/15: pt continues to need min>mod assist to power up and to control descent with sitting down using 1-2 UE's to assist.   Status Not Met           PT Long Term Goals - 01/30/16 1058      PT LONG TERM GOAL #1   Title Pt will be independent with HEP in order to indicate improved functional mobility and decreased fall risk.  (Modified Target Date: 02/27/16)   Baseline 01/29/16: met per pt and daughter's report - reports doing them "occasionally". both report doing the walking program daily.   Status Achieved     PT LONG TERM GOAL #2   Title Pt will improve TUG score to <30 secs in order to indicate decreased fall risk.     Baseline 01/27/16: 37.2 sec's with RW today   Status Revised     PT LONG TERM GOAL #3   Title Pt will improve BERG balance score to 32/56 in order to indicate decreased fall risk.     Baseline 01/27/16: 28/56 today (was 10/56 on 12/25/15)   Status Revised     PT LONG TERM GOAL #4   Title Pt will perform 8/10 sit<>stand without UE support at mod I level in order to indicate improved functional strength.     Baseline 01/27/16: pt continues to require UE support to stand standard height surfaces and lower   Status On-going     PT LONG TERM GOAL #5   Title Pt will ambulate 500' w/ LRAD over paved outdoor surfaces at mod I level in order to indicate safe return to community.     Baseline 01/27/16: able to  ambulate up to 225 ft consecutively over indoor/outdoor paved surfaces at supervision to min guard assist level. 01/29/16: pt met the distance portion with supervision on indoor surfaces on this date   Status Revised     PT LONG TERM GOAL #6   Title Pt will negotiate up/down 5 steps w/ single rail at mod I level in order to indicate safe entry into home/family's homes.     Baseline 01/29/16: pt able to negotiate the stairs with 1 rail at min guard to min assist level   Status On-going               Plan - 02/06/16 2020    Clinical Impression Statement Skilled session focused on NMR for RLE, postrual control and midline orientation.  Tolerated well but with increased fatigue.  Continue to encourage walking program to improve endurance.      Rehab Potential Excellent   PT Frequency 2x / week   PT Duration 4 weeks   PT Treatment/Interventions ADLs/Self Care Home Management;Electrical Stimulation;DME Instruction;Gait training;Stair training;Functional mobility training;Therapeutic activities;Therapeutic exercise;Balance training;Neuromuscular re-education;Patient/family education;Orthotic Fit/Training;Vestibular;Visual/perceptual remediation/compensation   PT Next Visit Plan continue to work on LE strengthening, balance and activity tolerance toward goals    Consulted and Agree with Plan of Care Patient;Family member/caregiver   Family Member Consulted daughter,       Patient will benefit from skilled therapeutic intervention in order to improve the following deficits and impairments:  Abnormal gait, Decreased activity tolerance, Decreased balance, Decreased coordination, Decreased endurance, Decreased knowledge of use of DME, Decreased mobility, Decreased strength, Dizziness, Increased edema, Impaired perceived functional ability, Impaired flexibility, Impaired UE functional use, Impaired vision/preception, Improper body mechanics,  Postural dysfunction  Visit Diagnosis: Hemiparesis of  right dominant side, unspecified hemiparesis etiology (HCC)  Unsteadiness on feet  Muscle weakness (generalized)  Other abnormalities of gait and mobility     Problem List Patient Active Problem List   Diagnosis Date Noted  . Somnolence, daytime 12/08/2015  . Cerebral thrombosis with cerebral infarction 09/07/2015  . Lacunar infarct, acute ([place])   . Nausea & vomiting 09/05/2015  . Acute kidney injury ([place]) 09/04/2015  . Type II diabetes mellitus with neurological manifestations, uncontrolled ([place]) 09/03/2015  . Bilateral lower extremity edema 09/03/2015  . Essential hypertension   . Diabetes mellitus type 2 in nonobese (HCC)   . History of breast cancer   . AKI (acute kidney injury) ([place])   . Acute blood loss anemia   . Hemiparesis ([place])   . Gait disturbance, post-stroke   . Dysphagia, post-stroke   . Acute ischemic stroke/right cerebellar 08/24/2015  . Breast cancer of upper-outer quadrant of right female breast ([place]) 04/30/2015  . CVA (cerebral infarction) 07/05/2014  . Hyperlipidemia 07/05/2014  . Syncope 07/04/2014  . Mild mitral regurgitation by prior echocardiogram 12/20/2011  . Diabetes mellitus type 1, uncontrolled, insulin dependent ([place]) 02/26/2009  . OBESITY 02/26/2009  . Uncontrolled hypertension 02/26/2009  . CAD (coronary artery disease) 02/26/2009  . GASTROESOPHAGEAL REFLUX DISEASE 02/26/2009   Cameron Sprang, PT, MPT [facility] [address] [place], [place], [address] Phone: [phone]   Fax:  [phone] 02/06/16, 8:23 PM  Name: Glenda [name] MRN: [id number] Date of Birth: [birth year]

## 2016-02-06 NOTE — Patient Instructions (Signed)
Encourage Glenda [name] to use her right hand to pick up lightweight objects that are not hot, sharp, heavy or breakable. Have her practice folding laundry, (if standing, someone should be next to her.) Have her reaching into cabinets to retrieve lightweight objects (that are not breakable with her left hand.) Someone needs to be standing next to her when she is standing for safety.    Instructions  Encourage Glenda [name] to use her right hand to pick up lightweight objects that are not hot, sharp, heavy or breakable. Have her practice folding laundry, (if standing, someone should be next to her.) Have her reaching into cabinets to retrieve lightweight objects (that are not breakable with her left hand.) Someone needs to be standing next to her when she is standing for safety         Seated hold a ball between your hands , raise arms up to just above shoulder height without elevating shoulder or arching back,then  lower ball back to your lap. Keep elbows straight.   Perform 2 sets of 10 reps 1x day  Seated hold a ball in between both hands, straighten elbows, then bend elbows and bring ball to your chest     Perform 2 sets of 10 reps 1x day   Seated hold ball between both hands, move up to left and down to the right, 2 sets of 10 reps 1x day, repeat to other side.

## 2016-02-06 NOTE — Therapy (Signed)
[facility] [address] [place], [place], [address] Phone: [phone]   Fax:  [phone]  Occupational Therapy Treatment  Patient Details  Name: Glenda [name] MRN: [id number] Date of Birth: 19-Dec-1933 Referring Provider: Dr. Donnie Coffin  Encounter Date: 02/06/2016    Past Medical History:  Diagnosis Date  . Arthritis   . Breast cancer ([place])   . CAD (coronary artery disease)   . Chest pain   . Dizziness   . DM (diabetes mellitus) ([place])   . GERD (gastroesophageal reflux disease)   . HLD (hyperlipidemia)   . HTN (hypertension)   . Obesity   . Radiation 09/11/15-10/13/15   42.72 Gy to right breast, 12 Gy boost to right breast  . Shortness of breath dyspnea   . Stroke ([place])   . UTI (urinary tract infection)     Past Surgical History:  Procedure Laterality Date  . CHOLECYSTECTOMY    . colon polyps; hx    . CORONARY ANGIOPLASTY  02/14/09  . ESOPHAGOGASTRODUODENOSCOPY (EGD) WITH PROPOFOL Left 09/08/2015   Procedure: ESOPHAGOGASTRODUODENOSCOPY (EGD) WITH PROPOFOL;  Surgeon: Clarene Essex, MD;  [facility] END[facility]COPY;  Service: Endoscopy;  Laterality: Left;  . RADIOACTIVE SEED GUIDED MASTECTOMY WITH AXILLARY SENTINEL LYMPH NODE BIOPSY Right 07/17/2015   Procedure: RADIOACTIVE SEED GUIDED PARTIAL MASTECTOMY WITH AXILLARY SENTINEL LYMPH NODE BIOPSY;  Surgeon: Erroll Luna, MD;  Location: [facility];  Service: General;  Laterality: Right;    There were no vitals filed for this visit.      Subjective Assessment - 02/06/16 1500    Patient Stated Goals To do everything I did before   Currently in Pain? No/denies               Seated functional mid range reaching with RUE to place large pegs in pegboard.                OT Education - 02/06/16 1543    Education provided Yes   Education Details Ball ex in seated( 2 sets x10 reps each) , reissued instructions regarding functional use of RUE   Person(s) Educated Patient   Methods Explanation;Demonstration;Verbal cues;Handout   Comprehension Verbalized understanding;Returned demonstration          OT Short Term Goals - 01/22/16 1630      OT SHORT TERM GOAL #1   Title I with inital HEP.   Time 4   Period Weeks   Status Achieved     OT SHORT TERM GOAL #2   Title Pt will demonstrate ability to retrieve a lightweight object at 110* with RUE.   Time 4   Period Weeks   Status On-going  100*     OT SHORT TERM GOAL #3   Title Pt will demonstrate improved fine motor coordination as evidenced by decreasing 9 hole peg test score by 4 secs bilaterally.   Status Partially Met  met for LUE only 36.40, RUE 75.72, 69.09     OT SHORT TERM GOAL #4   Title Pt will perform simple snack prep or light home management task in standing with min A without LOB demonstrating good safety awareness.   Time 4   Period Weeks   Status Achieved     OT SHORT TERM GOAL #5   Title Pt will demonstrate ability to write a short paragraph with 95% legibility.   Time 4   Period Weeks   Status On-going  not consistent, 80-95% legibility     OT SHORT TERM  GOAL #6   Title Further assess cognition and set goal PRN   Time 4   Period Weeks   Status Deferred     OT SHORT TERM GOAL #7   Title Pt will demonstrate ability to navigate a busy environment and locate items with 80% accuracy.   Time 4   Period Weeks   Status On-going     OT SHORT TERM GOAL #8   Title Pt demonstrate improved fine motor coordination as evidenced by perfoming 9 hole peg test in 63 secs or less with RUE    Baseline 75.72, 69.09   Time 4   Period Weeks   Status Achieved  54.37 secs           OT Long Term Goals - 01/08/16 1551      OT LONG TERM GOAL #1   Title I with updated HEP.   Time 8   Period Weeks   Status On-going  previous HEP still appropriate      OT LONG TERM GOAL #2   Title Pt will perform simple cooking task in standing with supervision  demonstrating good safety awareness.- Revised goal-Pt will perform simple cooking task in standing with min A.   Time 8   Period Weeks   Status Revised  not fully addressed due to pt's decline since TIA     OT LONG TERM GOAL #3   Title Pt will demonstrate ability to retrieve a light weight object at 120 shoulder flexion with RUE   Time 8   Period Weeks   Status On-going  95-100     OT LONG TERM GOAL #4   Title Pt will resume use of RUE as dominant hand at least 75% of the time for ADLs/IADLs.   Time 8   Period Weeks   Status On-going  50%     OT LONG TERM GOAL #5   Title Pt will decrease RUE 9 hole peg test score to 49 secs from for increased fine motor coordination. - Revised pt will complete 9 hole peg test in 54 secs or less with RUE.   Status Revised  75.72, 69.09     OT LONG TERM GOAL #6   Title Pt will be modified indpendent with all basic ADLs.   Time 8   Period Weeks   Status Achieved  except for supervision with shower transfers               Plan - 02/06/16 1541    Clinical Impression Statement Pt is progressing towards goals for RUE functional use. Pt reports putting away groceries (with assistance) this week.   Rehab Potential Good   OT Frequency 2x / week   OT Duration 8 weeks   OT Treatment/Interventions Self-care/ADL training;Therapeutic exercise;Patient/family education;Functional Mobility Training;Neuromuscular education;Manual Therapy;Therapeutic exercises;DME and/or AE instruction;Therapeutic activities;Electrical Stimulation;Fluidtherapy;Cognitive remediation/compensation;Moist Heat;Passive range of motion;Visual/perceptual remediation/compensation   Plan standing balance, functional use of RUE.   Consulted and Agree with Plan of Care Patient;Family member/caregiver   Family Member Consulted daughter      Patient will benefit from skilled therapeutic intervention in order to improve the following deficits and impairments:  Abnormal gait,  Decreased coordination, Decreased range of motion, Difficulty walking, Decreased safety awareness, Decreased endurance, Decreased activity tolerance, Decreased knowledge of precautions, Impaired UE functional use, Decreased knowledge of use of DME, Decreased balance, Decreased cognition, Decreased mobility, Decreased strength, Impaired perceived functional ability  Visit Diagnosis: Hemiparesis of right dominant side, unspecified hemiparesis etiology (HCC)  Visuospatial deficit  Unsteadiness on feet  Muscle weakness (generalized)  Other lack of coordination    Problem List Patient Active Problem List   Diagnosis Date Noted  . Somnolence, daytime 12/08/2015  . Cerebral thrombosis with cerebral infarction 09/07/2015  . Lacunar infarct, acute ([place])   . Nausea & vomiting 09/05/2015  . Acute kidney injury ([place]) 09/04/2015  . Type II diabetes mellitus with neurological manifestations, uncontrolled ([place]) 09/03/2015  . Bilateral lower extremity edema 09/03/2015  . Essential hypertension   . Diabetes mellitus type 2 in nonobese (HCC)   . History of breast cancer   . AKI (acute kidney injury) ([place])   . Acute blood loss anemia   . Hemiparesis ([place])   . Gait disturbance, post-stroke   . Dysphagia, post-stroke   . Acute ischemic stroke/right cerebellar 08/24/2015  . Breast cancer of upper-outer quadrant of right female breast ([place]) 04/30/2015  . CVA (cerebral infarction) 07/05/2014  . Hyperlipidemia 07/05/2014  . Syncope 07/04/2014  . Mild mitral regurgitation by prior echocardiogram 12/20/2011  . Diabetes mellitus type 1, uncontrolled, insulin dependent ([place]) 02/26/2009  . OBESITY 02/26/2009  . Uncontrolled hypertension 02/26/2009  . CAD (coronary artery disease) 02/26/2009  . GASTROESOPHAGEAL REFLUX DISEASE 02/26/2009    Ariauna Farabee 02/06/2016, 3:45 PM  [facility] [address] [place], [place], [address] Phone:  [phone]   Fax:  [phone]  Name: Glenda [name] MRN: [id number] Date of Birth: 1934/03/25

## 2016-02-10 ENCOUNTER — Ambulatory Visit: Payer: 59 | Admitting: Physical Therapy

## 2016-02-10 ENCOUNTER — Ambulatory Visit: Payer: 59 | Admitting: Occupational Therapy

## 2016-02-10 ENCOUNTER — Encounter: Payer: Self-pay | Admitting: Physical Therapy

## 2016-02-10 VITALS — BP 165/67

## 2016-02-10 DIAGNOSIS — R2681 Unsteadiness on feet: Secondary | ICD-10-CM

## 2016-02-10 DIAGNOSIS — M6281 Muscle weakness (generalized): Secondary | ICD-10-CM

## 2016-02-10 DIAGNOSIS — R2689 Other abnormalities of gait and mobility: Secondary | ICD-10-CM

## 2016-02-10 DIAGNOSIS — G8191 Hemiplegia, unspecified affecting right dominant side: Secondary | ICD-10-CM

## 2016-02-10 DIAGNOSIS — R278 Other lack of coordination: Secondary | ICD-10-CM

## 2016-02-10 DIAGNOSIS — R41842 Visuospatial deficit: Secondary | ICD-10-CM

## 2016-02-10 NOTE — Therapy (Signed)
[facility] [address] [place], [place], [address] Phone: [phone]   Fax:  [phone]  Occupational Therapy Treatment  Patient Details  Name: Glenda [name] MRN: [id number] Date of Birth: 10/25/1933 Referring Provider: Dr. Donnie Coffin  Encounter Date: 02/10/2016      OT End of Session - 02/10/16 1348    Visit Number 25   Date for OT Re-Evaluation 03/07/16   Authorization Type [employer] MCR- short term goals due 02/06/16   Authorization - Visit Number 25   Authorization - Number of Visits 30   OT Start Time 1316   OT Stop Time 1400   OT Time Calculation (min) 44 min   Activity Tolerance Patient tolerated treatment well   Behavior During Therapy [facility] for tasks assessed/performed      Past Medical History:  Diagnosis Date  . Arthritis   . Breast cancer ([place])   . CAD (coronary artery disease)   . Chest pain   . Dizziness   . DM (diabetes mellitus) ([place])   . GERD (gastroesophageal reflux disease)   . HLD (hyperlipidemia)   . HTN (hypertension)   . Obesity   . Radiation 09/11/15-10/13/15   42.72 Gy to right breast, 12 Gy boost to right breast  . Shortness of breath dyspnea   . Stroke ([place])   . UTI (urinary tract infection)     Past Surgical History:  Procedure Laterality Date  . CHOLECYSTECTOMY    . colon polyps; hx    . CORONARY ANGIOPLASTY  02/14/09  . ESOPHAGOGASTRODUODENOSCOPY (EGD) WITH PROPOFOL Left 09/08/2015   Procedure: ESOPHAGOGASTRODUODENOSCOPY (EGD) WITH PROPOFOL;  Surgeon: Clarene Essex, MD;  Location: [facility] ENDOSCOPY;  Service: Endoscopy;  Laterality: Left;  . RADIOACTIVE SEED GUIDED MASTECTOMY WITH AXILLARY SENTINEL LYMPH NODE BIOPSY Right 07/17/2015   Procedure: RADIOACTIVE SEED GUIDED PARTIAL MASTECTOMY WITH AXILLARY SENTINEL LYMPH NODE BIOPSY;  Surgeon: Erroll Luna, MD;  Location: [facility];  Service: General;  Laterality: Right;    Vitals:   02/10/16 1328  BP: (!) 165/67         Subjective Assessment - 02/10/16 1341    Subjective  Pt reports she had a headache today and took some sinus medication   Pertinent History CVA 08/24/15   Patient Stated Goals To do everything I did before   Currently in Pain? No/denies         Treatment: Pt/ dtr were cautioned to talk with MD regarding safe sinus meds to take with current medications. Dynamic standing with functional reaching to place graded clothespins on vertical antennae with RUE, mod facilitation  For weightshift over RLE Reviewed 10 reps each seated ball ex from HEP, min v.c./ facilitation Digital finger control worksheet in prep for handwriting, min-mod v.c.                     OT Education - 02/10/16 1348    Education provided Yes   Education Details Reviewed ball exercises from HEP 10 reps each   Person(s) Educated Patient;Child(ren)   Methods Explanation;Demonstration;Verbal cues   Comprehension Verbalized understanding;Returned demonstration          OT Short Term Goals - 01/22/16 1630      OT SHORT TERM GOAL #1   Title I with inital HEP.   Time 4   Period Weeks   Status Achieved     OT SHORT TERM GOAL #2   Title Pt will demonstrate ability to retrieve a lightweight object at 110* with RUE.  Time 4   Period Weeks   Status On-going  100*     OT SHORT TERM GOAL #3   Title Pt will demonstrate improved fine motor coordination as evidenced by decreasing 9 hole peg test score by 4 secs bilaterally.   Status Partially Met  met for LUE only 36.40, RUE 75.72, 69.09     OT SHORT TERM GOAL #4   Title Pt will perform simple snack prep or light home management task in standing with min A without LOB demonstrating good safety awareness.   Time 4   Period Weeks   Status Achieved     OT SHORT TERM GOAL #5   Title Pt will demonstrate ability to write a short paragraph with 95% legibility.   Time 4   Period Weeks   Status On-going  not consistent, 80-95% legibility     OT SHORT  TERM GOAL #6   Title Further assess cognition and set goal PRN   Time 4   Period Weeks   Status Deferred     OT SHORT TERM GOAL #7   Title Pt will demonstrate ability to navigate a busy environment and locate items with 80% accuracy.   Time 4   Period Weeks   Status On-going     OT SHORT TERM GOAL #8   Title Pt demonstrate improved fine motor coordination as evidenced by perfoming 9 hole peg test in 63 secs or less with RUE    Baseline 75.72, 69.09   Time 4   Period Weeks   Status Achieved  54.37 secs           OT Long Term Goals - 01/08/16 1551      OT LONG TERM GOAL #1   Title I with updated HEP.   Time 8   Period Weeks   Status On-going  previous HEP still appropriate      OT LONG TERM GOAL #2   Title Pt will perform simple cooking task in standing with supervision demonstrating good safety awareness.- Revised goal-Pt will perform simple cooking task in standing with min A.   Time 8   Period Weeks   Status Revised  not fully addressed due to pt's decline since TIA     OT LONG TERM GOAL #3   Title Pt will demonstrate ability to retrieve a light weight object at 120 shoulder flexion with RUE   Time 8   Period Weeks   Status On-going  95-100     OT LONG TERM GOAL #4   Title Pt will resume use of RUE as dominant hand at least 75% of the time for ADLs/IADLs.   Time 8   Period Weeks   Status On-going  50%     OT LONG TERM GOAL #5   Title Pt will decrease RUE 9 hole peg test score to 49 secs from for increased fine motor coordination. - Revised pt will complete 9 hole peg test in 54 secs or less with RUE.   Status Revised  75.72, 69.09     OT LONG TERM GOAL #6   Title Pt will be modified indpendent with all basic ADLs.   Time 8   Period Weeks   Status Achieved  except for supervision with shower transfers               Plan - 02/10/16 1341    Clinical Impression Statement Pt is progressing towards goals for standing balance and RUE functional  use.   Rehab  Potential Good   OT Frequency 2x / week   OT Duration 8 weeks   Plan standing balance, functional use RUE   Consulted and Agree with Plan of Care Patient;Family member/caregiver   Family Member Consulted daughter      Patient will benefit from skilled therapeutic intervention in order to improve the following deficits and impairments:  Abnormal gait, Decreased coordination, Decreased range of motion, Difficulty walking, Decreased safety awareness, Decreased endurance, Decreased activity tolerance, Decreased knowledge of precautions, Impaired UE functional use, Decreased knowledge of use of DME, Decreased balance, Decreased cognition, Decreased mobility, Decreased strength, Impaired perceived functional ability  Visit Diagnosis: Visuospatial deficit  Hemiparesis of right dominant side, unspecified hemiparesis etiology (HCC)  Unsteadiness on feet  Muscle weakness (generalized)  Other lack of coordination  Other abnormalities of gait and mobility    Problem List Patient Active Problem List   Diagnosis Date Noted  . Somnolence, daytime 12/08/2015  . Cerebral thrombosis with cerebral infarction 09/07/2015  . Lacunar infarct, acute ([place])   . Nausea & vomiting 09/05/2015  . Acute kidney injury ([place]) 09/04/2015  . Type II diabetes mellitus with neurological manifestations, uncontrolled ([place]) 09/03/2015  . Bilateral lower extremity edema 09/03/2015  . Essential hypertension   . Diabetes mellitus type 2 in nonobese (HCC)   . History of breast cancer   . AKI (acute kidney injury) ([place])   . Acute blood loss anemia   . Hemiparesis ([place])   . Gait disturbance, post-stroke   . Dysphagia, post-stroke   . Acute ischemic stroke/right cerebellar 08/24/2015  . Breast cancer of upper-outer quadrant of right female breast ([place]) 04/30/2015  . CVA (cerebral infarction) 07/05/2014  . Hyperlipidemia 07/05/2014  . Syncope 07/04/2014  . Mild mitral regurgitation by prior  echocardiogram 12/20/2011  . Diabetes mellitus type 1, uncontrolled, insulin dependent ([place]) 02/26/2009  . OBESITY 02/26/2009  . Uncontrolled hypertension 02/26/2009  . CAD (coronary artery disease) 02/26/2009  . GASTROESOPHAGEAL REFLUX DISEASE 02/26/2009    Glenda [name] 02/10/2016, 1:59 PM  Theone Murdoch, OTR/L [phone] Phone: [phone] 2:04 PM 02/10/16  [facility] [address] [place], [place], [address] Phone: [phone]   Fax:  [phone]  Name: Glenda [name] MRN: [id number] Date of Birth: [birth year]

## 2016-02-11 NOTE — Therapy (Signed)
[facility] [address] [place], [place], [address] Phone: [phone]   Fax:  [phone]  Physical Therapy Treatment  Patient Details  Name: Glenda [name] MRN: [id number] Date of Birth: 1933-11-08 Referring Provider: Donnie Coffin, MD  Encounter Date: 02/10/2016   02/10/16 1413  PT Visits / Re-Eval  Visit Number 27  Number of Visits 33 (updated POC)  Date for PT Re-Evaluation 02/29/16  Authorization  Authorization Type [employer] MCR- G code needed every 10th visit  PT Time Calculation  PT Start Time 1405  PT Stop Time 1445  PT Time Calculation (min) 40 min  PT - End of Session  Equipment Utilized During Treatment Gait belt  Activity Tolerance Patient tolerated treatment well;Patient limited by fatigue  Behavior During Therapy [facility] for tasks assessed/performed     Past Medical History:  Diagnosis Date  . Arthritis   . Breast cancer ([place])   . CAD (coronary artery disease)   . Chest pain   . Dizziness   . DM (diabetes mellitus) ([place])   . GERD (gastroesophageal reflux disease)   . HLD (hyperlipidemia)   . HTN (hypertension)   . Obesity   . Radiation 09/11/15-10/13/15   42.72 Gy to right breast, 12 Gy boost to right breast  . Shortness of breath dyspnea   . Stroke ([place])   . UTI (urinary tract infection)     Past Surgical History:  Procedure Laterality Date  . CHOLECYSTECTOMY    . colon polyps; hx    . CORONARY ANGIOPLASTY  02/14/09  . ESOPHAGOGASTRODUODENOSCOPY (EGD) WITH PROPOFOL Left 09/08/2015   Procedure: ESOPHAGOGASTRODUODENOSCOPY (EGD) WITH PROPOFOL;  Surgeon: Clarene Essex, MD;  Location[facility] ENDOSCOPY;  Service: Endoscopy;  Laterality: Left;  . RADIOACTIVE SEED GUIDED MASTECTOMY WITH AXILLARY SENTINEL LYMPH NODE BIOPSY Right 07/17/2015   Procedure: RADIOACTIVE SEED GUIDED PARTIAL MASTECTOMY WITH AXILLARY SENTINEL LYMPH NODE BIOPSY;  Surgeon: Erroll Luna, MD;  Location: [facility];  Service: General;   Laterality: Right;    There were no vitals filed for this visit.     02/10/16 1412  Symptoms/Limitations  Subjective No new falls to report. Denies any pain to day. Did have a headache this am that she took some sinus medication for, it's gone now.   Patient is accompained by: Family member  Limitations Walking;House hold activities  Patient Stated Goals "I want to work on walking"   Pain Assessment  Currently in Pain? No/denies  Pain Score 0      02/10/16 1429  Transfers  Transfers Sit to Stand;Stand to Sit  Sit to Stand 5: Supervision;4: Min guard;With upper extremity assist;From bed;From chair/3-in-1  Sit to Stand Details Verbal cues for sequencing;Verbal cues for technique;Verbal cues for precautions/safety;Verbal cues for safe use of DME/AE  Sit to Stand Details (indicate cue type and reason) cues for anterior weight shifting and for hand placement  Stand to Sit 5: Supervision;With upper extremity assist;To bed  Stand to Sit Details (indicate cue type and reason) Verbal cues for precautions/safety;Verbal cues for safe use of DME/AE  Stand to Sit Details occasional cues needed to fully turn to surface before sitting down and to reach back with sitting down  Ambulation/Gait  Ambulation/Gait Yes  Ambulation/Gait Assistance 5: Supervision  Ambulation/Gait Assistance Details occasional cues needed for increased step length and foot clearance with swing phase                          Ambulation Distance (Feet)  230 Feet (x1, 115 x1)  Assistive device Rolling walker  Gait Pattern Step-through pattern;Decreased stride length;Decreased step length - right;Decreased step length - left;Decreased stance time - right;Decreased hip/knee flexion - right;Decreased weight shift to right;Shuffle;Ataxic;Trunk flexed;Narrow base of support;Poor foot clearance - right  Ambulation Surface Level;Indoor  Neuro Re-ed   Neuro Re-ed Details  standing with left UE on PTA shoulder (PTA in front of pt):  right stance with the following; left stepping over/back of half black foam roll x 8 reps; left step/foot taps to 6 inch box x 10 reps. cues/facilitaiton needed for weight shifting and tall/upright posture.                                PT Short Term Goals - 12/03/15 1107      PT SHORT TERM GOAL #1   Title Pt will initiate HEP for strengthening and balance in order to improve functional mobility.  (Target Date: 11/28/15)   Baseline 12/03/15: met today after reinstruction with pictures to daughter/pt   Status Achieved     PT SHORT TERM GOAL #2   Title Pt will improve TUG score to <80 seconds in order to indicate decreased fall risk.     Baseline 11/25/15: 52.44 sec's with RW and simulated toe cap    Status Achieved     PT SHORT TERM GOAL #3   Title Will assess BERG and improve score by 4 points in order to indicate decreased fall risk and functional improvement in balance.     Baseline 11/27/15: 17/56 today (was 12/56 at baseline)   Status Achieved     PT SHORT TERM GOAL #4   Title Pt will ambulate over indoor surfaces x 150' w/ RW at S level in order to indicate safe negotiation in home.     Baseline 11/25/15: pt able to ambulate this distance with varied amout of assistance (min guard to min assist)   Status Partially Met     PT SHORT TERM GOAL #5   Title Pt will verbalize fall prevention strategies in order to indicate decreased fall risk inside and outside of home.     Baseline 12/03/15: fall prevention strategies issued today with review with pt and daughter   Status Partially Met     PT SHORT TERM GOAL #6   Title Pt will perform 8/10 sit <>stands with single UE support only at mod I level in order to indicate improved functional strength.     Baseline 12/03/15: pt continues to need min>mod assist to power up and to control descent with sitting down using 1-2 UE's to assist.   Status Not Met           PT Long Term Goals - 01/30/16 1058      PT LONG TERM GOAL #1   Title Pt will  be independent with HEP in order to indicate improved functional mobility and decreased fall risk.  (Modified Target Date: 02/27/16)   Baseline 01/29/16: met per pt and daughter's report - reports doing them "occasionally". both report doing the walking program daily.   Status Achieved     PT LONG TERM GOAL #2   Title Pt will improve TUG score to <30 secs in order to indicate decreased fall risk.     Baseline 01/27/16: 37.2 sec's with RW today   Status Revised     PT LONG TERM GOAL #3   Title Pt will improve BERG  balance score to 32/56 in order to indicate decreased fall risk.     Baseline 01/27/16: 28/56 today (was 10/56 on 12/25/15)   Status Revised     PT LONG TERM GOAL #4   Title Pt will perform 8/10 sit<>stand without UE support at mod I level in order to indicate improved functional strength.     Baseline 01/27/16: pt continues to require UE support to stand standard height surfaces and lower   Status On-going     PT LONG TERM GOAL #5   Title Pt will ambulate 500' w/ LRAD over paved outdoor surfaces at mod I level in order to indicate safe return to community.     Baseline 01/27/16: able to ambulate up to 225 ft consecutively over indoor/outdoor paved surfaces at supervision to min guard assist level. 01/29/16: pt met the distance portion with supervision on indoor surfaces on this date   Status Revised     PT LONG TERM GOAL #6   Title Pt will negotiate up/down 5 steps w/ single rail at mod I level in order to indicate safe entry into home/family's homes.     Baseline 01/29/16: pt able to negotiate the stairs with 1 rail at min guard to min assist level   Status On-going        02/10/16 1413  Plan  Clinical Impression Statement Today's skilled session continued to focus on gait, activity tolerance and right LE weight shifting/balance activities.  Pt continues to make steady progress toward goals and should benefit from continued PT to progress toward unmet goals.                                                   Pt will benefit from skilled therapeutic intervention in order to improve on the following deficits Abnormal gait;Decreased activity tolerance;Decreased balance;Decreased coordination;Decreased endurance;Decreased knowledge of use of DME;Decreased mobility;Decreased strength;Dizziness;Increased edema;Impaired perceived functional ability;Impaired flexibility;Impaired UE functional use;Impaired vision/preception;Improper body mechanics;Postural dysfunction  Rehab Potential Excellent  PT Frequency 2x / week  PT Duration 4 weeks  PT Treatment/Interventions ADLs/Self Care Home Management;Electrical Stimulation;DME Instruction;Gait training;Stair training;Functional mobility training;Therapeutic activities;Therapeutic exercise;Balance training;Neuromuscular re-education;Patient/family education;Orthotic Fit/Training;Vestibular;Visual/perceptual remediation/compensation  PT Next Visit Plan continue to work on LE strengthening, balance and activity tolerance toward goals   Consulted and Agree with Plan of Care Patient;Family member/caregiver  Family Member Consulted daughter,      Patient will benefit from skilled therapeutic intervention in order to improve the following deficits and impairments:  Abnormal gait, Decreased activity tolerance, Decreased balance, Decreased coordination, Decreased endurance, Decreased knowledge of use of DME, Decreased mobility, Decreased strength, Dizziness, Increased edema, Impaired perceived functional ability, Impaired flexibility, Impaired UE functional use, Impaired vision/preception, Improper body mechanics, Postural dysfunction  Visit Diagnosis: Unsteadiness on feet  Muscle weakness (generalized)  Other abnormalities of gait and mobility     Problem List Patient Active Problem List   Diagnosis Date Noted  . Somnolence, daytime 12/08/2015  . Cerebral thrombosis with cerebral infarction 09/07/2015  . Lacunar infarct, acute ([place])   .  Nausea & vomiting 09/05/2015  . Acute kidney injury ([place]) 09/04/2015  . Type II diabetes mellitus with neurological manifestations, uncontrolled ([place]) 09/03/2015  . Bilateral lower extremity edema 09/03/2015  . Essential hypertension   . Diabetes mellitus type 2 in nonobese ([facility])   . History of breast cancer   .  AKI (acute kidney injury) ([place])   . Acute blood loss anemia   . Hemiparesis ([place])   . Gait disturbance, post-stroke   . Dysphagia, post-stroke   . Acute ischemic stroke/right cerebellar 08/24/2015  . Breast cancer of upper-outer quadrant of right female breast ([place]) 04/30/2015  . CVA (cerebral infarction) 07/05/2014  . Hyperlipidemia 07/05/2014  . Syncope 07/04/2014  . Mild mitral regurgitation by prior echocardiogram 12/20/2011  . Diabetes mellitus type 1, uncontrolled, insulin dependent ([place]) 02/26/2009  . OBESITY 02/26/2009  . Uncontrolled hypertension 02/26/2009  . CAD (coronary artery disease) 02/26/2009  . GASTROESOPHAGEAL REFLUX DISEASE 02/26/2009    Willow Ora, PTA, [facility] [address] [place], [place] [address] [phone] 02/11/16, 11:56 PM   Name: Miyonna Ormiston MRN: [id number] Date of Birth: Feb 24, 1934

## 2016-02-12 ENCOUNTER — Telehealth: Payer: Self-pay | Admitting: Neurology

## 2016-02-12 DIAGNOSIS — G4733 Obstructive sleep apnea (adult) (pediatric): Secondary | ICD-10-CM

## 2016-02-12 NOTE — Progress Notes (Signed)
PATIENT'S NAME:  Glenda [name], Glenda [name] DOB:      June 11, 1933      MR#:    [id number]     DATE OF RECORDING: 02/03/2016 REFERRING M.D.:  Dr. Mamie Nick Silverio Lay, NP, PCP: Dr. Donnie Coffin Study Performed:  Split-Night Titration Study HISTORY: 82 year old woman with an underlying medical history of arthritis, breast cancer, status post right mastectomy in March 2017, and radiation therapy in May and June 2017, coronary artery disease, status post angioplasty in 2010, type 2 diabetes, reflux disease, hyperlipidemia, hypertension, overweight state, and ischemic stroke of the right cerebellar peduncle in April 2017, who reports snoring and excessive daytime somnolence. The patient endorsed the Epworth Sleepiness Scale at 9/24 points. The patient's weight 150 pounds with a height of 66 (inches), resulting in a BMI of 24.1 kg/m2. The patient's neck circumference measured 15 inches.  CURRENT MEDICATIONS: Norvasc, Aspirin, Klonopin, Catapres, Plavix, Lasix, Humalog, Radiaplexrx, Bystolic, Protonix, Klor-Con, Pravachol, Toujeo Solostar, Diovan.    PROCEDURE:  This is a multichannel digital polysomnogram utilizing the Somnostar 11.2 system.  Electrodes and sensors were applied and monitored per AASM Specifications.   EEG, EOG, Chin and Limb EMG, were sampled at 200 Hz.  ECG, Snore and Nasal Pressure, Thermal Airflow, Respiratory Effort, CPAP Flow and Pressure, Oximetry was sampled at 50 Hz. Digital video and audio were recorded.      BASELINE STUDY WITHOUT CPAP RESULTS:  Lights Out was at 20:49 and CPAP start was at 00:04.  Total recording time (TRT) was 194.5, with a total sleep time (TST) of 175.5 minutes.   The patient's sleep latency was 2 minutes.  REM was absent.  The sleep efficiency was 90.2 %.    SLEEP ARCHITECTURE: Stage N1 8.8%, Stage N2 55.6%, Stage N3 35.6% and Stage R (REM sleep) 0%.   RESPIRATORY ANALYSIS:  There were a total of 116 respiratory events:  38 obstructive apneas, 0 central  apneas and1 mixed apneas with a total of 39 apneas and an apnea index (AI) of 13.3. There were 77 hypopneas with a hypopnea index of 26.3. The patient also had 16 respiratory event related arousals (RERAs).      The total APNEA/HYPOPNEA INDEX (AHI) was 39.7 and the total RESPIRATORY DISTURBANCE INDEX was 45.1.  0 events occurred in REM sleep and 155 events in NREM. The REM AHI was 0, versus a non-REM AHI of 39.7. The patient spent 42% of total sleep time in the supine position. The supine AHI was 66.9 versus a non-supine AHI of 20.0.  OXYGEN SATURATION & C02:  The baseline 02 saturation was 93%, with the lowest being 86% (note: during NREM sleep). Time spent below 89% saturation equaled 35 minutes.   PERIODIC LIMB MOVEMENTS: (Baseline)   The patient had a total of 0 Periodic Limb Movements.  The Periodic Limb Movement (PLM) index was 0 and the PLM Arousal index was 0.   TITRATION STUDY WITH CPAP RESULTS:   CPAP was initiated at 5 cmH20 with heated humidity per AASM split night standards and pressure was advanced to 15 cmH20 because of hypopneas, apneas and desaturations.  At a PAP pressure of 13 cmH20, there was a reduction of the AHI to 1.6 per hour and O2 nadir of 92% with supine REM sleep achieved.   Lights Out was at 00:04 and Lights On at 05:11. Total recording time (TRT) was 308 minutes, with a total sleep time (TST) of 298 minutes. The patient's sleep latency was 3.5 minutes with 5.5 minutes of wake time after  sleep onset. REM latency was 188.5 minutes.  The sleep efficiency was 96.8 %.    SLEEP ARCHITECTURE: Stage N1 5.7%, Stage N2 44.6%, Stage N3 20.1% and Stage R (REM sleep) 29.5%. The sleep architecture was notable for better sleep consolidation and REM rebound.  RESPIRATORY ANALYSIS:  There were a total of 45 respiratory events: 7 obstructive apneas, 1 central apneas and 0 mixed apneas with a total of 8 apneas and an apnea index (AI) of 1.6. There were 37 hypopneas with a hypopnea  index of 7.4. The patient also had 0 respiratory event related arousals (RERAs).      The total APNEA/HYPOPNEA INDEX  (AHI) was 9.1 and the total RESPIRATORY DISTURBANCE INDEX was 9.1.  1 events occurred in REM sleep and 44 events in NREM. The REM AHI was .7, versus a non-REM AHI of 12.6. The patient spent 100% of total sleep time in the supine position. The supine AHI was 9.0, versus a non-supine AHI of 0.0.  OXYGEN SATURATION & C02:  The baseline 02 saturation was 96%, with the lowest being 89%. Time spent below 89% saturation equaled 0 minutes.   PERIODIC LIMB MOVEMENTS: (post-treatment)   The patient had a total of 0 Periodic Limb Movements. The Periodic Limb Movement (PLM) index was 0 and the PLM Arousal index was 0    POLYSOMNOGRAPHY IMPRESSION :   1. Obstructive sleep apnea 2. Dysfunctions associated with sleep stages or arousal from sleep   RECOMMENDATIONS: 1.  This patient has severe obstructive sleep apnea and responded well on CPAP therapy. I will, therefore, start the patient on home CPAP treatment at a pressure of 13 cm via medium FFM, with heated humidity. The patient should be reminded to be fully compliant with PAP therapy to improve sleep related symptoms and decrease long term cardiovascular risks. Please note that untreated obstructive sleep apnea carries additional perioperative morbidity. Patients with significant obstructive sleep apnea should receive perioperative PAP therapy and the surgeons and particularly the anesthesiologist should be informed of the diagnosis and the severity of the sleep disordered breathing. 2.  The patient should be cautioned not to drive, work at heights, or operate dangerous or heavy equipment when tired or sleepy. Review and reiteration of good sleep hygiene measures should be pursued with any patient. 3.  This study shows sleep fragmentation and abnormal sleep stage percentages; these are nonspecific findings and per se do not signify an  intrinsic sleep disorder or a cause for the patient's sleep-related symptoms. Causes include (but are not limited to) the first night effect of the sleep study, circadian rhythm disturbances, medication effect or an underlying mood disorder or medical problem.  4. A follow up appointment will be scheduled in the Sleep Clinic at [facility]. The referring provider will be notified of the test results.    I certify that I have reviewed the entire raw data recording prior to the issuance of this report in accordance with the Standards of Accreditation of the American Academy of Sleep Medicine (AASM)       [name], MD, PhD Diplomat, American Board of Psychiatry and Neurology  Diplomat, [place] of Sleep Medicine

## 2016-02-12 NOTE — Telephone Encounter (Signed)
Glenda [name]:  Patient referred by Dr. Leonie Man and CM, seen by me on 12/23/15, split study on 02/03/16. Please call and notify patient that the recent sleep study confirmed the diagnosis of severe OSA. She did well with CPAP during the study with significant improvement of the respiratory events. Therefore, I would like start the patient on CPAP therapy at home by prescribing a machine for home use. I placed the order in the chart. The patient will need a follow up appointment with me in 8 to 10 weeks post set up that has to be scheduled; please go ahead and schedule while you have the patient on the phone and make sure patient understands the importance of keeping this window for the FU appointment, as it is often an insurance requirement and failing to adhere to this may result in losing coverage for sleep apnea treatment.  Please re-enforce the importance of compliance with treatment and the need for [place] to monitor compliance data - again an insurance requirement and good feedback for the patient as far as how they are doing.  Also remind patient, that any upcoming CPAP machine or mask issues, should be first addressed with the DME company. Please ask if patient has a preference regarding DME company.  Please arrange for CPAP set up at home through a DME company of patient's choice - once you have spoken to the patient - and faxed/routed report to PCP and referring MD (if other than PCP), you can close this encounter, thanks,   [name], MD, PhD [facility] ([place])

## 2016-02-12 NOTE — Telephone Encounter (Signed)
I spoke to patient and she advised me to speak with her daughter. Daughter is aware of results and recommendations. Voices that patient is willing to start treatment at home. Study faxed to PCP. Patient will get a letter reminding her to make f/u appt and stress the importance of compliance.

## 2016-02-13 ENCOUNTER — Ambulatory Visit: Payer: 59 | Admitting: Rehabilitation

## 2016-02-13 ENCOUNTER — Encounter: Payer: Self-pay | Admitting: Rehabilitation

## 2016-02-13 ENCOUNTER — Ambulatory Visit: Payer: 59 | Admitting: Occupational Therapy

## 2016-02-13 DIAGNOSIS — R41842 Visuospatial deficit: Secondary | ICD-10-CM

## 2016-02-13 DIAGNOSIS — R2681 Unsteadiness on feet: Secondary | ICD-10-CM

## 2016-02-13 DIAGNOSIS — R2689 Other abnormalities of gait and mobility: Secondary | ICD-10-CM

## 2016-02-13 DIAGNOSIS — M6281 Muscle weakness (generalized): Secondary | ICD-10-CM | POA: Diagnosis not present

## 2016-02-13 DIAGNOSIS — G8191 Hemiplegia, unspecified affecting right dominant side: Secondary | ICD-10-CM

## 2016-02-13 DIAGNOSIS — R278 Other lack of coordination: Secondary | ICD-10-CM

## 2016-02-13 NOTE — Therapy (Signed)
[facility] [address] [place], [place], [address] Phone: [phone]   Fax:  [phone]  Physical Therapy Treatment  Patient Details  Name: Glenda [name] MRN: [id number] Date of Birth: May 07, 1933 Referring Provider: Donnie Coffin, MD  Encounter Date: 02/13/2016      PT End of Session - 02/13/16 1632    Visit Number 28   Number of Visits 33  updated POC   Date for PT Re-Evaluation 02/29/16   Authorization Type U[employer] MCR- G code needed every 10th visit   PT Start Time 1407  PT late from previous appt   PT Stop Time 1446   PT Time Calculation (min) 39 min   Equipment Utilized During Treatment Gait belt   Activity Tolerance Patient tolerated treatment well;Patient limited by fatigue   Behavior During Therapy [facility] for tasks assessed/performed      Past Medical History:  Diagnosis Date  . Arthritis   . Breast cancer ([place])   . CAD (coronary artery disease)   . Chest pain   . Dizziness   . DM (diabetes mellitus) ([place])   . GERD (gastroesophageal reflux disease)   . HLD (hyperlipidemia)   . HTN (hypertension)   . Obesity   . Radiation 09/11/15-10/13/15   42.72 Gy to right breast, 12 Gy boost to right breast  . Shortness of breath dyspnea   . Stroke ([place])   . UTI (urinary tract infection)     Past Surgical History:  Procedure Laterality Date  . CHOLECYSTECTOMY    . colon polyps; hx    . CORONARY ANGIOPLASTY  02/14/09  . ESOPHAGOGASTRODUODENOSCOPY (EGD) WITH PROPOFOL Left 09/08/2015   Procedure: ESOPHAGOGASTRODUODENOSCOPY (EGD) WITH PROPOFOL;  Surgeon: Clarene Essex, MD;  Location: [facility] ENDOSCOPY;  Service: Endoscopy;  Laterality: Left;  . RADIOACTIVE SEED GUIDED MASTECTOMY WITH AXILLARY SENTINEL LYMPH NODE BIOPSY Right 07/17/2015   Procedure: RADIOACTIVE SEED GUIDED PARTIAL MASTECTOMY WITH AXILLARY SENTINEL LYMPH NODE BIOPSY;  Surgeon: Erroll Luna, MD;  Location: [facility];  Service: General;  Laterality: Right;     There were no vitals filed for this visit.      Subjective Assessment - 02/13/16 1420    Subjective Reports no changes, no falls.     Patient is accompained by: Family member   Limitations Walking;House hold activities   Patient Stated Goals "I want to work on walking"    Currently in Pain? No/denies                         [facility] Adult PT Treatment/Exercise - 02/13/16 1438      Transfers   Transfers Sit to Stand;Stand to Sit   Sit to Stand 4: Min guard;4: Min assist   Sit to Stand Details Verbal cues for sequencing;Verbal cues for technique;Verbal cues for precautions/safety;Verbal cues for safe use of DME/AE;Manual facilitation for weight bearing;Manual facilitation for weight shifting   Stand to Sit 5: Supervision;With upper extremity assist;To bed   Stand to Sit Details (indicate cue type and reason) Verbal cues for precautions/safety;Verbal cues for safe use of DME/AE     Ambulation/Gait   Ambulation/Gait Yes   Ambulation/Gait Assistance 4: Min guard;4: Min assist  for facilitation   Ambulation Distance (Feet) 115 Feet   Assistive device Rolling walker   Gait Pattern Step-through pattern;Decreased stride length;Decreased step length - right;Decreased step length - left;Decreased stance time - right;Decreased hip/knee flexion - right;Decreased weight shift to right;Shuffle;Ataxic;Trunk flexed;Narrow base of support;Poor foot clearance -  right     Neuro Re-ed    Neuro Re-ed Details  Worked on standing balance with focus on forward weight shift over RLE (with forward step) while reaching forward and upward with RUE to targets followed by retrostepping.  Performed x 10 reps during session with min A with increased facilitaiton for forward weight shift.  Transitioned to working in squatted position shifting R and L for improved proximal control, esp in quad.                 PT Education - 02/13/16 1421    Education provided Yes   Education Details  discussed ending therapy following this POC in order to have pt work at home with family to increase independence   Person(s) Educated Patient   Methods Explanation   Comprehension Verbalized understanding          PT Short Term Goals - 12/03/15 1107      PT SHORT TERM GOAL #1   Title Pt will initiate HEP for strengthening and balance in order to improve functional mobility.  (Target Date: 11/28/15)   Baseline 12/03/15: met today after reinstruction with pictures to daughter/pt   Status Achieved     PT SHORT TERM GOAL #2   Title Pt will improve TUG score to <80 seconds in order to indicate decreased fall risk.     Baseline 11/25/15: 52.44 sec's with RW and simulated toe cap    Status Achieved     PT SHORT TERM GOAL #3   Title Will assess BERG and improve score by 4 points in order to indicate decreased fall risk and functional improvement in balance.     Baseline 11/27/15: 17/56 today (was 12/56 at baseline)   Status Achieved     PT SHORT TERM GOAL #4   Title Pt will ambulate over indoor surfaces x 150' w/ RW at S level in order to indicate safe negotiation in home.     Baseline 11/25/15: pt able to ambulate this distance with varied amout of assistance (min guard to min assist)   Status Partially Met     PT SHORT TERM GOAL #5   Title Pt will verbalize fall prevention strategies in order to indicate decreased fall risk inside and outside of home.     Baseline 12/03/15: fall prevention strategies issued today with review with pt and daughter   Status Partially Met     PT SHORT TERM GOAL #6   Title Pt will perform 8/10 sit <>stands with single UE support only at mod I level in order to indicate improved functional strength.     Baseline 12/03/15: pt continues to need min>mod assist to power up and to control descent with sitting down using 1-2 UE's to assist.   Status Not Met           PT Long Term Goals - 01/30/16 1058      PT LONG TERM GOAL #1   Title Pt will be independent with  HEP in order to indicate improved functional mobility and decreased fall risk.  (Modified Target Date: 02/27/16)   Baseline 01/29/16: met per pt and daughter's report - reports doing them "occasionally". both report doing the walking program daily.   Status Achieved     PT LONG TERM GOAL #2   Title Pt will improve TUG score to <30 secs in order to indicate decreased fall risk.     Baseline 01/27/16: 37.2 sec's with RW today   Status Revised  PT LONG TERM GOAL #3   Title Pt will improve BERG balance score to 32/56 in order to indicate decreased fall risk.     Baseline 01/27/16: 28/56 today (was 10/56 on 12/25/15)   Status Revised     PT LONG TERM GOAL #4   Title Pt will perform 8/10 sit<>stand without UE support at mod I level in order to indicate improved functional strength.     Baseline 01/27/16: pt continues to require UE support to stand standard height surfaces and lower   Status On-going     PT LONG TERM GOAL #5   Title Pt will ambulate 500' w/ LRAD over paved outdoor surfaces at mod I level in order to indicate safe return to community.     Baseline 01/27/16: able to ambulate up to 225 ft consecutively over indoor/outdoor paved surfaces at supervision to min guard assist level. 01/29/16: pt met the distance portion with supervision on indoor surfaces on this date   Status Revised     PT LONG TERM GOAL #6   Title Pt will negotiate up/down 5 steps w/ single rail at mod I level in order to indicate safe entry into home/family's homes.     Baseline 01/29/16: pt able to negotiate the stairs with 1 rail at min guard to min assist level   Status On-going               Plan - 02/13/16 1633    Clinical Impression Statement Skilled session focused on RLE NMR with forward stepping/retrostepping and weight shifting activity.  Also engaged RUE for improved postural control during task.  ended with gait with RW to assess carryover from NMR exercise.  continues to require cues for posture  and improved R hip protraction during stance.     Rehab Potential Excellent   PT Frequency 2x / week   PT Duration 4 weeks   PT Treatment/Interventions ADLs/Self Care Home Management;Electrical Stimulation;DME Instruction;Gait training;Stair training;Functional mobility training;Therapeutic activities;Therapeutic exercise;Balance training;Neuromuscular re-education;Patient/family education;Orthotic Fit/Training;Vestibular;Visual/perceptual remediation/compensation   PT Next Visit Plan R hip protraction, dynamic standing with intermittent UE support, gait with RW for improved quality and speed.    Consulted and Agree with Plan of Care Patient;Family member/caregiver   Family Member Consulted daughter,       Patient will benefit from skilled therapeutic intervention in order to improve the following deficits and impairments:  Abnormal gait, Decreased activity tolerance, Decreased balance, Decreased coordination, Decreased endurance, Decreased knowledge of use of DME, Decreased mobility, Decreased strength, Dizziness, Increased edema, Impaired perceived functional ability, Impaired flexibility, Impaired UE functional use, Impaired vision/preception, Improper body mechanics, Postural dysfunction  Visit Diagnosis: Hemiparesis of right dominant side, unspecified hemiparesis etiology (HCC)  Unsteadiness on feet  Muscle weakness (generalized)  Other abnormalities of gait and mobility     Problem List Patient Active Problem List   Diagnosis Date Noted  . Somnolence, daytime 12/08/2015  . Cerebral thrombosis with cerebral infarction 09/07/2015  . Lacunar infarct, acute ([place])   . Nausea & vomiting 09/05/2015  . Acute kidney injury ([place]) 09/04/2015  . Type II diabetes mellitus with neurological manifestations, uncontrolled ([place]) 09/03/2015  . Bilateral lower extremity edema 09/03/2015  . Essential hypertension   . Diabetes mellitus type 2 in nonobese (HCC)   . History of breast cancer   .  AKI (acute kidney injury) ([place])   . Acute blood loss anemia   . Hemiparesis ([place])   . Gait disturbance, post-stroke   . Dysphagia, post-stroke   .  Acute ischemic stroke/right cerebellar 08/24/2015  . Breast cancer of upper-outer quadrant of right female breast ([place]) 04/30/2015  . CVA (cerebral infarction) 07/05/2014  . Hyperlipidemia 07/05/2014  . Syncope 07/04/2014  . Mild mitral regurgitation by prior echocardiogram 12/20/2011  . Diabetes mellitus type 1, uncontrolled, insulin dependent ([place]) 02/26/2009  . OBESITY 02/26/2009  . Uncontrolled hypertension 02/26/2009  . CAD (coronary artery disease) 02/26/2009  . GASTROESOPHAGEAL REFLUX DISEASE 02/26/2009    Cameron Sprang, PT, MPT [facility] [address] [place], [place], [address] Phone: [phone]   Fax:  [phone] 02/13/16, 4:49 PM  Name: Glenda [name] MRN: [id number] Date of Birth: 1933/12/23

## 2016-02-13 NOTE — Therapy (Signed)
[facility] [address] [place], [place], [address] Phone: [phone]   Fax:  [phone]  Occupational Therapy Treatment  Patient Details  Name: Glenda [name] MRN: [id number] Date of Birth: 07/18/1933 Referring Provider: Dr. Donnie Coffin  Encounter Date: 02/13/2016      OT End of Session - 02/13/16 1622    Visit Number 26   Number of Visits 32   Date for OT Re-Evaluation 03/07/16   Authorization Type UHC MCR- short term goals due 02/06/16   Authorization - Visit Number 26   Authorization - Number of Visits 30   OT Start Time 1450   OT Stop Time 1530   OT Time Calculation (min) 40 min   Activity Tolerance Patient tolerated treatment well   Behavior During Therapy [facility] for tasks assessed/performed      Past Medical History:  Diagnosis Date  . Arthritis   . Breast cancer ([place])   . CAD (coronary artery disease)   . Chest pain   . Dizziness   . DM (diabetes mellitus) ([place])   . GERD (gastroesophageal reflux disease)   . HLD (hyperlipidemia)   . HTN (hypertension)   . Obesity   . Radiation 09/11/15-10/13/15   42.72 Gy to right breast, 12 Gy boost to right breast  . Shortness of breath dyspnea   . Stroke ([place])   . UTI (urinary tract infection)     Past Surgical History:  Procedure Laterality Date  . CHOLECYSTECTOMY    . colon polyps; hx    . CORONARY ANGIOPLASTY  02/14/09  . ESOPHAGOGASTRODUODENOSCOPY (EGD) WITH PROPOFOL Left 09/08/2015   Procedure: ESOPHAGOGASTRODUODENOSCOPY (EGD) WITH PROPOFOL;  Surgeon: Clarene Essex, MD;  Location: [facility] ENDOSCOPY;  Service: Endoscopy;  Laterality: Left;  . RADIOACTIVE SEED GUIDED MASTECTOMY WITH AXILLARY SENTINEL LYMPH NODE BIOPSY Right 07/17/2015   Procedure: RADIOACTIVE SEED GUIDED PARTIAL MASTECTOMY WITH AXILLARY SENTINEL LYMPH NODE BIOPSY;  Surgeon: Erroll Luna, MD;  Location: [facility];  Service: General;  Laterality: Right;    There were no vitals filed for this  visit.      Treatment: 10 reps each for bilateral ball exercises seated for chest press, diagonals, and shoulder flexion min facilitation/ v.c. Standing to place/ remove items from low cabinet with RUE, close supervision, no LOB. Placing pieces in concentration board with RUE for increased fine motor coordination and visual perceptual skills, min difficulty                        OT Short Term Goals - 01/22/16 1630      OT SHORT TERM GOAL #1   Title I with inital HEP.   Time 4   Period Weeks   Status Achieved     OT SHORT TERM GOAL #2   Title Pt will demonstrate ability to retrieve a lightweight object at 110* with RUE.   Time 4   Period Weeks   Status On-going  100*     OT SHORT TERM GOAL #3   Title Pt will demonstrate improved fine motor coordination as evidenced by decreasing 9 hole peg test score by 4 secs bilaterally.   Status Partially Met  met for LUE only 36.40, RUE 75.72, 69.09     OT SHORT TERM GOAL #4   Title Pt will perform simple snack prep or light home management task in standing with min A without LOB demonstrating good safety awareness.   Time 4   Period Weeks   Status  Achieved     OT SHORT TERM GOAL #5   Title Pt will demonstrate ability to write a short paragraph with 95% legibility.   Time 4   Period Weeks   Status On-going  not consistent, 80-95% legibility     OT SHORT TERM GOAL #6   Title Further assess cognition and set goal PRN   Time 4   Period Weeks   Status Deferred     OT SHORT TERM GOAL #7   Title Pt will demonstrate ability to navigate a busy environment and locate items with 80% accuracy.   Time 4   Period Weeks   Status On-going     OT SHORT TERM GOAL #8   Title Pt demonstrate improved fine motor coordination as evidenced by perfoming 9 hole peg test in 63 secs or less with RUE    Baseline 75.72, 69.09   Time 4   Period Weeks   Status Achieved  54.37 secs           OT Long Term Goals - 01/08/16  1551      OT LONG TERM GOAL #1   Title I with updated HEP.   Time 8   Period Weeks   Status On-going  previous HEP still appropriate      OT LONG TERM GOAL #2   Title Pt will perform simple cooking task in standing with supervision demonstrating good safety awareness.- Revised goal-Pt will perform simple cooking task in standing with min A.   Time 8   Period Weeks   Status Revised  not fully addressed due to pt's decline since TIA     OT LONG TERM GOAL #3   Title Pt will demonstrate ability to retrieve a light weight object at 120 shoulder flexion with RUE   Time 8   Period Weeks   Status On-going  95-100     OT LONG TERM GOAL #4   Title Pt will resume use of RUE as dominant hand at least 75% of the time for ADLs/IADLs.   Time 8   Period Weeks   Status On-going  50%     OT LONG TERM GOAL #5   Title Pt will decrease RUE 9 hole peg test score to 49 secs from for increased fine motor coordination. - Revised pt will complete 9 hole peg test in 54 secs or less with RUE.   Status Revised  75.72, 69.09     OT LONG TERM GOAL #6   Title Pt will be modified indpendent with all basic ADLs.   Time 8   Period Weeks   Status Achieved  except for supervision with shower transfers               Plan - 02/13/16 1621    Clinical Impression Statement pt is progessing towards goals for RUE strength, coordination and standing balance.   Rehab Potential Good   OT Frequency 2x / week   OT Duration 8 weeks   OT Treatment/Interventions Self-care/ADL training;Therapeutic exercise;Patient/family education;Functional Mobility Training;Neuromuscular education;Manual Therapy;Therapeutic exercises;DME and/or AE instruction;Therapeutic activities;Electrical Stimulation;Fluidtherapy;Cognitive remediation/compensation;Moist Heat;Passive range of motion;Visual/perceptual remediation/compensation   Plan continue to work towards long term goals, anticipate d/c in 2 weeks   Consulted and Agree  with Plan of Care Patient;Family member/caregiver      Patient will benefit from skilled therapeutic intervention in order to improve the following deficits and impairments:  Abnormal gait, Decreased coordination, Decreased range of motion, Difficulty walking, Decreased safety awareness, Decreased endurance, Decreased  activity tolerance, Decreased knowledge of precautions, Impaired UE functional use, Decreased knowledge of use of DME, Decreased balance, Decreased cognition, Decreased mobility, Decreased strength, Impaired perceived functional ability  Visit Diagnosis: Visuospatial deficit  Hemiparesis of right dominant side, unspecified hemiparesis etiology (HCC)  Unsteadiness on feet  Muscle weakness (generalized)  Other lack of coordination    Problem List Patient Active Problem List   Diagnosis Date Noted  . Somnolence, daytime 12/08/2015  . Cerebral thrombosis with cerebral infarction 09/07/2015  . Lacunar infarct, acute ([place])   . Nausea & vomiting 09/05/2015  . Acute kidney injury ([place]) 09/04/2015  . Type II diabetes mellitus with neurological manifestations, uncontrolled ([place]) 09/03/2015  . Bilateral lower extremity edema 09/03/2015  . Essential hypertension   . Diabetes mellitus type 2 in nonobese (HCC)   . History of breast cancer   . AKI (acute kidney injury) ([place])   . Acute blood loss anemia   . Hemiparesis ([place])   . Gait disturbance, post-stroke   . Dysphagia, post-stroke   . Acute ischemic stroke/right cerebellar 08/24/2015  . Breast cancer of upper-outer quadrant of right female breast ([place]) 04/30/2015  . CVA (cerebral infarction) 07/05/2014  . Hyperlipidemia 07/05/2014  . Syncope 07/04/2014  . Mild mitral regurgitation by prior echocardiogram 12/20/2011  . Diabetes mellitus type 1, uncontrolled, insulin dependent ([place]) 02/26/2009  . OBESITY 02/26/2009  . Uncontrolled hypertension 02/26/2009  . CAD (coronary artery disease) 02/26/2009  . GASTROESOPHAGEAL  REFLUX DISEASE 02/26/2009    Aikeem Lilley 02/13/2016, 4:36 PM  [facility] [address] [place], [place], [address] Phone: [phone]   Fax:  [phone]  Name: Glenda [name] MRN: [id number] Date of Birth: 1933/08/14

## 2016-02-17 ENCOUNTER — Encounter: Payer: Medicare Other | Admitting: *Deleted

## 2016-02-17 ENCOUNTER — Ambulatory Visit: Payer: 59 | Admitting: Physical Therapy

## 2016-02-17 ENCOUNTER — Ambulatory Visit: Payer: 59 | Admitting: Occupational Therapy

## 2016-02-17 ENCOUNTER — Encounter: Payer: Self-pay | Admitting: Physical Therapy

## 2016-02-17 DIAGNOSIS — G8191 Hemiplegia, unspecified affecting right dominant side: Secondary | ICD-10-CM

## 2016-02-17 DIAGNOSIS — R2681 Unsteadiness on feet: Secondary | ICD-10-CM

## 2016-02-17 DIAGNOSIS — M6281 Muscle weakness (generalized): Secondary | ICD-10-CM

## 2016-02-17 DIAGNOSIS — R2689 Other abnormalities of gait and mobility: Secondary | ICD-10-CM

## 2016-02-17 DIAGNOSIS — R41842 Visuospatial deficit: Secondary | ICD-10-CM

## 2016-02-17 NOTE — Therapy (Signed)
[facility] [address] [place], [place], [address] Phone: [phone]   Fax:  [phone]  Occupational Therapy Treatment  Patient Details  Name: Glenda [name] MRN: [id number] Date of Birth: 31-Mar-1934 Referring Provider: Dr. Donnie Coffin  Encounter Date: 02/17/2016      OT End of Session - 02/17/16 1625    Visit Number 27   Number of Visits 32   Date for OT Re-Evaluation 03/07/16   Authorization Type UHC MCR- short term goals due 02/06/16   Authorization - Visit Number 35   Authorization - Number of Visits 30   OT Start Time 1448   OT Stop Time 1530   OT Time Calculation (min) 42 min   Activity Tolerance Patient tolerated treatment well   Behavior During Therapy [facility] for tasks assessed/performed      Past Medical History:  Diagnosis Date  . Arthritis   . Breast cancer ([place])   . CAD (coronary artery disease)   . Chest pain   . Dizziness   . DM (diabetes mellitus) ([place])   . GERD (gastroesophageal reflux disease)   . HLD (hyperlipidemia)   . HTN (hypertension)   . Obesity   . Radiation 09/11/15-10/13/15   42.72 Gy to right breast, 12 Gy boost to right breast  . Shortness of breath dyspnea   . Stroke ([place])   . UTI (urinary tract infection)     Past Surgical History:  Procedure Laterality Date  . CHOLECYSTECTOMY    . colon polyps; hx    . CORONARY ANGIOPLASTY  02/14/09  . ESOPHAGOGASTRODUODENOSCOPY (EGD) WITH PROPOFOL Left 09/08/2015   Procedure: ESOPHAGOGASTRODUODENOSCOPY (EGD) WITH PROPOFOL;  Surgeon: Clarene Essex, MD;  Location: [facility] ENDOSCOPY;  Service: Endoscopy;  Laterality: Left;  . RADIOACTIVE SEED GUIDED MASTECTOMY WITH AXILLARY SENTINEL LYMPH NODE BIOPSY Right 07/17/2015   Procedure: RADIOACTIVE SEED GUIDED PARTIAL MASTECTOMY WITH AXILLARY SENTINEL LYMPH NODE BIOPSY;  Surgeon: Erroll Luna, MD;  Location: [facility];  Service: General;  Laterality: Right;    There were no vitals filed for this  visit.      Subjective Assessment - 02/17/16 1448    Pertinent History CVA 08/24/15   Patient Stated Goals To do everything I did before   Currently in Pain? No/denies               Treatment: Pt was able to open and mix blueberry muffins after retrieving milk. Pt performed task partially standing and partially seated. Pt then scooped mix into muffin tins with supervision -min A from therapist. Therapist recommended that pt has someone place items in the oven for her for safety. Therapist requested that pt/ dtr have pt mix up cornbread at home,(with assistance and family should place it in the oven for safety.)                  OT Short Term Goals - 01/22/16 1630      OT SHORT TERM GOAL #1   Title I with inital HEP.   Time 4   Period Weeks   Status Achieved     OT SHORT TERM GOAL #2   Title Pt will demonstrate ability to retrieve a lightweight object at 110* with RUE.   Time 4   Period Weeks   Status On-going  100*     OT SHORT TERM GOAL #3   Title Pt will demonstrate improved fine motor coordination as evidenced by decreasing 9 hole peg test score by 4 secs bilaterally.  Status Partially Met  met for LUE only 36.40, RUE 75.72, 69.09     OT SHORT TERM GOAL #4   Title Pt will perform simple snack prep or light home management task in standing with min A without LOB demonstrating good safety awareness.   Time 4   Period Weeks   Status Achieved     OT SHORT TERM GOAL #5   Title Pt will demonstrate ability to write a short paragraph with 95% legibility.   Time 4   Period Weeks   Status On-going  not consistent, 80-95% legibility     OT SHORT TERM GOAL #6   Title Further assess cognition and set goal PRN   Time 4   Period Weeks   Status Deferred     OT SHORT TERM GOAL #7   Title Pt will demonstrate ability to navigate a busy environment and locate items with 80% accuracy.   Time 4   Period Weeks   Status On-going     OT SHORT TERM GOAL #8    Title Pt demonstrate improved fine motor coordination as evidenced by perfoming 9 hole peg test in 63 secs or less with RUE    Baseline 75.72, 69.09   Time 4   Period Weeks   Status Achieved  54.37 secs           OT Long Term Goals - 01/08/16 1551      OT LONG TERM GOAL #1   Title I with updated HEP.   Time 8   Period Weeks   Status On-going  previous HEP still appropriate      OT LONG TERM GOAL #2   Title Pt will perform simple cooking task in standing with supervision demonstrating good safety awareness.- Revised goal-Pt will perform simple cooking task in standing with min A.   Time 8   Period Weeks   Status Revised  not fully addressed due to pt's decline since TIA     OT LONG TERM GOAL #3   Title Pt will demonstrate ability to retrieve a light weight object at 120 shoulder flexion with RUE   Time 8   Period Weeks   Status On-going  95-100     OT LONG TERM GOAL #4   Title Pt will resume use of RUE as dominant hand at least 75% of the time for ADLs/IADLs.   Time 8   Period Weeks   Status On-going  50%     OT LONG TERM GOAL #5   Title Pt will decrease RUE 9 hole peg test score to 49 secs from for increased fine motor coordination. - Revised pt will complete 9 hole peg test in 54 secs or less with RUE.   Status Revised  75.72, 69.09     OT LONG TERM GOAL #6   Title Pt will be modified indpendent with all basic ADLs.   Time 8   Period Weeks   Status Achieved  except for supervision with shower transfers               Plan - 02/17/16 1623    Clinical Impression Statement Pt is progressing towards goals. She was able to assist with kitchen task with min-mod A from therapist.   Rehab Potential Good   OT Frequency 2x / week   OT Duration 8 weeks   Plan anticipate d/c next week   Consulted and Agree with Plan of Care Patient;Family member/caregiver   Family Member Consulted daughter  Patient will benefit from skilled therapeutic intervention in  order to improve the following deficits and impairments:  Abnormal gait, Decreased coordination, Decreased range of motion, Difficulty walking, Decreased safety awareness, Decreased endurance, Decreased activity tolerance, Decreased knowledge of precautions, Impaired UE functional use, Decreased knowledge of use of DME, Decreased balance, Decreased cognition, Decreased mobility, Decreased strength, Impaired perceived functional ability  Visit Diagnosis: Visuospatial deficit  Hemiparesis of right dominant side, unspecified hemiparesis etiology (HCC)  Unsteadiness on feet  Muscle weakness (generalized)    Problem List Patient Active Problem List   Diagnosis Date Noted  . Somnolence, daytime 12/08/2015  . Cerebral thrombosis with cerebral infarction 09/07/2015  . Lacunar infarct, acute ([place])   . Nausea & vomiting 09/05/2015  . Acute kidney injury ([place]) 09/04/2015  . Type II diabetes mellitus with neurological manifestations, uncontrolled ([place]) 09/03/2015  . Bilateral lower extremity edema 09/03/2015  . Essential hypertension   . Diabetes mellitus type 2 in nonobese (HCC)   . History of breast cancer   . AKI (acute kidney injury) ([place])   . Acute blood loss anemia   . Hemiparesis ([place])   . Gait disturbance, post-stroke   . Dysphagia, post-stroke   . Acute ischemic stroke/right cerebellar 08/24/2015  . Breast cancer of upper-outer quadrant of right female breast ([place]) 04/30/2015  . CVA (cerebral infarction) 07/05/2014  . Hyperlipidemia 07/05/2014  . Syncope 07/04/2014  . Mild mitral regurgitation by prior echocardiogram 12/20/2011  . Diabetes mellitus type 1, uncontrolled, insulin dependent ([place]) 02/26/2009  . OBESITY 02/26/2009  . Uncontrolled hypertension 02/26/2009  . CAD (coronary artery disease) 02/26/2009  . GASTROESOPHAGEAL REFLUX DISEASE 02/26/2009    Zaydon Kinser 02/17/2016, 4:26 PM Theone Murdoch, OTR/L [phone] Phone: [phone] 4:31 PM 02/17/16 [facility] [address] [place], [place], [address] Phone: [phone]   Fax:  [phone]  Name: Sharian Delia MRN: [id number] Date of Birth: 09-24-1933

## 2016-02-18 NOTE — Therapy (Signed)
[facility] [address] [place], [place], [address] Phone: [phone]   Fax:  [phone]  Physical Therapy Treatment  Patient Details  Name: Glenda [name] MRN: [id number] Date of Birth: May 25, 1933 Referring Provider: Donnie Coffin, MD  Encounter Date: 02/17/2016      PT End of Session - 02/17/16 1409    Visit Number 29   Number of Visits 33  updated POC   Date for PT Re-Evaluation 02/29/16   Authorization Type UHC MCR- G code needed every 10th visit   PT Start Time 1402   PT Stop Time 1445   PT Time Calculation (min) 43 min   Equipment Utilized During Treatment Gait belt   Activity Tolerance Patient tolerated treatment well;Patient limited by fatigue   Behavior During Therapy [facility] for tasks assessed/performed      Past Medical History:  Diagnosis Date  . Arthritis   . Breast cancer ([place])   . CAD (coronary artery disease)   . Chest pain   . Dizziness   . DM (diabetes mellitus) ([place])   . GERD (gastroesophageal reflux disease)   . HLD (hyperlipidemia)   . HTN (hypertension)   . Obesity   . Radiation 09/11/15-10/13/15   42.72 Gy to right breast, 12 Gy boost to right breast  . Shortness of breath dyspnea   . Stroke ([place])   . UTI (urinary tract infection)     Past Surgical History:  Procedure Laterality Date  . CHOLECYSTECTOMY    . colon polyps; hx    . CORONARY ANGIOPLASTY  02/14/09  . ESOPHAGOGASTRODUODENOSCOPY (EGD) WITH PROPOFOL Left 09/08/2015   Procedure: ESOPHAGOGASTRODUODENOSCOPY (EGD) WITH PROPOFOL;  Surgeon: Clarene Essex, MD;  Location: [facility] ENDOSCOPY;  Service: Endoscopy;  Laterality: Left;  . RADIOACTIVE SEED GUIDED MASTECTOMY WITH AXILLARY SENTINEL LYMPH NODE BIOPSY Right 07/17/2015   Procedure: RADIOACTIVE SEED GUIDED PARTIAL MASTECTOMY WITH AXILLARY SENTINEL LYMPH NODE BIOPSY;  Surgeon: Erroll Luna, MD;  Location: [facility];  Service: General;  Laterality: Right;    There were [name] vitals  filed for this visit.      Subjective Assessment - 02/17/16 1408    Subjective [name] new complaints. [name] falls or pain to report.    Patient is accompained by: Family member   Limitations Walking;House hold activities   Patient Stated Goals "I want to work on walking"    Currently in Pain? [name]/denies   Pain Score 0-[name] pain            [facility] Adult PT Treatment/Exercise - 02/17/16 1410      Transfers   Transfers Sit to Stand;Stand to Sit   Sit to Stand 5: Supervision;With upper extremity assist;From chair/3-in-1;With armrests   Sit to Stand Details Verbal cues for precautions/safety;Verbal cues for safe use of DME/AE   Stand to Sit 5: Supervision;With upper extremity assist;To chair/3-in-1;With armrests   Stand to Sit Details (indicate cue type and reason) Verbal cues for precautions/safety;Verbal cues for safe use of DME/AE     Ambulation/Gait   Ambulation/Gait Yes   Ambulation/Gait Assistance 4: Min guard;5: Supervision   Ambulation/Gait Assistance Details occasional verbal cues on posture and for increased step/stride length with gait   Ambulation Distance (Feet) 325 Feet  x1 indoor; 205 ft in/outdoors   Assistive device Rolling walker   Gait Pattern Step-through pattern;Decreased stride length;Decreased step length - right;Decreased step length - left;Decreased stance time - right;Decreased hip/knee flexion - right;Decreased weight shift to right;Shuffle;Ataxic;Trunk flexed;Narrow base of support;Poor foot clearance - right  Ambulation Surface Level;Indoor;Unlevel;Outdoor;Paved   Stairs Yes   Stairs Assistance 4: Min guard   Stairs Assistance Details (indicate cue type and reason) min cues needed on posture and sequencing/technique   Stair Management Technique One rail Left;Step to pattern;Sideways   Number of Stairs 4   Height of Stairs 6   Curb 4: Min assist;Other (comment)  min guard assist   Curb Details (indicate cue type and reason) x 3 reps on outdoor curbs, cues on  proper sequencing with RW/LE's.              PT Short Term Goals - 12/03/15 1107      PT SHORT TERM GOAL #1   Title Pt will initiate HEP for strengthening and balance in order to improve functional mobility.  (Target Date: 11/28/15)   Baseline 12/03/15: met today after reinstruction with pictures to daughter/pt   Status Achieved     PT SHORT TERM GOAL #2   Title Pt will improve TUG score to <80 seconds in order to indicate decreased fall risk.     Baseline 11/25/15: 52.44 sec's with RW and simulated toe cap    Status Achieved     PT SHORT TERM GOAL #3   Title Will assess BERG and improve score by 4 points in order to indicate decreased fall risk and functional improvement in balance.     Baseline 11/27/15: 17/56 today (was 12/56 at baseline)   Status Achieved     PT SHORT TERM GOAL #4   Title Pt will ambulate over indoor surfaces x 150' w/ RW at S level in order to indicate safe negotiation in home.     Baseline 11/25/15: pt able to ambulate this distance with varied amout of assistance (min guard to min assist)   Status Partially Met     PT SHORT TERM GOAL #5   Title Pt will verbalize fall prevention strategies in order to indicate decreased fall risk inside and outside of home.     Baseline 12/03/15: fall prevention strategies issued today with review with pt and daughter   Status Partially Met     PT SHORT TERM GOAL #6   Title Pt will perform 8/10 sit <>stands with single UE support only at mod I level in order to indicate improved functional strength.     Baseline 12/03/15: pt continues to need min>mod assist to power up and to control descent with sitting down using 1-2 UE's to assist.   Status Not Met           PT Long Term Goals - 01/30/16 1058      PT LONG TERM GOAL #1   Title Pt will be independent with HEP in order to indicate improved functional mobility and decreased fall risk.  (Modified Target Date: 02/27/16)   Baseline 01/29/16: met per pt and daughter's report -  reports doing them "occasionally". both report doing the walking program daily.   Status Achieved     PT LONG TERM GOAL #2   Title Pt will improve TUG score to <30 secs in order to indicate decreased fall risk.     Baseline 01/27/16: 37.2 sec's with RW today   Status Revised     PT LONG TERM GOAL #3   Title Pt will improve BERG balance score to 32/56 in order to indicate decreased fall risk.     Baseline 01/27/16: 28/56 today (was 10/56 on 12/25/15)   Status Revised     PT LONG TERM GOAL #4  Title Pt will perform 8/10 sit<>stand without UE support at mod I level in order to indicate improved functional strength.     Baseline 01/27/16: pt continues to require UE support to stand standard height surfaces and lower   Status On-going     PT LONG TERM GOAL #5   Title Pt will ambulate 500' w/ LRAD over paved outdoor surfaces at mod I level in order to indicate safe return to community.     Baseline 01/27/16: able to ambulate up to 225 ft consecutively over indoor/outdoor paved surfaces at supervision to min guard assist level. 01/29/16: pt met the distance portion with supervision on indoor surfaces on this date   Status Revised     PT LONG TERM GOAL #6   Title Pt will negotiate up/down 5 steps w/ single rail at mod I level in order to indicate safe entry into home/family's homes.     Baseline 01/29/16: pt able to negotiate the stairs with 1 rail at min guard to min assist level   Status On-going            Plan - 02/17/16 1409    Clinical Impression Statement today's skilled session continued to address activity tolerance and gait/barriers with RW/rail on stairs. Pt occasionally needed cues on posture and step length. Pt does tend to run into objects/walls/etc on her right side, needing cues at times to negotiate around these for safety. Pt should benefit from continued PT to progress toward unmet goals .   Rehab Potential Excellent   PT Frequency 2x / week   PT Duration 4 weeks   PT  Treatment/Interventions ADLs/Self Care Home Management;Electrical Stimulation;DME Instruction;Gait training;Stair training;Functional mobility training;Therapeutic activities;Therapeutic exercise;Balance training;Neuromuscular re-education;Patient/family education;Orthotic Fit/Training;Vestibular;Visual/perceptual remediation/compensation   PT Next Visit Plan R hip protraction, dynamic standing with intermittent UE support, gait with RW for improved quality and speed.    Consulted and Agree with Plan of Care Patient;Family member/caregiver   Family Member Consulted daughter,       Patient will benefit from skilled therapeutic intervention in order to improve the following deficits and impairments:  Abnormal gait, Decreased activity tolerance, Decreased balance, Decreased coordination, Decreased endurance, Decreased knowledge of use of DME, Decreased mobility, Decreased strength, Dizziness, Increased edema, Impaired perceived functional ability, Impaired flexibility, Impaired UE functional use, Impaired vision/preception, Improper body mechanics, Postural dysfunction  Visit Diagnosis: Unsteadiness on feet  Muscle weakness (generalized)  Other abnormalities of gait and mobility  Hemiparesis of right dominant side, unspecified hemiparesis etiology ([place])     Problem List Patient Active Problem List   Diagnosis Date Noted  . Somnolence, daytime 12/08/2015  . Cerebral thrombosis with cerebral infarction 09/07/2015  . Lacunar infarct, acute ([place])   . Nausea & vomiting 09/05/2015  . Acute kidney injury ([place]) 09/04/2015  . Type II diabetes mellitus with neurological manifestations, uncontrolled ([place]) 09/03/2015  . Bilateral lower extremity edema 09/03/2015  . Essential hypertension   . Diabetes mellitus type 2 in nonobese (HCC)   . History of breast cancer   . AKI (acute kidney injury) ([place])   . Acute blood loss anemia   . Hemiparesis ([place])   . Gait disturbance, post-stroke   .  Dysphagia, post-stroke   . Acute ischemic stroke/right cerebellar 08/24/2015  . Breast cancer of upper-outer quadrant of right female breast ([place]) 04/30/2015  . CVA (cerebral infarction) 07/05/2014  . Hyperlipidemia 07/05/2014  . Syncope 07/04/2014  . Mild mitral regurgitation by prior echocardiogram 12/20/2011  . Diabetes mellitus type 1, uncontrolled,  insulin dependent ([place]) 02/26/2009  . OBESITY 02/26/2009  . Uncontrolled hypertension 02/26/2009  . CAD (coronary artery disease) 02/26/2009  . GASTROESOPHAGEAL REFLUX DISEASE 02/26/2009    Willow Ora, PTA, [facility] [address], [place], [place] [address] [phone] 02/18/16, 1:58 PM   Name: Glenda [name] MRN: [id number] Date of Birth: 05-27-1933

## 2016-02-19 ENCOUNTER — Encounter: Payer: Self-pay | Admitting: Rehabilitation

## 2016-02-19 ENCOUNTER — Ambulatory Visit (INDEPENDENT_AMBULATORY_CARE_PROVIDER_SITE_OTHER): Payer: Medicare Other | Admitting: Ophthalmology

## 2016-02-19 ENCOUNTER — Encounter: Payer: Medicare Other | Admitting: Speech Pathology

## 2016-02-19 ENCOUNTER — Ambulatory Visit: Payer: 59 | Admitting: Occupational Therapy

## 2016-02-19 ENCOUNTER — Ambulatory Visit: Payer: 59 | Admitting: Rehabilitation

## 2016-02-19 DIAGNOSIS — G8191 Hemiplegia, unspecified affecting right dominant side: Secondary | ICD-10-CM

## 2016-02-19 DIAGNOSIS — M6281 Muscle weakness (generalized): Secondary | ICD-10-CM

## 2016-02-19 DIAGNOSIS — R2689 Other abnormalities of gait and mobility: Secondary | ICD-10-CM

## 2016-02-19 DIAGNOSIS — R2681 Unsteadiness on feet: Secondary | ICD-10-CM

## 2016-02-19 DIAGNOSIS — R278 Other lack of coordination: Secondary | ICD-10-CM

## 2016-02-19 NOTE — Therapy (Signed)
[facility] [address] [place], [place], [address] Phone: [phone]   Fax:  [phone]  Occupational Therapy Treatment  Patient Details  Name: Glenda [name] MRN: [id number] Date of Birth: 1933/05/14 Referring Provider: Dr. Donnie Coffin  Encounter Date: 02/19/2016      OT End of Session - 02/19/16 1554    Visit Number 28   Number of Visits 32   Date for OT Re-Evaluation 03/07/16   Authorization Type UHC MCR- short term goals due 02/06/16   Authorization - Visit Number 28   Authorization - Number of Visits 30   OT Start Time 1450   OT Stop Time 1530   OT Time Calculation (min) 40 min   Activity Tolerance Patient tolerated treatment well   Behavior During Therapy [facility] for tasks assessed/performed      Past Medical History:  Diagnosis Date  . Arthritis   . Breast cancer ([place])   . CAD (coronary artery disease)   . Chest pain   . Dizziness   . DM (diabetes mellitus) ([place])   . GERD (gastroesophageal reflux disease)   . HLD (hyperlipidemia)   . HTN (hypertension)   . Obesity   . Radiation 09/11/15-10/13/15   42.72 Gy to right breast, 12 Gy boost to right breast  . Shortness of breath dyspnea   . Stroke ([place])   . UTI (urinary tract infection)     Past Surgical History:  Procedure Laterality Date  . CHOLECYSTECTOMY    . colon polyps; hx    . CORONARY ANGIOPLASTY  02/14/09  . ESOPHAGOGASTRODUODENOSCOPY (EGD) WITH PROPOFOL Left 09/08/2015   Procedure: ESOPHAGOGASTRODUODENOSCOPY (EGD) WITH PROPOFOL;  Surgeon: Clarene Essex, MD;  Location: [facility] ENDOSCOPY;  Service: Endoscopy;  Laterality: Left;  . RADIOACTIVE SEED GUIDED MASTECTOMY WITH AXILLARY SENTINEL LYMPH NODE BIOPSY Right 07/17/2015   Procedure: RADIOACTIVE SEED GUIDED PARTIAL MASTECTOMY WITH AXILLARY SENTINEL LYMPH NODE BIOPSY;  Surgeon: Erroll Luna, MD;  Location: [facility] [place];  Service: General;  Laterality: Right;    There were no vitals filed for this  visit.      Subjective Assessment - 02/19/16 1600    Subjective  Pt reports mild soreness in right arm   Pertinent History CVA 08/24/15   Currently in Pain? Yes   Pain Score 3    Pain Location Arm   Pain Orientation Right   Pain Descriptors / Indicators Aching   Pain Type Acute pain   Pain Frequency Intermittent   Aggravating Factors  malpositioning   Pain Relieving Factors rest             Treatment: purdue pegboard  for right hand fine motor coordination and in hand manipulation, min-mod difficulty, min v.c. Environmental scanning, 9/13 items located on first pass, pt required mod v.c  for navigating the hallway. Pt kept walking too close to the wall on the left side and required v.c. Seated shoulder flexion and chest press with ball, min v.c./ facilitation                   OT Short Term Goals - 01/22/16 1630      OT SHORT TERM GOAL #1   Title I with inital HEP.   Time 4   Period Weeks   Status Achieved     OT SHORT TERM GOAL #2   Title Pt will demonstrate ability to retrieve a lightweight object at 110* with RUE.   Time 4   Period Weeks   Status On-going  100*     OT SHORT TERM GOAL #3   Title Pt will demonstrate improved fine motor coordination as evidenced by decreasing 9 hole peg test score by 4 secs bilaterally.   Status Partially Met  met for LUE only 36.40, RUE 75.72, 69.09     OT SHORT TERM GOAL #4   Title Pt will perform simple snack prep or light home management task in standing with min A without LOB demonstrating good safety awareness.   Time 4   Period Weeks   Status Achieved     OT SHORT TERM GOAL #5   Title Pt will demonstrate ability to write a short paragraph with 95% legibility.   Time 4   Period Weeks   Status On-going  not consistent, 80-95% legibility     OT SHORT TERM GOAL #6   Title Further assess cognition and set goal PRN   Time 4   Period Weeks   Status Deferred     OT SHORT TERM GOAL #7   Title Pt will  demonstrate ability to navigate a busy environment and locate items with 80% accuracy.   Time 4   Period Weeks   Status On-going     OT SHORT TERM GOAL #8   Title Pt demonstrate improved fine motor coordination as evidenced by perfoming 9 hole peg test in 63 secs or less with RUE    Baseline 75.72, 69.09   Time 4   Period Weeks   Status Achieved  54.37 secs           OT Long Term Goals - 02/19/16 1455      OT LONG TERM GOAL #1   Title I with updated HEP.   Time 8   Status Achieved     OT LONG TERM GOAL #2   Title Pt will perform simple cooking task in standing with supervision demonstrating good safety awareness.- Revised goal-Pt will perform simple cooking task in standing with min A.   Time 8   Period Weeks   Status Partially Met     OT LONG TERM GOAL #3   Title Pt will demonstrate ability to retrieve a light weight object at 120 shoulder flexion with RUE   Time 8   Period Weeks   Status On-going     OT LONG TERM GOAL #4   Title Pt will resume use of RUE as dominant hand at least 75% of the time for ADLs/IADLs.   Time 8   Period Weeks   Status On-going               Plan - 02/19/16 1604    Clinical Impression Statement Pt is progressing towards goals for RUE functional use. Pt continues to demonstrate visual perceptual deficits.   Rehab Potential Good   OT Frequency 2x / week   OT Duration 8 weeks   OT Treatment/Interventions Self-care/ADL training;Therapeutic exercise;Patient/family education;Functional Mobility Training;Neuromuscular education;Manual Therapy;Therapeutic exercises;DME and/or AE instruction;Therapeutic activities;Electrical Stimulation;Fluidtherapy;Cognitive remediation/compensation;Moist Heat;Passive range of motion;Visual/perceptual remediation/compensation   Plan anticipate d/c next week   Consulted and Agree with Plan of Care Patient      Patient will benefit from skilled therapeutic intervention in order to improve the following  deficits and impairments:  Abnormal gait, Decreased coordination, Decreased range of motion, Difficulty walking, Decreased safety awareness, Decreased endurance, Decreased activity tolerance, Decreased knowledge of precautions, Impaired UE functional use, Decreased knowledge of use of DME, Decreased balance, Decreased cognition, Decreased mobility, Decreased strength, Impaired perceived functional ability  Visit Diagnosis: Hemiparesis of right dominant side, unspecified hemiparesis etiology ([place])  Muscle weakness (generalized)  Other lack of coordination    Problem List Patient Active Problem List   Diagnosis Date Noted  . Somnolence, daytime 12/08/2015  . Cerebral thrombosis with cerebral infarction 09/07/2015  . Lacunar infarct, acute ([place])   . Nausea & vomiting 09/05/2015  . Acute kidney injury ([place]) 09/04/2015  . Type II diabetes mellitus with neurological manifestations, uncontrolled ([place]) 09/03/2015  . Bilateral lower extremity edema 09/03/2015  . Essential hypertension   . Diabetes mellitus type 2 in nonobese (HCC)   . History of breast cancer   . AKI (acute kidney injury) ([place])   . Acute blood loss anemia   . Hemiparesis ([place])   . Gait disturbance, post-stroke   . Dysphagia, post-stroke   . Acute ischemic stroke/right cerebellar 08/24/2015  . Breast cancer of upper-outer quadrant of right female breast ([place]) 04/30/2015  . CVA (cerebral infarction) 07/05/2014  . Hyperlipidemia 07/05/2014  . Syncope 07/04/2014  . Mild mitral regurgitation by prior echocardiogram 12/20/2011  . Diabetes mellitus type 1, uncontrolled, insulin dependent ([place]) 02/26/2009  . OBESITY 02/26/2009  . Uncontrolled hypertension 02/26/2009  . CAD (coronary artery disease) 02/26/2009  . GASTROESOPHAGEAL REFLUX DISEASE 02/26/2009    RINE,KATHRYN 02/19/2016, 4:13 PM Theone Murdoch, OTR/L [phone] Phone: [phone] 4:20 PM 02/19/16 [facility] [address] [place], [place], [address] Phone: [phone]   Fax:  [phone]  Name: Shahidah Nesbitt MRN: [id number] Date of Birth: [birth year]

## 2016-02-19 NOTE — Therapy (Signed)
[facility] [address] [place], [place], [address] Phone: [phone]   Fax:  [phone]  Physical Therapy Treatment  Patient Details  Name: Glenda [name] MRN: [id number] Date of Birth: Nov 29, 1933 Referring Provider: Donnie Coffin, MD  Encounter Date: 02/19/2016      PT End of Session - 02/19/16 1933    Visit Number 30   Number of Visits 33  updated POC   Date for PT Re-Evaluation 02/29/16   Authorization Type UHC MCR- G code needed every 10th visit   PT Start Time 1402   PT Stop Time 1445   PT Time Calculation (min) 43 min   Equipment Utilized During Treatment Gait belt   Activity Tolerance Patient tolerated treatment well;Patient limited by fatigue   Behavior During Therapy [facility] for tasks assessed/performed      Past Medical History:  Diagnosis Date  . Arthritis   . Breast cancer ([place])   . CAD (coronary artery disease)   . Chest pain   . Dizziness   . DM (diabetes mellitus) ([place])   . GERD (gastroesophageal reflux disease)   . HLD (hyperlipidemia)   . HTN (hypertension)   . Obesity   . Radiation 09/11/15-10/13/15   42.72 Gy to right breast, 12 Gy boost to right breast  . Shortness of breath dyspnea   . Stroke ([place])   . UTI (urinary tract infection)     Past Surgical History:  Procedure Laterality Date  . CHOLECYSTECTOMY    . colon polyps; hx    . CORONARY ANGIOPLASTY  02/14/09  . ESOPHAGOGASTRODUODENOSCOPY (EGD) WITH PROPOFOL Left 09/08/2015   Procedure: ESOPHAGOGASTRODUODENOSCOPY (EGD) WITH PROPOFOL;  Surgeon: Clarene Essex, MD;  Location[facility] END[facility]COPY;  Service: Endoscopy;  Laterality: Left;  . RADIOACTIVE SEED GUIDED MASTECTOMY WITH AXILLARY SENTINEL LYMPH NODE BIOPSY Right 07/17/2015   Procedure: RADIOACTIVE SEED GUIDED PARTIAL MASTECTOMY WITH AXILLARY SENTINEL LYMPH NODE BIOPSY;  Surgeon: Erroll Luna, MD;  Location: [facility];  Service: General;  Laterality: Right;    There were no vitals  filed for this visit.      Subjective Assessment - 02/19/16 1409    Subjective No new complaints, no fall or pain to report.    Patient is accompained by: Family member   Limitations Walking;House hold activities   Patient Stated Goals "I want to work on walking"    Currently in Pain? No/denies                         [facility] Adult PT Treatment/Exercise - 02/19/16 1430      Transfers   Transfers Sit to Stand;Stand to Sit   Sit to Stand 4: Min assist;3: Mod assist   Sit to Stand Details Verbal cues for precautions/safety;Verbal cues for safe use of DME/AE;Manual facilitation for weight shifting;Manual facilitation for weight bearing   Stand to Sit 4: Min assist;3: Mod assist   Stand to Sit Details (indicate cue type and reason) Verbal cues for precautions/safety;Verbal cues for safe use of DME/AE;Manual facilitation for weight bearing;Manual facilitation for weight shifting   Comments Blocked practice of sit<>stand during session using "dive" technique in which pt reaches forward to increase forward weight shift.  Initially performed with chair placed in front of pt for sense of stability and safety.  Pt able to perform 1-2 with adequate forward weight shift, however more often than not, requires up to mod A to facilitate forward weight shift and elevate into standing.  Also requires  assist for forward trunk flexion when sitting as well.  Progressed during session to having pt reach toward target and eventually reaching and grasping object to increase forward weight shift.   Performed x 15 reps during session with multiple rest breaks needed.       Self-Care   Self-Care Other Self-Care Comments   Other Self-Care Comments  Discussed her increasing walking at home and beginning to walk in community.  Encouraged her/family to complete at trip to [facility] (when getting prescription) and have pt ambulate into/out of store.  Feel that pt needs to begin more ambulation in community  setting to increase endurance.  Both pt and daughter verbalized understanding.       Neuro Re-ed    Neuro Re-ed Details  Attempted forward stepping task for forward weight shift and lateral weight shift onto RLE, however when attmepted, note increased fatigue in RLE and requires assist to prevent buckle.                  PT Education - 02/19/16 1931    Education provided Yes   Education Details Discussed beginning community level ambulation   Person(s) Educated Patient;Child(ren)   Methods Explanation   Comprehension Verbalized understanding          PT Short Term Goals - 12/03/15 1107      PT SHORT TERM GOAL #1   Title Pt will initiate HEP for strengthening and balance in order to improve functional mobility.  (Target Date: 11/28/15)   Baseline 12/03/15: met today after reinstruction with pictures to daughter/pt   Status Achieved     PT SHORT TERM GOAL #2   Title Pt will improve TUG score to <80 seconds in order to indicate decreased fall risk.     Baseline 11/25/15: 52.44 sec's with RW and simulated toe cap    Status Achieved     PT SHORT TERM GOAL #3   Title Will assess BERG and improve score by 4 points in order to indicate decreased fall risk and functional improvement in balance.     Baseline 11/27/15: 17/56 today (was 12/56 at baseline)   Status Achieved     PT SHORT TERM GOAL #4   Title Pt will ambulate over indoor surfaces x 150' w/ RW at S level in order to indicate safe negotiation in home.     Baseline 11/25/15: pt able to ambulate this distance with varied amout of assistance (min guard to min assist)   Status Partially Met     PT SHORT TERM GOAL #5   Title Pt will verbalize fall prevention strategies in order to indicate decreased fall risk inside and outside of home.     Baseline 12/03/15: fall prevention strategies issued today with review with pt and daughter   Status Partially Met     PT SHORT TERM GOAL #6   Title Pt will perform 8/10 sit <>stands with  single UE support only at mod I level in order to indicate improved functional strength.     Baseline 12/03/15: pt continues to need min>mod assist to power up and to control descent with sitting down using 1-2 UE's to assist.   Status Not Met           PT Long Term Goals - 01/30/16 1058      PT LONG TERM GOAL #1   Title Pt will be independent with HEP in order to indicate improved functional mobility and decreased fall risk.  (Modified Target Date: 02/27/16)  Baseline 01/29/16: met per pt and daughter's report - reports doing them "occasionally". both report doing the walking program daily.   Status Achieved     PT LONG TERM GOAL #2   Title Pt will improve TUG score to <30 secs in order to indicate decreased fall risk.     Baseline 01/27/16: 37.2 sec's with RW today   Status Revised     PT LONG TERM GOAL #3   Title Pt will improve BERG balance score to 32/56 in order to indicate decreased fall risk.     Baseline 01/27/16: 28/56 today (was 10/56 on 12/25/15)   Status Revised     PT LONG TERM GOAL #4   Title Pt will perform 8/10 sit<>stand without UE support at mod I level in order to indicate improved functional strength.     Baseline 01/27/16: pt continues to require UE support to stand standard height surfaces and lower   Status On-going     PT LONG TERM GOAL #5   Title Pt will ambulate 500' w/ LRAD over paved outdoor surfaces at mod I level in order to indicate safe return to community.     Baseline 01/27/16: able to ambulate up to 225 ft consecutively over indoor/outdoor paved surfaces at supervision to min guard assist level. 01/29/16: pt met the distance portion with supervision on indoor surfaces on this date   Status Revised     PT LONG TERM GOAL #6   Title Pt will negotiate up/down 5 steps w/ single rail at mod I level in order to indicate safe entry into home/family's homes.     Baseline 01/29/16: pt able to negotiate the stairs with 1 rail at min guard to min assist level    Status On-going               Plan - 13-Mar-2016 1933    Clinical Impression Statement Skilled session focused on blocked practice of sit<>stand with emphsis on forward weight shift both when sitting and standing.  Also discussed increasing community ambulation.     Rehab Potential Excellent   PT Frequency 2x / week   PT Duration 4 weeks   PT Treatment/Interventions ADLs/Self Care Home Management;Electrical Stimulation;DME Instruction;Gait training;Stair training;Functional mobility training;Therapeutic activities;Therapeutic exercise;Balance training;Neuromuscular re-education;Patient/family education;Orthotic Fit/Training;Vestibular;Visual/perceptual remediation/compensation   PT Next Visit Plan R hip protraction, dynamic standing with intermittent UE support, gait with RW for improved quality and speed.    Consulted and Agree with Plan of Care Patient;Family member/caregiver   Family Member Consulted daughter,       Patient will benefit from skilled therapeutic intervention in order to improve the following deficits and impairments:  Abnormal gait, Decreased activity tolerance, Decreased balance, Decreased coordination, Decreased endurance, Decreased knowledge of use of DME, Decreased mobility, Decreased strength, Dizziness, Increased edema, Impaired perceived functional ability, Impaired flexibility, Impaired UE functional use, Impaired vision/preception, Improper body mechanics, Postural dysfunction  Visit Diagnosis: Unsteadiness on feet  Muscle weakness (generalized)  Other abnormalities of gait and mobility  Hemiparesis of right dominant side, unspecified hemiparesis etiology ([place])       G-Codes - 03-13-2016 1935    Functional Assessment Tool Used TUG 37.2 secs (on 01/27/16), S for gait up to 300' w/ RW and 28/56 on BERG   Functional Limitation Mobility: Walking and moving around   Mobility: Walking and Moving Around Current Status [id number]) At least 40 percent but less than 60  percent impaired, limited or restricted   Mobility: Walking and Moving Around Goal Status [phone])  At least 1 percent but less than 20 percent impaired, limited or restricted     Physical Therapy Progress Note  Dates of Reporting Period: 01/07/16 to 02/19/16  Objective Reports of Subjective Statement: See above  Objective Measurements: 28/56 BERG, 37.2 TUG  Goal Update: See LTGs above  Plan: Continue POC  Reason Skilled Services are Required: Continues to have decreased balance, perceptual deficits, decreased attention to R, and R hemiplegia.       Problem List Patient Active Problem List   Diagnosis Date Noted  . Somnolence, daytime 12/08/2015  . Cerebral thrombosis with cerebral infarction 09/07/2015  . Lacunar infarct, acute ([place])   . Nausea & vomiting 09/05/2015  . Acute kidney injury ([place]) 09/04/2015  . Type II diabetes mellitus with neurological manifestations, uncontrolled ([place]) 09/03/2015  . Bilateral lower extremity edema 09/03/2015  . Essential hypertension   . Diabetes mellitus type 2 in nonobese (HCC)   . History of breast cancer   . AKI (acute kidney injury) ([place])   . Acute blood loss anemia   . Hemiparesis ([place])   . Gait disturbance, post-stroke   . Dysphagia, post-stroke   . Acute ischemic stroke/right cerebellar 08/24/2015  . Breast cancer of upper-outer quadrant of right female breast ([place]) 04/30/2015  . CVA (cerebral infarction) 07/05/2014  . Hyperlipidemia 07/05/2014  . Syncope 07/04/2014  . Mild mitral regurgitation by prior echocardiogram 12/20/2011  . Diabetes mellitus type 1, uncontrolled, insulin dependent ([place]) 02/26/2009  . OBESITY 02/26/2009  . Uncontrolled hypertension 02/26/2009  . CAD (coronary artery disease) 02/26/2009  . GASTROESOPHAGEAL REFLUX DISEASE 02/26/2009    Cameron Sprang, PT, MPT [facility] [address] [place], [place], [address] Phone: [phone]   Fax:   [phone] 02/19/16, 7:40 PM  Name: Charish Schroepfer MRN: [id number] Date of Birth: 1933/07/24

## 2016-02-24 ENCOUNTER — Ambulatory Visit: Payer: 59 | Admitting: Occupational Therapy

## 2016-02-24 ENCOUNTER — Encounter: Payer: Self-pay | Admitting: Physical Therapy

## 2016-02-24 ENCOUNTER — Ambulatory Visit: Payer: 59 | Admitting: Physical Therapy

## 2016-02-24 DIAGNOSIS — G8191 Hemiplegia, unspecified affecting right dominant side: Secondary | ICD-10-CM

## 2016-02-24 DIAGNOSIS — R2681 Unsteadiness on feet: Secondary | ICD-10-CM

## 2016-02-24 DIAGNOSIS — R2689 Other abnormalities of gait and mobility: Secondary | ICD-10-CM

## 2016-02-24 DIAGNOSIS — R278 Other lack of coordination: Secondary | ICD-10-CM

## 2016-02-24 DIAGNOSIS — M6281 Muscle weakness (generalized): Secondary | ICD-10-CM

## 2016-02-24 NOTE — Therapy (Signed)
[facility] [address] [place], [place], [address] Phone: [phone]   Fax:  [phone]  Occupational Therapy Treatment  Patient Details  Name: Glenda [name] MRN: [id number] Date of Birth: [birth year] Referring Provider: Dr. Donnie Coffin  Encounter Date: 02/24/2016      OT End of Session - 02/24/16 1434    Visit Number 29   Number of Visits 32   Date for OT Re-Evaluation 03/07/16   Authorization Type U[employer] MCR- short term goals due 02/06/16   Authorization Time Period G code next visit!   Authorization - Visit Number 29   Authorization - Number of Visits 30   OT Start Time 0488   OT Stop Time 1445   OT Time Calculation (min) 40 min   Activity Tolerance Patient tolerated treatment well   Behavior During Therapy WFL for tasks assessed/performed      Past Medical History:  Diagnosis Date  . Arthritis   . Breast cancer ([place])   . CAD (coronary artery disease)   . Chest pain   . Dizziness   . DM (diabetes mellitus) ([place])   . GERD (gastroesophageal reflux disease)   . HLD (hyperlipidemia)   . HTN (hypertension)   . Obesity   . Radiation 09/11/15-10/13/15   42.72 Gy to right breast, 12 Gy boost to right breast  . Shortness of breath dyspnea   . Stroke ([place])   . UTI (urinary tract infection)     Past Surgical History:  Procedure Laterality Date  . CHOLECYSTECTOMY    . colon polyps; hx    . CORONARY ANGIOPLASTY  02/14/09  . ESOPHAGOGASTRODUODENOSCOPY (EGD) WITH PROPOFOL Left 09/08/2015   Procedure: ESOPHAGOGASTRODUODENOSCOPY (EGD) WITH PROPOFOL;  Surgeon: Clarene Essex, MD;  Location: [facility];  Service: Endoscopy;  Laterality: Left;  . RADIOACTIVE SEED GUIDED MASTECTOMY WITH AXILLARY SENTINEL LYMPH NODE BIOPSY Right 07/17/2015   Procedure: RADIOACTIVE SEED GUIDED PARTIAL MASTECTOMY WITH AXILLARY SENTINEL LYMPH NODE BIOPSY;  Surgeon: Erroll Luna, MD;  Location: [facility];  Service: General;  Laterality:  Right;    There were no vitals filed for this visit.      Subjective Assessment - 02/24/16 1436    Pertinent History CVA 08/24/15   Patient Stated Goals To do everything I did before   Currently in Pain? No/denies             Treatment: Therapist started checking progress towards goals in anticipation of d/c next visit Seated and standing to perform mid-high range reaching with RUE to place graded clothespins on vertical antennae, min v.c. To avoid compensation. Environmental scanning in a busy environment with 85% accuracy                   OT Short Term Goals - 02/24/16 1545      OT SHORT TERM GOAL #1   Title I with inital HEP.   Time 4   Period Weeks   Status Achieved     OT SHORT TERM GOAL #2   Title Pt will demonstrate ability to retrieve a lightweight object at 110* with RUE.   Time 4   Period Weeks   Status On-going  100*     OT SHORT TERM GOAL #3   Title Pt will demonstrate improved fine motor coordination as evidenced by decreasing 9 hole peg test score by 4 secs bilaterally.   Status Partially Met  met for LUE only 36.40, RUE 75.72, 69.09     OT SHORT TERM  GOAL #4   Title Pt will perform simple snack prep or light home management task in standing with min A without LOB demonstrating good safety awareness.   Time 4   Period Weeks   Status Achieved     OT SHORT TERM GOAL #5   Title Pt will demonstrate ability to write a short paragraph with 95% legibility.   Time 4   Period Weeks   Status On-going  not consistent, 80-95% legibility     OT SHORT TERM GOAL #6   Title Further assess cognition and set goal PRN   Time 4   Period Weeks   Status Deferred     OT SHORT TERM GOAL #7   Title Pt will demonstrate ability to navigate a busy environment and locate items with 80% accuracy.   Time 4   Period Weeks   Status Achieved  85%     OT SHORT TERM GOAL #8   Title Pt demonstrate improved fine motor coordination as evidenced by  perfoming 9 hole peg test in 63 secs or less with RUE    Baseline 75.72, 69.09   Time 4   Period Weeks   Status Achieved  54.37 secs           OT Long Term Goals - 02/24/16 1410      OT LONG TERM GOAL #1   Title I with updated HEP.   Time 8   Status Achieved     OT LONG TERM GOAL #2   Title Pt will perform simple cooking task in standing with supervision demonstrating good safety awareness.- Revised goal-Pt will perform simple cooking task in standing with min A.   Time 8   Period Weeks   Status Partially Met  pt is able to assist with prep not stovetop activities     OT LONG TERM GOAL #3   Title Pt will demonstrate ability to retrieve a light weight object at 120 shoulder flexion with RUE   Time 8   Period Weeks   Status On-going     OT LONG TERM GOAL #4   Title Pt will resume use of RUE as dominant hand at least 75% of the time for ADLs/IADLs.   Time 8   Period Weeks   Status Achieved     OT LONG TERM GOAL #5   Title Pt will decrease RUE 9 hole peg test score to 49 secs from for increased fine motor coordination. - Revised pt will complete 9 hole peg test in 54 secs or less with RUE.   Baseline RUE 59 secs   Time 8   Period Weeks   Status Not Met  63.97 ,     OT LONG TERM GOAL #6   Title Pt will be modified indpendent with all basic ADLs.   Time 8   Period Weeks   Status Achieved               Plan - 02/24/16 1544    Clinical Impression Statement Pt demonstrates progress towards goals and pt/ family agree with plans for d/c next visit.   Rehab Potential Good   OT Frequency 2x / week   OT Duration 8 weeks   OT Treatment/Interventions Self-care/ADL training;Therapeutic exercise;Patient/family education;Functional Mobility Training;Neuromuscular education;Manual Therapy;Therapeutic exercises;DME and/or AE instruction;Therapeutic activities;Electrical Stimulation;Fluidtherapy;Cognitive remediation/compensation;Moist Heat;Passive range of  motion;Visual/perceptual remediation/compensation   Plan check remaining goals(STG# 2, #5-handwriting, and remaining long term goals and d/c   Consulted and Agree with Plan of Care Patient;Family  member/caregiver   Family Member Consulted daughter      Patient will benefit from skilled therapeutic intervention in order to improve the following deficits and impairments:  Abnormal gait, Decreased coordination, Decreased range of motion, Difficulty walking, Decreased safety awareness, Decreased endurance, Decreased activity tolerance, Decreased knowledge of precautions, Impaired UE functional use, Decreased knowledge of use of DME, Decreased balance, Decreased cognition, Decreased mobility, Decreased strength, Impaired perceived functional ability  Visit Diagnosis: Hemiparesis of right dominant side, unspecified hemiparesis etiology (HCC)  Muscle weakness (generalized)  Other lack of coordination    Problem List Patient Active Problem List   Diagnosis Date Noted  . Somnolence, daytime 12/08/2015  . Cerebral thrombosis with cerebral infarction 09/07/2015  . Lacunar infarct, acute ([place])   . Nausea & vomiting 09/05/2015  . Acute kidney injury ([place]) 09/04/2015  . Type II diabetes mellitus with neurological manifestations, uncontrolled ([place]) 09/03/2015  . Bilateral lower extremity edema 09/03/2015  . Essential hypertension   . Diabetes mellitus type 2 in nonobese (HCC)   . History of breast cancer   . AKI (acute kidney injury) ([place])   . Acute blood loss anemia   . Hemiparesis ([place])   . Gait disturbance, post-stroke   . Dysphagia, post-stroke   . Acute ischemic stroke/right cerebellar 08/24/2015  . Breast cancer of upper-outer quadrant of right female breast ([place]) 04/30/2015  . CVA (cerebral infarction) 07/05/2014  . Hyperlipidemia 07/05/2014  . Syncope 07/04/2014  . Mild mitral regurgitation by prior echocardiogram 12/20/2011  . Diabetes mellitus type 1, uncontrolled, insulin  dependent ([place]) 02/26/2009  . OBESITY 02/26/2009  . Uncontrolled hypertension 02/26/2009  . CAD (coronary artery disease) 02/26/2009  . GASTROESOPHAGEAL REFLUX DISEASE 02/26/2009    Karey Suthers 02/24/2016, 3:53 PM .Theone Murdoch, OTR/L [phone] Phone: [phone] 3:54 PM 10/31/17[facility] [address] [place], [place], [address] Phone: [phone]   Fax:  [phone]  Name: Glenda [name] MRN: [id number] Date of Birth: April 15, 1934

## 2016-02-25 NOTE — Therapy (Signed)
[facility] [address] [place], [place], [address] Phone: [phone]   Fax:  [phone]  Physical Therapy Treatment  Patient Details  Name: Glenda [name] MRN: [id number] Date of Birth: 07-Jan-1934 Referring Provider: Donnie Coffin, MD  Encounter Date: 02/24/2016      PT End of Session - 02/24/16 1450    Visit Number 31   Number of Visits 33  updated POC   Date for PT Re-Evaluation 02/29/16   Authorization Type UHC MCR- G code needed every 10th visit   PT Start Time 1446   PT Stop Time 1530   PT Time Calculation (min) 44 min   Equipment Utilized During Treatment Gait belt   Activity Tolerance Patient tolerated treatment well;Patient limited by fatigue   Behavior During Therapy [facility] for tasks assessed/performed      Past Medical History:  Diagnosis Date  . Arthritis   . Breast cancer ([place])   . CAD (coronary artery disease)   . Chest pain   . Dizziness   . DM (diabetes mellitus) ([place])   . GERD (gastroesophageal reflux disease)   . HLD (hyperlipidemia)   . HTN (hypertension)   . Obesity   . Radiation 09/11/15-10/13/15   42.72 Gy to right breast, 12 Gy boost to right breast  . Shortness of breath dyspnea   . Stroke ([place])   . UTI (urinary tract infection)     Past Surgical History:  Procedure Laterality Date  . CHOLECYSTECTOMY    . colon polyps; hx    . CORONARY ANGIOPLASTY  02/14/09  . ESOPHAGOGASTRODUODENOSCOPY (EGD) WITH PROPOFOL Left 09/08/2015   Procedure: ESOPHAGOGASTRODUODENOSCOPY (EGD) WITH PROPOFOL;  Surgeon: Clarene Essex, MD;  Location: [facility];  Service: Endoscopy;  Laterality: Left;  . RADIOACTIVE SEED GUIDED MASTECTOMY WITH AXILLARY SENTINEL LYMPH NODE BIOPSY Right 07/17/2015   Procedure: RADIOACTIVE SEED GUIDED PARTIAL MASTECTOMY WITH AXILLARY SENTINEL LYMPH NODE BIOPSY;  Surgeon: Erroll Luna, MD;  Location: [facility];  Service: General;  Laterality: Right;    There were no vitals  filed for this visit.      Subjective Assessment - 02/24/16 1450    Subjective No new complaints, no fall or pain to report.    Patient is accompained by: Family member   Limitations Walking;House hold activities   Patient Stated Goals "I want to work on walking"    Currently in Pain? No/denies   Pain Score 0-No pain            [facility] Adult PT Treatment/Exercise - 02/24/16 1453      Transfers   Transfers Sit to Stand;Stand to Sit   Sit to Stand 4: Min guard;With upper extremity assist;From bed   Sit to Stand Details Verbal cues for precautions/safety;Verbal cues for safe use of DME/AE;Manual facilitation for weight shifting;Manual facilitation for weight bearing   Stand to Sit 4: Min guard;With upper extremity assist;To bed;Uncontrolled descent   Stand to Sit Details (indicate cue type and reason) Verbal cues for precautions/safety;Verbal cues for safe use of DME/AE;Manual facilitation for weight bearing;Manual facilitation for weight shifting   Number of Reps 2 sets;Other reps (comment)  10 reps, 5 reps     Ambulation/Gait   Ambulation/Gait Yes   Ambulation/Gait Assistance 4: Min guard;5: Supervision   Ambulation/Gait Assistance Details supervision progressing to min guard assist as pt fatigued. 2 standing rest breaks needed due to pt reported fatigue. notably increased episodes of right toe/foot scuffing with increased distance.  Ambulation Distance (Feet) 500 Feet   Assistive device Rolling walker   Gait Pattern Step-through pattern;Decreased stride length;Decreased step length - right;Decreased hip/knee flexion - right;Shuffle;Trunk flexed;Narrow base of support;Poor foot clearance - right   Ambulation Surface Level;Unlevel;Indoor;Outdoor;Paved             PT Short Term Goals - 12/03/15 1107      PT SHORT TERM GOAL #1   Title Pt will initiate HEP for strengthening and balance in order to improve functional mobility.  (Target Date: 11/28/15)    Baseline 12/03/15: met today after reinstruction with pictures to daughter/pt   Status Achieved     PT SHORT TERM GOAL #2   Title Pt will improve TUG score to <80 seconds in order to indicate decreased fall risk.     Baseline 11/25/15: 52.44 sec's with RW and simulated toe cap    Status Achieved     PT SHORT TERM GOAL #3   Title Will assess BERG and improve score by 4 points in order to indicate decreased fall risk and functional improvement in balance.     Baseline 11/27/15: 17/56 today (was 12/56 at baseline)   Status Achieved     PT SHORT TERM GOAL #4   Title Pt will ambulate over indoor surfaces x 150' w/ RW at S level in order to indicate safe negotiation in home.     Baseline 11/25/15: pt able to ambulate this distance with varied amout of assistance (min guard to min assist)   Status Partially Met     PT SHORT TERM GOAL #5   Title Pt will verbalize fall prevention strategies in order to indicate decreased fall risk inside and outside of home.     Baseline 12/03/15: fall prevention strategies issued today with review with pt and daughter   Status Partially Met     PT SHORT TERM GOAL #6   Title Pt will perform 8/10 sit <>stands with single UE support only at mod I level in order to indicate improved functional strength.     Baseline 12/03/15: pt continues to need min>mod assist to power up and to control descent with sitting down using 1-2 UE's to assist.   Status Not Met           PT Long Term Goals - 02/24/16 1451      PT LONG TERM GOAL #1   Title Pt will be independent with HEP in order to indicate improved functional mobility and decreased fall risk.  (Modified Target Date: 02/27/16)   Baseline 01/29/16: met per pt and daughter's report - reports doing them "occasionally". both report doing the walking program daily.   Status Achieved     PT LONG TERM GOAL #2   Title Pt will improve TUG score to <30 secs in order to indicate decreased fall risk.     Baseline 01/27/16: 37.2 sec's  with RW today   Status Revised     PT LONG TERM GOAL #3   Title Pt will improve BERG balance score to 32/56 in order to indicate decreased fall risk.     Baseline 01/27/16: 28/56 today (was 10/56 on 12/25/15)   Status Revised     PT LONG TERM GOAL #4   Title Pt will perform 8/10 sit<>stand without UE support at mod I level in order to indicate improved functional strength.     Baseline 02/24/16: pt continued to need UE support from all surface heights.   Status Not Met  PT LONG TERM GOAL #5   Title Pt will ambulate 500' w/ LRAD over paved outdoor surfaces at mod I level in order to indicate safe return to community.     Baseline 02/24/16; pt able to ambulate for 500 consecutive feet with RW and supervision/min guard assist for safety.   Status Partially Met     PT LONG TERM GOAL #6   Title Pt will negotiate up/down 5 steps w/ single rail at mod I level in order to indicate safe entry into home/family's homes.     Baseline 01/29/16: pt able to negotiate the stairs with 1 rail at min guard to min assist level   Status On-going           Plan - 02/24/16 1451    Clinical Impression Statement today's skilled session focused on gait and transfers. Pt with increased consecutive gait distance today before needing to sit for a rest break. Pt is making steady progress toward goals and will assess remaining goals next visit for anticipated discharge.   Rehab Potential Excellent   PT Frequency 2x / week   PT Duration 4 weeks   PT Treatment/Interventions ADLs/Self Care Home Management;Electrical Stimulation;DME Instruction;Gait training;Stair training;Functional mobility training;Therapeutic activities;Therapeutic exercise;Balance training;Neuromuscular re-education;Patient/family education;Orthotic Fit/Training;Vestibular;Visual/perceptual remediation/compensation   PT Next Visit Plan assess remaining goals for anticipated discharge.    Consulted and Agree with Plan of Care Patient;Family  member/caregiver   Family Member Consulted daughter,       Patient will benefit from skilled therapeutic intervention in order to improve the following deficits and impairments:  Abnormal gait, Decreased activity tolerance, Decreased balance, Decreased coordination, Decreased endurance, Decreased knowledge of use of DME, Decreased mobility, Decreased strength, Dizziness, Increased edema, Impaired perceived functional ability, Impaired flexibility, Impaired UE functional use, Impaired vision/preception, Improper body mechanics, Postural dysfunction  Visit Diagnosis: Muscle weakness (generalized)  Unsteadiness on feet  Other abnormalities of gait and mobility  Hemiparesis of right dominant side, unspecified hemiparesis etiology [facility])     Problem List Patient Active Problem List   Diagnosis Date Noted  . Somnolence, daytime 12/08/2015  . Cerebral thrombosis with cerebral infarction 09/07/2015  . Lacunar infarct, acute ([place])   . Nausea & vomiting 09/05/2015  . Acute kidney injury ([place]) 09/04/2015  . Type II diabetes mellitus with neurological manifestations, uncontrolled ([place]) 09/03/2015  . Bilateral lower extremity edema 09/03/2015  . Essential hypertension   . Diabetes mellitus type 2 in nonobese (HCC)   . History of breast cancer   . AKI (acute kidney injury) ([place])   . Acute blood loss anemia   . Hemiparesis ([place])   . Gait disturbance, post-stroke   . Dysphagia, post-stroke   . Acute ischemic stroke/right cerebellar 08/24/2015  . Breast cancer of upper-outer quadrant of right female breast ([place]) 04/30/2015  . CVA (cerebral infarction) 07/05/2014  . Hyperlipidemia 07/05/2014  . Syncope 07/04/2014  . Mild mitral regurgitation by prior echocardiogram 12/20/2011  . Diabetes mellitus type 1, uncontrolled, insulin dependent ([place]) 02/26/2009  . OBESITY 02/26/2009  . Uncontrolled hypertension 02/26/2009  . CAD (coronary artery disease) 02/26/2009  . GASTROESOPHAGEAL REFLUX  DISEASE 02/26/2009    Willow Ora, PTA, [facility] [address], [place], [place] [address] [phone] 02/25/16, 4:06 PM   Name: Glenda [name] MRN: [id number] Date of Birth: [birth year]

## 2016-02-26 ENCOUNTER — Ambulatory Visit: Payer: 59 | Admitting: Occupational Therapy

## 2016-02-26 ENCOUNTER — Ambulatory Visit: Payer: 59 | Attending: Family Medicine | Admitting: Rehabilitation

## 2016-02-26 ENCOUNTER — Encounter: Payer: Self-pay | Admitting: Rehabilitation

## 2016-02-26 DIAGNOSIS — R2689 Other abnormalities of gait and mobility: Secondary | ICD-10-CM

## 2016-02-26 DIAGNOSIS — M6281 Muscle weakness (generalized): Secondary | ICD-10-CM

## 2016-02-26 DIAGNOSIS — G8191 Hemiplegia, unspecified affecting right dominant side: Secondary | ICD-10-CM | POA: Insufficient documentation

## 2016-02-26 DIAGNOSIS — R2681 Unsteadiness on feet: Secondary | ICD-10-CM | POA: Diagnosis present

## 2016-02-26 NOTE — Therapy (Signed)
[facility] [address] [place], [place], [address] Phone: [phone]   Fax:  [phone]  Physical Therapy Treatment and D/C Summary   Patient Details  Name: Glenda [name] MRN: [id number] Date of Birth: Dec 31, 1933 Referring Provider: Donnie Coffin, MD  Encounter Date: 02/26/2016      PT End of Session - 02/26/16 0936    Visit Number 32   Number of Visits 33  updated POC   Date for PT Re-Evaluation 02/29/16   Authorization Type UHC MCR- G code needed every 10th visit   PT Start Time 0932   PT Stop Time 1016   PT Time Calculation (min) 44 min   Equipment Utilized During Treatment Gait belt   Activity Tolerance Patient tolerated treatment well;Patient limited by fatigue   Behavior During Therapy [facility] for tasks assessed/performed      Past Medical History:  Diagnosis Date  . Arthritis   . Breast cancer ([place])   . CAD (coronary artery disease)   . Chest pain   . Dizziness   . DM (diabetes mellitus) ([place])   . GERD (gastroesophageal reflux disease)   . HLD (hyperlipidemia)   . HTN (hypertension)   . Obesity   . Radiation 09/11/15-10/13/15   42.72 Gy to right breast, 12 Gy boost to right breast  . Shortness of breath dyspnea   . Stroke ([place])   . UTI (urinary tract infection)     Past Surgical History:  Procedure Laterality Date  . CHOLECYSTECTOMY    . colon polyps; hx    . CORONARY ANGIOPLASTY  02/14/09  . ESOPHAGOGASTRODUODENOSCOPY (EGD) WITH PROPOFOL Left 09/08/2015   Procedure: ESOPHAGOGASTRODUODENOSCOPY (EGD) WITH PROPOFOL;  Surgeon: Clarene Essex, MD;  Location: [facility] ENDOSCOPY;  Service: Endoscopy;  Laterality: Left;  . RADIOACTIVE SEED GUIDED MASTECTOMY WITH AXILLARY SENTINEL LYMPH NODE BIOPSY Right 07/17/2015   Procedure: RADIOACTIVE SEED GUIDED PARTIAL MASTECTOMY WITH AXILLARY SENTINEL LYMPH NODE BIOPSY;  Surgeon: Erroll Luna, MD;  Location: [facility];  Service: General;  Laterality: Right;    There  were no vitals filed for this visit.      Subjective Assessment - 02/26/16 0935    Subjective "I walked almost around the building the other day.  [place] the [place] and did some walking. "   Patient is accompained by: Family member   Limitations Walking;House hold activities   Currently in Pain? No/denies                         [facility] Adult PT Treatment/Exercise - 02/26/16 0943      Standardized Balance Assessment   Standardized Balance Assessment Berg Balance Test     Berg Balance Test   Sit to Stand Able to stand  independently using hands   Standing Unsupported Able to stand 30 seconds unsupported   Sitting with Back Unsupported but Feet Supported on Floor or Stool Able to sit safely and securely 2 minutes   Stand to Sit Controls descent by using hands   Transfers Able to transfer with verbal cueing and /or supervision   Standing Unsupported with Eyes Closed Able to stand 3 seconds   Standing Ubsupported with Feet Together Needs help to attain position but able to stand for 30 seconds with feet together   From Standing, Reach Forward with Outstretched Arm Can reach forward >12 cm safely (5")   From Standing Position, Pick up Object from Floor Able to pick up shoe, needs  supervision   From Standing Position, Turn to Look Behind Over each Shoulder Turn sideways only but maintains balance   Turn 360 Degrees Needs assistance while turning   Standing Unsupported, Alternately Place Feet on Step/Stool Needs assistance to keep from falling or unable to try   Standing Unsupported, One Foot in Front Needs help to step but can hold 15 seconds   Standing on One Leg Unable to try or needs assist to prevent fall   Total Score 26       TA:  TUG performed x 2 reps with times of 43.03 secs and 39.06 with RW.  Note this is decreased from STG status.  Feel that it is requiring more time for her to perform sit<>stand at this time with cues for safety.   Self  Care:  Continue to discuss pt and family walking more at a community level to work on endurance and activity tolerance as well as challenge balance.  Both verbalize understanding.    NMR:  BERG balance as above.   Gait:  Performed stairs x 2 reps with single rail at mod I level during session.  Use of R rail during first rep and L rail during second rep in order to better simulate daughter's homes.          PT Education - 02/26/16 0936    Education provided Yes   Education Details continue to educate on beginning community level ambulation and increasing activity at home.    Person(s) Educated Patient;Child(ren)   Methods Explanation   Comprehension Verbalized understanding          PT Short Term Goals - 12/03/15 1107      PT SHORT TERM GOAL #1   Title Pt will initiate HEP for strengthening and balance in order to improve functional mobility.  (Target Date: 11/28/15)   Baseline 12/03/15: met today after reinstruction with pictures to daughter/pt   Status Achieved     PT SHORT TERM GOAL #2   Title Pt will improve TUG score to <80 seconds in order to indicate decreased fall risk.     Baseline 11/25/15: 52.44 sec's with RW and simulated toe cap    Status Achieved     PT SHORT TERM GOAL #3   Title Will assess BERG and improve score by 4 points in order to indicate decreased fall risk and functional improvement in balance.     Baseline 11/27/15: 17/56 today (was 12/56 at baseline)   Status Achieved     PT SHORT TERM GOAL #4   Title Pt will ambulate over indoor surfaces x 150' w/ RW at S level in order to indicate safe negotiation in home.     Baseline 11/25/15: pt able to ambulate this distance with varied amout of assistance (min guard to min assist)   Status Partially Met     PT SHORT TERM GOAL #5   Title Pt will verbalize fall prevention strategies in order to indicate decreased fall risk inside and outside of home.     Baseline 12/03/15: fall prevention strategies issued today with  review with pt and daughter   Status Partially Met     PT SHORT TERM GOAL #6   Title Pt will perform 8/10 sit <>stands with single UE support only at mod I level in order to indicate improved functional strength.     Baseline 12/03/15: pt continues to need min>mod assist to power up and to control descent with sitting down using 1-2 UE's to assist.  Status Not Met           PT Long Term Goals - 02/26/16 5053      PT LONG TERM GOAL #1   Title Pt will be independent with HEP in order to indicate improved functional mobility and decreased fall risk.  (Modified Target Date: 02/27/16)   Baseline 01/29/16: met per pt and daughter's report - reports doing them "occasionally". both report doing the walking program daily.   Status Achieved     PT LONG TERM GOAL #2   Title Pt will improve TUG score to <30 secs in order to indicate decreased fall risk.     Baseline 01/27/16: 37.2 sec's with RW today, 39.06 on 02/26/16   Status Not Met     PT LONG TERM GOAL #3   Title Pt will improve BERG balance score to 32/56 in order to indicate decreased fall risk.     Baseline 01/27/16: 28/56 today (was 10/56 on 12/25/15) 26/56 on 02/26/16   Status Not Met     PT LONG TERM GOAL #4   Title Pt will perform 8/10 sit<>stand without UE support at mod I level in order to indicate improved functional strength.     Baseline 02/24/16: pt continued to need UE support from all surface heights.   Status Not Met     PT LONG TERM GOAL #5   Title Pt will ambulate 500' w/ LRAD over paved outdoor surfaces at mod I level in order to indicate safe return to community.     Baseline 02/24/16; pt able to ambulate for 500 consecutive feet with RW and supervision/min guard assist for safety.   Status Partially Met     PT LONG TERM GOAL #6   Title Pt will negotiate up/down 5 steps w/ single rail at mod I level in order to indicate safe entry into home/family's homes.     Baseline met 02/26/16   Status Achieved                Plan - 02/26/16 1441    Clinical Impression Statement Skilled session focused on assessment of LTGs and D/C from therapy.  Note she has met 2/6 LTGs and partially met another goal for gait outdoors.  She continues to demonstrate poor postural control, perceptual deficits and decreased balance, however feel that she has not been compliant with HEP per reports from pt and family.  Feel that she needs to work on tasks at home and increasing activity at home and therefore pt and family agree with D/C.     Rehab Potential Excellent   PT Frequency 2x / week   PT Duration 4 weeks   PT Treatment/Interventions ADLs/Self Care Home Management;Electrical Stimulation;DME Instruction;Gait training;Stair training;Functional mobility training;Therapeutic activities;Therapeutic exercise;Balance training;Neuromuscular re-education;Patient/family education;Orthotic Fit/Training;Vestibular;Visual/perceptual remediation/compensation   PT Next Visit Plan --   Consulted and Agree with Plan of Care Patient;Family member/caregiver   Family Member Consulted daughter,       Patient will benefit from skilled therapeutic intervention in order to improve the following deficits and impairments:  Abnormal gait, Decreased activity tolerance, Decreased balance, Decreased coordination, Decreased endurance, Decreased knowledge of use of DME, Decreased mobility, Decreased strength, Dizziness, Increased edema, Impaired perceived functional ability, Impaired flexibility, Impaired UE functional use, Impaired vision/preception, Improper body mechanics, Postural dysfunction  Visit Diagnosis: Hemiparesis of right dominant side, unspecified hemiparesis etiology (HCC)  Muscle weakness (generalized)  Unsteadiness on feet  Other abnormalities of gait and mobility       G-Codes -  02/26/16 1909    Functional Assessment Tool Used TUG: 39.06 secs, S for outdoor gait up to 500' and 26/56 on BERG   Functional Limitation Mobility:  Walking and moving around   Mobility: Walking and Moving Around Current Status [phone]) At least 40 percent but less than 60 percent impaired, limited or restricted   Mobility: Walking and Moving Around Goal Status [phone]) At least 1 percent but less than 20 percent impaired, limited or restricted   Mobility: Walking and Moving Around Discharge Status [phone]) At least 40 percent but less than 60 percent impaired, limited or restricted       PHYSICAL THERAPY DISCHARGE SUMMARY  Visits from Start of Care: 32  Current functional level related to goals / functional outcomes: See LTGs    Remaining deficits: She continues to have attention, perceptual, neglect, balance and endurance deficits.  Has HEP to address these deficits.     Education / Equipment: HEP  Plan: Patient agrees to discharge.  Patient goals were partially met. Patient is being discharged due to lack of progress.  ?????       Problem List Patient Active Problem List   Diagnosis Date Noted  . Somnolence, daytime 12/08/2015  . Cerebral thrombosis with cerebral infarction 09/07/2015  . Lacunar infarct, acute ([place])   . Nausea & vomiting 09/05/2015  . Acute kidney injury ([place]) 09/04/2015  . Type II diabetes mellitus with neurological manifestations, uncontrolled ([place]) 09/03/2015  . Bilateral lower extremity edema 09/03/2015  . Essential hypertension   . Diabetes mellitus type 2 in nonobese (HCC)   . History of breast cancer   . AKI (acute kidney injury) ([place])   . Acute blood loss anemia   . Hemiparesis ([place])   . Gait disturbance, post-stroke   . Dysphagia, post-stroke   . Acute ischemic stroke/right cerebellar 08/24/2015  . Breast cancer of upper-outer quadrant of right female breast ([place]) 04/30/2015  . CVA (cerebral infarction) 07/05/2014  . Hyperlipidemia 07/05/2014  . Syncope 07/04/2014  . Mild mitral regurgitation by prior echocardiogram 12/20/2011  . Diabetes mellitus type 1, uncontrolled, insulin dependent  ([place]) 02/26/2009  . OBESITY 02/26/2009  . Uncontrolled hypertension 02/26/2009  . CAD (coronary artery disease) 02/26/2009  . GASTROESOPHAGEAL REFLUX DISEASE 02/26/2009    Cameron Sprang, PT, MPT [facility] [address] [place], [place], [address] Phone: [phone]   Fax:  [phone] 02/26/16, 7:18 PM  Name: Rosemond Lyttle MRN: [id number] Date of Birth: [birth year]

## 2016-02-26 NOTE — Therapy (Signed)
[facility] [address] [place], [place], [address] Phone: [phone]   Fax:  [phone]  Occupational Therapy Treatment  Patient Details  Name: Glenda [name] MRN: [id number] Date of Birth: [birth year] Referring Provider: Dr. Donnie Coffin  Encounter Date: 02/26/2016      OT End of Session - 02/26/16 1059    Visit Number 30   Number of Visits 32   Date for OT Re-Evaluation 03/07/16   Authorization Type UHC MCR- short term goals due 02/06/16   Authorization - Visit Number 41   Authorization - Number of Visits 30   OT Start Time 2876   OT Stop Time 1055   OT Time Calculation (min) 40 min   Activity Tolerance Patient tolerated treatment well      Past Medical History:  Diagnosis Date  . Arthritis   . Breast cancer ([place])   . CAD (coronary artery disease)   . Chest pain   . Dizziness   . DM (diabetes mellitus) ([place])   . GERD (gastroesophageal reflux disease)   . HLD (hyperlipidemia)   . HTN (hypertension)   . Obesity   . Radiation 09/11/15-10/13/15   42.72 Gy to right breast, 12 Gy boost to right breast  . Shortness of breath dyspnea   . Stroke ([place])   . UTI (urinary tract infection)     Past Surgical History:  Procedure Laterality Date  . CHOLECYSTECTOMY    . colon polyps; hx    . CORONARY ANGIOPLASTY  02/14/09  . ESOPHAGOGASTRODUODENOSCOPY (EGD) WITH PROPOFOL Left 09/08/2015   Procedure: ESOPHAGOGASTRODUODENOSCOPY (EGD) WITH PROPOFOL;  Surgeon: Clarene Essex, MD;  Location: [facility] ENDOSCOPY;  Service: Endoscopy;  Laterality: Left;  . RADIOACTIVE SEED GUIDED MASTECTOMY WITH AXILLARY SENTINEL LYMPH NODE BIOPSY Right 07/17/2015   Procedure: RADIOACTIVE SEED GUIDED PARTIAL MASTECTOMY WITH AXILLARY SENTINEL LYMPH NODE BIOPSY;  Surgeon: Erroll Luna, MD;  Location: [facility];  Service: General;  Laterality: Right;    There were no vitals filed for this visit.      Subjective Assessment - 02/26/16 1019     Subjective  I'm ready for d/c   Pertinent History CVA 08/24/15   Patient Stated Goals To do everything I did before   Currently in Pain? No/denies                      OT Treatments/Exercises (OP) - 02/26/16 0001      ADLs   Writing pt practiced writing name at 100% legibility. Pt practiced writing short paragraph at approx. 80% legibility with noted decr. in letter size near end of each word. Recommended exxagerating each letter especially at end of word.    ADL Comments Discussed/reviewed safety recommendations for balance and fall prevention with pt/daughter - they verbalized understanding. Assessed remaining goals and reviewed with pt/daughter     Functional Reaching Activities   High Level High level reaching to place rubber washers on various height prongs. Pt able to achieve 112 degrees sh. flexion. Pt performed task seated and in standing                  OT Short Term Goals - 02/26/16 1045      OT SHORT TERM GOAL #1   Title I with inital HEP.   Time 4   Period Weeks   Status Achieved     OT SHORT TERM GOAL #2   Title Pt will demonstrate ability to retrieve a lightweight object at 110* with  RUE.   Time 4   Period Weeks   Status Achieved  112*     OT SHORT TERM GOAL #3   Title Pt will demonstrate improved fine motor coordination as evidenced by decreasing 9 hole peg test score by 4 secs bilaterally.   Status Partially Met  met for LUE only 36.40, RUE 75.72, 69.09     OT SHORT TERM GOAL #4   Title Pt will perform simple snack prep or light home management task in standing with min A without LOB demonstrating good safety awareness.   Time 4   Period Weeks   Status Achieved     OT SHORT TERM GOAL #5   Title Pt will demonstrate ability to write a short paragraph with 95% legibility.   Time 4   Period Weeks   Status Not Met  approx. 80-85%     OT SHORT TERM GOAL #6   Title Further assess cognition and set goal PRN   Time 4   Period Weeks    Status Deferred     OT SHORT TERM GOAL #7   Title Pt will demonstrate ability to navigate a busy environment and locate items with 80% accuracy.   Time 4   Period Weeks   Status Achieved  85%     OT SHORT TERM GOAL #8   Title Pt demonstrate improved fine motor coordination as evidenced by perfoming 9 hole peg test in 63 secs or less with RUE    Baseline 75.72, 69.09   Time 4   Period Weeks   Status Achieved  54.37 secs           OT Long Term Goals - 02/26/16 1046      OT LONG TERM GOAL #1   Title I with updated HEP.   Time 8   Status Achieved     OT LONG TERM GOAL #2   Title Pt will perform simple cooking task in standing with supervision demonstrating good safety awareness.- Revised goal-Pt will perform simple cooking task in standing with min A.   Time 8   Period Weeks   Status Partially Met  pt is able to assist with prep not stovetop activities     OT LONG TERM GOAL #3   Title Pt will demonstrate ability to retrieve a light weight object at 120 shoulder flexion with RUE   Time 8   Period Weeks   Status Not Met  112*     OT LONG TERM GOAL #4   Title Pt will resume use of RUE as dominant hand at least 75% of the time for ADLs/IADLs.   Time 8   Period Weeks   Status Achieved     OT LONG TERM GOAL #5   Title Pt will decrease RUE 9 hole peg test score to 49 secs from for increased fine motor coordination. - Revised pt will complete 9 hole peg test in 54 secs or less with RUE.   Baseline RUE 59 secs   Time 8   Period Weeks   Status Not Met  63.97 ,     OT LONG TERM GOAL #6   Title Pt will be modified indpendent with all basic ADLs.   Time 8   Period Weeks   Status Achieved               Plan - 02/26/16 1148    Clinical Impression Statement Pt met STG #2. Pt did not meet LTG #3 however. Pt overall  has improved and pt's family educated on safety, fall prevention and maintaining independence with ADLS.    Plan D/C OT   Consulted and Agree with  Plan of Care Patient;Family member/caregiver      Patient will benefit from skilled therapeutic intervention in order to improve the following deficits and impairments:     Visit Diagnosis: Hemiparesis of right dominant side, unspecified hemiparesis etiology (HCC)  Muscle weakness (generalized)  Other abnormalities of gait and mobility      G-Codes - 09-Mar-2016 1151    Functional Assessment Tool Used Supervision with basic ADLS, 9 hole peg test RUE: 63.97 sec   Functional Limitation Self care   Self Care Goal Status ([id number]) At least 20 percent but less than 40 percent impaired, limited or restricted   Self Care Discharge Status [phone]) At least 20 percent but less than 40 percent impaired, limited or restricted      Problem List Patient Active Problem List   Diagnosis Date Noted  . Somnolence, daytime 12/08/2015  . Cerebral thrombosis with cerebral infarction 09/07/2015  . Lacunar infarct, acute ([place])   . Nausea & vomiting 09/05/2015  . Acute kidney injury ([place]) 09/04/2015  . Type II diabetes mellitus with neurological manifestations, uncontrolled ([place]) 09/03/2015  . Bilateral lower extremity edema 09/03/2015  . Essential hypertension   . Diabetes mellitus type 2 in nonobese (HCC)   . History of breast cancer   . AKI (acute kidney injury) ([place])   . Acute blood loss anemia   . Hemiparesis ([place])   . Gait disturbance, post-stroke   . Dysphagia, post-stroke   . Acute ischemic stroke/right cerebellar 08/24/2015  . Breast cancer of upper-outer quadrant of right female breast ([place]) 04/30/2015  . CVA (cerebral infarction) 07/05/2014  . Hyperlipidemia 07/05/2014  . Syncope 07/04/2014  . Mild mitral regurgitation by prior echocardiogram 12/20/2011  . Diabetes mellitus type 1, uncontrolled, insulin dependent ([place]) 02/26/2009  . OBESITY 02/26/2009  . Uncontrolled hypertension 02/26/2009  . CAD (coronary artery disease) 02/26/2009  . GASTROESOPHAGEAL REFLUX DISEASE 02/26/2009       OCCUPATIONAL THERAPY DISCHARGE SUMMARY  Visits from Start of Care: 30   Current functional level related to goals / functional outcomes: SEE ABOVE   Remaining deficits: Safety Functional mobility Coordination Body awareness Endurance   Education / Equipment: HEP's, pt/family education, fall prevention  Plan: Patient agrees to discharge.  Patient goals were partially met. Patient is being discharged due to being pleased with the current functional level.  ?????       Carey Bullocks, OTR/L 03/09/2016, 11:53 AM  [facility] [address] [place], [place], [address] Phone: [phone]   Fax:  [phone]  Name: Fabienne Nolasco MRN: [id number] Date of Birth: [birth year]

## 2016-03-02 ENCOUNTER — Telehealth: Payer: Self-pay

## 2016-03-02 NOTE — Telephone Encounter (Signed)
Email received from [facility]:   ngela Irish Lack, RN        FYI  Daughter has spoken to [place] and due to pt financial amount she hasn't set up cpap yet. Daughter Jordan Hawks is supposed to call us back to schedule.   Thanks,  Angie

## 2016-03-11 ENCOUNTER — Ambulatory Visit: Payer: Medicare Other | Admitting: Nurse Practitioner

## 2016-04-09 ENCOUNTER — Ambulatory Visit
Admission: RE | Admit: 2016-04-09 | Discharge: 2016-04-09 | Disposition: A | Discharge status: Home / Self Care | Payer: Medicare Other | Source: Ambulatory Visit | Attending: Hematology and Oncology | Admitting: Hematology and Oncology

## 2016-04-09 DIAGNOSIS — C50411 Malignant neoplasm of upper-outer quadrant of right female breast: Secondary | ICD-10-CM

## 2016-06-03 ENCOUNTER — Ambulatory Visit: Payer: Medicare Other | Admitting: Radiation Oncology

## 2016-06-13 ENCOUNTER — Emergency Department (HOSPITAL_COMMUNITY): Payer: Medicare Other

## 2016-06-13 ENCOUNTER — Emergency Department (HOSPITAL_COMMUNITY)
Admission: EM | Admit: 2016-06-13 | Discharge: 2016-06-13 | Disposition: A | Discharge status: Home / Self Care | Payer: Medicare Other | Attending: Emergency Medicine | Admitting: Emergency Medicine

## 2016-06-13 ENCOUNTER — Encounter (HOSPITAL_COMMUNITY): Payer: Self-pay

## 2016-06-13 DIAGNOSIS — I2510 Atherosclerotic heart disease of native coronary artery without angina pectoris: Secondary | ICD-10-CM | POA: Insufficient documentation

## 2016-06-13 DIAGNOSIS — Y929 Unspecified place or not applicable: Secondary | ICD-10-CM | POA: Diagnosis not present

## 2016-06-13 DIAGNOSIS — S0990XA Unspecified injury of head, initial encounter: Secondary | ICD-10-CM | POA: Insufficient documentation

## 2016-06-13 DIAGNOSIS — E119 Type 2 diabetes mellitus without complications: Secondary | ICD-10-CM | POA: Diagnosis not present

## 2016-06-13 DIAGNOSIS — Z955 Presence of coronary angioplasty implant and graft: Secondary | ICD-10-CM | POA: Insufficient documentation

## 2016-06-13 DIAGNOSIS — I10 Essential (primary) hypertension: Secondary | ICD-10-CM | POA: Insufficient documentation

## 2016-06-13 DIAGNOSIS — Z7982 Long term (current) use of aspirin: Secondary | ICD-10-CM | POA: Insufficient documentation

## 2016-06-13 DIAGNOSIS — S0083XA Contusion of other part of head, initial encounter: Secondary | ICD-10-CM

## 2016-06-13 DIAGNOSIS — Z79899 Other long term (current) drug therapy: Secondary | ICD-10-CM | POA: Insufficient documentation

## 2016-06-13 DIAGNOSIS — W010XXA Fall on same level from slipping, tripping and stumbling without subsequent striking against object, initial encounter: Secondary | ICD-10-CM | POA: Insufficient documentation

## 2016-06-13 DIAGNOSIS — W19XXXA Unspecified fall, initial encounter: Secondary | ICD-10-CM

## 2016-06-13 DIAGNOSIS — Z794 Long term (current) use of insulin: Secondary | ICD-10-CM | POA: Insufficient documentation

## 2016-06-13 DIAGNOSIS — Y999 Unspecified external cause status: Secondary | ICD-10-CM | POA: Insufficient documentation

## 2016-06-13 DIAGNOSIS — Z8673 Personal history of transient ischemic attack (TIA), and cerebral infarction without residual deficits: Secondary | ICD-10-CM | POA: Insufficient documentation

## 2016-06-13 DIAGNOSIS — Z853 Personal history of malignant neoplasm of breast: Secondary | ICD-10-CM | POA: Insufficient documentation

## 2016-06-13 DIAGNOSIS — S01511A Laceration without foreign body of lip, initial encounter: Secondary | ICD-10-CM

## 2016-06-13 DIAGNOSIS — Y939 Activity, unspecified: Secondary | ICD-10-CM | POA: Diagnosis not present

## 2016-06-13 LAB — CBG MONITORING, ED: Glucose-Capillary: 109 mg/dL — ABNORMAL HIGH (ref 65–99)

## 2016-06-13 MED ORDER — LIDOCAINE HCL (PF) 1 % IJ SOLN
30.0000 mL | Freq: Once | INTRAMUSCULAR | Status: AC
  Administered 2016-06-13: 30 mL
  Filled 2016-06-13: qty 30

## 2016-06-13 MED ORDER — CEPHALEXIN 500 MG PO CAPS: 500.0000 mg | ORAL_CAPSULE | Freq: Three times a day (TID) | ORAL | 0 refills | Status: DC

## 2016-06-13 MED ORDER — CEPHALEXIN 500 MG PO CAPS
500.0000 mg | ORAL_CAPSULE | Freq: Once | ORAL | Status: AC
  Administered 2016-06-13: 500 mg via ORAL
  Filled 2016-06-13: qty 1

## 2016-06-13 NOTE — ED Triage Notes (Signed)
Patient was trying to put something up at a higher level yesterday and lost her balance yesterday Family reports that the patient is weak on the right side prior to fall and when they found her she was on top of the walker. Patient has bruising and swelling to the left eye, left arm bruising and abrasion, upper lip swelling and small amount of bleeding from the nose per family.

## 2016-06-15 ENCOUNTER — Other Ambulatory Visit: Payer: Medicare Other

## 2016-06-15 ENCOUNTER — Other Ambulatory Visit: Payer: Self-pay | Admitting: Nephrology

## 2016-06-15 DIAGNOSIS — N184 Chronic kidney disease, stage 4 (severe): Secondary | ICD-10-CM

## 2016-06-15 NOTE — ED Provider Notes (Signed)
[facility] DEPT Provider Note   CSN: [id number] Arrival date & time: 06/13/16  7628     History   Chief Complaint Chief Complaint  Patient presents with  . Fall    HPI Glenda [name] is a 83 y.o. female.  HPI Patient fell yesterday after slipping while in the restroom.  She was found to have a lip laceration by her family and brought to the emergency department today for evaluation of facial swelling and upper lip laceration as well as left periorbital swelling.  No confusion.  Denies neck pain.  No weakness of her arms or legs.  No chest pain or abdominal pain.  No back pain.  No hip pain or extremity pain.   Past Medical History:  Diagnosis Date  . Arthritis   . Breast cancer ([place])   . CAD (coronary artery disease)   . Chest pain   . Dizziness   . DM (diabetes mellitus) ([place])   . GERD (gastroesophageal reflux disease)   . HLD (hyperlipidemia)   . HTN (hypertension)   . Obesity   . Radiation 09/11/15-10/13/15   42.72 Gy to right breast, 12 Gy boost to right breast  . Shortness of breath dyspnea   . Stroke ([place])   . UTI (urinary tract infection)     Patient Active Problem List   Diagnosis Date Noted  . Somnolence, daytime 12/08/2015  . Cerebral thrombosis with cerebral infarction 09/07/2015  . Lacunar infarct, acute ([facility])   . Nausea & vomiting 09/05/2015  . Type II diabetes mellitus with neurological manifestations, uncontrolled ([place]) 09/03/2015  . Bilateral lower extremity edema 09/03/2015  . Essential hypertension   . Diabetes mellitus type 2 in nonobese (H[facility])   . History of breast cancer   . Acute blood loss anemia   . Hemiparesis ([place])   . Gait disturbance, post-stroke   . Dysphagia, post-stroke   . Acute ischemic stroke/right cerebellar 08/24/2015  . Breast cancer of upper-outer quadrant of right female breast ([place]) 04/30/2015  . CVA (cerebral infarction) 07/05/2014  . Hyperlipidemia 07/05/2014  . Mild mitral regurgitation by prior echocardiogram  12/20/2011  . Diabetes mellitus type 1, uncontrolled, insulin dependent ([place]) 02/26/2009  . OBESITY 02/26/2009  . Uncontrolled hypertension 02/26/2009  . CAD (coronary artery disease) 02/26/2009  . GASTROESOPHAGEAL REFLUX DISEASE 02/26/2009    Past Surgical History:  Procedure Laterality Date  . CHOLECYSTECTOMY    . colon polyps; hx    . CORONARY ANGIOPLASTY  02/14/09  . ESOPHAGOGASTRODUODENOSCOPY (EGD) WITH PROPOFOL Left 09/08/2015   Procedure: ESOPHAGOGASTRODUODENOSCOPY (EGD) WITH PROPOFOL;  Surgeon: Clarene Essex, MD;  Location: [facility] ENDOSCOPY;  Service: Endoscopy;  Laterality: Left;  . RADIOACTIVE SEED GUIDED MASTECTOMY WITH AXILLARY SENTINEL LYMPH NODE BIOPSY Right 07/17/2015   Procedure: RADIOACTIVE SEED GUIDED PARTIAL MASTECTOMY WITH AXILLARY SENTINEL LYMPH NODE BIOPSY;  Surgeon: Erroll Luna, MD;  Location: [facility];  Service: General;  Laterality: Right;    OB History    No data available       Home Medications    Prior to Admission medications   Medication Sig Start Date End Date Taking? Authorizing Provider  amLODipine (NORVASC) 10 MG tablet Take 10 mg by mouth daily. Reported on 10/31/2015    Historical Provider, MD  aspirin 81 MG chewable tablet Chew 81 mg by mouth daily.    Historical Provider, MD  cephALEXin (KEFLEX) 500 MG capsule Take 1 capsule (500 mg total) by mouth 3 (three) times daily. 06/13/16   Jola Schmidt, MD  clonazePAM Bobbye [place]) 0.5  MG tablet Take 0.5 tablets (0.25 mg total) by mouth 3 (three) times daily as needed for anxiety. 09/09/15   Thurnell Lose, MD  cloNIDine (CATAPRES) 0.2 MG tablet Take 0.2 mg by mouth 2 (two) times daily as needed. BP > 160    Historical Provider, MD  clopidogrel (PLAVIX) 75 MG tablet Take 75 mg by mouth daily.     Historical Provider, MD  furosemide (LASIX) 80 MG tablet Take 80 mg by mouth daily as needed.     Historical Provider, MD  HUMALOG KWIKPEN 100 UNIT/ML KiwkPen Inject 22-34 Units into the skin 2 (two) times daily. Sliding  scale as follows: 80-199 = 22 units 200-299 = 26 units 300-399 = 30 units >400 = 34 units 04/30/14   Historical Provider, MD  hyaluronate sodium (RADIAPLEXRX) GEL Apply 1 application topically once. Reported on 10/31/2015    Historical Provider, MD  nebivolol (BYSTOLIC) 10 MG tablet Take 20 mg by mouth daily.    Historical Provider, MD  pantoprazole (PROTONIX) 40 MG tablet Take 40 mg by mouth 2 (two) times daily.    Historical Provider, MD  potassium chloride (KLOR-CON) 10 MEQ CR tablet Take 10 mEq by mouth 2 (two) times daily. When taking lasix    Historical Provider, MD  pravastatin (PRAVACHOL) 80 MG tablet Take 80 mg by mouth daily.    Historical Provider, MD  TOUJEO SOLOSTAR 300 UNIT/ML SOPN Inject 24 Units into the skin every morning.  11/22/14   Historical Provider, MD  valsartan-hydrochlorothiazide (DIOVAN-HCT) 320-25 MG tablet Take 1 tablet by mouth daily.    Historical Provider, MD    Family History Family History  Problem Relation Age of Onset  . Diabetes Mother   . Colon cancer Father     also had lung  . Diabetes    . Heart attack    . Diabetes Sister     [name]  . Breast cancer Sister     Social History Social History  Substance Use Topics  . Smoking status: Never Smoker  . Smokeless tobacco: Never Used  . Alcohol use No     Allergies   Patient has no known allergies.   Review of Systems Review of Systems  All other systems reviewed and are negative.    Physical Exam Updated Vital Signs BP 180/56   Pulse 72   Temp 99 F (37.2 C) (Oral)   Resp 16   Ht 5\' 6"  (1.676 m)   Wt 170 lb (77.1 kg)   SpO2 96%   BMI 27.44 kg/m   Physical Exam  Constitutional: She is oriented to person, place, and time. She appears well-developed and well-nourished. No distress.  HENT:  Head: Normocephalic.  Left periorbital ecchymosis without extraocular movement abnormalities.  No trismus or malocclusion.  Obvious midline upper lip laceration that does not involve the  vermilion border.  There has been some tissue loss.  Eyes: EOM are normal.  Neck: Normal range of motion. Neck supple.  C-spine nontender.  Cardiovascular: Normal rate, regular rhythm and normal heart sounds.   Pulmonary/Chest: Effort normal and breath sounds normal.  Abdominal: Soft. She exhibits no distension. There is no tenderness.  Musculoskeletal: Normal range of motion.  Neurological: She is alert and oriented to person, place, and time.  Skin: Skin is warm and dry.  Psychiatric: She has a normal mood and affect. Judgment normal.  Nursing note and vitals reviewed.    ED Treatments / Results  Labs (all labs ordered are listed, but only  abnormal results are displayed) Labs Reviewed  CBG MONITORING, ED - Abnormal; Notable for the following:       Result Value   Glucose-Capillary 109 (*)    All other components within normal limits    EKG  EKG Interpretation None       Radiology Ct Head Wo Contrast  Result Date: 06/13/2016 CLINICAL DATA:  Pain following fall EXAM: CT HEAD WITHOUT CONTRAST CT MAXILLOFACIAL WITHOUT CONTRAST CT CERVICAL SPINE WITHOUT CONTRAST TECHNIQUE: Multidetector CT imaging of the head, cervical spine, and maxillofacial structures were performed using the standard protocol without intravenous contrast. Multiplanar CT image reconstructions of the cervical spine and maxillofacial structures were also generated. COMPARISON:  Maxillofacial CT March 06, 2009; head CT Sep 04, 2015; brain MRI Sep 05, 2015 FINDINGS: CT HEAD FINDINGS Brain: There is mild diffuse atrophy. There is no intracranial mass, hemorrhage, extra-axial fluid collection, or midline shift. There is evidence of a prior infarct in the left midbrain. The prior infarct immediately to the right of the fourth ventricle is subtle with only modest decreased attenuation currently in this area. Elsewhere there is mild small vessel disease in the centra semiovale bilaterally. No acute appearing infarct  evident. Vascular: There is no hyperdense vessel. There is calcification in both carotid siphon regions. Skull: The bony calvarium appears intact. Other: Mastoids are essentially aplastic on the right, stable. Visualized mastoid air cells are clear. CT MAXILLOFACIAL FINDINGS Osseous: There is no acute fracture. There is evidence of an old fracture of the left orbital floor with remodeling in this area. No blastic or lytic bone lesions are evident. There is no frank dislocation. There does appear to be slight subluxation of the left mandibular condyle compared to the right. No mandibular condyle erosion on either side. Orbits: Orbits appear symmetric bilaterally. No intraorbital lesions are currently apparent. Sinuses: There is slight mucosal thickening in several ethmoid air cells bilaterally. Paranasal sinuses elsewhere are clear. No air-fluid level. No bony destruction or expansion. Ostiomeatal unit complexes are patent bilaterally. There is slight rightward deviation of the nasal septum. There is a concha bullosa on the left, an anatomic variant. There is mild edema of the inferior nasal turbinate on the left. No nares obstruction. Soft tissues: There is slight soft tissue swelling over the left mid upper face without well-defined hematoma. Salivary glands appear symmetric and normal bilaterally. No adenopathy. Visualized pharynx appears normal. Tongue and tongue base regions appear normal. CT CERVICAL SPINE FINDINGS Alignment: There is no spondylolisthesis. Skull base and vertebrae: The skull base and craniocervical junction regions appear normal. There is mild pannus posterior to the odontoid which is not causing significant impression on the craniocervical junction. There is no evident fracture. No blastic or lytic bone lesions are apparent. Soft tissues and spinal canal: Prevertebral soft tissues and predental space regions are normal. There are no paraspinous lesions. There is no cord or canal hematoma  demonstrated. No high-grade spinal stenosis. Disc levels: There is moderately severe disc space narrowing at C3-4, C4-5, C5-6 and C6-7. There are anterior osteophytes at all levels, most notably at C3, C4, and C5. There is multilevel facet hypertrophy. There is impression on the exiting nerve root at C3-4 on the right due to bony hypertrophy. Similar changes are noted to a slightly lesser extent at C4-5 bilaterally. There is exit foraminal narrowing impressing on the exiting nerve root on the left at C5-6. There are disc bulges at multiple levels but no frank disc extrusion. Upper chest: There is asymmetric apparent scarring  in the anterior left apex. Visualized lung apices elsewhere clear. Other: There is bilateral carotid artery calcification. IMPRESSION: CT head: Atrophy with prior small infarcts and mild periventricular small vessel disease. No intracranial mass, hemorrhage, or extra-axial fluid collection. No acute appearing infarct. Areas of arterial vascular calcification. CT maxillofacial: Areas of relatively mild soft tissue swelling. No acute fracture. Remodeling of the left orbital floor from prior fracture noted on 2010 study. Slight subluxation of the left mandibular condyle compared to the right side. Clinical examination advised in this regard. No frank dislocation. It should be noted that if there is concern for meniscal subluxation or dislocation series, nonemergent MRI would be the imaging study of choice to further assess in this regard. Slight ethmoid sinus mucosal thickening bilaterally. Other paranasal sinuses are clear. Ostiomeatal unit complexes are patent bilaterally. Slight rightward deviation nasal septum. No nares obstruction. No intraorbital lesions. CT cervical spine: No fracture or spondylolisthesis. Multifocal osteoarthritic change. Bilateral carotid artery calcification. Electronically Signed   By: Lowella Grip III M.D.   On: 06/13/2016 11:44   Ct Cervical Spine Wo  Contrast  Result Date: 06/13/2016 CLINICAL DATA:  Pain following fall EXAM: CT HEAD WITHOUT CONTRAST CT MAXILLOFACIAL WITHOUT CONTRAST CT CERVICAL SPINE WITHOUT CONTRAST TECHNIQUE: Multidetector CT imaging of the head, cervical spine, and maxillofacial structures were performed using the standard protocol without intravenous contrast. Multiplanar CT image reconstructions of the cervical spine and maxillofacial structures were also generated. COMPARISON:  Maxillofacial CT March 06, 2009; head CT Sep 04, 2015; brain MRI Sep 05, 2015 FINDINGS: CT HEAD FINDINGS Brain: There is mild diffuse atrophy. There is no intracranial mass, hemorrhage, extra-axial fluid collection, or midline shift. There is evidence of a prior infarct in the left midbrain. The prior infarct immediately to the right of the fourth ventricle is subtle with only modest decreased attenuation currently in this area. Elsewhere there is mild small vessel disease in the centra semiovale bilaterally. No acute appearing infarct evident. Vascular: There is no hyperdense vessel. There is calcification in both carotid siphon regions. Skull: The bony calvarium appears intact. Other: Mastoids are essentially aplastic on the right, stable. Visualized mastoid air cells are clear. CT MAXILLOFACIAL FINDINGS Osseous: There is no acute fracture. There is evidence of an old fracture of the left orbital floor with remodeling in this area. No blastic or lytic bone lesions are evident. There is no frank dislocation. There does appear to be slight subluxation of the left mandibular condyle compared to the right. No mandibular condyle erosion on either side. Orbits: Orbits appear symmetric bilaterally. No intraorbital lesions are currently apparent. Sinuses: There is slight mucosal thickening in several ethmoid air cells bilaterally. Paranasal sinuses elsewhere are clear. No air-fluid level. No bony destruction or expansion. Ostiomeatal unit complexes are patent  bilaterally. There is slight rightward deviation of the nasal septum. There is a concha bullosa on the left, an anatomic variant. There is mild edema of the inferior nasal turbinate on the left. No nares obstruction. Soft tissues: There is slight soft tissue swelling over the left mid upper face without well-defined hematoma. Salivary glands appear symmetric and normal bilaterally. No adenopathy. Visualized pharynx appears normal. Tongue and tongue base regions appear normal. CT CERVICAL SPINE FINDINGS Alignment: There is no spondylolisthesis. Skull base and vertebrae: The skull base and craniocervical junction regions appear normal. There is mild pannus posterior to the odontoid which is not causing significant impression on the craniocervical junction. There is no evident fracture. No blastic or lytic bone lesions  are apparent. Soft tissues and spinal canal: Prevertebral soft tissues and predental space regions are normal. There are no paraspinous lesions. There is no cord or canal hematoma demonstrated. No high-grade spinal stenosis. Disc levels: There is moderately severe disc space narrowing at C3-4, C4-5, C5-6 and C6-7. There are anterior osteophytes at all levels, most notably at C3, C4, and C5. There is multilevel facet hypertrophy. There is impression on the exiting nerve root at C3-4 on the right due to bony hypertrophy. Similar changes are noted to a slightly lesser extent at C4-5 bilaterally. There is exit foraminal narrowing impressing on the exiting nerve root on the left at C5-6. There are disc bulges at multiple levels but no frank disc extrusion. Upper chest: There is asymmetric apparent scarring in the anterior left apex. Visualized lung apices elsewhere clear. Other: There is bilateral carotid artery calcification. IMPRESSION: CT head: Atrophy with prior small infarcts and mild periventricular small vessel disease. No intracranial mass, hemorrhage, or extra-axial fluid collection. No acute  appearing infarct. Areas of arterial vascular calcification. CT maxillofacial: Areas of relatively mild soft tissue swelling. No acute fracture. Remodeling of the left orbital floor from prior fracture noted on 2010 study. Slight subluxation of the left mandibular condyle compared to the right side. Clinical examination advised in this regard. No frank dislocation. It should be noted that if there is concern for meniscal subluxation or dislocation series, nonemergent MRI would be the imaging study of choice to further assess in this regard. Slight ethmoid sinus mucosal thickening bilaterally. Other paranasal sinuses are clear. Ostiomeatal unit complexes are patent bilaterally. Slight rightward deviation nasal septum. No nares obstruction. No intraorbital lesions. CT cervical spine: No fracture or spondylolisthesis. Multifocal osteoarthritic change. Bilateral carotid artery calcification. Electronically Signed   By: Lowella Grip III M.D.   On: 06/13/2016 11:44   Ct Maxillofacial Wo Contrast  Result Date: 06/13/2016 CLINICAL DATA:  Pain following fall EXAM: CT HEAD WITHOUT CONTRAST CT MAXILLOFACIAL WITHOUT CONTRAST CT CERVICAL SPINE WITHOUT CONTRAST TECHNIQUE: Multidetector CT imaging of the head, cervical spine, and maxillofacial structures were performed using the standard protocol without intravenous contrast. Multiplanar CT image reconstructions of the cervical spine and maxillofacial structures were also generated. COMPARISON:  Maxillofacial CT March 06, 2009; head CT Sep 04, 2015; brain MRI Sep 05, 2015 FINDINGS: CT HEAD FINDINGS Brain: There is mild diffuse atrophy. There is no intracranial mass, hemorrhage, extra-axial fluid collection, or midline shift. There is evidence of a prior infarct in the left midbrain. The prior infarct immediately to the right of the fourth ventricle is subtle with only modest decreased attenuation currently in this area. Elsewhere there is mild small vessel disease in  the centra semiovale bilaterally. No acute appearing infarct evident. Vascular: There is no hyperdense vessel. There is calcification in both carotid siphon regions. Skull: The bony calvarium appears intact. Other: Mastoids are essentially aplastic on the right, stable. Visualized mastoid air cells are clear. CT MAXILLOFACIAL FINDINGS Osseous: There is no acute fracture. There is evidence of an old fracture of the left orbital floor with remodeling in this area. No blastic or lytic bone lesions are evident. There is no frank dislocation. There does appear to be slight subluxation of the left mandibular condyle compared to the right. No mandibular condyle erosion on either side. Orbits: Orbits appear symmetric bilaterally. No intraorbital lesions are currently apparent. Sinuses: There is slight mucosal thickening in several ethmoid air cells bilaterally. Paranasal sinuses elsewhere are clear. No air-fluid level. No bony destruction or  expansion. Ostiomeatal unit complexes are patent bilaterally. There is slight rightward deviation of the nasal septum. There is a concha bullosa on the left, an anatomic variant. There is mild edema of the inferior nasal turbinate on the left. No nares obstruction. Soft tissues: There is slight soft tissue swelling over the left mid upper face without well-defined hematoma. Salivary glands appear symmetric and normal bilaterally. No adenopathy. Visualized pharynx appears normal. Tongue and tongue base regions appear normal. CT CERVICAL SPINE FINDINGS Alignment: There is no spondylolisthesis. Skull base and vertebrae: The skull base and craniocervical junction regions appear normal. There is mild pannus posterior to the odontoid which is not causing significant impression on the craniocervical junction. There is no evident fracture. No blastic or lytic bone lesions are apparent. Soft tissues and spinal canal: Prevertebral soft tissues and predental space regions are normal. There are no  paraspinous lesions. There is no cord or canal hematoma demonstrated. No high-grade spinal stenosis. Disc levels: There is moderately severe disc space narrowing at C3-4, C4-5, C5-6 and C6-7. There are anterior osteophytes at all levels, most notably at C3, C4, and C5. There is multilevel facet hypertrophy. There is impression on the exiting nerve root at C3-4 on the right due to bony hypertrophy. Similar changes are noted to a slightly lesser extent at C4-5 bilaterally. There is exit foraminal narrowing impressing on the exiting nerve root on the left at C5-6. There are disc bulges at multiple levels but no frank disc extrusion. Upper chest: There is asymmetric apparent scarring in the anterior left apex. Visualized lung apices elsewhere clear. Other: There is bilateral carotid artery calcification. IMPRESSION: CT head: Atrophy with prior small infarcts and mild periventricular small vessel disease. No intracranial mass, hemorrhage, or extra-axial fluid collection. No acute appearing infarct. Areas of arterial vascular calcification. CT maxillofacial: Areas of relatively mild soft tissue swelling. No acute fracture. Remodeling of the left orbital floor from prior fracture noted on 2010 study. Slight subluxation of the left mandibular condyle compared to the right side. Clinical examination advised in this regard. No frank dislocation. It should be noted that if there is concern for meniscal subluxation or dislocation series, nonemergent MRI would be the imaging study of choice to further assess in this regard. Slight ethmoid sinus mucosal thickening bilaterally. Other paranasal sinuses are clear. Ostiomeatal unit complexes are patent bilaterally. Slight rightward deviation nasal septum. No nares obstruction. No intraorbital lesions. CT cervical spine: No fracture or spondylolisthesis. Multifocal osteoarthritic change. Bilateral carotid artery calcification. Electronically Signed   By: Lowella Grip III M.D.    On: 06/13/2016 11:44    ++++++++++++++++++++++++++++++++++++++++++++++++++++++++++++  Procedures .Marland KitchenLaceration Repair Performed by: Jola Schmidt Authorized by: Jola Schmidt    Consent: Verbal consent obtained. Risks and benefits: risks, benefits and alternatives were discussed Patient identity confirmed: provided demographic data Time out performed prior to procedure Prepped and Draped in normal sterile fashion Wound explored Laceration Location: Midline upper lip Laceration Length: 1cm No Foreign Bodies seen or palpated Anesthesia: Nerve Block (SEE NOTE) Irrigation method: syringe Amount of cleaning: standard Skin closure: 5-0 gut Number of sutures or staples: 2 Technique: Simple interrupted  Patient tolerance: Patient tolerated the procedure well with no immediate complications.    NERVE BLOCK Performed by: Hoy Morn Consent: Verbal consent obtained. Required items: required blood products, implants, devices, and special equipment available Time out: Immediately prior to procedure a "time out" was called to verify the correct patient, procedure, equipment, support staff and site/side marked as required. Indication: Upper lip  laceration  Nerve block body site: Bilateral infraorbital nerve  Preparation: Patient was prepped and draped in the usual sterile fashion. Needle gauge: 24 G Location technique: anatomical landmarks Local anesthetic: lidocaine Anesthetic total: 5 ml Outcome: pain improved Patient tolerance: Patient tolerated the procedure well with no immediate complications.  ++++++++++++++++++++++++++++++++++++++++++++++++++++++++++++++++   Medications Ordered in ED Medications  lidocaine (PF) (XYLOCAINE) 1 % injection 30 mL (30 mLs Infiltration Given by Other 06/13/16 1159)  cephALEXin (KEFLEX) capsule 500 mg (500 mg Oral Given 06/13/16 1501)     Initial Impression / Assessment and Plan / ED Course  I have reviewed the triage vital signs and the  nursing notes.  Pertinent labs & imaging results that were available during my care of the patient were reviewed by me and considered in my medical decision making (see chart for details).     Upper lip laceration.  No obvious pathology noted on CT imaging.  If patient continues to have mild mandibular pain she can follow-up with your nose and throat.  Infection warnings given.  Given time out from injury the patient be placed on antibiotics.  Patient and family understand increased risk of infection given delayed closure.  The wound was unable to be closed completely given the fact that there was some tissue loss as it turns out some family cut loose skin off with scissors.  Final Clinical Impressions(s) / ED Diagnoses   Final diagnoses:  Fall, initial encounter  Contusion of face, initial encounter  Injury of head, initial encounter  Lip laceration, initial encounter    New Prescriptions Discharge Medication List as of 06/13/2016  2:28 PM    START taking these medications   Details  cephALEXin (KEFLEX) 500 MG capsule Take 1 capsule (500 mg total) by mouth 3 (three) times daily., Starting Sun 06/13/2016, Print         Jola Schmidt, MD 06/15/16 [phone]

## 2016-06-17 ENCOUNTER — Other Ambulatory Visit: Payer: Self-pay | Admitting: Nephrology

## 2016-06-17 DIAGNOSIS — N184 Chronic kidney disease, stage 4 (severe): Secondary | ICD-10-CM

## 2016-06-17 DIAGNOSIS — I159 Secondary hypertension, unspecified: Secondary | ICD-10-CM

## 2016-06-21 ENCOUNTER — Other Ambulatory Visit: Payer: Medicare Other

## 2016-06-23 ENCOUNTER — Encounter (HOSPITAL_COMMUNITY)
Admission: RE | Admit: 2016-06-23 | Discharge: 2016-06-23 | Disposition: A | Discharge status: Home / Self Care | Payer: Medicare Other | Source: Ambulatory Visit | Attending: Nephrology | Admitting: Nephrology

## 2016-06-23 DIAGNOSIS — N189 Chronic kidney disease, unspecified: Secondary | ICD-10-CM | POA: Insufficient documentation

## 2016-06-23 DIAGNOSIS — D631 Anemia in chronic kidney disease: Secondary | ICD-10-CM | POA: Insufficient documentation

## 2016-06-23 MED ORDER — SODIUM CHLORIDE 0.9 % IV SOLN
510.0000 mg | Freq: Once | INTRAVENOUS | Status: AC
  Administered 2016-06-23: 510 mg via INTRAVENOUS
  Filled 2016-06-23: qty 17

## 2016-06-23 MED ORDER — SODIUM CHLORIDE 0.9 % IV SOLN
INTRAVENOUS | Status: DC
  Administered 2016-06-23: 250 mL via INTRAVENOUS

## 2016-06-25 ENCOUNTER — Ambulatory Visit
Admission: RE | Admit: 2016-06-25 | Discharge: 2016-06-25 | Disposition: A | Discharge status: Home / Self Care | Payer: Medicare Other | Source: Ambulatory Visit | Attending: Nephrology | Admitting: Nephrology

## 2016-06-25 DIAGNOSIS — I159 Secondary hypertension, unspecified: Secondary | ICD-10-CM

## 2016-06-25 DIAGNOSIS — N184 Chronic kidney disease, stage 4 (severe): Secondary | ICD-10-CM

## 2016-06-30 ENCOUNTER — Encounter (HOSPITAL_COMMUNITY): Payer: Self-pay

## 2016-06-30 ENCOUNTER — Encounter (HOSPITAL_COMMUNITY)
Admission: RE | Admit: 2016-06-30 | Discharge: 2016-06-30 | Disposition: A | Discharge status: Home / Self Care | Payer: Medicare Other | Source: Ambulatory Visit | Attending: Nephrology | Admitting: Nephrology

## 2016-06-30 DIAGNOSIS — I129 Hypertensive chronic kidney disease with stage 1 through stage 4 chronic kidney disease, or unspecified chronic kidney disease: Secondary | ICD-10-CM | POA: Diagnosis not present

## 2016-06-30 DIAGNOSIS — D631 Anemia in chronic kidney disease: Secondary | ICD-10-CM | POA: Insufficient documentation

## 2016-06-30 DIAGNOSIS — E1122 Type 2 diabetes mellitus with diabetic chronic kidney disease: Secondary | ICD-10-CM | POA: Diagnosis not present

## 2016-06-30 DIAGNOSIS — N184 Chronic kidney disease, stage 4 (severe): Secondary | ICD-10-CM | POA: Insufficient documentation

## 2016-06-30 MED ORDER — SODIUM CHLORIDE 0.9 % IV SOLN
510.0000 mg | Freq: Once | INTRAVENOUS | Status: AC
  Administered 2016-06-30: 510 mg via INTRAVENOUS
  Filled 2016-06-30: qty 17

## 2016-06-30 MED ORDER — SODIUM CHLORIDE 0.9 % IV SOLN
INTRAVENOUS | Status: DC
  Administered 2016-06-30: 1000 mL via INTRAVENOUS

## 2016-07-02 ENCOUNTER — Encounter: Payer: Self-pay | Admitting: Family Medicine

## 2016-07-28 ENCOUNTER — Ambulatory Visit: Payer: Medicare Other | Admitting: Nurse Practitioner

## 2016-07-29 ENCOUNTER — Encounter: Payer: Self-pay | Admitting: Nurse Practitioner

## 2016-08-02 ENCOUNTER — Encounter: Payer: Self-pay | Admitting: Nurse Practitioner

## 2016-08-02 ENCOUNTER — Ambulatory Visit (INDEPENDENT_AMBULATORY_CARE_PROVIDER_SITE_OTHER): Payer: Medicare (Managed Care) | Admitting: Nurse Practitioner

## 2016-08-02 VITALS — BP 150/70 | HR 60 | Wt 169.4 lb

## 2016-08-02 DIAGNOSIS — I69398 Other sequelae of cerebral infarction: Secondary | ICD-10-CM | POA: Diagnosis not present

## 2016-08-02 DIAGNOSIS — I10 Essential (primary) hypertension: Secondary | ICD-10-CM

## 2016-08-02 DIAGNOSIS — I639 Cerebral infarction, unspecified: Secondary | ICD-10-CM

## 2016-08-02 DIAGNOSIS — G8191 Hemiplegia, unspecified affecting right dominant side: Secondary | ICD-10-CM

## 2016-08-02 DIAGNOSIS — R269 Unspecified abnormalities of gait and mobility: Secondary | ICD-10-CM

## 2016-08-02 DIAGNOSIS — E782 Mixed hyperlipidemia: Secondary | ICD-10-CM

## 2016-08-02 NOTE — Patient Instructions (Signed)
Stressed the importance of management of risk factors to prevent further stroke Continue aspirin and Plavix for secondary stroke prevention Maintain strict control of hypertension with blood pressure goal below 130/90, today's reading 150/60 continue antihypertensive medications Control of diabetes with hemoglobin A1c below 6.5 followed by primary care  continue diabetic medications  Cholesterol with LDL cholesterol less than 70, followed by primary care,   continue statin drugs Pravachol Pt [facility]  Sleep study positive for OSA need follow up with Dr. Rexene Alberts Follow-up with me in 6 months make appt with Dr. Rexene Alberts in next 6 weeks

## 2016-08-02 NOTE — Progress Notes (Signed)
[facility]  PATIENT: Glenda [name] DOB: 31-Jan-1934   REASON FOR VISIT: Hospital follow-up for stroke  HISTORY FROM: Patient ,daughter Glenda [name]    HISTORY OF PRESENT ILLNESS:UPDATE 04/09/2018CM Glenda [name], 83 year old female returns for follow-up with her daughter. She continues to live at home but someone is with her TIME. She has a history of stroke in April 2017. She is currently on aspirin and Plavix without further stroke or TIA symptoms. She has minimal bruising or bleeding In addition she has hyperlipidemia, her Crestor was recently changed to Pravachol by her primary care provider. She denies any myalgias She claims her diabetes is up and down. Blood pressure office today 150/70. She has just [facility]. She was referred for sleep study by me at her last visit. Her sleep study completed severe obstructive sleep apnea. She claims she is on CPAP however she doesn't remember exactly when this started sometime in December or January. She has not followed back up with Dr. Rexene Alberts for compliance. 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 She ambulates with a walker. She returns for reevaluation HISTORY 12/08/15 [name] is a 83 y.o. female recently admitted to [facility] from 08/24/2015 until 08/28/2015 for evaluation of dizziness, nausea, vomiting, and gait instability. An MRI on 08/24/2015 revealed a punctate acute infarct in the right middle cerebellar peduncle. The patient was seen in consultation by Dr. Leonel Ramsay on 08/24/2015 and subsequently followed by Dr. Leonie Man. The infarct was felt secondary to small vessel disease. The patient had been on aspirin 325 mg daily with Plavix 75 mg daily in the past; however, when seen by Dr. Leonie Man the aspirin was discontinued. She was however discharged to a nursing facility for short-term rehabilitation on 08/27/2005 on aspirin 81 mg daily with Plavix 75 mg daily.   The patient was  readmitted 09/07/15  with nausea, vomiting, and dizziness. She was noted to have coffee-ground emesis. An MRI performed 09/05/2015 showed a mild acute extension of the lacunar type right cerebellar infarct. Neurology was asked to reconsult. MRA no high-grade stenosis. Echo ejection fraction 60-65% LDL 70 hemoglobin A1c 10.2 Recent coffee-ground emesis and antiplatelet was put on hold. She was discharged to a skilled nursing facility where she received some physical therapy. She is now back home and receiving outpatient rehabilitation physical therapy occupational therapy. Daughter lives with her. Patient uses a walker in the home and a wheelchair outside the home. No falls no further stroke or TIA symptoms. She does complain of daytime drowsiness and not feeling rested in the morning. She returns for reevaluation    REVIEW OF SYSTEMS: Full 14 system review of systems performed and notable only for those listed, all others are neg:  Constitutional: neg  Cardiovascular: neg Ear/Nose/Throat: neg  Skin: neg Eyes: neg Respiratory: neg Gastroitestinal: neg  Hematology/Lymphatic: neg  Endocrine: neg Musculoskeletal: Gait abnormality Allergy/Immunology: neg Neurological: Weakness Psychiatric: neg Sleep : Obstructive sleep apnea with CPAP ALLERGIES: No Known Allergies  HOME MEDICATIONS: Outpatient Medications Prior to Visit  Medication Sig Dispense Refill  . amLODipine (NORVASC) 10 MG tablet Take 10 mg by mouth daily. Reported on 10/31/2015    . aspirin 81 MG chewable tablet Chew 81 mg by mouth daily.    . cephALEXin (KEFLEX) 500 MG capsule Take 1 capsule (500 mg total) by mouth 3 (three) times daily. 21 capsule 0  . clonazePAM (KLONOPIN) 0.5 MG tablet Take 0.5 tablets (0.25 mg total) by mouth 3 (three) times daily as  needed for anxiety. 30 tablet 0  . cloNIDine (CATAPRES) 0.2 MG tablet Take 0.2 mg by mouth 2 (two) times daily as needed. BP > 160    . clopidogrel (PLAVIX) 75 MG tablet Take 75 mg  by mouth daily.     . furosemide (LASIX) 80 MG tablet Take 80 mg by mouth daily as needed.     Marland Kitchen HUMALOG KWIKPEN 100 UNIT/ML KiwkPen Inject 22-34 Units into the skin 2 (two) times daily. Sliding scale as follows: 80-199 = 22 units 200-299 = 26 units 300-399 = 30 units >400 = 34 units  3  . hyaluronate sodium (RADIAPLEXRX) GEL Apply 1 application topically once. Reported on 10/31/2015    . pantoprazole (PROTONIX) 40 MG tablet Take 40 mg by mouth 2 (two) times daily.    . potassium chloride (KLOR-CON) 10 MEQ CR tablet Take 10 mEq by mouth 2 (two) times daily. When taking lasix    . pravastatin (PRAVACHOL) 80 MG tablet Take 80 mg by mouth daily.    Nelva Nay SOLOSTAR 300 UNIT/ML SOPN Inject 24 Units into the skin every morning.   11  . valsartan-hydrochlorothiazide (DIOVAN-HCT) 320-25 MG tablet Take 1 tablet by mouth daily.    . nebivolol (BYSTOLIC) 10 MG tablet Take 20 mg by mouth daily.     No facility-administered medications prior to visit.     PAST MEDICAL HISTORY: Past Medical History:  Diagnosis Date  . Arthritis   . Breast cancer ([place])   . CAD (coronary artery disease)   . Chest pain   . Dizziness   . DM (diabetes mellitus) ([place])   . GERD (gastroesophageal reflux disease)   . HLD (hyperlipidemia)   . HTN (hypertension)   . Obesity   . Radiation 09/11/15-10/13/15   42.72 Gy to right breast, 12 Gy boost to right breast  . Shortness of breath dyspnea   . Stroke ([place])   . UTI (urinary tract infection)     PAST SURGICAL HISTORY: Past Surgical History:  Procedure Laterality Date  . CHOLECYSTECTOMY    . colon polyps; hx    . CORONARY ANGIOPLASTY  02/14/09  . ESOPHAGOGASTRODUODENOSCOPY (EGD) WITH PROPOFOL Left 09/08/2015   Procedure: ESOPHAGOGASTRODUODENOSCOPY (EGD) WITH PROPOFOL;  Surgeon: Clarene Essex, MD;  Location: [facility] ENDOSCOPY;  Service: Endoscopy;  Laterality: Left;  . RADIOACTIVE SEED GUIDED MASTECTOMY WITH AXILLARY SENTINEL LYMPH NODE BIOPSY Right 07/17/2015   Procedure:  RADIOACTIVE SEED GUIDED PARTIAL MASTECTOMY WITH AXILLARY SENTINEL LYMPH NODE BIOPSY;  Surgeon: Erroll Luna, MD;  Location: [facility];  Service: General;  Laterality: Right;    FAMILY HISTORY: Family History  Problem Relation Age of Onset  . Diabetes Mother   . Colon cancer Father     also had lung  . Diabetes    . Heart attack    . Diabetes Sister     [name]  . Breast cancer Sister     SOCIAL HISTORY: Social History   Social History  . Marital status: Widowed    Spouse name: N/A  . Number of children: 5  . Years of education: 12   Occupational History  . Retired    Social History Main Topics  . Smoking status: Never Smoker  . Smokeless tobacco: Never Used  . Alcohol use No  . Drug use: No  . Sexual activity: Not Currently   Other Topics Concern  . Not on file   Social History Narrative   Retired.    Lives with daughter   Caffeine use: Drinks  soda and coffee and tea daily     PHYSICAL EXAM  Vitals:   08/02/16 0921 08/02/16 0928  BP: (!) 150/70   Pulse: 61 60  Weight: 169 lb 6.4 oz (76.8 kg)    Body mass index is 27.34 kg/m.  Generalized: Well developed, in no acute distress , well-groomed Head: normocephalic and atraumatic,. Oropharynx benign  Neck: Supple, no carotid bruits  Cardiac: Regular rate rhythm, no murmur  Musculoskeletal: No deformity   Neurological examination   Mentation: Alert oriented to time, place, history taking. Attention span and concentration appropriate. Recent and remote memory intact.  Follows all commands speech and language fluent.   Cranial nerve II-XII: Pupils were equal round reactive to light extraocular movements were full, visual field were full on confrontational test. Facial sensation and strength were normal. hearing was intact to finger rubbing bilaterally. Uvula tongue midline. head turning and shoulder shrug were normal and symmetric.Tongue protrusion into cheek strength was normal. Motor: normal bulk and tone, full  strength in the BUE, BLE, except right upper extremity 4 out of 5 and poor grip. Sensory: normal and symmetric to light touch, pinprick, and  Vibration, in the upper and lower extremities, except mildly decreased pinprick right lower extremity and right upper extremity Coordination: finger-nose-finger, heel-to-shin bilaterally, dysmetria on the right Reflexes: Symmetric upper and lower, plantar responses were flexor bilaterally. Gait and Station: In wheelchair not ambulated due to safety concerns DIAGNOSTIC DATA (LABS, IMAGING, TESTING) - I reviewed patient records, labs, notes, testing and imaging myself where available.  Lab Results  Component Value Date   WBC 5.3 09/16/2015   HGB 11.2 (A) 09/16/2015   HCT 36 09/16/2015   MCV 82.1 09/09/2015   PLT 268 09/16/2015      Component Value Date/Time   NA 140 09/16/2015   NA 141 05/07/2015 1251   K 4.4 09/16/2015   K 4.3 05/07/2015 1251   CL 111 09/06/2015 0629   CO2 24 09/06/2015 0629   CO2 22 05/07/2015 1251   GLUCOSE 97 09/06/2015 0629   GLUCOSE 110 05/07/2015 1251   BUN 34 (A) 09/16/2015   BUN 22.5 05/07/2015 1251   CREATININE 1.7 (A) 09/16/2015   CREATININE 1.42 (H) 09/06/2015 0629   CREATININE 1.2 (H) 05/07/2015 1251   CALCIUM 9.0 09/06/2015 0629   CALCIUM 9.1 05/07/2015 1251   PROT 6.0 (L) 09/05/2015 0622   PROT 7.1 05/07/2015 1251   ALBUMIN 2.8 (L) 09/05/2015 0622   ALBUMIN 3.3 (L) 05/07/2015 1251   AST 17 09/16/2015   AST 15 05/07/2015 1251   ALT 3 (A) 09/16/2015   ALT <9 05/07/2015 1251   ALKPHOS 100 09/16/2015   ALKPHOS 95 05/07/2015 1251   BILITOT 0.6 09/05/2015 0622   BILITOT 0.30 05/07/2015 1251   GFRNONAA 34 (L) 09/06/2015 0629   GFRAA 40 (L) 09/06/2015 0629   Lab Results  Component Value Date   CHOL 151 08/25/2015   HDL 47 08/25/2015   LDLCALC 70 08/25/2015   TRIG 168 (H) 08/25/2015   CHOLHDL 3.2 08/25/2015   Lab Results  Component Value Date   HGBA1C 10.2 (H) 08/25/2015    Lab Results    Component Value Date   TSH 0.991 09/07/2015      ASSESSMENT AND PLAN  83 y.o. year old female  has a past medical history of Arthritis;  CAD (coronary artery disease); Chest pain; Dizziness; DM (diabetes mellitus) ([place]);  HLD (hyperlipidemia); HTN (hypertension); Obesity;  Shortness of breath dyspnea; Stroke [facility]); Here to  follow-up for her stroke which occurred in April 2017. The patient is a current patient of Dr. Erlinda Hong  who is out of the office today . This note is sent to the work in doctor.     PLAN: Stressed the importance of management of risk factors to prevent further stroke Continue aspirin and Plavix for secondary stroke prevention Maintain strict control of hypertension with blood pressure goal below 130/90, today's reading 150/60 continue antihypertensive medications Control of diabetes with hemoglobin A1c below 6.5 followed by primary care  continue diabetic medications  Cholesterol with LDL cholesterol less than 70, followed by primary care,   continue statin drugs Pravachol [facility]  Sleep study positive for OSA need follow up with Dr. Rexene Alberts for compliance Follow-up with me in 6 months make appt with Dr. Rexene Alberts in next 6 weeks I spent 25 min  in total face to face time with the patient more than 50% of which was spent counseling and coordination of care, reviewing test results reviewing medications and discussing and reviewing the diagnosis of stroke and the importance of management of risk factors. Dennie Bible, [facility], [facility], APRN  [facility] [address], [place], [place] [address] [phone]

## 2016-08-02 NOTE — Progress Notes (Signed)
I agree with the assessment and plan as directed by NP .The patient is known to me .   Francella Barnett, MD  

## 2016-09-16 DIAGNOSIS — R6889 Other general symptoms and signs: Secondary | ICD-10-CM | POA: Insufficient documentation

## 2016-09-17 ENCOUNTER — Ambulatory Visit: Payer: Medicare (Managed Care)

## 2016-09-17 DIAGNOSIS — R6889 Other general symptoms and signs: Secondary | ICD-10-CM

## 2016-10-18 ENCOUNTER — Ambulatory Visit: Payer: Medicare (Managed Care) | Admitting: Neurology

## 2016-11-08 ENCOUNTER — Encounter: Payer: Self-pay | Admitting: Neurology

## 2016-11-08 ENCOUNTER — Ambulatory Visit (INDEPENDENT_AMBULATORY_CARE_PROVIDER_SITE_OTHER): Payer: Medicare (Managed Care) | Admitting: Neurology

## 2016-11-08 VITALS — BP 149/73 | HR 55

## 2016-11-08 DIAGNOSIS — G4733 Obstructive sleep apnea (adult) (pediatric): Secondary | ICD-10-CM | POA: Diagnosis not present

## 2016-11-08 DIAGNOSIS — Z789 Other specified health status: Secondary | ICD-10-CM | POA: Diagnosis not present

## 2016-11-08 NOTE — Patient Instructions (Addendum)
Please call your DME company, [employer], for help with your mask issues.  Please continue using your CPAP regularly. While your insurance requires that you use CPAP at least 4 hours each night on 70% of the nights, I recommend, that you not skip any nights and use it throughout the night if you can. Getting used to CPAP and staying with the treatment long term does take time and patience and discipline. Untreated obstructive sleep apnea when it is moderate to severe can have an adverse impact on cardiovascular health and raise her risk for heart disease, arrhythmias, hypertension, congestive heart failure, stroke and diabetes. Untreated obstructive sleep apnea causes sleep disruption, nonrestorative sleep, and sleep deprivation. This can have an impact on your day to day functioning and cause daytime sleepiness and impairment of cognitive function, memory loss, mood disturbance, and problems focussing. Using CPAP regularly can improve these symptoms.  Especially, given your medical history and your stroke, it is very important to get you back on CPAP.  Please take your furosemide as needed during the day, not at night.  We can see you in 3 months, please see Cecille Rubin, nurse practitioner.

## 2016-11-08 NOTE — Progress Notes (Signed)
Subjective:    Patient ID: Glenda [name] is a 83 y.o. female.  HPI     Interim history:   Glenda [name] is a 83 year old right-handed woman with an underlying medical history of arthritis, breast cancer, status post right mastectomy in March 2017, and radiation therapy in May and June 2017, coronary artery disease, status post angioplasty in 2010, type 2 diabetes, reflux disease, hyperlipidemia, hypertension, overweight state, and ischemic stroke of the right cerebellar peduncle in April 2017, who presents for follow-up consultation of her obstructive sleep apnea, after sleep study testing in October 2017. The patient is accompanied by her daughter today. I first met her on 12/23/2015, at which time she was referred by Glenda [name] and Glenda [name], and she reported snoring and daytime somnolence. I invited her for a sleep study. She had a split-night sleep study on 02/03/2016. Baseline sleep efficiency was 90.2%, sleep latency 2 minutes and REM sleep was absent. She had an increased percentage of slow-wave sleep. Total AHI was 39.7 per hour, average oxygen saturation 93%, nadir was 86% during non-REM sleep. She had no significant PLMS. She was titrated on CPAP via medium full face mask. CPAP was titrated from 5 cm to 15 cm. On a pressure of 13 cm her AHI was 1.6 per hour, supine REM sleep achieved, O2 nadir of 92%. She achieved a significant amount of REM sleep at 29.5%, in keeping with REM rebound. Based on her test results I prescribed CPAP therapy for home use.  Today, 11/08/2016 (all dictated new, as well as above notes, some dictation done in note pad or Word, outside of chart, may appear as copied):   I reviewed her CPAP compliance data from 10/05/2016 through 11/03/2016 which is a total of 30 days, during which time she used her CPAP only 10 days with percent used days greater than 4 hours and 30%, indicating poor compliance with an average usage of 7 hours and 27 minutes 4 days on treatment, AHI  0.6 per hour, leak high with the 95th percentile at 33.5 L/m on a pressure of 13 cm with EPR of 2. In the past 90 days, her compliance percentage was a little better at 42%. She reports Difficulty with her mask. She has trouble with mask fit and also had broken out in her face, perhaps from tightening the mask too much. She has not been in touch with her DME company. She is willing to get back on CPAP therapy. She is motivated to continue treatment. Of note, she does have sleep disruption at night, often has to go to the bathroom, when she takes her Lasix which is as needed, she takes it in the evening. She feels that it works better at night. She does not take it every day, she does take her blood pressure medicine daily which also contains hydrochlorothiazide, she takes that in the mornings.  The patient's allergies, current medications, family history, past medical history, past social history, past surgical history and problem list were reviewed and updated as appropriate.   Previously (copied from previous notes for reference):   12/23/2015: (She) reports snoring and excessive daytime somnolence. I reviewed your office note from 12/08/2015. Her Epworth Sleepiness Scale score is 9 out of 24 today, her fatigue score is 27 out of 63 today. She is noted to take naps. She denies morning headaches, nocturia on a night to night basis and denies RLS symptoms.  She is a nonsmoker, she does not drink alcohol, she drinks caffeine occasionally, she  tries to drink enough water. She mobilizes with a walker. She does admit that she had a fall a little over a week ago and landed on her buttocks, no injuries reported. When she does have to get up to use the bathroom at night, she usually does this herself with the use of her walker. Bedtime is around 9 PM and wake up time is around 9 AM. She feels that she would be able to stay for a sleep study by herself. She has 1 son and 4 daughters, she lives with her daughter  who is here with her today, Glenda [name]. Daughter works third shift and when she is away at work another daughter comes and stays with the patient.  Her Past Medical History Is Significant For: Past Medical History:  Diagnosis Date  . Arthritis   . Breast cancer ([place])   . CAD (coronary artery disease)   . Chest pain   . Dizziness   . DM (diabetes mellitus) ([place])   . GERD (gastroesophageal reflux disease)   . HLD (hyperlipidemia)   . HTN (hypertension)   . Obesity   . Radiation 09/11/15-10/13/15   42.72 Gy to right breast, 12 Gy boost to right breast  . Shortness of breath dyspnea   . Stroke ([place])   . UTI (urinary tract infection)     Her Past Surgical History Is Significant For: Past Surgical History:  Procedure Laterality Date  . CHOLECYSTECTOMY    . colon polyps; hx    . CORONARY ANGIOPLASTY  02/14/09  . ESOPHAGOGASTRODUODENOSCOPY (EGD) WITH PROPOFOL Left 09/08/2015   Procedure: ESOPHAGOGASTRODUODENOSCOPY (EGD) WITH PROPOFOL;  Surgeon: Clarene Essex, MD;  Location: [facility] ENDOSCOPY;  Service: Endoscopy;  Laterality: Left;  . RADIOACTIVE SEED GUIDED MASTECTOMY WITH AXILLARY SENTINEL LYMPH NODE BIOPSY Right 07/17/2015   Procedure: RADIOACTIVE SEED GUIDED PARTIAL MASTECTOMY WITH AXILLARY SENTINEL LYMPH NODE BIOPSY;  Surgeon: Erroll Luna, MD;  Location: [facility];  Service: General;  Laterality: Right;    Her Family History Is Significant For: Family History  Problem Relation Age of Onset  . Diabetes Mother   . Colon cancer Father        also had lung  . Diabetes Unknown   . Heart attack Unknown   . Diabetes Sister        [name]  . Breast cancer Sister     Her Social History Is Significant For: Social History   Social History  . Marital status: Widowed    Spouse name: N/A  . Number of children: 5  . Years of education: 12   Occupational History  . Retired    Social History Main Topics  . Smoking status: Never Smoker  . Smokeless tobacco: Never Used  . Alcohol use No  . Drug  use: No  . Sexual activity: Not Currently   Other Topics Concern  . None   Social History Narrative   Retired.    Lives with daughter   Caffeine use: Drinks soda and coffee and tea daily    Her Allergies Are:  No Known Allergies:   Her Current Medications Are:  Outpatient Encounter Prescriptions as of 11/08/2016  Medication Sig  . amLODipine (NORVASC) 10 MG tablet Take 10 mg by mouth daily. Reported on 10/31/2015  . aspirin 81 MG chewable tablet Chew 81 mg by mouth daily.  Marland Kitchen BYSTOLIC 20 MG TABS 20 mg daily.  . cephALEXin (KEFLEX) 500 MG capsule Take 1 capsule (500 mg total) by mouth 3 (three) times daily.  Marland Kitchen  clonazePAM (KLONOPIN) 0.5 MG tablet Take 0.5 tablets (0.25 mg total) by mouth 3 (three) times daily as needed for anxiety.  . cloNIDine (CATAPRES) 0.2 MG tablet Take 0.2 mg by mouth 2 (two) times daily as needed. BP > 160  . clopidogrel (PLAVIX) 75 MG tablet Take 75 mg by mouth daily.   . furosemide (LASIX) 80 MG tablet Take 80 mg by mouth daily as needed.   Marland Kitchen HUMALOG KWIKPEN 100 UNIT/ML KiwkPen Inject 22-34 Units into the skin 2 (two) times daily. Sliding scale as follows: 80-199 = 22 units 200-299 = 26 units 300-399 = 30 units >400 = 34 units  . hyaluronate sodium (RADIAPLEXRX) GEL Apply 1 application topically once. Reported on 10/31/2015  . pantoprazole (PROTONIX) 40 MG tablet Take 40 mg by mouth 2 (two) times daily.  . potassium chloride (KLOR-CON) 10 MEQ CR tablet Take 10 mEq by mouth 2 (two) times daily. When taking lasix  . pravastatin (PRAVACHOL) 80 MG tablet Take 80 mg by mouth daily.  Nelva Nay SOLOSTAR 300 UNIT/ML SOPN Inject 24 Units into the skin every morning.   . valsartan-hydrochlorothiazide (DIOVAN-HCT) 320-25 MG tablet Take 1 tablet by mouth daily.   No facility-administered encounter medications on file as of 11/08/2016.   :  Review of Systems:  Out of a complete 14 point review of systems, all are reviewed and negative with the exception of these symptoms  as listed below: Review of Systems  Neurological:       Pt presents today to discuss her cpap. Pt says that the cpap mask is causing a facial rash.    Objective:  Neurological Exam  Physical Exam Physical Examination:   Vitals:   11/08/16 1125  BP: (!) 149/73  Pulse: (!) 55   General Examination: The patient is a very pleasant 83 y.o. female in no acute distress. She appears well-developed and well-nourished and well groomed. In [place].   HEENT: Normocephalic, atraumatic, pupils are equal, round and reactive to light and accommodation. Extraocular tracking is good without limitation to gaze excursion or nystagmus noted. Normal smooth pursuit is noted. Hearing is mildly impaired. Face is fairly symmetric with normal facial animation and normal facial sensation. Speech is dysarthric, seems stable or slightly better. There is no lip, neck/head, jaw or voice tremor. Neck is supple with full range of passive and active motion. There are no carotid bruits on auscultation. Oropharynx exam reveals: mild mouth dryness, adequate dental hygiene with dentures on top, and moderate airway crowding. Tonsils are absent. Mallampati is class III. Tongue protrudes centrally and palate elevates symmetrically.   Chest: Clear to auscultation without wheezing, rhonchi or crackles noted.  Heart: S1+S2+0, regular and normal without murmurs, rubs or gallops noted.   Abdomen: Soft, non-tender and non-distended with normal bowel sounds appreciated on auscultation.  Extremities: There is 1+ pitting edema in the distal lower extremities bilaterally. Pedal pulses are intact.  Skin: Warm and dry without trophic changes noted. There are no varicose veins.  Musculoskeletal: exam reveals no obvious joint deformities, tenderness or joint swelling or erythema.   Neurologically:  Mental status: The patient is awake, alert and oriented in all 4 spheres. Her immediate and remote memory, attention, language skills and  fund of knowledge are appropriate. There is no evidence of aphasia, agnosia, apraxia or anomia. Speech is dysarthric. Mood is normal and affect is normal.  Cranial nerves II - XII are as described above under HEENT exam. In addition: shoulder shrug is normal with equal shoulder height  noted. Motor exam: Normal bulk, strength and tone is noted. There is no drift, tremor or rebound. Reflexes are 1+ throughout, trace in the ankles, 2+ in the right upper extremity. Fine motor skills and coordination: Impaired on the right side. She has mild dysmetria in the right upper extremity. She has no significant intention tremor. Sensory exam is intact to light touch throughout. She did not bring her walker, thus, I did not ask her to stand or walk for me. In [place].  Assessment and Plan:  In summary, Rand Boller is a very pleasant 83 year old female with an underlying medical history of arthritis, breast cancer, status post right mastectomy in March 2017, and radiation therapy in May and June 2017, coronary artery disease, status post angioplasty in 2010, type 2 diabetes, reflux disease, hyperlipidemia, hypertension, overweight state, and ischemic stroke of the right cerebellar peduncle in April 2017, whoPresents for follow-up consultation of her severe OSA. She had a split-night sleep study on 02/03/2016. We talked about her test results in detail today. We also reviewed her compliance data. She is struggling with her CPAP mask primarily. She is using a fullface mask. She does not have any obvious facial rash but has not been using her CPAP regularly recently. She is encouraged to try to get back on track, we will request a mask refit with her DME company. She is motivated to continue treatment. She is advised to follow-up in about 3 months with our nurse practitioner, Glenda [name]. She is commended for her effort to use CPAP. She is furthermore advised to avoid taking her diuretic at night which will cause sleep  disruption secondary to nocturia. She is agreeable to this. I answered all their questions today and the patient and her daughter were in agreement.  I spent 25 minutes in total face-to-face time with the patient, more than 50% of which was spent in counseling and coordination of care, reviewing test results, reviewing medication and discussing or reviewing the diagnosis of OSA, its prognosis and treatment options. Pertinent laboratory and imaging test results that were available during this visit with the patient were reviewed by me and considered in my medical decision making (see chart for details).

## 2016-11-26 ENCOUNTER — Other Ambulatory Visit: Payer: Self-pay | Admitting: Internal Medicine

## 2016-11-26 DIAGNOSIS — Z9889 Other specified postprocedural states: Secondary | ICD-10-CM

## 2017-01-02 ENCOUNTER — Emergency Department (HOSPITAL_COMMUNITY): Payer: Medicare (Managed Care)

## 2017-01-02 ENCOUNTER — Encounter (HOSPITAL_COMMUNITY): Payer: Self-pay | Admitting: *Deleted

## 2017-01-02 ENCOUNTER — Inpatient Hospital Stay (HOSPITAL_COMMUNITY)
Admission: EM | Admit: 2017-01-02 | Discharge: 2017-01-06 | DRG: 291 | Disposition: A | Discharge status: Home / Self Care | Payer: Medicare (Managed Care) | Attending: Internal Medicine | Admitting: Internal Medicine

## 2017-01-02 DIAGNOSIS — Z79899 Other long term (current) drug therapy: Secondary | ICD-10-CM | POA: Diagnosis not present

## 2017-01-02 DIAGNOSIS — N189 Chronic kidney disease, unspecified: Secondary | ICD-10-CM

## 2017-01-02 DIAGNOSIS — Z794 Long term (current) use of insulin: Secondary | ICD-10-CM | POA: Diagnosis not present

## 2017-01-02 DIAGNOSIS — I509 Heart failure, unspecified: Secondary | ICD-10-CM | POA: Diagnosis not present

## 2017-01-02 DIAGNOSIS — N184 Chronic kidney disease, stage 4 (severe): Secondary | ICD-10-CM | POA: Diagnosis not present

## 2017-01-02 DIAGNOSIS — E119 Type 2 diabetes mellitus without complications: Secondary | ICD-10-CM | POA: Diagnosis not present

## 2017-01-02 DIAGNOSIS — K219 Gastro-esophageal reflux disease without esophagitis: Secondary | ICD-10-CM | POA: Diagnosis not present

## 2017-01-02 DIAGNOSIS — Z7902 Long term (current) use of antithrombotics/antiplatelets: Secondary | ICD-10-CM

## 2017-01-02 DIAGNOSIS — I130 Hypertensive heart and chronic kidney disease with heart failure and stage 1 through stage 4 chronic kidney disease, or unspecified chronic kidney disease: Secondary | ICD-10-CM | POA: Diagnosis not present

## 2017-01-02 DIAGNOSIS — I340 Nonrheumatic mitral (valve) insufficiency: Secondary | ICD-10-CM | POA: Diagnosis not present

## 2017-01-02 DIAGNOSIS — Z8673 Personal history of transient ischemic attack (TIA), and cerebral infarction without residual deficits: Secondary | ICD-10-CM

## 2017-01-02 DIAGNOSIS — D649 Anemia, unspecified: Secondary | ICD-10-CM | POA: Diagnosis present

## 2017-01-02 DIAGNOSIS — I5031 Acute diastolic (congestive) heart failure: Secondary | ICD-10-CM | POA: Diagnosis not present

## 2017-01-02 DIAGNOSIS — I5033 Acute on chronic diastolic (congestive) heart failure: Secondary | ICD-10-CM | POA: Diagnosis not present

## 2017-01-02 DIAGNOSIS — E1122 Type 2 diabetes mellitus with diabetic chronic kidney disease: Secondary | ICD-10-CM | POA: Diagnosis not present

## 2017-01-02 DIAGNOSIS — Z923 Personal history of irradiation: Secondary | ICD-10-CM

## 2017-01-02 DIAGNOSIS — Z9011 Acquired absence of right breast and nipple: Secondary | ICD-10-CM

## 2017-01-02 DIAGNOSIS — E11649 Type 2 diabetes mellitus with hypoglycemia without coma: Secondary | ICD-10-CM | POA: Diagnosis present

## 2017-01-02 DIAGNOSIS — Z853 Personal history of malignant neoplasm of breast: Secondary | ICD-10-CM

## 2017-01-02 DIAGNOSIS — Z7982 Long term (current) use of aspirin: Secondary | ICD-10-CM

## 2017-01-02 DIAGNOSIS — C50411 Malignant neoplasm of upper-outer quadrant of right female breast: Secondary | ICD-10-CM | POA: Diagnosis present

## 2017-01-02 DIAGNOSIS — N179 Acute kidney failure, unspecified: Secondary | ICD-10-CM | POA: Diagnosis present

## 2017-01-02 DIAGNOSIS — Z9861 Coronary angioplasty status: Secondary | ICD-10-CM | POA: Diagnosis not present

## 2017-01-02 DIAGNOSIS — Z23 Encounter for immunization: Secondary | ICD-10-CM

## 2017-01-02 DIAGNOSIS — I2510 Atherosclerotic heart disease of native coronary artery without angina pectoris: Secondary | ICD-10-CM | POA: Diagnosis not present

## 2017-01-02 DIAGNOSIS — E785 Hyperlipidemia, unspecified: Secondary | ICD-10-CM | POA: Diagnosis not present

## 2017-01-02 DIAGNOSIS — I10 Essential (primary) hypertension: Secondary | ICD-10-CM | POA: Diagnosis not present

## 2017-01-02 DIAGNOSIS — J9601 Acute respiratory failure with hypoxia: Secondary | ICD-10-CM | POA: Diagnosis not present

## 2017-01-02 LAB — CBC WITH DIFFERENTIAL/PLATELET
BASOS ABS: 0 10*3/uL (ref 0.0–0.1)
BASOS PCT: 0 %
Eosinophils Absolute: 0.2 10*3/uL (ref 0.0–0.7)
Eosinophils Relative: 2 %
HEMATOCRIT: 30 % — AB (ref 36.0–46.0)
HEMOGLOBIN: 9.6 g/dL — AB (ref 12.0–15.0)
LYMPHS PCT: 12 %
Lymphs Abs: 0.9 10*3/uL (ref 0.7–4.0)
MCH: 27.9 pg (ref 26.0–34.0)
MCHC: 32 g/dL (ref 30.0–36.0)
MCV: 87.2 fL (ref 78.0–100.0)
Monocytes Absolute: 0.6 10*3/uL (ref 0.1–1.0)
Monocytes Relative: 8 %
NEUTROS ABS: 6 10*3/uL (ref 1.7–7.7)
NEUTROS PCT: 78 %
Platelets: 238 10*3/uL (ref 150–400)
RBC: 3.44 MIL/uL — ABNORMAL LOW (ref 3.87–5.11)
RDW: 13.9 % (ref 11.5–15.5)
WBC: 7.7 10*3/uL (ref 4.0–10.5)

## 2017-01-02 LAB — TROPONIN I
TROPONIN I: 0.03 ng/mL — AB (ref <0.03)
Troponin I: 0.03 ng/mL (ref <0.03)
Troponin I: 0.03 ng/mL (ref <0.03)

## 2017-01-02 LAB — COMPREHENSIVE METABOLIC PANEL
ALBUMIN: 3.3 g/dL — AB (ref 3.5–5.0)
ALT: 15 U/L (ref 14–54)
AST: 35 U/L (ref 15–41)
Alkaline Phosphatase: 124 U/L (ref 38–126)
Anion gap: 9 (ref 5–15)
BUN: 34 mg/dL — AB (ref 6–20)
CHLORIDE: 105 mmol/L (ref 101–111)
CO2: 26 mmol/L (ref 22–32)
Calcium: 8.3 mg/dL — ABNORMAL LOW (ref 8.9–10.3)
Creatinine, Ser: 2.42 mg/dL — ABNORMAL HIGH (ref 0.44–1.00)
GFR calc Af Amer: 21 mL/min — ABNORMAL LOW (ref >60)
GFR, EST NON AFRICAN AMERICAN: 18 mL/min — AB (ref >60)
Glucose, Bld: 129 mg/dL — ABNORMAL HIGH (ref 65–99)
POTASSIUM: 3.4 mmol/L — AB (ref 3.5–5.1)
Sodium: 140 mmol/L (ref 135–145)
Total Bilirubin: 0.6 mg/dL (ref 0.3–1.2)
Total Protein: 7.4 g/dL (ref 6.5–8.1)

## 2017-01-02 LAB — BRAIN NATRIURETIC PEPTIDE: B NATRIURETIC PEPTIDE 5: 685.4 pg/mL — AB (ref 0.0–100.0)

## 2017-01-02 LAB — TSH: TSH: 1.145 u[IU]/mL (ref 0.350–4.500)

## 2017-01-02 MED ORDER — CLONIDINE HCL 0.1 MG PO TABS
0.2000 mg | ORAL_TABLET | Freq: Two times a day (BID) | ORAL | Status: DC
  Administered 2017-01-02 – 2017-01-06 (×9): 0.2 mg via ORAL
  Filled 2017-01-02 (×9): qty 2

## 2017-01-02 MED ORDER — ACETAMINOPHEN 325 MG PO TABS: 650.0000 mg | ORAL_TABLET | ORAL | Status: DC | PRN

## 2017-01-02 MED ORDER — ONDANSETRON HCL 4 MG/2ML IJ SOLN: 4.0000 mg | Freq: Four times a day (QID) | INTRAMUSCULAR | Status: DC | PRN

## 2017-01-02 MED ORDER — HYDRALAZINE HCL 20 MG/ML IJ SOLN
20.0000 mg | INTRAMUSCULAR | Status: DC | PRN
  Administered 2017-01-02 – 2017-01-05 (×3): 20 mg via INTRAVENOUS
  Filled 2017-01-02 (×5): qty 1

## 2017-01-02 MED ORDER — ENOXAPARIN SODIUM 30 MG/0.3ML ~~LOC~~ SOLN: 30.0000 mg | SUBCUTANEOUS | Status: DC

## 2017-01-02 MED ORDER — SODIUM CHLORIDE 0.9% FLUSH: 3.0000 mL | INTRAVENOUS | Status: DC | PRN

## 2017-01-02 MED ORDER — INSULIN LISPRO 100 UNIT/ML (KWIKPEN): 20.0000 [IU] | PEN_INJECTOR | Freq: Two times a day (BID) | SUBCUTANEOUS | Status: DC

## 2017-01-02 MED ORDER — SODIUM CHLORIDE 0.9% FLUSH
3.0000 mL | Freq: Two times a day (BID) | INTRAVENOUS | Status: DC
  Administered 2017-01-02 – 2017-01-05 (×7): 3 mL via INTRAVENOUS

## 2017-01-02 MED ORDER — PNEUMOCOCCAL VAC POLYVALENT 25 MCG/0.5ML IJ INJ
0.5000 mL | INJECTION | INTRAMUSCULAR | Status: AC
  Administered 2017-01-03: 0.5 mL via INTRAMUSCULAR
  Filled 2017-01-02: qty 0.5

## 2017-01-02 MED ORDER — NEBIVOLOL HCL 10 MG PO TABS
20.0000 mg | ORAL_TABLET | Freq: Every day | ORAL | Status: DC
  Administered 2017-01-02 – 2017-01-03 (×2): 20 mg via ORAL
  Filled 2017-01-02 (×2): qty 2

## 2017-01-02 MED ORDER — POTASSIUM CHLORIDE CRYS ER 20 MEQ PO TBCR
40.0000 meq | EXTENDED_RELEASE_TABLET | Freq: Once | ORAL | Status: AC
  Administered 2017-01-02: 40 meq via ORAL
  Filled 2017-01-02: qty 2

## 2017-01-02 MED ORDER — PRAVASTATIN SODIUM 40 MG PO TABS
80.0000 mg | ORAL_TABLET | Freq: Every day | ORAL | Status: DC
  Administered 2017-01-02 – 2017-01-06 (×5): 80 mg via ORAL
  Filled 2017-01-02 (×5): qty 2

## 2017-01-02 MED ORDER — INSULIN ASPART 100 UNIT/ML ~~LOC~~ SOLN: 0.0000 [IU] | Freq: Every day | SUBCUTANEOUS | Status: DC

## 2017-01-02 MED ORDER — FUROSEMIDE 10 MG/ML IJ SOLN
60.0000 mg | Freq: Once | INTRAMUSCULAR | Status: AC
  Administered 2017-01-02: 60 mg via INTRAVENOUS
  Filled 2017-01-02: qty 6

## 2017-01-02 MED ORDER — FUROSEMIDE 10 MG/ML IJ SOLN
40.0000 mg | Freq: Once | INTRAMUSCULAR | Status: AC
  Administered 2017-01-02: 40 mg via INTRAVENOUS
  Filled 2017-01-02: qty 4

## 2017-01-02 MED ORDER — ASPIRIN 81 MG PO CHEW
81.0000 mg | CHEWABLE_TABLET | Freq: Every day | ORAL | Status: DC
  Administered 2017-01-02 – 2017-01-06 (×5): 81 mg via ORAL
  Filled 2017-01-02 (×5): qty 1

## 2017-01-02 MED ORDER — IPRATROPIUM-ALBUTEROL 0.5-2.5 (3) MG/3ML IN SOLN
3.0000 mL | Freq: Once | RESPIRATORY_TRACT | Status: AC
  Administered 2017-01-02: 3 mL via RESPIRATORY_TRACT
  Filled 2017-01-02: qty 3

## 2017-01-02 MED ORDER — HYDRALAZINE HCL 50 MG PO TABS
50.0000 mg | ORAL_TABLET | Freq: Three times a day (TID) | ORAL | Status: DC
  Administered 2017-01-02 – 2017-01-04 (×6): 50 mg via ORAL
  Filled 2017-01-02 (×6): qty 1

## 2017-01-02 MED ORDER — CITALOPRAM HYDROBROMIDE 10 MG PO TABS
20.0000 mg | ORAL_TABLET | Freq: Every day | ORAL | Status: DC
  Administered 2017-01-02 – 2017-01-06 (×5): 20 mg via ORAL
  Filled 2017-01-02 (×5): qty 2

## 2017-01-02 MED ORDER — ALBUTEROL SULFATE (2.5 MG/3ML) 0.083% IN NEBU
2.5000 mg | INHALATION_SOLUTION | RESPIRATORY_TRACT | Status: DC | PRN
  Administered 2017-01-02: 2.5 mg via RESPIRATORY_TRACT
  Filled 2017-01-02: qty 3

## 2017-01-02 MED ORDER — INSULIN ASPART 100 UNIT/ML ~~LOC~~ SOLN
0.0000 [IU] | Freq: Three times a day (TID) | SUBCUTANEOUS | Status: DC
Start: 2017-01-02 — End: 2017-01-06
  Administered 2017-01-02: 5 [IU] via SUBCUTANEOUS
  Administered 2017-01-03: 3 [IU] via SUBCUTANEOUS
  Administered 2017-01-03: 5 [IU] via SUBCUTANEOUS
  Administered 2017-01-03 – 2017-01-04 (×2): 3 [IU] via SUBCUTANEOUS
  Administered 2017-01-04: 5 [IU] via SUBCUTANEOUS
  Administered 2017-01-05: 2 [IU] via SUBCUTANEOUS
  Administered 2017-01-05 (×2): 3 [IU] via SUBCUTANEOUS
  Administered 2017-01-06: 11 [IU] via SUBCUTANEOUS

## 2017-01-02 MED ORDER — CLOPIDOGREL BISULFATE 75 MG PO TABS
75.0000 mg | ORAL_TABLET | Freq: Every day | ORAL | Status: DC
  Administered 2017-01-02 – 2017-01-06 (×5): 75 mg via ORAL
  Filled 2017-01-02 (×5): qty 1

## 2017-01-02 MED ORDER — INSULIN ASPART PROT & ASPART (70-30 MIX) 100 UNIT/ML ~~LOC~~ SUSP
10.0000 [IU] | Freq: Two times a day (BID) | SUBCUTANEOUS | Status: DC
  Administered 2017-01-02 – 2017-01-03 (×2): 10 [IU] via SUBCUTANEOUS
  Filled 2017-01-02: qty 10

## 2017-01-02 MED ORDER — SODIUM CHLORIDE 0.9 % IV SOLN: 250.0000 mL | INTRAVENOUS | Status: DC | PRN

## 2017-01-02 MED ORDER — FUROSEMIDE 10 MG/ML IJ SOLN
100.0000 mg | Freq: Two times a day (BID) | INTRAVENOUS | Status: DC
  Administered 2017-01-02 – 2017-01-03 (×2): 100 mg via INTRAVENOUS
  Filled 2017-01-02 (×2): qty 10

## 2017-01-02 NOTE — H&P (Addendum)
History and Physical    Glenda [name] BTD:[id number] DOB: 1934-04-13 DOA: 01/02/2017  PCP: Alroy Dust, L.Marlou Sa, MD Patient coming from: Home  Chief Complaint: Shortness of breath  HPI: Glenda [name] is a 83 y.o. female with medical history significant of coronary artery disease, diabetes, hypertension, hyperlipidemia, history of stroke admitted with increasing shortness of breath for the past few days. She reports lower extremity edema bilateral, orthopnea and dyspnea on exertion. She denies any chest pain, nausea, vomiting. She denies any thyroid issues. She reports that her blood pressure was running high and it was not controlled well. An echo done in the past shows an EF of 60%. She denies any fever chills or cough. She denies any nausea vomiting diarrhea. She denies any urinary complaints.   ED Course: She was found to have a BNP of 685, chest x-ray showed cardiomegaly with pulmonary vascular congestion. She received Lasix. Her blood pressure was 186/72 and 190/67. Her BUN was 34 creatinine was 2.42 which is up from 2.0 sodium 140 potassium 3.4, white count 7.7, hemoglobin 9.6, platelets 238. Troponin 0.03, EKG with nonspecific ST-T wave changes in the lateral leads. Review of Systems: As per HPI otherwise all other systems reviewed and are negative  Ambulatory Status:  Past Medical History:  Diagnosis Date  . Arthritis   . Breast cancer ([place])   . CAD (coronary artery disease)   . Chest pain   . Dizziness   . DM (diabetes mellitus) ([place])   . GERD (gastroesophageal reflux disease)   . HLD (hyperlipidemia)   . HTN (hypertension)   . Obesity   . Radiation 09/11/15-10/13/15   42.72 Gy to right breast, 12 Gy boost to right breast  . Shortness of breath dyspnea   . Stroke ([place])   . UTI (urinary tract infection)     Past Surgical History:  Procedure Laterality Date  . CHOLECYSTECTOMY    . colon polyps; hx    . CORONARY ANGIOPLASTY  02/14/09  . ESOPHAGOGASTRODUODENOSCOPY (EGD)  WITH PROPOFOL Left 09/08/2015   Procedure: ESOPHAGOGASTRODUODENOSCOPY (EGD) WITH PROPOFOL;  Surgeon: Clarene Essex, MD;  Location[facility] ENDOSCOPY;  Service: Endoscopy;  Laterality: Left;  . RADIOACTIVE SEED GUIDED MASTECTOMY WITH AXILLARY SENTINEL LYMPH NODE BIOPSY Right 07/17/2015   Procedure: RADIOACTIVE SEED GUIDED PARTIAL MASTECTOMY WITH AXILLARY SENTINEL LYMPH NODE BIOPSY;  Surgeon: Erroll Luna, MD;  Location: [facility];  Service: General;  Laterality: Right;    Social History   Social History  . Marital status: Widowed    Spouse name: N/A  . Number of children: 5  . Years of education: 12   Occupational History  . Retired    Social History Main Topics  . Smoking status: Never Smoker  . Smokeless tobacco: Never Used  . Alcohol use No  . Drug use: No  . Sexual activity: Not Currently   Other Topics Concern  . Not on file   Social History Narrative   Retired.    Lives with daughter   Caffeine use: Drinks soda and coffee and tea daily    No Known Allergies  Family History  Problem Relation Age of Onset  . Diabetes Mother   . Colon cancer Father        also had lung  . Diabetes Unknown   . Heart attack Unknown   . Diabetes Sister        [name]  . Breast cancer Sister       Prior to Admission medications   Medication Sig Start Date End  Date Taking? Authorizing Provider  amLODipine (NORVASC) 10 MG tablet Take 10 mg by mouth daily. Reported on 10/31/2015   Yes [provider]  aspirin 81 MG chewable tablet Chew 81 mg by mouth daily.   Yes [provider]  BYSTOLIC 20 MG TABS 20 mg daily. 07/07/16  Yes [provider]  citalopram (CELEXA) 20 MG tablet Take 20 mg by mouth daily.   Yes [provider]  cloNIDine (CATAPRES) 0.2 MG tablet Take 0.2 mg by mouth 2 (two) times daily.    Yes [provider]  clopidogrel (PLAVIX) 75 MG tablet Take 75 mg by mouth daily.    Yes [provider]  furosemide (LASIX) 80 MG tablet Take 20-30  mg by mouth 2 (two) times daily. Take 30mg  in the morning and 20mg  in the evening   Yes [provider]  HUMALOG KWIKPEN 100 UNIT/ML KiwkPen Inject 20-27 Units into the skin 2 (two) times daily. Inject 27 units in the morning and 20 units in the evening 04/30/14  Yes [provider]  hydrALAZINE (APRESOLINE) 50 MG tablet Take 50 mg by mouth 3 (three) times daily.   Yes [provider]  naproxen sodium (ANAPROX) 220 MG tablet Take 220 mg by mouth 2 (two) times daily as needed (pain).   Yes [provider]  olmesartan (BENICAR) 40 MG tablet Take 40 mg by mouth daily.   Yes [provider]  pravastatin (PRAVACHOL) 80 MG tablet Take 80 mg by mouth daily.   Yes [provider]  ranitidine (ZANTAC) 300 MG tablet Take 300 mg by mouth at bedtime.   Yes [provider]    Physical Exam: Vitals:   01/02/17 0849 01/02/17 0852 01/02/17 0938 01/02/17 1000  BP: (!) 190/67   (!) 186/72  Pulse: 61  (!) 58 (!) 59  Resp: 18  18   Temp: 98 F (36.7 C)     TempSrc: Oral     SpO2: (!) 77% 95% 97% 94%     General:  Appears calm and comfortable Eyes: PERRL, EOMI, normal lids, iris ENT: grossly normal hearing, lips & tongue, mmm Neck:  no LAD, masses or thyromegaly Cardiovascular: RRR, no m/r/g. BL LE edema. CRACKLES BL LEFT >RIGHT Respiratory: CRACKLES bilaterally, no w/r/r. Normal respiratory effort. Abdomen: soft, ntnd, NABS Skin:  no rash or induration seen on limited exam Musculoskeletal: grossly normal tone BUE/BLE, good ROM, no bony abnormality Psychiatric: grossly normal mood and affect, speech fluent and appropriate, AOx3 Neurologic: CN 2-12 grossly intact, moves all extremities in coordinated fashion, sensation intact  Labs on Admission: I have personally reviewed following labs and imaging studies  CBC:  Recent Labs Lab 01/02/17 0916  WBC 7.7  NEUTROABS 6.0  HGB 9.6*  HCT 30.0*  MCV 87.2  PLT 937   Basic Metabolic  Panel:  Recent Labs Lab 01/02/17 0916  NA 140  K 3.4*  CL 105  CO2 26  GLUCOSE 129*  BUN 34*  CREATININE 2.42*  CALCIUM 8.3*   GFR: CrCl cannot be calculated (Unknown ideal weight.). Liver Function Tests:  Recent Labs Lab 01/02/17 0916  AST 35  ALT 15  ALKPHOS 124  BILITOT 0.6  PROT 7.4  ALBUMIN 3.3*   No results for input(s): LIPASE, AMYLASE in the last 168 hours. No results for input(s): AMMONIA in the last 168 hours. Coagulation Profile: No results for input(s): INR, PROTIME in the last 168 hours. Cardiac Enzymes:  Recent Labs Lab 01/02/17 0916  TROPONINI 0.03*  BNP (last 3 results) No results for input(s): PROBNP in the last 8760 hours. HbA1C: No results for input(s): HGBA1C in the last 72 hours. CBG: No results for input(s): GLUCAP in the last 168 hours. Lipid Profile: No results for input(s): CHOL, HDL, LDLCALC, TRIG, CHOLHDL, LDLDIRECT in the last 72 hours. Thyroid Function Tests: No results for input(s): TSH, T4TOTAL, FREET4, T3FREE, THYROIDAB in the last 72 hours. Anemia Panel: No results for input(s): VITAMINB12, FOLATE, FERRITIN, TIBC, IRON, RETICCTPCT in the last 72 hours. Urine analysis:    Component Value Date/Time   COLORURINE YELLOW 08/24/2015 1824   APPEARANCEUR CLEAR 08/24/2015 1824   LABSPEC 1.018 08/24/2015 1824   PHURINE 5.0 08/24/2015 1824   GLUCOSEU 250 (A) 08/24/2015 1824   HGBUR NEGATIVE 08/24/2015 1824   BILIRUBINUR NEGATIVE 08/24/2015 1824   KETONESUR NEGATIVE 08/24/2015 1824   PROTEINUR 100 (A) 08/24/2015 1824   UROBILINOGEN 0.2 07/04/2014 1900   NITRITE NEGATIVE 08/24/2015 1824   LEUKOCYTESUR NEGATIVE 08/24/2015 1824    Creatinine Clearance: CrCl cannot be calculated (Unknown ideal weight.).  Sepsis Labs: @LABRCNTIP (procalcitonin:4,lacticidven:4) )No results found for this or any previous visit (from the past 240 hour(s)).   Radiological Exams on Admission: Dg Chest 2 View  Result Date: 01/02/2017 CLINICAL  DATA:  Shortness of breath. EXAM: CHEST  2 VIEW COMPARISON:  Radiograph of March 06, 2009. FINDINGS: Borderline cardiomegaly is noted. Mild central pulmonary vascular congestion is noted. Atherosclerosis of thoracic aorta is noted. No pneumothorax or significant pleural effusion is noted. Hypoinflation of the lungs is noted. Bilateral perihilar and basilar densities are noted concerning for edema or possibly pneumonia. Bony thorax is unremarkable. IMPRESSION: Borderline cardiomegaly with mild central pulmonary vascular congestion. Aortic atherosclerosis. Hypoinflation of the lungs with bilateral perihilar and basilar densities concerning for edema or possibly pneumonia. Electronically Signed   By: Marijo Conception, M.D.   On: 01/02/2017 09:41    EKG: Independently reviewed.   Assessment/Plan Active Problems:   * No active hospital problems. *   Congestive heart failure -related to persistent elevated hypertension. BP not well controlled at home. patient apparently had a history of diastolic CHF and was on Lasix at home. There is no documented history of systolic heart failure at this time that I could find. I will treated with IV diuresis, continue her cardiac medications that she was taking at home. I will hold ACE inhibitor because of her elevated creatinine of 2.42. Follow-up BNP and renal functions tomorrow. I will order an echocardiogram, TSH, troponin, and EKG. Her BNP was 685 on admission.i send a message to on call cardio through amion to be seen tomorrow.troponin mildly elevated at 0.03 likely due to renal failure.  Acute on chronic kidney disease- I will continue Lasix which probably will increase her creatinine again. Patient was taking naproxen tablets 2 times a day as needed for pain at home. Combination of NSAID with Benicar probably exacerbated her acute on chronic kidney disease.discussed with dr Florene Glen who will see her in the morning.  Hypertension-she was taking Norvasc, by systolic,  clonic clonidine, Lasix, hydralazine, Benicar at home. I will hold the Norvasc because of fluid retention restart Bystolic and clonidine and Lasix and hydralazine. I will hold her Benicar in view of her acute on chronic renal failure.     DVT prophylaxis:  Lovenox Code Status:  full code Disposition Plan:  discharge back home versus skilled nursing once stable Consults called:   Admission status:  telemetry inpatient   Georgette Shell MD [facility]  If 7PM-7AM, please contact night-coverage [email or web address] Password [email or web address]  01/02/2017, 11:43 AM

## 2017-01-02 NOTE — ED Provider Notes (Addendum)
[facility] DEPT Provider Note   CSN: [id number] Arrival date & time: 01/02/17  5465     History   Chief Complaint Chief Complaint  Patient presents with  . Shortness of Breath  . Wheezing    HPI Glenda [name] is a 83 y.o. female.  HPI  83 year old female presents with dyspnea. Started yesterday. Daughter noted she had similar dyspnea last week, EMS called but she declined transport. Seemed to go away. Came back yesterday. Still going on today.  Minimal cough. No fevers or chest pain. Has had BLE swelling since her stroke, seems to be somewhat worse recently. Has chronic lymphedema in RUE from prior breast cancer surgery, seems to be a little more swollen recently. More facial swelling as well. Has had some wheezing. No prior history of smoking or COPD/asthma.   Past Medical History:  Diagnosis Date  . Arthritis   . Breast cancer ([place])   . CAD (coronary artery disease)   . Chest pain   . Dizziness   . DM (diabetes mellitus) ([place])   . GERD (gastroesophageal reflux disease)   . HLD (hyperlipidemia)   . HTN (hypertension)   . Obesity   . Radiation 09/11/15-10/13/15   42.72 Gy to right breast, 12 Gy boost to right breast  . Shortness of breath dyspnea   . Stroke ([place])   . UTI (urinary tract infection)     Patient Active Problem List   Diagnosis Date Noted  . CHF (congestive heart failure) ([place]) 01/02/2017  . Blood pressure alteration 09/16/2016  . Somnolence, daytime 12/08/2015  . Cerebral thrombosis with cerebral infarction 09/07/2015  . Lacunar infarct, acute ([place])   . Nausea & vomiting 09/05/2015  . Type II diabetes mellitus with neurological manifestations, uncontrolled ([place]) 09/03/2015  . Bilateral lower extremity edema 09/03/2015  . Essential hypertension   . Diabetes mellitus type 2 in nonobese (HCC)   . History of breast cancer   . Acute blood loss anemia   . Hemiparesis ([place])   . Gait disturbance, post-stroke   . Dysphagia, post-stroke   . Acute  ischemic stroke/right cerebellar 08/24/2015  . Breast cancer of upper-outer quadrant of right female breast ([place]) 04/30/2015  . CVA (cerebral infarction) 07/05/2014  . Hyperlipidemia 07/05/2014  . Mild mitral regurgitation by prior echocardiogram 12/20/2011  . Diabetes mellitus type 1, uncontrolled, insulin dependent ([place]) 02/26/2009  . OBESITY 02/26/2009  . Uncontrolled hypertension 02/26/2009  . CAD (coronary artery disease) 02/26/2009  . GASTROESOPHAGEAL REFLUX DISEASE 02/26/2009    Past Surgical History:  Procedure Laterality Date  . CHOLECYSTECTOMY    . colon polyps; hx    . CORONARY ANGIOPLASTY  02/14/09  . ESOPHAGOGASTRODUODENOSCOPY (EGD) WITH PROPOFOL Left 09/08/2015   Procedure: ESOPHAGOGASTRODUODENOSCOPY (EGD) WITH PROPOFOL;  Surgeon: Clarene Essex, MD;  Location: [facility] ENDOSCOPY;  Service: Endoscopy;  Laterality: Left;  . RADIOACTIVE SEED GUIDED MASTECTOMY WITH AXILLARY SENTINEL LYMPH NODE BIOPSY Right 07/17/2015   Procedure: RADIOACTIVE SEED GUIDED PARTIAL MASTECTOMY WITH AXILLARY SENTINEL LYMPH NODE BIOPSY;  Surgeon: Erroll Luna, MD;  Location: [facility];  Service: General;  Laterality: Right;    OB History    No data available       Home Medications    Prior to Admission medications   Medication Sig Start Date End Date Taking? Authorizing Provider  amLODipine (NORVASC) 10 MG tablet Take 10 mg by mouth daily. Reported on 10/31/2015   Yes [provider]  aspirin 81 MG chewable tablet Chew 81 mg by mouth daily.  Yes [provider]  BYSTOLIC 20 MG TABS 20 mg daily. 07/07/16  Yes [provider]  citalopram (CELEXA) 20 MG tablet Take 20 mg by mouth daily.   Yes [provider]  cloNIDine (CATAPRES) 0.2 MG tablet Take 0.2 mg by mouth 2 (two) times daily.    Yes [provider]  clopidogrel (PLAVIX) 75 MG tablet Take 75 mg by mouth daily.    Yes [provider]  furosemide (LASIX) 80 MG tablet Take 20-30 mg by mouth 2 (two)  times daily. Take 30mg  in the morning and 20mg  in the evening   Yes [provider]  HUMALOG KWIKPEN 100 UNIT/ML KiwkPen Inject 20-27 Units into the skin 2 (two) times daily. Inject 27 units in the morning and 20 units in the evening 04/30/14  Yes [provider]  hydrALAZINE (APRESOLINE) 50 MG tablet Take 50 mg by mouth 3 (three) times daily.   Yes [provider]  naproxen sodium (ANAPROX) 220 MG tablet Take 220 mg by mouth 2 (two) times daily as needed (pain).   Yes [provider]  olmesartan (BENICAR) 40 MG tablet Take 40 mg by mouth daily.   Yes [provider]  pravastatin (PRAVACHOL) 80 MG tablet Take 80 mg by mouth daily.   Yes [provider]  ranitidine (ZANTAC) 300 MG tablet Take 300 mg by mouth at bedtime.   Yes [provider]    Family History Family History  Problem Relation Age of Onset  . Diabetes Mother   . Colon cancer Father        also had lung  . Diabetes Unknown   . Heart attack Unknown   . Diabetes Sister        [name]  . Breast cancer Sister     Social History Social History  Substance Use Topics  . Smoking status: Never Smoker  . Smokeless tobacco: Never Used  . Alcohol use No     Allergies   Patient has no known allergies.   Review of Systems Review of Systems  Constitutional: Negative for fever.  Respiratory: Positive for shortness of breath.   Cardiovascular: Positive for leg swelling. Negative for chest pain.  All other systems reviewed and are negative.    Physical Exam Updated Vital Signs BP (!) 205/62 (BP Location: Left Arm)   Pulse 63 Comment: resp 20  Temp 98.3 F (36.8 C) (Oral)   Resp (!) 24   Ht 5\' 6"  (1.676 m)   Wt 81.5 kg (179 lb 10.8 oz)   SpO2 93%   BMI 29.00 kg/m   Physical Exam  Constitutional: She is oriented to person, place, and time. She appears well-developed and well-nourished.  HENT:  Head: Normocephalic and atraumatic.  Right Ear: External ear  normal.  Left Ear: External ear normal.  Nose: Nose normal.  Eyes: Right eye exhibits no discharge. Left eye exhibits no discharge.  Cardiovascular: Normal rate, regular rhythm and normal heart sounds.   Pulmonary/Chest: Effort normal. No respiratory distress. She has wheezes.  Abdominal: Soft. There is no tenderness.  Musculoskeletal: She exhibits edema (BLE).  Neurological: She is alert and oriented to person, place, and time.  Skin: Skin is warm and dry.  Nursing note and vitals reviewed.    ED Treatments / Results  Labs (all labs ordered are listed, but only abnormal results are displayed) Labs Reviewed  COMPREHENSIVE METABOLIC PANEL - Abnormal; Notable for the following:       Result Value  Potassium 3.4 (*)    Glucose, Bld 129 (*)    BUN 34 (*)    Creatinine, Ser 2.42 (*)    Calcium 8.3 (*)    Albumin 3.3 (*)    GFR calc non Af Amer 18 (*)    GFR calc Af Amer 21 (*)    All other components within normal limits  BRAIN NATRIURETIC PEPTIDE - Abnormal; Notable for the following:    B Natriuretic Peptide 685.4 (*)    All other components within normal limits  TROPONIN I - Abnormal; Notable for the following:    Troponin I 0.03 (*)    All other components within normal limits  CBC WITH DIFFERENTIAL/PLATELET - Abnormal; Notable for the following:    RBC 3.44 (*)    Hemoglobin 9.6 (*)    HCT 30.0 (*)    All other components within normal limits  TSH  TROPONIN I  TROPONIN I  TROPONIN I    EKG  EKG Interpretation  Date/Time:  Sunday January 02 2017 08:46:07 EDT Ventricular Rate:  60 PR Interval:    QRS Duration: 93 QT Interval:  469 QTC Calculation: 469 R Axis:   47 Text Interpretation:  Sinus rhythm Anteroseptal infarct, old Borderline repolarization abnormality no significant change compared to 2017 Confirmed by Sherwood Gambler [phone]) on 01/02/2017 10:04:59 AM Also confirmed by Sherwood Gambler [phone]), editor Drema Pry [phone])  on 01/02/2017 10:26:01  AM       Radiology Dg Chest 2 View  Result Date: 01/02/2017 CLINICAL DATA:  Shortness of breath. EXAM: CHEST  2 VIEW COMPARISON:  Radiograph of March 06, 2009. FINDINGS: Borderline cardiomegaly is noted. Mild central pulmonary vascular congestion is noted. Atherosclerosis of thoracic aorta is noted. No pneumothorax or significant pleural effusion is noted. Hypoinflation of the lungs is noted. Bilateral perihilar and basilar densities are noted concerning for edema or possibly pneumonia. Bony thorax is unremarkable. IMPRESSION: Borderline cardiomegaly with mild central pulmonary vascular congestion. Aortic atherosclerosis. Hypoinflation of the lungs with bilateral perihilar and basilar densities concerning for edema or possibly pneumonia. Electronically Signed   By: Marijo Conception, M.D.   On: 01/02/2017 09:41    Procedures Procedures (including critical care time)  Medications Ordered in ED Medications  aspirin chewable tablet 81 mg (not administered)  nebivolol (BYSTOLIC) tablet 20 mg (not administered)  citalopram (CELEXA) tablet 20 mg (not administered)  cloNIDine (CATAPRES) tablet 0.2 mg (not administered)  clopidogrel (PLAVIX) tablet 75 mg (not administered)  hydrALAZINE (APRESOLINE) tablet 50 mg (not administered)  pravastatin (PRAVACHOL) tablet 80 mg (not administered)  furosemide (LASIX) 100 mg in dextrose 5 % 50 mL IVPB (not administered)  insulin aspart protamine- aspart (NOVOLOG MIX 70/30) injection 10 Units (not administered)  sodium chloride flush (NS) 0.9 % injection 3 mL (not administered)  sodium chloride flush (NS) 0.9 % injection 3 mL (not administered)  0.9 %  sodium chloride infusion (not administered)  acetaminophen (TYLENOL) tablet 650 mg (not administered)  ondansetron (ZOFRAN) injection 4 mg (not administered)  insulin aspart (novoLOG) injection 0-15 Units (not administered)  insulin aspart (novoLOG) injection 0-5 Units (not administered)  hydrALAZINE  (APRESOLINE) injection 20 mg (20 mg Intravenous Given 01/02/17 1537)  albuterol (PROVENTIL) (2.5 MG/3ML) 0.083% nebulizer solution 2.5 mg (not administered)  ipratropium-albuterol (DUONEB) 0.5-2.5 (3) MG/3ML nebulizer solution 3 mL (3 mLs Nebulization Given 01/02/17 0937)  furosemide (LASIX) injection 40 mg (40 mg Intravenous Given 01/02/17 1031)  potassium chloride SA (K-DUR,KLOR-CON) CR tablet 40 mEq (40 mEq Oral Given  01/02/17 1030)  furosemide (LASIX) injection 60 mg (60 mg Intravenous Given 01/02/17 1536)     Initial Impression / Assessment and Plan / ED Course  I have reviewed the triage vital signs and the nursing notes.  Pertinent labs & imaging results that were available during my care of the patient were reviewed by me and considered in my medical decision making (see chart for details).     Workup is consistent with likely new onset CHF. Has a prior grade 1 diastolic dysfunction a couple years ago, but never had symptomatic CHF. No distress. Will start IV lasix, give supportive O2 by [place], and admit to hospitalist for workup.   Final Clinical Impressions(s) / ED Diagnoses   Final diagnoses:  Acute congestive heart failure, unspecified heart failure type [facility])    New Prescriptions Current Discharge Medication List       Sherwood Gambler, MD 01/02/17 1640    Sherwood Gambler, MD 01/17/17 [phone]

## 2017-01-02 NOTE — Progress Notes (Signed)
ED-RN can call report to [facility]s at [phone] at 1:30pm.  Barbee [place]. Brigitte Pulse, RN

## 2017-01-02 NOTE — ED Notes (Signed)
Pt updated on delay, waiting for room, also pain level 0

## 2017-01-02 NOTE — ED Triage Notes (Signed)
Pt complains of shortness of breath and wheezing for the past few days. Pt called EMS last week but refused transport. Pt's sats were initially 77% on RA.

## 2017-01-03 ENCOUNTER — Inpatient Hospital Stay (HOSPITAL_COMMUNITY): Payer: Medicare (Managed Care)

## 2017-01-03 DIAGNOSIS — I340 Nonrheumatic mitral (valve) insufficiency: Secondary | ICD-10-CM

## 2017-01-03 DIAGNOSIS — E119 Type 2 diabetes mellitus without complications: Secondary | ICD-10-CM

## 2017-01-03 DIAGNOSIS — N184 Chronic kidney disease, stage 4 (severe): Secondary | ICD-10-CM | POA: Diagnosis present

## 2017-01-03 DIAGNOSIS — I10 Essential (primary) hypertension: Secondary | ICD-10-CM

## 2017-01-03 DIAGNOSIS — C50411 Malignant neoplasm of upper-outer quadrant of right female breast: Secondary | ICD-10-CM

## 2017-01-03 DIAGNOSIS — N179 Acute kidney failure, unspecified: Secondary | ICD-10-CM

## 2017-01-03 LAB — BASIC METABOLIC PANEL
Anion gap: 9 (ref 5–15)
BUN: 35 mg/dL — AB (ref 6–20)
CHLORIDE: 107 mmol/L (ref 101–111)
CO2: 26 mmol/L (ref 22–32)
Calcium: 8.2 mg/dL — ABNORMAL LOW (ref 8.9–10.3)
Creatinine, Ser: 2.43 mg/dL — ABNORMAL HIGH (ref 0.44–1.00)
GFR calc Af Amer: 21 mL/min — ABNORMAL LOW (ref >60)
GFR calc non Af Amer: 18 mL/min — ABNORMAL LOW (ref >60)
Glucose, Bld: 112 mg/dL — ABNORMAL HIGH (ref 65–99)
POTASSIUM: 3.4 mmol/L — AB (ref 3.5–5.1)
SODIUM: 142 mmol/L (ref 135–145)

## 2017-01-03 LAB — ECHOCARDIOGRAM COMPLETE
CHL CUP DOP CALC LVOT VTI: 24.4 cm
CHL CUP RV SYS PRESS: 51 mmHg
E decel time: 257 msec
EERAT: 20.96
FS: 37 % (ref 28–44)
Height: 66 in
IVS/LV PW RATIO, ED: 1.1
LA ID, A-P, ES: 49 mm
LA vol index: 55.5 mL/m2
LADIAMINDEX: 2.57 cm/m2
LAVOL: 106 mL
LAVOLA4C: 132 mL
LDCA: 2.84 cm2
LEFT ATRIUM END SYS DIAM: 49 mm
LV E/e'average: 20.96
LV PW d: 16.4 mm — AB (ref 0.6–1.1)
LV TDI E'LATERAL: 6.25
LVEEMED: 20.96
LVELAT: 6.25 cm/s
LVOT diameter: 19 mm
LVOT peak vel: 87.7 cm/s
LVOTSV: 69 mL
Lateral S' vel: 10.2 cm/s
MV Dec: 257
MV pk E vel: 131 m/s
MVPG: 7 mmHg
MVPKAVEL: 116 m/s
Reg peak vel: 299 cm/s
TAPSE: 36.9 mm
TDI e' medial: 4.19
TRMAXVEL: 299 cm/s
Weight: 2867.74 oz

## 2017-01-03 LAB — BRAIN NATRIURETIC PEPTIDE: B NATRIURETIC PEPTIDE 5: 802 pg/mL — AB (ref 0.0–100.0)

## 2017-01-03 LAB — TROPONIN I: TROPONIN I: 0.04 ng/mL — AB (ref <0.03)

## 2017-01-03 LAB — GLUCOSE, CAPILLARY
GLUCOSE-CAPILLARY: 151 mg/dL — AB (ref 65–99)
GLUCOSE-CAPILLARY: 202 mg/dL — AB (ref 65–99)
GLUCOSE-CAPILLARY: 88 mg/dL (ref 65–99)
GLUCOSE-CAPILLARY: 119 mg/dL — AB (ref 65–99)
Glucose-Capillary: 222 mg/dL — ABNORMAL HIGH (ref 65–99)
Glucose-Capillary: 154 mg/dL — ABNORMAL HIGH (ref 65–99)

## 2017-01-03 LAB — IRON AND TIBC
IRON: 20 ug/dL — AB (ref 28–170)
Saturation Ratios: 8 % — ABNORMAL LOW (ref 10.4–31.8)
TIBC: 242 ug/dL — AB (ref 250–450)
UIBC: 222 ug/dL

## 2017-01-03 MED ORDER — FUROSEMIDE 40 MG PO TABS: 160.0000 mg | ORAL_TABLET | Freq: Two times a day (BID) | ORAL | Status: DC

## 2017-01-03 MED ORDER — FUROSEMIDE 40 MG PO TABS
160.0000 mg | ORAL_TABLET | Freq: Two times a day (BID) | ORAL | Status: DC
  Administered 2017-01-03 – 2017-01-04 (×2): 160 mg via ORAL
  Filled 2017-01-03 (×2): qty 4

## 2017-01-03 MED ORDER — INSULIN ASPART 100 UNIT/ML ~~LOC~~ SOLN
3.0000 [IU] | Freq: Three times a day (TID) | SUBCUTANEOUS | Status: DC
  Administered 2017-01-03 – 2017-01-05 (×2): 3 [IU] via SUBCUTANEOUS

## 2017-01-03 MED ORDER — HEPARIN SODIUM (PORCINE) 5000 UNIT/ML IJ SOLN
5000.0000 [IU] | Freq: Three times a day (TID) | INTRAMUSCULAR | Status: DC
  Administered 2017-01-03 – 2017-01-04 (×3): 5000 [IU] via SUBCUTANEOUS
  Filled 2017-01-03 (×3): qty 1

## 2017-01-03 MED ORDER — FUROSEMIDE 10 MG/ML IJ SOLN: 40.0000 mg | Freq: Two times a day (BID) | INTRAMUSCULAR | Status: DC

## 2017-01-03 MED ORDER — CARVEDILOL 12.5 MG PO TABS
12.5000 mg | ORAL_TABLET | Freq: Two times a day (BID) | ORAL | Status: DC
  Administered 2017-01-03 – 2017-01-06 (×6): 12.5 mg via ORAL
  Filled 2017-01-03 (×6): qty 1

## 2017-01-03 MED ORDER — POTASSIUM CHLORIDE CRYS ER 20 MEQ PO TBCR
40.0000 meq | EXTENDED_RELEASE_TABLET | Freq: Two times a day (BID) | ORAL | Status: AC
  Administered 2017-01-03 (×2): 40 meq via ORAL
  Filled 2017-01-03 (×2): qty 2

## 2017-01-03 MED ORDER — FUROSEMIDE 10 MG/ML IJ SOLN
80.0000 mg | Freq: Two times a day (BID) | INTRAMUSCULAR | Status: DC
Start: 2017-01-03 — End: 2017-01-03

## 2017-01-03 MED ORDER — INSULIN DETEMIR 100 UNIT/ML ~~LOC~~ SOLN
15.0000 [IU] | Freq: Every day | SUBCUTANEOUS | Status: DC
  Administered 2017-01-04 – 2017-01-05 (×2): 15 [IU] via SUBCUTANEOUS
  Filled 2017-01-03 (×3): qty 0.15

## 2017-01-03 MED ORDER — INSULIN DETEMIR 100 UNIT/ML ~~LOC~~ SOLN
5.0000 [IU] | Freq: Once | SUBCUTANEOUS | Status: AC
  Administered 2017-01-03: 5 [IU] via SUBCUTANEOUS
  Filled 2017-01-03: qty 0.05

## 2017-01-03 NOTE — Consult Note (Signed)
Reason for Consult:CKD Referring Physician: Dr. [name] is an 83 y.o. female.  HPI:  83 yr female with hx CAD, Breast CA, CVA, Obesity, HTN, DM, GERD, and CKD4.  Baseline Cr 2-2.2.  Seen by Dr. Hollie Salk 6/18 in f/u.  Cr 2.02 at that time. Admitted with hx of DOB progressive over 1 wk.  Has progressive ankle edema, PND, orthop, DOE.  No fevers, N,V, Cough, or voiding sx. Some question or recent NSAIDs Constitutional: as above Eyes: glasses, no changes Ears, nose, mouth, throat, and face: sore gums Respiratory: as above, wheezing Cardiovascular: as above Gastrointestinal: negative Genitourinary:no Nocturia Integument/breast: negative Hematologic/lymphatic: hx anemia Musculoskeletal:R hip and shoulder Neurological: walker , R sided weakness Endocrine: negative Allergic/Immunologic: negative    Primary Nephrologist [place].   Past Medical History:  Diagnosis Date  . Arthritis   . Breast cancer ([place])   . CAD (coronary artery disease)   . Chest pain   . Dizziness   . DM (diabetes mellitus) ([place])   . GERD (gastroesophageal reflux disease)   . HLD (hyperlipidemia)   . HTN (hypertension)   . Obesity   . Radiation 09/11/15-10/13/15   42.72 Gy to right breast, 12 Gy boost to right breast  . Shortness of breath dyspnea   . Stroke ([place])   . UTI (urinary tract infection)     Past Surgical History:  Procedure Laterality Date  . CHOLECYSTECTOMY    . colon polyps; hx    . CORONARY ANGIOPLASTY  02/14/09  . ESOPHAGOGASTRODUODENOSCOPY (EGD) WITH PROPOFOL Left 09/08/2015   Procedure: ESOPHAGOGASTRODUODENOSCOPY (EGD) WITH PROPOFOL;  Surgeon: Clarene Essex, MD;  Location: [facility] ENDOSCOPY;  Service: Endoscopy;  Laterality: Left;  . RADIOACTIVE SEED GUIDED MASTECTOMY WITH AXILLARY SENTINEL LYMPH NODE BIOPSY Right 07/17/2015   Procedure: RADIOACTIVE SEED GUIDED PARTIAL MASTECTOMY WITH AXILLARY SENTINEL LYMPH NODE BIOPSY;  Surgeon: Erroll Luna, MD;  Location: [facility];  Service: General;   Laterality: Right;    Family History  Problem Relation Age of Onset  . Diabetes Mother   . Colon cancer Father        also had lung  . Diabetes Unknown   . Heart attack Unknown   . Diabetes Sister        [name]  . Breast cancer Sister     Social History:  reports that she has never smoked. She has never used smokeless tobacco. She reports that she does not drink alcohol or use drugs.  Allergies: No Known Allergies  Medications:  I have reviewed the patient's current medications. Prior to Admission:  Prescriptions Prior to Admission  Medication Sig Dispense Refill Last Dose  . amLODipine (NORVASC) 10 MG tablet Take 10 mg by mouth daily. Reported on 10/31/2015   01/01/2017 at Unknown time  . aspirin 81 MG chewable tablet Chew 81 mg by mouth daily.   01/01/2017 at Unknown time  . BYSTOLIC 20 MG TABS 20 mg daily.  5 01/01/2017 at Unknown time  . citalopram (CELEXA) 20 MG tablet Take 20 mg by mouth daily.   01/01/2017 at Unknown time  . cloNIDine (CATAPRES) 0.2 MG tablet Take 0.2 mg by mouth 2 (two) times daily.    01/01/2017 at Unknown time  . clopidogrel (PLAVIX) 75 MG tablet Take 75 mg by mouth daily.    01/01/2017 at Unknown time  . furosemide (LASIX) 20 MG tablet Take 20 mg by mouth 2 (two) times daily.   01/01/2017 at Unknown  . hydrALAZINE (APRESOLINE) 50 MG tablet Take 50 mg  by mouth 3 (three) times daily.   01/01/2017 at Unknown time  . insulin aspart protamine - aspart (NOVOLOG 70/30 MIX) (70-30) 100 UNIT/ML FlexPen Inject 20-27 Units into the skin 2 (two) times daily. Inject 27 units before breakfast and 20 units before supper.   01/01/2017 at Unknown  . naproxen sodium (ANAPROX) 220 MG tablet Take 220 mg by mouth 2 (two) times daily as needed (pain).   Past Week at Unknown time  . olmesartan (BENICAR) 40 MG tablet Take 40 mg by mouth daily.   01/01/2017 at Unknown time  . pravastatin (PRAVACHOL) 80 MG tablet Take 80 mg by mouth daily.   01/01/2017 at Unknown time  . ranitidine (ZANTAC) 300 MG tablet  Take 300 mg by mouth at bedtime.   01/01/2017 at Unknown time    Results for orders placed or performed during the hospital encounter of 01/02/17 (from the past 48 hour(s))  Comprehensive metabolic panel     Status: Abnormal   Collection Time: 01/02/17  9:16 AM  Result Value Ref Range   Sodium 140 135 - 145 mmol/L   Potassium 3.4 (L) 3.5 - 5.1 mmol/L   Chloride 105 101 - 111 mmol/L   CO2 26 22 - 32 mmol/L   Glucose, Bld 129 (H) 65 - 99 mg/dL   BUN 34 (H) 6 - 20 mg/dL   Creatinine, Ser 2.42 (H) 0.44 - 1.00 mg/dL   Calcium 8.3 (L) 8.9 - 10.3 mg/dL   Total Protein 7.4 6.5 - 8.1 g/dL   Albumin 3.3 (L) 3.5 - 5.0 g/dL   AST 35 15 - 41 U/L   ALT 15 14 - 54 U/L   Alkaline Phosphatase 124 38 - 126 U/L   Total Bilirubin 0.6 0.3 - 1.2 mg/dL   GFR calc non Af Amer 18 (L) >60 mL/min   GFR calc Af Amer 21 (L) >60 mL/min    Comment: (NOTE) The eGFR has been calculated using the CKD EPI equation. This calculation has not been validated in all clinical situations. eGFR's persistently <60 mL/min signify possible Chronic Kidney Disease.    Anion gap 9 5 - 15  Brain natriuretic peptide     Status: Abnormal   Collection Time: 01/02/17  9:16 AM  Result Value Ref Range   B Natriuretic Peptide 685.4 (H) 0.0 - 100.0 pg/mL  Troponin I     Status: Abnormal   Collection Time: 01/02/17  9:16 AM  Result Value Ref Range   Troponin I 0.03 (HH) <0.03 ng/mL    Comment: CRITICAL RESULT CALLED TO, READ BACK BY AND VERIFIED WITH: SMITH,T AT 10:20AM ON 01/02/17 BY FESTERMAN,C   CBC with Differential     Status: Abnormal   Collection Time: 01/02/17  9:16 AM  Result Value Ref Range   WBC 7.7 4.0 - 10.5 K/uL   RBC 3.44 (L) 3.87 - 5.11 MIL/uL   Hemoglobin 9.6 (L) 12.0 - 15.0 g/dL   HCT 30.0 (L) 36.0 - 46.0 %   MCV 87.2 78.0 - 100.0 fL   MCH 27.9 26.0 - 34.0 pg   MCHC 32.0 30.0 - 36.0 g/dL   RDW 13.9 11.5 - 15.5 %   Platelets 238 150 - 400 K/uL   Neutrophils Relative % 78 %   Neutro Abs 6.0 1.7 - 7.7 K/uL    Lymphocytes Relative 12 %   Lymphs Abs 0.9 0.7 - 4.0 K/uL   Monocytes Relative 8 %   Monocytes Absolute 0.6 0.1 - 1.0 K/uL  Eosinophils Relative 2 %   Eosinophils Absolute 0.2 0.0 - 0.7 K/uL   Basophils Relative 0 %   Basophils Absolute 0.0 0.0 - 0.1 K/uL  TSH     Status: None   Collection Time: 01/02/17  3:35 PM  Result Value Ref Range   TSH 1.145 0.350 - 4.500 uIU/mL    Comment: Performed by a 3rd Generation assay with a functional sensitivity of <=0.01 uIU/mL.  Troponin I (q 6hr x 3)     Status: Abnormal   Collection Time: 01/02/17  3:35 PM  Result Value Ref Range   Troponin I 0.03 (HH) <0.03 ng/mL    Comment: CRITICAL VALUE NOTED.  VALUE IS CONSISTENT WITH PREVIOUSLY REPORTED AND CALLED VALUE.  Troponin I (q 6hr x 3)     Status: Abnormal   Collection Time: 01/02/17  9:56 PM  Result Value Ref Range   Troponin I 0.03 (HH) <0.03 ng/mL    Comment: CRITICAL VALUE NOTED.  VALUE IS CONSISTENT WITH PREVIOUSLY REPORTED AND CALLED VALUE.  Troponin I (q 6hr x 3)     Status: Abnormal   Collection Time: 01/03/17 12:18 AM  Result Value Ref Range   Troponin I 0.04 (HH) <0.03 ng/mL    Comment: CRITICAL VALUE NOTED.  VALUE IS CONSISTENT WITH PREVIOUSLY REPORTED AND CALLED VALUE.  Brain natriuretic peptide     Status: Abnormal   Collection Time: 01/03/17 12:18 AM  Result Value Ref Range   B Natriuretic Peptide 802.0 (H) 0.0 - 100.0 pg/mL  Basic metabolic panel     Status: Abnormal   Collection Time: 01/03/17 12:18 AM  Result Value Ref Range   Sodium 142 135 - 145 mmol/L   Potassium 3.4 (L) 3.5 - 5.1 mmol/L   Chloride 107 101 - 111 mmol/L   CO2 26 22 - 32 mmol/L   Glucose, Bld 112 (H) 65 - 99 mg/dL   BUN 35 (H) 6 - 20 mg/dL   Creatinine, Ser 2.43 (H) 0.44 - 1.00 mg/dL   Calcium 8.2 (L) 8.9 - 10.3 mg/dL   GFR calc non Af Amer 18 (L) >60 mL/min   GFR calc Af Amer 21 (L) >60 mL/min    Comment: (NOTE) The eGFR has been calculated using the CKD EPI equation. This calculation has not  been validated in all clinical situations. eGFR's persistently <60 mL/min signify possible Chronic Kidney Disease.    Anion gap 9 5 - 15  Glucose, capillary     Status: Abnormal   Collection Time: 01/03/17  8:29 AM  Result Value Ref Range   Glucose-Capillary 151 (H) 65 - 99 mg/dL    Dg Chest 2 View  Result Date: 01/02/2017 CLINICAL DATA:  Shortness of breath. EXAM: CHEST  2 VIEW COMPARISON:  Radiograph of March 06, 2009. FINDINGS: Borderline cardiomegaly is noted. Mild central pulmonary vascular congestion is noted. Atherosclerosis of thoracic aorta is noted. No pneumothorax or significant pleural effusion is noted. Hypoinflation of the lungs is noted. Bilateral perihilar and basilar densities are noted concerning for edema or possibly pneumonia. Bony thorax is unremarkable. IMPRESSION: Borderline cardiomegaly with mild central pulmonary vascular congestion. Aortic atherosclerosis. Hypoinflation of the lungs with bilateral perihilar and basilar densities concerning for edema or possibly pneumonia. Electronically Signed   By: Marijo Conception, M.D.   On: 01/02/2017 09:41    ROS Blood pressure (!) 159/53, pulse 60, temperature 99 F (37.2 C), temperature source Oral, resp. rate 20, height _0  (1.676 m), weight 81.3 kg (179 lb 3.7  oz), SpO2 92 %. Physical Exam Physical Examination: General appearance - overweight, chronically ill appearing and no distress, on O2 Mental status - alert, oriented to person, place, and time, some memory issue Eyes - pupils equal and reactive, extraocular eye movements intact, funduscopic exam normal, discs flat and sharp Mouth - upper plate Neck - adenopathy noted PCL Lymphatics - posterior cervical nodes Chest - rales noted bibasilar, decreased air entry noted bilat Heart - S1 and S2 normal, S4 present, systolic murmur EX9/3 at apex Abdomen - obese, liver down 6 cm , pos bs, Musculoskeletal - no joint tenderness, deformity or swelling Extremities - pedal  edema 2+ +, peripheral pulses abnormal 1+ DPs Skin - normal coloration and turgor, no rashes, no suspicious skin lesions noted  Assessment/Plan: 1 CKD 4 mild worse with CHF.  ? NSAID role. Do not feel ARB an issue. Needs diuresis. Has gradually worsening GFR.  Needs diuresis. Can do po.  Will check urine , U/S,and secondary issues. ?? Role NSAIDs 2 Volume xs , 3 Hypertension: needs diuresis 4. Anemia eval 5. Metabolic Bone Disease: eval 6 DM controlled 7 CVA 8 Breast Ca P po Lasix, check PTH, Fe, UA, U/S, give K  Glenda [name] L 01/03/2017, 11:02 AM

## 2017-01-03 NOTE — Care Management Note (Signed)
Case Management Note  Patient Details  Name: Glenda [name] MRN: [id number] Date of Birth: 07/02/1933  Subjective/Objective: 83 y/o f admitted w/CHF. Hx: CKD. From home w/family. Active w/[facility]-patient has CSW, & HHC through [facility]. PT cons-await recc.                   Action/Plan:d/c plan home.   Expected Discharge Date:                  Expected Discharge Plan:  Home/Self Care  In-House Referral:     Discharge planning Services  CM Consult  Post Acute Care Choice:  Resumption of Svcs/PTA Provider ([facility]) Choice offered to:     DME Arranged:    DME Agency:     HH Arranged:    [place] Agency:     Status of Service:  In process, will continue to follow  If discussed at Long Length of Stay Meetings, dates discussed:    Additional Comments:  Dessa Phi, RN 01/03/2017, 12:48 PM

## 2017-01-03 NOTE — Progress Notes (Signed)
PROGRESS NOTE    Glenda [name]  WCH:[id number] DOB: 04-17-1934 DOA: 01/02/2017 PCP: Alroy Dust, L.Marlou Sa, MD  Brief Narrative: Glenda [name] is a 83 y.o. female  with CKD 4, DM, CAD, HTN, h/o CVA admitted with increasing shortness of breath for the past few days. She reports lower extremity edema bilateral, orthopnea and dyspnea on exertion.  CXR with CHF, creatinine 2.4 now, slightly up from 2.0 recently  Assessment & Plan:     AKI on CKD 4 with volume overload -due to underlying DM and likely cardiorenal component -tells me she only took 1 dose of aleve last week -ARB stopped -agree with lasix, appreciate Renal consult, changed to PO lasix today at 160mg  bid per Renal -monitor I/O, weights -Bmet in am -repeat ECHO today -check Renal US    Acute on chronic diastolic CHF -diurese as above -update ECHO   Uncontrolled HTN -improving with diuresis -on clonidine, hydralazine and coreg -will need dose titration as vol status improves    H/o CVA -continue ASA/plavix/statin -Pt eval    CAD -no ACS, Mild elevated troponin/flat trend- due to demand/renal failure -FU ECHO -continue ASA/Plavix/Coreg    DM on Insulin -change Insulin 70/30 to Levemir and novolog SSI now -FU Hba1c    H/o Breast cancer of upper-outer quadrant of right female breast (HCC) -s/p mastectomy and XRT  DVT prophylaxis: Hep SQ Code Status: daughter at bedside Family Communication: daughter at bedside Disposition Plan: home vs ST rehab pending clinical improvement  Consultants:   Cards  Renal   Subjective: Breathing better, peeing less Objective: Vitals:   01/02/17 2035 01/02/17 2242 01/03/17 0404 01/03/17 0405  BP: (!) 166/49  (!) 159/53   Pulse: 65 64 60   Resp: 20 14 20    Temp: 98.6 F (37 C)  99 F (37.2 C)   TempSrc: Oral  Oral   SpO2: 93% 93% 92%   Weight:    81.3 kg (179 lb 3.7 oz)  Height:        Intake/Output Summary (Last 24 hours) at 01/03/17 1259 Last data filed at  01/03/17 1000  Gross per 24 hour  Intake              405 ml  Output              800 ml  Net             -395 ml   Filed Weights   01/02/17 1437 01/03/17 0405  Weight: 81.5 kg (179 lb 10.8 oz) 81.3 kg (179 lb 3.7 oz)    Examination:  General exam:Elderly frail female, laying in bed, no distress Respiratory system: decreased BS at bases Cardiovascular system: S1 & S2 heard, RRR. No JVD, murmurs, 2 plus edema noted. Gastrointestinal system: Abdomen is nondistended, soft and nontender. Normal bowel sounds heard. Central nervous system: Alert and oriented. No focal neurological deficits. Extremities: 2-3plus edema Skin: No rashes, lesions or ulcers Psychiatry: Judgement and insight appear normal. Mood & affect appropriate.     Data Reviewed:   CBC:  Recent Labs Lab 01/02/17 0916  WBC 7.7  NEUTROABS 6.0  HGB 9.6*  HCT 30.0*  MCV 87.2  PLT 353   Basic Metabolic Panel:  Recent Labs Lab 01/02/17 0916 01/03/17 0018  NA 140 142  K 3.4* 3.4*  CL 105 107  CO2 26 26  GLUCOSE 129* 112*  BUN 34* 35*  CREATININE 2.42* 2.43*  CALCIUM 8.3* 8.2*   GFR: Estimated Creatinine Clearance: 19.9 mL/min (A) (by  C-G formula based on SCr of 2.43 mg/dL (H)). Liver Function Tests:  Recent Labs Lab 01/02/17 0916  AST 35  ALT 15  ALKPHOS 124  BILITOT 0.6  PROT 7.4  ALBUMIN 3.3*   No results for input(s): LIPASE, AMYLASE in the last 168 hours. No results for input(s): AMMONIA in the last 168 hours. Coagulation Profile: No results for input(s): INR, PROTIME in the last 168 hours. Cardiac Enzymes:  Recent Labs Lab 01/02/17 0916 01/02/17 1535 01/02/17 2156 01/03/17 0018  TROPONINI 0.03* 0.03* 0.03* 0.04*   BNP (last 3 results) No results for input(s): PROBNP in the last 8760 hours. HbA1C: No results for input(s): HGBA1C in the last 72 hours. CBG:  Recent Labs Lab 01/03/17 0829 01/03/17 1149  GLUCAP 151* 202*   Lipid Profile: No results for input(s): CHOL,  HDL, LDLCALC, TRIG, CHOLHDL, LDLDIRECT in the last 72 hours. Thyroid Function Tests:  Recent Labs  01/02/17 1535  TSH 1.145   Anemia Panel: No results for input(s): VITAMINB12, FOLATE, FERRITIN, TIBC, IRON, RETICCTPCT in the last 72 hours. Urine analysis:    Component Value Date/Time   COLORURINE YELLOW 08/24/2015 1824   APPEARANCEUR CLEAR 08/24/2015 1824   LABSPEC 1.018 08/24/2015 1824   PHURINE 5.0 08/24/2015 1824   GLUCOSEU 250 (A) 08/24/2015 1824   HGBUR NEGATIVE 08/24/2015 1824   BILIRUBINUR NEGATIVE 08/24/2015 1824   KETONESUR NEGATIVE 08/24/2015 1824   PROTEINUR 100 (A) 08/24/2015 1824   UROBILINOGEN 0.2 07/04/2014 1900   NITRITE NEGATIVE 08/24/2015 1824   LEUKOCYTESUR NEGATIVE 08/24/2015 1824   Sepsis Labs: @LABRCNTIP (procalcitonin:4,lacticidven:4)  )No results found for this or any previous visit (from the past 240 hour(s)).       Radiology Studies: Dg Chest 2 View  Result Date: 01/02/2017 CLINICAL DATA:  Shortness of breath. EXAM: CHEST  2 VIEW COMPARISON:  Radiograph of March 06, 2009. FINDINGS: Borderline cardiomegaly is noted. Mild central pulmonary vascular congestion is noted. Atherosclerosis of thoracic aorta is noted. No pneumothorax or significant pleural effusion is noted. Hypoinflation of the lungs is noted. Bilateral perihilar and basilar densities are noted concerning for edema or possibly pneumonia. Bony thorax is unremarkable. IMPRESSION: Borderline cardiomegaly with mild central pulmonary vascular congestion. Aortic atherosclerosis. Hypoinflation of the lungs with bilateral perihilar and basilar densities concerning for edema or possibly pneumonia. Electronically Signed   By: Marijo Conception, M.D.   On: 01/02/2017 09:41        Scheduled Meds: . aspirin  81 mg Oral Daily  . carvedilol  12.5 mg Oral BID WC  . citalopram  20 mg Oral Daily  . cloNIDine  0.2 mg Oral BID  . clopidogrel  75 mg Oral Daily  . furosemide  160 mg Oral BID  . heparin  subcutaneous  5,000 Units Subcutaneous Q8H  . hydrALAZINE  50 mg Oral TID  . insulin aspart  0-15 Units Subcutaneous TID WC  . insulin aspart  0-5 Units Subcutaneous QHS  . insulin aspart  3 Units Subcutaneous TID WC  . insulin detemir  15 Units Subcutaneous QHS  . potassium chloride  40 mEq Oral BID  . pravastatin  80 mg Oral Daily  . sodium chloride flush  3 mL Intravenous Q12H   Continuous Infusions: . sodium chloride       LOS: 1 day    Time spent: 69min    Domenic Polite, MD [facility] Pager [phone]  If 7PM-7AM, please contact night-coverage [email or web address] Password [email or web address] 01/03/2017, 12:59 PM

## 2017-01-03 NOTE — Progress Notes (Signed)
  Echocardiogram 2D Echocardiogram has been performed.  Donata Clay 01/03/2017, 12:40 PM

## 2017-01-03 NOTE — Consult Note (Signed)
Reason for Consult:decompensated congestive heart failure Referring Physician:[facility]  Lucill Mauck is an 83 y.o. female.  HPI:the patient is a 83 year old female with past medical history significant for coronary artery disease, hypertension, diabetes mellitus, hyperlipidemia, history of CVA in the past, chronic kidney disease, was admitted yesterday because of progressive increasing shortness of breath for the last 3 days associated with neck swelling.  Patient also gives history of exertional dyspnea with minimal exertion walking to the restroom with the help of walker.  Patient denies any chest pain, nausea, vomiting or diaphoresis.activity Limited.  Denies any excessive salty food intake.  Denies any fever or chills.  EKG done in the EGD showed normal sinus rhythm with septal Q waves and minor ST-T wave changes in lateral leads and was noted to have elevated BNP.patient denies any cardiac workup in the past.  Past Medical History:  Diagnosis Date  . Arthritis   . Breast cancer ([place])   . CAD (coronary artery disease)   . Chest pain   . Dizziness   . DM (diabetes mellitus) ([place])   . GERD (gastroesophageal reflux disease)   . HLD (hyperlipidemia)   . HTN (hypertension)   . Obesity   . Radiation 09/11/15-10/13/15   42.72 Gy to right breast, 12 Gy boost to right breast  . Shortness of breath dyspnea   . Stroke ([place])   . UTI (urinary tract infection)     Past Surgical History:  Procedure Laterality Date  . CHOLECYSTECTOMY    . colon polyps; hx    . CORONARY ANGIOPLASTY  02/14/09  . ESOPHAGOGASTRODUODENOSCOPY (EGD) WITH PROPOFOL Left 09/08/2015   Procedure: ESOPHAGOGASTRODUODENOSCOPY (EGD) WITH PROPOFOL;  Surgeon: Clarene Essex, MD;  Location[facility] ENDOSCOPY;  Service: Endoscopy;  Laterality: Left;  . RADIOACTIVE SEED GUIDED MASTECTOMY WITH AXILLARY SENTINEL LYMPH NODE BIOPSY Right 07/17/2015   Procedure: RADIOACTIVE SEED GUIDED PARTIAL MASTECTOMY WITH AXILLARY SENTINEL LYMPH NODE  BIOPSY;  Surgeon: Erroll Luna, MD;  Location: [facility];  Service: General;  Laterality: Right;    Family History  Problem Relation Age of Onset  . Diabetes Mother   . Colon cancer Father        also had lung  . Diabetes Unknown   . Heart attack Unknown   . Diabetes Sister        [name]  . Breast cancer Sister     Social History:  reports that she has never smoked. She has never used smokeless tobacco. She reports that she does not drink alcohol or use drugs.  Allergies: No Known Allergies  Medications: I have reviewed the patient's current medications.  Results for orders placed or performed during the hospital encounter of 01/02/17 (from the past 48 hour(s))  Comprehensive metabolic panel     Status: Abnormal   Collection Time: 01/02/17  9:16 AM  Result Value Ref Range   Sodium 140 135 - 145 mmol/L   Potassium 3.4 (L) 3.5 - 5.1 mmol/L   Chloride 105 101 - 111 mmol/L   CO2 26 22 - 32 mmol/L   Glucose, Bld 129 (H) 65 - 99 mg/dL   BUN 34 (H) 6 - 20 mg/dL   Creatinine, Ser 2.42 (H) 0.44 - 1.00 mg/dL   Calcium 8.3 (L) 8.9 - 10.3 mg/dL   Total Protein 7.4 6.5 - 8.1 g/dL   Albumin 3.3 (L) 3.5 - 5.0 g/dL   AST 35 15 - 41 U/L   ALT 15 14 - 54 U/L   Alkaline Phosphatase 124 38 -  126 U/L   Total Bilirubin 0.6 0.3 - 1.2 mg/dL   GFR calc non Af Amer 18 (L) >60 mL/min   GFR calc Af Amer 21 (L) >60 mL/min    Comment: (NOTE) The eGFR has been calculated using the CKD EPI equation. This calculation has not been validated in all clinical situations. eGFR's persistently <60 mL/min signify possible Chronic Kidney Disease.    Anion gap 9 5 - 15  Brain natriuretic peptide     Status: Abnormal   Collection Time: 01/02/17  9:16 AM  Result Value Ref Range   B Natriuretic Peptide 685.4 (H) 0.0 - 100.0 pg/mL  Troponin I     Status: Abnormal   Collection Time: 01/02/17  9:16 AM  Result Value Ref Range   Troponin I 0.03 (HH) <0.03 ng/mL    Comment: CRITICAL RESULT CALLED TO, READ BACK BY  AND VERIFIED WITH: SMITH,T AT 10:20AM ON 01/02/17 BY FESTERMAN,C   CBC with Differential     Status: Abnormal   Collection Time: 01/02/17  9:16 AM  Result Value Ref Range   WBC 7.7 4.0 - 10.5 K/uL   RBC 3.44 (L) 3.87 - 5.11 MIL/uL   Hemoglobin 9.6 (L) 12.0 - 15.0 g/dL   HCT 30.0 (L) 36.0 - 46.0 %   MCV 87.2 78.0 - 100.0 fL   MCH 27.9 26.0 - 34.0 pg   MCHC 32.0 30.0 - 36.0 g/dL   RDW 13.9 11.5 - 15.5 %   Platelets 238 150 - 400 K/uL   Neutrophils Relative % 78 %   Neutro Abs 6.0 1.7 - 7.7 K/uL   Lymphocytes Relative 12 %   Lymphs Abs 0.9 0.7 - 4.0 K/uL   Monocytes Relative 8 %   Monocytes Absolute 0.6 0.1 - 1.0 K/uL   Eosinophils Relative 2 %   Eosinophils Absolute 0.2 0.0 - 0.7 K/uL   Basophils Relative 0 %   Basophils Absolute 0.0 0.0 - 0.1 K/uL  TSH     Status: None   Collection Time: 01/02/17  3:35 PM  Result Value Ref Range   TSH 1.145 0.350 - 4.500 uIU/mL    Comment: Performed by a 3rd Generation assay with a functional sensitivity of <=0.01 uIU/mL.  Troponin I (q 6hr x 3)     Status: Abnormal   Collection Time: 01/02/17  3:35 PM  Result Value Ref Range   Troponin I 0.03 (HH) <0.03 ng/mL    Comment: CRITICAL VALUE NOTED.  VALUE IS CONSISTENT WITH PREVIOUSLY REPORTED AND CALLED VALUE.  Troponin I (q 6hr x 3)     Status: Abnormal   Collection Time: 01/02/17  9:56 PM  Result Value Ref Range   Troponin I 0.03 (HH) <0.03 ng/mL    Comment: CRITICAL VALUE NOTED.  VALUE IS CONSISTENT WITH PREVIOUSLY REPORTED AND CALLED VALUE.  Troponin I (q 6hr x 3)     Status: Abnormal   Collection Time: 01/03/17 12:18 AM  Result Value Ref Range   Troponin I 0.04 (HH) <0.03 ng/mL    Comment: CRITICAL VALUE NOTED.  VALUE IS CONSISTENT WITH PREVIOUSLY REPORTED AND CALLED VALUE.  Brain natriuretic peptide     Status: Abnormal   Collection Time: 01/03/17 12:18 AM  Result Value Ref Range   B Natriuretic Peptide 802.0 (H) 0.0 - 100.0 pg/mL  Basic metabolic panel     Status: Abnormal    Collection Time: 01/03/17 12:18 AM  Result Value Ref Range   Sodium 142 135 - 145 mmol/L  Potassium 3.4 (L) 3.5 - 5.1 mmol/L   Chloride 107 101 - 111 mmol/L   CO2 26 22 - 32 mmol/L   Glucose, Bld 112 (H) 65 - 99 mg/dL   BUN 35 (H) 6 - 20 mg/dL   Creatinine, Ser 2.43 (H) 0.44 - 1.00 mg/dL   Calcium 8.2 (L) 8.9 - 10.3 mg/dL   GFR calc non Af Amer 18 (L) >60 mL/min   GFR calc Af Amer 21 (L) >60 mL/min    Comment: (NOTE) The eGFR has been calculated using the CKD EPI equation. This calculation has not been validated in all clinical situations. eGFR's persistently <60 mL/min signify possible Chronic Kidney Disease.    Anion gap 9 5 - 15  Glucose, capillary     Status: Abnormal   Collection Time: 01/03/17  8:29 AM  Result Value Ref Range   Glucose-Capillary 151 (H) 65 - 99 mg/dL    Dg Chest 2 View  Result Date: 01/02/2017 CLINICAL DATA:  Shortness of breath. EXAM: CHEST  2 VIEW COMPARISON:  Radiograph of March 06, 2009. FINDINGS: Borderline cardiomegaly is noted. Mild central pulmonary vascular congestion is noted. Atherosclerosis of thoracic aorta is noted. No pneumothorax or significant pleural effusion is noted. Hypoinflation of the lungs is noted. Bilateral perihilar and basilar densities are noted concerning for edema or possibly pneumonia. Bony thorax is unremarkable. IMPRESSION: Borderline cardiomegaly with mild central pulmonary vascular congestion. Aortic atherosclerosis. Hypoinflation of the lungs with bilateral perihilar and basilar densities concerning for edema or possibly pneumonia. Electronically Signed   By: Marijo Conception, M.D.   On: 01/02/2017 09:41    Review of Systems  Constitutional: Negative for chills and fever.  HENT: Negative for hearing loss.   Eyes: Negative for blurred vision.  Respiratory: Positive for shortness of breath. Negative for cough.   Cardiovascular: Positive for leg swelling. Negative for chest pain.  Gastrointestinal: Negative for abdominal  pain, nausea and vomiting.  Genitourinary: Negative for dysuria.  Neurological: Negative for dizziness.   Blood pressure (!) 159/53, pulse 60, temperature 99 F (37.2 C), temperature source Oral, resp. rate 20, height '5\' 6"'  (1.676 m), weight 81.3 kg (179 lb 3.7 oz), SpO2 92 %. Physical Exam  Constitutional: She is oriented to person, place, and time.  HENT:  Head: Normocephalic and atraumatic.  Eyes: Conjunctivae are normal. Left eye exhibits discharge. No scleral icterus.  Neck: Normal range of motion. Neck supple. JVD present. No tracheal deviation present. No thyromegaly present.  Cardiovascular: Normal rate.   Murmur (soft systolic murmur noted) heard. Respiratory:  Decreased breath sounds at bases with faint rales noted  GI: Soft. Bowel sounds are normal.  Musculoskeletal:  No clubbing, cyanosis.  Trace edema noted  Neurological: She is alert and oriented to person, place, and time.    Assessment/Plan: Acute decompensated congestive heart failure with minor EKG changes, rule out ischemia, rule out MI. Coronary artery disease. Hypertension. Diabetes mellitus. Acute on chronic kidney disease. History of CVA. Morbid obesity. Hyperlipidemia. Plan Will switch Bystolic to Carvedilol as per orders Reduce Lasix to 40 mg IV every 12 hours Agree with holding ACE inhibitor for now in view of worsening renal function. Check 2-D echo. Discussed with patient and her daughter at length regarding various options of treatment, I.e., medical versus ischemic workup once fully compensated. Check old records. Monitor renal function closely  Charolette Forward 01/03/2017, 9:16 AM

## 2017-01-03 NOTE — Evaluation (Signed)
Physical Therapy Evaluation Patient Details Name: Glenda [name] MRN: [id number] DOB: Oct 26, 1933 Today's Date: 01/03/2017   History of Present Illness  83 y.o. female with PMHx significant for CKD 4, DM, CAD, HTN, CVA, breast cancer and admitted with increasing shortness of breath, lower extremity edema bilateral, orthopnea and dyspnea on exertion; diagnosed with AKI on CKD 4 with volume overload and acute on chronic CHF  Clinical Impression  Pt admitted with above diagnosis. Pt currently with functional limitations due to the deficits listed below (see PT Problem List).  Pt will benefit from skilled PT to increase their independence and safety with mobility to allow discharge to the venue listed below.  Pt ambulated short distance in hallway and remained on 2L O2 [place].  Pt has 24/7 assist upon d/c and reports she is receiving PACE services.     Follow Up Recommendations Home health PT;Supervision/Assistance - 24 hour    Equipment Recommendations  None recommended by PT    Recommendations for Other Services       Precautions / Restrictions Precautions Precautions: Fall Precaution Comments: monitor sats      Mobility  Bed Mobility               General bed mobility comments: pt up in recliner on arrival  Transfers Overall transfer level: Needs assistance Equipment used: Rolling walker (2 wheeled) Transfers: Sit to/from Stand Sit to Stand: Min guard         General transfer comment: slow transfers however no physical assist required  Ambulation/Gait Ambulation/Gait assistance: Min guard Ambulation Distance (Feet): 80 Feet Assistive device: Rolling walker (2 wheeled) Gait Pattern/deviations: Step-through pattern;Decreased stride length     General Gait Details: verbal cues for RW positioning, gait appears unsteady however pt did not require physical assist or have significant LOB, remained on 2L O2 [place] and SPO2 94%  Stairs            Wheelchair Mobility     Modified Rankin (Stroke Patients Only)       Balance Overall balance assessment: History of Falls                                           Pertinent Vitals/Pain Pain Assessment: No/denies pain    Home Living Family/patient expects to be discharged to:: Private residence Living Arrangements: Children Available Help at Discharge: Family;Available 24 hours/day;Friend(s) Type of Home: House Home Access: Level entry     Home Layout: One level Home Equipment: Walker - 2 wheels;Cane - single point Additional Comments: daughter reports pt has had 24/7 supervision for the last 2 years    Prior Function Level of Independence: Independent with assistive device(s)         Comments: uses RW     Hand Dominance        Extremity/Trunk Assessment        Lower Extremity Assessment Lower Extremity Assessment: Generalized weakness       Communication   Communication: No difficulties  Cognition Arousal/Alertness: Awake/alert Behavior During Therapy: WFL for tasks assessed/performed Overall Cognitive Status: Within Functional Limits for tasks assessed                                        General Comments      Exercises  Assessment/Plan    PT Assessment Patient needs continued PT services  PT Problem List Decreased strength;Decreased mobility;Decreased activity tolerance;Decreased balance       PT Treatment Interventions Gait training;DME instruction;Therapeutic activities;Therapeutic exercise;Functional mobility training;Patient/family education;Balance training    PT Goals (Current goals can be found in the Care Plan section)  Acute Rehab PT Goals PT Goal Formulation: With patient Time For Goal Achievement: 01/10/17 Potential to Achieve Goals: Good    Frequency Min 3X/week   Barriers to discharge        Co-evaluation               AM-PAC PT "6 Clicks" Daily Activity  Outcome Measure Difficulty turning  over in bed (including adjusting bedclothes, sheets and blankets)?: None Difficulty moving from lying on back to sitting on the side of the bed? : A Little Difficulty sitting down on and standing up from a chair with arms (e.g., wheelchair, bedside commode, etc,.)?: A Lot Help needed moving to and from a bed to chair (including a wheelchair)?: A Little Help needed walking in hospital room?: A Little Help needed climbing 3-5 steps with a railing? : A Little 6 Click Score: 18    End of Session Equipment Utilized During Treatment: Gait belt;Oxygen Activity Tolerance: Patient tolerated treatment well Patient left: in chair;with call bell/phone within reach;with family/visitor present;with chair alarm set   PT Visit Diagnosis: Difficulty in walking, not elsewhere classified (R26.2)    Time: 1856-3149 PT Time Calculation (min) (ACUTE ONLY): 15 min   Charges:   PT Evaluation $PT Eval Low Complexity: 1 Low     PT G CodesCarmelia Bake, PT, DPT 01/03/2017 Pager: [phone]  York Ram E 01/03/2017, 4:04 PM

## 2017-01-03 NOTE — Progress Notes (Signed)
Inpatient Diabetes Program Recommendations  AACE/ADA: New Consensus Statement on Inpatient Glycemic Control (2015)  Target Ranges:  Prepandial:   less than 140 mg/dL      Peak postprandial:   less than 180 mg/dL (1-2 hours)      Critically ill patients:  140 - 180 mg/dL   Lab Results  Component Value Date   GLUCAP 151 (H) 01/03/2017   HGBA1C 10.2 (H) 08/25/2015    Review of Glycemic Control  Diabetes history: DM2 Outpatient Diabetes medications: Novolog 70/30 mix 27 units ac breakfast + 20 units ac supper Current orders for Inpatient glycemic control: Novolog 70/30 mix 10 units bid + Moderate correction  Inpatient Diabetes Program Recommendations:  Spoke with patient's daughter to clarify home insulin dose. Patient used to take Toujeo + Humalog meal coverage but changed to Novolog 70/30 mix using the pen.  While in the hospital with eating varied, please consider: -D/C 70/30 Novolog mix -Levemir 15 units daily  -Novolog 3 units tid meal coverage if eats 50% -Decrease Novolog correction to sensitive -A1c to determine prehospital glycemic control  Will follow during hospitalization.  Thank you, Nani Gasser. Guila Owensby, RN, MSN, CDE  Diabetes Coordinator Inpatient Glycemic Control Team Team Pager [phone] (8am-5pm) 01/03/2017 10:38 AM

## 2017-01-04 DIAGNOSIS — N184 Chronic kidney disease, stage 4 (severe): Secondary | ICD-10-CM

## 2017-01-04 DIAGNOSIS — I2510 Atherosclerotic heart disease of native coronary artery without angina pectoris: Secondary | ICD-10-CM

## 2017-01-04 LAB — CBC
HEMATOCRIT: 24.8 % — AB (ref 36.0–46.0)
HEMOGLOBIN: 7.8 g/dL — AB (ref 12.0–15.0)
MCH: 27.7 pg (ref 26.0–34.0)
MCHC: 31.5 g/dL (ref 30.0–36.0)
MCV: 87.9 fL (ref 78.0–100.0)
Platelets: 187 10*3/uL (ref 150–400)
RBC: 2.82 MIL/uL — AB (ref 3.87–5.11)
RDW: 14.1 % (ref 11.5–15.5)
WBC: 5.6 10*3/uL (ref 4.0–10.5)

## 2017-01-04 LAB — GLUCOSE, CAPILLARY
GLUCOSE-CAPILLARY: 114 mg/dL — AB (ref 65–99)
GLUCOSE-CAPILLARY: 223 mg/dL — AB (ref 65–99)
GLUCOSE-CAPILLARY: 211 mg/dL — AB (ref 65–99)
Glucose-Capillary: 165 mg/dL — ABNORMAL HIGH (ref 65–99)
Glucose-Capillary: 190 mg/dL — ABNORMAL HIGH (ref 65–99)

## 2017-01-04 LAB — RENAL FUNCTION PANEL
ALBUMIN: 2.8 g/dL — AB (ref 3.5–5.0)
Anion gap: 6 (ref 5–15)
BUN: 37 mg/dL — AB (ref 6–20)
CALCIUM: 8.3 mg/dL — AB (ref 8.9–10.3)
CHLORIDE: 107 mmol/L (ref 101–111)
CO2: 28 mmol/L (ref 22–32)
CREATININE: 2.31 mg/dL — AB (ref 0.44–1.00)
GFR, EST AFRICAN AMERICAN: 22 mL/min — AB (ref >60)
GFR, EST NON AFRICAN AMERICAN: 19 mL/min — AB (ref >60)
Glucose, Bld: 104 mg/dL — ABNORMAL HIGH (ref 65–99)
PHOSPHORUS: 3.1 mg/dL (ref 2.5–4.6)
Potassium: 4.2 mmol/L (ref 3.5–5.1)
Sodium: 141 mmol/L (ref 135–145)

## 2017-01-04 LAB — PARATHYROID HORMONE, INTACT (NO CA): PTH: 50 pg/mL (ref 15–65)

## 2017-01-04 MED ORDER — DARBEPOETIN ALFA 150 MCG/0.3ML IJ SOSY
150.0000 ug | PREFILLED_SYRINGE | INTRAMUSCULAR | Status: DC
  Administered 2017-01-04: 150 ug via SUBCUTANEOUS
  Filled 2017-01-04: qty 0.3

## 2017-01-04 MED ORDER — ISOSORB DINITRATE-HYDRALAZINE 20-37.5 MG PO TABS
1.0000 | ORAL_TABLET | Freq: Three times a day (TID) | ORAL | Status: DC
  Administered 2017-01-04 – 2017-01-05 (×4): 1 via ORAL
  Filled 2017-01-04 (×5): qty 1

## 2017-01-04 MED ORDER — SODIUM CHLORIDE 0.9 % IV SOLN
510.0000 mg | Freq: Once | INTRAVENOUS | Status: AC
  Administered 2017-01-04: 510 mg via INTRAVENOUS
  Filled 2017-01-04: qty 17

## 2017-01-04 MED ORDER — FUROSEMIDE 40 MG PO TABS
160.0000 mg | ORAL_TABLET | Freq: Three times a day (TID) | ORAL | Status: DC
  Administered 2017-01-04 – 2017-01-06 (×6): 160 mg via ORAL
  Filled 2017-01-04 (×6): qty 4

## 2017-01-04 MED ORDER — HEPARIN SODIUM (PORCINE) 5000 UNIT/ML IJ SOLN
5000.0000 [IU] | Freq: Three times a day (TID) | INTRAMUSCULAR | Status: DC
  Administered 2017-01-05: 5000 [IU] via SUBCUTANEOUS
  Filled 2017-01-04: qty 1

## 2017-01-04 NOTE — Progress Notes (Signed)
Inpatient Diabetes Program Recommendations  AACE/ADA: New Consensus Statement on Inpatient Glycemic Control (2015)  Target Ranges:  Prepandial:   less than 140 mg/dL      Peak postprandial:   less than 180 mg/dL (1-2 hours)      Critically ill patients:  140 - 180 mg/dL   Lab Results  Component Value Date   GLUCAP 114 (H) 01/04/2017   HGBA1C 10.2 (H) 08/25/2015    Review of Glycemic Control Results for Glenda [name], Glenda [name] (MRN [id number]) as of 01/04/2017 09:05  Ref. Range 01/03/2017 08:29 01/03/2017 11:49 01/03/2017 20:47 01/03/2017 22:03 01/04/2017 08:00  Glucose-Capillary Latest Ref Range: 65 - 99 mg/dL 151 (H) 202 (H) 88 119 (H) 114 (H)   Inpatient Diabetes Program Recommendations:   Reviewed CBGs and fasting CBG good on only Levemir 5 units given last hs. -Decrease Levemir to 5  Thank you, Bethena Roys E. Tekla Malachowski, RN, MSN, CDE  Diabetes Coordinator Inpatient Glycemic Control Team Team Pager [phone] (8am-5pm) 01/04/2017 9:06 AM

## 2017-01-04 NOTE — Progress Notes (Signed)
Patient having a nose bleed in left nare. Small amount of bright red blood. Patient reports that it just started bleeding on its own. RN held pressure and applied a gauze compress. Bleeding slowed down. At this point, patient is being left off of oxygen as this more than likely dried her nare out and caused the bleeding. O2 sats are 91-95% on RA. Will continue to monitor.   Othella Boyer [facility] 01/04/2017 4:32 PM

## 2017-01-04 NOTE — Progress Notes (Signed)
PROGRESS NOTE    Glenda [name]  PPJ:[id number] DOB: [birth year] DOA: 01/02/2017 PCP: Alroy Dust, L.Marlou Sa, MD  Brief Narrative: Glenda [name] is a 83 y.o. female  with CKD 4, DM, CAD, HTN, h/o CVA admitted with increasing shortness of breath for the past few days. She reports lower extremity edema bilateral, orthopnea and dyspnea on exertion.  Admitted with AKI on CKD4 with vol overload and anemia, getting lasix, Renal following  Assessment & Plan:     AKI on CKD 4 with volume overload -due to underlying DM and likely cardiorenal component -no significant NSAID use reported, ARB stopped -appreciate Renal consult, now on PO lasix 160mg  bid per Renal -urine output not recorded again -Renal US without hydronephrosis -ECHO with normal EF and grade 3 DD    Acute on chronic diastolic CHF -diurese as above -normal EF and grade 3DD as above   Uncontrolled HTN -improving with diuresis -on clonidine, hydralazine and coreg -will need dose titration as vol status improves  Anemia -multifactorial likely, mainly chronic disease and Iron defi -no overt bleeding -anemia panel notes Iron Defi to get Aranesp and EPo today -check hemoccult -hold Hep SQ today -if trends down further may need transfusion    H/o CVA -continue ASA/plavix/statin -Pt eval    CAD -no ACS, Mild elevated troponin/flat trend- due to demand/renal failure -FU ECHO -continue ASA/Plavix/Coreg    DM on Insulin -change Insulin 70/30 to Levemir and novolog SSI now -FU Hba1c    H/o Breast cancer of upper-outer quadrant of right female breast (HCC) -s/p mastectomy and XRT  DVT prophylaxis: Hep SQ Code Status: daughter at bedside Family Communication: daughter at bedside Disposition Plan: home when adequately diuresed  Consultants:   Cards  Renal   Subjective: Breathing better, peeing less Objective: Vitals:   01/03/17 1434 01/03/17 2043 01/04/17 0639 01/04/17 0734  BP: (!) 174/51 (!) 184/51 (!) 200/69  (!) 179/56  Pulse: (!) 58 (!) 58 63 65  Resp: 19 (!) 22 20   Temp: 97.9 F (36.6 C) 98.6 F (37 C) 98.7 F (37.1 C)   TempSrc: Oral Oral Oral   SpO2: 97% 100%    Weight:      Height:        Intake/Output Summary (Last 24 hours) at 01/04/17 1321 Last data filed at 01/04/17 1255  Gross per 24 hour  Intake              480 ml  Output              751 ml  Net             -271 ml   Filed Weights   01/02/17 1437 01/03/17 0405  Weight: 81.5 kg (179 lb 10.8 oz) 81.3 kg (179 lb 3.7 oz)    Examination:  General exam:Elderly frail female, laying in bed, no distress Respiratory system: decreased BS at bases Cardiovascular system: S1 & S2 heard, RRR. No JVD, murmurs, 2 plus edema noted. Gastrointestinal system: Abdomen is nondistended, soft and nontender. Normal bowel sounds heard. Central nervous system: Alert and oriented. No focal neurological deficits. Extremities: 2-3plus edema Skin: No rashes, lesions or ulcers Psychiatry: Judgement and insight appear normal. Mood & affect appropriate.     Data Reviewed:   CBC:  Recent Labs Lab 01/02/17 0916 01/04/17 0437  WBC 7.7 5.6  NEUTROABS 6.0  --   HGB 9.6* 7.8*  HCT 30.0* 24.8*  MCV 87.2 87.9  PLT 238 580   Basic Metabolic Panel:  Recent Labs Lab 01/02/17 0916 01/03/17 0018 01/04/17 0437  NA 140 142 141  K 3.4* 3.4* 4.2  CL 105 107 107  CO2 26 26 28   GLUCOSE 129* 112* 104*  BUN 34* 35* 37*  CREATININE 2.42* 2.43* 2.31*  CALCIUM 8.3* 8.2* 8.3*  PHOS  --   --  3.1   GFR: Estimated Creatinine Clearance: 20.9 mL/min (A) (by C-G formula based on SCr of 2.31 mg/dL (H)). Liver Function Tests:  Recent Labs Lab 01/02/17 0916 01/04/17 0437  AST 35  --   ALT 15  --   ALKPHOS 124  --   BILITOT 0.6  --   PROT 7.4  --   ALBUMIN 3.3* 2.8*   No results for input(s): LIPASE, AMYLASE in the last 168 hours. No results for input(s): AMMONIA in the last 168 hours. Coagulation Profile: No results for input(s): INR,  PROTIME in the last 168 hours. Cardiac Enzymes:  Recent Labs Lab 01/02/17 0916 01/02/17 1535 01/02/17 2156 01/03/17 0018  TROPONINI 0.03* 0.03* 0.03* 0.04*   BNP (last 3 results) No results for input(s): PROBNP in the last 8760 hours. HbA1C: No results for input(s): HGBA1C in the last 72 hours. CBG:  Recent Labs Lab 01/03/17 1639 01/03/17 2047 01/03/17 2203 01/04/17 0800 01/04/17 1145  GLUCAP 165* 88 119* 114* 190*   Lipid Profile: No results for input(s): CHOL, HDL, LDLCALC, TRIG, CHOLHDL, LDLDIRECT in the last 72 hours. Thyroid Function Tests:  Recent Labs  01/02/17 1535  TSH 1.145   Anemia Panel:  Recent Labs  01/03/17 1321  TIBC 242*  IRON 20*   Urine analysis:    Component Value Date/Time   COLORURINE YELLOW 08/24/2015 [place] 08/24/2015 1824   LABSPEC 1.018 08/24/2015 1824   PHURINE 5.0 08/24/2015 1824   GLUCOSEU 250 (A) 08/24/2015 1824   HGBUR NEGATIVE 08/24/2015 1824   BILIRUBINUR NEGATIVE 08/24/2015 1824   KETONESUR NEGATIVE 08/24/2015 1824   PROTEINUR 100 (A) 08/24/2015 1824   UROBILINOGEN 0.2 07/04/2014 1900   NITRITE NEGATIVE 08/24/2015 1824   LEUKOCYTESUR NEGATIVE 08/24/2015 1824   Sepsis Labs: @LABRCNTIP (procalcitonin:4,lacticidven:4)  )No results found for this or any previous visit (from the past 240 hour(s)).       Radiology Studies: US Renal  Result Date: 01/03/2017 CLINICAL DATA:  Chronic kidney disease EXAM: RENAL / URINARY TRACT ULTRASOUND COMPLETE COMPARISON:  06/25/2016 FINDINGS: Right Kidney: Length: 11 cm. Borderline echogenic cortex. No mass or hydronephrosis visualized. Left Kidney: Length: 11 cm. Borderline echogenic cortex. No mass or hydronephrosis visualized. Bladder: Appears normal for degree of bladder distention. IMPRESSION: Stable renal ultrasound.  No hydronephrosis or atrophy. Electronically Signed   By: Monte Fantasia M.D.   On: 01/03/2017 14:01        Scheduled Meds: . aspirin  81  mg Oral Daily  . carvedilol  12.5 mg Oral BID WC  . citalopram  20 mg Oral Daily  . cloNIDine  0.2 mg Oral BID  . clopidogrel  75 mg Oral Daily  . darbepoetin (ARANESP) injection - NON-DIALYSIS  150 mcg Subcutaneous Q Tue-1800  . furosemide  160 mg Oral TID  . [START ON 01/05/2017] heparin subcutaneous  5,000 Units Subcutaneous Q8H  . insulin aspart  0-15 Units Subcutaneous TID WC  . insulin aspart  0-5 Units Subcutaneous QHS  . insulin aspart  3 Units Subcutaneous TID WC  . insulin detemir  15 Units Subcutaneous QHS  . isosorbide-hydrALAZINE  1 tablet Oral TID  . pravastatin  80 mg Oral Daily  . sodium chloride flush  3 mL Intravenous Q12H   Continuous Infusions: . sodium chloride       LOS: 2 days    Time spent: 47min    Domenic Polite, MD [facility] Pager [phone]  If 7PM-7AM, please contact night-coverage [email or web address] Password [email or web address] 01/04/2017, 1:21 PM

## 2017-01-04 NOTE — Progress Notes (Signed)
Patient's blood sugar 88 at bedtime. Patient was encouraged to eat a snack but had an episode of vomiting and was unable to finish snack. Patient was given some ginger ale instead. MD on call was notified of blood sugar and Levimer dose was decreased to 5 units for night time order times one. Will continue to monitor patient.

## 2017-01-04 NOTE — Progress Notes (Signed)
Subjective:  Patient denies any chest pain states breathing has improved up in chair. Denies any obvious bleeding noted to have significant drop in hemoglobin. Results of 2-D echo noted with normal wall motion and diastolic dysfunction  Objective:  Vital Signs in the last 24 hours: Temp:  [97.9 F (36.6 C)-98.7 F (37.1 C)] 98.7 F (37.1 C) (09/11 0639) Pulse Rate:  [58-65] 65 (09/11 0734) Resp:  [19-22] 20 (09/11 0639) BP: (174-200)/(51-69) 179/56 (09/11 0734) SpO2:  [97 %-100 %] 100 % (09/10 2043)  Intake/Output from previous day: 09/10 0701 - 09/11 0700 In: 585 [P.O.:480; I.V.:45; IV Piggyback:60] Out: 751 [Urine:750; Stool:1] Intake/Output from this shift: No intake/output data recorded.  Physical Exam: Neck: no adenopathy, no carotid bruit, no JVD and supple, symmetrical, trachea midline Lungs: Decreased breath sound at bases with faint rales noted Heart: regular rate and rhythm, S1, S2 normal and Soft systolic murmur noted Abdomen: soft, non-tender; bowel sounds normal; no masses,  no organomegaly Extremities: No clubbing cyanosis 1+ edema noted  Lab Results:  Recent Labs  01/02/17 0916 01/04/17 0437  WBC 7.7 5.6  HGB 9.6* 7.8*  PLT 238 187    Recent Labs  01/03/17 0018 01/04/17 0437  NA 142 141  K 3.4* 4.2  CL 107 107  CO2 26 28  GLUCOSE 112* 104*  BUN 35* 37*  CREATININE 2.43* 2.31*    Recent Labs  01/02/17 2156 01/03/17 0018  TROPONINI 0.03* 0.04*   Hepatic Function Panel  Recent Labs  01/02/17 0916 01/04/17 0437  PROT 7.4  --   ALBUMIN 3.3* 2.8*  AST 35  --   ALT 15  --   ALKPHOS 124  --   BILITOT 0.6  --    No results for input(s): CHOL in the last 72 hours. No results for input(s): PROTIME in the last 72 hours.  Imaging: Imaging results have been reviewed and US Renal  Result Date: 01/03/2017 CLINICAL DATA:  Chronic kidney disease EXAM: RENAL / URINARY TRACT ULTRASOUND COMPLETE COMPARISON:  06/25/2016 FINDINGS: Right Kidney:  Length: 11 cm. Borderline echogenic cortex. No mass or hydronephrosis visualized. Left Kidney: Length: 11 cm. Borderline echogenic cortex. No mass or hydronephrosis visualized. Bladder: Appears normal for degree of bladder distention. IMPRESSION: Stable renal ultrasound.  No hydronephrosis or atrophy. Electronically Signed   By: Monte Fantasia M.D.   On: 01/03/2017 14:01    Cardiac Studies:  Assessment/Plan:  Resolving decompensated congestive heart failure secondary to preserved LV systolic function Coronary artery disease. Hypertension. Diabetes mellitus. Acute on chronic kidney disease. History of CVA. Morbid obesity. Hyperlipidemia. Acute on chronic anemia rule out GI loss Plan Discussed with patient and her daughter her various options of treatment i.e. medical versus nuclear stress test when fully compensated, agreed with medical management only in view of her activity is limited with renal insufficiency and now anemia. Anemia workup per primary team Change hydralazine to BiDil as per orders  LOS: 2 days    Charolette Forward 01/04/2017, 9:35 AM

## 2017-01-04 NOTE — Progress Notes (Signed)
Physical Therapy Treatment Patient Details Name: Glenda [name] MRN: [id number] DOB: 09-23-1933 Today's Date: 01/04/2017    History of Present Illness 83 y.o. female with PMHx significant for CKD 4, DM, CAD, HTN, CVA, breast cancer and admitted with increasing shortness of breath, lower extremity edema bilateral, orthopnea and dyspnea on exertion; diagnosed with AKI on CKD 4 with volume overload and acute on chronic CHF    PT Comments    Pt ambulated in hallway with RW and required a little steadying assist today.  SpO2 89-90% on room air during ambulation.  Follow Up Recommendations  Home health PT;Supervision/Assistance - 24 hour     Equipment Recommendations  None recommended by PT    Recommendations for Other Services       Precautions / Restrictions Precautions Precautions: Fall Precaution Comments: monitor sats    Mobility  Bed Mobility               General bed mobility comments: pt up in recliner on arrival  Transfers Overall transfer level: Needs assistance Equipment used: Rolling walker (2 wheeled) Transfers: Sit to/from Stand Sit to Stand: Min guard         General transfer comment: slow transfers however no physical assist required  Ambulation/Gait Ambulation/Gait assistance: Min assist;Min guard Ambulation Distance (Feet): 80 Feet Assistive device: Rolling walker (2 wheeled) Gait Pattern/deviations: Step-through pattern;Decreased stride length     General Gait Details: verbal cues for RW positioning, gait appears unsteady mostly due to R LE residual deficits from previous CVA per pt, required steadying assist today with sock caught under RW post, SpO2 89-90% on room air during ambulation, returned to room and reapplied 2L O2 [place]   Stairs            Wheelchair Mobility    Modified Rankin (Stroke Patients Only)       Balance                                            Cognition Arousal/Alertness:  Awake/alert Behavior During Therapy: WFL for tasks assessed/performed Overall Cognitive Status: Within Functional Limits for tasks assessed                                        Exercises      General Comments        Pertinent Vitals/Pain Pain Assessment: No/denies pain    Home Living                      Prior Function            PT Goals (current goals can now be found in the care plan section) Progress towards PT goals: Progressing toward goals    Frequency    Min 3X/week      PT Plan Current plan remains appropriate    Co-evaluation              AM-PAC PT "6 Clicks" Daily Activity  Outcome Measure  Difficulty turning over in bed (including adjusting bedclothes, sheets and blankets)?: None Difficulty moving from lying on back to sitting on the side of the bed? : A Little Difficulty sitting down on and standing up from a chair with arms (e.g., wheelchair, bedside commode, etc,.)?: A Lot Help needed moving to  and from a bed to chair (including a wheelchair)?: A Little Help needed walking in hospital room?: A Little Help needed climbing 3-5 steps with a railing? : A Little 6 Click Score: 18    End of Session Equipment Utilized During Treatment: Gait belt;Oxygen Activity Tolerance: Patient tolerated treatment well Patient left: in chair;with call bell/phone within reach;with family/visitor present   PT Visit Diagnosis: Difficulty in walking, not elsewhere classified (R26.2)     Time: 8864-8472 PT Time Calculation (min) (ACUTE ONLY): 13 min  Charges:  $Gait Training: 8-22 mins                    G Codes:      Carmelia Bake, PT, DPT 01/04/2017 Pager: [phone]  York Ram E 01/04/2017, 12:36 PM

## 2017-01-04 NOTE — Care Management Note (Signed)
Case Management Note  Patient Details  Name: Glenda [name] MRN: [id number] Date of Birth: 1933/07/01  Subjective/Objective: PT recc HHPT-[facility] will provide HHC-will fax order once placed to [facility] fax#[phone].Will monitor on 02.                   Action/Plan:d/c home w/HHC-through [facility].   Expected Discharge Date:                  Expected Discharge Plan:  [facility]  In-House Referral:     Discharge planning Services  CM Consult  Post Acute Care Choice:  Resumption of Svcs/PTA Provider, Home Health ([facility]-HHPT/CSW/RN) Choice offered to:     DME Arranged:    DME Agency:     HH Arranged:    HH Agency:     Status of Service:  In process, will continue to follow  If discussed at Long Length of Stay Meetings, dates discussed:    Additional Comments:  Dessa Phi, RN 01/04/2017, 12:42 PM

## 2017-01-04 NOTE — Progress Notes (Signed)
Subjective: Interval History: has no complaint, making much more urine.  Objective: Vital signs in last 24 hours: Temp:  [97.9 F (36.6 C)-98.7 F (37.1 C)] 98.7 F (37.1 C) (09/11 0639) Pulse Rate:  [58-65] 65 (09/11 0734) Resp:  [19-22] 20 (09/11 0639) BP: (174-200)/(51-69) 179/56 (09/11 0734) SpO2:  [97 %-100 %] 100 % (09/10 2043) Weight change:   Intake/Output from previous day: 09/10 0701 - 09/11 0700 In: 585 [P.O.:480; I.V.:45; IV Piggyback:60] Out: 751 [Urine:750; Stool:1] Intake/Output this shift: No intake/output data recorded.  General appearance: alert, cooperative, no distress and mildly obese Resp: diminished breath sounds bilaterally and rales bibasilar Cardio: S1, S2 normal and systolic murmur: systolic ejection 2/6, crescendo and decrescendo at 2nd left intercostal space GI: obese, pos bs, liver down 6 cm Extremities: edema 2+  Lab Results:  Recent Labs  01/02/17 0916 01/04/17 0437  WBC 7.7 5.6  HGB 9.6* 7.8*  HCT 30.0* 24.8*  PLT 238 187   BMET:  Recent Labs  01/03/17 0018 01/04/17 0437  NA 142 141  K 3.4* 4.2  CL 107 107  CO2 26 28  GLUCOSE 112* 104*  BUN 35* 37*  CREATININE 2.43* 2.31*  CALCIUM 8.2* 8.3*    Recent Labs  01/03/17 1321  PTH 50   Iron Studies:  Recent Labs  01/03/17 1321  IRON 20*  TIBC 242*    Studies/Results: US Renal  Result Date: 01/03/2017 CLINICAL DATA:  Chronic kidney disease EXAM: RENAL / URINARY TRACT ULTRASOUND COMPLETE COMPARISON:  06/25/2016 FINDINGS: Right Kidney: Length: 11 cm. Borderline echogenic cortex. No mass or hydronephrosis visualized. Left Kidney: Length: 11 cm. Borderline echogenic cortex. No mass or hydronephrosis visualized. Bladder: Appears normal for degree of bladder distention. IMPRESSION: Stable renal ultrasound.  No hydronephrosis or atrophy. Electronically Signed   By: Monte Fantasia M.D.   On: 01/03/2017 14:01    I have reviewed the patient's current  medications.  Assessment/Plan: 1 CKD4 stable, at baseline.  Urine not recorded.  Says diuresing, still vol xs.  2 HTN vol xs.cont meds, diuretics 3 DM controlled 4 Anemia needs Fe and esa 5 HPTH  Mild 6 CVA 7 Hx breast Ca P Lasix, esa, Fe, ??I&O    LOS: 2 days   Neiva Maenza L 01/04/2017,10:22 AM

## 2017-01-05 DIAGNOSIS — I5033 Acute on chronic diastolic (congestive) heart failure: Secondary | ICD-10-CM

## 2017-01-05 LAB — CBC
HEMATOCRIT: 24.5 % — AB (ref 36.0–46.0)
Hemoglobin: 7.8 g/dL — ABNORMAL LOW (ref 12.0–15.0)
MCH: 28.1 pg (ref 26.0–34.0)
MCHC: 31.8 g/dL (ref 30.0–36.0)
MCV: 88.1 fL (ref 78.0–100.0)
PLATELETS: 191 10*3/uL (ref 150–400)
RBC: 2.78 MIL/uL — ABNORMAL LOW (ref 3.87–5.11)
RDW: 14 % (ref 11.5–15.5)
WBC: 3.9 10*3/uL — AB (ref 4.0–10.5)

## 2017-01-05 LAB — RENAL FUNCTION PANEL
Albumin: 2.8 g/dL — ABNORMAL LOW (ref 3.5–5.0)
Anion gap: 9 (ref 5–15)
BUN: 37 mg/dL — AB (ref 6–20)
CHLORIDE: 100 mmol/L — AB (ref 101–111)
CO2: 29 mmol/L (ref 22–32)
CREATININE: 2.2 mg/dL — AB (ref 0.44–1.00)
Calcium: 8.6 mg/dL — ABNORMAL LOW (ref 8.9–10.3)
GFR calc Af Amer: 23 mL/min — ABNORMAL LOW (ref >60)
GFR, EST NON AFRICAN AMERICAN: 20 mL/min — AB (ref >60)
Glucose, Bld: 129 mg/dL — ABNORMAL HIGH (ref 65–99)
POTASSIUM: 4 mmol/L (ref 3.5–5.1)
Phosphorus: 3.6 mg/dL (ref 2.5–4.6)
Sodium: 138 mmol/L (ref 135–145)

## 2017-01-05 LAB — GLUCOSE, CAPILLARY
GLUCOSE-CAPILLARY: 153 mg/dL — AB (ref 65–99)
GLUCOSE-CAPILLARY: 121 mg/dL — AB (ref 65–99)
Glucose-Capillary: 135 mg/dL — ABNORMAL HIGH (ref 65–99)
Glucose-Capillary: 179 mg/dL — ABNORMAL HIGH (ref 65–99)

## 2017-01-05 LAB — OCCULT BLOOD X 1 CARD TO LAB, STOOL: FECAL OCCULT BLD: POSITIVE — AB

## 2017-01-05 MED ORDER — NEPRO/CARBSTEADY PO LIQD
237.0000 mL | Freq: Two times a day (BID) | ORAL | Status: DC
  Administered 2017-01-05 – 2017-01-06 (×2): 237 mL via ORAL
  Filled 2017-01-05 (×4): qty 237

## 2017-01-05 MED ORDER — ISOSORB DINITRATE-HYDRALAZINE 20-37.5 MG PO TABS
2.0000 | ORAL_TABLET | Freq: Three times a day (TID) | ORAL | Status: DC
  Administered 2017-01-05 – 2017-01-06 (×3): 2 via ORAL
  Filled 2017-01-05 (×5): qty 2

## 2017-01-05 MED ORDER — INFLUENZA VAC SPLIT HIGH-DOSE 0.5 ML IM SUSY
0.5000 mL | PREFILLED_SYRINGE | INTRAMUSCULAR | Status: AC
  Administered 2017-01-06: 0.5 mL via INTRAMUSCULAR
  Filled 2017-01-05 (×3): qty 0.5

## 2017-01-05 NOTE — Progress Notes (Signed)
Subjective:  Patient denies any chest pain or shortness of breath.  States overall feels much better.  Objective:  Vital Signs in the last 24 hours: Temp:  [98.7 F (37.1 C)-99.1 F (37.3 C)] 98.7 F (37.1 C) (09/12 0507) Pulse Rate:  [61-69] 69 (09/12 0805) Resp:  [19-20] 20 (09/12 0507) BP: (164-184)/(49-64) 170/55 (09/12 0805) SpO2:  [92 %-99 %] 96 % (09/12 0507) Weight:  [81 kg (178 lb 8 oz)-81.6 kg (179 lb 14.3 oz)] 81 kg (178 lb 8 oz) (09/12 0455)  Intake/Output from previous day: 09/11 0701 - 09/12 0700 In: 597 [P.O.:480; IV Piggyback:117] Out: 2200 [Urine:2200] Intake/Output from this shift: Total I/O In: 120 [P.O.:120] Out: 400 [Urine:400]  Physical Exam: Neck: no adenopathy, no carotid bruit, no JVD and supple, symmetrical, trachea midline Lungs: decreased breath sounds at bases with faint rails Heart: regular rate and rhythm, S1, S2 normal and soft systolic murmur noted Abdomen: soft, non-tender; bowel sounds normal; no masses,  no organomegaly Extremities: no clubbing, cyanosis.  Trace edema noted  Lab Results:  Recent Labs  01/04/17 0437 01/05/17 0641  WBC 5.6 3.9*  HGB 7.8* 7.8*  PLT 187 191    Recent Labs  01/04/17 0437 01/05/17 0641  NA 141 138  K 4.2 4.0  CL 107 100*  CO2 28 29  GLUCOSE 104* 129*  BUN 37* 37*  CREATININE 2.31* 2.20*    Recent Labs  01/02/17 2156 01/03/17 0018  TROPONINI 0.03* 0.04*   Hepatic Function Panel  Recent Labs  01/05/17 0641  ALBUMIN 2.8*   No results for input(s): CHOL in the last 72 hours. No results for input(s): PROTIME in the last 72 hours.  Imaging: Imaging results have been reviewed and US Renal  Result Date: 01/03/2017 CLINICAL DATA:  Chronic kidney disease EXAM: RENAL / URINARY TRACT ULTRASOUND COMPLETE COMPARISON:  06/25/2016 FINDINGS: Right Kidney: Length: 11 cm. Borderline echogenic cortex. No mass or hydronephrosis visualized. Left Kidney: Length: 11 cm. Borderline echogenic cortex. No  mass or hydronephrosis visualized. Bladder: Appears normal for degree of bladder distention. IMPRESSION: Stable renal ultrasound.  No hydronephrosis or atrophy. Electronically Signed   By: Monte Fantasia M.D.   On: 01/03/2017 14:01    Cardiac Studies:  Assessment/Plan:  Resolving decompensated congestive heart failure secondary to preserved LV systolic function Coronary artery disease. Hypertension. Diabetes mellitus. Acute on chronic kidney disease. History of CVA. Morbid obesity. Hyperlipidemia. Acute on chronic anemia rule out GI loss Plan Increase BiDil to 2 tablets 3 times daily. Monitor blood pressure regularly  Follow-up with me in 2 weeks  LOS: 3 days    Charolette Forward 01/05/2017, 9:35 AM

## 2017-01-05 NOTE — Care Management Important Message (Signed)
Important Message  Patient Details  Name: Glenda [name] MRN: [id number] Date of Birth: 06/11/1933   Medicare Important Message Given:  Yes    Kerin Salen 01/05/2017, 11:40 AMImportant Message  Patient Details  Name: Glenda [name] MRN: [id number] Date of Birth: 02/29/1934   Medicare Important Message Given:  Yes    Kerin Salen 01/05/2017, 11:40 AM

## 2017-01-05 NOTE — Progress Notes (Signed)
SATURATION QUALIFICATIONS: (This note is used to comply with regulatory documentation for home oxygen)  Patient Saturations on Room Air at Rest = 95%  Patient Saturations on Room Air while Ambulating = 92-95%  Patient Saturations on  Liters of oxygen while Ambulating = %  Please briefly explain why patient needs home oxygen:

## 2017-01-05 NOTE — Progress Notes (Signed)
PROGRESS NOTE    Glenda [name]  XBM:[id number] DOB: 1934-01-14 DOA: 01/02/2017 PCP: Alroy Dust, L.Marlou Sa, MD  Brief Narrative: Glenda [name] is a 83 y.o. female  with CKD 4, DM, CAD, HTN, h/o CVA admitted with increasing shortness of breath for the past few days. She reports lower extremity edema bilateral, orthopnea and dyspnea on exertion.  Admitted with AKI on CKD4 with vol overload and anemia, getting lasix, Renal following  Assessment & Plan:     AKI on CKD 4 with volume overload -due to underlying DM and likely cardiorenal component -no significant NSAID use reported, ARB stopped -appreciate Renal consult, now on PO lasix 160mg  bid per Renal -urine output; 2.2 L last 24 hours.  -Renal US without hydronephrosis -ECHO with normal EF and grade 3 DD -cr stable.     Acute on chronic diastolic CHF -diurese as above -normal EF and grade 3DD as above   Uncontrolled HTN -on clonidine, hydralazine and coreg -started on bidil, change to TID today   Acute hypoxic Respiratory failure. Related to Heart failure.  Need home oxygen evaluation.   Anemia -multifactorial likely, mainly chronic disease and Iron defi -no overt bleeding -on aranesp and received IV iron  -hold Hep SQ  -if trends down further may need transfusion    H/o CVA -continue ASA/plavix/statin -Pt eval    CAD -no ACS, Mild elevated troponin/flat trend- due to demand/renal failure -FU ECHO -continue ASA/Plavix/Coreg    DM on Insulin -change Insulin 70/30 to Levemir and novolog SSI now -FU Hba1c    H/o Breast cancer of upper-outer quadrant of right female breast (HCC) -s/p mastectomy and XRT  DVT prophylaxis: Hep SQ Code Status: daughter at bedside Family Communication: daughter at bedside Disposition Plan: home when adequately diuresed  Consultants:   Cards  Renal   Subjective: Dyspnea has improved.  Not eating because does not have appetite.  Denies dysphagia, or odynophagia      Objective: Vitals:   01/05/17 0639 01/05/17 0805 01/05/17 1349 01/05/17 1529  BP: (!) 189/59 (!) 170/55 (!) 149/43   Pulse:  69 (!) 58   Resp:   19   Temp:   98.4 F (36.9 C)   TempSrc:   Oral   SpO2:   95% 94%  Weight:      Height:        Intake/Output Summary (Last 24 hours) at 01/05/17 1629 Last data filed at 01/05/17 1525  Gross per 24 hour  Intake              480 ml  Output             2300 ml  Net            -1820 ml   Filed Weights   01/03/17 0405 01/04/17 2027 01/05/17 0455  Weight: 81.3 kg (179 lb 3.7 oz) 81.6 kg (179 lb 14.3 oz) 81 kg (178 lb 8 oz)    Examination:  General exam:No Acute distress.  Respiratory system: few crackles.  Cardiovascular system: S 1, S 2 RRR Gastrointestinal system: soft, nt, nd Central nervous system: non focal.  Extremities: trace edema Skin: No rashes, lesions or ulcers     Data Reviewed:   CBC:  Recent Labs Lab 01/02/17 0916 01/04/17 0437 01/05/17 0641  WBC 7.7 5.6 3.9*  NEUTROABS 6.0  --   --   HGB 9.6* 7.8* 7.8*  HCT 30.0* 24.8* 24.5*  MCV 87.2 87.9 88.1  PLT 238 187 027   Basic Metabolic  Panel:  Recent Labs Lab 01/02/17 0916 01/03/17 0018 01/04/17 0437 01/05/17 0641  NA 140 142 141 138  K 3.4* 3.4* 4.2 4.0  CL 105 107 107 100*  CO2 26 26 28 29   GLUCOSE 129* 112* 104* 129*  BUN 34* 35* 37* 37*  CREATININE 2.42* 2.43* 2.31* 2.20*  CALCIUM 8.3* 8.2* 8.3* 8.6*  PHOS  --   --  3.1 3.6   GFR: Estimated Creatinine Clearance: 21.9 mL/min (A) (by C-G formula based on SCr of 2.2 mg/dL (H)). Liver Function Tests:  Recent Labs Lab 01/02/17 0916 01/04/17 0437 01/05/17 0641  AST 35  --   --   ALT 15  --   --   ALKPHOS 124  --   --   BILITOT 0.6  --   --   PROT 7.4  --   --   ALBUMIN 3.3* 2.8* 2.8*   No results for input(s): LIPASE, AMYLASE in the last 168 hours. No results for input(s): AMMONIA in the last 168 hours. Coagulation Profile: No results for input(s): INR, PROTIME in the last  168 hours. Cardiac Enzymes:  Recent Labs Lab 01/02/17 0916 01/02/17 1535 01/02/17 2156 01/03/17 0018  TROPONINI 0.03* 0.03* 0.03* 0.04*   BNP (last 3 results) No results for input(s): PROBNP in the last 8760 hours. HbA1C: No results for input(s): HGBA1C in the last 72 hours. CBG:  Recent Labs Lab 01/04/17 1720 01/04/17 2024 01/05/17 0730 01/05/17 1204 01/05/17 1605  GLUCAP 223* 211* 135* 179* 153*   Lipid Profile: No results for input(s): CHOL, HDL, LDLCALC, TRIG, CHOLHDL, LDLDIRECT in the last 72 hours. Thyroid Function Tests: No results for input(s): TSH, T4TOTAL, FREET4, T3FREE, THYROIDAB in the last 72 hours. Anemia Panel:  Recent Labs  01/03/17 1321  TIBC 242*  IRON 20*   Urine analysis:    Component Value Date/Time   COLORURINE YELLOW 08/24/2015 1824   APPEARANCEUR CLEAR 08/24/2015 1824   LABSPEC 1.018 08/24/2015 1824   PHURINE 5.0 08/24/2015 1824   GLUCOSEU 250 (A) 08/24/2015 1824   HGBUR NEGATIVE 08/24/2015 1824   BILIRUBINUR NEGATIVE 08/24/2015 1824   KETONESUR NEGATIVE 08/24/2015 1824   PROTEINUR 100 (A) 08/24/2015 1824   UROBILINOGEN 0.2 07/04/2014 1900   NITRITE NEGATIVE 08/24/2015 1824   LEUKOCYTESUR NEGATIVE 08/24/2015 1824   Sepsis Labs: @LABRCNTIP (procalcitonin:4,lacticidven:4)  )No results found for this or any previous visit (from the past 240 hour(s)).       Radiology Studies: No results found.      Scheduled Meds: . aspirin  81 mg Oral Daily  . carvedilol  12.5 mg Oral BID WC  . citalopram  20 mg Oral Daily  . cloNIDine  0.2 mg Oral BID  . clopidogrel  75 mg Oral Daily  . darbepoetin (ARANESP) injection - NON-DIALYSIS  150 mcg Subcutaneous Q Tue-1800  . feeding supplement (NEPRO CARB STEADY)  237 mL Oral BID BM  . furosemide  160 mg Oral TID  . heparin subcutaneous  5,000 Units Subcutaneous Q8H  . insulin aspart  0-15 Units Subcutaneous TID WC  . insulin aspart  0-5 Units Subcutaneous QHS  . insulin aspart  3 Units  Subcutaneous TID WC  . insulin detemir  15 Units Subcutaneous QHS  . isosorbide-hydrALAZINE  2 tablet Oral TID  . pravastatin  80 mg Oral Daily  . sodium chloride flush  3 mL Intravenous Q12H   Continuous Infusions: . sodium chloride       LOS: 3 days    Time spent:  21min    Domenic Polite, MD [facility] Pager [phone]  If 7PM-7AM, please contact night-coverage [email or web address] Password [facility] 01/05/2017, 4:29 PM

## 2017-01-05 NOTE — Progress Notes (Signed)
Physical Therapy Treatment Patient Details Name: Glenda [name] MRN: [id number] DOB: [birth year] Today's Date: 01/05/2017    History of Present Illness 83 y.o. female with PMHx significant for CKD 4, DM, CAD, HTN, CVA, breast cancer and admitted with increasing shortness of breath, lower extremity edema bilateral, orthopnea and dyspnea on exertion; diagnosed with AKI on CKD 4 with volume overload and acute on chronic CHF    PT Comments    Pt assisted with ambulating in hallway and SpO2 92-95% on room air during gait.  Pt left off oxygen and RN notified and agreeable.   Follow Up Recommendations  Home health PT;Supervision/Assistance - 24 hour     Equipment Recommendations  None recommended by PT    Recommendations for Other Services       Precautions / Restrictions Precautions Precautions: Fall Precaution Comments: monitor sats    Mobility  Bed Mobility               General bed mobility comments: pt up in recliner on arrival  Transfers Overall transfer level: Needs assistance Equipment used: Rolling walker (2 wheeled) Transfers: Sit to/from Stand Sit to Stand: Min guard         General transfer comment: slow transfers however no physical assist required; transfer appeared more difficult to perform today so provided increased verbal cues for self assist  Ambulation/Gait Ambulation/Gait assistance: Min guard Ambulation Distance (Feet): 100 Feet Assistive device: Rolling walker (2 wheeled) Gait Pattern/deviations: Step-through pattern;Decreased stride length     General Gait Details: verbal cues for RW positioning, improved use of R LE today (R LE residual deficits from previous CVA), pt reports increased fatigue, SpO2 92-95% on room air during ambulation, left 1L O2 [place] off upon leaving room (RN aware and agreeable)   Stairs            Wheelchair Mobility    Modified Rankin (Stroke Patients Only)       Balance                                             Cognition Arousal/Alertness: Awake/alert Behavior During Therapy: WFL for tasks assessed/performed Overall Cognitive Status: Within Functional Limits for tasks assessed                                        Exercises      General Comments        Pertinent Vitals/Pain Pain Assessment: No/denies pain    Home Living                      Prior Function            PT Goals (current goals can now be found in the care plan section) Progress towards PT goals: Progressing toward goals    Frequency    Min 3X/week      PT Plan Current plan remains appropriate    Co-evaluation              AM-PAC PT "6 Clicks" Daily Activity  Outcome Measure  Difficulty turning over in bed (including adjusting bedclothes, sheets and blankets)?: None Difficulty moving from lying on back to sitting on the side of the bed? : A Little Difficulty sitting down on and standing up from a  chair with arms (e.g., wheelchair, bedside commode, etc,.)?: A Little Help needed moving to and from a bed to chair (including a wheelchair)?: A Little Help needed walking in hospital room?: A Little Help needed climbing 3-5 steps with a railing? : A Little 6 Click Score: 19    End of Session Equipment Utilized During Treatment: Gait belt Activity Tolerance: Patient tolerated treatment well Patient left: in chair;with call bell/phone within reach;with family/visitor present Nurse Communication: Mobility status PT Visit Diagnosis: Difficulty in walking, not elsewhere classified (R26.2)     Time: 1346-1400 PT Time Calculation (min) (ACUTE ONLY): 14 min  Charges:  $Gait Training: 8-22 mins                    G Codes:       Carmelia Bake, PT, DPT 01/05/2017 Pager: [phone]   York Ram E 01/05/2017, 2:35 PM

## 2017-01-05 NOTE — Progress Notes (Signed)
Subjective: Interval History: has no complaint, breathing much better..  Objective: Vital signs in last 24 hours: Temp:  [98.7 F (37.1 C)-99.1 F (37.3 C)] 98.7 F (37.1 C) (09/12 0507) Pulse Rate:  [61-69] 69 (09/12 0805) Resp:  [19-20] 20 (09/12 0507) BP: (164-189)/(49-64) 170/55 (09/12 0805) SpO2:  [92 %-99 %] 96 % (09/12 0507) Weight:  [81 kg (178 lb 8 oz)-81.6 kg (179 lb 14.3 oz)] 81 kg (178 lb 8 oz) (09/12 0455) Weight change:   Intake/Output from previous day: 09/11 0701 - 09/12 0700 In: 597 [P.O.:480; IV Piggyback:117] Out: 2200 [Urine:2200] Intake/Output this shift: Total I/O In: 120 [P.O.:120] Out: 400 [Urine:400]  General appearance: alert, cooperative, no distress and moderately obese Resp: diminished breath sounds bilaterally and rales bibasilar Cardio: S1, S2 normal and systolic murmur: systolic ejection 2/6, crescendo and decrescendo at 2nd left intercostal space GI: obese, pos bs, liver down 5 cm Extremities: edema 1-2+  Lab Results:  Recent Labs  01/04/17 0437 01/05/17 0641  WBC 5.6 3.9*  HGB 7.8* 7.8*  HCT 24.8* 24.5*  PLT 187 191   BMET:  Recent Labs  01/04/17 0437 01/05/17 0641  NA 141 138  K 4.2 4.0  CL 107 100*  CO2 28 29  GLUCOSE 104* 129*  BUN 37* 37*  CREATININE 2.31* 2.20*  CALCIUM 8.3* 8.6*    Recent Labs  01/03/17 1321  PTH 50   Iron Studies:  Recent Labs  01/03/17 1321  IRON 20*  TIBC 242*    Studies/Results: US Renal  Result Date: 01/03/2017 CLINICAL DATA:  Chronic kidney disease EXAM: RENAL / URINARY TRACT ULTRASOUND COMPLETE COMPARISON:  06/25/2016 FINDINGS: Right Kidney: Length: 11 cm. Borderline echogenic cortex. No mass or hydronephrosis visualized. Left Kidney: Length: 11 cm. Borderline echogenic cortex. No mass or hydronephrosis visualized. Bladder: Appears normal for degree of bladder distention. IMPRESSION: Stable renal ultrasound.  No hydronephrosis or atrophy. Electronically Signed   By: Monte Fantasia  M.D.   On: 01/03/2017 14:01    I have reviewed the patient's current medications.  Assessment/Plan: 1 CKD4 diuresing, vol improved, bp improving 2 HTN improving with bidil, lasix 3 DM controlled 4 anemia esa/Fe given 5 HPTH vit D 6 CVA 7 obesity P Lasix, bp med, gluc control,  Can d/c home in 1-2 d    LOS: 3 days   Glenda [name] 01/05/2017,11:21 AM

## 2017-01-06 DIAGNOSIS — I509 Heart failure, unspecified: Secondary | ICD-10-CM

## 2017-01-06 LAB — RENAL FUNCTION PANEL
ANION GAP: 8 (ref 5–15)
Albumin: 2.7 g/dL — ABNORMAL LOW (ref 3.5–5.0)
BUN: 37 mg/dL — ABNORMAL HIGH (ref 6–20)
CALCIUM: 8.6 mg/dL — AB (ref 8.9–10.3)
CHLORIDE: 103 mmol/L (ref 101–111)
CO2: 31 mmol/L (ref 22–32)
CREATININE: 2.14 mg/dL — AB (ref 0.44–1.00)
GFR calc Af Amer: 24 mL/min — ABNORMAL LOW (ref >60)
GFR, EST NON AFRICAN AMERICAN: 21 mL/min — AB (ref >60)
Glucose, Bld: 67 mg/dL (ref 65–99)
Phosphorus: 4 mg/dL (ref 2.5–4.6)
Potassium: 3.6 mmol/L (ref 3.5–5.1)
Sodium: 142 mmol/L (ref 135–145)

## 2017-01-06 LAB — GLUCOSE, CAPILLARY
GLUCOSE-CAPILLARY: 155 mg/dL — AB (ref 65–99)
GLUCOSE-CAPILLARY: 336 mg/dL — AB (ref 65–99)
Glucose-Capillary: 69 mg/dL (ref 65–99)

## 2017-01-06 LAB — HEMOGLOBIN AND HEMATOCRIT, BLOOD
HEMATOCRIT: 25.2 % — AB (ref 36.0–46.0)
Hemoglobin: 8.1 g/dL — ABNORMAL LOW (ref 12.0–15.0)

## 2017-01-06 MED ORDER — CARVEDILOL 12.5 MG PO TABS: 12.5000 mg | ORAL_TABLET | Freq: Two times a day (BID) | ORAL | 0 refills | Status: DC

## 2017-01-06 MED ORDER — AMLODIPINE BESYLATE 10 MG PO TABS
10.0000 mg | ORAL_TABLET | Freq: Every day | ORAL | Status: DC
  Administered 2017-01-06: 10 mg via ORAL
  Filled 2017-01-06: qty 1

## 2017-01-06 MED ORDER — CARVEDILOL 12.5 MG PO TABS: 12.5000 mg | ORAL_TABLET | Freq: Two times a day (BID) | ORAL | Status: DC

## 2017-01-06 MED ORDER — ISOSORB DINITRATE-HYDRALAZINE 20-37.5 MG PO TABS: 2.0000 | ORAL_TABLET | Freq: Three times a day (TID) | ORAL | 0 refills | Status: DC

## 2017-01-06 MED ORDER — INSULIN DETEMIR 100 UNIT/ML ~~LOC~~ SOLN: 12.0000 [IU] | Freq: Every day | SUBCUTANEOUS | 11 refills | Status: DC

## 2017-01-06 MED ORDER — FUROSEMIDE 80 MG PO TABS: 160.0000 mg | ORAL_TABLET | Freq: Three times a day (TID) | ORAL | 0 refills | Status: DC

## 2017-01-06 MED ORDER — CARVEDILOL 25 MG PO TABS: 25.0000 mg | ORAL_TABLET | Freq: Two times a day (BID) | ORAL | Status: DC

## 2017-01-06 MED ORDER — INSULIN DETEMIR 100 UNIT/ML ~~LOC~~ SOLN
12.0000 [IU] | Freq: Every day | SUBCUTANEOUS | Status: DC
  Filled 2017-01-06: qty 0.12

## 2017-01-06 MED ORDER — NEPRO/CARBSTEADY PO LIQD
237.0000 mL | Freq: Two times a day (BID) | ORAL | 0 refills | Status: DC
Start: 2017-01-06 — End: 2017-10-07

## 2017-01-06 MED ORDER — CARVEDILOL 25 MG PO TABS: 25.0000 mg | ORAL_TABLET | Freq: Two times a day (BID) | ORAL | 0 refills | Status: DC

## 2017-01-06 NOTE — Progress Notes (Signed)
Subjective: Interval History: has no complaint, wants to go home, breathing better.  Objective: Vital signs in last 24 hours: Temp:  [98.4 F (36.9 C)-98.7 F (37.1 C)] 98.6 F (37 C) (09/13 0525) Pulse Rate:  [58-70] 70 (09/13 0525) Resp:  [18-19] 18 (09/13 0525) BP: (149-185)/(37-51) 185/51 (09/13 0525) SpO2:  [93 %-95 %] 95 % (09/13 0525) Weight:  [79.2 kg (174 lb 11.2 oz)] 79.2 kg (174 lb 11.2 oz) (09/13 0857) Weight change:   Intake/Output from previous day: 09/12 0701 - 09/13 0700 In: 240 [P.O.:240] Out: 1400 [Urine:1400] Intake/Output this shift: Total I/O In: 240 [P.O.:240] Out: 600 [Urine:600]  General appearance: alert, cooperative, no distress, mildly obese and sats good on RA Resp: diminished breath sounds bilaterally and wheezes bibasilar Cardio: S1, S2 normal and systolic murmur: systolic ejection 2/6, crescendo and decrescendo at 2nd left intercostal space GI: obese, liver down 4 cm, pos bs Extremities: edema 1+  Lab Results:  Recent Labs  01/04/17 0437 01/05/17 0641 01/06/17 0924  WBC 5.6 3.9*  --   HGB 7.8* 7.8* 8.1*  HCT 24.8* 24.5* 25.2*  PLT 187 191  --    BMET:  Recent Labs  01/05/17 0641 01/06/17 0523  NA 138 142  K 4.0 3.6  CL 100* 103  CO2 29 31  GLUCOSE 129* 67  BUN 37* 37*  CREATININE 2.20* 2.14*  CALCIUM 8.6* 8.6*    Recent Labs  01/03/17 1321  PTH 50   Iron Studies:  Recent Labs  01/03/17 1321  IRON 20*  TIBC 242*    Studies/Results: No results found.  I have reviewed the patient's current medications.  Assessment/Plan: 1 CKD 4, vol better diuresing, cont diuretics  Cr at baseline 2 HTN restart Norvasc 3 Anemia given esa, Fe 4 DM controlled 5 Obesity 6 CVA 7 Breast Ca P Lasix po, esa. F/u outpatient with Dr. Hollie Salk in 10-14 D    LOS: 4 days   Cissy Galbreath L 01/06/2017,10:15 AM

## 2017-01-06 NOTE — Discharge Summary (Addendum)
Physician Discharge Summary  Glenda [name] ZHY:[id number] DOB: 10-Sep-1933 DOA: 81/12/2016  PCP: Alroy Dust, L.Marlou Sa, MD  Admit date: 01/02/2017 Discharge date: 01/06/2017  Admitted From: Home  Disposition:  Home   Recommendations for Outpatient Follow-up:  1. Follow up with PCP in 1-2 weeks 2. Please obtain BMP/CBC in one week 3. Needs further titration of BP medications.  4. Monitor renal function.  5. Needs to follow up with Dr Terrence Dupont.    Home Health: yes.   Discharge Condition: Stable.  CODE STATUS: Full code.  Diet recommendation: Heart Healthy / Carb Modified   Brief/Interim Summary: Glenda [name] a 83 y.o.female with CKD 4, DM, CAD, HTN, h/o CVA admitted with increasing shortness of breath for the past few days. She reports lower extremity edema bilateral, orthopnea and dyspnea on exertion.  Admitted with AKI on CKD4 with vol overload and anemia, getting lasix, Renal following  Assessment & Plan:     AKI on CKD 4 with volume overload -due to underlying DM and likely cardiorenal component -no significant NSAID use reported, ARB stopped -appreciate Renal consult, now on PO lasix 160mg  TID. per Renal -urine output; 1.4 L last 24 hours.  -Renal US without hydronephrosis -ECHO with normal EF and grade 3 DD -cr stable.  -renal function stable, no hypoxemia. Plan to discharge patient today.     Acute on chronic diastolic CHF -diurese as above -normal EF and grade 3DD as above   Uncontrolled HTN -on clonidine, hydralazine and coreg -started on bidil, change to TID. -resume norvasc.   Acute hypoxic Respiratory failure. Related to Heart failure.  Need home oxygen evaluation. Sat stable on ambulation.   Anemia -multifactorial likely, mainly chronic disease and Iron defi -no overt bleeding -received aranesp and received IV iron, further doses on follow up with nephrology  -hold Hep SQ  Repeat hb this am, if stable plan to discharge home today.     H/o  CVA -continue ASA/plavix/statin -Pt eval. HH ordered.     CAD -no ACS, Mild elevated troponin/flat trend- due to demand/renal failure -continue ASA/Plavix/Coreg -ECHO normal EF. Diastolic dysfunction.     DM on Insulin -change Insulin 70/30 to Levemir and novolog SSI. I will continue with levemir at discharge due to renal failure. Higher risk of hypoglycemia with 70/30, and patients has been eating less. Will reduce dose to 12 units due to hypoglycemia this morning.      H/o Breast cancer of upper-outer quadrant of right female breast ([place]) -s/p mastectomy and XRT   Discharge Diagnoses:  Principal Problem:   CHF (congestive heart failure) ([place]) Active Problems:   Uncontrolled hypertension   CAD (coronary artery disease)   Acute on chronic diastolic CHF (congestive heart failure) ([facility])   Breast cancer of upper-outer quadrant of right female breast ([place])   Diabetes mellitus type 2 in nonobese ([place])   AKI (acute kidney injury) ([place])   Chronic kidney disease (CKD), stage IV (severe) ([place])    Discharge Instructions  Discharge Instructions    Diet - low sodium heart healthy    Complete by:  As directed    Increase activity slowly    Complete by:  As directed      Allergies as of 01/06/2017   No Known Allergies     Medication List    STOP taking these medications   BYSTOLIC 20 MG Tabs Generic drug:  Nebivolol HCl   hydrALAZINE 50 MG tablet Commonly known as:  APRESOLINE   insulin aspart protamine - aspart (70-30) 100  UNIT/ML FlexPen Commonly known as:  NOVOLOG 70/30 MIX   naproxen sodium 220 MG tablet Commonly known as:  ANAPROX   olmesartan 40 MG tablet Commonly known as:  BENICAR     TAKE these medications   amLODipine 10 MG tablet Commonly known as:  NORVASC Take 10 mg by mouth daily. Reported on 10/31/2015   aspirin 81 MG chewable tablet Chew 81 mg by mouth daily.   carvedilol 12.5 MG tablet Commonly known as:  COREG Take 1 tablet (12.5 mg total)  by mouth 2 (two) times daily with a meal.   citalopram 20 MG tablet Commonly known as:  CELEXA Take 20 mg by mouth daily.   cloNIDine 0.2 MG tablet Commonly known as:  CATAPRES Take 0.2 mg by mouth 2 (two) times daily.   clopidogrel 75 MG tablet Commonly known as:  PLAVIX Take 75 mg by mouth daily.   feeding supplement (NEPRO CARB STEADY) Liqd Take 237 mLs by mouth 2 (two) times daily between meals.   furosemide 80 MG tablet Commonly known as:  LASIX Take 2 tablets (160 mg total) by mouth 3 (three) times daily. What changed:  medication strength  how much to take  when to take this   insulin detemir 100 UNIT/ML injection Commonly known as:  LEVEMIR Inject 0.12 mLs (12 Units total) into the skin at bedtime.   isosorbide-hydrALAZINE 20-37.5 MG tablet Commonly known as:  BIDIL Take 2 tablets by mouth 3 (three) times daily.   pravastatin 80 MG tablet Commonly known as:  PRAVACHOL Take 80 mg by mouth daily.   ranitidine 300 MG tablet Commonly known as:  ZANTAC Take 300 mg by mouth at bedtime.            Discharge Care Instructions        Start     Ordered   01/06/17 0000  Nutritional Supplements (FEEDING SUPPLEMENT, NEPRO CARB STEADY,) LIQD  2 times daily between meals     01/06/17 0934   01/06/17 0000  furosemide (LASIX) 80 MG tablet  3 times daily     01/06/17 0934   01/06/17 0000  insulin detemir (LEVEMIR) 100 UNIT/ML injection  Daily at bedtime     01/06/17 0934   01/06/17 0000  isosorbide-hydrALAZINE (BIDIL) 20-37.5 MG tablet  3 times daily     01/06/17 0934   01/06/17 0000  Increase activity slowly     01/06/17 0934   01/06/17 0000  Diet - low sodium heart healthy     01/06/17 0934   01/06/17 0000  carvedilol (COREG) 12.5 MG tablet  2 times daily with meals     01/06/17 0959     Follow-up Information    [facility] Follow up.   Why:  [place] [facility]nursing Contact information: [address] [place], [place] [address] tel#[phone]       Charolette Forward, MD Follow up in 2 week(s).   Specialty:  Cardiology Contact information: [place] [address] [phone]        Mauricia Area, MD Follow up in 1 week(s).   Specialty:  Nephrology Contact information: [place] [address] [phone]          No Known Allergies  Consultations:  Nephrology  Cardiology    Procedures/Studies: Dg Chest 2 View  Result Date: 01/02/2017 CLINICAL DATA:  Shortness of breath. EXAM: CHEST  2 VIEW COMPARISON:  Radiograph of March 06, 2009. FINDINGS: Borderline cardiomegaly is  noted. Mild central pulmonary vascular congestion is noted. Atherosclerosis of thoracic aorta is noted. No pneumothorax or significant pleural effusion is noted. Hypoinflation of the lungs is noted. Bilateral perihilar and basilar densities are noted concerning for edema or possibly pneumonia. Bony thorax is unremarkable. IMPRESSION: Borderline cardiomegaly with mild central pulmonary vascular congestion. Aortic atherosclerosis. Hypoinflation of the lungs with bilateral perihilar and basilar densities concerning for edema or possibly pneumonia. Electronically Signed   By: Marijo Conception, M.D.   On: 01/02/2017 09:41   US Renal  Result Date: 01/03/2017 CLINICAL DATA:  Chronic kidney disease EXAM: RENAL / URINARY TRACT ULTRASOUND COMPLETE COMPARISON:  06/25/2016 FINDINGS: Right Kidney: Length: 11 cm. Borderline echogenic cortex. No mass or hydronephrosis visualized. Left Kidney: Length: 11 cm. Borderline echogenic cortex. No mass or hydronephrosis visualized. Bladder: Appears normal for degree of bladder distention. IMPRESSION: Stable renal ultrasound.  No hydronephrosis or atrophy. Electronically Signed   By: Monte Fantasia M.D.   On: 01/03/2017 14:01     Subjective: Feeling well, denies dyspnea.   Discharge Exam: Vitals:   01/05/17 2300 01/06/17 0525  BP:  (!) 185/51  Pulse:  70  Resp:  18 18  Temp:  98.6 F (37 C)  SpO2:  95%   Vitals:   01/05/17 2153 01/05/17 2300 01/06/17 0525 01/06/17 0857  BP: (!) 172/37  (!) 185/51   Pulse: 62  70   Resp: 18 18 18    Temp: 98.7 F (37.1 C)  98.6 F (37 C)   TempSrc: Oral  Oral   SpO2: 93%  95%   Weight:    79.2 kg (174 lb 11.2 oz)  Height:        General: Pt is alert, awake, not in acute distress Cardiovascular: RRR, S1/S2 +, no rubs, no gallops Respiratory: CTA bilaterally, no wheezing, no rhonchi Abdominal: Soft, NT, ND, bowel sounds + Extremities: no edema, no cyanosis    The results of significant diagnostics from this hospitalization (including imaging, microbiology, ancillary and laboratory) are listed below for reference.     Microbiology: No results found for this or any previous visit (from the past 240 hour(s)).   Labs: BNP (last 3 results)  Recent Labs  01/02/17 0916 01/03/17 0018  BNP 685.4* 195.0*   Basic Metabolic Panel:  Recent Labs Lab 01/02/17 0916 01/03/17 0018 01/04/17 0437 01/05/17 0641 01/06/17 0523  NA 140 142 141 138 142  K 3.4* 3.4* 4.2 4.0 3.6  CL 105 107 107 100* 103  CO2 26 26 28 29 31   GLUCOSE 129* 112* 104* 129* 67  BUN 34* 35* 37* 37* 37*  CREATININE 2.42* 2.43* 2.31* 2.20* 2.14*  CALCIUM 8.3* 8.2* 8.3* 8.6* 8.6*  PHOS  --   --  3.1 3.6 4.0   Liver Function Tests:  Recent Labs Lab 01/02/17 0916 01/04/17 0437 01/05/17 0641 01/06/17 0523  AST 35  --   --   --   ALT 15  --   --   --   ALKPHOS 124  --   --   --   BILITOT 0.6  --   --   --   PROT 7.4  --   --   --   ALBUMIN 3.3* 2.8* 2.8* 2.7*   No results for input(s): LIPASE, AMYLASE in the last 168 hours. No results for input(s): AMMONIA in the last 168 hours. CBC:  Recent Labs Lab 01/02/17 0916 01/04/17 0437 01/05/17 0641 01/06/17 0924  WBC 7.7 5.6 3.9*  --  NEUTROABS 6.0  --   --   --   HGB 9.6* 7.8* 7.8* 8.1*  HCT 30.0* 24.8* 24.5* 25.2*  MCV 87.2 87.9 88.1  --   PLT 238 187 191  --     Cardiac Enzymes:  Recent Labs Lab 01/02/17 0916 01/02/17 1535 01/02/17 2156 01/03/17 0018  TROPONINI 0.03* 0.03* 0.03* 0.04*   BNP: Invalid input(s): POCBNP CBG:  Recent Labs Lab 01/05/17 1204 01/05/17 1605 01/05/17 2146 01/06/17 0805 01/06/17 0842  GLUCAP 179* 153* 121* 69 155*   D-Dimer No results for input(s): DDIMER in the last 72 hours. Hgb A1c No results for input(s): HGBA1C in the last 72 hours. Lipid Profile No results for input(s): CHOL, HDL, LDLCALC, TRIG, CHOLHDL, LDLDIRECT in the last 72 hours. Thyroid function studies No results for input(s): TSH, T4TOTAL, T3FREE, THYROIDAB in the last 72 hours.  Invalid input(s): FREET3 Anemia work up  Recent Labs  01/03/17 1321  TIBC 242*  IRON 20*   Urinalysis    Component Value Date/Time   COLORURINE YELLOW 08/24/2015 [place] 08/24/2015 1824   LABSPEC 1.018 08/24/2015 1824   PHURINE 5.0 08/24/2015 1824   GLUCOSEU 250 (A) 08/24/2015 1824   HGBUR NEGATIVE 08/24/2015 1824   BILIRUBINUR NEGATIVE 08/24/2015 [place] 08/24/2015 1824   PROTEINUR 100 (A) 08/24/2015 1824   UROBILINOGEN 0.2 07/04/2014 1900   NITRITE NEGATIVE 08/24/2015 1824   LEUKOCYTESUR NEGATIVE 08/24/2015 1824   Sepsis Labs Invalid input(s): PROCALCITONIN,  WBC,  LACTICIDVEN Microbiology No results found for this or any previous visit (from the past 240 hour(s)).   Time coordinating discharge: Over 30 minutes  SIGNED:   Elmarie Shiley, MD  [facility] 01/06/2017, 9:59 AM Pager   If 7PM-7AM, please contact night-coverage [email or web address] Password [email or web address]

## 2017-01-06 NOTE — Care Management Note (Signed)
Case Management Note  Patient Details  Name: Glenda [name] MRN: [id number] Date of Birth: 1934/02/21  Subjective/Objective: HHRN/HHPT ordered, faxed w/ confirmation to [facility] CM aware of d/c & orders. [facility]will be able to fill scripts from the d/c summary @ their pharmacy, & deliver to patient's home. No 02 needed. No further CM needs.                   Action/Plan:d/c home w/HHC.   Expected Discharge Date:  01/06/17               Expected Discharge Plan:  [place]  In-House Referral:     Discharge planning Services  CM Consult  Post Acute Care Choice:  Resumption of Svcs/PTA Provider, Home Health ([facility]-HHC-HHPT/CSW/RN) Choice offered to:     DME Arranged:    DME Agency:     HH Arranged:  RN, PT [place] Agency:  Other - See comment ([facility])  Status of Service:  Completed, signed off  If discussed at [place] of Stay Meetings, dates discussed:    Additional Comments:  Dessa Phi, RN 01/06/2017, 9:55 AM

## 2017-01-11 ENCOUNTER — Ambulatory Visit (HOSPITAL_BASED_OUTPATIENT_CLINIC_OR_DEPARTMENT_OTHER): Payer: Medicare (Managed Care) | Admitting: Hematology and Oncology

## 2017-01-11 ENCOUNTER — Encounter: Payer: Self-pay | Admitting: Hematology and Oncology

## 2017-01-11 DIAGNOSIS — C50411 Malignant neoplasm of upper-outer quadrant of right female breast: Secondary | ICD-10-CM | POA: Diagnosis not present

## 2017-01-11 DIAGNOSIS — Z171 Estrogen receptor negative status [ER-]: Secondary | ICD-10-CM

## 2017-01-11 NOTE — Progress Notes (Signed)
Patient Care Team: Alroy Dust, L.Marlou Sa, MD as PCP - General (Family Medicine) Erroll Luna, MD as Consulting Physician (General Surgery) Nicholas Lose, MD as Consulting Physician (Hematology and Oncology) Gery Pray, MD as Consulting Physician (Radiation Oncology)  DIAGNOSIS:  Encounter Diagnosis  Name Primary?  . Malignant neoplasm of upper-outer quadrant of right breast in female, estrogen receptor negative ([place])     SUMMARY OF ONCOLOGIC HISTORY:   Breast cancer of upper-outer quadrant of right female breast ([place])   04/14/2015 Mammogram    Right breast asymmetry at 9:30 position 1.3 x 0.8 x 1.4 cm      04/24/2015 Initial Diagnosis    Right breast biopsy 9:30 position: DCIS high-grade with necrosis and calcifications,? Early invasive disease, ER 0% PR 0%      07/17/2015 Surgery    Right lumpectomy: IDC grade 2, 0.3 cm, with HG-DCIS with comedonecrosis, DCIS focally less than 0.1 cm from posterior margin and broadly 0.2 cm from posterior margin, 0/1 LN neg, T1 aN0 stage IA, ER 0%, PR 0%, Ki-67 5%, HER-2 neg ratio 0.93      09/11/2015 - 10/13/2015 Radiation Therapy    Right breast was treated with 42.72 Gy in 16 fractions with a boost of 12 Gy in 6 fractions.       CHIEF COMPLIANT: Surveillance of breast cancer  INTERVAL HISTORY: Glenda [name] is a 83 year old with above-mentioned history right breast cancer treated with lumpectomy and radiation. She was recently in the hospital with congestive heart failure and was discharged last week. She reports that her breathing has improved. She is in a wheelchair. She has chronic lymphedema of the right breast. She denies any new symptoms or concerns. Her mammograms scheduled for December.  REVIEW OF SYSTEMS:   Constitutional: Denies fevers, chills or abnormal weight loss Eyes: Denies blurriness of vision Ears, nose, mouth, throat, and face: Denies mucositis or sore throat Respiratory: Denies cough, dyspnea or  wheezes Cardiovascular: Recently hospitalized for shortness of breath Gastrointestinal:  Denies nausea, heartburn or change in bowel habits Skin: Denies abnormal skin rashes Lymphatics: Denies new lymphadenopathy or easy bruising Neurological: Uses a wheelchair for ambulation because of generalized weakness Behavioral/Psych: Mood is stable, no new changes  Extremities: No lower extremity edema Breast: Right breast swelling and scar tissue All other systems were reviewed with the patient and are negative.  I have reviewed the past medical history, past surgical history, social history and family history with the patient and they are unchanged from previous note.  ALLERGIES:  has No Known Allergies.  MEDICATIONS:  Current Outpatient Prescriptions  Medication Sig Dispense Refill  . amLODipine (NORVASC) 10 MG tablet Take 10 mg by mouth daily. Reported on 10/31/2015    . aspirin 81 MG chewable tablet Chew 81 mg by mouth daily.    . carvedilol (COREG) 12.5 MG tablet Take 1 tablet (12.5 mg total) by mouth 2 (two) times daily with a meal. 60 tablet 0  . citalopram (CELEXA) 20 MG tablet Take 20 mg by mouth daily.    . cloNIDine (CATAPRES) 0.2 MG tablet Take 0.2 mg by mouth 2 (two) times daily.     . clopidogrel (PLAVIX) 75 MG tablet Take 75 mg by mouth daily.     . furosemide (LASIX) 80 MG tablet Take 2 tablets (160 mg total) by mouth 3 (three) times daily. 120 tablet 0  . insulin detemir (LEVEMIR) 100 UNIT/ML injection Inject 0.12 mLs (12 Units total) into the skin at bedtime. 10 mL 11  . isosorbide-hydrALAZINE (  BIDIL) 20-37.5 MG tablet Take 2 tablets by mouth 3 (three) times daily. 120 tablet 0  . Nutritional Supplements (FEEDING SUPPLEMENT, NEPRO CARB STEADY,) LIQD Take 237 mLs by mouth 2 (two) times daily between meals. 30 Can 0  . pravastatin (PRAVACHOL) 80 MG tablet Take 80 mg by mouth daily.    . ranitidine (ZANTAC) 300 MG tablet Take 300 mg by mouth at bedtime.     No current  facility-administered medications for this visit.     PHYSICAL EXAMINATION: ECOG PERFORMANCE STATUS: 1 - Symptomatic but completely ambulatory  Vitals:   01/11/17 1028  BP: (!) 156/41  Pulse: (!) 56  Resp: 18  Temp: 98.3 F (36.8 C)  SpO2: 95%   Filed Weights   01/11/17 1028  Weight: 167 lb 3.2 oz (75.8 kg)    GENERAL:alert, no distress and comfortable SKIN: skin color, texture, turgor are normal, no rashes or significant lesions EYES: normal, Conjunctiva are pink and non-injected, sclera clear OROPHARYNX:no exudate, no erythema and lips, buccal mucosa, and tongue normal  NECK: supple, thyroid normal size, non-tender, without nodularity LYMPH:  no palpable lymphadenopathy in the cervical, axillary or inguinal LUNGS: clear to auscultation and percussion with normal breathing effort HEART: regular rate & rhythm and no murmurs and no lower extremity edema ABDOMEN:abdomen soft, non-tender and normal bowel sounds MUSCULOSKELETAL:no cyanosis of digits and no clubbing  NEURO: alert & oriented x 3 with fluent speech, no focal motor/sensory deficits EXTREMITIES: No lower extremity edema BREAST: No palpable masses or nodules in either right or left breasts. There is significant right breast lymphedema along with that palpable nodularity along the scar tissue.  No palpable axillary supraclavicular or infraclavicular adenopathy no breast tenderness or nipple discharge. (exam performed in the presence of a chaperone)  LABORATORY DATA:  I have reviewed the data as listed   Chemistry      Component Value Date/Time   NA 142 01/06/2017 0523   NA 140 09/16/2015   NA 141 05/07/2015 1251   K 3.6 01/06/2017 0523   K 4.3 05/07/2015 1251   CL 103 01/06/2017 0523   CO2 31 01/06/2017 0523   CO2 22 05/07/2015 1251   BUN 37 (H) 01/06/2017 0523   BUN 34 (A) 09/16/2015   BUN 22.5 05/07/2015 1251   CREATININE 2.14 (H) 01/06/2017 0523   CREATININE 1.2 (H) 05/07/2015 1251   GLU 231 09/16/2015       Component Value Date/Time   CALCIUM 8.6 (L) 01/06/2017 0523   CALCIUM 9.1 05/07/2015 1251   ALKPHOS 124 01/02/2017 0916   ALKPHOS 95 05/07/2015 1251   AST 35 01/02/2017 0916   AST 15 05/07/2015 1251   ALT 15 01/02/2017 0916   ALT <9 05/07/2015 1251   BILITOT 0.6 01/02/2017 0916   BILITOT 0.30 05/07/2015 1251       Lab Results  Component Value Date   WBC 3.9 (L) 01/05/2017   HGB 8.1 (L) 01/06/2017   HCT 25.2 (L) 01/06/2017   MCV 88.1 01/05/2017   PLT 191 01/05/2017   NEUTROABS 6.0 01/02/2017    ASSESSMENT & PLAN:  Breast cancer of upper-outer quadrant of right female breast ([place]) Right lumpectomy 07/17/2015: IDC grade 2, 0.3 cm, with high-grade DCIS with comedonecrosis, DCIS focally less than 0.1 cm from posterior margin and broadly 0.2 cm from posterior margin, 0/1 lymph node negative, T1 aN0 stage IA, ER 0%, PR 0%, HER-2 negative ratio 0.93  Original right breast biopsy was DCIS ER/PR negative Adjuvant radiation therapy: 09/11/2015  to 10/13/2015 Hospitalization for CHF 01/06/2017  Current treatment: Surveillance Breast Cancer Surveillance: 1. Breast exam 01/11/2017: Right breast lymphedema and scar tissue 2. Mammogram  06/11/2015: Benign, breast density category C  Return to clinic in one year for surveillance and follow-up   I spent 15 minutes talking to the patient of which more than half was spent in counseling and coordination of care.  No orders of the defined types were placed in this encounter.  The patient has a good understanding of the overall plan. she agrees with it. she will call with any problems that may develop before the next visit here.   Rulon Eisenmenger, MD 01/11/17

## 2017-01-11 NOTE — Assessment & Plan Note (Addendum)
Right lumpectomy 07/17/2015: IDC grade 2, 0.3 cm, with high-grade DCIS with comedonecrosis, DCIS focally less than 0.1 cm from posterior margin and broadly 0.2 cm from posterior margin, 0/1 lymph node negative, T1 aN0 stage IA, ER 0%, PR 0%, HER-2 negative ratio 0.93  Original right breast biopsy was DCIS ER/PR negative Adjuvant radiation therapy: 09/11/2015 to 10/13/2015 Hospitalization for CHF 01/06/2017  Current treatment: Surveillance Breast Cancer Surveillance: 1. Breast exam 01/11/2017: Normal 2. Mammogram to be done 04/09/2016: Benign, breast density category C  Return to clinic in one year for surveillance and follow-up

## 2017-01-24 ENCOUNTER — Ambulatory Visit: Payer: Medicare (Managed Care) | Admitting: Physician Assistant

## 2017-02-07 ENCOUNTER — Ambulatory Visit: Payer: Medicare (Managed Care) | Admitting: Nurse Practitioner

## 2017-02-18 NOTE — Progress Notes (Deleted)
[facility]  PATIENT: Glenda [name] DOB: Jul 03, 1933   REASON FOR VISIT: Hospital follow-up for stroke  HISTORY FROM: Patient ,daughter Glenda [name]    HISTORY OF PRESENT ILLNESS:SAToday, 11/08/2016 (all dictated new, as well as above notes, some dictation done in note pad or Word, outside of chart, may appear as copied):  I reviewed her CPAP compliance data from 10/05/2016 through 11/03/2016 which is a total of 30 days, during which time she used her CPAP only 10 days with percent used days greater than 4 hours and 30%, indicating poor compliance with an average usage of 7 hours and 27 minutes 4 days on treatment, AHI 0.6 per hour, leak high with the 95th percentile at 33.5 L/m on a pressure of 13 cm with EPR of 2. In the past 90 days, her compliance percentage was a little better at 42%. She reports Difficulty with her mask. She has trouble with mask fit and also had broken out in her face, perhaps from tightening the mask too much. She has not been in touch with her DME company. She is willing to get back on CPAP therapy. She is motivated to continue treatment. Of note, she does have sleep disruption at night, often has to go to the bathroom, when she takes her Lasix which is as needed, she takes it in the evening. She feels that it works better at night. She does not take it every day, she does take her blood pressure medicine daily which also contains hydrochlorothiazide, she takes that in the mornings.    UPDATE 04/09/2018CM Glenda [name], 83 year old female returns for follow-up with her daughter. She continues to live at home but someone is with her TIME. She has a history of stroke in April 2017. She is currently on aspirin and Plavix without further stroke or TIA symptoms. She has minimal bruising or bleeding In addition she has hyperlipidemia, her Crestor was recently changed to Pravachol by her primary care provider. She denies any myalgias She claims her diabetes is up  and down. Blood pressure office today 150/70. She has just started going to [facility] 3 [facility] a [facility]. She was referred for sleep study by me at her last visit. Her sleep study completed severe obstructive sleep apnea. She claims she is on CPAP however she doesn't remember exactly when this started sometime in December or January. She has not followed back up with Dr. Rexene Alberts for compliance. 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 She ambulates with a walker. She returns for reevaluation HISTORY 12/08/15 [name] is a 83 y.o. female recently admitted to [facility] from 08/24/2015 until 08/28/2015 for evaluation of dizziness, nausea, vomiting, and gait instability. An MRI on 08/24/2015 revealed a punctate acute infarct in the right middle cerebellar peduncle. The patient was seen in consultation by Dr. Leonel Ramsay on 08/24/2015 and subsequently followed by Dr. Leonie Man. The infarct was felt secondary to small vessel disease. The patient had been on aspirin 325 mg daily with Plavix 75 mg daily in the past; however, when seen by Dr. Leonie Man the aspirin was discontinued. She was however discharged to a nursing facility for short-term rehabilitation on 08/27/2005 on aspirin 81 mg daily with Plavix 75 mg daily.   The patient was readmitted 09/07/15  with nausea, vomiting, and dizziness. She was noted to have coffee-ground emesis. An MRI performed 09/05/2015 showed a mild acute extension of the lacunar type right cerebellar infarct. Neurology was asked to reconsult. MRA no high-grade stenosis. Echo ejection fraction 60-65% LDL  70 hemoglobin A1c 10.2 Recent coffee-ground emesis and antiplatelet was put on hold. She was discharged to a skilled nursing facility where she received some physical therapy. She is now back home and receiving outpatient rehabilitation physical therapy occupational therapy. Daughter lives with her. Patient uses a walker in the home and a wheelchair outside the home. No falls no further  stroke or TIA symptoms. She does complain of daytime drowsiness and not feeling rested in the morning. She returns for reevaluation    REVIEW OF SYSTEMS: Full 14 system review of systems performed and notable only for those listed, all others are neg:  Constitutional: neg  Cardiovascular: neg Ear/Nose/Throat: neg  Skin: neg Eyes: neg Respiratory: neg Gastroitestinal: neg  Hematology/Lymphatic: neg  Endocrine: neg Musculoskeletal: Gait abnormality Allergy/Immunology: neg Neurological: Weakness Psychiatric: neg Sleep : Obstructive sleep apnea with CPAP ALLERGIES: No Known Allergies  HOME MEDICATIONS: Outpatient Medications Prior to Visit  Medication Sig Dispense Refill  . amLODipine (NORVASC) 10 MG tablet Take 10 mg by mouth daily. Reported on 10/31/2015    . aspirin 81 MG chewable tablet Chew 81 mg by mouth daily.    . carvedilol (COREG) 12.5 MG tablet Take 1 tablet (12.5 mg total) by mouth 2 (two) times daily with a meal. 60 tablet 0  . citalopram (CELEXA) 20 MG tablet Take 20 mg by mouth daily.    . cloNIDine (CATAPRES) 0.2 MG tablet Take 0.2 mg by mouth 2 (two) times daily.     . clopidogrel (PLAVIX) 75 MG tablet Take 75 mg by mouth daily.     . furosemide (LASIX) 80 MG tablet Take 2 tablets (160 mg total) by mouth 3 (three) times daily. 120 tablet 0  . insulin detemir (LEVEMIR) 100 UNIT/ML injection Inject 0.12 mLs (12 Units total) into the skin at bedtime. 10 mL 11  . isosorbide-hydrALAZINE (BIDIL) 20-37.5 MG tablet Take 2 tablets by mouth 3 (three) times daily. 120 tablet 0  . Nutritional Supplements (FEEDING SUPPLEMENT, NEPRO CARB STEADY,) LIQD Take 237 mLs by mouth 2 (two) times daily between meals. 30 Can 0  . pravastatin (PRAVACHOL) 80 MG tablet Take 80 mg by mouth daily.    . ranitidine (ZANTAC) 300 MG tablet Take 300 mg by mouth at bedtime.     No facility-administered medications prior to visit.     PAST MEDICAL HISTORY: Past Medical History:  Diagnosis Date  .  Arthritis   . Breast cancer ([place])   . CAD (coronary artery disease)   . Chest pain   . Dizziness   . DM (diabetes mellitus) ([place])   . GERD (gastroesophageal reflux disease)   . HLD (hyperlipidemia)   . HTN (hypertension)   . Obesity   . Radiation 09/11/15-10/13/15   42.72 Gy to right breast, 12 Gy boost to right breast  . Shortness of breath dyspnea   . Stroke ([place])   . UTI (urinary tract infection)     PAST SURGICAL HISTORY: Past Surgical History:  Procedure Laterality Date  . CHOLECYSTECTOMY    . colon polyps; hx    . CORONARY ANGIOPLASTY  02/14/09  . ESOPHAGOGASTRODUODENOSCOPY (EGD) WITH PROPOFOL Left 09/08/2015   Procedure: ESOPHAGOGASTRODUODENOSCOPY (EGD) WITH PROPOFOL;  Surgeon: Clarene Essex, MD;  Location: [facility] ENDOSCOPY;  Service: Endoscopy;  Laterality: Left;  . RADIOACTIVE SEED GUIDED MASTECTOMY WITH AXILLARY SENTINEL LYMPH NODE BIOPSY Right 07/17/2015   Procedure: RADIOACTIVE SEED GUIDED PARTIAL MASTECTOMY WITH AXILLARY SENTINEL LYMPH NODE BIOPSY;  Surgeon: Erroll Luna, MD;  Location: [place];  Service:  General;  Laterality: Right;    FAMILY HISTORY: Family History  Problem Relation Age of Onset  . Diabetes Mother   . Colon cancer Father        also had lung  . Diabetes Unknown   . Heart attack Unknown   . Diabetes Sister        [name]  . Breast cancer Sister     SOCIAL HISTORY: Social History   Social History  . Marital status: Widowed    Spouse name: N/A  . Number of children: 5  . Years of education: 12   Occupational History  . Retired    Social History Main Topics  . Smoking status: Never Smoker  . Smokeless tobacco: Never Used  . Alcohol use No  . Drug use: No  . Sexual activity: Not Currently   Other Topics Concern  . Not on file   Social History Narrative   Retired.    Lives with daughter   Caffeine use: Drinks soda and coffee and tea daily     PHYSICAL EXAM  Vitals:   08/02/16 0921 08/02/16 0928  BP: (!) 150/70   Pulse: 61 60    Weight: 169 lb 6.4 oz (76.8 kg)    There is no height or weight on file to calculate BMI.  Generalized: Well developed, in no acute distress , well-groomed Head: normocephalic and atraumatic,. Oropharynx benign  Neck: Supple, no carotid bruits  Cardiac: Regular rate rhythm, no murmur  Musculoskeletal: No deformity   Neurological examination   Mentation: Alert oriented to time, place, history taking. Attention span and concentration appropriate. Recent and remote memory intact.  Follows all commands speech and language fluent.   Cranial nerve II-XII: Pupils were equal round reactive to light extraocular movements were full, visual field were full on confrontational test. Facial sensation and strength were normal. hearing was intact to finger rubbing bilaterally. Uvula tongue midline. head turning and shoulder shrug were normal and symmetric.Tongue protrusion into cheek strength was normal. Motor: normal bulk and tone, full strength in the BUE, BLE, except right upper extremity 4 out of 5 and poor grip. Sensory: normal and symmetric to light touch, pinprick, and  Vibration, in the upper and lower extremities, except mildly decreased pinprick right lower extremity and right upper extremity Coordination: finger-nose-finger, heel-to-shin bilaterally, dysmetria on the right Reflexes: Symmetric upper and lower, plantar responses were flexor bilaterally. Gait and Station: In wheelchair not ambulated due to safety concerns DIAGNOSTIC DATA (LABS, IMAGING, TESTING) - I reviewed patient records, labs, notes, testing and imaging myself where available.  Lab Results  Component Value Date   WBC 3.9 (L) 01/05/2017   HGB 8.1 (L) 01/06/2017   HCT 25.2 (L) 01/06/2017   MCV 88.1 01/05/2017   PLT 191 01/05/2017      Component Value Date/Time   NA 142 01/06/2017 0523   NA 140 09/16/2015   NA 141 05/07/2015 1251   K 3.6 01/06/2017 0523   K 4.3 05/07/2015 1251   CL 103 01/06/2017 0523   CO2 31  01/06/2017 0523   CO2 22 05/07/2015 1251   GLUCOSE 67 01/06/2017 0523   GLUCOSE 110 05/07/2015 1251   BUN 37 (H) 01/06/2017 0523   BUN 34 (A) 09/16/2015   BUN 22.5 05/07/2015 1251   CREATININE 2.14 (H) 01/06/2017 0523   CREATININE 1.2 (H) 05/07/2015 1251   CALCIUM 8.6 (L) 01/06/2017 0523   CALCIUM 9.1 05/07/2015 1251   PROT 7.4 01/02/2017 0916   PROT 7.1 05/07/2015  1251   ALBUMIN 2.7 (L) 01/06/2017 0523   ALBUMIN 3.3 (L) 05/07/2015 1251   AST 35 01/02/2017 0916   AST 15 05/07/2015 1251   ALT 15 01/02/2017 0916   ALT <9 05/07/2015 1251   ALKPHOS 124 01/02/2017 0916   ALKPHOS 95 05/07/2015 1251   BILITOT 0.6 01/02/2017 0916   BILITOT 0.30 05/07/2015 1251   GFRNONAA 21 (L) 01/06/2017 0523   GFRAA 24 (L) 01/06/2017 0523   Lab Results  Component Value Date   CHOL 151 08/25/2015   HDL 47 08/25/2015   LDLCALC 70 08/25/2015   TRIG 168 (H) 08/25/2015   CHOLHDL 3.2 08/25/2015   Lab Results  Component Value Date   HGBA1C 10.2 (H) 08/25/2015    Lab Results  Component Value Date   TSH 1.145 01/02/2017      ASSESSMENT AND PLAN  83 y.o. year old female  has a past medical history of Arthritis;  CAD (coronary artery disease); Chest pain; Dizziness; DM (diabetes mellitus) ([place]);  HLD (hyperlipidemia); HTN (hypertension); Obesity;  Shortness of breath dyspnea; Stroke [facility]); Here to follow-up for her stroke which occurred in April 2017. The patient is a current patient of Dr. Erlinda Hong  who is out of the office today . This note is sent to the work in doctor.     PLAN: Stressed the importance of management of risk factors to prevent further stroke Continue aspirin and Plavix for secondary stroke prevention Maintain strict control of hypertension with blood pressure goal below 130/90, today's reading 150/60 continue antihypertensive medications Control of diabetes with hemoglobin A1c below 6.5 followed by primary care  continue diabetic medications  Cholesterol with LDL cholesterol less  than 70, followed by primary care,   continue statin drugs Pravachol Pt going to [facility]  Sleep study positive for OSA need follow up with Dr. Rexene Alberts for compliance Follow-up with me in 6 months make appt with Dr. Rexene Alberts in next 6 weeks I spent 25 min  in total face to face time with the patient more than 50% of which was spent counseling and coordination of care, reviewing test results reviewing medications and discussing and reviewing the diagnosis of stroke and the importance of management of risk factors. Dennie Bible, [facility], [facility], APRN  [facility] [address], [place], [place] [address] [phone]

## 2017-02-21 ENCOUNTER — Ambulatory Visit: Payer: Medicare (Managed Care) | Admitting: Nurse Practitioner

## 2017-02-22 ENCOUNTER — Encounter: Payer: Self-pay | Admitting: Nurse Practitioner

## 2017-02-28 ENCOUNTER — Encounter (HOSPITAL_COMMUNITY): Payer: Medicare (Managed Care)

## 2017-02-28 ENCOUNTER — Inpatient Hospital Stay (HOSPITAL_COMMUNITY)
Admission: EM | Admit: 2017-02-28 | Discharge: 2017-03-06 | DRG: 291 | Disposition: A | Discharge status: Home Health | Payer: Medicare (Managed Care) | Attending: Internal Medicine | Admitting: Internal Medicine

## 2017-02-28 ENCOUNTER — Other Ambulatory Visit: Payer: Self-pay

## 2017-02-28 ENCOUNTER — Emergency Department (HOSPITAL_COMMUNITY): Payer: Medicare (Managed Care)

## 2017-02-28 ENCOUNTER — Encounter (HOSPITAL_COMMUNITY): Payer: Self-pay

## 2017-02-28 DIAGNOSIS — N183 Chronic kidney disease, stage 3 (moderate): Secondary | ICD-10-CM | POA: Diagnosis not present

## 2017-02-28 DIAGNOSIS — Z7902 Long term (current) use of antithrombotics/antiplatelets: Secondary | ICD-10-CM | POA: Diagnosis not present

## 2017-02-28 DIAGNOSIS — Z803 Family history of malignant neoplasm of breast: Secondary | ICD-10-CM | POA: Diagnosis not present

## 2017-02-28 DIAGNOSIS — Z853 Personal history of malignant neoplasm of breast: Secondary | ICD-10-CM

## 2017-02-28 DIAGNOSIS — I248 Other forms of acute ischemic heart disease: Secondary | ICD-10-CM | POA: Diagnosis present

## 2017-02-28 DIAGNOSIS — Z833 Family history of diabetes mellitus: Secondary | ICD-10-CM | POA: Diagnosis not present

## 2017-02-28 DIAGNOSIS — Z87440 Personal history of urinary (tract) infections: Secondary | ICD-10-CM

## 2017-02-28 DIAGNOSIS — E876 Hypokalemia: Secondary | ICD-10-CM | POA: Diagnosis not present

## 2017-02-28 DIAGNOSIS — M1990 Unspecified osteoarthritis, unspecified site: Secondary | ICD-10-CM | POA: Diagnosis present

## 2017-02-28 DIAGNOSIS — I5033 Acute on chronic diastolic (congestive) heart failure: Secondary | ICD-10-CM | POA: Diagnosis present

## 2017-02-28 DIAGNOSIS — Z794 Long term (current) use of insulin: Secondary | ICD-10-CM

## 2017-02-28 DIAGNOSIS — K219 Gastro-esophageal reflux disease without esophagitis: Secondary | ICD-10-CM | POA: Diagnosis present

## 2017-02-28 DIAGNOSIS — E785 Hyperlipidemia, unspecified: Secondary | ICD-10-CM | POA: Diagnosis present

## 2017-02-28 DIAGNOSIS — E1122 Type 2 diabetes mellitus with diabetic chronic kidney disease: Secondary | ICD-10-CM | POA: Diagnosis present

## 2017-02-28 DIAGNOSIS — D509 Iron deficiency anemia, unspecified: Secondary | ICD-10-CM | POA: Diagnosis present

## 2017-02-28 DIAGNOSIS — Z923 Personal history of irradiation: Secondary | ICD-10-CM

## 2017-02-28 DIAGNOSIS — N179 Acute kidney failure, unspecified: Secondary | ICD-10-CM

## 2017-02-28 DIAGNOSIS — N184 Chronic kidney disease, stage 4 (severe): Secondary | ICD-10-CM | POA: Diagnosis present

## 2017-02-28 DIAGNOSIS — M7989 Other specified soft tissue disorders: Secondary | ICD-10-CM | POA: Diagnosis not present

## 2017-02-28 DIAGNOSIS — I160 Hypertensive urgency: Secondary | ICD-10-CM | POA: Diagnosis present

## 2017-02-28 DIAGNOSIS — Z9861 Coronary angioplasty status: Secondary | ICD-10-CM | POA: Diagnosis not present

## 2017-02-28 DIAGNOSIS — D631 Anemia in chronic kidney disease: Secondary | ICD-10-CM | POA: Diagnosis present

## 2017-02-28 DIAGNOSIS — I2510 Atherosclerotic heart disease of native coronary artery without angina pectoris: Secondary | ICD-10-CM | POA: Diagnosis present

## 2017-02-28 DIAGNOSIS — I69331 Monoplegia of upper limb following cerebral infarction affecting right dominant side: Secondary | ICD-10-CM

## 2017-02-28 DIAGNOSIS — N189 Chronic kidney disease, unspecified: Secondary | ICD-10-CM

## 2017-02-28 DIAGNOSIS — J9601 Acute respiratory failure with hypoxia: Secondary | ICD-10-CM | POA: Diagnosis not present

## 2017-02-28 DIAGNOSIS — R0602 Shortness of breath: Secondary | ICD-10-CM | POA: Diagnosis present

## 2017-02-28 DIAGNOSIS — I130 Hypertensive heart and chronic kidney disease with heart failure and stage 1 through stage 4 chronic kidney disease, or unspecified chronic kidney disease: Secondary | ICD-10-CM | POA: Diagnosis present

## 2017-02-28 DIAGNOSIS — R0902 Hypoxemia: Secondary | ICD-10-CM | POA: Diagnosis present

## 2017-02-28 DIAGNOSIS — Z79899 Other long term (current) drug therapy: Secondary | ICD-10-CM

## 2017-02-28 DIAGNOSIS — Z86010 Personal history of colonic polyps: Secondary | ICD-10-CM

## 2017-02-28 DIAGNOSIS — Z7982 Long term (current) use of aspirin: Secondary | ICD-10-CM

## 2017-02-28 LAB — I-STAT CG4 LACTIC ACID, ED: Lactic Acid, Venous: 0.88 mmol/L (ref 0.5–1.9)

## 2017-02-28 LAB — CBC
HCT: 29.9 % — ABNORMAL LOW (ref 36.0–46.0)
HEMOGLOBIN: 9.4 g/dL — AB (ref 12.0–15.0)
MCH: 27.5 pg (ref 26.0–34.0)
MCHC: 31.4 g/dL (ref 30.0–36.0)
MCV: 87.4 fL (ref 78.0–100.0)
Platelets: 185 10*3/uL (ref 150–400)
RBC: 3.42 MIL/uL — ABNORMAL LOW (ref 3.87–5.11)
RDW: 14.4 % (ref 11.5–15.5)
WBC: 8 10*3/uL (ref 4.0–10.5)

## 2017-02-28 LAB — GLUCOSE, CAPILLARY: Glucose-Capillary: 276 mg/dL — ABNORMAL HIGH (ref 65–99)

## 2017-02-28 LAB — BLOOD GAS, VENOUS
ACID-BASE EXCESS: 0.9 mmol/L (ref 0.0–2.0)
Bicarbonate: 26.7 mmol/L (ref 20.0–28.0)
O2 CONTENT: 2 L/min
O2 SAT: 79.7 %
PATIENT TEMPERATURE: 98.6
PCO2 VEN: 51.8 mmHg (ref 44.0–60.0)
PO2 VEN: 52.1 mmHg — AB (ref 32.0–45.0)
pH, Ven: 7.333 (ref 7.250–7.430)

## 2017-02-28 LAB — COMPREHENSIVE METABOLIC PANEL
ALBUMIN: 3.4 g/dL — AB (ref 3.5–5.0)
ALK PHOS: 116 U/L (ref 38–126)
ALT: 13 U/L — AB (ref 14–54)
ANION GAP: 14 (ref 5–15)
AST: 20 U/L (ref 15–41)
BUN: 62 mg/dL — ABNORMAL HIGH (ref 6–20)
CALCIUM: 8.5 mg/dL — AB (ref 8.9–10.3)
CHLORIDE: 100 mmol/L — AB (ref 101–111)
CO2: 25 mmol/L (ref 22–32)
CREATININE: 2.81 mg/dL — AB (ref 0.44–1.00)
GFR calc Af Amer: 17 mL/min — ABNORMAL LOW (ref >60)
GFR calc non Af Amer: 15 mL/min — ABNORMAL LOW (ref >60)
GLUCOSE: 302 mg/dL — AB (ref 65–99)
Potassium: 3.6 mmol/L (ref 3.5–5.1)
SODIUM: 139 mmol/L (ref 135–145)
Total Bilirubin: 0.6 mg/dL (ref 0.3–1.2)
Total Protein: 7.4 g/dL (ref 6.5–8.1)

## 2017-02-28 LAB — MRSA PCR SCREENING: MRSA by PCR: NEGATIVE

## 2017-02-28 LAB — TROPONIN I
Troponin I: <0.03 ng/mL (ref <0.03)
Troponin I: 0.53 ng/mL (ref <0.03)

## 2017-02-28 LAB — BRAIN NATRIURETIC PEPTIDE: B NATRIURETIC PEPTIDE 5: 397.1 pg/mL — AB (ref 0.0–100.0)

## 2017-02-28 MED ORDER — NITROGLYCERIN IN D5W 200-5 MCG/ML-% IV SOLN
0.0000 ug/min | INTRAVENOUS | Status: DC
  Administered 2017-02-28: 5 ug/min via INTRAVENOUS
  Administered 2017-03-01 – 2017-03-02 (×3): 100 ug/min via INTRAVENOUS
  Filled 2017-02-28 (×4): qty 250

## 2017-02-28 MED ORDER — FUROSEMIDE 10 MG/ML IJ SOLN
160.0000 mg | Freq: Three times a day (TID) | INTRAVENOUS | Status: AC
  Administered 2017-02-28 – 2017-03-04 (×13): 160 mg via INTRAVENOUS
  Filled 2017-02-28 (×2): qty 10
  Filled 2017-02-28: qty 16
  Filled 2017-02-28: qty 6
  Filled 2017-02-28: qty 16
  Filled 2017-02-28: qty 12
  Filled 2017-02-28: qty 16
  Filled 2017-02-28: qty 12
  Filled 2017-02-28 (×2): qty 16
  Filled 2017-02-28 (×2): qty 12
  Filled 2017-02-28: qty 10
  Filled 2017-02-28: qty 6

## 2017-02-28 MED ORDER — INSULIN ASPART 100 UNIT/ML ~~LOC~~ SOLN
0.0000 [IU] | Freq: Three times a day (TID) | SUBCUTANEOUS | Status: DC
  Administered 2017-03-01: 7 [IU] via SUBCUTANEOUS
  Administered 2017-03-01: 3 [IU] via SUBCUTANEOUS
  Administered 2017-03-01: 7 [IU] via SUBCUTANEOUS
  Administered 2017-03-02 (×2): 4 [IU] via SUBCUTANEOUS
  Administered 2017-03-02: 7 [IU] via SUBCUTANEOUS
  Administered 2017-03-03: 4 [IU] via SUBCUTANEOUS
  Administered 2017-03-03: 11 [IU] via SUBCUTANEOUS
  Administered 2017-03-04: 7 [IU] via SUBCUTANEOUS
  Administered 2017-03-04 – 2017-03-05 (×3): 4 [IU] via SUBCUTANEOUS
  Administered 2017-03-05 – 2017-03-06 (×2): 7 [IU] via SUBCUTANEOUS
  Administered 2017-03-06: 3 [IU] via SUBCUTANEOUS
  Administered 2017-03-06: 11 [IU] via SUBCUTANEOUS

## 2017-02-28 MED ORDER — ALBUTEROL SULFATE (2.5 MG/3ML) 0.083% IN NEBU
5.0000 mg | INHALATION_SOLUTION | Freq: Once | RESPIRATORY_TRACT | Status: AC
  Administered 2017-02-28: 5 mg via RESPIRATORY_TRACT
  Filled 2017-02-28: qty 6

## 2017-02-28 MED ORDER — HEPARIN SODIUM (PORCINE) 5000 UNIT/ML IJ SOLN
5000.0000 [IU] | Freq: Three times a day (TID) | INTRAMUSCULAR | Status: DC
  Administered 2017-02-28 – 2017-03-03 (×10): 5000 [IU] via SUBCUTANEOUS
  Filled 2017-02-28 (×10): qty 1

## 2017-02-28 MED ORDER — ASPIRIN 300 MG RE SUPP: 300.0000 mg | Freq: Every day | RECTAL | Status: DC

## 2017-02-28 MED ORDER — CARVEDILOL 12.5 MG PO TABS
12.5000 mg | ORAL_TABLET | Freq: Two times a day (BID) | ORAL | Status: DC
  Administered 2017-02-28 – 2017-03-06 (×13): 12.5 mg via ORAL
  Filled 2017-02-28 (×15): qty 1

## 2017-02-28 MED ORDER — CLOPIDOGREL BISULFATE 75 MG PO TABS
75.0000 mg | ORAL_TABLET | Freq: Every day | ORAL | Status: DC
  Administered 2017-02-28 – 2017-03-06 (×6): 75 mg via ORAL
  Filled 2017-02-28 (×6): qty 1

## 2017-02-28 MED ORDER — INSULIN DETEMIR 100 UNIT/ML ~~LOC~~ SOLN
12.0000 [IU] | Freq: Every day | SUBCUTANEOUS | Status: DC
  Administered 2017-02-28 – 2017-03-04 (×4): 12 [IU] via SUBCUTANEOUS
  Filled 2017-02-28 (×7): qty 0.12

## 2017-02-28 MED ORDER — ASPIRIN 81 MG PO CHEW
81.0000 mg | CHEWABLE_TABLET | Freq: Every day | ORAL | Status: DC
  Administered 2017-02-28 – 2017-03-06 (×7): 81 mg via ORAL
  Filled 2017-02-28 (×7): qty 1

## 2017-02-28 MED ORDER — PRAVASTATIN SODIUM 40 MG PO TABS
80.0000 mg | ORAL_TABLET | Freq: Every day | ORAL | Status: DC
  Administered 2017-02-28 – 2017-03-06 (×7): 80 mg via ORAL
  Filled 2017-02-28 (×3): qty 2
  Filled 2017-02-28 (×5): qty 4

## 2017-02-28 MED ORDER — FUROSEMIDE 10 MG/ML IJ SOLN
160.0000 mg | INTRAVENOUS | Status: AC
  Administered 2017-02-28: 160 mg via INTRAVENOUS
  Filled 2017-02-28: qty 10

## 2017-02-28 MED ORDER — NITROGLYCERIN 2 % TD OINT
1.0000 [in_us] | TOPICAL_OINTMENT | Freq: Once | TRANSDERMAL | Status: AC
  Administered 2017-02-28: 1 [in_us] via TOPICAL
  Filled 2017-02-28: qty 1

## 2017-02-28 NOTE — ED Notes (Signed)
Hospitalist called and stated she wanted attending RN to attempt to give patient PO medications (can temp. take off of bipap) and see if she is able to tolerate them.  If not, she wants that RN to page her and let her know.

## 2017-02-28 NOTE — ED Notes (Signed)
RRT KENNAN PRESENT TO APPLY BIPAP. CHARGE MATTQ RN MADE AWARE OF PT CURRENT STATUS. FAMILY PRESENT

## 2017-02-28 NOTE — ED Notes (Signed)
Critical Troponin 0.37

## 2017-02-28 NOTE — ED Notes (Signed)
BLOOD CULTURE X 1 5ML EACH LEFT AC COLLECTED

## 2017-02-28 NOTE — ED Triage Notes (Signed)
Pt reports she has felt SOB, had a cough, and wheezing since this am. Denies any pain or recent travel.

## 2017-02-28 NOTE — ED Notes (Signed)
ADMISSION HOSPITALIST Provider at bedside.

## 2017-02-28 NOTE — ED Notes (Signed)
ADMISSION Provider at bedside. 

## 2017-02-28 NOTE — H&P (Signed)
History and Physical  Glenda [name] IRS:[id number] DOB: 29-Dec-1933 DOA: 02/28/2017  Referring physician: EDP PCP: Alroy Dust, L.Marlou Sa, MD   Chief Complaint: sob, edema, elevated blood pressure  HPI: Glenda [name] is a 83 y.o. female   History of hypertension, insulin-dependent type 2 diabetes, history of multiple CVAs with residual right upper extremity weakness, history of breast cancer status post right lumpectomy in 2017 with adjuvant radiation therapy, history of CKD 4, presented to emergency room complaining of shortness of breath.  He denied fever, no chest pain, she reports on nonproductive cough, edema at baseline.  ED course: No fever, blood pressure elevated ,she is hypoxic on room air, initially O2 sat in the 70s, .  BMP showed worsening BUN and creatinine.  Troponin negative, BNP is slightly elevated at 397 this is lower than september.   stat cxr: "Cardiomegaly with BILATERAL pulmonary opacities representing either edema or pneumonia. Similar appearance priors."  She is put On bipap, started on nitro drip due to hypertension, she is given lasix 160 mg with no urine output in the ED.  She is pace program/ internal medicine patient, hospitalist called due to patient need to admit to stepdown unit.     Review of Systems:  Detail per HPI, Review of systems are otherwise negative  Past Medical History:  Diagnosis Date  . Arthritis   . Breast cancer ([place])   . CAD (coronary artery disease)   . Chest pain   . Dizziness   . DM (diabetes mellitus) ([place])   . GERD (gastroesophageal reflux disease)   . HLD (hyperlipidemia)   . HTN (hypertension)   . Obesity   . Radiation 09/11/15-10/13/15   42.72 Gy to right breast, 12 Gy boost to right breast  . Shortness of breath dyspnea   . Stroke ([place])   . UTI (urinary tract infection)    Past Surgical History:  Procedure Laterality Date  . CHOLECYSTECTOMY    . colon polyps; hx    . CORONARY ANGIOPLASTY  02/14/09   Social History:   reports that  has never smoked. she has never used smokeless tobacco. She reports that she does not drink alcohol or use drugs. Patient lives at home , participate pace program , use walker and wheelchair at baseline  No Known Allergies  Family History  Problem Relation Age of Onset  . Diabetes Mother   . Colon cancer Father        also had lung  . Diabetes Unknown   . Heart attack Unknown   . Diabetes Sister        [name]  . Breast cancer Sister       Prior to Admission medications   Medication Sig Start Date End Date Taking? Authorizing Provider  amLODipine (NORVASC) 10 MG tablet Take 10 mg by mouth daily. Reported on 10/31/2015   Yes [provider]  aspirin 81 MG chewable tablet Chew 81 mg by mouth daily.   Yes [provider]  carvedilol (COREG) 12.5 MG tablet Take 1 tablet (12.5 mg total) by mouth 2 (two) times daily with a meal. 01/06/17  Yes Regalado, Belkys A, MD  cetirizine (ZYRTEC) 10 MG tablet Take 10 mg daily by mouth.   Yes [provider]  citalopram (CELEXA) 20 MG tablet Take 20 mg by mouth daily.   Yes [provider]  cloNIDine (CATAPRES) 0.2 MG tablet Take 0.2 mg by mouth 2 (two) times daily.    Yes [provider]  clopidogrel (PLAVIX) 75 MG tablet  Take 75 mg by mouth daily.    Yes [provider]  [facility] RANITIDINE 75 MG tablet Take 75 mg 2 (two) times daily by mouth. 01/06/17  Yes [provider]  Famotidine (PEPCID AC PO) Take 2 tablets at bedtime as needed by mouth (ACID REFLUX).   Yes [provider]  furosemide (LASIX) 80 MG tablet Take 2 tablets (160 mg total) by mouth 3 (three) times daily. Patient taking differently: Take 160 mg 2 (two) times daily by mouth.  01/06/17  Yes Regalado, Belkys A, MD  HUMALOG MIX 50/50 KWIKPEN (50-50) 100 UNIT/ML Kwikpen Inject 16 Units into the skin. Before breakfast and 16 units before supper per Dr. Buddy Duty. Don's give if not eat meal 01/06/17  Yes [provider]  isosorbide-hydrALAZINE (BIDIL) 20-37.5 MG tablet Take 2 tablets by mouth 3 (three) times daily. 01/06/17  Yes Regalado, Belkys A, MD  pravastatin (PRAVACHOL) 80 MG tablet Take 80 mg by mouth daily.   Yes [provider]  insulin detemir (LEVEMIR) 100 UNIT/ML injection Inject 0.12 mLs (12 Units total) into the skin at bedtime. Patient not taking: Reported on 02/28/2017 01/06/17   Regalado, Jerald Kief A, MD  Nutritional Supplements (FEEDING SUPPLEMENT, NEPRO CARB STEADY,) LIQD Take 237 mLs by mouth 2 (two) times daily between meals. Patient not taking: Reported on 02/28/2017 01/06/17   Elmarie Shiley, MD    Physical Exam: BP (!) 194/64   Pulse 72   Temp 98 F (36.7 C) (Oral)   Resp 18   Ht 5\' 7"  (1.702 m)   Wt 78.5 kg (173 lb)   SpO2 97%   BMI 27.10 kg/m   General: Better on BiPAP Eyes: PERRL ENT: unremarkable Neck: supple, + JVD Cardiovascular: RRR Respiratory: some wheezing, rales, no rhonchi Abdomen: soft/NT/ND, positive bowel sounds Skin: no rash Musculoskeletal:  Bilateral lower extremity edema, left arm edema Psychiatric: calm/cooperative Neurologic: aaox3, moving all extremities          Labs on Admission:  Basic Metabolic Panel: Recent Labs  Lab 02/28/17 1054  NA 139  K 3.6  CL 100*  CO2 25  GLUCOSE 302*  BUN 62*  CREATININE 2.81*  CALCIUM 8.5*   Liver Function Tests: Recent Labs  Lab 02/28/17 1054  AST 20  ALT 13*  ALKPHOS 116  BILITOT 0.6  PROT 7.4  ALBUMIN 3.4*   No results for input(s): LIPASE, AMYLASE in the last 168 hours. No results for input(s): AMMONIA in the last 168 hours. CBC: Recent Labs  Lab 02/28/17 1054  WBC 8.0  HGB 9.4*  HCT 29.9*  MCV 87.4  PLT 185   Cardiac Enzymes: Recent Labs  Lab 02/28/17 1054  TROPONINI <0.03    BNP (last 3 results) Recent Labs    01/02/17 0916 01/03/17 0018 02/28/17 1054  BNP 685.4* 802.0* 397.1*    ProBNP (last 3 results) No results for input(s): PROBNP in the  last 8760 hours.  CBG: No results for input(s): GLUCAP in the last 168 hours.  Radiological Exams on Admission: Dg Chest Port 1 View  Result Date: 02/28/2017 CLINICAL DATA:  Shortness of breath.  Coronary artery disease. EXAM: PORTABLE CHEST 1 VIEW COMPARISON:  01/02/2017. FINDINGS: The heart is enlarged. There is calcification of the thoracic aorta. Low lung volumes. BILATERAL pulmonary opacities could represent edema or pneumonia. Similar appearance to priors except for slightly improved aeration. IMPRESSION: Cardiomegaly with BILATERAL pulmonary opacities representing either edema or pneumonia. Similar appearance priors. Electronically Signed   By: Madie Reno  Curnes M.D.   On: 02/28/2017 11:42    EKG: Independently reviewed.  Sinus rhythm, no acute ST-T changes, QTC prolonged at 525  Assessment/Plan Present on Admission: **None**  Acute hypoxic respiratory failure Requiring BiPAP ,admit to stepdown From  flush pulmonary edema in  The setting of  hypertension urgency versus acute on chronic diastolic CHF  Although patient BNP has been lower than before, first set of troponin negative, continue to cycle troponin Could also be component of fluid overload in the setting of CKD 4 IV Lasix 160 mg 3 times daily, case discussed with nephrology Dr. Posey Pronto who will follow the patient  HTN urgency: Continue nitroglycerin drip (Per pmd her blood pressure at baseline around 160)   AKI on CKDIV, worsening of BUN and creatinine, volume overload,  ua pending, foley placed, defer to nephrology  Anemia of chronic disease, Hgb at baseline  Insulin dependent 2 diabetes, check A1c, continue Levemir ,start SSI,   History of CVA, continue aspirin Plavix and statin.  H/o Breast cancer of upper-outer quadrant of right female breast ([place])   DVT prophylaxis: heparin  Consultants:  nephrology  Code Status: full   Family Communication:  Patient and duaghter  Disposition Plan: admit to  stepdown  Total Critical Care time in examining the patient bedside, evaluating Lab work and other data, over half of the total time was spent in coordinating patient care on the floor or bedisde in talking to patient/family members, communicating with nursing Staff on the floor and sub specialists  Nephrology to coordinate patients medical care and needs is 21 Minutes.  The condition which has caused critical injury/acute impairment of  vital organ system with a high probability of sudden clinically significant deterioration and can cause Potential Life threatening injury to this patient addressed today is hypertension urgency, respiratory failure, aki on ckd IV    Carolena Fairbank MD, PhD [facility] Pager [phone] If 7PM-7AM, please contact night-coverage at [email or web address], password [facility]

## 2017-02-28 NOTE — Consult Note (Signed)
Reason for Consult: Acute kidney injury on chronic kidney disease stage IV, volume overload Referring Physician: Florencia Reasons M.D. [facility])  HPI: (Although the patient is on BiPAP, she is able to provide history with limitations) 83 year old African-American woman with past medical history significant for hypertension, type 2 diabetes mellitus, history of multiple CVAs with residual motor deficit, history of breast cancer status post right lumpectomy in 2017 with adjuvant radiation and advancing chronic kidney disease-most recently chronic kidney disease stage IV with a baseline creatinine of around 2.3 when last seen as an outpatient by Dr. Madelon Lips at [facility] 2 months ago.  It appears that she was recently admitted to the hospital since that last visit and followed up with the extender clinic at [facility] when additional adjustments were made to diuretic therapy (furosemide adjusted to 160 mg by mouth twice a day) after noting an elevated creatinine of 3.6 on 02/03/17. Earlier today, she developed relatively acute onset of worsening of shortness of breath after rather unremarkable weekend without any preceding fever, chills, cough, sputum production, hemoptysis or significant orthopnea. She reports her leg swelling as being rather unchanged. She denies any nausea, vomiting, diarrhea, dysgeusia, melena or hematemesis. She denies any dysuria, urgency, frequency, flank pain or hematuria. She denies any clear indiscretions with sodium intake in her diet.  In the emergency room, she is found to be in hypoxic respiratory failure and currently on BiPAP. She was also found to have acute kidney injury on chronic kidney disease stage IV with a creatinine of 2.8 which is higher than her normal baseline.  Past Medical History:  Diagnosis Date  . Arthritis   . Breast cancer ([place])   . CAD (coronary artery disease)   . Chest pain   . Dizziness   . DM (diabetes mellitus) ([place])   . GERD  (gastroesophageal reflux disease)   . HLD (hyperlipidemia)   . HTN (hypertension)   . Obesity   . Radiation 09/11/15-10/13/15   42.72 Gy to right breast, 12 Gy boost to right breast  . Shortness of breath dyspnea   . Stroke ([place])   . UTI (urinary tract infection)     Past Surgical History:  Procedure Laterality Date  . CHOLECYSTECTOMY    . colon polyps; hx    . CORONARY ANGIOPLASTY  02/14/09    Family History  Problem Relation Age of Onset  . Diabetes Mother   . Colon cancer Father        also had lung  . Diabetes Unknown   . Heart attack Unknown   . Diabetes Sister        [name]  . Breast cancer Sister     Social History:  reports that  has never smoked. she has never used smokeless tobacco. She reports that she does not drink alcohol or use drugs.  Allergies: No Known Allergies  Medications: I have reviewed the patient's current medications.   BMP Latest Ref Rng & Units 02/28/2017 01/06/2017 01/05/2017  Glucose 65 - 99 mg/dL 302(H) 67 129(H)  BUN 6 - 20 mg/dL 62(H) 37(H) 37(H)  Creatinine 0.44 - 1.00 mg/dL 2.81(H) 2.14(H) 2.20(H)  Sodium 135 - 145 mmol/L 139 142 138  Potassium 3.5 - 5.1 mmol/L 3.6 3.6 4.0  Chloride 101 - 111 mmol/L 100(L) 103 100(L)  CO2 22 - 32 mmol/L 25 31 29   Calcium 8.9 - 10.3 mg/dL 8.5(L) 8.6(L) 8.6(L)   CBC Latest Ref Rng & Units 02/28/2017 01/06/2017 01/05/2017  WBC 4.0 - 10.5 K/uL  8.0 - 3.9(L)  Hemoglobin 12.0 - 15.0 g/dL 9.4(L) 8.1(L) 7.8(L)  Hematocrit 36.0 - 46.0 % 29.9(L) 25.2(L) 24.5(L)  Platelets 150 - 400 K/uL 185 - 191    Dg Chest Port 1 View  Result Date: 02/28/2017 CLINICAL DATA:  Shortness of breath.  Coronary artery disease. EXAM: PORTABLE CHEST 1 VIEW COMPARISON:  01/02/2017. FINDINGS: The heart is enlarged. There is calcification of the thoracic aorta. Low lung volumes. BILATERAL pulmonary opacities could represent edema or pneumonia. Similar appearance to priors except for slightly improved aeration. IMPRESSION: Cardiomegaly with  BILATERAL pulmonary opacities representing either edema or pneumonia. Similar appearance priors. Electronically Signed   By: Staci Righter M.D.   On: 02/28/2017 11:42    Review of Systems  Constitutional: Negative for chills, fever and malaise/fatigue.  HENT: Negative.   Eyes: Negative.   Respiratory: Positive for shortness of breath. Negative for cough and sputum production.   Cardiovascular: Positive for leg swelling. Negative for chest pain, palpitations and orthopnea.  Gastrointestinal: Negative.   Genitourinary: Negative.   Skin: Negative.   Neurological: Positive for headaches. Negative for dizziness and focal weakness.       Report some headaches on and off over the weekend   Blood pressure (!) 142/80, pulse 73, temperature 98 F (36.7 C), temperature source Oral, resp. rate 15, height 5\' 7"  (1.702 m), weight 78.5 kg (173 lb), SpO2 98 %. Physical Exam  Nursing note and vitals reviewed. Constitutional: She appears well-developed and well-nourished. No distress.  Appears surprisingly comfortable on BiPAP  HENT:  Head: Normocephalic and atraumatic.  Eyes: Conjunctivae and EOM are normal. No scleral icterus.  Neck: Normal range of motion. JVD present. No tracheal deviation present. No thyromegaly present.  Cardiovascular: Normal rate, regular rhythm and normal heart sounds.  No murmur heard. Respiratory: Effort normal. She has wheezes. She has rales.  Find rales over both bases with intermittent expiratory wheeze  GI: Soft. Bowel sounds are normal. There is no tenderness. There is no rebound and no guarding.  Musculoskeletal: She exhibits edema.  2+-3+ bilateral pitting edema over her legs  Neurological: She is alert.  Skin: Skin is warm and dry. No rash noted. No erythema.    Assessment/Plan: 1. Acute hypoxic respiratory failure: Based on the history, tendon of events and available database, appears to be most consistent with acute CHF exacerbation. Agree with noninvasive  positive pressure ventilation and efforts at diuresis/volume unloading to help alleviate pulmonary edema and improved respiratory status. 2. Acute kidney injury on chronic kidney disease stage IV: Available data pointing to hemodynamically mediated acute kidney injury in the face of acute CHF exacerbation with renal hypoperfusion. The plan at this time is for placement of a Foley catheter for strict monitoring of input and output and sending off for urine electrolytes to corroborate clinical suspicion. Recommend furosemide 160 mg IV 3 times a day at this time to promote diuresis-may need to add metolazone to overcome diuretic resistance in the next 24 hours if urine output not satisfactory. If she remains truly refractory to diuretic management, hemodialysis will be the next option. 3. Hypertension: Most likely secondary to volume overload-monitor with aggressive diuretic therapy and resume oral antihypertensive agents-currently on nitroglycerin drip. 4. Anemia of chronic kidney disease: We'll check iron studies with next labs, she denies any overt loss. Hemoglobin drop might be partly explained by her volume overload/dilutional effect. 5. Diabetes mellitus: Management per hospitalist service.  Myrl Lazarus K. 02/28/2017, 3:37 PM

## 2017-02-28 NOTE — ED Notes (Signed)
Placed Pt on [place] catheter. Cleaned Pt prior to catheter placement.

## 2017-02-28 NOTE — ED Notes (Addendum)
NEED 2ND IV SITE FOR LASIX INFUSION. MATT QUICK RN ASSISTING WITH [place] IV. MATT AWARE OF NEED FOR BLOOD. FAMILY PRESENT AND UPDATED

## 2017-02-28 NOTE — ED Provider Notes (Signed)
[name] Provider Note   CSN: [id number] Arrival date & time: 02/28/17  9924     History   Chief Complaint Chief Complaint  Patient presents with  . Shortness of Breath  . Wheezing    HPI Glenda [name] is a 83 y.o. female.  83yo F w/ PMH including CVA, T2DM, CAD who p/w shortness of breath.  Patient again having shortness of breath associated with cough and wheezing this morning.  No associated chest pain or recent travel.  She has not had her medications this morning.  Of note, she was admitted in September for CHF exacerbation.  I discussed her with her primary care provider, Dr. Jimmye Norman, who states they have been working with Dr. Hollie Salk, her nephrologist, regarding her Lasix regimen to find the appropriate balance based on known CKD.   LEVEL 5 CAVEAT DUE TO RESPIRATORY DISTRESS   The history is provided by a relative and the EMS personnel.  Shortness of Breath  Associated symptoms include wheezing.  Wheezing      Past Medical History:  Diagnosis Date  . Arthritis   . Breast cancer ([place])   . CAD (coronary artery disease)   . Chest pain   . Dizziness   . DM (diabetes mellitus) ([place])   . GERD (gastroesophageal reflux disease)   . HLD (hyperlipidemia)   . HTN (hypertension)   . Obesity   . Radiation 09/11/15-10/13/15   42.72 Gy to right breast, 12 Gy boost to right breast  . Shortness of breath dyspnea   . Stroke ([place])   . UTI (urinary tract infection)     Patient Active Problem List   Diagnosis Date Noted  . Hypoxia 02/28/2017  . Chronic kidney disease (CKD), stage IV (severe) ([place]) 01/03/2017  . CHF (congestive heart failure) ([place]) 01/02/2017  . Blood pressure alteration 09/16/2016  . Somnolence, daytime 12/08/2015  . Cerebral thrombosis with cerebral infarction 09/07/2015  . Lacunar infarct, acute   . Nausea & vomiting 09/05/2015  . Type II diabetes mellitus with neurological manifestations, uncontrolled ([place])  09/03/2015  . Bilateral lower extremity edema 09/03/2015  . Essential hypertension   . Diabetes mellitus type 2 in nonobese ([facility])   . History of breast cancer   . AKI (acute kidney injury) ([place])   . Acute blood loss anemia   . Hemiparesis ([place])   . Gait disturbance, post-stroke   . Dysphagia, post-stroke   . Acute ischemic stroke/right cerebellar 08/24/2015  . Breast cancer of upper-outer quadrant of right female breast ([place]) 04/30/2015  . CVA (cerebral infarction) 07/05/2014  . Hyperlipidemia 07/05/2014  . Mild mitral regurgitation by prior echocardiogram 12/20/2011  . Acute on chronic diastolic CHF (congestive heart failure) ([place]) 11/25/2010  . Diabetes mellitus type 1, uncontrolled, insulin dependent ([place]) 02/26/2009  . OBESITY 02/26/2009  . Uncontrolled hypertension 02/26/2009  . CAD (coronary artery disease) 02/26/2009  . GASTROESOPHAGEAL REFLUX DISEASE 02/26/2009    Past Surgical History:  Procedure Laterality Date  . CHOLECYSTECTOMY    . colon polyps; hx    . CORONARY ANGIOPLASTY  02/14/09    OB History    No data available       Home Medications    Prior to Admission medications   Medication Sig Start Date End Date Taking? Authorizing Provider  amLODipine (NORVASC) 10 MG tablet Take 10 mg by mouth daily. Reported on 10/31/2015   Yes [provider]  aspirin 81 MG chewable tablet Chew 81 mg by mouth daily.  Yes [provider]  carvedilol (COREG) 12.5 MG tablet Take 1 tablet (12.5 mg total) by mouth 2 (two) times daily with a meal. 01/06/17  Yes Regalado, Belkys A, MD  cetirizine (ZYRTEC) 10 MG tablet Take 10 mg daily by mouth.   Yes [provider]  citalopram (CELEXA) 20 MG tablet Take 20 mg by mouth daily.   Yes [provider]  cloNIDine (CATAPRES) 0.2 MG tablet Take 0.2 mg by mouth 2 (two) times daily.    Yes [provider]  clopidogrel (PLAVIX) 75 MG tablet Take 75 mg by mouth daily.    Yes [provider]   [facility] RANITIDINE 75 MG tablet Take 75 mg 2 (two) times daily by mouth. 01/06/17  Yes [provider]  Famotidine (PEPCID AC PO) Take 2 tablets at bedtime as needed by mouth (ACID REFLUX).   Yes [provider]  furosemide (LASIX) 80 MG tablet Take 2 tablets (160 mg total) by mouth 3 (three) times daily. Patient taking differently: Take 160 mg 2 (two) times daily by mouth.  01/06/17  Yes Regalado, Belkys A, MD  HUMALOG MIX 50/50 KWIKPEN (50-50) 100 UNIT/ML Kwikpen Inject 16 Units into the skin. Before breakfast and 16 units before supper per Dr. Buddy Duty. Don's give if not eat meal 01/06/17  Yes [provider]  isosorbide-hydrALAZINE (BIDIL) 20-37.5 MG tablet Take 2 tablets by mouth 3 (three) times daily. 01/06/17  Yes Regalado, Belkys A, MD  pravastatin (PRAVACHOL) 80 MG tablet Take 80 mg by mouth daily.   Yes [provider]  insulin detemir (LEVEMIR) 100 UNIT/ML injection Inject 0.12 mLs (12 Units total) into the skin at bedtime. Patient not taking: Reported on 02/28/2017 01/06/17   Regalado, Jerald Kief A, MD  Nutritional Supplements (FEEDING SUPPLEMENT, NEPRO CARB STEADY,) LIQD Take 237 mLs by mouth 2 (two) times daily between meals. Patient not taking: Reported on 02/28/2017 01/06/17   Elmarie Shiley, MD    Family History Family History  Problem Relation Age of Onset  . Diabetes Mother   . Colon cancer Father        also had lung  . Diabetes Unknown   . Heart attack Unknown   . Diabetes Sister        [name]  . Breast cancer Sister     Social History Social History   Tobacco Use  . Smoking status: Never Smoker  . Smokeless tobacco: Never Used  Substance Use Topics  . Alcohol use: No    Alcohol/week: 0.0 oz  . Drug use: No     Allergies   Patient has no known allergies.   Review of Systems Review of Systems  Unable to perform ROS: Severe respiratory distress  Respiratory: Positive for shortness of breath and wheezing.      Physical  Exam Updated Vital Signs BP (!) 149/56   Pulse 73   Temp 98 F (36.7 C) (Oral)   Resp 15   Ht 5\' 7"  (1.702 m)   Wt 78.5 kg (173 lb)   SpO2 98%   BMI 27.10 kg/m   Physical Exam  Constitutional: She is oriented to person, place, and time. She appears well-developed and well-nourished. She appears ill.  In respiratory distress  HENT:  Head: Normocephalic and atraumatic.  Eyes: Conjunctivae are normal. Pupils are equal, round, and reactive to light.  Neck: Neck supple.  Cardiovascular: Normal rate, regular rhythm and normal heart sounds.  No murmur heard. Pulmonary/Chest: Accessory muscle usage present. Tachypnea noted. She is  in respiratory distress.  Severely diminished BS b/l with crackles  Abdominal: Soft. Bowel sounds are normal. She exhibits no distension. There is no tenderness.  Musculoskeletal:       Right lower leg: She exhibits edema.       Left lower leg: She exhibits edema.  2+ pitting edema BLE  Neurological: She is alert and oriented to person, place, and time.  Fluent speech  Skin: Skin is warm and dry.  Psychiatric: Judgment normal.  Nursing note and vitals reviewed.    ED Treatments / Results  Labs (all labs ordered are listed, but only abnormal results are displayed) Labs Reviewed  COMPREHENSIVE METABOLIC PANEL - Abnormal; Notable for the following components:      Result Value   Chloride 100 (*)    Glucose, Bld 302 (*)    BUN 62 (*)    Creatinine, Ser 2.81 (*)    Calcium 8.5 (*)    Albumin 3.4 (*)    ALT 13 (*)    GFR calc non Af Amer 15 (*)    GFR calc Af Amer 17 (*)    All other components within normal limits  CBC - Abnormal; Notable for the following components:   RBC 3.42 (*)    Hemoglobin 9.4 (*)    HCT 29.9 (*)    All other components within normal limits  BRAIN NATRIURETIC PEPTIDE - Abnormal; Notable for the following components:   B Natriuretic Peptide 397.1 (*)    All other components within normal limits  BLOOD GAS, VENOUS -  Abnormal; Notable for the following components:   pO2, Ven 52.1 (*)    All other components within normal limits  TROPONIN I  TROPONIN I  TROPONIN I  TROPONIN I  CREATININE, URINE, RANDOM  SODIUM, URINE, RANDOM  UREA NITROGEN, URINE  URINALYSIS, ROUTINE W REFLEX MICROSCOPIC  RENAL FUNCTION PANEL  MAGNESIUM  IRON AND TIBC  FERRITIN  CBG MONITORING, ED  I-STAT CG4 LACTIC ACID, ED    EKG  EKG Interpretation  Date/Time:  Monday February 28 2017 10:09:10 EST Ventricular Rate:  71 PR Interval:    QRS Duration: 95 QT Interval:  483 QTC Calculation: 525 R Axis:   69 Text Interpretation:  Sinus rhythm Borderline repolarization abnormality Prolonged QT interval Baseline wander in lead(s) V3 No significant change since last tracing Confirmed by Theotis Burrow [phone]) on 02/28/2017 11:02:46 AM       Radiology Dg Chest Port 1 View  Result Date: 02/28/2017 CLINICAL DATA:  Shortness of breath.  Coronary artery disease. EXAM: PORTABLE CHEST 1 VIEW COMPARISON:  01/02/2017. FINDINGS: The heart is enlarged. There is calcification of the thoracic aorta. Low lung volumes. BILATERAL pulmonary opacities could represent edema or pneumonia. Similar appearance to priors except for slightly improved aeration. IMPRESSION: Cardiomegaly with BILATERAL pulmonary opacities representing either edema or pneumonia. Similar appearance priors. Electronically Signed   By: Staci Righter M.D.   On: 02/28/2017 11:42    Procedures .Critical Care Performed by: Sharlett Iles, MD Authorized by: Sharlett Iles, MD   Critical care provider statement:    Critical care time (minutes):  45   Critical care time was exclusive of:  Separately billable procedures and treating other patients   Critical care was necessary to treat or prevent imminent or life-threatening deterioration of the following conditions:  Respiratory failure   Critical care was time spent personally by me on the following activities:   Development of treatment plan with patient or surrogate, discussions  with consultants, evaluation of patient's response to treatment, examination of patient, obtaining history from patient or surrogate, ordering and performing treatments and interventions, ordering and review of laboratory studies, ordering and review of radiographic studies, re-evaluation of patient's condition and review of old charts   (including critical care time)  Medications Ordered in ED Medications  nitroGLYCERIN 50 mg in dextrose 5 % 250 mL (0.2 mg/mL) infusion (33.333 mcg/min Intravenous Transfusing/Transfer 02/28/17 1451)  furosemide (LASIX) 160 mg in dextrose 5 % 50 mL IVPB (160 mg Intravenous New Bag/Given 02/28/17 1702)  albuterol (PROVENTIL) (2.5 MG/3ML) 0.083% nebulizer solution 5 mg (5 mg Nebulization Given 02/28/17 1030)  nitroGLYCERIN (NITROGLYN) 2 % ointment 1 inch (1 inch Topical Given 02/28/17 1110)  furosemide (LASIX) 160 mg in dextrose 5 % 50 mL IVPB (0 mg Intravenous Stopped 02/28/17 1211)     Initial Impression / Assessment and Plan / ED Course  I have reviewed the triage vital signs and the nursing notes.  Pertinent labs & imaging results that were available during my care of the patient were reviewed by me and considered in my medical decision making (see chart for details).    PT w/ SOB since this morning.  She was hypoxic at 73% on room air on arrival and was immediately placed on supplemental oxygen and DuoNeb.  I did not appreciate significant wheezing but did note crackles and she had peripheral edema.  Initially placed nitro paste and eventually started a nitroglycerin drip.  Gave the patient 160 mg IV Lasix and placed on BiPAP.  She had significant improvement in her work of breathing and hypoxia on the BiPAP and FiO2 titrated to 30%.  Creatinine slightly elevated from baseline at 2.8 today, normal lactate, hemoglobin 9.4, BNP 397, negative troponin, VBG reassuring. CXR with pulmonary edema.  Discussed admission with [facility], Dr. Erlinda Hong, given pt needs stepdown and is not stable for transfer to [facility] for internal medicine admission. Per Dr. Phoebe Sharps request, also contacted nephrology, Dr. Posey Pronto, who will consult on pt. Pt admitted to stepdown for further care.  Final Clinical Impressions(s) / ED Diagnoses   Final diagnoses:  None    ED Discharge Orders    None       Little, Wenda Overland, MD 02/28/17 1722

## 2017-02-28 NOTE — ED Notes (Signed)
Bed: WA17 Expected date:  Expected time:  Means of arrival:  Comments: tri

## 2017-02-28 NOTE — ED Notes (Signed)
PT DID NOT TAKE MEDICATIONS THIS AM- WHICH INCLUDED HER BP AND INSULIN. PT DID NOT EAT THIS AM

## 2017-03-01 ENCOUNTER — Inpatient Hospital Stay (HOSPITAL_COMMUNITY): Payer: Medicare (Managed Care)

## 2017-03-01 DIAGNOSIS — M7989 Other specified soft tissue disorders: Secondary | ICD-10-CM

## 2017-03-01 DIAGNOSIS — J9601 Acute respiratory failure with hypoxia: Secondary | ICD-10-CM

## 2017-03-01 DIAGNOSIS — N179 Acute kidney failure, unspecified: Secondary | ICD-10-CM

## 2017-03-01 DIAGNOSIS — I5033 Acute on chronic diastolic (congestive) heart failure: Secondary | ICD-10-CM

## 2017-03-01 DIAGNOSIS — E876 Hypokalemia: Secondary | ICD-10-CM

## 2017-03-01 DIAGNOSIS — N189 Chronic kidney disease, unspecified: Secondary | ICD-10-CM

## 2017-03-01 LAB — HEMOGLOBIN A1C
Hgb A1c MFr Bld: 9.6 % — ABNORMAL HIGH (ref 4.8–5.6)
MEAN PLASMA GLUCOSE: 228.82 mg/dL

## 2017-03-01 LAB — IRON AND TIBC
Iron: 26 ug/dL — ABNORMAL LOW (ref 28–170)
SATURATION RATIOS: 13 % (ref 10.4–31.8)
TIBC: 203 ug/dL — ABNORMAL LOW (ref 250–450)
UIBC: 177 ug/dL

## 2017-03-01 LAB — RENAL FUNCTION PANEL
ALBUMIN: 3.1 g/dL — AB (ref 3.5–5.0)
Anion gap: 11 (ref 5–15)
BUN: 63 mg/dL — AB (ref 6–20)
CO2: 27 mmol/L (ref 22–32)
Calcium: 8.5 mg/dL — ABNORMAL LOW (ref 8.9–10.3)
Chloride: 102 mmol/L (ref 101–111)
Creatinine, Ser: 2.57 mg/dL — ABNORMAL HIGH (ref 0.44–1.00)
GFR calc Af Amer: 19 mL/min — ABNORMAL LOW (ref >60)
GFR, EST NON AFRICAN AMERICAN: 16 mL/min — AB (ref >60)
Glucose, Bld: 239 mg/dL — ABNORMAL HIGH (ref 65–99)
PHOSPHORUS: 4 mg/dL (ref 2.5–4.6)
Potassium: 3.3 mmol/L — ABNORMAL LOW (ref 3.5–5.1)
SODIUM: 140 mmol/L (ref 135–145)

## 2017-03-01 LAB — BASIC METABOLIC PANEL
Anion gap: 12 (ref 5–15)
BUN: 63 mg/dL — ABNORMAL HIGH (ref 6–20)
CHLORIDE: 101 mmol/L (ref 101–111)
CO2: 27 mmol/L (ref 22–32)
CREATININE: 2.52 mg/dL — AB (ref 0.44–1.00)
Calcium: 8.4 mg/dL — ABNORMAL LOW (ref 8.9–10.3)
GFR calc non Af Amer: 17 mL/min — ABNORMAL LOW (ref >60)
GFR, EST AFRICAN AMERICAN: 19 mL/min — AB (ref >60)
Glucose, Bld: 239 mg/dL — ABNORMAL HIGH (ref 65–99)
Potassium: 3.2 mmol/L — ABNORMAL LOW (ref 3.5–5.1)
Sodium: 140 mmol/L (ref 135–145)

## 2017-03-01 LAB — URINALYSIS, ROUTINE W REFLEX MICROSCOPIC
Bilirubin Urine: NEGATIVE
Glucose, UA: NEGATIVE mg/dL
Hgb urine dipstick: NEGATIVE
KETONES UR: NEGATIVE mg/dL
Leukocytes, UA: NEGATIVE
Nitrite: NEGATIVE
PROTEIN: 100 mg/dL — AB
Specific Gravity, Urine: 1.011 (ref 1.005–1.030)
pH: 5 (ref 5.0–8.0)

## 2017-03-01 LAB — GLUCOSE, CAPILLARY
GLUCOSE-CAPILLARY: 140 mg/dL — AB (ref 65–99)
Glucose-Capillary: 246 mg/dL — ABNORMAL HIGH (ref 65–99)
Glucose-Capillary: 249 mg/dL — ABNORMAL HIGH (ref 65–99)
Glucose-Capillary: 224 mg/dL — ABNORMAL HIGH (ref 65–99)
Glucose-Capillary: 200 mg/dL — ABNORMAL HIGH (ref 65–99)

## 2017-03-01 LAB — MAGNESIUM: MAGNESIUM: 1.8 mg/dL (ref 1.7–2.4)

## 2017-03-01 LAB — FERRITIN: Ferritin: 317 ng/mL — ABNORMAL HIGH (ref 11–307)

## 2017-03-01 LAB — CREATININE, URINE, RANDOM: Creatinine, Urine: 79.74 mg/dL

## 2017-03-01 LAB — TROPONIN I
Troponin I: 0.37 ng/mL (ref <0.03)
Troponin I: 0.52 ng/mL (ref <0.03)

## 2017-03-01 LAB — SODIUM, URINE, RANDOM: SODIUM UR: 86 mmol/L

## 2017-03-01 LAB — URIC ACID: URIC ACID, SERUM: 8.7 mg/dL — AB (ref 2.3–6.6)

## 2017-03-01 MED ORDER — POTASSIUM CHLORIDE CRYS ER 20 MEQ PO TBCR
40.0000 meq | EXTENDED_RELEASE_TABLET | Freq: Every day | ORAL | Status: DC
  Administered 2017-03-01 – 2017-03-03 (×3): 40 meq via ORAL
  Filled 2017-03-01 (×3): qty 2

## 2017-03-01 MED ORDER — METOLAZONE 5 MG PO TABS
5.0000 mg | ORAL_TABLET | Freq: Once | ORAL | Status: AC
  Administered 2017-03-01: 5 mg via ORAL
  Filled 2017-03-01: qty 1

## 2017-03-01 MED ORDER — ALLOPURINOL 100 MG PO TABS
100.0000 mg | ORAL_TABLET | Freq: Every day | ORAL | Status: DC
  Administered 2017-03-01 – 2017-03-06 (×6): 100 mg via ORAL
  Filled 2017-03-01 (×6): qty 1

## 2017-03-01 MED ORDER — SODIUM CHLORIDE 0.9 % IV SOLN
510.0000 mg | Freq: Once | INTRAVENOUS | Status: AC
  Administered 2017-03-01: 510 mg via INTRAVENOUS
  Filled 2017-03-01: qty 17

## 2017-03-01 MED ORDER — METOLAZONE 5 MG PO TABS
5.0000 mg | ORAL_TABLET | Freq: Two times a day (BID) | ORAL | Status: AC
  Administered 2017-03-01 – 2017-03-03 (×4): 5 mg via ORAL
  Filled 2017-03-01 (×4): qty 1

## 2017-03-01 MED ORDER — ACETAMINOPHEN 325 MG PO TABS
650.0000 mg | ORAL_TABLET | Freq: Four times a day (QID) | ORAL | Status: DC | PRN
  Administered 2017-03-01: 650 mg via ORAL
  Filled 2017-03-01: qty 2

## 2017-03-01 MED ORDER — ISOSORB DINITRATE-HYDRALAZINE 20-37.5 MG PO TABS
2.0000 | ORAL_TABLET | Freq: Three times a day (TID) | ORAL | Status: DC
  Administered 2017-03-01 – 2017-03-06 (×17): 2 via ORAL
  Filled 2017-03-01 (×19): qty 2

## 2017-03-01 NOTE — Consult Note (Signed)
Referring Physician:   [name] is an 83 y.o. female.                       Chief Complaint: Shortness of breath  HPI: 83 year old female with PMH of HTN, type 2 DM, multiple strokes with right upper extremity weakness, h/o breast cancer, Acute on chronic kidney injury has shortness of breath with pulmonary vascular congestion. She is started on lasix drip and nitro drip with some diuresis. Echocardiogram 2 months ago showed 60-70 % EF with Grade 3 diastolic dysfunction.  Past Medical History:  Diagnosis Date  . Arthritis   . Breast cancer (Glenda [name])   . CAD (coronary artery disease)   . Chest pain   . Dizziness   . DM (diabetes mellitus) (Glenda [name])   . GERD (gastroesophageal reflux disease)   . HLD (hyperlipidemia)   . HTN (hypertension)   . Obesity   . Radiation 09/11/15-10/13/15   42.72 Gy to right breast, 12 Gy boost to right breast  . Shortness of breath dyspnea   . Stroke (Glenda [name])   . UTI (urinary tract infection)       Past Surgical History:  Procedure Laterality Date  . CHOLECYSTECTOMY    . colon polyps; hx    . CORONARY ANGIOPLASTY  02/14/09    Family History  Problem Relation Age of Onset  . Diabetes Mother   . Colon cancer Father        also had lung  . Diabetes Unknown   . Heart attack Unknown   . Diabetes Sister        Glenda [name]  . Breast cancer Sister    Social History:  reports that  has never smoked. she has never used smokeless tobacco. She reports that she does not drink alcohol or use drugs.  Allergies: No Known Allergies  Medications Prior to Admission  Medication Sig Dispense Refill  . amLODipine (NORVASC) 10 MG tablet Take 10 mg by mouth daily. Reported on 10/31/2015    . aspirin 81 MG chewable tablet Chew 81 mg by mouth daily.    . carvedilol (COREG) 12.5 MG tablet Take 1 tablet (12.5 mg total) by mouth 2 (two) times daily with a meal. 60 tablet 0  . cetirizine (ZYRTEC) 10 MG tablet Take 10 mg daily by mouth.    . citalopram (CELEXA) 20 MG tablet Take 20  mg by mouth daily.    . cloNIDine (CATAPRES) 0.2 MG tablet Take 0.2 mg by mouth 2 (two) times daily.     . clopidogrel (PLAVIX) 75 MG tablet Take 75 mg by mouth daily.     . Glenda [name] 75 MG tablet Take 75 mg 2 (two) times daily by mouth.  12  . Famotidine (PEPCID AC PO) Take 2 tablets at bedtime as needed by mouth (ACID REFLUX).    . furosemide (LASIX) 80 MG tablet Take 2 tablets (160 mg total) by mouth 3 (three) times daily. (Patient taking differently: Take 160 mg 2 (two) times daily by mouth. ) 120 tablet 0  . HUMALOG MIX 50/50 KWIKPEN (50-50) 100 UNIT/ML Kwikpen Inject 16 Units into the skin. Before breakfast and 16 units before supper per Dr. Buddy [name]. Don's give if not eat meal  11  . isosorbide-hydrALAZINE (BIDIL) 20-37.5 MG tablet Take 2 tablets by mouth 3 (three) times daily. 120 tablet 0  . pravastatin (PRAVACHOL) 80 MG tablet Take 80 mg by mouth daily.    . insulin detemir (LEVEMIR) 100 UNIT/ML  injection Inject 0.12 mLs (12 Units total) into the skin at bedtime. (Patient not taking: Reported on 02/28/2017) 10 mL 11  . Glenda [name] (FEEDING SUPPLEMENT, Glenda [name],) LIQD Take 237 mLs by mouth 2 (two) times daily between meals. (Patient not taking: Reported on 02/28/2017) 30 Can 0    Results for orders placed or performed during the hospital encounter of 02/28/17 (from the past 48 hour(s))  Blood gas, venous     Status: Abnormal   Collection Time: 02/28/17 10:40 AM  Result Value Ref Range   O2 Content 2.0 L/min   Delivery systems NASAL CANNULA    pH, Ven 7.333 7.250 - 7.430   pCO2, Ven 51.8 44.0 - 60.0 mmHg   pO2, Ven 52.1 (H) 32.0 - 45.0 mmHg   Bicarbonate 26.7 20.0 - 28.0 mmol/L   Acid-Base Excess 0.9 0.0 - 2.0 mmol/L   O2 Saturation 79.7 %   Patient temperature 98.6    Collection site VEIN    Drawn by DRAWN BY RN    Sample type VEIN   Comprehensive metabolic panel     Status: Abnormal   Collection Time: 02/28/17 10:54 AM  Result Value Ref Range   Sodium 139  135 - 145 mmol/L   Potassium 3.6 3.5 - 5.1 mmol/L   Chloride 100 (L) 101 - 111 mmol/L   CO2 25 22 - 32 mmol/L   Glucose, Bld 302 (H) 65 - 99 mg/dL   BUN 62 (H) 6 - 20 mg/dL   Creatinine, Ser 2.81 (H) 0.44 - 1.00 mg/dL   Calcium 8.5 (L) 8.9 - 10.3 mg/dL   Total Protein 7.4 6.5 - 8.1 g/dL   Albumin 3.4 (L) 3.5 - 5.0 g/dL   AST 20 15 - 41 U/L   ALT 13 (L) 14 - 54 U/L   Alkaline Phosphatase 116 38 - 126 U/L   Total Bilirubin 0.6 0.3 - 1.2 mg/dL   GFR calc non Af Amer 15 (L) >60 mL/min   GFR calc Af Amer 17 (L) >60 mL/min    Comment: (NOTE) The eGFR has been calculated using the CKD EPI equation. This calculation has not been validated in all clinical situations. eGFR's persistently <60 mL/min signify possible Chronic Kidney Disease.    Anion gap 14 5 - 15  CBC     Status: Abnormal   Collection Time: 02/28/17 10:54 AM  Result Value Ref Range   WBC 8.0 4.0 - 10.5 K/uL   RBC 3.42 (L) 3.87 - 5.11 MIL/uL   Hemoglobin 9.4 (L) 12.0 - 15.0 g/dL   HCT 29.9 (L) 36.0 - 46.0 %   MCV 87.4 78.0 - 100.0 fL   MCH 27.5 26.0 - 34.0 pg   MCHC 31.4 30.0 - 36.0 g/dL   RDW 14.4 11.5 - 15.5 %   Platelets 185 150 - 400 K/uL  Brain natriuretic peptide     Status: Abnormal   Collection Time: 02/28/17 10:54 AM  Result Value Ref Range   B Natriuretic Peptide 397.1 (H) 0.0 - 100.0 pg/mL  Troponin I     Status: None   Collection Time: 02/28/17 10:54 AM  Result Value Ref Range   Troponin I <0.03 <0.03 ng/mL  I-Stat CG4 Lactic Acid, ED     Status: None   Collection Time: 02/28/17 10:58 AM  Result Value Ref Range   Lactic Acid, Venous 0.88 0.5 - 1.9 mmol/L  Troponin I (q 6hr x 3)     Status: Abnormal   Collection Time:  02/28/17  5:06 PM  Result Value Ref Range   Troponin I 0.37 (HH) <0.03 ng/mL    Comment: CRITICAL RESULT CALLED TO, READ BACK BY AND VERIFIED WITH: C.HALL AT 1806 ON 02/28/17 BY N.THOMPSON DELTA CHECK NOTED   Glucose, capillary     Status: Abnormal   Collection Time: 02/28/17  9:28  PM  Result Value Ref Range   Glucose-Capillary 276 (H) 65 - 99 mg/dL   Comment 1 Notify RN    Comment 2 Document in Chart   MRSA PCR Screening     Status: None   Collection Time: 02/28/17  9:39 PM  Result Value Ref Range   MRSA by PCR NEGATIVE NEGATIVE    Comment:        The GeneXpert MRSA Assay (FDA approved for NASAL specimens only), is one component of a comprehensive MRSA colonization surveillance program. It is not intended to diagnose MRSA infection nor to guide or monitor treatment for MRSA infections.   Troponin I (q 6hr x 3)     Status: Abnormal   Collection Time: 02/28/17 11:08 PM  Result Value Ref Range   Troponin I 0.53 (HH) <0.03 ng/mL    Comment: CRITICAL VALUE NOTED.  VALUE IS CONSISTENT WITH PREVIOUSLY REPORTED AND CALLED VALUE.  Creatinine, urine, random     Status: None   Collection Time: 03/01/17  1:00 AM  Result Value Ref Range   Creatinine, Urine 79.74 mg/dL    Comment: Performed at [facility] [address] [place], [place] [address]  Sodium, urine, random     Status: None   Collection Time: 03/01/17  1:00 AM  Result Value Ref Range   Sodium, Ur 86 mmol/L    Comment: Performed at [facility] [address], [place], [place] [address]  Urinalysis, Routine w reflex microscopic     Status: Abnormal   Collection Time: 03/01/17  1:00 AM  Result Value Ref Range   Color, Urine YELLOW YELLOW   APPearance CLEAR CLEAR   Specific Gravity, Urine 1.011 1.005 - 1.030   pH 5.0 5.0 - 8.0   Glucose, UA NEGATIVE NEGATIVE mg/dL   Hgb urine dipstick NEGATIVE NEGATIVE   Bilirubin Urine NEGATIVE NEGATIVE   Ketones, ur NEGATIVE NEGATIVE mg/dL   Protein, ur 100 (A) NEGATIVE mg/dL   Nitrite NEGATIVE NEGATIVE   Leukocytes, UA NEGATIVE NEGATIVE   RBC / HPF 0-5 0 - 5 RBC/hpf   WBC, UA 0-5 0 - 5 WBC/hpf   Bacteria, UA RARE (A) NONE SEEN   Squamous Epithelial / LPF 0-5 (A) NONE SEEN   Hyaline Casts, UA PRESENT   Troponin I (q 6hr x 3)     Status:  Abnormal   Collection Time: 03/01/17  6:27 AM  Result Value Ref Range   Troponin I 0.52 (HH) <0.03 ng/mL    Comment: CRITICAL VALUE NOTED.  VALUE IS CONSISTENT WITH PREVIOUSLY REPORTED AND CALLED VALUE.  Renal function panel     Status: Abnormal   Collection Time: 03/01/17  6:27 AM  Result Value Ref Range   Sodium 140 135 - 145 mmol/L   Potassium 3.3 (L) 3.5 - 5.1 mmol/L   Chloride 102 101 - 111 mmol/L   CO2 27 22 - 32 mmol/L   Glucose, Bld 239 (H) 65 - 99 mg/dL   BUN 63 (H) 6 - 20 mg/dL   Creatinine, Ser 2.57 (H) 0.44 - 1.00 mg/dL   Calcium 8.5 (L) 8.9 - 10.3 mg/dL  Phosphorus 4.0 2.5 - 4.6 mg/dL   Albumin 3.1 (L) 3.5 - 5.0 g/dL   GFR calc non Af Amer 16 (L) >60 mL/min   GFR calc Af Amer 19 (L) >60 mL/min    Comment: (NOTE) The eGFR has been calculated using the CKD EPI equation. This calculation has not been validated in all clinical situations. eGFR's persistently <60 mL/min signify possible Chronic Kidney Disease.    Anion gap 11 5 - 15  Magnesium     Status: None   Collection Time: 03/01/17  6:27 AM  Result Value Ref Range   Magnesium 1.8 1.7 - 2.4 mg/dL  Iron and TIBC     Status: Abnormal   Collection Time: 03/01/17  6:27 AM  Result Value Ref Range   Iron 26 (L) 28 - 170 ug/dL   TIBC 203 (L) 250 - 450 ug/dL   Saturation Ratios 13 10.4 - 31.8 %   UIBC 177 ug/dL    Comment: Performed at [facility], [place] [address], [place], [place] [address]  Ferritin     Status: Abnormal   Collection Time: 03/01/17  6:27 AM  Result Value Ref Range   Ferritin 317 (H) 11 - 307 ng/mL    Comment: Performed at [facility], [place] [address], [place], [place] [address]  Basic metabolic panel     Status: Abnormal   Collection Time: 03/01/17  6:27 AM  Result Value Ref Range   Sodium 140 135 - 145 mmol/L   Potassium 3.2 (L) 3.5 - 5.1 mmol/L   Chloride 101 101 - 111 mmol/L   CO2 27 22 - 32 mmol/L   Glucose, Bld 239 (H) 65 - 99 mg/dL   BUN 63 (H) 6 - 20 mg/dL   Creatinine,  Ser 2.52 (H) 0.44 - 1.00 mg/dL   Calcium 8.4 (L) 8.9 - 10.3 mg/dL   GFR calc non Af Amer 17 (L) >60 mL/min   GFR calc Af Amer 19 (L) >60 mL/min    Comment: (NOTE) The eGFR has been calculated using the CKD EPI equation. This calculation has not been validated in all clinical situations. eGFR's persistently <60 mL/min signify possible Chronic Kidney Disease.    Anion gap 12 5 - 15  Hemoglobin A1c     Status: Abnormal   Collection Time: 03/01/17  6:27 AM  Result Value Ref Range   Hgb A1c MFr Bld 9.6 (H) 4.8 - 5.6 %    Comment: (NOTE) Pre diabetes:          5.7%-6.4% Diabetes:              >6.4% Glycemic control for   <7.0% adults with diabetes    Mean Plasma Glucose 228.82 mg/dL    Comment: Performed at [facility] [address] [place], [place] [address]  Glucose, capillary     Status: Abnormal   Collection Time: 03/01/17  7:11 AM  Result Value Ref Range   Glucose-Capillary 246 (H) 65 - 99 mg/dL  Glucose, capillary     Status: Abnormal   Collection Time: 03/01/17  7:48 AM  Result Value Ref Range   Glucose-Capillary 249 (H) 65 - 99 mg/dL  Glucose, capillary     Status: Abnormal   Collection Time: 03/01/17 11:57 AM  Result Value Ref Range   Glucose-Capillary 224 (H) 65 - 99 mg/dL   Comment 1 Notify RN    Dg Chest Port 1 [name]  Result Date: 02/28/2017 CLINICAL DATA:  Shortness  of breath.  Coronary artery disease. EXAM: PORTABLE CHEST 1 [name] COMPARISON:  01/02/2017. FINDINGS: The heart is enlarged. There is calcification of the thoracic aorta. Low lung volumes. BILATERAL pulmonary opacities could represent edema or pneumonia. Similar appearance to priors except for slightly improved aeration. IMPRESSION: Cardiomegaly with BILATERAL pulmonary opacities representing either edema or pneumonia. Similar appearance priors. Electronically Signed   By: Staci Righter M.D.   On: 02/28/2017 11:42    Review Of Systems Constitutional: No fever, chills, weight loss or gain. Eyes:  Positive vision change, wears glasses. No discharge or pain. Ears: No hearing loss, No tinnitus. Respiratory: No asthma, COPD, pneumonias. Positive shortness of breath. No hemoptysis. Cardiovascular: Positive chest pain, palpitation, leg edema. Gastrointestinal: No nausea, vomiting, diarrhea, constipation. No GI bleed. No hepatitis. Genitourinary: No dysuria, hematuria, kidney stone. No incontinance. Positive renal failure. Neurological: No headache, stroke, seizures.  Psychiatry: No psych facility admission for anxiety, depression, suicide. No detox. Skin: No rash. Musculoskeletal: Positive joint pain, no fibromyalgia. No neck pain, back pain. Lymphadenopathy: No lymphadenopathy. Hematology: No anemia or easy bruising.   Blood pressure (!) 188/41, pulse 67, temperature 99.6 F (37.6 C), temperature source Oral, resp. rate (!) 22, height _0  (1.676 m), weight 78.9 kg (173 lb 15.1 oz), SpO2 96 %. Body mass index is 28.08 kg/m. General appearance: alert, cooperative, appears stated age and no distress. Head: Normocephalic, atraumatic. Eyes: Brown eyes, wears glasses, Pale pink conjunctiva, corneas clear. PERRL, EOM's intact. Neck: No adenopathy, no carotid bruit, + JVD, supple, symmetrical, trachea midline and thyroid not enlarged. Resp: Clearing to auscultation bilaterally. Cardio: Regular rate and rhythm, S1, S2 normal, II/VI systolic murmur, no click, rub or gallop GI: Soft, non-tender; bowel sounds normal; no organomegaly. Extremities: 1 + edema, no cyanosis or clubbing. Skin: Warm and dry.  Neurologic: Alert and oriented X 3, normal strength. Right upper extremity weakness.  Assessment/Plan Acute diastolic heart failure Abnormal Troponin-I from demand ischemia Hypertension Hypokalemia DM, II, uncontrolled Acute on chronic kidney injury S/P Stroke Iron deficiency anemia  Continue diuresis. Check magnesium level. Add allopurinol-smaller dose for potential hyperuricemia with  renal injury and diuretic use. Will follow.  Birdie Riddle, MD  03/01/2017, 3:06 PM

## 2017-03-01 NOTE — Progress Notes (Signed)
Patient ID: Glenda [name], female   DOB: April 10, 1934, 83 y.o.   MRN: [id number]  [facility] Progress Note   Assessment/ Plan:   1. Acute hypoxic respiratory failure:  appears to be most likely from CHF exacerbation with element of bronchospasm-without significant urine output overnight but surprising clinical improvement and able to come off of BiPAP. Continue aggressive diuretic therapy. 2. Acute kidney injury on chronic kidney disease stage IV: Available data pointing to hemodynamically mediated acute kidney injury in the face of acute CHF exacerbation with renal hypoperfusion. Continue efforts at optimization of diuretic dosing-will add scheduled twice a day metolazone and continue to monitor. Her albumin level of 3.1 may favor transition to once a day torsemide in the future (improve compliance/ unique pharmacokinetics). 3. Hypertension: Most likely secondary to volume overload-monitor with ongoing diuretic therapy and resumed antihypertensive medications.. 4. Anemia of chronic kidney disease:  iron saturations are significantly low-will give intravenous iron. 5. Diabetes mellitus: Management per hospitalist service.  Subjective:   Improvement of shortness of breath overnight and now transitioned over from BiPAP to option via nasal cannula. She is net negative but appears to be improving further with augmentation of furosemide with metolazone.    Objective:   BP (!) 188/41   Pulse 67   Temp 99.6 F (37.6 C) (Oral)   Resp (!) 22   Ht 5\' 6"  (1.676 m)   Wt 78.9 kg (173 lb 15.1 oz)   SpO2 96%   BMI 28.08 kg/m   Intake/Output Summary (Last 24 hours) at 03/01/2017 1411 Last data filed at 03/01/2017 1100 Gross per 24 hour  Intake 376.05 ml  Output 850 ml  Net -473.95 ml   Weight change:   Physical Exam: Gen: Comfortably resting in bed, daughter at bedside CVS: Pulse normal rate, regular rhythm, S1 and S2 normal Resp: Fine rales bilaterally over lung bases Abd: Soft,  flat, nontender Ext: 2+-3+ pitting edema of the lower extremities  Imaging: Dg Chest Port 1 View  Result Date: 02/28/2017 CLINICAL DATA:  Shortness of breath.  Coronary artery disease. EXAM: PORTABLE CHEST 1 VIEW COMPARISON:  01/02/2017. FINDINGS: The heart is enlarged. There is calcification of the thoracic aorta. Low lung volumes. BILATERAL pulmonary opacities could represent edema or pneumonia. Similar appearance to priors except for slightly improved aeration. IMPRESSION: Cardiomegaly with BILATERAL pulmonary opacities representing either edema or pneumonia. Similar appearance priors. Electronically Signed   By: Staci Righter M.D.   On: 02/28/2017 11:42    Labs: BMET Recent Labs  Lab 02/28/17 1054 03/01/17 0627  NA 139 140  140  K 3.6 3.3*  3.2*  CL 100* 102  101  CO2 25 27  27   GLUCOSE 302* 239*  239*  BUN 62* 63*  63*  CREATININE 2.81* 2.57*  2.52*  CALCIUM 8.5* 8.5*  8.4*  PHOS  --  4.0   CBC Recent Labs  Lab 02/28/17 1054  WBC 8.0  HGB 9.4*  HCT 29.9*  MCV 87.4  PLT 185   Medications:    . aspirin  81 mg Oral Daily  . carvedilol  12.5 mg Oral BID WC  . clopidogrel  75 mg Oral Q breakfast  . heparin  5,000 Units Subcutaneous Q8H  . insulin aspart  0-20 Units Subcutaneous TID WC  . insulin detemir  12 Units Subcutaneous QHS  . isosorbide-hydrALAZINE  2 tablet Oral TID  . potassium chloride  40 mEq Oral Daily  . pravastatin  80 mg Oral Daily   Ulice Dash  Posey Pronto, MD 03/01/2017, 2:11 PM

## 2017-03-01 NOTE — Care Management Note (Signed)
Case Management Note  Patient Details  Name: Glenda [name] MRN: [id number] Date of Birth: 07-26-1933  Subjective/Objective:                   pna and hypertensive crisis requiring iv ntg and iv lasix  Action/Plan: Date: March 01, 2017 Creola Corn  [phone] Chart and notes review for patient progress and needs. Will follow for case management and discharge needs. Next review date: [id number]  Expected Discharge Date:  (unknown)               Expected Discharge Plan:  Home/Self Care  In-House Referral:     Discharge planning Services  CM Consult  Post Acute Care Choice:    Choice offered to:     DME Arranged:    DME Agency:     HH Arranged:    HH Agency:     Status of Service:  In process, will continue to follow  If discussed at Long Length of Stay Meetings, dates discussed:    Additional Comments:  Leeroy Cha, RN 03/01/2017, 8:41 AM

## 2017-03-01 NOTE — Progress Notes (Signed)
PROGRESS NOTE    Glenda [name]  WER:[id number] DOB: 1933/07/08 DOA: 02/28/2017 PCP: Alroy Dust, L.Marlou Sa, MD   Brief Narrative: 83 year old female with history of hypertension, type 2 diabetes, multiple a stroke with residual right upper extremity weakness, history of breast cancer status post right lumpectomy, chronic kidney disease is stage IV follows up with the nephrologist outpatient, presented with worsening shortness of breath and nonproductive cough.. Patient required BiPAP in ER. Chest x-ray with cardiomegaly and pulmonary vascular congestion. He started on Lasix drip and nitro drip. Nephrology team is following.  Assessment & Plan:   # Acute respiratory failure with hypoxia in the setting of acute pulmonary edema/CHF: Patient is currently off BiPAP and requiring 4-5 L of oxygen. She reported that her shortness of breath is better. Continue diuretics. Try to wean down oxygen gradually. Repeat x-ray in the morning.  # Acute on chronic diastolic congestive heart failure: - Last echocardiogram on September 2018 with EF of 65-70% and grade 3 diastolic dysfunction. Patient with elevated troponin likely in the setting of CHF. Cardiology consult requested and d/w Dr. Doylene Canard. -Patient does not have chest pain. -Continue diuretics, strict in's and out and follow-up cardiology.  # Acute kidney injury on chronic kidney disease stage IV likely hemodynamically mediated in the setting of CHF/renal hypoperfusion. -Serum creatinine level mildly improving with diuretics. Patient does not have significant urine output. I discussed with the patient's nurse at bedside for bladder scan and a strict ins and outs. Currently on IV Lasix 160 mg 3 times a day. I added a dose of metolazone to increase diuresis. Daily weight and monitor the BMP.  # Hypertensive urgency: Resume by bidil, Coreg. Try to wean off nitroglycerin gradually. -Monitor blood pressure.  # Anemia of chronic kidney disease: Hemoglobin 9.4.  Follow-up iron studies. May need ESA. No sign of bleeding.  # Type 2 diabetes on insulin: Continue current insulin regimen. Monitor blood sugar level.  # Hypokalemia in the setting of diuretics: Started oral potassium every day. Monitor lab. Check magnesium level.  DVT prophylaxis: Heparin subcutaneous Code Status: Full code Family Communication: Discussed with the patient's daughter at bedside Disposition Plan: Current in the stepdown    Consultants:   Nephrology  Cardiology  Procedures: BiPAP on admission Antimicrobials: None  Subjective: Seen and examined at bedside. Shortness of breath is better. Has mild cough. Denied headache, dizziness, nausea vomiting. Patient's daughter at bedside  Objective: Vitals:   03/01/17 0700 03/01/17 0800 03/01/17 0832 03/01/17 0900  BP: (!) 189/41 (!) 184/53  (!) 182/50  Pulse: 72 72  72  Resp: (!) 24 (!) 23  (!) 21  Temp:   99.1 F (37.3 C)   TempSrc:   Oral   SpO2: 98% 96%  98%  Weight:      Height:        Intake/Output Summary (Last 24 hours) at 03/01/2017 1019 Last data filed at 03/01/2017 0800 Gross per 24 hour  Intake 376.05 ml  Output 600 ml  Net -223.95 ml   Filed Weights   02/28/17 0959 02/28/17 1018 02/28/17 2044  Weight: 78.5 kg (173 lb) 78.5 kg (173 lb) 78.9 kg (173 lb 15.1 oz)    Examination:  General exam: Appears calm and comfortable  Respiratory system: Bibasal crackles, no wheezing, respiratory effort normal Cardiovascular system: S1 & S2 heard, RRR.  Trace pedal edema. Gastrointestinal system: Abdomen is nondistended, soft and nontender. Normal bowel sounds heard. Central nervous system: Alert and oriented. No focal neurological deficits. Extremities: Symmetric 5 x  5 power. Skin: No rashes, lesions or ulcers Psychiatry: Judgement and insight appear normal. Mood & affect appropriate.     Data Reviewed: I have personally reviewed following labs and imaging studies  CBC: Recent Labs  Lab 02/28/17 1054   WBC 8.0  HGB 9.4*  HCT 29.9*  MCV 87.4  PLT 201   Basic Metabolic Panel: Recent Labs  Lab 02/28/17 1054 03/01/17 0627  NA 139 140  140  K 3.6 3.3*  3.2*  CL 100* 102  101  CO2 25 27  27   GLUCOSE 302* 239*  239*  BUN 62* 63*  63*  CREATININE 2.81* 2.57*  2.52*  CALCIUM 8.5* 8.5*  8.4*  MG  --  1.8  PHOS  --  4.0   GFR: Estimated Creatinine Clearance: 18.2 mL/min (A) (by C-G formula based on SCr of 2.57 mg/dL (H)). Liver Function Tests: Recent Labs  Lab 02/28/17 1054 03/01/17 0627  AST 20  --   ALT 13*  --   ALKPHOS 116  --   BILITOT 0.6  --   PROT 7.4  --   ALBUMIN 3.4* 3.1*   No results for input(s): LIPASE, AMYLASE in the last 168 hours. No results for input(s): AMMONIA in the last 168 hours. Coagulation Profile: No results for input(s): INR, PROTIME in the last 168 hours. Cardiac Enzymes: Recent Labs  Lab 02/28/17 1054 02/28/17 1706 02/28/17 2308 03/01/17 0627  TROPONINI <0.03 0.37* 0.53* 0.52*   BNP (last 3 results) No results for input(s): PROBNP in the last 8760 hours. HbA1C: Recent Labs    03/01/17 0627  HGBA1C 9.6*   CBG: Recent Labs  Lab 02/28/17 2128 03/01/17 0711 03/01/17 0748  GLUCAP 276* 246* 249*   Lipid Profile: No results for input(s): CHOL, HDL, LDLCALC, TRIG, CHOLHDL, LDLDIRECT in the last 72 hours. Thyroid Function Tests: No results for input(s): TSH, T4TOTAL, FREET4, T3FREE, THYROIDAB in the last 72 hours. Anemia Panel: No results for input(s): VITAMINB12, FOLATE, FERRITIN, TIBC, IRON, RETICCTPCT in the last 72 hours. Sepsis Labs: Recent Labs  Lab 02/28/17 1058  LATICACIDVEN 0.88    Recent Results (from the past 240 hour(s))  MRSA PCR Screening     Status: None   Collection Time: 02/28/17  9:39 PM  Result Value Ref Range Status   MRSA by PCR NEGATIVE NEGATIVE Final    Comment:        The GeneXpert MRSA Assay (FDA approved for NASAL specimens only), is one component of a comprehensive MRSA  colonization surveillance program. It is not intended to diagnose MRSA infection nor to guide or monitor treatment for MRSA infections.          Radiology Studies: Dg Chest Port 1 View  Result Date: 02/28/2017 CLINICAL DATA:  Shortness of breath.  Coronary artery disease. EXAM: PORTABLE CHEST 1 VIEW COMPARISON:  01/02/2017. FINDINGS: The heart is enlarged. There is calcification of the thoracic aorta. Low lung volumes. BILATERAL pulmonary opacities could represent edema or pneumonia. Similar appearance to priors except for slightly improved aeration. IMPRESSION: Cardiomegaly with BILATERAL pulmonary opacities representing either edema or pneumonia. Similar appearance priors. Electronically Signed   By: Staci Righter M.D.   On: 02/28/2017 11:42        Scheduled Meds: . aspirin  81 mg Oral Daily  . carvedilol  12.5 mg Oral BID WC  . clopidogrel  75 mg Oral Q breakfast  . heparin  5,000 Units Subcutaneous Q8H  . insulin aspart  0-20 Units Subcutaneous TID WC  .  insulin detemir  12 Units Subcutaneous QHS  . isosorbide-hydrALAZINE  2 tablet Oral TID  . metolazone  5 mg Oral Once  . potassium chloride  40 mEq Oral Daily  . pravastatin  80 mg Oral Daily   Continuous Infusions: . furosemide Stopped (03/01/17 0729)  . nitroGLYCERIN 100 mcg/min (03/01/17 0610)     LOS: 1 day    Kaylah Chiasson Tanna Furry, MD [facility] Pager [phone]  If 7PM-7AM, please contact night-coverage [email or web address] Password [email or web address] 03/01/2017, 10:19 AM

## 2017-03-01 NOTE — Progress Notes (Signed)
BLE and LUE venous duplex prelim: negative for DVT. Landry Mellow, RDMS, RVT

## 2017-03-02 ENCOUNTER — Inpatient Hospital Stay (HOSPITAL_COMMUNITY): Payer: Medicare (Managed Care)

## 2017-03-02 DIAGNOSIS — N183 Chronic kidney disease, stage 3 (moderate): Secondary | ICD-10-CM

## 2017-03-02 LAB — GLUCOSE, CAPILLARY
GLUCOSE-CAPILLARY: 194 mg/dL — AB (ref 65–99)
GLUCOSE-CAPILLARY: 157 mg/dL — AB (ref 65–99)
GLUCOSE-CAPILLARY: 138 mg/dL — AB (ref 65–99)
Glucose-Capillary: 200 mg/dL — ABNORMAL HIGH (ref 65–99)

## 2017-03-02 LAB — CBC
HEMATOCRIT: 27 % — AB (ref 36.0–46.0)
HEMOGLOBIN: 8.6 g/dL — AB (ref 12.0–15.0)
MCH: 27.8 pg (ref 26.0–34.0)
MCHC: 31.9 g/dL (ref 30.0–36.0)
MCV: 87.4 fL (ref 78.0–100.0)
Platelets: 164 10*3/uL (ref 150–400)
RBC: 3.09 MIL/uL — ABNORMAL LOW (ref 3.87–5.11)
RDW: 14.4 % (ref 11.5–15.5)
WBC: 5.5 10*3/uL (ref 4.0–10.5)

## 2017-03-02 LAB — RENAL FUNCTION PANEL
ANION GAP: 10 (ref 5–15)
Albumin: 3 g/dL — ABNORMAL LOW (ref 3.5–5.0)
BUN: 61 mg/dL — ABNORMAL HIGH (ref 6–20)
CHLORIDE: 101 mmol/L (ref 101–111)
CO2: 28 mmol/L (ref 22–32)
Calcium: 8.6 mg/dL — ABNORMAL LOW (ref 8.9–10.3)
Creatinine, Ser: 2.49 mg/dL — ABNORMAL HIGH (ref 0.44–1.00)
GFR calc Af Amer: 20 mL/min — ABNORMAL LOW (ref >60)
GFR calc non Af Amer: 17 mL/min — ABNORMAL LOW (ref >60)
GLUCOSE: 194 mg/dL — AB (ref 65–99)
POTASSIUM: 3.6 mmol/L (ref 3.5–5.1)
Phosphorus: 3.5 mg/dL (ref 2.5–4.6)
Sodium: 139 mmol/L (ref 135–145)

## 2017-03-02 LAB — MAGNESIUM: Magnesium: 1.7 mg/dL (ref 1.7–2.4)

## 2017-03-02 LAB — UREA NITROGEN, URINE: Urea Nitrogen, Ur: 384 mg/dL

## 2017-03-02 MED ORDER — AMLODIPINE BESYLATE 10 MG PO TABS
10.0000 mg | ORAL_TABLET | Freq: Every day | ORAL | Status: DC
  Administered 2017-03-02 – 2017-03-06 (×5): 10 mg via ORAL
  Filled 2017-03-02 (×5): qty 1

## 2017-03-02 MED ORDER — CLONIDINE HCL 0.2 MG PO TABS
0.2000 mg | ORAL_TABLET | Freq: Two times a day (BID) | ORAL | Status: DC
  Administered 2017-03-02 – 2017-03-06 (×9): 0.2 mg via ORAL
  Filled 2017-03-02: qty 2
  Filled 2017-03-02: qty 1
  Filled 2017-03-02: qty 2
  Filled 2017-03-02: qty 1
  Filled 2017-03-02 (×2): qty 2
  Filled 2017-03-02 (×2): qty 1
  Filled 2017-03-02: qty 2

## 2017-03-02 MED ORDER — MAGNESIUM SULFATE 2 GM/50ML IV SOLN
2.0000 g | Freq: Once | INTRAVENOUS | Status: AC
  Administered 2017-03-02: 2 g via INTRAVENOUS
  Filled 2017-03-02: qty 50

## 2017-03-02 NOTE — Progress Notes (Signed)
PROGRESS NOTE    Glenda [name]  IRJ:[id number] DOB: 1933-10-23 DOA: 02/28/2017 PCP: Patient, No Pcp Per   Brief Narrative: 83 year old female with history of hypertension, type 2 diabetes, multiple a stroke with residual right upper extremity weakness, history of breast cancer status post right lumpectomy, chronic kidney disease is stage IV follows up with the nephrologist outpatient, presented with worsening shortness of breath and nonproductive cough.. Patient required BiPAP in ER. Chest x-ray with cardiomegaly and pulmonary vascular congestion. Started on Lasix drip and nitro drip. Nephrology team is following.  Assessment & Plan:   # Acute respiratory failure with hypoxia in the setting of acute pulmonary edema/CHF: Patient is currently off BiPAP and requiring about 3 L of oxygen. Patient reported that her shortness of breath is improving.  Continue diuretics. Try to wean down oxygen gradually. Repeat x-ray today unchanged consistent with CHF. I do not think patient has pneumonia.  # Acute on chronic diastolic congestive heart failure: - Last echocardiogram on September 2018 with EF of 65-70% and grade 3 diastolic dysfunction. Patient with elevated troponin likely in the setting of CHF. Cardiology consult appreciated. Continue diuretics. -Patient does not have chest pain. -Continue diuretics, strict in's and out and follow-up cardiology.  # Acute kidney injury on chronic kidney disease stage IV likely hemodynamically mediated in the setting of CHF/renal hypoperfusion. -Serum creatinine level stable. Patient with no good response with high dose of IV Lasix and metolazone. Bladder scan was done consistent with 400 mL post void residual. Plan to insert Foley catheter. Monitor urine output. Monitor BMP and electrolytes. Nephrology consult appreciated.  # Hypertensive urgency: Blood pressure is not controlled likely withdrawal from clonidine. Resume home dose of oral clonidine, Norvasc.  Continue BiDil, Coreg and diuretics. Monitor blood pressure closely. Avoid sudden drop in blood pressure. Stopped nitroglycerin.   # Anemia of chronic kidney disease/iron deficiency anemia: Received a dose of IV iron. Monitor CBC. No sign of bleeding.  # Type 2 diabetes on insulin: Continue current insulin regimen. Monitor blood sugar level.  # Hypokalemia in the setting of diuretics: Continue oral potassium every day.  -Ordered a dose of magnesium sulfate for borderline low hypomagnesemia. Replete.  PT/OT evaluation.  DVT prophylaxis: Heparin subcutaneous Code Status: Full code Family Communication: Discussed with the patient's daughter at bedside Disposition Plan: Current in the stepdown    Consultants:   Nephrology  Cardiology  Procedures: BiPAP on admission Antimicrobials: None  Subjective: Seen and examined at bedside. Shortness of breath is improving. Denied headache, dizziness, nausea vomiting chest shortness of breath.  Objective: Vitals:   03/02/17 0330 03/02/17 0500 03/02/17 0800 03/02/17 0836  BP:  (!) 139/47  (!) 208/65  Pulse:  73  75  Resp:  20    Temp: 98.3 F (36.8 C)  98.7 F (37.1 C)   TempSrc: Oral  Axillary   SpO2:  97%    Weight:      Height:        Intake/Output Summary (Last 24 hours) at 03/02/2017 1108 Last data filed at 03/02/2017 0927 Gross per 24 hour  Intake 496.5 ml  Output 1135 ml  Net -638.5 ml   Filed Weights   02/28/17 0959 02/28/17 1018 02/28/17 2044  Weight: 78.5 kg (173 lb) 78.5 kg (173 lb) 78.9 kg (173 lb 15.1 oz)    Examination:  General exam: Sitting on bed Respiratory system: Bibasal crackles, respiratory effort normal, no wheezing Cardiovascular system: Regular rate rhythm, S1-S2 normal. No pedal edema. Gastrointestinal system: Abdomen is  soft, nontender, nondistended. Bowel sound positive. Central nervous system: Alert and awake,  following commands Extremities: Symmetric 5 x 5 power. Skin: No rashes, lesions or  ulcers Psychiatry: Judgement and insight appear normal. Mood & affect appropriate.     Data Reviewed: I have personally reviewed following labs and imaging studies  CBC: Recent Labs  Lab 02/28/17 1054 03/02/17 0350  WBC 8.0 5.5  HGB 9.4* 8.6*  HCT 29.9* 27.0*  MCV 87.4 87.4  PLT 185 016   Basic Metabolic Panel: Recent Labs  Lab 02/28/17 1054 03/01/17 0627 03/02/17 0350  NA 139 140  140 139  K 3.6 3.3*  3.2* 3.6  CL 100* 102  101 101  CO2 25 27  27 28   GLUCOSE 302* 239*  239* 194*  BUN 62* 63*  63* 61*  CREATININE 2.81* 2.57*  2.52* 2.49*  CALCIUM 8.5* 8.5*  8.4* 8.6*  MG  --  1.8 1.7  PHOS  --  4.0 3.5   GFR: Estimated Creatinine Clearance: 18.8 mL/min (A) (by C-G formula based on SCr of 2.49 mg/dL (H)). Liver Function Tests: Recent Labs  Lab 02/28/17 1054 03/01/17 0627 03/02/17 0350  AST 20  --   --   ALT 13*  --   --   ALKPHOS 116  --   --   BILITOT 0.6  --   --   PROT 7.4  --   --   ALBUMIN 3.4* 3.1* 3.0*   No results for input(s): LIPASE, AMYLASE in the last 168 hours. No results for input(s): AMMONIA in the last 168 hours. Coagulation Profile: No results for input(s): INR, PROTIME in the last 168 hours. Cardiac Enzymes: Recent Labs  Lab 02/28/17 1054 02/28/17 1706 02/28/17 2308 03/01/17 0627  TROPONINI <0.03 0.37* 0.53* 0.52*   BNP (last 3 results) No results for input(s): PROBNP in the last 8760 hours. HbA1C: Recent Labs    03/01/17 0627  HGBA1C 9.6*   CBG: Recent Labs  Lab 03/01/17 0748 03/01/17 1157 03/01/17 1644 03/01/17 2113 03/02/17 0759  GLUCAP 249* 224* 140* 200* 200*   Lipid Profile: No results for input(s): CHOL, HDL, LDLCALC, TRIG, CHOLHDL, LDLDIRECT in the last 72 hours. Thyroid Function Tests: No results for input(s): TSH, T4TOTAL, FREET4, T3FREE, THYROIDAB in the last 72 hours. Anemia Panel: Recent Labs    03/01/17 0627  FERRITIN 317*  TIBC 203*  IRON 26*   Sepsis Labs: Recent Labs  Lab  02/28/17 1058  LATICACIDVEN 0.88    Recent Results (from the past 240 hour(s))  MRSA PCR Screening     Status: None   Collection Time: 02/28/17  9:39 PM  Result Value Ref Range Status   MRSA by PCR NEGATIVE NEGATIVE Final    Comment:        The GeneXpert MRSA Assay (FDA approved for NASAL specimens only), is one component of a comprehensive MRSA colonization surveillance program. It is not intended to diagnose MRSA infection nor to guide or monitor treatment for MRSA infections.          Radiology Studies: Dg Chest Port 1 View  Result Date: 03/02/2017 CLINICAL DATA:  Shortness of Breath EXAM: PORTABLE CHEST 1 VIEW COMPARISON:  February 28, 2017 FINDINGS: There is persistent cardiomegaly with mild pulmonary venous hypertension. There is airspace consolidation throughout the left lower lobe with more patchy infiltrate in the right lower lobe. There is aortic atherosclerosis. No evident adenopathy. No bone lesions. IMPRESSION: Persistent pulmonary vascular congestion. Airspace opacity bilaterally, consistent with either  pneumonia or pulmonary edema. Both entities may exist concurrently. There is somewhat more consolidation in the left lower lobe and right lower lobe. The appearance overall is similar compared to 1 day prior. There is aortic atherosclerosis. Aortic Atherosclerosis (ICD10-I70.0). Electronically Signed   By: Lowella Grip III M.D.   On: 03/02/2017 07:20   Dg Chest Port 1 View  Result Date: 02/28/2017 CLINICAL DATA:  Shortness of breath.  Coronary artery disease. EXAM: PORTABLE CHEST 1 VIEW COMPARISON:  01/02/2017. FINDINGS: The heart is enlarged. There is calcification of the thoracic aorta. Low lung volumes. BILATERAL pulmonary opacities could represent edema or pneumonia. Similar appearance to priors except for slightly improved aeration. IMPRESSION: Cardiomegaly with BILATERAL pulmonary opacities representing either edema or pneumonia. Similar appearance priors.  Electronically Signed   By: Staci Righter M.D.   On: 02/28/2017 11:42        Scheduled Meds: . allopurinol  100 mg Oral Daily  . amLODipine  10 mg Oral Daily  . aspirin  81 mg Oral Daily  . carvedilol  12.5 mg Oral BID WC  . cloNIDine  0.2 mg Oral BID  . clopidogrel  75 mg Oral Q breakfast  . heparin  5,000 Units Subcutaneous Q8H  . insulin aspart  0-20 Units Subcutaneous TID WC  . insulin detemir  12 Units Subcutaneous QHS  . isosorbide-hydrALAZINE  2 tablet Oral TID  . metolazone  5 mg Oral BID  . potassium chloride  40 mEq Oral Daily  . pravastatin  80 mg Oral Daily   Continuous Infusions: . furosemide Stopped (03/02/17 0810)     LOS: 2 days    Dron Tanna Furry, MD [facility] Pager [phone]  If 7PM-7AM, please contact night-coverage [email or web address] Password [email or web address] 03/02/2017, 11:08 AM

## 2017-03-02 NOTE — Consult Note (Signed)
Ref: Patient, No Pcp Per   Subjective:  Currently resting comfortably with improved urine output. VS stable.   Objective:  Vital Signs in the last 24 hours: Temp:  [97.7 F (36.5 C)-99 F (37.2 C)] 98.6 F (37 C) (11/07 1600) Pulse Rate:  [63-83] 64 (11/07 1400) Cardiac Rhythm: Normal sinus rhythm (11/07 1028) Resp:  [13-25] 16 (11/07 1400) BP: (139-208)/(44-87) 167/44 (11/07 1400) SpO2:  [96 %-100 %] 100 % (11/07 1400)  Physical Exam: BP Readings from Last 1 Encounters:  03/02/17 (!) 167/44     Wt Readings from Last 1 Encounters:  02/28/17 78.9 kg (173 lb 15.1 oz)    Weight change:  Body mass index is 28.08 kg/m. HEENT: [place]/AT, Eyes-Brown, PERL, EOMI, Conjunctiva-Pink, Sclera-Non-icteric Neck: No JVD, No bruit, Trachea midline. Lungs:  Clear, Bilateral. Cardiac:  Regular rhythm, normal S1 and S2, no S3. II/VI systolic murmur. Abdomen:  Soft, non-tender. BS present. Extremities:  Trace edema present. No cyanosis. No clubbing. CNS: AxOx3, Cranial nerves grossly intact, moves all 4 extremities.  Skin: Warm and dry.   Intake/Output from previous day: 11/06 0701 - 11/07 0700 In: 551 [I.V.:302; IV Piggyback:249] Out: 825 [Urine:825]    Lab Results: BMET    Component Value Date/Time   NA 139 03/02/2017 0350   NA 140 03/01/2017 0627   NA 140 03/01/2017 0627   NA 140 09/16/2015   NA 136 (A) 09/04/2015   NA 141 05/07/2015 1251   K 3.6 03/02/2017 0350   K 3.3 (L) 03/01/2017 0627   K 3.2 (L) 03/01/2017 0627   K 4.3 05/07/2015 1251   CL 101 03/02/2017 0350   CL 102 03/01/2017 0627   CL 101 03/01/2017 0627   CO2 28 03/02/2017 0350   CO2 27 03/01/2017 0627   CO2 27 03/01/2017 0627   CO2 22 05/07/2015 1251   GLUCOSE 194 (H) 03/02/2017 0350   GLUCOSE 239 (H) 03/01/2017 0627   GLUCOSE 239 (H) 03/01/2017 0627   GLUCOSE 110 05/07/2015 1251   BUN 61 (H) 03/02/2017 0350   BUN 63 (H) 03/01/2017 0627   BUN 63 (H) 03/01/2017 0627   BUN 34 (A) 09/16/2015   BUN 73 (A)  09/04/2015   BUN 22.5 05/07/2015 1251   CREATININE 2.49 (H) 03/02/2017 0350   CREATININE 2.57 (H) 03/01/2017 0627   CREATININE 2.52 (H) 03/01/2017 0627   CREATININE 1.2 (H) 05/07/2015 1251   CALCIUM 8.6 (L) 03/02/2017 0350   CALCIUM 8.5 (L) 03/01/2017 0627   CALCIUM 8.4 (L) 03/01/2017 0627   CALCIUM 9.1 05/07/2015 1251   GFRNONAA 17 (L) 03/02/2017 0350   GFRNONAA 16 (L) 03/01/2017 0627   GFRNONAA 17 (L) 03/01/2017 0627   GFRAA 20 (L) 03/02/2017 0350   GFRAA 19 (L) 03/01/2017 0627   GFRAA 19 (L) 03/01/2017 0627   CBC    Component Value Date/Time   WBC 5.5 03/02/2017 0350   RBC 3.09 (L) 03/02/2017 0350   HGB 8.6 (L) 03/02/2017 0350   HGB 11.0 (L) 05/07/2015 1251   HCT 27.0 (L) 03/02/2017 0350   HCT 34.0 (L) 05/07/2015 1251   PLT 164 03/02/2017 0350   PLT 207 05/07/2015 1251   MCV 87.4 03/02/2017 0350   MCV 84.0 05/07/2015 1251   MCH 27.8 03/02/2017 0350   MCHC 31.9 03/02/2017 0350   RDW 14.4 03/02/2017 0350   RDW 12.8 05/07/2015 1251   LYMPHSABS 0.9 01/02/2017 0916   LYMPHSABS 1.7 05/07/2015 1251   MONOABS 0.6 01/02/2017 0916   MONOABS 0.5  05/07/2015 1251   EOSABS 0.2 01/02/2017 0916   EOSABS 0.0 05/07/2015 1251   BASOSABS 0.0 01/02/2017 0916   BASOSABS 0.0 05/07/2015 1251   HEPATIC Function Panel Recent Labs    01/02/17 0916 02/28/17 1054  PROT 7.4 7.4   HEMOGLOBIN A1C No components found for: HGA1C,  MPG CARDIAC ENZYMES Lab Results  Component Value Date   CKTOTAL 487 (H) 03/07/2009   CKMB 4.2 (H) 03/07/2009   TROPONINI 0.52 (HH) 03/01/2017   TROPONINI 0.53 (HH) 02/28/2017   TROPONINI 0.37 (HH) 02/28/2017   BNP No results for input(s): PROBNP in the last 8760 hours. TSH Recent Labs    01/02/17 1535  TSH 1.145   CHOLESTEROL No results for input(s): CHOL in the last 8760 hours.  Scheduled Meds: . allopurinol  100 mg Oral Daily  . amLODipine  10 mg Oral Daily  . aspirin  81 mg Oral Daily  . carvedilol  12.5 mg Oral BID WC  . cloNIDine  0.2 mg  Oral BID  . clopidogrel  75 mg Oral Q breakfast  . heparin  5,000 Units Subcutaneous Q8H  . insulin aspart  0-20 Units Subcutaneous TID WC  . insulin detemir  12 Units Subcutaneous QHS  . isosorbide-hydrALAZINE  2 tablet Oral TID  . metolazone  5 mg Oral BID  . potassium chloride  40 mEq Oral Daily  . pravastatin  80 mg Oral Daily   Continuous Infusions: . furosemide 160 mg (03/02/17 1620)   PRN Meds:.acetaminophen  Assessment/Plan: Acute diastolic heart failure Abnormal Troponin-I from demand ischemia Hypertension Hypokalemia-corrected DM, II CKD, III S/P stroke Iron deficiency anemia  Continue medical treatment. Will follow. Discussed care with daughter.   LOS: 2 days    Dixie Dials  MD  03/02/2017, 5:03 PM

## 2017-03-02 NOTE — Progress Notes (Signed)
Patient ID: Glenda [name], female   DOB: 07-17-1933, 83 y.o.   MRN: [id number]  [facility] Progress Note   Assessment/ Plan:   1. Acute hypoxic respiratory failure:  appears to be most likely from CHF exacerbation with element of bronchospasm but no clear evidence of pneumonia-without significant urine output overnight but surprising clinical improvement and able to come off of BiPAP. Will continue furosemide/metolazone at current dose. 2. Acute kidney injury on chronic kidney disease stage IV: (Baseline creatinine ~2.3) likely hemodynamically mediated acute kidney injury in the face of acute CHF exacerbation with renal hypoperfusion. Renal function and volume status improving with ongoing diuretic therapy-plan to transition to oral diuretic therapy in 48 hours. Appreciated placement of indwelling Foley catheter. 3. Hypertension:  blood pressures elevated and expected to improve with volume unloading 4. Anemia of chronic kidney disease:  with significant iron deficiency--now s/p IV feraheme. 5. Diabetes mellitus: Management per hospitalist service.  Subjective:   Reports to be feeling better today-states that breathing is almost back to her baseline. Denies any chest pain.    Objective:   BP (!) 160/46   Pulse 63   Temp 97.7 F (36.5 C) (Oral)   Resp 20   Ht 5\' 6"  (1.676 m)   Wt 78.9 kg (173 lb 15.1 oz)   SpO2 99%   BMI 28.08 kg/m   Intake/Output Summary (Last 24 hours) at 03/02/2017 1342 Last data filed at 03/02/2017 0272 Gross per 24 hour  Intake 496.5 ml  Output 1135 ml  Net -638.5 ml   Weight change:   Physical Exam: Gen: Comfortably resting in bed, daughter at bedside CVS: Pulse normal rate, regular rhythm, S1 and S2 normal Resp: Anteriorly clear to auscultation, no distinct rales or rhonchi Abd: Soft, flat, nontender Ext: 2+ pitting edema of the lower extremities (improved from yesterday)  Imaging: Dg Chest Port 1 View  Result Date:  03/02/2017 CLINICAL DATA:  Shortness of Breath EXAM: PORTABLE CHEST 1 VIEW COMPARISON:  February 28, 2017 FINDINGS: There is persistent cardiomegaly with mild pulmonary venous hypertension. There is airspace consolidation throughout the left lower lobe with more patchy infiltrate in the right lower lobe. There is aortic atherosclerosis. No evident adenopathy. No bone lesions. IMPRESSION: Persistent pulmonary vascular congestion. Airspace opacity bilaterally, consistent with either pneumonia or pulmonary edema. Both entities may exist concurrently. There is somewhat more consolidation in the left lower lobe and right lower lobe. The appearance overall is similar compared to 1 day prior. There is aortic atherosclerosis. Aortic Atherosclerosis (ICD10-I70.0). Electronically Signed   By: Lowella Grip III M.D.   On: 03/02/2017 07:20    Labs: BMET Recent Labs  Lab 02/28/17 1054 03/01/17 0627 03/02/17 0350  NA 139 140  140 139  K 3.6 3.3*  3.2* 3.6  CL 100* 102  101 101  CO2 25 27  27 28   GLUCOSE 302* 239*  239* 194*  BUN 62* 63*  63* 61*  CREATININE 2.81* 2.57*  2.52* 2.49*  CALCIUM 8.5* 8.5*  8.4* 8.6*  PHOS  --  4.0 3.5   CBC Recent Labs  Lab 02/28/17 1054 03/02/17 0350  WBC 8.0 5.5  HGB 9.4* 8.6*  HCT 29.9* 27.0*  MCV 87.4 87.4  PLT 185 164   Medications:    . allopurinol  100 mg Oral Daily  . amLODipine  10 mg Oral Daily  . aspirin  81 mg Oral Daily  . carvedilol  12.5 mg Oral BID WC  . cloNIDine  0.2 mg  Oral BID  . clopidogrel  75 mg Oral Q breakfast  . heparin  5,000 Units Subcutaneous Q8H  . insulin aspart  0-20 Units Subcutaneous TID WC  . insulin detemir  12 Units Subcutaneous QHS  . isosorbide-hydrALAZINE  2 tablet Oral TID  . metolazone  5 mg Oral BID  . potassium chloride  40 mEq Oral Daily  . pravastatin  80 mg Oral Daily   Elmarie Shiley, MD 03/02/2017, 1:42 PM

## 2017-03-03 DIAGNOSIS — R0902 Hypoxemia: Secondary | ICD-10-CM

## 2017-03-03 LAB — RENAL FUNCTION PANEL
ALBUMIN: 2.8 g/dL — AB (ref 3.5–5.0)
ANION GAP: 9 (ref 5–15)
BUN: 61 mg/dL — ABNORMAL HIGH (ref 6–20)
CHLORIDE: 102 mmol/L (ref 101–111)
CO2: 29 mmol/L (ref 22–32)
Calcium: 8.7 mg/dL — ABNORMAL LOW (ref 8.9–10.3)
Creatinine, Ser: 2.49 mg/dL — ABNORMAL HIGH (ref 0.44–1.00)
GFR calc Af Amer: 20 mL/min — ABNORMAL LOW (ref >60)
GFR, EST NON AFRICAN AMERICAN: 17 mL/min — AB (ref >60)
Glucose, Bld: 109 mg/dL — ABNORMAL HIGH (ref 65–99)
PHOSPHORUS: 3.8 mg/dL (ref 2.5–4.6)
POTASSIUM: 3.8 mmol/L (ref 3.5–5.1)
Sodium: 140 mmol/L (ref 135–145)

## 2017-03-03 LAB — GLUCOSE, CAPILLARY
GLUCOSE-CAPILLARY: 114 mg/dL — AB (ref 65–99)
GLUCOSE-CAPILLARY: 176 mg/dL — AB (ref 65–99)
GLUCOSE-CAPILLARY: 107 mg/dL — AB (ref 65–99)
Glucose-Capillary: 251 mg/dL — ABNORMAL HIGH (ref 65–99)

## 2017-03-03 LAB — MAGNESIUM: MAGNESIUM: 2.3 mg/dL (ref 1.7–2.4)

## 2017-03-03 MED ORDER — SIMETHICONE 80 MG PO CHEW
80.0000 mg | CHEWABLE_TABLET | Freq: Four times a day (QID) | ORAL | Status: DC | PRN
  Administered 2017-03-03: 80 mg via ORAL
  Filled 2017-03-03: qty 1

## 2017-03-03 MED ORDER — FAMOTIDINE 20 MG PO TABS
20.0000 mg | ORAL_TABLET | Freq: Two times a day (BID) | ORAL | Status: DC
  Administered 2017-03-03 – 2017-03-04 (×2): 20 mg via ORAL
  Filled 2017-03-03 (×2): qty 1

## 2017-03-03 MED ORDER — ALUM & MAG HYDROXIDE-SIMETH 200-200-20 MG/5ML PO SUSP: 30.0000 mL | ORAL | Status: DC | PRN

## 2017-03-03 NOTE — Progress Notes (Signed)
Patient ID: Glenda [name], female   DOB: Jan 02, 1934, 83 y.o.   MRN: [id number]  [facility] Progress Note   Assessment/ Plan:   1. Acute hypoxic respiratory failure:  appears to be most likely from CHF exacerbation with element of bronchospasm but no clear evidence of pneumonia. She continues to improve well from a clinical standpoint with good diuresis overnight and symptomatic improvement. Physical exam indicated a good diuresis. 2. Acute kidney injury on chronic kidney disease stage IV: (Baseline creatinine ~2.3) likely hemodynamically mediated acute kidney injury in the face of acute CHF exacerbation with renal hypoperfusion. She continues to have excellent urine output on current high-dose diuretics and appears to be nearing her euvolemic state/dry weight-will ask nursing staff to check her weight today. Plan to transition over to oral diuretics tomorrow-would prefer twice a day torsemide 100mg  over furosemide 160mg . 3. Hypertension:  blood pressures elevated and expected to improve with volume unloading 4. Anemia of chronic kidney disease:  with significant iron deficiency--now s/p first dose of IV feraheme. 5. Diabetes mellitus: Management per hospitalist service.  Subjective:   She reports that she continues to feel better with regards to her shortness of breath and denies any emerging chest pain, nausea, vomiting or diarrhea.    Objective:   BP (!) 171/39   Pulse 73   Temp 99.1 F (37.3 C) (Oral)   Resp 17   Ht 5\' 6"  (1.676 m)   Wt 78.9 kg (173 lb 15.1 oz)   SpO2 94%   BMI 28.08 kg/m   Intake/Output Summary (Last 24 hours) at 03/03/2017 1132 Last data filed at 03/03/2017 5188 Gross per 24 hour  Intake 326 ml  Output 1775 ml  Net -1449 ml   Weight change:   Physical Exam: Gen: Comfortably resting in bed, family member at bedside CVS: Pulse normal rate, regular rhythm, S1 and S2 normal Resp: fine rales left base otherwise expiratory rhonchi over both lung  fields Abd: Soft, flat, nontender Ext: 1+ pitting edema of the lower extremities (notably improved from yesterday)  Imaging: Dg Chest Port 1 View  Result Date: 03/02/2017 CLINICAL DATA:  Shortness of Breath EXAM: PORTABLE CHEST 1 VIEW COMPARISON:  February 28, 2017 FINDINGS: There is persistent cardiomegaly with mild pulmonary venous hypertension. There is airspace consolidation throughout the left lower lobe with more patchy infiltrate in the right lower lobe. There is aortic atherosclerosis. No evident adenopathy. No bone lesions. IMPRESSION: Persistent pulmonary vascular congestion. Airspace opacity bilaterally, consistent with either pneumonia or pulmonary edema. Both entities may exist concurrently. There is somewhat more consolidation in the left lower lobe and right lower lobe. The appearance overall is similar compared to 1 day prior. There is aortic atherosclerosis. Aortic Atherosclerosis (ICD10-I70.0). Electronically Signed   By: Lowella Grip III M.D.   On: 03/02/2017 07:20    Labs: BMET Recent Labs  Lab 02/28/17 1054 03/01/17 0627 03/02/17 0350 03/03/17 0331  NA 139 140  140 139 140  K 3.6 3.3*  3.2* 3.6 3.8  CL 100* 102  101 101 102  CO2 25 27  27 28 29   GLUCOSE 302* 239*  239* 194* 109*  BUN 62* 63*  63* 61* 61*  CREATININE 2.81* 2.57*  2.52* 2.49* 2.49*  CALCIUM 8.5* 8.5*  8.4* 8.6* 8.7*  PHOS  --  4.0 3.5 3.8   CBC Recent Labs  Lab 02/28/17 1054 03/02/17 0350  WBC 8.0 5.5  HGB 9.4* 8.6*  HCT 29.9* 27.0*  MCV 87.4 87.4  PLT  185 164   Medications:    . allopurinol  100 mg Oral Daily  . amLODipine  10 mg Oral Daily  . aspirin  81 mg Oral Daily  . carvedilol  12.5 mg Oral BID WC  . cloNIDine  0.2 mg Oral BID  . clopidogrel  75 mg Oral Q breakfast  . heparin  5,000 Units Subcutaneous Q8H  . insulin aspart  0-20 Units Subcutaneous TID WC  . insulin detemir  12 Units Subcutaneous QHS  . isosorbide-hydrALAZINE  2 tablet Oral TID  . potassium  chloride  40 mEq Oral Daily  . pravastatin  80 mg Oral Daily   Elmarie Shiley, MD 03/03/2017, 11:32 AM

## 2017-03-03 NOTE — Progress Notes (Signed)
PROGRESS NOTE    Glenda [name]  ZOX:[id number] DOB: Mar 02, 1934 DOA: 02/28/2017 PCP: Patient, No Pcp Per   Brief Narrative: 83 year old female with history of hypertension, type 2 diabetes, multiple a stroke with residual right upper extremity weakness, history of breast cancer status post right lumpectomy, chronic kidney disease is stage IV follows up with the nephrologist outpatient, presented with worsening shortness of breath and nonproductive cough.. Patient required BiPAP in ER. Chest x-ray with cardiomegaly and pulmonary vascular congestion. Started on Lasix drip and nitro drip. Nephrology team is following.  Assessment & Plan:   # Acute respiratory failure with hypoxia in the setting of acute pulmonary edema/CHF: -Patient is off BiPAP and currently on oxygen via nasal cannula.  Continue diuretics and plan to wean down oxygen gradually.  Repeat chest x-ray consistent with CHF.  I do not think patient has pneumonia.   # Acute on chronic diastolic congestive heart failure: - Last echocardiogram on September 2018 with EF of 65-70% and grade 3 diastolic dysfunction. Patient with elevated troponin likely in the setting of CHF. Cardiology consult appreciated. Continue diuretics. -Patient does not have chest pain. -Continue diuretics, strict in's and out and follow-up cardiology.  # Acute kidney injury on chronic kidney disease stage IV likely hemodynamically mediated in the setting of CHF/renal hypoperfusion. -Serum creatinine level is stable.  Patient has good response with IV diuretics and oral metolazone.  Plan to continue IV Lasix till tomorrow and switch to oral torsemide.  Monitor electrolytes.  Strict ins and outs and daily weight.  Nephrology consult appreciated.  # Hypertensive urgency: Blood pressure is now improving.  Continue current oral medication and IV diuretics.  Patient is currently on clonidine, BiDil, Norvasc, Lasix and metolazone.  # Anemia of chronic kidney  disease/iron deficiency anemia: Received a dose of IV iron. Monitor CBC. No sign of bleeding.  # Type 2 diabetes on insulin: Continue current insulin regimen. Monitor blood sugar level.  # Hypokalemia in the setting of diuretics: Continue oral potassium every day.  -Electrolytes acceptable  PT/OT evaluation ordered  DVT prophylaxis: Heparin subcutaneous Code Status: Full code Family Communication: Discussed with the patient's daughter at bedside Disposition Plan: Current in the stepdown    Consultants:   Nephrology  Cardiology  Procedures: BiPAP on admission Antimicrobials: None  Subjective: Seen and examined at bedside.  Shortness of breath is improving.  No cough or fever.  Denies chest pain, nausea or vomiting.  Patient daughter at bedside.  Objective: Vitals:   03/03/17 0400 03/03/17 0600 03/03/17 0700 03/03/17 0800  BP: (!) 171/45 (!) 152/39  (!) 171/39  Pulse: 60 61 66 73  Resp: 18 19 18 17   Temp:    99.1 F (37.3 C)  TempSrc:    Oral  SpO2: 98% 97% 97% 94%  Weight:      Height:        Intake/Output Summary (Last 24 hours) at 03/03/2017 1230 Last data filed at 03/03/2017 0610 Gross per 24 hour  Intake 326 ml  Output 1775 ml  Net -1449 ml   Filed Weights   02/28/17 0959 02/28/17 1018 02/28/17 2044  Weight: 78.5 kg (173 lb) 78.5 kg (173 lb) 78.9 kg (173 lb 15.1 oz)    Examination:  General exam: Not in distress Respiratory system: Bibasilar crackles, respiratory effort normal, no wheezing Cardiovascular system: Regular rate rhythm, S1-S2 normal, no murmur appreciated.  No pedal edema. Gastrointestinal system: Abdomen soft, nontender.  Bowel sounds positive. Central nervous system: Alert and awake,  following  commands Skin: No rashes, lesions or ulcers Psychiatry: Judgement and insight appear normal. Mood & affect appropriate.     Data Reviewed: I have personally reviewed following labs and imaging studies  CBC: Recent Labs  Lab 02/28/17 1054  03/02/17 0350  WBC 8.0 5.5  HGB 9.4* 8.6*  HCT 29.9* 27.0*  MCV 87.4 87.4  PLT 185 606   Basic Metabolic Panel: Recent Labs  Lab 02/28/17 1054 03/01/17 0627 03/02/17 0350 03/03/17 0331  NA 139 140  140 139 140  K 3.6 3.3*  3.2* 3.6 3.8  CL 100* 102  101 101 102  CO2 25 27  27 28 29   GLUCOSE 302* 239*  239* 194* 109*  BUN 62* 63*  63* 61* 61*  CREATININE 2.81* 2.57*  2.52* 2.49* 2.49*  CALCIUM 8.5* 8.5*  8.4* 8.6* 8.7*  MG  --  1.8 1.7 2.3  PHOS  --  4.0 3.5 3.8   GFR: Estimated Creatinine Clearance: 18.8 mL/min (A) (by C-G formula based on SCr of 2.49 mg/dL (H)). Liver Function Tests: Recent Labs  Lab 02/28/17 1054 03/01/17 0627 03/02/17 0350 03/03/17 0331  AST 20  --   --   --   ALT 13*  --   --   --   ALKPHOS 116  --   --   --   BILITOT 0.6  --   --   --   PROT 7.4  --   --   --   ALBUMIN 3.4* 3.1* 3.0* 2.8*   No results for input(s): LIPASE, AMYLASE in the last 168 hours. No results for input(s): AMMONIA in the last 168 hours. Coagulation Profile: No results for input(s): INR, PROTIME in the last 168 hours. Cardiac Enzymes: Recent Labs  Lab 02/28/17 1054 02/28/17 1706 02/28/17 2308 03/01/17 0627  TROPONINI <0.03 0.37* 0.53* 0.52*   BNP (last 3 results) No results for input(s): PROBNP in the last 8760 hours. HbA1C: Recent Labs    03/01/17 0627  HGBA1C 9.6*   CBG: Recent Labs  Lab 03/02/17 1141 03/02/17 1625 03/02/17 2116 03/03/17 0837 03/03/17 1150  GLUCAP 194* 157* 138* 114* 176*   Lipid Profile: No results for input(s): CHOL, HDL, LDLCALC, TRIG, CHOLHDL, LDLDIRECT in the last 72 hours. Thyroid Function Tests: No results for input(s): TSH, T4TOTAL, FREET4, T3FREE, THYROIDAB in the last 72 hours. Anemia Panel: Recent Labs    03/01/17 0627  FERRITIN 317*  TIBC 203*  IRON 26*   Sepsis Labs: Recent Labs  Lab 02/28/17 1058  LATICACIDVEN 0.88    Recent Results (from the past 240 hour(s))  MRSA PCR Screening     Status:  None   Collection Time: 02/28/17  9:39 PM  Result Value Ref Range Status   MRSA by PCR NEGATIVE NEGATIVE Final    Comment:        The GeneXpert MRSA Assay (FDA approved for NASAL specimens only), is one component of a comprehensive MRSA colonization surveillance program. It is not intended to diagnose MRSA infection nor to guide or monitor treatment for MRSA infections.          Radiology Studies: Dg Chest Port 1 View  Result Date: 03/02/2017 CLINICAL DATA:  Shortness of Breath EXAM: PORTABLE CHEST 1 VIEW COMPARISON:  February 28, 2017 FINDINGS: There is persistent cardiomegaly with mild pulmonary venous hypertension. There is airspace consolidation throughout the left lower lobe with more patchy infiltrate in the right lower lobe. There is aortic atherosclerosis. No evident adenopathy. No bone lesions. IMPRESSION:  Persistent pulmonary vascular congestion. Airspace opacity bilaterally, consistent with either pneumonia or pulmonary edema. Both entities may exist concurrently. There is somewhat more consolidation in the left lower lobe and right lower lobe. The appearance overall is similar compared to 1 day prior. There is aortic atherosclerosis. Aortic Atherosclerosis (ICD10-I70.0). Electronically Signed   By: Lowella Grip III M.D.   On: 03/02/2017 07:20        Scheduled Meds: . allopurinol  100 mg Oral Daily  . amLODipine  10 mg Oral Daily  . aspirin  81 mg Oral Daily  . carvedilol  12.5 mg Oral BID WC  . cloNIDine  0.2 mg Oral BID  . clopidogrel  75 mg Oral Q breakfast  . heparin  5,000 Units Subcutaneous Q8H  . insulin aspart  0-20 Units Subcutaneous TID WC  . insulin detemir  12 Units Subcutaneous QHS  . isosorbide-hydrALAZINE  2 tablet Oral TID  . potassium chloride  40 mEq Oral Daily  . pravastatin  80 mg Oral Daily   Continuous Infusions: . furosemide Stopped (03/03/17 0720)     LOS: 3 days    Emylia Latella Tanna Furry, MD [facility] Pager  [phone]  If 7PM-7AM, please contact night-coverage [email or web address] Password [email or web address] 03/03/2017, 12:30 PM

## 2017-03-03 NOTE — Consult Note (Signed)
Ref: Patient, No Pcp Per   Subjective:  Sinus rhythm. VS stable. Renal function stable. Fair diuresis with IV lasix.  Objective:  Vital Signs in the last 24 hours: Temp:  [98.1 F (36.7 C)-99.1 F (37.3 C)] 99 F (37.2 C) (11/08 1200) Pulse Rate:  [58-73] 64 (11/08 1400) Cardiac Rhythm: Normal sinus rhythm (11/08 0700) Resp:  [0-19] 17 (11/08 1400) BP: (136-171)/(31-84) 156/31 (11/08 1400) SpO2:  [94 %-100 %] 98 % (11/08 1400)  Physical Exam: BP Readings from Last 1 Encounters:  03/03/17 (!) 156/31     Wt Readings from Last 1 Encounters:  02/28/17 78.9 kg (173 lb 15.1 oz)    Weight change:  Body mass index is 28.08 kg/m. HEENT: [place]/AT, Eyes-Brown, PERL, EOMI, Conjunctiva-Pink, Sclera-Non-icteric Neck: No JVD, No bruit, Trachea midline. Lungs:  Clearing, Bilateral. Cardiac:  Regular rhythm, normal S1 and S2, no S3. II/VI systolic murmur. Abdomen:  Soft, non-tender. BS present. Extremities:  No edema present. No cyanosis. No clubbing. CNS: AxOx3, Cranial nerves grossly intact, moves all 4 extremities.  Skin: Warm and dry.   Intake/Output from previous day: 11/07 0701 - 11/08 0700 In: 558 [P.O.:210; IV Piggyback:248] Out: 2635 [Urine:2635]    Lab Results: BMET    Component Value Date/Time   NA 140 03/03/2017 0331   NA 139 03/02/2017 0350   NA 140 03/01/2017 0627   NA 140 03/01/2017 0627   NA 140 09/16/2015   NA 136 (A) 09/04/2015   NA 141 05/07/2015 1251   K 3.8 03/03/2017 0331   K 3.6 03/02/2017 0350   K 3.3 (L) 03/01/2017 0627   K 3.2 (L) 03/01/2017 0627   K 4.3 05/07/2015 1251   CL 102 03/03/2017 0331   CL 101 03/02/2017 0350   CL 102 03/01/2017 0627   CL 101 03/01/2017 0627   CO2 29 03/03/2017 0331   CO2 28 03/02/2017 0350   CO2 27 03/01/2017 0627   CO2 27 03/01/2017 0627   CO2 22 05/07/2015 1251   GLUCOSE 109 (H) 03/03/2017 0331   GLUCOSE 194 (H) 03/02/2017 0350   GLUCOSE 239 (H) 03/01/2017 0627   GLUCOSE 239 (H) 03/01/2017 0627   GLUCOSE 110  05/07/2015 1251   BUN 61 (H) 03/03/2017 0331   BUN 61 (H) 03/02/2017 0350   BUN 63 (H) 03/01/2017 0627   BUN 63 (H) 03/01/2017 0627   BUN 34 (A) 09/16/2015   BUN 73 (A) 09/04/2015   BUN 22.5 05/07/2015 1251   CREATININE 2.49 (H) 03/03/2017 0331   CREATININE 2.49 (H) 03/02/2017 0350   CREATININE 2.57 (H) 03/01/2017 0627   CREATININE 2.52 (H) 03/01/2017 0627   CREATININE 1.2 (H) 05/07/2015 1251   CALCIUM 8.7 (L) 03/03/2017 0331   CALCIUM 8.6 (L) 03/02/2017 0350   CALCIUM 8.5 (L) 03/01/2017 0627   CALCIUM 8.4 (L) 03/01/2017 0627   CALCIUM 9.1 05/07/2015 1251   GFRNONAA 17 (L) 03/03/2017 0331   GFRNONAA 17 (L) 03/02/2017 0350   GFRNONAA 16 (L) 03/01/2017 0627   GFRNONAA 17 (L) 03/01/2017 0627   GFRAA 20 (L) 03/03/2017 0331   GFRAA 20 (L) 03/02/2017 0350   GFRAA 19 (L) 03/01/2017 0627   GFRAA 19 (L) 03/01/2017 0627   CBC    Component Value Date/Time   WBC 5.5 03/02/2017 0350   RBC 3.09 (L) 03/02/2017 0350   HGB 8.6 (L) 03/02/2017 0350   HGB 11.0 (L) 05/07/2015 1251   HCT 27.0 (L) 03/02/2017 0350   HCT 34.0 (L) 05/07/2015 1251  PLT 164 03/02/2017 0350   PLT 207 05/07/2015 1251   MCV 87.4 03/02/2017 0350   MCV 84.0 05/07/2015 1251   MCH 27.8 03/02/2017 0350   MCHC 31.9 03/02/2017 0350   RDW 14.4 03/02/2017 0350   RDW 12.8 05/07/2015 1251   LYMPHSABS 0.9 01/02/2017 0916   LYMPHSABS 1.7 05/07/2015 1251   MONOABS 0.6 01/02/2017 0916   MONOABS 0.5 05/07/2015 1251   EOSABS 0.2 01/02/2017 0916   EOSABS 0.0 05/07/2015 1251   BASOSABS 0.0 01/02/2017 0916   BASOSABS 0.0 05/07/2015 1251   HEPATIC Function Panel Recent Labs    01/02/17 0916 02/28/17 1054  PROT 7.4 7.4   HEMOGLOBIN A1C No components found for: HGA1C,  MPG CARDIAC ENZYMES Lab Results  Component Value Date   CKTOTAL 487 (H) 03/07/2009   CKMB 4.2 (H) 03/07/2009   TROPONINI 0.52 (HH) 03/01/2017   TROPONINI 0.53 (HH) 02/28/2017   TROPONINI 0.37 (HH) 02/28/2017   BNP No results for input(s): PROBNP in  the last 8760 hours. TSH Recent Labs    01/02/17 1535  TSH 1.145   CHOLESTEROL No results for input(s): CHOL in the last 8760 hours.  Scheduled Meds: . allopurinol  100 mg Oral Daily  . amLODipine  10 mg Oral Daily  . aspirin  81 mg Oral Daily  . carvedilol  12.5 mg Oral BID WC  . cloNIDine  0.2 mg Oral BID  . clopidogrel  75 mg Oral Q breakfast  . heparin  5,000 Units Subcutaneous Q8H  . insulin aspart  0-20 Units Subcutaneous TID WC  . insulin detemir  12 Units Subcutaneous QHS  . isosorbide-hydrALAZINE  2 tablet Oral TID  . potassium chloride  40 mEq Oral Daily  . pravastatin  80 mg Oral Daily   Continuous Infusions: . furosemide Stopped (03/03/17 1350)   PRN Meds:.acetaminophen  Assessment/Plan: Acute diastolic left heart failure Abnormal Troponin-I from demand ischemia Hypertension DM, II CKD, III S/P stroke Iron deficiency anemia  Continue medical treatment. Discussed future need for cardiac interventions with family.   LOS: 3 days    Dixie Dials  MD  03/03/2017, 4:27 PM

## 2017-03-03 NOTE — Progress Notes (Signed)
Date: March 03, 2017 Glenda [name], BSN, [place], [place] Chart and notes review for patient progress and needs. Will follow for case management and discharge needs. Next review date: [id number]

## 2017-03-04 LAB — GLUCOSE, CAPILLARY
GLUCOSE-CAPILLARY: 190 mg/dL — AB (ref 65–99)
GLUCOSE-CAPILLARY: 301 mg/dL — AB (ref 65–99)
Glucose-Capillary: 229 mg/dL — ABNORMAL HIGH (ref 65–99)
Glucose-Capillary: 191 mg/dL — ABNORMAL HIGH (ref 65–99)

## 2017-03-04 LAB — RENAL FUNCTION PANEL
ALBUMIN: 2.9 g/dL — AB (ref 3.5–5.0)
ANION GAP: 9 (ref 5–15)
BUN: 61 mg/dL — AB (ref 6–20)
CO2: 29 mmol/L (ref 22–32)
Calcium: 8.7 mg/dL — ABNORMAL LOW (ref 8.9–10.3)
Chloride: 99 mmol/L — ABNORMAL LOW (ref 101–111)
Creatinine, Ser: 2.51 mg/dL — ABNORMAL HIGH (ref 0.44–1.00)
GFR calc Af Amer: 20 mL/min — ABNORMAL LOW (ref >60)
GFR, EST NON AFRICAN AMERICAN: 17 mL/min — AB (ref >60)
Glucose, Bld: 132 mg/dL — ABNORMAL HIGH (ref 65–99)
PHOSPHORUS: 4 mg/dL (ref 2.5–4.6)
POTASSIUM: 4.9 mmol/L (ref 3.5–5.1)
Sodium: 137 mmol/L (ref 135–145)

## 2017-03-04 MED ORDER — METOLAZONE 5 MG PO TABS
5.0000 mg | ORAL_TABLET | Freq: Once | ORAL | Status: AC
  Administered 2017-03-04: 5 mg via ORAL
  Filled 2017-03-04 (×2): qty 1

## 2017-03-04 MED ORDER — POTASSIUM CHLORIDE CRYS ER 20 MEQ PO TBCR
40.0000 meq | EXTENDED_RELEASE_TABLET | Freq: Every day | ORAL | Status: DC
  Administered 2017-03-05 – 2017-03-06 (×2): 40 meq via ORAL
  Filled 2017-03-04 (×3): qty 2

## 2017-03-04 MED ORDER — TORSEMIDE 100 MG PO TABS
100.0000 mg | ORAL_TABLET | Freq: Two times a day (BID) | ORAL | Status: DC
  Administered 2017-03-04 – 2017-03-06 (×4): 100 mg via ORAL
  Filled 2017-03-04 (×6): qty 1

## 2017-03-04 MED ORDER — ALUM & MAG HYDROXIDE-SIMETH 200-200-20 MG/5ML PO SUSP: 30.0000 mL | ORAL | Status: DC | PRN

## 2017-03-04 MED ORDER — FAMOTIDINE 20 MG PO TABS
20.0000 mg | ORAL_TABLET | Freq: Every day | ORAL | Status: DC
  Administered 2017-03-05 – 2017-03-06 (×2): 20 mg via ORAL
  Filled 2017-03-04 (×2): qty 1

## 2017-03-04 MED ORDER — HEPARIN SODIUM (PORCINE) 5000 UNIT/ML IJ SOLN
5000.0000 [IU] | Freq: Three times a day (TID) | INTRAMUSCULAR | Status: DC
  Administered 2017-03-04 – 2017-03-06 (×8): 5000 [IU] via SUBCUTANEOUS
  Filled 2017-03-04 (×8): qty 1

## 2017-03-04 NOTE — Progress Notes (Signed)
PROGRESS NOTE  Glenda [name]  DDU:[id number] DOB: 08-Jul-1933 DOA: 02/28/2017 PCP: Patient, No Pcp Per  Brief Narrative: 83 year old female with history of hypertension, type 2 diabetes, multiple a stroke with residual right upper extremity weakness, history of breast cancer status post right lumpectomy, chronic kidney disease is stage IV follows up with the nephrologist outpatient, presented with worsening shortness of breath and nonproductive cough.. Patient required BiPAP in ER. Chest x-ray with cardiomegaly and pulmonary vascular congestion. Started on Lasix drip and nitro drip. Nephrology team is following. Improving, so changed to oral torsemide per nephrology recommendations.   Assessment & Plan: Active Problems:   Hypoxia   Acute renal failure superimposed on chronic kidney disease (HCC)   Acute respiratory failure with hypoxia (HCC)   Hypokalemia  Acute respiratory failure with hypoxia in the setting of acute pulmonary edema, acute HFpEF: - Continue supplemental oxygen to maintain SpO2 >90%.  - Treat acute diastolic CHF as below    Acute on chronic diastolic congestive heart failure: Recent echo (Sept 2018) EF of 65-70% and grade 3 diastolic dysfunction.  - Transition IV lasix to po torsemide per nephrology recommendations, additional metolazone - Continue strict I/O, daily weights. UOP seems more than weights would suggest.  Troponin elevation: suspect demand ischemia in the setting of CHF. No chest pain.  - Cardiology consulted, no interventions planned as inpatient.   Acute kidney injury on chronic kidney disease stage IV: Likely hemodynamically mediated in the setting of CHF/renal hypoperfusion. - SCr remains stable.  - Appreciate nephrology recommendations - Continue metolazone, change IV lasix to po torsemide 11/9 and DC foley to encourage OOB  Hypertensive urgency: BP overall improving, none in severe range.  - Continue current oral medications: clonidine, BiDil,  Norvasc - Continue torsemide and metolazone.  Anemia of chronic kidney disease/iron deficiency anemia: Received a dose of IV iron. No sign of bleeding. - Monitor CBC.   IDT2DM:  - Continue current insulin regimen.  - Monitor blood sugar level.  Hypokalemia in the setting of diuretics: Resolved with replacement, K up to 4.9 this AM, so will hold potassium today.  - Recheck metabolic panel in AM  DVT prophylaxis: Subcutaneous heparin Code Status: Full Family Communication: Daughter at bedside Disposition Plan: PT/OT evaluation. Transfer to floor today.   Consultants:   Cardiology, [facility]  Nephrology, [facility]  Procedures:   BiPAP on admission  Antimicrobials:  None   Subjective: Dyspnea is improving, denies this today but has not gotten up. No chest pain. Denies orthopnea, states leg swelling is significantly improved.   Objective: Vitals:   03/04/17 0400 03/04/17 0500 03/04/17 0800 03/04/17 0821  BP: (!) 155/37   (!) 170/68  Pulse: 63   95  Resp: (!) 21     Temp: 97.8 F (36.6 C)  97.7 F (36.5 C)   TempSrc: Axillary  Axillary   SpO2: 100%     Weight:  77.7 kg (171 lb 4.8 oz)    Height:        Intake/Output Summary (Last 24 hours) at 03/04/2017 2376 Last data filed at 03/04/2017 0400 Gross per 24 hour  Intake -  Output 2200 ml  Net -2200 ml   Filed Weights   02/28/17 1018 02/28/17 2044 03/04/17 0500  Weight: 78.5 kg (173 lb) 78.9 kg (173 lb 15.1 oz) 77.7 kg (171 lb 4.8 oz)    Gen: Elderly female in no distress Pulm: Non-labored breathing 2L O2 with crackles through bilateral lower lung fields. CV: Regular rate and rhythm. No murmur,  rub, or gallop. No JVD, no appreciable LE edema. GI: Abdomen soft, non-tender, non-distended, with normoactive bowel sounds. No organomegaly or masses felt. Ext: Warm, no deformities Skin: No rashes, lesions no ulcers Neuro: Alert and oriented. No focal neurological deficits. Psych: Judgement and insight appear normal.  Mood & affect appropriate.   Data Reviewed: I have personally reviewed following labs and imaging studies  CBC: Recent Labs  Lab 02/28/17 1054 03/02/17 0350  WBC 8.0 5.5  HGB 9.4* 8.6*  HCT 29.9* 27.0*  MCV 87.4 87.4  PLT 185 638   Basic Metabolic Panel: Recent Labs  Lab 02/28/17 1054 03/01/17 0627 03/02/17 0350 03/03/17 0331 03/04/17 0348  NA 139 140  140 139 140 137  K 3.6 3.3*  3.2* 3.6 3.8 4.9  CL 100* 102  101 101 102 99*  CO2 25 27  27 28 29 29   GLUCOSE 302* 239*  239* 194* 109* 132*  BUN 62* 63*  63* 61* 61* 61*  CREATININE 2.81* 2.57*  2.52* 2.49* 2.49* 2.51*  CALCIUM 8.5* 8.5*  8.4* 8.6* 8.7* 8.7*  MG  --  1.8 1.7 2.3  --   PHOS  --  4.0 3.5 3.8 4.0   GFR: Estimated Creatinine Clearance: 18.5 mL/min (A) (by C-G formula based on SCr of 2.51 mg/dL (H)). Liver Function Tests: Recent Labs  Lab 02/28/17 1054 03/01/17 0627 03/02/17 0350 03/03/17 0331 03/04/17 0348  AST 20  --   --   --   --   ALT 13*  --   --   --   --   ALKPHOS 116  --   --   --   --   BILITOT 0.6  --   --   --   --   PROT 7.4  --   --   --   --   ALBUMIN 3.4* 3.1* 3.0* 2.8* 2.9*   No results for input(s): LIPASE, AMYLASE in the last 168 hours. No results for input(s): AMMONIA in the last 168 hours. Coagulation Profile: No results for input(s): INR, PROTIME in the last 168 hours. Cardiac Enzymes: Recent Labs  Lab 02/28/17 1054 02/28/17 1706 02/28/17 2308 03/01/17 0627  TROPONINI <0.03 0.37* 0.53* 0.52*   BNP (last 3 results) No results for input(s): PROBNP in the last 8760 hours. HbA1C: No results for input(s): HGBA1C in the last 72 hours. CBG: Recent Labs  Lab 03/03/17 0837 03/03/17 1150 03/03/17 1541 03/03/17 2134 03/04/17 0807  GLUCAP 114* 176* 251* 107* 190*   Lipid Profile: No results for input(s): CHOL, HDL, LDLCALC, TRIG, CHOLHDL, LDLDIRECT in the last 72 hours. Thyroid Function Tests: No results for input(s): TSH, T4TOTAL, FREET4, T3FREE, THYROIDAB  in the last 72 hours. Anemia Panel: No results for input(s): VITAMINB12, FOLATE, FERRITIN, TIBC, IRON, RETICCTPCT in the last 72 hours. Urine analysis:    Component Value Date/Time   COLORURINE YELLOW 03/01/2017 0100   APPEARANCEUR CLEAR 03/01/2017 0100   LABSPEC 1.011 03/01/2017 0100   PHURINE 5.0 03/01/2017 0100   GLUCOSEU NEGATIVE 03/01/2017 0100   HGBUR NEGATIVE 03/01/2017 0100   BILIRUBINUR NEGATIVE 03/01/2017 0100   KETONESUR NEGATIVE 03/01/2017 0100   PROTEINUR 100 (A) 03/01/2017 0100   UROBILINOGEN 0.2 07/04/2014 1900   NITRITE NEGATIVE 03/01/2017 0100   LEUKOCYTESUR NEGATIVE 03/01/2017 0100   Recent Results (from the past 240 hour(s))  MRSA PCR Screening     Status: None   Collection Time: 02/28/17  9:39 PM  Result Value Ref Range Status   MRSA  by PCR NEGATIVE NEGATIVE Final    Comment:        The GeneXpert MRSA Assay (FDA approved for NASAL specimens only), is one component of a comprehensive MRSA colonization surveillance program. It is not intended to diagnose MRSA infection nor to guide or monitor treatment for MRSA infections.       Radiology Studies: No results found.  Scheduled Meds: . allopurinol  100 mg Oral Daily  . amLODipine  10 mg Oral Daily  . aspirin  81 mg Oral Daily  . carvedilol  12.5 mg Oral BID WC  . cloNIDine  0.2 mg Oral BID  . clopidogrel  75 mg Oral Q breakfast  . famotidine  20 mg Oral BID  . heparin  5,000 Units Subcutaneous Q8H  . insulin aspart  0-20 Units Subcutaneous TID WC  . insulin detemir  12 Units Subcutaneous QHS  . isosorbide-hydrALAZINE  2 tablet Oral TID  . potassium chloride  40 mEq Oral Daily  . pravastatin  80 mg Oral Daily   Continuous Infusions: . furosemide 160 mg (03/04/17 0538)     LOS: 4 days   Time spent: 25 minutes.  Vance Gather, MD [facility] Pager [phone]  If 7PM-7AM, please contact night-coverage [email or web address] Password [email or web address] 03/04/2017, 9:28 AM

## 2017-03-04 NOTE — Evaluation (Signed)
Occupational Therapy Evaluation Patient Details Name: Glenda [name] MRN: [id number] DOB: 11-15-1933 Today's Date: 03/04/2017    History of Present Illness pt was admitted for hypoxia.  PMH:  HTN, DM, CVAs with right UE residual weakness   Clinical Impression   This 83 year old female was admitted for the above.  At baseline, she has 24/7 assistance from children and has help for adls as needed. She is mod I/supervision walking to bathroom. OT goals will focus on this and standing at sink for grooming    Follow Up Recommendations  Supervision/Assistance - 24 hour    Equipment Recommendations  None recommended by OT    Recommendations for Other Services       Precautions / Restrictions Precautions Precautions: Fall Restrictions Weight Bearing Restrictions: No      Mobility Bed Mobility Overal bed mobility: Needs Assistance Bed Mobility: Supine to Sit     Supine to sit: Min assist     General bed mobility comments: assist to scoot forward to eob  Transfers Overall transfer level: Needs assistance Equipment used: Rolling walker (2 wheeled) Transfers: Sit to/from Stand Sit to Stand: Min assist         General transfer comment: assist to rise and steady.      Balance                                           ADL either performed or assessed with clinical judgement   ADL Overall ADL's : Needs assistance/impaired Eating/Feeding: Set up;Sitting   Grooming: Set up;Sitting   Upper Body Bathing: Minimal assistance;Sitting   Lower Body Bathing: Moderate assistance;Sit to/from stand   Upper Body Dressing : Minimal assistance;Sitting   Lower Body Dressing: Maximal assistance;Sit to/from stand   Toilet Transfer: Minimal assistance;Ambulation;RW(chair)   Toileting- Clothing Manipulation and Hygiene: Moderate assistance;Sit to/from stand         General ADL Comments: daughter present in room. Pt very motivated to get OOB and walk      Vision         Perception     Praxis      Pertinent Vitals/Pain Pain Assessment: No/denies pain     Hand Dominance     Extremity/Trunk Assessment Upper Extremity Assessment Upper Extremity Assessment: RUE deficits/detail RUE Deficits / Details: has movement throughout. can lift R shoulder to approximately 70. Able to maintain grip on RW           Communication Communication Communication: No difficulties   Cognition Arousal/Alertness: Awake/alert Behavior During Therapy: WFL for tasks assessed/performed Overall Cognitive Status: Within Functional Limits for tasks assessed                                     General Comments  when walking, decreased motor control when placing RLE. Cues to step up into walker.  VSS used 2 liters 02    Exercises     Shoulder Instructions      Home Living Family/patient expects to be discharged to:: Private residence Living Arrangements: Children Available Help at Discharge: Family;Available 24 hours/day;Friend(s)(5 alternate) Type of Home: House Home Access: Ramped entrance     Home Layout: One level     Bathroom Shower/Tub: Teacher, early years/pre: Standard     Home Equipment: Environmental consultant - 2 wheels;Wheelchair -  manual;Bedside commode;Tub bench          Prior Functioning/Environment Level of Independence: Needs assistance    ADL's / Homemaking Assistance Needed: family assists with adls   Comments: uses RW        OT Problem List: Decreased strength;Decreased activity tolerance;Impaired balance (sitting and/or standing)      OT Treatment/Interventions: Self-care/ADL training;DME and/or AE instruction;Patient/family education;Balance training    OT Goals(Current goals can be found in the care plan section) Acute Rehab OT Goals Patient Stated Goal: walk OT Goal Formulation: With patient Time For Goal Achievement: 03/11/17 Potential to Achieve Goals: Good ADL Goals Pt Will Perform  Grooming: with min guard assist;standing Pt Will Transfer to Toilet: with min guard assist;bedside commode;ambulating  OT Frequency: Min 2X/week   Barriers to D/C:            Co-evaluation PT/OT/SLP Co-Evaluation/Treatment: Yes Reason for Co-Treatment: For patient/therapist safety PT goals addressed during session: Mobility/safety with mobility        AM-PAC PT "6 Clicks" Daily Activity     Outcome Measure Help from another person eating meals?: A Little Help from another person taking care of personal grooming?: A Little Help from another person toileting, which includes using toliet, bedpan, or urinal?: A Lot Help from another person bathing (including washing, rinsing, drying)?: A Lot Help from another person to put on and taking off regular upper body clothing?: A Little Help from another person to put on and taking off regular lower body clothing?: A Lot 6 Click Score: 15   End of Session    Activity Tolerance: Patient tolerated treatment well Patient left: in chair;with call bell/phone within reach;with family/visitor present  OT Visit Diagnosis: Muscle weakness (generalized) (M62.81);Unsteadiness on feet (R26.81)                Time: 6073-7106 OT Time Calculation (min): 28 min Charges:  OT General Charges $OT Visit: 1 Visit OT Evaluation $OT Eval Low Complexity: 1 Low G-Codes:     [place], OTR/L [phone] 03/04/2017  Maizie Garno 03/04/2017, 12:32 PM

## 2017-03-04 NOTE — Progress Notes (Signed)
Pt has not had much of an appetite and has had lower than normal CBGs. Pt was due to have 12 units of Levemir, however CBG was 107. Notified MD and verbal orders given to hold medication.

## 2017-03-04 NOTE — Evaluation (Signed)
Physical Therapy Evaluation Patient Details Name: Glenda [name] MRN: [id number] DOB: 19-Jun-1933 Today's Date: 03/04/2017   History of Present Illness  pt was admitted for hypoxia.  PMH:  HTN, DM, CVAs with right UE residual weakness  Clinical Impression  Pt admitted as above and presenting with functional mobility limitations 2* generalized weakness, balance deficits, limited endurance and residual deficits associated with previous CVA.  Pt should progress to dc home with family assist 24/7.    Follow Up Recommendations Home health PT    Equipment Recommendations  None recommended by PT    Recommendations for Other Services       Precautions / Restrictions Precautions Precautions: Fall Restrictions Weight Bearing Restrictions: No      Mobility  Bed Mobility Overal bed mobility: Needs Assistance Bed Mobility: Supine to Sit     Supine to sit: Min assist     General bed mobility comments: assist to scoot forward to eob  Transfers Overall transfer level: Needs assistance Equipment used: Rolling walker (2 wheeled) Transfers: Sit to/from Stand Sit to Stand: Min assist         General transfer comment: assist to rise and steady.    Ambulation/Gait Ambulation/Gait assistance: Min assist Ambulation Distance (Feet): 35 Feet(35 twice with seated rest break between) Assistive device: Rolling walker (2 wheeled) Gait Pattern/deviations: Step-through pattern;Decreased step length - right;Shuffle;Trunk flexed;Wide base of support Gait velocity: decr Gait velocity interpretation: Below normal speed for age/gender General Gait Details: cues for posture and position from RW.  Pt with increased difficulty advancing and placing R LE 2* deficits from previous CVA  Stairs            Wheelchair Mobility    Modified Rankin (Stroke Patients Only)       Balance Overall balance assessment: Needs assistance Sitting-balance support: Feet supported;No upper extremity  supported Sitting balance-Leahy Scale: Good     Standing balance support: Bilateral upper extremity supported Standing balance-Leahy Scale: Poor                               Pertinent Vitals/Pain Pain Assessment: No/denies pain    Home Living Family/patient expects to be discharged to:: Private residence Living Arrangements: Children Available Help at Discharge: Family;Available 24 hours/day;Friend(s) Type of Home: House Home Access: Ramped entrance     Home Layout: One level Home Equipment: [place] - 2 wheels;Wheelchair - [employer];Tub bench Additional Comments: daughter reports pt has had 24/7 supervision for the last 2 years    Prior Function Level of Independence: Needs assistance   Gait / Transfers Assistance Needed: RW in home, WC out of house  ADL's / Homemaking Assistance Needed: family assists with adls  Comments: uses RW     Hand Dominance        Extremity/Trunk Assessment   Upper Extremity Assessment Upper Extremity Assessment: Defer to OT evaluation RUE Deficits / Details: has movement throughout. can lift R shoulder to approximately 70. Able to maintain grip on RW    Lower Extremity Assessment Lower Extremity Assessment: Generalized weakness;RLE deficits/detail RLE Deficits / Details: Strength 4-/5 but with increased concentration required by pt to control movement.  Residual defict from previous CVA RLE Sensation: decreased proprioception RLE Coordination: decreased gross motor;decreased fine motor       Communication   Communication: No difficulties  Cognition Arousal/Alertness: Awake/alert Behavior During Therapy: WFL for tasks assessed/performed Overall Cognitive Status: Within Functional Limits for tasks assessed  General Comments General comments (skin integrity, edema, etc.): when walking, decreased motor control when placing RLE. Cues to step up into walker.   VSS used 2 liters 02    Exercises     Assessment/Plan    PT Assessment Patient needs continued PT services  PT Problem List Decreased strength;Decreased activity tolerance;Decreased balance;Decreased mobility;Decreased knowledge of use of DME       PT Treatment Interventions DME instruction;Gait training;Functional mobility training;Therapeutic activities;Therapeutic exercise;Balance training;Patient/family education    PT Goals (Current goals can be found in the Care Plan section)  Acute Rehab PT Goals Patient Stated Goal: walk PT Goal Formulation: With patient Time For Goal Achievement: 03/18/17 Potential to Achieve Goals: Fair    Frequency Min 3X/week   Barriers to discharge        Co-evaluation PT/OT/SLP Co-Evaluation/Treatment: Yes Reason for Co-Treatment: For patient/therapist safety PT goals addressed during session: Mobility/safety with mobility OT goals addressed during session: ADL's and self-care       AM-PAC PT "6 Clicks" Daily Activity  Outcome Measure Difficulty turning over in bed (including adjusting bedclothes, sheets and blankets)?: A Little Difficulty moving from lying on back to sitting on the side of the bed? : A Little Difficulty sitting down on and standing up from a chair with arms (e.g., wheelchair, bedside commode, etc,.)?: A Little Help needed moving to and from a bed to chair (including a wheelchair)?: A Little Help needed walking in hospital room?: A Little Help needed climbing 3-5 steps with a railing? : A Lot 6 Click Score: 17    End of Session Equipment Utilized During Treatment: Gait belt;Oxygen Activity Tolerance: Patient tolerated treatment well;Patient limited by fatigue Patient left: in chair;with call bell/phone within reach;with family/visitor present Nurse Communication: Mobility status PT Visit Diagnosis: Unsteadiness on feet (R26.81);Muscle weakness (generalized) (M62.81);History of falling (Z91.81);Difficulty in walking,  not elsewhere classified (R26.2);Hemiplegia and hemiparesis Hemiplegia - Right/Left: Left Hemiplegia - caused by: Cerebral infarction    Time: 1114-1140 PT Time Calculation (min) (ACUTE ONLY): 26 min   Charges:   PT Evaluation $PT Eval Low Complexity: 1 Low     PT G Codes:        Pg [phone]   Kiel Cockerell 03/04/2017, 12:42 PM

## 2017-03-04 NOTE — Progress Notes (Signed)
Patient ID: Glenda [name], female   DOB: 05/25/1933, 83 y.o.   MRN: [id number]  [facility] Progress Note   Assessment/ Plan:   1. Acute hypoxic respiratory failure:  appears to be most likely from CHF exacerbation with element of bronchospasm but no clear evidence of pneumonia. She continues to improve well from a clinical standpoint with good diuresis overnight and symptomatic improvement. Paradoxically, has only lost about 1.5 kg since admission however is net negative 4.5 L.  2. Acute kidney injury on chronic kidney disease stage IV: (Baseline creatinine ~2.3) likely hemodynamically mediated acute kidney injury in the face of acute CHF exacerbation with renal hypoperfusion. She continues to have fair urine output on current diuretic dosing-furosemide augmented with metolazone-will give additional metolazone tonight and convert her over to oral torsemide. 3. Hypertension:  blood pressures improving with ongoing diuretic therapy 4. Anemia of chronic kidney disease:  with significant iron deficiency--now s/p first dose of IV feraheme. 5. Diabetes mellitus: Management per hospitalist service.  Subjective:   Reports to be feeling better, denies chest pain or shortness of breath.     Objective:   BP (!) 130/93   Pulse 95   Temp 97.7 F (36.5 C) (Axillary)   Resp 15   Ht 5\' 6"  (1.676 m)   Wt 77.7 kg (171 lb 4.8 oz)   SpO2 96%   BMI 27.65 kg/m   Intake/Output Summary (Last 24 hours) at 03/04/2017 1143 Last data filed at 03/04/2017 0400 Gross per 24 hour  Intake -  Output 2200 ml  Net -2200 ml   Weight change:   Physical Exam: Gen: Comfortably sitting up in recliner, eating lunch  CVS: Pulse normal rate, regular rhythm, S1 and S2 normal Resp: Fine rales/coarse breath sounds bilaterally without rhonchi. Abd: Soft, flat, nontender Ext: Trace edema of the lower extremities (notably improved since admission), 1+ dependent edema  Imaging: No results  found.  Labs: BMET Recent Labs  Lab 02/28/17 1054 03/01/17 0627 03/02/17 0350 03/03/17 0331 03/04/17 0348  NA 139 140  140 139 140 137  K 3.6 3.3*  3.2* 3.6 3.8 4.9  CL 100* 102  101 101 102 99*  CO2 25 27  27 28 29 29   GLUCOSE 302* 239*  239* 194* 109* 132*  BUN 62* 63*  63* 61* 61* 61*  CREATININE 2.81* 2.57*  2.52* 2.49* 2.49* 2.51*  CALCIUM 8.5* 8.5*  8.4* 8.6* 8.7* 8.7*  PHOS  --  4.0 3.5 3.8 4.0   CBC Recent Labs  Lab 02/28/17 1054 03/02/17 0350  WBC 8.0 5.5  HGB 9.4* 8.6*  HCT 29.9* 27.0*  MCV 87.4 87.4  PLT 185 164   Medications:    . allopurinol  100 mg Oral Daily  . amLODipine  10 mg Oral Daily  . aspirin  81 mg Oral Daily  . carvedilol  12.5 mg Oral BID WC  . cloNIDine  0.2 mg Oral BID  . clopidogrel  75 mg Oral Q breakfast  . famotidine  20 mg Oral BID  . heparin  5,000 Units Subcutaneous Q8H  . insulin aspart  0-20 Units Subcutaneous TID WC  . insulin detemir  12 Units Subcutaneous QHS  . isosorbide-hydrALAZINE  2 tablet Oral TID  . [START ON 03/05/2017] potassium chloride  40 mEq Oral Daily  . pravastatin  80 mg Oral Daily   Elmarie Shiley, MD 03/04/2017, 11:43 AM

## 2017-03-05 DIAGNOSIS — R0602 Shortness of breath: Secondary | ICD-10-CM

## 2017-03-05 LAB — GLUCOSE, CAPILLARY
GLUCOSE-CAPILLARY: 198 mg/dL — AB (ref 65–99)
GLUCOSE-CAPILLARY: 209 mg/dL — AB (ref 65–99)
Glucose-Capillary: 93 mg/dL (ref 65–99)
Glucose-Capillary: 250 mg/dL — ABNORMAL HIGH (ref 65–99)

## 2017-03-05 LAB — RENAL FUNCTION PANEL
ANION GAP: 9 (ref 5–15)
Albumin: 3 g/dL — ABNORMAL LOW (ref 3.5–5.0)
BUN: 69 mg/dL — ABNORMAL HIGH (ref 6–20)
CALCIUM: 8.8 mg/dL — AB (ref 8.9–10.3)
CHLORIDE: 96 mmol/L — AB (ref 101–111)
CO2: 33 mmol/L — AB (ref 22–32)
Creatinine, Ser: 2.67 mg/dL — ABNORMAL HIGH (ref 0.44–1.00)
GFR calc Af Amer: 18 mL/min — ABNORMAL LOW (ref >60)
GFR calc non Af Amer: 16 mL/min — ABNORMAL LOW (ref >60)
GLUCOSE: 186 mg/dL — AB (ref 65–99)
Phosphorus: 3.5 mg/dL (ref 2.5–4.6)
Potassium: 4 mmol/L (ref 3.5–5.1)
SODIUM: 138 mmol/L (ref 135–145)

## 2017-03-05 MED ORDER — INSULIN DETEMIR 100 UNIT/ML ~~LOC~~ SOLN
10.0000 [IU] | Freq: Two times a day (BID) | SUBCUTANEOUS | Status: DC
  Administered 2017-03-05 – 2017-03-06 (×3): 10 [IU] via SUBCUTANEOUS
  Filled 2017-03-05 (×4): qty 0.1

## 2017-03-05 MED ORDER — SODIUM CHLORIDE 0.9 % IV SOLN
510.0000 mg | Freq: Once | INTRAVENOUS | Status: AC
  Administered 2017-03-05: 510 mg via INTRAVENOUS
  Filled 2017-03-05: qty 17

## 2017-03-05 MED ORDER — METOLAZONE 2.5 MG PO TABS
2.5000 mg | ORAL_TABLET | Freq: Once | ORAL | Status: AC
  Administered 2017-03-05: 2.5 mg via ORAL
  Filled 2017-03-05: qty 1

## 2017-03-05 NOTE — Progress Notes (Signed)
Physical Therapy Treatment Patient Details Name: Glenda [name] MRN: [id number] DOB: 29-Apr-1933 Today's Date: 03/05/2017    History of Present Illness pt was admitted for hypoxia.  PMH:  HTN, DM, CVAs with right UE residual weakness    PT Comments    Pt able to increase ambulation distance, but still needed chair follow.  O2 96% on 2 L/min.   Follow Up Recommendations  Home health PT     Equipment Recommendations  None recommended by PT    Recommendations for Other Services       Precautions / Restrictions Precautions Precautions: Fall Restrictions Weight Bearing Restrictions: No    Mobility  Bed Mobility                  Transfers Overall transfer level: Needs assistance Equipment used: Rolling walker (2 wheeled) Transfers: Sit to/from Stand Sit to Stand: Min guard;Min assist         General transfer comment: multiple reps with first one needing MIN A and all others min/guard.  R foot blocked to keep it from sliding  Ambulation/Gait Ambulation/Gait assistance: Min guard;Min assist Ambulation Distance (Feet): 34 Feet(plus 16', plus 80') Assistive device: Rolling walker (2 wheeled) Gait Pattern/deviations: Step-through pattern;Decreased step length - right;Trunk flexed Gait velocity: decr   General Gait Details: Difficulty progressing R LE as she fatigued.  Pt took 2 sitting rest breaks. o2 96% on 2 L/min after gait.   Stairs            Wheelchair Mobility    Modified Rankin (Stroke Patients Only)       Balance Overall balance assessment: Needs assistance Sitting-balance support: Feet supported;No upper extremity supported Sitting balance-Leahy Scale: Good     Standing balance support: Bilateral upper extremity supported Standing balance-Leahy Scale: Poor                              Cognition Arousal/Alertness: Awake/alert Behavior During Therapy: WFL for tasks assessed/performed Overall Cognitive Status: Within  Functional Limits for tasks assessed                                        Exercises      General Comments        Pertinent Vitals/Pain Pain Assessment: No/denies pain    Home Living                      Prior Function            PT Goals (current goals can now be found in the care plan section) Acute Rehab PT Goals Patient Stated Goal: walk PT Goal Formulation: With patient Time For Goal Achievement: 03/18/17 Potential to Achieve Goals: Fair Progress towards PT goals: Progressing toward goals    Frequency    Min 3X/week      PT Plan Current plan remains appropriate    Co-evaluation              AM-PAC PT "6 Clicks" Daily Activity  Outcome Measure  Difficulty turning over in bed (including adjusting bedclothes, sheets and blankets)?: A Little Difficulty moving from lying on back to sitting on the side of the bed? : A Little Difficulty sitting down on and standing up from a chair with arms (e.g., wheelchair, bedside commode, etc,.)?: A Little Help needed moving to and from a  bed to chair (including a wheelchair)?: A Little Help needed walking in hospital room?: A Little Help needed climbing 3-5 steps with a railing? : A Lot 6 Click Score: 17    End of Session Equipment Utilized During Treatment: Gait belt;Oxygen Activity Tolerance: Patient tolerated treatment well Patient left: in chair;with call bell/phone within reach;with family/visitor present Nurse Communication: Mobility status PT Visit Diagnosis: Unsteadiness on feet (R26.81);Muscle weakness (generalized) (M62.81);History of falling (Z91.81);Difficulty in walking, not elsewhere classified (R26.2);Hemiplegia and hemiparesis Hemiplegia - Right/Left: Right Hemiplegia - caused by: Cerebral infarction     Time: 5521-7471 PT Time Calculation (min) (ACUTE ONLY): 21 min  Charges:  $Gait Training: 8-22 mins                    G Codes:       [name], [place] Pager  [phone] 03/05/2017    [name] 03/05/2017, 5:02 PM

## 2017-03-05 NOTE — Progress Notes (Signed)
Pt ambulated with PT. She did well. She has been out of bed in chair for my entire shift. Family at bedside around the clock. Pt weaned off o2. Maintain sats of 95-97% after being off o2 x 2 hours

## 2017-03-05 NOTE — Progress Notes (Signed)
Patient ID: Glenda [name], female   DOB: Sep 19, 1933, 83 y.o.   MRN: [id number]  [facility] Progress Note   Assessment/ Plan:   1. Acute hypoxic respiratory failure:  appears to be most likely from CHF exacerbation with element of bronchospasm but no clear evidence of pneumonia. Clinically, she continues to feel better but still requiring oxygen supplementation via nasal cannula-ambulated around the hallway with oxygen supplementation yesterday. I transitioned her over from intravenous furosemide to oral torsemide beginning yesterday evening in an effort to try and get her to arrange that she could be discharged on. Net -0.9 L overnight but paradoxically weight up by 1 kg. 2. Acute kidney injury on chronic kidney disease stage IV: (Baseline creatinine ~2.3) likely hemodynamically mediated acute kidney injury in the face of acute CHF exacerbation with renal hypoperfusion. Continues to maintain decent urine output on current diuretics-now transitioned from intravenous furosemide to oral torsemide 100 mg twice a day. Would recommend metolazone 2.5 mg 3 days a week upon discharge as well. Creatinine/BUN/bicarbonate levels slightly higher today which might reflect mild intravascular volume contraction. 3. Hypertension:  blood pressures improving with ongoing diuretic therapy 4. Anemia of chronic kidney disease:  with significant iron deficiency--now s/p first dose of IV feraheme. 5. Diabetes mellitus: Management per hospitalist service.  Subjective:   Reports to be feeling better, denies chest pain or shortness of breath.     Objective:   BP (!) 146/62   Pulse 65   Temp 97.6 F (36.4 C) (Oral)   Resp 19   Ht 5\' 6"  (1.676 m)   Wt 78.6 kg (173 lb 4.5 oz)   SpO2 95%   BMI 27.97 kg/m   Intake/Output Summary (Last 24 hours) at 03/05/2017 1042 Last data filed at 03/05/2017 0805 Gross per 24 hour  Intake 330 ml  Output 425 ml  Net -95 ml   Weight change: 0.9 kg (1 lb 15.7  oz)  Physical Exam: Gen: Comfortably sitting up in recliner, son at bedside  CVS: Pulse normal rate, regular rhythm, S1 and S2 normal Resp: Fine rales over left base otherwise clear to auscultation, no wheeze/rhonchi Abd: Soft, flat, nontender Ext: Trace edema of the lower extremities (notably improved since admission), 1+ dependent edema  Imaging: No results found.  Labs: BMET Recent Labs  Lab 02/28/17 1054 03/01/17 0627 03/02/17 0350 03/03/17 0331 03/04/17 0348 03/05/17 0532  NA 139 140  140 139 140 137 138  K 3.6 3.3*  3.2* 3.6 3.8 4.9 4.0  CL 100* 102  101 101 102 99* 96*  CO2 25 27  27 28 29 29  33*  GLUCOSE 302* 239*  239* 194* 109* 132* 186*  BUN 62* 63*  63* 61* 61* 61* 69*  CREATININE 2.81* 2.57*  2.52* 2.49* 2.49* 2.51* 2.67*  CALCIUM 8.5* 8.5*  8.4* 8.6* 8.7* 8.7* 8.8*  PHOS  --  4.0 3.5 3.8 4.0 3.5   CBC Recent Labs  Lab 02/28/17 1054 03/02/17 0350  WBC 8.0 5.5  HGB 9.4* 8.6*  HCT 29.9* 27.0*  MCV 87.4 87.4  PLT 185 164   Medications:    . allopurinol  100 mg Oral Daily  . amLODipine  10 mg Oral Daily  . aspirin  81 mg Oral Daily  . carvedilol  12.5 mg Oral BID WC  . cloNIDine  0.2 mg Oral BID  . clopidogrel  75 mg Oral Q breakfast  . famotidine  20 mg Oral Daily  . heparin  5,000 Units Subcutaneous Q8H  .  insulin aspart  0-20 Units Subcutaneous TID WC  . insulin detemir  12 Units Subcutaneous QHS  . isosorbide-hydrALAZINE  2 tablet Oral TID  . potassium chloride  40 mEq Oral Daily  . pravastatin  80 mg Oral Daily  . torsemide  100 mg Oral BID   Elmarie Shiley, MD 03/05/2017, 10:42 AM

## 2017-03-05 NOTE — Progress Notes (Signed)
PROGRESS NOTE  Glenda [name]  NKN:[id number] DOB: 31-Oct-1933 DOA: 02/28/2017 PCP: Patient, No Pcp Per  Brief Narrative: 83 year old female with history of hypertension, type 2 diabetes, multiple a stroke with residual right upper extremity weakness, history of breast cancer status post right lumpectomy, chronic kidney disease is stage IV follows up with the nephrologist outpatient, presented with worsening shortness of breath and nonproductive cough.. Patient required BiPAP in ER. Chest x-ray with cardiomegaly and pulmonary vascular congestion. Started on Lasix drip and nitro drip. Nephrology team is following. Improving, so changed to oral torsemide per nephrology recommendations.   Subjective:  Patient in bed, appears comfortable, denies any headache, no fever, no chest pain or pressure, no shortness of breath , no abdominal pain. No focal weakness.   Assessment & Plan:  Acute respiratory failure with hypoxia in the setting of acute pulmonary edema, 2 acute on chronic diastolic CHF: EF 37% echo 1 month ago.  Currently being diuresed on high dose of IV Lasix along with Zaroxolyn supplementation, nephrology on board, clinically improving less short of breath, still has Rales and some oxygen demand, continue diuresis, increase activity try to titrate off oxygen, continue Coreg along with Imdur and hydralazine.  Outpatient follow-up with cardiology post discharge.  So far 6 L negative.  Troponin elevation: suspect demand ischemia in the setting of CHF. No chest pain.  Seen by cardiology, no further workup or change in management.  On aspirin, beta-blocker and statin for secondary prevention which will be continued.   Acute kidney injury on chronic kidney disease stage IV: Due to CHF, baseline creatinine 2.3, nephrology on board continue to monitor ongoing diuresis, avoid nephrotoxins.  Hypertensive urgency: BP overall improving, none in severe range.  Continue current oral medications:  clonidine, BiDil, Norvasc, Coreg, diuretics.  Continue to monitor.   Anemia of chronic kidney disease/iron deficiency anemia: Received a dose of IV iron. No sign of bleeding.  Monitor CBC.   DM2 - continue sliding scale, Levemir dose adjusted for better control, monitor blood sugar level.    CBG (last 3)  Recent Labs    03/04/17 2145 03/05/17 0829 03/05/17 1139  GLUCAP 301* 198* 209*     DVT prophylaxis: Subcutaneous heparin Code Status: Full Family Communication: Son at bedside Disposition Plan: PT/OT evaluation. Home 1-2 days  Consultants:   Cardiology, [facility]  Nephrology, [facility]  Procedures:    BiPAP on admission  TTE 01-03-17  - Left ventricle: The cavity size was normal. Wall thickness was increased in a pattern of severe LVH. Systolic function was  vigorous. The estimated ejection fraction was in the range of 65% to 70%. Doppler parameters are consistent with a reversible restrictive pattern, indicative of decreased left ventricular diastolic compliance and/or increased left atrial pressure (grade 3 diastolic dysfunction). Doppler parameters are consistent with high ventricular filling pressure. - Mitral valve: Calcified annulus. Mildly thickened leaflets . There was mild to moderate regurgitation. - Left atrium: The atrium was moderately to severely dilated. - Pulmonary arteries: PA peak pressure: 51 mm Hg (S).   Antimicrobials:  None     Objective:  Vitals:   03/04/17 2146 03/05/17 0432 03/05/17 0819 03/05/17 0959  BP: (!) 169/52 (!) 157/55  (!) 146/62  Pulse: 66 65    Resp: 18 19    Temp: 98.3 F (36.8 C) 97.6 F (36.4 C)    TempSrc: Oral Oral    SpO2: 98% 96% 95%   Weight:  78.6 kg (173 lb 4.5 oz)    Height:  Intake/Output Summary (Last 24 hours) at 03/05/2017 1257 Last data filed at 03/05/2017 0805 Gross per 24 hour  Intake 330 ml  Output 425 ml  Net -95 ml   Filed Weights   02/28/17 2044 03/04/17 0500 03/05/17 0432  Weight:  78.9 kg (173 lb 15.1 oz) 77.7 kg (171 lb 4.8 oz) 78.6 kg (173 lb 4.5 oz)    Exam  Awake Alert, Oriented X 3, No new F.N deficits, Normal affect [place].AT,PERRAL Supple Neck,No JVD, No cervical lymphadenopathy appriciated.  Symmetrical Chest wall movement, Good air movement bilaterally,+ve rales RRR,No Gallops, Rubs or new Murmurs, No Parasternal Heave +ve B.Sounds, Abd Soft, No tenderness, No organomegaly appriciated, No rebound - guarding or rigidity. No Cyanosis, Clubbing or edema, No new Rash or bruise   Data Reviewed: I have personally reviewed following labs and imaging studies  CBC: Recent Labs  Lab 02/28/17 1054 03/02/17 0350  WBC 8.0 5.5  HGB 9.4* 8.6*  HCT 29.9* 27.0*  MCV 87.4 87.4  PLT 185 202   Basic Metabolic Panel: Recent Labs  Lab 03/01/17 0627 03/02/17 0350 03/03/17 0331 03/04/17 0348 03/05/17 0532  NA 140  140 139 140 137 138  K 3.3*  3.2* 3.6 3.8 4.9 4.0  CL 102  101 101 102 99* 96*  CO2 27  27 28 29 29  33*  GLUCOSE 239*  239* 194* 109* 132* 186*  BUN 63*  63* 61* 61* 61* 69*  CREATININE 2.57*  2.52* 2.49* 2.49* 2.51* 2.67*  CALCIUM 8.5*  8.4* 8.6* 8.7* 8.7* 8.8*  MG 1.8 1.7 2.3  --   --   PHOS 4.0 3.5 3.8 4.0 3.5   GFR: Estimated Creatinine Clearance: 17.5 mL/min (A) (by C-G formula based on SCr of 2.67 mg/dL (H)). Liver Function Tests: Recent Labs  Lab 02/28/17 1054 03/01/17 0627 03/02/17 0350 03/03/17 0331 03/04/17 0348 03/05/17 0532  AST 20  --   --   --   --   --   ALT 13*  --   --   --   --   --   ALKPHOS 116  --   --   --   --   --   BILITOT 0.6  --   --   --   --   --   PROT 7.4  --   --   --   --   --   ALBUMIN 3.4* 3.1* 3.0* 2.8* 2.9* 3.0*   No results for input(s): LIPASE, AMYLASE in the last 168 hours. No results for input(s): AMMONIA in the last 168 hours. Coagulation Profile: No results for input(s): INR, PROTIME in the last 168 hours. Cardiac Enzymes: Recent Labs  Lab 02/28/17 1054 02/28/17 1706  02/28/17 2308 03/01/17 0627  TROPONINI <0.03 0.37* 0.53* 0.52*   BNP (last 3 results) No results for input(s): PROBNP in the last 8760 hours. HbA1C: No results for input(s): HGBA1C in the last 72 hours. CBG: Recent Labs  Lab 03/04/17 1158 03/04/17 1623 03/04/17 2145 03/05/17 0829 03/05/17 1139  GLUCAP 229* 191* 301* 198* 209*   Lipid Profile: No results for input(s): CHOL, HDL, LDLCALC, TRIG, CHOLHDL, LDLDIRECT in the last 72 hours. Thyroid Function Tests: No results for input(s): TSH, T4TOTAL, FREET4, T3FREE, THYROIDAB in the last 72 hours. Anemia Panel: No results for input(s): VITAMINB12, FOLATE, FERRITIN, TIBC, IRON, RETICCTPCT in the last 72 hours. Urine analysis:    Component Value Date/Time   COLORURINE YELLOW 03/01/2017 0100   APPEARANCEUR CLEAR 03/01/2017 0100  LABSPEC 1.011 03/01/2017 0100   PHURINE 5.0 03/01/2017 0100   GLUCOSEU NEGATIVE 03/01/2017 0100   HGBUR NEGATIVE 03/01/2017 0100   BILIRUBINUR NEGATIVE 03/01/2017 0100   KETONESUR NEGATIVE 03/01/2017 0100   PROTEINUR 100 (A) 03/01/2017 0100   UROBILINOGEN 0.2 07/04/2014 1900   NITRITE NEGATIVE 03/01/2017 0100   LEUKOCYTESUR NEGATIVE 03/01/2017 0100   Recent Results (from the past 240 hour(s))  MRSA PCR Screening     Status: None   Collection Time: 02/28/17  9:39 PM  Result Value Ref Range Status   MRSA by PCR NEGATIVE NEGATIVE Final    Comment:        The GeneXpert MRSA Assay (FDA approved for NASAL specimens only), is one component of a comprehensive MRSA colonization surveillance program. It is not intended to diagnose MRSA infection nor to guide or monitor treatment for MRSA infections.       Radiology Studies: No results found.  Scheduled Meds: . allopurinol  100 mg Oral Daily  . amLODipine  10 mg Oral Daily  . aspirin  81 mg Oral Daily  . carvedilol  12.5 mg Oral BID WC  . cloNIDine  0.2 mg Oral BID  . clopidogrel  75 mg Oral Q breakfast  . famotidine  20 mg Oral Daily  .  heparin  5,000 Units Subcutaneous Q8H  . insulin aspart  0-20 Units Subcutaneous TID WC  . insulin detemir  12 Units Subcutaneous QHS  . isosorbide-hydrALAZINE  2 tablet Oral TID  . metolazone  2.5 mg Oral Once  . potassium chloride  40 mEq Oral Daily  . pravastatin  80 mg Oral Daily  . torsemide  100 mg Oral BID   Continuous Infusions: . ferumoxytol       LOS: 5 days   Time spent: 25 minutes.  Signature  Lala Lund M.D on 03/05/2017 at 12:57 PM  Between 7am to 7pm - Pager - [phone] ( page via [email or web address], text pages only, please mention full 10 digit call back number).  After 7pm go to [email or web address] - password [facility]

## 2017-03-05 NOTE — Plan of Care (Signed)
Pt is able to get up and use BSC.

## 2017-03-06 ENCOUNTER — Inpatient Hospital Stay (HOSPITAL_COMMUNITY): Payer: Medicare (Managed Care)

## 2017-03-06 ENCOUNTER — Encounter (HOSPITAL_COMMUNITY): Payer: Self-pay | Admitting: Student

## 2017-03-06 LAB — RENAL FUNCTION PANEL
ALBUMIN: 2.7 g/dL — AB (ref 3.5–5.0)
ANION GAP: 10 (ref 5–15)
BUN: 67 mg/dL — AB (ref 6–20)
CALCIUM: 8.8 mg/dL — AB (ref 8.9–10.3)
CO2: 31 mmol/L (ref 22–32)
CREATININE: 2.98 mg/dL — AB (ref 0.44–1.00)
Chloride: 96 mmol/L — ABNORMAL LOW (ref 101–111)
GFR calc Af Amer: 16 mL/min — ABNORMAL LOW (ref >60)
GFR calc non Af Amer: 14 mL/min — ABNORMAL LOW (ref >60)
GLUCOSE: 176 mg/dL — AB (ref 65–99)
PHOSPHORUS: 3.4 mg/dL (ref 2.5–4.6)
Potassium: 4 mmol/L (ref 3.5–5.1)
SODIUM: 137 mmol/L (ref 135–145)

## 2017-03-06 LAB — GLUCOSE, CAPILLARY
Glucose-Capillary: 124 mg/dL — ABNORMAL HIGH (ref 65–99)
Glucose-Capillary: 239 mg/dL — ABNORMAL HIGH (ref 65–99)
Glucose-Capillary: 282 mg/dL — ABNORMAL HIGH (ref 65–99)

## 2017-03-06 MED ORDER — POTASSIUM CHLORIDE CRYS ER 20 MEQ PO TBCR: 40.0000 meq | EXTENDED_RELEASE_TABLET | Freq: Every day | ORAL | 0 refills | Status: DC

## 2017-03-06 MED ORDER — HYDRALAZINE HCL 25 MG PO TABS: 25.0000 mg | ORAL_TABLET | Freq: Three times a day (TID) | ORAL | 0 refills | Status: DC

## 2017-03-06 MED ORDER — ISOSORBIDE MONONITRATE ER 30 MG PO TB24: 30.0000 mg | ORAL_TABLET | Freq: Every day | ORAL | 0 refills | Status: DC

## 2017-03-06 MED ORDER — TORSEMIDE 100 MG PO TABS: 100.0000 mg | ORAL_TABLET | Freq: Two times a day (BID) | ORAL | 0 refills | Status: DC

## 2017-03-06 MED ORDER — HYDRALAZINE HCL 25 MG PO TABS
25.0000 mg | ORAL_TABLET | Freq: Three times a day (TID) | ORAL | Status: DC
  Administered 2017-03-06: 25 mg via ORAL
  Filled 2017-03-06: qty 1

## 2017-03-06 NOTE — Progress Notes (Signed)
sats on RA at HS was 89%  Placed on 2l/m [place] for sleep.  No SOB noted and only trace edema in BLE.  LS decreased.

## 2017-03-06 NOTE — Care Management Note (Signed)
Case Management Note  Patient Details  Name: Glenda [name] MRN: [id number] Date of Birth: [birth year]  Subjective/Objective:   Acute resp failure, AKI, HTN                 Action/Plan: Discharge Planning: NCM spoke to pt and dtr, [name] at bedside. Pt is with [facility] and they are listed as payor. Attempted to arrange [facility]. List provided to dtr. Will contact [facility] on 11/12 to arrange [facility]. Contacted [facility] for 3n1 bedside commode. Pt has wheelchair and RW at home.    Expected Discharge Date:  03/06/17               Expected Discharge Plan:  [place]  In-House Referral:  NA  Discharge planning Services  CM Consult  Post Acute Care Choice:  Home Health Choice offered to:  Adult Children  DME Arranged:  3-N-1 DME Agency:  [facility] Arranged:  RN, PT, Nurse's Aide [place] Agency:     Status of Service:  In process, will continue to follow  If discussed at Long Length of Stay Meetings, dates discussed:    Additional Comments:  Erenest Rasher, RN 03/06/2017, 3:49 PM

## 2017-03-06 NOTE — Progress Notes (Signed)
Inpatient Diabetes Program Recommendations  AACE/ADA: New Consensus Statement on Inpatient Glycemic Control (2015)  Target Ranges:  Prepandial:   less than 140 mg/dL      Peak postprandial:   less than 180 mg/dL (1-2 hours)      Critically ill patients:  140 - 180 mg/dL   Lab Results  Component Value Date   GLUCAP 239 (H) 03/06/2017   HGBA1C 9.6 (H) 03/01/2017    Review of Glycemic Control  Diabetes history: DM2 Outpatient Diabetes medications: Humalog 50/50 10 units bid Current orders for Inpatient glycemic control: Levemir 10 units bid, Novolog 0-20 units tidwc  HS correction needed. HS blood sugar 301, 250 x past 2 nights. Pt receives 5 units rapid-acting insulin at Breakfast and Dinner PTA.  Inpatient Diabetes Program Recommendations:   Add HS correction. Add meal coverage insulin. Novolog 3 units tidwc if pt eats > 50% meal  Continue to follow.  Thank you. Lorenda Peck, RD, LDN, CDE Inpatient Diabetes Coordinator [phone]

## 2017-03-06 NOTE — Progress Notes (Signed)
Patient ID: Glenda [name], female   DOB: 20-Feb-1934, 83 y.o.   MRN: [id number]  [facility] Progress Note   Assessment/ Plan:   1. Acute hypoxic respiratory failure:  appears to be most likely from CHF exacerbation with no clear evidence of pneumonia. She continues to show good clinical improvement with ongoing diuretic therapy. Switched from intravenous furosemide to oral torsemide 100 mg twice a day and recommend metolazone 2.5 mg 3 days a week. 2. Acute kidney injury on chronic kidney disease stage IV: (Baseline creatinine ~2.3) likely hemodynamically mediated acute kidney injury in the face of acute CHF exacerbation with renal hypoperfusion. Urine output charted at 600 mL with weight gain of 0.8 kg on torsemide 100 mg twice a day. Slight rise of creatinine noted-agree with plans for renal ultrasound and then discharged thereafter if no acute lesions seen. We'll set her up for labs mid next week. 3. Hypertension:  blood pressures remains elevated on current therapy-start hydralazine 25 mg 3 times a day 4. Anemia of chronic kidney disease:  with significant iron deficiency--status post intravenous iron (Feraheme 1020 mg). 5. Diabetes mellitus: Management per hospitalist service.  Subjective:   Reports to be feeling well/better and looks for 2 going home today.     Objective:   BP (!) 186/46   Pulse 62   Temp 99.1 F (37.3 C) (Oral)   Resp 18   Ht 5\' 6"  (1.676 m)   Wt 79.4 kg (175 lb 0.7 oz)   SpO2 92%   BMI 28.25 kg/m   Intake/Output Summary (Last 24 hours) at 03/06/2017 1037 Last data filed at 03/06/2017 0803 Gross per 24 hour  Intake 700 ml  Output 600 ml  Net 100 ml   Weight change: 0.8 kg (1 lb 12.2 oz)  Physical Exam: Gen: Comfortably sitting up in recliner,  at bedside  CVS: Pulse normal rate, regular rhythm, S1 and S2 normal Resp: Poor inspiratory effort but clear to auscultation bilaterally without any rales, no wheeze/rhonchi Abd: Soft, flat,  nontender Ext: Trace edema of the lower extremities (notably improved since admission)  Imaging: Dg Chest Port 1 View  Result Date: 03/06/2017 CLINICAL DATA:  Shortness of Breath EXAM: PORTABLE CHEST 1 VIEW COMPARISON:  March 02, 2017 FINDINGS: There is focal airspace consolidation in the left base. There is atelectatic change in the right base. The lungs elsewhere clear. Heart is upper normal in size with pulmonary vascularity within normal limits. There is aortic atherosclerosis. No adenopathy. No evident bone lesions. There are surgical clips in the right breast region. IMPRESSION: Airspace consolidation consistent with pneumonia left base. Mild right base atelectasis. No new opacity. Lungs elsewhere clear. Stable cardiac silhouette. There is aortic atherosclerosis. Aortic Atherosclerosis (ICD10-I70.0). Followup PA and lateral chest radiographs recommended in 3-4 weeks following trial of antibiotic therapy to ensure resolution and exclude underlying malignancy. Electronically Signed   By: Lowella Grip III M.D.   On: 03/06/2017 08:13    Labs: BMET Recent Labs  Lab 02/28/17 1054 03/01/17 0627 03/02/17 0350 03/03/17 0331 03/04/17 0348 03/05/17 0532 03/06/17 0516  NA 139 140  140 139 140 137 138 137  K 3.6 3.3*  3.2* 3.6 3.8 4.9 4.0 4.0  CL 100* 102  101 101 102 99* 96* 96*  CO2 25 27  27 28 29 29  33* 31  GLUCOSE 302* 239*  239* 194* 109* 132* 186* 176*  BUN 62* 63*  63* 61* 61* 61* 69* 67*  CREATININE 2.81* 2.57*  2.52* 2.49* 2.49*  2.51* 2.67* 2.98*  CALCIUM 8.5* 8.5*  8.4* 8.6* 8.7* 8.7* 8.8* 8.8*  PHOS  --  4.0 3.5 3.8 4.0 3.5 3.4   CBC Recent Labs  Lab 02/28/17 1054 03/02/17 0350  WBC 8.0 5.5  HGB 9.4* 8.6*  HCT 29.9* 27.0*  MCV 87.4 87.4  PLT 185 164   Medications:    . allopurinol  100 mg Oral Daily  . amLODipine  10 mg Oral Daily  . aspirin  81 mg Oral Daily  . carvedilol  12.5 mg Oral BID WC  . cloNIDine  0.2 mg Oral BID  . clopidogrel  75 mg  Oral Q breakfast  . famotidine  20 mg Oral Daily  . heparin  5,000 Units Subcutaneous Q8H  . insulin aspart  0-20 Units Subcutaneous TID WC  . insulin detemir  10 Units Subcutaneous BID  . isosorbide-hydrALAZINE  2 tablet Oral TID  . potassium chloride  40 mEq Oral Daily  . pravastatin  80 mg Oral Daily  . torsemide  100 mg Oral BID   Elmarie Shiley, MD 03/06/2017, 10:37 AM

## 2017-03-06 NOTE — Discharge Summary (Signed)
Glenda [name] EXN:[id number] DOB: [birth year] DOA: 02/28/2017  PCP: Patient, No Pcp Per  Admit date: 02/28/2017  Discharge date: 03/06/2017  Admitted From: Home   Disposition:  Home   Recommendations for Outpatient Follow-up:   Follow up with PCP in 1-2 weeks  PCP Please obtain BMP/CBC, 2 view CXR in 1week,  (see Discharge instructions)   PCP Please follow up on the following pending results: Monitor BMP closely   Home Health: PT, RN  Equipment/Devices: None Consultations: Renal Discharge Condition: Stable   CODE STATUS: Full   Diet Recommendation: Diet renal/carb modified with fluid restriction 1.5lit/day   Chief Complaint  Patient presents with  . Shortness of Breath  . Wheezing     Brief history of present illness from the day of admission and additional interim summary     83 year old female with history of hypertension, type 2 diabetes, multiple a stroke with residual right upper extremity weakness, history of breast cancer status post right lumpectomy, chronic kidney disease is stage IV follows up with the nephrologist outpatient, presented with worsening shortness of breath and nonproductive cough.. Patient required BiPAP in ER. Chest x-ray with cardiomegaly and pulmonary vascular congestion. Started on Lasix drip and nitro drip. Nephrology team is following. Improving, so changed to oral torsemide per nephrology recommendations.                                                                   Hospital Course    Acute respiratory failure with hypoxia in the setting of acute pulmonary edema, 2 acute on chronic diastolic CHF: EF 49% echo 1 month ago.  Was diuresed with high dose of IV Lasix along with Zaroxolyn supplementation, nephrology was on board, clinically improved no shortness of breath, no  rales or oxygen demand, continue Coreg along with Imdur and hydralazine.  Discussed with nephrologist on call Dr. Posey Pronto today, she may be placed on Demadex 100 mg twice a day along with potassium supplementation and discharged home, she will follow with PCP and nephrology closely, request PCP to monitor BMP closely over the next 1 month and adjust diuretic dose as needed.  Renal ultrasound was nonacute.   Troponin elevation: suspect demand ischemia in the setting of CHF. No chest pain.  Seen by cardiology, no further workup or change in management.  On aspirin, beta-blocker and statin for secondary prevention which will be continued.    Acute kidney injury on chronic kidney disease stage IV: Due to CHF, baseline creatinine 2.3, nephrology on board continue to monitor BMP outpatient with ongoing diuresis, avoid nephrotoxins.   Hypertensive urgency:BP overall stable on below mentioned regimen, PCP to monitor.    Anemia of chronic kidney disease/iron deficiency anemia: Received a dose of IV iron. No sign of bleeding.  Monitor CBC outpt.  DM2 - continue home Rx.      Discharge diagnosis     Active Problems:   Hypoxia   Acute renal failure superimposed on chronic kidney disease (HCC)   Acute respiratory failure with hypoxia (HCC)   Hypokalemia    Discharge instructions    Discharge Instructions    Discharge instructions   Complete by:  As directed    Follow with Primary MD in 7 days   Get CBC, CMP, 2 view Chest X ray checked  by Primary MD in 5-7 days   Activity: As tolerated with Full fall precautions use walker/cane & assistance as needed  Disposition Home   Diet:   Diet renal/carb modified with fluid restriction 1.5lits/ day  Accuchecks 4 times/day, Once in AM empty stomach and then before each meal. Log in all results and show them to your Prim.MD in 3 days. If any glucose reading is under 80 or above 300 call your Prim MD immidiately. Follow Low glucose  instructions for glucose under 80 as instructed.   For Heart failure patients - Check your Weight same time everyday, if you gain over 2 pounds, or you develop in leg swelling, experience more shortness of breath or chest pain, call your Primary MD immediately. Follow Cardiac Low Salt Diet and 1.5 lit/day fluid restriction.  On your next visit with your primary care physician please Get Medicines reviewed and adjusted.  Please request your Prim.MD to go over all Hospital Tests and Procedure/Radiological results at the follow up, please get all Hospital records sent to your Prim MD by signing hospital release before you go home.  If you experience worsening of your admission symptoms, develop shortness of breath, life threatening emergency, suicidal or homicidal thoughts you must seek medical attention immediately by calling 911 or calling your MD immediately  if symptoms less severe.  You Must read complete instructions/literature along with all the possible adverse reactions/side effects for all the Medicines you take and that have been prescribed to you. Take any new Medicines after you have completely understood and accpet all the possible adverse reactions/side effects.   Do not drive, operate heavy machinery, perform activities at heights, swimming or participation in water activities or provide baby sitting services if your were admitted for syncope or siezures until you have seen by Primary MD or a Neurologist and advised to do so again.  Do not drive when taking Pain medications.    Do not take more than prescribed Pain, Sleep and Anxiety Medications  Special Instructions: If you have smoked or chewed Tobacco  in the last 2 yrs please stop smoking, stop any regular Alcohol  and or any Recreational drug use.  Wear Seat belts while driving.   Please note  You were cared for by a hospitalist during your hospital stay. If you have any questions about your discharge medications or the  care you received while you were in the hospital after you are discharged, you can call the unit and asked to speak with the hospitalist on call if the hospitalist that took care of you is not available. Once you are discharged, your primary care physician will handle any further medical issues. Please note that NO REFILLS for any discharge medications will be authorized once you are discharged, as it is imperative that you return to your primary care physician (or establish a relationship with a primary care physician if you do not have one) for your aftercare needs so that they can reassess your need for  medications and monitor your lab values.   Increase activity slowly   Complete by:  As directed       Discharge Medications   Allergies as of 03/06/2017   No Known Allergies     Medication List    STOP taking these medications   furosemide 80 MG tablet Commonly known as:  LASIX   isosorbide-hydrALAZINE 20-37.5 MG tablet Commonly known as:  BIDIL     TAKE these medications   amLODipine 10 MG tablet Commonly known as:  NORVASC Take 10 mg by mouth daily. Reported on 10/31/2015   aspirin 81 MG chewable tablet Chew 81 mg by mouth daily.   carvedilol 12.5 MG tablet Commonly known as:  COREG Take 1 tablet (12.5 mg total) by mouth 2 (two) times daily with a meal.   cetirizine 10 MG tablet Commonly known as:  ZYRTEC Take 10 mg daily by mouth.   citalopram 20 MG tablet Commonly known as:  CELEXA Take 20 mg by mouth daily.   cloNIDine 0.2 MG tablet Commonly known as:  CATAPRES Take 0.2 mg by mouth 2 (two) times daily.   clopidogrel 75 MG tablet Commonly known as:  PLAVIX Take 75 mg by mouth daily.   [facility] RANITIDINE 75 MG tablet Generic drug:  ranitidine Take 75 mg 2 (two) times daily by mouth.   feeding supplement (NEPRO CARB STEADY) Liqd Take 237 mLs by mouth 2 (two) times daily between meals.   HUMALOG MIX 50/50 KWIKPEN (50-50) 100 UNIT/ML Kwikpen Generic drug:   Insulin Lispro Prot & Lispro Inject 16 Units into the skin. Before breakfast and 16 units before supper per Dr. Buddy Duty. Don's give if not eat meal   hydrALAZINE 25 MG tablet Commonly known as:  APRESOLINE Take 1 tablet (25 mg total) every 8 (eight) hours by mouth.   insulin detemir 100 UNIT/ML injection Commonly known as:  LEVEMIR Inject 0.12 mLs (12 Units total) into the skin at bedtime.   isosorbide mononitrate 30 MG 24 hr tablet Commonly known as:  IMDUR Take 1 tablet (30 mg total) daily by mouth.   PEPCID AC PO Take 2 tablets at bedtime as needed by mouth (ACID REFLUX).   potassium chloride SA 20 MEQ tablet Commonly known as:  K-DUR,KLOR-CON Take 2 tablets (40 mEq total) daily by mouth. Start taking on:  03/07/2017   pravastatin 80 MG tablet Commonly known as:  PRAVACHOL Take 80 mg by mouth daily.   torsemide 100 MG tablet Commonly known as:  DEMADEX Take 1 tablet (100 mg total) 2 (two) times daily by mouth.            Durable Medical Equipment  (From admission, onward)        Start     Ordered   03/06/17 1548  For home use only DME 3 n 1  Once     03/06/17 1547      Follow-up Information    Dixie Dials, MD. Schedule an appointment as soon as possible for a visit in 1 week(s).   Specialty:  Cardiology Contact information: [place] [address] [phone]        Elmarie Shiley, MD. Schedule an appointment as soon as possible for a visit in 1 week(s).   Specialty:  Nephrology Contact information: [place] [address] [phone]           Major procedures and Radiology Reports - PLEASE review detailed and final reports thoroughly  -  US Renal  Result Date: 03/06/2017 CLINICAL DATA:  Acute on chronic renal failure EXAM: RENAL / URINARY TRACT ULTRASOUND COMPLETE COMPARISON:  01/03/2017 renal sonogram FINDINGS: Right Kidney: Length: 11.2 cm . Echogenic right kidney. Normal right renal parenchymal  thickness. No right hydronephrosis. No right renal mass. Left Kidney: Length: 10.6 cm. Echogenic left kidney. Normal left renal parenchymal thickness. No left hydronephrosis. No left renal mass. Bladder: Appears normal for degree of bladder distention. IMPRESSION: 1. No hydronephrosis. 2. Echogenic normal size kidneys, compatible with nonspecific renal parenchymal disease of uncertain chronicity. 3. Normal bladder . Electronically Signed   By: Ilona Sorrel M.D.   On: 03/06/2017 16:53   Dg Chest Port 1 View  Result Date: 03/06/2017 CLINICAL DATA:  Shortness of Breath EXAM: PORTABLE CHEST 1 VIEW COMPARISON:  March 02, 2017 FINDINGS: There is focal airspace consolidation in the left base. There is atelectatic change in the right base. The lungs elsewhere clear. Heart is upper normal in size with pulmonary vascularity within normal limits. There is aortic atherosclerosis. No adenopathy. No evident bone lesions. There are surgical clips in the right breast region. IMPRESSION: Airspace consolidation consistent with pneumonia left base. Mild right base atelectasis. No new opacity. Lungs elsewhere clear. Stable cardiac silhouette. There is aortic atherosclerosis. Aortic Atherosclerosis (ICD10-I70.0). Followup PA and lateral chest radiographs recommended in 3-4 weeks following trial of antibiotic therapy to ensure resolution and exclude underlying malignancy. Electronically Signed   By: Lowella Grip III M.D.   On: 03/06/2017 08:13   Dg Chest Port 1 View  Result Date: 03/02/2017 CLINICAL DATA:  Shortness of Breath EXAM: PORTABLE CHEST 1 VIEW COMPARISON:  February 28, 2017 FINDINGS: There is persistent cardiomegaly with mild pulmonary venous hypertension. There is airspace consolidation throughout the left lower lobe with more patchy infiltrate in the right lower lobe. There is aortic atherosclerosis. No evident adenopathy. No bone lesions. IMPRESSION: Persistent pulmonary vascular congestion. Airspace opacity  bilaterally, consistent with either pneumonia or pulmonary edema. Both entities may exist concurrently. There is somewhat more consolidation in the left lower lobe and right lower lobe. The appearance overall is similar compared to 1 day prior. There is aortic atherosclerosis. Aortic Atherosclerosis (ICD10-I70.0). Electronically Signed   By: Lowella Grip III M.D.   On: 03/02/2017 07:20   Dg Chest Port 1 View  Result Date: 02/28/2017 CLINICAL DATA:  Shortness of breath.  Coronary artery disease. EXAM: PORTABLE CHEST 1 VIEW COMPARISON:  01/02/2017. FINDINGS: The heart is enlarged. There is calcification of the thoracic aorta. Low lung volumes. BILATERAL pulmonary opacities could represent edema or pneumonia. Similar appearance to priors except for slightly improved aeration. IMPRESSION: Cardiomegaly with BILATERAL pulmonary opacities representing either edema or pneumonia. Similar appearance priors. Electronically Signed   By: Staci Righter M.D.   On: 02/28/2017 11:42    Micro Results     Recent Results (from the past 240 hour(s))  MRSA PCR Screening     Status: None   Collection Time: 02/28/17  9:39 PM  Result Value Ref Range Status   MRSA by PCR NEGATIVE NEGATIVE Final    Comment:        The GeneXpert MRSA Assay (FDA approved for NASAL specimens only), is one component of a comprehensive MRSA colonization surveillance program. It is not intended to diagnose MRSA infection nor to guide or monitor treatment for MRSA infections.     Today   Subjective    Glenda [name] today has no headache,no chest abdominal pain,no new weakness tingling or numbness,  feels much better wants to go home today.     Objective   Blood pressure (!) 156/44, pulse 65, temperature 98.9 F (37.2 C), temperature source Oral, resp. rate 18, height 5\' 6"  (1.676 m), weight 79.4 kg (175 lb 0.7 oz), SpO2 95 %.   Intake/Output Summary (Last 24 hours) at 03/06/2017 1748 Last data filed at 03/06/2017  1422 Gross per 24 hour  Intake 710 ml  Output 600 ml  Net 110 ml    Exam Awake Alert, Oriented x 3, No new F.N deficits, Normal affect [place].AT,PERRAL Supple Neck,No JVD, No cervical lymphadenopathy appriciated.  Symmetrical Chest wall movement, Good air movement bilaterally, CTAB RRR,No Gallops,Rubs or new Murmurs, No Parasternal Heave +ve B.Sounds, Abd Soft, Non tender, No organomegaly appriciated, No rebound -guarding or rigidity. No Cyanosis, Clubbing or edema, No new Rash or bruise   Data Review   CBC w Diff:  Lab Results  Component Value Date   WBC 5.5 03/02/2017   HGB 8.6 (L) 03/02/2017   HGB 11.0 (L) 05/07/2015   HCT 27.0 (L) 03/02/2017   HCT 34.0 (L) 05/07/2015   PLT 164 03/02/2017   PLT 207 05/07/2015   LYMPHOPCT 12 01/02/2017   LYMPHOPCT 33.0 05/07/2015   MONOPCT 8 01/02/2017   MONOPCT 9.8 05/07/2015   EOSPCT 2 01/02/2017   EOSPCT 0.8 05/07/2015   BASOPCT 0 01/02/2017   BASOPCT 0.4 05/07/2015    CMP:  Lab Results  Component Value Date   NA 137 03/06/2017   NA 140 09/16/2015   NA 141 05/07/2015   K 4.0 03/06/2017   K 4.3 05/07/2015   CL 96 (L) 03/06/2017   CO2 31 03/06/2017   CO2 22 05/07/2015   BUN 67 (H) 03/06/2017   BUN 34 (A) 09/16/2015   BUN 22.5 05/07/2015   CREATININE 2.98 (H) 03/06/2017   CREATININE 1.2 (H) 05/07/2015   GLU 231 09/16/2015   PROT 7.4 02/28/2017   PROT 7.1 05/07/2015   ALBUMIN 2.7 (L) 03/06/2017   ALBUMIN 3.3 (L) 05/07/2015   BILITOT 0.6 02/28/2017   BILITOT 0.30 05/07/2015   ALKPHOS 116 02/28/2017   ALKPHOS 95 05/07/2015   AST 20 02/28/2017   AST 15 05/07/2015   ALT 13 (L) 02/28/2017   ALT <9 05/07/2015  .   Total Time in preparing paper work, data evaluation and todays exam - 68 minutes  Lala Lund M.D on 03/06/2017 at 5:48 PM  [facility]   Office  [phone]

## 2017-03-06 NOTE — Discharge Instructions (Signed)
Follow with Primary MD in 7 days   Get CBC, CMP, 2 view Chest X ray checked  by Primary MD in 5-7 days   Activity: As tolerated with Full fall precautions use walker/cane & assistance as needed  Disposition Home   Diet:   Diet renal/carb modified with fluid restriction 1.5lits/ day  Accuchecks 4 times/day, Once in AM empty stomach and then before each meal. Log in all results and show them to your Prim.MD in 3 days. If any glucose reading is under 80 or above 300 call your Prim MD immidiately. Follow Low glucose instructions for glucose under 80 as instructed.  For Heart failure patients - Check your Weight same time everyday, if you gain over 2 pounds, or you develop in leg swelling, experience more shortness of breath or chest pain, call your Primary MD immediately. Follow Cardiac Low Salt Diet and 1.5 lit/day fluid restriction.  On your next visit with your primary care physician please Get Medicines reviewed and adjusted.  Please request your Prim.MD to go over all Hospital Tests and Procedure/Radiological results at the follow up, please get all Hospital records sent to your Prim MD by signing hospital release before you go home.  If you experience worsening of your admission symptoms, develop shortness of breath, life threatening emergency, suicidal or homicidal thoughts you must seek medical attention immediately by calling 911 or calling your MD immediately  if symptoms less severe.  You Must read complete instructions/literature along with all the possible adverse reactions/side effects for all the Medicines you take and that have been prescribed to you. Take any new Medicines after you have completely understood and accpet all the possible adverse reactions/side effects.   Do not drive, operate heavy machinery, perform activities at heights, swimming or participation in water activities or provide baby sitting services if your were admitted for syncope or siezures until you have  seen by Primary MD or a Neurologist and advised to do so again.  Do not drive when taking Pain medications.    Do not take more than prescribed Pain, Sleep and Anxiety Medications  Special Instructions: If you have smoked or chewed Tobacco  in the last 2 yrs please stop smoking, stop any regular Alcohol  and or any Recreational drug use.  Wear Seat belts while driving.   Please note  You were cared for by a hospitalist during your hospital stay. If you have any questions about your discharge medications or the care you received while you were in the hospital after you are discharged, you can call the unit and asked to speak with the hospitalist on call if the hospitalist that took care of you is not available. Once you are discharged, your primary care physician will handle any further medical issues. Please note that NO REFILLS for any discharge medications will be authorized once you are discharged, as it is imperative that you return to your primary care physician (or establish a relationship with a primary care physician if you do not have one) for your aftercare needs so that they can reassess your need for medications and monitor your lab values.

## 2017-03-06 NOTE — Progress Notes (Signed)
Patient had an uneventful day. Patients oxygen was kept at 93 during my shift. Patient stayed in recliner entire shift with daughter at bedside with her. Will continue to monitor.

## 2017-03-07 NOTE — Progress Notes (Signed)
Contacted [facility]. Requested NCM provide contact number and states rep will give call back. Jonnie Finner RN CCM Case Mgmt phone [phone]

## 2017-03-25 ENCOUNTER — Ambulatory Visit: Payer: Medicare (Managed Care) | Admitting: Cardiology

## 2017-04-12 ENCOUNTER — Ambulatory Visit
Admission: RE | Admit: 2017-04-12 | Discharge: 2017-04-12 | Disposition: A | Discharge status: Home / Self Care | Payer: Medicaid Other | Source: Ambulatory Visit | Attending: Internal Medicine | Admitting: Internal Medicine

## 2017-04-12 DIAGNOSIS — Z9889 Other specified postprocedural states: Secondary | ICD-10-CM

## 2017-05-10 ENCOUNTER — Other Ambulatory Visit: Payer: Self-pay

## 2017-05-10 DIAGNOSIS — Z01812 Encounter for preprocedural laboratory examination: Secondary | ICD-10-CM

## 2017-05-10 DIAGNOSIS — N184 Chronic kidney disease, stage 4 (severe): Secondary | ICD-10-CM

## 2017-06-13 ENCOUNTER — Ambulatory Visit (INDEPENDENT_AMBULATORY_CARE_PROVIDER_SITE_OTHER): Payer: Medicare (Managed Care) | Admitting: Surgery

## 2017-06-13 ENCOUNTER — Other Ambulatory Visit: Payer: Self-pay

## 2017-06-13 ENCOUNTER — Ambulatory Visit (HOSPITAL_COMMUNITY)
Admission: RE | Admit: 2017-06-13 | Discharge: 2017-06-13 | Disposition: A | Discharge status: Home / Self Care | Payer: Medicare (Managed Care) | Source: Ambulatory Visit | Attending: Surgery | Admitting: Surgery

## 2017-06-13 ENCOUNTER — Encounter: Payer: Self-pay | Admitting: Surgery

## 2017-06-13 ENCOUNTER — Ambulatory Visit (INDEPENDENT_AMBULATORY_CARE_PROVIDER_SITE_OTHER)
Admission: RE | Admit: 2017-06-13 | Discharge: 2017-06-13 | Disposition: A | Discharge status: Home / Self Care | Payer: Medicare (Managed Care) | Source: Ambulatory Visit | Attending: Surgery | Admitting: Surgery

## 2017-06-13 VITALS — BP 187/74 | HR 64 | Temp 98.0°F | Resp 20 | Ht 66.0 in | Wt 170.0 lb

## 2017-06-13 DIAGNOSIS — N184 Chronic kidney disease, stage 4 (severe): Secondary | ICD-10-CM | POA: Diagnosis not present

## 2017-06-13 DIAGNOSIS — Z01818 Encounter for other preprocedural examination: Secondary | ICD-10-CM | POA: Diagnosis not present

## 2017-06-13 DIAGNOSIS — Z01812 Encounter for preprocedural laboratory examination: Secondary | ICD-10-CM

## 2017-06-13 DIAGNOSIS — C50911 Malignant neoplasm of unspecified site of right female breast: Secondary | ICD-10-CM | POA: Insufficient documentation

## 2017-06-13 DIAGNOSIS — N185 Chronic kidney disease, stage 5: Secondary | ICD-10-CM | POA: Diagnosis not present

## 2017-06-13 NOTE — Progress Notes (Signed)
[facility] [place]  Patient name: Glenda [name] MRN: [id number] DOB: January 26, 1934 Sex: female   REQUESTING PROVIDER:    Dr. Hollie Salk   REASON FOR CONSULT:    ESRD  HISTORY OF PRESENT ILLNESS:   Glenda [name] is a 84 y.o. female, who is referred today for evaluation of dialysis access.  Her renal failure secondary to diabetes and hypertension.  She also suffers from chronic diastolic congestive heart failure.  She takes a statin for hypercholesterolemia.  She has a history of stroke.  She is on dual antiplatelet therapy.  She has a history of breast cancer with axillary node intervention  PAST MEDICAL HISTORY    Past Medical History:  Diagnosis Date  . Arthritis   . Breast cancer ([place])   . CAD (coronary artery disease)   . Chest pain   . Dizziness   . DM (diabetes mellitus) ([place])   . GERD (gastroesophageal reflux disease)   . HLD (hyperlipidemia)   . HTN (hypertension)   . Obesity   . Radiation 09/11/15-10/13/15   42.72 Gy to right breast, 12 Gy boost to right breast  . Shortness of breath dyspnea   . Stroke ([place])   . UTI (urinary tract infection)      FAMILY HISTORY   Family History  Problem Relation Age of Onset  . Diabetes Mother   . Colon cancer Father        also had lung  . Diabetes Unknown   . Heart attack Unknown   . Diabetes Sister        [name]  . Breast cancer Sister     SOCIAL HISTORY:   Social History   Socioeconomic History  . Marital status: Widowed    Spouse name: Not on file  . Number of children: 5  . Years of education: 34  . Highest education level: Not on file  Social Needs  . Financial resource strain: Not on file  . Food insecurity - worry: Not on file  . Food insecurity - inability: Not on file  . Transportation needs - medical: Not on file  . Transportation needs - non-medical: Not on file  Occupational History  . Occupation: Retired  Tobacco Use  . Smoking status:  Never Smoker  . Smokeless tobacco: Never Used  Substance and Sexual Activity  . Alcohol use: No    Alcohol/week: 0.0 oz  . Drug use: No  . Sexual activity: Not Currently  Other Topics Concern  . Not on file  Social History Narrative   Retired.    Lives with daughter   Caffeine use: Drinks soda and coffee and tea daily    ALLERGIES:    No Known Allergies  CURRENT MEDICATIONS:    Current Outpatient Medications  Medication Sig Dispense Refill  . amLODipine (NORVASC) 10 MG tablet Take 10 mg by mouth daily. Reported on 10/31/2015    . aspirin 81 MG chewable tablet Chew 81 mg by mouth daily.    . carvedilol (COREG) 12.5 MG tablet Take 1 tablet (12.5 mg total) by mouth 2 (two) times daily with a meal. 60 tablet 0  . cetirizine (ZYRTEC) 10 MG tablet Take 10 mg daily by mouth.    . citalopram (CELEXA) 20 MG tablet Take 20 mg by mouth daily.    . cloNIDine (CATAPRES) 0.2 MG tablet Take 0.2 mg by mouth 2 (two) times daily.     . clopidogrel (PLAVIX) 75 MG tablet Take 75 mg by mouth daily.     Marland Kitchen  [facility] RANITIDINE 75 MG tablet Take 75 mg 2 (two) times daily by mouth.  12  . Famotidine (PEPCID AC PO) Take 2 tablets at bedtime as needed by mouth (ACID REFLUX).    Marland Kitchen HUMALOG MIX 50/50 KWIKPEN (50-50) 100 UNIT/ML Kwikpen Inject 16 Units into the skin. Before breakfast and 16 units before supper per Dr. Buddy Duty. Don's give if not eat meal  11  . hydrALAZINE (APRESOLINE) 25 MG tablet Take 1 tablet (25 mg total) every 8 (eight) hours by mouth. 90 tablet 0  . insulin detemir (LEVEMIR) 100 UNIT/ML injection Inject 0.12 mLs (12 Units total) into the skin at bedtime. 10 mL 11  . Nutritional Supplements (FEEDING SUPPLEMENT, NEPRO CARB STEADY,) LIQD Take 237 mLs by mouth 2 (two) times daily between meals. 30 Can 0  . potassium chloride SA (K-DUR,KLOR-CON) 20 MEQ tablet Take 2 tablets (40 mEq total) daily by mouth. 30 tablet 0  . pravastatin (PRAVACHOL) 80 MG tablet Take 80 mg by mouth daily.    Marland Kitchen torsemide  (DEMADEX) 100 MG tablet Take 1 tablet (100 mg total) 2 (two) times daily by mouth. 60 tablet 0  . isosorbide mononitrate (IMDUR) 30 MG 24 hr tablet Take 1 tablet (30 mg total) daily by mouth. 30 tablet 0   No current facility-administered medications for this visit.     REVIEW OF SYSTEMS:   [X]  denotes positive finding, [ ]  denotes negative finding Cardiac  Comments:  Chest pain or chest pressure:    Shortness of breath upon exertion:    Short of breath when lying flat:    Irregular heart rhythm:        Vascular    Pain in calf, thigh, or hip brought on by ambulation:    Pain in feet at night that wakes you up from your sleep:     Blood clot in your veins:    Leg swelling:  x       Pulmonary    Oxygen at home:    Productive cough:     Wheezing:  x       Neurologic    Sudden weakness in arms or legs:     Sudden numbness in arms or legs:     Sudden onset of difficulty speaking or slurred speech:    Temporary loss of vision in one eye:     Problems with dizziness:         Gastrointestinal    Blood in stool:      Vomited blood:         Genitourinary    Burning when urinating:     Blood in urine:        Psychiatric    Major depression:         Hematologic    Bleeding problems:    Problems with blood clotting too easily:        Skin    Rashes or ulcers:        Constitutional    Fever or chills:     PHYSICAL EXAM:   Vitals:   06/13/17 1005  BP: (!) 187/74  Pulse: 64  Resp: 20  Temp: 98 F (36.7 C)  TempSrc: Oral  SpO2: 97%  Weight: 170 lb (77.1 kg)  Height: 5\' 6"  (1.676 m)    GENERAL: The patient is a well-nourished female, in no acute distress. The vital signs are documented above. CARDIAC: There is a regular rate and rhythm.  VASCULAR: Palpable left radial pulse PULMONARY: Nonlabored  respirations.  MUSCULOSKELETAL: There are no major deformities or cyanosis. NEUROLOGIC: No focal weakness or paresthesias are detected. SKIN: There are no ulcers or  rashes noted. PSYCHIATRIC: The patient has a normal affect.  STUDIES:   I have ordered and reviewed her vascular lab studies.  She does not have an adequate cephalic or basilic vein on the left.  She has normal arterial duplex  ASSESSMENT and PLAN   Chronic renal insufficiency, stage V: The patient does not have an adequate left cephalic or basilic vein for dialysis access.  She is not a candidate for right arm intervention given her history of breast cancer.  Therefore, I have recommended proceeding with a forearm or upper arm graft, based on the evaluation of the brachial vein in the operating room.  This was discussed with the patient.  I discussed trying to place a forearm graft if possible given her limited access site options.  She is going to speak with Dr. Hollie Salk regarding the timing of getting this done.  She will need to be off of her Plavix prior to her operation.  The patient will contact me after seeing Dr. Hollie Salk next week to determine when to get this scheduled.  Annamarie Major, MD [facility] [phone] Pager [phone]

## 2017-07-01 ENCOUNTER — Other Ambulatory Visit: Payer: Self-pay | Admitting: Internal Medicine

## 2017-07-01 DIAGNOSIS — K2100 Gastro-esophageal reflux disease with esophagitis, without bleeding: Secondary | ICD-10-CM

## 2017-07-01 DIAGNOSIS — Z853 Personal history of malignant neoplasm of breast: Secondary | ICD-10-CM

## 2017-07-01 DIAGNOSIS — R1310 Dysphagia, unspecified: Secondary | ICD-10-CM

## 2017-07-05 ENCOUNTER — Other Ambulatory Visit: Payer: Self-pay | Admitting: *Deleted

## 2017-07-06 ENCOUNTER — Telehealth: Payer: Self-pay | Admitting: *Deleted

## 2017-07-06 NOTE — Telephone Encounter (Signed)
-----   Message from Madelon Lips, MD sent at 07/06/2017  8:47 AM EDT ----- Regarding: RE: Medication clearance for surgery Good morning!  I'm OK with it- had CVA 07/2015.    Thanks for taking care of her.  Best,  Madelon Lips  CKA ----- Message ----- From: Willy Eddy, RN Sent: 07/05/2017  11:28 AM To: Madelon Lips, MD, Angelica Pou, MD Subject: Medication clearance for surgery               Patient scheduled for hemodialysis access with Dr. Trula Slade. Will need to hold Plavix for 5 days pre-op. Requesting clearance. Please advise. Thank you, Jacqlyn Larsen

## 2017-07-07 ENCOUNTER — Ambulatory Visit
Admission: RE | Admit: 2017-07-07 | Discharge: 2017-07-07 | Disposition: A | Discharge status: Home / Self Care | Payer: Medicare (Managed Care) | Source: Ambulatory Visit | Attending: Internal Medicine | Admitting: Internal Medicine

## 2017-07-07 DIAGNOSIS — K2100 Gastro-esophageal reflux disease with esophagitis, without bleeding: Secondary | ICD-10-CM

## 2017-07-07 DIAGNOSIS — R1310 Dysphagia, unspecified: Secondary | ICD-10-CM

## 2017-07-12 ENCOUNTER — Ambulatory Visit
Admission: RE | Admit: 2017-07-12 | Discharge: 2017-07-12 | Disposition: A | Discharge status: Home / Self Care | Payer: Medicare (Managed Care) | Source: Ambulatory Visit | Attending: Internal Medicine | Admitting: Internal Medicine

## 2017-07-12 DIAGNOSIS — Z853 Personal history of malignant neoplasm of breast: Secondary | ICD-10-CM

## 2017-07-13 ENCOUNTER — Encounter (HOSPITAL_COMMUNITY): Payer: Self-pay | Admitting: *Deleted

## 2017-07-13 ENCOUNTER — Other Ambulatory Visit: Payer: Self-pay

## 2017-07-13 NOTE — Progress Notes (Signed)
Pt Daughter, [name] called and stated that pt new Cardiologist is Dr. Terrence Dupont; records requested. Daughter also made aware of morning medications that pt should take with a sip of water. Daughter verbalized understanding of all pre-op instructions.

## 2017-07-13 NOTE — Progress Notes (Signed)
Initial SDW-pre-op call completed by pt daughter Wray Kearns. Daughter denies that pt C/O any acute cardiopulmonary issues. Daughter made aware to have pt stop taking otc vitamins, fish oil and herbal medications. Do not take any NSAIDs ie: Ibuprofen, Advil, Naproxen(Aleve), Motrin, BC and Goody powder. Daughter made aware to have pt take 6 units of Levemir insulin tonight. Have pt to check BG every 2 hours prior to arrival to hospital on DOS. Pt made aware to treat a BG < 70 with 4 ounces of apple juice, wait 15 minutes after intervention to recheck BG, if BG remains < 70, call Short Stay unit to speak with a nurse. Pt daughter, [name] stated that pt last dose of Plavix was Friday. Both daughters verbalized understanding of all pre-op instructions.

## 2017-07-13 NOTE — Progress Notes (Signed)
Anesthesia Chart Review:  Pt is a same-day workup.  Patient is an 84 year old female scheduled for insertion of AV Gore-Tex graft left arm on 07/14/2017 with Harold Barban, MD  - PCP is Dorian Pod, MD - Cardiologist is Dixie Dials, MD  - Nephrologist is Madelon Lips, MD  PMH includes:  CAD (moderate disease LAD, CX; mild disease RCA by 2010 cath), CHF, HTN, DM, hyperlipidemia, stroke, breast cancer, CKD (stage IV), GERD. Never smoker. BMI 27.5. S/p R partial mastectomy 07/17/15  - Hospitalized 11/5-11/11/18 for acute respiratory failure with hypoxia in the setting of acute pulmonary edema,due to acute on chronic diastolic CHF. Elevated troponin thought due to demand ischemia. Acute on chronic kidney disease  - Hospitalized 9/9-9/13/18 for acute kidney injury on CKD with volume overload, acute on chronic diastolic CHF, uncontrolled HTN, acute hypoxic respiratory failure   Medications include: amlodipine, ASA 81mg , carvedilol, clonidine, Plavix, Pepcid, hydralazine, Levemir, Imdur, potassium, pravastatin, torsemide. Pt to hold plavix 5 days before surgery  Labs will be obtained day of surgery - HbA1c was 9.6 on 03/01/17  1 view CXR 03/06/17:  - Airspace consolidation consistent with pneumonia left base. Mild right base atelectasis. No new opacity. Lungs elsewhere clear. Stable cardiac silhouette. There is aortic atherosclerosis.  EKG 02/28/17: Sinus rhythm. Borderline repolarization abnormality. Prolonged QT interval. Baseline wander in lead(s) V3  Echo 01/03/17:  - Left ventricle: The cavity size was normal. Wall thickness was increased in a pattern of severe LVH. Systolic function was vigorous. The estimated ejection fraction was in the range of 65% to 70%. Doppler parameters are consistent with a reversible restrictive pattern, indicative of decreased left ventricular diastolic compliance and/or increased left atrial pressure (grade 3 diastolic dysfunction). Doppler parameters  are consistent with high ventricular filling pressure. - Mitral valve: Calcified annulus. Mildly thickened leaflets. There was mild to moderate regurgitation. - Left atrium: The atrium was moderately to severely dilated. - Pulmonary arteries: PA peak pressure: 51 mm Hg (S).  Carotid duplex 07/05/14: Minimal amount of bilateral atherosclerotic plaque, left subjectively greater than right, not resulting in a hemodynamically significant stenosis.  Nuclear stress test 01/18/14: Low risk stress nuclear study with a fixed defect in the mid and basal inferior wall due to attenuation artifact and increased gut uptake. No ischemia noted. LV Ejection Fraction: 53%. LV Wall Motion: NL LV Function; NL Wall Motion  Cardiac cath 02/14/09: Nonobstructive CAD, unchanged from previous (see below). EF 70%, normal wall motion.   Cardiac cath 10/08/02:  1. Hypodynamic left ventricular function. 2. Scattered disease of the left anterior descending (50-60%) and diagonal (50-60%). 3. Moderate obstruction (60-70%) of the AV circumflex after the large marginal takeoff. 4. Scattered irregularities of the right coronary artery (30-40%)  If labs acceptable day of surgery and no acute CV symptoms day of surgery, I anticipate pt can proceed as scheduled.   Willeen Cass, FNP-BC [facility]/Anesthesiology Phone: [phone] 07/13/2017 3:08 PM

## 2017-07-14 ENCOUNTER — Ambulatory Visit (HOSPITAL_COMMUNITY): Payer: Medicare (Managed Care) | Admitting: Emergency Medicine

## 2017-07-14 ENCOUNTER — Ambulatory Visit (HOSPITAL_COMMUNITY)
Admission: RE | Admit: 2017-07-14 | Discharge: 2017-07-14 | Disposition: A | Discharge status: Home / Self Care | Payer: Medicare (Managed Care) | Source: Ambulatory Visit | Attending: Surgery | Admitting: Surgery

## 2017-07-14 ENCOUNTER — Encounter (HOSPITAL_COMMUNITY): Payer: Self-pay | Admitting: *Deleted

## 2017-07-14 ENCOUNTER — Encounter (HOSPITAL_COMMUNITY)
Admission: RE | Disposition: A | Discharge status: Home / Self Care | Payer: Self-pay | Source: Ambulatory Visit | Attending: Surgery

## 2017-07-14 DIAGNOSIS — Z79899 Other long term (current) drug therapy: Secondary | ICD-10-CM | POA: Insufficient documentation

## 2017-07-14 DIAGNOSIS — E7800 Pure hypercholesterolemia, unspecified: Secondary | ICD-10-CM | POA: Insufficient documentation

## 2017-07-14 DIAGNOSIS — K219 Gastro-esophageal reflux disease without esophagitis: Secondary | ICD-10-CM | POA: Diagnosis not present

## 2017-07-14 DIAGNOSIS — N184 Chronic kidney disease, stage 4 (severe): Secondary | ICD-10-CM

## 2017-07-14 DIAGNOSIS — E1122 Type 2 diabetes mellitus with diabetic chronic kidney disease: Secondary | ICD-10-CM | POA: Diagnosis not present

## 2017-07-14 DIAGNOSIS — N186 End stage renal disease: Secondary | ICD-10-CM | POA: Insufficient documentation

## 2017-07-14 DIAGNOSIS — Z923 Personal history of irradiation: Secondary | ICD-10-CM | POA: Insufficient documentation

## 2017-07-14 DIAGNOSIS — I5032 Chronic diastolic (congestive) heart failure: Secondary | ICD-10-CM | POA: Diagnosis not present

## 2017-07-14 DIAGNOSIS — Z853 Personal history of malignant neoplasm of breast: Secondary | ICD-10-CM | POA: Insufficient documentation

## 2017-07-14 DIAGNOSIS — Z794 Long term (current) use of insulin: Secondary | ICD-10-CM | POA: Diagnosis not present

## 2017-07-14 DIAGNOSIS — N185 Chronic kidney disease, stage 5: Secondary | ICD-10-CM | POA: Diagnosis not present

## 2017-07-14 DIAGNOSIS — I132 Hypertensive heart and chronic kidney disease with heart failure and with stage 5 chronic kidney disease, or end stage renal disease: Secondary | ICD-10-CM | POA: Diagnosis present

## 2017-07-14 DIAGNOSIS — I2510 Atherosclerotic heart disease of native coronary artery without angina pectoris: Secondary | ICD-10-CM | POA: Insufficient documentation

## 2017-07-14 DIAGNOSIS — Z7982 Long term (current) use of aspirin: Secondary | ICD-10-CM | POA: Diagnosis not present

## 2017-07-14 DIAGNOSIS — Z7902 Long term (current) use of antithrombotics/antiplatelets: Secondary | ICD-10-CM | POA: Diagnosis not present

## 2017-07-14 DIAGNOSIS — M1990 Unspecified osteoarthritis, unspecified site: Secondary | ICD-10-CM | POA: Diagnosis not present

## 2017-07-14 DIAGNOSIS — Z8673 Personal history of transient ischemic attack (TIA), and cerebral infarction without residual deficits: Secondary | ICD-10-CM | POA: Diagnosis not present

## 2017-07-14 DIAGNOSIS — E1151 Type 2 diabetes mellitus with diabetic peripheral angiopathy without gangrene: Secondary | ICD-10-CM | POA: Insufficient documentation

## 2017-07-14 HISTORY — DX: Heart failure, unspecified: I50.9

## 2017-07-14 HISTORY — PX: AV FISTULA PLACEMENT: SHX1204

## 2017-07-14 LAB — POCT I-STAT 4, (NA,K, GLUC, HGB,HCT)
Glucose, Bld: 225 mg/dL — ABNORMAL HIGH (ref 65–99)
HCT: 30 % — ABNORMAL LOW (ref 36.0–46.0)
Hemoglobin: 10.2 g/dL — ABNORMAL LOW (ref 12.0–15.0)
Potassium: 4.1 mmol/L (ref 3.5–5.1)
SODIUM: 141 mmol/L (ref 135–145)

## 2017-07-14 LAB — GLUCOSE, CAPILLARY
GLUCOSE-CAPILLARY: 302 mg/dL — AB (ref 65–99)
GLUCOSE-CAPILLARY: 281 mg/dL — AB (ref 65–99)

## 2017-07-14 SURGERY — INSERTION OF ARTERIOVENOUS (AV) GORE-TEX GRAFT ARM
Anesthesia: Monitor Anesthesia Care | Site: Arm Upper | Laterality: Left

## 2017-07-14 MED ORDER — PHENYLEPHRINE 40 MCG/ML (10ML) SYRINGE FOR IV PUSH (FOR BLOOD PRESSURE SUPPORT)
PREFILLED_SYRINGE | INTRAVENOUS | Status: AC
  Filled 2017-07-14: qty 10

## 2017-07-14 MED ORDER — HEPARIN SODIUM (PORCINE) 5000 UNIT/ML IJ SOLN
INTRAMUSCULAR | Status: DC | PRN
  Administered 2017-07-14: 11:00:00 500 mL

## 2017-07-14 MED ORDER — FENTANYL CITRATE (PF) 250 MCG/5ML IJ SOLN
INTRAMUSCULAR | Status: AC
  Filled 2017-07-14: qty 5

## 2017-07-14 MED ORDER — ONDANSETRON HCL 4 MG/2ML IJ SOLN
INTRAMUSCULAR | Status: AC
  Filled 2017-07-14: qty 2

## 2017-07-14 MED ORDER — CHLORHEXIDINE GLUCONATE 4 % EX LIQD: 60.0000 mL | Freq: Once | CUTANEOUS | Status: DC

## 2017-07-14 MED ORDER — LIDOCAINE-EPINEPHRINE (PF) 1 %-1:200000 IJ SOLN
INTRAMUSCULAR | Status: AC
  Filled 2017-07-14: qty 30

## 2017-07-14 MED ORDER — FENTANYL CITRATE (PF) 100 MCG/2ML IJ SOLN: 25.0000 ug | INTRAMUSCULAR | Status: DC | PRN

## 2017-07-14 MED ORDER — EPHEDRINE SULFATE-NACL 50-0.9 MG/10ML-% IV SOSY
PREFILLED_SYRINGE | INTRAVENOUS | Status: DC | PRN
  Administered 2017-07-14 (×2): 10 mg via INTRAVENOUS

## 2017-07-14 MED ORDER — LIDOCAINE-EPINEPHRINE (PF) 1 %-1:200000 IJ SOLN
INTRAMUSCULAR | Status: DC | PRN
  Administered 2017-07-14: 5 mL

## 2017-07-14 MED ORDER — INSULIN ASPART 100 UNIT/ML ~~LOC~~ SOLN
SUBCUTANEOUS | Status: AC
  Filled 2017-07-14: qty 1

## 2017-07-14 MED ORDER — PROPOFOL 500 MG/50ML IV EMUL
INTRAVENOUS | Status: DC | PRN
  Administered 2017-07-14: 25 ug/kg/min via INTRAVENOUS

## 2017-07-14 MED ORDER — ONDANSETRON HCL 4 MG/2ML IJ SOLN
INTRAMUSCULAR | Status: DC | PRN
  Administered 2017-07-14: 4 mg via INTRAVENOUS

## 2017-07-14 MED ORDER — HEPARIN SODIUM (PORCINE) 1000 UNIT/ML IJ SOLN
INTRAMUSCULAR | Status: DC | PRN
  Administered 2017-07-14: 3000 [IU] via INTRAVENOUS

## 2017-07-14 MED ORDER — FENTANYL CITRATE (PF) 250 MCG/5ML IJ SOLN
INTRAMUSCULAR | Status: DC | PRN
  Administered 2017-07-14: 25 ug via INTRAVENOUS

## 2017-07-14 MED ORDER — OXYCODONE-ACETAMINOPHEN 5-325 MG PO TABS: 1.0000 | ORAL_TABLET | Freq: Three times a day (TID) | ORAL | 0 refills | Status: DC | PRN

## 2017-07-14 MED ORDER — SODIUM CHLORIDE 0.9 % IV SOLN
INTRAVENOUS | Status: DC
  Administered 2017-07-14: 10:00:00 via INTRAVENOUS

## 2017-07-14 MED ORDER — CEFAZOLIN SODIUM-DEXTROSE 2-4 GM/100ML-% IV SOLN
2.0000 g | INTRAVENOUS | Status: AC
  Administered 2017-07-14: 2 g via INTRAVENOUS
  Filled 2017-07-14: qty 100

## 2017-07-14 MED ORDER — PROTAMINE SULFATE 10 MG/ML IV SOLN
INTRAVENOUS | Status: DC | PRN
  Administered 2017-07-14: 25 mg via INTRAVENOUS

## 2017-07-14 MED ORDER — HEMOSTATIC AGENTS (NO CHARGE) OPTIME
TOPICAL | Status: DC | PRN
  Administered 2017-07-14: 1 via TOPICAL

## 2017-07-14 MED ORDER — INSULIN ASPART 100 UNIT/ML ~~LOC~~ SOLN
8.0000 [IU] | Freq: Once | SUBCUTANEOUS | Status: AC
  Administered 2017-07-14: 8 [IU] via SUBCUTANEOUS

## 2017-07-14 MED ORDER — PROPOFOL 10 MG/ML IV BOLUS
INTRAVENOUS | Status: DC | PRN
  Administered 2017-07-14: 20 mg via INTRAVENOUS

## 2017-07-14 MED ORDER — PHENYLEPHRINE HCL 10 MG/ML IJ SOLN
INTRAVENOUS | Status: DC | PRN
  Administered 2017-07-14: 25 ug/min via INTRAVENOUS

## 2017-07-14 MED ORDER — 0.9 % SODIUM CHLORIDE (POUR BTL) OPTIME
TOPICAL | Status: DC | PRN
  Administered 2017-07-14: 1000 mL

## 2017-07-14 SURGICAL SUPPLY — 36 items
ADH SKN CLS APL DERMABOND .7 (GAUZE/BANDAGES/DRESSINGS) ×1
ARMBAND PINK RESTRICT EXTREMIT (MISCELLANEOUS) ×6 IMPLANT
CANISTER SUCT 3000ML PPV (MISCELLANEOUS) ×3 IMPLANT
CLIP VESOCCLUDE MED 6/CT (CLIP) ×3 IMPLANT
CLIP VESOCCLUDE SM WIDE 6/CT (CLIP) ×3 IMPLANT
DERMABOND ADVANCED (GAUZE/BANDAGES/DRESSINGS) ×2
DERMABOND ADVANCED .7 DNX12 (GAUZE/BANDAGES/DRESSINGS) ×1 IMPLANT
ELECT REM PT RETURN 9FT ADLT (ELECTROSURGICAL) ×3
ELECTRODE REM PT RTRN 9FT ADLT (ELECTROSURGICAL) ×1 IMPLANT
GLOVE BIO SURGEON STRL SZ 6.5 (GLOVE) ×2 IMPLANT
GLOVE BIO SURGEON STRL SZ8 (GLOVE) ×2 IMPLANT
GLOVE BIO SURGEONS STRL SZ 6.5 (GLOVE) ×2
GLOVE BIOGEL PI IND STRL 6.5 (GLOVE) IMPLANT
GLOVE BIOGEL PI IND STRL 7.5 (GLOVE) ×1 IMPLANT
GLOVE BIOGEL PI INDICATOR 6.5 (GLOVE) ×4
GLOVE BIOGEL PI INDICATOR 7.5 (GLOVE) ×2
GLOVE ECLIPSE 6.5 STRL STRAW (GLOVE) ×2 IMPLANT
GLOVE SURG SS PI 7.5 STRL IVOR (GLOVE) ×3 IMPLANT
GOWN STRL REUS W/ TWL LRG LVL3 (GOWN DISPOSABLE) ×2 IMPLANT
GOWN STRL REUS W/ TWL XL LVL3 (GOWN DISPOSABLE) ×1 IMPLANT
GOWN STRL REUS W/TWL LRG LVL3 (GOWN DISPOSABLE) ×6
GOWN STRL REUS W/TWL XL LVL3 (GOWN DISPOSABLE) ×3
HEMOSTAT SNOW SURGICEL 2X4 (HEMOSTASIS) ×2 IMPLANT
KIT BASIN OR (CUSTOM PROCEDURE TRAY) ×3 IMPLANT
KIT ROOM TURNOVER OR (KITS) ×3 IMPLANT
NS IRRIG 1000ML POUR BTL (IV SOLUTION) ×3 IMPLANT
PACK CV ACCESS (CUSTOM PROCEDURE TRAY) ×3 IMPLANT
PAD ARMBOARD 7.5X6 YLW CONV (MISCELLANEOUS) ×6 IMPLANT
SUT PROLENE 6 0 BV (SUTURE) ×8 IMPLANT
SUT VIC AB 3-0 SH 27 (SUTURE) ×6
SUT VIC AB 3-0 SH 27X BRD (SUTURE) ×2 IMPLANT
SUT VICRYL 4-0 PS2 18IN ABS (SUTURE) ×4 IMPLANT
SYR TOOMEY 50ML (SYRINGE) IMPLANT
TOWEL GREEN STERILE (TOWEL DISPOSABLE) ×3 IMPLANT
UNDERPAD 30X30 (UNDERPADS AND DIAPERS) ×3 IMPLANT
WATER STERILE IRR 1000ML POUR (IV SOLUTION) ×3 IMPLANT

## 2017-07-14 NOTE — Transfer of Care (Signed)
Immediate Anesthesia Transfer of Care Note  Patient: Glenda [name]  Procedure(s) Performed: Left arm Brachicephalic Fistula Creation (Left Arm Upper)  Patient Location: PACU  Anesthesia Type:MAC  Level of Consciousness: drowsy  Airway & Oxygen Therapy: Patient Spontanous Breathing and Patient connected to face mask oxygen  Post-op Assessment: Report given to RN and Post -op Vital signs reviewed and stable  Post vital signs: Reviewed and stable  Last Vitals:  Vitals Value Taken Time  BP 127/62 07/14/2017 11:27 AM  Temp    Pulse 65 07/14/2017 11:28 AM  Resp 11 07/14/2017 11:28 AM  SpO2 100 % 07/14/2017 11:28 AM  Vitals shown include unvalidated device data.  Last Pain:  Vitals:   07/14/17 0702  TempSrc: Oral      Patients Stated Pain Goal: 3 (01/31/11 1975)  Complications: No apparent anesthesia complications

## 2017-07-14 NOTE — H&P (Signed)
[facility] [place]  Patient name: Glenda [name]         MRN: [id number]        DOB: 03-11-1934        Sex: female   REQUESTING PROVIDER:    Dr. Hollie Salk   REASON FOR CONSULT:    ESRD  HISTORY OF PRESENT ILLNESS:   Glenda [name] is a 84 y.o. female, who is referred today for evaluation of dialysis access.  Her renal failure secondary to diabetes and hypertension.  She also suffers from chronic diastolic congestive heart failure.  She takes a statin for hypercholesterolemia.  She has a history of stroke.  She is on dual antiplatelet therapy.  She has a history of breast cancer with axillary node intervention  PAST MEDICAL HISTORY        Past Medical History:  Diagnosis Date  . Arthritis   . Breast cancer ([place])   . CAD (coronary artery disease)   . Chest pain   . Dizziness   . DM (diabetes mellitus) ([place])   . GERD (gastroesophageal reflux disease)   . HLD (hyperlipidemia)   . HTN (hypertension)   . Obesity   . Radiation 09/11/15-10/13/15   42.72 Gy to right breast, 12 Gy boost to right breast  . Shortness of breath dyspnea   . Stroke ([place])   . UTI (urinary tract infection)      FAMILY HISTORY        Family History  Problem Relation Age of Onset  . Diabetes Mother   . Colon cancer Father        also had lung  . Diabetes Unknown   . Heart attack Unknown   . Diabetes Sister        [name]  . Breast cancer Sister     SOCIAL HISTORY:   Social History        Socioeconomic History  . Marital status: Widowed    Spouse name: Not on file  . Number of children: 5  . Years of education: 52  . Highest education level: Not on file  Social Needs  . Financial resource strain: Not on file  . Food insecurity - worry: Not on file  . Food insecurity - inability: Not on file  . Transportation needs - medical: Not on file  . Transportation needs - non-medical: Not on file    Occupational History  . Occupation: Retired  Tobacco Use  . Smoking status: Never Smoker  . Smokeless tobacco: Never Used  Substance and Sexual Activity  . Alcohol use: No    Alcohol/week: 0.0 oz  . Drug use: No  . Sexual activity: Not Currently  Other Topics Concern  . Not on file  Social History Narrative   Retired.    Lives with daughter   Caffeine use: Drinks soda and coffee and tea daily    ALLERGIES:    No Known Allergies  CURRENT MEDICATIONS:          Current Outpatient Medications  Medication Sig Dispense Refill  . amLODipine (NORVASC) 10 MG tablet Take 10 mg by mouth daily. Reported on 10/31/2015    . aspirin 81 MG chewable tablet Chew 81 mg by mouth daily.    . carvedilol (COREG) 12.5 MG tablet Take 1 tablet (12.5 mg total) by mouth 2 (two) times daily with a meal. 60 tablet 0  . cetirizine (ZYRTEC) 10 MG tablet Take 10 mg daily by mouth.    . citalopram (CELEXA) 20  MG tablet Take 20 mg by mouth daily.    . cloNIDine (CATAPRES) 0.2 MG tablet Take 0.2 mg by mouth 2 (two) times daily.     . clopidogrel (PLAVIX) 75 MG tablet Take 75 mg by mouth daily.     . [facility] RANITIDINE 75 MG tablet Take 75 mg 2 (two) times daily by mouth.  12  . Famotidine (PEPCID AC PO) Take 2 tablets at bedtime as needed by mouth (ACID REFLUX).    Marland Kitchen HUMALOG MIX 50/50 KWIKPEN (50-50) 100 UNIT/ML Kwikpen Inject 16 Units into the skin. Before breakfast and 16 units before supper per Dr. Buddy Duty. Don's give if not eat meal  11  . hydrALAZINE (APRESOLINE) 25 MG tablet Take 1 tablet (25 mg total) every 8 (eight) hours by mouth. 90 tablet 0  . insulin detemir (LEVEMIR) 100 UNIT/ML injection Inject 0.12 mLs (12 Units total) into the skin at bedtime. 10 mL 11  . Nutritional Supplements (FEEDING SUPPLEMENT, NEPRO CARB STEADY,) LIQD Take 237 mLs by mouth 2 (two) times daily between meals. 30 Can 0  . potassium chloride SA (K-DUR,KLOR-CON) 20 MEQ tablet Take 2 tablets (40 mEq total)  daily by mouth. 30 tablet 0  . pravastatin (PRAVACHOL) 80 MG tablet Take 80 mg by mouth daily.    Marland Kitchen torsemide (DEMADEX) 100 MG tablet Take 1 tablet (100 mg total) 2 (two) times daily by mouth. 60 tablet 0  . isosorbide mononitrate (IMDUR) 30 MG 24 hr tablet Take 1 tablet (30 mg total) daily by mouth. 30 tablet 0   No current facility-administered medications for this visit.     REVIEW OF SYSTEMS:   [X]  denotes positive finding, [ ]  denotes negative finding Cardiac  Comments:  Chest pain or chest pressure:    Shortness of breath upon exertion:    Short of breath when lying flat:    Irregular heart rhythm:        Vascular    Pain in calf, thigh, or hip brought on by ambulation:    Pain in feet at night that wakes you up from your sleep:     Blood clot in your veins:    Leg swelling:  x       Pulmonary    Oxygen at home:    Productive cough:     Wheezing:  x       Neurologic    Sudden weakness in arms or legs:     Sudden numbness in arms or legs:     Sudden onset of difficulty speaking or slurred speech:    Temporary loss of vision in one eye:     Problems with dizziness:         Gastrointestinal    Blood in stool:      Vomited blood:         Genitourinary    Burning when urinating:     Blood in urine:        Psychiatric    Major depression:         Hematologic    Bleeding problems:    Problems with blood clotting too easily:        Skin    Rashes or ulcers:        Constitutional    Fever or chills:     PHYSICAL EXAM:      Vitals:   06/13/17 1005  BP: (!) 187/74  Pulse: 64  Resp: 20  Temp: 98 F (36.7 C)  TempSrc: Oral  SpO2: 97%  Weight: 170 lb (77.1 kg)  Height: 5\' 6"  (1.676 m)    GENERAL: The patient is a well-nourished female, in no acute distress. The vital signs are documented above. CARDIAC: There is a regular rate and rhythm.    VASCULAR: Palpable left radial pulse PULMONARY: Nonlabored respirations.  MUSCULOSKELETAL: There are no major deformities or cyanosis. NEUROLOGIC: No focal weakness or paresthesias are detected. SKIN: There are no ulcers or rashes noted. PSYCHIATRIC: The patient has a normal affect.  STUDIES:   I have ordered and reviewed her vascular lab studies.  She does not have an adequate cephalic or basilic vein on the left.  She has normal arterial duplex  ASSESSMENT and PLAN   Chronic renal insufficiency, stage V: The patient does not have an adequate left cephalic or basilic vein for dialysis access.  She is not a candidate for right arm intervention given her history of breast cancer.  Therefore, I have recommended proceeding with a forearm or upper arm graft, based on the evaluation of the brachial vein in the operating room.  This was discussed with the patient.  I discussed trying to place a forearm graft if possible given her limited access site options.  She is going to speak with Dr. Hollie Salk regarding the timing of getting this done.  She will need to be off of her Plavix prior to her operation.  The patient will contact me after seeing Dr. Hollie Salk next week to determine when to get this scheduled.  Annamarie Major, MD [facility] [phone] Pager [phone]   No interval changes.  Risks and benefits again discussed.  All questions answered  WElls Rankin Coolman

## 2017-07-14 NOTE — Anesthesia Preprocedure Evaluation (Addendum)
Anesthesia Evaluation  Patient identified by MRN, date of birth, ID band Patient awake    Reviewed: Allergy & Precautions, H&P , NPO status , Patient's Chart, lab work & pertinent test results, reviewed documented beta blocker date and time   Airway Mallampati: III  TM Distance: >3 FB Neck ROM: Full    Dental no notable dental hx. (+) Edentulous Upper, Dental Advisory Given, Partial Lower   Pulmonary neg pulmonary ROS,    Pulmonary exam normal breath sounds clear to auscultation       Cardiovascular hypertension, Pt. on medications and Pt. on home beta blockers + CAD, + Peripheral Vascular Disease and +CHF   Rhythm:Regular Rate:Normal     Neuro/Psych CVA, Residual Symptoms negative psych ROS   GI/Hepatic Neg liver ROS, GERD  ,  Endo/Other  diabetes, Insulin Dependent  Renal/GU CRFRenal disease  negative genitourinary   Musculoskeletal  (+) Arthritis , Osteoarthritis,    Abdominal   Peds  Hematology negative hematology ROS (+) anemia ,   Anesthesia Other Findings   Reproductive/Obstetrics negative OB ROS                            Anesthesia Physical Anesthesia Plan  ASA: III  Anesthesia Plan: MAC   Post-op Pain Management:    Induction: Intravenous  PONV Risk Score and Plan: 3 and Ondansetron, Propofol infusion and Treatment may vary due to age or medical condition  Airway Management Planned: Simple Face Mask  Additional Equipment:   Intra-op Plan:   Post-operative Plan:   Informed Consent: I have reviewed the patients History and Physical, chart, labs and discussed the procedure including the risks, benefits and alternatives for the proposed anesthesia with the patient or authorized representative who has indicated his/her understanding and acceptance.   Dental advisory given  Plan Discussed with: CRNA  Anesthesia Plan Comments:         Anesthesia Quick  Evaluation

## 2017-07-14 NOTE — Discharge Instructions (Signed)
° °  [facility]  Discharge Instructions  AV Fistula or Graft Surgery for Dialysis Access  Please refer to the following instructions for your post-procedure care. Your surgeon or physician assistant will discuss any changes with you.  Activity  You may drive the day following your surgery, if you are comfortable and no longer taking prescription pain medication. Resume full activity as the soreness in your incision resolves.  Bathing/Showering  You may shower after you go home. Keep your incision dry for 48 hours. Do not soak in a bathtub, hot tub, or swim until the incision heals completely. You may not shower if you have a hemodialysis catheter.  Incision Care  Clean your incision with mild soap and water after 48 hours. Pat the area dry with a clean towel. You do not need a bandage unless otherwise instructed. Do not apply any ointments or creams to your incision. You may have skin glue on your incision. Do not peel it off. It will come off on its own in about one week. Your arm may swell a bit after surgery. To reduce swelling use pillows to elevate your arm so it is above your heart. Your doctor will tell you if you need to lightly wrap your arm with an ACE bandage.  Diet  Resume your normal diet. There are not special food restrictions following this procedure. In order to heal from your surgery, it is CRITICAL to get adequate nutrition. Your body requires vitamins, minerals, and protein. Vegetables are the best source of vitamins and minerals. Vegetables also provide the perfect balance of protein. Processed food has little nutritional value, so try to avoid this.  Medications  Resume taking all of your medications. If your incision is causing pain, you may take over-the counter pain relievers such as acetaminophen (Tylenol). If you were prescribed a stronger pain medication, please be aware these medications can cause nausea and constipation. Prevent  nausea by taking the medication with a snack or meal. Avoid constipation by drinking plenty of fluids and eating foods with high amount of fiber, such as fruits, vegetables, and grains.  Do not take Tylenol if you are taking prescription pain medications.  Follow up Your surgeon may want to see you in the office following your access surgery. If so, this will be arranged at the time of your surgery.  Please call us immediately for any of the following conditions:  Increased pain, redness, drainage (pus) from your incision site Fever of 101 degrees or higher Severe or worsening pain at your incision site Hand pain or numbness.  Reduce your risk of vascular disease:  Stop smoking. If you would like help, call [facility] at 1-800-QUIT-NOW 865-250-4419) or [address] at [place] your cholesterol Maintain a desired weight Control your diabetes Keep your blood pressure down  Dialysis  It will take several weeks to several months for your new dialysis access to be ready for use. Your surgeon will determine when it is okay to use it. Your nephrologist will continue to direct your dialysis. You can continue to use your Permcath until your new access is ready for use.   07/14/2017 Glenda [name] [id number] 07-14-1933  Surgeon(s): Serafina Mitchell, MD  Procedure(s): Left arm 1st stage basilic vein transposition   Do not stick fistula for 12 weeks    If you have any questions, please call the office at [phone].

## 2017-07-14 NOTE — Anesthesia Postprocedure Evaluation (Signed)
Anesthesia Post Note  Patient: Skie Vitrano  Procedure(s) Performed: Left arm Brachicephalic Fistula Creation (Left Arm Upper)     Patient location during evaluation: PACU Anesthesia Type: MAC Level of consciousness: awake and alert Pain management: pain level controlled Vital Signs Assessment: post-procedure vital signs reviewed and stable Respiratory status: spontaneous breathing, nonlabored ventilation and respiratory function stable Cardiovascular status: stable and blood pressure returned to baseline Postop Assessment: no apparent nausea or vomiting Anesthetic complications: no    Last Vitals:  Vitals Value Taken Time  BP 98/52 07/14/2017  1:12 PM  Temp    Pulse 66 07/14/2017  1:13 PM  Resp 16 07/14/2017  1:13 PM  SpO2 98 % 07/14/2017  1:13 PM  Vitals shown include unvalidated device data.  Last Pain:  Vitals:   07/14/17 1230  TempSrc:   PainSc: 0-No pain                 Nazareth Kirk,W. EDMOND

## 2017-07-14 NOTE — Anesthesia Procedure Notes (Signed)
Procedure Name: MAC Date/Time: 07/14/2017 10:13 AM Performed by: Imagene Riches, CRNA Pre-anesthesia Checklist: Patient identified, Emergency Drugs available, Suction available and Patient being monitored Patient Re-evaluated:Patient Re-evaluated prior to induction Oxygen Delivery Method: Simple face mask Preoxygenation: Pre-oxygenation with 100% oxygen Induction Type: IV induction

## 2017-07-15 ENCOUNTER — Encounter (HOSPITAL_COMMUNITY): Payer: Self-pay | Admitting: Surgery

## 2017-07-15 NOTE — Op Note (Signed)
    Patient name: Glenda [name] MRN: [id number] DOB: [birth year] Sex: female  07/14/2017 Pre-operative Diagnosis: CKD 5 Post-operative diagnosis:  Same Surgeon:  Annamarie Major Assistants:  Leontine Locket Procedure:   First stage left basilic vein fistula Anesthesia: MAC Blood Loss: Minimal Specimens: None  Findings: 3-4 mm basilic vein  Indications: The patient comes in today for dialysis access.  Her preoperative vein mapping showed no adequate vein for fistula creation therefore we discussed placing a graft.  Procedure:  The patient was identified in the holding area and taken to [facility]  The patient was then placed supine on the table. MAC anesthesia was administered.  The patient was prepped and draped in the usual sterile fashion.  A time out was called and antibiotics were administered.  Ultrasound was used to evaluate the left upper arm.  She actually had a very healthy appearing 3-4 mm basilic vein and therefore I elected to proceed with basilic vein fistula creation.  1% lidocaine was used for local anesthesia.  An oblique incision was made just proximal to the antecubital crease.  I first dissected out the brachial artery.  This was a 3 mm artery with mild calcification.  I then dissected out the basilic vein.  This was a healthy-appearing 3-4 mm vein.  Once it was fully mobilized, it was marked for orientation and then ligated distally.  The vein distended nicely with heparin saline.  3000 units of heparin were administered.  The brachial artery was then occluded with vascular clamps and a #11 blade was used to make an arteriotomy.  The arteriotomy was extended longitudinally with Potts scissors.  The vein was then spatulated to fit the size of the arteriotomy and a running anastomosis was created with 6-0 Prolene.  Prior to completion the appropriate flushing maneuvers were performed and the anastomosis was completed.  I inspected the course of the vein to make sure there were no  kinks.  There was an excellent thrill within the fistula and a palpable radial pulse.  25 mg of protamine was then administered.  The incision was then closed with 2 layers of 3-0 Vicryl followed by Dermabond.  There were no immediate complications.   Disposition: To PACU stable   V. Annamarie Major, M.D. [facility] [place] Office: [phone] Pager:  [phone]

## 2017-07-19 ENCOUNTER — Telehealth: Payer: Self-pay | Admitting: Surgery

## 2017-07-19 NOTE — Telephone Encounter (Signed)
-----   Message from Mena Goes, RN sent at 07/18/2017  9:29 AM EDT ----- Regarding: 4-6 weeks    ----- Message ----- From: Serafina Mitchell, MD Sent: 07/15/2017  11:28 AM To: Vvs Charge Pool  07-14-2017:  Surgeon:  Annamarie Major Assistants:  Leontine Locket Procedure:   First stage left basilic vein fistula   Follow-up 4-6 weeks with duplex of fistula to see me

## 2017-07-19 NOTE — Telephone Encounter (Signed)
Confirmed pt 08/29/17 appt with daughter. Mailed letter.

## 2017-07-19 NOTE — Telephone Encounter (Signed)
-----   Message from Mena Goes, RN sent at 07/14/2017  2:20 PM EDT ----- Regarding: 4-6 weeks w/ duplex   ----- Message ----- From: Gabriel Earing, PA-C Sent: 07/14/2017  11:16 AM To: Vvs Charge Pool  S/p left 1st stage BVT 07/14/17.  F/u in Dr. [facility] clinic with duplex in 4-6 weeks.  Thanks

## 2017-07-19 NOTE — Telephone Encounter (Signed)
Confirmed 08/29/17 appt with pt's daughter. Mailed letter

## 2017-07-19 NOTE — Telephone Encounter (Signed)
Confirmed 08/29/17 appt with daughtet. Mailed letter

## 2017-08-01 ENCOUNTER — Other Ambulatory Visit: Payer: Self-pay | Admitting: Gastroenterology

## 2017-08-08 ENCOUNTER — Other Ambulatory Visit: Payer: Self-pay

## 2017-08-08 DIAGNOSIS — N185 Chronic kidney disease, stage 5: Secondary | ICD-10-CM

## 2017-08-11 ENCOUNTER — Other Ambulatory Visit: Payer: Self-pay | Admitting: Gastroenterology

## 2017-08-17 ENCOUNTER — Other Ambulatory Visit: Payer: Self-pay

## 2017-08-17 ENCOUNTER — Encounter (HOSPITAL_COMMUNITY): Payer: Self-pay | Admitting: *Deleted

## 2017-08-17 NOTE — Progress Notes (Signed)
Spoke with pt's daughter, Natalya Domzalski for pre-op call. She states pt has hx of CHF but no other cardiac history. Pt is a type 2 diabetic. States her fasting blood sugar can be "all over the place". Today is was 357, yesterday it was 151, Monday it was in the 300's. I instructed her to have pt take 70% of her Humalog 50/50 mix this evening (pt will take 14 units). Instructed her to check her mother's blood sugar in the AM when she gets up and every 2 hours until she leaves for the hospital. If blood sugar is 70 or below, treat with 1/2 cup of clear juice (apple or cranberry) and recheck blood sugar 15 minutes after drinking juice. Virgie states the office instructed her to give mom have of her Humalog 50/50 insulin in the AM if blood sugar is greater than 200. Pt is on Plavix and Aspirin. Virgie states they were told to stop it and last dose was 08/03/17.  Echo - 01/03/17 - Epic Stress - 01/18/14 - Epic EKG - 03/01/17 - Epic CXR (1-view) - 03/06/17 - Epic A1C - 9.6 on  03/01/18 - Epic

## 2017-08-18 ENCOUNTER — Encounter (HOSPITAL_COMMUNITY): Payer: Self-pay | Admitting: *Deleted

## 2017-08-18 ENCOUNTER — Other Ambulatory Visit: Payer: Self-pay

## 2017-08-18 ENCOUNTER — Ambulatory Visit (HOSPITAL_COMMUNITY)
Admission: RE | Admit: 2017-08-18 | Discharge: 2017-08-18 | Disposition: A | Discharge status: Home / Self Care | Payer: Medicare (Managed Care) | Source: Ambulatory Visit | Attending: Gastroenterology | Admitting: Gastroenterology

## 2017-08-18 ENCOUNTER — Ambulatory Visit (HOSPITAL_COMMUNITY): Payer: Medicare (Managed Care) | Admitting: Anesthesiology

## 2017-08-18 ENCOUNTER — Encounter (HOSPITAL_COMMUNITY)
Admission: RE | Disposition: A | Discharge status: Home / Self Care | Payer: Self-pay | Source: Ambulatory Visit | Attending: Gastroenterology

## 2017-08-18 DIAGNOSIS — I2510 Atherosclerotic heart disease of native coronary artery without angina pectoris: Secondary | ICD-10-CM | POA: Insufficient documentation

## 2017-08-18 DIAGNOSIS — Z79899 Other long term (current) drug therapy: Secondary | ICD-10-CM | POA: Insufficient documentation

## 2017-08-18 DIAGNOSIS — Z7902 Long term (current) use of antithrombotics/antiplatelets: Secondary | ICD-10-CM | POA: Insufficient documentation

## 2017-08-18 DIAGNOSIS — M1990 Unspecified osteoarthritis, unspecified site: Secondary | ICD-10-CM | POA: Insufficient documentation

## 2017-08-18 DIAGNOSIS — Z794 Long term (current) use of insulin: Secondary | ICD-10-CM | POA: Insufficient documentation

## 2017-08-18 DIAGNOSIS — T18128A Food in esophagus causing other injury, initial encounter: Secondary | ICD-10-CM | POA: Insufficient documentation

## 2017-08-18 DIAGNOSIS — K449 Diaphragmatic hernia without obstruction or gangrene: Secondary | ICD-10-CM | POA: Insufficient documentation

## 2017-08-18 DIAGNOSIS — E785 Hyperlipidemia, unspecified: Secondary | ICD-10-CM | POA: Insufficient documentation

## 2017-08-18 DIAGNOSIS — R1310 Dysphagia, unspecified: Secondary | ICD-10-CM | POA: Insufficient documentation

## 2017-08-18 DIAGNOSIS — I132 Hypertensive heart and chronic kidney disease with heart failure and with stage 5 chronic kidney disease, or end stage renal disease: Secondary | ICD-10-CM | POA: Diagnosis not present

## 2017-08-18 DIAGNOSIS — K222 Esophageal obstruction: Secondary | ICD-10-CM | POA: Insufficient documentation

## 2017-08-18 DIAGNOSIS — I129 Hypertensive chronic kidney disease with stage 1 through stage 4 chronic kidney disease, or unspecified chronic kidney disease: Secondary | ICD-10-CM | POA: Insufficient documentation

## 2017-08-18 DIAGNOSIS — X58XXXA Exposure to other specified factors, initial encounter: Secondary | ICD-10-CM

## 2017-08-18 DIAGNOSIS — Z800 Family history of malignant neoplasm of digestive organs: Secondary | ICD-10-CM | POA: Insufficient documentation

## 2017-08-18 DIAGNOSIS — Z8673 Personal history of transient ischemic attack (TIA), and cerebral infarction without residual deficits: Secondary | ICD-10-CM

## 2017-08-18 DIAGNOSIS — N189 Chronic kidney disease, unspecified: Secondary | ICD-10-CM | POA: Insufficient documentation

## 2017-08-18 DIAGNOSIS — Z7982 Long term (current) use of aspirin: Secondary | ICD-10-CM | POA: Insufficient documentation

## 2017-08-18 DIAGNOSIS — R0602 Shortness of breath: Secondary | ICD-10-CM | POA: Diagnosis not present

## 2017-08-18 DIAGNOSIS — E1122 Type 2 diabetes mellitus with diabetic chronic kidney disease: Secondary | ICD-10-CM

## 2017-08-18 HISTORY — PX: BALLOON DILATION: SHX5330

## 2017-08-18 HISTORY — PX: ESOPHAGOGASTRODUODENOSCOPY (EGD) WITH PROPOFOL: SHX5813

## 2017-08-18 HISTORY — DX: Sleep apnea, unspecified: G47.30

## 2017-08-18 LAB — GLUCOSE, CAPILLARY
Glucose-Capillary: 305 mg/dL — ABNORMAL HIGH (ref 65–99)
Glucose-Capillary: 336 mg/dL — ABNORMAL HIGH (ref 65–99)

## 2017-08-18 IMAGING — MR MR MRA HEAD W/O CM
6 series · 16 of 16 positions shown · IV contrast (Yes   MH)
Comparison: MR brain 08/24/2015.

CLINICAL DATA: Continued surveillance of acute posterior
circulation infarct.

EXAM:
MRA NECK WITHOUT AND WITH CONTRAST
MRA HEAD WITHOUT CONTRAST
TECHNIQUE: Multiplanar and multiecho pulse sequences of the neck were obtained
without and with intravenous contrast. Angiographic images of the
neck were obtained using MRA technique without and with intravenous
contrast.; Angiographic images of the Circle of Willis were obtained
using MRA technique without intravenous contrast.
CONTRAST:  7mL MULTIHANCE GADOBENATE DIMEGLUMINE 529 MG/ML IV SOLN

[Series 2: ax (id) fast · axial · 2.8mm · 0.43mm/px · z∈[-41,+64]mm · 2 of 152 slices shown]
[im 1/152]
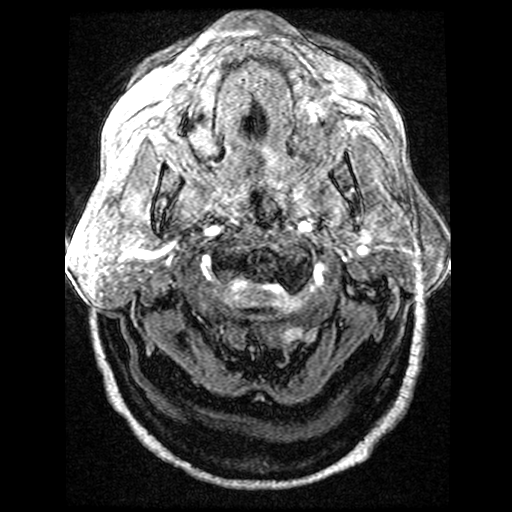
[im 152/152]
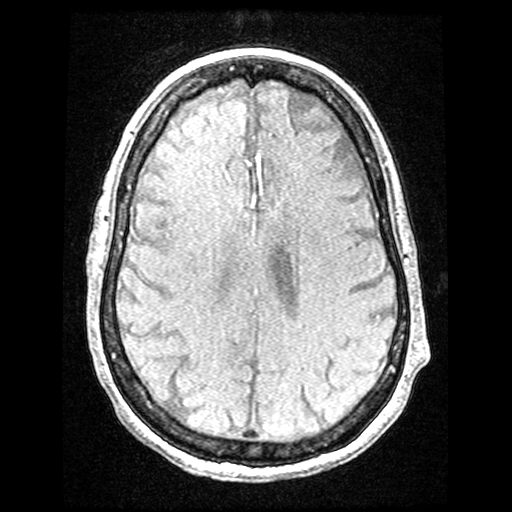

[Series 4: ax (id) acr · axial · 1.2mm · 0.47mm/px · z∈[-147,-34]mm · 2 of 142 slices shown]
[im 1/142]
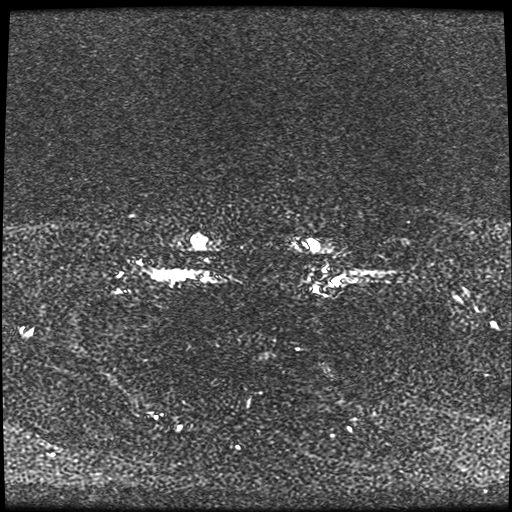
[im 142/142]
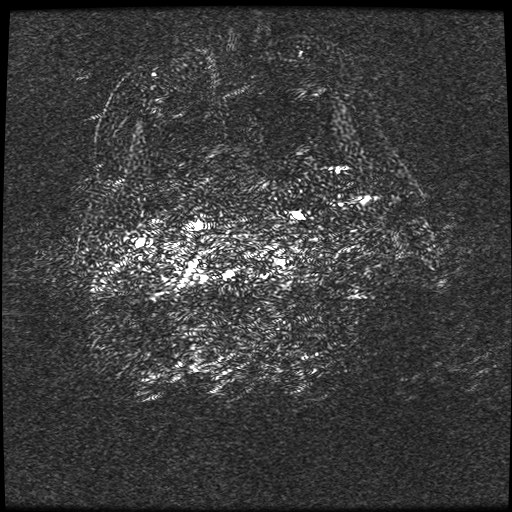

[Series 6: sag inhance (id) · sagittal · 1.2mm · 0.47mm/px · 6 of 360 slices shown]
[im 1/360]
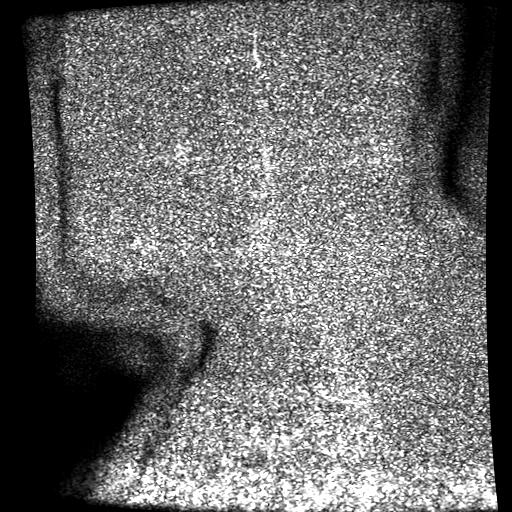
[im 72/360]
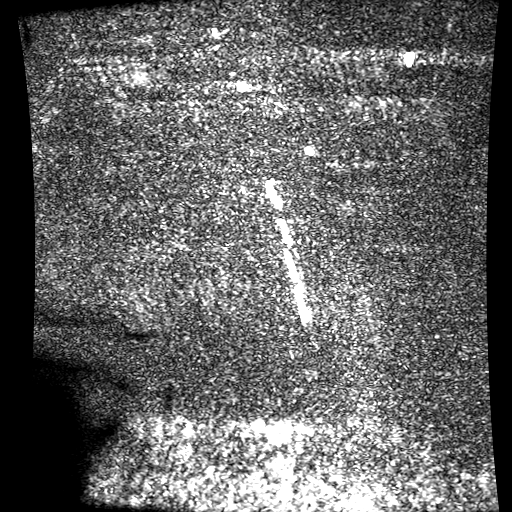
[im 144/360]
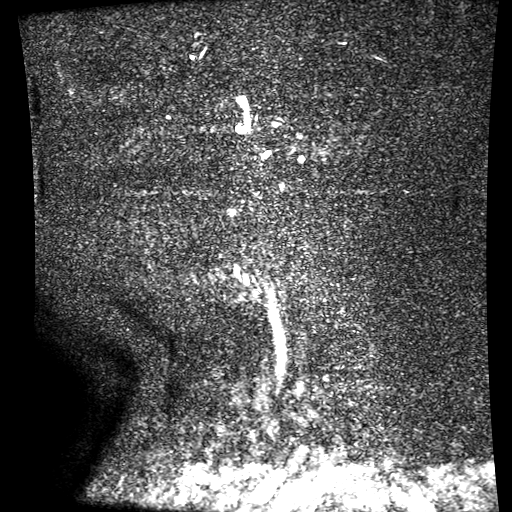
[im 216/360]
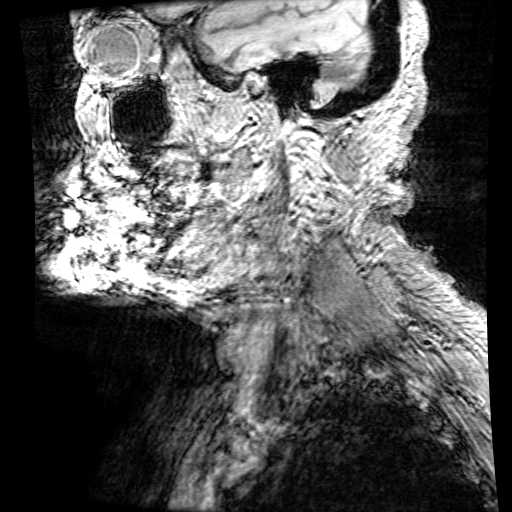
[im 288/360]
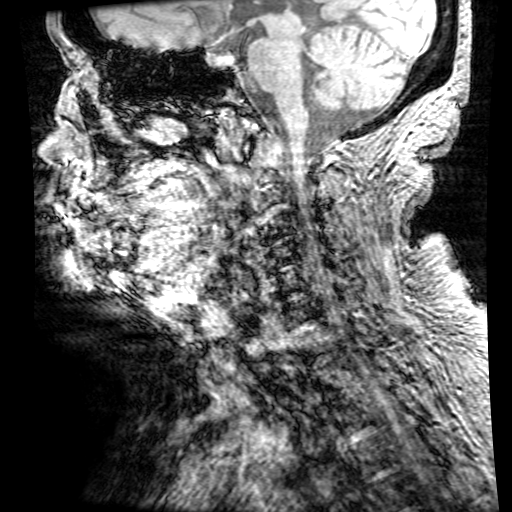
[im 360/360]
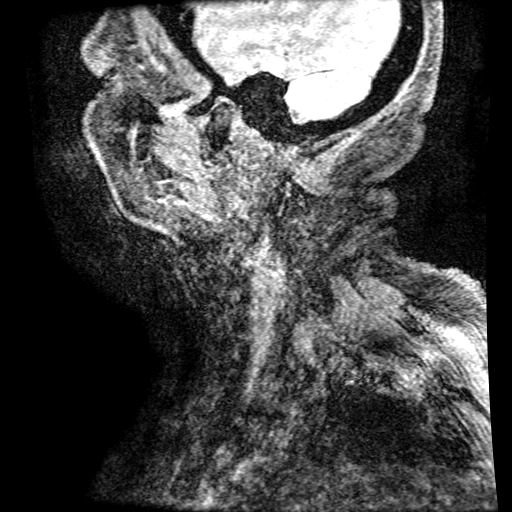

[Series 800: cor cemra ft · coronal · 1.4mm · 0.59mm/px · 2 of 105 slices shown]
[im 1/105]
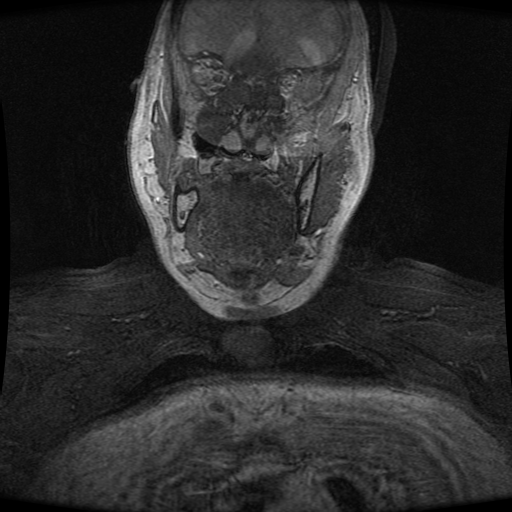
[im 105/105]
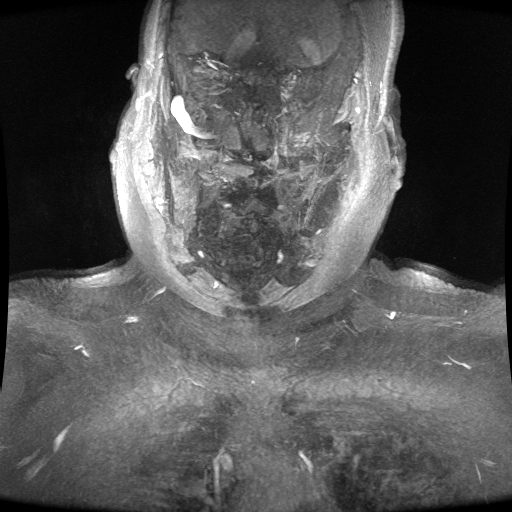

[Series 801: ph1/cor cemra ft · coronal · 1.4mm · 0.59mm/px · 2 of 105 slices shown]
[im 1/105]
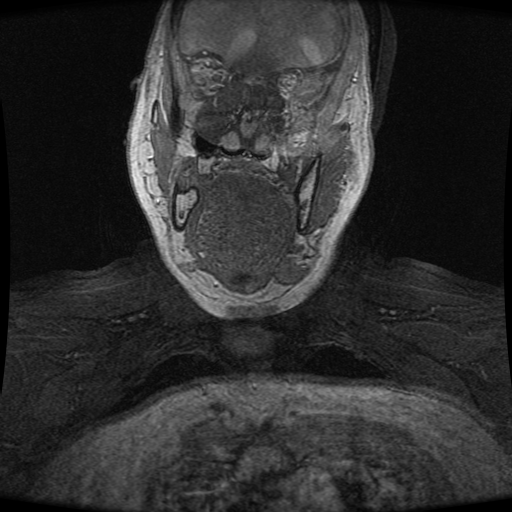
[im 105/105]
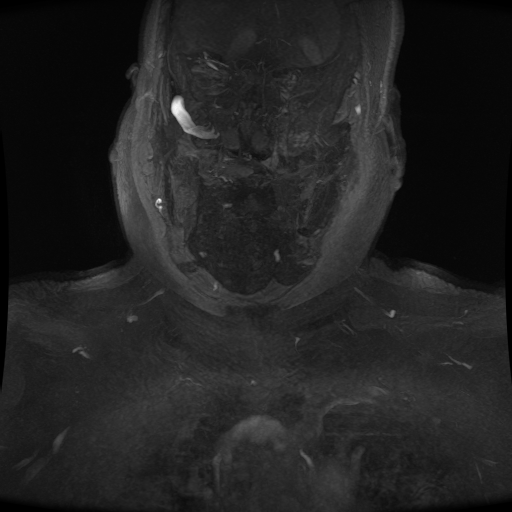

[((date))-((date)) · coronal · 1.4mm · 0.59mm/px · 2 of 105 slices shown]
[im 1/105]
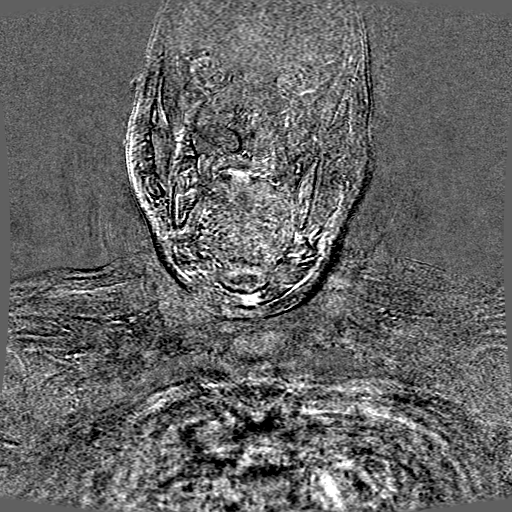
[im 105/105]
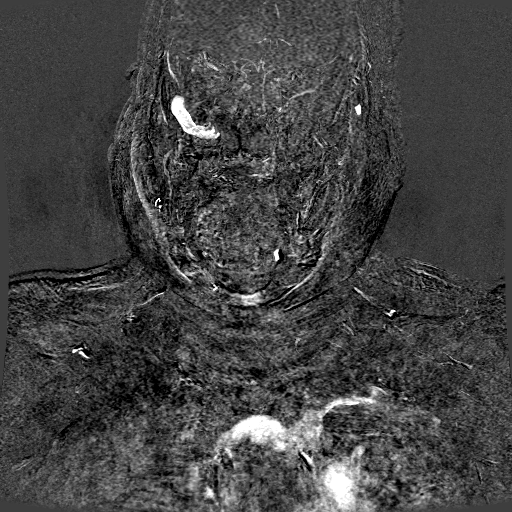

[16 of 16 positions shown; findings below may reference images not displayed]

FINDINGS: MRA NECK FINDINGS

Dolichoectasia proximal vasculature. Conventional branching of the
great vessels from the arch.

The proximal RIGHT common carotid artery demonstrates a estimated
75% stenosis. No flow-limiting disease at the bifurcation or
throughout the course of the visualized RIGHT cervical ICA.

Poorly visualized proximal LEFT common carotid artery without
definite proximal narrowing. No significant atheromatous change at
the bifurcation. Cervical ICA widely patent on the LEFT.

Both vertebral arteries are tortuous. They are equal in size. No
definite ostial lesion. There may be a 75% stenosis of the proximal
RIGHT vertebral 2 cm above its origin.

MRA HEAD FINDINGS

Mild non stenotic irregularity of the cavernous and supraclinoid ICA
segments on the RIGHT. 50% smooth stenosis of the cavernous and
supraclinoid ICA segments on the LEFT. No proximal stenosis of the
anterior or middle cerebral arteries. No MCA branch occlusion. No
visible intracranial aneurysm.

Basilar artery is widely patent. Both vertebrals contribute equally
to its formation. There is no significant proximal PCA disease or
cerebellar branch occlusion.
IMPRESSION: 50% stenosis of the cavernous and supraclinoid ICA segments on the
LEFT, non flow reducing.

No significant intracranial posterior circulation disease.

No extracranial carotid bifurcation or cervical ICA lesion.

Proximal RIGHT common carotid artery and proximal RIGHT vertebral
artery as described above.

## 2017-08-18 SURGERY — ESOPHAGOGASTRODUODENOSCOPY (EGD) WITH PROPOFOL
Anesthesia: Monitor Anesthesia Care

## 2017-08-18 MED ORDER — SODIUM CHLORIDE 0.9 % IV SOLN: INTRAVENOUS | Status: DC

## 2017-08-18 MED ORDER — ONDANSETRON HCL 4 MG/2ML IJ SOLN
INTRAMUSCULAR | Status: DC | PRN
  Administered 2017-08-18: 4 mg via INTRAVENOUS

## 2017-08-18 MED ORDER — SODIUM CHLORIDE 0.9 % IV SOLN
INTRAVENOUS | Status: DC
  Administered 2017-08-18: 09:00:00 via INTRAVENOUS

## 2017-08-18 MED ORDER — PROPOFOL 500 MG/50ML IV EMUL
INTRAVENOUS | Status: DC | PRN
  Administered 2017-08-18: 100 ug/kg/min via INTRAVENOUS

## 2017-08-18 MED ORDER — PROPOFOL 10 MG/ML IV BOLUS
INTRAVENOUS | Status: DC | PRN
  Administered 2017-08-18: 10 mg via INTRAVENOUS

## 2017-08-18 SURGICAL SUPPLY — 15 items

## 2017-08-18 NOTE — Transfer of Care (Signed)
Immediate Anesthesia Transfer of Care Note  Patient: Glenda [name]  Procedure(s) Performed: ESOPHAGOGASTRODUODENOSCOPY (EGD) WITH PROPOFOL (N/A ) BALLOON DILATION (N/A )  Patient Location: PACU and Endoscopy Unit  Anesthesia Type:MAC  Level of Consciousness: awake and alert   Airway & Oxygen Therapy: Patient Spontanous Breathing and Patient connected to nasal cannula oxygen  Post-op Assessment: Report given to RN and Post -op Vital signs reviewed and stable  Post vital signs: Reviewed and stable  Last Vitals:  Vitals Value Taken Time  BP 152/44 08/18/2017 10:54 AM  Temp 36.9 C 08/18/2017 10:53 AM  Pulse 66 08/18/2017 10:55 AM  Resp 18 08/18/2017 10:55 AM  SpO2 90 % 08/18/2017 10:55 AM  Vitals shown include unvalidated device data.  Last Pain:  Vitals:   08/18/17 1053  TempSrc: Oral  PainSc: 0-No pain         Complications: No apparent anesthesia complications

## 2017-08-18 NOTE — Op Note (Signed)
[facility] Patient Name: Glenda [name] Procedure Date : 08/18/2017 MRN: [id number] Attending MD: Clarene Essex , MD Date of Birth: 10-Apr-1934 CSN: [id number] Age: 84 Admit Type: Outpatient Procedure:                Upper GI endoscopy Indications:              Dysphagia,history of stricture Providers:                Clarene Essex, MD, Cleda Daub, RN, Laurena Spies, Technician, Eligha Bridegroom CRNA, CRNA Referring MD:              Medicines:                Propofol total dose 517 mg IV Complications:            No immediate complications. Estimated Blood Loss:     Estimated blood loss: none. Procedure:                Pre-Anesthesia Assessment:                           - Prior to the procedure, a History and Physical                            was performed, and patient medications and                            allergies were reviewed. The patient's tolerance of                            previous anesthesia was also reviewed. The risks                            and benefits of the procedure and the sedation                            options and risks were discussed with the patient.                            All questions were answered, and informed consent                            was obtained. Prior Anticoagulants: The patient has                            taken Plavix (clopidogrel), last dose was 14 days                            prior to procedure. ASA Grade Assessment: III - A                            patient with severe systemic disease. After  reviewing the risks and benefits, the patient was                            deemed in satisfactory condition to undergo the                            procedure.                           After obtaining informed consent, the endoscope was                            passed under direct vision. Throughout the                            procedure, the patient's  blood pressure, pulse, and                            oxygen saturations were monitored continuously. The                            EG-[id number] ([id number]) scope was introduced through the                            mouth, and advanced to the third part of duodenum.                            The upper GI endoscopy was accomplished without                            difficulty. The patient tolerated the procedure                            well. Scope In: Scope Out: Findings:      The larynx was normal.      A medium-sized hiatal hernia was present.      One benign-appearing, intrinsic severe stenosis was found. The stenosis       was traversed after dilation. A TTS dilator was passed through the       scope. Dilation with an 12-02-08 mm balloon wire-guided without using the       wire and then we were able to pass to scope and then we proceeded at the       end of the procedure with a 12-13.5-15 mm balloon dilator was performed       to 13.5 mm. The dilation site was examined and showed mild mucosal       disruption.      Food was found in the lower third of the esophagus. Removal was       accomplished with a Raptor grasping device.      The entire examined stomach was normal.      The ampulla, duodenal bulb, first portion of the duodenum, second       portion of the duodenum, major papilla and third portion of the duodenum       were normal.      The exam was otherwise without abnormality. Impression:               -  Normal larynx.                           - Medium-sized hiatal hernia.                           - Benign-appearing esophageal stenosis. Dilated.                           - Food was found in the esophagus. Removal was                            successful prior to the dilation.                           - Normal stomach.                           - Normal ampulla, duodenal bulb, first portion of                            the duodenum, second portion of the duodenum, major                             papilla and third portion of the duodenum.                           - The examination was otherwise normal. Recommendation:           - Patient has a contact number available for                            emergencies. The signs and symptoms of potential                            delayed complications were discussed with the                            patient. Return to normal activities tomorrow.                            Written discharge instructions were provided to the                            patient.                           - Soft diet today.may resume aspirin and/or Plavix                            on Saturday if felt needed by primary team                           - Return to GI clinic in 4 weeks.                           -  Telephone GI clinic if symptomatic PRN. Procedure Code(s):        --- Professional ---                           [phone], Esophagogastroduodenoscopy, flexible,                            transoral; with removal of foreign body(s)                           43249, Esophagogastroduodenoscopy, flexible,                            transoral; with transendoscopic balloon dilation of                            esophagus (less than 30 mm diameter) Diagnosis Code(s):        --- Professional ---                           K44.9, Diaphragmatic hernia without obstruction or                            gangrene                           K22.2, Esophageal obstruction                           K27.062B, Food in esophagus causing other injury,                            initial encounter                           R13.10, Dysphagia, unspecified CPT copyright 2017 American [employer] Association. All rights reserved. The codes documented in this report are preliminary and upon coder review may  be revised to meet current compliance requirements. Clarene Essex, MD 08/18/2017 10:59:15 AM This report has been signed electronically. Number of Addenda:  0

## 2017-08-18 NOTE — Anesthesia Postprocedure Evaluation (Signed)
Anesthesia Post Note  Patient: Ernesha Ramone  Procedure(s) Performed: ESOPHAGOGASTRODUODENOSCOPY (EGD) WITH PROPOFOL (N/A ) BALLOON DILATION (N/A )     Patient location during evaluation: Endoscopy Anesthesia Type: MAC Level of consciousness: awake and alert Pain management: pain level controlled Vital Signs Assessment: post-procedure vital signs reviewed and stable Respiratory status: spontaneous breathing, nonlabored ventilation, respiratory function stable and patient connected to nasal cannula oxygen Cardiovascular status: blood pressure returned to baseline and stable Postop Assessment: no apparent nausea or vomiting Anesthetic complications: no    Last Vitals:  Vitals:   08/18/17 1130 08/18/17 1140  BP: (!) 170/59 (!) 165/42  Pulse: 69 70  Resp: 18 20  Temp:    SpO2: (!) 89% 100%    Last Pain:  Vitals:   08/18/17 1140  TempSrc:   PainSc: 0-No pain                 Lenzi Marmo DANIEL

## 2017-08-18 NOTE — Anesthesia Preprocedure Evaluation (Signed)
Anesthesia Evaluation  Patient identified by MRN, date of birth, ID band Patient awake    Reviewed: Allergy & Precautions, H&P , NPO status , Patient's Chart, lab work & pertinent test results, reviewed documented beta blocker date and time   History of Anesthesia Complications Negative for: history of anesthetic complications  Airway Mallampati: III  TM Distance: >3 FB Neck ROM: Full    Dental no notable dental hx. (+) Edentulous Upper, Dental Advisory Given, Partial Lower   Pulmonary neg pulmonary ROS,    Pulmonary exam normal breath sounds clear to auscultation       Cardiovascular hypertension, Pt. on medications and Pt. on home beta blockers + CAD, + Peripheral Vascular Disease and +CHF   Rhythm:Regular Rate:Normal + Systolic murmurs    Neuro/Psych CVA, Residual Symptoms negative psych ROS   GI/Hepatic Neg liver ROS, GERD  ,  Endo/Other  diabetes, Insulin Dependent  Renal/GU CRFRenal disease  negative genitourinary   Musculoskeletal  (+) Arthritis , Osteoarthritis,    Abdominal   Peds  Hematology negative hematology ROS (+) anemia ,   Anesthesia Other Findings   Reproductive/Obstetrics negative OB ROS                             Anesthesia Physical  Anesthesia Plan  ASA: III  Anesthesia Plan: MAC   Post-op Pain Management:    Induction: Intravenous  PONV Risk Score and Plan: 2 and Ondansetron and Propofol infusion  Airway Management Planned: Simple Face Mask  Additional Equipment:   Intra-op Plan:   Post-operative Plan:   Informed Consent: I have reviewed the patients History and Physical, chart, labs and discussed the procedure including the risks, benefits and alternatives for the proposed anesthesia with the patient or authorized representative who has indicated his/her understanding and acceptance.   Dental advisory given  Plan Discussed with:  CRNA  Anesthesia Plan Comments:         Anesthesia Quick Evaluation

## 2017-08-18 NOTE — Progress Notes (Signed)
Patient in Endo recovery s/p EGD with dilation. Patient's O2 sats in high 80's-low 90's on room air. Patient placed back on 1L of O2 with sats in mid 90's. Patient dressed and moved to wheelchair on room and O2 sats in low 90's. Patient discharged with IS and instructions on use. Patient instructed to call with any changes and will be following up with PACE MD tomorrow.

## 2017-08-18 NOTE — Progress Notes (Signed)
Glenda [name] 8:57 AM  Subjective: Patient actually has been doing a little better since we saw her recently in the office but did throw up a little bit of blood and her Plavix has been stopped and her aspirin for 2 weeks and she has no complaints and her case discussed with her daughter as well  Objective: Vital signs stable afebrile no acute distress exam please see preassessment evaluation  Assessment: Dysphasia in a patient with known hiatal hernia and ring  Plan: Okay to proceed with endoscopy with anesthesia assistance today  [facility]  Pager [phone] After 5PM or if no answer call [phone]

## 2017-08-18 NOTE — Discharge Instructions (Signed)
Call if question or problem or if swallowing gets worse otherwise follow-up in one month and okay with me to resume either aspirin or Plavix or both as per primary doctor on Saturday and began with soft solids today

## 2017-08-19 ENCOUNTER — Encounter (HOSPITAL_COMMUNITY): Payer: Self-pay | Admitting: Gastroenterology

## 2017-08-20 ENCOUNTER — Emergency Department (HOSPITAL_COMMUNITY): Payer: Medicare (Managed Care)

## 2017-08-20 ENCOUNTER — Other Ambulatory Visit: Payer: Self-pay

## 2017-08-20 ENCOUNTER — Inpatient Hospital Stay (HOSPITAL_COMMUNITY)
Admission: EM | Admit: 2017-08-20 | Discharge: 2017-09-01 | DRG: 280 | Disposition: A | Discharge status: Home / Self Care | Payer: Medicare (Managed Care) | Attending: Internal Medicine | Admitting: Internal Medicine

## 2017-08-20 ENCOUNTER — Encounter (HOSPITAL_COMMUNITY): Payer: Self-pay | Admitting: *Deleted

## 2017-08-20 DIAGNOSIS — K210 Gastro-esophageal reflux disease with esophagitis: Secondary | ICD-10-CM | POA: Diagnosis not present

## 2017-08-20 DIAGNOSIS — R0602 Shortness of breath: Secondary | ICD-10-CM | POA: Diagnosis present

## 2017-08-20 DIAGNOSIS — IMO0002 Reserved for concepts with insufficient information to code with codable children: Secondary | ICD-10-CM | POA: Diagnosis present

## 2017-08-20 DIAGNOSIS — J810 Acute pulmonary edema: Secondary | ICD-10-CM | POA: Diagnosis not present

## 2017-08-20 DIAGNOSIS — I272 Pulmonary hypertension, unspecified: Secondary | ICD-10-CM | POA: Diagnosis present

## 2017-08-20 DIAGNOSIS — T18128A Food in esophagus causing other injury, initial encounter: Secondary | ICD-10-CM | POA: Diagnosis present

## 2017-08-20 DIAGNOSIS — E1149 Type 2 diabetes mellitus with other diabetic neurological complication: Secondary | ICD-10-CM | POA: Diagnosis present

## 2017-08-20 DIAGNOSIS — I361 Nonrheumatic tricuspid (valve) insufficiency: Secondary | ICD-10-CM | POA: Diagnosis not present

## 2017-08-20 DIAGNOSIS — I132 Hypertensive heart and chronic kidney disease with heart failure and with stage 5 chronic kidney disease, or end stage renal disease: Principal | ICD-10-CM | POA: Diagnosis present

## 2017-08-20 DIAGNOSIS — I10 Essential (primary) hypertension: Secondary | ICD-10-CM | POA: Diagnosis present

## 2017-08-20 DIAGNOSIS — K219 Gastro-esophageal reflux disease without esophagitis: Secondary | ICD-10-CM | POA: Diagnosis present

## 2017-08-20 DIAGNOSIS — N183 Chronic kidney disease, stage 3 (moderate): Secondary | ICD-10-CM | POA: Diagnosis not present

## 2017-08-20 DIAGNOSIS — E785 Hyperlipidemia, unspecified: Secondary | ICD-10-CM | POA: Diagnosis present

## 2017-08-20 DIAGNOSIS — K222 Esophageal obstruction: Secondary | ICD-10-CM | POA: Diagnosis present

## 2017-08-20 DIAGNOSIS — E1165 Type 2 diabetes mellitus with hyperglycemia: Secondary | ICD-10-CM | POA: Diagnosis present

## 2017-08-20 DIAGNOSIS — K449 Diaphragmatic hernia without obstruction or gangrene: Secondary | ICD-10-CM | POA: Diagnosis present

## 2017-08-20 DIAGNOSIS — R1310 Dysphagia, unspecified: Secondary | ICD-10-CM | POA: Diagnosis present

## 2017-08-20 DIAGNOSIS — Z992 Dependence on renal dialysis: Secondary | ICD-10-CM

## 2017-08-20 DIAGNOSIS — E875 Hyperkalemia: Secondary | ICD-10-CM | POA: Diagnosis present

## 2017-08-20 DIAGNOSIS — E11649 Type 2 diabetes mellitus with hypoglycemia without coma: Secondary | ICD-10-CM | POA: Diagnosis not present

## 2017-08-20 DIAGNOSIS — I21A1 Myocardial infarction type 2: Secondary | ICD-10-CM | POA: Diagnosis present

## 2017-08-20 DIAGNOSIS — I5031 Acute diastolic (congestive) heart failure: Secondary | ICD-10-CM | POA: Insufficient documentation

## 2017-08-20 DIAGNOSIS — N186 End stage renal disease: Secondary | ICD-10-CM | POA: Diagnosis present

## 2017-08-20 DIAGNOSIS — Z923 Personal history of irradiation: Secondary | ICD-10-CM

## 2017-08-20 DIAGNOSIS — I2510 Atherosclerotic heart disease of native coronary artery without angina pectoris: Secondary | ICD-10-CM | POA: Diagnosis present

## 2017-08-20 DIAGNOSIS — E1122 Type 2 diabetes mellitus with diabetic chronic kidney disease: Secondary | ICD-10-CM | POA: Diagnosis present

## 2017-08-20 DIAGNOSIS — Z9861 Coronary angioplasty status: Secondary | ICD-10-CM

## 2017-08-20 DIAGNOSIS — D631 Anemia in chronic kidney disease: Secondary | ICD-10-CM | POA: Diagnosis present

## 2017-08-20 DIAGNOSIS — J811 Chronic pulmonary edema: Secondary | ICD-10-CM

## 2017-08-20 DIAGNOSIS — G4733 Obstructive sleep apnea (adult) (pediatric): Secondary | ICD-10-CM | POA: Diagnosis present

## 2017-08-20 DIAGNOSIS — N185 Chronic kidney disease, stage 5: Secondary | ICD-10-CM

## 2017-08-20 DIAGNOSIS — E1129 Type 2 diabetes mellitus with other diabetic kidney complication: Secondary | ICD-10-CM | POA: Diagnosis not present

## 2017-08-20 DIAGNOSIS — N179 Acute kidney failure, unspecified: Secondary | ICD-10-CM | POA: Diagnosis present

## 2017-08-20 DIAGNOSIS — I248 Other forms of acute ischemic heart disease: Secondary | ICD-10-CM | POA: Diagnosis not present

## 2017-08-20 DIAGNOSIS — Z803 Family history of malignant neoplasm of breast: Secondary | ICD-10-CM

## 2017-08-20 DIAGNOSIS — Z7902 Long term (current) use of antithrombotics/antiplatelets: Secondary | ICD-10-CM

## 2017-08-20 DIAGNOSIS — R0902 Hypoxemia: Secondary | ICD-10-CM | POA: Diagnosis not present

## 2017-08-20 DIAGNOSIS — Z8673 Personal history of transient ischemic attack (TIA), and cerebral infarction without residual deficits: Secondary | ICD-10-CM

## 2017-08-20 DIAGNOSIS — J9601 Acute respiratory failure with hypoxia: Secondary | ICD-10-CM | POA: Diagnosis present

## 2017-08-20 DIAGNOSIS — I480 Paroxysmal atrial fibrillation: Secondary | ICD-10-CM

## 2017-08-20 DIAGNOSIS — Z833 Family history of diabetes mellitus: Secondary | ICD-10-CM

## 2017-08-20 DIAGNOSIS — N189 Chronic kidney disease, unspecified: Secondary | ICD-10-CM

## 2017-08-20 DIAGNOSIS — Z7982 Long term (current) use of aspirin: Secondary | ICD-10-CM

## 2017-08-20 DIAGNOSIS — I340 Nonrheumatic mitral (valve) insufficiency: Secondary | ICD-10-CM | POA: Diagnosis present

## 2017-08-20 DIAGNOSIS — Z79899 Other long term (current) drug therapy: Secondary | ICD-10-CM

## 2017-08-20 DIAGNOSIS — D638 Anemia in other chronic diseases classified elsewhere: Secondary | ICD-10-CM | POA: Diagnosis present

## 2017-08-20 DIAGNOSIS — I5033 Acute on chronic diastolic (congestive) heart failure: Secondary | ICD-10-CM | POA: Diagnosis present

## 2017-08-20 DIAGNOSIS — Z853 Personal history of malignant neoplasm of breast: Secondary | ICD-10-CM

## 2017-08-20 DIAGNOSIS — Z794 Long term (current) use of insulin: Secondary | ICD-10-CM

## 2017-08-20 LAB — COMPREHENSIVE METABOLIC PANEL
ALK PHOS: 93 U/L (ref 38–126)
ALT: 9 U/L — ABNORMAL LOW (ref 14–54)
AST: 19 U/L (ref 15–41)
Albumin: 3.2 g/dL — ABNORMAL LOW (ref 3.5–5.0)
Anion gap: 14 (ref 5–15)
BILIRUBIN TOTAL: 0.7 mg/dL (ref 0.3–1.2)
BUN: 55 mg/dL — AB (ref 6–20)
CALCIUM: 8.7 mg/dL — AB (ref 8.9–10.3)
CO2: 22 mmol/L (ref 22–32)
Chloride: 104 mmol/L (ref 101–111)
Creatinine, Ser: 3.65 mg/dL — ABNORMAL HIGH (ref 0.44–1.00)
GFR calc Af Amer: 12 mL/min — ABNORMAL LOW (ref >60)
GFR calc non Af Amer: 11 mL/min — ABNORMAL LOW (ref >60)
GLUCOSE: 362 mg/dL — AB (ref 65–99)
Potassium: 3.9 mmol/L (ref 3.5–5.1)
Sodium: 140 mmol/L (ref 135–145)
TOTAL PROTEIN: 7 g/dL (ref 6.5–8.1)

## 2017-08-20 LAB — TROPONIN I: Troponin I: 0.04 ng/mL (ref <0.03)

## 2017-08-20 LAB — CBC
HCT: 27.6 % — ABNORMAL LOW (ref 36.0–46.0)
Hemoglobin: 8.7 g/dL — ABNORMAL LOW (ref 12.0–15.0)
MCH: 27.8 pg (ref 26.0–34.0)
MCHC: 31.5 g/dL (ref 30.0–36.0)
MCV: 88.2 fL (ref 78.0–100.0)
PLATELETS: 168 10*3/uL (ref 150–400)
RBC: 3.13 MIL/uL — ABNORMAL LOW (ref 3.87–5.11)
RDW: 13.2 % (ref 11.5–15.5)
WBC: 7 10*3/uL (ref 4.0–10.5)

## 2017-08-20 LAB — GLUCOSE, CAPILLARY
GLUCOSE-CAPILLARY: 47 mg/dL — AB (ref 65–99)
Glucose-Capillary: 136 mg/dL — ABNORMAL HIGH (ref 65–99)
Glucose-Capillary: 154 mg/dL — ABNORMAL HIGH (ref 65–99)
Glucose-Capillary: 73 mg/dL (ref 65–99)

## 2017-08-20 LAB — CREATININE, SERUM
CREATININE: 3.43 mg/dL — AB (ref 0.44–1.00)
GFR, EST AFRICAN AMERICAN: 13 mL/min — AB (ref >60)
GFR, EST NON AFRICAN AMERICAN: 12 mL/min — AB (ref >60)

## 2017-08-20 LAB — BRAIN NATRIURETIC PEPTIDE: B Natriuretic Peptide: 755.4 pg/mL — ABNORMAL HIGH (ref 0.0–100.0)

## 2017-08-20 MED ORDER — ISOSORBIDE MONONITRATE ER 30 MG PO TB24
30.0000 mg | ORAL_TABLET | Freq: Every day | ORAL | Status: DC
  Administered 2017-08-20 – 2017-09-01 (×11): 30 mg via ORAL
  Filled 2017-08-20 (×13): qty 1

## 2017-08-20 MED ORDER — HYDRALAZINE HCL 25 MG PO TABS
25.0000 mg | ORAL_TABLET | Freq: Three times a day (TID) | ORAL | Status: DC
  Administered 2017-08-20 – 2017-09-01 (×32): 25 mg via ORAL
  Filled 2017-08-20 (×33): qty 1

## 2017-08-20 MED ORDER — INSULIN ASPART PROT & ASPART (70-30 MIX) 100 UNIT/ML ~~LOC~~ SUSP
22.0000 [IU] | Freq: Every day | SUBCUTANEOUS | Status: DC
  Administered 2017-08-21: 22 [IU] via SUBCUTANEOUS

## 2017-08-20 MED ORDER — INSULIN ASPART PROT & ASPART (70-30 MIX) 100 UNIT/ML ~~LOC~~ SUSP
18.0000 [IU] | Freq: Every day | SUBCUTANEOUS | Status: DC
  Administered 2017-08-20 – 2017-08-21 (×2): 18 [IU] via SUBCUTANEOUS
  Filled 2017-08-20: qty 10

## 2017-08-20 MED ORDER — SODIUM CHLORIDE 0.9% FLUSH
3.0000 mL | Freq: Two times a day (BID) | INTRAVENOUS | Status: DC
  Administered 2017-08-20 – 2017-08-31 (×20): 3 mL via INTRAVENOUS

## 2017-08-20 MED ORDER — INSULIN ASPART 100 UNIT/ML ~~LOC~~ SOLN
0.0000 [IU] | Freq: Three times a day (TID) | SUBCUTANEOUS | Status: DC
  Administered 2017-08-20: 3 [IU] via SUBCUTANEOUS
  Administered 2017-08-20: 2 [IU] via SUBCUTANEOUS
  Administered 2017-08-21: 5 [IU] via SUBCUTANEOUS

## 2017-08-20 MED ORDER — FAMOTIDINE 10 MG PO TABS
10.0000 mg | ORAL_TABLET | Freq: Every day | ORAL | Status: DC | PRN
  Filled 2017-08-20: qty 1

## 2017-08-20 MED ORDER — ONDANSETRON HCL 4 MG/2ML IJ SOLN
4.0000 mg | Freq: Four times a day (QID) | INTRAMUSCULAR | Status: DC | PRN
  Administered 2017-08-23 – 2017-08-24 (×2): 4 mg via INTRAVENOUS
  Filled 2017-08-20 (×2): qty 2

## 2017-08-20 MED ORDER — PANTOPRAZOLE SODIUM 40 MG PO TBEC
40.0000 mg | DELAYED_RELEASE_TABLET | Freq: Every day | ORAL | Status: DC
  Administered 2017-08-20 – 2017-09-01 (×13): 40 mg via ORAL
  Filled 2017-08-20 (×13): qty 1

## 2017-08-20 MED ORDER — PRAVASTATIN SODIUM 40 MG PO TABS
80.0000 mg | ORAL_TABLET | Freq: Every day | ORAL | Status: DC
  Administered 2017-08-20 – 2017-09-01 (×13): 80 mg via ORAL
  Filled 2017-08-20 (×13): qty 2

## 2017-08-20 MED ORDER — NEPRO/CARBSTEADY PO LIQD
237.0000 mL | Freq: Two times a day (BID) | ORAL | Status: DC
  Administered 2017-08-20 – 2017-08-25 (×3): 237 mL via ORAL
  Filled 2017-08-20 (×27): qty 237

## 2017-08-20 MED ORDER — POTASSIUM CHLORIDE CRYS ER 20 MEQ PO TBCR
40.0000 meq | EXTENDED_RELEASE_TABLET | Freq: Every day | ORAL | Status: DC
  Administered 2017-08-20 – 2017-08-22 (×3): 40 meq via ORAL
  Filled 2017-08-20 (×3): qty 2

## 2017-08-20 MED ORDER — HEPARIN SODIUM (PORCINE) 5000 UNIT/ML IJ SOLN
5000.0000 [IU] | Freq: Three times a day (TID) | INTRAMUSCULAR | Status: DC
  Administered 2017-08-20 – 2017-08-21 (×4): 5000 [IU] via SUBCUTANEOUS
  Filled 2017-08-20 (×4): qty 1

## 2017-08-20 MED ORDER — CITALOPRAM HYDROBROMIDE 20 MG PO TABS
20.0000 mg | ORAL_TABLET | Freq: Every day | ORAL | Status: DC
  Administered 2017-08-20 – 2017-09-01 (×13): 20 mg via ORAL
  Filled 2017-08-20 (×13): qty 1

## 2017-08-20 MED ORDER — FUROSEMIDE 10 MG/ML IJ SOLN
60.0000 mg | Freq: Once | INTRAMUSCULAR | Status: AC
  Administered 2017-08-20: 60 mg via INTRAVENOUS
  Filled 2017-08-20: qty 8

## 2017-08-20 MED ORDER — AMLODIPINE BESYLATE 10 MG PO TABS
10.0000 mg | ORAL_TABLET | Freq: Every day | ORAL | Status: DC
  Administered 2017-08-20 – 2017-08-30 (×9): 10 mg via ORAL
  Filled 2017-08-20 (×7): qty 1
  Filled 2017-08-20: qty 2
  Filled 2017-08-20 (×2): qty 1

## 2017-08-20 MED ORDER — ACETAMINOPHEN 500 MG PO TABS
1000.0000 mg | ORAL_TABLET | Freq: Every day | ORAL | Status: DC | PRN
Start: 2017-08-20 — End: 2017-09-01
  Administered 2017-08-20 – 2017-08-29 (×4): 1000 mg via ORAL
  Filled 2017-08-20 (×4): qty 2

## 2017-08-20 MED ORDER — ASPIRIN EC 81 MG PO TBEC
81.0000 mg | DELAYED_RELEASE_TABLET | Freq: Every day | ORAL | Status: DC
  Administered 2017-08-20 – 2017-09-01 (×13): 81 mg via ORAL
  Filled 2017-08-20 (×13): qty 1

## 2017-08-20 MED ORDER — CLONIDINE HCL 0.2 MG PO TABS
0.2000 mg | ORAL_TABLET | Freq: Two times a day (BID) | ORAL | Status: DC
  Administered 2017-08-20 – 2017-08-31 (×21): 0.2 mg via ORAL
  Filled 2017-08-20 (×21): qty 1

## 2017-08-20 MED ORDER — CLOPIDOGREL BISULFATE 75 MG PO TABS
75.0000 mg | ORAL_TABLET | Freq: Every day | ORAL | Status: DC
Start: 2017-08-20 — End: 2017-08-25
  Administered 2017-08-20 – 2017-08-24 (×5): 75 mg via ORAL
  Filled 2017-08-20 (×6): qty 1

## 2017-08-20 MED ORDER — INSULIN LISPRO PROT & LISPRO (50-50 MIX) 100 UNIT/ML KWIKPEN: 18.0000 [IU] | PEN_INJECTOR | Freq: Two times a day (BID) | SUBCUTANEOUS | Status: DC

## 2017-08-20 MED ORDER — FUROSEMIDE 10 MG/ML IJ SOLN
60.0000 mg | Freq: Two times a day (BID) | INTRAMUSCULAR | Status: DC
  Administered 2017-08-20 – 2017-08-22 (×4): 60 mg via INTRAVENOUS
  Filled 2017-08-20 (×4): qty 6

## 2017-08-20 MED ORDER — SIMETHICONE 80 MG PO CHEW: 80.0000 mg | CHEWABLE_TABLET | Freq: Four times a day (QID) | ORAL | Status: DC | PRN

## 2017-08-20 MED ORDER — SODIUM CHLORIDE 0.9% FLUSH
3.0000 mL | INTRAVENOUS | Status: DC | PRN
  Administered 2017-08-21: 3 mL via INTRAVENOUS
  Filled 2017-08-20: qty 3

## 2017-08-20 MED ORDER — SODIUM CHLORIDE 0.9 % IV SOLN: 250.0000 mL | INTRAVENOUS | Status: DC | PRN

## 2017-08-20 NOTE — ED Notes (Signed)
Date and time results received: 08/20/17 1112 (use smartphrase ".now" to insert current time)  Test: Trop Critical Value: 0.04  Name of Provider Notified: Gilford Raid, MD Orders Received? Or Actions Taken?: Actions Taken: Notified EDP

## 2017-08-20 NOTE — ED Provider Notes (Signed)
[facility] DEPT Provider Note   CSN: [id number] Arrival date & time: 08/20/17  0802     History   Chief Complaint No chief complaint on file.   HPI Glenda [name] is a 84 y.o. female.  Pt presents to the ED today with SOB.  She has been sob since yesterday.  She has a hx of CHF and ESRD, stage 5, but is not yet on HD.  She had an AV fistula placed in her left forearm on 4/25 in anticipation for the need for HD.  The pt denies f/c.  She denies cp.     Past Medical History:  Diagnosis Date  . Arthritis   . Breast cancer ([place])   . CAD (coronary artery disease)   . Chest pain   . CHF (congestive heart failure) ([place])   . Chronic kidney disease    stage 4  . Dizziness   . DM (diabetes mellitus) ([place])   . GERD (gastroesophageal reflux disease)   . HLD (hyperlipidemia)   . HTN (hypertension)   . Obesity   . Radiation 09/11/15-10/13/15   42.72 Gy to right breast, 12 Gy boost to right breast  . Shortness of breath dyspnea   . Sleep apnea    no longer uses cpap   . Stroke [facility])    balance issues - walks with walker and uses wheelchair   . UTI (urinary tract infection)     Patient Active Problem List   Diagnosis Date Noted  . Acute renal failure superimposed on chronic kidney disease ([place])   . Acute respiratory failure with hypoxia ([place])   . Hypokalemia   . Hypoxia 02/28/2017  . Chronic kidney disease (CKD), stage IV (severe) ([place]) 01/03/2017  . CHF (congestive heart failure) ([place]) 01/02/2017  . Blood pressure alteration 09/16/2016  . Somnolence, daytime 12/08/2015  . Cerebral thrombosis with cerebral infarction 09/07/2015  . Lacunar infarct, acute ([place])   . Nausea & vomiting 09/05/2015  . Type II diabetes mellitus with neurological manifestations, uncontrolled ([place]) 09/03/2015  . Bilateral lower extremity edema 09/03/2015  . Essential hypertension   . Diabetes mellitus type 2 in nonobese (HCC)   . History of breast cancer   . AKI  (acute kidney injury) ([place])   . Acute blood loss anemia   . Hemiparesis ([place])   . Gait disturbance, post-stroke   . Dysphagia, post-stroke   . Acute ischemic stroke/right cerebellar 08/24/2015  . Breast cancer of upper-outer quadrant of right female breast ([place]) 04/30/2015  . CVA (cerebral infarction) 07/05/2014  . Hyperlipidemia 07/05/2014  . Mild mitral regurgitation by prior echocardiogram 12/20/2011  . Acute on chronic diastolic CHF (congestive heart failure) ([place]) 11/25/2010  . Diabetes mellitus type 1, uncontrolled, insulin dependent ([place]) 02/26/2009  . OBESITY 02/26/2009  . Uncontrolled hypertension 02/26/2009  . CAD (coronary artery disease) 02/26/2009  . GASTROESOPHAGEAL REFLUX DISEASE 02/26/2009    Past Surgical History:  Procedure Laterality Date  . AV FISTULA PLACEMENT Left 07/14/2017   Procedure: Left arm Brachicephalic Fistula Creation;  Surgeon: Serafina Mitchell, MD;  Location: [place] [facility];  Service: Vascular;  Laterality: Left;  . BALLOON DILATION N/A 08/18/2017   Procedure: BALLOON DILATION;  Surgeon: Clarene Essex, MD;  Location: [facility];  Service: Endoscopy;  Laterality: N/A;  . CHOLECYSTECTOMY    . colon polyps; hx    . CORONARY ANGIOPLASTY  02/14/09  . ESOPHAGOGASTRODUODENOSCOPY (EGD) WITH PROPOFOL Left 09/08/2015   Procedure: ESOPHAGOGASTRODUODENOSCOPY (EGD) WITH PROPOFOL;  Surgeon: Clarene Essex, MD;  [facility];  Service: Endoscopy;  Laterality: Left;  . ESOPHAGOGASTRODUODENOSCOPY (EGD) WITH PROPOFOL N/A 08/18/2017   Procedure: ESOPHAGOGASTRODUODENOSCOPY (EGD) WITH PROPOFOL;  Surgeon: Clarene Essex, MD;  Location: [facility];  Service: Endoscopy;  Laterality: N/A;  with flouroscopy  . RADIOACTIVE SEED GUIDED PARTIAL MASTECTOMY WITH AXILLARY SENTINEL LYMPH NODE BIOPSY Right 07/17/2015   Procedure: RADIOACTIVE SEED GUIDED PARTIAL MASTECTOMY WITH AXILLARY SENTINEL LYMPH NODE BIOPSY;  Surgeon: Erroll Luna, MD;  Location: [facility];  Service: General;  Laterality:  Right;     OB History   None      Home Medications    Prior to Admission medications   Medication Sig Start Date End Date Taking? Authorizing Provider  acetaminophen (TYLENOL) 500 MG tablet Take 1,000 mg by mouth daily as needed for moderate pain or headache.   Yes [provider]  amLODipine (NORVASC) 10 MG tablet Take 10 mg by mouth daily.    Yes [provider]  aspirin EC 81 MG tablet Take 81 mg by mouth daily.   Yes [provider]  carvedilol (COREG) 12.5 MG tablet Take 1 tablet (12.5 mg total) by mouth 2 (two) times daily with a meal. 01/06/17  Yes Regalado, Belkys A, MD  cetirizine (ZYRTEC) 10 MG tablet Take 10 mg daily by mouth.   Yes [provider]  citalopram (CELEXA) 20 MG tablet Take 20 mg by mouth daily.   Yes [provider]  cloNIDine (CATAPRES) 0.2 MG tablet Take 0.2 mg by mouth 2 (two) times daily.    Yes [provider]  clopidogrel (PLAVIX) 75 MG tablet Take 75 mg by mouth daily.    Yes [provider]  famotidine (PEPCID AC) 10 MG chewable tablet Chew 10 mg by mouth daily as needed for heartburn.   Yes [provider]  HUMALOG MIX 50/50 KWIKPEN (50-50) 100 UNIT/ML Kwikpen Inject 18-22 Units into the skin 2 (two) times daily. Inject 22 units in the morning and 18 units at bedtime 01/06/17  Yes [provider]  hydrALAZINE (APRESOLINE) 25 MG tablet Take 1 tablet (25 mg total) every 8 (eight) hours by mouth. 03/06/17  Yes Thurnell Lose, MD  isosorbide mononitrate (IMDUR) 30 MG 24 hr tablet Take 1 tablet (30 mg total) daily by mouth. 03/06/17 08/20/17 Yes Thurnell Lose, MD  metolazone (ZAROXOLYN) 2.5 MG tablet Take 2.5 mg by mouth daily.   Yes [provider]  Nutritional Supplements (FEEDING SUPPLEMENT, NEPRO CARB STEADY,) LIQD Take 237 mLs by mouth 2 (two) times daily between meals. 01/06/17  Yes Regalado, Belkys A, MD  pantoprazole (PROTONIX) 40 MG tablet Take 40 mg by mouth  daily.   Yes [provider]  potassium chloride SA (K-DUR,KLOR-CON) 20 MEQ tablet Take 2 tablets (40 mEq total) daily by mouth. 03/07/17  Yes Thurnell Lose, MD  pravastatin (PRAVACHOL) 80 MG tablet Take 80 mg by mouth daily.   Yes [provider]  simethicone (GAS-X) 80 MG chewable tablet Chew 80 mg by mouth every 6 (six) hours as needed for flatulence.   Yes [provider]  torsemide (DEMADEX) 100 MG tablet Take 1 tablet (100 mg total) 2 (two) times daily by mouth. 03/06/17  Yes Thurnell Lose, MD  oxyCODONE-acetaminophen (PERCOCET) 5-325 MG tablet Take 1 tablet by mouth every 8 (eight) hours as needed for severe pain. Patient not taking: Reported on 08/17/2017 07/14/17   Gabriel Earing, PA-C    Family History Family History  Problem Relation Age of  Onset  . Diabetes Mother   . Colon cancer Father        also had lung  . Diabetes Unknown   . Heart attack Unknown   . Diabetes Sister        [name]  . Breast cancer Sister     Social History Social History   Tobacco Use  . Smoking status: Never Smoker  . Smokeless tobacco: Never Used  Substance Use Topics  . Alcohol use: No    Alcohol/week: 0.0 oz  . Drug use: No     Allergies   Patient has no known allergies.   Review of Systems Review of Systems  Respiratory: Positive for shortness of breath.   All other systems reviewed and are negative.    Physical Exam Updated Vital Signs BP (!) 167/72 (BP Location: Right Arm)   Pulse 76   Temp 98.9 F (37.2 C) (Oral)   Resp 20   Ht 5\' 6"  (1.676 m)   Wt 69.4 kg (153 lb)   SpO2 97%   BMI 24.69 kg/m   Physical Exam  Constitutional: She is oriented to person, place, and time. She appears well-developed and well-nourished. She appears distressed.  HENT:  Head: Normocephalic and atraumatic.  Right Ear: External ear normal.  Left Ear: External ear normal.  Nose: Nose normal.  Mouth/Throat: Oropharynx is clear and moist.  Eyes: Pupils  are equal, round, and reactive to light. Conjunctivae and EOM are normal.  Neck: Normal range of motion. Neck supple.  Cardiovascular: Normal rate, regular rhythm, normal heart sounds and intact distal pulses.  Pulmonary/Chest: Accessory muscle usage present. Tachypnea noted. She is in respiratory distress. She has rales.  Abdominal: Soft. Bowel sounds are normal.  Musculoskeletal: Normal range of motion. She exhibits edema.  Left forearm fistula.  Good thrill.  Mildly tender to palpation.  Neurological: She is alert and oriented to person, place, and time.  Skin: Skin is warm. Capillary refill takes less than 2 seconds.  Psychiatric: She has a normal mood and affect. Her behavior is normal. Judgment and thought content normal.  Nursing note and vitals reviewed.    ED Treatments / Results  Labs (all labs ordered are listed, but only abnormal results are displayed) Labs Reviewed  BRAIN NATRIURETIC PEPTIDE - Abnormal; Notable for the following components:      Result Value   B Natriuretic Peptide 755.4 (*)    All other components within normal limits  TROPONIN I - Abnormal; Notable for the following components:   Troponin I 0.04 (*)    All other components within normal limits  COMPREHENSIVE METABOLIC PANEL - Abnormal; Notable for the following components:   Glucose, Bld 362 (*)    BUN 55 (*)    Creatinine, Ser 3.65 (*)    Calcium 8.7 (*)    Albumin 3.2 (*)    ALT 9 (*)    GFR calc non Af Amer 11 (*)    GFR calc Af Amer 12 (*)    All other components within normal limits    EKG EKG Interpretation  Date/Time:  Saturday August 20 2017 08:26:16 EDT Ventricular Rate:  79 PR Interval:    QRS Duration: 93 QT Interval:  473 QTC Calculation: 543 R Axis:   72 Text Interpretation:  Sinus rhythm Borderline repol abnrm, anterolateral leads Prolonged QT interval No significant change since last tracing Confirmed by Isla Pence [phone]) on 08/20/2017 8:47:42 AM   Radiology Dg  Chest Port 1 View  Result Date:  08/20/2017 CLINICAL DATA:  Shortness of breath since yesterday.  Arm pain. EXAM: PORTABLE CHEST 1 VIEW COMPARISON:  March 06, 2017 FINDINGS: Stable mild cardiomegaly. No pneumothorax. Moderate pulmonary edema. No other changes. IMPRESSION: Cardiomegaly and moderate pulmonary edema. Electronically Signed   By: Dorise Bullion III M.D   On: 08/20/2017 09:08    Procedures Procedures (including critical care time)  Medications Ordered in ED Medications  furosemide (LASIX) injection 60 mg (60 mg Intravenous Given 08/20/17 0904)     Initial Impression / Assessment and Plan / ED Course  I have reviewed the triage vital signs and the nursing notes.  Pertinent labs & imaging results that were available during my care of the patient were reviewed by me and considered in my medical decision making (see chart for details).   I did speak with the [facility] nurse who will notify their physician on call.    Pt is much improved after lasix and oxygen.  She is more comfortable breathing in bed.  Pt's renal failure is slightly worse compared to prior.  Elevated troponin likely from ckd and unchanged from prior.  Pt d/w Dr. Wendee Beavers (triad) who will admit.  CRITICAL CARE Performed by: Isla Pence   Total critical care time: 30  minutes  Critical care time was exclusive of separately billable procedures and treating other patients.  Critical care was necessary to treat or prevent imminent or life-threatening deterioration.  Critical care was time spent personally by me on the following activities: development of treatment plan with patient and/or surrogate as well as nursing, discussions with consultants, evaluation of patient's response to treatment, examination of patient, obtaining history from patient or surrogate, ordering and performing treatments and interventions, ordering and review of laboratory studies, ordering and review of radiographic studies, pulse oximetry  and re-evaluation of patient's condition.  Final Clinical Impressions(s) / ED Diagnoses   Final diagnoses:  Acute on chronic diastolic congestive heart failure (HCC)  Acute renal failure superimposed on stage 5 chronic kidney disease, not on chronic dialysis, unspecified acute renal failure type ([place])  Acute respiratory failure with hypoxia [name])    ED Discharge Orders    None       Isla Pence, MD 08/20/17 1131

## 2017-08-20 NOTE — ED Notes (Signed)
ED TO INPATIENT HANDOFF REPORT  Name/Age/Gender Glenda [name] 84 y.o. female  Code Status Code Status History    Date Active Date Inactive Code Status Order ID Comments User Context   02/28/2017 2036 03/06/2017 2108 Full Code [id number]  Florencia Reasons, MD Inpatient   01/02/2017 1343 01/06/2017 1517 Full Code [id number]  Georgette Shell, MD ED   01/02/2017 1222 01/02/2017 1342 Full Code [id number]  Georgette Shell, MD ED   09/05/2015 0031 09/09/2015 1453 Full Code [id number]  Rise Patience, MD Inpatient   08/24/2015 1617 08/28/2015 2113 Full Code [id number]  Samella Parr, NP Inpatient   07/04/2014 1926 07/06/2014 2127 Full Code [id number]  Doree Albee, MD ED      Home/SNF/Other Home  Chief Complaint shorntess of breath   Level of Care/Admitting Diagnosis ED Disposition    ED Disposition Condition [facility] Area: [facility] [[id number]]  Level of Care: Telemetry [5]  Admit to tele based on following criteria: Acute CHF  Diagnosis: Acute diastolic (congestive) heart failure [facility] [[id number]]  Admitting Physician: Velvet Bathe [[id number]]  Attending Physician: Velvet Bathe [[id number]]  Estimated length of stay: 3 - 4 days  Certification:: I certify this patient will need inpatient services for at least 2 midnights  PT Class (Do Not Modify): Inpatient [101]  PT Acc Code (Do Not Modify): Private [1]       Medical History Past Medical History:  Diagnosis Date  . Arthritis   . Breast cancer ([place])   . CAD (coronary artery disease)   . Chest pain   . CHF (congestive heart failure) ([place])   . Chronic kidney disease    stage 4  . Dizziness   . DM (diabetes mellitus) ([place])   . GERD (gastroesophageal reflux disease)   . HLD (hyperlipidemia)   . HTN (hypertension)   . Obesity   . Radiation 09/11/15-10/13/15   42.72 Gy to right breast, 12 Gy boost to right breast  . Shortness of breath dyspnea   . Sleep apnea    no longer uses cpap   . Stroke [facility])     balance issues - walks with walker and uses wheelchair   . UTI (urinary tract infection)     Allergies No Known Allergies  IV Location/Drains/Wounds Patient Lines/Drains/Airways Status   Active Line/Drains/Airways    Name:   Placement date:   Placement time:   Site:   Days:   Peripheral IV 08/20/17 Right;Upper Arm   08/20/17    0836    Arm   less than 1   Fistula / Graft Left Upper arm Arteriovenous fistula   07/14/17    1103    Upper arm   37   Incision (Closed) 07/14/17 Arm Left   07/14/17    1035     37          Labs/Imaging Results for orders placed or performed during the hospital encounter of 08/20/17 (from the past 48 hour(s))  Brain natriuretic peptide     Status: Abnormal   Collection Time: 08/20/17  8:38 AM  Result Value Ref Range   B Natriuretic Peptide 755.4 (H) 0.0 - 100.0 pg/mL    Comment: Performed at [facility], [place] [address], [place], [place] [address]  Troponin I     Status: Abnormal   Collection Time: 08/20/17  8:38 AM  Result Value Ref Range   Troponin I 0.04 (HH) <0.03 ng/mL    Comment: CRITICAL  RESULT CALLED TO, READ BACK BY AND VERIFIED WITH: S.Blaiden Werth RN 08/20/2017 1113 JR Performed at [facility], [place] [address], [place], [place] [address]   Comprehensive metabolic panel     Status: Abnormal   Collection Time: 08/20/17  8:38 AM  Result Value Ref Range   Sodium 140 135 - 145 mmol/L   Potassium 3.9 3.5 - 5.1 mmol/L   Chloride 104 101 - 111 mmol/L   CO2 22 22 - 32 mmol/L   Glucose, Bld 362 (H) 65 - 99 mg/dL   BUN 55 (H) 6 - 20 mg/dL   Creatinine, Ser 3.65 (H) 0.44 - 1.00 mg/dL   Calcium 8.7 (L) 8.9 - 10.3 mg/dL   Total Protein 7.0 6.5 - 8.1 g/dL   Albumin 3.2 (L) 3.5 - 5.0 g/dL   AST 19 15 - 41 U/L   ALT 9 (L) 14 - 54 U/L   Alkaline Phosphatase 93 38 - 126 U/L   Total Bilirubin 0.7 0.3 - 1.2 mg/dL   GFR calc non Af Amer 11 (L) >60 mL/min   GFR calc Af Amer 12 (L) >60 mL/min    Comment: (NOTE) The  eGFR has been calculated using the CKD EPI equation. This calculation has not been validated in all clinical situations. eGFR's persistently <60 mL/min signify possible Chronic Kidney Disease.    Anion gap 14 5 - 15    Comment: Performed at [facility], [place] [address], [place], [place] [address]   Dg Chest Port 1 View  Result Date: 08/20/2017 CLINICAL DATA:  Shortness of breath since yesterday.  Arm pain. EXAM: PORTABLE CHEST 1 VIEW COMPARISON:  March 06, 2017 FINDINGS: Stable mild cardiomegaly. No pneumothorax. Moderate pulmonary edema. No other changes. IMPRESSION: Cardiomegaly and moderate pulmonary edema. Electronically Signed   By: Dorise Bullion III M.D   On: 08/20/2017 09:08    Pending Labs Unresulted Labs (From admission, onward)   Start     Ordered   Signed and Corporate treasurer  Daily,   R     Signed and Held   Signed and Held  CBC  (heparin)  Once,   R    Comments:  Baseline for heparin therapy IF NOT ALREADY DRAWN.  Notify MD if PLT < 100 K.    Signed and Held   Signed and Held  Creatinine, serum  (heparin)  Once,   R    Comments:  Baseline for heparin therapy IF NOT ALREADY DRAWN.    Signed and Held      Vitals/Pain Today's Vitals   08/20/17 1120 08/20/17 1153 08/20/17 1221 08/20/17 1234  BP:  (!) 154/69  (!) 167/72  Pulse: 76 70  77  Resp: 20 18  (!) 25  Temp:      TempSrc:      SpO2: 97% 97%  95%  Weight:      Height:      PainSc:   8      Isolation Precautions No active isolations  Medications Medications  acetaminophen (TYLENOL) tablet 1,000 mg (1,000 mg Oral Given 08/20/17 1223)  amLODipine (NORVASC) tablet 10 mg (has no administration in time range)  aspirin EC tablet 81 mg (has no administration in time range)  citalopram (CELEXA) tablet 20 mg (has no administration in time range)  cloNIDine (CATAPRES) tablet 0.2 mg (has no administration in time range)  clopidogrel (PLAVIX) tablet 75 mg (has no administration  in time range)  Insulin Lispro Prot &  Lispro (HUMALOG 50/50 MIX) (50-50) 100 UNIT/ML Kwikpen 18-22 Units (has no administration in time range)  isosorbide mononitrate (IMDUR) 24 hr tablet 30 mg (has no administration in time range)  feeding supplement (NEPRO CARB STEADY) liquid 237 mL (has no administration in time range)  pantoprazole (PROTONIX) EC tablet 40 mg (has no administration in time range)  potassium chloride SA (K-DUR,KLOR-CON) CR tablet 40 mEq (has no administration in time range)  pravastatin (PRAVACHOL) tablet 80 mg (has no administration in time range)  insulin aspart (novoLOG) injection 0-15 Units (has no administration in time range)  furosemide (LASIX) injection 60 mg (60 mg Intravenous Given 08/20/17 0904)    Mobility walks with person assist

## 2017-08-20 NOTE — ED Triage Notes (Signed)
Pt from triage due to sats 77% RA, family states pt c/o [facility] yesterday then later c/o left arm pain where she has her dialysis fissure, she ha not started dialysis. Pt does not apprear to be in distress upon arrival to room.

## 2017-08-20 NOTE — H&P (Signed)
History and Physical    Glenda [name] VZC:[id number] DOB: February 15, 1934 DOA: 08/20/2017  PCP: Angelica Pou, MD   Reviewed prior discharge summary in EMR  Chief Complaint: sob and doe  HPI: Glenda [name] is a 84 y.o. female with medical history significant of grade 3 diastolic HF presenting with 1 day complaint of worsening sob. The problem has been persistent and gradually getting worse. Activity makes her sob worse. She denies any palpitations or sick contacts. Also admits to cough but non productive and no fevers. Denies any hemoptysis.  ED Course: Pt with cardiomegaly and pulmonary edema on chest x ray with elevated BNP on evaluation. We were subsequently consulted for further evaluation and recommendations.  Review of Systems: As per HPI otherwise 10 point review of systems negative.    Past Medical History:  Diagnosis Date  . Arthritis   . Breast cancer ([place])   . CAD (coronary artery disease)   . Chest pain   . CHF (congestive heart failure) ([place])   . Chronic kidney disease    stage 4  . Dizziness   . DM (diabetes mellitus) ([place])   . GERD (gastroesophageal reflux disease)   . HLD (hyperlipidemia)   . HTN (hypertension)   . Obesity   . Radiation 09/11/15-10/13/15   42.72 Gy to right breast, 12 Gy boost to right breast  . Shortness of breath dyspnea   . Sleep apnea    no longer uses cpap   . Stroke [facility])    balance issues - walks with walker and uses wheelchair   . UTI (urinary tract infection)     Past Surgical History:  Procedure Laterality Date  . AV FISTULA PLACEMENT Left 07/14/2017   Procedure: Left arm Brachicephalic Fistula Creation;  Surgeon: Serafina Mitchell, MD;  Location: [place];  Service: Vascular;  Laterality: Left;  . BALLOON DILATION N/A 08/18/2017   Procedure: BALLOON DILATION;  Surgeon: Clarene Essex, MD;  Location: [place];  Service: Endoscopy;  Laterality: N/A;  . CHOLECYSTECTOMY    . colon polyps; hx    . CORONARY ANGIOPLASTY  02/14/09    . ESOPHAGOGASTRODUODENOSCOPY (EGD) WITH PROPOFOL Left 09/08/2015   Procedure: ESOPHAGOGASTRODUODENOSCOPY (EGD) WITH PROPOFOL;  Surgeon: Clarene Essex, MD;  Location[facility] ENDOSCOPY;  Service: Endoscopy;  Laterality: Left;  . ESOPHAGOGASTRODUODENOSCOPY (EGD) WITH PROPOFOL N/A 08/18/2017   Procedure: ESOPHAGOGASTRODUODENOSCOPY (EGD) WITH PROPOFOL;  Surgeon: Clarene Essex, MD;  Location: [place];  Service: Endoscopy;  Laterality: N/A;  with flouroscopy  . RADIOACTIVE SEED GUIDED PARTIAL MASTECTOMY WITH AXILLARY SENTINEL LYMPH NODE BIOPSY Right 07/17/2015   Procedure: RADIOACTIVE SEED GUIDED PARTIAL MASTECTOMY WITH AXILLARY SENTINEL LYMPH NODE BIOPSY;  Surgeon: Erroll Luna, MD;  Location: [place];  Service: General;  Laterality: Right;     reports that she has never smoked. She has never used smokeless tobacco. She reports that she does not drink alcohol or use drugs.  No Known Allergies  Family History  Problem Relation Age of Onset  . Diabetes Mother   . Colon cancer Father        also had lung  . Diabetes Unknown   . Heart attack Unknown   . Diabetes Sister        [name]  . Breast cancer Sister     Prior to Admission medications   Medication Sig Start Date End Date Taking? Authorizing Provider  acetaminophen (TYLENOL) 500 MG tablet Take 1,000 mg by mouth daily as needed for moderate pain or headache.   Yes [provider]  amLODipine (NORVASC) 10 MG tablet Take 10 mg by mouth daily.    Yes [provider]  aspirin EC 81 MG tablet Take 81 mg by mouth daily.   Yes [provider]  carvedilol (COREG) 12.5 MG tablet Take 1 tablet (12.5 mg total) by mouth 2 (two) times daily with a meal. 01/06/17  Yes Regalado, Belkys A, MD  cetirizine (ZYRTEC) 10 MG tablet Take 10 mg daily by mouth.   Yes [provider]  citalopram (CELEXA) 20 MG tablet Take 20 mg by mouth daily.   Yes [provider]  cloNIDine (CATAPRES) 0.2 MG tablet Take 0.2 mg by mouth 2 (two)  times daily.    Yes [provider]  clopidogrel (PLAVIX) 75 MG tablet Take 75 mg by mouth daily.    Yes [provider]  famotidine (PEPCID AC) 10 MG chewable tablet Chew 10 mg by mouth daily as needed for heartburn.   Yes [provider]  HUMALOG MIX 50/50 KWIKPEN (50-50) 100 UNIT/ML Kwikpen Inject 18-22 Units into the skin 2 (two) times daily. Inject 22 units in the morning and 18 units at bedtime 01/06/17  Yes [provider]  hydrALAZINE (APRESOLINE) 25 MG tablet Take 1 tablet (25 mg total) every 8 (eight) hours by mouth. 03/06/17  Yes Thurnell Lose, MD  isosorbide mononitrate (IMDUR) 30 MG 24 hr tablet Take 1 tablet (30 mg total) daily by mouth. 03/06/17 08/20/17 Yes Thurnell Lose, MD  metolazone (ZAROXOLYN) 2.5 MG tablet Take 2.5 mg by mouth daily.   Yes [provider]  Nutritional Supplements (FEEDING SUPPLEMENT, NEPRO CARB STEADY,) LIQD Take 237 mLs by mouth 2 (two) times daily between meals. 01/06/17  Yes Regalado, Belkys A, MD  pantoprazole (PROTONIX) 40 MG tablet Take 40 mg by mouth daily.   Yes [provider]  potassium chloride SA (K-DUR,KLOR-CON) 20 MEQ tablet Take 2 tablets (40 mEq total) daily by mouth. 03/07/17  Yes Thurnell Lose, MD  pravastatin (PRAVACHOL) 80 MG tablet Take 80 mg by mouth daily.   Yes [provider]  simethicone (GAS-X) 80 MG chewable tablet Chew 80 mg by mouth every 6 (six) hours as needed for flatulence.   Yes [provider]  torsemide (DEMADEX) 100 MG tablet Take 1 tablet (100 mg total) 2 (two) times daily by mouth. 03/06/17  Yes Thurnell Lose, MD  oxyCODONE-acetaminophen (PERCOCET) 5-325 MG tablet Take 1 tablet by mouth every 8 (eight) hours as needed for severe pain. Patient not taking: Reported on 08/17/2017 07/14/17   Gabriel Earing, [place]    Physical Exam: Vitals:   08/20/17 1116 08/20/17 1120 08/20/17 1153 08/20/17 1234  BP:   (!) 154/69 (!) 167/72  Pulse: 76 76  70 77  Resp: (!) 23 20 18  (!) 25  Temp:      TempSrc:      SpO2: 97% 97% 97% 95%  Weight:      Height:        Constitutional: NAD, calm, comfortable Vitals:   08/20/17 1116 08/20/17 1120 08/20/17 1153 08/20/17 1234  BP:   (!) 154/69 (!) 167/72  Pulse: 76 76 70 77  Resp: (!) 23 20 18  (!) 25  Temp:      TempSrc:      SpO2: 97% 97% 97% 95%  Weight:      Height:       Eyes: PERRL, lids and conjunctivae normal ENMT: Mucous membranes are moist. Posterior pharynx clear of any  exudate or lesions.Normal dentition.  Neck: normal, supple, no masses, no thyromegaly Respiratory: decreased breath sounds at bases, no rhales, no wheezes, equal chest rise.  Cardiovascular: Regular rate and rhythm, no murmurs / rubs / gallops. No extremity edema. 2+ pedal pulses. No carotid bruits.  Abdomen: no tenderness, no masses palpated. No hepatosplenomegaly. Bowel sounds positive.  Musculoskeletal: no clubbing / cyanosis. No joint deformity upper and lower extremities.  Skin: no rashes, lesions, ulcers. No induration Neurologic: answers questions appropriately, sensation to light touch in tact. Alert and oriented x 3 Psychiatric: Normal judgment and insight.  Normal mood.    Labs on Admission: I have personally reviewed following labs and imaging studies  CBC: No results for input(s): WBC, NEUTROABS, HGB, HCT, MCV, PLT in the last 168 hours. Basic Metabolic Panel: Recent Labs  Lab 08/20/17 0838  NA 140  K 3.9  CL 104  CO2 22  GLUCOSE 362*  BUN 55*  CREATININE 3.65*  CALCIUM 8.7*   GFR: Estimated Creatinine Clearance: 11.3 mL/min (A) (by C-G formula based on SCr of 3.65 mg/dL (H)). Liver Function Tests: Recent Labs  Lab 08/20/17 0838  AST 19  ALT 9*  ALKPHOS 93  BILITOT 0.7  PROT 7.0  ALBUMIN 3.2*   No results for input(s): LIPASE, AMYLASE in the last 168 hours. No results for input(s): AMMONIA in the last 168 hours. Coagulation Profile: No results for input(s): INR, PROTIME in  the last 168 hours. Cardiac Enzymes: Recent Labs  Lab 08/20/17 0838  TROPONINI 0.04*   BNP (last 3 results) No results for input(s): PROBNP in the last 8760 hours. HbA1C: No results for input(s): HGBA1C in the last 72 hours. CBG: Recent Labs  Lab 08/18/17 0740 08/18/17 1205  GLUCAP 305* 336*   Lipid Profile: No results for input(s): CHOL, HDL, LDLCALC, TRIG, CHOLHDL, LDLDIRECT in the last 72 hours. Thyroid Function Tests: No results for input(s): TSH, T4TOTAL, FREET4, T3FREE, THYROIDAB in the last 72 hours. Anemia Panel: No results for input(s): VITAMINB12, FOLATE, FERRITIN, TIBC, IRON, RETICCTPCT in the last 72 hours. Urine analysis:    Component Value Date/Time   COLORURINE YELLOW 03/01/2017 0100   APPEARANCEUR CLEAR 03/01/2017 0100   LABSPEC 1.011 03/01/2017 0100   PHURINE 5.0 03/01/2017 0100   GLUCOSEU NEGATIVE 03/01/2017 0100   HGBUR NEGATIVE 03/01/2017 0100   BILIRUBINUR NEGATIVE 03/01/2017 0100   KETONESUR NEGATIVE 03/01/2017 0100   PROTEINUR 100 (A) 03/01/2017 0100   UROBILINOGEN 0.2 07/04/2014 1900   NITRITE NEGATIVE 03/01/2017 0100   LEUKOCYTESUR NEGATIVE 03/01/2017 0100    Radiological Exams on Admission: Dg Chest Port 1 View  Result Date: 08/20/2017 CLINICAL DATA:  Shortness of breath since yesterday.  Arm pain. EXAM: PORTABLE CHEST 1 VIEW COMPARISON:  March 06, 2017 FINDINGS: Stable mild cardiomegaly. No pneumothorax. Moderate pulmonary edema. No other changes. IMPRESSION: Cardiomegaly and moderate pulmonary edema. Electronically Signed   By: Dorise Bullion III M.D   On: 08/20/2017 09:08    EKG: Independently reviewed. Normal sinus rhythm with no st elevation or depressions  Assessment/Plan Active Problems:   Acute on chronic diastolic CHF (congestive heart failure) (H[facility]) - will hold B blocker given acute exacerbation - continue imdur - lasix 60 mg IV bid.    Uncontrolled hypertension - amlodipine - clonidine - imdur    CAD (coronary  artery disease)/h/o stroke - continue plavix and statin    GASTROESOPHAGEAL REFLUX DISEASE - stable on ppi    Type II diabetes mellitus with neurological manifestations, uncontrolled ([place]) -  continue home medication regimen - SSI - diabetic diet.    Hypoxia  - secondary to principle problem listed above.  DVT prophylaxis: Heparin Code Status: Full Family Communication:  Disposition Plan:  Consults called:  Admission status:    Velvet Bathe MD [facility] Pager [phone]  If 7PM-7AM, please contact night-coverage [email or web address] Password [facility]  08/20/2017, 12:40 PM

## 2017-08-21 ENCOUNTER — Inpatient Hospital Stay (HOSPITAL_COMMUNITY): Payer: Medicare (Managed Care)

## 2017-08-21 DIAGNOSIS — I361 Nonrheumatic tricuspid (valve) insufficiency: Secondary | ICD-10-CM

## 2017-08-21 DIAGNOSIS — I10 Essential (primary) hypertension: Secondary | ICD-10-CM

## 2017-08-21 DIAGNOSIS — R0902 Hypoxemia: Secondary | ICD-10-CM

## 2017-08-21 DIAGNOSIS — K210 Gastro-esophageal reflux disease with esophagitis: Secondary | ICD-10-CM

## 2017-08-21 LAB — TROPONIN I: Troponin I: 1.73 ng/mL (ref <0.03)

## 2017-08-21 LAB — BASIC METABOLIC PANEL
ANION GAP: 9 (ref 5–15)
BUN: 54 mg/dL — ABNORMAL HIGH (ref 6–20)
CHLORIDE: 107 mmol/L (ref 101–111)
CO2: 26 mmol/L (ref 22–32)
Calcium: 8.7 mg/dL — ABNORMAL LOW (ref 8.9–10.3)
Creatinine, Ser: 3.58 mg/dL — ABNORMAL HIGH (ref 0.44–1.00)
GFR, EST AFRICAN AMERICAN: 13 mL/min — AB (ref >60)
GFR, EST NON AFRICAN AMERICAN: 11 mL/min — AB (ref >60)
Glucose, Bld: 67 mg/dL (ref 65–99)
POTASSIUM: 3.9 mmol/L (ref 3.5–5.1)
SODIUM: 142 mmol/L (ref 135–145)

## 2017-08-21 LAB — ECHOCARDIOGRAM COMPLETE
Height: 66 in
Weight: 2550.28 oz

## 2017-08-21 LAB — GLUCOSE, CAPILLARY
GLUCOSE-CAPILLARY: 85 mg/dL (ref 65–99)
GLUCOSE-CAPILLARY: 230 mg/dL — AB (ref 65–99)
GLUCOSE-CAPILLARY: 101 mg/dL — AB (ref 65–99)
GLUCOSE-CAPILLARY: 37 mg/dL — AB (ref 65–99)
Glucose-Capillary: 75 mg/dL (ref 65–99)

## 2017-08-21 LAB — APTT: aPTT: 37 seconds — ABNORMAL HIGH (ref 24–36)

## 2017-08-21 LAB — PROTIME-INR
INR: 1
PROTHROMBIN TIME: 13.1 s (ref 11.4–15.2)

## 2017-08-21 MED ORDER — PERFLUTREN LIPID MICROSPHERE
1.0000 mL | INTRAVENOUS | Status: AC | PRN
  Filled 2017-08-21: qty 10

## 2017-08-21 MED ORDER — HEPARIN BOLUS VIA INFUSION
1000.0000 [IU] | Freq: Once | INTRAVENOUS | Status: AC
  Administered 2017-08-21: 1000 [IU] via INTRAVENOUS
  Filled 2017-08-21: qty 1000

## 2017-08-21 MED ORDER — METOPROLOL TARTRATE 5 MG/5ML IV SOLN
5.0000 mg | Freq: Once | INTRAVENOUS | Status: AC
  Administered 2017-08-21: 5 mg via INTRAVENOUS
  Filled 2017-08-21: qty 5

## 2017-08-21 MED ORDER — HEPARIN (PORCINE) IN NACL 100-0.45 UNIT/ML-% IJ SOLN
1000.0000 [IU]/h | INTRAMUSCULAR | Status: DC
  Administered 2017-08-21: 900 [IU]/h via INTRAVENOUS
  Administered 2017-08-22: 1000 [IU]/h via INTRAVENOUS
  Filled 2017-08-21 (×2): qty 250

## 2017-08-21 NOTE — Progress Notes (Signed)
Pt had uneventful day. Family at bedside. Py oob in chair and oob to bathroom. Discussed importance of monitoring intake and output. Cont with plan of care. Report given to oncoming RN.  Cont with plan of care

## 2017-08-21 NOTE — Progress Notes (Signed)
ANTICOAGULATION CONSULT NOTE - Initial Consult  Pharmacy Consult for Heparin Indication: chest pain/ACS  No Known Allergies  Patient Measurements: Height: 5\' 6"  (167.6 cm) Weight: 159 lb 6.3 oz (72.3 kg) IBW/kg (Calculated) : 59.3 Heparin Dosing Weight: actual weight  Vital Signs: Temp: 99.6 F (37.6 C) (04/28 1903) Temp Source: Oral (04/28 1533) BP: 145/48 (04/28 1903) Pulse Rate: 74 (04/28 1903)  Labs: Recent Labs    08/20/17 0838 08/20/17 1509 08/21/17 0548 08/21/17 1807  HGB  --  8.7*  --   --   HCT  --  27.6*  --   --   PLT  --  168  --   --   CREATININE 3.65* 3.43* 3.58*  --   TROPONINI 0.04*  --   --  1.73*    Estimated Creatinine Clearance: 12.5 mL/min (A) (by C-G formula based on SCr of 3.58 mg/dL (H)).   Medical History: Past Medical History:  Diagnosis Date  . Arthritis   . Breast cancer ([place])   . CAD (coronary artery disease)   . Chest pain   . CHF (congestive heart failure) ([place])   . Chronic kidney disease    stage 4  . Dizziness   . DM (diabetes mellitus) ([place])   . GERD (gastroesophageal reflux disease)   . HLD (hyperlipidemia)   . HTN (hypertension)   . Obesity   . Radiation 09/11/15-10/13/15   42.72 Gy to right breast, 12 Gy boost to right breast  . Shortness of breath dyspnea   . Sleep apnea    no longer uses cpap   . Stroke [facility])    balance issues - walks with walker and uses wheelchair   . UTI (urinary tract infection)     Medications:  Scheduled:  . amLODipine  10 mg Oral Daily  . aspirin EC  81 mg Oral Daily  . citalopram  20 mg Oral Daily  . cloNIDine  0.2 mg Oral BID  . clopidogrel  75 mg Oral Daily  . feeding supplement (NEPRO CARB STEADY)  237 mL Oral BID BM  . furosemide  60 mg Intravenous BID  . heparin  5,000 Units Subcutaneous Q8H  . hydrALAZINE  25 mg Oral Q8H  . insulin aspart  0-15 Units Subcutaneous TID WC  . insulin aspart protamine- aspart  18 Units Subcutaneous Q supper  . insulin aspart protamine- aspart   22 Units Subcutaneous Q breakfast  . isosorbide mononitrate  30 mg Oral Daily  . pantoprazole  40 mg Oral Daily  . potassium chloride SA  40 mEq Oral Daily  . pravastatin  80 mg Oral Daily  . sodium chloride flush  3 mL Intravenous Q12H   Infusions:  . sodium chloride     PRN: sodium chloride, acetaminophen, famotidine, ondansetron (ZOFRAN) IV, simethicone, sodium chloride flush  Assessment: 84 yo female with hx grade 3 heart failure presents with worsening shortness of breath.  Troponins now elevated and Pharmacy consulted to dose IV heparin for r/o ACS.  Cardiology to consult in AM.   Baseline aPTT, PT/INR pending  CBC: Hgb low 8.7, Plts 168  SCr 3.58, CrCl ~12 ml/min  Received 5000 units SQ heparin at 13:40  Goal of Therapy:  Heparin level 0.3-0.7 units/ml Monitor platelets by anticoagulation protocol: Yes   Plan:   Heparin 1000 units IV bolus  Heparin 900 units/hr IV infusion  Check heparin level in 8hrs  Daily heparin level and CBC  Peggyann Juba, PharmD, BCPS Pager: [phone] 08/21/2017,7:11 PM

## 2017-08-21 NOTE — Progress Notes (Signed)
  Echocardiogram 2D Echocardiogram has been performed.  Merrie Roof F 08/21/2017, 9:35 AM

## 2017-08-21 NOTE — Progress Notes (Addendum)
Patient's Troponin elevated at 1.73 from 0.04 on Admission.  Patient does not complain of any chest pain at this time.  Will order a stat EKG and place patient on a Heparin drip.  Discussed with Cardiology fellow Dr. Georgette Shell who feels it is reasonable to continue the heparin drip at this and cycle cardiac troponins. Dr. Georgette Shell feels like it may be demand ischemia versus an NSTEMI . Cardiology will be by in the morning to evaluate further, however if anything changes overnight they are available to evaluate the patient tonight.

## 2017-08-21 NOTE — Progress Notes (Addendum)
PROGRESS NOTE    Glenda [name]  FOY:[id number] DOB: 27-Mar-1934 DOA: 08/20/2017 PCP: Angelica Pou, MD   Brief Narrative:  Glenda [name] is an 84 y.o. female with medical history significant of grade 3 diastolic HF presenting with 1 day complaint of worsening sob. The problem has been persistent and gradually getting worse. Activity makes her sob worse. She denies any palpitations or sick contacts. Also admits to cough but non productive and no fevers. Denies any hemoptysis.  In the ED Pt had a CXR which showedcardiomegaly and pulmonary edema and elevated BNP on evaluation. She was also found to by hypoxic saturations into the 70s on admission.  She was admitted for acute respiratory failure with hypoxia secondary to acute decompensation of chronic diastolic heart failure.  She is currently getting diuresed and is improving slowly.  Assessment & Plan:   Active Problems:   Uncontrolled hypertension   CAD (coronary artery disease)   GASTROESOPHAGEAL REFLUX DISEASE   Acute on chronic diastolic CHF (congestive heart failure) (HCC)   Type II diabetes mellitus with neurological manifestations, uncontrolled (HCC)   Hypoxia  Acute Respiratory Failure with Hypoxia 2/2 to Acute Decompensated CHF Exacerbation -Was Hypoxic on Admission -Continue with pulse oximetry and maintain O2 saturations greater than 92% -Continue supplemental oxygen as necessary and wean as tolerated -Chest x-ray showed Cardiomegaly and moderate pulmonary edema. -We will need walk screen prior to discharge to assess for home O2  -Repeat CXR in AM.  Acute on Chronic Diastolic CHF -BNP was 767.2 on Admission  -Chest x-ray features with CHF and appears volume overloaded on examination -Holding B blocker given acute exacerbation and will resume once adequately diuresed -Cycle Troponins to rule out new ischemia; POC Troponin was 0.04 but could be in the setting of CKD -Continue blood pressure management -Strict  I's/O's, Daily Weights, SLIV, and Fluid Restriction of 1500 mL -Patient is +423 and Weight is Up 6 lbs since admission -Continue diuresis with IV Lasix 60 mg twice daily -C/w Hydralazine 25 mg every 8 hours and Isosorbide Mononitrate 30 mg p.o. Daily -Repeat Echocardiogram showed EF of 60 to 65% with grade 2 diastolic dysfunction along with elevated central venous pressure in the inferior vena cava -If not improving will discuss with Cardiology  Uncontrolled Hypertension -BP was 167/72 this AM and repeat was 145/54 -Continue with Amlodipine 10 mg p.o. daily, Hydralazine 25 mg p.o. every 8 hours, Isosorbide Mononitrate 30 mg p.o. daily, as well as Clonidine 0.2 mg p.o. twice daily -Continue to monitor blood pressure trends  CAD -Continue with Aspirin 81 mg p.o. daily, Clopidogrel 75 mg p.o. daily, Hydralazine 25 mg every 8, Isosorbide Mononitrate 30 mg p.o. daily, and Pravastatin 80 mg p.o. daily  GERD -Continue with Pantoprazole 40 mg p.o. Daily  Hx of Esophageal Stricture -S/p Dilatation  On 08/18/17  Type II Diabetes Mellitus with Neurological Manifestations, uncontrolled -Continue home medication regimen with NovoLog 70/30 mix 20 units subcu with breakfast and 18 units subcu with supper -Added Moderate NovoLog SSI AC -CBG's ranging from  85-230 -C/w Carb Modified Diet with 1500 mL Fluid Restriction  AKI on CKD Stage 4/5 -Likely worsened from volume overload in the setting of CHF -Baseline creatinine is around 2.5-2.9 -BUN and creatinine on admission was 55/3.65 and is now improved slightly to 54/3.58 -Continue with IV diuresis 60 mg twice daily as above -Avoid nephrotoxic medications possible; patient is not on an ACE or ARB -Getting close to dialysis but is not on it yet.  Has an  AV fistula placed in the left forearm recently (3/21) for the need of Dialysis eventually -Repeat CMP in the a.m.  Normocytic Anemia/Anemia of Chronic Disease -The setting of CKD -Hb/Hct on  Admission was 8.7/27.6 -Check Anemia panel in the a.m. -Continue to monitor for signs and symptoms of bleeding -Repeat CBC in a.m.  Hyperlipidemia -Continue with Pravastatin 80 mg p.o. Daily  History of Breast cancer status post radiation -Currently not an active issue  OSA -No longer using a CPAP  History of a CVA -Continue with aspirin 81 mg p.o. daily, clopidogrel 75 mg p.o. daily, and pravastatin 80 mg p.o. nightly  DVT prophylaxis: Heparin 5,000 units sq q8h Code Status: FULL CODE Family Communication: Discussed with Daughter at bedside Disposition Plan:   Consultants:   None   Procedures:  ECHOCARDIOGRAM ------------------------------------------------------------------- Study Conclusions  - Left ventricle: The cavity size was normal. There was moderate   concentric hypertrophy. Systolic function was normal. The   estimated ejection fraction was in the range of 60% to 65%. Wall   motion was normal; there were no regional wall motion   abnormalities. Features are consistent with a pseudonormal left   ventricular filling pattern, with concomitant abnormal relaxation   and increased filling pressure (grade 2 diastolic dysfunction).   Doppler parameters are consistent with elevated ventricular   end-diastolic filling pressure. - Mitral valve: Calcified annulus. Mildly thickened leaflets .   There was moderate regurgitation. - Left atrium: The atrium was moderately dilated. - Right ventricle: The cavity size was mildly dilated. Wall   thickness was normal. Systolic function was normal. - Tricuspid valve: There was mild regurgitation. - Pulmonary arteries: Systolic pressure was moderately increased.   PA peak pressure: 47 mm Hg (S). - Inferior vena cava: The vessel was dilated. The respirophasic   diameter changes were blunted (< 50%), consistent with elevated   central venous pressure. - Pericardium, extracardiac: There was no pericardial  effusion.   Antimicrobials:  Anti-infectives (From admission, onward)   None     Subjective: Skin examined at bedside and states breathing has improved.  No chest pain, heaviness, dizziness, nausea, vomiting or any other concerns.  Still feels slightly short of breath but much improved since coming in.  No other concerns or complaints at this time.  Objective: Vitals:   08/21/17 0408 08/21/17 0409 08/21/17 1533 08/21/17 1543  BP:  (!) 163/67 (!) 145/54   Pulse:  69 70 76  Resp:  20 16   Temp:  98.3 F (36.8 C) 99 F (37.2 C)   TempSrc:  Oral Oral   SpO2:  97% 91% 98%  Weight: 72.3 kg (159 lb 6.3 oz)     Height:        Intake/Output Summary (Last 24 hours) at 08/21/2017 1715 Last data filed at 08/21/2017 1600 Gross per 24 hour  Intake 723 ml  Output -  Net 723 ml   Filed Weights   08/20/17 0829 08/20/17 1347 08/21/17 0408  Weight: 69.4 kg (153 lb) 72.1 kg (158 lb 15.2 oz) 72.3 kg (159 lb 6.3 oz)   Examination: Physical Exam:  Constitutional: WN/WD elderly AAF in NAD and appears calm and comfortable Eyes: Lids and conjunctivae normal, sclerae anicteric  ENMT: External Ears, Nose appear normal. Grossly normal hearing. Mucous membranes are moist Neck: Appears normal, supple, no cervical masses, normal ROM, no appreciable thyromegaly, some JVD Respiratory: Diminished to auscultation bilaterally with some crackles; no wheezing, rales, rhonchi. Normal respiratory effort and patient is not tachypenic. No  accessory muscle use and was saturating in the low 90s on room air placed on 2 L nasal cannula and went to high 90s Cardiovascular: RRR, no murmurs / rubs / gallops. S1 and S2 auscultated. 1+ LE extremity edema.  Abdomen: Soft, non-tender, Distended due to body habitus. No appreciable hepatosplenomegaly. Bowel sounds positive x3.  GU: Deferred. Musculoskeletal: No clubbing / cyanosis of digits/nails. No joint deformity upper and lower extremities. Has AVF in Left arm.  Skin:  No rashes, lesions, ulcers. No induration; Warm and dry.  Neurologic: CN 2-12 grossly intact with no focal deficits.  Romberg sign and cerebellar reflexes not assessed.  Psychiatric: Normal judgment and insight. Alert and oriented x 3. Normal mood and appropriate affect.   Data Reviewed: I have personally reviewed following labs and imaging studies  CBC: Recent Labs  Lab 08/20/17 1509  WBC 7.0  HGB 8.7*  HCT 27.6*  MCV 88.2  PLT 892   Basic Metabolic Panel: Recent Labs  Lab 08/20/17 0838 08/20/17 1509 08/21/17 0548  NA 140  --  142  K 3.9  --  3.9  CL 104  --  107  CO2 22  --  26  GLUCOSE 362*  --  67  BUN 55*  --  54*  CREATININE 3.65* 3.43* 3.58*  CALCIUM 8.7*  --  8.7*   GFR: Estimated Creatinine Clearance: 12.5 mL/min (A) (by C-G formula based on SCr of 3.58 mg/dL (H)). Liver Function Tests: Recent Labs  Lab 08/20/17 0838  AST 19  ALT 9*  ALKPHOS 93  BILITOT 0.7  PROT 7.0  ALBUMIN 3.2*   No results for input(s): LIPASE, AMYLASE in the last 168 hours. No results for input(s): AMMONIA in the last 168 hours. Coagulation Profile: No results for input(s): INR, PROTIME in the last 168 hours. Cardiac Enzymes: Recent Labs  Lab 08/20/17 0838  TROPONINI 0.04*   BNP (last 3 results) No results for input(s): PROBNP in the last 8760 hours. HbA1C: No results for input(s): HGBA1C in the last 72 hours. CBG: Recent Labs  Lab 08/20/17 2114 08/20/17 2159 08/21/17 0754 08/21/17 1158 08/21/17 1628  GLUCAP 47* 73 85 230* 101*   Lipid Profile: No results for input(s): CHOL, HDL, LDLCALC, TRIG, CHOLHDL, LDLDIRECT in the last 72 hours. Thyroid Function Tests: No results for input(s): TSH, T4TOTAL, FREET4, T3FREE, THYROIDAB in the last 72 hours. Anemia Panel: No results for input(s): VITAMINB12, FOLATE, FERRITIN, TIBC, IRON, RETICCTPCT in the last 72 hours. Sepsis Labs: No results for input(s): PROCALCITON, LATICACIDVEN in the last 168 hours.  No results found  for this or any previous visit (from the past 240 hour(s)).   Radiology Studies: Dg Chest Port 1 View  Result Date: 08/20/2017 CLINICAL DATA:  Shortness of breath since yesterday.  Arm pain. EXAM: PORTABLE CHEST 1 VIEW COMPARISON:  March 06, 2017 FINDINGS: Stable mild cardiomegaly. No pneumothorax. Moderate pulmonary edema. No other changes. IMPRESSION: Cardiomegaly and moderate pulmonary edema. Electronically Signed   By: Dorise Bullion III M.D   On: 08/20/2017 09:08   Scheduled Meds: . amLODipine  10 mg Oral Daily  . aspirin EC  81 mg Oral Daily  . citalopram  20 mg Oral Daily  . cloNIDine  0.2 mg Oral BID  . clopidogrel  75 mg Oral Daily  . feeding supplement (NEPRO CARB STEADY)  237 mL Oral BID BM  . furosemide  60 mg Intravenous BID  . heparin  5,000 Units Subcutaneous Q8H  . hydrALAZINE  25 mg Oral  Q8H  . insulin aspart  0-15 Units Subcutaneous TID WC  . insulin aspart protamine- aspart  18 Units Subcutaneous Q supper  . insulin aspart protamine- aspart  22 Units Subcutaneous Q breakfast  . isosorbide mononitrate  30 mg Oral Daily  . pantoprazole  40 mg Oral Daily  . potassium chloride SA  40 mEq Oral Daily  . pravastatin  80 mg Oral Daily  . sodium chloride flush  3 mL Intravenous Q12H   Continuous Infusions: . sodium chloride      LOS: 1 day   Kerney Elbe, DO [facility] Pager [phone]  If 7PM-7AM, please contact night-coverage [email or web address] Password [facility] 08/21/2017, 5:15 PM

## 2017-08-21 NOTE — Progress Notes (Signed)
Pt. Troponin level= 1.73, pt. denies complaints of chest pain, dizziness, and no shortness of breath noted. MD notified and new orders received. Stat EKG completed

## 2017-08-22 ENCOUNTER — Inpatient Hospital Stay (HOSPITAL_COMMUNITY): Payer: Medicare (Managed Care)

## 2017-08-22 DIAGNOSIS — I2510 Atherosclerotic heart disease of native coronary artery without angina pectoris: Secondary | ICD-10-CM

## 2017-08-22 DIAGNOSIS — I480 Paroxysmal atrial fibrillation: Secondary | ICD-10-CM

## 2017-08-22 DIAGNOSIS — E1165 Type 2 diabetes mellitus with hyperglycemia: Secondary | ICD-10-CM

## 2017-08-22 DIAGNOSIS — I248 Other forms of acute ischemic heart disease: Secondary | ICD-10-CM

## 2017-08-22 DIAGNOSIS — E1149 Type 2 diabetes mellitus with other diabetic neurological complication: Secondary | ICD-10-CM

## 2017-08-22 LAB — CBC WITH DIFFERENTIAL/PLATELET
Basophils Absolute: 0 10*3/uL (ref 0.0–0.1)
Basophils Relative: 0 %
EOS PCT: 0 %
Eosinophils Absolute: 0 10*3/uL (ref 0.0–0.7)
HEMATOCRIT: 26.1 % — AB (ref 36.0–46.0)
Hemoglobin: 8.2 g/dL — ABNORMAL LOW (ref 12.0–15.0)
LYMPHS PCT: 28 %
Lymphs Abs: 2.2 10*3/uL (ref 0.7–4.0)
MCH: 27.6 pg (ref 26.0–34.0)
MCHC: 31.4 g/dL (ref 30.0–36.0)
MCV: 87.9 fL (ref 78.0–100.0)
MONO ABS: 0.8 10*3/uL (ref 0.1–1.0)
MONOS PCT: 10 %
NEUTROS ABS: 4.9 10*3/uL (ref 1.7–7.7)
Neutrophils Relative %: 62 %
PLATELETS: 171 10*3/uL (ref 150–400)
RBC: 2.97 MIL/uL — ABNORMAL LOW (ref 3.87–5.11)
RDW: 13.5 % (ref 11.5–15.5)
WBC: 8 10*3/uL (ref 4.0–10.5)

## 2017-08-22 LAB — HEPARIN LEVEL (UNFRACTIONATED)
HEPARIN UNFRACTIONATED: 0.37 [IU]/mL (ref 0.30–0.70)
Heparin Unfractionated: 0.23 IU/mL — ABNORMAL LOW (ref 0.30–0.70)

## 2017-08-22 LAB — RETICULOCYTES
RBC.: 2.97 MIL/uL — AB (ref 3.87–5.11)
RETIC COUNT ABSOLUTE: 83.2 10*3/uL (ref 19.0–186.0)
Retic Ct Pct: 2.8 % (ref 0.4–3.1)

## 2017-08-22 LAB — COMPREHENSIVE METABOLIC PANEL
ALT: 13 U/L — ABNORMAL LOW (ref 14–54)
ANION GAP: 8 (ref 5–15)
AST: 33 U/L (ref 15–41)
Albumin: 2.8 g/dL — ABNORMAL LOW (ref 3.5–5.0)
Alkaline Phosphatase: 82 U/L (ref 38–126)
BILIRUBIN TOTAL: 0.8 mg/dL (ref 0.3–1.2)
BUN: 53 mg/dL — AB (ref 6–20)
CO2: 24 mmol/L (ref 22–32)
Calcium: 8.5 mg/dL — ABNORMAL LOW (ref 8.9–10.3)
Chloride: 105 mmol/L (ref 101–111)
Creatinine, Ser: 3.56 mg/dL — ABNORMAL HIGH (ref 0.44–1.00)
GFR, EST AFRICAN AMERICAN: 13 mL/min — AB (ref >60)
GFR, EST NON AFRICAN AMERICAN: 11 mL/min — AB (ref >60)
Glucose, Bld: 44 mg/dL — CL (ref 65–99)
POTASSIUM: 4.5 mmol/L (ref 3.5–5.1)
Sodium: 137 mmol/L (ref 135–145)
TOTAL PROTEIN: 6.5 g/dL (ref 6.5–8.1)

## 2017-08-22 LAB — PHOSPHORUS: PHOSPHORUS: 2.5 mg/dL (ref 2.5–4.6)

## 2017-08-22 LAB — VITAMIN B12: Vitamin B-12: 206 pg/mL (ref 180–914)

## 2017-08-22 LAB — IRON AND TIBC
Iron: 13 ug/dL — ABNORMAL LOW (ref 28–170)
Saturation Ratios: 7 % — ABNORMAL LOW (ref 10.4–31.8)
TIBC: 175 ug/dL — ABNORMAL LOW (ref 250–450)
UIBC: 162 ug/dL

## 2017-08-22 LAB — FERRITIN: Ferritin: 672 ng/mL — ABNORMAL HIGH (ref 11–307)

## 2017-08-22 LAB — GLUCOSE, CAPILLARY
GLUCOSE-CAPILLARY: 129 mg/dL — AB (ref 65–99)
GLUCOSE-CAPILLARY: 193 mg/dL — AB (ref 65–99)
GLUCOSE-CAPILLARY: 45 mg/dL — AB (ref 65–99)
GLUCOSE-CAPILLARY: 88 mg/dL (ref 65–99)
Glucose-Capillary: 78 mg/dL (ref 65–99)
Glucose-Capillary: 217 mg/dL — ABNORMAL HIGH (ref 65–99)
Glucose-Capillary: 173 mg/dL — ABNORMAL HIGH (ref 65–99)

## 2017-08-22 LAB — MAGNESIUM: MAGNESIUM: 1.5 mg/dL — AB (ref 1.7–2.4)

## 2017-08-22 LAB — TROPONIN I
TROPONIN I: 1.48 ng/mL — AB (ref <0.03)
Troponin I: 1.56 ng/mL (ref <0.03)

## 2017-08-22 LAB — FOLATE: FOLATE: 14.6 ng/mL (ref >5.9)

## 2017-08-22 MED ORDER — INSULIN ASPART 100 UNIT/ML ~~LOC~~ SOLN
0.0000 [IU] | Freq: Three times a day (TID) | SUBCUTANEOUS | Status: DC
  Administered 2017-08-22: 2 [IU] via SUBCUTANEOUS
  Administered 2017-08-22: 3 [IU] via SUBCUTANEOUS
  Administered 2017-08-23: 2 [IU] via SUBCUTANEOUS
  Administered 2017-08-24: 3 [IU] via SUBCUTANEOUS
  Administered 2017-08-24: 2 [IU] via SUBCUTANEOUS
  Administered 2017-08-24: 3 [IU] via SUBCUTANEOUS
  Administered 2017-08-25 – 2017-08-26 (×2): 7 [IU] via SUBCUTANEOUS
  Administered 2017-08-26: 5 [IU] via SUBCUTANEOUS
  Administered 2017-08-27: 2 [IU] via SUBCUTANEOUS
  Administered 2017-08-27: 1 [IU] via SUBCUTANEOUS
  Administered 2017-08-27: 2 [IU] via SUBCUTANEOUS
  Administered 2017-08-28: 5 [IU] via SUBCUTANEOUS
  Administered 2017-08-28: 2 [IU] via SUBCUTANEOUS
  Administered 2017-08-30: 3 [IU] via SUBCUTANEOUS
  Administered 2017-08-30: 2 [IU] via SUBCUTANEOUS
  Administered 2017-08-30: 5 [IU] via SUBCUTANEOUS
  Administered 2017-08-31: 1 [IU] via SUBCUTANEOUS
  Administered 2017-08-31: 5 [IU] via SUBCUTANEOUS
  Administered 2017-08-31: 3 [IU] via SUBCUTANEOUS

## 2017-08-22 MED ORDER — INSULIN DETEMIR 100 UNIT/ML ~~LOC~~ SOLN
10.0000 [IU] | Freq: Two times a day (BID) | SUBCUTANEOUS | Status: DC
  Administered 2017-08-22 (×2): 10 [IU] via SUBCUTANEOUS
  Filled 2017-08-22 (×3): qty 0.1

## 2017-08-22 MED ORDER — HEPARIN BOLUS VIA INFUSION
1000.0000 [IU] | Freq: Once | INTRAVENOUS | Status: AC
  Administered 2017-08-22: 1000 [IU] via INTRAVENOUS
  Filled 2017-08-22: qty 1000

## 2017-08-22 MED ORDER — INSULIN ASPART 100 UNIT/ML ~~LOC~~ SOLN
3.0000 [IU] | Freq: Three times a day (TID) | SUBCUTANEOUS | Status: DC
  Administered 2017-08-22 – 2017-08-31 (×19): 3 [IU] via SUBCUTANEOUS

## 2017-08-22 MED ORDER — FUROSEMIDE 10 MG/ML IJ SOLN
80.0000 mg | Freq: Three times a day (TID) | INTRAMUSCULAR | Status: DC
  Administered 2017-08-22 – 2017-08-23 (×4): 80 mg via INTRAVENOUS
  Filled 2017-08-22 (×4): qty 8

## 2017-08-22 MED ORDER — MAGNESIUM SULFATE 2 GM/50ML IV SOLN
2.0000 g | Freq: Once | INTRAVENOUS | Status: AC
  Administered 2017-08-22: 2 g via INTRAVENOUS
  Filled 2017-08-22: qty 50

## 2017-08-22 NOTE — Consult Note (Signed)
Renal Service Consult Note [facility]  Glenda [name] 08/22/2017 Glenda [name] Requesting Physician:  Dr Alfredia Ferguson  Reason for Consult:  Acute on CRF HPI: The patient is a 84 y.o. year-old with hx of cva hypertension dm2 chf cad and breast cancer, ckd f/b dr Hollie Salk [facility] pres[facility]ned on 4/27 to ed with sob and low spo2 77%, cxr showed pulm edema and pt admitted as acute on chron diast chf started iv lasix 60 bid. Also uncont hypertension and continued on home bp meds. Has had 4 doses iv lasix and uop is not great baseline creat is 2.4- 2.8 in late 2018, here creat is 3.5. Asked to see for acute chron renal failure w pulm edema.   Pt is up in the chair, says her breathing is much better since admit on Saturday is not making a lot of urine and the daughter corroborates this, no cough or fevers no leg edema no nausea or metallic taste and no severe fatigue.     Home meds  norvasc 10/ coreg 12.5 bid/ clon 0.2 bid/ hydral 25 tid/ zarox 2.5 qd/ torsemide 100 bid  ecasa/ plavix/ imdur 30/ kdur/ statin  percocet prn/ celexa 20  humalog 50/50 bid  prns/ vitmains  ROS  denies CP  no joint pain   no HA  no blurry vision  no rash  no diarrhea  no nausea/ vomiting   Past Medical History  Past Medical History:  Diagnosis Date  . Arthritis   . Breast cancer ([place])   . CAD (coronary artery disease)   . Chest pain   . CHF (congestive heart failure) ([place])   . Chronic kidney disease    stage 4  . Dizziness   . DM (diabetes mellitus) ([place])   . GERD (gastroesophageal reflux disease)   . HLD (hyperlipidemia)   . HTN (hypertension)   . Obesity   . Radiation 09/11/15-10/13/15   42.72 Gy to right breast, 12 Gy boost to right breast  . Shortness of breath dyspnea   . Sleep apnea    no longer uses cpap   . Stroke [name])    balance issues - walks with walker and uses wheelchair   . UTI (urinary tract infection)    Past Surgical History  Past Surgical History:  Procedure  Laterality Date  . AV FISTULA PLACEMENT Left 07/14/2017   Procedure: Left arm Brachicephalic Fistula Creation;  Surgeon: Serafina Mitchell, MD;  Location: [place];  Service: Vascular;  Laterality: Left;  . BALLOON DILATION N/A 08/18/2017   Procedure: BALLOON DILATION;  Surgeon: Clarene Essex, MD;  Location: [place];  Service: Endoscopy;  Laterality: N/A;  . CHOLECYSTECTOMY    . colon polyps; hx    . CORONARY ANGIOPLASTY  02/14/09  . ESOPHAGOGASTRODUODENOSCOPY (EGD) WITH PROPOFOL Left 09/08/2015   Procedure: ESOPHAGOGASTRODUODENOSCOPY (EGD) WITH PROPOFOL;  Surgeon: Clarene Essex, MD;  [facility] ENDOSCOPY;  Service: Endoscopy;  Laterality: Left;  . ESOPHAGOGASTRODUODENOSCOPY (EGD) WITH PROPOFOL N/A 08/18/2017   Procedure: ESOPHAGOGASTRODUODENOSCOPY (EGD) WITH PROPOFOL;  Surgeon: Clarene Essex, MD;  Location: [place];  Service: Endoscopy;  Laterality: N/A;  with flouroscopy  . RADIOACTIVE SEED GUIDED PARTIAL MASTECTOMY WITH AXILLARY SENTINEL LYMPH NODE BIOPSY Right 07/17/2015   Procedure: RADIOACTIVE SEED GUIDED PARTIAL MASTECTOMY WITH AXILLARY SENTINEL LYMPH NODE BIOPSY;  Surgeon: Erroll Luna, MD;  Location: [facility] OR;  Service: General;  Laterality: Right;   Family History  Family History  Problem Relation Age of Onset  . Diabetes Mother   .  Colon cancer Father        also had lung  . Diabetes Unknown   . Heart attack Unknown   . Diabetes Sister        [name]  . Breast cancer Sister    Social History  reports that she has never smoked. She has never used smokeless tobacco. She reports that she does not drink alcohol or use drugs. Allergies No Known Allergies Home medications Prior to Admission medications   Medication Sig Start Date End Date Taking? Authorizing Provider  acetaminophen (TYLENOL) 500 MG tablet Take 1,000 mg by mouth daily as needed for moderate pain or headache.   Yes [provider]  amLODipine (NORVASC) 10 MG tablet Take 10 mg by mouth daily.    Yes [provider]  aspirin EC 81 MG tablet Take 81 mg by mouth daily.   Yes [provider]  carvedilol (COREG) 12.5 MG tablet Take 1 tablet (12.5 mg total) by mouth 2 (two) times daily with a meal. 01/06/17  Yes Regalado, Belkys A, MD  cetirizine (ZYRTEC) 10 MG tablet Take 10 mg daily by mouth.   Yes [provider]  citalopram (CELEXA) 20 MG tablet Take 20 mg by mouth daily.   Yes [provider]  cloNIDine (CATAPRES) 0.2 MG tablet Take 0.2 mg by mouth 2 (two) times daily.    Yes [provider]  clopidogrel (PLAVIX) 75 MG tablet Take 75 mg by mouth daily.    Yes [provider]  famotidine (PEPCID AC) 10 MG chewable tablet Chew 10 mg by mouth daily as needed for heartburn.   Yes [provider]  HUMALOG MIX 50/50 KWIKPEN (50-50) 100 UNIT/ML Kwikpen Inject 18-22 Units into the skin 2 (two) times daily. Inject 22 units in the morning and 18 units at bedtime 01/06/17  Yes [provider]  hydrALAZINE (APRESOLINE) 25 MG tablet Take 1 tablet (25 mg total) every 8 (eight) hours by mouth. 03/06/17  Yes Thurnell Lose, MD  isosorbide mononitrate (IMDUR) 30 MG 24 hr tablet Take 1 tablet (30 mg total) daily by mouth. 03/06/17 08/20/17 Yes Thurnell Lose, MD  metolazone (ZAROXOLYN) 2.5 MG tablet Take 2.5 mg by mouth daily.   Yes [provider]  Nutritional Supplements (FEEDING SUPPLEMENT, NEPRO CARB STEADY,) LIQD Take 237 mLs by mouth 2 (two) times daily between meals. 01/06/17  Yes Regalado, Belkys A, MD  pantoprazole (PROTONIX) 40 MG tablet Take 40 mg by mouth daily.   Yes [provider]  potassium chloride SA (K-DUR,KLOR-CON) 20 MEQ tablet Take 2 tablets (40 mEq total) daily by mouth. 03/07/17  Yes Thurnell Lose, MD  pravastatin (PRAVACHOL) 80 MG tablet Take 80 mg by mouth daily.   Yes [provider]  simethicone (GAS-X) 80 MG chewable tablet Chew 80 mg by mouth every 6 (six) hours as needed for flatulence.    Yes [provider]  torsemide (DEMADEX) 100 MG tablet Take 1 tablet (100 mg total) 2 (two) times daily by mouth. 03/06/17  Yes Thurnell Lose, MD  oxyCODONE-acetaminophen (PERCOCET) 5-325 MG tablet Take 1 tablet by mouth every 8 (eight) hours as needed for severe pain. Patient not taking: Reported on 08/17/2017 07/14/17   Natividad Brood   Liver Function Tests Recent Labs  Lab 08/20/17 0300 08/22/17 0521  AST 19 33  ALT 9* 13*  ALKPHOS 93 82  BILITOT 0.7 0.8  PROT 7.0 6.5  ALBUMIN 3.2* 2.8*   No results  for input(s): LIPASE, AMYLASE in the last 168 hours. CBC Recent Labs  Lab 08/20/17 1509 08/22/17 0521  WBC 7.0 8.0  NEUTROABS  --  4.9  HGB 8.7* 8.2*  HCT 27.6* 26.1*  MCV 88.2 87.9  PLT 168 031   Basic Metabolic Panel Recent Labs  Lab 08/20/17 0838 08/20/17 1509 08/21/17 0548 08/22/17 0521  NA 140  --  142 137  K 3.9  --  3.9 4.5  CL 104  --  107 105  CO2 22  --  26 24  GLUCOSE 362*  --  67 44*  BUN 55*  --  54* 53*  CREATININE 3.65* 3.43* 3.58* 3.56*  CALCIUM 8.7*  --  8.7* 8.5*  PHOS  --   --   --  2.5   Iron/TIBC/Ferritin/ %Sat    Component Value Date/Time   IRON 13 (L) 08/22/2017 0521   TIBC 175 (L) 08/22/2017 0521   FERRITIN 672 (H) 08/22/2017 0521   IRONPCTSAT 7 (L) 08/22/2017 0521    Vitals:   08/21/17 2153 08/21/17 2246 08/22/17 0607 08/22/17 1302  BP: (!) 176/63 113/61 (!) 145/62 (!) 128/52  Pulse: 92 76 71 (!) 59  Resp: (!) 30  (!) 22   Temp: 99.4 F (37.4 C)  98 F (36.7 C) 97.9 F (36.6 C)  TempSrc: Oral  Oral Oral  SpO2: 100%  96% 92%  Weight:      Height:       Exam Gen eldarly aaf no distress up in chair No rash, cyanosis or gangrene Sclera anicteric, throat clear  No jvd or bruits Chest clear bilat to bases no rales or wheezing RRR soft 2/6 sem no s3 Abd soft ntnd no mass or ascites +bs GU defer MS no joint effusions or deformity Ext no leg edema, no wounds or ulcers Neuro is alert, Ox 3 ,  nf     Impression: 1 acute on ckd 4 - vs progression of disease not really looking uremic though 2 pulm edema not diuresing but she is better clinically and not vol overloaded on exam, wonder if she ischemic episode causing pulm edema 3 dm2 on insulin 4 hypertension on mult bp meds and 2 diuretics 5 hx cva 6 hx diast chf echo here shows normal lvef   Plan - repeat cxr, if still wet ^diuretics, has avf for dialysis but not mature yet and no strong indication yet for dialysis will follow  Kelly Splinter MD [employer] pager [phone]   08/22/2017, 4:21 PM

## 2017-08-22 NOTE — Progress Notes (Signed)
PROGRESS NOTE    Glenda [name]  AJO:[id number] DOB: 11/02/1933 DOA: 08/20/2017 PCP: Angelica Pou, MD   Brief Narrative:  Glenda [name] is an 84 y.o. female with medical history significant of grade 3 diastolic HF presenting with 1 day complaint of worsening sob. The problem has been persistent and gradually getting worse. Activity makes her sob worse. She denies any palpitations or sick contacts. Also admits to cough but non productive and no fevers. Denies any hemoptysis.  In the ED Pt had a CXR which showedcardiomegaly and pulmonary edema and elevated BNP on evaluation. She was also found to by hypoxic saturations into the 70s on admission.  She was admitted for acute respiratory failure with hypoxia secondary to acute decompensation of chronic diastolic heart failure.  She is currently getting diuresed and is improving slowly.  Her troponin increased and was placed on a heparin drip.  Cardiology was consulted for further evaluation and recommended continuing heparin drip for 48 hours.  They believe this is demand ischemia and are recommending cardiac catheterization at a later date.  Cardiology also increased diuresis and Nephrology was also consulted for further evaluation of acute on chronic kidney disease.  Patient went to atrial fibrillation briefly yesterday however has not converted back to normal sinus rhythm.  Assessment & Plan:   Active Problems:   Uncontrolled hypertension   CAD (coronary artery disease)   GASTROESOPHAGEAL REFLUX DISEASE   Acute on chronic diastolic CHF (congestive heart failure) (HCC)   Type II diabetes mellitus with neurological manifestations, uncontrolled (HCC)   Hypoxia  Acute Respiratory Failure with Hypoxia 2/2 to Acute Decompensated CHF Exacerbation -Was Hypoxic on Admission -Continue with pulse oximetry and maintain O2 saturations greater than 92% -Continue supplemental oxygen as necessary and wean as tolerated -We will need walk screen  prior to discharge to assess for home O2  -Repeat CXR showed low lung volumes with cardiomegaly and vascular congestion with minimal edema.  There is also small pleural effusions. -Cardiology increase diuresis to 80 mg IV 3 times daily  Acute on Chronic Diastolic CHF -BNP was 947.0 on Admission  -Chest x-ray features with CHF and appears volume overloaded on examination -Holding B blocker given acute exacerbation and will resume once adequately diuresed -Cycle Troponins to rule out new ischemia; POC Troponin was 0.04 but could be in the setting of CKD -Continue blood pressure management -Strict I's/O's, Daily Weights, SLIV, and Fluid Restriction of 1500 mL -Patient is +1.221 L and Weight is Up 6 lbs since admission -Cardiology increased diuresis with IV Lasix 60 mg twice daily to IV 80 mg 3 times daily -C/w Hydralazine 25 mg every 8 hours and Isosorbide Mononitrate 30 mg p.o. Daily -Repeat Echocardiogram showed EF of 60 to 65% with grade 2 diastolic dysfunction along with elevated central venous pressure in the inferior vena cava -Cardiology consult and appreciate recommendations  Elevated Troponins/Type II NSTEMI -Troponin peaked at 1.73 now trending down to 1.56 -Cardiology consulted for further evaluation and recommendations -We will continue her aspirin, Plavix, beta blockers been held due to acute diastolic dysfunction -She was placed on a heparin drip and will continue for 48 hours per cardiology recommendations -Recommending outpatient catheterization at a later date  Paroxysmal Atrial Fibrillation -Brief episode of atrial fibrillation this morning is now back in sinus rhythm. -Continue anticoagulation with heparin drip. -Per cardiology if she continues to go back into atrial fibrillation will need long-term anticoagulation with warfarin she is getting close to dialysis.  -Currently on IV heparin, aspirin,  Plavix.  If it returns she will be placed on warfarin at that time and  Plavix and aspirin will be discontinued per Cardiology recommendations  Uncontrolled Hypertension -BP was 176/63 earlier and then improved to 128/52 -Continue with Amlodipine 10 mg p.o. daily, Hydralazine 25 mg p.o. every 8 hours, Isosorbide Mononitrate 30 mg p.o. daily, as well as Clonidine 0.2 mg p.o. twice daily -Continue to monitor blood pressure trends  CAD -Continue with Aspirin 81 mg p.o. daily, Clopidogrel 75 mg p.o. daily, Hydralazine 25 mg every 8, Isosorbide Mononitrate 30 mg p.o. daily, and Pravastatin 80 mg p.o. Daily -Troponin elevated to 1.73 -Echo showed EF of 60 to 65% with no wall motion abnormality -Cardiology consulted and recommended continue heparin drip though started last night for elevated troponins for at least 48 hours  GERD -Continue with Pantoprazole 40 mg p.o. Daily  Hx of Esophageal Stricture -S/p Dilatation  On 08/18/17  Type II Diabetes Mellitus with Neurological Manifestations, uncontrolled hypoglycemia -Discontinued home medication regimen with NovoLog 70/30 mix 20 units subcu with breakfast and 18 units subcu with supper -Added Moderate NovoLog SSI AC yesterday but will decrease to central scale; Per Diabetes coordinator commendations will add Levemir 10 units twice daily, NovoLog 3 units 3 times daily no coverage if she eats greater than 50%  -CBG's ranging from 37-193 -C/w Carb Modified Diet with 1500 mL Fluid Restriction  AKI on CKD Stage 4/5 -Likely worsened from volume overload in the setting of CHF -Baseline creatinine is around 2.5-2.9 -BUN and creatinine on admission was 55/3.65 and is now improved slightly to 53/3.56 -Increased IV diuresis 60 mg twice daily as above to 80 mg IV twice daily cardiology -Avoid nephrotoxic medications possible; patient is not on an ACE or ARB -Getting close to dialysis but is not on it yet.  Has an AV fistula placed in the left forearm recently (3/21) for the need of Dialysis eventually -Repeat CMP in the  a.m. -Nephrology Dr. service consulted recommending repeating chest x-ray and increasing diuretics.  Feels she has no strong indication for dialysis yet but will continue to follow  Normocytic Anemia/Anemia of Chronic Disease -In the setting of CKD -Hb/Hct on Admission was 8.7/27.6 is now 8.2/26.1 -Check Anemia panel in the a.m. -Continue to monitor for signs and symptoms of bleeding -Repeat CBC in a.m.  Hyperlipidemia -Continue with Pravastatin 80 mg p.o. Daily  History of Breast cancer status post radiation -Currently not an active issue  OSA -No longer using a CPAP  History of a CVA -Continue with aspirin 81 mg p.o. daily, clopidogrel 75 mg p.o. daily, and pravastatin 80 mg p.o. nightly  DVT prophylaxis: Heparin 5,000 units sq q8h Code Status: FULL CODE Family Communication: Discussed with Daughter at bedside Disposition Plan: Remain inpatient with current treatment and will get PT evaluation prior to discharge  Consultants:   None   Procedures:  ECHOCARDIOGRAM ------------------------------------------------------------------- Study Conclusions  - Left ventricle: The cavity size was normal. There was moderate   concentric hypertrophy. Systolic function was normal. The   estimated ejection fraction was in the range of 60% to 65%. Wall   motion was normal; there were no regional wall motion   abnormalities. Features are consistent with a pseudonormal left   ventricular filling pattern, with concomitant abnormal relaxation   and increased filling pressure (grade 2 diastolic dysfunction).   Doppler parameters are consistent with elevated ventricular   end-diastolic filling pressure. - Mitral valve: Calcified annulus. Mildly thickened leaflets .   There was moderate  regurgitation. - Left atrium: The atrium was moderately dilated. - Right ventricle: The cavity size was mildly dilated. Wall   thickness was normal. Systolic function was normal. - Tricuspid valve: There  was mild regurgitation. - Pulmonary arteries: Systolic pressure was moderately increased.   PA peak pressure: 47 mm Hg (S). - Inferior vena cava: The vessel was dilated. The respirophasic   diameter changes were blunted (< 50%), consistent with elevated   central venous pressure. - Pericardium, extracardiac: There was no pericardial effusion.   Antimicrobials:  Anti-infectives (From admission, onward)   None     Subjective: Skin examined at bedside was doing okay.  Was concerned about her blood sugars being low.  No chest pain, shortness of breath, nausea, vomiting however she did have a short episode of proximal atrial fibrillation this morning she did describe some chest pain.  No lightheadedness or dizziness currently doing better.  Daughter describes her becoming very diaphoretic last night.  Objective: Vitals:   08/21/17 2246 08/22/17 0607 08/22/17 1302 08/22/17 2037  BP: 113/61 (!) 145/62 (!) 128/52 (!) 137/55  Pulse: 76 71 (!) 59 63  Resp:  (!) 22  18  Temp:  98 F (36.7 C) 97.9 F (36.6 C) 98.3 F (36.8 C)  TempSrc:  Oral Oral   SpO2:  96% 92% 94%  Weight:      Height:        Intake/Output Summary (Last 24 hours) at 08/22/2017 2125 Last data filed at 08/22/2017 2000 Gross per 24 hour  Intake 998.85 ml  Output 200 ml  Net 798.85 ml   Filed Weights   08/20/17 0829 08/20/17 1347 08/21/17 0408  Weight: 69.4 kg (153 lb) 72.1 kg (158 lb 15.2 oz) 72.3 kg (159 lb 6.3 oz)   Examination: Physical Exam:  Constitutional: Well-nourished well-developed elderly African-American female who is currently in no acute distress appears calm and comfortable sitting at bedside in chair. Eyes: Lids and conjunctive are normal.  Sclera anicteric. ENMT: External ears and nose appear normal.  Grossly normal hearing. Neck: Supple with no JVD Respiratory: Diminished to auscultation with mild crackles.  No appreciable wheezing, rales, rhonchi.  Normal respiratory effort however patient is  wearing 2 L nasal cannula Cardiovascular: Regular rate and rhythm.  No appreciable murmurs, rubs, gallops.  Trace lower examinee edema Abdomen: Soft, nontender, distended secondary to body habitus.  Bowel sounds positive GU: Deferred Musculoskeletal: No clubbing or cyanosis.  Left arm AV fistula Skin: Warm and dry.  No appreciable rashes or lesions on limited skin evaluation Neurologic: Cranial nerves II through XII grossly intact with no appreciable focal deficits. Psychiatric: Normal mood and affect.  Intact judgment and insight.  Data Reviewed: I have personally reviewed following labs and imaging studies  CBC: Recent Labs  Lab 08/20/17 1509 08/22/17 0521  WBC 7.0 8.0  NEUTROABS  --  4.9  HGB 8.7* 8.2*  HCT 27.6* 26.1*  MCV 88.2 87.9  PLT 168 834   Basic Metabolic Panel: Recent Labs  Lab 08/20/17 0838 08/20/17 1509 08/21/17 0548 08/22/17 0521  NA 140  --  142 137  K 3.9  --  3.9 4.5  CL 104  --  107 105  CO2 22  --  26 24  GLUCOSE 362*  --  67 44*  BUN 55*  --  54* 53*  CREATININE 3.65* 3.43* 3.58* 3.56*  CALCIUM 8.7*  --  8.7* 8.5*  MG  --   --   --  1.5*  PHOS  --   --   --  2.5   GFR: Estimated Creatinine Clearance: 12.6 mL/min (A) (by C-G formula based on SCr of 3.56 mg/dL (H)). Liver Function Tests: Recent Labs  Lab 08/20/17 0838 08/22/17 0521  AST 19 33  ALT 9* 13*  ALKPHOS 93 82  BILITOT 0.7 0.8  PROT 7.0 6.5  ALBUMIN 3.2* 2.8*   No results for input(s): LIPASE, AMYLASE in the last 168 hours. No results for input(s): AMMONIA in the last 168 hours. Coagulation Profile: Recent Labs  Lab 08/21/17 1927  INR 1.00   Cardiac Enzymes: Recent Labs  Lab 08/20/17 0838 08/21/17 1807 08/22/17 0016 08/22/17 0521  TROPONINI 0.04* 1.73* 1.48* 1.56*   BNP (last 3 results) No results for input(s): PROBNP in the last 8760 hours. HbA1C: No results for input(s): HGBA1C in the last 72 hours. CBG: Recent Labs  Lab 08/22/17 0645 08/22/17 0749  08/22/17 1155 08/22/17 1644 08/22/17 2031  GLUCAP 78 129* 217* 173* 193*   Lipid Profile: No results for input(s): CHOL, HDL, LDLCALC, TRIG, CHOLHDL, LDLDIRECT in the last 72 hours. Thyroid Function Tests: No results for input(s): TSH, T4TOTAL, FREET4, T3FREE, THYROIDAB in the last 72 hours. Anemia Panel: Recent Labs    08/22/17 0521  VITAMINB12 206  FOLATE 14.6  FERRITIN 672*  TIBC 175*  IRON 13*  RETICCTPCT 2.8   Sepsis Labs: No results for input(s): PROCALCITON, LATICACIDVEN in the last 168 hours.  No results found for this or any previous visit (from the past 240 hour(s)).   Radiology Studies: Dg Chest 2 View  Result Date: 08/22/2017 CLINICAL DATA:  CHF with shortness of breath EXAM: CHEST - 2 VIEW COMPARISON:  08/22/2017, 08/20/2017, 03/06/2017 FINDINGS: Low lung volumes. Tiny pleural effusions. Mild cardiomegaly with vascular congestion, overall decreased. Decreased pulmonary edema with minimal residual. Aortic atherosclerosis. No pneumothorax. IMPRESSION: 1. Low lung volumes. 2. Cardiomegaly with vascular congestion and minimal edema, overall improved since prior radiograph 3. Small pleural effusions. Electronically Signed   By: Donavan Foil M.D.   On: 08/22/2017 20:00   Dg Chest Port 1 View  Result Date: 08/22/2017 CLINICAL DATA:  Shortness of breath. EXAM: PORTABLE CHEST 1 VIEW COMPARISON:  Radiograph of August 20, 2017. FINDINGS: Stable cardiomegaly with bilateral pulmonary edema. Atherosclerosis of thoracic aorta is noted. No pneumothorax or pleural effusion is noted. Bony thorax is unremarkable. IMPRESSION: Stable cardiomegaly with bilateral pulmonary edema. Aortic Atherosclerosis (ICD10-I70.0). Electronically Signed   By: Marijo Conception, M.D.   On: 08/22/2017 07:22   Scheduled Meds: . amLODipine  10 mg Oral Daily  . aspirin EC  81 mg Oral Daily  . citalopram  20 mg Oral Daily  . cloNIDine  0.2 mg Oral BID  . clopidogrel  75 mg Oral Daily  . feeding supplement  (NEPRO CARB STEADY)  237 mL Oral BID BM  . furosemide  80 mg Intravenous TID  . hydrALAZINE  25 mg Oral Q8H  . insulin aspart  0-9 Units Subcutaneous TID WC  . insulin aspart  3 Units Subcutaneous TID WC  . insulin detemir  10 Units Subcutaneous BID  . isosorbide mononitrate  30 mg Oral Daily  . pantoprazole  40 mg Oral Daily  . potassium chloride SA  40 mEq Oral Daily  . pravastatin  80 mg Oral Daily  . sodium chloride flush  3 mL Intravenous Q12H   Continuous Infusions: . sodium chloride    . heparin 1,000 Units/hr (08/22/17 2029)    LOS: 2 days   Kerney Elbe, DO [facility]  Pager [phone]  If 7PM-7AM, please contact night-coverage [email or web address] Password [facility] 08/22/2017, 9:25 PM

## 2017-08-22 NOTE — Progress Notes (Signed)
ANTICOAGULATION CONSULT NOTE - Follow-Up Consult  Pharmacy Consult for Heparin Indication: chest pain/ACS  No Known Allergies  Patient Measurements: Height: 5\' 6"  (167.6 cm) Weight: 159 lb 6.3 oz (72.3 kg) IBW/kg (Calculated) : 59.3 Heparin Dosing Weight: actual weight  Vital Signs: Temp: 97.9 F (36.6 C) (04/29 1302) Temp Source: Oral (04/29 1302) BP: 128/52 (04/29 1302) Pulse Rate: 59 (04/29 1302)  Labs: Recent Labs    08/20/17 1509 08/21/17 0548 08/21/17 1807 08/21/17 1927 08/22/17 0016 08/22/17 0521 08/22/17 1938  HGB 8.7*  --   --   --   --  8.2*  --   HCT 27.6*  --   --   --   --  26.1*  --   PLT 168  --   --   --   --  171  --   APTT  --   --   --  37*  --   --   --   LABPROT  --   --   --  13.1  --   --   --   INR  --   --   --  1.00  --   --   --   HEPARINUNFRC  --   --   --   --   --  0.37 0.23*  CREATININE 3.43* 3.58*  --   --   --  3.56*  --   TROPONINI  --   --  1.73*  --  1.48* 1.56*  --     Estimated Creatinine Clearance: 12.6 mL/min (A) (by C-G formula based on SCr of 3.56 mg/dL (H)).   Medical History: Past Medical History:  Diagnosis Date  . Arthritis   . Breast cancer ([place])   . CAD (coronary artery disease)   . Chest pain   . CHF (congestive heart failure) ([place])   . Chronic kidney disease    stage 4  . Dizziness   . DM (diabetes mellitus) ([place])   . GERD (gastroesophageal reflux disease)   . HLD (hyperlipidemia)   . HTN (hypertension)   . Obesity   . Radiation 09/11/15-10/13/15   42.72 Gy to right breast, 12 Gy boost to right breast  . Shortness of breath dyspnea   . Sleep apnea    no longer uses cpap   . Stroke [facility])    balance issues - walks with walker and uses wheelchair   . UTI (urinary tract infection)     Medications:  Scheduled:  . amLODipine  10 mg Oral Daily  . aspirin EC  81 mg Oral Daily  . citalopram  20 mg Oral Daily  . cloNIDine  0.2 mg Oral BID  . clopidogrel  75 mg Oral Daily  . feeding supplement (NEPRO  CARB STEADY)  237 mL Oral BID BM  . furosemide  80 mg Intravenous TID  . hydrALAZINE  25 mg Oral Q8H  . insulin aspart  0-9 Units Subcutaneous TID WC  . insulin aspart  3 Units Subcutaneous TID WC  . insulin detemir  10 Units Subcutaneous BID  . isosorbide mononitrate  30 mg Oral Daily  . pantoprazole  40 mg Oral Daily  . potassium chloride SA  40 mEq Oral Daily  . pravastatin  80 mg Oral Daily  . sodium chloride flush  3 mL Intravenous Q12H   Infusions:  . sodium chloride    . heparin 900 Units/hr (08/22/17 0935)   PRN: sodium chloride, acetaminophen, famotidine, ondansetron (ZOFRAN) IV, simethicone, sodium  chloride flush  Assessment: 84 yo female with hx grade 3 heart failure presents with worsening shortness of breath.  Troponins now elevated and Pharmacy consulted to dose IV heparin for r/o ACS.  Cardiology to consult in AM.   Baseline aPTT 13.1, PT/INR 1.00   CBC: Hgb low 8.2, stable , Plts 171  SCr 3.56, CrCl ~12 ml/min  Pt had an extended period of interrupted heparin infusion to a loss of IV access. The medication was stopped from about 0815 to 0953 per MAR. Heparin was then restarted at the previous rate of 900 units/hr  HL 0.23 subtherapeutic  No bleeding or other issues per RN  Goal of Therapy:  Heparin level 0.3-0.7 units/ml Monitor platelets by anticoagulation protocol: Yes   Plan:   Bolus heparin 1000 units x 1 and increase heparin drip to 1000 units/hr IV   Check heparin level in 8hrs  Daily heparin level and CBC  Monitor for signs and symptoms of bleeding  Dolly Rias RPh 08/22/2017, 8:08 PM Pager [phone]

## 2017-08-22 NOTE — Care Management Note (Signed)
Case Management Note  Patient Details  Name: Glenda [name] MRN: [id number] Date of Birth: 08-11-1933  Subjective/Objective: 84 y/o f admitted w/ACS, uncontrolled htn. Hx: CHF, CKD,Breast Ca. From home. PT cons-await recc.                Action/Plan:d/c plan home.   Expected Discharge Date:                  Expected Discharge Plan:  Home/Self Care  In-House Referral:     Discharge planning Services  CM Consult  Post Acute Care Choice:    Choice offered to:     DME Arranged:    DME Agency:     HH Arranged:    HH Agency:     Status of Service:  In process, will continue to follow  If discussed at Long Length of Stay Meetings, dates discussed:    Additional Comments:  Dessa Phi, RN 08/22/2017, 12:37 PM

## 2017-08-22 NOTE — Plan of Care (Signed)
  Problem: Education: Goal: Knowledge of General Education information will improve Outcome: Progressing   Problem: Clinical Measurements: Goal: Ability to maintain clinical measurements within normal limits will improve Outcome: Progressing Goal: Will remain free from infection Outcome: Progressing Goal: Diagnostic test results will improve Outcome: Progressing Goal: Respiratory complications will improve Outcome: Progressing Goal: Cardiovascular complication will be avoided Outcome: Progressing   Problem: Elimination: Goal: Will not experience complications related to bowel motility Outcome: Progressing Goal: Will not experience complications related to urinary retention Outcome: Progressing   Problem: Pain Managment: Goal: General experience of comfort will improve Outcome: Progressing   Problem: Safety: Goal: Ability to remain free from injury will improve Outcome: Progressing

## 2017-08-22 NOTE — Progress Notes (Signed)
ANTICOAGULATION CONSULT NOTE - Follow-Up Consult  Pharmacy Consult for Heparin Indication: chest pain/ACS  No Known Allergies  Patient Measurements: Height: 5\' 6"  (167.6 cm) Weight: 159 lb 6.3 oz (72.3 kg) IBW/kg (Calculated) : 59.3 Heparin Dosing Weight: actual weight  Vital Signs: Temp: 98 F (36.7 C) (04/29 0607) Temp Source: Oral (04/29 0607) BP: 145/62 (04/29 0607) Pulse Rate: 71 (04/29 0607)  Labs: Recent Labs    08/20/17 1509 08/21/17 0548 08/21/17 1807 08/21/17 1927 08/22/17 0016 08/22/17 0521  HGB 8.7*  --   --   --   --  8.2*  HCT 27.6*  --   --   --   --  26.1*  PLT 168  --   --   --   --  171  APTT  --   --   --  37*  --   --   LABPROT  --   --   --  13.1  --   --   INR  --   --   --  1.00  --   --   HEPARINUNFRC  --   --   --   --   --  0.37  CREATININE 3.43* 3.58*  --   --   --  3.56*  TROPONINI  --   --  1.73*  --  1.48* 1.56*    Estimated Creatinine Clearance: 12.6 mL/min (A) (by C-G formula based on SCr of 3.56 mg/dL (H)).   Medical History: Past Medical History:  Diagnosis Date  . Arthritis   . Breast cancer ([place])   . CAD (coronary artery disease)   . Chest pain   . CHF (congestive heart failure) ([place])   . Chronic kidney disease    stage 4  . Dizziness   . DM (diabetes mellitus) ([place])   . GERD (gastroesophageal reflux disease)   . HLD (hyperlipidemia)   . HTN (hypertension)   . Obesity   . Radiation 09/11/15-10/13/15   42.72 Gy to right breast, 12 Gy boost to right breast  . Shortness of breath dyspnea   . Sleep apnea    no longer uses cpap   . Stroke [facility])    balance issues - walks with walker and uses wheelchair   . UTI (urinary tract infection)     Medications:  Scheduled:  . amLODipine  10 mg Oral Daily  . aspirin EC  81 mg Oral Daily  . citalopram  20 mg Oral Daily  . cloNIDine  0.2 mg Oral BID  . clopidogrel  75 mg Oral Daily  . feeding supplement (NEPRO CARB STEADY)  237 mL Oral BID BM  . furosemide  60 mg Intravenous  BID  . hydrALAZINE  25 mg Oral Q8H  . insulin aspart  0-9 Units Subcutaneous TID WC  . insulin aspart  3 Units Subcutaneous TID WC  . insulin detemir  10 Units Subcutaneous BID  . isosorbide mononitrate  30 mg Oral Daily  . pantoprazole  40 mg Oral Daily  . potassium chloride SA  40 mEq Oral Daily  . pravastatin  80 mg Oral Daily  . sodium chloride flush  3 mL Intravenous Q12H   Infusions:  . sodium chloride    . heparin 900 Units/hr (08/22/17 0953)   PRN: sodium chloride, acetaminophen, famotidine, ondansetron (ZOFRAN) IV, simethicone, sodium chloride flush  Assessment: 84 yo female with hx grade 3 heart failure presents with worsening shortness of breath.  Troponins now elevated and Pharmacy consulted to  dose IV heparin for r/o ACS.  Cardiology to consult in AM.   Baseline aPTT 13.1, PT/INR 1.00   CBC: Hgb low 8.2, stable , Plts 171  SCr 3.56, CrCl ~12 ml/min  HL: This AM was therapeutic at 0.37  Pt had an extended period of interrupted heparin infusion to a loss of IV access. The medication was stopped from about 0815 to 0953 per MAR. Heparin was then restarted at the previous rate of 900 units/hr  Goal of Therapy:  Heparin level 0.3-0.7 units/ml Monitor platelets by anticoagulation protocol: Yes   Plan:   Continue heparin 900 units/hr IV infusion  Check heparin level in 8hrs  Daily heparin level and CBC  Monitor for signs and symptoms of bleeding    Royetta Asal, PharmD, BCPS Pager [phone] 08/22/2017 11:23 AM

## 2017-08-22 NOTE — Progress Notes (Addendum)
Inpatient Diabetes Program Recommendations  AACE/ADA: New Consensus Statement on Inpatient Glycemic Control (2015)  Target Ranges:  Prepandial:   less than 140 mg/dL      Peak postprandial:   less than 180 mg/dL (1-2 hours)      Critically ill patients:  140 - 180 mg/dL   Lab Results  Component Value Date   GLUCAP 129 (H) 08/22/2017   HGBA1C 9.6 (H) 03/01/2017    Review of Glycemic Control Results for Glenda [name], Glenda [name] (MRN [id number]) as of 08/22/2017 09:38  Ref. Range 08/21/2017 22:32 08/22/2017 00:15 08/22/2017 06:16 08/22/2017 06:45 08/22/2017 07:49  Glucose-Capillary Latest Ref Range: 65 - 99 mg/dL 75 88 45 (L) 78 129 (H)   Diabetes history: DM2 Outpatient Diabetes medications: Humalog 50/50 22 units am + 18 units pm Current orders for Inpatient glycemic control: 70/30 insulin 22 units am + 18 units pm + Novolog correction moderate tid   Inpatient Diabetes Program Recommendations:   Noted hypoglycemia. While in the hospital: -D/C 70/30 and change to Levemir 10 units bid -Novolog 3 units tid meal coverage if eats 50% -Decrease Novolog correction to sensitive  Text page to Dr. Alfredia Ferguson with above recommendations.  Thank you, Nani Gasser. Mahkai Fangman, RN, MSN, CDE  Diabetes Coordinator Inpatient Glycemic Control Team Team Pager [phone] (8am-5pm) 08/22/2017 9:46 AM

## 2017-08-22 NOTE — Progress Notes (Signed)
Patient converted to A Fib rate uncontrolled ranging from 80's-140's, EKG obtained and confirmed. On call provider, Bodenheimer, paged and order obtained to give 5mg  IV metoprolol. Provider advised RN to page back if HR sustained above 120. Patient to sinus brady around 0010 and then normal sinus at 0015. EKG obtained to confirm. Per tele monitor, patient had been switching back and forth from NSR to A Fib throughout the night. Will continue to monitor.

## 2017-08-22 NOTE — Progress Notes (Signed)
Hypoglycemic Event  CBG: 37  Treatment: 15 GM carbohydrate snack  Symptoms: Pale, Sweaty and Hungry  Follow-up CBG: Time:2232 CBG Result:75  Possible Reasons for Event: Inadequate meal intake and Medication regimen: novolog 70/30  Comments/MD notified: [place]

## 2017-08-22 NOTE — Progress Notes (Signed)
Hypoglycemic Event  CBG: 45  Treatment: 15 GM carbohydrate snack  Symptoms: Pale and Sweaty  Follow-up CBG: [id number] CBG Result:78  Possible Reasons for Event: Inadequate meal intake  Comments/MD notified:n/a    Lauro Regulus

## 2017-08-22 NOTE — Consult Note (Addendum)
Cardiology Consultation:   Patient ID: Glenda [name]; [id number]; 1933-07-29   Admit date: 08/20/2017 Date of Consult: 08/22/2017  Primary Care Provider: Angelica Pou, MD Primary Cardiologist: Ena Dawley, MD   Patient Profile:   Glenda [name] is a 84 y.o. female with a hx of CAD (moderate by cath 7829), Diastolic heart failure, CKD stage V, DM, HLD, HTN, GERD, breast cancer, OSA and stroke 2016 who is being seen today for the evaluation of elevated troponin and diastolic heart failure at the request of Dr. Alfredia Ferguson.  History of Present Illness:   [name] presented to [facility] on 08/20/17 for complaints of progressively worsening shortness of breath. Her chest Xray showed stable cardiomegaly and bilateral pulmonary edema. BNP is elevated at 755. Troponins was initially 0.04 but elevated to 1.73 and has remained fairly flat. She's had no chest pain. She was hypoxic with O2 sats in the 70's on presentation. She was admitted for treatment of acute on chronic diastolic heart failure. She is being diuresed and is improving slowly.   [name] lives with her son and her daughter is here with her today. Both patient and daughter are providing information. The patient has had her esophagus stretched on Wednesday. On Thursday she noted an increase in swelling of her hands and feet. She did not perceive shortness of breath but her family noticed that she seemed short of breath while talking. She has had no orthopnea and is currently lying almost flat without difficulty. [facility] and went there to be assessed. She was told to elevate her legs. On Thursday night she began vomiting. On Friday her family felt that her swelling and breathing were worse and brought her to the ED. The patient admits to some chest tightness with nausea just prior to presentation that was intermittent, non radiating. This resolved and has not reoccurred.   The patient is very  sedentary. [facility] 3 times per week and some mild exercise there and plays games but at home she is very sedentary. She has had no recent changes in her activity level. She has had 3 strokes in the past per her daughter. She has OSA but stopped using her CPAP about 4-5 months ago as it was not helping her. She denies any previous known afib. All of her meds are provided in pill packs and although she does not know what she is taking she is compliant. She reports only voiding about 2-3 times per day as her usual.   She had an episode of afib on the monitor last night during which she was not aware of any palpitations or lightheadedness but the daughter and staff felt that she was more short of breath. At home she denies any history of palpitations or lightheadedness.    Past Medical History:  Diagnosis Date  . Arthritis   . Breast cancer ([place])   . CAD (coronary artery disease)   . Chest pain   . CHF (congestive heart failure) ([place])   . Chronic kidney disease    stage 4  . Dizziness   . DM (diabetes mellitus) ([place])   . GERD (gastroesophageal reflux disease)   . HLD (hyperlipidemia)   . HTN (hypertension)   . Obesity   . Radiation 09/11/15-10/13/15   42.72 Gy to right breast, 12 Gy boost to right breast  . Shortness of breath dyspnea   . Sleep apnea    no longer uses  cpap   . Stroke [facility])    balance issues - walks with walker and uses wheelchair   . UTI (urinary tract infection)     Past Surgical History:  Procedure Laterality Date  . AV FISTULA PLACEMENT Left 07/14/2017   Procedure: Left arm Brachicephalic Fistula Creation;  Surgeon: Serafina Mitchell, MD;  Location: [place];  Service: Vascular;  Laterality: Left;  . BALLOON DILATION N/A 08/18/2017   Procedure: BALLOON DILATION;  Surgeon: Clarene Essex, MD;  Location: [place];  Service: Endoscopy;  Laterality: N/A;  . CHOLECYSTECTOMY    . colon polyps; hx    . CORONARY ANGIOPLASTY  02/14/09  .  ESOPHAGOGASTRODUODENOSCOPY (EGD) WITH PROPOFOL Left 09/08/2015   Procedure: ESOPHAGOGASTRODUODENOSCOPY (EGD) WITH PROPOFOL;  Surgeon: Clarene Essex, MD;  Location: [facility] ENDOSCOPY;  Service: Endoscopy;  Laterality: Left;  . ESOPHAGOGASTRODUODENOSCOPY (EGD) WITH PROPOFOL N/A 08/18/2017   Procedure: ESOPHAGOGASTRODUODENOSCOPY (EGD) WITH PROPOFOL;  Surgeon: Clarene Essex, MD;  Location: [place];  Service: Endoscopy;  Laterality: N/A;  with flouroscopy  . RADIOACTIVE SEED GUIDED PARTIAL MASTECTOMY WITH AXILLARY SENTINEL LYMPH NODE BIOPSY Right 07/17/2015   Procedure: RADIOACTIVE SEED GUIDED PARTIAL MASTECTOMY WITH AXILLARY SENTINEL LYMPH NODE BIOPSY;  Surgeon: Erroll Luna, MD;  Location: [place];  Service: General;  Laterality: Right;     Home Medications:  Prior to Admission medications   Medication Sig Start Date End Date Taking? Authorizing Provider  acetaminophen (TYLENOL) 500 MG tablet Take 1,000 mg by mouth daily as needed for moderate pain or headache.   Yes [provider]  amLODipine (NORVASC) 10 MG tablet Take 10 mg by mouth daily.    Yes [provider]  aspirin EC 81 MG tablet Take 81 mg by mouth daily.   Yes [provider]  carvedilol (COREG) 12.5 MG tablet Take 1 tablet (12.5 mg total) by mouth 2 (two) times daily with a meal. 01/06/17  Yes Regalado, Belkys A, MD  cetirizine (ZYRTEC) 10 MG tablet Take 10 mg daily by mouth.   Yes [provider]  citalopram (CELEXA) 20 MG tablet Take 20 mg by mouth daily.   Yes [provider]  cloNIDine (CATAPRES) 0.2 MG tablet Take 0.2 mg by mouth 2 (two) times daily.    Yes [provider]  clopidogrel (PLAVIX) 75 MG tablet Take 75 mg by mouth daily.    Yes [provider]  famotidine (PEPCID AC) 10 MG chewable tablet Chew 10 mg by mouth daily as needed for heartburn.   Yes [provider]  HUMALOG MIX 50/50 KWIKPEN (50-50) 100 UNIT/ML Kwikpen Inject 18-22 Units into the skin 2 (two)  times daily. Inject 22 units in the morning and 18 units at bedtime 01/06/17  Yes [provider]  hydrALAZINE (APRESOLINE) 25 MG tablet Take 1 tablet (25 mg total) every 8 (eight) hours by mouth. 03/06/17  Yes Thurnell Lose, MD  isosorbide mononitrate (IMDUR) 30 MG 24 hr tablet Take 1 tablet (30 mg total) daily by mouth. 03/06/17 08/20/17 Yes Thurnell Lose, MD  metolazone (ZAROXOLYN) 2.5 MG tablet Take 2.5 mg by mouth daily.   Yes [provider]  Nutritional Supplements (FEEDING SUPPLEMENT, NEPRO CARB STEADY,) LIQD Take 237 mLs by mouth 2 (two) times daily between meals. 01/06/17  Yes Regalado, Belkys A, MD  pantoprazole (PROTONIX) 40 MG tablet Take 40 mg by mouth daily.   Yes [provider]  potassium chloride SA (K-DUR,KLOR-CON) 20 MEQ tablet Take 2 tablets (40 mEq total) daily by mouth. 03/07/17  Yes Thurnell Lose, MD  pravastatin (PRAVACHOL) 80 MG tablet Take 80 mg by mouth daily.   Yes [provider]  simethicone (GAS-X) 80 MG chewable tablet Chew 80 mg by mouth every 6 (six) hours as needed for flatulence.   Yes [provider]  torsemide (DEMADEX) 100 MG tablet Take 1 tablet (100 mg total) 2 (two) times daily by mouth. 03/06/17  Yes Thurnell Lose, MD  oxyCODONE-acetaminophen (PERCOCET) 5-325 MG tablet Take 1 tablet by mouth every 8 (eight) hours as needed for severe pain. Patient not taking: Reported on 08/17/2017 07/14/17   Gabriel Earing, PA-C    Inpatient Medications: Scheduled Meds: . amLODipine  10 mg Oral Daily  . aspirin EC  81 mg Oral Daily  . citalopram  20 mg Oral Daily  . cloNIDine  0.2 mg Oral BID  . clopidogrel  75 mg Oral Daily  . feeding supplement (NEPRO CARB STEADY)  237 mL Oral BID BM  . furosemide  60 mg Intravenous BID  . hydrALAZINE  25 mg Oral Q8H  . insulin aspart  0-15 Units Subcutaneous TID WC  . insulin aspart protamine- aspart  18 Units Subcutaneous Q supper  . insulin aspart protamine- aspart   22 Units Subcutaneous Q breakfast  . isosorbide mononitrate  30 mg Oral Daily  . pantoprazole  40 mg Oral Daily  . potassium chloride SA  40 mEq Oral Daily  . pravastatin  80 mg Oral Daily  . sodium chloride flush  3 mL Intravenous Q12H   Continuous Infusions: . sodium chloride    . heparin 900 Units/hr (08/21/17 2201)   PRN Meds: sodium chloride, acetaminophen, famotidine, ondansetron (ZOFRAN) IV, simethicone, sodium chloride flush  Allergies:   No Known Allergies  Social History:   Social History   Socioeconomic History  . Marital status: Widowed    Spouse name: Not on file  . Number of children: 5  . Years of education: 56  . Highest education level: Not on file  Occupational History  . Occupation: Retired  Scientific laboratory technician  . Financial resource strain: Not on file  . Food insecurity:    Worry: Not on file    Inability: Not on file  . Transportation needs:    Medical: Not on file    Non-medical: Not on file  Tobacco Use  . Smoking status: Never Smoker  . Smokeless tobacco: Never Used  Substance and Sexual Activity  . Alcohol use: No    Alcohol/week: 0.0 oz  . Drug use: No  . Sexual activity: Not Currently  Lifestyle  . Physical activity:    Days per week: Not on file    Minutes per session: Not on file  . Stress: Not on file  Relationships  . Social connections:    Talks on phone: Not on file    Gets together: Not on file    Attends religious service: Not on file    Active member of club or organization: Not on file    Attends meetings of clubs or organizations: Not on file    Relationship status: Not on file  . Intimate partner violence:    Fear of current or ex partner: Not on file    Emotionally abused: Not on file    Physically abused: Not on file    Forced sexual activity: Not on file  Other Topics Concern  . Not on file  Social History Narrative   Retired.    Lives with daughter  Caffeine use: Drinks soda and coffee and tea daily    Family  History:    Family History  Problem Relation Age of Onset  . Diabetes Mother   . Colon cancer Father        also had lung  . Diabetes Unknown   . Heart attack Unknown   . Diabetes Sister        [name]  . Breast cancer Sister      ROS:  Please see the history of present illness.   All other ROS reviewed and negative.     Physical Exam/Data:   Vitals:   08/21/17 1913 08/21/17 2153 08/21/17 2246 08/22/17 0607  BP: (!) 145/48 (!) 176/63 113/61 (!) 145/62  Pulse: 75 92 76 71  Resp: (!) 22 (!) 30  (!) 22  Temp: 99.6 F (37.6 C) 99.4 F (37.4 C)  98 F (36.7 C)  TempSrc: Oral Oral  Oral  SpO2: 100% 100%  96%  Weight:      Height:        Intake/Output Summary (Last 24 hours) at 08/22/2017 0721 Last data filed at 08/22/2017 0200 Gross per 24 hour  Intake 1358.85 ml  Output 100 ml  Net 1258.85 ml   Filed Weights   08/20/17 0829 08/20/17 1347 08/21/17 0408  Weight: 153 lb (69.4 kg) 158 lb 15.2 oz (72.1 kg) 159 lb 6.3 oz (72.3 kg)   Body mass index is 25.73 kg/m.  General:  Chronically ill appearing elderly female, in no acute distress HEENT: normal Lymph: no adenopathy Neck: no JVD Endocrine:  No thryomegaly Vascular: No carotid bruits; FA pulses 2+ bilaterally without bruits  Cardiac:  normal S1, S2; RRR; 1/6 apical murmur Lungs:  clear to auscultation bilaterally, no wheezing, rhonchi or rales  Abd: soft, nontender, no hepatomegaly  Ext: no edema Musculoskeletal:  No deformities, BUE and BLE strength normal and equal Skin: warm and dry  Neuro:  CNs 2-12 intact, no focal abnormalities noted Psych:  Normal affect   EKG:  The EKG was personally reviewed and demonstrates:  NSR with non-specific T changes. AFib at 115 bpm last night.  Telemetry:  Telemetry was personally reviewed and demonstrates:  NSR in the 60's. Had 2 hours of afib last night with rates 70's-110's.   Relevant CV Studies:  Echocardiogram 08/21/17 Study Conclusions  - Left ventricle: The cavity  size was normal. There was moderate   concentric hypertrophy. Systolic function was normal. The   estimated ejection fraction was in the range of 60% to 65%. Wall   motion was normal; there were no regional wall motion   abnormalities. Features are consistent with a pseudonormal left   ventricular filling pattern, with concomitant abnormal relaxation   and increased filling pressure (grade 2 diastolic dysfunction).   Doppler parameters are consistent with elevated ventricular   end-diastolic filling pressure. - Mitral valve: Calcified annulus. Mildly thickened leaflets .   There was moderate regurgitation. - Left atrium: The atrium was moderately dilated. - Right ventricle: The cavity size was mildly dilated. Wall   thickness was normal. Systolic function was normal. - Tricuspid valve: There was mild regurgitation. - Pulmonary arteries: Systolic pressure was moderately increased.   PA peak pressure: 47 mm Hg (S). - Inferior vena cava: The vessel was dilated. The respirophasic   diameter changes were blunted (< 50%), consistent with elevated   central venous pressure. - Pericardium, extracardiac: There was no pericardial effusion.   Echo 12/2016:  Severe LVH, EF 65-70%, grade  3 DD, PA peak pressure 51 mmHg, mild-mod MR.   Myocardial perfusion imaging 01/18/2014: Normal test, no ischemia, normal LVEF  Left cardiac cath 2012 ANGIOGRAPHIC DATA:  1. Ventriculography was performed in the RAO projection. Overall systolic  function was well preserved and no segmental abnormalities or contraction  were identified. There was near cavity obliteration. No significant  mitral regurgitation was noted.  2. There was no obvious calcification of the coronary vessels.  3. The left main coronary artery was fairly large and free of critical  disease.  4. The LAD coursed to the apex, wrapped the apex, and supplied a significant  portion of the distal inferior wall. There was a tiny first diagonal    that was free of critical disease. The proximal and mid LAD appeared to  be without significant focal narrowing. The LAD after the major diagonal  which was the second diagonal demonstrates a segmental plaque of about 50-  60%. This was previously graded at 30, but appeared to be only mildly  progressed from the previous study. There is also a diagonal stenosis  that involves likely the bifurcation and is about 50-60% as well. The  diagonal is moderate in size.  5. The circumflex provides a tiny first marginal branch that has about a 50%  area of narrowing but it is very small. There is a second large marginal  branch which bifurcates twice in its distal course. It is a large  caliber vessel and without critical disease. The AV circumflex has a  segmental area of about 60-70% right after the takeoff of a large major  diagonal branch and that is graded a 60-70%. The distal termination of  this is a smaller posterolateral branch which thus supplies a fairly  small area of myocardium.  6. The right coronary artery is a small caliber vessel being 2 mm or less in  size. There is about a 30-40% area of plaquing in the first bend between  the proximal and mid vessel. There is probably 40% mid narrowing and in  the distal vessel some mild luminal irregularity. The terminal branches  which consist of a posterior descending and several posterolateral  branches are relatively small in caliber.  CONCLUSIONS:  1. Hypodynamic left ventricular function.  2. Scattered disease of the left anterior descending and diagonal as noted  on previous study.  3. Moderate obstruction of the AV circumflex after the large marginal  takeoff.  4. Scattered irregularities of the right coronary artery as described above.   DISPOSITION: The initial plan would be to treat the patient medically. It  is unclear which, if any, of these lesions might be causing significant  angina. We plan to check a D-dimer. We also plan  to give the patient  medical therapy. She will be followed by Jeanann Lewandowsky, M.D. and either  Junious Silk, M.D. [facility] or Loretha Brasil. [name], M.D.     Laboratory Data:  Chemistry Recent Labs  Lab 08/20/17 2229 08/20/17 1509 08/21/17 0548 08/22/17 0521  NA 140  --  142 137  K 3.9  --  3.9 4.5  CL 104  --  107 105  CO2 22  --  26 24  GLUCOSE 362*  --  67 44*  BUN 55*  --  54* 53*  CREATININE 3.65* 3.43* 3.58* 3.56*  CALCIUM 8.7*  --  8.7* 8.5*  GFRNONAA 11* 12* 11* 11*  GFRAA 12* 13* 13* 13*  ANIONGAP 14  --  9 8  Recent Labs  Lab 08/20/17 0838 08/22/17 0521  PROT 7.0 6.5  ALBUMIN 3.2* 2.8*  AST 19 33  ALT 9* 13*  ALKPHOS 93 82  BILITOT 0.7 0.8   Hematology Recent Labs  Lab 08/20/17 1509 08/22/17 0521  WBC 7.0 8.0  RBC 3.13* 2.97*  2.97*  HGB 8.7* 8.2*  HCT 27.6* 26.1*  MCV 88.2 87.9  MCH 27.8 27.6  MCHC 31.5 31.4  RDW 13.2 13.5  PLT 168 171   Cardiac Enzymes Recent Labs  Lab 08/20/17 0838 08/21/17 1807 08/22/17 0016 08/22/17 0521  TROPONINI 0.04* 1.73* 1.48* 1.56*   No results for input(s): TROPIPOC in the last 168 hours.  BNP Recent Labs  Lab 08/20/17 0838  BNP 755.4*    DDimer No results for input(s): DDIMER in the last 168 hours.  Radiology/Studies:  Dg Chest Port 1 View  Result Date: 08/20/2017 CLINICAL DATA:  Shortness of breath since yesterday.  Arm pain. EXAM: PORTABLE CHEST 1 VIEW COMPARISON:  March 06, 2017 FINDINGS: Stable mild cardiomegaly. No pneumothorax. Moderate pulmonary edema. No other changes. IMPRESSION: Cardiomegaly and moderate pulmonary edema. Electronically Signed   By: Dorise Bullion III M.D   On: 08/20/2017 09:08    Assessment and Plan:   Acute on chronic diastolic CHF -Admitted with progressively worsening dyspnea.  -Home therapy includes carvedilol 12.5 mg bid, hydralazine 25 mg TID, Imdur 30 mg daily, torsemide 100 mg BID, metolazone 2.5 mg daily. Marland Kitchen No ACE-I/ARB due to CKD.  -chest Xray showed stable  cardiomegaly and bilateral pulmonary edema. -BNP is elevated at 755. -Troponins was initially 0.04 but elevated to 1.73 and has remained fairly flat.  -Echo done yesterday showed Moderated LVH with normal LV systolic function, EF 03-47%, grade 2 DD, mod MR, Moderately increased pulmonary artery pressure, 47 mmHg -Diuresing with IV lasix 60 mg BID. Wt is not coming down, actually is up per documentation. Poor UOP per documentation. Her edema and breathing have improved and she does not appear to be volume overloaded on exam.  -her symptoms may be more related to afib.  -BB is on hold. Hydralazine and Imdur continued.  Elevated troponins -Troponin 0.04 on admission, elevated to 1.73, 1.48, 1.56.  -Non-specific ST/T changes on EKG -Had brief chest tightness and nausea prior to presentation, none since.  -Has been started on heparin IV. Continues on DAPT.  -Could be related to demand ischemia in setting of CHF vs afib vs NSTEMI. Also may be seeing more of an elevation due to worsened renal function and poor clearing of troponin.  -In setting of no chest pain and CKD, would continue to heart failure and afib and defer invasive procedure for now. Will discuss further ischemic evaluation with attending, Dr. Marlou Porch  Paroxysmal atrial fibrillation -No know history of afib but has had 3 strokes.  -EKG last night showed atrial fibrillation at 115 bpm. Tele showed 2 hours of afib with rates 110's gradually slowing to 70's and then converted to SB in the 50's. Apparently the patient had some shortness of breath with this. NSR this am in the 60's.  -Treat for rate control. Likely restart her carvedilol.  -CHA2DS2/VAS Stroke Risk Score is 8 (CHF,  HTN, Age(2), DM, stroke (2), female) thus high risk for stroke related to afib. She should be anticoagulated with either warfarin or reduced dose Eliquis due to CKD. Currently is anticoagulated with heparin drip. -Will need to address changing her DAPT in setting of  need for anticoagulation.    CKD stage  V -Followed by nephrology as outpatient. Left arm dialysis graft created on 07/14/17. -Scr on admission 3.65. Baseline ~2.5-2.9. Possibly worsening renal function related to dCHF. Remaining stable, 3.56 today.  -Advise nephrology to follow.   Hypertension -Home meds include amlodipine 10 mg daily, carvedilol 12.5 mg bid, clonididne 0.2 mg bid, hydralazine 25 mg tid, Imdur 30 mg daily. BB currently on hold due to decompensated HF. -SBP elevated 140's-170.   Mitral regurgitation -Moderate by echo done yesterday.  Diabetes type 2 on insulin -On SSI while in hospital -Poorly controlled with Hgb A1c 9.6 in 02/2017. Has been up to 13 in 2016.   Hx of stroke -On dual antiplatelet therapy, aspirin 81 mg and Pravachol 80 mg daily as well as statin.   Anemia of chronic disease -Hgb 8.2, looks to be about her norm. Being evaluated by IM.   OSA -No longer using CPAP  Hyperlipidemia -Followed by PCP. LDL 70 in 08/2015 on pravastatin 80 mg daily.   For questions or updates, please contact [place] Please consult [email or web address] for contact info under Cardiology/STEMI.   Signed, Daune Perch, NP  08/22/2017 7:21 AM  Personally seen and examined. Agree with above.  84 year old female with chronic kidney disease stage V awaiting hemodialysis with creatinine 3.5, AV fistula just produced in March 2019 with history of stroke on dual antiplatelet therapy, anemia of chronic disease hemoglobin 8.2, diabetes with hypertension, here with episode of increasing edema, shortness of breath with troponin initially 0.04 elevating to 1.48 then 1.56 this morning with perhaps short burst of chest discomfort that accompanied 2 hours of atrial fibrillation with rapid ventricular response.  Currently feeling better after IV diuresis with Lasix.  Lab work, EKG personally reviewed with nonspecific ST-T wave change agree with above.  Echocardiogram on this admission  shows normal ejection fraction with moderate pulmonary hypertension, pulmonary pressures 49 mmHg, moderate mitral regurgitation.  BNP mildly elevated  GEN: Well nourished, well developed, in no acute distress, elderly HEENT: normal  Neck: no JVD, carotid bruits, or masses Cardiac: RRR; no murmurs, rubs, or gallops, mild lower extremity edema  Respiratory:  clear to auscultation bilaterally, normal work of breathing GI: soft, nontender, nondistended, + BS MS: no deformity or atrophy  Skin: warm and dry, no rash Neuro:  Alert and Oriented x 3, Strength and sensation are intact Psych: euthymic mood, full affect  Assessment and plan:  Demand ischemia -Likely demand ischemia in the setting of severe left ventricular hypertrophy, end-stage renal disease brief chest pain with known moderate coronary artery disease from prior cardiac catheterization reviewed as above.  Currently on IV heparin, Plavix, aspirin.  At this point given that she is improving symptomatically with diuresis, I would continue IV heparin for a total of 48 hours and medically manage her MI without invasive management.  Cardiac catheterization could be entertained at a later date if she continued to have anginal symptoms while she was on hemodialysis.  Performing invasive procedure, cardiac catheterization would have very high risk of evolution to need for hemodialysis on a more emergent basis.   -I discussed with family, understand plan.  Chronic kidney disease stage V - Described in the importance of good fluid management at home, salt restriction.  Daily weights. -I increased her Lasix to 80 mg IV 3 times a day  Paroxysmal atrial fibrillation - Brief episode of atrial fibrillation.  For now, currently on IV heparin, aspirin, Plavix.  Currently in sinus rhythm.  If atrial fibrillation returns, we will start warfarin since  she is going to be on hemodialysis likely in the next several months.  At that time, we would discontinue  the Plavix and aspirin.  Discussed stroke risks with family  Diabetes with hypertension - Medications reviewed.  We will follow along  Candee Furbish, MD

## 2017-08-23 DIAGNOSIS — I5033 Acute on chronic diastolic (congestive) heart failure: Secondary | ICD-10-CM

## 2017-08-23 LAB — URINALYSIS, ROUTINE W REFLEX MICROSCOPIC
Bilirubin Urine: NEGATIVE
GLUCOSE, UA: NEGATIVE mg/dL
Hgb urine dipstick: NEGATIVE
KETONES UR: NEGATIVE mg/dL
NITRITE: NEGATIVE
PH: 5 (ref 5.0–8.0)
Protein, ur: NEGATIVE mg/dL
Specific Gravity, Urine: 1.009 (ref 1.005–1.030)

## 2017-08-23 LAB — GLUCOSE, CAPILLARY
GLUCOSE-CAPILLARY: 36 mg/dL — AB (ref 65–99)
GLUCOSE-CAPILLARY: 48 mg/dL — AB (ref 65–99)
GLUCOSE-CAPILLARY: 81 mg/dL (ref 65–99)
GLUCOSE-CAPILLARY: 149 mg/dL — AB (ref 65–99)
Glucose-Capillary: 71 mg/dL (ref 65–99)
Glucose-Capillary: 69 mg/dL (ref 65–99)
Glucose-Capillary: 139 mg/dL — ABNORMAL HIGH (ref 65–99)
Glucose-Capillary: 166 mg/dL — ABNORMAL HIGH (ref 65–99)
Glucose-Capillary: 53 mg/dL — ABNORMAL LOW (ref 65–99)

## 2017-08-23 LAB — BASIC METABOLIC PANEL
Anion gap: 11 (ref 5–15)
BUN: 54 mg/dL — AB (ref 6–20)
CHLORIDE: 105 mmol/L (ref 101–111)
CO2: 21 mmol/L — ABNORMAL LOW (ref 22–32)
CREATININE: 3.61 mg/dL — AB (ref 0.44–1.00)
Calcium: 8.4 mg/dL — ABNORMAL LOW (ref 8.9–10.3)
GFR, EST AFRICAN AMERICAN: 13 mL/min — AB (ref >60)
GFR, EST NON AFRICAN AMERICAN: 11 mL/min — AB (ref >60)
Glucose, Bld: 76 mg/dL (ref 65–99)
POTASSIUM: 5.8 mmol/L — AB (ref 3.5–5.1)
SODIUM: 137 mmol/L (ref 135–145)

## 2017-08-23 LAB — CBC
HCT: 24.9 % — ABNORMAL LOW (ref 36.0–46.0)
Hemoglobin: 7.9 g/dL — ABNORMAL LOW (ref 12.0–15.0)
MCH: 27.9 pg (ref 26.0–34.0)
MCHC: 31.7 g/dL (ref 30.0–36.0)
MCV: 88 fL (ref 78.0–100.0)
PLATELETS: 169 10*3/uL (ref 150–400)
RBC: 2.83 MIL/uL — AB (ref 3.87–5.11)
RDW: 13.6 % (ref 11.5–15.5)
WBC: 6.6 10*3/uL (ref 4.0–10.5)

## 2017-08-23 LAB — CREATININE, URINE, RANDOM: CREATININE, URINE: 83.65 mg/dL

## 2017-08-23 LAB — SODIUM, URINE, RANDOM: Sodium, Ur: 31 mmol/L

## 2017-08-23 LAB — HEPARIN LEVEL (UNFRACTIONATED): Heparin Unfractionated: 0.62 IU/mL (ref 0.30–0.70)

## 2017-08-23 MED ORDER — INSULIN DETEMIR 100 UNIT/ML ~~LOC~~ SOLN
6.0000 [IU] | Freq: Two times a day (BID) | SUBCUTANEOUS | Status: DC
  Administered 2017-08-23: 6 [IU] via SUBCUTANEOUS
  Filled 2017-08-23 (×2): qty 0.06

## 2017-08-23 MED ORDER — INSULIN DETEMIR 100 UNIT/ML ~~LOC~~ SOLN
4.0000 [IU] | Freq: Two times a day (BID) | SUBCUTANEOUS | Status: DC
  Filled 2017-08-23 (×2): qty 0.04

## 2017-08-23 MED ORDER — METOLAZONE 5 MG PO TABS
5.0000 mg | ORAL_TABLET | Freq: Every day | ORAL | Status: DC
  Administered 2017-08-23 – 2017-08-29 (×6): 5 mg via ORAL
  Filled 2017-08-23 (×8): qty 1

## 2017-08-23 MED ORDER — FUROSEMIDE 10 MG/ML IJ SOLN
120.0000 mg | Freq: Three times a day (TID) | INTRAMUSCULAR | Status: DC
  Administered 2017-08-23 – 2017-08-26 (×8): 120 mg via INTRAVENOUS
  Filled 2017-08-23 (×3): qty 12
  Filled 2017-08-23: qty 10
  Filled 2017-08-23: qty 12
  Filled 2017-08-23 (×3): qty 10
  Filled 2017-08-23: qty 12
  Filled 2017-08-23 (×2): qty 10
  Filled 2017-08-23: qty 12

## 2017-08-23 NOTE — Progress Notes (Signed)
Inpatient Diabetes Program Recommendations  AACE/ADA: New Consensus Statement on Inpatient Glycemic Control (2015)  Target Ranges:  Prepandial:   less than 140 mg/dL      Peak postprandial:   less than 180 mg/dL (1-2 hours)      Critically ill patients:  140 - 180 mg/dL   Lab Results  Component Value Date   GLUCAP 69 08/23/2017   HGBA1C 9.6 (H) 03/01/2017    Review of Glycemic Control  Diabetes history: DM2 Outpatient Diabetes medications: Humalog 50/50 22 units am + 18 units pm Current orders for Inpatient glycemic control: Levemir 6 units bid + Novolog 3 units tid + Novolog sensitive moderate tid   Inpatient Diabetes Program Recommendations:   Noted hypoglycemia and agree with further reduction of insulin.  Thank you, Nani Gasser. Daison Braxton, RN, MSN, CDE  Diabetes Coordinator Inpatient Glycemic Control Team Team Pager [phone] (8am-5pm) 08/23/2017 8:11 AM

## 2017-08-23 NOTE — Progress Notes (Signed)
Hypoglycemic Event  CBG: 36  Treatment: 15 GM carbohydrate snack x 2  Symptoms: Pale and Sweaty  Follow-up CBG: Time:0200; 4373 CBG Result:48; 71  Possible Reasons for Event: Inadequate meal intake  Comments/MD notified:n/a    Glenda [name]

## 2017-08-23 NOTE — Progress Notes (Signed)
PROGRESS NOTE    Glenda [name]  WIO:[id number] DOB: [birth year] DOA: 08/20/2017 PCP: Angelica Pou, MD   Brief Narrative:  Glenda [name] is an 84 y.o. female with medical history significant of grade 3 diastolic HF presenting with 1 day complaint of worsening sob. The problem has been persistent and gradually getting worse. Activity makes her sob worse. She denies any palpitations or sick contacts. Also admits to cough but non productive and no fevers. Denies any hemoptysis.  In the ED Pt had a CXR which showed cardiomegaly and pulmonary edema and elevated BNP on evaluation. She was also found to by hypoxic saturations into the 70s on admission.  She was admitted for Acute Respiratory Failure with Hypoxia secondary to Acute Decompensation of Chronic Diastolic Heart Failure.  She is currently getting diuresed and is improving slowly but not urinating much.   Her troponin increased and was placed on a Heparin drip.  Cardiology was consulted for further evaluation and recommended continuing heparin drip for 48 hours and it will stop tonight.  They believe this is demand ischemia and are recommending cardiac catheterization at a later date.  Cardiology also increased diuresis and Nephrology was also consulted for further evaluation of acute on chronic kidney disease.  Patient went to atrial fibrillation briefly yesterday however has not converted back to normal sinus rhythm.  Because the patient is not adequately diuresing and continues to be slightly Dyspneic on exertion with extremely poor urine output, her Lasix has been increased to 120 mg TID and Metolazone 5 mg po Daily has been added by Cardiology.   Assessment & Plan:   Active Problems:   Uncontrolled hypertension   CAD (coronary artery disease)   GASTROESOPHAGEAL REFLUX DISEASE   Acute on chronic diastolic CHF (congestive heart failure) (HCC)   Type II diabetes mellitus with neurological manifestations, uncontrolled (HCC)  Hypoxia  Acute Respiratory Failure with Hypoxia 2/2 to Acute Decompensated CHF Exacerbation -Was Hypoxic on Admission -Continue with pulse oximetry and maintain O2 saturations greater than 92% -Continue supplemental oxygen as necessary and wean as tolerated -We will need walk screen prior to discharge to assess for home O2  -Repeat CXR showed low lung volumes with cardiomegaly and vascular congestion with minimal edema.  There is also small pleural effusions. -Cardiology increase diuresis to 80 mg IV 3 times daily but Nephrology further increased it to 120 mg IV TID and Cardiology adding Metolazone -Repeat CXR in AM  Acute on Chronic Diastolic CHF -BNP was 426.8 on Admission  -Chest x-ray features with CHF and appears volume overloaded on examination -Holding B blocker given acute exacerbation and will resume once adequately diuresed -Cycle Troponins to rule out new ischemia; POC Troponin was 0.04 but could be in the setting of CKD -Continue blood pressure management -Strict I's/O's, Daily Weights, SLIV, and Fluid Restriction of 1500 mL -Patient is +1.421 L and Weight is Up 10 lbs since admission -Patient is having very poor urine output -IV Lasix was increased to 120 mg 3 times daily by Nephrology and Cardiology added Metolazone 5 mg p.o. daily -C/w Hydralazine 25 mg every 8 hours and Isosorbide Mononitrate 30 mg p.o. Daily -Repeat Echocardiogram showed EF of 60 to 65% with grade 2 diastolic dysfunction along with elevated central venous pressure in the inferior vena cava -Cardiology and Nephrology consulted and appreciate recommendations  Elevated Troponins/Type II NSTEMI -Troponin peaked at 1.73 now trending down to 1.56 -Cardiology consulted for further evaluation and recommendations -We will continue her Aspirin, Plavix, beta blockers  been held due to acute diastolic dysfunction -She was placed on a heparin drip and will continue for 48 hours per cardiology recommendations and to  stop today -Cardiology feels like this is because of supply demand mismatch in the setting of LVH which is severe -Recommending outpatient catheterization at a later date and no invasive work-up at this time  Paroxysmal Atrial Fibrillation -Brief episode of atrial fibrillation this morning is now back in sinus rhythm. -Anticoagulation with Heparin gtt to stop today . -Per Cardiology if she continues to go back into atrial fibrillation will need long-term anticoagulation with warfarin she is getting close to dialysis.  -C/w Aspirin, Plavix.  If it returns she will be placed on Warfarin at that time and Plavix and Aspirin will be discontinued per Cardiology recommendations  Uncontrolled Hypertension -BP was 153/57 earlier and then improved to 121/45 -Continue with Amlodipine 10 mg p.o. daily, Hydralazine 25 mg p.o. every 8 hours, Isosorbide Mononitrate 30 mg p.o. daily, as well as Clonidine 0.2 mg p.o. twice daily -Continue to monitor blood pressure trends  CAD -Continue with Aspirin 81 mg p.o. daily, Clopidogrel 75 mg p.o. daily, Hydralazine 25 mg every 8, Isosorbide Mononitrate 30 mg p.o. daily, and Pravastatin 80 mg p.o. Daily -Troponin elevated to 1.73 and trended down to 1.56 -Echo showed EF of 60 to 65% with no wall motion abnormality -Cardiology consulted and recommended that Heparin gtt be stopped today given that it will be 48 hours  GERD -Continue with Pantoprazole 40 mg p.o. Daily  Hx of Esophageal Stricture -S/p Dilatation On 08/18/17 -No Active Issues at this Time.   Type II Diabetes Mellitus with Neurological Manifestation -Discontinued home medication regimen with NovoLog 70/30 mix 20 units subcu with breakfast and 18 units subcu with supper given her Hypoglycemia  -Added Moderate NovoLog SSI AC yesterday but will decrease to Sensitive scale; Per Diabetes coordinator recommendations added Levemir 10 units twice daily, NovoLog 3 units 3 times daily no coverage if she eats  greater than 50%  -Will reduce Levemir 10 units BID to 4 units given overnight Hypoglycemia Again and continued Low Blood Sugars -CBG's ranging from 36-166 -C/w Carb Modified Diet with 1500 mL Fluid Restriction  AKI on CKD Stage 4/5 -Likely worsened from volume overload in the setting of CHF -Baseline creatinine is around 2.5-2.9 -BUN and creatinine on admission was 55/3.65 and is now  54/3.61 -IV Lasix increased from 80 mg TID to 120 mg TID by Nephrology -Cardiology added Metolazone 5 mg po Daily  Avoid nephrotoxic medications possible; patient is not on an ACE or ARB -Getting close to dialysis but is not on it yet.  Has an AV fistula placed in the left forearm recently (3/21) for the need of Dialysis eventually -Repeat CMP in the AM -Nephrology Dr. Jonnie Finner consulted and following. Feels she has no strong indication for dialysis yet but will continue to follow  Normocytic Anemia/Anemia of Chronic Disease -In the setting of CKD -Hb/Hct on Admission was 8.7/27.6 is now 7.9/24.9 -Check Anemia Panel in the AM -Continue to monitor for signs and symptoms of bleeding -Repeat CBC in a.m.  Hyperlipidemia -Continue with Pravastatin 80 mg p.o. Daily  History of Breast cancer s/p XRT -Currently not an active issue  OSA -No longer using a CPAP  History of a CVA -Continue with Aspirin 81 mg p.o. daily, Clopidogrel 75 mg p.o. daily, and Pravastatin 80 mg p.o. nightly  DVT prophylaxis: Heparin 5,000 units sq q8h Code Status: FULL CODE Family Communication: Discussed with  Daughter at bedside Disposition Plan: Remain inpatient with current treatment and PT recommending no PT Follow up as Pt declining HHPT  Consultants:   None   Procedures:  ECHOCARDIOGRAM ------------------------------------------------------------------- Study Conclusions  - Left ventricle: The cavity size was normal. There was moderate   concentric hypertrophy. Systolic function was normal. The   estimated  ejection fraction was in the range of 60% to 65%. Wall   motion was normal; there were no regional wall motion   abnormalities. Features are consistent with a pseudonormal left   ventricular filling pattern, with concomitant abnormal relaxation   and increased filling pressure (grade 2 diastolic dysfunction).   Doppler parameters are consistent with elevated ventricular   end-diastolic filling pressure. - Mitral valve: Calcified annulus. Mildly thickened leaflets .   There was moderate regurgitation. - Left atrium: The atrium was moderately dilated. - Right ventricle: The cavity size was mildly dilated. Wall   thickness was normal. Systolic function was normal. - Tricuspid valve: There was mild regurgitation. - Pulmonary arteries: Systolic pressure was moderately increased.   PA peak pressure: 47 mm Hg (S). - Inferior vena cava: The vessel was dilated. The respirophasic   diameter changes were blunted (< 50%), consistent with elevated   central venous pressure. - Pericardium, extracardiac: There was no pericardial effusion.   Antimicrobials:  Anti-infectives (From admission, onward)   None     Subjective: Skin examined at bedside stated she is doing better however her daughter has noticed her urine output is not adequate.  Continues to be dyspneic on exertion.  Also had her blood sugar dropped yesterday night as well.  No chest pain, shortness of breath, nausea or vomiting.  She does state that however she gets dizzy on standing.  No other concerning complaints at this time.  Objective: Vitals:   08/23/17 0500 08/23/17 0514 08/23/17 1048 08/23/17 1404  BP:  (!) 153/57  (!) 121/45  Pulse:  (!) 59  (!) 51  Resp:  20  18  Temp:  98.1 F (36.7 C)  (!) 97.4 F (36.3 C)  TempSrc:  Oral  Oral  SpO2:  93% 100% 99%  Weight: 74.1 kg (163 lb 5.8 oz)     Height:        Intake/Output Summary (Last 24 hours) at 08/23/2017 1702 Last data filed at 08/23/2017 1501 Gross per 24 hour    Intake 699.7 ml  Output 600 ml  Net 99.7 ml   Filed Weights   08/20/17 1347 08/21/17 0408 08/23/17 0500  Weight: 72.1 kg (158 lb 15.2 oz) 72.3 kg (159 lb 6.3 oz) 74.1 kg (163 lb 5.8 oz)   Examination: Physical Exam:  Constitutional: Well-nourished, well-developed elderly African-American female is currently in no acute distress peers, comfortable sitting in chair bedside Eyes: Sclera are anicteric. Lids normal ENMT: External ears and nose appear normal.  Grossly normal hearing Neck: Supple with no appreciable no JVD Respiratory: Diminished to auscultation with labored breathing.  No appreciable wheezing, rales, rhonchi. Cardiovascular: Regular rate and rhythm.  No appreciable murmur/rub/gallops; has 1+ lower extremity edema Abdomen: Soft, nontender, distended secondary body habitus.  Bowel sounds present GU: Deferred Musculoskeletal: No clubbing or cyanosis.  Left arm AV fistula Skin: Warm and dry.  No appreciable rashes or lesions on limited skin relation Neurologic: Cranial nerves II through XII grossly intact with no appreciable focal deficit Psychiatric: Normal mood and affect.  Intact judgment and insight.  Data Reviewed: I have personally reviewed following labs and imaging studies  CBC:  Recent Labs  Lab 08/20/17 1509 08/22/17 0521 08/23/17 0549  WBC 7.0 8.0 6.6  NEUTROABS  --  4.9  --   HGB 8.7* 8.2* 7.9*  HCT 27.6* 26.1* 24.9*  MCV 88.2 87.9 88.0  PLT 168 171 712   Basic Metabolic Panel: Recent Labs  Lab 08/20/17 0838 08/20/17 1509 08/21/17 0548 08/22/17 0521 08/23/17 0549  NA 140  --  142 137 137  K 3.9  --  3.9 4.5 5.8*  CL 104  --  107 105 105  CO2 22  --  26 24 21*  GLUCOSE 362*  --  67 44* 76  BUN 55*  --  54* 53* 54*  CREATININE 3.65* 3.43* 3.58* 3.56* 3.61*  CALCIUM 8.7*  --  8.7* 8.5* 8.4*  MG  --   --   --  1.5*  --   PHOS  --   --   --  2.5  --    GFR: Estimated Creatinine Clearance: 12.6 mL/min (A) (by C-G formula based on SCr of 3.61  mg/dL (H)). Liver Function Tests: Recent Labs  Lab 08/20/17 0838 08/22/17 0521  AST 19 33  ALT 9* 13*  ALKPHOS 93 82  BILITOT 0.7 0.8  PROT 7.0 6.5  ALBUMIN 3.2* 2.8*   No results for input(s): LIPASE, AMYLASE in the last 168 hours. No results for input(s): AMMONIA in the last 168 hours. Coagulation Profile: Recent Labs  Lab 08/21/17 1927  INR 1.00   Cardiac Enzymes: Recent Labs  Lab 08/20/17 0838 08/21/17 1807 08/22/17 0016 08/22/17 0521  TROPONINI 0.04* 1.73* 1.48* 1.56*   BNP (last 3 results) No results for input(s): PROBNP in the last 8760 hours. HbA1C: No results for input(s): HGBA1C in the last 72 hours. CBG: Recent Labs  Lab 08/23/17 0201 08/23/17 0223 08/23/17 0730 08/23/17 0940 08/23/17 1128  GLUCAP 48* 71 69 139* 166*   Lipid Profile: No results for input(s): CHOL, HDL, LDLCALC, TRIG, CHOLHDL, LDLDIRECT in the last 72 hours. Thyroid Function Tests: No results for input(s): TSH, T4TOTAL, FREET4, T3FREE, THYROIDAB in the last 72 hours. Anemia Panel: Recent Labs    08/22/17 0521  VITAMINB12 206  FOLATE 14.6  FERRITIN 672*  TIBC 175*  IRON 13*  RETICCTPCT 2.8   Sepsis Labs: No results for input(s): PROCALCITON, LATICACIDVEN in the last 168 hours.  No results found for this or any previous visit (from the past 240 hour(s)).   Radiology Studies: Dg Chest 2 View  Result Date: 08/22/2017 CLINICAL DATA:  CHF with shortness of breath EXAM: CHEST - 2 VIEW COMPARISON:  08/22/2017, 08/20/2017, 03/06/2017 FINDINGS: Low lung volumes. Tiny pleural effusions. Mild cardiomegaly with vascular congestion, overall decreased. Decreased pulmonary edema with minimal residual. Aortic atherosclerosis. No pneumothorax. IMPRESSION: 1. Low lung volumes. 2. Cardiomegaly with vascular congestion and minimal edema, overall improved since prior radiograph 3. Small pleural effusions. Electronically Signed   By: Donavan Foil M.D.   On: 08/22/2017 20:00   Dg Chest Port 1  View  Result Date: 08/22/2017 CLINICAL DATA:  Shortness of breath. EXAM: PORTABLE CHEST 1 VIEW COMPARISON:  Radiograph of August 20, 2017. FINDINGS: Stable cardiomegaly with bilateral pulmonary edema. Atherosclerosis of thoracic aorta is noted. No pneumothorax or pleural effusion is noted. Bony thorax is unremarkable. IMPRESSION: Stable cardiomegaly with bilateral pulmonary edema. Aortic Atherosclerosis (ICD10-I70.0). Electronically Signed   By: Marijo Conception, M.D.   On: 08/22/2017 07:22   Scheduled Meds: . amLODipine  10 mg Oral Daily  . aspirin EC  81 mg Oral Daily  . citalopram  20 mg Oral Daily  . cloNIDine  0.2 mg Oral BID  . clopidogrel  75 mg Oral Daily  . feeding supplement (NEPRO CARB STEADY)  237 mL Oral BID BM  . hydrALAZINE  25 mg Oral Q8H  . insulin aspart  0-9 Units Subcutaneous TID WC  . insulin aspart  3 Units Subcutaneous TID WC  . insulin detemir  6 Units Subcutaneous BID  . isosorbide mononitrate  30 mg Oral Daily  . metolazone  5 mg Oral Daily  . pantoprazole  40 mg Oral Daily  . pravastatin  80 mg Oral Daily  . sodium chloride flush  3 mL Intravenous Q12H   Continuous Infusions: . sodium chloride    . furosemide      LOS: 3 days   Kerney Elbe, DO [facility] Pager [phone]  If 7PM-7AM, please contact night-coverage [email or web address] Password [email or web address] 08/23/2017, 5:02 PM

## 2017-08-23 NOTE — Discharge Instructions (Signed)
Hypoglycemia Hypoglycemia is when the sugar (glucose) level in the blood is too low. Symptoms of low blood sugar may include:  Feeling: ? Hungry. ? Worried or nervous (anxious). ? Sweaty and clammy. ? Confused. ? Dizzy. ? Sleepy. ? Sick to your stomach (nauseous).  Having: ? A fast heartbeat. ? A headache. ? A change in your vision. ? Jerky movements that you cannot control (seizure). ? Nightmares. ? Tingling or no feeling (numbness) around the mouth, lips, or tongue.  Having trouble with: ? Talking. ? Paying attention (concentrating). ? Moving (coordination). ? Sleeping.  Shaking.  Passing out (fainting).  Getting upset easily (irritability).  Low blood sugar can happen to people who have diabetes and people who do not have diabetes. Low blood sugar can happen quickly, and it can be an emergency. Treating Low Blood Sugar Low blood sugar is often treated by eating or drinking something sugary right away. If you can think clearly and swallow safely, follow the 15:15 rule:  Take 15 grams of a fast-acting carb (carbohydrate). Some fast-acting carbs are: ? 1 tube of glucose gel. ? 3 sugar tablets (glucose pills). ? 6-8 pieces of hard candy. ? 4 oz (120 mL) of fruit juice. ? 4 oz (120 mL) of regular (not diet) soda.  Check your blood sugar 15 minutes after you take the carb.  If your blood sugar is still at or below 70 mg/dL (3.9 mmol/L), take 15 grams of a carb again.  If your blood sugar does not go above 70 mg/dL (3.9 mmol/L) after 3 tries, get help right away.  After your blood sugar goes back to normal, eat a meal or a snack within 1 hour.  Treating Very Low Blood Sugar If your blood sugar is at or below 54 mg/dL (3 mmol/L), you have very low blood sugar (severe hypoglycemia). This is an emergency. Do not wait to see if the symptoms will go away. Get medical help right away. Call your local emergency services (911 in the [place] Do not drive yourself to the  hospital. If you have very low blood sugar and you cannot eat or drink, you may need a glucagon shot (injection). A family member or friend should learn how to check your blood sugar and how to give you a glucagon shot. Ask your doctor if you need to have a glucagon shot kit at home. Follow these instructions at home: General instructions  Avoid any diets that cause you to not eat enough food. Talk with your doctor before you start any new diet.  Take over-the-counter and prescription medicines only as told by your doctor.  Limit alcohol to no more than 1 drink per day for nonpregnant women and 2 drinks per day for men. One drink equals 12 oz of beer, 5 oz of wine, or 1 oz of hard liquor.  Keep all follow-up visits as told by your doctor. This is important. If You Have Diabetes:   Make sure you know the symptoms of low blood sugar.  Always keep a source of sugar with you, such as: ? Sugar. ? Sugar tablets. ? Glucose gel. ? Fruit juice. ? Regular soda (not diet soda). ? Milk. ? Hard candy. ? Honey.  Take your medicines as told.  Follow your exercise and meal plan. ? Eat on time. Do not skip meals. ? Follow your sick day plan when you cannot eat or drink normally. Make this plan ahead of time with your doctor.  Check your blood sugar as often  as told by your doctor. Always check before and after exercise.  Share your diabetes care plan with: ? Your work or school. ? People you live with.  Check your pee (urine) for ketones: ? When you are sick. ? As told by your doctor.  Carry a card or wear jewelry that says you have diabetes. If You Have Low Blood Sugar From Other Causes:   Check your blood sugar as often as told by your doctor.  Follow instructions from your doctor about what you cannot eat or drink. Contact a doctor if:  You have trouble keeping your blood sugar in your target range.  You have low blood sugar often. Get help right away if:  You still have  symptoms after you eat or drink something sugary.  Your blood sugar is at or below 54 mg/dL (3 mmol/L).  You have jerky movements that you cannot control.  You pass out. These symptoms may be an emergency. Do not wait to see if the symptoms will go away. Get medical help right away. Call your local emergency services (911 in the [place] Do not drive yourself to the hospital. This information is not intended to replace advice given to you by your health care provider. Make sure you discuss any questions you have with your health care provider. Document Released: 07/07/2009 Document Revised: 09/18/2015 Document Reviewed: 05/16/2015 Elsevier Interactive Patient Education  2018 [employer]. Hypoglycemia Hypoglycemia is when the sugar (glucose) level in the blood is too low. Symptoms of low blood sugar may include:  Feeling: ? Hungry. ? Worried or nervous (anxious). ? Sweaty and clammy. ? Confused. ? Dizzy. ? Sleepy. ? Sick to your stomach (nauseous).  Having: ? A fast heartbeat. ? A headache. ? A change in your vision. ? Jerky movements that you cannot control (seizure). ? Nightmares. ? Tingling or no feeling (numbness) around the mouth, lips, or tongue.  Having trouble with: ? Talking. ? Paying attention (concentrating). ? Moving (coordination). ? Sleeping.  Shaking.  Passing out (fainting).  Getting upset easily (irritability).  Low blood sugar can happen to people who have diabetes and people who do not have diabetes. Low blood sugar can happen quickly, and it can be an emergency. Treating Low Blood Sugar Low blood sugar is often treated by eating or drinking something sugary right away. If you can think clearly and swallow safely, follow the 15:15 rule:  Take 15 grams of a fast-acting carb (carbohydrate). Some fast-acting carbs are: ? 1 tube of glucose gel. ? 3 sugar tablets (glucose pills). ? 6-8 pieces of hard candy. ? 4 oz (120 mL) of fruit juice. ? 4 oz (120  mL) of regular (not diet) soda.  Check your blood sugar 15 minutes after you take the carb.  If your blood sugar is still at or below 70 mg/dL (3.9 mmol/L), take 15 grams of a carb again.  If your blood sugar does not go above 70 mg/dL (3.9 mmol/L) after 3 tries, get help right away.  After your blood sugar goes back to normal, eat a meal or a snack within 1 hour.  Treating Very Low Blood Sugar If your blood sugar is at or below 54 mg/dL (3 mmol/L), you have very low blood sugar (severe hypoglycemia). This is an emergency. Do not wait to see if the symptoms will go away. Get medical help right away. Call your local emergency services (911 in the [place] Do not drive yourself to the hospital. If you have very low  blood sugar and you cannot eat or drink, you may need a glucagon shot (injection). A family member or friend should learn how to check your blood sugar and how to give you a glucagon shot. Ask your doctor if you need to have a glucagon shot kit at home. Follow these instructions at home: General instructions  Avoid any diets that cause you to not eat enough food. Talk with your doctor before you start any new diet.  Take over-the-counter and prescription medicines only as told by your doctor.  Limit alcohol to no more than 1 drink per day for nonpregnant women and 2 drinks per day for men. One drink equals 12 oz of beer, 5 oz of wine, or 1 oz of hard liquor.  Keep all follow-up visits as told by your doctor. This is important. If You Have Diabetes:   Make sure you know the symptoms of low blood sugar.  Always keep a source of sugar with you, such as: ? Sugar. ? Sugar tablets. ? Glucose gel. ? Fruit juice. ? Regular soda (not diet soda). ? Milk. ? Hard candy. ? Honey.  Take your medicines as told.  Follow your exercise and meal plan. ? Eat on time. Do not skip meals. ? Follow your sick day plan when you cannot eat or drink normally. Make this plan ahead of time with  your doctor.  Check your blood sugar as often as told by your doctor. Always check before and after exercise.  Share your diabetes care plan with: ? Your work or school. ? People you live with.  Check your pee (urine) for ketones: ? When you are sick. ? As told by your doctor.  Carry a card or wear jewelry that says you have diabetes. If You Have Low Blood Sugar From Other Causes:   Check your blood sugar as often as told by your doctor.  Follow instructions from your doctor about what you cannot eat or drink. Contact a doctor if:  You have trouble keeping your blood sugar in your target range.  You have low blood sugar often. Get help right away if:  You still have symptoms after you eat or drink something sugary.  Your blood sugar is at or below 54 mg/dL (3 mmol/L).  You have jerky movements that you cannot control.  You pass out. These symptoms may be an emergency. Do not wait to see if the symptoms will go away. Get medical help right away. Call your local emergency services (911 in the [place] Do not drive yourself to the hospital. This information is not intended to replace advice given to you by your health care provider. Make sure you discuss any questions you have with your health care provider. Document Released: 07/07/2009 Document Revised: 09/18/2015 Document Reviewed: 05/16/2015 Elsevier Interactive Patient Education  [employer].

## 2017-08-23 NOTE — Progress Notes (Addendum)
Progress Note  Patient Name: Glenda [name] Date of Encounter: 08/23/2017  Primary Cardiologist: Ena Dawley, MD   Subjective   No chest discomfort. Mild DOE when up ambulation, improved since admission. Pt states "I feel great today".  Inpatient Medications    Scheduled Meds: . amLODipine  10 mg Oral Daily  . aspirin EC  81 mg Oral Daily  . citalopram  20 mg Oral Daily  . cloNIDine  0.2 mg Oral BID  . clopidogrel  75 mg Oral Daily  . feeding supplement (NEPRO CARB STEADY)  237 mL Oral BID BM  . furosemide  80 mg Intravenous TID  . hydrALAZINE  25 mg Oral Q8H  . insulin aspart  0-9 Units Subcutaneous TID WC  . insulin aspart  3 Units Subcutaneous TID WC  . insulin detemir  6 Units Subcutaneous BID  . isosorbide mononitrate  30 mg Oral Daily  . pantoprazole  40 mg Oral Daily  . pravastatin  80 mg Oral Daily  . sodium chloride flush  3 mL Intravenous Q12H   Continuous Infusions: . sodium chloride    . heparin 1,000 Units/hr (08/22/17 2029)   PRN Meds: sodium chloride, acetaminophen, famotidine, ondansetron (ZOFRAN) IV, simethicone, sodium chloride flush   Vital Signs    Vitals:   08/22/17 1302 08/22/17 2037 08/23/17 0500 08/23/17 0514  BP: (!) 128/52 (!) 137/55  (!) 153/57  Pulse: (!) 59 63  (!) 59  Resp:  18  20  Temp: 97.9 F (36.6 C) 98.3 F (36.8 C)  98.1 F (36.7 C)  TempSrc: Oral   Oral  SpO2: 92% 94%  93%  Weight:   163 lb 5.8 oz (74.1 kg)   Height:        Intake/Output Summary (Last 24 hours) at 08/23/2017 0922 Last data filed at 08/23/2017 0824 Gross per 24 hour  Intake 489.53 ml  Output 400 ml  Net 89.53 ml   Filed Weights   08/20/17 1347 08/21/17 0408 08/23/17 0500  Weight: 158 lb 15.2 oz (72.1 kg) 159 lb 6.3 oz (72.3 kg) 163 lb 5.8 oz (74.1 kg)    Telemetry    Sinus rhythm around 60 bpm - Personally Reviewed  ECG    No new tracings - Personally Reviewed  Physical Exam   GEN: No acute distress.   Neck: No JVD Cardiac: RRR,  no murmurs, rubs, or gallops.  Respiratory: Clear to auscultation bilaterally. GI: Soft, nontender, non-distended  MS: trace pretibial edema; No deformity. Neuro:  Nonfocal  Psych: Normal affect   Labs    Chemistry Recent Labs  Lab 08/20/17 6295  08/21/17 0548 08/22/17 0521 08/23/17 0549  NA 140  --  142 137 137  K 3.9  --  3.9 4.5 5.8*  CL 104  --  107 105 105  CO2 22  --  26 24 21*  GLUCOSE 362*  --  67 44* 76  BUN 55*  --  54* 53* 54*  CREATININE 3.65*   < > 3.58* 3.56* 3.61*  CALCIUM 8.7*  --  8.7* 8.5* 8.4*  PROT 7.0  --   --  6.5  --   ALBUMIN 3.2*  --   --  2.8*  --   AST 19  --   --  33  --   ALT 9*  --   --  13*  --   ALKPHOS 93  --   --  82  --   BILITOT 0.7  --   --  0.8  --   GFRNONAA 11*   < > 11* 11* 11*  GFRAA 12*   < > 13* 13* 13*  ANIONGAP 14  --  9 8 11    < > = values in this interval not displayed.     Hematology Recent Labs  Lab 08/20/17 1509 08/22/17 0521 08/23/17 0549  WBC 7.0 8.0 6.6  RBC 3.13* 2.97*  2.97* 2.83*  HGB 8.7* 8.2* 7.9*  HCT 27.6* 26.1* 24.9*  MCV 88.2 87.9 88.0  MCH 27.8 27.6 27.9  MCHC 31.5 31.4 31.7  RDW 13.2 13.5 13.6  PLT 168 171 169    Cardiac Enzymes Recent Labs  Lab 08/20/17 0838 08/21/17 1807 08/22/17 0016 08/22/17 0521  TROPONINI 0.04* 1.73* 1.48* 1.56*   No results for input(s): TROPIPOC in the last 168 hours.   BNP Recent Labs  Lab 08/20/17 0838  BNP 755.4*     DDimer No results for input(s): DDIMER in the last 168 hours.   Radiology    Dg Chest 2 View  Result Date: 08/22/2017 CLINICAL DATA:  CHF with shortness of breath EXAM: CHEST - 2 VIEW COMPARISON:  08/22/2017, 08/20/2017, 03/06/2017 FINDINGS: Low lung volumes. Tiny pleural effusions. Mild cardiomegaly with vascular congestion, overall decreased. Decreased pulmonary edema with minimal residual. Aortic atherosclerosis. No pneumothorax. IMPRESSION: 1. Low lung volumes. 2. Cardiomegaly with vascular congestion and minimal edema, overall  improved since prior radiograph 3. Small pleural effusions. Electronically Signed   By: Donavan Foil M.D.   On: 08/22/2017 20:00   Dg Chest Port 1 View  Result Date: 08/22/2017 CLINICAL DATA:  Shortness of breath. EXAM: PORTABLE CHEST 1 VIEW COMPARISON:  Radiograph of August 20, 2017. FINDINGS: Stable cardiomegaly with bilateral pulmonary edema. Atherosclerosis of thoracic aorta is noted. No pneumothorax or pleural effusion is noted. Bony thorax is unremarkable. IMPRESSION: Stable cardiomegaly with bilateral pulmonary edema. Aortic Atherosclerosis (ICD10-I70.0). Electronically Signed   By: Marijo Conception, M.D.   On: 08/22/2017 07:22    Cardiac Studies   Echocardiogram 08/21/17 Study Conclusions  - Left ventricle: The cavity size was normal. There was moderate concentric hypertrophy. Systolic function was normal. The estimated ejection fraction was in the range of 60% to 65%. Wall motion was normal; there were no regional wall motion abnormalities. Features are consistent with a pseudonormal left ventricular filling pattern, with concomitant abnormal relaxation and increased filling pressure (grade 2 diastolic dysfunction). Doppler parameters are consistent with elevated ventricular end-diastolic filling pressure. - Mitral valve: Calcified annulus. Mildly thickened leaflets . There was moderate regurgitation. - Left atrium: The atrium was moderately dilated. - Right ventricle: The cavity size was mildly dilated. Wall thickness was normal. Systolic function was normal. - Tricuspid valve: There was mild regurgitation. - Pulmonary arteries: Systolic pressure was moderately increased. PA peak pressure: 47 mm Hg (S). - Inferior vena cava: The vessel was dilated. The respirophasic diameter changes were blunted (<50%), consistent with elevated central venous pressure. - Pericardium, extracardiac: There was no pericardial effusion.   Echo 12/2016:  Severe LVH,  EF 65-70%, grade 3 DD, PA peak pressure 51 mmHg, mild-mod MR.   Myocardial perfusion imaging 01/18/2014: Normal test, no ischemia, normal LVEF  Left cardiac cath 2012 ANGIOGRAPHIC DATA:  1. Ventriculography was performed in the RAO projection. Overall systolic  function was well preserved and no segmental abnormalities or contraction  were identified. There was near cavity obliteration. No significant  mitral regurgitation was noted.  2. There was no obvious calcification of the coronary vessels.  3. The left main coronary artery was fairly large and free of critical  disease.  4. The LAD coursed to the apex, wrapped the apex, and supplied a significant  portion of the distal inferior wall. There was a tiny first diagonal  that was free of critical disease. The proximal and mid LAD appeared to  be without significant focal narrowing. The LAD after the major diagonal  which was the second diagonal demonstrates a segmental plaque of about 50-  60%. This was previously graded at 30, but appeared to be only mildly  progressed from the previous study. There is also a diagonal stenosis  that involves likely the bifurcation and is about 50-60% as well. The  diagonal is moderate in size.  5. The circumflex provides a tiny first marginal branch that has about a 50%  area of narrowing but it is very small. There is a second large marginal  branch which bifurcates twice in its distal course. It is a large  caliber vessel and without critical disease. The AV circumflex has a  segmental area of about 60-70% right after the takeoff of a large major  diagonal branch and that is graded a 60-70%. The distal termination of  this is a smaller posterolateral branch which thus supplies a fairly  small area of myocardium.  6. The right coronary artery is a small caliber vessel being 2 mm or less in  size. There is about a 30-40% area of plaquing in the first bend between  the proximal and mid vessel.  There is probably 40% mid narrowing and in  the distal vessel some mild luminal irregularity. The terminal branches  which consist of a posterior descending and several posterolateral  branches are relatively small in caliber.  CONCLUSIONS:  1. Hypodynamic left ventricular function.  2. Scattered disease of the left anterior descending and diagonal as noted  on previous study.  3. Moderate obstruction of the AV circumflex after the large marginal  takeoff.  4. Scattered irregularities of the right coronary artery as described above.   DISPOSITION: The initial plan would be to treat the patient medically. It  is unclear which, if any, of these lesions might be causing significant  angina. We plan to check a D-dimer. We also plan to give the patient  medical therapy. She will be followed by Jeanann Lewandowsky, M.D. and either  Junious Silk, M.D. [facility] or Loretha Brasil. [name], M.D.    Patient Profile     84 y.o. female with a hx of CAD (moderate by cath 8413), Diastolic heart failure, CKD stage V, DM, HLD, HTN, GERD, breast cancer, OSA and stroke 2016 who is being seen today for the evaluation of elevated troponin and diastolic heart failure   Assessment & Plan    Demand ischemia -Troponins elevated with peak 1.73 and trended down in the setting of severe LVH, known moderated CAD and CKD. The patient is chest pain free and breathing is improved. For now avoiding cardiac cath as would likely push her into needing dialysis. Her renal function in slowly worsening. If she does go on dialysis would then consider invasive coronary evaluation. We are treating medically. Heparin has been infusing with plan to discontinue after 48 hours which will be tonight.  -Continue plavix and aspirin.   Acute on chronic diastolic CHF -Echo showed Moderated LVH with normal LV systolic function, EF 24-40%, grade 2 DD, mod MR, Moderately increased pulmonary artery pressure, 47 mmHg -On medical therapy including.  Hydralazine  and  Imdur.  -Breathing is improved, still with mild DOE walking to the bathroom.  -Diuresis has been sluggish with poor urine output. Lasix increased yesterday to 80 mg IV TID, continues to have poor urine output and wt is actually up today.  -Continue current therapy with input from Nephrology.  Chronic kidney disease stage V -recent AV fistula placed, not ready to use.  -Renal function worse than baseline on admission and is not improving.  -UOP poor. Not responsive to increased diuretics.  -Nephrology consulted and pt not felt to have indication for dialysis at present, but will re-evaluate daily.   Paroxysmal atrial fibrillation  -Had about 2 hours of afib on Sunday night. None since.  -Per Dr. Marlou Porch if afib returns would recommend anticoagulation with warfarin as she will likely be on dialysis in the next few months. For now continuing on Plavix and aspirin.   Hypertension -Well controlled. Continue current medications.   Diabetes -Has had some hypoglycemia. Meds adjusted per primary team.   For questions or updates, please contact [place] Please consult [email or web address] for contact info under Cardiology/STEMI.      Signed, Daune Perch, NP  08/23/2017, 9:22 AM    Better today, still with mild DOE, no CP Labs reviewed as above Exam: AAO x 3 , MAE, RRR, no JVD, left arm fistula bruit  A/P:  Demand ischemia  - Trop 1.7 with no obvious CP. Likely because of supply demand mismatch in the setting of LVH severe.   - No invasive work up at this time. Will stop Heparin IV.   - Has increased CV risk  - On DAPT  Acute diastolic HF  - will continue IV lasix 80 TID and ADD METOLAZONE 5mg  QD   - watch creat, Na  - poor UOP  PAF  - NSR now. If returns, warfarin.   DM with HTN  - better control  Candee Furbish, MD

## 2017-08-23 NOTE — Progress Notes (Signed)
ANTICOAGULATION CONSULT NOTE - Follow-Up Consult  Pharmacy Consult for Heparin Indication: chest pain/ACS  No Known Allergies  Patient Measurements: Height: 5\' 6"  (167.6 cm) Weight: 163 lb 5.8 oz (74.1 kg) IBW/kg (Calculated) : 59.3 Heparin Dosing Weight: actual weight  Vital Signs: Temp: 98.1 F (36.7 C) (04/30 0514) Temp Source: Oral (04/30 0514) BP: 153/57 (04/30 0514) Pulse Rate: 59 (04/30 0514)  Labs: Recent Labs    08/20/17 1509 08/21/17 0548 08/21/17 1807 08/21/17 1927 08/22/17 0016 08/22/17 0521 08/22/17 1938 08/23/17 0549  HGB 8.7*  --   --   --   --  8.2*  --  7.9*  HCT 27.6*  --   --   --   --  26.1*  --  24.9*  PLT 168  --   --   --   --  171  --  169  APTT  --   --   --  37*  --   --   --   --   LABPROT  --   --   --  13.1  --   --   --   --   INR  --   --   --  1.00  --   --   --   --   HEPARINUNFRC  --   --   --   --   --  0.37 0.23* 0.62  CREATININE 3.43* 3.58*  --   --   --  3.56*  --  3.61*  TROPONINI  --   --  1.73*  --  1.48* 1.56*  --   --     Estimated Creatinine Clearance: 12.6 mL/min (A) (by C-G formula based on SCr of 3.61 mg/dL (H)).   Medical History: Past Medical History:  Diagnosis Date  . Arthritis   . Breast cancer ([place])   . CAD (coronary artery disease)   . Chest pain   . CHF (congestive heart failure) ([place])   . Chronic kidney disease    stage 4  . Dizziness   . DM (diabetes mellitus) ([place])   . GERD (gastroesophageal reflux disease)   . HLD (hyperlipidemia)   . HTN (hypertension)   . Obesity   . Radiation 09/11/15-10/13/15   42.72 Gy to right breast, 12 Gy boost to right breast  . Shortness of breath dyspnea   . Sleep apnea    no longer uses cpap   . Stroke [facility])    balance issues - walks with walker and uses wheelchair   . UTI (urinary tract infection)     Medications:  Scheduled:  . amLODipine  10 mg Oral Daily  . aspirin EC  81 mg Oral Daily  . citalopram  20 mg Oral Daily  . cloNIDine  0.2 mg Oral BID  .  clopidogrel  75 mg Oral Daily  . feeding supplement (NEPRO CARB STEADY)  237 mL Oral BID BM  . furosemide  80 mg Intravenous TID  . hydrALAZINE  25 mg Oral Q8H  . insulin aspart  0-9 Units Subcutaneous TID WC  . insulin aspart  3 Units Subcutaneous TID WC  . insulin detemir  6 Units Subcutaneous BID  . isosorbide mononitrate  30 mg Oral Daily  . pantoprazole  40 mg Oral Daily  . pravastatin  80 mg Oral Daily  . sodium chloride flush  3 mL Intravenous Q12H   Infusions:  . sodium chloride    . heparin 1,000 Units/hr (08/22/17 2029)   PRN:  sodium chloride, acetaminophen, famotidine, ondansetron (ZOFRAN) IV, simethicone, sodium chloride flush  Assessment: 84 yo female with hx grade 3 heart failure presents with worsening shortness of breath.  Troponins now elevated and Pharmacy consulted to dose IV heparin for r/o ACS.  Cardiology to consult in AM.   Baseline aPTT 13.1, PT/INR 1.00   CBC: Hgb low 7.9, stable , Plts 169  SCr 3.61, CrCl ~12 ml/min  HL 0.62 therapeutic  No bleeding or other issues per RN  Goal of Therapy:  Heparin level 0.3-0.7 units/ml Monitor platelets by anticoagulation protocol: Yes   Plan:   Per card, continue heparin drip for 48 hours from start through evening of 4/30. Orders placed to stop drip at 2100.   Continue heparin drip at  1000 units/hr    Will not obtain another HL due to drip stopping tonight   Monitor for signs and symptoms of bleeding   Royetta Asal, PharmD, BCPS Pager [phone] 08/23/2017 11:42 AM

## 2017-08-23 NOTE — Evaluation (Addendum)
Physical Therapy Evaluation Patient Details Name: Glenda [name] MRN: [id number] DOB: 1933/06/04 Today's Date: 08/23/2017   History of Present Illness  84 y.o. female with medical history significant of grade 3 diastolic HF presenting with 1 day complaint of worsening sob. PMH of CVA with R HP, DM, HTN.  Clinical Impression  Pt admitted with above diagnosis. Pt currently with functional limitations due to the deficits listed below (see PT Problem List). Pt ambulated 15' + 15' + 45' with RW with seated rest breaks, without loss of balance. SaO2 98% on room air with walking, distance limited by fatigue. Pt reports she can walk farther at baseline. Pt will benefit from skilled PT to increase their independence and safety with mobility to allow discharge to the venue listed below.       Follow Up Recommendations No PT follow up(pt plans to exercise 3x/week at her adult day program, she declined HHPT)    Equipment Recommendations  None recommended by PT    Recommendations for Other Services       Precautions / Restrictions Precautions Precautions: Fall Precaution Comments: pt denies falls in past 1 year,   (has AFO at home but hasn't needed it recently) Restrictions Weight Bearing Restrictions: No      Mobility  Bed Mobility               General bed mobility comments: up in recliner  Transfers Overall transfer level: Needs assistance Equipment used: Rolling walker (2 wheeled) Transfers: Sit to/from Stand Sit to Stand: Min assist         General transfer comment: min A to stabilize RW, used armrests to push up from recliner and grab bar to pull up from commode; increased time, uses momentum  Ambulation/Gait Ambulation/Gait assistance: Supervision Ambulation Distance (Feet): 45 Feet(45' + 15' + 15' with seated rest) Assistive device: Rolling walker (2 wheeled) Gait Pattern/deviations: Step-through pattern;Decreased stride length;Decreased dorsiflexion -  right;Steppage Gait velocity: decr   General Gait Details: 52' + 15' + 15' with seated rest, steady, no loss of balance, SaO2 98% on room air, no dyspnea, distance limited by fatigue (family reports pt walks short distances at home, but can walk farther than 45' at baseline, pt feels she's weak from 3 days of bedrest), mild steppage pattern  RLE  Stairs            Wheelchair Mobility    Modified Rankin (Stroke Patients Only)       Balance Overall balance assessment: Modified Independent                                           Pertinent Vitals/Pain Pain Assessment: No/denies pain    Home Living Family/patient expects to be discharged to:: Private residence Living Arrangements: Children Available Help at Discharge: Family;Available 24 hours/day;Friend(s) Type of Home: House Home Access: Ramped entrance     Home Layout: One level Home Equipment: [place] - 2 wheels;Wheelchair - [employer];Tub bench;Toilet riser;Other (comment)(R AFO) Additional Comments: daughter reports pt has had 24/7 supervision for the last 2 years    Prior Function Level of Independence: Needs assistance   Gait / Transfers Assistance Needed: RW in home, WC out of house  ADL's / Homemaking Assistance Needed: family assists with adls  Comments: uses RW     Hand Dominance        Extremity/Trunk Assessment   Upper Extremity Assessment  Upper Extremity Assessment: Overall WFL for tasks assessed(edema R hand)    Lower Extremity Assessment Lower Extremity Assessment: Overall WFL for tasks assessed(h/o R hemiparesis 2* CVA, knee ext +4/5, ankle DF +4/5, senastion intact to light touch)    Cervical / Trunk Assessment Cervical / Trunk Assessment: Normal  Communication   Communication: No difficulties  Cognition Arousal/Alertness: Awake/alert Behavior During Therapy: WFL for tasks assessed/performed Overall Cognitive Status: Within Functional Limits for tasks  assessed                                        General Comments      Exercises     Assessment/Plan    PT Assessment Patient needs continued PT services  PT Problem List Decreased activity tolerance;Decreased mobility       PT Treatment Interventions DME instruction;Gait training;Therapeutic activities;Functional mobility training;Therapeutic exercise    PT Goals (Current goals can be found in the Care Plan section)  Acute Rehab PT Goals Patient Stated Goal: [facility] PT Goal Formulation: With family Time For Goal Achievement: 09/06/17 Potential to Achieve Goals: Good    Frequency Min 3X/week   Barriers to discharge        Co-evaluation               AM-PAC PT "6 Clicks" Daily Activity  Outcome Measure Difficulty turning over in bed (including adjusting bedclothes, sheets and blankets)?: A Little Difficulty moving from lying on back to sitting on the side of the bed? : A Little Difficulty sitting down on and standing up from a chair with arms (e.g., wheelchair, bedside commode, etc,.)?: A Little Help needed moving to and from a bed to chair (including a wheelchair)?: A Little Help needed walking in hospital room?: A Little Help needed climbing 3-5 steps with a railing? : A Lot 6 Click Score: 17    End of Session Equipment Utilized During Treatment: Gait belt Activity Tolerance: Patient tolerated treatment well Patient left: in chair;with call bell/phone within reach;with family/visitor present Nurse Communication: Mobility status PT Visit Diagnosis: Difficulty in walking, not elsewhere classified (R26.2)    Time: 6979-4801 PT Time Calculation (min) (ACUTE ONLY): 42 min   Charges:   PT Evaluation $PT Eval Low Complexity: 1 Low PT Treatments $Gait Training: 8-22 mins $Therapeutic Activity: 8-22 mins   PT G Codes:          Philomena Doheny 08/23/2017, 11:18 AM [phone]

## 2017-08-23 NOTE — Progress Notes (Signed)
Hypoglycemic Event  CBG: 53  Treatment: 15 GM carbohydrate snack  Symptoms: None  Follow-up CBG: Time:1759 CBG Result:81  Possible Reasons for Event: Unknown  Comments/MD notified:Dr Alfredia Ferguson adjusted insulin orders and will hold levemir tonight will report to night shift    Ananias Pilgrim

## 2017-08-23 NOTE — Care Management Important Message (Signed)
Important Message  Patient Details  Name: Tiondra Fang MRN: [id number] Date of Birth: 03/11/1934   Medicare Important Message Given:  Yes    Kerin Salen 08/23/2017, 11:23 [place] Message  Patient Details  Name: Zeena Starkel MRN: [id number] Date of Birth: 02-Dec-1933   Medicare Important Message Given:  Yes    Kerin Salen 08/23/2017, 11:23 AM

## 2017-08-23 NOTE — Progress Notes (Signed)
[facility] Progress Note  Subjective: no new c/o, some DOE  Vitals:   08/23/17 0500 08/23/17 0514 08/23/17 1048 08/23/17 1404  BP:  (!) 153/57  (!) 121/45  Pulse:  (!) 59  (!) 51  Resp:  20  18  Temp:  98.1 F (36.7 C)  (!) 97.4 F (36.3 C)  TempSrc:  Oral  Oral  SpO2:  93% 100% 99%  Weight: 74.1 kg (163 lb 5.8 oz)     Height:        Inpatient medications: . amLODipine  10 mg Oral Daily  . aspirin EC  81 mg Oral Daily  . citalopram  20 mg Oral Daily  . cloNIDine  0.2 mg Oral BID  . clopidogrel  75 mg Oral Daily  . feeding supplement (NEPRO CARB STEADY)  237 mL Oral BID BM  . furosemide  80 mg Intravenous TID  . hydrALAZINE  25 mg Oral Q8H  . insulin aspart  0-9 Units Subcutaneous TID WC  . insulin aspart  3 Units Subcutaneous TID WC  . insulin detemir  6 Units Subcutaneous BID  . isosorbide mononitrate  30 mg Oral Daily  . metolazone  5 mg Oral Daily  . pantoprazole  40 mg Oral Daily  . pravastatin  80 mg Oral Daily  . sodium chloride flush  3 mL Intravenous Q12H   . sodium chloride     sodium chloride, acetaminophen, famotidine, ondansetron (ZOFRAN) IV, simethicone, sodium chloride flush  Exam: Gen eldarly aaf no distress up in chair No jvd or bruits Chest clear bilat to bases no rales or wheezing RRR soft 2/6 sem no s3 Abd soft ntnd no mass or ascites +bs MS no joint effusions or deformity Ext 1+ leg edema, no wounds or ulcers Neuro is alert, Ox 3 , nf  home meds:  norvasc 10/ coreg 12.5 bid/ clonidine 0.2 bid/ hydral 25 tid/ zarox 2.5 qd/ torsemide  100 bid  statin/ ppi/ imdur/ 50-50 insulin/ pepcid/ plavix/ celexa/ asa  prns   Impression: 1 acute on ckd 4 - creat mid 3's now, was in the 2's, not uremic has avf 2 pulm edema - still a bit wet by cxr and having symptoms, not diuresing yet, will ^lasix 120 every 8 hrs given low gfr, also zaroxolyn added per cardiology 3 dm2 on insulin 4 hypertension on several bp meds 5 hx cva 6 hx  diast chf echo here shows normal lvef   Plan - as above, will follow   Kelly Splinter MD [facility] pager [phone]   08/23/2017, 3:09 PM   Recent Labs  Lab 08/21/17 0548 08/22/17 0521 08/23/17 0549  NA 142 137 137  K 3.9 4.5 5.8*  CL 107 105 105  CO2 26 24 21*  GLUCOSE 67 44* 76  BUN 54* 53* 54*  CREATININE 3.58* 3.56* 3.61*  CALCIUM 8.7* 8.5* 8.4*  PHOS  --  2.5  --    Recent Labs  Lab 08/20/17 0838 08/22/17 0521  AST 19 33  ALT 9* 13*  ALKPHOS 93 82  BILITOT 0.7 0.8  PROT 7.0 6.5  ALBUMIN 3.2* 2.8*   Recent Labs  Lab 08/20/17 1509 08/22/17 0521 08/23/17 0549  WBC 7.0 8.0 6.6  NEUTROABS  --  4.9  --   HGB 8.7* 8.2* 7.9*  HCT 27.6* 26.1* 24.9*  MCV 88.2 87.9 88.0  PLT 168 171 169   Iron/TIBC/Ferritin/ %Sat    Component Value Date/Time   IRON 13 (L) 08/22/2017  0521   TIBC 175 (L) 08/22/2017 0521   FERRITIN 672 (H) 08/22/2017 0521   IRONPCTSAT 7 (L) 08/22/2017 7353

## 2017-08-24 ENCOUNTER — Inpatient Hospital Stay (HOSPITAL_COMMUNITY): Payer: Medicare (Managed Care)

## 2017-08-24 ENCOUNTER — Encounter (HOSPITAL_COMMUNITY): Payer: Self-pay

## 2017-08-24 DIAGNOSIS — I480 Paroxysmal atrial fibrillation: Secondary | ICD-10-CM

## 2017-08-24 DIAGNOSIS — J810 Acute pulmonary edema: Secondary | ICD-10-CM

## 2017-08-24 DIAGNOSIS — E875 Hyperkalemia: Secondary | ICD-10-CM

## 2017-08-24 DIAGNOSIS — E1165 Type 2 diabetes mellitus with hyperglycemia: Secondary | ICD-10-CM

## 2017-08-24 DIAGNOSIS — IMO0002 Reserved for concepts with insufficient information to code with codable children: Secondary | ICD-10-CM

## 2017-08-24 DIAGNOSIS — E1129 Type 2 diabetes mellitus with other diabetic kidney complication: Secondary | ICD-10-CM

## 2017-08-24 LAB — CBC WITH DIFFERENTIAL/PLATELET
BASOS ABS: 0 10*3/uL (ref 0.0–0.1)
BASOS PCT: 0 %
EOS PCT: 3 %
Eosinophils Absolute: 0.2 10*3/uL (ref 0.0–0.7)
HEMATOCRIT: 24.1 % — AB (ref 36.0–46.0)
Hemoglobin: 7.6 g/dL — ABNORMAL LOW (ref 12.0–15.0)
Lymphocytes Relative: 23 %
Lymphs Abs: 1.2 10*3/uL (ref 0.7–4.0)
MCH: 28.1 pg (ref 26.0–34.0)
MCHC: 31.5 g/dL (ref 30.0–36.0)
MCV: 89.3 fL (ref 78.0–100.0)
MONO ABS: 0.5 10*3/uL (ref 0.1–1.0)
Monocytes Relative: 9 %
NEUTROS ABS: 3.4 10*3/uL (ref 1.7–7.7)
Neutrophils Relative %: 65 %
Platelets: 172 10*3/uL (ref 150–400)
RBC: 2.7 MIL/uL — ABNORMAL LOW (ref 3.87–5.11)
RDW: 14 % (ref 11.5–15.5)
WBC: 5.3 10*3/uL (ref 4.0–10.5)

## 2017-08-24 LAB — IRON AND TIBC
Iron: 32 ug/dL (ref 28–170)
Saturation Ratios: 19 % (ref 10.4–31.8)
TIBC: 167 ug/dL — AB (ref 250–450)
UIBC: 135 ug/dL

## 2017-08-24 LAB — MAGNESIUM: Magnesium: 2 mg/dL (ref 1.7–2.4)

## 2017-08-24 LAB — RETICULOCYTES
RBC.: 2.7 MIL/uL — AB (ref 3.87–5.11)
RETIC COUNT ABSOLUTE: 105.3 10*3/uL (ref 19.0–186.0)
Retic Ct Pct: 3.9 % — ABNORMAL HIGH (ref 0.4–3.1)

## 2017-08-24 LAB — COMPREHENSIVE METABOLIC PANEL
ALBUMIN: 2.6 g/dL — AB (ref 3.5–5.0)
ALT: 25 U/L (ref 14–54)
AST: 48 U/L — AB (ref 15–41)
Alkaline Phosphatase: 117 U/L (ref 38–126)
Anion gap: 9 (ref 5–15)
BUN: 59 mg/dL — AB (ref 6–20)
CHLORIDE: 104 mmol/L (ref 101–111)
CO2: 24 mmol/L (ref 22–32)
Calcium: 8.5 mg/dL — ABNORMAL LOW (ref 8.9–10.3)
Creatinine, Ser: 4.13 mg/dL — ABNORMAL HIGH (ref 0.44–1.00)
GFR calc Af Amer: 11 mL/min — ABNORMAL LOW (ref >60)
GFR calc non Af Amer: 9 mL/min — ABNORMAL LOW (ref >60)
Glucose, Bld: 245 mg/dL — ABNORMAL HIGH (ref 65–99)
POTASSIUM: 6.2 mmol/L — AB (ref 3.5–5.1)
SODIUM: 137 mmol/L (ref 135–145)
Total Bilirubin: 0.4 mg/dL (ref 0.3–1.2)
Total Protein: 6.5 g/dL (ref 6.5–8.1)

## 2017-08-24 LAB — FOLATE: Folate: 13.2 ng/mL (ref >5.9)

## 2017-08-24 LAB — VITAMIN B12: VITAMIN B 12: 261 pg/mL (ref 180–914)

## 2017-08-24 LAB — GLUCOSE, CAPILLARY
GLUCOSE-CAPILLARY: 219 mg/dL — AB (ref 65–99)
GLUCOSE-CAPILLARY: 51 mg/dL — AB (ref 65–99)
GLUCOSE-CAPILLARY: 132 mg/dL — AB (ref 65–99)
Glucose-Capillary: 227 mg/dL — ABNORMAL HIGH (ref 65–99)
Glucose-Capillary: 184 mg/dL — ABNORMAL HIGH (ref 65–99)

## 2017-08-24 LAB — PHOSPHORUS: Phosphorus: 4.5 mg/dL (ref 2.5–4.6)

## 2017-08-24 LAB — FERRITIN: Ferritin: 496 ng/mL — ABNORMAL HIGH (ref 11–307)

## 2017-08-24 MED ORDER — HEPARIN SODIUM (PORCINE) 5000 UNIT/ML IJ SOLN
5000.0000 [IU] | Freq: Three times a day (TID) | INTRAMUSCULAR | Status: DC
  Administered 2017-08-24 – 2017-08-25 (×3): 5000 [IU] via SUBCUTANEOUS
  Filled 2017-08-24 (×3): qty 1

## 2017-08-24 MED ORDER — SODIUM POLYSTYRENE SULFONATE 15 GM/60ML PO SUSP
30.0000 g | Freq: Once | ORAL | Status: AC
  Administered 2017-08-24: 30 g via ORAL
  Filled 2017-08-24: qty 120

## 2017-08-24 MED ORDER — CYANOCOBALAMIN 1000 MCG/ML IJ SOLN
1000.0000 ug | Freq: Once | INTRAMUSCULAR | Status: AC
  Administered 2017-08-24: 1000 ug via SUBCUTANEOUS
  Filled 2017-08-24: qty 1

## 2017-08-24 NOTE — Progress Notes (Signed)
Physical Therapy Treatment Patient Details Name: Glenda [name] MRN: [id number] DOB: 30-Apr-1933 Today's Date: 08/24/2017    History of Present Illness 84 y.o. female with medical history significant of grade 3 diastolic HF presenting with 1 day complaint of worsening sob. PMH of CVA with R HP, DM, HTN.    PT Comments    Pt continues cooperative and motivated to progress.  Pt ambulated on 2L O2 and maintained sats 95%+ but with HR elevated to 135.   Follow Up Recommendations  No PT follow up     Equipment Recommendations  None recommended by PT    Recommendations for Other Services       Precautions / Restrictions Precautions Precautions: Fall Precaution Comments: pt denies falls in past 1 year,  has AFO at home but hasn't needed it recently Restrictions Weight Bearing Restrictions: No    Mobility  Bed Mobility Overal bed mobility: Needs Assistance Bed Mobility: Supine to Sit     Supine to sit: Min assist     General bed mobility comments: increased time with min assist to bring trunk to upright  Transfers Overall transfer level: Needs assistance Equipment used: Rolling walker (2 wheeled) Transfers: Sit to/from Stand Sit to Stand: Min assist Stand pivot transfers: Min assist       General transfer comment: Pt self cueing for use of UEs.  Initially min guard but increased assist to min with 3rd transfer.  Stand pvt chair to [facility] with RW  Ambulation/Gait Ambulation/Gait assistance: Min guard;Supervision Ambulation Distance (Feet): 33 Feet(and 20) Assistive device: Rolling walker (2 wheeled) Gait Pattern/deviations: Step-through pattern;Decreased stride length;Decreased dorsiflexion - right;Steppage Gait velocity: decr   General Gait Details: 33 + 20 with seated rest.  Pt declines to attempt further   Stairs             Wheelchair Mobility    Modified Rankin (Stroke Patients Only)       Balance                                             Cognition Arousal/Alertness: Awake/alert Behavior During Therapy: WFL for tasks assessed/performed Overall Cognitive Status: Within Functional Limits for tasks assessed                                        Exercises      General Comments        Pertinent Vitals/Pain Pain Assessment: No/denies pain    Home Living                      Prior Function            PT Goals (current goals can now be found in the care plan section) Acute Rehab PT Goals Patient Stated Goal: [facility] PT Goal Formulation: With family Time For Goal Achievement: 09/06/17 Potential to Achieve Goals: Good Progress towards PT goals: Progressing toward goals    Frequency    Min 3X/week      PT Plan Current plan remains appropriate    Co-evaluation              AM-PAC PT "6 Clicks" Daily Activity  Outcome Measure  Difficulty turning over in bed (including adjusting  bedclothes, sheets and blankets)?: A Little Difficulty moving from lying on back to sitting on the side of the bed? : A Little Difficulty sitting down on and standing up from a chair with arms (e.g., wheelchair, bedside commode, etc,.)?: A Little Help needed moving to and from a bed to chair (including a wheelchair)?: A Little Help needed walking in hospital room?: A Little Help needed climbing 3-5 steps with a railing? : A Lot 6 Click Score: 17    End of Session Equipment Utilized During Treatment: Gait belt Activity Tolerance: Patient tolerated treatment well Patient left: Other (comment)(BSC) Nurse Communication: Mobility status PT Visit Diagnosis: Difficulty in walking, not elsewhere classified (R26.2)     Time: 8590-9311 PT Time Calculation (min) (ACUTE ONLY): 30 min  Charges:  $Gait Training: 8-22 mins $Therapeutic Activity: 8-22 mins                    G Codes:       Pg [phone]    Shephanie Romas 08/24/2017, 3:06  PM

## 2017-08-24 NOTE — Progress Notes (Signed)
[facility] Progress Note  Subjective: no new c/o had 900 cc uop but creat up > 4 now and k+ worse getting kayexalate per primary md this am pt w/o new c/o denies any sob or n/v  Vitals:   08/23/17 1404 08/23/17 1845 08/23/17 1848 08/24/17 0448  BP: (!) 121/45   128/70  Pulse: (!) 51   (!) 50  Resp: 18   (!) 24  Temp: (!) 97.4 F (36.3 C)   98.1 F (36.7 C)  TempSrc: Oral   Oral  SpO2: 99% (!) 89% 96% 93%  Weight:      Height:        Inpatient medications: . amLODipine  10 mg Oral Daily  . aspirin EC  81 mg Oral Daily  . citalopram  20 mg Oral Daily  . cloNIDine  0.2 mg Oral BID  . clopidogrel  75 mg Oral Daily  . feeding supplement (NEPRO CARB STEADY)  237 mL Oral BID BM  . heparin injection (subcutaneous)  5,000 Units Subcutaneous Q8H  . hydrALAZINE  25 mg Oral Q8H  . insulin aspart  0-9 Units Subcutaneous TID WC  . insulin aspart  3 Units Subcutaneous TID WC  . isosorbide mononitrate  30 mg Oral Daily  . metolazone  5 mg Oral Daily  . pantoprazole  40 mg Oral Daily  . pravastatin  80 mg Oral Daily  . sodium chloride flush  3 mL Intravenous Q12H   . sodium chloride    . furosemide Stopped (08/24/17 1108)   sodium chloride, acetaminophen, famotidine, ondansetron (ZOFRAN) IV, simethicone, sodium chloride flush  Exam: Gen eldarly aaf no distress up in chair No jvd or bruits Chest clear bilat to bases no rales or wheezing RRR soft 2/6 sem no s3 Abd soft ntnd no mass or ascites +bs MS no joint effusions or deformity Ext 1+ leg edema, no wounds or ulcers Neuro is alert, Ox 3 , nf  home meds:  norvasc 10/ coreg 12.5 bid/ clonidine 0.2 bid/ hydral 25 tid/ zarox 2.5 qd/ torsemide  100 bid  statin/ ppi/ imdur/ 50-50 insulin/ pepcid/ plavix/ celexa/ asa  prns   Impression: 1 acute on ckd 4 - cardiorenal issues not diuresing have d/w dr Hollie Salk her renal md this was expected as fluid issues have been here main problem will plan on moving to [facility] and  will ask radiology to place tdc and will start hd thereafter 2 pulm edema - not good response to high-dose diuretics will need dialysis as above 3 dm2 on insulin 4 hypertension on several bp meds 5 hx cva 6 hx diast chf echo here shows normal lvef 7 hyperkalemia -on ekg there are no changes related to this, medical rx ok for now   Plan - as above   Kelly Splinter MD [facility] pager [phone]   08/24/2017, 11:48 AM   Recent Labs  Lab 08/22/17 0521 08/23/17 0549 08/24/17 0525  NA 137 137 137  K 4.5 5.8* 6.2*  CL 105 105 104  CO2 24 21* 24  GLUCOSE 44* 76 245*  BUN 53* 54* 59*  CREATININE 3.56* 3.61* 4.13*  CALCIUM 8.5* 8.4* 8.5*  PHOS 2.5  --  4.5   Recent Labs  Lab 08/20/17 0838 08/22/17 0521 08/24/17 0525  AST 19 33 48*  ALT 9* 13* 25  ALKPHOS 93 82 117  BILITOT 0.7 0.8 0.4  PROT 7.0 6.5 6.5  ALBUMIN 3.2* 2.8* 2.6*   Recent Labs  Lab 08/22/17  0355 08/23/17 0549 08/24/17 0525  WBC 8.0 6.6 5.3  NEUTROABS 4.9  --  3.4  HGB 8.2* 7.9* 7.6*  HCT 26.1* 24.9* 24.1*  MCV 87.9 88.0 89.3  PLT 171 169 172   Iron/TIBC/Ferritin/ %Sat    Component Value Date/Time   IRON 32 08/24/2017 0525   TIBC 167 (L) 08/24/2017 0525   FERRITIN 496 (H) 08/24/2017 0525   IRONPCTSAT 19 08/24/2017 0525

## 2017-08-24 NOTE — Progress Notes (Signed)
Report called to [facility] RN at [facility]. [facility] arranged for transport.

## 2017-08-24 NOTE — Progress Notes (Signed)
Inpatient Diabetes Program Recommendations  AACE/ADA: New Consensus Statement on Inpatient Glycemic Control (2015)  Target Ranges:  Prepandial:   less than 140 mg/dL      Peak postprandial:   less than 180 mg/dL (1-2 hours)      Critically ill patients:  140 - 180 mg/dL   Results for JASSLYN, FINKEL (MRN [id number]) as of 08/24/2017 10:12  Ref. Range 08/22/2017 00:15 08/22/2017 06:16 08/22/2017 06:45 08/22/2017 07:49 08/22/2017 11:55 08/22/2017 16:44 08/22/2017 20:31  Glucose-Capillary Latest Ref Range: 65 - 99 mg/dL 88 45 (L) 78    129 (H) 217 (H)  3 units NOVOLOG +  10 units LEVEMIR 173 (H)  5 units NOVOLOG  193 (H)     10 units LEVEMIR   Results for ALVERIA, MCGLAUGHLIN (MRN [id number]) as of 08/24/2017 10:12  Ref. Range 08/23/2017 01:35 08/23/2017 02:01 08/23/2017 02:23 08/23/2017 07:30 08/23/2017 09:40 08/23/2017 11:28 08/23/2017 17:21 08/23/2017 17:59 08/23/2017 20:03  Glucose-Capillary Latest Ref Range: 65 - 99 mg/dL 36 (LL) 48 (L) 71 69 139 (H)  3 units NOVOLOG +  6 units LEVEMIR  166 (H)  5 units NOVOLOG  53 (L) 81 149 (H)   Results for PETRICE, BEEDY (MRN [id number]) as of 08/24/2017 10:12  Ref. Range 08/24/2017 07:56  Glucose-Capillary Latest Ref Range: 65 - 99 mg/dL 227 (H)  6 units NOVOLOG     Home DM Meds: Humalog 50/50 Insulin- 22 units AM/ 18 units PM  Current Insulin Orders: Novolog Sensitive Correction Scale/ SSI (0-9 units) TID AC      Novolog 3 units TID with meals       MD- Note patient was having frequent episodes of Hypoglycemia.  Levemir stopped. Patient now with elevated CBG this AM: 227 mg/dl.  Question if patient was getting too much Novolog as well?  Please consider the following:  1. Restart Levemir at very low dose: Recommend Levemir 7 units daily (0.1 units/kg dosing based on weight of 74 kg).    Patient normally gets about 20 units longer-acting insulin throughout the day with her Humalog 50/50 Insulin at home.   2. Reduce Novolog Meal Coverage  to: Novolog 2 units TID with meals (hold if pt eats <50% of meal)      --Will follow patient during hospitalization--  Wyn Quaker RN, MSN, CDE Diabetes Coordinator Inpatient Glycemic Control Team Team Pager: [phone] (8a-5p)    2.

## 2017-08-24 NOTE — Progress Notes (Addendum)
Progress Note  Patient Name: Glenda [name] Date of Encounter: 08/24/2017  Primary Cardiologist: Ena Dawley, MD   Subjective   Pt is sitting up in chair. She states "I feel great". Slept well. Denies chest discomfort, shortness of breath, orthopnea, lightheadedness.   Inpatient Medications    Scheduled Meds: . amLODipine  10 mg Oral Daily  . aspirin EC  81 mg Oral Daily  . citalopram  20 mg Oral Daily  . cloNIDine  0.2 mg Oral BID  . clopidogrel  75 mg Oral Daily  . feeding supplement (NEPRO CARB STEADY)  237 mL Oral BID BM  . heparin injection (subcutaneous)  5,000 Units Subcutaneous Q8H  . hydrALAZINE  25 mg Oral Q8H  . insulin aspart  0-9 Units Subcutaneous TID WC  . insulin aspart  3 Units Subcutaneous TID WC  . isosorbide mononitrate  30 mg Oral Daily  . metolazone  5 mg Oral Daily  . pantoprazole  40 mg Oral Daily  . pravastatin  80 mg Oral Daily  . sodium chloride flush  3 mL Intravenous Q12H   Continuous Infusions: . sodium chloride    . furosemide Stopped (08/23/17 2320)   PRN Meds: sodium chloride, acetaminophen, famotidine, ondansetron (ZOFRAN) IV, simethicone, sodium chloride flush   Vital Signs    Vitals:   08/23/17 1404 08/23/17 1845 08/23/17 1848 08/24/17 0448  BP: (!) 121/45   128/70  Pulse: (!) 51   (!) 50  Resp: 18   (!) 24  Temp: (!) 97.4 F (36.3 C)   98.1 F (36.7 C)  TempSrc: Oral   Oral  SpO2: 99% (!) 89% 96% 93%  Weight:      Height:        Intake/Output Summary (Last 24 hours) at 08/24/2017 0931 Last data filed at 08/24/2017 0758 Gross per 24 hour  Intake 330.17 ml  Output 700 ml  Net -369.83 ml   Filed Weights   08/20/17 1347 08/21/17 0408 08/23/17 0500  Weight: 158 lb 15.2 oz (72.1 kg) 159 lb 6.3 oz (72.3 kg) 163 lb 5.8 oz (74.1 kg)    Telemetry    Sinus brady in the 40's overnight, sinus rhythm in the 60's this am - Personally Reviewed  ECG    Atrial fibrillation 97 bpm - Personally Reviewed  Physical Exam    GEN: No acute distress.   Neck: No JVD Cardiac: RRR, no murmurs, rubs, or gallops.  Respiratory: Clear to auscultation bilaterally, few left basilar crackles. GI: Soft, nontender, non-distended  MS: trace pretibial edema; No deformity.  Left arm AV fistula positive for bruit and thrill Neuro:  Nonfocal  Psych: Normal affect   Labs    Chemistry Recent Labs  Lab 08/20/17 0838  08/22/17 0521 08/23/17 0549 08/24/17 0525  NA 140   < > 137 137 137  K 3.9   < > 4.5 5.8* 6.2*  CL 104   < > 105 105 104  CO2 22   < > 24 21* 24  GLUCOSE 362*   < > 44* 76 245*  BUN 55*   < > 53* 54* 59*  CREATININE 3.65*   < > 3.56* 3.61* 4.13*  CALCIUM 8.7*   < > 8.5* 8.4* 8.5*  PROT 7.0  --  6.5  --  6.5  ALBUMIN 3.2*  --  2.8*  --  2.6*  AST 19  --  33  --  48*  ALT 9*  --  13*  --  25  ALKPHOS 93  --  82  --  117  BILITOT 0.7  --  0.8  --  0.4  GFRNONAA 11*   < > 11* 11* 9*  GFRAA 12*   < > 13* 13* 11*  ANIONGAP 14   < > 8 11 9    < > = values in this interval not displayed.     Hematology Recent Labs  Lab 08/22/17 0521 08/23/17 0549 08/24/17 0525  WBC 8.0 6.6 5.3  RBC 2.97*  2.97* 2.83* 2.70*  2.70*  HGB 8.2* 7.9* 7.6*  HCT 26.1* 24.9* 24.1*  MCV 87.9 88.0 89.3  MCH 27.6 27.9 28.1  MCHC 31.4 31.7 31.5  RDW 13.5 13.6 14.0  PLT 171 169 172    Cardiac Enzymes Recent Labs  Lab 08/20/17 0838 08/21/17 1807 08/22/17 0016 08/22/17 0521  TROPONINI 0.04* 1.73* 1.48* 1.56*   No results for input(s): TROPIPOC in the last 168 hours.   BNP Recent Labs  Lab 08/20/17 0838  BNP 755.4*     DDimer No results for input(s): DDIMER in the last 168 hours.   Radiology    Dg Chest 2 View  Result Date: 08/22/2017 CLINICAL DATA:  CHF with shortness of breath EXAM: CHEST - 2 VIEW COMPARISON:  08/22/2017, 08/20/2017, 03/06/2017 FINDINGS: Low lung volumes. Tiny pleural effusions. Mild cardiomegaly with vascular congestion, overall decreased. Decreased pulmonary edema with minimal  residual. Aortic atherosclerosis. No pneumothorax. IMPRESSION: 1. Low lung volumes. 2. Cardiomegaly with vascular congestion and minimal edema, overall improved since prior radiograph 3. Small pleural effusions. Electronically Signed   By: Donavan Foil M.D.   On: 08/22/2017 20:00    Cardiac Studies    Echocardiogram 08/21/17 Study Conclusions  - Left ventricle: The cavity size was normal. There was moderate concentric hypertrophy. Systolic function was normal. The estimated ejection fraction was in the range of 60% to 65%. Wall motion was normal; there were no regional wall motion abnormalities. Features are consistent with a pseudonormal left ventricular filling pattern, with concomitant abnormal relaxation and increased filling pressure (grade 2 diastolic dysfunction). Doppler parameters are consistent with elevated ventricular end-diastolic filling pressure. - Mitral valve: Calcified annulus. Mildly thickened leaflets . There was moderate regurgitation. - Left atrium: The atrium was moderately dilated. - Right ventricle: The cavity size was mildly dilated. Wall thickness was normal. Systolic function was normal. - Tricuspid valve: There was mild regurgitation. - Pulmonary arteries: Systolic pressure was moderately increased. PA peak pressure: 47 mm Hg (S). - Inferior vena cava: The vessel was dilated. The respirophasic diameter changes were blunted (<50%), consistent with elevated central venous pressure. - Pericardium, extracardiac: There was no pericardial effusion.   Echo 12/2016: Severe LVH, EF 65-70%, grade 3 DD, PA peak pressure 51 mmHg, mild-mod MR.   Myocardial perfusion imaging9/25/2015: Normal test, no ischemia, normal LVEF  Left cardiac cath 2012 ANGIOGRAPHIC DATA:  1. Ventriculography was performed in the RAO projection. Overall systolic  function was well preserved and no segmental abnormalities or contraction  were identified.  There was near cavity obliteration. No significant  mitral regurgitation was noted.  2. There was no obvious calcification of the coronary vessels.  3. The left main coronary artery was fairly large and free of critical  disease.  4. The LAD coursed to the apex, wrapped the apex, and supplied a significant  portion of the distal inferior wall. There was a tiny first diagonal  that was free of critical disease. The proximal and mid LAD appeared to  be  without significant focal narrowing. The LAD after the major diagonal  which was the second diagonal demonstrates a segmental plaque of about 50-  60%. This was previously graded at 30, but appeared to be only mildly  progressed from the previous study. There is also a diagonal stenosis  that involves likely the bifurcation and is about 50-60% as well. The  diagonal is moderate in size.  5. The circumflex provides a tiny first marginal branch that has about a 50%  area of narrowing but it is very small. There is a second large marginal  branch which bifurcates twice in its distal course. It is a large  caliber vessel and without critical disease. The AV circumflex has a  segmental area of about 60-70% right after the takeoff of a large major  diagonal branch and that is graded a 60-70%. The distal termination of  this is a smaller posterolateral branch which thus supplies a fairly  small area of myocardium.  6. The right coronary artery is a small caliber vessel being 2 mm or less in  size. There is about a 30-40% area of plaquing in the first bend between  the proximal and mid vessel. There is probably 40% mid narrowing and in  the distal vessel some mild luminal irregularity. The terminal branches  which consist of a posterior descending and several posterolateral  branches are relatively small in caliber.  CONCLUSIONS:  1. Hypodynamic left ventricular function.  2. Scattered disease of the left anterior descending and diagonal as noted   on previous study.  3. Moderate obstruction of the AV circumflex after the large marginal  takeoff.  4. Scattered irregularities of the right coronary artery as described above.   DISPOSITION: The initial plan would be to treat the patient medically. It  is unclear which, if any, of these lesions might be causing significant  angina. We plan to check a D-dimer. We also plan to give the patient  medical therapy. She will be followed by Jeanann Lewandowsky, M.D. and either  Junious Silk, M.D. [facility] or Loretha Brasil. [name], M.D.    Patient Profile     84 y.o. female with a hx of CAD (moderate by cath 7858), Diastolic heart failure, CKD stage V, DM, HLD, HTN, GERD, breast cancer, OSA and stroke 2016who is being seen today for the evaluation of elevated troponin and diastolic heart failure  Assessment & Plan    Demand ischemia -Troponins elevated with peak 1.73 and trended down in the setting of severe LVH, known moderated CAD and CKD. -She continues to be chest pain-free and breathing has improved -For now avoiding cardiac cath due to worsening renal function.  She is having no ischemic symptoms.  She is now likely to be starting dialysis in the next day or 2 and considering her increased CV risk could consider invasive procedure once she is on dialysis -Continuing on aspirin and Plavix  Acute on chronic diastolic CHF -Echo showed Moderated LVH with normal LV systolic function, EF 85-02%, grade 2 DD, mod MR, Moderately increased pulmonary artery pressure, 47 mmHg -On medical therapy including. Hydralazine and  Imdur. -Breathing is improved, no orthopnea.  Does have mild lower extremity edema and few crackles in the left base.  Weight is up 4 pounds from yesterday. -We have been attempting to diurese the patient however she has had poor response to Lasix with worsening renal function.  Diuretic dosing has been escalated to maximum therapy with Lasix 120 mg 3 times daily  and metolazone 5 mg  daily with only small increase in urinary output.  She did have 0.9 L output since yesterday which is the most that she has produced. -She is being followed by nephrology starting the process to get her on hemodialysis possibly later today or tomorrow. -So far she is stable without acute pulmonary edema.  We will continue with current therapy and monitoring  Chronic kidney disease stage IV-V -Recent AV fistula placed, not ready to use -Renal function has been progressively worse with diuretics and decreased urine output -Today she has hyperkalemia -She is being followed by nephrology who plans to probably initiate hemodialysis.  Dr. Jonnie Finner plans to make a decision this afternoon or in the morning  Hyperkalemia -Likely related to worsening renal function. -Kayexelate 30g given this morning -Patient has been bradycardic overnight with rates in the 40s, asymptomatic.  Will check EKG for signs of hyperkalemic changes. ---EKG does not show any hyperkalemic changes but does show afib.   Paroxysmal atrial fibrillation -Patient had brief episode of PAF 2 nights ago with no recurrences on tele.  -Noted that if the A. fib returns we will consider anticoagulation with warfarin. -has maintained SR/SB but EKG done today shows atrial fib with rate controlled at 97 bpm. Later notified by nurse of rates up to 120's then back to 90's. Unable to add BB due to baseline bradycardia. Will defer anticoagulation until decision on dialysis as she would need a temporary HD access placed.  -Once warfarin initiated, can discontinue DAPT due to increased bleeding risk with triple therapy.  Diabetes mellitus -Managed by primary team. Patient has had some hypoglycemia.  Lantus has been discontinued.  She continues with sliding scale insulin as needed.  Hypertension -Blood pressure has been well controlled.  Continue current therapy.   For questions or updates, please contact [place] Please consult  [email or web address] for contact info under Cardiology/STEMI.      Signed, Daune Perch, NP  08/24/2017, 9:31 AM    Personally seen and examined. Agree with above.  No significant UOP. Hyperkalemia. No SOB, no CP AFIB has returned. Off coreg because of prior bradycardia in NSR.   GEN: Well nourished, well developed, in no acute distress  HEENT: normal  Neck: no JVD, carotid bruits, or masses Cardiac: irreg; no murmurs, rubs, or gallops,no edema  Respiratory:  clear to auscultation bilaterally, normal work of breathing GI: soft, nontender, nondistended, + BS MS: no deformity or atrophy  Skin: warm and dry, no rash Neuro:  Alert and Oriented x 3, Strength and sensation are intact Psych: euthymic mood, full affect  A/P  1 - ARF/CKD5/hyperkalemia - Kaex, will likely need dial cath placed.  2 - PAF - needs warfarin when able (waiting for dial cath). HR 90's/ Stable. Can restart coreg if needed.  3 - DM with HTN - stable 4 - Prior stroke - on ASA and Plavix. OK with warfarin monotherapy when able.  5 - Acute diastolic HF- no change with metolazone. Creat 4  Candee Furbish, MD

## 2017-08-24 NOTE — Progress Notes (Signed)
PROGRESS NOTE    Glenda [name]   QVZ:[id number]  DOB: 1934/03/23  DOA: 08/20/2017 PCP: Angelica Pou, MD   Brief Narrative:  Glenda [name] is an 84 year old female with hypertension, diabetes mellitus 2, coronary artery disease, breast cancer, CKD 4, Chronic diastolic CHF, obstructive sleep apnea who presents to [facility] hospital for shortness of breath.  Chest x-ray revealed pulmonary edema and cardiomegaly.  She was noted to be hypoxic with pulse ox in the 70s.  She was diuresed aggressively and nephrology and cardiology were consulted.  Due to inadequate urine output nephrology has recommended transfer to [facility] today for initiation of dialysis. Hospital course was complicated by an episode of atrial fibrillation, peak troponin of 1.73  Subjective: She has no complaints today. Her daughter states she urinated a lot last night.    Assessment & Plan:   Principal Problem:   Acute renal failure superimposed on chronic kidney disease   Acute on chronic grade 2 diastolic CHF   Acute respiratory failure with hypoxia -Creatinine continues to rise despite high-dose diuretics-potassium is rising as well -Dr. Melvia Heaps, nephrology, has spoken with me today and has requested transfer to [facility] - she has a fistula (3/21) which is not ready to use and will need a dialysis cath  Active Problems: Hyperkalemia -Potassium was 6.2 today-as potassium will likely continue to rise until she is able to be dialyzed, I have ordered 30 g of Kayexalate repeat potassium later today  Elevated troponin  -Prior left heart cath in 2012 showed moderate coronary artery disease -Cardiology consult appreciated-likely demand ischemia- cardiology will consider proceeding with cardiac cath she is on dialysis -She is currently on aspirin Plavix - ECHO without WMA  PAF (paroxysmal atrial fibrillation)  - one short episode during this hospital - waiting to see if she has further episodes and  holding off on anticoagulation for now - see ECHO below  AOCD - anemia panel reviewed - normal Iron, folate and B12 stores although B12 is low normal- will give a dose of s/c B12 1000 mg today    Essential hypertension -continue Imdur, Hydralazine, Amlodipine, clonidine   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 There was moderate concentric hypertrophy. Systolic function was normal. The estimated ejection fraction was in the range of 60% to 65%. Wall motion was normal; there were no regional wall motion abnormalities. Features are consistent with a pseudonormal left ventricular filling pattern, with concomitant abnormal relaxation and increased filling pressure (grade 2 diastolic dysfunction). Doppler parameters are consistent with elevated ventricular end-diastolic filling pressure. - Mitral valve: Calcified annulus. Mildly thickened leaflets . There was moderate regurgitation. - Left atrium: The atrium was moderately dilated. - Right ventricle: The cavity size was mildly dilated. Wall thickness was normal. Systolic function was normal. - Tricuspid valve: There was mild regurgitation. - Pulmonary arteries: Systolic pressure was moderately increased. PA peak pressure: 47 mm Hg (S). - Inferior vena cava: The vessel was dilated.  The respirophasic diameter changes were blunted (<50%), consistent with elevated central  venous pressure. - Pericardium, extracardiac: There was no pericardial effusion.    Antimicrobials:  Anti-infectives (From admission, onward)   None       Objective: Vitals:   08/23/17 1845 08/23/17 1848 08/24/17 0448 08/24/17 1302  BP:   128/70 (!) 157/60  Pulse:   (!) 50 80  Resp:   (!) 24 16  Temp:   98.1 F (36.7 C) 98.3 F (36.8 C)  TempSrc:   Oral Oral  SpO2: (!) 89% 96% 93% 98%  Weight:    75.4 kg (166 lb 3.2 oz)  Height:        Intake/Output Summary (Last 24 hours) at 08/24/2017 1536 Last data filed at 08/24/2017 1435 Gross per 24 hour  Intake 120 ml  Output 500 ml  Net -380 ml   Filed Weights   08/21/17 0408 08/23/17 0500 08/24/17 1302  Weight: 72.3 kg (159 lb 6.3 oz) 74.1 kg (163 lb 5.8 oz) 75.4 kg (166 lb 3.2 oz)    Examination: General exam: Appears comfortable  HEENT: PERRLA, oral mucosa moist, no sclera icterus or thrush Respiratory system: crackles in b/l lower lobes  Cardiovascular system: S1 & S2 heard, RRR.   Gastrointestinal system: Abdomen soft, non-tender, nondistended. Normal bowel sound. No organomegaly Central nervous system: Alert and oriented. No focal neurological deficits. Extremities: No cyanosis, clubbing - 2 + pitting edema Skin: No rashes or ulcers Psychiatry:  Mood & affect appropriate.     Data Reviewed: I have personally reviewed following labs and imaging studies  CBC: Recent Labs  Lab 08/20/17 1509 08/22/17 0521 08/23/17 0549 08/24/17 0525  WBC 7.0 8.0 6.6 5.3  NEUTROABS  --  4.9  --  3.4  HGB 8.7* 8.2* 7.9* 7.6*  HCT 27.6* 26.1* 24.9* 24.1*  MCV 88.2 87.9 88.0 89.3  PLT 168 171 169 425   Basic Metabolic Panel: Recent Labs  Lab 08/20/17 0838 08/20/17 1509 08/21/17 0548 08/22/17 0521 08/23/17 0549 08/24/17 0525  NA 140  --  142 137 137 137  K 3.9  --  3.9 4.5 5.8* 6.2*  CL 104  --  107 105 105 104  CO2 22  --  26 24 21* 24  GLUCOSE 362*  --  67 44* 76 245*  BUN 55*  --  54* 53* 54* 59*  CREATININE 3.65*  3.43* 3.58* 3.56* 3.61* 4.13*  CALCIUM 8.7*  --  8.7* 8.5* 8.4* 8.5*  MG  --   --   --  1.5*  --  2.0  PHOS  --   --   --  2.5  --  4.5   GFR: Estimated Creatinine Clearance: 11.1 mL/min (A) (by C-G formula based on SCr of 4.13 mg/dL (H)). Liver Function Tests: Recent Labs  Lab 08/20/17 0838 08/22/17 0521 08/24/17 0525  AST 19 33 48*  ALT 9* 13* 25  ALKPHOS 93 82 117  BILITOT 0.7 0.8 0.4  PROT 7.0 6.5 6.5  ALBUMIN 3.2* 2.8* 2.6*   No results for input(s): LIPASE, AMYLASE in the last 168 hours. No results for input(s): AMMONIA in the last 168 hours. Coagulation Profile: Recent Labs  Lab 08/21/17 1927  INR 1.00   Cardiac Enzymes: Recent Labs  Lab 08/20/17 0838 08/21/17 1807 08/22/17 0016 08/22/17 0521  TROPONINI 0.04* 1.73* 1.48* 1.56*   BNP (last 3 results) No results for input(s): PROBNP in the last 8760 hours. HbA1C: No results for input(s): HGBA1C in the  last 72 hours. CBG: Recent Labs  Lab 08/23/17 1721 08/23/17 1759 08/23/17 2003 08/24/17 0756 08/24/17 1120  GLUCAP 53* 81 149* 227* 219*   Lipid Profile: No results for input(s): CHOL, HDL, LDLCALC, TRIG, CHOLHDL, LDLDIRECT in the last 72 hours. Thyroid Function Tests: No results for input(s): TSH, T4TOTAL, FREET4, T3FREE, THYROIDAB in the last 72 hours. Anemia Panel: Recent Labs    08/22/17 0521 08/24/17 0525  VITAMINB12 206 261  FOLATE 14.6 13.2  FERRITIN 672* 496*  TIBC 175* 167*  IRON 13* 32  RETICCTPCT 2.8 3.9*   Urine analysis:    Component Value Date/Time   COLORURINE YELLOW 08/23/2017 0824   APPEARANCEUR CLOUDY (A) 08/23/2017 0824   LABSPEC 1.009 08/23/2017 0824   PHURINE 5.0 08/23/2017 0824   GLUCOSEU NEGATIVE 08/23/2017 0824   HGBUR NEGATIVE 08/23/2017 0824   BILIRUBINUR NEGATIVE 08/23/2017 0824   KETONESUR NEGATIVE 08/23/2017 0824   PROTEINUR NEGATIVE 08/23/2017 0824   UROBILINOGEN 0.2 07/04/2014 1900   NITRITE NEGATIVE 08/23/2017 0824   LEUKOCYTESUR LARGE (A) 08/23/2017  0824   Sepsis Labs: @LABRCNTIP (procalcitonin:4,lacticidven:4) )No results found for this or any previous visit (from the past 240 hour(s)).       Radiology Studies: Dg Chest 2 View  Result Date: 08/22/2017 CLINICAL DATA:  CHF with shortness of breath EXAM: CHEST - 2 VIEW COMPARISON:  08/22/2017, 08/20/2017, 03/06/2017 FINDINGS: Low lung volumes. Tiny pleural effusions. Mild cardiomegaly with vascular congestion, overall decreased. Decreased pulmonary edema with minimal residual. Aortic atherosclerosis. No pneumothorax. IMPRESSION: 1. Low lung volumes. 2. Cardiomegaly with vascular congestion and minimal edema, overall improved since prior radiograph 3. Small pleural effusions. Electronically Signed   By: Donavan Foil M.D.   On: 08/22/2017 20:00   Dg Chest Port 1 View  Result Date: 08/24/2017 CLINICAL DATA:  Shortness of breath EXAM: PORTABLE CHEST 1 VIEW COMPARISON:  08/22/2017 FINDINGS: Cardiomegaly. Diffuse interstitial prominence. Perihilar and lower lobe opacities, left greater than right. Findings have worsened since prior study, likely related to edema. No visible significant effusions or acute bony abnormality. IMPRESSION: Increasing interstitial prominence and perihilar and lower lobe opacities, left greater than right, most likely edema/CHF. Electronically Signed   By: Rolm Baptise M.D.   On: 08/24/2017 10:03      Scheduled Meds: . amLODipine  10 mg Oral Daily  . aspirin EC  81 mg Oral Daily  . citalopram  20 mg Oral Daily  . cloNIDine  0.2 mg Oral BID  . clopidogrel  75 mg Oral Daily  . feeding supplement (NEPRO CARB STEADY)  237 mL Oral BID BM  . heparin injection (subcutaneous)  5,000 Units Subcutaneous Q8H  . hydrALAZINE  25 mg Oral Q8H  . insulin aspart  0-9 Units Subcutaneous TID WC  . insulin aspart  3 Units Subcutaneous TID WC  . isosorbide mononitrate  30 mg Oral Daily  . metolazone  5 mg Oral Daily  . pantoprazole  40 mg Oral Daily  . pravastatin  80 mg Oral  Daily  . sodium chloride flush  3 mL Intravenous Q12H   Continuous Infusions: . sodium chloride    . furosemide Stopped (08/24/17 1108)     LOS: 4 days    Time spent in minutes: 35    Debbe Odea, MD [facility] Pager: [email or web address] Password [facility] 08/24/2017, 3:36 PM

## 2017-08-25 ENCOUNTER — Inpatient Hospital Stay (HOSPITAL_COMMUNITY): Payer: Medicare (Managed Care)

## 2017-08-25 ENCOUNTER — Encounter (HOSPITAL_COMMUNITY): Payer: Self-pay | Admitting: Interventional Radiology

## 2017-08-25 HISTORY — PX: IR US GUIDE VASC ACCESS RIGHT: IMG2390

## 2017-08-25 HISTORY — PX: IR FLUORO GUIDE CV LINE RIGHT: IMG2283

## 2017-08-25 LAB — BASIC METABOLIC PANEL
ANION GAP: 9 (ref 5–15)
BUN: 53 mg/dL — ABNORMAL HIGH (ref 6–20)
CALCIUM: 8.3 mg/dL — AB (ref 8.9–10.3)
CO2: 28 mmol/L (ref 22–32)
Chloride: 101 mmol/L (ref 101–111)
Creatinine, Ser: 3.69 mg/dL — ABNORMAL HIGH (ref 0.44–1.00)
GFR calc Af Amer: 12 mL/min — ABNORMAL LOW (ref >60)
GFR, EST NON AFRICAN AMERICAN: 11 mL/min — AB (ref >60)
GLUCOSE: 204 mg/dL — AB (ref 65–99)
POTASSIUM: 4.3 mmol/L (ref 3.5–5.1)
SODIUM: 138 mmol/L (ref 135–145)

## 2017-08-25 LAB — GLUCOSE, CAPILLARY
GLUCOSE-CAPILLARY: 197 mg/dL — AB (ref 65–99)
GLUCOSE-CAPILLARY: 205 mg/dL — AB (ref 65–99)
Glucose-Capillary: 203 mg/dL — ABNORMAL HIGH (ref 65–99)
Glucose-Capillary: 197 mg/dL — ABNORMAL HIGH (ref 65–99)
Glucose-Capillary: 349 mg/dL — ABNORMAL HIGH (ref 65–99)
Glucose-Capillary: 248 mg/dL — ABNORMAL HIGH (ref 65–99)

## 2017-08-25 MED ORDER — SODIUM CHLORIDE 0.9 % IV SOLN: 100.0000 mL | INTRAVENOUS | Status: DC | PRN

## 2017-08-25 MED ORDER — LIDOCAINE-PRILOCAINE 2.5-2.5 % EX CREA: 1.0000 "application " | TOPICAL_CREAM | CUTANEOUS | Status: DC | PRN

## 2017-08-25 MED ORDER — PENTAFLUOROPROP-TETRAFLUOROETH EX AERO: 1.0000 "application " | INHALATION_SPRAY | CUTANEOUS | Status: DC | PRN

## 2017-08-25 MED ORDER — HEPARIN SODIUM (PORCINE) 1000 UNIT/ML IJ SOLN
INTRAMUSCULAR | Status: AC
  Administered 2017-08-25: 3500 [IU] via INTRAVENOUS_CENTRAL
  Filled 2017-08-25: qty 1

## 2017-08-25 MED ORDER — LIDOCAINE HCL (PF) 1 % IJ SOLN
INTRAMUSCULAR | Status: DC | PRN
  Administered 2017-08-25: 5 mL

## 2017-08-25 MED ORDER — HEPARIN SODIUM (PORCINE) 5000 UNIT/ML IJ SOLN: 5000.0000 [IU] | Freq: Three times a day (TID) | INTRAMUSCULAR | Status: DC

## 2017-08-25 MED ORDER — HEPARIN SODIUM (PORCINE) 1000 UNIT/ML DIALYSIS
3500.0000 [IU] | INTRAMUSCULAR | Status: DC | PRN
  Administered 2017-08-25: 3500 [IU] via INTRAVENOUS_CENTRAL

## 2017-08-25 MED ORDER — HEPARIN SODIUM (PORCINE) 5000 UNIT/ML IJ SOLN
5000.0000 [IU] | Freq: Three times a day (TID) | INTRAMUSCULAR | Status: AC
  Administered 2017-08-25 – 2017-08-28 (×10): 5000 [IU] via SUBCUTANEOUS
  Filled 2017-08-25 (×9): qty 1

## 2017-08-25 MED ORDER — LIDOCAINE HCL 1 % IJ SOLN
INTRAMUSCULAR | Status: AC
  Filled 2017-08-25: qty 20

## 2017-08-25 NOTE — Progress Notes (Signed)
Physical Therapy Treatment Patient Details Name: Glenda [name] MRN: [id number] DOB: 1933/12/19 Today's Date: 08/25/2017    History of Present Illness 84 y.o. female with medical history significant of grade 3 diastolic HF presenting with 1 day complaint of worsening sob. PMH of CVA with R HP, DM, HTN.    PT Comments    Patient is progressing very well towards their physical therapy goals. Increase in ambulation distance this session to 100 feet total (50 + 50 with seated rest break in between). SpO2 remained 96% on 2L O2 with ambulation. Patient continues with gait deviations due to residual weakness from previous stroke that become more pronounced with fatigue. Continue to progress gait training, strengthening and balance.     Follow Up Recommendations  No PT follow up     Equipment Recommendations  None recommended by PT(plans to exercise 3x/week at exercise program; declines HHPT)    Recommendations for Other Services       Precautions / Restrictions Precautions Precautions: Fall Precaution Comments: pt denies falls in past 1 year,  has AFO at home but hasn't needed it recently Restrictions Weight Bearing Restrictions: No    Mobility  Bed Mobility               General bed mobility comments: OOB in recliner  Transfers Overall transfer level: Needs assistance Equipment used: Rolling walker (2 wheeled) Transfers: Sit to/from Stand Sit to Stand: Min assist         General transfer comment: Min assist to power up from recliner to RW.   Ambulation/Gait Ambulation/Gait assistance: Min guard Ambulation Distance (Feet): 100 Feet Assistive device: Rolling walker (2 wheeled) Gait Pattern/deviations: Step-through pattern;Decreased stride length;Decreased dorsiflexion - right;Steppage Gait velocity: decr   General Gait Details: Patient ambulated 100 feet total (50x50) with seated rest break. VC's for decreased right foot circumduction and heel strike at initial  contact. Becomes more pronounced with fatigue.    Stairs             Wheelchair Mobility    Modified Rankin (Stroke Patients Only)       Balance Overall balance assessment: Needs assistance Sitting-balance support: No upper extremity supported;Feet supported Sitting balance-Leahy Scale: Good     Standing balance support: Bilateral upper extremity supported Standing balance-Leahy Scale: Fair                              Cognition Arousal/Alertness: Awake/alert Behavior During Therapy: WFL for tasks assessed/performed Overall Cognitive Status: Within Functional Limits for tasks assessed                                        Exercises      General Comments        Pertinent Vitals/Pain Pain Assessment: No/denies pain    Home Living                      Prior Function            PT Goals (current goals can now be found in the care plan section) Acute Rehab PT Goals Patient Stated Goal: likes to go to church and [facility] PT Goal Formulation: With family Time For Goal Achievement: 09/06/17 Potential to Achieve Goals: Good Progress towards PT goals: Progressing toward goals    Frequency    Min  3X/week      PT Plan Current plan remains appropriate    Co-evaluation              AM-PAC PT "6 Clicks" Daily Activity  Outcome Measure  Difficulty turning over in bed (including adjusting bedclothes, sheets and blankets)?: A Little Difficulty moving from lying on back to sitting on the side of the bed? : A Little Difficulty sitting down on and standing up from a chair with arms (e.g., wheelchair, bedside commode, etc,.)?: A Little Help needed moving to and from a bed to chair (including a wheelchair)?: A Little Help needed walking in hospital room?: A Little Help needed climbing 3-5 steps with a railing? : A Lot 6 Click Score: 17    End of Session Equipment Utilized During Treatment: Gait  belt;Oxygen Activity Tolerance: Patient tolerated treatment well Patient left: in chair;with call bell/phone within reach;with nursing/sitter in room Nurse Communication: Mobility status PT Visit Diagnosis: Difficulty in walking, not elsewhere classified (R26.2)     Time: 1601-0932 PT Time Calculation (min) (ACUTE ONLY): 25 min  Charges:  $Gait Training: 23-37 mins                    G Codes:       Ellamae Sia, PT, DPT Acute Rehabilitation Services  Pager: [place] 08/25/2017, 4:16 PM

## 2017-08-25 NOTE — Consult Note (Signed)
Chief Complaint: Patient was seen in consultation today for temporary dialysis catheter placement at the request of Dr Mickel Crow   Supervising Physician: Corrie Mckusick  Patient Status: [facility] - In-pt  History of Present Illness: Glenda [name] is a 84 y.o. female   Hx CVA; HTN; CAD; Breast Ca; OSA CKD 4 Worsening renal function Left arm Fistula placed 3/21--- not yet mature Need catheter placement for now to initiate dialysis  On Plavix daily-- (had resumed 4/26) LD 5/1 Discussed with Dr Earleen Newport Luvenia Heller tio be off 5 days to minimize bleeding risk if deem safe per MD  Discussed with Dr Jonnie Finner--- ok to hold Plavix discontinued---plan for tunneled catheter 5/6   Past Medical History:  Diagnosis Date  . Arthritis   . Breast cancer ([place])   . CAD (coronary artery disease)   . Chest pain   . CHF (congestive heart failure) ([place])   . Chronic kidney disease    stage 4  . Dizziness   . DM (diabetes mellitus) ([place])   . GERD (gastroesophageal reflux disease)   . HLD (hyperlipidemia)   . HTN (hypertension)   . Obesity   . Radiation 09/11/15-10/13/15   42.72 Gy to right breast, 12 Gy boost to right breast  . Shortness of breath dyspnea   . Sleep apnea    no longer uses cpap   . Stroke [facility])    balance issues - walks with walker and uses wheelchair   . UTI (urinary tract infection)     Past Surgical History:  Procedure Laterality Date  . AV FISTULA PLACEMENT Left 07/14/2017   Procedure: Left arm Brachicephalic Fistula Creation;  Surgeon: Serafina Mitchell, MD;  Location: [place];  Service: Vascular;  Laterality: Left;  . BALLOON DILATION N/A 08/18/2017   Procedure: BALLOON DILATION;  Surgeon: Clarene Essex, MD;  Location: [place];  Service: Endoscopy;  Laterality: N/A;  . CHOLECYSTECTOMY    . colon polyps; hx    . CORONARY ANGIOPLASTY  02/14/09  . ESOPHAGOGASTRODUODENOSCOPY (EGD) WITH PROPOFOL Left 09/08/2015   Procedure: ESOPHAGOGASTRODUODENOSCOPY (EGD) WITH PROPOFOL;   Surgeon: Clarene Essex, MD;  Location: [facility] ENDOSCOPY;  Service: Endoscopy;  Laterality: Left;  . ESOPHAGOGASTRODUODENOSCOPY (EGD) WITH PROPOFOL N/A 08/18/2017   Procedure: ESOPHAGOGASTRODUODENOSCOPY (EGD) WITH PROPOFOL;  Surgeon: Clarene Essex, MD;  Location: [place];  Service: Endoscopy;  Laterality: N/A;  with flouroscopy  . RADIOACTIVE SEED GUIDED PARTIAL MASTECTOMY WITH AXILLARY SENTINEL LYMPH NODE BIOPSY Right 07/17/2015   Procedure: RADIOACTIVE SEED GUIDED PARTIAL MASTECTOMY WITH AXILLARY SENTINEL LYMPH NODE BIOPSY;  Surgeon: Erroll Luna, MD;  Location: [place];  Service: General;  Laterality: Right;    Allergies: Patient has no known allergies.  Medications: Prior to Admission medications   Medication Sig Start Date End Date Taking? Authorizing Provider  acetaminophen (TYLENOL) 500 MG tablet Take 1,000 mg by mouth daily as needed for moderate pain or headache.   Yes [provider]  amLODipine (NORVASC) 10 MG tablet Take 10 mg by mouth daily.    Yes [provider]  aspirin EC 81 MG tablet Take 81 mg by mouth daily.   Yes [provider]  carvedilol (COREG) 12.5 MG tablet Take 1 tablet (12.5 mg total) by mouth 2 (two) times daily with a meal. 01/06/17  Yes Regalado, Belkys A, MD  cetirizine (ZYRTEC) 10 MG tablet Take 10 mg daily by mouth.   Yes [provider]  citalopram (CELEXA) 20 MG tablet Take 20 mg by mouth daily.   Yes [provider]  cloNIDine (CATAPRES) 0.2 MG tablet Take 0.2 mg by mouth 2 (two) times daily.    Yes [provider]  clopidogrel (PLAVIX) 75 MG tablet Take 75 mg by mouth daily.    Yes [provider]  famotidine (PEPCID AC) 10 MG chewable tablet Chew 10 mg by mouth daily as needed for heartburn.   Yes [provider]  HUMALOG MIX 50/50 KWIKPEN (50-50) 100 UNIT/ML Kwikpen Inject 18-22 Units into the skin 2 (two) times daily. Inject 22 units in the morning and 18 units at bedtime 01/06/17  Yes  [provider]  hydrALAZINE (APRESOLINE) 25 MG tablet Take 1 tablet (25 mg total) every 8 (eight) hours by mouth. 03/06/17  Yes Thurnell Lose, MD  isosorbide mononitrate (IMDUR) 30 MG 24 hr tablet Take 1 tablet (30 mg total) daily by mouth. 03/06/17 08/20/17 Yes Thurnell Lose, MD  metolazone (ZAROXOLYN) 2.5 MG tablet Take 2.5 mg by mouth daily.   Yes [provider]  Nutritional Supplements (FEEDING SUPPLEMENT, NEPRO CARB STEADY,) LIQD Take 237 mLs by mouth 2 (two) times daily between meals. 01/06/17  Yes Regalado, Belkys A, MD  pantoprazole (PROTONIX) 40 MG tablet Take 40 mg by mouth daily.   Yes [provider]  potassium chloride SA (K-DUR,KLOR-CON) 20 MEQ tablet Take 2 tablets (40 mEq total) daily by mouth. 03/07/17  Yes Thurnell Lose, MD  pravastatin (PRAVACHOL) 80 MG tablet Take 80 mg by mouth daily.   Yes [provider]  simethicone (GAS-X) 80 MG chewable tablet Chew 80 mg by mouth every 6 (six) hours as needed for flatulence.   Yes [provider]  torsemide (DEMADEX) 100 MG tablet Take 1 tablet (100 mg total) 2 (two) times daily by mouth. 03/06/17  Yes Thurnell Lose, MD  oxyCODONE-acetaminophen (PERCOCET) 5-325 MG tablet Take 1 tablet by mouth every 8 (eight) hours as needed for severe pain. Patient not taking: Reported on 08/17/2017 07/14/17   Gabriel Earing, PA-C     Family History  Problem Relation Age of Onset  . Diabetes Mother   . Colon cancer Father        also had lung  . Diabetes Unknown   . Heart attack Unknown   . Diabetes Sister        [name]  . Breast cancer Sister     Social History   Socioeconomic History  . Marital status: Widowed    Spouse name: Not on file  . Number of children: 5  . Years of education: 27  . Highest education level: Not on file  Occupational History  . Occupation: Retired  Scientific laboratory technician  . Financial resource strain: Not on file  . Food insecurity:    Worry: Not on file     Inability: Not on file  . Transportation needs:    Medical: Not on file    Non-medical: Not on file  Tobacco Use  . Smoking status: Never Smoker  . Smokeless tobacco: Never Used  Substance and Sexual Activity  . Alcohol use: No    Alcohol/week: 0.0 oz  . Drug use: No  . Sexual activity: Not Currently  Lifestyle  . Physical activity:    Days per week: Not on file    Minutes per session: Not on file  . Stress: Not on file  Relationships  . Social connections:    Talks on phone: Not on file    Gets together: Not on file    Attends  religious service: Not on file    Active member of club or organization: Not on file    Attends meetings of clubs or organizations: Not on file    Relationship status: Not on file  Other Topics Concern  . Not on file  Social History Narrative   Retired.    Lives with daughter   Caffeine use: Drinks soda and coffee and tea daily   Review of Systems: A 12 point ROS discussed and pertinent positives are indicated in the HPI above.  All other systems are negative.  Review of Systems  Constitutional: Positive for fatigue. Negative for activity change and fever.  Respiratory: Negative for cough and shortness of breath.   Cardiovascular: Negative for chest pain.  Gastrointestinal: Negative for abdominal pain.  Neurological: Positive for weakness.  Psychiatric/Behavioral: Negative for behavioral problems and confusion.    Vital Signs: BP (!) 153/64 (BP Location: Right Arm)   Pulse 78   Temp 98 F (36.7 C)   Resp 16   Ht 5\' 6"  (1.676 m)   Wt 166 lb 3.6 oz (75.4 kg)   SpO2 100%   BMI 26.83 kg/m   Physical Exam  Cardiovascular: Normal rate and regular rhythm.  Pulmonary/Chest: Effort normal and breath sounds normal.  Abdominal: Soft. Bowel sounds are normal.  Musculoskeletal: Normal range of motion.  Neurological: She is alert.  Skin: Skin is warm and dry.  Psychiatric: She has a normal mood and affect. Her behavior is normal.  Consented  with Dtr at bedside  Nursing note and vitals reviewed.   Imaging: Dg Chest 2 View  Result Date: 08/22/2017 CLINICAL DATA:  CHF with shortness of breath EXAM: CHEST - 2 VIEW COMPARISON:  08/22/2017, 08/20/2017, 03/06/2017 FINDINGS: Low lung volumes. Tiny pleural effusions. Mild cardiomegaly with vascular congestion, overall decreased. Decreased pulmonary edema with minimal residual. Aortic atherosclerosis. No pneumothorax. IMPRESSION: 1. Low lung volumes. 2. Cardiomegaly with vascular congestion and minimal edema, overall improved since prior radiograph 3. Small pleural effusions. Electronically Signed   By: Donavan Foil M.D.   On: 08/22/2017 20:00   Dg Chest Port 1 View  Result Date: 08/24/2017 CLINICAL DATA:  Shortness of breath EXAM: PORTABLE CHEST 1 VIEW COMPARISON:  08/22/2017 FINDINGS: Cardiomegaly. Diffuse interstitial prominence. Perihilar and lower lobe opacities, left greater than right. Findings have worsened since prior study, likely related to edema. No visible significant effusions or acute bony abnormality. IMPRESSION: Increasing interstitial prominence and perihilar and lower lobe opacities, left greater than right, most likely edema/CHF. Electronically Signed   By: Rolm Baptise M.D.   On: 08/24/2017 10:03   Dg Chest Port 1 View  Result Date: 08/22/2017 CLINICAL DATA:  Shortness of breath. EXAM: PORTABLE CHEST 1 VIEW COMPARISON:  Radiograph of August 20, 2017. FINDINGS: Stable cardiomegaly with bilateral pulmonary edema. Atherosclerosis of thoracic aorta is noted. No pneumothorax or pleural effusion is noted. Bony thorax is unremarkable. IMPRESSION: Stable cardiomegaly with bilateral pulmonary edema. Aortic Atherosclerosis (ICD10-I70.0). Electronically Signed   By: Marijo Conception, M.D.   On: 08/22/2017 07:22   Dg Chest Port 1 View  Result Date: 08/20/2017 CLINICAL DATA:  Shortness of breath since yesterday.  Arm pain. EXAM: PORTABLE CHEST 1 VIEW COMPARISON:  March 06, 2017  FINDINGS: Stable mild cardiomegaly. No pneumothorax. Moderate pulmonary edema. No other changes. IMPRESSION: Cardiomegaly and moderate pulmonary edema. Electronically Signed   By: Dorise Bullion III M.D   On: 08/20/2017 09:08    Labs:  CBC: Recent Labs    08/20/17  1509 08/22/17 0521 08/23/17 0549 08/24/17 0525  WBC 7.0 8.0 6.6 5.3  HGB 8.7* 8.2* 7.9* 7.6*  HCT 27.6* 26.1* 24.9* 24.1*  PLT 168 171 169 172    COAGS: Recent Labs    08/21/17 1927  INR 1.00  APTT 37*    BMP: Recent Labs    08/22/17 0521 08/23/17 0549 08/24/17 0525 08/25/17 0639  NA 137 137 137 138  K 4.5 5.8* 6.2* 4.3  CL 105 105 104 101  CO2 24 21* 24 28  GLUCOSE 44* 76 245* 204*  BUN 53* 54* 59* 53*  CALCIUM 8.5* 8.4* 8.5* 8.3*  CREATININE 3.56* 3.61* 4.13* 3.69*  GFRNONAA 11* 11* 9* 11*  GFRAA 13* 13* 11* 12*    LIVER FUNCTION TESTS: Recent Labs    02/28/17 1054  03/06/17 0516 08/20/17 0838 08/22/17 0521 08/24/17 0525  BILITOT 0.6  --   --  0.7 0.8 0.4  AST 20  --   --  19 33 48*  ALT 13*  --   --  9* 13* 25  ALKPHOS 116  --   --  93 82 117  PROT 7.4  --   --  7.0 6.5 6.5  ALBUMIN 3.4*   < > 2.7* 3.2* 2.8* 2.6*   < > = values in this interval not displayed.    TUMOR MARKERS: No results for input(s): AFPTM, CEA, CA199, CHROMGRNA in the last 8760 hours.  Assessment and Plan:  CKD 4 Worsening function Left arm fistula placed 3/21-- not yet mature Need dialysis catheter for initiation of dialysis Hold Plavix now For temporary catheter today Plan for tunneled catheter 5/6 Pt and family are aware of procedure benefits and risks including but not limited to Infection; bleeding; vessel damage Agreeable; consent signed and in chart  Thank you for this interesting consult.  I greatly enjoyed meeting Glenda [name] and look forward to participating in their care.  A copy of this report was sent to the requesting provider on this date.  Electronically Signed: Lavonia Drafts,  PA-C 08/25/2017, 9:33 AM   I spent a total of 40 Minutes    in face to face in clinical consultation, greater than 50% of which was counseling/coordinating care for temporary dialysis catheter placement

## 2017-08-25 NOTE — Progress Notes (Signed)
PROGRESS NOTE    Glenda [name]   SMO:[id number]  DOB: [birth year]  DOA: 08/20/2017 PCP: Angelica Pou, MD   Brief Narrative:  Glenda [name] is an 84 year old female with hypertension, diabetes mellitus 2, coronary artery disease, breast cancer, CKD 4, Chronic diastolic CHF, obstructive sleep apnea who presents to [facility] for shortness of breath.  Chest x-ray revealed pulmonary edema and cardiomegaly.  She was noted to be hypoxic with pulse ox in the 70s.  She was diuresed aggressively and nephrology and cardiology were consulted.  Due to inadequate urine output nephrology has recommended transfer to [facility] for initiation of dialysis. Hospital course was complicated by an episode of atrial fibrillation, peak troponin of 1.73  Subjective: -feels a little better, shortness of breath only with exertion, reported urinating a lot last night    Assessment & Plan:   Principal Problem:   Progressive CKD4 with excess volume and uremia symptoms   Acute respiratory failure with hypoxia/pulmonary edema -nephrology following, poor response to diuretics -She has an AV fistula from 3/21 however not mature to use yet -Plan for temporary HD catheter today, followed by permanent hemodialysis catheter on Monday, Plavix on hold -plan to start HD soon -Diet education  Acute on chronic diastolic CHF -Volume management as above  Hyperkalemia -resolved with Kayexalate  Elevated troponin  -Prior left heart cath in 2012 showed moderate coronary artery disease -Cardiology following, likely demand ischemia from excess volume -Cardiology may consider heart catheterization after starting dialysis -2-D echocardiogram this admission with preserved EF and no wall motion abnormalities -Plavix on hold for procedures  PAF (paroxysmal atrial fibrillation)  - one short episode during this hospital - waiting to see if she has further episodes and holding off on anticoagulation for  now -plan to start warfarin once procedures completed  AOCD - adequate iron stores -B-12 is low-normal, she received subcutaneous injection    Essential hypertension -continue Imdur, Hydralazine, Amlodipine, clonidine  -titrated down meds after HD started  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 There was moderate concentric hypertrophy. Systolic function was normal. The estimated ejection fraction was in the range of 60% to 65%. Wall motion was normal; there were no regional wall motion abnormalities. Features are consistent with a pseudonormal left ventricular filling pattern, with concomitant abnormal relaxation and increased filling pressure (grade 2 diastolic dysfunction). Doppler parameters are consistent with elevated ventricular end-diastolic filling pressure. - Mitral valve: Calcified annulus. Mildly thickened leaflets . There was moderate regurgitation. - Left atrium: The atrium was moderately dilated. - Right ventricle: The cavity size was mildly dilated. Wall thickness was normal. Systolic function was normal. - Tricuspid valve: There was mild regurgitation. - Pulmonary arteries: Systolic pressure was moderately increased. PA peak pressure: 47 mm Hg (S). - Inferior vena cava: The vessel was dilated. The respirophasic diameter changes were blunted (<50%), consistent with elevated central venous pressure. - Pericardium, extracardiac:  There was no pericardial effusion.    Antimicrobials:  Anti-infectives (From admission, onward)   None       Objective:  Vitals:   08/24/17 2208 08/25/17 0104 08/25/17 0420 08/25/17 0934  BP: (!) 188/61  (!) 153/64 (!) 167/52  Pulse: 83  78 75  Resp: 16  16 20   Temp: 99.2 F (37.3 C)  98 F (36.7 C)   TempSrc: Oral     SpO2: 100%  100% 100%  Weight: 75.4 kg (166 lb 3.2 oz) 75.4 kg (166 lb 3.6 oz)    Height:        Intake/Output Summary (Last 24 hours) at 08/25/2017 1156 Last data filed at 08/25/2017 0823 Gross per 24 hour  Intake 782 ml  Output 950 ml  Net -168 ml   Filed Weights   08/24/17 1302 08/24/17 2208 08/25/17 0104  Weight: 75.4 kg (166 lb 3.2 oz) 75.4 kg (166 lb 3.2 oz) 75.4 kg (166 lb 3.6 oz)    Examination: Gen: Awake, Alert, Oriented X 3, no distress HEENT: PERRLA, Neck supple, + JVD Lungs: fine basilar crackles CVS: RRR,No Gallops,Rubs or new Murmurs Abd: soft, Non tender, non distended, BS present Extremities: 2+ edema Skin: no new rashes Psychiatry:  Mood & affect appropriate.     Data Reviewed: I have personally reviewed following labs and imaging studies  CBC: Recent Labs  Lab 08/20/17 1509 08/22/17 0521 08/23/17 0549 08/24/17 0525  WBC 7.0 8.0 6.6 5.3  NEUTROABS  --  4.9  --  3.4  HGB 8.7* 8.2* 7.9* 7.6*  HCT 27.6* 26.1* 24.9* 24.1*  MCV 88.2 87.9 88.0 89.3  PLT 168 171 169 427   Basic Metabolic Panel: Recent Labs  Lab 08/21/17 0548 08/22/17 0521 08/23/17 0549 08/24/17 0525 08/25/17 0639  NA 142 137 137 137 138  K 3.9 4.5 5.8* 6.2* 4.3  CL 107 105 105 104 101  CO2 26 24 21* 24 28  GLUCOSE 67 44* 76 245* 204*  BUN 54* 53* 54* 59* 53*  CREATININE 3.58* 3.56* 3.61* 4.13* 3.69*  CALCIUM 8.7* 8.5* 8.4* 8.5* 8.3*  MG  --  1.5*  --  2.0  --   PHOS  --  2.5  --  4.5  --    GFR: Estimated Creatinine Clearance: 12.4 mL/min (A) (by C-G formula based on SCr of 3.69 mg/dL (H)). Liver Function Tests: Recent Labs  Lab  08/20/17 0838 08/22/17 0521 08/24/17 0525  AST 19 33 48*  ALT 9* 13* 25  ALKPHOS 93 82 117  BILITOT 0.7 0.8 0.4  PROT 7.0 6.5 6.5  ALBUMIN 3.2* 2.8* 2.6*   No results for input(s): LIPASE, AMYLASE in the last 168 hours. No results for input(s): AMMONIA in the last 168 hours. Coagulation Profile: Recent Labs  Lab 08/21/17 1927  INR 1.00   Cardiac Enzymes: Recent Labs  Lab 08/20/17 0838 08/21/17 1807 08/22/17 0016 08/22/17 0521  TROPONINI 0.04* 1.73* 1.48* 1.56*   BNP (last 3 results) No results for input(s): PROBNP in the last 8760 hours. HbA1C: No results for input(s): HGBA1C in the last 72 hours. CBG: Recent Labs  Lab 08/24/17 2205 08/24/17 2257 08/25/17 0216 08/25/17 0722 08/25/17 0817  GLUCAP 51* 132* 197* 203* 205*   Lipid Profile: No results for input(s): CHOL, HDL, LDLCALC, TRIG, CHOLHDL, LDLDIRECT in the last 72 hours. Thyroid Function Tests: No results for input(s): TSH, T4TOTAL, FREET4, T3FREE, THYROIDAB in the last 72 hours. Anemia Panel: Recent Labs    08/24/17 0525  VITAMINB12 261  FOLATE 13.2  FERRITIN 496*  TIBC 167*  IRON 32  RETICCTPCT 3.9*   Urine analysis:  Component Value Date/Time   COLORURINE YELLOW 08/23/2017 0824   APPEARANCEUR CLOUDY (A) 08/23/2017 0824   LABSPEC 1.009 08/23/2017 0824   PHURINE 5.0 08/23/2017 0824   GLUCOSEU NEGATIVE 08/23/2017 0824   HGBUR NEGATIVE 08/23/2017 0824   BILIRUBINUR NEGATIVE 08/23/2017 0824   KETONESUR NEGATIVE 08/23/2017 0824   PROTEINUR NEGATIVE 08/23/2017 0824   UROBILINOGEN 0.2 07/04/2014 1900   NITRITE NEGATIVE 08/23/2017 0824   LEUKOCYTESUR LARGE (A) 08/23/2017 0824   Sepsis Labs: @LABRCNTIP (procalcitonin:4,lacticidven:4) )No results found for this or any previous visit (from the past 240 hour(s)).       Radiology Studies: Dg Chest Port 1 View  Result Date: 08/24/2017 CLINICAL DATA:  Shortness of breath EXAM: PORTABLE CHEST 1 VIEW COMPARISON:  08/22/2017 FINDINGS:  Cardiomegaly. Diffuse interstitial prominence. Perihilar and lower lobe opacities, left greater than right. Findings have worsened since prior study, likely related to edema. No visible significant effusions or acute bony abnormality. IMPRESSION: Increasing interstitial prominence and perihilar and lower lobe opacities, left greater than right, most likely edema/CHF. Electronically Signed   By: Rolm Baptise M.D.   On: 08/24/2017 10:03      Scheduled Meds: . amLODipine  10 mg Oral Daily  . aspirin EC  81 mg Oral Daily  . citalopram  20 mg Oral Daily  . cloNIDine  0.2 mg Oral BID  . feeding supplement (NEPRO CARB STEADY)  237 mL Oral BID BM  . heparin      . heparin injection (subcutaneous)  5,000 Units Subcutaneous Q8H  . hydrALAZINE  25 mg Oral Q8H  . insulin aspart  0-9 Units Subcutaneous TID WC  . insulin aspart  3 Units Subcutaneous TID WC  . isosorbide mononitrate  30 mg Oral Daily  . lidocaine      . metolazone  5 mg Oral Daily  . pantoprazole  40 mg Oral Daily  . pravastatin  80 mg Oral Daily  . sodium chloride flush  3 mL Intravenous Q12H   Continuous Infusions: . sodium chloride    . furosemide Stopped (08/25/17 0030)     LOS: 5 days    Time spent in minutes: [place], MD [facility] Page via [email or web address] 08/25/2017, 11:56 AM

## 2017-08-25 NOTE — Progress Notes (Signed)
   HD cath today Start coumadin this evening (PAF)  Candee Furbish, MD

## 2017-08-25 NOTE — Progress Notes (Signed)
[facility] Progress Note  Subjective: has had temp cath placed by IR this am, no new c/o  Vitals:   08/24/17 2208 08/25/17 0104 08/25/17 0420 08/25/17 0934  BP: (!) 188/61  (!) 153/64 (!) 167/52  Pulse: 83  78 75  Resp: 16  16 20   Temp: 99.2 F (37.3 C)  98 F (36.7 C)   TempSrc: Oral     SpO2: 100%  100% 100%  Weight: 75.4 kg (166 lb 3.2 oz) 75.4 kg (166 lb 3.6 oz)    Height:        Inpatient medications: . amLODipine  10 mg Oral Daily  . aspirin EC  81 mg Oral Daily  . citalopram  20 mg Oral Daily  . cloNIDine  0.2 mg Oral BID  . feeding supplement (NEPRO CARB STEADY)  237 mL Oral BID BM  . heparin      . heparin injection (subcutaneous)  5,000 Units Subcutaneous Q8H  . hydrALAZINE  25 mg Oral Q8H  . insulin aspart  0-9 Units Subcutaneous TID WC  . insulin aspart  3 Units Subcutaneous TID WC  . isosorbide mononitrate  30 mg Oral Daily  . lidocaine      . metolazone  5 mg Oral Daily  . pantoprazole  40 mg Oral Daily  . pravastatin  80 mg Oral Daily  . sodium chloride flush  3 mL Intravenous Q12H   . sodium chloride    . furosemide Stopped (08/25/17 1300)   sodium chloride, acetaminophen, famotidine, lidocaine (PF), ondansetron (ZOFRAN) IV, simethicone, sodium chloride flush  Exam: Gen eldarly aaf no distress up in chair nasal O2 No jvd or bruits Chest occ basilar rales bilat RRR soft 2/6 sem no s3 Abd soft ntnd no mass or ascites +bs MS no joint effusions or deformity Ext 1+ leg edema, no wounds or ulcers Neuro is alert, Ox 3 , nf  home meds:  norvasc 10/ coreg 12.5 bid/ clonidine 0.2 bid/ hydral 25 tid/ zarox 2.5 qd/  torsemide  100 bid  statin/ ppi/ imdur/ 50-50 insulin/ pepcid/ plavix/ celexa/ asa  prns   Impression: 1 acute on ckd 4 - cardiorenal syndrome has lost her response to high-dose diuretics over the past few months now is esrd , has temp cath, to start dialysis tonight will likely need to stay the weekend to get tunneled  cath on Monday, anticipate dc Monday / Tuesday next week if no new issues.  2 vol excess / pulm edema - will address w HD 3 dm2 on insulin 4 hypertension on several bp meds 5 hx cva 6 hx diast chf echo here shows normal lvef 7 hyperkalemia - resolved   Plan - as above   Kelly Splinter MD [facility] pager [phone]   08/25/2017, 4:50 PM   Recent Labs  Lab 08/22/17 0521 08/23/17 0549 08/24/17 0525 08/25/17 0639  NA 137 137 137 138  K 4.5 5.8* 6.2* 4.3  CL 105 105 104 101  CO2 24 21* 24 28  GLUCOSE 44* 76 245* 204*  BUN 53* 54* 59* 53*  CREATININE 3.56* 3.61* 4.13* 3.69*  CALCIUM 8.5* 8.4* 8.5* 8.3*  PHOS 2.5  --  4.5  --    Recent Labs  Lab 08/20/17 0838 08/22/17 0521 08/24/17 0525  AST 19 33 48*  ALT 9* 13* 25  ALKPHOS 93 82 117  BILITOT 0.7 0.8 0.4  PROT 7.0 6.5 6.5  ALBUMIN 3.2* 2.8* 2.6*   Recent Labs  Lab  08/22/17 0521 08/23/17 0549 08/24/17 0525  WBC 8.0 6.6 5.3  NEUTROABS 4.9  --  3.4  HGB 8.2* 7.9* 7.6*  HCT 26.1* 24.9* 24.1*  MCV 87.9 88.0 89.3  PLT 171 169 172   Iron/TIBC/Ferritin/ %Sat    Component Value Date/Time   IRON 32 08/24/2017 0525   TIBC 167 (L) 08/24/2017 0525   FERRITIN 496 (H) 08/24/2017 0525   IRONPCTSAT 19 08/24/2017 0525

## 2017-08-25 NOTE — Procedures (Signed)
Interventional Radiology Procedure Note  Procedure: Right IJ temp HD (trialysis) cathter.    Complications: None Recommendations:  - Ok to convert to tunneled when ready - Will require plavix hold before tunneled catheter - Do not submerge   - OK to use  Signed,  Dulcy Fanny. Earleen Newport, DO

## 2017-08-25 NOTE — Progress Notes (Signed)
Inpatient Diabetes Program Recommendations  AACE/ADA: New Consensus Statement on Inpatient Glycemic Control (2015)  Target Ranges:  Prepandial:   less than 140 mg/dL      Peak postprandial:   less than 180 mg/dL (1-2 hours)      Critically ill patients:  140 - 180 mg/dL   Lab Results  Component Value Date   GLUCAP 205 (H) 08/25/2017   HGBA1C 9.6 (H) 03/01/2017    Review of Glycemic Control Results for Glenda [name], Glenda [name] (MRN [id number]) as of 08/25/2017 10:10  Ref. Range 08/24/2017 15:50 08/24/2017 22:05 08/24/2017 22:57  Glucose-Capillary Latest Ref Range: 65 - 99 mg/dL 184 (H) 51 (L) 132 (H)   Diabetes history: Type 2 DM Home DM Meds: Humalog 50/50 Insulin- 22 units AM/ 18 units PM Current Insulin Orders: Novolog Sensitive Correction Scale/ SSI (0-9 units) TID AC, Novolog 3 units TID with meals  Inpatient Diabetes Program Recommendations:    Last A1C was 9.6% from 02/2017, consider repeat?  Patient refusing morning correction, which tend to agree with patient. Patient has experienced hypoglycemic episodes for last 6 days while inpatient.  Appears that patient is stacking doses of short acting insulin due to renal status.  Noted that Levemir was discontinued. Basal insulin may be a safer route in an attempt to prevent hypoglycemia. Would recommend restarting Levemir at 6 units QD (75.45 kg x 0.09).  Also, consider discontinuing Novolog meal coverage.   Thanks, Bronson Curb, MSN, RNC-OB Diabetes Coordinator [phone] (8a-5p)

## 2017-08-26 DIAGNOSIS — N183 Chronic kidney disease, stage 3 (moderate): Secondary | ICD-10-CM

## 2017-08-26 DIAGNOSIS — J9601 Acute respiratory failure with hypoxia: Secondary | ICD-10-CM

## 2017-08-26 DIAGNOSIS — N179 Acute kidney failure, unspecified: Secondary | ICD-10-CM

## 2017-08-26 LAB — HEPATITIS B SURFACE ANTIGEN: HEP B S AG: NEGATIVE

## 2017-08-26 LAB — GLUCOSE, CAPILLARY
GLUCOSE-CAPILLARY: 332 mg/dL — AB (ref 65–99)
Glucose-Capillary: 174 mg/dL — ABNORMAL HIGH (ref 65–99)
Glucose-Capillary: 233 mg/dL — ABNORMAL HIGH (ref 65–99)
Glucose-Capillary: 268 mg/dL — ABNORMAL HIGH (ref 65–99)
Glucose-Capillary: 265 mg/dL — ABNORMAL HIGH (ref 65–99)

## 2017-08-26 LAB — ALT: ALT: 16 U/L (ref 14–54)

## 2017-08-26 MED ORDER — INSULIN DETEMIR 100 UNIT/ML ~~LOC~~ SOLN
6.0000 [IU] | Freq: Every day | SUBCUTANEOUS | Status: DC
  Administered 2017-08-26: 6 [IU] via SUBCUTANEOUS
  Filled 2017-08-26: qty 0.06

## 2017-08-26 MED ORDER — HEPARIN SODIUM (PORCINE) 1000 UNIT/ML DIALYSIS
1000.0000 [IU] | INTRAMUSCULAR | Status: DC | PRN
  Administered 2017-08-26: 1000 [IU] via INTRAVENOUS_CENTRAL

## 2017-08-26 MED ORDER — HEPARIN SODIUM (PORCINE) 1000 UNIT/ML DIALYSIS: 3500.0000 [IU] | INTRAMUSCULAR | Status: DC | PRN

## 2017-08-26 NOTE — Progress Notes (Signed)
HD tx completed @ 0120 w/ problem, UF goal met, blood rinsed back, VSS, report called to Tawni Carnes, RN

## 2017-08-26 NOTE — Progress Notes (Signed)
PROGRESS NOTE    Glenda [name]   DGL:[id number]  DOB: 12-27-1933  DOA: 08/20/2017 PCP: Angelica Pou, MD   Brief Narrative:  Glenda [name] is an 84 year old female with hypertension, diabetes mellitus 2, coronary artery disease, breast cancer, CKD 4, Chronic diastolic CHF, obstructive sleep apnea who presents to [facility] for shortness of breath.  Chest x-ray revealed pulmonary edema and cardiomegaly.  She was noted to be hypoxic with pulse ox in the 70s.  She was diuresed aggressively and nephrology and cardiology were consulted.  Due to inadequate urine output nephrology has recommended transfer to [facility] for initiation of dialysis. Hospital course was complicated by an episode of atrial fibrillation, peak troponin of 1.73  Subjective: -breathing much better after starting dialysis, in good spirits    Assessment & Plan:     Progressive CKD4 with excess volume and uremia symptoms   Acute respiratory failure with hypoxia/pulmonary edema -nephrology following, poor response to diuretics -She has an AV fistula from 3/21 however not mature to use yet -s/p temporary HD catheter 5/2, started hemodialysis 5/2 -clinically improving now -Plan for permanent hemodialysis catheter on Monday, Plavix on hold -Diet education  Acute on chronic diastolic CHF -Volume management as above  Hyperkalemia -resolved with Kayexalate  NSTEMI vs Demand ischemia -Prior left heart cath in 2012 showed moderate coronary artery disease -Cardiology following, likely demand ischemia from excess volume -Cardiology now recommending medical management -2-D echocardiogram this admission with preserved EF and no wall motion abnormalities -Plavix on hold for procedures  PAF (paroxysmal atrial fibrillation)  - 2 brief episodes of paroxysmal atrial fibrillation noted during this hospital stay  -plan to start warfarin once procedures completed, on Monday  AOCD - adequate iron  stores -B-12 is low-normal, she received subcutaneous injection    Essential hypertension -continue Imdur, Hydralazine, Amlodipine, clonidine  -titrated down meds after HD started  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 There was moderate concentric hypertrophy. Systolic function was normal. The estimated ejection fraction was in the range of 60% to 65%. Wall motion was normal; there were no regional wall motion abnormalities. Features are consistent with a pseudonormal left ventricular filling pattern, with concomitant abnormal relaxation and increased filling pressure (grade 2 diastolic dysfunction). Doppler parameters are consistent with elevated ventricular end-diastolic filling pressure. - Mitral valve: Calcified annulus. Mildly thickened leaflets . There was moderate regurgitation. - Left atrium: The atrium was moderately dilated. - Right ventricle: The cavity size was mildly dilated. Wall thickness was normal. Systolic function was normal. - Tricuspid valve: There was mild regurgitation. - Pulmonary arteries: Systolic pressure was moderately increased. PA peak pressure: 47 mm Hg (S). - Inferior vena cava: The vessel was dilated. The respirophasic diameter changes were blunted (<50%), consistent with elevated central venous pressure. - Pericardium,  extracardiac: There was no pericardial effusion.    Antimicrobials:  Anti-infectives (From admission, onward)   None       Objective: Vitals:  08/26/17 0128 08/26/17 0237 08/26/17 0535 08/26/17 0943  BP: (!) 196/73 (!) 181/61 (!) 172/59 (!) 171/58  Pulse: 94 82 76 74  Resp: 16 20 16    Temp: 98.1 F (36.7 C) 98.9 F (37.2 C) 97.8 F (36.6 C) 98.2 F (36.8 C)  TempSrc: Oral Oral Oral Oral  SpO2: 100% 100% 100% 96%  Weight: 70.5 kg (155 lb 6.8 oz)     Height:        Intake/Output Summary (Last 24 hours) at 08/26/2017 1141 Last data filed at 08/26/2017 0601 Gross per 24 hour  Intake 426 ml  Output 2706 ml  Net -2280 ml   Filed Weights   08/25/17 2024 08/25/17 2240 08/26/17 0128  Weight: 73.3 kg (161 lb 9.6 oz) 72.5 kg (159 lb 13.3 oz) 70.5 kg (155 lb 6.8 oz)    Examination: Gen: Awake, Alert, Oriented X 3, pleasant female HEENT: PERRLA, Neck supple, no JVD Lungs: Good air movement bilaterally, CTAB CVS: RRR,No Gallops,Rubs or new Murmurs Abd: soft, Non tender, non distended, BS present Extremities: 1+ edema Skin: no new rashes inPsychiatry:  Mood & affect appropriate.     Data Reviewed: I have personally reviewed following labs and imaging studies  CBC: Recent Labs  Lab 08/20/17 1509 08/22/17 0521 08/23/17 0549 08/24/17 0525  WBC 7.0 8.0 6.6 5.3  NEUTROABS  --  4.9  --  3.4  HGB 8.7* 8.2* 7.9* 7.6*  HCT 27.6* 26.1* 24.9* 24.1*  MCV 88.2 87.9 88.0 89.3  PLT 168 171 169 366   Basic Metabolic Panel: Recent Labs  Lab 08/21/17 0548 08/22/17 0521 08/23/17 0549 08/24/17 0525 08/25/17 0639  NA 142 137 137 137 138  K 3.9 4.5 5.8* 6.2* 4.3  CL 107 105 105 104 101  CO2 26 24 21* 24 28  GLUCOSE 67 44* 76 245* 204*  BUN 54* 53* 54* 59* 53*  CREATININE 3.58* 3.56* 3.61* 4.13* 3.69*  CALCIUM 8.7* 8.5* 8.4* 8.5* 8.3*  MG  --  1.5*  --  2.0  --   PHOS  --  2.5  --  4.5  --    GFR: Estimated Creatinine Clearance: 11.2 mL/min (A) (by C-G formula based on  SCr of 3.69 mg/dL (H)). Liver Function Tests: Recent Labs  Lab 08/20/17 0838 08/22/17 0521 08/24/17 0525 08/25/17 2340  AST 19 33 48*  --   ALT 9* 13* 25 16  ALKPHOS 93 82 117  --   BILITOT 0.7 0.8 0.4  --   PROT 7.0 6.5 6.5  --   ALBUMIN 3.2* 2.8* 2.6*  --    No results for input(s): LIPASE, AMYLASE in the last 168 hours. No results for input(s): AMMONIA in the last 168 hours. Coagulation Profile: Recent Labs  Lab 08/21/17 1927  INR 1.00   Cardiac Enzymes: Recent Labs  Lab 08/20/17 0838 08/21/17 1807 08/22/17 0016 08/22/17 0521  TROPONINI 0.04* 1.73* 1.48* 1.56*   BNP (last 3 results) No results for input(s): PROBNP in the last 8760 hours. HbA1C: No results for input(s): HGBA1C in the last 72 hours. CBG: Recent Labs  Lab 08/25/17 1704 08/25/17 2051 08/26/17 0240 08/26/17 0728 08/26/17 1132  GLUCAP 349* 248* 174* 233* 332*   Lipid Profile: No results for input(s): CHOL, HDL, LDLCALC, TRIG, CHOLHDL, LDLDIRECT in the last 72 hours. Thyroid Function Tests: No results for input(s): TSH, T4TOTAL, FREET4, T3FREE, THYROIDAB in the last 72 hours. Anemia Panel: Recent Labs    08/24/17 0525  VITAMINB12 261  FOLATE 13.2  FERRITIN 496*  TIBC 167*  IRON 32  RETICCTPCT 3.9*   Urine analysis:    Component Value Date/Time   COLORURINE YELLOW 08/23/2017 0824   APPEARANCEUR CLOUDY (A) 08/23/2017 0824   LABSPEC 1.009 08/23/2017 0824   PHURINE 5.0 08/23/2017 0824   GLUCOSEU NEGATIVE 08/23/2017 0824   HGBUR NEGATIVE 08/23/2017 0824   BILIRUBINUR NEGATIVE 08/23/2017 0824   KETONESUR NEGATIVE 08/23/2017 0824   PROTEINUR NEGATIVE 08/23/2017 0824   UROBILINOGEN 0.2 07/04/2014 1900   NITRITE NEGATIVE 08/23/2017 0824   LEUKOCYTESUR LARGE (A) 08/23/2017 0824   Sepsis Labs: @LABRCNTIP (procalcitonin:4,lacticidven:4) )No results found for this or any previous visit (from the past 240 hour(s)).       Radiology Studies: Ir Fluoro Guide Cv Line Right  Result  Date: 08/25/2017 INDICATION: 84 year old female with a history renal failure. EXAM: IMAGE GUIDED TEMPORARY HEMODIALYSIS CATHETER PLACEMENT MEDICATIONS: None ANESTHESIA/SEDATION: None FLUOROSCOPY TIME:  Fluoroscopy Time: 0 minutes 6 seconds (1.8 mGy). COMPLICATIONS: None PROCEDURE: Informed written consent was obtained from the patient and the patient's family after a thorough discussion of the procedural risks, benefits and alternatives. All questions were addressed. A timeout was performed prior to the initiation of the procedure. The right neck and chest was prepped with chlorhexidine, and draped in the usual sterile fashion using maximum barrier technique (cap and mask, sterile gown, sterile gloves, large sterile sheet, hand hygiene and cutaneous antiseptic). Local anesthesia was attained by infiltration with 1% lidocaine without epinephrine. Ultrasound demonstrated patency of the right internal jugular vein, and this was documented with an image. Under real-time ultrasound guidance, this vein was accessed with a 21 gauge micropuncture needle and image documentation was performed. A small dermatotomy was made at the access site with an 11 scalpel. A 0.018" wire was advanced into the SVC and the access needle exchanged for a 65F micropuncture vascular sheath. The 0.018" wire was then removed and a 0.035" wire advanced into the IVC. Upon withdrawal of the 018 wire, the wire was marked for appropriate length of the internal portion of the catheter. A 19 cm catheter was selected. Skin and subcutaneous tissues were serially dilated. Catheter was placed on the wire. The catheter tip is positioned in the upper right atrium. This was documented with a spot image. Both ports of the hemodialysis catheter were then tested for excellent function. The ports were then locked with heparinized lock. Patient tolerated the procedure well and remained hemodynamically stable throughout. No complications were encountered and no  significant blood loss was encountered. IMPRESSION: Status post right IJ temporary HD catheter placement. Catheter ready for use. Catheter may be converted if required. Signed, Dulcy Fanny. Earleen Newport, DO [facility] Radiology Electronically Signed   By: Corrie Mckusick D.O.   On: 08/25/2017 12:10   Ir US Guide Vasc Access Right  Result Date: 08/25/2017 INDICATION: 84 year old female with a history renal failure. EXAM: IMAGE GUIDED TEMPORARY HEMODIALYSIS CATHETER PLACEMENT MEDICATIONS: None ANESTHESIA/SEDATION: None FLUOROSCOPY TIME:  Fluoroscopy Time: 0 minutes 6 seconds (1.8 mGy). COMPLICATIONS: None PROCEDURE: Informed written consent was obtained from the patient and the patient's family after a thorough discussion of the procedural risks, benefits and alternatives. All questions were addressed. A timeout was performed prior to the initiation of the procedure. The right neck and chest was prepped with chlorhexidine, and draped in the usual sterile fashion using maximum barrier technique (cap and mask, sterile gown, sterile gloves, large sterile sheet, hand hygiene and cutaneous antiseptic). Local anesthesia was attained by infiltration with 1%  lidocaine without epinephrine. Ultrasound demonstrated patency of the right internal jugular vein, and this was documented with an image. Under real-time ultrasound guidance, this vein was accessed with a 21 gauge micropuncture needle and image documentation was performed. A small dermatotomy was made at the access site with an 11 scalpel. A 0.018" wire was advanced into the SVC and the access needle exchanged for a 71F micropuncture vascular sheath. The 0.018" wire was then removed and a 0.035" wire advanced into the IVC. Upon withdrawal of the 018 wire, the wire was marked for appropriate length of the internal portion of the catheter. A 19 cm catheter was selected. Skin and subcutaneous tissues were serially dilated. Catheter was  placed on the wire. The catheter tip is positioned in the upper right atrium. This was documented with a spot image. Both ports of the hemodialysis catheter were then tested for excellent function. The ports were then locked with heparinized lock. Patient tolerated the procedure well and remained hemodynamically stable throughout. No complications were encountered and no significant blood loss was encountered. IMPRESSION: Status post right IJ temporary HD catheter placement. Catheter ready for use. Catheter may be converted if required. Signed, Dulcy Fanny. Earleen Newport, DO [facility] Radiology Electronically Signed   By: Corrie Mckusick D.O.   On: 08/25/2017 12:10      Scheduled Meds: . amLODipine  10 mg Oral Daily  . aspirin EC  81 mg Oral Daily  . citalopram  20 mg Oral Daily  . cloNIDine  0.2 mg Oral BID  . feeding supplement (NEPRO CARB STEADY)  237 mL Oral BID BM  . heparin injection (subcutaneous)  5,000 Units Subcutaneous Q8H  . hydrALAZINE  25 mg Oral Q8H  . insulin aspart  0-9 Units Subcutaneous TID WC  . insulin aspart  3 Units Subcutaneous TID WC  . isosorbide mononitrate  30 mg Oral Daily  . metolazone  5 mg Oral Daily  . pantoprazole  40 mg Oral Daily  . pravastatin  80 mg Oral Daily  . sodium chloride flush  3 mL Intravenous Q12H   Continuous Infusions: . sodium chloride    . sodium chloride    . furosemide Stopped (08/26/17 0339)     LOS: 6 days    Time spent in minutes: 25  Domenic Polite, MD [facility] Page via [email or web address] Ota [facility] 08/26/2017, 11:41 AM

## 2017-08-26 NOTE — Progress Notes (Signed)
Inpatient Diabetes Program Recommendations  AACE/ADA: New Consensus Statement on Inpatient Glycemic Control (2015)  Target Ranges:  Prepandial:   less than 140 mg/dL      Peak postprandial:   less than 180 mg/dL (1-2 hours)      Critically ill patients:  140 - 180 mg/dL   Lab Results  Component Value Date   GLUCAP 233 (H) 08/26/2017   HGBA1C 9.6 (H) 03/01/2017    Review of Glycemic Control Results for RACINE, ERBY (MRN [id number]) as of 08/26/2017 10:39  Ref. Range 08/25/2017 11:52 08/25/2017 17:04 08/25/2017 20:51 08/26/2017 02:40 08/26/2017 07:28  Glucose-Capillary Latest Ref Range: 65 - 99 mg/dL 197 (H) 349 (H) 248 (H) 174 (H) 233 (H)  Diabetes history: Type 2 DM Home DM Meds:Humalog 50/50 Insulin- 22 units AM/ 18 units PM Current Insulin Orders:Novolog Sensitive Correction Scale/ SSI (0-9 units) TID AC, Novolog 3 units TID with meals  Inpatient Diabetes Program Recommendations:    Please restart Levemir 6 units daily.  Also consider reducing Novolog correction to custom scale starting at 151 mg/dL/  151-200 mg/dL-1 unit 201-250 mg/dL-2 units 251-300 mg/dL-3 units 301-350 mg/dL-4 units 351-400 mg/dL- 5 units.  Thanks,  Adah Perl, RN, BC-ADM Inpatient Diabetes Coordinator Pager [phone] (8a-5p)

## 2017-08-26 NOTE — Progress Notes (Signed)
[facility] Progress Note  Subjective: no new c/o no issues w first hd yest  Vitals:   08/26/17 0128 08/26/17 0237 08/26/17 0535 08/26/17 0943  BP: (!) 196/73 (!) 181/61 (!) 172/59 (!) 171/58  Pulse: 94 82 76 74  Resp: 16 20 16    Temp: 98.1 F (36.7 C) 98.9 F (37.2 C) 97.8 F (36.6 C) 98.2 F (36.8 C)  TempSrc: Oral Oral Oral Oral  SpO2: 100% 100% 100% 96%  Weight: 70.5 kg (155 lb 6.8 oz)     Height:        Inpatient medications: . amLODipine  10 mg Oral Daily  . aspirin EC  81 mg Oral Daily  . citalopram  20 mg Oral Daily  . cloNIDine  0.2 mg Oral BID  . feeding supplement (NEPRO CARB STEADY)  237 mL Oral BID BM  . heparin injection (subcutaneous)  5,000 Units Subcutaneous Q8H  . hydrALAZINE  25 mg Oral Q8H  . insulin aspart  0-9 Units Subcutaneous TID WC  . insulin aspart  3 Units Subcutaneous TID WC  . insulin detemir  6 Units Subcutaneous QHS  . isosorbide mononitrate  30 mg Oral Daily  . metolazone  5 mg Oral Daily  . pantoprazole  40 mg Oral Daily  . pravastatin  80 mg Oral Daily  . sodium chloride flush  3 mL Intravenous Q12H   . sodium chloride    . sodium chloride    . furosemide 120 mg (08/26/17 1139)   sodium chloride, sodium chloride, acetaminophen, famotidine, heparin, heparin, lidocaine (PF), lidocaine-prilocaine, ondansetron (ZOFRAN) IV, pentafluoroprop-tetrafluoroeth, simethicone, sodium chloride flush  Exam: Gen eldarly aaf no distress up in chair nasal O2 No jvd or bruits Chest clear left base, right base dec'd bs but o/w clear RRR soft 2/6 sem no s3 Abd soft ntnd no mass or ascites +bs MS no joint effusions or deformity Ext 1+ leg edema bilat Neuro is alert, Ox 3 , nf  home meds:  norvasc 10/ coreg 12.5 bid/ clonidine 0.2 bid/ hydral 25 tid/ zarox 2.5 qd/   torsemide  100 bid  statin/ ppi/ imdur/ 50-50 insulin/ pepcid/ plavix/ celexa/ asa  prns   Impression: 1 ckd stage 5 - new esrd , 1st dialysis yest plan 2nd hd  tomorrow starting clip process will cont to lower vol and will ask ir to see patient for tunneled cath on monday 2 vol excess / pulm edema - 2 liters off yest cont to lower vol as tol 3 dm2 on insulin 4 hypertension on several bp meds 5 hx cva 6 hx diast chf echo here shows normal lvef   Plan - as above   Kelly Splinter MD [facility] pager [phone]   08/26/2017, 12:57 PM   Recent Labs  Lab 08/22/17 0521 08/23/17 0549 08/24/17 0525 08/25/17 0639  NA 137 137 137 138  K 4.5 5.8* 6.2* 4.3  CL 105 105 104 101  CO2 24 21* 24 28  GLUCOSE 44* 76 245* 204*  BUN 53* 54* 59* 53*  CREATININE 3.56* 3.61* 4.13* 3.69*  CALCIUM 8.5* 8.4* 8.5* 8.3*  PHOS 2.5  --  4.5  --    Recent Labs  Lab 08/20/17 0838 08/22/17 0521 08/24/17 0525 08/25/17 2340  AST 19 33 48*  --   ALT 9* 13* 25 16  ALKPHOS 93 82 117  --   BILITOT 0.7 0.8 0.4  --   PROT 7.0 6.5 6.5  --   ALBUMIN 3.2* 2.8*  2.6*  --    Recent Labs  Lab 08/22/17 0521 08/23/17 0549 08/24/17 0525  WBC 8.0 6.6 5.3  NEUTROABS 4.9  --  3.4  HGB 8.2* 7.9* 7.6*  HCT 26.1* 24.9* 24.1*  MCV 87.9 88.0 89.3  PLT 171 169 172   Iron/TIBC/Ferritin/ %Sat    Component Value Date/Time   IRON 32 08/24/2017 0525   TIBC 167 (L) 08/24/2017 0525   FERRITIN 496 (H) 08/24/2017 0525   IRONPCTSAT 19 08/24/2017 0525

## 2017-08-26 NOTE — Progress Notes (Signed)
Referring Physician(s): Dr Mickel Crow  Supervising Physician: Aletta Edouard  Patient Status:  [facility] - In-pt  Chief Complaint:  Hx CVA; HTN; CAD; Breast Ca; OSA CKD 4 Worsening renal function Left arm Fistula placed 3/21--- not yet mature Need catheter placement for now to initiate dialysis  Subjective:  Temp cath placed 5/2 in IR Pt now off Plavix since 5/1 Plan for tunneled catheter 5/6   Allergies: Patient has no known allergies.  Medications: Prior to Admission medications   Medication Sig Start Date End Date Taking? Authorizing Provider  acetaminophen (TYLENOL) 500 MG tablet Take 1,000 mg by mouth daily as needed for moderate pain or headache.   Yes [provider]  amLODipine (NORVASC) 10 MG tablet Take 10 mg by mouth daily.    Yes [provider]  aspirin EC 81 MG tablet Take 81 mg by mouth daily.   Yes [provider]  carvedilol (COREG) 12.5 MG tablet Take 1 tablet (12.5 mg total) by mouth 2 (two) times daily with a meal. 01/06/17  Yes Regalado, Belkys A, MD  cetirizine (ZYRTEC) 10 MG tablet Take 10 mg daily by mouth.   Yes [provider]  citalopram (CELEXA) 20 MG tablet Take 20 mg by mouth daily.   Yes [provider]  cloNIDine (CATAPRES) 0.2 MG tablet Take 0.2 mg by mouth 2 (two) times daily.    Yes [provider]  clopidogrel (PLAVIX) 75 MG tablet Take 75 mg by mouth daily.    Yes [provider]  famotidine (PEPCID AC) 10 MG chewable tablet Chew 10 mg by mouth daily as needed for heartburn.   Yes [provider]  HUMALOG MIX 50/50 KWIKPEN (50-50) 100 UNIT/ML Kwikpen Inject 18-22 Units into the skin 2 (two) times daily. Inject 22 units in the morning and 18 units at bedtime 01/06/17  Yes [provider]  hydrALAZINE (APRESOLINE) 25 MG tablet Take 1 tablet (25 mg total) every 8 (eight) hours by mouth. 03/06/17  Yes Thurnell Lose, MD  isosorbide mononitrate (IMDUR) 30 MG 24 hr  tablet Take 1 tablet (30 mg total) daily by mouth. 03/06/17 08/20/17 Yes Thurnell Lose, MD  metolazone (ZAROXOLYN) 2.5 MG tablet Take 2.5 mg by mouth daily.   Yes [provider]  Nutritional Supplements (FEEDING SUPPLEMENT, NEPRO CARB STEADY,) LIQD Take 237 mLs by mouth 2 (two) times daily between meals. 01/06/17  Yes Regalado, Belkys A, MD  pantoprazole (PROTONIX) 40 MG tablet Take 40 mg by mouth daily.   Yes [provider]  potassium chloride SA (K-DUR,KLOR-CON) 20 MEQ tablet Take 2 tablets (40 mEq total) daily by mouth. 03/07/17  Yes Thurnell Lose, MD  pravastatin (PRAVACHOL) 80 MG tablet Take 80 mg by mouth daily.   Yes [provider]  simethicone (GAS-X) 80 MG chewable tablet Chew 80 mg by mouth every 6 (six) hours as needed for flatulence.   Yes [provider]  torsemide (DEMADEX) 100 MG tablet Take 1 tablet (100 mg total) 2 (two) times daily by mouth. 03/06/17  Yes Thurnell Lose, MD  oxyCODONE-acetaminophen (PERCOCET) 5-325 MG tablet Take 1 tablet by mouth every 8 (eight) hours as needed for severe pain. Patient not taking: Reported on 08/17/2017 07/14/17   Gabriel Earing, PA-C     Vital Signs: BP (!) 171/58 (BP Location: Right Arm)   Pulse 74   Temp 98.2 F (36.8 C) (Oral)   Resp 16   Ht 5\' 6"  (1.676 m)  Wt 155 lb 6.8 oz (70.5 kg)   SpO2 96%   BMI 25.09 kg/m   Physical Exam  Constitutional: She is oriented to person, place, and time.  Cardiovascular: Normal rate.  Pulmonary/Chest: Effort normal and breath sounds normal.  Abdominal: Soft. Bowel sounds are normal.  Musculoskeletal: Normal range of motion.  Neurological: She is alert and oriented to person, place, and time.  Skin: Skin is warm and dry.  Psychiatric: She has a normal mood and affect. Her behavior is normal.  Consent signed by daughter at bedside  Nursing note and vitals reviewed.   Imaging: Dg Chest 2 View  Result Date: 08/22/2017 CLINICAL DATA:  CHF  with shortness of breath EXAM: CHEST - 2 VIEW COMPARISON:  08/22/2017, 08/20/2017, 03/06/2017 FINDINGS: Low lung volumes. Tiny pleural effusions. Mild cardiomegaly with vascular congestion, overall decreased. Decreased pulmonary edema with minimal residual. Aortic atherosclerosis. No pneumothorax. IMPRESSION: 1. Low lung volumes. 2. Cardiomegaly with vascular congestion and minimal edema, overall improved since prior radiograph 3. Small pleural effusions. Electronically Signed   By: Donavan Foil M.D.   On: 08/22/2017 20:00   Ir Fluoro Guide Cv Line Right  Result Date: 08/25/2017 INDICATION: 84 year old female with a history renal failure. EXAM: IMAGE GUIDED TEMPORARY HEMODIALYSIS CATHETER PLACEMENT MEDICATIONS: None ANESTHESIA/SEDATION: None FLUOROSCOPY TIME:  Fluoroscopy Time: 0 minutes 6 seconds (1.8 mGy). COMPLICATIONS: None PROCEDURE: Informed written consent was obtained from the patient and the patient's family after a thorough discussion of the procedural risks, benefits and alternatives. All questions were addressed. A timeout was performed prior to the initiation of the procedure. The right neck and chest was prepped with chlorhexidine, and draped in the usual sterile fashion using maximum barrier technique (cap and mask, sterile gown, sterile gloves, large sterile sheet, hand hygiene and cutaneous antiseptic). Local anesthesia was attained by infiltration with 1% lidocaine without epinephrine. Ultrasound demonstrated patency of the right internal jugular vein, and this was documented with an image. Under real-time ultrasound guidance, this vein was accessed with a 21 gauge micropuncture needle and image documentation was performed. A small dermatotomy was made at the access site with an 11 scalpel. A 0.018" wire was advanced into the SVC and the access needle exchanged for a 14F micropuncture vascular sheath. The 0.018" wire was then removed and a 0.035" wire advanced into the IVC. Upon withdrawal of  the 018 wire, the wire was marked for appropriate length of the internal portion of the catheter. A 19 cm catheter was selected. Skin and subcutaneous tissues were serially dilated. Catheter was placed on the wire. The catheter tip is positioned in the upper right atrium. This was documented with a spot image. Both ports of the hemodialysis catheter were then tested for excellent function. The ports were then locked with heparinized lock. Patient tolerated the procedure well and remained hemodynamically stable throughout. No complications were encountered and no significant blood loss was encountered. IMPRESSION: Status post right IJ temporary HD catheter placement. Catheter ready for use. Catheter may be converted if required. Signed, Dulcy Fanny. Earleen Newport, DO [facility] Radiology Electronically Signed   By: Corrie Mckusick D.O.   On: 08/25/2017 12:10   Ir US Guide Vasc Access Right  Result Date: 08/25/2017 INDICATION: 84 year old female with a history renal failure. EXAM: IMAGE GUIDED TEMPORARY HEMODIALYSIS CATHETER PLACEMENT MEDICATIONS: None ANESTHESIA/SEDATION: None FLUOROSCOPY TIME:  Fluoroscopy Time: 0 minutes 6 seconds (1.8 mGy). COMPLICATIONS: None PROCEDURE: Informed written consent was obtained from the patient and the patient's family  after a thorough discussion of the procedural risks, benefits and alternatives. All questions were addressed. A timeout was performed prior to the initiation of the procedure. The right neck and chest was prepped with chlorhexidine, and draped in the usual sterile fashion using maximum barrier technique (cap and mask, sterile gown, sterile gloves, large sterile sheet, hand hygiene and cutaneous antiseptic). Local anesthesia was attained by infiltration with 1% lidocaine without epinephrine. Ultrasound demonstrated patency of the right internal jugular vein, and this was documented with an image. Under real-time ultrasound  guidance, this vein was accessed with a 21 gauge micropuncture needle and image documentation was performed. A small dermatotomy was made at the access site with an 11 scalpel. A 0.018" wire was advanced into the SVC and the access needle exchanged for a 70F micropuncture vascular sheath. The 0.018" wire was then removed and a 0.035" wire advanced into the IVC. Upon withdrawal of the 018 wire, the wire was marked for appropriate length of the internal portion of the catheter. A 19 cm catheter was selected. Skin and subcutaneous tissues were serially dilated. Catheter was placed on the wire. The catheter tip is positioned in the upper right atrium. This was documented with a spot image. Both ports of the hemodialysis catheter were then tested for excellent function. The ports were then locked with heparinized lock. Patient tolerated the procedure well and remained hemodynamically stable throughout. No complications were encountered and no significant blood loss was encountered. IMPRESSION: Status post right IJ temporary HD catheter placement. Catheter ready for use. Catheter may be converted if required. Signed, Dulcy Fanny. Earleen Newport, DO [facility] Radiology Electronically Signed   By: Corrie Mckusick D.O.   On: 08/25/2017 12:10   Dg Chest Port 1 View  Result Date: 08/24/2017 CLINICAL DATA:  Shortness of breath EXAM: PORTABLE CHEST 1 VIEW COMPARISON:  08/22/2017 FINDINGS: Cardiomegaly. Diffuse interstitial prominence. Perihilar and lower lobe opacities, left greater than right. Findings have worsened since prior study, likely related to edema. No visible significant effusions or acute bony abnormality. IMPRESSION: Increasing interstitial prominence and perihilar and lower lobe opacities, left greater than right, most likely edema/CHF. Electronically Signed   By: Rolm Baptise M.D.   On: 08/24/2017 10:03    Labs:  CBC: Recent Labs    08/20/17 1509 08/22/17 0521  08/23/17 0549 08/24/17 0525  WBC 7.0 8.0 6.6 5.3  HGB 8.7* 8.2* 7.9* 7.6*  HCT 27.6* 26.1* 24.9* 24.1*  PLT 168 171 169 172    COAGS: Recent Labs    08/21/17 1927  INR 1.00  APTT 37*    BMP: Recent Labs    08/22/17 0521 08/23/17 0549 08/24/17 0525 08/25/17 0639  NA 137 137 137 138  K 4.5 5.8* 6.2* 4.3  CL 105 105 104 101  CO2 24 21* 24 28  GLUCOSE 44* 76 245* 204*  BUN 53* 54* 59* 53*  CALCIUM 8.5* 8.4* 8.5* 8.3*  CREATININE 3.56* 3.61* 4.13* 3.69*  GFRNONAA 11* 11* 9* 11*  GFRAA 13* 13* 11* 12*    LIVER FUNCTION TESTS: Recent Labs    02/28/17 1054  03/06/17 0516 08/20/17 0838 08/22/17 0521 08/24/17 0525 08/25/17 2340  BILITOT 0.6  --   --  0.7 0.8 0.4  --   AST 20  --   --  19 33 48*  --   ALT 13*  --   --  9* 13* 25 16  ALKPHOS 116  --   --  93 82  117  --   PROT 7.4  --   --  7.0 6.5 6.5  --   ALBUMIN 3.4*   < > 2.7* 3.2* 2.8* 2.6*  --    < > = values in this interval not displayed.    Assessment and Plan:  ESRD Left arm fistula not yet mature Off Plavix since 5/1 Scheduled for tunneled dialysis catheter placement 5/6 in IR Risks and benefits discussed with the patient and family including, but not limited to bleeding, infection, vascular injury, pneumothorax which may require chest tube placement, air embolism or even death  All of the patient and family questions were answered, they are agreeable to proceed. Consent signed and in chart.   Electronically Signed: Lavonia Drafts, PA-C 08/26/2017, 2:09 PM   I spent a total of 25 Minutes at the the patient's bedside AND on the patient's hospital floor or unit, greater than 50% of which was counseling/coordinating care for tunneled HD cath placement

## 2017-08-26 NOTE — Progress Notes (Signed)
HD tx initiated via HD cath w/ some cath function issues, AP: pull/push/flush well, VP: pull/push/flush sluggish, VSS w/ high bp, will cont to monitor while on HD tx

## 2017-08-26 NOTE — Progress Notes (Signed)
Progress Note  Patient Name: Glenda [name] Date of Encounter: 08/26/2017  Primary Cardiologist: Ena Dawley, MD   Subjective   Received first session of hemodialysis yesterday after placement of temp catheter.  No chest pain, no shortness of breath. Resting comfortably. Daughter at bedside  Inpatient Medications    Scheduled Meds: . amLODipine  10 mg Oral Daily  . aspirin EC  81 mg Oral Daily  . citalopram  20 mg Oral Daily  . cloNIDine  0.2 mg Oral BID  . feeding supplement (NEPRO CARB STEADY)  237 mL Oral BID BM  . heparin injection (subcutaneous)  5,000 Units Subcutaneous Q8H  . hydrALAZINE  25 mg Oral Q8H  . insulin aspart  0-9 Units Subcutaneous TID WC  . insulin aspart  3 Units Subcutaneous TID WC  . isosorbide mononitrate  30 mg Oral Daily  . metolazone  5 mg Oral Daily  . pantoprazole  40 mg Oral Daily  . pravastatin  80 mg Oral Daily  . sodium chloride flush  3 mL Intravenous Q12H   Continuous Infusions: . sodium chloride    . sodium chloride    . furosemide Stopped (08/26/17 0339)   PRN Meds: sodium chloride, sodium chloride, acetaminophen, famotidine, heparin, heparin, lidocaine (PF), lidocaine-prilocaine, ondansetron (ZOFRAN) IV, pentafluoroprop-tetrafluoroeth, simethicone, sodium chloride flush   Vital Signs    Vitals:   08/26/17 0120 08/26/17 0128 08/26/17 0237 08/26/17 0535  BP: (!) 182/73 (!) 196/73 (!) 181/61 (!) 172/59  Pulse: 76 94 82 76  Resp: 13 16 20 16   Temp:  98.1 F (36.7 C) 98.9 F (37.2 C) 97.8 F (36.6 C)  TempSrc:  Oral Oral Oral  SpO2: 100% 100% 100% 100%  Weight:  155 lb 6.8 oz (70.5 kg)    Height:        Intake/Output Summary (Last 24 hours) at 08/26/2017 0741 Last data filed at 08/26/2017 0601 Gross per 24 hour  Intake 426 ml  Output 3006 ml  Net -2580 ml   Filed Weights   08/25/17 2024 08/25/17 2240 08/26/17 0128  Weight: 161 lb 9.6 oz (73.3 kg) 159 lb 13.3 oz (72.5 kg) 155 lb 6.8 oz (70.5 kg)    Telemetry      She had another brief episode of atrial fibrillation on telemetry with rapid ventricular response, currently sinus rhythm- Personally Reviewed  ECG    Atrial fibrillation 97 bpm - Personally Reviewed  Physical Exam   GEN: Well nourished, well developed, in no acute distress  HEENT: normal, temporary HD catheter Neck: no JVD Respiratory:  Normal work of breathing flat MS: no deformity or atrophy  Skin: warm and dry, no rash, left arm AV fistula Neuro: Sleeping Psych: No changes   Labs    Chemistry Recent Labs  Lab 08/20/17 5697  08/22/17 0521 08/23/17 0549 08/24/17 0525 08/25/17 0639 08/25/17 2340  NA 140   < > 137 137 137 138  --   K 3.9   < > 4.5 5.8* 6.2* 4.3  --   CL 104   < > 105 105 104 101  --   CO2 22   < > 24 21* 24 28  --   GLUCOSE 362*   < > 44* 76 245* 204*  --   BUN 55*   < > 53* 54* 59* 53*  --   CREATININE 3.65*   < > 3.56* 3.61* 4.13* 3.69*  --   CALCIUM 8.7*   < > 8.5* 8.4* 8.5* 8.3*  --  PROT 7.0  --  6.5  --  6.5  --   --   ALBUMIN 3.2*  --  2.8*  --  2.6*  --   --   AST 19  --  33  --  48*  --   --   ALT 9*  --  13*  --  25  --  16  ALKPHOS 93  --  82  --  117  --   --   BILITOT 0.7  --  0.8  --  0.4  --   --   GFRNONAA 11*   < > 11* 11* 9* 11*  --   GFRAA 12*   < > 13* 13* 11* 12*  --   ANIONGAP 14   < > 8 11 9 9   --    < > = values in this interval not displayed.     Hematology Recent Labs  Lab 08/22/17 0521 08/23/17 0549 08/24/17 0525  WBC 8.0 6.6 5.3  RBC 2.97*  2.97* 2.83* 2.70*  2.70*  HGB 8.2* 7.9* 7.6*  HCT 26.1* 24.9* 24.1*  MCV 87.9 88.0 89.3  MCH 27.6 27.9 28.1  MCHC 31.4 31.7 31.5  RDW 13.5 13.6 14.0  PLT 171 169 172    Cardiac Enzymes Recent Labs  Lab 08/20/17 0838 08/21/17 1807 08/22/17 0016 08/22/17 0521  TROPONINI 0.04* 1.73* 1.48* 1.56*   No results for input(s): TROPIPOC in the last 168 hours.   BNP Recent Labs  Lab 08/20/17 0838  BNP 755.4*     DDimer No results for input(s): DDIMER in the  last 168 hours.   Radiology    Ir Cyndy Freeze Guide Cv Line Right  Result Date: 08/25/2017 INDICATION: 84 year old female with a history renal failure. EXAM: IMAGE GUIDED TEMPORARY HEMODIALYSIS CATHETER PLACEMENT MEDICATIONS: None ANESTHESIA/SEDATION: None FLUOROSCOPY TIME:  Fluoroscopy Time: 0 minutes 6 seconds (1.8 mGy). COMPLICATIONS: None PROCEDURE: Informed written consent was obtained from the patient and the patient's family after a thorough discussion of the procedural risks, benefits and alternatives. All questions were addressed. A timeout was performed prior to the initiation of the procedure. The right neck and chest was prepped with chlorhexidine, and draped in the usual sterile fashion using maximum barrier technique (cap and mask, sterile gown, sterile gloves, large sterile sheet, hand hygiene and cutaneous antiseptic). Local anesthesia was attained by infiltration with 1% lidocaine without epinephrine. Ultrasound demonstrated patency of the right internal jugular vein, and this was documented with an image. Under real-time ultrasound guidance, this vein was accessed with a 21 gauge micropuncture needle and image documentation was performed. A small dermatotomy was made at the access site with an 11 scalpel. A 0.018" wire was advanced into the SVC and the access needle exchanged for a 70F micropuncture vascular sheath. The 0.018" wire was then removed and a 0.035" wire advanced into the IVC. Upon withdrawal of the 018 wire, the wire was marked for appropriate length of the internal portion of the catheter. A 19 cm catheter was selected. Skin and subcutaneous tissues were serially dilated. Catheter was placed on the wire. The catheter tip is positioned in the upper right atrium. This was documented with a spot image. Both ports of the hemodialysis catheter were then tested for excellent function. The ports were then locked with heparinized lock. Patient tolerated the procedure well and remained  hemodynamically stable throughout. No complications were encountered and no significant blood loss was encountered. IMPRESSION: Status post right IJ temporary HD catheter placement.  Catheter ready for use. Catheter may be converted if required. Signed, Dulcy Fanny. Earleen Newport, DO [facility] Radiology Electronically Signed   By: Corrie Mckusick D.O.   On: 08/25/2017 12:10   Ir US Guide Vasc Access Right  Result Date: 08/25/2017 INDICATION: 84 year old female with a history renal failure. EXAM: IMAGE GUIDED TEMPORARY HEMODIALYSIS CATHETER PLACEMENT MEDICATIONS: None ANESTHESIA/SEDATION: None FLUOROSCOPY TIME:  Fluoroscopy Time: 0 minutes 6 seconds (1.8 mGy). COMPLICATIONS: None PROCEDURE: Informed written consent was obtained from the patient and the patient's family after a thorough discussion of the procedural risks, benefits and alternatives. All questions were addressed. A timeout was performed prior to the initiation of the procedure. The right neck and chest was prepped with chlorhexidine, and draped in the usual sterile fashion using maximum barrier technique (cap and mask, sterile gown, sterile gloves, large sterile sheet, hand hygiene and cutaneous antiseptic). Local anesthesia was attained by infiltration with 1% lidocaine without epinephrine. Ultrasound demonstrated patency of the right internal jugular vein, and this was documented with an image. Under real-time ultrasound guidance, this vein was accessed with a 21 gauge micropuncture needle and image documentation was performed. A small dermatotomy was made at the access site with an 11 scalpel. A 0.018" wire was advanced into the SVC and the access needle exchanged for a 67F micropuncture vascular sheath. The 0.018" wire was then removed and a 0.035" wire advanced into the IVC. Upon withdrawal of the 018 wire, the wire was marked for appropriate length of the internal portion of the catheter. A 19 cm catheter was  selected. Skin and subcutaneous tissues were serially dilated. Catheter was placed on the wire. The catheter tip is positioned in the upper right atrium. This was documented with a spot image. Both ports of the hemodialysis catheter were then tested for excellent function. The ports were then locked with heparinized lock. Patient tolerated the procedure well and remained hemodynamically stable throughout. No complications were encountered and no significant blood loss was encountered. IMPRESSION: Status post right IJ temporary HD catheter placement. Catheter ready for use. Catheter may be converted if required. Signed, Dulcy Fanny. Earleen Newport, DO [facility] Radiology Electronically Signed   By: Corrie Mckusick D.O.   On: 08/25/2017 12:10    Cardiac Studies    Echocardiogram 08/21/17 Study Conclusions  - Left ventricle: The cavity size was normal. There was moderate concentric hypertrophy. Systolic function was normal. The estimated ejection fraction was in the range of 60% to 65%. Wall motion was normal; there were no regional wall motion abnormalities. Features are consistent with a pseudonormal left ventricular filling pattern, with concomitant abnormal relaxation and increased filling pressure (grade 2 diastolic dysfunction). Doppler parameters are consistent with elevated ventricular end-diastolic filling pressure. - Mitral valve: Calcified annulus. Mildly thickened leaflets . There was moderate regurgitation. - Left atrium: The atrium was moderately dilated. - Right ventricle: The cavity size was mildly dilated. Wall thickness was normal. Systolic function was normal. - Tricuspid valve: There was mild regurgitation. - Pulmonary arteries: Systolic pressure was moderately increased. PA peak pressure: 47 mm Hg (S). - Inferior vena cava: The vessel was dilated. The respirophasic diameter changes were blunted (<50%),  consistent with elevated central venous pressure. - Pericardium, extracardiac: There was no pericardial effusion.   Echo 12/2016: Severe LVH, EF 65-70%, grade 3 DD, PA peak pressure 51 mmHg, mild-mod MR.   Myocardial perfusion imaging9/25/2015: Normal test, no ischemia, normal LVEF  Left cardiac cath 2012 ANGIOGRAPHIC DATA:  1. Ventriculography was performed in the RAO projection. Overall systolic  function was well preserved and no segmental abnormalities or contraction  were identified. There was near cavity obliteration. No significant  mitral regurgitation was noted.  2. There was no obvious calcification of the coronary vessels.  3. The left main coronary artery was fairly large and free of critical  disease.  4. The LAD coursed to the apex, wrapped the apex, and supplied a significant  portion of the distal inferior wall. There was a tiny first diagonal  that was free of critical disease. The proximal and mid LAD appeared to  be without significant focal narrowing. The LAD after the major diagonal  which was the second diagonal demonstrates a segmental plaque of about 50-  60%. This was previously graded at 30, but appeared to be only mildly  progressed from the previous study. There is also a diagonal stenosis  that involves likely the bifurcation and is about 50-60% as well. The  diagonal is moderate in size.  5. The circumflex provides a tiny first marginal branch that has about a 50%  area of narrowing but it is very small. There is a second large marginal  branch which bifurcates twice in its distal course. It is a large  caliber vessel and without critical disease. The AV circumflex has a  segmental area of about 60-70% right after the takeoff of a large major  diagonal branch and that is graded a 60-70%. The distal termination of  this is a smaller posterolateral branch which thus supplies a fairly  small area of myocardium.  6. The right coronary artery is a  small caliber vessel being 2 mm or less in  size. There is about a 30-40% area of plaquing in the first bend between  the proximal and mid vessel. There is probably 40% mid narrowing and in  the distal vessel some mild luminal irregularity. The terminal branches  which consist of a posterior descending and several posterolateral  branches are relatively small in caliber.  CONCLUSIONS:  1. Hypodynamic left ventricular function.  2. Scattered disease of the left anterior descending and diagonal as noted  on previous study.  3. Moderate obstruction of the AV circumflex after the large marginal  takeoff.  4. Scattered irregularities of the right coronary artery as described above.   DISPOSITION: The initial plan would be to treat the patient medically. It  is unclear which, if any, of these lesions might be causing significant  angina. We plan to check a D-dimer. We also plan to give the patient  medical therapy. She will be followed by Jeanann Lewandowsky, M.D. and either  Junious Silk, M.D. [facility] or Loretha Brasil. [name], M.D.    Patient Profile     84 y.o. female with a hx of CAD (moderate by cath 8938), Diastolic heart failure, CKD stage V, DM, HLD, HTN, GERD, breast cancer, OSA and stroke 2016who is being seen today for the evaluation of elevated troponin and diastolic heart failure  Assessment & Plan    Demand ischemia -Troponins elevated with peak 1.73 and trended down in the setting of severe LVH, known moderated CAD and CKD. -She continues to be chest pain-free and breathing has improved -For now avoiding cardiac cath due to worsening renal function.  She is having no ischemic symptoms.  She is now started dialysis considering her increased CV risk could consider diagnostic coronary angiography to see if any of her moderate coronary artery disease seen on  last catheterization has advanced.  Since she has been pain-free, it would not also be unreasonable to continue with aggressive  medical management unless she begins to have worsening anginal symptoms. -Continuing on aspirin and Plavix  Acute on chronic diastolic CHF -Echo showed Moderated LVH with normal LV systolic function, EF 48-25%, grade 2 DD, mod MR, Moderately increased pulmonary artery pressure, 47 mmHg -On medical therapy including. Hydralazine and  Imdur.  Now utilizing hemodialysis for fluid management.   Chronic kidney disease stage IV-V -Recent AV fistula placed, not ready to use - Dr. Jonnie Finner.  Temporary catheter, HD now  Hyperkalemia -Resolved  Paroxysmal atrial fibrillation -I may wait until her tunneled catheter was placed before starting warfarin.  She had 2 separate episodes of atrial fibrillation during this hospitalization. -Once warfarin initiated, can discontinue DAPT (placed previously because of stroke in 2016) due to increased bleeding risk with triple therapy.  Diabetes mellitus -Primary team  Hypertension -Blood pressure has been elevated.  Hopefully we will see reduction in blood pressure with ongoing hemodialysis and volume control.   For questions or updates, please contact [place] Please consult [email or web address] for contact info under Cardiology/STEMI.      Signed, Candee Furbish, MD  08/26/2017, 7:41 AM

## 2017-08-27 LAB — BASIC METABOLIC PANEL
Anion gap: 10 (ref 5–15)
BUN: 35 mg/dL — AB (ref 6–20)
CO2: 30 mmol/L (ref 22–32)
CREATININE: 2.79 mg/dL — AB (ref 0.44–1.00)
Calcium: 8.5 mg/dL — ABNORMAL LOW (ref 8.9–10.3)
Chloride: 96 mmol/L — ABNORMAL LOW (ref 101–111)
GFR calc Af Amer: 17 mL/min — ABNORMAL LOW (ref >60)
GFR, EST NON AFRICAN AMERICAN: 15 mL/min — AB (ref >60)
Glucose, Bld: 138 mg/dL — ABNORMAL HIGH (ref 65–99)
Potassium: 3.3 mmol/L — ABNORMAL LOW (ref 3.5–5.1)
SODIUM: 136 mmol/L (ref 135–145)

## 2017-08-27 LAB — GLUCOSE, CAPILLARY
GLUCOSE-CAPILLARY: 197 mg/dL — AB (ref 65–99)
Glucose-Capillary: 141 mg/dL — ABNORMAL HIGH (ref 65–99)
Glucose-Capillary: 171 mg/dL — ABNORMAL HIGH (ref 65–99)
Glucose-Capillary: 156 mg/dL — ABNORMAL HIGH (ref 65–99)
Glucose-Capillary: 239 mg/dL — ABNORMAL HIGH (ref 65–99)

## 2017-08-27 LAB — HEPATITIS B CORE ANTIBODY, TOTAL: HEP B C TOTAL AB: NEGATIVE

## 2017-08-27 LAB — CBC
HCT: 25.9 % — ABNORMAL LOW (ref 36.0–46.0)
Hemoglobin: 8 g/dL — ABNORMAL LOW (ref 12.0–15.0)
MCH: 27.4 pg (ref 26.0–34.0)
MCHC: 30.9 g/dL (ref 30.0–36.0)
MCV: 88.7 fL (ref 78.0–100.0)
PLATELETS: 198 10*3/uL (ref 150–400)
RBC: 2.92 MIL/uL — ABNORMAL LOW (ref 3.87–5.11)
RDW: 13.8 % (ref 11.5–15.5)
WBC: 5.2 10*3/uL (ref 4.0–10.5)

## 2017-08-27 LAB — HEPATITIS B SURFACE ANTIBODY,QUALITATIVE: Hep B S Ab: NONREACTIVE

## 2017-08-27 MED ORDER — INSULIN DETEMIR 100 UNIT/ML ~~LOC~~ SOLN
10.0000 [IU] | Freq: Every day | SUBCUTANEOUS | Status: DC
  Administered 2017-08-27 – 2017-08-31 (×5): 10 [IU] via SUBCUTANEOUS
  Filled 2017-08-27 (×6): qty 0.1

## 2017-08-27 NOTE — Progress Notes (Signed)
PROGRESS NOTE    Glenda [name]   IRS:[id number]  DOB: 1934/01/26  DOA: 08/20/2017 PCP: Angelica Pou, MD   Brief Narrative:  Glenda [name] is an 83 year old female with hypertension, diabetes mellitus 2, coronary artery disease, breast cancer, CKD 4, Chronic diastolic CHF, obstructive sleep apnea who presents to [facility] hospital for shortness of breath.  Chest x-ray revealed pulmonary edema and cardiomegaly.  She was noted to be hypoxic with pulse ox in the 70s.  She was diuresed aggressively and nephrology and cardiology were consulted.  Due to inadequate urine output nephrology has recommended transfer to [facility] for initiation of dialysis. Hospital course was complicated by an episode of atrial fibrillation, peak troponin of 1.73  Subjective: -breathing much better after starting dialysis, in good spirits    Assessment & Plan:     Progressive CKD4 with excess volume and uremia symptoms   Acute respiratory failure with hypoxia/pulmonary edema -nephrology following, poor response to diuretics -She has an AV fistula from 3/21 however not mature to use yet -s/p temporary HD catheter 5/2, started hemodialysis 5/2 -clinically improving now, volume status much better -Plan for permanent hemodialysis catheter on Monday, Plavix on hold -Diet education  Acute on chronic diastolic CHF -Volume management as above  Hyperkalemia -resolved with Kayexalate  NSTEMI vs Demand ischemia -Prior left heart cath in 2012 showed moderate coronary artery disease -Cardiology following, likely demand ischemia from excess volume -Cardiology now recommending medical management -2-D echocardiogram this admission with preserved EF and no wall motion abnormalities -Plavix on hold for procedures  PAF (paroxysmal atrial fibrillation)  - 2 brief episodes of paroxysmal atrial fibrillation noted during this hospital stay  -plan to start warfarin once procedures completed, on  Monday  AOCD - adequate iron stores -B-12 is low-normal, she received subcutaneous injection    Essential hypertension -continue Imdur, Hydralazine, Amlodipine, clonidine  -titrated down meds after HD started  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 There was moderate concentric hypertrophy. Systolic function was normal. The estimated ejection fraction was in the range of 60% to 65%. Wall motion was normal; there were no regional wall motion abnormalities. Features are consistent with a pseudonormal left ventricular filling pattern, with concomitant abnormal relaxation and increased filling pressure (grade 2 diastolic dysfunction). Doppler parameters are consistent with elevated ventricular end-diastolic filling pressure. - Mitral valve: Calcified annulus. Mildly thickened leaflets . There was moderate regurgitation. - Left atrium: The atrium was moderately dilated. - Right ventricle: The cavity size was mildly dilated. Wall thickness was normal. Systolic function was normal. - Tricuspid valve: There was mild regurgitation. - Pulmonary arteries: Systolic pressure was moderately increased. PA peak pressure: 47 mm Hg (S). - Inferior vena cava: The vessel was dilated. The respirophasic diameter changes were blunted (<50%), consistent with elevated central venous  pressure. - Pericardium, extracardiac: There was no pericardial effusion.    Antimicrobials:  Anti-infectives (From admission, onward)   None  Objective: Vitals:   08/26/17 2020 08/27/17 0249 08/27/17 0444 08/27/17 0951  BP: (!) 149/48  (!) 152/49 (!) 161/51  Pulse: 69  65 71  Resp: 16  17 16   Temp: 98.4 F (36.9 C)  98.2 F (36.8 C) 98.6 F (37 C)  TempSrc: Oral  Oral Oral  SpO2: 97%  96% 96%  Weight: 69 kg (152 lb 1.9 oz) 69 kg (152 lb 1.9 oz)    Height:        Intake/Output Summary (Last 24 hours) at 08/27/2017 1115 Last data filed at 08/27/2017 0700 Gross per 24 hour  Intake 480 ml  Output 400 ml  Net 80 ml   Filed Weights   08/26/17 0128 08/26/17 2020 08/27/17 0249  Weight: 70.5 kg (155 lb 6.8 oz) 69 kg (152 lb 1.9 oz) 69 kg (152 lb 1.9 oz)    Examination: Gen: Awake, Alert, Oriented X 3, pleasant female HEENT: PERRLA, Neck supple, no JVD Lungs: Good air movement bilaterally, CTAB CVS: RRR,No Gallops,Rubs or new Murmurs Abd: soft, Non tender, non distended, BS present Extremities: 1+ edema Skin: no new rashes inPsychiatry:  Mood & affect appropriate.     Data Reviewed: I have personally reviewed following labs and imaging studies  CBC: Recent Labs  Lab 08/20/17 1509 08/22/17 0521 08/23/17 0549 08/24/17 0525 08/27/17 0613  WBC 7.0 8.0 6.6 5.3 5.2  NEUTROABS  --  4.9  --  3.4  --   HGB 8.7* 8.2* 7.9* 7.6* 8.0*  HCT 27.6* 26.1* 24.9* 24.1* 25.9*  MCV 88.2 87.9 88.0 89.3 88.7  PLT 168 171 169 172 494   Basic Metabolic Panel: Recent Labs  Lab 08/22/17 0521 08/23/17 0549 08/24/17 0525 08/25/17 0639 08/27/17 0613  NA 137 137 137 138 136  K 4.5 5.8* 6.2* 4.3 3.3*  CL 105 105 104 101 96*  CO2 24 21* 24 28 30   GLUCOSE 44* 76 245* 204* 138*  BUN 53* 54* 59* 53* 35*  CREATININE 3.56* 3.61* 4.13* 3.69* 2.79*  CALCIUM 8.5* 8.4* 8.5* 8.3* 8.5*  MG 1.5*  --  2.0  --   --   PHOS 2.5  --  4.5  --   --    GFR: Estimated Creatinine Clearance:  14.8 mL/min (A) (by C-G formula based on SCr of 2.79 mg/dL (H)). Liver Function Tests: Recent Labs  Lab 08/22/17 0521 08/24/17 0525 08/25/17 2340  AST 33 48*  --   ALT 13* 25 16  ALKPHOS 82 117  --   BILITOT 0.8 0.4  --   PROT 6.5 6.5  --   ALBUMIN 2.8* 2.6*  --    No results for input(s): LIPASE, AMYLASE in the last 168 hours. No results for input(s): AMMONIA in the last 168 hours. Coagulation Profile: Recent Labs  Lab 08/21/17 1927  INR 1.00   Cardiac Enzymes: Recent Labs  Lab 08/21/17 1807 08/22/17 0016 08/22/17 0521  TROPONINI 1.73* 1.48* 1.56*   BNP (last 3 results) No results for input(s): PROBNP in the last 8760 hours. HbA1C: No results for input(s): HGBA1C in the last 72 hours. CBG: Recent Labs  Lab 08/26/17 1132 08/26/17 1647 08/26/17 2140 08/27/17 0206 08/27/17 0745  GLUCAP 332* 268* 265* 197* 141*   Lipid Profile: No results for input(s): CHOL, HDL, LDLCALC, TRIG, CHOLHDL, LDLDIRECT in the last 72 hours. Thyroid Function Tests: No results for input(s): TSH, T4TOTAL, FREET4, T3FREE, THYROIDAB in the last 72 hours. Anemia Panel: No results for input(s): VITAMINB12, FOLATE, FERRITIN, TIBC, IRON,  RETICCTPCT in the last 72 hours. Urine analysis:    Component Value Date/Time   COLORURINE YELLOW 08/23/2017 0824   APPEARANCEUR CLOUDY (A) 08/23/2017 0824   LABSPEC 1.009 08/23/2017 0824   PHURINE 5.0 08/23/2017 0824   GLUCOSEU NEGATIVE 08/23/2017 0824   HGBUR NEGATIVE 08/23/2017 0824   BILIRUBINUR NEGATIVE 08/23/2017 0824   KETONESUR NEGATIVE 08/23/2017 0824   PROTEINUR NEGATIVE 08/23/2017 0824   UROBILINOGEN 0.2 07/04/2014 1900   NITRITE NEGATIVE 08/23/2017 0824   LEUKOCYTESUR LARGE (A) 08/23/2017 0824   Sepsis Labs: @LABRCNTIP (procalcitonin:4,lacticidven:4) )No results found for this or any previous visit (from the past 240 hour(s)).       Radiology Studies: Ir Fluoro Guide Cv Line Right  Result Date: 08/25/2017 INDICATION: 84 year old  female with a history renal failure. EXAM: IMAGE GUIDED TEMPORARY HEMODIALYSIS CATHETER PLACEMENT MEDICATIONS: None ANESTHESIA/SEDATION: None FLUOROSCOPY TIME:  Fluoroscopy Time: 0 minutes 6 seconds (1.8 mGy). COMPLICATIONS: None PROCEDURE: Informed written consent was obtained from the patient and the patient's family after a thorough discussion of the procedural risks, benefits and alternatives. All questions were addressed. A timeout was performed prior to the initiation of the procedure. The right neck and chest was prepped with chlorhexidine, and draped in the usual sterile fashion using maximum barrier technique (cap and mask, sterile gown, sterile gloves, large sterile sheet, hand hygiene and cutaneous antiseptic). Local anesthesia was attained by infiltration with 1% lidocaine without epinephrine. Ultrasound demonstrated patency of the right internal jugular vein, and this was documented with an image. Under real-time ultrasound guidance, this vein was accessed with a 21 gauge micropuncture needle and image documentation was performed. A small dermatotomy was made at the access site with an 11 scalpel. A 0.018" wire was advanced into the SVC and the access needle exchanged for a 40F micropuncture vascular sheath. The 0.018" wire was then removed and a 0.035" wire advanced into the IVC. Upon withdrawal of the 018 wire, the wire was marked for appropriate length of the internal portion of the catheter. A 19 cm catheter was selected. Skin and subcutaneous tissues were serially dilated. Catheter was placed on the wire. The catheter tip is positioned in the upper right atrium. This was documented with a spot image. Both ports of the hemodialysis catheter were then tested for excellent function. The ports were then locked with heparinized lock. Patient tolerated the procedure well and remained hemodynamically stable throughout. No complications were encountered and no significant blood loss was encountered.  IMPRESSION: Status post right IJ temporary HD catheter placement. Catheter ready for use. Catheter may be converted if required. Signed, Dulcy Fanny. Earleen Newport, DO [facility] Radiology Electronically Signed   By: Corrie Mckusick D.O.   On: 08/25/2017 12:10   Ir US Guide Vasc Access Right  Result Date: 08/25/2017 INDICATION: 84 year old female with a history renal failure. EXAM: IMAGE GUIDED TEMPORARY HEMODIALYSIS CATHETER PLACEMENT MEDICATIONS: None ANESTHESIA/SEDATION: None FLUOROSCOPY TIME:  Fluoroscopy Time: 0 minutes 6 seconds (1.8 mGy). COMPLICATIONS: None PROCEDURE: Informed written consent was obtained from the patient and the patient's family after a thorough discussion of the procedural risks, benefits and alternatives. All questions were addressed. A timeout was performed prior to the initiation of the procedure. The right neck and chest was prepped with chlorhexidine, and draped in the usual sterile fashion using maximum barrier technique (cap and mask, sterile gown, sterile gloves, large sterile sheet, hand hygiene and cutaneous antiseptic). Local anesthesia was attained by infiltration with 1% lidocaine without epinephrine. Ultrasound demonstrated patency of  the right internal jugular vein, and this was documented with an image. Under real-time ultrasound guidance, this vein was accessed with a 21 gauge micropuncture needle and image documentation was performed. A small dermatotomy was made at the access site with an 11 scalpel. A 0.018" wire was advanced into the SVC and the access needle exchanged for a 46F micropuncture vascular sheath. The 0.018" wire was then removed and a 0.035" wire advanced into the IVC. Upon withdrawal of the 018 wire, the wire was marked for appropriate length of the internal portion of the catheter. A 19 cm catheter was selected. Skin and subcutaneous tissues were serially dilated. Catheter was placed on the wire. The catheter tip is  positioned in the upper right atrium. This was documented with a spot image. Both ports of the hemodialysis catheter were then tested for excellent function. The ports were then locked with heparinized lock. Patient tolerated the procedure well and remained hemodynamically stable throughout. No complications were encountered and no significant blood loss was encountered. IMPRESSION: Status post right IJ temporary HD catheter placement. Catheter ready for use. Catheter may be converted if required. Signed, Dulcy Fanny. Earleen Newport, DO [facility] Radiology Electronically Signed   By: Corrie Mckusick D.O.   On: 08/25/2017 12:10      Scheduled Meds: . amLODipine  10 mg Oral Daily  . aspirin EC  81 mg Oral Daily  . citalopram  20 mg Oral Daily  . cloNIDine  0.2 mg Oral BID  . feeding supplement (NEPRO CARB STEADY)  237 mL Oral BID BM  . heparin injection (subcutaneous)  5,000 Units Subcutaneous Q8H  . hydrALAZINE  25 mg Oral Q8H  . insulin aspart  0-9 Units Subcutaneous TID WC  . insulin aspart  3 Units Subcutaneous TID WC  . insulin detemir  10 Units Subcutaneous QHS  . isosorbide mononitrate  30 mg Oral Daily  . metolazone  5 mg Oral Daily  . pantoprazole  40 mg Oral Daily  . pravastatin  80 mg Oral Daily  . sodium chloride flush  3 mL Intravenous Q12H   Continuous Infusions: . sodium chloride       LOS: 7 days    Time spent in minutes: Rafael Gonzalez, MD [facility] Page via [email or web address], Password [facility] 08/27/2017, 11:15 AM

## 2017-08-27 NOTE — Progress Notes (Addendum)
[facility] Progress Note  Subjective: no new c/o   Vitals:   08/26/17 2020 08/27/17 0249 08/27/17 0444 08/27/17 0951  BP: (!) 149/48  (!) 152/49 (!) 161/51  Pulse: 69  65 71  Resp: 16  17 16   Temp: 98.4 F (36.9 C)  98.2 F (36.8 C) 98.6 F (37 C)  TempSrc: Oral  Oral Oral  SpO2: 97%  96% 96%  Weight: 69 kg (152 lb 1.9 oz) 69 kg (152 lb 1.9 oz)    Height:        Inpatient medications: . amLODipine  10 mg Oral Daily  . aspirin EC  81 mg Oral Daily  . citalopram  20 mg Oral Daily  . cloNIDine  0.2 mg Oral BID  . feeding supplement (NEPRO CARB STEADY)  237 mL Oral BID BM  . heparin injection (subcutaneous)  5,000 Units Subcutaneous Q8H  . hydrALAZINE  25 mg Oral Q8H  . insulin aspart  0-9 Units Subcutaneous TID WC  . insulin aspart  3 Units Subcutaneous TID WC  . insulin detemir  10 Units Subcutaneous QHS  . isosorbide mononitrate  30 mg Oral Daily  . metolazone  5 mg Oral Daily  . pantoprazole  40 mg Oral Daily  . pravastatin  80 mg Oral Daily  . sodium chloride flush  3 mL Intravenous Q12H   . sodium chloride     sodium chloride, acetaminophen, famotidine, heparin, ondansetron (ZOFRAN) IV, simethicone, sodium chloride flush  Exam: Gen eldarly aaf no distress up in chair nasal O2 No jvd or bruits Chest clear bilat RRR soft 2/6 sem no s3 Abd soft ntnd no mass or ascites +bs MS no joint effusions or deformity Ext 1+ leg edema bilat Neuro is alert, Ox 3 , nf  home meds:  norvasc 10/ coreg 12.5 bid/ clonidine 0.2 bid/ hydral 25 tid/ zarox 2.5 qd/   torsemide  100 bid  statin/ ppi/ imdur/ 50-50 insulin/ pepcid/ plavix/ celexa/ asa  prns   Impression: 1 ckd stage 5 - new esrd due to htn+ dm2+ cardiorenal syndrome - 3rd HD tomorrow  - started CLIP process result is still pending, family saying they would like her to go to the dialysis place closest to [facility] to [facility] in [place], have asked our Network engineer to  investigate this request, not sure if this can be successfully done (pt's home is in [place]) - consulting IR to see patient for tunneled cath on monday 2 vol excess / pulm edema - resolving 3 dm2 on insulin 4 hypertension on several bp meds 5 hx cva 6 hx diast chf    Plan - as above   Kelly Splinter MD [facility] pager [phone]   08/27/2017, 11:33 AM   Recent Labs  Lab 08/22/17 0521  08/24/17 0525 08/25/17 0639 08/27/17 0613  NA 137   < > 137 138 136  K 4.5   < > 6.2* 4.3 3.3*  CL 105   < > 104 101 96*  CO2 24   < > 24 28 30   GLUCOSE 44*   < > 245* 204* 138*  BUN 53*   < > 59* 53* 35*  CREATININE 3.56*   < > 4.13* 3.69* 2.79*  CALCIUM 8.5*   < > 8.5* 8.3* 8.5*  PHOS 2.5  --  4.5  --   --    < > = values in this interval not displayed.   Recent Labs  Lab  08/22/17 0521 08/24/17 0525 08/25/17 2340  AST 33 48*  --   ALT 13* 25 16  ALKPHOS 82 117  --   BILITOT 0.8 0.4  --   PROT 6.5 6.5  --   ALBUMIN 2.8* 2.6*  --    Recent Labs  Lab 08/22/17 0521 08/23/17 0549 08/24/17 0525 08/27/17 0613  WBC 8.0 6.6 5.3 5.2  NEUTROABS 4.9  --  3.4  --   HGB 8.2* 7.9* 7.6* 8.0*  HCT 26.1* 24.9* 24.1* 25.9*  MCV 87.9 88.0 89.3 88.7  PLT 171 169 172 198   Iron/TIBC/Ferritin/ %Sat    Component Value Date/Time   IRON 32 08/24/2017 0525   TIBC 167 (L) 08/24/2017 0525   FERRITIN 496 (H) 08/24/2017 0525   IRONPCTSAT 19 08/24/2017 0525

## 2017-08-27 NOTE — Progress Notes (Signed)
Progress Note  Patient Name: Glenda [name] Date of Encounter: 08/27/2017  Primary Cardiologist: Ena Dawley, MD   Subjective   Denies CP   NO SOB   Inpatient Medications    Scheduled Meds: . amLODipine  10 mg Oral Daily  . aspirin EC  81 mg Oral Daily  . citalopram  20 mg Oral Daily  . cloNIDine  0.2 mg Oral BID  . feeding supplement (NEPRO CARB STEADY)  237 mL Oral BID BM  . heparin injection (subcutaneous)  5,000 Units Subcutaneous Q8H  . hydrALAZINE  25 mg Oral Q8H  . insulin aspart  0-9 Units Subcutaneous TID WC  . insulin aspart  3 Units Subcutaneous TID WC  . insulin detemir  10 Units Subcutaneous QHS  . isosorbide mononitrate  30 mg Oral Daily  . metolazone  5 mg Oral Daily  . pantoprazole  40 mg Oral Daily  . pravastatin  80 mg Oral Daily  . sodium chloride flush  3 mL Intravenous Q12H   Continuous Infusions: . sodium chloride     PRN Meds: sodium chloride, acetaminophen, famotidine, heparin, ondansetron (ZOFRAN) IV, simethicone, sodium chloride flush   Vital Signs    Vitals:   08/26/17 1650 08/26/17 2020 08/27/17 0249 08/27/17 0444  BP: (!) 128/41 (!) 149/48  (!) 152/49  Pulse: 67 69  65  Resp: 18 16  17   Temp: 98.3 F (36.8 C) 98.4 F (36.9 C)  98.2 F (36.8 C)  TempSrc: Oral Oral  Oral  SpO2: 98% 97%  96%  Weight:  152 lb 1.9 oz (69 kg) 152 lb 1.9 oz (69 kg)   Height:        Intake/Output Summary (Last 24 hours) at 08/27/2017 0910 Last data filed at 08/27/2017 0700 Gross per 24 hour  Intake 480 ml  Output 400 ml  Net 80 ml   Filed Weights   08/26/17 0128 08/26/17 2020 08/27/17 0249  Weight: 155 lb 6.8 oz (70.5 kg) 152 lb 1.9 oz (69 kg) 152 lb 1.9 oz (69 kg)    Telemetry    SR with PACs  ECG      Physical Exam   GEN: Well nourished, well developed, in no acute distress   Laying in bed   HEENT: normal, temporary HD catheter Neck: no JVD Respiratory:  Clear anteirorly   MS: no deformity or atrophy  Skin: warm and dry, no  rash, left arm AV fistula Neuro: alert    Labs    Chemistry Recent Labs  Lab 08/22/17 0521  08/24/17 0525 08/25/17 0639 08/25/17 2340 08/27/17 0613  NA 137   < > 137 138  --  136  K 4.5   < > 6.2* 4.3  --  3.3*  CL 105   < > 104 101  --  96*  CO2 24   < > 24 28  --  30  GLUCOSE 44*   < > 245* 204*  --  138*  BUN 53*   < > 59* 53*  --  35*  CREATININE 3.56*   < > 4.13* 3.69*  --  2.79*  CALCIUM 8.5*   < > 8.5* 8.3*  --  8.5*  PROT 6.5  --  6.5  --   --   --   ALBUMIN 2.8*  --  2.6*  --   --   --   AST 33  --  48*  --   --   --   ALT 13*  --  25  --  16  --   ALKPHOS 82  --  117  --   --   --   BILITOT 0.8  --  0.4  --   --   --   GFRNONAA 11*   < > 9* 11*  --  15*  GFRAA 13*   < > 11* 12*  --  17*  ANIONGAP 8   < > 9 9  --  10   < > = values in this interval not displayed.     Hematology Recent Labs  Lab 08/23/17 0549 08/24/17 0525 08/27/17 0613  WBC 6.6 5.3 5.2  RBC 2.83* 2.70*  2.70* 2.92*  HGB 7.9* 7.6* 8.0*  HCT 24.9* 24.1* 25.9*  MCV 88.0 89.3 88.7  MCH 27.9 28.1 27.4  MCHC 31.7 31.5 30.9  RDW 13.6 14.0 13.8  PLT 169 172 198    Cardiac Enzymes Recent Labs  Lab 08/21/17 1807 08/22/17 0016 08/22/17 0521  TROPONINI 1.73* 1.48* 1.56*   No results for input(s): TROPIPOC in the last 168 hours.   BNP No results for input(s): BNP, PROBNP in the last 168 hours.   DDimer No results for input(s): DDIMER in the last 168 hours.   Radiology    Ir Cyndy Freeze Guide Cv Line Right  Result Date: 08/25/2017 INDICATION: 84 year old female with a history renal failure. EXAM: IMAGE GUIDED TEMPORARY HEMODIALYSIS CATHETER PLACEMENT MEDICATIONS: None ANESTHESIA/SEDATION: None FLUOROSCOPY TIME:  Fluoroscopy Time: 0 minutes 6 seconds (1.8 mGy). COMPLICATIONS: None PROCEDURE: Informed written consent was obtained from the patient and the patient's family after a thorough discussion of the procedural risks, benefits and alternatives. All questions were addressed. A timeout was  performed prior to the initiation of the procedure. The right neck and chest was prepped with chlorhexidine, and draped in the usual sterile fashion using maximum barrier technique (cap and mask, sterile gown, sterile gloves, large sterile sheet, hand hygiene and cutaneous antiseptic). Local anesthesia was attained by infiltration with 1% lidocaine without epinephrine. Ultrasound demonstrated patency of the right internal jugular vein, and this was documented with an image. Under real-time ultrasound guidance, this vein was accessed with a 21 gauge micropuncture needle and image documentation was performed. A small dermatotomy was made at the access site with an 11 scalpel. A 0.018" wire was advanced into the SVC and the access needle exchanged for a 37F micropuncture vascular sheath. The 0.018" wire was then removed and a 0.035" wire advanced into the IVC. Upon withdrawal of the 018 wire, the wire was marked for appropriate length of the internal portion of the catheter. A 19 cm catheter was selected. Skin and subcutaneous tissues were serially dilated. Catheter was placed on the wire. The catheter tip is positioned in the upper right atrium. This was documented with a spot image. Both ports of the hemodialysis catheter were then tested for excellent function. The ports were then locked with heparinized lock. Patient tolerated the procedure well and remained hemodynamically stable throughout. No complications were encountered and no significant blood loss was encountered. IMPRESSION: Status post right IJ temporary HD catheter placement. Catheter ready for use. Catheter may be converted if required. Signed, Dulcy Fanny. Earleen Newport, DO [facility] Radiology Electronically Signed   By: Corrie Mckusick D.O.   On: 08/25/2017 12:10   Ir US Guide Vasc Access Right  Result Date: 08/25/2017 INDICATION: 84 year old female with a history renal failure. EXAM: IMAGE GUIDED TEMPORARY  HEMODIALYSIS CATHETER PLACEMENT MEDICATIONS: None ANESTHESIA/SEDATION: None  FLUOROSCOPY TIME:  Fluoroscopy Time: 0 minutes 6 seconds (1.8 mGy). COMPLICATIONS: None PROCEDURE: Informed written consent was obtained from the patient and the patient's family after a thorough discussion of the procedural risks, benefits and alternatives. All questions were addressed. A timeout was performed prior to the initiation of the procedure. The right neck and chest was prepped with chlorhexidine, and draped in the usual sterile fashion using maximum barrier technique (cap and mask, sterile gown, sterile gloves, large sterile sheet, hand hygiene and cutaneous antiseptic). Local anesthesia was attained by infiltration with 1% lidocaine without epinephrine. Ultrasound demonstrated patency of the right internal jugular vein, and this was documented with an image. Under real-time ultrasound guidance, this vein was accessed with a 21 gauge micropuncture needle and image documentation was performed. A small dermatotomy was made at the access site with an 11 scalpel. A 0.018" wire was advanced into the SVC and the access needle exchanged for a 70F micropuncture vascular sheath. The 0.018" wire was then removed and a 0.035" wire advanced into the IVC. Upon withdrawal of the 018 wire, the wire was marked for appropriate length of the internal portion of the catheter. A 19 cm catheter was selected. Skin and subcutaneous tissues were serially dilated. Catheter was placed on the wire. The catheter tip is positioned in the upper right atrium. This was documented with a spot image. Both ports of the hemodialysis catheter were then tested for excellent function. The ports were then locked with heparinized lock. Patient tolerated the procedure well and remained hemodynamically stable throughout. No complications were encountered and no significant blood loss was encountered. IMPRESSION: Status post right IJ temporary HD catheter placement.  Catheter ready for use. Catheter may be converted if required. Signed, Dulcy Fanny. Earleen Newport, DO [facility] Radiology Electronically Signed   By: Corrie Mckusick D.O.   On: 08/25/2017 12:10    Cardiac Studies    Echocardiogram 08/21/17 Study Conclusions  - Left ventricle: The cavity size was normal. There was moderate concentric hypertrophy. Systolic function was normal. The estimated ejection fraction was in the range of 60% to 65%. Wall motion was normal; there were no regional wall motion abnormalities. Features are consistent with a pseudonormal left ventricular filling pattern, with concomitant abnormal relaxation and increased filling pressure (grade 2 diastolic dysfunction). Doppler parameters are consistent with elevated ventricular end-diastolic filling pressure. - Mitral valve: Calcified annulus. Mildly thickened leaflets . There was moderate regurgitation. - Left atrium: The atrium was moderately dilated. - Right ventricle: The cavity size was mildly dilated. Wall thickness was normal. Systolic function was normal. - Tricuspid valve: There was mild regurgitation. - Pulmonary arteries: Systolic pressure was moderately increased. PA peak pressure: 47 mm Hg (S). - Inferior vena cava: The vessel was dilated. The respirophasic diameter changes were blunted (<50%), consistent with elevated central venous pressure. - Pericardium, extracardiac: There was no pericardial effusion.   Echo 12/2016: Severe LVH, EF 65-70%, grade 3 DD, PA peak pressure 51 mmHg, mild-mod MR.   Myocardial perfusion imaging9/25/2015: Normal test, no ischemia, normal LVEF  Left cardiac cath 2012 ANGIOGRAPHIC DATA:  1. Ventriculography was performed in the RAO projection. Overall systolic  function was well preserved and no segmental abnormalities or contraction  were identified. There was near cavity obliteration. No  significant  mitral regurgitation was noted.  2. There was no obvious calcification of the coronary vessels.  3. The left main coronary artery was fairly large and free of critical  disease.  4. The  LAD coursed to the apex, wrapped the apex, and supplied a significant  portion of the distal inferior wall. There was a tiny first diagonal  that was free of critical disease. The proximal and mid LAD appeared to  be without significant focal narrowing. The LAD after the major diagonal  which was the second diagonal demonstrates a segmental plaque of about 50-  60%. This was previously graded at 30, but appeared to be only mildly  progressed from the previous study. There is also a diagonal stenosis  that involves likely the bifurcation and is about 50-60% as well. The  diagonal is moderate in size.  5. The circumflex provides a tiny first marginal branch that has about a 50%  area of narrowing but it is very small. There is a second large marginal  branch which bifurcates twice in its distal course. It is a large  caliber vessel and without critical disease. The AV circumflex has a  segmental area of about 60-70% right after the takeoff of a large major  diagonal branch and that is graded a 60-70%. The distal termination of  this is a smaller posterolateral branch which thus supplies a fairly  small area of myocardium.  6. The right coronary artery is a small caliber vessel being 2 mm or less in  size. There is about a 30-40% area of plaquing in the first bend between  the proximal and mid vessel. There is probably 40% mid narrowing and in  the distal vessel some mild luminal irregularity. The terminal branches  which consist of a posterior descending and several posterolateral  branches are relatively small in caliber.  CONCLUSIONS:  1. Hypodynamic left ventricular function.  2. Scattered disease of the left anterior descending and diagonal as noted  on previous study.  3. Moderate  obstruction of the AV circumflex after the large marginal  takeoff.  4. Scattered irregularities of the right coronary artery as described above.   DISPOSITION: The initial plan would be to treat the patient medically. It  is unclear which, if any, of these lesions might be causing significant  angina. We plan to check a D-dimer. We also plan to give the patient  medical therapy. She will be followed by Jeanann Lewandowsky, M.D. and either  Junious Silk, M.D. [facility] or Loretha Brasil. [name], M.D.    Patient Profile     84 y.o. female with a hx of CAD (moderate by cath 3267), Diastolic heart failure, CKD stage V, DM, HLD, HTN, GERD, breast cancer, OSA and stroke 2016who is being seen today for the evaluation of elevated troponin and diastolic heart failure  Assessment & Plan    Demand ischemia -Troponins elevated with peak 1.73 and trended down in the setting of severe LVH, known moderated CAD and CKD. Pt denies CP   No evid to support active ischemia   On ASA and plavix  Acute on chronic diastolic CHF -Echo showed Moderated LVH with normal LV systolic function, EF 12-45%, grade 2 DD, mod MR, Moderately increased pulmonary artery pressure, 47 mmHg Volume Being managed by dialysis  Note plan for second session today     Chronic kidney disease stage IV-V  Dialysis today   Paroxysmal atrial fibrillation Currently in SR   Wait until tunneld catheter placed before starting coumadin  Planned for Monday  Stop plavix after that   Diabetes mellitus -Primary team  Hypertension -Blood pressure is labile   Dialysis again today   FOllow after  For questions or updates, please contact [place] Please consult [email or web address] for contact info under Cardiology/STEMI.      Signed, Dorris Carnes, MD  08/27/2017, 9:10 AM

## 2017-08-28 LAB — GLUCOSE, CAPILLARY
GLUCOSE-CAPILLARY: 279 mg/dL — AB (ref 65–99)
GLUCOSE-CAPILLARY: 173 mg/dL — AB (ref 65–99)
Glucose-Capillary: 162 mg/dL — ABNORMAL HIGH (ref 65–99)
Glucose-Capillary: 165 mg/dL — ABNORMAL HIGH (ref 65–99)

## 2017-08-28 NOTE — Progress Notes (Signed)
PROGRESS NOTE    Laelyn Blumenthal   HCW:[id number]  DOB: 1933/08/18  DOA: 08/20/2017 PCP: Angelica Pou, MD   Brief Narrative:  Kaiyah Eber is an 84 year old female with hypertension, diabetes mellitus 2, coronary artery disease, breast cancer, CKD 4, Chronic diastolic CHF, obstructive sleep apnea who presents to [facility] for shortness of breath.  Chest x-ray revealed pulmonary edema and cardiomegaly.  She was noted to be hypoxic with pulse ox in the 70s.  She was diuresed aggressively and nephrology and cardiology were consulted.  Due to inadequate urine output nephrology has recommended transfer to [facility] for initiation of dialysis. Hospital course was complicated by an episode of atrial fibrillation, peak troponin of 1.73  Subjective: -breathing much better after starting dialysis, in good spirits    Assessment & Plan:     Progressive CKD4 with excess volume and uremia symptoms   Acute respiratory failure with hypoxia/pulmonary edema -nephrology following, poor response to diuretics -She has an AV fistula from 3/21 however not mature to use yet -s/p temporary HD catheter 5/2, started hemodialysis 5/2 -good clinical response, volume status much better -Plan for permanent hemodialysis catheter on Monday, Plavix on hold -Diet education  Acute on chronic diastolic CHF -Volume management as above  Hyperkalemia -resolved with Kayexalate  NSTEMI vs Demand ischemia -Prior left heart cath in 2012 showed moderate coronary artery disease -Cardiology following, likely demand ischemia from excess volume -Cardiology recommending medical management -2-D echocardiogram this admission with preserved EF and no wall motion abnormalities -Plavix on hold for procedures, will not restart this after Coumadin started  PAF (paroxysmal atrial fibrillation)  - 2 brief episodes of paroxysmal atrial fibrillation noted during this hospital stay  -plan to start warfarin once  procedures completed, on Monday  AOCD - adequate iron stores -B-12 is low-normal, she received subcutaneous injection    Essential hypertension -continue Imdur, Hydralazine, Amlodipine, clonidine  -titrated down meds after HD started  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 There was moderate concentric hypertrophy. Systolic function was normal. The estimated ejection fraction was in the range of 60% to 65%. Wall motion was normal; there were no regional wall motion abnormalities. Features are consistent with a pseudonormal left ventricular filling pattern, with concomitant abnormal relaxation and increased filling pressure (grade 2 diastolic dysfunction). Doppler parameters are consistent with elevated ventricular end-diastolic filling pressure. - Mitral valve: Calcified annulus. Mildly thickened leaflets . There was moderate regurgitation. - Left atrium: The atrium was moderately dilated. - Right ventricle: The cavity size was mildly dilated. Wall thickness was normal. Systolic function was normal. - Tricuspid valve: There was mild regurgitation. - Pulmonary arteries: Systolic pressure was moderately increased. PA peak pressure: 47 mm Hg (S). - Inferior vena cava: The vessel was dilated. The respirophasic diameter changes were blunted (<50%), consistent  with elevated central venous pressure. - Pericardium, extracardiac: There was no pericardial effusion.    Antimicrobials:  Anti-infectives (From admission, onward)  None       Objective: Vitals:   08/27/17 2110 08/28/17 0103 08/28/17 0526 08/28/17 1012  BP: (!) 168/52  (!) 161/44 (!) 180/56  Pulse: 71  67 69  Resp: 16  16 16   Temp: 98.7 F (37.1 C)  98.2 F (36.8 C) 98.3 F (36.8 C)  TempSrc: Oral  Oral Oral  SpO2: 98%  95% 95%  Weight:  71.3 kg (157 lb 3 oz)    Height:        Intake/Output Summary (Last 24 hours) at 08/28/2017 1022 Last data filed at 08/28/2017 0600 Gross per 24 hour  Intake 480 ml  Output 1750 ml  Net -1270 ml   Filed Weights   08/27/17 1436 08/27/17 1742 08/28/17 0103  Weight: 72.7 kg (160 lb 4.4 oz) 71.3 kg (157 lb 3 oz) 71.3 kg (157 lb 3 oz)    Examination Gen: Awake, Alert, Oriented X 3,  HEENT: PERRLA, Neck supple, no JVD Lungs: Good air movement bilaterally, CTAB CVS: RRR,No Gallops,Rubs or new Murmurs Abd: soft, Non tender, non distended, BS present Extremities: 1+ edema Skin: no new rashes Psychiatry:  Mood & affect appropriate.     Data Reviewed: I have personally reviewed following labs and imaging studies  CBC: Recent Labs  Lab 08/22/17 0521 08/23/17 0549 08/24/17 0525 08/27/17 0613  WBC 8.0 6.6 5.3 5.2  NEUTROABS 4.9  --  3.4  --   HGB 8.2* 7.9* 7.6* 8.0*  HCT 26.1* 24.9* 24.1* 25.9*  MCV 87.9 88.0 89.3 88.7  PLT 171 169 172 502   Basic Metabolic Panel: Recent Labs  Lab 08/22/17 0521 08/23/17 0549 08/24/17 0525 08/25/17 0639 08/27/17 0613  NA 137 137 137 138 136  K 4.5 5.8* 6.2* 4.3 3.3*  CL 105 105 104 101 96*  CO2 24 21* 24 28 30   GLUCOSE 44* 76 245* 204* 138*  BUN 53* 54* 59* 53* 35*  CREATININE 3.56* 3.61* 4.13* 3.69* 2.79*  CALCIUM 8.5* 8.4* 8.5* 8.3* 8.5*  MG 1.5*  --  2.0  --   --   PHOS 2.5  --  4.5  --   --    GFR: Estimated Creatinine Clearance: 16 mL/min (A) (by C-G formula based on SCr  of 2.79 mg/dL (H)). Liver Function Tests: Recent Labs  Lab 08/22/17 0521 08/24/17 0525 08/25/17 2340  AST 33 48*  --   ALT 13* 25 16  ALKPHOS 82 117  --   BILITOT 0.8 0.4  --   PROT 6.5 6.5  --   ALBUMIN 2.8* 2.6*  --    No results for input(s): LIPASE, AMYLASE in the last 168 hours. No results for input(s): AMMONIA in the last 168 hours. Coagulation Profile: Recent Labs  Lab 08/21/17 1927  INR 1.00   Cardiac Enzymes: Recent Labs  Lab 08/21/17 1807 08/22/17 0016 08/22/17 0521  TROPONINI 1.73* 1.48* 1.56*   BNP (last 3 results) No results for input(s): PROBNP in the last 8760 hours. HbA1C: No results for input(s): HGBA1C in the last 72 hours. CBG: Recent Labs  Lab 08/27/17 0745 08/27/17 1209 08/27/17 1815 08/27/17 2109 08/28/17 0744  GLUCAP 141* 171* 156* 239* 162*   Lipid Profile: No results for input(s): CHOL, HDL, LDLCALC, TRIG, CHOLHDL, LDLDIRECT in the last 72 hours. Thyroid Function Tests: No results for input(s): TSH, T4TOTAL, FREET4, T3FREE, THYROIDAB in the last 72 hours. Anemia Panel: No results for input(s): VITAMINB12, FOLATE, FERRITIN, TIBC, IRON, RETICCTPCT in the last 72 hours. Urine analysis:  Component Value Date/Time   COLORURINE YELLOW 08/23/2017 0824   APPEARANCEUR CLOUDY (A) 08/23/2017 0824   LABSPEC 1.009 08/23/2017 0824   PHURINE 5.0 08/23/2017 0824   GLUCOSEU NEGATIVE 08/23/2017 0824   HGBUR NEGATIVE 08/23/2017 0824   BILIRUBINUR NEGATIVE 08/23/2017 0824   KETONESUR NEGATIVE 08/23/2017 0824   PROTEINUR NEGATIVE 08/23/2017 0824   UROBILINOGEN 0.2 07/04/2014 1900   NITRITE NEGATIVE 08/23/2017 0824   LEUKOCYTESUR LARGE (A) 08/23/2017 0824   Sepsis Labs: @LABRCNTIP (procalcitonin:4,lacticidven:4) )No results found for this or any previous visit (from the past 240 hour(s)).       Radiology Studies: No results found.    Scheduled Meds: . amLODipine  10 mg Oral Daily  . aspirin EC  81 mg Oral Daily  . citalopram  20 mg  Oral Daily  . cloNIDine  0.2 mg Oral BID  . feeding supplement (NEPRO CARB STEADY)  237 mL Oral BID BM  . heparin injection (subcutaneous)  5,000 Units Subcutaneous Q8H  . hydrALAZINE  25 mg Oral Q8H  . insulin aspart  0-9 Units Subcutaneous TID WC  . insulin aspart  3 Units Subcutaneous TID WC  . insulin detemir  10 Units Subcutaneous QHS  . isosorbide mononitrate  30 mg Oral Daily  . metolazone  5 mg Oral Daily  . pantoprazole  40 mg Oral Daily  . pravastatin  80 mg Oral Daily  . sodium chloride flush  3 mL Intravenous Q12H   Continuous Infusions: . sodium chloride       LOS: 8 days    Time spent in minutes: [place], MD [facility] Page via [email or web address], Password [facility] 08/28/2017, 10:22 AM

## 2017-08-29 ENCOUNTER — Ambulatory Visit: Payer: Medicare (Managed Care) | Admitting: Surgery

## 2017-08-29 ENCOUNTER — Encounter (HOSPITAL_COMMUNITY): Payer: Medicare (Managed Care)

## 2017-08-29 ENCOUNTER — Inpatient Hospital Stay (HOSPITAL_COMMUNITY): Payer: Medicare (Managed Care)

## 2017-08-29 ENCOUNTER — Encounter (HOSPITAL_COMMUNITY): Payer: Self-pay | Admitting: Interventional Radiology

## 2017-08-29 HISTORY — PX: IR FLUORO GUIDE CV LINE RIGHT: IMG2283

## 2017-08-29 LAB — CBC
HCT: 32 % — ABNORMAL LOW (ref 36.0–46.0)
Hemoglobin: 9.9 g/dL — ABNORMAL LOW (ref 12.0–15.0)
MCH: 27.4 pg (ref 26.0–34.0)
MCHC: 30.9 g/dL (ref 30.0–36.0)
MCV: 88.6 fL (ref 78.0–100.0)
Platelets: 207 10*3/uL (ref 150–400)
RBC: 3.61 MIL/uL — ABNORMAL LOW (ref 3.87–5.11)
RDW: 13.8 % (ref 11.5–15.5)
WBC: 5.1 10*3/uL (ref 4.0–10.5)

## 2017-08-29 LAB — GLUCOSE, CAPILLARY
GLUCOSE-CAPILLARY: 139 mg/dL — AB (ref 65–99)
GLUCOSE-CAPILLARY: 139 mg/dL — AB (ref 65–99)
GLUCOSE-CAPILLARY: 389 mg/dL — AB (ref 65–99)
Glucose-Capillary: 134 mg/dL — ABNORMAL HIGH (ref 65–99)
Glucose-Capillary: 322 mg/dL — ABNORMAL HIGH (ref 65–99)

## 2017-08-29 LAB — PROTIME-INR
INR: 0.96
PROTHROMBIN TIME: 12.6 s (ref 11.4–15.2)

## 2017-08-29 MED ORDER — NALOXONE HCL 0.4 MG/ML IJ SOLN
INTRAMUSCULAR | Status: AC
  Filled 2017-08-29: qty 1

## 2017-08-29 MED ORDER — FLUMAZENIL 0.5 MG/5ML IV SOLN
INTRAVENOUS | Status: AC
  Filled 2017-08-29: qty 5

## 2017-08-29 MED ORDER — WARFARIN SODIUM 5 MG PO TABS
5.0000 mg | ORAL_TABLET | Freq: Once | ORAL | Status: AC
  Administered 2017-08-29: 5 mg via ORAL
  Filled 2017-08-29: qty 1

## 2017-08-29 MED ORDER — CHLORHEXIDINE GLUCONATE 4 % EX LIQD
CUTANEOUS | Status: AC
  Filled 2017-08-29: qty 15

## 2017-08-29 MED ORDER — MIDAZOLAM HCL 2 MG/2ML IJ SOLN
INTRAMUSCULAR | Status: AC | PRN
  Administered 2017-08-29 (×2): 0.5 mg via INTRAVENOUS

## 2017-08-29 MED ORDER — HEPARIN SODIUM (PORCINE) 1000 UNIT/ML IJ SOLN
INTRAMUSCULAR | Status: AC | PRN
  Administered 2017-08-29: 3200 [IU] via INTRAVENOUS

## 2017-08-29 MED ORDER — MIDAZOLAM HCL 2 MG/2ML IJ SOLN
INTRAMUSCULAR | Status: AC
  Filled 2017-08-29: qty 2

## 2017-08-29 MED ORDER — CLOPIDOGREL BISULFATE 75 MG PO TABS: 75.0000 mg | ORAL_TABLET | Freq: Every day | ORAL | Status: DC

## 2017-08-29 MED ORDER — CEFAZOLIN SODIUM-DEXTROSE 2-4 GM/100ML-% IV SOLN
INTRAVENOUS | Status: AC
  Administered 2017-08-29: 2000 mg
  Filled 2017-08-29: qty 100

## 2017-08-29 MED ORDER — LIDOCAINE HCL (PF) 1 % IJ SOLN
INTRAMUSCULAR | Status: AC | PRN
  Administered 2017-08-29: 2 mL

## 2017-08-29 MED ORDER — LIDOCAINE HCL 1 % IJ SOLN
INTRAMUSCULAR | Status: AC
  Filled 2017-08-29: qty 20

## 2017-08-29 MED ORDER — WARFARIN - PHARMACIST DOSING INPATIENT: Freq: Every day | Status: DC

## 2017-08-29 MED ORDER — HEPARIN SODIUM (PORCINE) 1000 UNIT/ML IJ SOLN
INTRAMUSCULAR | Status: AC
  Filled 2017-08-29: qty 1

## 2017-08-29 MED ORDER — CHLORHEXIDINE GLUCONATE 4 % EX LIQD
CUTANEOUS | Status: AC | PRN
  Administered 2017-08-29: 1 via TOPICAL

## 2017-08-29 MED ORDER — FENTANYL CITRATE (PF) 100 MCG/2ML IJ SOLN
INTRAMUSCULAR | Status: AC
  Filled 2017-08-29: qty 2

## 2017-08-29 MED ORDER — FENTANYL CITRATE (PF) 100 MCG/2ML IJ SOLN
INTRAMUSCULAR | Status: AC | PRN
  Administered 2017-08-29 (×2): 25 ug via INTRAVENOUS

## 2017-08-29 NOTE — Progress Notes (Signed)
[facility] ROUNDING NOTE   Subjective:   Interval History: HTN and Diabetes and new ESRD  - CLIP and continue dialysis  Decisions being made for placement   Objective:  Vital signs in last 24 hours:  Temp:  [97.8 F (36.6 C)-98.5 F (36.9 C)] 98 F (36.7 C) (05/06 0732) Pulse Rate:  [69-74] 72 (05/06 1053) Resp:  [14-20] 18 (05/06 1053) BP: (137-203)/(48-64) 181/54 (05/06 1053) SpO2:  [96 %-100 %] 97 % (05/06 1053) Weight:  [157 lb 3 oz (71.3 kg)] 157 lb 3 oz (71.3 kg) (05/06 0103)  Weight change: -3 lb 1.4 oz (-1.4 kg) Filed Weights   08/28/17 0103 08/28/17 2047 08/29/17 0103  Weight: 157 lb 3 oz (71.3 kg) 157 lb 3 oz (71.3 kg) 157 lb 3 oz (71.3 kg)    Intake/Output: I/O last 3 completed shifts: In: 15 [P.O.:960] Out: 550 [Urine:550]   Intake/Output this shift:  Total I/O In: 240 [P.O.:240] Out: 200 [Urine:200]  CVS- RRR RS- CTA ABD- BS present soft non-distended EXT- no edema   Basic Metabolic Panel: Recent Labs  Lab 08/23/17 0549 08/24/17 0525 08/25/17 0639 08/27/17 0613  NA 137 137 138 136  K 5.8* 6.2* 4.3 3.3*  CL 105 104 101 96*  CO2 21* 24 28 30   GLUCOSE 76 245* 204* 138*  BUN 54* 59* 53* 35*  CREATININE 3.61* 4.13* 3.69* 2.79*  CALCIUM 8.4* 8.5* 8.3* 8.5*  MG  --  2.0  --   --   PHOS  --  4.5  --   --     Liver Function Tests: Recent Labs  Lab 08/24/17 0525 08/25/17 2340  AST 48*  --   ALT 25 16  ALKPHOS 117  --   BILITOT 0.4  --   PROT 6.5  --   ALBUMIN 2.6*  --    No results for input(s): LIPASE, AMYLASE in the last 168 hours. No results for input(s): AMMONIA in the last 168 hours.  CBC: Recent Labs  Lab 08/23/17 0549 08/24/17 0525 08/27/17 0613 08/29/17 1043  WBC 6.6 5.3 5.2 5.1  NEUTROABS  --  3.4  --   --   HGB 7.9* 7.6* 8.0* 9.9*  HCT 24.9* 24.1* 25.9* 32.0*  MCV 88.0 89.3 88.7 88.6  PLT 169 172 198 207    Cardiac Enzymes: No results for input(s): CKTOTAL, CKMB, CKMBINDEX, TROPONINI in the last  168 hours.  BNP: Invalid input(s): POCBNP  CBG: Recent Labs  Lab 08/28/17 1216 08/28/17 1702 08/28/17 2045 08/29/17 0446 08/29/17 0734  GLUCAP 279* 165* 173* 139* 134*    Microbiology: Results for orders placed or performed during the hospital encounter of 02/28/17  MRSA PCR Screening     Status: None   Collection Time: 02/28/17  9:39 PM  Result Value Ref Range Status   MRSA by PCR NEGATIVE NEGATIVE Final    Comment:        The GeneXpert MRSA Assay (FDA approved for NASAL specimens only), is one component of a comprehensive MRSA colonization surveillance program. It is not intended to diagnose MRSA infection nor to guide or monitor treatment for MRSA infections.     Coagulation Studies: Recent Labs    08/29/17 1043  LABPROT 12.6  INR 0.96    Urinalysis: No results for input(s): COLORURINE, LABSPEC, PHURINE, GLUCOSEU, HGBUR, BILIRUBINUR, KETONESUR, PROTEINUR, UROBILINOGEN, NITRITE, LEUKOCYTESUR in the last 72 hours.  Invalid input(s): APPERANCEUR    Imaging: Ir Fluoro Guide Cv Line Right  Result Date: 08/29/2017  INDICATION: End-stage renal disease. Please convert temporary dialysis catheter to a tunneled/permanent dialysis catheter for the continuation of dialysis. EXAM: FLUOROSCOPIC GUIDED CONVERSION OF NON TUNNELED TO TUNNELED CENTRAL VENOUS HEMODIALYSIS CATHETER COMPARISON:  Ultrasound and fluoroscopic guided placement of a non tunneled temporary dialysis catheter - 08/25/2017 MEDICATIONS: Ancef 2 g IV; the antibiotic was administered with an appropriate time frame prior to the initiation of the procedure. ANESTHESIA/SEDATION: Moderate (conscious) sedation was employed during this procedure. A total of Versed 1 mg and Fentanyl 50 mcg was administered intravenously. Moderate Sedation Time: 14 minutes. The patient's level of consciousness and vital signs were monitored continuously by radiology nursing throughout the procedure under my direct supervision.  FLUOROSCOPY TIME:  36 seconds (5 mGy) COMPLICATIONS: None immediate. PROCEDURE: Informed written consent was obtained from the patient after a discussion of the risks, benefits, and alternatives to treatment. Questions regarding the procedure were encouraged and answered. The skin and external portion of the existing hemodialysis catheter was prepped with chlorhexidine in a sterile fashion, and a sterile drape was applied covering the operative field. Maximum barrier sterile technique with sterile gowns and gloves were used for the procedure. A timeout was performed prior to the initiation of the procedure. A lumen of the none tunneled temporary dialysis catheter was cannulated with a stiff Glidewire in utilized for measurement purposes. Next, the Glidewire was advanced to the level of the IVC. A 19 cm tip to cuff palindrome dialysis catheter was tunneled from a site along the anterior chest to the venotomy site. Under intermittent fluoroscopic guidance, the temporary dialysis catheter was exchanged for a peel-away sheath. The tunneled dialysis catheter was inserted into the peel-away sheath with tips open terminating within in the superior aspect the right atrium. Final catheter positioning was confirmed and documented with a spot fluoroscopic image. The catheter aspirates and flushes normally. The catheter was flushed with appropriate volume heparin dwells. The catheter exit site was secured with a 0-Prolene retention sutures. The venotomy site was apposed with an interrupted 4 0 Vicryl suture, derma bond and Steri-Strips. Both lumens were heparinized. A dressing was placed. The patient tolerated the procedure well without immediate post procedural complication. IMPRESSION: Successful fluoroscopic guided conversion of a temporary to a permanent 19 cm tip to cuff tunneled hemodialysis catheter with tips terminating within the right atrium. The catheter is ready for immediate use. Electronically Signed   By: Sandi Mariscal M.D.   On: 08/29/2017 09:35     Medications:   . sodium chloride     . amLODipine  10 mg Oral Daily  . aspirin EC  81 mg Oral Daily  . chlorhexidine      . citalopram  20 mg Oral Daily  . cloNIDine  0.2 mg Oral BID  . [START ON 08/30/2017] clopidogrel  75 mg Oral Daily  . feeding supplement (NEPRO CARB STEADY)  237 mL Oral BID BM  . fentaNYL      . heparin      . hydrALAZINE  25 mg Oral Q8H  . insulin aspart  0-9 Units Subcutaneous TID WC  . insulin aspart  3 Units Subcutaneous TID WC  . insulin detemir  10 Units Subcutaneous QHS  . isosorbide mononitrate  30 mg Oral Daily  . lidocaine      . metolazone  5 mg Oral Daily  . midazolam      . pantoprazole  40 mg Oral Daily  . pravastatin  80 mg Oral Daily  . sodium chloride flush  3 mL Intravenous Q12H   sodium chloride, acetaminophen, famotidine, ondansetron (ZOFRAN) IV, simethicone, sodium chloride flush  Assessment/ Plan:   ESRD- new start ESRD  Cardiorenal syndrome  Continue to follow   ANEMIA- Hb 9.9  MBD- controlled   HTN/VOL- controlled  ACCESS- for tunneled catheter     LOS: 9 Glenda [name] @TODAY @11 :50 AM

## 2017-08-29 NOTE — Progress Notes (Signed)
PROGRESS NOTE    Glenda [name]   JJO:[id number]  DOB: June 15, 1933  DOA: 08/20/2017 PCP: Angelica Pou, MD   Brief Narrative:  Glenda [name] is an 84 year old female with hypertension, diabetes mellitus 2, coronary artery disease, breast cancer, CKD 4, Chronic diastolic CHF, obstructive sleep apnea who presents to [facility] long hospital for shortness of breath.  Chest x-ray revealed pulmonary edema and cardiomegaly.  She was noted to be hypoxic with pulse ox in the 70s.  She was diuresed aggressively and nephrology and cardiology were consulted.  Due to inadequate urine output nephrology has recommended transfer to [facility] for initiation of dialysis. Hospital course was complicated by an episode of atrial fibrillation, peak troponin of 1.73  Subjective: -feels well, going down for tunneled HD cath    Assessment & Plan:     Progressive CKD4 with excess volume and uremia symptoms   Acute respiratory failure with hypoxia/pulmonary edema -nephrology following, poor response to diuretics -She has an AV fistula from 3/21 however not mature to use yet -s/p temporary HD catheter 5/2, started hemodialysis 5/2 -good clinical response, volume status much better -Plan for permanent hemodialysis catheter on Monday, Plavix held, does not this need this once coumadin started -Temp HD catheter changed to tunneled today 5/6 in IR, start Coumadin  Acute on chronic diastolic CHF -Volume management as above  Hyperkalemia -resolved with Kayexalate  NSTEMI vs Demand ischemia -Prior left heart cath in 2012 showed moderate coronary artery disease -Cardiology following, likely demand ischemia from excess volume -Cardiology recommending medical management -2-D echocardiogram this admission with preserved EF and no wall motion abnormalities -Plavix held for procedures, will not restart this as we are starting coumadin and no stents will  PAF (paroxysmal atrial fibrillation)  - 2 brief  episodes of paroxysmal atrial fibrillation noted during this hospital stay  -start warfarin today per pharmacy  AOCD - adequate iron stores -B-12 is low-normal, she received subcutaneous injection    Essential hypertension -continue Imdur, Hydralazine, Amlodipine, clonidine  -titrated down meds after  Will be inHD started  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 There was moderate concentric hypertrophy. Systolic function was normal. The estimated ejection fraction was in the range of 60% to 65%. Wall motion was normal; there were no regional wall motion abnormalities. Features are consistent with a pseudonormal left ventricular filling pattern, with concomitant abnormal relaxation and increased filling pressure (grade 2 diastolic dysfunction). Doppler parameters are consistent with elevated ventricular end-diastolic filling pressure. - Mitral valve: Calcified annulus. Mildly thickened leaflets . There was moderate regurgitation. - Left atrium: The atrium was moderately dilated. - Right ventricle: The cavity size was mildly dilated. Wall thickness was normal. Systolic function was normal. - Tricuspid valve: There was mild regurgitation. - Pulmonary arteries: Systolic pressure was moderately increased. PA peak pressure: 47 mm Hg (S). - Inferior vena cava: The vessel was dilated. The  respirophasic diameter changes were blunted (<50%), consistent with elevated central venous pressure. - Pericardium,  extracardiac: There was no pericardial effusion.    Antimicrobials:  Anti-infectives (From admission, onward)   Start     Dose/Rate Route Frequency Ordered Stop   08/29/17 0832  ceFAZolin (ANCEF) 2-4 GM/100ML-% IVPB    Note to Pharmacy:  Soyla Dryer   : cabinet override      08/29/17 0832 08/29/17 0854       Objective: Vitals:   08/29/17 0911 08/29/17 0916 08/29/17 0919 08/29/17 1053  BP: (!) 188/63 (!) 188/63 (!) 182/61 (!) 181/54  Pulse: 72 71 71 72  Resp: 17 14 15 18   Temp:      TempSrc:      SpO2: 100% 100% 96% 97%  Weight:      Height:        Intake/Output Summary (Last 24 hours) at 08/29/2017 1127 Last data filed at 08/29/2017 1045 Gross per 24 hour  Intake 720 ml  Output 550 ml  Net 170 ml   Filed Weights   08/28/17 0103 08/28/17 2047 08/29/17 0103  Weight: 71.3 kg (157 lb 3 oz) 71.3 kg (157 lb 3 oz) 71.3 kg (157 lb 3 oz)    Examination Gen: Awake, Alert, Oriented X 3,  HEENT: PERRLA, Neck supple, no JVD Lungs: Good air movement bilaterally, CTAB CVS: RRR,No Gallops,Rubs or new Murmurs Abd: soft, Non tender, non distended, BS present Extremities: trace edema Skin: no new rashes Psychiatry:  Mood & affect appropriate.     Data Reviewed: I have personally reviewed following labs and imaging studies  CBC: Recent Labs  Lab 08/23/17 0549 08/24/17 0525 08/27/17 0613  WBC 6.6 5.3 5.2  NEUTROABS  --  3.4  --   HGB 7.9* 7.6* 8.0*  HCT 24.9* 24.1* 25.9*  MCV 88.0 89.3 88.7  PLT 169 172 517   Basic Metabolic Panel: Recent Labs  Lab 08/23/17 0549 08/24/17 0525 08/25/17 0639 08/27/17 0613  NA 137 137 138 136  K 5.8* 6.2* 4.3 3.3*  CL 105 104 101 96*  CO2 21* 24 28 30   GLUCOSE 76 245* 204* 138*  BUN 54* 59* 53* 35*  CREATININE 3.61* 4.13* 3.69* 2.79*  CALCIUM 8.4* 8.5* 8.3* 8.5*  MG  --  2.0  --   --   PHOS  --  4.5   --   --    GFR: Estimated Creatinine Clearance: 16 mL/min (A) (by C-G formula based on SCr of 2.79 mg/dL (H)). Liver Function Tests: Recent Labs  Lab 08/24/17 0525 08/25/17 2340  AST 48*  --   ALT 25 16  ALKPHOS 117  --   BILITOT 0.4  --   PROT 6.5  --   ALBUMIN 2.6*  --    No results for input(s): LIPASE, AMYLASE in the last 168 hours. No results for input(s): AMMONIA in the last 168 hours. Coagulation Profile: No results for input(s): INR, PROTIME in the last 168 hours. Cardiac Enzymes: No results for input(s): CKTOTAL, CKMB, CKMBINDEX, TROPONINI in the last 168 hours. BNP (last 3 results) No results for input(s): PROBNP in the last 8760 hours. HbA1C: No results for input(s): HGBA1C in the last 72 hours. CBG: Recent Labs  Lab 08/28/17 1216 08/28/17 1702 08/28/17 2045 08/29/17 0446 08/29/17 0734  GLUCAP 279* 165* 173* 139* 134*   Lipid Profile: No results for input(s): CHOL, HDL, LDLCALC, TRIG, CHOLHDL, LDLDIRECT in the last 72 hours. Thyroid Function Tests: No results for input(s): TSH, T4TOTAL, FREET4, T3FREE, THYROIDAB in the last 72 hours. Anemia Panel: No results for input(s):  VITAMINB12, FOLATE, FERRITIN, TIBC, IRON, RETICCTPCT in the last 72 hours. Urine analysis:    Component Value Date/Time   COLORURINE YELLOW 08/23/2017 0824   APPEARANCEUR CLOUDY (A) 08/23/2017 0824   LABSPEC 1.009 08/23/2017 0824   PHURINE 5.0 08/23/2017 0824   GLUCOSEU NEGATIVE 08/23/2017 0824   HGBUR NEGATIVE 08/23/2017 0824   BILIRUBINUR NEGATIVE 08/23/2017 0824   KETONESUR NEGATIVE 08/23/2017 0824   PROTEINUR NEGATIVE 08/23/2017 0824   UROBILINOGEN 0.2 07/04/2014 1900   NITRITE NEGATIVE 08/23/2017 0824   LEUKOCYTESUR LARGE (A) 08/23/2017 0824   Sepsis Labs: @LABRCNTIP (procalcitonin:4,lacticidven:4) )No results found for this or any previous visit (from the past 240 hour(s)).       Radiology Studies: Ir Fluoro Guide Cv Line Right  Result Date: 08/29/2017 INDICATION:  End-stage renal disease. Please convert temporary dialysis catheter to a tunneled/permanent dialysis catheter for the continuation of dialysis. EXAM: FLUOROSCOPIC GUIDED CONVERSION OF NON TUNNELED TO TUNNELED CENTRAL VENOUS HEMODIALYSIS CATHETER COMPARISON:  Ultrasound and fluoroscopic guided placement of a non tunneled temporary dialysis catheter - 08/25/2017 MEDICATIONS: Ancef 2 g IV; the antibiotic was administered with an appropriate time frame prior to the initiation of the procedure. ANESTHESIA/SEDATION: Moderate (conscious) sedation was employed during this procedure. A total of Versed 1 mg and Fentanyl 50 mcg was administered intravenously. Moderate Sedation Time: 14 minutes. The patient's level of consciousness and vital signs were monitored continuously by radiology nursing throughout the procedure under my direct supervision. FLUOROSCOPY TIME:  36 seconds (5 mGy) COMPLICATIONS: None immediate. PROCEDURE: Informed written consent was obtained from the patient after a discussion of the risks, benefits, and alternatives to treatment. Questions regarding the procedure were encouraged and answered. The skin and external portion of the existing hemodialysis catheter was prepped with chlorhexidine in a sterile fashion, and a sterile drape was applied covering the operative field. Maximum barrier sterile technique with sterile gowns and gloves were used for the procedure. A timeout was performed prior to the initiation of the procedure. A lumen of the none tunneled temporary dialysis catheter was cannulated with a stiff Glidewire in utilized for measurement purposes. Next, the Glidewire was advanced to the level of the IVC. A 19 cm tip to cuff palindrome dialysis catheter was tunneled from a site along the anterior chest to the venotomy site. Under intermittent fluoroscopic guidance, the temporary dialysis catheter was exchanged for a peel-away sheath. The tunneled dialysis catheter was inserted into the  peel-away sheath with tips open terminating within in the superior aspect the right atrium. Final catheter positioning was confirmed and documented with a spot fluoroscopic image. The catheter aspirates and flushes normally. The catheter was flushed with appropriate volume heparin dwells. The catheter exit site was secured with a 0-Prolene retention sutures. The venotomy site was apposed with an interrupted 4 0 Vicryl suture, derma bond and Steri-Strips. Both lumens were heparinized. A dressing was placed. The patient tolerated the procedure well without immediate post procedural complication. IMPRESSION: Successful fluoroscopic guided conversion of a temporary to a permanent 19 cm tip to cuff tunneled hemodialysis catheter with tips terminating within the right atrium. The catheter is ready for immediate use. Electronically Signed   By: Sandi Mariscal M.D.   On: 08/29/2017 09:35      Scheduled Meds: . amLODipine  10 mg Oral Daily  . aspirin EC  81 mg Oral Daily  . chlorhexidine      . citalopram  20 mg Oral Daily  . cloNIDine  0.2 mg Oral BID  . [START ON 08/30/2017]  clopidogrel  75 mg Oral Daily  . feeding supplement (NEPRO CARB STEADY)  237 mL Oral BID BM  . fentaNYL      . heparin      . hydrALAZINE  25 mg Oral Q8H  . insulin aspart  0-9 Units Subcutaneous TID WC  . insulin aspart  3 Units Subcutaneous TID WC  . insulin detemir  10 Units Subcutaneous QHS  . isosorbide mononitrate  30 mg Oral Daily  . lidocaine      . metolazone  5 mg Oral Daily  . midazolam      . pantoprazole  40 mg Oral Daily  . pravastatin  80 mg Oral Daily  . sodium chloride flush  3 mL Intravenous Q12H   Continuous Infusions: . sodium chloride       LOS: 9 days a   Time spent in minutes: [place], MD [facility] Page via [email or web address], Password [facility] 08/29/2017, 11:27 AM

## 2017-08-29 NOTE — Progress Notes (Signed)
ANTICOAGULATION CONSULT NOTE - Initial Consult  Pharmacy Consult for Warfarin Indication: atrial fibrillation  No Known Allergies  Patient Measurements: Height: 5\' 6"  (167.6 cm) Weight: 157 lb 3 oz (71.3 kg) IBW/kg (Calculated) : 59.3  Vital Signs: Temp: 98 F (36.7 C) (05/06 0732) Temp Source: Oral (05/06 0732) BP: 182/61 (05/06 0919) Pulse Rate: 71 (05/06 0919)  Labs: Recent Labs    08/27/17 0613  HGB 8.0*  HCT 25.9*  PLT 198  CREATININE 2.79*    Estimated Creatinine Clearance: 16 mL/min (A) (by C-G formula based on SCr of 2.79 mg/dL (H)).   Assessment: 42 YOF with progressive CKD started on HD this admission- had temp HD catheter placed 5/2, had tunneled HD cath placed by IR today. She is found to have new onset AFib- starting warfarin now that procedures are complete. Deemed standard post-IR bleed risk- warfarin ok to start tonight, starting Plavix tomorrow morning per protocol.  Goal of Therapy:  INR 2-3 Monitor platelets by anticoagulation protocol: Yes   Plan:  Warfarin 5mg  po x1 tonight Daily INR Follow for s/s bleeding Needs education prior to discharge  Allaya Abbasi D. Susane Bey, PharmD, BCPS Clinical Pharmacist Clinical Phone for 08/29/2017 until 3:30pm: [phone] If after 3:30pm, please call main pharmacy at [phone] 08/29/2017 12:03 PM

## 2017-08-29 NOTE — Procedures (Signed)
Pre-procedure Diagnosis: ESRD Post-procedure Diagnosis: Same  Successful conversion of temporary HD catheter to tunneled HD catheter with tips terminating within the superior aspect of the right atrium.    Complications: None Immediate  EBL: Minimal   The catheter is ready for immediate use.   Ronny Bacon, MD Pager #: [phone]

## 2017-08-29 NOTE — Progress Notes (Signed)
PT Cancellation Note  Patient Details Name: Scarlette Hogston MRN: [id number] DOB: 09-27-1933   Cancelled Treatment:    Reason Eval/Treat Not Completed: Patient at procedure or test/unavailable. Pt off the floor for placement of permanent HD catheter. PT to re-attempt Rx as time allows.   Lorriane Shire 08/29/2017, 11:28 AM

## 2017-08-30 LAB — PROTIME-INR
INR: 1.02
PROTHROMBIN TIME: 13.3 s (ref 11.4–15.2)

## 2017-08-30 LAB — GLUCOSE, CAPILLARY
GLUCOSE-CAPILLARY: 207 mg/dL — AB (ref 65–99)
GLUCOSE-CAPILLARY: 158 mg/dL — AB (ref 65–99)
Glucose-Capillary: 195 mg/dL — ABNORMAL HIGH (ref 65–99)
Glucose-Capillary: 273 mg/dL — ABNORMAL HIGH (ref 65–99)

## 2017-08-30 MED ORDER — COUMADIN BOOK
Freq: Once | Status: AC
  Administered 2017-08-30: 18:00:00
  Filled 2017-08-30: qty 1

## 2017-08-30 MED ORDER — POLYETHYLENE GLYCOL 3350 17 G PO PACK
17.0000 g | PACK | Freq: Every day | ORAL | Status: DC
  Administered 2017-08-30 – 2017-09-01 (×3): 17 g via ORAL
  Filled 2017-08-30 (×3): qty 1

## 2017-08-30 MED ORDER — RENA-VITE PO TABS
1.0000 | ORAL_TABLET | Freq: Every day | ORAL | Status: DC
  Administered 2017-08-30 – 2017-08-31 (×2): 1 via ORAL
  Filled 2017-08-30 (×2): qty 1

## 2017-08-30 MED ORDER — WARFARIN SODIUM 5 MG PO TABS
5.0000 mg | ORAL_TABLET | Freq: Once | ORAL | Status: AC
  Administered 2017-08-30: 5 mg via ORAL
  Filled 2017-08-30: qty 1

## 2017-08-30 MED ORDER — METOPROLOL SUCCINATE ER 25 MG PO TB24
25.0000 mg | ORAL_TABLET | Freq: Every day | ORAL | Status: DC
  Administered 2017-08-30 – 2017-09-01 (×3): 25 mg via ORAL
  Filled 2017-08-30 (×3): qty 1

## 2017-08-30 MED ORDER — ENOXAPARIN SODIUM 60 MG/0.6ML ~~LOC~~ SOLN
60.0000 mg | SUBCUTANEOUS | Status: DC
  Administered 2017-08-30 – 2017-09-01 (×3): 60 mg via SUBCUTANEOUS
  Filled 2017-08-30 (×3): qty 0.6

## 2017-08-30 NOTE — Progress Notes (Signed)
PROGRESS NOTE    Glenda [name]   HMC:[id number]  DOB: 1933-06-05  DOA: 08/20/2017 PCP: Angelica Pou, MD   Brief Narrative:  Glenda [name] is an 84 year old female with hypertension, diabetes mellitus 2, coronary artery disease, breast cancer, CKD 4, AVF, Chronic diastolic CHF, obstructive sleep apnea presented to [facility] long hospital for shortness of breath.   - admitted with with hypoxia, pulmonary edema, NSTEMI -She was diuresed aggressively and nephrology and cardiology were consulted, started Plavix too  Due to inadequate urine output nephrology has recommended transfer to [facility] for initiation of dialysis, hospital course was complicated by an episode of atrial fibrillation, peak troponin of 1.73 -improving after starting HD -initially had a temporary HD catheter due to Plavix use, this was changed over to a permanent tunneled hemodialysis catheter 5/6 -Started Coumadin, post procedures 5/6 -Awaiting CLIp for HD  Subjective: -feels well, going down for tunneled HD cath    Assessment & Plan:     Progressive CKD4 with excess volume and uremia symptoms   Acute respiratory failure with hypoxia/pulmonary edema -nephrology following, poor response to diuretics -She has a prior AV fistula from 3/21 however not mature to use yet -s/p temporary HD catheter 5/2, started hemodialysis 5/2 -good clinical response, volume status much better, hypoxia resolved -Then had Tunneled HD catheter placed yesterday 5/6 -Plavix has now been discontinued, Coumadin started 5/6 -awaiting CLIP for HD  Acute on chronic diastolic CHF -Volume management as above with HD  Hyperkalemia -resolved with Kayexalate  NSTEMI vs Demand ischemia -Prior left heart cath in 2012 showed moderate coronary artery disease -Cardiology following, likely demand ischemia from excess volume -Cardiology recommending medical management, ASA/plavix/BB -2-D echocardiogram this admission with preserved EF and  no wall motion abnormalities -Plavix held for procedures, will not restart this as we are starting coumadin and no stents will  PAF (paroxysmal atrial fibrillation)  - 2 brief episodes of paroxysmal atrial fibrillation noted during this hospital stay  -started warfarin 5/6 post procedures per pharmacy -since she will need only few days of bridging and almost ready for DC awaiting CLIP will use Lovenox per Pharmacy, daughter comfortable giving pt lovenox shots for few days until INR therapeutic -CM called PCP's office at [facility] for INR check in 3days  AOCD - adequate iron stores -B-12 is low-normal, she received subcutaneous injection    Essential hypertension -continue Imdur, Hydralazine, clonidine, BB -titrated down some meds  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 There was moderate concentric hypertrophy. Systolic function was normal. The estimated ejection fraction was in the range of 60% to 65%. Wall motion was normal; there were no regional wall motion abnormalities. Features are consistent with a pseudonormal left ventricular filling pattern, with concomitant abnormal relaxation and increased filling pressure (grade 2 diastolic dysfunction). Doppler parameters are consistent with elevated ventricular end-diastolic filling pressure. - Mitral valve: Calcified annulus. Mildly thickened leaflets  . There was moderate regurgitation. - Left atrium: The atrium was moderately  dilated. - Right ventricle: The cavity size was mildly dilated. Wall thickness was normal. Systolic function was normal. - Tricuspid valve: There was mild regurgitation. - Pulmonary arteries: Systolic pressure was moderately increased. PA peak pressure: 47 mm Hg (S). - Inferior vena cava: The vessel was dilated. The respirophasic diameter changes were blunted (<50%), consistent with elevated central venous pressure. - Pericardium, extracardiac: There was no pericardial effusion.    Antimicrobials:  Anti-infectives (From admission, onward)   Start     Dose/Rate Route Frequency Ordered Stop   08/29/17 0832  ceFAZolin (ANCEF) 2-4 GM/100ML-% IVPB    Note to Pharmacy:  Soyla Dryer   : cabinet override      08/29/17 0832 08/29/17 0854       Objective: Vitals:   08/29/17 2330 08/30/17 0418 08/30/17 0500 08/30/17 0759  BP: 140/67 (!) 176/74  (!) 159/51  Pulse: 75 70  71  Resp:  14  16  Temp:  98.6 F (37 C)  98.7 F (37.1 C)  TempSrc:  Oral  Oral  SpO2:  100%  96%  Weight:   66 kg (145 lb 8.1 oz)   Height:        Intake/Output Summary (Last 24 hours) at 08/30/2017 1236 Last data filed at 08/30/2017 0955 Gross per 24 hour  Intake 600 ml  Output 2000 ml  Net -1400 ml   Filed Weights   08/29/17 1504 08/29/17 2052 08/30/17 0500  Weight: 67.5 kg (148 lb 13 oz) 66 kg (145 lb 8.1 oz) 66 kg (145 lb 8.1 oz)    Examination Gen: Awake, Alert, Oriented X 3, elderly female, in good spirits HEENT: PERRLA, Neck supple, no JVD, temporary HD catheter Lungs: Good air movement bilaterally, CTAB CVS: RRR,No Gallops,Rubs or new Murmurs Abd: soft, Non tender, non distended, BS present Extremities: trace edema Skin: no new rashes Psychiatry:  Mood & affect appropriate.     Data Reviewed: I have personally reviewed following labs and imaging studies  CBC: Recent Labs  Lab 08/24/17 0525  08/27/17 0613 08/29/17 1043  WBC 5.3 5.2 5.1  NEUTROABS 3.4  --   --   HGB 7.6* 8.0* 9.9*  HCT 24.1* 25.9* 32.0*  MCV 89.3 88.7 88.6  PLT 172 198 532   Basic Metabolic Panel: Recent Labs  Lab 08/24/17 0525 08/25/17 0639 08/27/17 0613  NA 137 138 136  K 6.2* 4.3 3.3*  CL 104 101 96*  CO2 24 28 30   GLUCOSE 245* 204* 138*  BUN 59* 53* 35*  CREATININE 4.13* 3.69* 2.79*  CALCIUM 8.5* 8.3* 8.5*  MG 2.0  --   --   PHOS 4.5  --   --    GFR: Estimated Creatinine Clearance: 14.8 mL/min (A) (by C-G formula based on SCr of 2.79 mg/dL (H)). Liver Function Tests: Recent Labs  Lab 08/24/17 0525 08/25/17 2340  AST 48*  --   ALT 25 16  ALKPHOS 117  --   BILITOT 0.4  --   PROT 6.5  --   ALBUMIN 2.6*  --    No results for input(s): LIPASE, AMYLASE in the last 168 hours. No results for input(s): AMMONIA in the last 168 hours. Coagulation Profile: Recent Labs  Lab 08/29/17 1043 08/30/17 0718  INR 0.96 1.02   Cardiac Enzymes: No results for input(s): CKTOTAL, CKMB, CKMBINDEX, TROPONINI in the last 168 hours. BNP (last 3 results) No results for input(s): PROBNP in the last 8760 hours. HbA1C: No results for input(s): HGBA1C in the last  72 hours. CBG: Recent Labs  Lab 08/29/17 1638 08/29/17 2055 08/29/17 2330 08/30/17 0745 08/30/17 1135  GLUCAP 139* 389* 322* 195* 273*   Lipid Profile: No results for input(s): CHOL, HDL, LDLCALC, TRIG, CHOLHDL, LDLDIRECT in the last 72 hours. Thyroid Function Tests: No results for input(s): TSH, T4TOTAL, FREET4, T3FREE, THYROIDAB in the last 72 hours. Anemia Panel: No results for input(s): VITAMINB12, FOLATE, FERRITIN, TIBC, IRON, RETICCTPCT in the last 72 hours. Urine analysis:    Component Value Date/Time   COLORURINE YELLOW 08/23/2017 0824   APPEARANCEUR CLOUDY (A) 08/23/2017 0824   LABSPEC 1.009 08/23/2017 0824   PHURINE 5.0 08/23/2017 0824   GLUCOSEU NEGATIVE 08/23/2017 0824   HGBUR NEGATIVE 08/23/2017 0824   BILIRUBINUR  NEGATIVE 08/23/2017 0824   KETONESUR NEGATIVE 08/23/2017 0824   PROTEINUR NEGATIVE 08/23/2017 0824   UROBILINOGEN 0.2 07/04/2014 1900   NITRITE NEGATIVE 08/23/2017 0824   LEUKOCYTESUR LARGE (A) 08/23/2017 0824   Sepsis Labs: @LABRCNTIP (procalcitonin:4,lacticidven:4) )No results found for this or any previous visit (from the past 240 hour(s)).       Radiology Studies: Ir Fluoro Guide Cv Line Right  Result Date: 08/29/2017 INDICATION: End-stage renal disease. Please convert temporary dialysis catheter to a tunneled/permanent dialysis catheter for the continuation of dialysis. EXAM: FLUOROSCOPIC GUIDED CONVERSION OF NON TUNNELED TO TUNNELED CENTRAL VENOUS HEMODIALYSIS CATHETER COMPARISON:  Ultrasound and fluoroscopic guided placement of a non tunneled temporary dialysis catheter - 08/25/2017 MEDICATIONS: Ancef 2 g IV; the antibiotic was administered with an appropriate time frame prior to the initiation of the procedure. ANESTHESIA/SEDATION: Moderate (conscious) sedation was employed during this procedure. A total of Versed 1 mg and Fentanyl 50 mcg was administered intravenously. Moderate Sedation Time: 14 minutes. The patient's level of consciousness and vital signs were monitored continuously by radiology nursing throughout the procedure under my direct supervision. FLUOROSCOPY TIME:  36 seconds (5 mGy) COMPLICATIONS: None immediate. PROCEDURE: Informed written consent was obtained from the patient after a discussion of the risks, benefits, and alternatives to treatment. Questions regarding the procedure were encouraged and answered. The skin and external portion of the existing hemodialysis catheter was prepped with chlorhexidine in a sterile fashion, and a sterile drape was applied covering the operative field. Maximum barrier sterile technique with sterile gowns and gloves were used for the procedure. A timeout was performed prior to the initiation of the procedure. A lumen of the none tunneled  temporary dialysis catheter was cannulated with a stiff Glidewire in utilized for measurement purposes. Next, the Glidewire was advanced to the level of the IVC. A 19 cm tip to cuff palindrome dialysis catheter was tunneled from a site along the anterior chest to the venotomy site. Under intermittent fluoroscopic guidance, the temporary dialysis catheter was exchanged for a peel-away sheath. The tunneled dialysis catheter was inserted into the peel-away sheath with tips open terminating within in the superior aspect the right atrium. Final catheter positioning was confirmed and documented with a spot fluoroscopic image. The catheter aspirates and flushes normally. The catheter was flushed with appropriate volume heparin dwells. The catheter exit site was secured with a 0-Prolene retention sutures. The venotomy site was apposed with an interrupted 4 0 Vicryl suture, derma bond and Steri-Strips. Both lumens were heparinized. A dressing was placed. The patient tolerated the procedure well without immediate post procedural complication. IMPRESSION: Successful fluoroscopic guided conversion of a temporary to a permanent 19 cm tip to cuff tunneled hemodialysis catheter with tips terminating within the right atrium. The catheter is ready for immediate  use. Electronically Signed   By: Sandi Mariscal M.D.   On: 08/29/2017 09:35      Scheduled Meds: . amLODipine  10 mg Oral Daily  . aspirin EC  81 mg Oral Daily  . citalopram  20 mg Oral Daily  . cloNIDine  0.2 mg Oral BID  . coumadin book   Does not apply Once  . enoxaparin (LOVENOX) injection  60 mg Subcutaneous Q24H  . feeding supplement (NEPRO CARB STEADY)  237 mL Oral BID BM  . hydrALAZINE  25 mg Oral Q8H  . insulin aspart  0-9 Units Subcutaneous TID WC  . insulin aspart  3 Units Subcutaneous TID WC  . insulin detemir  10 Units Subcutaneous QHS  . isosorbide mononitrate  30 mg Oral Daily  . pantoprazole  40 mg Oral Daily  . pravastatin  80 mg Oral Daily   . sodium chloride flush  3 mL Intravenous Q12H  . warfarin  5 mg Oral ONCE-1800  . Warfarin - Pharmacist Dosing Inpatient   Does not apply q1800   Continuous Infusions: . sodium chloride       LOS: 10 days    Time spent in minutes: [place], MD [facility] Page via [email or web address] [facility] 08/30/2017, 12:36 PM

## 2017-08-30 NOTE — Plan of Care (Signed)
Nutrition Education Note  RD consulted for Renal Education. Provided handouts from the [employer] (Dietary Guidelines for Adults Starting on Dialysis, Potassium and your CKD diet, Phosphorus and your CKD diet) to patient and family. Reviewed food groups and provided recommendations specifically determined for patient's current nutritional status.   Explained why diet restrictions are needed and provided lists of foods to limit/avoid that are high potassium, sodium, and phosphorus. Provided specific recommendations on safer alternatives of these foods. Strongly encouraged compliance of this diet.   Discussed renal MVI, importance of taking this supplement at night (after HD) and taking daily  Discussed importance of protein intake at each meal and snack. Provided examples of how to maximize protein intake throughout the day. Discussed need for fluid restriction with dialysis, importance of minimizing weight gain between HD treatments, and renal-friendly beverage options.  Encouraged pt to discuss specific diet questions/concerns with RD at HD outpatient facility. Teach back method used.  Expect good compliance.  Body mass index is 23.48 kg/m.   Current diet order is Renal Carb Modified patient is consuming approximately 50-100% of meals at this time. Labs and medications reviewed. No further nutrition interventions warranted at this time. RD contact information provided. If additional nutrition issues arise, please re-consult RD.  Kerman Passey MS, RD, [place], [place] [phone] Pager  [phone] Weekend/On-Call Pager

## 2017-08-30 NOTE — Progress Notes (Addendum)
[facility] for Warfarin Indication: atrial fibrillation  No Known Allergies  Patient Measurements: Height: 5\' 6"  (167.6 cm) Weight: 145 lb 8.1 oz (66 kg) IBW/kg (Calculated) : 59.3  Vital Signs: Temp: 98.7 F (37.1 C) (05/07 0759) Temp Source: Oral (05/07 0759) BP: 159/51 (05/07 0759) Pulse Rate: 71 (05/07 0759)  Labs: Recent Labs    08/29/17 1043 08/30/17 0718  HGB 9.9*  --   HCT 32.0*  --   PLT 207  --   LABPROT 12.6 13.3  INR 0.96 1.02    Estimated Creatinine Clearance: 14.8 mL/min (A) (by C-G formula based on SCr of 2.79 mg/dL (H)).   Assessment: 45 YOF with progressive CKD started on HD this admission- had temp HD catheter placed 5/2, had tunneled HD cath placed by IR 5/6. She is found to have new onset AFib- starting warfarin now that procedures are complete.  INR 1.02. No bleeding noted  Goal of Therapy:  INR 2-3 Monitor platelets by anticoagulation protocol: Yes   Plan:  Recommend warfarin 5mg  daily with an INR check no later than Friday 5/10 (to be done at [facility]) To d/c on Lovenox bridge- 60mg  subQ q24h. This can be stopped when INR >2 Daily INR while here  Follow for s/s bleeding Needs education prior to discharge  Sadi Arave D. Deitrich Steve, PharmD, BCPS Clinical Pharmacist Clinical Phone for 08/30/2017 until 3:30pm: [phone] If after 3:30pm, please call main pharmacy at [phone] 08/30/2017 10:16 AM

## 2017-08-30 NOTE — Progress Notes (Signed)
[facility] ROUNDING NOTE   Subjective:   Interval History: HTN and Diabetes and new ESRD  - CLIP and continue dialysis  Decisions being made for placement   Objective:  Vital signs in last 24 hours:  Temp:  [98 F (36.7 C)-98.8 F (37.1 C)] 98.7 F (37.1 C) (05/07 0759) Pulse Rate:  [70-79] 71 (05/07 0759) Resp:  [14-20] 16 (05/07 0759) BP: (140-195)/(50-74) 159/51 (05/07 0759) SpO2:  [96 %-100 %] 96 % (05/07 0759) Weight:  [145 lb 8.1 oz (66 kg)-155 lb 10.3 oz (70.6 kg)] 145 lb 8.1 oz (66 kg) (05/07 0500)  Weight change: -1 lb 8.7 oz (-0.7 kg) Filed Weights   08/29/17 1504 08/29/17 2052 08/30/17 0500  Weight: 148 lb 13 oz (67.5 kg) 145 lb 8.1 oz (66 kg) 145 lb 8.1 oz (66 kg)    Intake/Output: I/O last 3 completed shifts: In: 840 [P.O.:840] Out: 2550 [Urine:550; Other:2000]   Intake/Output this shift:  Total I/O In: 240 [P.O.:240] Out: -   CVS- RRR RS- CTA ABD- BS present soft non-distended EXT- no edema   Basic Metabolic Panel: Recent Labs  Lab 08/24/17 0525 08/25/17 0639 08/27/17 0613  NA 137 138 136  K 6.2* 4.3 3.3*  CL 104 101 96*  CO2 24 28 30   GLUCOSE 245* 204* 138*  BUN 59* 53* 35*  CREATININE 4.13* 3.69* 2.79*  CALCIUM 8.5* 8.3* 8.5*  MG 2.0  --   --   PHOS 4.5  --   --     Liver Function Tests: Recent Labs  Lab 08/24/17 0525 08/25/17 2340  AST 48*  --   ALT 25 16  ALKPHOS 117  --   BILITOT 0.4  --   PROT 6.5  --   ALBUMIN 2.6*  --    No results for input(s): LIPASE, AMYLASE in the last 168 hours. No results for input(s): AMMONIA in the last 168 hours.  CBC: Recent Labs  Lab 08/24/17 0525 08/27/17 0613 08/29/17 1043  WBC 5.3 5.2 5.1  NEUTROABS 3.4  --   --   HGB 7.6* 8.0* 9.9*  HCT 24.1* 25.9* 32.0*  MCV 89.3 88.7 88.6  PLT 172 198 207    Cardiac Enzymes: No results for input(s): CKTOTAL, CKMB, CKMBINDEX, TROPONINI in the last 168 hours.  BNP: Invalid input(s): POCBNP  CBG: Recent Labs  Lab  08/29/17 0734 08/29/17 1638 08/29/17 2055 08/29/17 2330 08/30/17 0745  GLUCAP 134* 139* 389* 322* 195*    Microbiology: Results for orders placed or performed during the hospital encounter of 02/28/17  MRSA PCR Screening     Status: None   Collection Time: 02/28/17  9:39 PM  Result Value Ref Range Status   MRSA by PCR NEGATIVE NEGATIVE Final    Comment:        The GeneXpert MRSA Assay (FDA approved for NASAL specimens only), is one component of a comprehensive MRSA colonization surveillance program. It is not intended to diagnose MRSA infection nor to guide or monitor treatment for MRSA infections.     Coagulation Studies: Recent Labs    08/29/17 1043 08/30/17 0718  LABPROT 12.6 13.3  INR 0.96 1.02    Urinalysis: No results for input(s): COLORURINE, LABSPEC, PHURINE, GLUCOSEU, HGBUR, BILIRUBINUR, KETONESUR, PROTEINUR, UROBILINOGEN, NITRITE, LEUKOCYTESUR in the last 72 hours.  Invalid input(s): APPERANCEUR    Imaging: Ir Fluoro Guide Cv Line Right  Result Date: 08/29/2017 INDICATION: End-stage renal disease. Please convert temporary dialysis catheter to a tunneled/permanent dialysis catheter for the  continuation of dialysis. EXAM: FLUOROSCOPIC GUIDED CONVERSION OF NON TUNNELED TO TUNNELED CENTRAL VENOUS HEMODIALYSIS CATHETER COMPARISON:  Ultrasound and fluoroscopic guided placement of a non tunneled temporary dialysis catheter - 08/25/2017 MEDICATIONS: Ancef 2 g IV; the antibiotic was administered with an appropriate time frame prior to the initiation of the procedure. ANESTHESIA/SEDATION: Moderate (conscious) sedation was employed during this procedure. A total of Versed 1 mg and Fentanyl 50 mcg was administered intravenously. Moderate Sedation Time: 14 minutes. The patient's level of consciousness and vital signs were monitored continuously by radiology nursing throughout the procedure under my direct supervision. FLUOROSCOPY TIME:  36 seconds (5 mGy) COMPLICATIONS: None  immediate. PROCEDURE: Informed written consent was obtained from the patient after a discussion of the risks, benefits, and alternatives to treatment. Questions regarding the procedure were encouraged and answered. The skin and external portion of the existing hemodialysis catheter was prepped with chlorhexidine in a sterile fashion, and a sterile drape was applied covering the operative field. Maximum barrier sterile technique with sterile gowns and gloves were used for the procedure. A timeout was performed prior to the initiation of the procedure. A lumen of the none tunneled temporary dialysis catheter was cannulated with a stiff Glidewire in utilized for measurement purposes. Next, the Glidewire was advanced to the level of the IVC. A 19 cm tip to cuff palindrome dialysis catheter was tunneled from a site along the anterior chest to the venotomy site. Under intermittent fluoroscopic guidance, the temporary dialysis catheter was exchanged for a peel-away sheath. The tunneled dialysis catheter was inserted into the peel-away sheath with tips open terminating within in the superior aspect the right atrium. Final catheter positioning was confirmed and documented with a spot fluoroscopic image. The catheter aspirates and flushes normally. The catheter was flushed with appropriate volume heparin dwells. The catheter exit site was secured with a 0-Prolene retention sutures. The venotomy site was apposed with an interrupted 4 0 Vicryl suture, derma bond and Steri-Strips. Both lumens were heparinized. A dressing was placed. The patient tolerated the procedure well without immediate post procedural complication. IMPRESSION: Successful fluoroscopic guided conversion of a temporary to a permanent 19 cm tip to cuff tunneled hemodialysis catheter with tips terminating within the right atrium. The catheter is ready for immediate use. Electronically Signed   By: Sandi Mariscal M.D.   On: 08/29/2017 09:35     Medications:    . sodium chloride     . amLODipine  10 mg Oral Daily  . aspirin EC  81 mg Oral Daily  . citalopram  20 mg Oral Daily  . cloNIDine  0.2 mg Oral BID  . coumadin book   Does not apply Once  . enoxaparin (LOVENOX) injection  60 mg Subcutaneous Q24H  . feeding supplement (NEPRO CARB STEADY)  237 mL Oral BID BM  . hydrALAZINE  25 mg Oral Q8H  . insulin aspart  0-9 Units Subcutaneous TID WC  . insulin aspart  3 Units Subcutaneous TID WC  . insulin detemir  10 Units Subcutaneous QHS  . isosorbide mononitrate  30 mg Oral Daily  . pantoprazole  40 mg Oral Daily  . pravastatin  80 mg Oral Daily  . sodium chloride flush  3 mL Intravenous Q12H  . warfarin  5 mg Oral ONCE-1800  . Warfarin - Pharmacist Dosing Inpatient   Does not apply q1800   sodium chloride, acetaminophen, famotidine, ondansetron (ZOFRAN) IV, simethicone, sodium chloride flush  Assessment/ Plan:   ESRD- new start ESRD  Cardiorenal syndrome  Continue to follow   ANEMIA- Hb 9.9  MBD- controlled   HTN/VOL- controlled  ACCESS- for tunneled catheter   Patient awaiting for CLIP placement at this time   LOS: 10 Glenda [name] W @TODAY @10 :27 AM

## 2017-08-30 NOTE — Progress Notes (Signed)
Physical Therapy Treatment Patient Details Name: Glenda [name] MRN: [id number] DOB: 28-Sep-1933 Today's Date: 08/30/2017    History of Present Illness 84 y.o. female with medical history significant of grade 3 diastolic HF presenting with 1 day complaint of worsening sob. PMH of CVA with R HP, DM, HTN.    PT Comments    Continuing work on functional mobility and activity tolerance;  Walked well on [employer] today and O2 sats remained greater tahn or equal to 98%; She was pleased with her walk; Noted CLIP process started -- recommend she go to HD in the recliner   Follow Up Recommendations  No PT follow up; Plans to keep with [facility] exercise classes     Equipment Recommendations  None recommended by PT(plans to exercise 3x/week at exercise program; declines HHPT)    Recommendations for Other Services       Precautions / Restrictions Precautions Precautions: Fall Precaution Comments: pt denies falls in past 1 year,  has AFO at home but hasn't needed it recently    Mobility  Bed Mobility               General bed mobility comments: OOB in recliner  Transfers Overall transfer level: Needs assistance Equipment used: Rolling walker (2 wheeled) Transfers: Sit to/from Stand Sit to Stand: Min guard(without physical contact)         General transfer comment: Cues for hand placement and safety  Ambulation/Gait Ambulation/Gait assistance: Min guard Ambulation Distance (Feet): 75 Feet Assistive device: Rolling walker (2 wheeled) Gait Pattern/deviations: Step-through pattern;Decreased stride length;Decreased dorsiflexion - right;Steppage Gait velocity: decr   General Gait Details: Walked approx 75 ft without seated rest break and on Room Air; noted gait deviations related to stroke as noted in previous notes; she was pleased with her progress   Chief Strategy Officer    Modified Rankin (Stroke Patients Only)       Balance     Sitting  balance-Leahy Scale: Good       Standing balance-Leahy Scale: Fair                              Cognition Arousal/Alertness: Awake/alert Behavior During Therapy: WFL for tasks assessed/performed Overall Cognitive Status: Within Functional Limits for tasks assessed                                        Exercises      General Comments General comments (skin integrity, edema, etc.): Walked on Room Air and O2 sats reamined greater than or equal to 98%       Pertinent Vitals/Pain Pain Assessment: No/denies pain    Home Living                      Prior Function            PT Goals (current goals can now be found in the care plan section) Acute Rehab PT Goals Patient Stated Goal: likes to go to church and to [facility] (adult day program) PT Goal Formulation: With family Time For Goal Achievement: 09/06/17 Potential to Achieve Goals: Good Progress towards PT goals: Progressing toward goals    Frequency    Min 3X/week      PT Plan Current plan remains appropriate  Co-evaluation              AM-PAC PT "6 Clicks" Daily Activity  Outcome Measure  Difficulty turning over in bed (including adjusting bedclothes, sheets and blankets)?: A Little Difficulty moving from lying on back to sitting on the side of the bed? : A Little Difficulty sitting down on and standing up from a chair with arms (e.g., wheelchair, bedside commode, etc,.)?: A Little Help needed moving to and from a bed to chair (including a wheelchair)?: None Help needed walking in hospital room?: None Help needed climbing 3-5 steps with a railing? : A Little 6 Click Score: 20    End of Session Equipment Utilized During Treatment: Gait belt Activity Tolerance: Patient tolerated treatment well Patient left: in chair;with call bell/phone within reach Nurse Communication: Mobility status PT Visit Diagnosis: Difficulty in walking, not elsewhere classified (R26.2)      Time: 2703-5009 PT Time Calculation (min) (ACUTE ONLY): 23 min  Charges:  $Gait Training: 23-37 mins                    G Codes:       Roney Marion, [place] Pager [phone] Office [phone]    Colletta Maryland 08/30/2017, 4:27 PM

## 2017-08-30 NOTE — Care Management Note (Addendum)
Case Management Note  Patient Details  Name: Glenda [name] MRN: [id number] Date of Birth: 1933/11/28  Subjective/Objective:  Admitted for Acute renal failure. PCP noted.            Action/Plan: INR follow-up.   Call place to [place] at [phone], spoke with [facility] and advised patient will need follow up lab work 3 days post discharge and may require Lovenox 60 mq SQ.  Per Estrella Deeds will notify patient's PCP of discharge plan.  NCM encourage return call for any additional assistance.  Glenda [name] verbalized understanding.  Expected Discharge Date:   To Be determined               Expected Discharge Plan:  Home/Self Care  In-House Referral:    N/A Discharge planning Services  CM Consult  Status of Service:  In process, will continue to follow  Kristen Cardinal, RN 08/30/2017, 10:20 AM

## 2017-08-31 LAB — CBC
HCT: 29.9 % — ABNORMAL LOW (ref 36.0–46.0)
HEMOGLOBIN: 9.1 g/dL — AB (ref 12.0–15.0)
MCH: 27 pg (ref 26.0–34.0)
MCHC: 30.4 g/dL (ref 30.0–36.0)
MCV: 88.7 fL (ref 78.0–100.0)
PLATELETS: 212 10*3/uL (ref 150–400)
RBC: 3.37 MIL/uL — AB (ref 3.87–5.11)
RDW: 14 % (ref 11.5–15.5)
WBC: 6.3 10*3/uL (ref 4.0–10.5)

## 2017-08-31 LAB — GLUCOSE, CAPILLARY
GLUCOSE-CAPILLARY: 129 mg/dL — AB (ref 65–99)
GLUCOSE-CAPILLARY: 224 mg/dL — AB (ref 65–99)
GLUCOSE-CAPILLARY: 211 mg/dL — AB (ref 65–99)
Glucose-Capillary: 291 mg/dL — ABNORMAL HIGH (ref 65–99)

## 2017-08-31 LAB — PROTIME-INR
INR: 1.05
PROTHROMBIN TIME: 13.6 s (ref 11.4–15.2)

## 2017-08-31 MED ORDER — COUMADIN BOOK
Freq: Once | Status: DC
  Filled 2017-08-31: qty 1

## 2017-08-31 MED ORDER — WARFARIN SODIUM 7.5 MG PO TABS
7.5000 mg | ORAL_TABLET | Freq: Once | ORAL | Status: AC
  Administered 2017-08-31: 7.5 mg via ORAL
  Filled 2017-08-31: qty 1

## 2017-08-31 NOTE — Progress Notes (Signed)
PROGRESS NOTE        PATIENT DETAILS Name: Glenda [name] Age: 84 y.o. Sex: female Date of Birth: 04/25/1934 Admit Date: 08/20/2017 Admitting Physician Velvet Bathe, MD [id number], Elaina Pattee, MD  Brief Narrative: Patient is a 84 y.o. female with prior history of DM-2, hypertension, chronic kidney disease stage IV, chronic diastolic heart failure presented to the hospital with acute hypoxic respiratory failure secondary to acute on chronic diastolic heart failure and non-STEMI, hospital course completed by A. fib with RVR, persistent volume overload in spite of optimal diuretic therapy, subsequently seen by nephrology and initiated on hemodialysis.  See below for further details  Subjective: Feels much better-no major issues overnight.  Denies chest pain or shortness of breath.  Assessment/Plan: Progressive chronic kidney disease stage IV-now ESRD: As noted above-very poor response to diuretics-subsequently evaluated by nephrology-temporary HD catheter inserted on 5/2 and started on hemodialysis.  Nephrology following and directing care.  Acute hypoxic respiratory failure: Secondary to pulmonary edema/acute on chronic diastolic heart failure.  Resolved following initiation of hemodialysis.  Currently on room air.  Acute on chronic diastolic heart failure:As noted she above-poor response diuretics-and has been started on HD.  Volume is being managed through HD.  Elevated troponin due to demand ischemia: Cardiology following-recommendations are to continue with medical management-now only on aspirin (previously on Plavix but discontinued) -avoiding dual antiplatelet agent as patient is also on anticoagulation.  Atrial fibrillation: 2 brief episodes of RVR noted during this hospital stay-continue metoprolol-INR still subtherapeutic-currently on overlapping Lovenox and Coumadin.  Hypertension: controlled-continue Imdur, hydralazine, clonidine and  beta-blocker.  DM-2: CBG stable with 10 units of Levemir at bedtime, 3 units of NovoLog with meals and SSI.  Follow and adjust accordingly.  Prior history of CVA: Continue aspirin/statin and Coumadin at this time.  Follow  Anemia: Secondary to ESRD/chronic kidney disease-defer to nephrology  Breast cancer: In remission-follow with primary oncologist as previous.  DVT Prophylaxis: Full dose anticoagulation withLovenox/Coumadin  Code Status: Full code   Family Communication: None at bedside  Disposition Plan: Remain inpatient-probably home tomorrow   Antimicrobial agents: Anti-infectives (From admission, onward)   Start     Dose/Rate Route Frequency Ordered Stop   08/29/17 0832  ceFAZolin (ANCEF) 2-4 GM/100ML-% IVPB    Note to Pharmacy:  Soyla Dryer   : cabinet override      08/29/17 7121 08/29/17 0854      Procedures: 5/2>> temporary HD catheter 5/2>> tunneled HD catheter  CONSULTS:  cardiology and nephrology  Interventional radiology  Time spent: 25-minutes-Greater than 50% of this time was spent in counseling, explanation of diagnosis, planning of further management, and coordination of care.  MEDICATIONS: Scheduled Meds: . aspirin EC  81 mg Oral Daily  . citalopram  20 mg Oral Daily  . cloNIDine  0.2 mg Oral BID  . enoxaparin (LOVENOX) injection  60 mg Subcutaneous Q24H  . feeding supplement (NEPRO CARB STEADY)  237 mL Oral BID BM  . hydrALAZINE  25 mg Oral Q8H  . insulin aspart  0-9 Units Subcutaneous TID WC  . insulin aspart  3 Units Subcutaneous TID WC  . insulin detemir  10 Units Subcutaneous QHS  . isosorbide mononitrate  30 mg Oral Daily  . metoprolol succinate  25 mg Oral Daily  . multivitamin  1 tablet Oral QHS  . pantoprazole  40 mg Oral Daily  .  polyethylene glycol  17 g Oral Daily  . pravastatin  80 mg Oral Daily  . sodium chloride flush  3 mL Intravenous Q12H  . warfarin  7.5 mg Oral ONCE-1800  . Warfarin - Pharmacist Dosing Inpatient    Does not apply q1800   Continuous Infusions: . sodium chloride     PRN Meds:.sodium chloride, acetaminophen, famotidine, ondansetron (ZOFRAN) IV, simethicone, sodium chloride flush   PHYSICAL EXAM: Vital signs: Vitals:   08/30/17 2025 08/31/17 0500 08/31/17 0511 08/31/17 0900  BP: (!) 167/69  (!) 176/53 (!) 146/55  Pulse: 72  66 68  Resp: 17  19 20   Temp: 98 F (36.7 C)   98 F (36.7 C)  TempSrc: Oral   Oral  SpO2: 100%  98% 98%  Weight: 66 kg (145 lb 8.2 oz) 66 kg (145 lb 8.1 oz)    Height:       Filed Weights   08/30/17 0500 08/30/17 2025 08/31/17 0500  Weight: 66 kg (145 lb 8.1 oz) 66 kg (145 lb 8.2 oz) 66 kg (145 lb 8.1 oz)   Body mass index is 23.48 kg/m.   General appearance :Awake, alert, not in any distress.  Eyes:, pupils equally reactive to light and accomodation HEENT: Atraumatic and Normocephalic Neck: supple Resp:Good air entry bilaterally, no added sounds  CVS: S1 S2 regular, no murmurs.  GI: Bowel sounds present, Non tender and not distended with no gaurding, rigidity or rebound.No organomegaly Extremities: B/L Lower Ext shows no edema, both legs are warm to touch Musculoskeletal:No digital cyanosis Skin:No Rash, warm and dry Wounds:N/A  I have personally reviewed following labs and imaging studies  LABORATORY DATA: CBC: Recent Labs  Lab 08/27/17 0613 08/29/17 1043 08/31/17 0410  WBC 5.2 5.1 6.3  HGB 8.0* 9.9* 9.1*  HCT 25.9* 32.0* 29.9*  MCV 88.7 88.6 88.7  PLT 198 207 009    Basic Metabolic Panel: Recent Labs  Lab 08/25/17 0639 08/27/17 0613  NA 138 136  K 4.3 3.3*  CL 101 96*  CO2 28 30  GLUCOSE 204* 138*  BUN 53* 35*  CREATININE 3.69* 2.79*  CALCIUM 8.3* 8.5*    GFR: Estimated Creatinine Clearance: 14.8 mL/min (A) (by C-G formula based on SCr of 2.79 mg/dL (H)).  Liver Function Tests: Recent Labs  Lab 08/25/17 2340  ALT 16   No results for input(s): LIPASE, AMYLASE in the last 168 hours. No results for input(s):  AMMONIA in the last 168 hours.  Coagulation Profile: Recent Labs  Lab 08/29/17 1043 08/30/17 0718 08/31/17 0410  INR 0.96 1.02 1.05    Cardiac Enzymes: No results for input(s): CKTOTAL, CKMB, CKMBINDEX, TROPONINI in the last 168 hours.  BNP (last 3 results) No results for input(s): PROBNP in the last 8760 hours.  HbA1C: No results for input(s): HGBA1C in the last 72 hours.  CBG: Recent Labs  Lab 08/30/17 1135 08/30/17 1642 08/30/17 2024 08/31/17 0749 08/31/17 1143  GLUCAP 273* 207* 158* 129* 291*    Lipid Profile: No results for input(s): CHOL, HDL, LDLCALC, TRIG, CHOLHDL, LDLDIRECT in the last 72 hours.  Thyroid Function Tests: No results for input(s): TSH, T4TOTAL, FREET4, T3FREE, THYROIDAB in the last 72 hours.  Anemia Panel: No results for input(s): VITAMINB12, FOLATE, FERRITIN, TIBC, IRON, RETICCTPCT in the last 72 hours.  Urine analysis:    Component Value Date/Time   COLORURINE YELLOW 08/23/2017 0824   APPEARANCEUR CLOUDY (A) 08/23/2017 0824   LABSPEC 1.009 08/23/2017 0824   PHURINE 5.0 08/23/2017 3818  GLUCOSEU NEGATIVE 08/23/2017 0824   HGBUR NEGATIVE 08/23/2017 0824   BILIRUBINUR NEGATIVE 08/23/2017 0824   KETONESUR NEGATIVE 08/23/2017 0824   PROTEINUR NEGATIVE 08/23/2017 0824   UROBILINOGEN 0.2 07/04/2014 1900   NITRITE NEGATIVE 08/23/2017 0824   LEUKOCYTESUR LARGE (A) 08/23/2017 0824    Sepsis Labs: Lactic Acid, Venous    Component Value Date/Time   LATICACIDVEN 0.88 02/28/2017 1058    MICROBIOLOGY: No results found for this or any previous visit (from the past 240 hour(s)).  RADIOLOGY STUDIES/RESULTS: Dg Chest 2 View  Result Date: 08/22/2017 CLINICAL DATA:  CHF with shortness of breath EXAM: CHEST - 2 VIEW COMPARISON:  08/22/2017, 08/20/2017, 03/06/2017 FINDINGS: Low lung volumes. Tiny pleural effusions. Mild cardiomegaly with vascular congestion, overall decreased. Decreased pulmonary edema with minimal residual. Aortic  atherosclerosis. No pneumothorax. IMPRESSION: 1. Low lung volumes. 2. Cardiomegaly with vascular congestion and minimal edema, overall improved since prior radiograph 3. Small pleural effusions. Electronically Signed   By: Donavan Foil M.D.   On: 08/22/2017 20:00   Ir Fluoro Guide Cv Line Right  Result Date: 08/29/2017 INDICATION: End-stage renal disease. Please convert temporary dialysis catheter to a tunneled/permanent dialysis catheter for the continuation of dialysis. EXAM: FLUOROSCOPIC GUIDED CONVERSION OF NON TUNNELED TO TUNNELED CENTRAL VENOUS HEMODIALYSIS CATHETER COMPARISON:  Ultrasound and fluoroscopic guided placement of a non tunneled temporary dialysis catheter - 08/25/2017 MEDICATIONS: Ancef 2 g IV; the antibiotic was administered with an appropriate time frame prior to the initiation of the procedure. ANESTHESIA/SEDATION: Moderate (conscious) sedation was employed during this procedure. A total of Versed 1 mg and Fentanyl 50 mcg was administered intravenously. Moderate Sedation Time: 14 minutes. The patient's level of consciousness and vital signs were monitored continuously by radiology nursing throughout the procedure under my direct supervision. FLUOROSCOPY TIME:  36 seconds (5 mGy) COMPLICATIONS: None immediate. PROCEDURE: Informed written consent was obtained from the patient after a discussion of the risks, benefits, and alternatives to treatment. Questions regarding the procedure were encouraged and answered. The skin and external portion of the existing hemodialysis catheter was prepped with chlorhexidine in a sterile fashion, and a sterile drape was applied covering the operative field. Maximum barrier sterile technique with sterile gowns and gloves were used for the procedure. A timeout was performed prior to the initiation of the procedure. A lumen of the none tunneled temporary dialysis catheter was cannulated with a stiff Glidewire in utilized for measurement purposes. Next, the  Glidewire was advanced to the level of the IVC. A 19 cm tip to cuff palindrome dialysis catheter was tunneled from a site along the anterior chest to the venotomy site. Under intermittent fluoroscopic guidance, the temporary dialysis catheter was exchanged for a peel-away sheath. The tunneled dialysis catheter was inserted into the peel-away sheath with tips open terminating within in the superior aspect the right atrium. Final catheter positioning was confirmed and documented with a spot fluoroscopic image. The catheter aspirates and flushes normally. The catheter was flushed with appropriate volume heparin dwells. The catheter exit site was secured with a 0-Prolene retention sutures. The venotomy site was apposed with an interrupted 4 0 Vicryl suture, derma bond and Steri-Strips. Both lumens were heparinized. A dressing was placed. The patient tolerated the procedure well without immediate post procedural complication. IMPRESSION: Successful fluoroscopic guided conversion of a temporary to a permanent 19 cm tip to cuff tunneled hemodialysis catheter with tips terminating within the right atrium. The catheter is ready for immediate use. Electronically Signed   By: Eldridge Abrahams.D.  On: 08/29/2017 09:35   Ir Fluoro Guide Cv Line Right  Result Date: 08/25/2017 INDICATION: 84 year old female with a history renal failure. EXAM: IMAGE GUIDED TEMPORARY HEMODIALYSIS CATHETER PLACEMENT MEDICATIONS: None ANESTHESIA/SEDATION: None FLUOROSCOPY TIME:  Fluoroscopy Time: 0 minutes 6 seconds (1.8 mGy). COMPLICATIONS: None PROCEDURE: Informed written consent was obtained from the patient and the patient's family after a thorough discussion of the procedural risks, benefits and alternatives. All questions were addressed. A timeout was performed prior to the initiation of the procedure. The right neck and chest was prepped with chlorhexidine, and draped in the usual sterile fashion using maximum barrier technique (cap and mask,  sterile gown, sterile gloves, large sterile sheet, hand hygiene and cutaneous antiseptic). Local anesthesia was attained by infiltration with 1% lidocaine without epinephrine. Ultrasound demonstrated patency of the right internal jugular vein, and this was documented with an image. Under real-time ultrasound guidance, this vein was accessed with a 21 gauge micropuncture needle and image documentation was performed. A small dermatotomy was made at the access site with an 11 scalpel. A 0.018" wire was advanced into the SVC and the access needle exchanged for a 48F micropuncture vascular sheath. The 0.018" wire was then removed and a 0.035" wire advanced into the IVC. Upon withdrawal of the 018 wire, the wire was marked for appropriate length of the internal portion of the catheter. A 19 cm catheter was selected. Skin and subcutaneous tissues were serially dilated. Catheter was placed on the wire. The catheter tip is positioned in the upper right atrium. This was documented with a spot image. Both ports of the hemodialysis catheter were then tested for excellent function. The ports were then locked with heparinized lock. Patient tolerated the procedure well and remained hemodynamically stable throughout. No complications were encountered and no significant blood loss was encountered. IMPRESSION: Status post right IJ temporary HD catheter placement. Catheter ready for use. Catheter may be converted if required. Signed, Dulcy Fanny. Earleen Newport, DO [facility] Electronically Signed   By: Corrie Mckusick D.O.   On: 08/25/2017 12:10   Ir US Guide Vasc Access Right  Result Date: 08/25/2017 INDICATION: 84 year old female with a history renal failure. EXAM: IMAGE GUIDED TEMPORARY HEMODIALYSIS CATHETER PLACEMENT MEDICATIONS: None ANESTHESIA/SEDATION: None FLUOROSCOPY TIME:  Fluoroscopy Time: 0 minutes 6 seconds (1.8 mGy). COMPLICATIONS: None PROCEDURE: Informed written consent  was obtained from the patient and the patient's family after a thorough discussion of the procedural risks, benefits and alternatives. All questions were addressed. A timeout was performed prior to the initiation of the procedure. The right neck and chest was prepped with chlorhexidine, and draped in the usual sterile fashion using maximum barrier technique (cap and mask, sterile gown, sterile gloves, large sterile sheet, hand hygiene and cutaneous antiseptic). Local anesthesia was attained by infiltration with 1% lidocaine without epinephrine. Ultrasound demonstrated patency of the right internal jugular vein, and this was documented with an image. Under real-time ultrasound guidance, this vein was accessed with a 21 gauge micropuncture needle and image documentation was performed. A small dermatotomy was made at the access site with an 11 scalpel. A 0.018" wire was advanced into the SVC and the access needle exchanged for a 48F micropuncture vascular sheath. The 0.018" wire was then removed and a 0.035" wire advanced into the IVC. Upon withdrawal of the 018 wire, the wire was marked for appropriate length of the internal portion of the catheter. A 19 cm catheter was selected. Skin and subcutaneous tissues were serially dilated.  Catheter was placed on the wire. The catheter tip is positioned in the upper right atrium. This was documented with a spot image. Both ports of the hemodialysis catheter were then tested for excellent function. The ports were then locked with heparinized lock. Patient tolerated the procedure well and remained hemodynamically stable throughout. No complications were encountered and no significant blood loss was encountered. IMPRESSION: Status post right IJ temporary HD catheter placement. Catheter ready for use. Catheter may be converted if required. Signed, Dulcy Fanny. Earleen Newport, DO [facility] Electronically Signed   By: Corrie Mckusick  D.O.   On: 08/25/2017 12:10   Dg Chest Port 1 View  Result Date: 08/24/2017 CLINICAL DATA:  Shortness of breath EXAM: PORTABLE CHEST 1 VIEW COMPARISON:  08/22/2017 FINDINGS: Cardiomegaly. Diffuse interstitial prominence. Perihilar and lower lobe opacities, left greater than right. Findings have worsened since prior study, likely related to edema. No visible significant effusions or acute bony abnormality. IMPRESSION: Increasing interstitial prominence and perihilar and lower lobe opacities, left greater than right, most likely edema/CHF. Electronically Signed   By: Rolm Baptise M.D.   On: 08/24/2017 10:03   Dg Chest Port 1 View  Result Date: 08/22/2017 CLINICAL DATA:  Shortness of breath. EXAM: PORTABLE CHEST 1 VIEW COMPARISON:  Radiograph of August 20, 2017. FINDINGS: Stable cardiomegaly with bilateral pulmonary edema. Atherosclerosis of thoracic aorta is noted. No pneumothorax or pleural effusion is noted. Bony thorax is unremarkable. IMPRESSION: Stable cardiomegaly with bilateral pulmonary edema. Aortic Atherosclerosis (ICD10-I70.0). Electronically Signed   By: Marijo Conception, M.D.   On: 08/22/2017 07:22   Dg Chest Port 1 View  Result Date: 08/20/2017 CLINICAL DATA:  Shortness of breath since yesterday.  Arm pain. EXAM: PORTABLE CHEST 1 VIEW COMPARISON:  March 06, 2017 FINDINGS: Stable mild cardiomegaly. No pneumothorax. Moderate pulmonary edema. No other changes. IMPRESSION: Cardiomegaly and moderate pulmonary edema. Electronically Signed   By: Dorise Bullion III M.D   On: 08/20/2017 09:08     LOS: 11 days   Oren Binet, MD  [facility] Pager:[phone]  If 7PM-7AM, please contact night-coverage [email or web address] Password [email or web address] 08/31/2017, 1:54 PM

## 2017-08-31 NOTE — Progress Notes (Signed)
ANTICOAGULATION CONSULT NOTE  Pharmacy Consult for Warfarin Indication: atrial fibrillation   Assessment: 42 YOF with progressive CKD started on HD this admission with new onset AFib. Started on warfarin 5/6 as procedures complete, Plavix stopped per cardiology's recommendations d/t risk of bleed and no stents. INR has been slow to rise- 1.05 after two doses of warfarin 5mg . No bleeding noted.  Goal of Therapy:  INR 2-3 Monitor platelets by anticoagulation protocol: Yes   Plan:  -Recommend warfarin 7.5mg  daily (increase from yesterday's recommendation given INR slow to rise) with an INR check on Friday 5/10 (CM communicated with [facility] who is contacting PCP) -Lovenox bridge- 60mg  subQ q24h. This can be stopped when INR >2 -Daily INR while here  -Follow for s/s bleeding   No Known Allergies  Patient Measurements: Height: 5\' 6"  (167.6 cm) Weight: 145 lb 8.1 oz (66 kg) IBW/kg (Calculated) : 59.3  Vital Signs: Temp: 98 F (36.7 C) (05/07 2025) Temp Source: Oral (05/07 2025) BP: 176/53 (05/08 0511) Pulse Rate: 66 (05/08 0511)    Labs: Recent Labs    08/29/17 1043 08/30/17 0718 08/31/17 0410  HGB 9.9*  --  9.1*  HCT 32.0*  --  29.9*  PLT 207  --  212  LABPROT 12.6 13.3 13.6  INR 0.96 1.02 1.05    Estimated Creatinine Clearance: 14.8 mL/min (A) (by C-G formula based on SCr of 2.79 mg/dL (H)).   Montzerrat Brunell D. Saliyah Gillin, PharmD, BCPS Clinical Pharmacist Clinical Phone for 08/31/2017 until 3:30pm: [phone] If after 3:30pm, please call main pharmacy at [phone] 08/31/2017 8:08 AM

## 2017-08-31 NOTE — Care Management Important Message (Signed)
Important Message  Patient Details  Name: Glenda [name] MRN: [id number] Date of Birth: October 20, 1933   Medicare Important Message Given:  Yes    Benjamin Casanas P Kiet Geer 08/31/2017, 3:56 PM

## 2017-08-31 NOTE — Progress Notes (Signed)
[facility] ROUNDING NOTE   Subjective:   Interval History: HTN and Diabetes and new ESRD  - CLIP and continue dialysis  Decisions being made for placement    Continue with dialysis this morning no issues   Objective:  Vital signs in last 24 hours:  Temp:  [97.9 F (36.6 C)-98 F (36.7 C)] 98 F (36.7 C) (05/08 0900) Pulse Rate:  [66-72] 68 (05/08 0900) Resp:  [17-20] 20 (05/08 0900) BP: (140-176)/(53-69) 146/55 (05/08 0900) SpO2:  [98 %-100 %] 98 % (05/08 0900) Weight:  [145 lb 8.1 oz (66 kg)-145 lb 8.2 oz (66 kg)] 145 lb 8.1 oz (66 kg) (05/08 0500)  Weight change: -10 lb 2.1 oz (-4.596 kg) Filed Weights   08/30/17 0500 08/30/17 2025 08/31/17 0500  Weight: 145 lb 8.1 oz (66 kg) 145 lb 8.2 oz (66 kg) 145 lb 8.1 oz (66 kg)    Intake/Output: I/O last 3 completed shifts: In: 720 [P.O.:720] Out: 300 [Urine:300]   Intake/Output this shift:  Total I/O In: 480 [P.O.:480] Out: 0   CVS- RRR RS- CTA ABD- BS present soft non-distended EXT- no edema   Basic Metabolic Panel: Recent Labs  Lab 08/25/17 0639 08/27/17 0613  NA 138 136  K 4.3 3.3*  CL 101 96*  CO2 28 30  GLUCOSE 204* 138*  BUN 53* 35*  CREATININE 3.69* 2.79*  CALCIUM 8.3* 8.5*    Liver Function Tests: Recent Labs  Lab 08/25/17 2340  ALT 16   No results for input(s): LIPASE, AMYLASE in the last 168 hours. No results for input(s): AMMONIA in the last 168 hours.  CBC: Recent Labs  Lab 08/27/17 0613 08/29/17 1043 08/31/17 0410  WBC 5.2 5.1 6.3  HGB 8.0* 9.9* 9.1*  HCT 25.9* 32.0* 29.9*  MCV 88.7 88.6 88.7  PLT 198 207 212    Cardiac Enzymes: No results for input(s): CKTOTAL, CKMB, CKMBINDEX, TROPONINI in the last 168 hours.  BNP: Invalid input(s): POCBNP  CBG: Recent Labs  Lab 08/30/17 1135 08/30/17 1642 08/30/17 2024 08/31/17 0749 08/31/17 1143  GLUCAP 273* 207* 158* 129* 291*    Microbiology: Results for orders placed or performed during the hospital  encounter of 02/28/17  MRSA PCR Screening     Status: None   Collection Time: 02/28/17  9:39 PM  Result Value Ref Range Status   MRSA by PCR NEGATIVE NEGATIVE Final    Comment:        The GeneXpert MRSA Assay (FDA approved for NASAL specimens only), is one component of a comprehensive MRSA colonization surveillance program. It is not intended to diagnose MRSA infection nor to guide or monitor treatment for MRSA infections.     Coagulation Studies: Recent Labs    08/29/17 1043 08/30/17 0718 08/31/17 0410  LABPROT 12.6 13.3 13.6  INR 0.96 1.02 1.05    Urinalysis: No results for input(s): COLORURINE, LABSPEC, PHURINE, GLUCOSEU, HGBUR, BILIRUBINUR, KETONESUR, PROTEINUR, UROBILINOGEN, NITRITE, LEUKOCYTESUR in the last 72 hours.  Invalid input(s): APPERANCEUR    Imaging: No results found.   Medications:   . sodium chloride     . aspirin EC  81 mg Oral Daily  . citalopram  20 mg Oral Daily  . cloNIDine  0.2 mg Oral BID  . enoxaparin (LOVENOX) injection  60 mg Subcutaneous Q24H  . feeding supplement (NEPRO CARB STEADY)  237 mL Oral BID BM  . hydrALAZINE  25 mg Oral Q8H  . insulin aspart  0-9 Units Subcutaneous TID WC  . insulin  aspart  3 Units Subcutaneous TID WC  . insulin detemir  10 Units Subcutaneous QHS  . isosorbide mononitrate  30 mg Oral Daily  . metoprolol succinate  25 mg Oral Daily  . multivitamin  1 tablet Oral QHS  . pantoprazole  40 mg Oral Daily  . polyethylene glycol  17 g Oral Daily  . pravastatin  80 mg Oral Daily  . sodium chloride flush  3 mL Intravenous Q12H  . warfarin  7.5 mg Oral ONCE-1800  . Warfarin - Pharmacist Dosing Inpatient   Does not apply q1800   sodium chloride, acetaminophen, famotidine, ondansetron (ZOFRAN) IV, simethicone, sodium chloride flush  Assessment/ Plan:   ESRD- new start ESRD  Cardiorenal syndrome  Continue to follow  Dialysis schedule today but may need to have dialysis in AM   ANEMIA- Hb 9.9  MBD- controlled    HTN/VOL- controlled  ACCESS- for tunneled catheter   Patient awaiting for CLIP placement at this time   LOS: 11 Jasma Seevers W @TODAY @2 :48 PM

## 2017-09-01 DIAGNOSIS — N185 Chronic kidney disease, stage 5: Secondary | ICD-10-CM

## 2017-09-01 LAB — RENAL FUNCTION PANEL
Albumin: 2.7 g/dL — ABNORMAL LOW (ref 3.5–5.0)
Anion gap: 8 (ref 5–15)
BUN: 30 mg/dL — ABNORMAL HIGH (ref 6–20)
CHLORIDE: 100 mmol/L — AB (ref 101–111)
CO2: 30 mmol/L (ref 22–32)
CREATININE: 2.82 mg/dL — AB (ref 0.44–1.00)
Calcium: 8.4 mg/dL — ABNORMAL LOW (ref 8.9–10.3)
GFR, EST AFRICAN AMERICAN: 17 mL/min — AB (ref >60)
GFR, EST NON AFRICAN AMERICAN: 15 mL/min — AB (ref >60)
Glucose, Bld: 164 mg/dL — ABNORMAL HIGH (ref 65–99)
Phosphorus: 3.9 mg/dL (ref 2.5–4.6)
Potassium: 4.1 mmol/L (ref 3.5–5.1)
Sodium: 138 mmol/L (ref 135–145)

## 2017-09-01 LAB — CBC
HEMATOCRIT: 27.2 % — AB (ref 36.0–46.0)
HEMOGLOBIN: 8.4 g/dL — AB (ref 12.0–15.0)
MCH: 27.4 pg (ref 26.0–34.0)
MCHC: 30.9 g/dL (ref 30.0–36.0)
MCV: 88.6 fL (ref 78.0–100.0)
PLATELETS: 228 10*3/uL (ref 150–400)
RBC: 3.07 MIL/uL — AB (ref 3.87–5.11)
RDW: 14.1 % (ref 11.5–15.5)
WBC: 5.1 10*3/uL (ref 4.0–10.5)

## 2017-09-01 LAB — PROTIME-INR
INR: 1.33
PROTHROMBIN TIME: 16.4 s — AB (ref 11.4–15.2)

## 2017-09-01 LAB — GLUCOSE, CAPILLARY
GLUCOSE-CAPILLARY: 115 mg/dL — AB (ref 65–99)
Glucose-Capillary: 170 mg/dL — ABNORMAL HIGH (ref 65–99)

## 2017-09-01 MED ORDER — WARFARIN SODIUM 7.5 MG PO TABS: 7.5000 mg | ORAL_TABLET | Freq: Once | ORAL | Status: DC

## 2017-09-01 MED ORDER — DARBEPOETIN ALFA 100 MCG/0.5ML IJ SOSY
100.0000 ug | PREFILLED_SYRINGE | INTRAMUSCULAR | Status: DC
  Administered 2017-09-01: 100 ug via SUBCUTANEOUS

## 2017-09-01 MED ORDER — DARBEPOETIN ALFA 100 MCG/0.5ML IJ SOSY
PREFILLED_SYRINGE | INTRAMUSCULAR | Status: AC
  Administered 2017-09-01: 100 ug via SUBCUTANEOUS
  Filled 2017-09-01: qty 0.5

## 2017-09-01 MED ORDER — ENOXAPARIN SODIUM 60 MG/0.6ML ~~LOC~~ SOLN: 60.0000 mg | SUBCUTANEOUS | 0 refills | Status: DC

## 2017-09-01 MED ORDER — METOPROLOL SUCCINATE ER 25 MG PO TB24: 25.0000 mg | ORAL_TABLET | Freq: Every day | ORAL | 0 refills | Status: DC

## 2017-09-01 MED ORDER — SODIUM CHLORIDE 0.9 % IV SOLN
125.0000 mg | INTRAVENOUS | Status: DC
  Administered 2017-09-01: 125 mg via INTRAVENOUS
  Filled 2017-09-01: qty 10

## 2017-09-01 MED ORDER — WARFARIN SODIUM 5 MG PO TABS: 7.5000 mg | ORAL_TABLET | Freq: Once | ORAL | 0 refills | Status: DC

## 2017-09-01 NOTE — Progress Notes (Signed)
[facility] ROUNDING NOTE   Subjective:   Interval History: HTN and Diabetes and new ESRD  - CLIP and continue dialysis  Decisions being made for placement    Continue with dialysis this morning no issues   Objective:  Vital signs in last 24 hours:  Temp:  [97.8 F (36.6 C)-98.4 F (36.9 C)] 97.8 F (36.6 C) (05/09 0804) Pulse Rate:  [61-72] 63 (05/09 1030) Resp:  [13-20] 14 (05/09 1030) BP: (168-203)/(49-74) 172/66 (05/09 1030) SpO2:  [97 %-100 %] 98 % (05/09 0804) Weight:  [145 lb 8.1 oz (66 kg)-150 lb 2.1 oz (68.1 kg)] 150 lb 2.1 oz (68.1 kg) (05/09 0804)  Weight change: 0 lb (0 kg) Filed Weights   08/31/17 2056 09/01/17 0500 09/01/17 0804  Weight: 145 lb 8.2 oz (66 kg) 145 lb 8.1 oz (66 kg) 150 lb 2.1 oz (68.1 kg)    Intake/Output: I/O last 3 completed shifts: In: 480 [P.O.:480] Out: 300 [Urine:300]   Intake/Output this shift:  No intake/output data recorded.  CVS- RRR RS- CTA ABD- BS present soft non-distended EXT- no edema   Basic Metabolic Panel: Recent Labs  Lab 08/27/17 0613 09/01/17 1109  NA 136 138  K 3.3* 4.1  CL 96* 100*  CO2 30 30  GLUCOSE 138* 164*  BUN 35* 30*  CREATININE 2.79* 2.82*  CALCIUM 8.5* 8.4*  PHOS  --  3.9    Liver Function Tests: Recent Labs  Lab 08/25/17 2340 09/01/17 1109  ALT 16  --   ALBUMIN  --  2.7*   No results for input(s): LIPASE, AMYLASE in the last 168 hours. No results for input(s): AMMONIA in the last 168 hours.  CBC: Recent Labs  Lab 08/27/17 0613 08/29/17 1043 08/31/17 0410  WBC 5.2 5.1 6.3  HGB 8.0* 9.9* 9.1*  HCT 25.9* 32.0* 29.9*  MCV 88.7 88.6 88.7  PLT 198 207 212    Cardiac Enzymes: No results for input(s): CKTOTAL, CKMB, CKMBINDEX, TROPONINI in the last 168 hours.  BNP: Invalid input(s): POCBNP  CBG: Recent Labs  Lab 08/31/17 0749 08/31/17 1143 08/31/17 1636 08/31/17 2056 09/01/17 0734  GLUCAP 129* 291* 224* 211* 170*    Microbiology: Results for orders  placed or performed during the hospital encounter of 02/28/17  MRSA PCR Screening     Status: None   Collection Time: 02/28/17  9:39 PM  Result Value Ref Range Status   MRSA by PCR NEGATIVE NEGATIVE Final    Comment:        The GeneXpert MRSA Assay (FDA approved for NASAL specimens only), is one component of a comprehensive MRSA colonization surveillance program. It is not intended to diagnose MRSA infection nor to guide or monitor treatment for MRSA infections.     Coagulation Studies: Recent Labs    08/30/17 0718 08/31/17 0410 09/01/17 0514  LABPROT 13.3 13.6 16.4*  INR 1.02 1.05 1.33    Urinalysis: No results for input(s): COLORURINE, LABSPEC, PHURINE, GLUCOSEU, HGBUR, BILIRUBINUR, KETONESUR, PROTEINUR, UROBILINOGEN, NITRITE, LEUKOCYTESUR in the last 72 hours.  Invalid input(s): APPERANCEUR    Imaging: No results found.   Medications:   . sodium chloride     . aspirin EC  81 mg Oral Daily  . citalopram  20 mg Oral Daily  . cloNIDine  0.2 mg Oral BID  . coumadin book   Does not apply Once  . enoxaparin (LOVENOX) injection  60 mg Subcutaneous Q24H  . feeding supplement (NEPRO CARB STEADY)  237 mL Oral BID BM  .  hydrALAZINE  25 mg Oral Q8H  . insulin aspart  0-9 Units Subcutaneous TID WC  . insulin aspart  3 Units Subcutaneous TID WC  . insulin detemir  10 Units Subcutaneous QHS  . isosorbide mononitrate  30 mg Oral Daily  . metoprolol succinate  25 mg Oral Daily  . multivitamin  1 tablet Oral QHS  . pantoprazole  40 mg Oral Daily  . polyethylene glycol  17 g Oral Daily  . pravastatin  80 mg Oral Daily  . sodium chloride flush  3 mL Intravenous Q12H  . Warfarin - Pharmacist Dosing Inpatient   Does not apply q1800   sodium chloride, acetaminophen, famotidine, ondansetron (ZOFRAN) IV, simethicone, sodium chloride flush  Assessment/ Plan:   ESRD- new start ESRD  Cardiorenal syndrome  Continue to follow   Continues to do well on dialysis with no  issues  ANEMIA- Hb 9.1  Some low iron will start aranesp   MBD- controlled   HTN/VOL- controlled  ACCESS- for tunneled catheter   Patient awaiting for CLIP placement at this time   LOS: 12 Glenda [name] W @TODAY @11 :51 AM

## 2017-09-01 NOTE — Progress Notes (Signed)
Pt. discharged to home. Pt after visit summary reviewed with patient and her two daughters, they are capable of re verbalizing medications and follow up appointments. Pt remains stable. No signs and symptoms of distress. Educated to return to ER in the event of SOB, dizziness, chest pain, or fainting. Mady Gemma, RN

## 2017-09-01 NOTE — Progress Notes (Signed)
PT Cancellation Note  Patient Details Name: Glenda [name] MRN: [id number] DOB: 12-11-1933   Cancelled Treatment:    Reason Eval/Treat Not Completed: Patient declined, no reason specified. Patient discharging home today. Politely declined additional physical therapy services this afternoon. Has no further questions/concerns.  Ellamae Sia, PT, DPT Acute Rehabilitation Services  Pager: [phone]    Willy Eddy 09/01/2017, 5:01 PM

## 2017-09-01 NOTE — Progress Notes (Addendum)
Call place to [place] at [phone], Left message requested return call regarding discharge planning for Glenda [name] that patient is to discharge today and will need follow up lab work on Saturday. NCM left contact information.  Call place to [place] at [phone], spoke with Raquel Sarna.  Raquel Sarna states that Estrella Deeds is out of the office today.  NCM advised Raquel Sarna patient is discharging today and has been prescribed Lovenox injections, bridging Coumadin and will need Lab: INR completed on Saturday.  Raquel Sarna stated she will get information to Dr. Jimmye Norman and that Dr. Jimmye Norman can access Epic to obtain discharge summary.  Also verified that patient does have transportation at discharge with staff RN Kami.

## 2017-09-01 NOTE — Progress Notes (Addendum)
ANTICOAGULATION CONSULT NOTE  Pharmacy Consult for Warfarin Indication: atrial fibrillation   Assessment: 57 YOF with progressive CKD started on HD this admission with new onset AFib. Started on warfarin 5/6 as procedures complete, Plavix stopped per cardiology's recommendations d/t risk of bleed and no stents. INR has been slow to rise- up to 1.33 today.  Goal of Therapy:  INR 2-3 Monitor platelets by anticoagulation protocol: Yes   Plan:  -Recommend warfarin 7.5mg  daily with an INR check Saturday at [facility] (if unable, to be done no later than Monday) -Lovenox bridge- 60mg  subQ q24h. This can be stopped when INR >2 -Daily INR while here  -Follow for s/s bleeding   No Known Allergies  Patient Measurements: Height: 5\' 6"  (167.6 cm) Weight: 141 lb 8.6 oz (64.2 kg) IBW/kg (Calculated) : 59.3  Vital Signs: Temp: 97.8 F (36.6 C) (05/09 0804) Temp Source: Oral (05/09 1220) BP: 165/71 (05/09 1220) Pulse Rate: 72 (05/09 1220)    Labs: Recent Labs    08/30/17 0718 08/31/17 0410 09/01/17 0514 09/01/17 1109  HGB  --  9.1*  --  8.4*  HCT  --  29.9*  --  27.2*  PLT  --  212  --  228  LABPROT 13.3 13.6 16.4*  --   INR 1.02 1.05 1.33  --   CREATININE  --   --   --  2.82*    Estimated Creatinine Clearance: 14.6 mL/min (A) (by C-G formula based on SCr of 2.82 mg/dL (H)).   Glenda [name] D. Glenda [name], PharmD, BCPS Clinical Pharmacist Clinical Phone for 09/01/2017 until 3:30pm: [phone] If after 3:30pm, please call main pharmacy at [facility] 09/01/2017 1:45 PM

## 2017-09-01 NOTE — Discharge Summary (Signed)
PATIENT DETAILS Name: Glenda [name] Age: 84 y.o. Sex: female Date of Birth: November 06, 1933 MRN: [id number]. Admitting Physician: Velvet Bathe, MD [id number], Elaina Pattee, MD  Admit Date: 08/20/2017 Discharge date: 09/01/2017  Recommendations for Outpatient Follow-up:  1. Follow up with PCP in 1-2 weeks 2. Please obtain BMP/CBC in one week 3. Please check INR in the next 2 days-if INR is more than 2, stop Lovenox-continue to adjust dosing of Coumadin accordingly.  Admitted From:  Home   Disposition: [place]: No  Equipment/Devices: None  Discharge Condition: Stable  CODE STATUS: FULL CODE  Diet recommendation:  Heart Healthy / Carb Modified   Brief Summary: See H&P, Labs, Consult and Test reports for all details in brief, Patient is a 84 y.o. female with prior history of DM-2, hypertension, chronic kidney disease stage IV, chronic diastolic heart failure presented to the hospital with acute hypoxic respiratory failure secondary to acute on chronic diastolic heart failure and non-STEMI, hospital course completed by A. fib with RVR, persistent volume overload in spite of optimal diuretic therapy, subsequently seen by nephrology and initiated on hemodialysis.  See below for further details   Brief Hospital Course: Progressive chronic kidney disease stage IV-now ESRD: As noted above-very poor response to diuretics-subsequently evaluated by nephrology-temporary HD catheter inserted on 5/2 and started on hemodialysis.  Underwent dialysis at the discretion of nephrology.  Per social work/case management-patient has a outpatient spot for dialysis-starting this Saturday.  Discussed with Dr. Inge Rise to discharge home  Acute hypoxic respiratory failure: Secondary to pulmonary edema/acute on chronic diastolic heart failure.  Resolved following initiation of hemodialysis.  Currently on room air.  Acute on chronic diastolic heart failure:As noted she above-poor response  diuretics-and has been started on HD.  Volume is being managed through HD.  Elevated troponin due to demand ischemia: Cardiology following-recommendations are to continue with medical management-now only on aspirin (previously on Plavix but discontinued) -avoiding dual antiplatelet agent as patient is also on anticoagulation.  Atrial fibrillation: 2 brief episodes of RVR noted during this hospital stay-continue metoprolol-INR still subtherapeutic-currently on overlapping Lovenox and Coumadin.  Of asked patient to follow-up with her primary care practitioner in the next 2 days to check a INR-if INR is more than 2, Lovenox can be discontinued.  PCP to optimize dosing of Coumadin to keep INR between 2 and 3.  Hypertension: controlled-continue Imdur, hydralazine, clonidine and beta-blocker.  DM-2: CBG stable -of insulin.  Prior history of CVA: Continue aspirin/statin and Coumadin at this time.  Follow  Anemia: Secondary to ESRD/chronic kidney disease-defer to nephrology  Breast cancer: In remission-follow with primary oncologist as previous.  Procedures/Studies: 5/2>> temporary HD catheter 5/2>> tunneled HD catheter  Discharge Diagnoses:  Principal Problem:   Acute renal failure superimposed on chronic kidney disease ([place]) Active Problems:   Uncontrolled hypertension   CAD (coronary artery disease)   GASTROESOPHAGEAL REFLUX DISEASE   Acute on chronic diastolic CHF (congestive heart failure) (HCC)   Essential hypertension   History of breast cancer   Type II diabetes mellitus with neurological manifestations, uncontrolled (HCC)   Acute respiratory failure with hypoxia (HCC)   DM (diabetes mellitus), type 2, uncontrolled, with renal complications (HCC)   PAF (paroxysmal atrial fibrillation) ([place])   Discharge Instructions:  Activity:  As tolerated with Full fall precautions use walker/cane & assistance as needed  Discharge Instructions    Call MD for:  difficulty breathing,  headache or visual disturbances   Complete by:  As directed  Diet - low sodium heart healthy   Complete by:  As directed    Diet Carb Modified   Complete by:  As directed    Discharge instructions   Complete by:  As directed    Follow with Primary MD  Angelica Pou, MD this coming Saturday for INR check.  Once your INR is more than 2, you can stop Lovenox injections and just continue on Coumadin.    Please follow at your new hemodialysis center starting this Saturday.   Please get a complete blood count and chemistry panel checked by your Primary MD at your next visit, and again as instructed by your Primary MD.  Get Medicines reviewed and adjusted: Please take all your medications with you for your next visit with your Primary MD  Laboratory/radiological data: Please request your Primary MD to go over all hospital tests and procedure/radiological results at the follow up, please ask your Primary MD to get all Hospital records sent to his/her office.  In some cases, they will be blood work, cultures and biopsy results pending at the time of your discharge. Please request that your primary care M.D. follows up on these results.  Also Note the following: If you experience worsening of your admission symptoms, develop shortness of breath, life threatening emergency, suicidal or homicidal thoughts you must seek medical attention immediately by calling 911 or calling your MD immediately  if symptoms less severe.  You must read complete instructions/literature along with all the possible adverse reactions/side effects for all the Medicines you take and that have been prescribed to you. Take any new Medicines after you have completely understood and accpet all the possible adverse reactions/side effects.   Do not drive when taking Pain medications or sleeping medications (Benzodaizepines)  Do not take more than prescribed Pain, Sleep and Anxiety Medications. It is not advisable to  combine anxiety,sleep and pain medications without talking with your primary care practitioner  Special Instructions: If you have smoked or chewed Tobacco  in the last 2 yrs please stop smoking, stop any regular Alcohol  and or any Recreational drug use.  Wear Seat belts while driving.  Please note: You were cared for by a hospitalist during your hospital stay. Once you are discharged, your primary care physician will handle any further medical issues. Please note that NO REFILLS for any discharge medications will be authorized once you are discharged, as it is imperative that you return to your primary care physician (or establish a relationship with a primary care physician if you do not have one) for your post hospital discharge needs so that they can reassess your need for medications and monitor your lab values.   Increase activity slowly   Complete by:  As directed      Allergies as of 09/01/2017   No Known Allergies     Medication List    STOP taking these medications   amLODipine 10 MG tablet Commonly known as:  NORVASC   carvedilol 12.5 MG tablet Commonly known as:  COREG   clopidogrel 75 MG tablet Commonly known as:  PLAVIX   metolazone 2.5 MG tablet Commonly known as:  ZAROXOLYN   potassium chloride SA 20 MEQ tablet Commonly known as:  K-DUR,KLOR-CON   torsemide 100 MG tablet Commonly known as:  DEMADEX     TAKE these medications   acetaminophen 500 MG tablet Commonly known as:  TYLENOL Take 1,000 mg by mouth daily as needed for moderate pain or headache.  aspirin EC 81 MG tablet Take 81 mg by mouth daily.   cetirizine 10 MG tablet Commonly known as:  ZYRTEC Take 10 mg daily by mouth.   citalopram 20 MG tablet Commonly known as:  CELEXA Take 20 mg by mouth daily.   cloNIDine 0.2 MG tablet Commonly known as:  CATAPRES Take 0.2 mg by mouth 2 (two) times daily.   enoxaparin 60 MG/0.6ML injection Commonly known as:  LOVENOX Inject 0.6 mLs (60 mg  total) into the skin daily.   famotidine 10 MG chewable tablet Commonly known as:  PEPCID AC Chew 10 mg by mouth daily as needed for heartburn.   feeding supplement (NEPRO CARB STEADY) Liqd Take 237 mLs by mouth 2 (two) times daily between meals.   GAS-X 80 MG chewable tablet Generic drug:  simethicone Chew 80 mg by mouth every 6 (six) hours as needed for flatulence.   HUMALOG MIX 50/50 KWIKPEN (50-50) 100 UNIT/ML Kwikpen Generic drug:  Insulin Lispro Prot & Lispro Inject 18-22 Units into the skin 2 (two) times daily. Inject 22 units in the morning and 18 units at bedtime   hydrALAZINE 25 MG tablet Commonly known as:  APRESOLINE Take 1 tablet (25 mg total) every 8 (eight) hours by mouth.   isosorbide mononitrate 30 MG 24 hr tablet Commonly known as:  IMDUR Take 1 tablet (30 mg total) daily by mouth.   metoprolol succinate 25 MG 24 hr tablet Commonly known as:  TOPROL-XL Take 1 tablet (25 mg total) by mouth daily.   oxyCODONE-acetaminophen 5-325 MG tablet Commonly known as:  PERCOCET Take 1 tablet by mouth every 8 (eight) hours as needed for severe pain.   pantoprazole 40 MG tablet Commonly known as:  PROTONIX Take 40 mg by mouth daily.   pravastatin 80 MG tablet Commonly known as:  PRAVACHOL Take 80 mg by mouth daily.   warfarin 5 MG tablet Commonly known as:  COUMADIN Take 1.5 tablets (7.5 mg total) by mouth one time only at 6 PM.      Follow-up Information    Angelica Pou, MD Follow up.   Specialty:  Internal Medicine Why:  Follow-up at your primary care practitioner's office this coming Saturday on 5/11 for a follow-up visit to check your INR. Contact information: [place] [address] [place], [facility].   Why:  Tuesday, Thursday, Saturday Contact information: [address] [place] [address] [phone]          No Known Allergies   Consultations: cardiology and  nephrology  Interventional radiology  Other Procedures/Studies: Dg Chest 2 View  Result Date: 08/22/2017 CLINICAL DATA:  CHF with shortness of breath EXAM: CHEST - 2 VIEW COMPARISON:  08/22/2017, 08/20/2017, 03/06/2017 FINDINGS: Low lung volumes. Tiny pleural effusions. Mild cardiomegaly with vascular congestion, overall decreased. Decreased pulmonary edema with minimal residual. Aortic atherosclerosis. No pneumothorax. IMPRESSION: 1. Low lung volumes. 2. Cardiomegaly with vascular congestion and minimal edema, overall improved since prior radiograph 3. Small pleural effusions. Electronically Signed   By: Donavan Foil M.D.   On: 08/22/2017 20:00   Ir Fluoro Guide Cv Line Right  Result Date: 08/29/2017 INDICATION: End-stage renal disease. Please convert temporary dialysis catheter to a tunneled/permanent dialysis catheter for the continuation of dialysis. EXAM: FLUOROSCOPIC GUIDED CONVERSION OF NON TUNNELED TO TUNNELED CENTRAL VENOUS HEMODIALYSIS CATHETER COMPARISON:  Ultrasound and fluoroscopic guided placement of a non tunneled temporary dialysis catheter - 08/25/2017 MEDICATIONS:  Ancef 2 g IV; the antibiotic was administered with an appropriate time frame prior to the initiation of the procedure. ANESTHESIA/SEDATION: Moderate (conscious) sedation was employed during this procedure. A total of Versed 1 mg and Fentanyl 50 mcg was administered intravenously. Moderate Sedation Time: 14 minutes. The patient's level of consciousness and vital signs were monitored continuously by radiology nursing throughout the procedure under my direct supervision. FLUOROSCOPY TIME:  36 seconds (5 mGy) COMPLICATIONS: None immediate. PROCEDURE: Informed written consent was obtained from the patient after a discussion of the risks, benefits, and alternatives to treatment. Questions regarding the procedure were encouraged and answered. The skin and external portion of the existing hemodialysis catheter was prepped with  chlorhexidine in a sterile fashion, and a sterile drape was applied covering the operative field. Maximum barrier sterile technique with sterile gowns and gloves were used for the procedure. A timeout was performed prior to the initiation of the procedure. A lumen of the none tunneled temporary dialysis catheter was cannulated with a stiff Glidewire in utilized for measurement purposes. Next, the Glidewire was advanced to the level of the IVC. A 19 cm tip to cuff palindrome dialysis catheter was tunneled from a site along the anterior chest to the venotomy site. Under intermittent fluoroscopic guidance, the temporary dialysis catheter was exchanged for a peel-away sheath. The tunneled dialysis catheter was inserted into the peel-away sheath with tips open terminating within in the superior aspect the right atrium. Final catheter positioning was confirmed and documented with a spot fluoroscopic image. The catheter aspirates and flushes normally. The catheter was flushed with appropriate volume heparin dwells. The catheter exit site was secured with a 0-Prolene retention sutures. The venotomy site was apposed with an interrupted 4 0 Vicryl suture, derma bond and Steri-Strips. Both lumens were heparinized. A dressing was placed. The patient tolerated the procedure well without immediate post procedural complication. IMPRESSION: Successful fluoroscopic guided conversion of a temporary to a permanent 19 cm tip to cuff tunneled hemodialysis catheter with tips terminating within the right atrium. The catheter is ready for immediate use. Electronically Signed   By: Sandi Mariscal M.D.   On: 08/29/2017 09:35   Ir Fluoro Guide Cv Line Right  Result Date: 08/25/2017 INDICATION: 84 year old female with a history renal failure. EXAM: IMAGE GUIDED TEMPORARY HEMODIALYSIS CATHETER PLACEMENT MEDICATIONS: None ANESTHESIA/SEDATION: None FLUOROSCOPY TIME:  Fluoroscopy Time: 0 minutes 6 seconds (1.8 mGy). COMPLICATIONS: None PROCEDURE:  Informed written consent was obtained from the patient and the patient's family after a thorough discussion of the procedural risks, benefits and alternatives. All questions were addressed. A timeout was performed prior to the initiation of the procedure. The right neck and chest was prepped with chlorhexidine, and draped in the usual sterile fashion using maximum barrier technique (cap and mask, sterile gown, sterile gloves, large sterile sheet, hand hygiene and cutaneous antiseptic). Local anesthesia was attained by infiltration with 1% lidocaine without epinephrine. Ultrasound demonstrated patency of the right internal jugular vein, and this was documented with an image. Under real-time ultrasound guidance, this vein was accessed with a 21 gauge micropuncture needle and image documentation was performed. A small dermatotomy was made at the access site with an 11 scalpel. A 0.018" wire was advanced into the SVC and the access needle exchanged for a 54F micropuncture vascular sheath. The 0.018" wire was then removed and a 0.035" wire advanced into the IVC. Upon withdrawal of the 018 wire, the wire was marked for appropriate length of the internal portion of the catheter.  A 19 cm catheter was selected. Skin and subcutaneous tissues were serially dilated. Catheter was placed on the wire. The catheter tip is positioned in the upper right atrium. This was documented with a spot image. Both ports of the hemodialysis catheter were then tested for excellent function. The ports were then locked with heparinized lock. Patient tolerated the procedure well and remained hemodynamically stable throughout. No complications were encountered and no significant blood loss was encountered. IMPRESSION: Status post right IJ temporary HD catheter placement. Catheter ready for use. Catheter may be converted if required. Signed, Dulcy Fanny. Earleen Newport, DO [facility] Electronically  Signed   By: Corrie Mckusick D.O.   On: 08/25/2017 12:10   Ir US Guide Vasc Access Right  Result Date: 08/25/2017 INDICATION: 84 year old female with a history renal failure. EXAM: IMAGE GUIDED TEMPORARY HEMODIALYSIS CATHETER PLACEMENT MEDICATIONS: None ANESTHESIA/SEDATION: None FLUOROSCOPY TIME:  Fluoroscopy Time: 0 minutes 6 seconds (1.8 mGy). COMPLICATIONS: None PROCEDURE: Informed written consent was obtained from the patient and the patient's family after a thorough discussion of the procedural risks, benefits and alternatives. All questions were addressed. A timeout was performed prior to the initiation of the procedure. The right neck and chest was prepped with chlorhexidine, and draped in the usual sterile fashion using maximum barrier technique (cap and mask, sterile gown, sterile gloves, large sterile sheet, hand hygiene and cutaneous antiseptic). Local anesthesia was attained by infiltration with 1% lidocaine without epinephrine. Ultrasound demonstrated patency of the right internal jugular vein, and this was documented with an image. Under real-time ultrasound guidance, this vein was accessed with a 21 gauge micropuncture needle and image documentation was performed. A small dermatotomy was made at the access site with an 11 scalpel. A 0.018" wire was advanced into the SVC and the access needle exchanged for a 67F micropuncture vascular sheath. The 0.018" wire was then removed and a 0.035" wire advanced into the IVC. Upon withdrawal of the 018 wire, the wire was marked for appropriate length of the internal portion of the catheter. A 19 cm catheter was selected. Skin and subcutaneous tissues were serially dilated. Catheter was placed on the wire. The catheter tip is positioned in the upper right atrium. This was documented with a spot image. Both ports of the hemodialysis catheter were then tested for excellent function. The ports were then locked with heparinized lock. Patient tolerated the procedure  well and remained hemodynamically stable throughout. No complications were encountered and no significant blood loss was encountered. IMPRESSION: Status post right IJ temporary HD catheter placement. Catheter ready for use. Catheter may be converted if required. Signed, Dulcy Fanny. Earleen Newport, DO [facility] Electronically Signed   By: Corrie Mckusick D.O.   On: 08/25/2017 12:10   Dg Chest Port 1 View  Result Date: 08/24/2017 CLINICAL DATA:  Shortness of breath EXAM: PORTABLE CHEST 1 VIEW COMPARISON:  08/22/2017 FINDINGS: Cardiomegaly. Diffuse interstitial prominence. Perihilar and lower lobe opacities, left greater than right. Findings have worsened since prior study, likely related to edema. No visible significant effusions or acute bony abnormality. IMPRESSION: Increasing interstitial prominence and perihilar and lower lobe opacities, left greater than right, most likely edema/CHF. Electronically Signed   By: Rolm Baptise M.D.   On: 08/24/2017 10:03   Dg Chest Port 1 View  Result Date: 08/22/2017 CLINICAL DATA:  Shortness of breath. EXAM: PORTABLE CHEST 1 VIEW COMPARISON:  Radiograph of August 20, 2017. FINDINGS: Stable cardiomegaly with bilateral pulmonary edema.  Atherosclerosis of thoracic aorta is noted. No pneumothorax or pleural effusion is noted. Bony thorax is unremarkable. IMPRESSION: Stable cardiomegaly with bilateral pulmonary edema. Aortic Atherosclerosis (ICD10-I70.0). Electronically Signed   By: Marijo Conception, M.D.   On: 08/22/2017 07:22   Dg Chest Port 1 View  Result Date: 08/20/2017 CLINICAL DATA:  Shortness of breath since yesterday.  Arm pain. EXAM: PORTABLE CHEST 1 VIEW COMPARISON:  March 06, 2017 FINDINGS: Stable mild cardiomegaly. No pneumothorax. Moderate pulmonary edema. No other changes. IMPRESSION: Cardiomegaly and moderate pulmonary edema. Electronically Signed   By: Dorise Bullion III M.D   On: 08/20/2017 09:08       TODAY-DAY OF DISCHARGE:  Subjective:   [name] today has no headache,no chest abdominal pain,no new weakness tingling or numbness, feels much better wants to go home today.   Objective:   Blood pressure (!) 165/71, pulse 72, temperature 97.8 F (36.6 C), temperature source Oral, resp. rate 19, height 5\' 6"  (1.676 m), weight 64.2 kg (141 lb 8.6 oz), SpO2 98 %.  Intake/Output Summary (Last 24 hours) at 09/01/2017 1437 Last data filed at 09/01/2017 1220 Gross per 24 hour  Intake 0 ml  Output 3000 ml  Net -3000 ml   Filed Weights   09/01/17 0500 09/01/17 0804 09/01/17 1220  Weight: 66 kg (145 lb 8.1 oz) 68.1 kg (150 lb 2.1 oz) 64.2 kg (141 lb 8.6 oz)    Exam: Awake Alert, Oriented *3, No new F.N deficits, Normal affect [place].AT,PERRAL Supple Neck,No JVD, No cervical lymphadenopathy appriciated.  Symmetrical Chest wall movement, Good air movement bilaterally, CTAB RRR,No Gallops,Rubs or new Murmurs, No Parasternal Heave +ve B.Sounds, Abd Soft, Non tender, No organomegaly appriciated, No rebound -guarding or rigidity. No Cyanosis, Clubbing or edema, No new Rash or bruise   PERTINENT RADIOLOGIC STUDIES: Dg Chest 2 View  Result Date: 08/22/2017 CLINICAL DATA:  CHF with shortness of breath EXAM: CHEST - 2 VIEW COMPARISON:  08/22/2017, 08/20/2017, 03/06/2017 FINDINGS: Low lung volumes. Tiny pleural effusions. Mild cardiomegaly with vascular congestion, overall decreased. Decreased pulmonary edema with minimal residual. Aortic atherosclerosis. No pneumothorax. IMPRESSION: 1. Low lung volumes. 2. Cardiomegaly with vascular congestion and minimal edema, overall improved since prior radiograph 3. Small pleural effusions. Electronically Signed   By: Donavan Foil M.D.   On: 08/22/2017 20:00   Ir Fluoro Guide Cv Line Right  Result Date: 08/29/2017 INDICATION: End-stage renal disease. Please convert temporary dialysis catheter to a tunneled/permanent dialysis catheter for the  continuation of dialysis. EXAM: FLUOROSCOPIC GUIDED CONVERSION OF NON TUNNELED TO TUNNELED CENTRAL VENOUS HEMODIALYSIS CATHETER COMPARISON:  Ultrasound and fluoroscopic guided placement of a non tunneled temporary dialysis catheter - 08/25/2017 MEDICATIONS: Ancef 2 g IV; the antibiotic was administered with an appropriate time frame prior to the initiation of the procedure. ANESTHESIA/SEDATION: Moderate (conscious) sedation was employed during this procedure. A total of Versed 1 mg and Fentanyl 50 mcg was administered intravenously. Moderate Sedation Time: 14 minutes. The patient's level of consciousness and vital signs were monitored continuously by radiology nursing throughout the procedure under my direct supervision. FLUOROSCOPY TIME:  36 seconds (5 mGy) COMPLICATIONS: None immediate. PROCEDURE: Informed written consent was obtained from the patient after a discussion of the risks, benefits, and alternatives to treatment. Questions regarding the procedure were encouraged and answered. The skin and external portion of the existing hemodialysis catheter was prepped with chlorhexidine in a sterile fashion, and a sterile drape was applied covering the operative field. Maximum barrier sterile technique with sterile gowns  and gloves were used for the procedure. A timeout was performed prior to the initiation of the procedure. A lumen of the none tunneled temporary dialysis catheter was cannulated with a stiff Glidewire in utilized for measurement purposes. Next, the Glidewire was advanced to the level of the IVC. A 19 cm tip to cuff palindrome dialysis catheter was tunneled from a site along the anterior chest to the venotomy site. Under intermittent fluoroscopic guidance, the temporary dialysis catheter was exchanged for a peel-away sheath. The tunneled dialysis catheter was inserted into the peel-away sheath with tips open terminating within in the superior aspect the right atrium. Final catheter positioning was  confirmed and documented with a spot fluoroscopic image. The catheter aspirates and flushes normally. The catheter was flushed with appropriate volume heparin dwells. The catheter exit site was secured with a 0-Prolene retention sutures. The venotomy site was apposed with an interrupted 4 0 Vicryl suture, derma bond and Steri-Strips. Both lumens were heparinized. A dressing was placed. The patient tolerated the procedure well without immediate post procedural complication. IMPRESSION: Successful fluoroscopic guided conversion of a temporary to a permanent 19 cm tip to cuff tunneled hemodialysis catheter with tips terminating within the right atrium. The catheter is ready for immediate use. Electronically Signed   By: Sandi Mariscal M.D.   On: 08/29/2017 09:35   Ir Fluoro Guide Cv Line Right  Result Date: 08/25/2017 INDICATION: 84 year old female with a history renal failure. EXAM: IMAGE GUIDED TEMPORARY HEMODIALYSIS CATHETER PLACEMENT MEDICATIONS: None ANESTHESIA/SEDATION: None FLUOROSCOPY TIME:  Fluoroscopy Time: 0 minutes 6 seconds (1.8 mGy). COMPLICATIONS: None PROCEDURE: Informed written consent was obtained from the patient and the patient's family after a thorough discussion of the procedural risks, benefits and alternatives. All questions were addressed. A timeout was performed prior to the initiation of the procedure. The right neck and chest was prepped with chlorhexidine, and draped in the usual sterile fashion using maximum barrier technique (cap and mask, sterile gown, sterile gloves, large sterile sheet, hand hygiene and cutaneous antiseptic). Local anesthesia was attained by infiltration with 1% lidocaine without epinephrine. Ultrasound demonstrated patency of the right internal jugular vein, and this was documented with an image. Under real-time ultrasound guidance, this vein was accessed with a 21 gauge micropuncture needle and image documentation was performed. A small dermatotomy was made at the  access site with an 11 scalpel. A 0.018" wire was advanced into the SVC and the access needle exchanged for a 31F micropuncture vascular sheath. The 0.018" wire was then removed and a 0.035" wire advanced into the IVC. Upon withdrawal of the 018 wire, the wire was marked for appropriate length of the internal portion of the catheter. A 19 cm catheter was selected. Skin and subcutaneous tissues were serially dilated. Catheter was placed on the wire. The catheter tip is positioned in the upper right atrium. This was documented with a spot image. Both ports of the hemodialysis catheter were then tested for excellent function. The ports were then locked with heparinized lock. Patient tolerated the procedure well and remained hemodynamically stable throughout. No complications were encountered and no significant blood loss was encountered. IMPRESSION: Status post right IJ temporary HD catheter placement. Catheter ready for use. Catheter may be converted if required. Signed, Dulcy Fanny. Earleen Newport, DO [facility] Radiology Electronically Signed   By: Corrie Mckusick D.O.   On: 08/25/2017 12:10   Ir US Guide Vasc Access Right  Result Date: 08/25/2017 INDICATION: 84 year old female with a history renal  failure. EXAM: IMAGE GUIDED TEMPORARY HEMODIALYSIS CATHETER PLACEMENT MEDICATIONS: None ANESTHESIA/SEDATION: None FLUOROSCOPY TIME:  Fluoroscopy Time: 0 minutes 6 seconds (1.8 mGy). COMPLICATIONS: None PROCEDURE: Informed written consent was obtained from the patient and the patient's family after a thorough discussion of the procedural risks, benefits and alternatives. All questions were addressed. A timeout was performed prior to the initiation of the procedure. The right neck and chest was prepped with chlorhexidine, and draped in the usual sterile fashion using maximum barrier technique (cap and mask, sterile gown, sterile gloves, large sterile sheet, hand hygiene and cutaneous  antiseptic). Local anesthesia was attained by infiltration with 1% lidocaine without epinephrine. Ultrasound demonstrated patency of the right internal jugular vein, and this was documented with an image. Under real-time ultrasound guidance, this vein was accessed with a 21 gauge micropuncture needle and image documentation was performed. A small dermatotomy was made at the access site with an 11 scalpel. A 0.018" wire was advanced into the SVC and the access needle exchanged for a 47F micropuncture vascular sheath. The 0.018" wire was then removed and a 0.035" wire advanced into the IVC. Upon withdrawal of the 018 wire, the wire was marked for appropriate length of the internal portion of the catheter. A 19 cm catheter was selected. Skin and subcutaneous tissues were serially dilated. Catheter was placed on the wire. The catheter tip is positioned in the upper right atrium. This was documented with a spot image. Both ports of the hemodialysis catheter were then tested for excellent function. The ports were then locked with heparinized lock. Patient tolerated the procedure well and remained hemodynamically stable throughout. No complications were encountered and no significant blood loss was encountered. IMPRESSION: Status post right IJ temporary HD catheter placement. Catheter ready for use. Catheter may be converted if required. Signed, Dulcy Fanny. Earleen Newport, DO [facility] Radiology Electronically Signed   By: Corrie Mckusick D.O.   On: 08/25/2017 12:10   Dg Chest Port 1 View  Result Date: 08/24/2017 CLINICAL DATA:  Shortness of breath EXAM: PORTABLE CHEST 1 VIEW COMPARISON:  08/22/2017 FINDINGS: Cardiomegaly. Diffuse interstitial prominence. Perihilar and lower lobe opacities, left greater than right. Findings have worsened since prior study, likely related to edema. No visible significant effusions or acute bony abnormality. IMPRESSION: Increasing interstitial  prominence and perihilar and lower lobe opacities, left greater than right, most likely edema/CHF. Electronically Signed   By: Rolm Baptise M.D.   On: 08/24/2017 10:03   Dg Chest Port 1 View  Result Date: 08/22/2017 CLINICAL DATA:  Shortness of breath. EXAM: PORTABLE CHEST 1 VIEW COMPARISON:  Radiograph of August 20, 2017. FINDINGS: Stable cardiomegaly with bilateral pulmonary edema. Atherosclerosis of thoracic aorta is noted. No pneumothorax or pleural effusion is noted. Bony thorax is unremarkable. IMPRESSION: Stable cardiomegaly with bilateral pulmonary edema. Aortic Atherosclerosis (ICD10-I70.0). Electronically Signed   By: Marijo Conception, M.D.   On: 08/22/2017 07:22   Dg Chest Port 1 View  Result Date: 08/20/2017 CLINICAL DATA:  Shortness of breath since yesterday.  Arm pain. EXAM: PORTABLE CHEST 1 VIEW COMPARISON:  March 06, 2017 FINDINGS: Stable mild cardiomegaly. No pneumothorax. Moderate pulmonary edema. No other changes. IMPRESSION: Cardiomegaly and moderate pulmonary edema. Electronically Signed   By: Dorise Bullion III M.D   On: 08/20/2017 09:08     PERTINENT LAB RESULTS: CBC: Recent Labs    08/31/17 0410 09/01/17 1109  WBC 6.3 5.1  HGB 9.1* 8.4*  HCT 29.9* 27.2*  PLT 212 228  CMET CMP     Component Value Date/Time   NA 138 09/01/2017 1109   NA 140 09/16/2015   NA 141 05/07/2015 1251   K 4.1 09/01/2017 1109   K 4.3 05/07/2015 1251   CL 100 (L) 09/01/2017 1109   CO2 30 09/01/2017 1109   CO2 22 05/07/2015 1251   GLUCOSE 164 (H) 09/01/2017 1109   GLUCOSE 110 05/07/2015 1251   BUN 30 (H) 09/01/2017 1109   BUN 34 (A) 09/16/2015   BUN 22.5 05/07/2015 1251   CREATININE 2.82 (H) 09/01/2017 1109   CREATININE 1.2 (H) 05/07/2015 1251   CALCIUM 8.4 (L) 09/01/2017 1109   CALCIUM 9.1 05/07/2015 1251   PROT 6.5 08/24/2017 0525   PROT 7.1 05/07/2015 1251   ALBUMIN 2.7 (L) 09/01/2017 1109   ALBUMIN 3.3 (L) 05/07/2015 1251   AST 48 (H) 08/24/2017 0525   AST 15  05/07/2015 1251   ALT 16 08/25/2017 2340   ALT <9 05/07/2015 1251   ALKPHOS 117 08/24/2017 0525   ALKPHOS 95 05/07/2015 1251   BILITOT 0.4 08/24/2017 0525   BILITOT 0.30 05/07/2015 1251   GFRNONAA 15 (L) 09/01/2017 1109   GFRAA 17 (L) 09/01/2017 1109    GFR Estimated Creatinine Clearance: 14.6 mL/min (A) (by C-G formula based on SCr of 2.82 mg/dL (H)). No results for input(s): LIPASE, AMYLASE in the last 72 hours. No results for input(s): CKTOTAL, CKMB, CKMBINDEX, TROPONINI in the last 72 hours. Invalid input(s): POCBNP No results for input(s): DDIMER in the last 72 hours. No results for input(s): HGBA1C in the last 72 hours. No results for input(s): CHOL, HDL, LDLCALC, TRIG, CHOLHDL, LDLDIRECT in the last 72 hours. No results for input(s): TSH, T4TOTAL, T3FREE, THYROIDAB in the last 72 hours.  Invalid input(s): FREET3 No results for input(s): VITAMINB12, FOLATE, FERRITIN, TIBC, IRON, RETICCTPCT in the last 72 hours. Coags: Recent Labs    08/31/17 0410 09/01/17 0514  INR 1.05 1.33   Microbiology: No results found for this or any previous visit (from the past 240 hour(s)).  FURTHER DISCHARGE INSTRUCTIONS:  Get Medicines reviewed and adjusted: Please take all your medications with you for your next visit with your Primary MD  Laboratory/radiological data: Please request your Primary MD to go over all hospital tests and procedure/radiological results at the follow up, please ask your Primary MD to get all Hospital records sent to his/her office.  In some cases, they will be blood work, cultures and biopsy results pending at the time of your discharge. Please request that your primary care M.D. goes through all the records of your hospital data and follows up on these results.  Also Note the following: If you experience worsening of your admission symptoms, develop shortness of breath, life threatening emergency, suicidal or homicidal thoughts you must seek medical attention  immediately by calling 911 or calling your MD immediately  if symptoms less severe.  You must read complete instructions/literature along with all the possible adverse reactions/side effects for all the Medicines you take and that have been prescribed to you. Take any new Medicines after you have completely understood and accpet all the possible adverse reactions/side effects.   Do not drive when taking Pain medications or sleeping medications (Benzodaizepines)  Do not take more than prescribed Pain, Sleep and Anxiety Medications. It is not advisable to combine anxiety,sleep and pain medications without talking with your primary care practitioner  Special Instructions: If you have smoked or chewed Tobacco  in the last 2 yrs please  stop smoking, stop any regular Alcohol  and or any Recreational drug use.  Wear Seat belts while driving.  Please note: You were cared for by a hospitalist during your hospital stay. Once you are discharged, your primary care physician will handle any further medical issues. Please note that NO REFILLS for any discharge medications will be authorized once you are discharged, as it is imperative that you return to your primary care physician (or establish a relationship with a primary care physician if you do not have one) for your post hospital discharge needs so that they can reassess your need for medications and monitor your lab values.  Total Time spent coordinating discharge including counseling, education and face to face time equals 35 minutes.  SignedOren Binet 09/01/2017 2:37 PM

## 2017-09-05 ENCOUNTER — Encounter (HOSPITAL_COMMUNITY): Payer: Medicare (Managed Care)

## 2017-09-05 ENCOUNTER — Ambulatory Visit: Payer: Medicare (Managed Care) | Admitting: Surgery

## 2017-09-26 ENCOUNTER — Ambulatory Visit (INDEPENDENT_AMBULATORY_CARE_PROVIDER_SITE_OTHER): Payer: Medicare (Managed Care) | Admitting: Surgery

## 2017-09-26 ENCOUNTER — Ambulatory Visit (HOSPITAL_COMMUNITY)
Admission: RE | Admit: 2017-09-26 | Discharge: 2017-09-26 | Disposition: A | Discharge status: Home / Self Care | Payer: Medicare (Managed Care) | Source: Ambulatory Visit | Attending: Surgery | Admitting: Surgery

## 2017-09-26 ENCOUNTER — Encounter: Payer: Self-pay | Admitting: *Deleted

## 2017-09-26 ENCOUNTER — Encounter: Payer: Self-pay | Admitting: Surgery

## 2017-09-26 VITALS — BP 156/63 | HR 75 | Temp 97.7°F | Resp 17 | Ht 66.0 in | Wt 139.6 lb

## 2017-09-26 DIAGNOSIS — N185 Chronic kidney disease, stage 5: Secondary | ICD-10-CM

## 2017-09-26 DIAGNOSIS — N186 End stage renal disease: Secondary | ICD-10-CM

## 2017-09-26 DIAGNOSIS — Z992 Dependence on renal dialysis: Secondary | ICD-10-CM

## 2017-09-26 NOTE — H&P (View-Only) (Signed)
Patient name: Glenda [name] MRN: [id number] DOB: [birth year] Sex: female  REASON FOR VISIT:     post op  HISTORY OF PRESENT ILLNESS:   Glenda [name] is a 84 y.o. female who returns today for follow-up.  She is status post first stage left basilic vein fistula creation on 07/14/2017.  She is on dialysis through a catheter on Tuesday Thursday Saturday.  She denies symptoms of steal.  CURRENT MEDICATIONS:    Current Outpatient Medications  Medication Sig Dispense Refill  . acetaminophen (TYLENOL) 500 MG tablet Take 1,000 mg by mouth daily as needed for moderate pain or headache.    Marland Kitchen aspirin EC 81 MG tablet Take 81 mg by mouth daily.    . cetirizine (ZYRTEC) 10 MG tablet Take 10 mg daily by mouth.    . citalopram (CELEXA) 20 MG tablet Take 20 mg by mouth daily.    . cloNIDine (CATAPRES) 0.2 MG tablet Take 0.2 mg by mouth 2 (two) times daily.     . famotidine (PEPCID AC) 10 MG chewable tablet Chew 10 mg by mouth daily as needed for heartburn.    Marland Kitchen HUMALOG MIX 50/50 KWIKPEN (50-50) 100 UNIT/ML Kwikpen Inject 18-22 Units into the skin 2 (two) times daily. Inject 22 units in the morning and 18 units at bedtime  11  . metoprolol succinate (TOPROL-XL) 25 MG 24 hr tablet Take 1 tablet (25 mg total) by mouth daily. 30 tablet 0  . Nutritional Supplements (FEEDING SUPPLEMENT, NEPRO CARB STEADY,) LIQD Take 237 mLs by mouth 2 (two) times daily between meals. 30 Can 0  . pantoprazole (PROTONIX) 40 MG tablet Take 40 mg by mouth daily.    . pravastatin (PRAVACHOL) 80 MG tablet Take 80 mg by mouth daily.    . simethicone (GAS-X) 80 MG chewable tablet Chew 80 mg by mouth every 6 (six) hours as needed for flatulence.    . warfarin (COUMADIN) 5 MG tablet Take 1.5 tablets (7.5 mg total) by mouth one time only at 6 PM. 30 tablet 0  . isosorbide mononitrate (IMDUR) 30 MG 24 hr tablet Take 1 tablet (30 mg total) daily by mouth. 30 tablet 0   No current facility-administered  medications for this visit.     REVIEW OF SYSTEMS:   [X]  denotes positive finding, [ ]  denotes negative finding Cardiac  Comments:  Chest pain or chest pressure:    Shortness of breath upon exertion:    Short of breath when lying flat:    Irregular heart rhythm:    Constitutional    Fever or chills:      PHYSICAL EXAM:   Vitals:   09/26/17 1543 09/26/17 1547  BP: (!) 160/67 (!) 156/63  Pulse: 75   Resp: 17   Temp: 97.7 F (36.5 C)   TempSrc: Oral   Weight: 139 lb 9.6 oz (63.3 kg)   Height: 5\' 6"  (1.676 m)     GENERAL: The patient is a well-nourished female, in no acute distress. The vital signs are documented above. CARDIOVASCULAR: There is a regular rate and rhythm. PULMONARY: Non-labored respirations Good thrill within fistula  STUDIES:   Duplex shows the vein measurements are 0.77-is 1.22 cm.   MEDICAL ISSUES:   The patient will be scheduled for a second stage basilic vein fistula elevation on Wednesday, June 19.  She will stop her Coumadin 5 days before the procedure.  The risks of the procedure discussed the patient all of her questions were answered.  Annamarie Major, MD [facility] of [facility] [phone] Pager [phone]

## 2017-09-26 NOTE — Progress Notes (Signed)
Patient name: Glenda [name] MRN: [id number] DOB: [birth year] Sex: female  REASON FOR VISIT:     post op  HISTORY OF PRESENT ILLNESS:   Glenda [name] is a 84 y.o. female who returns today for follow-up.  She is status post first stage left basilic vein fistula creation on 07/14/2017.  She is on dialysis through a catheter on Tuesday Thursday Saturday.  She denies symptoms of steal.  CURRENT MEDICATIONS:    Current Outpatient Medications  Medication Sig Dispense Refill  . acetaminophen (TYLENOL) 500 MG tablet Take 1,000 mg by mouth daily as needed for moderate pain or headache.    Marland Kitchen aspirin EC 81 MG tablet Take 81 mg by mouth daily.    . cetirizine (ZYRTEC) 10 MG tablet Take 10 mg daily by mouth.    . citalopram (CELEXA) 20 MG tablet Take 20 mg by mouth daily.    . cloNIDine (CATAPRES) 0.2 MG tablet Take 0.2 mg by mouth 2 (two) times daily.     . famotidine (PEPCID AC) 10 MG chewable tablet Chew 10 mg by mouth daily as needed for heartburn.    Marland Kitchen HUMALOG MIX 50/50 KWIKPEN (50-50) 100 UNIT/ML Kwikpen Inject 18-22 Units into the skin 2 (two) times daily. Inject 22 units in the morning and 18 units at bedtime  11  . metoprolol succinate (TOPROL-XL) 25 MG 24 hr tablet Take 1 tablet (25 mg total) by mouth daily. 30 tablet 0  . Nutritional Supplements (FEEDING SUPPLEMENT, NEPRO CARB STEADY,) LIQD Take 237 mLs by mouth 2 (two) times daily between meals. 30 Can 0  . pantoprazole (PROTONIX) 40 MG tablet Take 40 mg by mouth daily.    . pravastatin (PRAVACHOL) 80 MG tablet Take 80 mg by mouth daily.    . simethicone (GAS-X) 80 MG chewable tablet Chew 80 mg by mouth every 6 (six) hours as needed for flatulence.    . warfarin (COUMADIN) 5 MG tablet Take 1.5 tablets (7.5 mg total) by mouth one time only at 6 PM. 30 tablet 0  . isosorbide mononitrate (IMDUR) 30 MG 24 hr tablet Take 1 tablet (30 mg total) daily by mouth. 30 tablet 0   No current facility-administered  medications for this visit.     REVIEW OF SYSTEMS:   [X]  denotes positive finding, [ ]  denotes negative finding Cardiac  Comments:  Chest pain or chest pressure:    Shortness of breath upon exertion:    Short of breath when lying flat:    Irregular heart rhythm:    Constitutional    Fever or chills:      PHYSICAL EXAM:   Vitals:   09/26/17 1543 09/26/17 1547  BP: (!) 160/67 (!) 156/63  Pulse: 75   Resp: 17   Temp: 97.7 F (36.5 C)   TempSrc: Oral   Weight: 139 lb 9.6 oz (63.3 kg)   Height: 5\' 6"  (1.676 m)     GENERAL: The patient is a well-nourished female, in no acute distress. The vital signs are documented above. CARDIOVASCULAR: There is a regular rate and rhythm. PULMONARY: Non-labored respirations Good thrill within fistula  STUDIES:   Duplex shows the vein measurements are 0.77-is 1.22 cm.   MEDICAL ISSUES:   The patient will be scheduled for a second stage basilic vein fistula elevation on Wednesday, June 19.  She will stop her Coumadin 5 days before the procedure.  The risks of the procedure discussed the patient all of her questions were answered.  Annamarie Major, MD [facility] Special[facility] of [facility] [phone] Pager [phone]

## 2017-09-27 ENCOUNTER — Encounter: Payer: Self-pay | Admitting: *Deleted

## 2017-09-27 ENCOUNTER — Other Ambulatory Visit: Payer: Self-pay | Admitting: *Deleted

## 2017-09-27 ENCOUNTER — Encounter: Payer: Self-pay | Admitting: Surgery

## 2017-09-27 NOTE — Progress Notes (Signed)
Anticoag clearance form faxed to Dr. Jimmye Norman [facility]. Phone message left for nurse Kennyth Lose at [facility] for the same.

## 2017-09-27 NOTE — Progress Notes (Signed)
Spoke to [place] at [facility] with Dr. Jimmye Norman. They will handle the anticoag Coumadin hold and bridging if indicated for patient. Ask her to please fax something I could have scanned to chart.

## 2017-10-11 ENCOUNTER — Other Ambulatory Visit: Payer: Self-pay

## 2017-10-11 ENCOUNTER — Encounter (HOSPITAL_COMMUNITY): Payer: Self-pay | Admitting: *Deleted

## 2017-10-11 NOTE — Progress Notes (Signed)
Anesthesia Chart Review:  Pt is a same day work up   Case:  [id number] Date/Time:  10/12/17 0815   Procedure:  SECOND STAGE BASILIC VEIN TRANSPOSITION LEFT ARM (Left )   Anesthesia type:  Monitor Anesthesia Care   Pre-op diagnosis:  END STAGE RENAL DISEASE FOR HEMODIALYSIS ACCESS   Location:  [facility] OR ROOM 22 / [place] OR   Surgeon:  Serafina Mitchell, MD      DISCUSSION: - Pt is an 84 year old female with hx CAD (moderate disease LAD, CX; mild disease RCA by 2010 cath), CHF, PAF, HTN, DM, stroke x3, ESRD  - Hospitalized 4/27-09/01/17 for progression to ESRD (started on dialysis), acute hypoxic respiratory failure, acute on chronic diastolic HF, demand ischemia (elevated troponin), new onset atrial fibrillation (2 brief episodes of RVR; started on anticoagulation)  - Pt to hold coumadin 5 days prior to surgery   PROVIDERS: PCP is Angelica Pou, MD Cardiologist is Ena Dawley, MD   LABS: Will be obtained day of surgery    IMAGES:  1 view CXR 08/24/17: Increasing interstitial prominence and perihilar and lower lobe opacities, left greater than right, most likely edema/CHF.   EKG 08/24/17: Atrial fibrillation (97 bpm). ST & T wave abnormality, consider lateral ischemia   CV:  Echo 08/21/17:  - Left ventricle: The cavity size was normal. There was moderate concentric hypertrophy. Systolic function was normal. The estimated ejection fraction was in the range of 60% to 65%. Wall motion was normal; there were no regional wall motion abnormalities. Features are consistent with a pseudonormal left ventricular filling pattern, with concomitant abnormal relaxation and increased filling pressure (grade 2 diastolic dysfunction). Doppler parameters are consistent with elevated ventricular   end-diastolic filling pressure. - Mitral valve: Calcified annulus. Mildly thickened leaflets. There was moderate regurgitation. - Left atrium: The atrium was moderately dilated. - Right ventricle: The cavity  size was mildly dilated. Wall thickness was normal. Systolic function was normal. - Tricuspid valve: There was mild regurgitation. - Pulmonary arteries: Systolic pressure was moderately increased. PA peak pressure: 47 mm Hg (S). - Inferior vena cava: The vessel was dilated. The respirophasic diameter changes were blunted (< 50%), consistent with elevated central venous pressure. - Pericardium, extracardiac: There was no pericardial effusion.  Carotid duplex 07/05/14: Minimal amount of bilateral atherosclerotic plaque, left subjectively greater than right, not resulting in a hemodynamically significant stenosis.  Nuclear stress test 01/18/14: Low risk stress nuclear study with a fixed defect in the mid and basal inferior wall due to attenuation artifact and increased gut uptake. No ischemia noted. LV Ejection Fraction: 53%. LV Wall Motion: NL LV Function; NL Wall Motion  Cardiac cath 02/14/09: Nonobstructive CAD, unchanged from previous (see below). EF 70%, normal wall motion.   Cardiac cath 10/08/02:  1. Hypodynamic left ventricular function. 2. Scattered disease of the left anterior descending (50-60%) and diagonal (50-60%). 3. Moderate obstruction (60-70%) of the AV circumflex after the large marginal takeoff. 4. Scattered irregularities of the right coronary artery (30-40%)   Past Medical History:  Diagnosis Date  . Arthritis   . Breast cancer ([place])   . CAD (coronary artery disease)   . Chest pain   . CHF (congestive heart failure) ([place])   . Chronic kidney disease    stage 4  . Dizziness   . DM (diabetes mellitus) ([place])   . GERD (gastroesophageal reflux disease)   . HLD (hyperlipidemia)   . HTN (hypertension)   . Obesity   . PAF (paroxysmal  atrial fibrillation) ([place])   . Radiation 09/11/15-10/13/15   42.72 Gy to right breast, 12 Gy boost to right breast  . Shortness of breath dyspnea   . Sleep apnea    no longer uses cpap   . Stroke [facility])    balance issues - walks with  walker and uses wheelchair   . UTI (urinary tract infection)     Past Surgical History:  Procedure Laterality Date  . AV FISTULA PLACEMENT Left 07/14/2017   Procedure: Left arm Brachicephalic Fistula Creation;  Surgeon: Serafina Mitchell, MD;  Location: [place];  Service: Vascular;  Laterality: Left;  . BALLOON DILATION N/A 08/18/2017   Procedure: BALLOON DILATION;  Surgeon: Clarene Essex, MD;  Location: [place];  Service: Endoscopy;  Laterality: N/A;  . CHOLECYSTECTOMY    . colon polyps; hx    . CORONARY ANGIOPLASTY  02/14/09  . ESOPHAGOGASTRODUODENOSCOPY (EGD) WITH PROPOFOL Left 09/08/2015   Procedure: ESOPHAGOGASTRODUODENOSCOPY (EGD) WITH PROPOFOL;  Surgeon: Clarene Essex, MD;  Location: [facility] ENDOSCOPY;  Service: Endoscopy;  Laterality: Left;  . ESOPHAGOGASTRODUODENOSCOPY (EGD) WITH PROPOFOL N/A 08/18/2017   Procedure: ESOPHAGOGASTRODUODENOSCOPY (EGD) WITH PROPOFOL;  Surgeon: Clarene Essex, MD;  Location: [place];  Service: Endoscopy;  Laterality: N/A;  with flouroscopy  . IR FLUORO GUIDE CV LINE RIGHT  08/25/2017  . IR FLUORO GUIDE CV LINE RIGHT  08/29/2017  . IR US GUIDE VASC ACCESS RIGHT  08/25/2017  . RADIOACTIVE SEED GUIDED PARTIAL MASTECTOMY WITH AXILLARY SENTINEL LYMPH NODE BIOPSY Right 07/17/2015   Procedure: RADIOACTIVE SEED GUIDED PARTIAL MASTECTOMY WITH AXILLARY SENTINEL LYMPH NODE BIOPSY;  Surgeon: Erroll Luna, MD;  Location: [place];  Service: General;  Laterality: Right;    MEDICATIONS: No current facility-administered medications for this encounter.    Marland Kitchen acetaminophen (TYLENOL) 500 MG tablet  . aspirin EC 81 MG tablet  . cetirizine (ZYRTEC) 10 MG tablet  . citalopram (CELEXA) 20 MG tablet  . cloNIDine (CATAPRES) 0.1 MG tablet  . famotidine (PEPCID AC) 10 MG chewable tablet  . [place] ([place]) LIQD  . HUMALOG MIX 50/50 KWIKPEN (50-50) 100 UNIT/ML Kwikpen  . isosorbide mononitrate (IMDUR) 30 MG 24 hr tablet  . metoprolol succinate (TOPROL-XL) 25 MG 24 hr tablet  .  pantoprazole (PROTONIX) 40 MG tablet  . pravastatin (PRAVACHOL) 80 MG tablet  . simethicone (GAS-X) 80 MG chewable tablet  . warfarin (COUMADIN) 5 MG tablet   - Pt to hold coumadin 5 days before surgery   If labs acceptable day of surgery, I anticipate pt can proceed with surgery as scheduled.  Willeen Cass, FNP-BC [facility]/Anesthesiology Phone: [phone] 10/11/2017 2:55 PM

## 2017-10-11 NOTE — Progress Notes (Signed)
SDW-pre-op call completed by pt daughter [name] . Daughter denies that pt C/O any acute cardiopulmonary issues. Pt under the care of Dr. Meda Coffee, Cardiology. Daughter made aware to have pt stop taking otc vitamins, fish oil and herbal medications. Do not take any NSAIDs ie: Ibuprofen, Advil, Naproxen(Aleve), Motrin, BC and Goody powder. Daughter made aware to have pt take 12 units of Humalog 50/50 insulin tonight. Have pt to check BG every 2 hours prior to arrival to hospital on DOS. Pt made aware to treat a BG < 70 with 4 ounces of apple or cranberry juice, wait 15 minutes after intervention to recheck BG, if BG remains < 70, call Short Stay unit to speak with a nurse. Pt daughter, [name] stated that pt last dose of Coumadin was 10/06/17. Daughter verbalized understanding of all pre-op instructions

## 2017-10-12 ENCOUNTER — Ambulatory Visit (HOSPITAL_COMMUNITY)
Admission: RE | Admit: 2017-10-12 | Discharge: 2017-10-12 | Disposition: A | Discharge status: Home / Self Care | Payer: Medicare (Managed Care) | Source: Ambulatory Visit | Attending: Surgery | Admitting: Surgery

## 2017-10-12 ENCOUNTER — Encounter (HOSPITAL_COMMUNITY): Payer: Self-pay | Admitting: Certified Registered Nurse Anesthetist

## 2017-10-12 ENCOUNTER — Telehealth: Payer: Self-pay | Admitting: Surgery

## 2017-10-12 ENCOUNTER — Encounter (HOSPITAL_COMMUNITY)
Admission: RE | Disposition: A | Discharge status: Home / Self Care | Payer: Self-pay | Source: Ambulatory Visit | Attending: Surgery

## 2017-10-12 ENCOUNTER — Ambulatory Visit (HOSPITAL_COMMUNITY): Payer: Medicare (Managed Care) | Admitting: Emergency Medicine

## 2017-10-12 DIAGNOSIS — Z992 Dependence on renal dialysis: Secondary | ICD-10-CM | POA: Diagnosis not present

## 2017-10-12 DIAGNOSIS — I2510 Atherosclerotic heart disease of native coronary artery without angina pectoris: Secondary | ICD-10-CM | POA: Diagnosis not present

## 2017-10-12 DIAGNOSIS — Z8673 Personal history of transient ischemic attack (TIA), and cerebral infarction without residual deficits: Secondary | ICD-10-CM | POA: Insufficient documentation

## 2017-10-12 DIAGNOSIS — Z794 Long term (current) use of insulin: Secondary | ICD-10-CM | POA: Diagnosis not present

## 2017-10-12 DIAGNOSIS — G4730 Sleep apnea, unspecified: Secondary | ICD-10-CM | POA: Insufficient documentation

## 2017-10-12 DIAGNOSIS — I132 Hypertensive heart and chronic kidney disease with heart failure and with stage 5 chronic kidney disease, or end stage renal disease: Secondary | ICD-10-CM | POA: Insufficient documentation

## 2017-10-12 DIAGNOSIS — K219 Gastro-esophageal reflux disease without esophagitis: Secondary | ICD-10-CM | POA: Diagnosis not present

## 2017-10-12 DIAGNOSIS — I739 Peripheral vascular disease, unspecified: Secondary | ICD-10-CM | POA: Diagnosis not present

## 2017-10-12 DIAGNOSIS — N186 End stage renal disease: Secondary | ICD-10-CM | POA: Diagnosis not present

## 2017-10-12 DIAGNOSIS — E1122 Type 2 diabetes mellitus with diabetic chronic kidney disease: Secondary | ICD-10-CM | POA: Insufficient documentation

## 2017-10-12 DIAGNOSIS — D631 Anemia in chronic kidney disease: Secondary | ICD-10-CM | POA: Insufficient documentation

## 2017-10-12 DIAGNOSIS — I509 Heart failure, unspecified: Secondary | ICD-10-CM | POA: Diagnosis not present

## 2017-10-12 DIAGNOSIS — M1990 Unspecified osteoarthritis, unspecified site: Secondary | ICD-10-CM | POA: Diagnosis not present

## 2017-10-12 HISTORY — DX: Paroxysmal atrial fibrillation: I48.0

## 2017-10-12 HISTORY — PX: BASCILIC VEIN TRANSPOSITION: SHX5742

## 2017-10-12 LAB — GLUCOSE, CAPILLARY
GLUCOSE-CAPILLARY: 229 mg/dL — AB (ref 65–99)
Glucose-Capillary: 224 mg/dL — ABNORMAL HIGH (ref 65–99)

## 2017-10-12 LAB — POCT I-STAT 4, (NA,K, GLUC, HGB,HCT)
GLUCOSE: 267 mg/dL — AB (ref 65–99)
HEMATOCRIT: 39 % (ref 36.0–46.0)
HEMOGLOBIN: 13.3 g/dL (ref 12.0–15.0)
POTASSIUM: 2.9 mmol/L — AB (ref 3.5–5.1)
Sodium: 135 mmol/L (ref 135–145)

## 2017-10-12 LAB — HEMOGLOBIN A1C
Hgb A1c MFr Bld: 8 % — ABNORMAL HIGH (ref 4.8–5.6)
Mean Plasma Glucose: 182.9 mg/dL

## 2017-10-12 LAB — PROTIME-INR
INR: 1
Prothrombin Time: 13.1 seconds (ref 11.4–15.2)

## 2017-10-12 SURGERY — TRANSPOSITION, VEIN, BASILIC
Anesthesia: Monitor Anesthesia Care | Site: Arm Upper | Laterality: Left

## 2017-10-12 MED ORDER — PHENYLEPHRINE HCL 10 MG/ML IJ SOLN
INTRAMUSCULAR | Status: DC | PRN
  Administered 2017-10-12: 80 ug via INTRAVENOUS

## 2017-10-12 MED ORDER — LIDOCAINE HCL (CARDIAC) PF 100 MG/5ML IV SOSY
PREFILLED_SYRINGE | INTRAVENOUS | Status: DC | PRN
  Administered 2017-10-12: 80 mg via INTRAVENOUS

## 2017-10-12 MED ORDER — PROPOFOL 10 MG/ML IV BOLUS
INTRAVENOUS | Status: AC
  Filled 2017-10-12: qty 20

## 2017-10-12 MED ORDER — CEFAZOLIN SODIUM-DEXTROSE 2-4 GM/100ML-% IV SOLN
INTRAVENOUS | Status: AC
  Filled 2017-10-12: qty 100

## 2017-10-12 MED ORDER — 0.9 % SODIUM CHLORIDE (POUR BTL) OPTIME
TOPICAL | Status: DC | PRN
  Administered 2017-10-12: 1000 mL

## 2017-10-12 MED ORDER — SODIUM CHLORIDE 0.9 % IV SOLN
INTRAVENOUS | Status: DC | PRN
  Administered 2017-10-12: 08:00:00 via INTRAVENOUS

## 2017-10-12 MED ORDER — HEPARIN SODIUM (PORCINE) 1000 UNIT/ML IJ SOLN
INTRAMUSCULAR | Status: AC
  Filled 2017-10-12: qty 2

## 2017-10-12 MED ORDER — LIDOCAINE-EPINEPHRINE (PF) 1 %-1:200000 IJ SOLN
INTRAMUSCULAR | Status: AC
  Filled 2017-10-12: qty 30

## 2017-10-12 MED ORDER — PROMETHAZINE HCL 25 MG/ML IJ SOLN: 6.2500 mg | INTRAMUSCULAR | Status: DC | PRN

## 2017-10-12 MED ORDER — FENTANYL CITRATE (PF) 250 MCG/5ML IJ SOLN
INTRAMUSCULAR | Status: AC
  Filled 2017-10-12: qty 5

## 2017-10-12 MED ORDER — OXYCODONE HCL 5 MG/5ML PO SOLN: 5.0000 mg | Freq: Once | ORAL | Status: DC | PRN

## 2017-10-12 MED ORDER — SUCCINYLCHOLINE CHLORIDE 200 MG/10ML IV SOSY
PREFILLED_SYRINGE | INTRAVENOUS | Status: AC
  Filled 2017-10-12: qty 10

## 2017-10-12 MED ORDER — FENTANYL CITRATE (PF) 100 MCG/2ML IJ SOLN
INTRAMUSCULAR | Status: DC | PRN
  Administered 2017-10-12 (×3): 25 ug via INTRAVENOUS

## 2017-10-12 MED ORDER — CEFAZOLIN SODIUM-DEXTROSE 2-3 GM-%(50ML) IV SOLR
INTRAVENOUS | Status: DC | PRN
  Administered 2017-10-12: 2 g via INTRAVENOUS

## 2017-10-12 MED ORDER — ROCURONIUM BROMIDE 50 MG/5ML IV SOLN
INTRAVENOUS | Status: AC
  Filled 2017-10-12: qty 1

## 2017-10-12 MED ORDER — OXYCODONE-ACETAMINOPHEN 5-325 MG PO TABS: 1.0000 | ORAL_TABLET | Freq: Four times a day (QID) | ORAL | 0 refills | Status: DC | PRN

## 2017-10-12 MED ORDER — ONDANSETRON HCL 4 MG/2ML IJ SOLN
INTRAMUSCULAR | Status: AC
  Filled 2017-10-12: qty 2

## 2017-10-12 MED ORDER — ONDANSETRON HCL 4 MG/2ML IJ SOLN
INTRAMUSCULAR | Status: DC | PRN
  Administered 2017-10-12: 4 mg via INTRAVENOUS

## 2017-10-12 MED ORDER — MIDAZOLAM HCL 2 MG/2ML IJ SOLN
INTRAMUSCULAR | Status: AC
  Filled 2017-10-12: qty 2

## 2017-10-12 MED ORDER — SODIUM CHLORIDE 0.9 % IV SOLN
INTRAVENOUS | Status: AC
  Filled 2017-10-12: qty 1.2

## 2017-10-12 MED ORDER — PROPOFOL 10 MG/ML IV BOLUS
INTRAVENOUS | Status: DC | PRN
  Administered 2017-10-12: 150 mg via INTRAVENOUS

## 2017-10-12 MED ORDER — HYDROMORPHONE HCL 1 MG/ML IJ SOLN: 0.2500 mg | INTRAMUSCULAR | Status: DC | PRN

## 2017-10-12 MED ORDER — SODIUM CHLORIDE 0.9 % IV SOLN
INTRAVENOUS | Status: DC | PRN
  Administered 2017-10-12: 500 mL

## 2017-10-12 MED ORDER — OXYCODONE HCL 5 MG PO TABS: 5.0000 mg | ORAL_TABLET | Freq: Once | ORAL | Status: DC | PRN

## 2017-10-12 SURGICAL SUPPLY — 39 items
ADH SKN CLS APL DERMABOND .7 (GAUZE/BANDAGES/DRESSINGS) ×1
ADH SKN CLS LQ APL DERMABOND (GAUZE/BANDAGES/DRESSINGS) ×1
ARMBAND PINK RESTRICT EXTREMIT (MISCELLANEOUS) ×3 IMPLANT
CANISTER SUCT 3000ML PPV (MISCELLANEOUS) ×3 IMPLANT
CLIP VESOCCLUDE MED 24/CT (CLIP) ×2 IMPLANT
CLIP VESOCCLUDE MED 6/CT (CLIP) IMPLANT
CLIP VESOCCLUDE SM WIDE 24/CT (CLIP) ×2 IMPLANT
CLIP VESOCCLUDE SM WIDE 6/CT (CLIP) IMPLANT
COVER PROBE W GEL 5X96 (DRAPES) ×3 IMPLANT
DERMABOND ADHESIVE PROPEN (GAUZE/BANDAGES/DRESSINGS) ×2
DERMABOND ADVANCED (GAUZE/BANDAGES/DRESSINGS) ×2
DERMABOND ADVANCED .7 DNX12 (GAUZE/BANDAGES/DRESSINGS) ×1 IMPLANT
DERMABOND ADVANCED .7 DNX6 (GAUZE/BANDAGES/DRESSINGS) IMPLANT
ELECT REM PT RETURN 9FT ADLT (ELECTROSURGICAL) ×3
ELECTRODE REM PT RTRN 9FT ADLT (ELECTROSURGICAL) ×1 IMPLANT
GLOVE BIOGEL PI IND STRL 7.5 (GLOVE) ×1 IMPLANT
GLOVE BIOGEL PI INDICATOR 7.5 (GLOVE) ×2
GLOVE SURG SS PI 7.5 STRL IVOR (GLOVE) ×3 IMPLANT
GOWN STRL REUS W/ TWL LRG LVL3 (GOWN DISPOSABLE) ×2 IMPLANT
GOWN STRL REUS W/ TWL XL LVL3 (GOWN DISPOSABLE) ×1 IMPLANT
GOWN STRL REUS W/TWL LRG LVL3 (GOWN DISPOSABLE) ×6
GOWN STRL REUS W/TWL XL LVL3 (GOWN DISPOSABLE) ×3
HEMOSTAT SNOW SURGICEL 2X4 (HEMOSTASIS) IMPLANT
KIT BASIN OR (CUSTOM PROCEDURE TRAY) ×3 IMPLANT
KIT TURNOVER KIT B (KITS) ×3 IMPLANT
NS IRRIG 1000ML POUR BTL (IV SOLUTION) ×3 IMPLANT
PACK CV ACCESS (CUSTOM PROCEDURE TRAY) ×3 IMPLANT
PAD ARMBOARD 7.5X6 YLW CONV (MISCELLANEOUS) ×6 IMPLANT
SUT PROLENE 6 0 BV (SUTURE) ×2 IMPLANT
SUT PROLENE 6 0 CC (SUTURE) ×5 IMPLANT
SUT SILK 2 0 SH (SUTURE) ×2 IMPLANT
SUT VIC AB 2-0 CT1 27 (SUTURE) ×3
SUT VIC AB 2-0 CT1 TAPERPNT 27 (SUTURE) IMPLANT
SUT VIC AB 3-0 SH 27 (SUTURE) ×6
SUT VIC AB 3-0 SH 27X BRD (SUTURE) ×1 IMPLANT
SUT VICRYL 4-0 PS2 18IN ABS (SUTURE) ×5 IMPLANT
TOWEL GREEN STERILE (TOWEL DISPOSABLE) ×3 IMPLANT
UNDERPAD 30X30 (UNDERPADS AND DIAPERS) ×3 IMPLANT
WATER STERILE IRR 1000ML POUR (IV SOLUTION) ×1 IMPLANT

## 2017-10-12 NOTE — Anesthesia Procedure Notes (Signed)
Procedure Name: LMA Insertion Date/Time: 10/12/2017 8:43 AM Performed by: Carney Living, CRNA Pre-anesthesia Checklist: Patient identified, Emergency Drugs available, Suction available, Patient being monitored and Timeout performed Patient Re-evaluated:Patient Re-evaluated prior to induction Oxygen Delivery Method: Circle system utilized Preoxygenation: Pre-oxygenation with 100% oxygen Induction Type: IV induction LMA: LMA inserted LMA Size: 4.0 Number of attempts: 1 Placement Confirmation: positive ETCO2 and breath sounds checked- equal and bilateral Tube secured with: Tape Dental Injury: Teeth and Oropharynx as per pre-operative assessment

## 2017-10-12 NOTE — Anesthesia Preprocedure Evaluation (Addendum)
Anesthesia Evaluation  Patient identified by MRN, date of birth, ID band Patient awake    Reviewed: Allergy & Precautions, H&P , NPO status , Patient's Chart, lab work & pertinent test results, reviewed documented beta blocker date and time   Airway Mallampati: III  TM Distance: >3 FB Neck ROM: Full    Dental no notable dental hx. (+) Edentulous Upper, Dental Advisory Given, Partial Lower   Pulmonary neg pulmonary ROS, sleep apnea ,    Pulmonary exam normal breath sounds clear to auscultation       Cardiovascular hypertension, Pt. on medications and Pt. on home beta blockers + CAD, + Peripheral Vascular Disease and +CHF   Rhythm:Regular Rate:Normal     Neuro/Psych CVA, Residual Symptoms negative psych ROS   GI/Hepatic Neg liver ROS, GERD  Medicated and Controlled,  Endo/Other  diabetes, Insulin Dependent  Renal/GU CRFRenal disease  negative genitourinary   Musculoskeletal  (+) Arthritis , Osteoarthritis,    Abdominal   Peds  Hematology negative hematology ROS (+) anemia ,   Anesthesia Other Findings   Reproductive/Obstetrics negative OB ROS                            Anesthesia Physical  Anesthesia Plan  ASA: IV  Anesthesia Plan: MAC   Post-op Pain Management:    Induction: Intravenous  PONV Risk Score and Plan: 2 and Ondansetron, Propofol infusion and Treatment may vary due to age or medical condition  Airway Management Planned: Simple Face Mask  Additional Equipment:   Intra-op Plan:   Post-operative Plan:   Informed Consent: I have reviewed the patients History and Physical, chart, labs and discussed the procedure including the risks, benefits and alternatives for the proposed anesthesia with the patient or authorized representative who has indicated his/her understanding and acceptance.   Dental advisory given  Plan Discussed with: CRNA, Anesthesiologist and  Surgeon  Anesthesia Plan Comments:        Anesthesia Quick Evaluation

## 2017-10-12 NOTE — Anesthesia Postprocedure Evaluation (Signed)
Anesthesia Post Note  Patient: Glenda [name]  Procedure(s) Performed: SECOND STAGE BASILIC VEIN TRANSPOSITION LEFT ARM (Left Arm Upper)     Patient location during evaluation: PACU Anesthesia Type: General Level of consciousness: awake and alert Pain management: pain level controlled Vital Signs Assessment: post-procedure vital signs reviewed and stable Respiratory status: spontaneous breathing, nonlabored ventilation and respiratory function stable Cardiovascular status: blood pressure returned to baseline and stable Postop Assessment: no apparent nausea or vomiting Anesthetic complications: no    Last Vitals:  Vitals:   10/12/17 1105 10/12/17 1119  BP:  (!) 114/40  Pulse: 64 73  Resp: 16 16  Temp:    SpO2: 100% 100%    Last Pain:  Vitals:   10/12/17 1040  PainSc: 0-No pain                 Lynda Rainwater

## 2017-10-12 NOTE — Telephone Encounter (Signed)
sch appt lvm 11/07/17 1pm p/o PA

## 2017-10-12 NOTE — Interval H&P Note (Signed)
History and Physical Interval Note:  10/12/2017 8:22 AM  Glenda [name]  has presented today for surgery, with the diagnosis of END STAGE RENAL DISEASE FOR HEMODIALYSIS ACCESS  The various methods of treatment have been discussed with the patient and family. After consideration of risks, benefits and other options for treatment, the patient has consented to  Procedure(s): SECOND STAGE BASILIC VEIN TRANSPOSITION LEFT ARM (Left) as a surgical intervention .  The patient's history has been reviewed, patient examined, no change in status, stable for surgery.  I have reviewed the patient's chart and labs.  Questions were answered to the patient's satisfaction.     Annamarie Major

## 2017-10-12 NOTE — Discharge Instructions (Signed)
° °  [facility] ° °Discharge Instructions ° °AV Fistula or Graft Surgery for Dialysis Access ° °Please refer to the following instructions for your post-procedure care. Your surgeon or physician assistant will discuss any changes with you. ° °Activity ° °You may drive the day following your surgery, if you are comfortable and no longer taking prescription pain medication. Resume full activity as the soreness in your incision resolves. ° °Bathing/Showering ° °You may shower after you go home. Keep your incision dry for 48 hours. Do not soak in a bathtub, hot tub, or swim until the incision heals completely. You may not shower if you have a hemodialysis catheter. ° °Incision Care ° °Clean your incision with mild soap and water after 48 hours. Pat the area dry with a clean towel. You do not need a bandage unless otherwise instructed. Do not apply any ointments or creams to your incision. You may have skin glue on your incision. Do not peel it off. It will come off on its own in about one week. Your arm may swell a bit after surgery. To reduce swelling use pillows to elevate your arm so it is above your heart. Your doctor will tell you if you need to lightly wrap your arm with an ACE bandage. ° °Diet ° °Resume your normal diet. There are not special food restrictions following this procedure. In order to heal from your surgery, it is CRITICAL to get adequate nutrition. Your body requires vitamins, minerals, and protein. Vegetables are the best source of vitamins and minerals. Vegetables also provide the perfect balance of protein. Processed food has little nutritional value, so try to avoid this. ° °Medications ° °Resume taking all of your medications. If your incision is causing pain, you may take over-the counter pain relievers such as acetaminophen (Tylenol). If you were prescribed a stronger pain medication, please be aware these medications can cause nausea and constipation. Prevent  nausea by taking the medication with a snack or meal. Avoid constipation by drinking plenty of fluids and eating foods with high amount of fiber, such as fruits, vegetables, and grains. Do not take Tylenol if you are taking prescription pain medications. ° ° ° ° °Follow up °Your surgeon may want to see you in the office following your access surgery. If so, this will be arranged at the time of your surgery. ° °Please call us immediately for any of the following conditions: ° °Increased pain, redness, drainage (pus) from your incision site °Fever of 101 degrees or higher °Severe or worsening pain at your incision site °Hand pain or numbness. ° °Reduce your risk of vascular disease: ° °Stop smoking. If you would like help, call [facility] at 1-800-QUIT-NOW (1-800-784-8669) or [place] at 336-586-4000 ° °Manage your cholesterol °Maintain a desired weight °Control your diabetes °Keep your blood pressure down ° °Dialysis ° °It will take several weeks to several months for your new dialysis access to be ready for use. Your surgeon will determine when it is OK to use it. Your nephrologist will continue to direct your dialysis. You can continue to use your Permcath until your new access is ready for use. ° °If you have any questions, please call the office at [phone]. ° °

## 2017-10-12 NOTE — Op Note (Signed)
    Patient name: Glenda [name] MRN: [id number] DOB: 17-Jul-1933 Sex: female  10/12/2017 Pre-operative Diagnosis: ESRD Post-operative diagnosis:  Same Surgeon:  Annamarie Major Assistants:  Laurence Slate Procedure:   Second stage left basilic vein fistula creation Anesthesia: General Blood Loss: Minimal Specimens: None Findings: Excellent vein, greater than 8 mm  Indications: The patient has previously undergone a first stage basilic vein fistula creation.  She is here today for elevation.  Preoperative imaging revealed an excellent vein.  Procedure:  The patient was identified in the holding area and taken to [place] 16  The patient was then placed supine on the table. general anesthesia was administered.  The patient was prepped and draped in the usual sterile fashion.  A time out was called and antibiotics were administered.  Ultrasound was used to map the course of the basilic vein in the upper arm.  2 longitudinal  skin incisions were made in the upper arm.  I circumferentially isolated the basilic vein.  This was a at least 8 mm vein throughout.  The nerve was protected.  Side branches were ligated between silk ties.  Once a had the fistula was fully isolated, I marked it for orientation.  It was then occluded with vascular clamps.  I transected the vein near the antecubital crease.  I created a subcutaneous tunnel and brought the vein through the tunnel.  I then performed a end to end anastomosis with running 5-0 Prolene.  Once this was completed the clamps were released.  There was an excellent thrill within the fistula.  The wound was irrigated.  Hemostasis was achieved.  The incisions were closed with 2 layers of 3-0 Vicryl followed by Dermabond.  There were no immediate complications.   Disposition: To PACU stable   V. Annamarie Major, M.D. [facility] Office: [phone] Pager:  [phone]

## 2017-10-12 NOTE — Progress Notes (Addendum)
Pt.,s daughter stated that this morning pt. Passed bright red blood per rectum this morning. Daughter states that this has not happened before.   Potassium 2.9  Per I-stat.  Dr. Sabra Heck notified

## 2017-10-12 NOTE — Transfer of Care (Signed)
Immediate Anesthesia Transfer of Care Note  Patient: Glenda [name]  Procedure(s) Performed: SECOND STAGE BASILIC VEIN TRANSPOSITION LEFT ARM (Left Arm Upper)  Patient Location: PACU  Anesthesia Type:General  Level of Consciousness: awake, alert , oriented and patient cooperative  Airway & Oxygen Therapy: Patient Spontanous Breathing and Patient connected to nasal cannula oxygen  Post-op Assessment: Report given to RN, Post -op Vital signs reviewed and stable and Patient moving all extremities X 4  Post vital signs: Reviewed and stable  Last Vitals:  Vitals Value Taken Time  BP 149/46 10/12/2017 10:40 AM  Temp    Pulse 66 10/12/2017 10:44 AM  Resp 14 10/12/2017 10:44 AM  SpO2 98 % 10/12/2017 10:44 AM  Vitals shown include unvalidated device data.  Last Pain: There were no vitals filed for this visit.       Complications: No apparent anesthesia complications

## 2017-10-13 ENCOUNTER — Encounter (HOSPITAL_COMMUNITY): Payer: Self-pay | Admitting: Surgery

## 2017-11-07 ENCOUNTER — Other Ambulatory Visit: Payer: Self-pay

## 2017-11-07 ENCOUNTER — Ambulatory Visit (INDEPENDENT_AMBULATORY_CARE_PROVIDER_SITE_OTHER): Payer: Self-pay | Admitting: Physician Assistant

## 2017-11-07 VITALS — BP 140/56 | HR 65 | Temp 98.1°F | Resp 16 | Ht 66.0 in | Wt 140.0 lb

## 2017-11-07 DIAGNOSIS — N186 End stage renal disease: Secondary | ICD-10-CM

## 2017-11-07 DIAGNOSIS — Z992 Dependence on renal dialysis: Secondary | ICD-10-CM

## 2017-11-07 NOTE — Progress Notes (Signed)
POST OPERATIVE OFFICE NOTE    CC:  F/u for surgery  HPI:  This is a 84 y.o. female who is s/p 2nd stage left basilic vein transposition on 10/12/17 by Dr. Trula Slade.  She originally underwent the 1st stage left BVT on 07/14/17 also by Dr. Trula Slade.  She presents today for follow up to surgery.  She states that she does have some numbness in her fingers especially the first finger but doesn't have any trouble using her hand and it does not bother her.  She states it has been this way since placement of the fistula. She notices it more when she is on dialysis.    She currently dialyzes at the [facility] location on T/T/S.  Her tunneled dialysis catheter was placed by interventional radiology on 08/29/17.    She states that her twin sister has been on dialysis for ~ 6 years.  She is coming to town this weekend for a family reunion.    No Known Allergies  Current Outpatient Medications  Medication Sig Dispense Refill  . acetaminophen (TYLENOL) 500 MG tablet Take 1,000 mg by mouth daily as needed for moderate pain or headache.    Marland Kitchen aspirin EC 81 MG tablet Take 81 mg by mouth every evening.     . cetirizine (ZYRTEC) 10 MG tablet Take 10 mg by mouth every evening.     . citalopram (CELEXA) 20 MG tablet Take 20 mg by mouth every evening.     . cloNIDine (CATAPRES) 0.1 MG tablet Take 0.1 mg by mouth 2 (two) times daily.    . famotidine (PEPCID AC) 10 MG chewable tablet Chew 10 mg by mouth daily as needed for heartburn.    Marland Kitchen GLUCERNA (GLUCERNA) LIQD Take 237 mLs by mouth daily as needed (for poor intake).    Marland Kitchen HUMALOG MIX 50/50 KWIKPEN (50-50) 100 UNIT/ML Kwikpen Inject 18-20 Units into the skin 2 (two) times daily. Inject 20 units in the morning and 18 units at bedtime; hold for blood sugar readings lower than 200  11  . isosorbide mononitrate (IMDUR) 30 MG 24 hr tablet Take 1 tablet (30 mg total) daily by mouth. (Patient taking differently: Take 30 mg by mouth every evening. ) 30 tablet 0  .  metoprolol succinate (TOPROL-XL) 25 MG 24 hr tablet Take 1 tablet (25 mg total) by mouth daily. (Patient taking differently: Take 25 mg by mouth every evening. ) 30 tablet 0  . oxyCODONE-acetaminophen (PERCOCET/ROXICET) 5-325 MG tablet Take 1 tablet by mouth every 6 (six) hours as needed. 12 tablet 0  . pantoprazole (PROTONIX) 40 MG tablet Take 40 mg by mouth every evening.     . pravastatin (PRAVACHOL) 80 MG tablet Take 80 mg by mouth at bedtime.     . simethicone (GAS-X) 80 MG chewable tablet Chew 80 mg by mouth every 6 (six) hours as needed for flatulence.    . warfarin (COUMADIN) 5 MG tablet Take 1.5 tablets (7.5 mg total) by mouth one time only at 6 PM. (Patient taking differently: Take 2.5 mg by mouth every evening. ) 30 tablet 0   No current facility-administered medications for this visit.      ROS:  See HPI  Physical Exam:  Vitals:   11/07/17 1301  BP: (!) 140/56  Pulse: 65  Resp: 16  Temp: 98.1 F (36.7 C)  SpO2: 100%   Vitals:   11/07/17 1301  Weight: 140 lb (63.5 kg)  Height: 5\' 6"  (1.676 m)   Body mass  index is 22.6 kg/m.   Incision:  incisions on the left upper arm have healed nicely. Extremities:  Faintly palpable left radial pulse.  Strong hand grip; there is an excellent thrill, bruit throughout the fistula and it is easily palpable   Assessment/Plan:  This is a 84 y.o. female who is s/p: 2nd stage left basilic vein transposition on 10/12/17 by Dr. Trula Slade  -she does have some numbness in her left 1st finger that has been present since placement of her fistula & notices it more on dialysis.  She states that it doesn't bother her and she has full function of her hand.  She has a faintly palpable left radial pulse. I discussed with her and her daughter that if this worsens, she will need to contact us and be seen.  She and her daughter express understanding.   -the fistula has an excellent thrill/bruit throughout and is easily palpable.  May start using the  fistula on 11/08/17.  Once it has been used successfully 2-3 times without difficulty, can schedule with interventional radiology to have tunneled catheter removed.  She is on an aspirin daily.  She is not taking coumadin. -she will f/u with [place] as needed.    Leontine Locket, PA-C [facility] [phone]  Clinic MD:  Trula Slade

## 2018-01-06 ENCOUNTER — Ambulatory Visit
Admission: RE | Admit: 2018-01-06 | Discharge: 2018-01-06 | Disposition: A | Discharge status: Home / Self Care | Payer: Medicare (Managed Care) | Source: Ambulatory Visit | Attending: Internal Medicine | Admitting: Internal Medicine

## 2018-01-06 ENCOUNTER — Other Ambulatory Visit: Payer: Self-pay | Admitting: Internal Medicine

## 2018-01-06 DIAGNOSIS — T1490XA Injury, unspecified, initial encounter: Secondary | ICD-10-CM

## 2018-01-09 ENCOUNTER — Other Ambulatory Visit: Payer: Self-pay | Admitting: Internal Medicine

## 2018-01-09 DIAGNOSIS — Z853 Personal history of malignant neoplasm of breast: Secondary | ICD-10-CM

## 2018-01-16 ENCOUNTER — Inpatient Hospital Stay: Payer: Medicare (Managed Care) | Attending: Hematology and Oncology | Admitting: Hematology and Oncology

## 2018-01-16 ENCOUNTER — Telehealth: Payer: Self-pay | Admitting: Hematology and Oncology

## 2018-01-16 DIAGNOSIS — Z853 Personal history of malignant neoplasm of breast: Secondary | ICD-10-CM | POA: Insufficient documentation

## 2018-01-16 DIAGNOSIS — I4891 Unspecified atrial fibrillation: Secondary | ICD-10-CM | POA: Diagnosis not present

## 2018-01-16 DIAGNOSIS — Z171 Estrogen receptor negative status [ER-]: Secondary | ICD-10-CM

## 2018-01-16 DIAGNOSIS — Z923 Personal history of irradiation: Secondary | ICD-10-CM | POA: Insufficient documentation

## 2018-01-16 DIAGNOSIS — I890 Lymphedema, not elsewhere classified: Secondary | ICD-10-CM | POA: Insufficient documentation

## 2018-01-16 DIAGNOSIS — N186 End stage renal disease: Secondary | ICD-10-CM | POA: Diagnosis not present

## 2018-01-16 DIAGNOSIS — C50411 Malignant neoplasm of upper-outer quadrant of right female breast: Secondary | ICD-10-CM

## 2018-01-16 DIAGNOSIS — I509 Heart failure, unspecified: Secondary | ICD-10-CM | POA: Diagnosis not present

## 2018-01-16 NOTE — Progress Notes (Signed)
Patient Care Team: Angelica Pou, MD as PCP - General (Internal Medicine) Dorothy Spark, MD as PCP - Cardiology (Cardiology) Erroll Luna, MD as Consulting Physician (General Surgery) Nicholas Lose, MD as Consulting Physician (Hematology and Oncology) Gery Pray, MD as Consulting Physician (Radiation Oncology)  DIAGNOSIS:  Encounter Diagnosis  Name Primary?  . Malignant neoplasm of upper-outer quadrant of right breast in female, estrogen receptor negative ([place])     SUMMARY OF ONCOLOGIC HISTORY:   Breast cancer of upper-outer quadrant of right female breast ([place])   04/14/2015 Mammogram    Right breast asymmetry at 9:30 position 1.3 x 0.8 x 1.4 cm    04/24/2015 Initial Diagnosis    Right breast biopsy 9:30 position: DCIS high-grade with necrosis and calcifications,? Early invasive disease, ER 0% PR 0%    07/17/2015 Surgery    Right lumpectomy: IDC grade 2, 0.3 cm, with HG-DCIS with comedonecrosis, DCIS focally less than 0.1 cm from posterior margin and broadly 0.2 cm from posterior margin, 0/1 LN neg, T1 aN0 stage IA, ER 0%, PR 0%, Ki-67 5%, HER-2 neg ratio 0.93    09/11/2015 - 10/13/2015 Radiation Therapy    Right breast was treated with 42.72 Gy in 16 fractions with a boost of 12 Gy in 6 fractions.     CHIEF COMPLIANT: Surveillance of breast cancer  INTERVAL HISTORY: Glenda [name] is a 84 year old with above-mentioned history of right breast cancer treated with lumpectomy followed by radiation.  She is triple negative disease.  She is currently on her annual follow-up.  She denies any pain or discomfort in the breast or lumps or nodules.  She has been admitted to the hospital with congestive heart failure as well as end-stage kidney disease on dialysis.  She is wheelchair-bound but she is still able to take her own bath with the help of a sponge.  She is able to dress herself as well.  REVIEW OF SYSTEMS:   Constitutional: Denies fevers, chills or abnormal  weight loss Eyes: Denies blurriness of vision Ears, nose, mouth, throat, and face: Denies mucositis or sore throat Respiratory: Denies cough, dyspnea or wheezes Cardiovascular: Denies palpitation, chest discomfort Gastrointestinal:  Denies nausea, heartburn or change in bowel habits Skin: Denies abnormal skin rashes Lymphatics: Denies new lymphadenopathy or easy bruising Neurological:Denies numbness, tingling or new weaknesses Behavioral/Psych: Mood is stable, no new changes  Extremities: Generalized weakness Breast: Denies any lumps or nodules in the breast.   All other systems were reviewed with the patient and are negative.  I have reviewed the past medical history, past surgical history, social history and family history with the patient and they are unchanged from previous note.  ALLERGIES:  has No Known Allergies.  MEDICATIONS:  Current Outpatient Medications  Medication Sig Dispense Refill  . acetaminophen (TYLENOL) 500 MG tablet Take 1,000 mg by mouth daily as needed for moderate pain or headache.    Marland Kitchen aspirin EC 81 MG tablet Take 81 mg by mouth every evening.     . cetirizine (ZYRTEC) 10 MG tablet Take 10 mg by mouth every evening.     . citalopram (CELEXA) 20 MG tablet Take 20 mg by mouth every evening.     . cloNIDine (CATAPRES) 0.1 MG tablet Take 0.1 mg by mouth 2 (two) times daily.    . famotidine (PEPCID AC) 10 MG chewable tablet Chew 10 mg by mouth daily as needed for heartburn.    Marland Kitchen GLUCERNA (GLUCERNA) LIQD Take 237 mLs by mouth daily as needed (  for poor intake).    Marland Kitchen HUMALOG MIX 50/50 KWIKPEN (50-50) 100 UNIT/ML Kwikpen Inject 18-20 Units into the skin 2 (two) times daily. Inject 20 units in the morning and 18 units at bedtime; hold for blood sugar readings lower than 200  11  . isosorbide mononitrate (IMDUR) 30 MG 24 hr tablet Take 1 tablet (30 mg total) daily by mouth. (Patient taking differently: Take 30 mg by mouth every evening. ) 30 tablet 0  . metoprolol  succinate (TOPROL-XL) 25 MG 24 hr tablet Take 1 tablet (25 mg total) by mouth daily. (Patient taking differently: Take 25 mg by mouth every evening. ) 30 tablet 0  . oxyCODONE-acetaminophen (PERCOCET/ROXICET) 5-325 MG tablet Take 1 tablet by mouth every 6 (six) hours as needed. (Patient not taking: Reported on 11/07/2017) 12 tablet 0  . pantoprazole (PROTONIX) 40 MG tablet Take 40 mg by mouth every evening.     . pravastatin (PRAVACHOL) 80 MG tablet Take 80 mg by mouth at bedtime.     . simethicone (GAS-X) 80 MG chewable tablet Chew 80 mg by mouth every 6 (six) hours as needed for flatulence.    . warfarin (COUMADIN) 5 MG tablet Take 1.5 tablets (7.5 mg total) by mouth one time only at 6 PM. (Patient not taking: Reported on 11/07/2017) 30 tablet 0   No current facility-administered medications for this visit.     PHYSICAL EXAMINATION: ECOG PERFORMANCE STATUS: 1 - Symptomatic but completely ambulatory  Vitals:   01/16/18 1035  BP: (!) 167/47  Pulse: 64  Resp: 18  Temp: 98 F (36.7 C)  SpO2: 100%   Filed Weights   01/16/18 1035  Weight: 145 lb 11.2 oz (66.1 kg)    GENERAL:alert, no distress and comfortable SKIN: skin color, texture, turgor are normal, no rashes or significant lesions EYES: normal, Conjunctiva are pink and non-injected, sclera clear OROPHARYNX:no exudate, no erythema and lips, buccal mucosa, and tongue normal  NECK: supple, thyroid normal size, non-tender, without nodularity LYMPH:  no palpable lymphadenopathy in the cervical, axillary or inguinal LUNGS: clear to auscultation and percussion with normal breathing effort HEART: regular rate & rhythm and no murmurs and no lower extremity edema ABDOMEN:abdomen soft, non-tender and normal bowel sounds MUSCULOSKELETAL:no cyanosis of digits and no clubbing  NEURO: alert & oriented x 3 with fluent speech, no focal motor/sensory deficits EXTREMITIES: No lower extremity edema BREAST  No palpable masses or nodules in either  right or left breasts. No palpable axillary supraclavicular or infraclavicular adenopathy no breast tenderness or nipple discharge. (exam performed in the presence of a chaperone)  LABORATORY DATA:  I have reviewed the data as listed CMP Latest Ref Rng & Units 10/12/2017 09/01/2017 08/27/2017  Glucose 65 - 99 mg/dL 267(H) 164(H) 138(H)  BUN 6 - 20 mg/dL - 30(H) 35(H)  Creatinine 0.44 - 1.00 mg/dL - 2.82(H) 2.79(H)  Sodium 135 - 145 mmol/L 135 138 136  Potassium 3.5 - 5.1 mmol/L 2.9(L) 4.1 3.3(L)  Chloride 101 - 111 mmol/L - 100(L) 96(L)  CO2 22 - 32 mmol/L - 30 30  Calcium 8.9 - 10.3 mg/dL - 8.4(L) 8.5(L)  Total Protein 6.5 - 8.1 g/dL - - -  Total Bilirubin 0.3 - 1.2 mg/dL - - -  Alkaline Phos 38 - 126 U/L - - -  AST 15 - 41 U/L - - -  ALT 14 - 54 U/L - - -    Lab Results  Component Value Date   WBC 5.1 09/01/2017   HGB  13.3 10/12/2017   HCT 39.0 10/12/2017   MCV 88.6 09/01/2017   PLT 228 09/01/2017   NEUTROABS 3.4 08/24/2017    ASSESSMENT & PLAN:  Breast cancer of upper-outer quadrant of right female breast ([place]) Right lumpectomy 07/17/2015: IDC grade 2, 0.3 cm, with high-grade DCIS with comedonecrosis, DCIS focally less than 0.1 cm from posterior margin and broadly 0.2 cm from posterior margin, 0/1 lymph node negative, T1 aN0 stage IA, ER 0%, PR 0%, HER-2 negative ratio 0.93  Original right breast biopsy was DCIS ER/PR negative Adjuvant radiation therapy: 09/11/2015 to 10/13/2015 Hospitalization for CHF 01/06/2017 Hospitalization for ESRD, respiratory failure, CHF, A. Fib 08/20/2017 to 09/01/2017  Current treatment: Surveillance Breast Cancer Surveillance: 1. Breast exam  01/16/2018: Right breast lymphedema and scar tissue 2. Mammogram to be done on 04/14/2018  I discussed with her that her major issues are related to comorbidities. Return to clinic in one year for surveillance and follow-up    No orders of the defined types were placed in this encounter.  The patient has  a good understanding of the overall plan. she agrees with it. she will call with any problems that may develop before the next visit here.   Harriette Ohara, MD 01/16/18

## 2018-01-16 NOTE — Telephone Encounter (Signed)
Per 9/23 los.  Called patient with next year's appt.  She asked me to also speak to "[name]" and give her the information as well.  Mailed calendar.

## 2018-01-16 NOTE — Assessment & Plan Note (Signed)
Right lumpectomy 07/17/2015: IDC grade 2, 0.3 cm, with high-grade DCIS with comedonecrosis, DCIS focally less than 0.1 cm from posterior margin and broadly 0.2 cm from posterior margin, 0/1 lymph node negative, T1 aN0 stage IA, ER 0%, PR 0%, HER-2 negative ratio 0.93  Original right breast biopsy was DCIS ER/PR negative Adjuvant radiation therapy: 09/11/2015 to 10/13/2015 Hospitalization for CHF 01/06/2017 Hospitalization for ESRD, respiratory failure, CHF, A. Fib 08/20/2017 to 09/01/2017  Current treatment: Surveillance Breast Cancer Surveillance: 1. Breast exam  01/16/2018: Right breast lymphedema and scar tissue 2. Mammogram to be done on 04/14/2018  Return to clinic in one year for surveillance and follow-up

## 2018-04-12 ENCOUNTER — Other Ambulatory Visit: Payer: Self-pay

## 2018-04-12 DIAGNOSIS — I739 Peripheral vascular disease, unspecified: Secondary | ICD-10-CM

## 2018-04-14 ENCOUNTER — Encounter: Payer: Self-pay | Admitting: Podiatry

## 2018-04-14 ENCOUNTER — Other Ambulatory Visit: Payer: Self-pay | Admitting: Podiatry

## 2018-04-14 ENCOUNTER — Ambulatory Visit (INDEPENDENT_AMBULATORY_CARE_PROVIDER_SITE_OTHER): Payer: Medicare (Managed Care) | Admitting: Podiatry

## 2018-04-14 ENCOUNTER — Ambulatory Visit (INDEPENDENT_AMBULATORY_CARE_PROVIDER_SITE_OTHER): Payer: Medicare (Managed Care)

## 2018-04-14 DIAGNOSIS — E08621 Diabetes mellitus due to underlying condition with foot ulcer: Secondary | ICD-10-CM

## 2018-04-14 DIAGNOSIS — M79671 Pain in right foot: Secondary | ICD-10-CM

## 2018-04-14 DIAGNOSIS — L97501 Non-pressure chronic ulcer of other part of unspecified foot limited to breakdown of skin: Secondary | ICD-10-CM | POA: Diagnosis not present

## 2018-04-14 DIAGNOSIS — M79672 Pain in left foot: Secondary | ICD-10-CM

## 2018-04-15 NOTE — Progress Notes (Signed)
Subjective:   Patient ID: Glenda [name], female   DOB: 84 y.o.   MRN: [id number]   HPI Patient presents with discoloration of the distal aspects of the big toe bilateral and has caregiver and presents with an appointment for vascular studies to be done in January.  States that this is been going on for a short period of time and that it is not progressed but they just wanted to make sure everything was okay   Review of Systems  All other systems reviewed and are negative.       Objective:  Physical Exam Vitals signs and nursing note reviewed.  Constitutional:      Appearance: She is well-developed.  Pulmonary:     Effort: Pulmonary effort is normal.  Musculoskeletal: Normal range of motion.  Skin:    General: Skin is warm.  Neurological:     Mental Status: She is alert.     Vascular status diminished with nonpalpable PT DP pulses.  The feet are relatively warm and there was cap fill time but there is noted to be discoloration of the distal hallux left over right with crusted tissue formation and what appears to be blood blister formation.  The digits themselves do not appear to be infected and do not appear to be in a gangrenous type situation     Assessment:  Probability for trauma to the distal hallux left over right creating blood blisters with darkened tissue with also patient experiencing vascular disease     Plan:  H&P condition reviewed and strong encouragement given to keep appointment with vascular physician.  Today I gently debrided the distal end of the left hallux found her to be good fresh active bleeding no drainage no pus formation no abscess tissue.  I do think this is a very good sign and I did advised on soaking this and I applied a sterile dressing today to the left hallux I also applied padding to each big toe to provide for cushion.  I gave strict instructions if any blackness redness or indications of infection or gangrene were to occur to reappoint  immediately and patient and caregiver understand the possibility for amputation is present in this particular situation  X-rays were negative for signs of calcification or pathology associated with this

## 2018-04-19 ENCOUNTER — Telehealth: Payer: Self-pay | Admitting: Podiatry

## 2018-04-19 NOTE — Telephone Encounter (Signed)
Glenda [name] called on behalf of Glenda [name].  Glenda [name] was seen in the office on 12/20 in the office by Dr.  Paulla Dolly.  She presented to the office for a painful scab which was removed from her big toe. Dr. Paulla Dolly evaluated this toe on Friday and did not feel there any danger to the toe.  Patient has a history of Diabetes with neuropathy,  PVD, CVA , and kidney disease.  Glenda [name] sent pictures to Glenda.  [name] and Glenda.  [name] says the blackness has noted  up the toe towards her foot.  The patient says there is no pain.  Glenda.  [name] denies chills or fever.  The pictures in Glenda [name] possession was sent to me for my evaluation.  The pictures taken on Friday reveal inflammation to the distal aspect og hallux.  The pictures today reveal blackening of the toe distal to the IPJ.  Desquamation noted second toe.  Due to her medical conditions and my examination of the pictures,  I recommended they keep an eye on the toe.  If the toe continues to blacken and worsen she should go to ED for evaluation and treatment.  I did recommend she get an earlier appointment with her vascular doctors.   Patient was told to call me back tomorrow if I can help.   Gardiner Barefoot DPM

## 2018-04-20 ENCOUNTER — Telehealth: Payer: Self-pay | Admitting: *Deleted

## 2018-04-20 NOTE — Telephone Encounter (Signed)
I spoke with pt's dtr, Runell Gess and she states her sister got pt in to see her Dr. Jimmye Norman this morning. Dr. Jimmye Norman got pt in to see [facility] doctor tomorrow morning.

## 2018-04-20 NOTE — Telephone Encounter (Signed)
Unable to leave a message on pt's home phone for pt to come in today. Left message on pt's mobile to call concerning a possible appt this afternoon and reminded pt of appt tomorrow 04/21/2018 at 8:30am at [facility].

## 2018-04-21 ENCOUNTER — Ambulatory Visit (INDEPENDENT_AMBULATORY_CARE_PROVIDER_SITE_OTHER): Payer: Medicare (Managed Care) | Admitting: Vascular Surgery

## 2018-04-21 ENCOUNTER — Other Ambulatory Visit (HOSPITAL_COMMUNITY): Payer: Self-pay | Admitting: Vascular Surgery

## 2018-04-21 ENCOUNTER — Ambulatory Visit (HOSPITAL_COMMUNITY)
Admission: RE | Admit: 2018-04-21 | Discharge: 2018-04-21 | Disposition: A | Discharge status: Home / Self Care | Payer: Medicare (Managed Care) | Source: Ambulatory Visit | Attending: Vascular Surgery | Admitting: Vascular Surgery

## 2018-04-21 ENCOUNTER — Other Ambulatory Visit: Payer: Self-pay | Admitting: *Deleted

## 2018-04-21 ENCOUNTER — Other Ambulatory Visit: Payer: Self-pay

## 2018-04-21 ENCOUNTER — Encounter: Payer: Self-pay | Admitting: Vascular Surgery

## 2018-04-21 ENCOUNTER — Encounter: Payer: Self-pay | Admitting: *Deleted

## 2018-04-21 DIAGNOSIS — I739 Peripheral vascular disease, unspecified: Secondary | ICD-10-CM | POA: Insufficient documentation

## 2018-04-21 NOTE — Progress Notes (Signed)
Patient name: Glenda [name] MRN: [id number] DOB: 30-Jul-1933 Sex: female  REASON FOR CONSULT: BLE tissue loss  HPI: Glenda [name] is a 84 y.o. female, with history of end-stage renal disease on hemodialysis, coronary artery disease, CHF, hypertension, diabetes, peripheral vascular disease who presents for evaluation of bilateral lower extremity tissue loss.  The patient's left great toe wound is significantly worse than her right great toe.  Family states this has been ongoing for the last 6 months.  They noticed that it has gotten worse in the last couple weeks and she saw her primary care doctor who moved up her appointment to today here with [facility].  She denies any significant rest pain.  Patient is in a wheelchair today but states she does walk with a walker.  She denies any previous lower extremity interventions.  No previous bypasses or arteriograms.  She has a left upper extremity fistula and dialyzes on Monday Wednesday Friday.  Past Medical History:  Diagnosis Date  . Arthritis   . Breast cancer ([place])   . CAD (coronary artery disease)   . Chest pain   . CHF (congestive heart failure) ([place])   . Chronic kidney disease    stage 4  . Dizziness   . DM (diabetes mellitus) ([place])   . GERD (gastroesophageal reflux disease)   . HLD (hyperlipidemia)   . HTN (hypertension)   . Obesity   . PAF (paroxysmal atrial fibrillation) ([place])   . Radiation 09/11/15-10/13/15   42.72 Gy to right breast, 12 Gy boost to right breast  . Shortness of breath dyspnea   . Sleep apnea    no longer uses cpap   . Stroke [facility])    balance issues - walks with walker and uses wheelchair   . UTI (urinary tract infection)     Past Surgical History:  Procedure Laterality Date  . AV FISTULA PLACEMENT Left 07/14/2017   Procedure: Left arm Brachicephalic Fistula Creation;  Surgeon: Serafina Mitchell, MD;  Location: [place];  Service: Vascular;  Laterality: Left;  . BALLOON DILATION N/A 08/18/2017   Procedure: BALLOON  DILATION;  Surgeon: Clarene Essex, MD;  Location: [place];  Service: Endoscopy;  Laterality: N/A;  . BASCILIC VEIN TRANSPOSITION Left 10/12/2017   Procedure: SECOND STAGE BASILIC VEIN TRANSPOSITION LEFT ARM;  Surgeon: Serafina Mitchell, MD;  Location: [place];  Service: Vascular;  Laterality: Left;  . CHOLECYSTECTOMY    . colon polyps; hx    . CORONARY ANGIOPLASTY  02/14/09  . ESOPHAGOGASTRODUODENOSCOPY (EGD) WITH PROPOFOL Left 09/08/2015   Procedure: ESOPHAGOGASTRODUODENOSCOPY (EGD) WITH PROPOFOL;  Surgeon: Clarene Essex, MD;  Location: [facility] ENDOSCOPY;  Service: Endoscopy;  Laterality: Left;  . ESOPHAGOGASTRODUODENOSCOPY (EGD) WITH PROPOFOL N/A 08/18/2017   Procedure: ESOPHAGOGASTRODUODENOSCOPY (EGD) WITH PROPOFOL;  Surgeon: Clarene Essex, MD;  Location: [place];  Service: Endoscopy;  Laterality: N/A;  with flouroscopy  . IR FLUORO GUIDE CV LINE RIGHT  08/25/2017  . IR FLUORO GUIDE CV LINE RIGHT  08/29/2017  . IR US GUIDE VASC ACCESS RIGHT  08/25/2017  . RADIOACTIVE SEED GUIDED PARTIAL MASTECTOMY WITH AXILLARY SENTINEL LYMPH NODE BIOPSY Right 07/17/2015   Procedure: RADIOACTIVE SEED GUIDED PARTIAL MASTECTOMY WITH AXILLARY SENTINEL LYMPH NODE BIOPSY;  Surgeon: Erroll Luna, MD;  Location: [place];  Service: General;  Laterality: Right;    Family History  Problem Relation Age of Onset  . Diabetes Mother   . Colon cancer Father        also had lung  . Diabetes Other   .  Heart attack Other   . Diabetes Sister        [name]  . Breast cancer Sister     SOCIAL HISTORY: Social History   Socioeconomic History  . Marital status: Widowed    Spouse name: Not on file  . Number of children: 5  . Years of education: 41  . Highest education level: Not on file  Occupational History  . Occupation: Retired  Scientific laboratory technician  . Financial resource strain: Not on file  . Food insecurity:    Worry: Not on file    Inability: Not on file  . Transportation needs:    Medical: Not on file    Non-medical: Not on  file  Tobacco Use  . Smoking status: Never Smoker  . Smokeless tobacco: Never Used  Substance and Sexual Activity  . Alcohol use: No    Alcohol/week: 0.0 standard drinks  . Drug use: No  . Sexual activity: Not Currently  Lifestyle  . Physical activity:    Days per week: Not on file    Minutes per session: Not on file  . Stress: Not on file  Relationships  . Social connections:    Talks on phone: Not on file    Gets together: Not on file    Attends religious service: Not on file    Active member of club or organization: Not on file    Attends meetings of clubs or organizations: Not on file    Relationship status: Not on file  . Intimate partner violence:    Fear of current or ex partner: Not on file    Emotionally abused: Not on file    Physically abused: Not on file    Forced sexual activity: Not on file  Other Topics Concern  . Not on file  Social History Narrative   Retired.    Lives with daughter   Caffeine use: Drinks soda and coffee and tea daily    No Known Allergies  Current Outpatient Medications  Medication Sig Dispense Refill  . aspirin EC 81 MG tablet Take 81 mg by mouth every evening.     . citalopram (CELEXA) 20 MG tablet Take 20 mg by mouth every evening.     . cloNIDine (CATAPRES) 0.1 MG tablet Take 0.1 mg by mouth 2 (two) times daily.    Marland Kitchen GLUCERNA (GLUCERNA) LIQD Take 237 mLs by mouth daily as needed (for poor intake).    Marland Kitchen HUMALOG MIX 50/50 KWIKPEN (50-50) 100 UNIT/ML Kwikpen Inject 18-20 Units into the skin 2 (two) times daily. Inject 20 units in the morning and 18 units at bedtime; hold for blood sugar readings lower than 200  11  . isosorbide mononitrate (IMDUR) 30 MG 24 hr tablet Take 1 tablet (30 mg total) daily by mouth. (Patient taking differently: Take 30 mg by mouth every evening. ) 30 tablet 0  . metoprolol succinate (TOPROL-XL) 25 MG 24 hr tablet Take 1 tablet (25 mg total) by mouth daily. (Patient taking differently: Take 25 mg by mouth every  evening. ) 30 tablet 0  . pantoprazole (PROTONIX) 40 MG tablet Take 40 mg by mouth every evening.     . pravastatin (PRAVACHOL) 80 MG tablet Take 80 mg by mouth at bedtime.     . simethicone (GAS-X) 80 MG chewable tablet Chew 80 mg by mouth every 6 (six) hours as needed for flatulence.    . cetirizine (ZYRTEC) 10 MG tablet Take 10 mg by mouth every evening.     Marland Kitchen  famotidine (PEPCID AC) 10 MG chewable tablet Chew 10 mg by mouth daily as needed for heartburn.    . warfarin (COUMADIN) 5 MG tablet Take 1.5 tablets (7.5 mg total) by mouth one time only at 6 PM. (Patient not taking: Reported on 04/21/2018) 30 tablet 0   No current facility-administered medications for this visit.     REVIEW OF SYSTEMS:  [X]  denotes positive finding, [ ]  denotes negative finding Cardiac  Comments:  Chest pain or chest pressure:    Shortness of breath upon exertion:    Short of breath when lying flat:    Irregular heart rhythm:        Vascular    Pain in calf, thigh, or hip brought on by ambulation:    Pain in feet at night that wakes you up from your sleep:     Blood clot in your veins:    Leg swelling:         Pulmonary    Oxygen at home:    Productive cough:     Wheezing:         Neurologic    Sudden weakness in arms or legs:     Sudden numbness in arms or legs:     Sudden onset of difficulty speaking or slurred speech:    Temporary loss of vision in one eye:     Problems with dizziness:         Gastrointestinal    Blood in stool:     Vomited blood:         Genitourinary    Burning when urinating:     Blood in urine:        Psychiatric    Major depression:         Hematologic    Bleeding problems:    Problems with blood clotting too easily:        Skin    Rashes or ulcers:        Constitutional    Fever or chills:      PHYSICAL EXAM: Vitals:   04/21/18 0830  BP: (!) 119/57  Pulse: 61  Resp: 20  Temp: (!) 97.3 F (36.3 C)  SpO2: 100%  Weight: 145 lb (65.8 kg)  Height: 5'  6" (1.676 m)    GENERAL: The patient is a well-nourished female, in no acute distress. The vital signs are documented above. CARDIAC: There is a regular rate and rhythm.  VASCULAR:  2+ right radial pulse, no left radial pulse palpable, left upper arm basilic vein fistula with good thrill 2+ right femoral pulse, 1+ left femoral pulse Biphasic R DP signal Biphasic L DP/PT signal Dry gangrene of left great toe Ulcer on tip of right great toe PULMONARY: There is good air exchange bilaterally without wheezing or rales. ABDOMEN: Soft and non-tender with normal pitched bowel sounds.  MUSCULOSKELETAL: There are no major deformities or cyanosis. NEUROLOGIC: No focal weakness or paresthesias are detected. SKIN: There are no ulcers or rashes noted. PSYCHIATRIC: The patient has a normal affect.  DATA:   None  Assessment/Plan:  84 year old female with end-stage renal disease on hemodialysis and diabetes that presents with critical limb ischemia of her bilateral lower extremities with tissue loss.  She has dry gangrene of her left great toe with another smaller ulcer on her right great toe tip - all progressing over past 6 months.  Discussed with her family that this is a limb threatening situation.  I think we will start with an  arteriogram of her bilateral lower extremities next week at next available date.  Given that her left great toe is significantly worse, plan is to stick her right common femoral artery shoot the runoff of both legs and try and intervene on the left leg first.  Discussed possible endovascular intervention but also she may have small vessel disease and there is nothing that is amendable to intervention.  Confirmed with patient that she is not currently on Coumadin.  Will get baseline ABI's today.     [name], MD [facility] Office: [phone] Pager: [phone]

## 2018-04-27 ENCOUNTER — Encounter (HOSPITAL_COMMUNITY): Payer: Self-pay | Admitting: *Deleted

## 2018-04-27 ENCOUNTER — Telehealth: Payer: Self-pay | Admitting: *Deleted

## 2018-04-27 ENCOUNTER — Other Ambulatory Visit: Payer: Self-pay

## 2018-04-27 ENCOUNTER — Ambulatory Visit (HOSPITAL_COMMUNITY)
Admission: RE | Admit: 2018-04-27 | Discharge: 2018-04-27 | Disposition: A | Discharge status: Home / Self Care | Payer: Medicare (Managed Care) | Source: Home / Self Care | Attending: Vascular Surgery | Admitting: Vascular Surgery

## 2018-04-27 ENCOUNTER — Encounter: Payer: Self-pay | Admitting: *Deleted

## 2018-04-27 ENCOUNTER — Encounter (HOSPITAL_COMMUNITY)
Admission: RE | Disposition: A | Discharge status: Home / Self Care | Payer: Self-pay | Source: Home / Self Care | Attending: Vascular Surgery

## 2018-04-27 ENCOUNTER — Other Ambulatory Visit: Payer: Self-pay | Admitting: *Deleted

## 2018-04-27 DIAGNOSIS — E669 Obesity, unspecified: Secondary | ICD-10-CM | POA: Insufficient documentation

## 2018-04-27 DIAGNOSIS — I509 Heart failure, unspecified: Secondary | ICD-10-CM

## 2018-04-27 DIAGNOSIS — Z992 Dependence on renal dialysis: Secondary | ICD-10-CM | POA: Insufficient documentation

## 2018-04-27 DIAGNOSIS — E1122 Type 2 diabetes mellitus with diabetic chronic kidney disease: Secondary | ICD-10-CM

## 2018-04-27 DIAGNOSIS — I998 Other disorder of circulatory system: Secondary | ICD-10-CM | POA: Diagnosis not present

## 2018-04-27 DIAGNOSIS — E785 Hyperlipidemia, unspecified: Secondary | ICD-10-CM | POA: Insufficient documentation

## 2018-04-27 DIAGNOSIS — I132 Hypertensive heart and chronic kidney disease with heart failure and with stage 5 chronic kidney disease, or end stage renal disease: Secondary | ICD-10-CM

## 2018-04-27 DIAGNOSIS — Z7982 Long term (current) use of aspirin: Secondary | ICD-10-CM | POA: Insufficient documentation

## 2018-04-27 DIAGNOSIS — M1990 Unspecified osteoarthritis, unspecified site: Secondary | ICD-10-CM | POA: Insufficient documentation

## 2018-04-27 DIAGNOSIS — G4730 Sleep apnea, unspecified: Secondary | ICD-10-CM | POA: Insufficient documentation

## 2018-04-27 DIAGNOSIS — L97519 Non-pressure chronic ulcer of other part of right foot with unspecified severity: Secondary | ICD-10-CM | POA: Insufficient documentation

## 2018-04-27 DIAGNOSIS — I70235 Atherosclerosis of native arteries of right leg with ulceration of other part of foot: Secondary | ICD-10-CM | POA: Insufficient documentation

## 2018-04-27 DIAGNOSIS — I70262 Atherosclerosis of native arteries of extremities with gangrene, left leg: Secondary | ICD-10-CM | POA: Insufficient documentation

## 2018-04-27 DIAGNOSIS — K219 Gastro-esophageal reflux disease without esophagitis: Secondary | ICD-10-CM | POA: Insufficient documentation

## 2018-04-27 DIAGNOSIS — Z7901 Long term (current) use of anticoagulants: Secondary | ICD-10-CM

## 2018-04-27 DIAGNOSIS — E1152 Type 2 diabetes mellitus with diabetic peripheral angiopathy with gangrene: Secondary | ICD-10-CM | POA: Insufficient documentation

## 2018-04-27 DIAGNOSIS — N186 End stage renal disease: Secondary | ICD-10-CM

## 2018-04-27 DIAGNOSIS — I7092 Chronic total occlusion of artery of the extremities: Secondary | ICD-10-CM

## 2018-04-27 DIAGNOSIS — E11621 Type 2 diabetes mellitus with foot ulcer: Secondary | ICD-10-CM

## 2018-04-27 DIAGNOSIS — Z8673 Personal history of transient ischemic attack (TIA), and cerebral infarction without residual deficits: Secondary | ICD-10-CM | POA: Insufficient documentation

## 2018-04-27 DIAGNOSIS — Z794 Long term (current) use of insulin: Secondary | ICD-10-CM

## 2018-04-27 DIAGNOSIS — I2510 Atherosclerotic heart disease of native coronary artery without angina pectoris: Secondary | ICD-10-CM | POA: Insufficient documentation

## 2018-04-27 HISTORY — PX: ABDOMINAL AORTOGRAM W/LOWER EXTREMITY: CATH118223

## 2018-04-27 LAB — POCT I-STAT, CHEM 8
BUN: 38 mg/dL — ABNORMAL HIGH (ref 8–23)
Calcium, Ion: 1.19 mmol/L (ref 1.15–1.40)
Chloride: 99 mmol/L (ref 98–111)
Creatinine, Ser: 5.3 mg/dL — ABNORMAL HIGH (ref 0.44–1.00)
Glucose, Bld: 105 mg/dL — ABNORMAL HIGH (ref 70–99)
HEMATOCRIT: 40 % (ref 36.0–46.0)
Hemoglobin: 13.6 g/dL (ref 12.0–15.0)
POTASSIUM: 3.3 mmol/L — AB (ref 3.5–5.1)
Sodium: 137 mmol/L (ref 135–145)
TCO2: 31 mmol/L (ref 22–32)

## 2018-04-27 LAB — GLUCOSE, CAPILLARY: Glucose-Capillary: 125 mg/dL — ABNORMAL HIGH (ref 70–99)

## 2018-04-27 SURGERY — ABDOMINAL AORTOGRAM W/LOWER EXTREMITY
Anesthesia: LOCAL

## 2018-04-27 MED ORDER — SODIUM CHLORIDE 0.9% FLUSH: 3.0000 mL | Freq: Two times a day (BID) | INTRAVENOUS | Status: DC

## 2018-04-27 MED ORDER — LABETALOL HCL 5 MG/ML IV SOLN: 10.0000 mg | INTRAVENOUS | Status: DC | PRN

## 2018-04-27 MED ORDER — ONDANSETRON HCL 4 MG/2ML IJ SOLN: 4.0000 mg | Freq: Four times a day (QID) | INTRAMUSCULAR | Status: DC | PRN

## 2018-04-27 MED ORDER — HEPARIN (PORCINE) IN NACL 1000-0.9 UT/500ML-% IV SOLN
INTRAVENOUS | Status: AC
  Filled 2018-04-27: qty 1000

## 2018-04-27 MED ORDER — SODIUM CHLORIDE 0.9 % IV SOLN: 250.0000 mL | INTRAVENOUS | Status: DC | PRN

## 2018-04-27 MED ORDER — LIDOCAINE HCL (PF) 1 % IJ SOLN
INTRAMUSCULAR | Status: DC | PRN
  Administered 2018-04-27: 15 mL

## 2018-04-27 MED ORDER — HEPARIN (PORCINE) IN NACL 1000-0.9 UT/500ML-% IV SOLN
INTRAVENOUS | Status: DC | PRN
  Administered 2018-04-27 (×2): 500 mL

## 2018-04-27 MED ORDER — SODIUM CHLORIDE 0.9% FLUSH: 3.0000 mL | INTRAVENOUS | Status: DC | PRN

## 2018-04-27 MED ORDER — HYDRALAZINE HCL 20 MG/ML IJ SOLN
INTRAMUSCULAR | Status: DC | PRN
  Administered 2018-04-27: 10 mg via INTRAVENOUS

## 2018-04-27 MED ORDER — ONDANSETRON HCL 4 MG/2ML IJ SOLN
INTRAMUSCULAR | Status: DC | PRN
  Administered 2018-04-27: 4 mg via INTRAVENOUS

## 2018-04-27 MED ORDER — ONDANSETRON HCL 4 MG/2ML IJ SOLN
INTRAMUSCULAR | Status: AC
  Filled 2018-04-27: qty 2

## 2018-04-27 MED ORDER — IODIXANOL 320 MG/ML IV SOLN
INTRAVENOUS | Status: DC | PRN
  Administered 2018-04-27: 150 mL via INTRA_ARTERIAL

## 2018-04-27 MED ORDER — HYDRALAZINE HCL 20 MG/ML IJ SOLN
INTRAMUSCULAR | Status: AC
  Filled 2018-04-27: qty 1

## 2018-04-27 MED ORDER — LIDOCAINE HCL (PF) 1 % IJ SOLN
INTRAMUSCULAR | Status: AC
  Filled 2018-04-27: qty 30

## 2018-04-27 MED ORDER — HEPARIN SODIUM (PORCINE) 1000 UNIT/ML IJ SOLN
INTRAMUSCULAR | Status: AC
  Filled 2018-04-27: qty 1

## 2018-04-27 MED ORDER — HEPARIN SODIUM (PORCINE) 1000 UNIT/ML IJ SOLN
INTRAMUSCULAR | Status: DC | PRN
  Administered 2018-04-27: 2000 [IU] via INTRAVENOUS
  Administered 2018-04-27: 7000 [IU] via INTRAVENOUS

## 2018-04-27 MED ORDER — ACETAMINOPHEN 325 MG PO TABS: 650.0000 mg | ORAL_TABLET | ORAL | Status: DC | PRN

## 2018-04-27 MED ORDER — HYDRALAZINE HCL 20 MG/ML IJ SOLN: 5.0000 mg | INTRAMUSCULAR | Status: DC | PRN

## 2018-04-27 SURGICAL SUPPLY — 23 items
BAG SNAP BAND KOVER 36X36 (MISCELLANEOUS) ×1 IMPLANT
CATH CROSS OVER TEMPO 5F (CATHETERS) ×1 IMPLANT
CATH CXI 2.6F 65 ST (CATHETERS) ×2
CATH CXI SUPP 2.6F 150 ST (CATHETERS) ×1 IMPLANT
CATH OMNI FLUSH 5F 65CM (CATHETERS) ×1 IMPLANT
CATH SPRT STRG 65X2.6FR ACPT (CATHETERS) IMPLANT
CATH STRAIGHT 5FR 65CM (CATHETERS) ×1 IMPLANT
CLOSURE MYNX CONTROL 6F/7F (Vascular Products) ×1 IMPLANT
KIT MICROPUNCTURE NIT STIFF (SHEATH) ×1 IMPLANT
KIT PV (KITS) ×2 IMPLANT
PATCH THROMBIX TOPICAL PLAIN (HEMOSTASIS) ×1 IMPLANT
SHEATH FLEX ANSEL ST 6FR 45CM (SHEATH) ×1 IMPLANT
SHEATH MICROPUNCTURE PEDAL 4FR (SHEATH) ×1 IMPLANT
SHEATH PINNACLE 5F 10CM (SHEATH) ×1 IMPLANT
SHEATH PINNACLE 6F 10CM (SHEATH) ×1 IMPLANT
SHEATH PROBE COVER 6X72 (BAG) ×2 IMPLANT
SHIELD RADPAD SCOOP 12X17 (MISCELLANEOUS) ×1 IMPLANT
SYR MEDRAD MARK 7 150ML (SYRINGE) ×2 IMPLANT
TRANSDUCER W/STOPCOCK (MISCELLANEOUS) ×2 IMPLANT
TRAY PV CATH (CUSTOM PROCEDURE TRAY) ×2 IMPLANT
WIRE BENTSON .035X145CM (WIRE) ×1 IMPLANT
WIRE G V18X300CM (WIRE) ×2 IMPLANT
WIRE ROSEN-J .035X260CM (WIRE) ×1 IMPLANT

## 2018-04-27 NOTE — Progress Notes (Signed)
Dialysis clinic on [address] called and informed that Ms Buckwalter will not be ready to discharge until 1200 and could they do her a little later. Spoke with Jonni Sanger he states as long as  She gets there before 2 they should be able to do her hemo

## 2018-04-27 NOTE — Anesthesia Preprocedure Evaluation (Addendum)
Anesthesia Evaluation  Patient identified by MRN, date of birth, ID band Patient awake    Reviewed: Allergy & Precautions, NPO status , Patient's Chart, lab work & pertinent test results, reviewed documented beta blocker date and time   Airway Mallampati: II  TM Distance: >3 FB Neck ROM: Full    Dental  (+) Dental Advisory Given, Missing   Pulmonary shortness of breath, sleep apnea ,    Pulmonary exam normal breath sounds clear to auscultation       Cardiovascular hypertension, Pt. on medications and Pt. on home beta blockers + CAD, + Peripheral Vascular Disease and +CHF  Normal cardiovascular exam+ dysrhythmias Atrial Fibrillation  Rhythm:Regular Rate:Normal  Echo 08/21/17: Study Conclusions  - Left ventricle: The cavity size was normal. There was moderate concentric hypertrophy. Systolic function was normal. The estimated ejection fraction was in the range of 60% to 65%. Wall motion was normal; there were no regional wall motion abnormalities. Features are consistent with a pseudonormal left ventricular filling pattern, with concomitant abnormal relaxation   and increased filling pressure (grade 2 diastolic dysfunction). Doppler parameters are consistent with elevated ventricular end-diastolic filling pressure. - Mitral valve: Calcified annulus. Mildly thickened leaflets .   There was moderate regurgitation. - Left atrium: The atrium was moderately dilated. - Right ventricle: The cavity size was mildly dilated. Wall   thickness was normal. Systolic function was normal. - Tricuspid valve: There was mild regurgitation. - Pulmonary arteries: Systolic pressure was moderately increased. PA peak pressure: 47 mm Hg (S). - Inferior vena cava: The vessel was dilated. The respirophasic diameter changes were blunted (< 50%), consistent with elevated central venous pressure. - Pericardium, extracardiac: There was no pericardial effusion.    Neuro/Psych CVA, Residual Symptoms    GI/Hepatic Neg liver ROS, GERD  Medicated and Controlled,  Endo/Other  diabetes, Type 2, Insulin Dependent  Renal/GU ESRF and DialysisRenal disease     Musculoskeletal  (+) Arthritis ,   Abdominal   Peds  Hematology  (+) Blood dyscrasia (Warfarin), ,   Anesthesia Other Findings Day of surgery medications reviewed with the patient.  H/o breast cancer  Reproductive/Obstetrics                            Anesthesia Physical Anesthesia Plan  ASA: III  Anesthesia Plan: General   Post-op Pain Management:    Induction: Intravenous  PONV Risk Score and Plan: 3 and Dexamethasone, Ondansetron and Treatment may vary due to age or medical condition  Airway Management Planned: LMA  Additional Equipment:   Intra-op Plan:   Post-operative Plan: Extubation in OR  Informed Consent: I have reviewed the patients History and Physical, chart, labs and discussed the procedure including the risks, benefits and alternatives for the proposed anesthesia with the patient or authorized representative who has indicated his/her understanding and acceptance.   Dental advisory given  Plan Discussed with: CRNA  Anesthesia Plan Comments:        Anesthesia Quick Evaluation

## 2018-04-27 NOTE — H&P (Signed)
History and Physical Interval Note:  04/27/2018 7:38 AM  [name]  has presented today for surgery, with the diagnosis of pvd   gangrene  The various methods of treatment have been discussed with the patient and family. After consideration of risks, benefits and other options for treatment, the patient has consented to  Procedure(s): ABDOMINAL AORTOGRAM W/LOWER EXTREMITY (N/A) as a surgical intervention .  The patient's history has been reviewed, patient examined, no change in status, stable for surgery.  I have reviewed the patient's chart and labs.  Questions were answered to the patient's satisfaction.     Aortogram/BLE arteriogram - tissue loss.  [name]  Patient name: Glenda [name]         MRN: [id number]        DOB: 28-Mar-1934        Sex: female  REASON FOR CONSULT: BLE tissue loss  HPI: Glenda [name] is a 85 y.o. female, with history of end-stage renal disease on hemodialysis, coronary artery disease, CHF, hypertension, diabetes, peripheral vascular disease who presents for evaluation of bilateral lower extremity tissue loss.  The patient's left great toe wound is significantly worse than her right great toe.  Family states this has been ongoing for the last 6 months.  They noticed that it has gotten worse in the last couple weeks and she saw her primary care doctor who moved up her appointment to today here with [facility].  She denies any significant rest pain.  Patient is in a wheelchair today but states she does walk with a walker.  She denies any previous lower extremity interventions.  No previous bypasses or arteriograms.  She has a left upper extremity fistula and dialyzes on Monday Wednesday Friday.      Past Medical History:  Diagnosis Date  . Arthritis   . Breast cancer ([place])   . CAD (coronary artery disease)   . Chest pain   . CHF (congestive heart failure) ([place])   . Chronic kidney disease    stage 4  . Dizziness   . DM (diabetes mellitus)  ([place])   . GERD (gastroesophageal reflux disease)   . HLD (hyperlipidemia)   . HTN (hypertension)   . Obesity   . PAF (paroxysmal atrial fibrillation) ([place])   . Radiation 09/11/15-10/13/15   42.72 Gy to right breast, 12 Gy boost to right breast  . Shortness of breath dyspnea   . Sleep apnea    no longer uses cpap   . Stroke [facility])    balance issues - walks with walker and uses wheelchair   . UTI (urinary tract infection)     Past Surgical History:  Procedure Laterality Date  . AV FISTULA PLACEMENT Left 07/14/2017   Procedure: Left arm Brachicephalic Fistula Creation;  Surgeon: Serafina Mitchell, MD;  Location: [place];  Service: Vascular;  Laterality: Left;  . BALLOON DILATION N/A 08/18/2017   Procedure: BALLOON DILATION;  Surgeon: Clarene Essex, MD;  Location: [facility];  Service: Endoscopy;  Laterality: N/A;  . BASCILIC VEIN TRANSPOSITION Left 10/12/2017   Procedure: SECOND STAGE BASILIC VEIN TRANSPOSITION LEFT ARM;  Surgeon: Serafina Mitchell, MD;  Location: [place];  Service: Vascular;  Laterality: Left;  . CHOLECYSTECTOMY    . colon polyps; hx    . CORONARY ANGIOPLASTY  02/14/09  . ESOPHAGOGASTRODUODENOSCOPY (EGD) WITH PROPOFOL Left 09/08/2015   Procedure: ESOPHAGOGASTRODUODENOSCOPY (EGD) WITH PROPOFOL;  Surgeon: Clarene Essex, MD;  Location: [facility] END[facility];  Service: Endoscopy;  Laterality: Left;  . ESOPHAGOGASTRODUODENOSCOPY (EGD)  WITH PROPOFOL N/A 08/18/2017   Procedure: ESOPHAGOGASTRODUODENOSCOPY (EGD) WITH PROPOFOL;  Surgeon: Clarene Essex, MD;  Location: [place];  Service: Endoscopy;  Laterality: N/A;  with flouroscopy  . IR FLUORO GUIDE CV LINE RIGHT  08/25/2017  . IR FLUORO GUIDE CV LINE RIGHT  08/29/2017  . IR US GUIDE VASC ACCESS RIGHT  08/25/2017  . RADIOACTIVE SEED GUIDED PARTIAL MASTECTOMY WITH AXILLARY SENTINEL LYMPH NODE BIOPSY Right 07/17/2015   Procedure: RADIOACTIVE SEED GUIDED PARTIAL MASTECTOMY WITH AXILLARY SENTINEL LYMPH NODE BIOPSY;  Surgeon:  Erroll Luna, MD;  Location: [place];  Service: General;  Laterality: Right;         Family History  Problem Relation Age of Onset  . Diabetes Mother   . Colon cancer Father        also had lung  . Diabetes Other   . Heart attack Other   . Diabetes Sister        [name]  . Breast cancer Sister     SOCIAL HISTORY: Social History        Socioeconomic History  . Marital status: Widowed    Spouse name: Not on file  . Number of children: 5  . Years of education: 34  . Highest education level: Not on file  Occupational History  . Occupation: Retired  Scientific laboratory technician  . Financial resource strain: Not on file  . Food insecurity:    Worry: Not on file    Inability: Not on file  . Transportation needs:    Medical: Not on file    Non-medical: Not on file  Tobacco Use  . Smoking status: Never Smoker  . Smokeless tobacco: Never Used  Substance and Sexual Activity  . Alcohol use: No    Alcohol/week: 0.0 standard drinks  . Drug use: No  . Sexual activity: Not Currently  Lifestyle  . Physical activity:    Days per week: Not on file    Minutes per session: Not on file  . Stress: Not on file  Relationships  . Social connections:    Talks on phone: Not on file    Gets together: Not on file    Attends religious service: Not on file    Active member of club or organization: Not on file    Attends meetings of clubs or organizations: Not on file    Relationship status: Not on file  . Intimate partner violence:    Fear of current or ex partner: Not on file    Emotionally abused: Not on file    Physically abused: Not on file    Forced sexual activity: Not on file  Other Topics Concern  . Not on file  Social History Narrative   Retired.    Lives with daughter   Caffeine use: Drinks soda and coffee and tea daily    No Known Allergies        Current Outpatient Medications  Medication Sig Dispense Refill  . aspirin EC 81  MG tablet Take 81 mg by mouth every evening.     . citalopram (CELEXA) 20 MG tablet Take 20 mg by mouth every evening.     . cloNIDine (CATAPRES) 0.1 MG tablet Take 0.1 mg by mouth 2 (two) times daily.    Marland Kitchen GLUCERNA (GLUCERNA) LIQD Take 237 mLs by mouth daily as needed (for poor intake).    Marland Kitchen HUMALOG MIX 50/50 KWIKPEN (50-50) 100 UNIT/ML Kwikpen Inject 18-20 Units into the skin 2 (two) times daily. Inject 20 units in  the morning and 18 units at bedtime; hold for blood sugar readings lower than 200  11  . isosorbide mononitrate (IMDUR) 30 MG 24 hr tablet Take 1 tablet (30 mg total) daily by mouth. (Patient taking differently: Take 30 mg by mouth every evening. ) 30 tablet 0  . metoprolol succinate (TOPROL-XL) 25 MG 24 hr tablet Take 1 tablet (25 mg total) by mouth daily. (Patient taking differently: Take 25 mg by mouth every evening. ) 30 tablet 0  . pantoprazole (PROTONIX) 40 MG tablet Take 40 mg by mouth every evening.     . pravastatin (PRAVACHOL) 80 MG tablet Take 80 mg by mouth at bedtime.     . simethicone (GAS-X) 80 MG chewable tablet Chew 80 mg by mouth every 6 (six) hours as needed for flatulence.    . cetirizine (ZYRTEC) 10 MG tablet Take 10 mg by mouth every evening.     . famotidine (PEPCID AC) 10 MG chewable tablet Chew 10 mg by mouth daily as needed for heartburn.    . warfarin (COUMADIN) 5 MG tablet Take 1.5 tablets (7.5 mg total) by mouth one time only at 6 PM. (Patient not taking: Reported on 04/21/2018) 30 tablet 0   No current facility-administered medications for this visit.     REVIEW OF SYSTEMS:  [X]  denotes positive finding, [ ]  denotes negative finding Cardiac  Comments:  Chest pain or chest pressure:    Shortness of breath upon exertion:    Short of breath when lying flat:    Irregular heart rhythm:        Vascular    Pain in calf, thigh, or hip brought on by ambulation:    Pain in feet at night that wakes you up from your  sleep:     Blood clot in your veins:    Leg swelling:         Pulmonary    Oxygen at home:    Productive cough:     Wheezing:         Neurologic    Sudden weakness in arms or legs:     Sudden numbness in arms or legs:     Sudden onset of difficulty speaking or slurred speech:    Temporary loss of vision in one eye:     Problems with dizziness:         Gastrointestinal    Blood in stool:     Vomited blood:         Genitourinary    Burning when urinating:     Blood in urine:        Psychiatric    Major depression:         Hematologic    Bleeding problems:    Problems with blood clotting too easily:        Skin    Rashes or ulcers:        Constitutional    Fever or chills:      PHYSICAL EXAM:    Vitals:   04/21/18 0830  BP: (!) 119/57  Pulse: 61  Resp: 20  Temp: (!) 97.3 F (36.3 C)  SpO2: 100%  Weight: 145 lb (65.8 kg)  Height: 5\' 6"  (1.676 m)    GENERAL: The patient is a well-nourished female, in no acute distress. The vital signs are documented above. CARDIAC: There is a regular rate and rhythm.  VASCULAR:  2+ right radial pulse, no left radial pulse palpable, left upper arm basilic vein fistula with  good thrill 2+ right femoral pulse, 1+ left femoral pulse Biphasic R DP signal Biphasic L DP/PT signal Dry gangrene of left great toe Ulcer on tip of right great toe PULMONARY: There is good air exchange bilaterally without wheezing or rales. ABDOMEN: Soft and non-tender with normal pitched bowel sounds.  MUSCULOSKELETAL: There are no major deformities or cyanosis. NEUROLOGIC: No focal weakness or paresthesias are detected. SKIN: There are no ulcers or rashes noted. PSYCHIATRIC: The patient has a normal affect.  DATA:   None  Assessment/Plan:  85 year old female with end-stage renal disease on hemodialysis and diabetes that presents with critical limb ischemia  of her bilateral lower extremities with tissue loss.  She has dry gangrene of her left great toe with another smaller ulcer on her right great toe tip - all progressing over past 6 months.  Discussed with her family that this is a limb threatening situation.  I think we will start with an arteriogram of her bilateral lower extremities next week at next available date.  Given that her left great toe is significantly worse, plan is to stick her right common femoral artery shoot the runoff of both legs and try and intervene on the left leg first.  Discussed possible endovascular intervention but also she may have small vessel disease and there is nothing that is amendable to intervention.  Confirmed with patient that she is not currently on Coumadin.  Will get baseline ABI's today.     [name], MD [facility] Office: [phone] Pager: [phone]

## 2018-04-27 NOTE — Progress Notes (Signed)
Phone call to [facility] and to [facility]. Pre-Op letter faxed to all above locations. Notified of pending procedure and they will get pre-op instructions to patient. Per Dr. Ainsley Spinner note patient is not on Coumadin. Message left for patient to call this office to review instructions. Tara at [facility]  notified of add on of am surgery.

## 2018-04-27 NOTE — Discharge Instructions (Signed)
Femoral Site Care °This sheet gives you information about how to care for yourself after your procedure. Your health care provider may also give you more specific instructions. If you have problems or questions, contact your health care provider. °What can I expect after the procedure? °After the procedure, it is common to have: °· Bruising that usually fades within 1-2 weeks. °· Tenderness at the site. °Follow these instructions at home: °Wound care °· Follow instructions from your health care provider about how to take care of your insertion site. Make sure you: °? Wash your hands with soap and water before you change your bandage (dressing). If soap and water are not available, use hand sanitizer. °? Change your dressing as told by your health care provider. °? Leave stitches (sutures), skin glue, or adhesive strips in place. These skin closures may need to stay in place for 2 weeks or longer. If adhesive strip edges start to loosen and curl up, you may trim the loose edges. Do not remove adhesive strips completely unless your health care provider tells you to do that. °· Do not take baths, swim, or use a hot tub until your health care provider approves. °· You may shower 24-48 hours after the procedure or as told by your health care provider. °? Gently wash the site with plain soap and water. °? Pat the area dry with a clean towel. °? Do not rub the site. This may cause bleeding. °· Do not apply powder or lotion to the site. Keep the site clean and dry. °· Check your femoral site every day for signs of infection. Check for: °? Redness, swelling, or pain. °? Fluid or blood. °? Warmth. °? Pus or a bad smell. °Activity °· For the first 2-3 days after your procedure, or as long as directed: °? Avoid climbing stairs as much as possible. °? Do not squat. °· Do not lift anything that is heavier than 10 lb (4.5 kg), or the limit that you are told, until your health care provider says that it is safe. °· Rest as  directed. °? Avoid sitting for a long time without moving. Get up to take short walks every 1-2 hours. °· Do not drive for 24 hours if you were given a medicine to help you relax (sedative). °General instructions °· Take over-the-counter and prescription medicines only as told by your health care provider. °· Keep all follow-up visits as told by your health care provider. This is important. °Contact a health care provider if you have: °· A fever or chills. °· You have redness, swelling, or pain around your insertion site. °Get help right away if: °· The catheter insertion area swells very fast. °· You pass out. °· You suddenly start to sweat or your skin gets clammy. °· The catheter insertion area is bleeding, and the bleeding does not stop when you hold steady pressure on the area. °· The area near or just beyond the catheter insertion site becomes pale, cool, tingly, or numb. °These symptoms may represent a serious problem that is an emergency. Do not wait to see if the symptoms will go away. Get medical help right away. Call your local emergency services (911 in the [place] Do not drive yourself to the hospital. °Summary °· After the procedure, it is common to have bruising that usually fades within 1-2 weeks. °· Check your femoral site every day for signs of infection. °· Do not lift anything that is heavier than 10 lb (4.5 kg), or the   limit that you are told, until your health care provider says that it is safe. °This information is not intended to replace advice given to you by your health care provider. Make sure you discuss any questions you have with your health care provider. °Document Released: 12/14/2013 Document Revised: 04/25/2017 Document Reviewed: 04/25/2017 °Elsevier Interactive Patient Education © 2019 [employer] ° °

## 2018-04-27 NOTE — Telephone Encounter (Signed)
Spoke with patient's daughter to be at [facility] admitting at 5:30 am on 04/28/2018 for surgery. NPO past MN tonight and follow the instructions received from pre-admitting. Number given to call [facility].

## 2018-04-27 NOTE — Op Note (Signed)
Patient name: Glenda [name] MRN: [id number] DOB: 12/04/1933 Sex: female  04/27/2018 Pre-operative Diagnosis: Critical limb ischemia of the bilateral lower extremities with tissue loss Post-operative diagnosis:  Same Surgeon:  [name], MD Procedure Performed: 1.  Ultrasound-guided access of the right common femoral artery 2.  Aortogram with bilateral lower extremity arteriogram including selection of tertiary branches in the left lower extremity 3.  Ultrasound-guided retrograde left anterior tibial artery access at the ankle 4.  Mynx closure of the right common femoral artery  Indications: Patient is an 85 year old female with multiple medical co-morbidities including ESRD and DM who recently presented to clinic with dry gangrene of her left great toe as well as a small ulcer developing on her right great toe.  She had monophasic waveforms at the ankles.  Subsequently recommended aortogram with bilateral lower extremity arteriogram after risks benefits were discussed.  Findings:  Aortogram revealed no significant aortoiliac disease.  Bilateral renal arteries were patent.   Left lower extremity arteriogram revealed heavily calcified but patent common femoral, profunda, SFA, above and below-knee popliteal artery.  Patient had severe tibial disease in the left lower extremity had single vessel runoff via the peroneal artery with collaterals at the ankle that filled the anterior tibial and posterior tibial arteries just above the ankle with filling of the plantar arch and dorsalis pedis.  The anterior tibial and posterior tibial arteries both occluded shortly after takeoff and were occluded through much of the calf until collaterals at the ankle off the peroneal reconstituted each artery.  There was significant small vessel disease in the foot. Right lower extremity arteriogram again revealed heavily calcified but patent common femoral, profunda, SFA, above and below-knee popliteal  artery.  Patient had single-vessel runoff via heavily diseased anterior tibial that had greater than 80% stenosis throughout its proximal to mid course in multiple segments and was heavily calcified.  The peroneal and the posterior tibial appeared to occlude shortly after takeoff.  Significant small vessel disease in the foot.   Procedure:  The patient was identified in the holding area and taken to room 8.  The patient was then placed supine on the table and prepped and draped in the usual sterile fashion.  A time out was called.  Ultrasound was used to evaluate the right common femoral artery.  It was patent .  A digital ultrasound image was acquired.  A micropuncture needle was used to access the right common femoral artery under ultrasound guidance.  An 018 wire was advanced without resistance and a micropuncture sheath was placed.  The 018 wire was removed and a benson wire was placed.  The micropuncture sheath was exchanged for a 5 french sheath.  An omniflush catheter was advanced over the wire to the level of L-1.  An abdominal angiogram was obtained.  Next the Omni Flush catheter was pulled down to the aortic bifurcation and bilateral lower extremity arteriogram was obtained with runoff.  Given that the patient had severe tibial disease on the left which was the leg we planned intervention for today given the worse tissue loss I went ahead and selected the contralateral common iliac with a crossover catheter and Bentson wire.  Ultimately the catheter was advanced down to the proximal SFA over which we exchanged for a straight 5 French catheter.  We then obtained selective tibial shots and foot shots of the left leg with pertinent findings noted above.  I thought the posterior tibial artery was most amendable to antegrade intervention and  as a result used a Rosen wire to exchange for a long 6 [place] Ansell sheath in the right groin.  The patient was given 7000 units of IV heparin.  I then used a CXI  catheter and a V 18 and ultimately cross the SFA and popliteal artery down to the tibials.  Initially selected the posterior tibial artery to try antegrade recanalization but I could not get my wire and catheter to track but only about a third of the distance through the artery before going out collaterals around chronic total occlusions.  I then tried selecting the anterior tibial artery and similar could not get across in antegrade fashion with multiple attempts.  Given that I think this patient is facing limb loss I did elect to stick the foot in retrograde fashion.  I initially evaluated the posterior tibial artery with ultrasound and it was patent and an image was saved.  I tried a retrograde access in the posterior tibial but the artery was heavily calcified, small, and mobile and ultimately could not cannulate successfully.  I then evaluated the anterior tibial artery and successfully cannulated that with a micropuncture needle under ultrasound guidance.  An image was saved and the artery did appear patent here.  I then placed a micro wire and exchanged for a short 4 [place] Cook retrograde sheath.  I had pulsatile backbleeding.  I then used a V 18 and CXI catheter in retrograde fashion tried to make my wire meet with a wire antegrade down the anterior tibial after pushing on the wire and catheter both with an antegrade and retrograde approach.  I could not make both ends meet and I was concerned that I had extravasation outside the artery given a brief hand-injection in the catheter.  At that point in time I elected to stop after very aggressive attempt to recanalize the anterior tibial.  At that point in time the wires and catheters and sheaths were removed from the anterior tibial retrograde approach and pressure was held on the foot.  I then exchanged for a short 6 French sheath in the right groin and then completed runoff images of the right lower extremity given the flow was much more sluggish on the  right leg.  Pertinent findings are noted above.  Then deployed a mynx closure device in the right groin.  Patient be taken the PACU in stable condition.  Condition: Stable    [name], MD [facility] Office: [phone] Pager: [place]

## 2018-04-28 ENCOUNTER — Inpatient Hospital Stay (HOSPITAL_COMMUNITY): Payer: Medicare (Managed Care) | Admitting: Anesthesiology

## 2018-04-28 ENCOUNTER — Encounter (HOSPITAL_COMMUNITY): Payer: Self-pay | Admitting: Vascular Surgery

## 2018-04-28 ENCOUNTER — Encounter (HOSPITAL_COMMUNITY)
Admission: RE | Disposition: A | Discharge status: Home / Self Care | Payer: Self-pay | Source: Home / Self Care | Attending: Vascular Surgery

## 2018-04-28 ENCOUNTER — Inpatient Hospital Stay (HOSPITAL_COMMUNITY)
Admission: RE | Admit: 2018-04-28 | Discharge: 2018-04-29 | DRG: 239 | Disposition: A | Discharge status: Home / Self Care | Payer: Medicare (Managed Care) | Attending: Vascular Surgery | Admitting: Vascular Surgery

## 2018-04-28 DIAGNOSIS — D631 Anemia in chronic kidney disease: Secondary | ICD-10-CM | POA: Diagnosis present

## 2018-04-28 DIAGNOSIS — N2581 Secondary hyperparathyroidism of renal origin: Secondary | ICD-10-CM | POA: Diagnosis present

## 2018-04-28 DIAGNOSIS — L97519 Non-pressure chronic ulcer of other part of right foot with unspecified severity: Secondary | ICD-10-CM | POA: Diagnosis present

## 2018-04-28 DIAGNOSIS — Z8719 Personal history of other diseases of the digestive system: Secondary | ICD-10-CM | POA: Diagnosis not present

## 2018-04-28 DIAGNOSIS — Z800 Family history of malignant neoplasm of digestive organs: Secondary | ICD-10-CM

## 2018-04-28 DIAGNOSIS — I132 Hypertensive heart and chronic kidney disease with heart failure and with stage 5 chronic kidney disease, or end stage renal disease: Secondary | ICD-10-CM | POA: Diagnosis present

## 2018-04-28 DIAGNOSIS — Z853 Personal history of malignant neoplasm of breast: Secondary | ICD-10-CM | POA: Diagnosis not present

## 2018-04-28 DIAGNOSIS — Z791 Long term (current) use of non-steroidal anti-inflammatories (NSAID): Secondary | ICD-10-CM

## 2018-04-28 DIAGNOSIS — N186 End stage renal disease: Secondary | ICD-10-CM | POA: Diagnosis present

## 2018-04-28 DIAGNOSIS — Z803 Family history of malignant neoplasm of breast: Secondary | ICD-10-CM

## 2018-04-28 DIAGNOSIS — Z8249 Family history of ischemic heart disease and other diseases of the circulatory system: Secondary | ICD-10-CM

## 2018-04-28 DIAGNOSIS — G4730 Sleep apnea, unspecified: Secondary | ICD-10-CM | POA: Diagnosis present

## 2018-04-28 DIAGNOSIS — E11621 Type 2 diabetes mellitus with foot ulcer: Secondary | ICD-10-CM | POA: Diagnosis present

## 2018-04-28 DIAGNOSIS — I480 Paroxysmal atrial fibrillation: Secondary | ICD-10-CM | POA: Diagnosis present

## 2018-04-28 DIAGNOSIS — E1122 Type 2 diabetes mellitus with diabetic chronic kidney disease: Secondary | ICD-10-CM | POA: Diagnosis present

## 2018-04-28 DIAGNOSIS — Z9049 Acquired absence of other specified parts of digestive tract: Secondary | ICD-10-CM | POA: Diagnosis not present

## 2018-04-28 DIAGNOSIS — K219 Gastro-esophageal reflux disease without esophagitis: Secondary | ICD-10-CM | POA: Diagnosis present

## 2018-04-28 DIAGNOSIS — E8889 Other specified metabolic disorders: Secondary | ICD-10-CM | POA: Diagnosis present

## 2018-04-28 DIAGNOSIS — I739 Peripheral vascular disease, unspecified: Secondary | ICD-10-CM

## 2018-04-28 DIAGNOSIS — Z992 Dependence on renal dialysis: Secondary | ICD-10-CM

## 2018-04-28 DIAGNOSIS — Z923 Personal history of irradiation: Secondary | ICD-10-CM

## 2018-04-28 DIAGNOSIS — Z833 Family history of diabetes mellitus: Secondary | ICD-10-CM | POA: Diagnosis not present

## 2018-04-28 DIAGNOSIS — I998 Other disorder of circulatory system: Secondary | ICD-10-CM | POA: Diagnosis present

## 2018-04-28 DIAGNOSIS — E1152 Type 2 diabetes mellitus with diabetic peripheral angiopathy with gangrene: Secondary | ICD-10-CM | POA: Diagnosis present

## 2018-04-28 DIAGNOSIS — I2510 Atherosclerotic heart disease of native coronary artery without angina pectoris: Secondary | ICD-10-CM | POA: Diagnosis present

## 2018-04-28 DIAGNOSIS — I509 Heart failure, unspecified: Secondary | ICD-10-CM | POA: Diagnosis present

## 2018-04-28 DIAGNOSIS — Z8673 Personal history of transient ischemic attack (TIA), and cerebral infarction without residual deficits: Secondary | ICD-10-CM | POA: Diagnosis not present

## 2018-04-28 DIAGNOSIS — Z79899 Other long term (current) drug therapy: Secondary | ICD-10-CM

## 2018-04-28 DIAGNOSIS — M1990 Unspecified osteoarthritis, unspecified site: Secondary | ICD-10-CM | POA: Diagnosis present

## 2018-04-28 DIAGNOSIS — E785 Hyperlipidemia, unspecified: Secondary | ICD-10-CM | POA: Diagnosis present

## 2018-04-28 DIAGNOSIS — Z794 Long term (current) use of insulin: Secondary | ICD-10-CM

## 2018-04-28 DIAGNOSIS — Z7982 Long term (current) use of aspirin: Secondary | ICD-10-CM

## 2018-04-28 HISTORY — DX: End stage renal disease: N18.6

## 2018-04-28 HISTORY — PX: AMPUTATION: SHX166

## 2018-04-28 LAB — COMPREHENSIVE METABOLIC PANEL
ALK PHOS: 116 U/L (ref 38–126)
ALT: 19 U/L (ref 0–44)
ALT: 17 U/L (ref 0–44)
AST: 28 U/L (ref 15–41)
AST: 33 U/L (ref 15–41)
Albumin: 2.9 g/dL — ABNORMAL LOW (ref 3.5–5.0)
Albumin: 3 g/dL — ABNORMAL LOW (ref 3.5–5.0)
Alkaline Phosphatase: 109 U/L (ref 38–126)
Anion gap: 13 (ref 5–15)
Anion gap: 15 (ref 5–15)
BILIRUBIN TOTAL: 0.1 mg/dL — AB (ref 0.3–1.2)
BUN: 19 mg/dL (ref 8–23)
BUN: 20 mg/dL (ref 8–23)
CALCIUM: 8.3 mg/dL — AB (ref 8.9–10.3)
CO2: 29 mmol/L (ref 22–32)
CO2: 27 mmol/L (ref 22–32)
Calcium: 8.2 mg/dL — ABNORMAL LOW (ref 8.9–10.3)
Chloride: 93 mmol/L — ABNORMAL LOW (ref 98–111)
Chloride: 94 mmol/L — ABNORMAL LOW (ref 98–111)
Creatinine, Ser: 4.71 mg/dL — ABNORMAL HIGH (ref 0.44–1.00)
Creatinine, Ser: 4.99 mg/dL — ABNORMAL HIGH (ref 0.44–1.00)
GFR calc Af Amer: 9 mL/min — ABNORMAL LOW (ref >60)
GFR calc Af Amer: 9 mL/min — ABNORMAL LOW (ref >60)
GFR calc non Af Amer: 8 mL/min — ABNORMAL LOW (ref >60)
GFR, EST NON AFRICAN AMERICAN: 8 mL/min — AB (ref >60)
GLUCOSE: 248 mg/dL — AB (ref 70–99)
Glucose, Bld: 261 mg/dL — ABNORMAL HIGH (ref 70–99)
Potassium: 3.5 mmol/L (ref 3.5–5.1)
Potassium: 3.8 mmol/L (ref 3.5–5.1)
Sodium: 135 mmol/L (ref 135–145)
Sodium: 136 mmol/L (ref 135–145)
Total Bilirubin: 0.4 mg/dL (ref 0.3–1.2)
Total Protein: 6.5 g/dL (ref 6.5–8.1)
Total Protein: 6.9 g/dL (ref 6.5–8.1)

## 2018-04-28 LAB — PROTIME-INR
INR: 0.99
INR: 0.96
Prothrombin Time: 13 seconds (ref 11.4–15.2)
Prothrombin Time: 12.7 seconds (ref 11.4–15.2)

## 2018-04-28 LAB — CBC
HEMATOCRIT: 39.1 % (ref 36.0–46.0)
Hemoglobin: 12 g/dL (ref 12.0–15.0)
MCH: 30.2 pg (ref 26.0–34.0)
MCHC: 30.7 g/dL (ref 30.0–36.0)
MCV: 98.5 fL (ref 80.0–100.0)
Platelets: 177 10*3/uL (ref 150–400)
RBC: 3.97 MIL/uL (ref 3.87–5.11)
RDW: 16.5 % — ABNORMAL HIGH (ref 11.5–15.5)
WBC: 8.1 10*3/uL (ref 4.0–10.5)
nRBC: 0 % (ref 0.0–0.2)

## 2018-04-28 LAB — GLUCOSE, CAPILLARY
GLUCOSE-CAPILLARY: 213 mg/dL — AB (ref 70–99)
Glucose-Capillary: 231 mg/dL — ABNORMAL HIGH (ref 70–99)
Glucose-Capillary: 245 mg/dL — ABNORMAL HIGH (ref 70–99)
Glucose-Capillary: 201 mg/dL — ABNORMAL HIGH (ref 70–99)

## 2018-04-28 SURGERY — AMPUTATION, FOOT, RAY
Anesthesia: General | Laterality: Left

## 2018-04-28 MED ORDER — CLONIDINE HCL 0.1 MG PO TABS
0.1000 mg | ORAL_TABLET | ORAL | Status: DC
  Administered 2018-04-28 (×2): 0.1 mg via ORAL
  Filled 2018-04-28 (×2): qty 1

## 2018-04-28 MED ORDER — LABETALOL HCL 5 MG/ML IV SOLN
10.0000 mg | INTRAVENOUS | Status: DC | PRN
  Administered 2018-04-28: 10 mg via INTRAVENOUS

## 2018-04-28 MED ORDER — LIDOCAINE HCL (PF) 1 % IJ SOLN
INTRAMUSCULAR | Status: AC
  Filled 2018-04-28: qty 30

## 2018-04-28 MED ORDER — CHLORHEXIDINE GLUCONATE CLOTH 2 % EX PADS: 6.0000 | MEDICATED_PAD | Freq: Once | CUTANEOUS | Status: DC

## 2018-04-28 MED ORDER — HYDRALAZINE HCL 20 MG/ML IJ SOLN: 5.0000 mg | INTRAMUSCULAR | Status: DC | PRN

## 2018-04-28 MED ORDER — ACETAMINOPHEN 500 MG PO TABS
1000.0000 mg | ORAL_TABLET | Freq: Three times a day (TID) | ORAL | Status: DC | PRN
  Administered 2018-04-28 – 2018-04-29 (×3): 1000 mg via ORAL
  Filled 2018-04-28 (×3): qty 2

## 2018-04-28 MED ORDER — 0.9 % SODIUM CHLORIDE (POUR BTL) OPTIME
TOPICAL | Status: DC | PRN
  Administered 2018-04-28: 1000 mL

## 2018-04-28 MED ORDER — SODIUM CHLORIDE 0.9 % IV SOLN
INTRAVENOUS | Status: DC | PRN
  Administered 2018-04-28: 30 ug/min via INTRAVENOUS

## 2018-04-28 MED ORDER — METOPROLOL TARTRATE 5 MG/5ML IV SOLN: 2.0000 mg | INTRAVENOUS | Status: DC | PRN

## 2018-04-28 MED ORDER — PROPOFOL 10 MG/ML IV BOLUS
INTRAVENOUS | Status: DC | PRN
  Administered 2018-04-28 (×2): 50 mg via INTRAVENOUS

## 2018-04-28 MED ORDER — ISOSORBIDE MONONITRATE ER 30 MG PO TB24
30.0000 mg | ORAL_TABLET | Freq: Every evening | ORAL | Status: DC
  Administered 2018-04-28: 30 mg via ORAL
  Filled 2018-04-28 (×2): qty 1

## 2018-04-28 MED ORDER — DEXAMETHASONE SODIUM PHOSPHATE 10 MG/ML IJ SOLN
INTRAMUSCULAR | Status: AC
  Filled 2018-04-28: qty 1

## 2018-04-28 MED ORDER — SODIUM CHLORIDE 0.9 % IV SOLN
INTRAVENOUS | Status: DC | PRN
  Administered 2018-04-28: 08:00:00 via INTRAVENOUS

## 2018-04-28 MED ORDER — INSULIN ASPART 100 UNIT/ML ~~LOC~~ SOLN
4.0000 [IU] | Freq: Once | SUBCUTANEOUS | Status: AC
  Administered 2018-04-28: 4 [IU] via SUBCUTANEOUS

## 2018-04-28 MED ORDER — ASPIRIN EC 325 MG PO TBEC
325.0000 mg | DELAYED_RELEASE_TABLET | Freq: Every evening | ORAL | Status: DC
  Administered 2018-04-28: 325 mg via ORAL
  Filled 2018-04-28 (×2): qty 1

## 2018-04-28 MED ORDER — PROPOFOL 10 MG/ML IV BOLUS
INTRAVENOUS | Status: AC
  Filled 2018-04-28: qty 20

## 2018-04-28 MED ORDER — METOPROLOL SUCCINATE ER 25 MG PO TB24
12.5000 mg | ORAL_TABLET | Freq: Every evening | ORAL | Status: DC
  Administered 2018-04-28: 12.5 mg via ORAL
  Filled 2018-04-28 (×2): qty 1

## 2018-04-28 MED ORDER — INSULIN ASPART PROT & ASPART (70-30 MIX) 100 UNIT/ML ~~LOC~~ SUSP
20.0000 [IU] | Freq: Every day | SUBCUTANEOUS | Status: DC
  Administered 2018-04-29: 20 [IU] via SUBCUTANEOUS
  Filled 2018-04-28: qty 10

## 2018-04-28 MED ORDER — ONDANSETRON HCL 4 MG/2ML IJ SOLN: 4.0000 mg | Freq: Four times a day (QID) | INTRAMUSCULAR | Status: DC | PRN

## 2018-04-28 MED ORDER — ONDANSETRON HCL 4 MG/2ML IJ SOLN
INTRAMUSCULAR | Status: AC
  Filled 2018-04-28: qty 2

## 2018-04-28 MED ORDER — ALUM & MAG HYDROXIDE-SIMETH 200-200-20 MG/5ML PO SUSP: 15.0000 mL | ORAL | Status: DC | PRN

## 2018-04-28 MED ORDER — RENA-VITE PO TABS
1.0000 | ORAL_TABLET | Freq: Every day | ORAL | Status: DC
  Administered 2018-04-28: 1 via ORAL
  Filled 2018-04-28: qty 1

## 2018-04-28 MED ORDER — ONDANSETRON HCL 4 MG/2ML IJ SOLN
INTRAMUSCULAR | Status: DC | PRN
  Administered 2018-04-28: 4 mg via INTRAVENOUS

## 2018-04-28 MED ORDER — PRAVASTATIN SODIUM 40 MG PO TABS
80.0000 mg | ORAL_TABLET | Freq: Every day | ORAL | Status: DC
  Administered 2018-04-28: 80 mg via ORAL
  Filled 2018-04-28: qty 2

## 2018-04-28 MED ORDER — FENTANYL CITRATE (PF) 250 MCG/5ML IJ SOLN
INTRAMUSCULAR | Status: AC
  Filled 2018-04-28: qty 5

## 2018-04-28 MED ORDER — POTASSIUM CHLORIDE CRYS ER 20 MEQ PO TBCR
20.0000 meq | EXTENDED_RELEASE_TABLET | Freq: Once | ORAL | Status: AC
  Administered 2018-04-28: 20 meq via ORAL
  Filled 2018-04-28: qty 1

## 2018-04-28 MED ORDER — ONDANSETRON HCL 4 MG/2ML IJ SOLN: 4.0000 mg | Freq: Once | INTRAMUSCULAR | Status: DC | PRN

## 2018-04-28 MED ORDER — DONEPEZIL HCL 5 MG PO TABS
5.0000 mg | ORAL_TABLET | Freq: Every day | ORAL | Status: DC
  Administered 2018-04-28: 5 mg via ORAL
  Filled 2018-04-28: qty 1

## 2018-04-28 MED ORDER — FENTANYL CITRATE (PF) 100 MCG/2ML IJ SOLN: 25.0000 ug | INTRAMUSCULAR | Status: DC | PRN

## 2018-04-28 MED ORDER — DEXAMETHASONE SODIUM PHOSPHATE 10 MG/ML IJ SOLN
INTRAMUSCULAR | Status: DC | PRN
  Administered 2018-04-28: 4 mg via INTRAVENOUS

## 2018-04-28 MED ORDER — CITALOPRAM HYDROBROMIDE 20 MG PO TABS
20.0000 mg | ORAL_TABLET | Freq: Every evening | ORAL | Status: DC
  Administered 2018-04-28: 20 mg via ORAL
  Filled 2018-04-28 (×2): qty 1

## 2018-04-28 MED ORDER — HEPARIN SODIUM (PORCINE) 5000 UNIT/ML IJ SOLN
5000.0000 [IU] | Freq: Three times a day (TID) | INTRAMUSCULAR | Status: DC
  Administered 2018-04-28 – 2018-04-29 (×3): 5000 [IU] via SUBCUTANEOUS
  Filled 2018-04-28 (×3): qty 1

## 2018-04-28 MED ORDER — LIDOCAINE HCL (PF) 1 % IJ SOLN
INTRAMUSCULAR | Status: DC | PRN
  Administered 2018-04-28: 15 mL

## 2018-04-28 MED ORDER — LABETALOL HCL 5 MG/ML IV SOLN
INTRAVENOUS | Status: AC
  Filled 2018-04-28: qty 4

## 2018-04-28 MED ORDER — LIDOCAINE 2% (20 MG/ML) 5 ML SYRINGE
INTRAMUSCULAR | Status: DC | PRN
  Administered 2018-04-28: 80 mg via INTRAVENOUS

## 2018-04-28 MED ORDER — INSULIN ASPART PROT & ASPART (70-30 MIX) 100 UNIT/ML ~~LOC~~ SUSP
14.0000 [IU] | Freq: Every day | SUBCUTANEOUS | Status: DC
  Administered 2018-04-28: 14 [IU] via SUBCUTANEOUS
  Filled 2018-04-28: qty 10

## 2018-04-28 MED ORDER — PHENOL 1.4 % MT LIQD: 1.0000 | OROMUCOSAL | Status: DC | PRN

## 2018-04-28 MED ORDER — PANTOPRAZOLE SODIUM 20 MG PO TBEC: 20.0000 mg | DELAYED_RELEASE_TABLET | Freq: Every evening | ORAL | Status: DC

## 2018-04-28 MED ORDER — CEFAZOLIN SODIUM-DEXTROSE 1-4 GM/50ML-% IV SOLN
1.0000 g | Freq: Once | INTRAVENOUS | Status: DC
  Filled 2018-04-28: qty 50

## 2018-04-28 MED ORDER — FAMOTIDINE 10 MG PO TABS
10.0000 mg | ORAL_TABLET | Freq: Every day | ORAL | Status: DC | PRN
  Administered 2018-04-28 – 2018-04-29 (×2): 10 mg via ORAL
  Filled 2018-04-28 (×5): qty 1

## 2018-04-28 MED ORDER — PANTOPRAZOLE SODIUM 40 MG PO TBEC
40.0000 mg | DELAYED_RELEASE_TABLET | Freq: Every day | ORAL | Status: DC
  Administered 2018-04-28 – 2018-04-29 (×2): 40 mg via ORAL
  Filled 2018-04-28: qty 1

## 2018-04-28 MED ORDER — INSULIN LISPRO PROT & LISPRO (50-50 MIX) 100 UNIT/ML KWIKPEN: 14.0000 [IU] | PEN_INJECTOR | SUBCUTANEOUS | Status: DC

## 2018-04-28 MED ORDER — GUAIFENESIN-DM 100-10 MG/5ML PO SYRP: 15.0000 mL | ORAL_SOLUTION | ORAL | Status: DC | PRN

## 2018-04-28 MED ORDER — LIDOCAINE 2% (20 MG/ML) 5 ML SYRINGE
INTRAMUSCULAR | Status: AC
  Filled 2018-04-28: qty 5

## 2018-04-28 MED ORDER — CEFAZOLIN SODIUM-DEXTROSE 2-4 GM/100ML-% IV SOLN
2.0000 g | INTRAVENOUS | Status: AC
  Administered 2018-04-28: 2 g via INTRAVENOUS
  Filled 2018-04-28: qty 100

## 2018-04-28 SURGICAL SUPPLY — 34 items
BANDAGE ACE 4X5 VEL STRL LF (GAUZE/BANDAGES/DRESSINGS) ×1 IMPLANT
BNDG GAUZE ELAST 4 BULKY (GAUZE/BANDAGES/DRESSINGS) ×3 IMPLANT
CANISTER SUCT 3000ML PPV (MISCELLANEOUS) ×3 IMPLANT
COVER SURGICAL LIGHT HANDLE (MISCELLANEOUS) ×3 IMPLANT
COVER WAND RF STERILE (DRAPES) ×1 IMPLANT
DRAPE EXTREMITY T 121X128X90 (DISPOSABLE) ×3 IMPLANT
DRAPE HALF SHEET 40X57 (DRAPES) ×3 IMPLANT
DRSG EMULSION OIL 3X3 NADH (GAUZE/BANDAGES/DRESSINGS) ×3 IMPLANT
ELECT REM PT RETURN 9FT ADLT (ELECTROSURGICAL) ×3
ELECTRODE REM PT RTRN 9FT ADLT (ELECTROSURGICAL) ×1 IMPLANT
GAUZE SPONGE 4X4 12PLY STRL (GAUZE/BANDAGES/DRESSINGS) ×1 IMPLANT
GAUZE SPONGE 4X4 12PLY STRL LF (GAUZE/BANDAGES/DRESSINGS) ×2 IMPLANT
GLOVE BIO SURGEON STRL SZ 6.5 (GLOVE) ×1 IMPLANT
GLOVE BIO SURGEON STRL SZ7.5 (GLOVE) ×5 IMPLANT
GLOVE BIO SURGEONS STRL SZ 6.5 (GLOVE) ×1
GLOVE BIOGEL PI IND STRL 6.5 (GLOVE) IMPLANT
GLOVE BIOGEL PI IND STRL 8 (GLOVE) ×1 IMPLANT
GLOVE BIOGEL PI INDICATOR 6.5 (GLOVE) ×2
GLOVE BIOGEL PI INDICATOR 8 (GLOVE) ×4
GOWN STRL REUS W/ TWL LRG LVL3 (GOWN DISPOSABLE) ×2 IMPLANT
GOWN STRL REUS W/ TWL XL LVL3 (GOWN DISPOSABLE) ×2 IMPLANT
GOWN STRL REUS W/TWL LRG LVL3 (GOWN DISPOSABLE) ×3
GOWN STRL REUS W/TWL XL LVL3 (GOWN DISPOSABLE) ×6
KIT BASIN OR (CUSTOM PROCEDURE TRAY) ×3 IMPLANT
KIT TURNOVER KIT B (KITS) ×3 IMPLANT
NS IRRIG 1000ML POUR BTL (IV SOLUTION) ×3 IMPLANT
PACK GENERAL/GYN (CUSTOM PROCEDURE TRAY) ×3 IMPLANT
PAD ARMBOARD 7.5X6 YLW CONV (MISCELLANEOUS) ×6 IMPLANT
SUT ETHILON 3 0 PS 1 (SUTURE) ×3 IMPLANT
SUT VIC AB 3-0 SH 27 (SUTURE)
SUT VIC AB 3-0 SH 27X BRD (SUTURE) IMPLANT
TOWEL GREEN STERILE (TOWEL DISPOSABLE) ×4 IMPLANT
UNDERPAD 30X30 (UNDERPADS AND DIAPERS) ×3 IMPLANT
WATER STERILE IRR 1000ML POUR (IV SOLUTION) ×1 IMPLANT

## 2018-04-28 NOTE — Op Note (Signed)
Date: April 28, 2018  Preoperative diagnosis: Necrotic left great toe in the setting of critical limb ischemia  Postoperative diagnosis: Same  Procedure: Ray amputation of left great toe  Surgeon: Dr. [name], MD  Assistant: OR staff  Indication: Patient is an 85 year old female who recently presented to clinic last week with a necrotic left great toe as well as a small ulcer on her right great toe.  She underwent arteriogram yesterday that showed severe tibial disease with an occluded anterior tibial and posterior tibial artery and single-vessel runoff via the peroneal.  We attempted both antegrade and retrograde recanalization of her anterior and posterior tibial artery unsuccessfully.  She presents today for toe amputation given that her toe has a boggy appearance and concern for risk of superimposed infection with worsening tissue loss.  Family understands high risk toe amputation with risk for failure to heal.  Findings: Ray amputation of left great toe.  Metatarsal head bone cultures sent for culture and Gram stain.  Fairly robust bleeding from the tissue bed.  Anesthesia: LMA  Details: The patient was taken to the operating room after informed consent was obtained.  She was placed on operative table in the supine position.  Her left leg was prepped and draped in usual sterile fashion.  Initially made a tennis racquet incision at the base of the left great toe creating anterior posterior flaps.  Ultimately using a 15 blade scalpel this dissection was carried down to the metatarsal head and the toe was transected and passed off the field.  I then used the rongeurs and took cultures from the metatarsal head that were then sent to pathology for Gram stain and culture.  There was fairly robust bleeding from the tissue bed.  I then used the rongeurs to smooth out the head of the metatarsal.  Ultimately the wound was copiously irrigated with saline until effluent was clear.  I then  closed the tissue loosely with 4-0 nylon sutures in interrupted fashion.  Saline gauze with dry dressing was applied and there was good oozing from the wound bed.  She was taken the PACU in stable condition.  Condition: Stable  [name], MD [facility] Office: [phone] Pager: [place]

## 2018-04-28 NOTE — Transfer of Care (Signed)
Immediate Anesthesia Transfer of Care Note  Patient: Glenda [name]  Procedure(s) Performed: AMPUTATION RAY LEFT (Left )  Patient Location: PACU  Anesthesia Type:General  Level of Consciousness: drowsy  Airway & Oxygen Therapy: Patient Spontanous Breathing  Post-op Assessment: Report given to RN and Post -op Vital signs reviewed and stable  Post vital signs: Reviewed and stable  Last Vitals:  Vitals Value Taken Time  BP 142/38 04/28/2018  8:38 AM  Temp 97.7   Pulse 59 04/28/2018  8:40 AM  Resp 10 04/28/2018  8:40 AM  SpO2 99 % 04/28/2018  8:40 AM  Vitals shown include unvalidated device data.  Last Pain:  Vitals:   04/28/18 0633  TempSrc:   PainSc: 8          Complications: No apparent anesthesia complications

## 2018-04-28 NOTE — H&P (Signed)
History and Physical Interval Note:  04/28/2018 7:23 AM  Glenda [name]  has presented today for surgery, with the diagnosis of NONVIABLE TISSUE LEFT GREAT TOE  The various methods of treatment have been discussed with the patient and family. After consideration of risks, benefits and other options for treatment, the patient has consented to  Procedure(s): AMPUTATION LEFT GREAT TOE (Left) as a surgical intervention .  The patient's history has been reviewed, patient examined, no change in status, stable for surgery.  I have reviewed the patient's chart and labs.  Questions were answered to the patient's satisfaction.    Plan for left great toe amp today - necrotic toe that is progressing.  Arteriogram yesterday - small vessel disease and AT/PT occluded.  Family understands high risk for failure to heal and may ultimately require higher level amputation.   [name]  REASON FOR CONSULT:BLE tissue loss  HPI: Glenda [name] a 85 y.o.female,with history of end-stage renal disease on hemodialysis, coronary artery disease, CHF, hypertension, diabetes, peripheral vascular disease who presents for evaluation of bilateral lower extremity tissue loss. The patient's left great toe wound issignificantly worse than her right great toe. Family states this has been ongoing for the last 6 months. They noticed that it hasgotten worse in the last couple weeks and she saw her primary care doctor who moved up her appointment to today here with [facility]. She denies any significant rest pain.Patient is in a wheelchair today but states she does walk with a walker. She denies any previous lower extremity interventions. No previous bypasses orarteriograms. She has a left upper extremity fistula and dialyzes on Monday Wednesday Friday.      Past Medical History:  Diagnosis Date  . Arthritis   . Breast cancer ([place])   . CAD (coronary artery disease)   . Chest pain   . CHF (congestive  heart failure) ([place])   . Chronic kidney disease    stage 4  . Dizziness   . DM (diabetes mellitus) ([place])   . GERD (gastroesophageal reflux disease)   . HLD (hyperlipidemia)   . HTN (hypertension)   . Obesity   . PAF (paroxysmal atrial fibrillation) ([place])   . Radiation 09/11/15-10/13/15   42.72 Gy to right breast, 12 Gy boost to right breast  . Shortness of breath dyspnea   . Sleep apnea    no longer uses cpap   . Stroke [facility])    balance issues - walks with walker and uses wheelchair   . UTI (urinary tract infection)          Past Surgical History:  Procedure Laterality Date  . AV FISTULA PLACEMENT Left 07/14/2017   Procedure: Left arm Brachicephalic Fistula Creation; Surgeon: Serafina Mitchell, MD; Location: [place]; Service: Vascular; Laterality: Left;  . BALLOON DILATION N/A 08/18/2017   Procedure: BALLOON DILATION; Surgeon: Clarene Essex, MD; Location: [place]; Service: Endoscopy; Laterality: N/A;  . BASCILIC VEIN TRANSPOSITION Left 10/12/2017   Procedure: SECOND STAGE BASILIC VEIN TRANSPOSITION LEFT ARM; Surgeon: Serafina Mitchell, MD; Location: [place]; Service: Vascular; Laterality: Left;  . CHOLECYSTECTOMY    . colon polyps; hx    . CORONARY ANGIOPLASTY  02/14/09  . ESOPHAGOGASTRODUODENOSCOPY (EGD) WITH PROPOFOL Left 09/08/2015   Procedure: ESOPHAGOGASTRODUODENOSCOPY (EGD) WITH PROPOFOL; Surgeon: Clarene Essex, MD; Location: [facility] ENDOSCOPY; Service: Endoscopy; Laterality: Left;  . ESOPHAGOGASTRODUODENOSCOPY (EGD) WITH PROPOFOL N/A 08/18/2017   Procedure: ESOPHAGOGASTRODUODENOSCOPY (EGD) WITH PROPOFOL; Surgeon: Clarene Essex, MD; Location: [place]; Service: Endoscopy; Laterality: N/A; with flouroscopy  .  IR FLUORO GUIDE CV LINE RIGHT  08/25/2017  . IR FLUORO GUIDE CV LINE RIGHT  08/29/2017  . IR US GUIDE VASC ACCESS RIGHT  08/25/2017  . RADIOACTIVE SEED GUIDED PARTIAL MASTECTOMY WITH AXILLARY SENTINEL LYMPH NODE BIOPSY Right 07/17/2015    Procedure: RADIOACTIVE SEED GUIDED PARTIAL MASTECTOMY WITH AXILLARY SENTINEL LYMPH NODE BIOPSY; Surgeon: Erroll Luna, MD; Location: [place]; Service: General; Laterality: Right;         Family History  Problem Relation Age of Onset  . Diabetes Mother   . Colon cancer Father    also had lung  . Diabetes Other   . Heart attack Other   . Diabetes Sister    [name]  . Breast cancer Sister     SOCIAL HISTORY: Social History        Socioeconomic History  . Marital status: Widowed    Spouse name: Not on file  . Number of children: 5  . Years of education: 49  . Highest education level: Not on file  Occupational History  . Occupation: Retired  Scientific laboratory technician  . Financial resource strain: Not on file  . Food insecurity:    Worry: Not on file    Inability: Not on file  . Transportation needs:    Medical: Not on file    Non-medical: Not on file  Tobacco Use  . Smoking status: Never Smoker  . Smokeless tobacco: Never Used  Substance and Sexual Activity  . Alcohol use: No    Alcohol/week: 0.0 standard drinks  . Drug use: No  . Sexual activity: Not Currently  Lifestyle  . Physical activity:    Days per week: Not on file    Minutes per session: Not on file  . Stress: Not on file  Relationships  . Social connections:    Talks on phone: Not on file    Gets together: Not on file    Attends religious service: Not on file    Active member of club or organization: Not on file    Attends meetings of clubs or organizations: Not on file    Relationship status: Not on file  . Intimate partner violence:    Fear of current or ex partner: Not on file    Emotionally abused: Not on file    Physically abused: Not on file    Forced sexual activity: Not on file  Other Topics Concern  . Not on file  Social History Narrative   Retired.    Lives with daughter   Caffeine use: Drinks soda and coffee and tea daily     No Known Allergies        Current Outpatient Medications  Medication Sig Dispense Refill  . aspirin EC 81 MG tablet Take 81 mg by mouth every evening.     . citalopram (CELEXA) 20 MG tablet Take 20 mg by mouth every evening.     . cloNIDine (CATAPRES) 0.1 MG tablet Take 0.1 mg by mouth 2 (two) times daily.    Marland Kitchen GLUCERNA (GLUCERNA) LIQD Take 237 mLs by mouth daily as needed (for poor intake).    Marland Kitchen HUMALOG MIX 50/50 KWIKPEN (50-50) 100 UNIT/ML Kwikpen Inject 18-20 Units into the skin 2 (two) times daily. Inject 20 units in the morning and 18 units at bedtime; hold for blood sugar readings lower than 200  11  . isosorbide mononitrate (IMDUR) 30 MG 24 hr tablet Take 1 tablet (30 mg total) daily by mouth. (Patient taking differently: Take 30 mg by  mouth every evening. ) 30 tablet 0  . metoprolol succinate (TOPROL-XL) 25 MG 24 hr tablet Take 1 tablet (25 mg total) by mouth daily. (Patient taking differently: Take 25 mg by mouth every evening. ) 30 tablet 0  . pantoprazole (PROTONIX) 40 MG tablet Take 40 mg by mouth every evening.     . pravastatin (PRAVACHOL) 80 MG tablet Take 80 mg by mouth at bedtime.     . simethicone (GAS-X) 80 MG chewable tablet Chew 80 mg by mouth every 6 (six) hours as needed for flatulence.    . cetirizine (ZYRTEC) 10 MG tablet Take 10 mg by mouth every evening.     . famotidine (PEPCID AC) 10 MG chewable tablet Chew 10 mg by mouth daily as needed for heartburn.    . warfarin (COUMADIN) 5 MG tablet Take 1.5 tablets (7.5 mg total) by mouth one time only at 6 PM. (Patient not taking: Reported on 04/21/2018) 30 tablet 0   No current facility-administered medications for this visit.    REVIEW OF SYSTEMS: [X]  denotes positive finding, [ ]  denotes negative finding Cardiac  Comments:  Chest pain or chest pressure:    Shortness of breath upon exertion:    Short of breath when lying flat:    Irregular heart rhythm:         Vascular    Pain in calf, thigh, or hip brought on by ambulation:    Pain in feet at night that wakes you up from your sleep:     Blood clot in your veins:    Leg swelling:         Pulmonary    Oxygen at home:    Productive cough:     Wheezing:         Neurologic    Sudden weakness in arms or legs:     Sudden numbness in arms or legs:     Sudden onset of difficulty speaking or slurred speech:    Temporary loss of vision in one eye:     Problems with dizziness:         Gastrointestinal    Blood in stool:     Vomited blood:         Genitourinary    Burning when urinating:     Blood in urine:        Psychiatric    Major depression:         Hematologic    Bleeding problems:    Problems with blood clotting too easily:        Skin    Rashes or ulcers:        Constitutional    Fever or chills:      PHYSICAL EXAM:    Vitals:   04/21/18 0830  BP: (!) 119/57  Pulse: 61  Resp: 20  Temp: (!) 97.3 F (36.3 C)  SpO2: 100%  Weight: 145 lb (65.8 kg)  Height: 5\' 6"  (1.676 m)    GENERAL: The patient is a well-nourishedfemale, in no acute distress. The vital signs are documented above. CARDIAC:There is a regular rate and rhythm.  VASCULAR: 2+ right radial pulse, no left radial pulse palpable, left upper arm basilic vein fistula with good thrill 2+ right femoral pulse, 1+ left femoral pulse Biphasic R DP signal Biphasic L DP/PT signal Dry gangrene of left great toe Ulcer on tip of right great toe PULMONARY: There is good air exchange bilaterally without wheezing or rales. ABDOMEN:Soft and non-tender with normal pitched  bowel sounds.  MUSCULOSKELETAL:There are no major deformities or cyanosis. NEUROLOGIC:No focal weakness or paresthesias are detected. SKIN:There are no ulcers or rashes noted. PSYCHIATRIC:The patient has a normal  affect.  DATA:  None  Assessment/Plan:  85 year old female with end-stage renal disease on hemodialysis and diabetes that presents with critical limb ischemia of her bilateral lower extremities with tissue loss. She has dry gangrene of her left great toe with another smaller ulcer on her right great toe tip - all progressing over past 6 months. Discussed with her family that this is a limb threatening situation. I think we will start with an arteriogram of her bilateral lower extremities next week at next available date. Given that her left great toe is significantly worse,plan is tostick her right common femoral artery shoot the runoff of both legs andtry and intervene on the left leg first. Discussed possible endovascular intervention but also she may havesmall vessel disease and there is nothing that is amendable to intervention. Confirmedwith patient that she is not currently on Coumadin. Will get baseline ABI's today.   [name], MD [facility] Office: [phone] Pager: [phone]

## 2018-04-28 NOTE — Anesthesia Procedure Notes (Signed)
Procedure Name: LMA Insertion °Performed by: Amayrani Bennick H, CRNA °Pre-anesthesia Checklist: Patient identified, Emergency Drugs available, Suction available and Patient being monitored °Patient Re-evaluated:Patient Re-evaluated prior to induction °Oxygen Delivery Method: Circle System Utilized °Preoxygenation: Pre-oxygenation with 100% oxygen °Induction Type: IV induction °Ventilation: Mask ventilation without difficulty °LMA: LMA inserted °LMA Size: 4.0 °Number of attempts: 1 °Airway Equipment and Method: Bite block °Placement Confirmation: positive ETCO2 °Tube secured with: Tape °Dental Injury: Teeth and Oropharynx as per pre-operative assessment  ° ° ° ° ° ° °

## 2018-04-28 NOTE — Consult Note (Signed)
[facility] Renal Consultation Note    Indication for Consultation:  Management of ESRD/hemodialysis; anemia, hypertension/volume and secondary hyperparathyroidism  HPI: Glenda [name] is a 85 y.o. female with ESRD on HD TTS at [facility].  Last dialysis 04/27/18.   Admitted with left great toe gangrene. Had LLE arteriogram yesterday with severe tibial disease. Revascularization attempt unsuccessful. Underwent Ray amputation of L great toe today per Dr. Carlis Abbott.   Due for routine dialysis tomorrow. Labs stable Na 136, K 3.8, Hgb 12.0, WBC 8.1  Seen and examined at bedside. Resting comfortably, pain controlled. Feels like she has some indigestion. Denies CP, SOB, N/V.   Past Medical History:  Diagnosis Date  . Arthritis    patient denies  . Breast cancer ([place])    Breast Cancer right   treated with radiation  . CAD (coronary artery disease)   . Chest pain   . CHF (congestive heart failure) ([place])   . Dizziness   . DM (diabetes mellitus) ([place])    TYpe II  . ESRD (end stage renal disease) ([place])    TTHSAT- [address]  . GERD (gastroesophageal reflux disease)   . HLD (hyperlipidemia)   . HTN (hypertension)   . Obesity   . PAF (paroxysmal atrial fibrillation) ([place])   . Radiation 09/11/15-10/13/15   42.72 Gy to right breast, 12 Gy boost to right breast  . Shortness of breath dyspnea    04/27/2018- not so much since she began dialysis  . Sleep apnea    no longer uses cpap   . Stroke [facility])    balance issues - walks with walker and uses wheelchair  Had 3 in 2017- last 1 was in October  . UTI (urinary tract infection)    Past Surgical History:  Procedure Laterality Date  . ABDOMINAL AORTOGRAM W/LOWER EXTREMITY N/A 04/27/2018   Procedure: ABDOMINAL AORTOGRAM W/LOWER EXTREMITY;  Surgeon: [name], MD;  Location: [place] CV LAB;  Service: Cardiovascular;  Laterality: N/A;  . AV FISTULA PLACEMENT Left 07/14/2017   Procedure: Left arm Brachicephalic  Fistula Creation;  Surgeon: Serafina Mitchell, MD;  [facility] OR;  Service: Vascular;  Laterality: Left;  . BALLOON DILATION N/A 08/18/2017   Procedure: BALLOON DILATION;  Surgeon: Clarene Essex, MD;  Location: [place];  Service: Endoscopy;  Laterality: N/A;  . BASCILIC VEIN TRANSPOSITION Left 10/12/2017   Procedure: SECOND STAGE BASILIC VEIN TRANSPOSITION LEFT ARM;  Surgeon: Serafina Mitchell, MD;  Location: [place];  Service: Vascular;  Laterality: Left;  . CHOLECYSTECTOMY    . colon polyps; hx    . CORONARY ANGIOPLASTY  02/14/09  . ESOPHAGOGASTRODUODENOSCOPY (EGD) WITH PROPOFOL Left 09/08/2015   Procedure: ESOPHAGOGASTRODUODENOSCOPY (EGD) WITH PROPOFOL;  Surgeon: Clarene Essex, MD;  Location: [facility] ENDOSCOPY;  Service: Endoscopy;  Laterality: Left;  . ESOPHAGOGASTRODUODENOSCOPY (EGD) WITH PROPOFOL N/A 08/18/2017   Procedure: ESOPHAGOGASTRODUODENOSCOPY (EGD) WITH PROPOFOL;  Surgeon: Clarene Essex, MD;  Location: [place];  Service: Endoscopy;  Laterality: N/A;  with flouroscopy  . IR FLUORO GUIDE CV LINE RIGHT  08/25/2017  . IR FLUORO GUIDE CV LINE RIGHT  08/29/2017  . IR US GUIDE VASC ACCESS RIGHT  08/25/2017  . RADIOACTIVE SEED GUIDED PARTIAL MASTECTOMY WITH AXILLARY SENTINEL LYMPH NODE BIOPSY Right 07/17/2015   Procedure: RADIOACTIVE SEED GUIDED PARTIAL MASTECTOMY WITH AXILLARY SENTINEL LYMPH NODE BIOPSY;  Surgeon: Erroll Luna, MD;  Location: [place];  Service: General;  Laterality: Right;   Family History  Problem Relation Age of Onset  . Diabetes  Mother   . Colon cancer Father        also had lung  . Diabetes Other   . Heart attack Other   . Diabetes Sister        [name]  . Breast cancer Sister    Social History:  reports that she has never smoked. She has never used smokeless tobacco. She reports that she does not drink alcohol or use drugs. No Known Allergies Prior to Admission medications   Medication Sig Start Date End Date Taking? Authorizing Provider  acetaminophen (TYLENOL) 650 MG  CR tablet Take 1,300 mg by mouth every 8 (eight) hours as needed for pain.   Yes [provider]  aspirin EC 325 MG tablet Take 325 mg by mouth every evening.    Yes [provider]  carbamide peroxide (DEBROX) 6.5 % OTIC solution Place 2 drops into both ears daily as needed (for ear wax).   Yes [provider]  Cholecalciferol (VITAMIN D3) 25 MCG (1000 UT) CAPS Take 1,000 Units by mouth every evening.   Yes [provider]  citalopram (CELEXA) 20 MG tablet Take 20 mg by mouth every evening.    Yes [provider]  cloNIDine (CATAPRES) 0.1 MG tablet Take 0.1 mg by mouth See admin instructions. Take 0.1 mg by mouth in the evening and 0.1 mg by mouth at bedtime   Yes [provider]  donepezil (ARICEPT) 5 MG tablet Take 5 mg by mouth at bedtime.   Yes [provider]  famotidine (PEPCID AC) 10 MG chewable tablet Chew 10 mg by mouth daily as needed for heartburn.   Yes [provider]  HUMALOG MIX 50/50 KWIKPEN (50-50) 100 UNIT/ML Kwikpen Inject 14-20 Units into the skin See admin instructions. Inject 20 units in the morning and 14 units at evening; hold for blood sugar readings lower than 200 01/06/17  Yes [provider]  isosorbide mononitrate (IMDUR) 30 MG 24 hr tablet Take 1 tablet (30 mg total) daily by mouth. Patient taking differently: Take 30 mg by mouth every evening.  03/06/17 10/08/18 Yes Thurnell Lose, MD  metoprolol succinate (TOPROL-XL) 25 MG 24 hr tablet Take 1 tablet (25 mg total) by mouth daily. Patient taking differently: Take 12.5 mg by mouth every evening.  09/01/17  Yes Ghimire, Henreitta Leber, MD  pantoprazole (PROTONIX) 20 MG tablet Take 20 mg by mouth every evening.    Yes [provider]  pravastatin (PRAVACHOL) 80 MG tablet Take 80 mg by mouth at bedtime.    Yes [provider]  b complex-vitamin c-folic acid (NEPHRO-VITE) 0.8 MG TABS tablet Take 1 tablet by mouth every evening.     [provider]  warfarin (COUMADIN) 5 MG tablet Take 1.5 tablets (7.5 mg total) by mouth one time only at 6 PM. Patient not taking: Reported on 04/21/2018 09/01/17   Jonetta Osgood, MD   Current Facility-Administered Medications  Medication Dose Route Frequency Provider Last Rate Last Dose  . acetaminophen (TYLENOL) tablet 1,000 mg  1,000 mg Oral Q8H PRN [name], MD      . alum & mag hydroxide-simeth (MAALOX/MYLANTA) 200-200-20 MG/5ML suspension 15-30 mL  15-30 mL Oral Q2H PRN [name], MD      . aspirin EC tablet 325 mg  325 mg Oral QPM [name], MD      . Derrill Memo ON 04/29/2018] ceFAZolin (ANCEF) IVPB 1 g/50 mL premix  1 g Intravenous Once [name], MD      .  citalopram (CELEXA) tablet 20 mg  20 mg Oral QPM [name], MD      . cloNIDine (CATAPRES) tablet 0.1 mg  0.1 mg Oral 2 times per day [name], MD      . donepezil (ARICEPT) tablet 5 mg  5 mg Oral QHS [name], MD      . famotidine (PEPCID) tablet 10 mg  10 mg Oral Daily PRN [name], MD   10 mg at 04/28/18 1551  . guaiFENesin-dextromethorphan (ROBITUSSIN DM) 100-10 MG/5ML syrup 15 mL  15 mL Oral Q4H PRN [name], MD      . heparin injection 5,000 Units  5,000 Units Subcutaneous Q8H [name], MD   5,000 Units at 04/28/18 1515  . hydrALAZINE (APRESOLINE) injection 5 mg  5 mg Intravenous Q20 Min PRN [name], MD      . insulin aspart protamine- aspart (NOVOLOG MIX 70/30) injection 14 Units  14 Units Subcutaneous Q supper [name], MD      . Derrill Memo ON 04/29/2018] insulin aspart protamine- aspart (NOVOLOG MIX 70/30) injection 20 Units  20 Units Subcutaneous Q breakfast [name], MD      . isosorbide mononitrate (IMDUR) 24 hr tablet 30 mg  30 mg Oral QPM [name], MD      . labetalol (NORMODYNE,TRANDATE) injection 10 mg  10 mg Intravenous Q10 min PRN [name],  MD   10 mg at 04/28/18 1257  . metoprolol succinate (TOPROL-XL) 24 hr tablet 12.5 mg  12.5 mg Oral QPM [name], MD      . metoprolol tartrate (LOPRESSOR) injection 2-5 mg  2-5 mg Intravenous Q2H PRN [name], MD      . ondansetron [facility]) injection 4 mg  4 mg Intravenous Q6H PRN [name], MD      . pantoprazole (PROTONIX) EC tablet 40 mg  40 mg Oral Daily [name], MD   40 mg at 04/28/18 1515  . phenol (CHLORASEPTIC) mouth spray 1 spray  1 spray Mouth/Throat PRN [name], MD      . potassium chloride SA (K-DUR,KLOR-CON) CR tablet 20-40 mEq  20-40 mEq Oral Once [name], MD      . pravastatin (PRAVACHOL) tablet 80 mg  80 mg Oral QHS [name], MD        ROS: As per HPI otherwise negative.  Physical Exam: Vitals:   04/28/18 1208 04/28/18 1238 04/28/18 1307 04/28/18 1346  BP: (!) 165/55 (!) 180/61 (!) 140/52 (!) 144/54  Pulse: 66 74 67 73  Resp: 14 (!) 22 18 19   Temp:   97.7 F (36.5 C)   TempSrc:      SpO2: 99% 98% 98% 99%  Weight:      Height:         General: WDWN elderly female NAD  Head: NCAT sclera not icteric MMM Neck: Supple. No JVD  Lungs: CTA bilaterally without wheezes, rales, or rhonchi. Breathing is unlabored. Heart: RRR with S1 S2 Abdomen: soft NT  Lower extremities: No LE edema. L foot bandaged  Neuro: A & O  X 3. Moves all extremities spontaneously. Psych:  Responds to questions appropriately with a normal affect. Dialysis Access: LUE AVF +bruit   Labs: Basic Metabolic Panel: Recent Labs  Lab 04/27/18 0628 04/28/18 0641 04/28/18 1447  NA 137 135 136  K 3.3* 3.5 3.8  CL 99 93* 94*  CO2  --  29 27  GLUCOSE 105* 261* 248*  BUN 38* 19 20  CREATININE 5.30* 4.71* 4.99*  CALCIUM  --  8.2* 8.3*   Liver Function Tests: Recent Labs  Lab 04/28/18 0641 04/28/18 1447  AST 28 33  ALT 19 17  ALKPHOS 109 116  BILITOT 0.1* 0.4  PROT 6.5 6.9  ALBUMIN 2.9* 3.0*   No results  for input(s): LIPASE, AMYLASE in the last 168 hours. No results for input(s): AMMONIA in the last 168 hours. CBC: Recent Labs  Lab 04/27/18 0628 04/28/18 0641  WBC  --  8.1  HGB 13.6 12.0  HCT 40.0 39.1  MCV  --  98.5  PLT  --  177   Cardiac Enzymes: No results for input(s): CKTOTAL, CKMB, CKMBINDEX, TROPONINI in the last 168 hours. CBG: Recent Labs  Lab 04/27/18 1037 04/28/18 0613 04/28/18 0842 04/28/18 1016 04/28/18 1301  GLUCAP 125* 213* 231* 245* 201*   Iron Studies: No results for input(s): IRON, TIBC, TRANSFERRIN, FERRITIN in the last 72 hours. Studies/Results: No results found.  Dialysis Orders:  [facility] TTS 4.25h BFR 400/800 EDW 63kg 2K/2Ca L AVF Hep 6000 U Venofer 100mg  IV x 7 (6/7 dosed) Calcitriol 0.25 mcg PO TIW   Assessment/Plan: 1.  L great toe gangrene s/p ray amp 04/28/17 per [facility]  2.  ESRD -  HD TTS. Next HD tomorrow on schedule. Use 4K bath.  3.  Hypertension/volume  - BP slightly elevated. Continue home meds. UF to EDW as tolerated  4.  Anemia  - Hgb 12.0. No ESA needs  5.  Metabolic bone disease -  Ca ok. Continue VDRA. Not on binder as outpatient  Lynnda Child PA-C [place] Pager [phone] 04/28/2018, 4:29 PM

## 2018-04-29 LAB — RENAL FUNCTION PANEL
Albumin: 2.5 g/dL — ABNORMAL LOW (ref 3.5–5.0)
Anion gap: 12 (ref 5–15)
BUN: 32 mg/dL — ABNORMAL HIGH (ref 8–23)
CO2: 26 mmol/L (ref 22–32)
Calcium: 8 mg/dL — ABNORMAL LOW (ref 8.9–10.3)
Chloride: 99 mmol/L (ref 98–111)
Creatinine, Ser: 6.26 mg/dL — ABNORMAL HIGH (ref 0.44–1.00)
GFR calc Af Amer: 7 mL/min — ABNORMAL LOW (ref >60)
GFR, EST NON AFRICAN AMERICAN: 6 mL/min — AB (ref >60)
Glucose, Bld: 176 mg/dL — ABNORMAL HIGH (ref 70–99)
Phosphorus: 5.4 mg/dL — ABNORMAL HIGH (ref 2.5–4.6)
Potassium: 3.3 mmol/L — ABNORMAL LOW (ref 3.5–5.1)
Sodium: 137 mmol/L (ref 135–145)

## 2018-04-29 LAB — CBC
HCT: 31.7 % — ABNORMAL LOW (ref 36.0–46.0)
Hemoglobin: 9.9 g/dL — ABNORMAL LOW (ref 12.0–15.0)
MCH: 29.7 pg (ref 26.0–34.0)
MCHC: 31.2 g/dL (ref 30.0–36.0)
MCV: 95.2 fL (ref 80.0–100.0)
Platelets: 142 10*3/uL — ABNORMAL LOW (ref 150–400)
RBC: 3.33 MIL/uL — ABNORMAL LOW (ref 3.87–5.11)
RDW: 16.4 % — AB (ref 11.5–15.5)
WBC: 8.1 10*3/uL (ref 4.0–10.5)
nRBC: 0 % (ref 0.0–0.2)

## 2018-04-29 LAB — GLUCOSE, CAPILLARY: Glucose-Capillary: 136 mg/dL — ABNORMAL HIGH (ref 70–99)

## 2018-04-29 MED ORDER — OXYCODONE-ACETAMINOPHEN 5-325 MG PO TABS: 1.0000 | ORAL_TABLET | Freq: Four times a day (QID) | ORAL | 0 refills | Status: DC | PRN

## 2018-04-29 MED ORDER — SODIUM CHLORIDE 0.9 % IV SOLN: 100.0000 mL | INTRAVENOUS | Status: DC | PRN

## 2018-04-29 MED ORDER — HEPARIN SODIUM (PORCINE) 1000 UNIT/ML DIALYSIS
20.0000 [IU]/kg | INTRAMUSCULAR | Status: DC | PRN
  Filled 2018-04-29: qty 2

## 2018-04-29 MED ORDER — PENTAFLUOROPROP-TETRAFLUOROETH EX AERO: 1.0000 "application " | INHALATION_SPRAY | CUTANEOUS | Status: DC | PRN

## 2018-04-29 NOTE — Progress Notes (Signed)
Pt has been educated on discharge instructions and IV has been removed. Now wheeling pt out to front doors to go home.

## 2018-04-29 NOTE — Progress Notes (Signed)
Orthopedic Tech Progress Note Patient Details:  Glenda [name] 03/12/1934 [id number] APPLIED WITH bENNY Ortho Devices Type of Ortho Device: Darco shoe Ortho Device/Splint Location: LLE Ortho Device/Splint Interventions: Adjustment, Application, Ordered   Post Interventions Patient Tolerated: Well Instructions Provided: Care of device, Adjustment of device   [name] 04/29/2018, 6:25 PM

## 2018-04-29 NOTE — Progress Notes (Signed)
Admit: 04/28/2018 LOS: 1  47F ESRD s/p toe amputaton for gangrenous changes  Subjective:  . No overnight events . For HD today . Likely DC afterwards   01/03 0701 - 01/04 0700 In: 75 [P.O.:240; I.V.:250] Out: 100 [Blood:100]  Filed Weights   04/28/18 0607  Weight: 66.7 kg    Scheduled Meds: . aspirin EC  325 mg Oral QPM  . citalopram  20 mg Oral QPM  . cloNIDine  0.1 mg Oral 2 times per day  . donepezil  5 mg Oral QHS  . heparin  5,000 Units Subcutaneous Q8H  . insulin aspart protamine- aspart  14 Units Subcutaneous Q supper  . insulin aspart protamine- aspart  20 Units Subcutaneous Q breakfast  . isosorbide mononitrate  30 mg Oral QPM  . metoprolol succinate  12.5 mg Oral QPM  . multivitamin  1 tablet Oral QHS  . pantoprazole  40 mg Oral Daily  . pravastatin  80 mg Oral QHS   Continuous Infusions: .  ceFAZolin (ANCEF) IV 100 mL/hr at 04/29/18 0649   PRN Meds:.acetaminophen, alum & mag hydroxide-simeth, famotidine, guaiFENesin-dextromethorphan, hydrALAZINE, labetalol, metoprolol tartrate, ondansetron, phenol  Current Labs: reviewed    Physical Exam:  Blood pressure (!) 127/49, pulse 86, temperature 98.2 F (36.8 C), temperature source Oral, resp. rate 13, height 5\' 6"  (1.676 m), weight 66.7 kg, SpO2 96 %. General: WDWN elderly female NAD  Head: NCAT sclera not icteric Neck: Supple. No JVD  Lungs: CTA bilaterally without wheezes, rales, or rhonchi. Breathing is unlabored. Heart: RRR with S1 S2 Abdomen: soft NT  Lower extremities: No LE edema. L foot bandaged  Neuro: A & O  X 3. Moves all extremities spontaneously. Psych:  Responds to questions appropriately with a normal affect. Dialysis Access: LUE AVF +bruit   Dialysis Orders:  [facility] TTS 4.25h BFR 400/800 EDW 63kg 2K/2Ca L AVF Hep 6000 U Venofer 100mg  IV x 7 (6/7 dosed) Calcitriol 0.25 mcg PO TIW   A 1. L 1st toe amputation for gangrene 04/28/17 2. ESRD THS  3. HTN/Vol 4. Anemia, stable 5. CKDBMD,  stable  P . HD today, can dc afterwards   Pearson Grippe MD 04/29/2018, 10:41 AM  Recent Labs  Lab 04/27/18 0628 04/28/18 0641 04/28/18 1447  NA 137 135 136  K 3.3* 3.5 3.8  CL 99 93* 94*  CO2  --  29 27  GLUCOSE 105* 261* 248*  BUN 38* 19 20  CREATININE 5.30* 4.71* 4.99*  CALCIUM  --  8.2* 8.3*   Recent Labs  Lab 04/27/18 0628 04/28/18 0641  WBC  --  8.1  HGB 13.6 12.0  HCT 40.0 39.1  MCV  --  98.5  PLT  --  177

## 2018-04-29 NOTE — Progress Notes (Signed)
[facility]   Subjective  - feels ok minimal pain   Objective (!) 127/49 86 98.2 F (36.8 C) (Oral) 13 96%  Intake/Output Summary (Last 24 hours) at 04/29/2018 1015 Last data filed at 04/28/2018 1355 Gross per 24 hour  Intake 240 ml  Output -  Net 240 ml   Toe amp site clean no hematoma    Assessment/Planning: S/p toe amp looks good D/c home today post HD Needs Darco shoe prior to d/c  [facility] 04/29/2018 10:15 AM --  Laboratory Lab Results: Recent Labs    04/27/18 0628 04/28/18 0641  WBC  --  8.1  HGB 13.6 12.0  HCT 40.0 39.1  PLT  --  177   BMET Recent Labs    04/28/18 0641 04/28/18 1447  NA 135 136  K 3.5 3.8  CL 93* 94*  CO2 29 27  GLUCOSE 261* 248*  BUN 19 20  CREATININE 4.71* 4.99*  CALCIUM 8.2* 8.3*    COAG Lab Results  Component Value Date   INR 0.96 04/28/2018   INR 0.99 04/28/2018   INR 1.00 10/12/2017   No results found for: PTT

## 2018-04-30 LAB — HEPATITIS B SURFACE ANTIGEN: Hepatitis B Surface Ag: NEGATIVE

## 2018-05-01 ENCOUNTER — Telehealth: Payer: Self-pay | Admitting: Vascular Surgery

## 2018-05-01 ENCOUNTER — Encounter (HOSPITAL_COMMUNITY): Payer: Self-pay | Admitting: Vascular Surgery

## 2018-05-01 NOTE — Anesthesia Postprocedure Evaluation (Signed)
Anesthesia Post Note  Patient: Glenda [name]  Procedure(s) Performed: AMPUTATION RAY LEFT (Left )     Patient location during evaluation: PACU Anesthesia Type: General Level of consciousness: awake and alert Pain management: pain level controlled Vital Signs Assessment: post-procedure vital signs reviewed and stable Respiratory status: spontaneous breathing, nonlabored ventilation, respiratory function stable and patient connected to nasal cannula oxygen Cardiovascular status: blood pressure returned to baseline and stable Postop Assessment: no apparent nausea or vomiting Anesthetic complications: no    Last Vitals:  Vitals:   04/29/18 1700 04/29/18 1715  BP: (!) 149/68 (!) 162/68  Pulse: 71 75  Resp: 20 14  Temp:  (!) 36.1 C  SpO2:  98%    Last Pain:  Vitals:   04/29/18 1715  TempSrc: Oral  PainSc: 0-No pain                 Catalina Gravel

## 2018-05-01 NOTE — Telephone Encounter (Signed)
sch appt spk to pt and daughter mld ltr 05/16/2018 1115am p/o MD

## 2018-05-01 NOTE — Telephone Encounter (Signed)
-----   Message from [name], MD sent at 05/01/2018  7:34 AM EST ----- Can we make sure Ms. Koury has a follow-up with me for post-op check of toe in 2 weeks or so?  Thanks,  Gerald Stabs

## 2018-05-02 NOTE — Discharge Summary (Signed)
[facility] Discharge Summary   Patient ID:  Glenda [name] MRN: [id number] DOB/AGE: 07/30/1933 85 y.o.  Admit date: 04/28/2018 Discharge date: 04/29/2018 Date of Surgery: 04/28/2018 Surgeon: Surgeon(s): [name], MD  Admission Diagnosis: NONVIABLE TISSUE LEFT GREAT TOE  Discharge Diagnoses:  NONVIABLE TISSUE LEFT GREAT TOE  Secondary Diagnoses: Past Medical History:  Diagnosis Date  . Arthritis    patient denies  . Breast cancer ([place])    Breast Cancer right   treated with radiation  . CAD (coronary artery disease)   . Chest pain   . CHF (congestive heart failure) ([place])   . Dizziness   . DM (diabetes mellitus) ([place])    TYpe II  . ESRD (end stage renal disease) ([place])    TTHSAT- [address]  . GERD (gastroesophageal reflux disease)   . HLD (hyperlipidemia)   . HTN (hypertension)   . Obesity   . PAF (paroxysmal atrial fibrillation) ([place])   . Radiation 09/11/15-10/13/15   42.72 Gy to right breast, 12 Gy boost to right breast  . Shortness of breath dyspnea    04/27/2018- not so much since she began dialysis  . Sleep apnea    no longer uses cpap   . Stroke [facility])    balance issues - walks with walker and uses wheelchair  Had 3 in 2017- last 1 was in October  . UTI (urinary tract infection)     Procedure(s): AMPUTATION RAY LEFT  Discharged Condition: stable  HPI:  85 year old female with end-stage renal disease on hemodialysis and diabetes that presents with critical limb ischemia of her bilateral lower extremities with tissue loss. She has dry gangrene of her left great toe with another smaller ulcer on her right great toe tip - all progressing over past 6 months  Hospital Course:  Glenda [name] is a 85 y.o. female is S/P  Procedure(s): AMPUTATION RAY LEFT Incision site healing well she was discharged in stable condition with a darco shoe for heel weight bearing.  She will f/u in the [facility] clinic 2-3 weeks for incisional check.  Consults:   Treatment Team:  Rexene Agent, MD for HD needs  Significant Diagnostic Studies: CBC Lab Results  Component Value Date   WBC 8.1 04/29/2018   HGB 9.9 (L) 04/29/2018   HCT 31.7 (L) 04/29/2018   MCV 95.2 04/29/2018   PLT 142 (L) 04/29/2018    BMET    Component Value Date/Time   NA 137 04/29/2018 1246   NA 140 09/16/2015   NA 141 05/07/2015 1251   K 3.3 (L) 04/29/2018 1246   K 4.3 05/07/2015 1251   CL 99 04/29/2018 1246   CO2 26 04/29/2018 1246   CO2 22 05/07/2015 1251   GLUCOSE 176 (H) 04/29/2018 1246   GLUCOSE 110 05/07/2015 1251   BUN 32 (H) 04/29/2018 1246   BUN 34 (A) 09/16/2015   BUN 22.5 05/07/2015 1251   CREATININE 6.26 (H) 04/29/2018 1246   CREATININE 1.2 (H) 05/07/2015 1251   CALCIUM 8.0 (L) 04/29/2018 1246   CALCIUM 9.1 05/07/2015 1251   GFRNONAA 6 (L) 04/29/2018 1246   GFRAA 7 (L) 04/29/2018 1246   COAG Lab Results  Component Value Date   INR 0.96 04/28/2018   INR 0.99 04/28/2018   INR 1.00 10/12/2017     Disposition:  Discharge to :Home Discharge Instructions    Activity as tolerated - No restrictions   Complete by:  As directed    Use darco shoe for toe amp  side   Call MD for:  redness, tenderness, or signs of infection (pain, swelling, bleeding, redness, odor or green/yellow discharge around incision site)   Complete by:  As directed    Call MD for:  severe or increased pain, loss or decreased feeling  in affected limb(s)   Complete by:  As directed    Call MD for:  temperature >100.5   Complete by:  As directed    Change dressing (specify)   Complete by:  As directed    Dressing change: Once daily see above.   Discharge instructions   Complete by:  As directed    Change dressing once daily with dry gauze kerlix wrap  Ok to wash with soap and water once daily do not soak wound   Driving Restrictions   Complete by:  As directed    No driving for 2 weeks   Lifting restrictions   Complete by:  As directed    No lifting for 2 weeks    May shower    Complete by:  As directed    Resume previous diet   Complete by:  As directed    may wash over wound with mild soap and water   Complete by:  As directed      Allergies as of 04/29/2018   No Known Allergies     Medication List    TAKE these medications   acetaminophen 650 MG CR tablet Commonly known as:  TYLENOL Take 1,300 mg by mouth every 8 (eight) hours as needed for pain.   aspirin EC 325 MG tablet Take 325 mg by mouth every evening.   b complex-vitamin c-folic acid 0.8 MG Tabs tablet Take 1 tablet by mouth every evening.   carbamide peroxide 6.5 % OTIC solution Commonly known as:  DEBROX Place 2 drops into both ears daily as needed (for ear wax).   citalopram 20 MG tablet Commonly known as:  CELEXA Take 20 mg by mouth every evening.   cloNIDine 0.1 MG tablet Commonly known as:  CATAPRES Take 0.1 mg by mouth See admin instructions. Take 0.1 mg by mouth in the evening and 0.1 mg by mouth at bedtime   donepezil 5 MG tablet Commonly known as:  ARICEPT Take 5 mg by mouth at bedtime.   famotidine 10 MG chewable tablet Commonly known as:  PEPCID AC Chew 10 mg by mouth daily as needed for heartburn.   HUMALOG MIX 50/50 KWIKPEN (50-50) 100 UNIT/ML Kwikpen Generic drug:  Insulin Lispro Prot & Lispro Inject 14-20 Units into the skin See admin instructions. Inject 20 units in the morning and 14 units at evening; hold for blood sugar readings lower than 200   isosorbide mononitrate 30 MG 24 hr tablet Commonly known as:  IMDUR Take 1 tablet (30 mg total) daily by mouth. What changed:  when to take this   metoprolol succinate 25 MG 24 hr tablet Commonly known as:  TOPROL-XL Take 1 tablet (25 mg total) by mouth daily. What changed:    how much to take  when to take this   oxyCODONE-acetaminophen 5-325 MG tablet Commonly known as:  PERCOCET/ROXICET Take 1-2 tablets by mouth every 6 (six) hours as needed for severe pain.   pantoprazole 20 MG  tablet Commonly known as:  PROTONIX Take 20 mg by mouth every evening.   pravastatin 80 MG tablet Commonly known as:  PRAVACHOL Take 80 mg by mouth at bedtime.   Vitamin D3 25 MCG (1000 UT) Caps Take  1,000 Units by mouth every evening.   warfarin 5 MG tablet Commonly known as:  COUMADIN Take 1.5 tablets (7.5 mg total) by mouth one time only at 6 PM.            Discharge Care Instructions  (From admission, onward)         Start     Ordered   04/29/18 0000  Change dressing (specify)    Comments:  Dressing change: Once daily see above.   04/29/18 1011         Verbal and written Discharge instructions given to the patient. Wound care per Discharge AVS   Signed: Roxy Horseman 05/02/2018, 8:53 AM

## 2018-05-03 LAB — AEROBIC/ANAEROBIC CULTURE W GRAM STAIN (SURGICAL/DEEP WOUND)

## 2018-05-03 LAB — AEROBIC/ANAEROBIC CULTURE (SURGICAL/DEEP WOUND)

## 2018-05-16 ENCOUNTER — Ambulatory Visit (INDEPENDENT_AMBULATORY_CARE_PROVIDER_SITE_OTHER): Payer: Medicare (Managed Care) | Admitting: Vascular Surgery

## 2018-05-16 ENCOUNTER — Other Ambulatory Visit: Payer: Self-pay | Admitting: *Deleted

## 2018-05-16 ENCOUNTER — Encounter: Payer: Self-pay | Admitting: Vascular Surgery

## 2018-05-16 ENCOUNTER — Encounter: Payer: Medicare Other | Admitting: Vascular Surgery

## 2018-05-16 ENCOUNTER — Encounter: Payer: Self-pay | Admitting: *Deleted

## 2018-05-16 ENCOUNTER — Other Ambulatory Visit: Payer: Self-pay

## 2018-05-16 VITALS — BP 149/63 | HR 62 | Temp 97.5°F | Resp 18 | Ht 66.0 in | Wt 142.0 lb

## 2018-05-16 DIAGNOSIS — I739 Peripheral vascular disease, unspecified: Secondary | ICD-10-CM | POA: Diagnosis not present

## 2018-05-16 NOTE — Progress Notes (Signed)
Patient name: Glenda [name] MRN: [id number] DOB: 06-19-1933 Sex: female  REASON FOR VISIT: Follow-up after bilateral lower extremity angiogram and left great toe amp in setting of bilateral lower extremity tissue loss  HPI: Glenda [name] is a 85 y.o. female that presents for follow-up status post aortogram/bilateral lower extremity arteriogram for bilateral lower extremity tissue loss and left great toe ray amp on 04/28/2018.  Patient reports no new issues in the interim including no fevers or chills at home.  We previously accessed her right groin with planned to intervene on the left lower extremity where she had single-vessel peroneal runoff.  She also has tissue loss on the right great toe and has single-vessel runoff via the anterior tibial artery that is severely diseased.  Dialysis days are Tues, Thurs, Sat.  Past Medical History:  Diagnosis Date  . Arthritis    patient denies  . Breast cancer ([place])    Breast Cancer right   treated with radiation  . CAD (coronary artery disease)   . Chest pain   . CHF (congestive heart failure) ([place])   . Dizziness   . DM (diabetes mellitus) ([place])    TYpe II  . ESRD (end stage renal disease) ([place])    TTHSAT- [facility]  . GERD (gastroesophageal reflux disease)   . HLD (hyperlipidemia)   . HTN (hypertension)   . Obesity   . PAF (paroxysmal atrial fibrillation) ([place])   . Radiation 09/11/15-10/13/15   42.72 Gy to right breast, 12 Gy boost to right breast  . Shortness of breath dyspnea    04/27/2018- not so much since she began dialysis  . Sleep apnea    no longer uses cpap   . Stroke [facility])    balance issues - walks with walker and uses wheelchair  Had 3 in 2017- last 1 was in October  . UTI (urinary tract infection)     Past Surgical History:  Procedure Laterality Date  . ABDOMINAL AORTOGRAM W/LOWER EXTREMITY N/A 04/27/2018   Procedure: ABDOMINAL AORTOGRAM W/LOWER EXTREMITY;  Surgeon: [name], MD;  Location: [place] [facility];   Service: Cardiovascular;  Laterality: N/A;  . AMPUTATION Left 04/28/2018   Procedure: AMPUTATION RAY LEFT;  Surgeon: [name], MD;  Location[facility] OR;  Service: Vascular;  Laterality: Left;  . AV FISTULA PLACEMENT Left 07/14/2017   Procedure: Left arm Brachicephalic Fistula Creation;  Surgeon: Serafina Mitchell, MD;  Location: [place];  Service: Vascular;  Laterality: Left;  . BALLOON DILATION N/A 08/18/2017   Procedure: BALLOON DILATION;  Surgeon: Clarene Essex, MD;  Location: [place];  Service: Endoscopy;  Laterality: N/A;  . BASCILIC VEIN TRANSPOSITION Left 10/12/2017   Procedure: SECOND STAGE BASILIC VEIN TRANSPOSITION LEFT ARM;  Surgeon: Serafina Mitchell, MD;  Location: [place];  Service: Vascular;  Laterality: Left;  . CHOLECYSTECTOMY    . colon polyps; hx    . CORONARY ANGIOPLASTY  02/14/09  . ESOPHAGOGASTRODUODENOSCOPY (EGD) WITH PROPOFOL Left 09/08/2015   Procedure: ESOPHAGOGASTRODUODENOSCOPY (EGD) WITH PROPOFOL;  Surgeon: Clarene Essex, MD;  Location: [facility] ENDOSCOPY;  Service: Endoscopy;  Laterality: Left;  . ESOPHAGOGASTRODUODENOSCOPY (EGD) WITH PROPOFOL N/A 08/18/2017   Procedure: ESOPHAGOGASTRODUODENOSCOPY (EGD) WITH PROPOFOL;  Surgeon: Clarene Essex, MD;  Location: [place];  Service: Endoscopy;  Laterality: N/A;  with flouroscopy  . IR FLUORO GUIDE CV LINE RIGHT  08/25/2017  . IR FLUORO GUIDE CV LINE RIGHT  08/29/2017  . IR US GUIDE VASC ACCESS RIGHT  08/25/2017  . RADIOACTIVE SEED  GUIDED PARTIAL MASTECTOMY WITH AXILLARY SENTINEL LYMPH NODE BIOPSY Right 07/17/2015   Procedure: RADIOACTIVE SEED GUIDED PARTIAL MASTECTOMY WITH AXILLARY SENTINEL LYMPH NODE BIOPSY;  Surgeon: Erroll Luna, MD;  Location: [place];  Service: General;  Laterality: Right;    Family History  Problem Relation Age of Onset  . Diabetes Mother   . Colon cancer Father        also had lung  . Diabetes Other   . Heart attack Other   . Diabetes Sister        [name]  . Breast cancer Sister     SOCIAL  HISTORY: Social History   Tobacco Use  . Smoking status: Never Smoker  . Smokeless tobacco: Never Used  Substance Use Topics  . Alcohol use: No    Alcohol/week: 0.0 standard drinks    No Known Allergies  Current Outpatient Medications  Medication Sig Dispense Refill  . acetaminophen (TYLENOL) 650 MG CR tablet Take 1,300 mg by mouth every 8 (eight) hours as needed for pain.    Marland Kitchen aspirin EC 325 MG tablet Take 325 mg by mouth every evening.     Marland Kitchen b complex-vitamin c-folic acid (NEPHRO-VITE) 0.8 MG TABS tablet Take 1 tablet by mouth every evening.    . carbamide peroxide (DEBROX) 6.5 % OTIC solution Place 2 drops into both ears daily as needed (for ear wax).    . Cholecalciferol (VITAMIN D3) 25 MCG (1000 UT) CAPS Take 1,000 Units by mouth every evening.    . citalopram (CELEXA) 20 MG tablet Take 20 mg by mouth every evening.     . cloNIDine (CATAPRES) 0.1 MG tablet Take 0.1 mg by mouth See admin instructions. Take 0.1 mg by mouth in the evening and 0.1 mg by mouth at bedtime    . donepezil (ARICEPT) 5 MG tablet Take 5 mg by mouth at bedtime.    . famotidine (PEPCID AC) 10 MG chewable tablet Chew 10 mg by mouth daily as needed for heartburn.    Marland Kitchen HUMALOG MIX 50/50 KWIKPEN (50-50) 100 UNIT/ML Kwikpen Inject 14-20 Units into the skin See admin instructions. Inject 20 units in the morning and 14 units at evening; hold for blood sugar readings lower than 200  11  . isosorbide mononitrate (IMDUR) 30 MG 24 hr tablet Take 1 tablet (30 mg total) daily by mouth. (Patient taking differently: Take 30 mg by mouth every evening. ) 30 tablet 0  . metoprolol succinate (TOPROL-XL) 25 MG 24 hr tablet Take 1 tablet (25 mg total) by mouth daily. (Patient taking differently: Take 12.5 mg by mouth every evening. ) 30 tablet 0  . oxyCODONE-acetaminophen (PERCOCET/ROXICET) 5-325 MG tablet Take 1-2 tablets by mouth every 6 (six) hours as needed for severe pain. 20 tablet 0  . pantoprazole (PROTONIX) 20 MG tablet Take  20 mg by mouth every evening.     . pravastatin (PRAVACHOL) 80 MG tablet Take 80 mg by mouth at bedtime.     Marland Kitchen warfarin (COUMADIN) 5 MG tablet Take 1.5 tablets (7.5 mg total) by mouth one time only at 6 PM. 30 tablet 0   No current facility-administered medications for this visit.     REVIEW OF SYSTEMS:  [X]  denotes positive finding, [ ]  denotes negative finding Cardiac  Comments:  Chest pain or chest pressure:    Shortness of breath upon exertion:    Short of breath when lying flat:    Irregular heart rhythm:        Vascular  Pain in calf, thigh, or hip brought on by ambulation:    Pain in feet at night that wakes you up from your sleep:     Blood clot in your veins:    Leg swelling:         Pulmonary    Oxygen at home:    Productive cough:     Wheezing:         Neurologic    Sudden weakness in arms or legs:     Sudden numbness in arms or legs:     Sudden onset of difficulty speaking or slurred speech:    Temporary loss of vision in one eye:     Problems with dizziness:         Gastrointestinal    Blood in stool:     Vomited blood:         Genitourinary    Burning when urinating:     Blood in urine:        Psychiatric    Major depression:         Hematologic    Bleeding problems:    Problems with blood clotting too easily:        Skin    Rashes or ulcers:        Constitutional    Fever or chills:      PHYSICAL EXAM: Vitals:   05/16/18 0830  BP: (!) 149/63  Pulse: 62  Resp: 18  Temp: (!) 97.5 F (36.4 C)  TempSrc: Oral  SpO2: 100%  Weight: 142 lb (64.4 kg)  Height: 5\' 6"  (1.676 m)    GENERAL: The patient is a well-nourished female, in no acute distress. The vital signs are documented above. VASCULAR:  Right groin without hematoma. Ray amp left great toe - no drainage, scabbing over with some signs of healing, some discoloration to dorsum of foot Left great toe ulceration Biphasic R DP signal Biphasic L DP/PT signal PULMONARY: There is good  air exchange bilaterally without wheezing or rales. ABDOMEN: Soft and non-tender with normal pitched bowel sounds.  MUSCULOSKELETAL: There are no major deformities or cyanosis.   DATA:   None  Assessment/Plan:  Discussed with patient and her daughter that the ray amputation of the left great toe is showing some signs of healing but will require very close observation given some discoloration of tissue on the dorsum of the foot.  She has single-vessel peroneal runoff on the left and we attempted recanalization of her anterior posterior tibial artery unsuccessfully.  I think while we continue to monitor the left great toe amputation site I will plan to set her up for right lower extremity intervention given single-vessel anterior tibial runoff that is very diseased with tissue loss in the right great toe as well.  We will plan for right lower extremity arteriogram and intervention next Wednesday which is nondialysis day and I will plan to take the sutures out of her left great toe at that time as that will be around 3 weeks since her amputation.  Discussed that if the ray amp of the left great toe fails to heal and she would require a TMA which would be high risk versus a BKA.  Confirmed she is not on coumadin.  Also getting IV antibiotics through dialysis for 6 weeks for osteo of left metatarsal.   [name], MD [facility] [place] Office: [phone] Pager: [phone]

## 2018-05-19 ENCOUNTER — Encounter (HOSPITAL_COMMUNITY): Payer: Medicare (Managed Care)

## 2018-05-19 ENCOUNTER — Encounter: Payer: Medicare (Managed Care) | Admitting: Vascular Surgery

## 2018-05-24 ENCOUNTER — Inpatient Hospital Stay (HOSPITAL_COMMUNITY)
Admission: AD | Admit: 2018-05-24 | Discharge: 2018-05-30 | DRG: 270 | Disposition: A | Discharge status: Skilled Nursing Facility | Payer: Medicare (Managed Care) | Attending: Vascular Surgery | Admitting: Vascular Surgery

## 2018-05-24 ENCOUNTER — Encounter (HOSPITAL_COMMUNITY)
Admission: AD | Disposition: A | Discharge status: Skilled Nursing Facility | Payer: Self-pay | Source: Home / Self Care | Attending: Vascular Surgery

## 2018-05-24 ENCOUNTER — Other Ambulatory Visit: Payer: Self-pay

## 2018-05-24 DIAGNOSIS — I480 Paroxysmal atrial fibrillation: Secondary | ICD-10-CM | POA: Diagnosis present

## 2018-05-24 DIAGNOSIS — I739 Peripheral vascular disease, unspecified: Secondary | ICD-10-CM | POA: Diagnosis present

## 2018-05-24 DIAGNOSIS — Z89432 Acquired absence of left foot: Secondary | ICD-10-CM

## 2018-05-24 DIAGNOSIS — Z7901 Long term (current) use of anticoagulants: Secondary | ICD-10-CM

## 2018-05-24 DIAGNOSIS — Z9861 Coronary angioplasty status: Secondary | ICD-10-CM

## 2018-05-24 DIAGNOSIS — Z853 Personal history of malignant neoplasm of breast: Secondary | ICD-10-CM | POA: Diagnosis not present

## 2018-05-24 DIAGNOSIS — K219 Gastro-esophageal reflux disease without esophagitis: Secondary | ICD-10-CM | POA: Diagnosis present

## 2018-05-24 DIAGNOSIS — Z794 Long term (current) use of insulin: Secondary | ICD-10-CM

## 2018-05-24 DIAGNOSIS — G4730 Sleep apnea, unspecified: Secondary | ICD-10-CM | POA: Diagnosis present

## 2018-05-24 DIAGNOSIS — I2510 Atherosclerotic heart disease of native coronary artery without angina pectoris: Secondary | ICD-10-CM | POA: Diagnosis present

## 2018-05-24 DIAGNOSIS — E1122 Type 2 diabetes mellitus with diabetic chronic kidney disease: Secondary | ICD-10-CM | POA: Diagnosis present

## 2018-05-24 DIAGNOSIS — E785 Hyperlipidemia, unspecified: Secondary | ICD-10-CM | POA: Diagnosis present

## 2018-05-24 DIAGNOSIS — I132 Hypertensive heart and chronic kidney disease with heart failure and with stage 5 chronic kidney disease, or end stage renal disease: Secondary | ICD-10-CM | POA: Diagnosis present

## 2018-05-24 DIAGNOSIS — D631 Anemia in chronic kidney disease: Secondary | ICD-10-CM | POA: Diagnosis present

## 2018-05-24 DIAGNOSIS — I5032 Chronic diastolic (congestive) heart failure: Secondary | ICD-10-CM | POA: Diagnosis present

## 2018-05-24 DIAGNOSIS — Z79899 Other long term (current) drug therapy: Secondary | ICD-10-CM | POA: Diagnosis not present

## 2018-05-24 DIAGNOSIS — E11649 Type 2 diabetes mellitus with hypoglycemia without coma: Secondary | ICD-10-CM | POA: Diagnosis present

## 2018-05-24 DIAGNOSIS — Z992 Dependence on renal dialysis: Secondary | ICD-10-CM

## 2018-05-24 DIAGNOSIS — T8789 Other complications of amputation stump: Secondary | ICD-10-CM | POA: Diagnosis not present

## 2018-05-24 DIAGNOSIS — E1152 Type 2 diabetes mellitus with diabetic peripheral angiopathy with gangrene: Secondary | ICD-10-CM | POA: Diagnosis present

## 2018-05-24 DIAGNOSIS — M1990 Unspecified osteoarthritis, unspecified site: Secondary | ICD-10-CM | POA: Diagnosis present

## 2018-05-24 DIAGNOSIS — I70261 Atherosclerosis of native arteries of extremities with gangrene, right leg: Secondary | ICD-10-CM

## 2018-05-24 DIAGNOSIS — Z8673 Personal history of transient ischemic attack (TIA), and cerebral infarction without residual deficits: Secondary | ICD-10-CM

## 2018-05-24 DIAGNOSIS — N186 End stage renal disease: Secondary | ICD-10-CM | POA: Diagnosis present

## 2018-05-24 DIAGNOSIS — Z923 Personal history of irradiation: Secondary | ICD-10-CM

## 2018-05-24 DIAGNOSIS — E8889 Other specified metabolic disorders: Secondary | ICD-10-CM | POA: Diagnosis present

## 2018-05-24 DIAGNOSIS — Z833 Family history of diabetes mellitus: Secondary | ICD-10-CM | POA: Diagnosis not present

## 2018-05-24 DIAGNOSIS — Z79891 Long term (current) use of opiate analgesic: Secondary | ICD-10-CM

## 2018-05-24 DIAGNOSIS — N2581 Secondary hyperparathyroidism of renal origin: Secondary | ICD-10-CM | POA: Diagnosis present

## 2018-05-24 HISTORY — PX: LOWER EXTREMITY ANGIOGRAPHY: CATH118251

## 2018-05-24 HISTORY — PX: PERIPHERAL VASCULAR ATHERECTOMY: CATH118256

## 2018-05-24 LAB — CREATININE, SERUM
Creatinine, Ser: 4.88 mg/dL — ABNORMAL HIGH (ref 0.44–1.00)
GFR calc Af Amer: 9 mL/min — ABNORMAL LOW (ref >60)
GFR calc non Af Amer: 8 mL/min — ABNORMAL LOW (ref >60)

## 2018-05-24 LAB — GLUCOSE, CAPILLARY
Glucose-Capillary: 138 mg/dL — ABNORMAL HIGH (ref 70–99)
Glucose-Capillary: 119 mg/dL — ABNORMAL HIGH (ref 70–99)
Glucose-Capillary: 157 mg/dL — ABNORMAL HIGH (ref 70–99)
Glucose-Capillary: 264 mg/dL — ABNORMAL HIGH (ref 70–99)

## 2018-05-24 LAB — POCT I-STAT 4, (NA,K, GLUC, HGB,HCT)
Glucose, Bld: 129 mg/dL — ABNORMAL HIGH (ref 70–99)
HCT: 45 % (ref 36.0–46.0)
Hemoglobin: 15.3 g/dL — ABNORMAL HIGH (ref 12.0–15.0)
Potassium: 3.5 mmol/L (ref 3.5–5.1)
SODIUM: 137 mmol/L (ref 135–145)

## 2018-05-24 LAB — POCT I-STAT CREATININE: CREATININE: 4.9 mg/dL — AB (ref 0.44–1.00)

## 2018-05-24 LAB — CBC
HCT: 40.2 % (ref 36.0–46.0)
Hemoglobin: 12.6 g/dL (ref 12.0–15.0)
MCH: 30.9 pg (ref 26.0–34.0)
MCHC: 31.3 g/dL (ref 30.0–36.0)
MCV: 98.5 fL (ref 80.0–100.0)
Platelets: 156 10*3/uL (ref 150–400)
RBC: 4.08 MIL/uL (ref 3.87–5.11)
RDW: 17.9 % — AB (ref 11.5–15.5)
WBC: 7.3 10*3/uL (ref 4.0–10.5)
nRBC: 0.4 % — ABNORMAL HIGH (ref 0.0–0.2)

## 2018-05-24 SURGERY — LOWER EXTREMITY ANGIOGRAPHY
Anesthesia: LOCAL | Laterality: Right

## 2018-05-24 MED ORDER — ISOSORBIDE MONONITRATE ER 30 MG PO TB24
30.0000 mg | ORAL_TABLET | Freq: Every evening | ORAL | Status: DC
  Administered 2018-05-24 – 2018-05-29 (×5): 30 mg via ORAL
  Filled 2018-05-24 (×6): qty 1

## 2018-05-24 MED ORDER — NITROGLYCERIN IN D5W 200-5 MCG/ML-% IV SOLN
INTRAVENOUS | Status: AC
  Filled 2018-05-24: qty 250

## 2018-05-24 MED ORDER — VERAPAMIL HCL 2.5 MG/ML IV SOLN
INTRAVENOUS | Status: AC
  Filled 2018-05-24: qty 2

## 2018-05-24 MED ORDER — ONDANSETRON HCL 4 MG/2ML IJ SOLN
4.0000 mg | Freq: Four times a day (QID) | INTRAMUSCULAR | Status: DC | PRN
  Administered 2018-05-24 – 2018-05-30 (×7): 4 mg via INTRAVENOUS
  Filled 2018-05-24 (×4): qty 2

## 2018-05-24 MED ORDER — CLONIDINE HCL 0.1 MG PO TABS
0.1000 mg | ORAL_TABLET | Freq: Two times a day (BID) | ORAL | Status: DC
  Administered 2018-05-24 (×2): 0.1 mg via ORAL
  Filled 2018-05-24 (×3): qty 1

## 2018-05-24 MED ORDER — ASPIRIN EC 325 MG PO TBEC
325.0000 mg | DELAYED_RELEASE_TABLET | Freq: Every evening | ORAL | Status: DC
  Administered 2018-05-24 – 2018-05-29 (×5): 325 mg via ORAL
  Filled 2018-05-24 (×6): qty 1

## 2018-05-24 MED ORDER — HYDRALAZINE HCL 20 MG/ML IJ SOLN
INTRAMUSCULAR | Status: DC | PRN
  Administered 2018-05-24: 10 mg via INTRAVENOUS

## 2018-05-24 MED ORDER — HEPARIN (PORCINE) IN NACL 1000-0.9 UT/500ML-% IV SOLN
INTRAVENOUS | Status: AC
  Filled 2018-05-24: qty 1000

## 2018-05-24 MED ORDER — VIPERSLIDE LUBRICANT OPTIME
TOPICAL | Status: DC | PRN
  Administered 2018-05-24: 20 mL via SURGICAL_CAVITY

## 2018-05-24 MED ORDER — LABETALOL HCL 5 MG/ML IV SOLN
10.0000 mg | INTRAVENOUS | Status: DC | PRN
  Administered 2018-05-24: 10 mg via INTRAVENOUS

## 2018-05-24 MED ORDER — CLOPIDOGREL BISULFATE 75 MG PO TABS
75.0000 mg | ORAL_TABLET | Freq: Every day | ORAL | Status: DC
  Administered 2018-05-25 – 2018-05-30 (×6): 75 mg via ORAL
  Filled 2018-05-24 (×6): qty 1

## 2018-05-24 MED ORDER — LIDOCAINE HCL (PF) 1 % IJ SOLN
INTRAMUSCULAR | Status: AC
  Filled 2018-05-24: qty 30

## 2018-05-24 MED ORDER — CLONIDINE HCL 0.1 MG PO TABS: 0.1000 mg | ORAL_TABLET | ORAL | Status: DC

## 2018-05-24 MED ORDER — SODIUM CHLORIDE 0.9 % IV SOLN
250.0000 mL | INTRAVENOUS | Status: DC | PRN
  Administered 2018-05-26 (×2): via INTRAVENOUS

## 2018-05-24 MED ORDER — IODIXANOL 320 MG/ML IV SOLN
INTRAVENOUS | Status: DC | PRN
  Administered 2018-05-24: 75 mL via INTRA_ARTERIAL

## 2018-05-24 MED ORDER — LIDOCAINE HCL (PF) 1 % IJ SOLN
INTRAMUSCULAR | Status: DC | PRN
  Administered 2018-05-24: 15 mL

## 2018-05-24 MED ORDER — DONEPEZIL HCL 5 MG PO TABS
5.0000 mg | ORAL_TABLET | Freq: Every day | ORAL | Status: DC
  Administered 2018-05-24 – 2018-05-29 (×6): 5 mg via ORAL
  Filled 2018-05-24 (×6): qty 1

## 2018-05-24 MED ORDER — HEPARIN SODIUM (PORCINE) 5000 UNIT/ML IJ SOLN
5000.0000 [IU] | Freq: Three times a day (TID) | INTRAMUSCULAR | Status: DC
  Administered 2018-05-24 – 2018-05-30 (×14): 5000 [IU] via SUBCUTANEOUS
  Filled 2018-05-24 (×14): qty 1

## 2018-05-24 MED ORDER — CITALOPRAM HYDROBROMIDE 20 MG PO TABS
20.0000 mg | ORAL_TABLET | Freq: Every evening | ORAL | Status: DC
  Administered 2018-05-24 – 2018-05-29 (×5): 20 mg via ORAL
  Filled 2018-05-24 (×6): qty 1

## 2018-05-24 MED ORDER — HEPARIN (PORCINE) IN NACL 1000-0.9 UT/500ML-% IV SOLN
INTRAVENOUS | Status: DC | PRN
  Administered 2018-05-24 (×2): 500 mL

## 2018-05-24 MED ORDER — ONDANSETRON HCL 4 MG/2ML IJ SOLN
INTRAMUSCULAR | Status: AC
  Filled 2018-05-24: qty 2

## 2018-05-24 MED ORDER — CALCIUM ACETATE (PHOS BINDER) 667 MG PO CAPS
667.0000 mg | ORAL_CAPSULE | Freq: Three times a day (TID) | ORAL | Status: DC
  Administered 2018-05-24 – 2018-05-30 (×13): 667 mg via ORAL
  Filled 2018-05-24 (×12): qty 1

## 2018-05-24 MED ORDER — ACETAMINOPHEN 325 MG PO TABS
650.0000 mg | ORAL_TABLET | ORAL | Status: DC | PRN
  Administered 2018-05-25 – 2018-05-30 (×4): 650 mg via ORAL
  Filled 2018-05-24 (×2): qty 2

## 2018-05-24 MED ORDER — HEPARIN SODIUM (PORCINE) 1000 UNIT/ML IJ SOLN
INTRAMUSCULAR | Status: AC
  Filled 2018-05-24: qty 1

## 2018-05-24 MED ORDER — LABETALOL HCL 5 MG/ML IV SOLN
INTRAVENOUS | Status: AC
  Filled 2018-05-24: qty 4

## 2018-05-24 MED ORDER — HEPARIN SODIUM (PORCINE) 1000 UNIT/ML IJ SOLN
INTRAMUSCULAR | Status: DC | PRN
  Administered 2018-05-24: 7000 [IU] via INTRAVENOUS
  Administered 2018-05-24: 2000 [IU] via INTRAVENOUS

## 2018-05-24 MED ORDER — SODIUM CHLORIDE 0.9% FLUSH: 3.0000 mL | INTRAVENOUS | Status: DC | PRN

## 2018-05-24 MED ORDER — SODIUM CHLORIDE 0.9 % IV SOLN: INTRAVENOUS | Status: DC

## 2018-05-24 MED ORDER — NITROGLYCERIN 1 MG/10 ML FOR IR/CATH LAB
INTRA_ARTERIAL | Status: DC | PRN
  Administered 2018-05-24 (×3): 200 ug via INTRA_ARTERIAL

## 2018-05-24 MED ORDER — METOPROLOL SUCCINATE ER 25 MG PO TB24
12.5000 mg | ORAL_TABLET | Freq: Every evening | ORAL | Status: DC
  Administered 2018-05-24 – 2018-05-29 (×5): 12.5 mg via ORAL
  Filled 2018-05-24 (×7): qty 1

## 2018-05-24 MED ORDER — HYDRALAZINE HCL 20 MG/ML IJ SOLN: 5.0000 mg | INTRAMUSCULAR | Status: DC | PRN

## 2018-05-24 MED ORDER — HYDRALAZINE HCL 20 MG/ML IJ SOLN
INTRAMUSCULAR | Status: AC
  Filled 2018-05-24: qty 1

## 2018-05-24 MED ORDER — SODIUM CHLORIDE 0.9% FLUSH
3.0000 mL | Freq: Two times a day (BID) | INTRAVENOUS | Status: DC
  Administered 2018-05-25 – 2018-05-29 (×9): 3 mL via INTRAVENOUS

## 2018-05-24 SURGICAL SUPPLY — 29 items
BALLN COYOTE OTW 2.5X220X150 (BALLOONS) ×3
BALLOON COYOTE OTW 2.5X220X150 (BALLOONS) IMPLANT
CATH BEACON 5 .035 65 KMP TIP (CATHETERS) ×1 IMPLANT
CATH CXI 2.3F 150 ANG (CATHETERS) ×1 IMPLANT
CATH CXI SUPP 2.6F 150 ANG (CATHETERS) ×1 IMPLANT
CATH OMNI FLUSH 5F 65CM (CATHETERS) ×1 IMPLANT
CATH TELEPORT (CATHETERS) ×1 IMPLANT
CATH TEMPO AQUA 5F 100CM (CATHETERS) ×1 IMPLANT
CLOSURE MYNX CONTROL 6F/7F (Vascular Products) ×1 IMPLANT
CROWN STEALTH MICRO-30 1.25MM (CATHETERS) ×1 IMPLANT
KIT ENCORE 26 ADVANTAGE (KITS) ×1 IMPLANT
KIT MICROPUNCTURE NIT STIFF (SHEATH) ×1 IMPLANT
KIT PV (KITS) ×3 IMPLANT
LUBRICANT VIPERSLIDE CORONARY (MISCELLANEOUS) ×1 IMPLANT
SHEATH FLEX ANSEL ANG 6F 45CM (SHEATH) ×1 IMPLANT
SHEATH PINNACLE 6F 10CM (SHEATH) ×3 IMPLANT
SHEATH PROBE COVER 6X72 (BAG) ×1 IMPLANT
SHIELD RADPAD SCOOP 12X17 (MISCELLANEOUS) ×1 IMPLANT
STOPCOCK MORSE 400PSI 3WAY (MISCELLANEOUS) ×1 IMPLANT
SYR MEDRAD MARK 7 150ML (SYRINGE) ×3 IMPLANT
TRANSDUCER W/STOPCOCK (MISCELLANEOUS) ×3 IMPLANT
TRAY PV CATH (CUSTOM PROCEDURE TRAY) ×3 IMPLANT
TUBING CIL FLEX 10 FLL-RA (TUBING) ×1 IMPLANT
WIRE APPROACH CTO .014 6G 190C (WIRE) ×1 IMPLANT
WIRE BENTSON .035X145CM (WIRE) ×1 IMPLANT
WIRE G V18X300CM (WIRE) ×1 IMPLANT
WIRE ROSEN-J .035X180CM (WIRE) ×1 IMPLANT
WIRE SPARTACORE .014X300CM (WIRE) ×1 IMPLANT
WIRE VIPER WIRECTO 0.014 (WIRE) ×1 IMPLANT

## 2018-05-24 NOTE — Progress Notes (Signed)
Patient arrived to 4E room 01 at this time. Telemetry applied and CCMD notified. V/s and assessment done. Patient oriented to room and how to call nurse with any needs.

## 2018-05-24 NOTE — Op Note (Signed)
Patient name: Glenda [name] MRN: [id number] DOB: 1934/01/04 Sex: female  05/24/2018 Pre-operative Diagnosis: Critical limb ischemia of the right lower extremity with tissue loss and great toe dry gangrene at tip Post-operative diagnosis:  Same Surgeon:  [name], MD Procedure Performed: 1.  Ultrasound-guided access of the left common femoral artery 2.  Right lower extremity arteriogram with selection of third order branches 3.  Anterior tibial artery mechanical atherectomy (1.25 miscro CSI atherectomy catheter) 4.  Anterior tibial artery angioplasty (2.5 mm x 220 mm Coyote) 5.  Mynx closure of the left common femoral artery  Indications: Patient is an 85 year old female who presented to clinic with bilateral lower extremity tissue loss.  Ultimately she has bilateral lower extremity critical limb ischemia.  She has undergone an attempt at retrograde tibial access of her left lower extremity and subsequent toe amputation.  She presents today for right lower extremity intervention on a very diseased anterior tibial artery as her single-vessel runoff.  All risks and benefits were discussed with the patient including high risk for limb loss.  Findings: Aortogram was deferred given that it was done several weeks ago.  I also deferred a right lower extremity arteriogram other than after I selected the right popliteal artery I shot a tibial angiogram.  Ultimately this showed single-vessel runoff via the anterior tibial artery that was very diseased with greater than 90% near occlusive stenosis throughout its entire course and heavily calcified but did appear to be inline down to the dorsalis pedis in the foot.  The peroneal and tibioperoneal trunk were also patent proximally but ultimately occluded in the distal calf.  I have a lot of difficulty crossing anterior tibial artery given the degree of stenosis and had to use an 0.014 system and ultimately performed atherectomy at low and medium  speeds given high risk for thromboembolism with single-vessel tibial runoff.  Ultimately this was angioplastied with a 2.5 mm angioplasty balloon and significant improvement with less than 30% residual stenosis and heavily calcified artery.  Flow was very very sluggish on the right leg likely related to a central process that has been noted bilaterally.   Procedure:  The patient was identified in the holding area and taken to room 8.  The patient was then placed supine on the table and prepped and draped in the usual sterile fashion.  A time out was called.  Ultrasound was used to evaluate the left common femoral artery.  It was patent .  A digital ultrasound image was acquired.  A micropuncture needle was used to access the left common femoral artery under ultrasound guidance.  An 018 wire was advanced without resistance and a micropuncture sheath was placed.  The 018 wire was removed and a benson wire was placed.  The micropuncture sheath was exchanged for a 5 french sheath.  I used a Omni Flush catheter and a Bentson wire to select the contralateral iliac bifurcation ultimately I exchanged for a Rosen wire once I advanced into what I thought was the right external iliac and placed a long 6 French Ansell sheath in the left common femoral artery.  Patient was given 100 units of heparin per kilogram.  Ultimately my sheath was in the hypogastric and had to reposition the sheath with a KMP catheter and advanced the sheath into the right common femoral artery.  We obtained right tibial runoff arteriogram. I then used a CXI catheter and a V 18 wire and selected the below-knee popliteal artery where I then  obtained some injections of the tibial vessels for a roadmap.  Ultimately I selected the anterior tibial artery single-vessel runoff abut unfortunately I could get my wire down the anterior tibial but could not get the 018 CXI catheter to track.  I then exchanged for a 014 wire with an 014 CXI catheter and again  could not get my catheter to track.  Finally I used a coronary hydrophilic catheter that I was able to track down the anterior tibial artery and then exchanged for a Viper wire.  Ultimately we hooked up the CSI atherectomy system and using low and medium speeds performed mechanical atherectomy of the proximal anterior tibial and mid anterior tibial and then once this was complete I performed balloon angioplasty of the entire segment using a 2.5 mm x 220 mm Coyote..  A final injection arteriogram using a long 035 catheter with a Toomey syringe that showed very sluggish flow but a less than 30% residual stenosis in the anterior tibial artery but there was a focal lesion distally so put my 2.5 mm balloon back down and re-angioplastied this second time.  Nitroglycerin was given throughout the case as well as a Viper slide.  Ultimately a final injection showed significant improvement in a heavily calcified diseased anterior tibial artery with flow although sluggish down into the dorsalis pedis and filling of the foot.  At that point in time our catheters and wires were removed and exchanged for a short 6 French sheath in the left groin.  A mynx closure device was deployed and additional manual pressure was held.    [name], MD [facility] Office: [phone] Pager: [place]

## 2018-05-24 NOTE — Plan of Care (Signed)
  Problem: Education: Goal: Knowledge of General Education information will improve Description Including pain rating scale, medication(s)/side effects and non-pharmacologic comfort measures Outcome: Progressing   Problem: Health Behavior/Discharge Planning: Goal: Ability to manage health-related needs will improve Outcome: Progressing   

## 2018-05-24 NOTE — H&P (Signed)
History and Physical Interval Note:  05/24/2018 10:03 AM  Glenda [name]  has presented today for surgery, with the diagnosis of pvd ulcer  The various methods of treatment have been discussed with the patient and family. After consideration of risks, benefits and other options for treatment, the patient has consented to  Procedure(s): ABDOMINAL AORTOGRAM W/LOWER EXTREMITY (N/A) as a surgical intervention .  The patient's history has been reviewed, patient examined, no change in status, stable for surgery.  I have reviewed the patient's chart and labs.  Questions were answered to the patient's satisfaction.    Right leg intervention for tissue loss.  [name]  Patient name: Glenda [name]         MRN: [id number]        DOB: March 14, 1934        Sex: female  REASON FOR VISIT: Follow-up after bilateral lower extremity angiogram and left great toe amp in setting of bilateral lower extremity tissue loss  HPI: Glenda [name] is a 85 y.o. female that presents for follow-up status post aortogram/bilateral lower extremity arteriogram for bilateral lower extremity tissue loss and left great toe ray amp on 04/28/2018.  Patient reports no new issues in the interim including no fevers or chills at home.  We previously accessed her right groin with planned to intervene on the left lower extremity where she had single-vessel peroneal runoff.  She also has tissue loss on the right great toe and has single-vessel runoff via the anterior tibial artery that is severely diseased.  Dialysis days are Tues, Thurs, Sat.      Past Medical History:  Diagnosis Date  . Arthritis    patient denies  . Breast cancer ([place])    Breast Cancer right   treated with radiation  . CAD (coronary artery disease)   . Chest pain   . CHF (congestive heart failure) ([place])   . Dizziness   . DM (diabetes mellitus) ([place])    TYpe II  . ESRD (end stage renal disease) ([place])    TTHSAT- [address]  . GERD  (gastroesophageal reflux disease)   . HLD (hyperlipidemia)   . HTN (hypertension)   . Obesity   . PAF (paroxysmal atrial fibrillation) ([place])   . Radiation 09/11/15-10/13/15   42.72 Gy to right breast, 12 Gy boost to right breast  . Shortness of breath dyspnea    04/27/2018- not so much since she began dialysis  . Sleep apnea    no longer uses cpap   . Stroke [facility])    balance issues - walks with walker and uses wheelchair  Had 3 in 2017- last 1 was in October  . UTI (urinary tract infection)          Past Surgical History:  Procedure Laterality Date  . ABDOMINAL AORTOGRAM W/LOWER EXTREMITY N/A 04/27/2018   Procedure: ABDOMINAL AORTOGRAM W/LOWER EXTREMITY;  Surgeon: [name], MD;  Location: [facility];  Service: Cardiovascular;  Laterality: N/A;  . AMPUTATION Left 04/28/2018   Procedure: AMPUTATION RAY LEFT;  Surgeon: [name], MD;  Location: [facility] OR;  Service: Vascular;  Laterality: Left;  . AV FISTULA PLACEMENT Left 07/14/2017   Procedure: Left arm Brachicephalic Fistula Creation;  Surgeon: Serafina Mitchell, MD;  Location: [place];  Service: Vascular;  Laterality: Left;  . BALLOON DILATION N/A 08/18/2017   Procedure: BALLOON DILATION;  Surgeon: Clarene Essex, MD;  Location: [place];  Service: Endoscopy;  Laterality: N/A;  . BASCILIC VEIN TRANSPOSITION Left  10/12/2017   Procedure: SECOND STAGE BASILIC VEIN TRANSPOSITION LEFT ARM;  Surgeon: Serafina Mitchell, MD;  Location: [place];  Service: Vascular;  Laterality: Left;  . CHOLECYSTECTOMY    . colon polyps; hx    . CORONARY ANGIOPLASTY  02/14/09  . ESOPHAGOGASTRODUODENOSCOPY (EGD) WITH PROPOFOL Left 09/08/2015   Procedure: ESOPHAGOGASTRODUODENOSCOPY (EGD) WITH PROPOFOL;  Surgeon: Clarene Essex, MD;  Location: [facility] ENDOSCOPY;  Service: Endoscopy;  Laterality: Left;  . ESOPHAGOGASTRODUODENOSCOPY (EGD) WITH PROPOFOL N/A 08/18/2017   Procedure: ESOPHAGOGASTRODUODENOSCOPY (EGD) WITH PROPOFOL;   Surgeon: Clarene Essex, MD;  Location: [place];  Service: Endoscopy;  Laterality: N/A;  with flouroscopy  . IR FLUORO GUIDE CV LINE RIGHT  08/25/2017  . IR FLUORO GUIDE CV LINE RIGHT  08/29/2017  . IR US GUIDE VASC ACCESS RIGHT  08/25/2017  . RADIOACTIVE SEED GUIDED PARTIAL MASTECTOMY WITH AXILLARY SENTINEL LYMPH NODE BIOPSY Right 07/17/2015   Procedure: RADIOACTIVE SEED GUIDED PARTIAL MASTECTOMY WITH AXILLARY SENTINEL LYMPH NODE BIOPSY;  Surgeon: Erroll Luna, MD;  Location: [place];  Service: General;  Laterality: Right;         Family History  Problem Relation Age of Onset  . Diabetes Mother   . Colon cancer Father        also had lung  . Diabetes Other   . Heart attack Other   . Diabetes Sister        [name]  . Breast cancer Sister     SOCIAL HISTORY: Social History        Tobacco Use  . Smoking status: Never Smoker  . Smokeless tobacco: Never Used  Substance Use Topics  . Alcohol use: No    Alcohol/week: 0.0 standard drinks    No Known Allergies        Current Outpatient Medications  Medication Sig Dispense Refill  . acetaminophen (TYLENOL) 650 MG CR tablet Take 1,300 mg by mouth every 8 (eight) hours as needed for pain.    Marland Kitchen aspirin EC 325 MG tablet Take 325 mg by mouth every evening.     Marland Kitchen b complex-vitamin c-folic acid (NEPHRO-VITE) 0.8 MG TABS tablet Take 1 tablet by mouth every evening.    . carbamide peroxide (DEBROX) 6.5 % OTIC solution Place 2 drops into both ears daily as needed (for ear wax).    . Cholecalciferol (VITAMIN D3) 25 MCG (1000 UT) CAPS Take 1,000 Units by mouth every evening.    . citalopram (CELEXA) 20 MG tablet Take 20 mg by mouth every evening.     . cloNIDine (CATAPRES) 0.1 MG tablet Take 0.1 mg by mouth See admin instructions. Take 0.1 mg by mouth in the evening and 0.1 mg by mouth at bedtime    . donepezil (ARICEPT) 5 MG tablet Take 5 mg by mouth at bedtime.    . famotidine (PEPCID AC) 10 MG chewable tablet  Chew 10 mg by mouth daily as needed for heartburn.    Marland Kitchen HUMALOG MIX 50/50 KWIKPEN (50-50) 100 UNIT/ML Kwikpen Inject 14-20 Units into the skin See admin instructions. Inject 20 units in the morning and 14 units at evening; hold for blood sugar readings lower than 200  11  . isosorbide mononitrate (IMDUR) 30 MG 24 hr tablet Take 1 tablet (30 mg total) daily by mouth. (Patient taking differently: Take 30 mg by mouth every evening. ) 30 tablet 0  . metoprolol succinate (TOPROL-XL) 25 MG 24 hr tablet Take 1 tablet (25 mg total) by mouth daily. (Patient taking differently: Take 12.5 mg  by mouth every evening. ) 30 tablet 0  . oxyCODONE-acetaminophen (PERCOCET/ROXICET) 5-325 MG tablet Take 1-2 tablets by mouth every 6 (six) hours as needed for severe pain. 20 tablet 0  . pantoprazole (PROTONIX) 20 MG tablet Take 20 mg by mouth every evening.     . pravastatin (PRAVACHOL) 80 MG tablet Take 80 mg by mouth at bedtime.     Marland Kitchen warfarin (COUMADIN) 5 MG tablet Take 1.5 tablets (7.5 mg total) by mouth one time only at 6 PM. 30 tablet 0   No current facility-administered medications for this visit.     REVIEW OF SYSTEMS:  [X]  denotes positive finding, [ ]  denotes negative finding Cardiac  Comments:  Chest pain or chest pressure:    Shortness of breath upon exertion:    Short of breath when lying flat:    Irregular heart rhythm:        Vascular    Pain in calf, thigh, or hip brought on by ambulation:    Pain in feet at night that wakes you up from your sleep:     Blood clot in your veins:    Leg swelling:         Pulmonary    Oxygen at home:    Productive cough:     Wheezing:         Neurologic    Sudden weakness in arms or legs:     Sudden numbness in arms or legs:     Sudden onset of difficulty speaking or slurred speech:    Temporary loss of vision in one eye:     Problems with dizziness:         Gastrointestinal    Blood in  stool:     Vomited blood:         Genitourinary    Burning when urinating:     Blood in urine:        Psychiatric    Major depression:         Hematologic    Bleeding problems:    Problems with blood clotting too easily:        Skin    Rashes or ulcers:        Constitutional    Fever or chills:      PHYSICAL EXAM:    Vitals:   05/16/18 0830  BP: (!) 149/63  Pulse: 62  Resp: 18  Temp: (!) 97.5 F (36.4 C)  TempSrc: Oral  SpO2: 100%  Weight: 142 lb (64.4 kg)  Height: 5\' 6"  (1.676 m)    GENERAL: The patient is a well-nourished female, in no acute distress. The vital signs are documented above. VASCULAR:  Right groin without hematoma. Ray amp left great toe - no drainage, scabbing over with some signs of healing, some discoloration to dorsum of foot Left great toe ulceration Biphasic R DP signal Biphasic L DP/PT signal PULMONARY: There is good air exchange bilaterally without wheezing or rales. ABDOMEN: Soft and non-tender with normal pitched bowel sounds.  MUSCULOSKELETAL: There are no major deformities or cyanosis.   DATA:   None  Assessment/Plan:  Discussed with patient and her daughter that the ray amputation of the left great toe is showing some signs of healing but will require very close observation given some discoloration of tissue on the dorsum of the foot.  She has single-vessel peroneal runoff on the left and we attempted recanalization of her anterior posterior tibial artery unsuccessfully.  I think while we continue to  monitor the left great toe amputation site I will plan to set her up for right lower extremity intervention given single-vessel anterior tibial runoff that is very diseased with tissue loss in the right great toe as well.  We will plan for right lower extremity arteriogram and intervention next Wednesday which is nondialysis day and I will plan to take the sutures out of her left great  toe at that time as that will be around 3 weeks since her amputation.  Discussed that if the ray amp of the left great toe fails to heal and she would require a TMA which would be high risk versus a BKA.  Confirmed she is not on coumadin.  Also getting IV antibiotics through dialysis for 6 weeks for osteo of left metatarsal.   [name], MD [facility] Office: [phone] Pager: [phone]

## 2018-05-25 ENCOUNTER — Encounter (HOSPITAL_COMMUNITY): Payer: Self-pay | Admitting: Vascular Surgery

## 2018-05-25 LAB — RENAL FUNCTION PANEL
Albumin: 2.5 g/dL — ABNORMAL LOW (ref 3.5–5.0)
Anion gap: 14 (ref 5–15)
BUN: 42 mg/dL — ABNORMAL HIGH (ref 8–23)
CO2: 26 mmol/L (ref 22–32)
Calcium: 8.4 mg/dL — ABNORMAL LOW (ref 8.9–10.3)
Chloride: 92 mmol/L — ABNORMAL LOW (ref 98–111)
Creatinine, Ser: 6.82 mg/dL — ABNORMAL HIGH (ref 0.44–1.00)
GFR calc Af Amer: 6 mL/min — ABNORMAL LOW (ref >60)
GFR calc non Af Amer: 5 mL/min — ABNORMAL LOW (ref >60)
Glucose, Bld: 316 mg/dL — ABNORMAL HIGH (ref 70–99)
Phosphorus: 4.3 mg/dL (ref 2.5–4.6)
Potassium: 3.5 mmol/L (ref 3.5–5.1)
Sodium: 132 mmol/L — ABNORMAL LOW (ref 135–145)

## 2018-05-25 LAB — GLUCOSE, CAPILLARY
GLUCOSE-CAPILLARY: 292 mg/dL — AB (ref 70–99)
Glucose-Capillary: 193 mg/dL — ABNORMAL HIGH (ref 70–99)
Glucose-Capillary: 261 mg/dL — ABNORMAL HIGH (ref 70–99)
Glucose-Capillary: 295 mg/dL — ABNORMAL HIGH (ref 70–99)

## 2018-05-25 LAB — CBC
HCT: 29.6 % — ABNORMAL LOW (ref 36.0–46.0)
Hemoglobin: 9.1 g/dL — ABNORMAL LOW (ref 12.0–15.0)
MCH: 30.3 pg (ref 26.0–34.0)
MCHC: 30.7 g/dL (ref 30.0–36.0)
MCV: 98.7 fL (ref 80.0–100.0)
NRBC: 0.4 % — AB (ref 0.0–0.2)
Platelets: 147 10*3/uL — ABNORMAL LOW (ref 150–400)
RBC: 3 MIL/uL — ABNORMAL LOW (ref 3.87–5.11)
RDW: 18.2 % — ABNORMAL HIGH (ref 11.5–15.5)
WBC: 7.1 10*3/uL (ref 4.0–10.5)

## 2018-05-25 MED ORDER — NEPRO/CARBSTEADY PO LIQD
237.0000 mL | Freq: Two times a day (BID) | ORAL | Status: DC
  Administered 2018-05-28 – 2018-05-29 (×3): 237 mL via ORAL

## 2018-05-25 MED ORDER — LIDOCAINE-PRILOCAINE 2.5-2.5 % EX CREA
1.0000 "application " | TOPICAL_CREAM | CUTANEOUS | Status: DC | PRN
  Filled 2018-05-25: qty 5

## 2018-05-25 MED ORDER — PENTAFLUOROPROP-TETRAFLUOROETH EX AERO: 1.0000 "application " | INHALATION_SPRAY | CUTANEOUS | Status: DC | PRN

## 2018-05-25 MED ORDER — INSULIN ASPART 100 UNIT/ML ~~LOC~~ SOLN
0.0000 [IU] | Freq: Three times a day (TID) | SUBCUTANEOUS | Status: DC
  Administered 2018-05-25 – 2018-05-26 (×2): 5 [IU] via SUBCUTANEOUS
  Administered 2018-05-26: 2 [IU] via SUBCUTANEOUS
  Administered 2018-05-27: 3 [IU] via SUBCUTANEOUS
  Administered 2018-05-27: 2 [IU] via SUBCUTANEOUS

## 2018-05-25 MED ORDER — HEPARIN SODIUM (PORCINE) 1000 UNIT/ML IJ SOLN
INTRAMUSCULAR | Status: AC
  Filled 2018-05-25: qty 2

## 2018-05-25 MED ORDER — ACETAMINOPHEN 325 MG PO TABS
ORAL_TABLET | ORAL | Status: AC
  Administered 2018-05-25: 650 mg via ORAL
  Filled 2018-05-25: qty 2

## 2018-05-25 MED ORDER — LIDOCAINE HCL (PF) 1 % IJ SOLN: 5.0000 mL | INTRAMUSCULAR | Status: DC | PRN

## 2018-05-25 MED ORDER — SODIUM CHLORIDE 0.9 % IV SOLN: 100.0000 mL | INTRAVENOUS | Status: DC | PRN

## 2018-05-25 MED ORDER — ALTEPLASE 2 MG IJ SOLR: 2.0000 mg | Freq: Once | INTRAMUSCULAR | Status: DC | PRN

## 2018-05-25 MED ORDER — ALUM & MAG HYDROXIDE-SIMETH 200-200-20 MG/5ML PO SUSP
30.0000 mL | ORAL | Status: DC | PRN
  Administered 2018-05-25: 30 mL via ORAL
  Filled 2018-05-25: qty 30

## 2018-05-25 MED ORDER — RENA-VITE PO TABS
1.0000 | ORAL_TABLET | Freq: Every day | ORAL | Status: DC
  Administered 2018-05-26 – 2018-05-29 (×5): 1 via ORAL
  Filled 2018-05-25 (×5): qty 1

## 2018-05-25 MED ORDER — CLONIDINE HCL 0.1 MG PO TABS
0.1000 mg | ORAL_TABLET | Freq: Two times a day (BID) | ORAL | Status: DC
  Administered 2018-05-25 – 2018-05-30 (×10): 0.1 mg via ORAL
  Filled 2018-05-25 (×9): qty 1

## 2018-05-25 MED ORDER — HEPARIN SODIUM (PORCINE) 1000 UNIT/ML DIALYSIS
20.0000 [IU]/kg | INTRAMUSCULAR | Status: DC | PRN
  Filled 2018-05-25: qty 2

## 2018-05-25 MED ORDER — CEFAZOLIN SODIUM-DEXTROSE 2-4 GM/100ML-% IV SOLN
2.0000 g | INTRAVENOUS | Status: AC
  Administered 2018-05-26: 2 g via INTRAVENOUS
  Filled 2018-05-25 (×2): qty 100

## 2018-05-25 MED ORDER — CHLORHEXIDINE GLUCONATE CLOTH 2 % EX PADS
6.0000 | MEDICATED_PAD | Freq: Every day | CUTANEOUS | Status: DC
  Administered 2018-05-25 – 2018-05-29 (×3): 6 via TOPICAL

## 2018-05-25 MED ORDER — HEPARIN SODIUM (PORCINE) 1000 UNIT/ML DIALYSIS
1000.0000 [IU] | INTRAMUSCULAR | Status: DC | PRN
  Filled 2018-05-25: qty 1

## 2018-05-25 MED ORDER — CALCITRIOL 0.25 MCG PO CAPS
0.2500 ug | ORAL_CAPSULE | ORAL | Status: DC
  Administered 2018-05-25 – 2018-05-30 (×3): 0.25 ug via ORAL
  Filled 2018-05-25 (×2): qty 1

## 2018-05-25 MED ORDER — INSULIN GLARGINE 100 UNIT/ML ~~LOC~~ SOLN
10.0000 [IU] | Freq: Every day | SUBCUTANEOUS | Status: DC
  Administered 2018-05-25 – 2018-05-28 (×4): 10 [IU] via SUBCUTANEOUS
  Filled 2018-05-25 (×4): qty 0.1

## 2018-05-25 NOTE — Consult Note (Addendum)
I have seen and examined this patient and agree with the plan of care   Plan HD TTS Left BKA planned in 1 31 2020.  Sherril Croon 05/25/2018, 1:28 PM [facility] Renal Consultation Note    Indication for Consultation:  Management of ESRD/hemodialysis; anemia, hypertension/volume and secondary hyperparathyroidism Referring physician: Monica Martinez, MD  HPI: Glenda [name] is a 85 y.o. female with ESRD on TTS HD at [facility] with hx HTN, PVD, DM, treated breast cancer admitted for bilateral LE angiogram with nonhealing left great toe amputation.  She had intervention on the right with atherectomy and PTA of tibial artery with plans for left BKA tomorrow. She has no pain or LE sensation, no SOB, CP, fever, chills, N,V. She has no problems with dialysis. Her five children rotate staying with her and she att[facility]. She is due for dialysis today.  Past Medical History:  Diagnosis Date  . Arthritis    patient denies  . Breast cancer ([place])    Breast Cancer right   treated with radiation  . CAD (coronary artery disease)   . Chest pain   . CHF (congestive heart failure) ([place])   . Dizziness   . DM (diabetes mellitus) ([place])    TYpe II  . ESRD (end stage renal disease) ([place])    TTHSAT- [address]  . GERD (gastroesophageal reflux disease)   . HLD (hyperlipidemia)   . HTN (hypertension)   . Obesity   . PAF (paroxysmal atrial fibrillation) ([place])   . Radiation 09/11/15-10/13/15   42.72 Gy to right breast, 12 Gy boost to right breast  . Shortness of breath dyspnea    04/27/2018- not so much since she began dialysis  . Sleep apnea    no longer uses cpap   . Stroke [facility])    balance issues - walks with walker and uses wheelchair  Had 3 in 2017- last 1 was in October  . UTI (urinary tract infection)    Past Surgical History:  Procedure Laterality Date  . ABDOMINAL AORTOGRAM W/LOWER EXTREMITY N/A 04/27/2018   Procedure: ABDOMINAL AORTOGRAM W/LOWER EXTREMITY;  Surgeon: [name], MD;  Location: [place] CV LAB;  Service: Cardiovascular;  Laterality: N/A;  . AMPUTATION Left 04/28/2018   Procedure: AMPUTATION RAY LEFT;  Surgeon: [name], MD;  Location: [facility] OR;  Service: Vascular;  Laterality: Left;  . AV FISTULA PLACEMENT Left 07/14/2017   Procedure: Left arm Brachicephalic Fistula Creation;  Surgeon: Serafina Mitchell, MD;  Location: [place];  Service: Vascular;  Laterality: Left;  . BALLOON DILATION N/A 08/18/2017   Procedure: BALLOON DILATION;  Surgeon: Clarene Essex, MD;  Location: [place];  Service: Endoscopy;  Laterality: N/A;  . BASCILIC VEIN TRANSPOSITION Left 10/12/2017   Procedure: SECOND STAGE BASILIC VEIN TRANSPOSITION LEFT ARM;  Surgeon: Serafina Mitchell, MD;  Location: [place];  Service: Vascular;  Laterality: Left;  . CHOLECYSTECTOMY    . colon polyps; hx    . CORONARY ANGIOPLASTY  02/14/09  . ESOPHAGOGASTRODUODENOSCOPY (EGD) WITH PROPOFOL Left 09/08/2015   Procedure: ESOPHAGOGASTRODUODENOSCOPY (EGD) WITH PROPOFOL;  Surgeon: Clarene Essex, MD;  Location: [facility] ENDOSCOPY;  Service: Endoscopy;  Laterality: Left;  . ESOPHAGOGASTRODUODENOSCOPY (EGD) WITH PROPOFOL N/A 08/18/2017   Procedure: ESOPHAGOGASTRODUODENOSCOPY (EGD) WITH PROPOFOL;  Surgeon: Clarene Essex, MD;  Location: [place];  Service: Endoscopy;  Laterality: N/A;  with flouroscopy  . IR FLUORO GUIDE CV LINE RIGHT  08/25/2017  . IR FLUORO GUIDE CV LINE RIGHT  08/29/2017  .  IR US GUIDE VASC ACCESS RIGHT  08/25/2017  . LOWER EXTREMITY ANGIOGRAPHY Right 05/24/2018   Procedure: Lower Extremity Angiography;  Surgeon: [name], MD;  Location: [place] CV LAB;  Service: Cardiovascular;  Laterality: Right;  . PERIPHERAL VASCULAR ATHERECTOMY Right 05/24/2018   Procedure: PERIPHERAL VASCULAR ATHERECTOMY;  Surgeon: [name], MD;  Location: [place] CV LAB;  Service: Cardiovascular;  Laterality: Right;  Anterial Tibial  . RADIOACTIVE SEED GUIDED PARTIAL MASTECTOMY WITH  AXILLARY SENTINEL LYMPH NODE BIOPSY Right 07/17/2015   Procedure: RADIOACTIVE SEED GUIDED PARTIAL MASTECTOMY WITH AXILLARY SENTINEL LYMPH NODE BIOPSY;  Surgeon: Erroll Luna, MD;  Location: [place];  Service: General;  Laterality: Right;   Family History  Problem Relation Age of Onset  . Diabetes Mother   . Colon cancer Father        also had lung  . Diabetes Other   . Heart attack Other   . Diabetes Sister        [name]  . Breast cancer Sister    Social History:  reports that she has never smoked. She has never used smokeless tobacco. She reports that she does not drink alcohol or use drugs. No Known Allergies Prior to Admission medications   Medication Sig Start Date End Date Taking? Authorizing Provider  acetaminophen (TYLENOL) 650 MG CR tablet Take 1,300 mg by mouth every 8 (eight) hours as needed for pain.   Yes [provider]  aspirin EC 325 MG tablet Take 325 mg by mouth every evening.    Yes [provider]  b complex-vitamin c-folic acid (NEPHRO-VITE) 0.8 MG TABS tablet Take 1 tablet by mouth every evening.   Yes [provider]  calcium acetate (PHOSLO) 667 MG capsule Take 667 mg by mouth 3 (three) times daily with meals.   Yes [provider]  citalopram (CELEXA) 20 MG tablet Take 20 mg by mouth every evening.    Yes [provider]  donepezil (ARICEPT) 5 MG tablet Take 5 mg by mouth at bedtime.   Yes [provider]  famotidine (PEPCID AC) 10 MG chewable tablet Chew 10 mg by mouth daily as needed for heartburn.   Yes [provider]  HUMALOG MIX 50/50 KWIKPEN (50-50) 100 UNIT/ML Kwikpen Inject 14-20 Units into the skin See admin instructions. Inject 20 units in the morning and 14 units at evening; hold for blood sugar readings lower than 200 01/06/17  Yes [provider]  isosorbide mononitrate (IMDUR) 30 MG 24 hr tablet Take 1 tablet (30 mg total) daily by mouth. Patient taking differently: Take 30 mg by  mouth every evening.  03/06/17 10/08/18 Yes Thurnell Lose, MD  metoprolol succinate (TOPROL-XL) 25 MG 24 hr tablet Take 1 tablet (25 mg total) by mouth daily. Patient taking differently: Take 12.5 mg by mouth every evening.  09/01/17  Yes Ghimire, Henreitta Leber, MD  pantoprazole (PROTONIX) 20 MG tablet Take 20 mg by mouth every evening.    Yes [provider]  pravastatin (PRAVACHOL) 80 MG tablet Take 80 mg by mouth at bedtime.    Yes [provider]  cloNIDine (CATAPRES) 0.1 MG tablet Take 0.1 mg by mouth See admin instructions. Take 0.1 mg by mouth in the evening and 0.1 mg by mouth at bedtime    [provider]  oxyCODONE-acetaminophen (PERCOCET/ROXICET) 5-325 MG tablet Take 1-2 tablets by mouth every 6 (six) hours as needed for severe pain. Patient not taking: Reported on 05/22/2018 04/29/18   Fields,  Jessy Oto, MD   Current Facility-Administered Medications  Medication Dose Route Frequency Provider Last Rate Last Dose  . 0.9 %  sodium chloride infusion  250 mL Intravenous PRN [name], MD   Stopped at 05/24/18 2320  . acetaminophen (TYLENOL) tablet 650 mg  650 mg Oral Q4H PRN [name], MD      . aspirin EC tablet 325 mg  325 mg Oral QPM [name], MD   325 mg at 05/24/18 1700  . calcitRIOL (ROCALTROL) capsule 0.25 mcg  0.25 mcg Oral Q T,Th,Sa-HD Alric Seton, PA-C      . calcium acetate (PHOSLO) capsule 667 mg  667 mg Oral TID WC [name], MD   667 mg at 05/25/18 0845  . [START ON 05/26/2018] ceFAZolin (ANCEF) IVPB 2g/100 mL premix  2 g Intravenous To SS-Surg Dagoberto Ligas, PA-C      . Chlorhexidine Gluconate Cloth 2 % PADS 6 each  6 each Topical Q0600 Alric Seton, PA-C   6 each at 05/25/18 [phone]  . citalopram (CELEXA) tablet 20 mg  20 mg Oral QPM [name], MD   20 mg at 05/24/18 1700  . cloNIDine (CATAPRES) tablet 0.1 mg  0.1 mg Oral BID [name], MD   0.1 mg at 05/24/18 2139  . clopidogrel  (PLAVIX) tablet 75 mg  75 mg Oral Q breakfast [name], MD   75 mg at 05/25/18 0845  . donepezil (ARICEPT) tablet 5 mg  5 mg Oral QHS [name], MD   5 mg at 05/24/18 2139  . heparin injection 5,000 Units  5,000 Units Subcutaneous Q8H [name], MD   5,000 Units at 05/25/18 0604  . hydrALAZINE (APRESOLINE) injection 5 mg  5 mg Intravenous Q20 Min PRN [name], MD      . isosorbide mononitrate (IMDUR) 24 hr tablet 30 mg  30 mg Oral QPM [name], MD   30 mg at 05/24/18 1659  . labetalol (NORMODYNE,TRANDATE) injection 10 mg  10 mg Intravenous Q10 min PRN [name], MD   10 mg at 05/24/18 1350  . metoprolol succinate (TOPROL-XL) 24 hr tablet 12.5 mg  12.5 mg Oral QPM [name], MD   12.5 mg at 05/24/18 1658  . multivitamin (RENA-VIT) tablet 1 tablet  1 tablet Oral QHS Alric Seton, PA-C      . ondansetron [facility]) injection 4 mg  4 mg Intravenous Q6H PRN [name], MD   4 mg at 05/24/18 1416  . sodium chloride flush (NS) 0.9 % injection 3 mL  3 mL Intravenous Q12H [name], MD   3 mL at 05/25/18 0847  . sodium chloride flush (NS) 0.9 % injection 3 mL  3 mL Intravenous PRN [name], MD       Labs: Basic Metabolic Panel: Recent Labs  Lab 05/24/18 0952 05/24/18 1021 05/24/18 1551  NA  --  137  --   K  --  3.5  --   GLUCOSE  --  129*  --   CREATININE 4.90*  --  4.88*   Liver Function Tests: No results for input(s): AST, ALT, ALKPHOS, BILITOT, PROT, ALBUMIN in the last 168 hours. No results for input(s): LIPASE, AMYLASE in the last 168 hours. No results for input(s): AMMONIA in the last 168 hours. CBC: Recent Labs  Lab 05/24/18 1021 05/24/18 1551  WBC  --  7.3  HGB 15.3* 12.6  HCT  45.0 40.2  MCV  --  98.5  PLT  --  156   CBG: Recent Labs  Lab 05/24/18 0925 05/24/18 1339 05/24/18 1656 05/24/18 2057 05/25/18 0543  GLUCAP 138* 119* 157* 264* 193*   ROS: As per HPI  otherwise negative.  Physical Exam: Vitals:   05/24/18 1700 05/24/18 1949 05/24/18 2320 05/25/18 0602  BP: 119/63 (!) 119/53 (!) 122/41 (!) 127/44  Pulse: 73  71 69  Resp: 16 14 17 14   Temp:  98.6 F (37 C)  98.3 F (36.8 C)  TempSrc:  Oral  Oral  SpO2: 95% 96% 100% 99%  Weight:    63.6 kg  Height:         General: WDWN pleasant elderly somewhat frail lady NAD.  One of her daughters is at bedside Head: NCAT sclera not icteric MMM Neck: Supple.  Lungs: CTA bilaterally without wheezes, rales, or rhonchi. Breathing is unlabored. Heart: RRR with S1 S2.  Abdomen: soft NT + BS Lower extremities:without edema - left foot wrapped - right great toe dry gangrene of the tip Neuro: A & O  X 3. Moves all extremities spontaneously. Psych:  Responds to questions appropriately with a normal affect. Dialysis Access: left upper AVF + bruit  Dialysis Orders: TTS [facility] 4.25 hours 400/800 EDW 63 (ususally gets to 64.5 - 65) 2K 2 Ca left upper AVF heparin 6000 Mircera 75 q 2 wks - last 1/23 no Fe calcitriol 0.25 Recent labs: hgb 10.8 30% sat ferritin 2190 iPTH 296 Ca/P ok  Assessment/Plan: 1. PVD - for left BKA  1/31 due to nonhealing left great toe amputation and s/p intervention of right tibial artery with dry gangrene of right great toe - per VVS 2. ESRD -  TTS - HD today 3. Hypertension/volume  - BP controlled. Previously on clonidine - only on 1.2 5 MTP XL  now. 4. Anemia  - hgb 12.6 - hold on ESA for now - due next week -hx high ferritin likely inflammation 5. Metabolic bone disease -  Continue low dose calcitriol - on 1ca acetate ac 6. Nutrition - renal /vits - add nepro 7. DM - per primary  Myriam Jacobson, PA-C [place] [phone] 05/25/2018, 11:00 AM

## 2018-05-25 NOTE — Progress Notes (Signed)
[facility]  Subjective  - No complaints    Objective (!) 127/44 69 98.3 F (36.8 C) (Oral) 14 99%  Intake/Output Summary (Last 24 hours) at 05/25/2018 0855 Last data filed at 05/24/2018 2320 Gross per 24 hour  Intake 170 ml  Output -  Net 170 ml    Left groin c/d/i, no hematoma Non-healing left great toe amp Right DP palpable after AT atherectomy, dry gangrene to tip of right great toe  Laboratory Lab Results: Recent Labs    05/24/18 1021 05/24/18 1551  WBC  --  7.3  HGB 15.3* 12.6  HCT 45.0 40.2  PLT  --  156   BMET Recent Labs    05/24/18 0952 05/24/18 1021 05/24/18 1551  NA  --  137  --   K  --  3.5  --   GLUCOSE  --  129*  --   CREATININE 4.90*  --  4.88*    COAG Lab Results  Component Value Date   INR 0.96 04/28/2018   INR 0.99 04/28/2018   INR 1.00 10/12/2017   No results found for: PTT  Assessment/Planning:  Doing well POD#1 s/p R AT atherectomy for CLI with tissue loss.  Now has palpable right DP pulse.  Will consult nephrology today for dialysis needs.  NPO after midnight and plan for L BKA tomorrow.  L great toe amp non-healing and extensive discussion with family yesterday and want to proceed with BKA over TMA given severity of tibial disease and Ms. Bivens is tired of being in hospital.   [name] 05/25/2018 8:55 AM --

## 2018-05-25 NOTE — Progress Notes (Addendum)
Inpatient Diabetes Program Recommendations  AACE/ADA: New Consensus Statement on Inpatient Glycemic Control (2015)  Target Ranges:  Prepandial:   less than 140 mg/dL      Peak postprandial:   less than 180 mg/dL (1-2 hours)      Critically ill patients:  140 - 180 mg/dL   Lab Results  Component Value Date   GLUCAP 261 (H) 05/25/2018   HGBA1C 8.0 (H) 10/12/2017   Results for Glenda [name], Glenda [name] (MRN [id number]) as of 05/25/2018 14:41  Ref. Range 05/24/2018 09:25 05/24/2018 13:39 05/24/2018 16:56 05/24/2018 20:57 05/25/2018 05:43 05/25/2018 11:24  Glucose-Capillary Latest Ref Range: 70 - 99 mg/dL 138 (H) 119 (H) 157 (H) 264 (H) 193 (H) 261 (H)    DM2  Home DM meds: Humalog mix 50/50 Kwikpen : 20 units in am / 14 units in evening (hold for CBG under 200)  Current DM inpatient meds: none   Patient does not have any current insulin orders while inpatient. Her home dose is Humalog mix 50/50 (which daily would be a total of 17 units rapid acting and 17 units intermediate acting ).    MD please consider the following :   1. Add glycemic control order set   2. Novolog sensitive (0-9 units) correction tid ac  3. Lantus 10 units q hs (based on  0.15 units/kg)      Thank you.  -- Will follow during hospitalization.--  Jonna Clark RN, MSN Diabetes Coordinator Inpatient Glycemic Control Team Team Pager: [phone] (8am-5pm)

## 2018-05-26 ENCOUNTER — Encounter (HOSPITAL_COMMUNITY)
Admission: AD | Disposition: A | Discharge status: Skilled Nursing Facility | Payer: Self-pay | Source: Home / Self Care | Attending: Vascular Surgery

## 2018-05-26 ENCOUNTER — Inpatient Hospital Stay (HOSPITAL_COMMUNITY): Payer: Medicare (Managed Care) | Admitting: Certified Registered"

## 2018-05-26 ENCOUNTER — Other Ambulatory Visit: Payer: Self-pay

## 2018-05-26 ENCOUNTER — Encounter (HOSPITAL_COMMUNITY): Payer: Self-pay | Admitting: *Deleted

## 2018-05-26 DIAGNOSIS — T8789 Other complications of amputation stump: Secondary | ICD-10-CM

## 2018-05-26 HISTORY — PX: AMPUTATION: SHX166

## 2018-05-26 LAB — CBC
HCT: 31 % — ABNORMAL LOW (ref 36.0–46.0)
Hemoglobin: 9.7 g/dL — ABNORMAL LOW (ref 12.0–15.0)
MCH: 31.1 pg (ref 26.0–34.0)
MCHC: 31.3 g/dL (ref 30.0–36.0)
MCV: 99.4 fL (ref 80.0–100.0)
Platelets: 157 10*3/uL (ref 150–400)
RBC: 3.12 MIL/uL — ABNORMAL LOW (ref 3.87–5.11)
RDW: 18.1 % — ABNORMAL HIGH (ref 11.5–15.5)
WBC: 6.6 10*3/uL (ref 4.0–10.5)
nRBC: 0.5 % — ABNORMAL HIGH (ref 0.0–0.2)

## 2018-05-26 LAB — GLUCOSE, CAPILLARY
GLUCOSE-CAPILLARY: 108 mg/dL — AB (ref 70–99)
GLUCOSE-CAPILLARY: 285 mg/dL — AB (ref 70–99)
Glucose-Capillary: 154 mg/dL — ABNORMAL HIGH (ref 70–99)
Glucose-Capillary: 110 mg/dL — ABNORMAL HIGH (ref 70–99)
Glucose-Capillary: 143 mg/dL — ABNORMAL HIGH (ref 70–99)
Glucose-Capillary: 165 mg/dL — ABNORMAL HIGH (ref 70–99)
Glucose-Capillary: 347 mg/dL — ABNORMAL HIGH (ref 70–99)

## 2018-05-26 LAB — CREATININE, SERUM
Creatinine, Ser: 3.7 mg/dL — ABNORMAL HIGH (ref 0.44–1.00)
GFR calc Af Amer: 12 mL/min — ABNORMAL LOW (ref >60)
GFR calc non Af Amer: 11 mL/min — ABNORMAL LOW (ref >60)

## 2018-05-26 LAB — MRSA PCR SCREENING: MRSA by PCR: NEGATIVE

## 2018-05-26 SURGERY — AMPUTATION BELOW KNEE
Anesthesia: General | Site: Leg Lower | Laterality: Left

## 2018-05-26 MED ORDER — PROTAMINE SULFATE 10 MG/ML IV SOLN
INTRAVENOUS | Status: AC
  Filled 2018-05-26: qty 10

## 2018-05-26 MED ORDER — ROCURONIUM BROMIDE 50 MG/5ML IV SOSY
PREFILLED_SYRINGE | INTRAVENOUS | Status: AC
  Filled 2018-05-26: qty 5

## 2018-05-26 MED ORDER — PHENYLEPHRINE 40 MCG/ML (10ML) SYRINGE FOR IV PUSH (FOR BLOOD PRESSURE SUPPORT)
PREFILLED_SYRINGE | INTRAVENOUS | Status: DC | PRN
  Administered 2018-05-26: 120 ug via INTRAVENOUS

## 2018-05-26 MED ORDER — 0.9 % SODIUM CHLORIDE (POUR BTL) OPTIME
TOPICAL | Status: DC | PRN
  Administered 2018-05-26: 1000 mL

## 2018-05-26 MED ORDER — DARBEPOETIN ALFA 60 MCG/0.3ML IJ SOSY: 60.0000 ug | PREFILLED_SYRINGE | INTRAMUSCULAR | Status: DC

## 2018-05-26 MED ORDER — LIDOCAINE 2% (20 MG/ML) 5 ML SYRINGE
INTRAMUSCULAR | Status: DC | PRN
  Administered 2018-05-26: 60 mg via INTRAVENOUS

## 2018-05-26 MED ORDER — LABETALOL HCL 5 MG/ML IV SOLN
INTRAVENOUS | Status: AC
  Filled 2018-05-26: qty 4

## 2018-05-26 MED ORDER — CEFAZOLIN SODIUM-DEXTROSE 2-4 GM/100ML-% IV SOLN
2.0000 g | Freq: Three times a day (TID) | INTRAVENOUS | Status: AC
  Administered 2018-05-26 – 2018-05-27 (×2): 2 g via INTRAVENOUS
  Filled 2018-05-26 (×2): qty 100

## 2018-05-26 MED ORDER — GUAIFENESIN-DM 100-10 MG/5ML PO SYRP: 15.0000 mL | ORAL_SOLUTION | ORAL | Status: DC | PRN

## 2018-05-26 MED ORDER — HYDROCODONE-ACETAMINOPHEN 5-325 MG PO TABS
1.0000 | ORAL_TABLET | ORAL | Status: DC | PRN
Start: 2018-05-26 — End: 2018-05-30
  Administered 2018-05-26 – 2018-05-30 (×15): 2 via ORAL
  Filled 2018-05-26 (×14): qty 2

## 2018-05-26 MED ORDER — FENTANYL CITRATE (PF) 100 MCG/2ML IJ SOLN: 25.0000 ug | INTRAMUSCULAR | Status: DC | PRN

## 2018-05-26 MED ORDER — OXYCODONE HCL 5 MG/5ML PO SOLN: 5.0000 mg | Freq: Once | ORAL | Status: DC | PRN

## 2018-05-26 MED ORDER — EPHEDRINE SULFATE-NACL 50-0.9 MG/10ML-% IV SOSY
PREFILLED_SYRINGE | INTRAVENOUS | Status: DC | PRN
  Administered 2018-05-26: 15 mg via INTRAVENOUS
  Administered 2018-05-26 (×2): 10 mg via INTRAVENOUS

## 2018-05-26 MED ORDER — SODIUM CHLORIDE 0.9 % IV SOLN
INTRAVENOUS | Status: DC
  Administered 2018-05-26: 10:00:00 via INTRAVENOUS

## 2018-05-26 MED ORDER — ONDANSETRON HCL 4 MG/2ML IJ SOLN
INTRAMUSCULAR | Status: DC | PRN
  Administered 2018-05-26: 4 mg via INTRAVENOUS

## 2018-05-26 MED ORDER — PANTOPRAZOLE SODIUM 40 MG PO TBEC
40.0000 mg | DELAYED_RELEASE_TABLET | Freq: Every day | ORAL | Status: DC
  Administered 2018-05-26 – 2018-05-29 (×4): 40 mg via ORAL
  Filled 2018-05-26 (×4): qty 1

## 2018-05-26 MED ORDER — DEXAMETHASONE SODIUM PHOSPHATE 10 MG/ML IJ SOLN
INTRAMUSCULAR | Status: AC
  Filled 2018-05-26: qty 1

## 2018-05-26 MED ORDER — ONDANSETRON HCL 4 MG/2ML IJ SOLN
INTRAMUSCULAR | Status: AC
  Filled 2018-05-26: qty 2

## 2018-05-26 MED ORDER — PROPOFOL 10 MG/ML IV BOLUS
INTRAVENOUS | Status: AC
  Filled 2018-05-26: qty 20

## 2018-05-26 MED ORDER — HYDROCODONE-ACETAMINOPHEN 5-325 MG PO TABS
1.0000 | ORAL_TABLET | Freq: Four times a day (QID) | ORAL | Status: DC | PRN
  Filled 2018-05-26: qty 2

## 2018-05-26 MED ORDER — DOCUSATE SODIUM 100 MG PO CAPS
100.0000 mg | ORAL_CAPSULE | Freq: Every day | ORAL | Status: DC
  Administered 2018-05-27 – 2018-05-30 (×4): 100 mg via ORAL
  Filled 2018-05-26 (×3): qty 1

## 2018-05-26 MED ORDER — PHENOL 1.4 % MT LIQD: 1.0000 | OROMUCOSAL | Status: DC | PRN

## 2018-05-26 MED ORDER — FENTANYL CITRATE (PF) 100 MCG/2ML IJ SOLN
INTRAMUSCULAR | Status: DC | PRN
  Administered 2018-05-26 (×6): 25 ug via INTRAVENOUS

## 2018-05-26 MED ORDER — ACETAMINOPHEN 160 MG/5ML PO SOLN: 1000.0000 mg | Freq: Once | ORAL | Status: DC | PRN

## 2018-05-26 MED ORDER — ESMOLOL HCL 100 MG/10ML IV SOLN
INTRAVENOUS | Status: AC
  Filled 2018-05-26: qty 10

## 2018-05-26 MED ORDER — MORPHINE SULFATE (PF) 2 MG/ML IV SOLN
2.0000 mg | INTRAVENOUS | Status: DC | PRN
Start: 2018-05-26 — End: 2018-05-30
  Administered 2018-05-26 – 2018-05-28 (×7): 2 mg via INTRAVENOUS
  Filled 2018-05-26 (×7): qty 1

## 2018-05-26 MED ORDER — ACETAMINOPHEN 500 MG PO TABS: 1000.0000 mg | ORAL_TABLET | Freq: Once | ORAL | Status: DC | PRN

## 2018-05-26 MED ORDER — ACETAMINOPHEN 10 MG/ML IV SOLN: 1000.0000 mg | Freq: Once | INTRAVENOUS | Status: DC | PRN

## 2018-05-26 MED ORDER — DEXAMETHASONE SODIUM PHOSPHATE 10 MG/ML IJ SOLN
INTRAMUSCULAR | Status: DC | PRN
  Administered 2018-05-26: 10 mg via INTRAVENOUS

## 2018-05-26 MED ORDER — POTASSIUM CHLORIDE CRYS ER 20 MEQ PO TBCR: 20.0000 meq | EXTENDED_RELEASE_TABLET | Freq: Every day | ORAL | Status: DC | PRN

## 2018-05-26 MED ORDER — PHENYLEPHRINE 40 MCG/ML (10ML) SYRINGE FOR IV PUSH (FOR BLOOD PRESSURE SUPPORT)
PREFILLED_SYRINGE | INTRAVENOUS | Status: AC
  Filled 2018-05-26: qty 10

## 2018-05-26 MED ORDER — OXYCODONE HCL 5 MG PO TABS: 5.0000 mg | ORAL_TABLET | Freq: Once | ORAL | Status: DC | PRN

## 2018-05-26 MED ORDER — ALBUMIN HUMAN 5 % IV SOLN
INTRAVENOUS | Status: DC | PRN
  Administered 2018-05-26: 11:00:00 via INTRAVENOUS

## 2018-05-26 MED ORDER — FENTANYL CITRATE (PF) 250 MCG/5ML IJ SOLN
INTRAMUSCULAR | Status: AC
  Filled 2018-05-26: qty 5

## 2018-05-26 MED ORDER — LIDOCAINE 2% (20 MG/ML) 5 ML SYRINGE
INTRAMUSCULAR | Status: AC
  Filled 2018-05-26: qty 5

## 2018-05-26 MED ORDER — PROPOFOL 10 MG/ML IV BOLUS
INTRAVENOUS | Status: DC | PRN
  Administered 2018-05-26: 70 mg via INTRAVENOUS

## 2018-05-26 MED ORDER — MAGNESIUM SULFATE 2 GM/50ML IV SOLN: 2.0000 g | Freq: Every day | INTRAVENOUS | Status: DC | PRN

## 2018-05-26 MED ORDER — EPHEDRINE 5 MG/ML INJ
INTRAVENOUS | Status: AC
  Filled 2018-05-26: qty 10

## 2018-05-26 SURGICAL SUPPLY — 57 items
BANDAGE ACE 4X5 VEL STRL LF (GAUZE/BANDAGES/DRESSINGS) ×2 IMPLANT
BANDAGE ACE 6X5 VEL STRL LF (GAUZE/BANDAGES/DRESSINGS) ×1 IMPLANT
BANDAGE ESMARK 6X9 LF (GAUZE/BANDAGES/DRESSINGS) IMPLANT
BLADE SAW SGTL 73X25 THK (BLADE) ×2 IMPLANT
BNDG CMPR 9X6 STRL LF SNTH (GAUZE/BANDAGES/DRESSINGS)
BNDG COHESIVE 6X5 TAN STRL LF (GAUZE/BANDAGES/DRESSINGS) ×2 IMPLANT
BNDG ESMARK 6X9 LF (GAUZE/BANDAGES/DRESSINGS)
BNDG GAUZE ELAST 4 BULKY (GAUZE/BANDAGES/DRESSINGS) ×3 IMPLANT
CANISTER SUCT 3000ML PPV (MISCELLANEOUS) ×2 IMPLANT
CLIP VESOCCLUDE MED 6/CT (CLIP) ×1 IMPLANT
COVER BACK TABLE 60X90IN (DRAPES) IMPLANT
COVER SURGICAL LIGHT HANDLE (MISCELLANEOUS) ×2 IMPLANT
COVER WAND RF STERILE (DRAPES) ×2 IMPLANT
CUFF TOURNIQUET SINGLE 24IN (TOURNIQUET CUFF) IMPLANT
CUFF TOURNIQUET SINGLE 34IN LL (TOURNIQUET CUFF) IMPLANT
CUFF TOURNIQUET SINGLE 44IN (TOURNIQUET CUFF) IMPLANT
DRAIN CHANNEL 19F RND (DRAIN) IMPLANT
DRAPE HALF SHEET 40X57 (DRAPES) ×1 IMPLANT
DRAPE ORTHO SPLIT 77X108 STRL (DRAPES) ×4
DRAPE SURG ORHT 6 SPLT 77X108 (DRAPES) ×2 IMPLANT
DRSG ADAPTIC 3X8 NADH LF (GAUZE/BANDAGES/DRESSINGS) ×2 IMPLANT
ELECT REM PT RETURN 9FT ADLT (ELECTROSURGICAL) ×2
ELECTRODE REM PT RTRN 9FT ADLT (ELECTROSURGICAL) ×1 IMPLANT
EVACUATOR SILICONE 100CC (DRAIN) IMPLANT
GAUZE SPONGE 4X4 12PLY STRL (GAUZE/BANDAGES/DRESSINGS) ×3 IMPLANT
GLOVE BIO SURGEON STRL SZ 6.5 (GLOVE) ×1 IMPLANT
GLOVE BIO SURGEON STRL SZ7.5 (GLOVE) ×2 IMPLANT
GLOVE BIOGEL PI IND STRL 6.5 (GLOVE) IMPLANT
GLOVE BIOGEL PI IND STRL 8 (GLOVE) ×1 IMPLANT
GLOVE BIOGEL PI INDICATOR 6.5 (GLOVE) ×1
GLOVE BIOGEL PI INDICATOR 8 (GLOVE) ×1
GOWN STRL REUS W/ TWL LRG LVL3 (GOWN DISPOSABLE) ×2 IMPLANT
GOWN STRL REUS W/ TWL XL LVL3 (GOWN DISPOSABLE) ×2 IMPLANT
GOWN STRL REUS W/TWL LRG LVL3 (GOWN DISPOSABLE) ×4
GOWN STRL REUS W/TWL XL LVL3 (GOWN DISPOSABLE) ×4
KIT BASIN OR (CUSTOM PROCEDURE TRAY) ×2 IMPLANT
KIT TURNOVER KIT B (KITS) ×2 IMPLANT
NS IRRIG 1000ML POUR BTL (IV SOLUTION) ×2 IMPLANT
PACK GENERAL/GYN (CUSTOM PROCEDURE TRAY) ×2 IMPLANT
PAD ARMBOARD 7.5X6 YLW CONV (MISCELLANEOUS) ×4 IMPLANT
SPONGE LAP 18X18 RF (DISPOSABLE) ×1 IMPLANT
STAPLER VISISTAT 35W (STAPLE) ×2 IMPLANT
STOCKINETTE IMPERVIOUS LG (DRAPES) ×2 IMPLANT
SUT ETHILON 3 0 PS 1 (SUTURE) IMPLANT
SUT SILK 0 TIES 10X30 (SUTURE) IMPLANT
SUT SILK 2 0 (SUTURE) ×2
SUT SILK 2-0 18XBRD TIE 12 (SUTURE) ×1 IMPLANT
SUT SILK 3 0 (SUTURE) ×2
SUT SILK 3 0 SH CR/8 (SUTURE) ×2 IMPLANT
SUT SILK 3 0SH CR/8 30 (SUTURE) ×1 IMPLANT
SUT SILK 3-0 18XBRD TIE 12 (SUTURE) ×1 IMPLANT
SUT VIC AB 2-0 CT1 18 (SUTURE) ×5 IMPLANT
SUT VIC AB 3-0 SH 18 (SUTURE) IMPLANT
TAPE UMBILICAL COTTON 1/8X30 (MISCELLANEOUS) ×1 IMPLANT
TOWEL GREEN STERILE (TOWEL DISPOSABLE) ×4 IMPLANT
UNDERPAD 30X30 (UNDERPADS AND DIAPERS) ×2 IMPLANT
WATER STERILE IRR 1000ML POUR (IV SOLUTION) ×1 IMPLANT

## 2018-05-26 NOTE — Progress Notes (Signed)
@  approx. 0155 pt returned from HD. VSS, CBG checked, and evening meds given. Pt and daughter (@bedside ) updated on new surgery time per OR schedule ([id number]).

## 2018-05-26 NOTE — Op Note (Signed)
Date: May 26, 2018  Preoperative diagnosis: Nonhealing left great toe amputation in the setting of critical limb ischemia with severe tibial and small vessel disease  Postoperative Diagnosis: same  Procedure: Left below-knee amputation  Surgeon: Dr. [name], MD  Assistant: OR staff  Indications: Patient is an 85 year old female that previously presented to clinic with bilateral lower extremity tissue loss.  Her left lower extremity had severe tibial disease and small vessel disease and she had no options for recanalization after an attempt at retrograde tibial access.  She had a nonhealing left great toe amputation and presents today for below-knee amputation after risks and benefits including non-healing and need for above knee amputation in the future.  Findings: Marginal bleeding in the soft tissue with evidence of pulsatile inflow from the peroneal artery.  Anesthesia: General  Details: The patient was taken to the operating room after informed consent was obtained.  She was placed on operative table in supine position.  Her left leg was prepped and draped in sterile fashion after general anesthesia was induced.  We initially marked the tibia one handsbreadth below the tibial tuberosity where it was marked for the anterior flap.  I then used a suture that was cut to the circumferential length of the calf at the level for the anterior flap and then this was divided into two thirds segment and one third segment.  The two thirds segment was used to mark the length of the anterior posterior length of the flap and the one third segment was used to mark the length of the flap.  Once this was marked on the skin we then carried the dissection through the skin and soft tissue initially with a 15 blade scalpel and then a Bovie cautery.  Ultimately the anterior tibial artery was dissected out and ligated between hemostats and then tied off with 3-0 silk and 2-0 Vicryl suture.  We then  dissected around the fibula that was transected with a bone cutter.  The tibia was then transected with an oscillating saw.  We then developed a posterior flap of the gastrocnemius muscle and identified the peroneal and posterior tibial artery and veins that were ligated divided between hemostats and suture ligated with 3-0 silk and 2-0 Vicryl sutures.  The wound was then copiously irrigated with saline until the effluent was clear.  Ultimately the posterior flap was used to create a muscle flap over the end of the bone and this was closed with interrupted 2-0 Vicryl's reapproximating fascia.  We had to put some 3-0 Vicryl in the subcutaneous tissue to get good approximation and then the skin was closed with staples.  A sterile dry dressing was applied.  Condition: Stable  [name], MD [facility] Office: [phone] Pager: [place]

## 2018-05-26 NOTE — Progress Notes (Signed)
CSW contacted [facility] SW Yolande Jolly [phone] to confirm SNF placement. The family going to visit [place] and [employer] before making choice. SW Estrella Deeds will contact CSW once family has made decision.   Thurmond Butts, [place] Social Worker [phone]

## 2018-05-26 NOTE — Progress Notes (Signed)
[facility] Progress Note   Subjective:  Seen in room, s/p L BKA this morning. C/o mild pain. No other complaints - no CP/dyspnea.   Objective Vitals:   05/26/18 0924 05/26/18 1211 05/26/18 1225 05/26/18 1238  BP:  (!) 167/49 (!) 158/52 (!) 152/52  Pulse:  90 95 98  Resp:  17 20 15   Temp:  (!) 97.3 F (36.3 C) 97.7 F (36.5 C) 97.7 F (36.5 C)  TempSrc:    Oral  SpO2:  97% 96% 100%  Weight: 63 kg     Height: 5\' 6"  (1.676 m)      Physical Exam General: Elderly woman, NAD Heart: RRR Lungs: CTA Extremities: L BKA (stump wrapped), RLE no edema  Dialysis Access: AVF  Additional Objective Labs: Basic Metabolic Panel: Recent Labs  Lab 05/24/18 1021 05/24/18 1551 05/25/18 2130 05/26/18 1327  NA 137  --  132*  --   K 3.5  --  3.5  --   CL  --   --  92*  --   CO2  --   --  26  --   GLUCOSE 129*  --  316*  --   BUN  --   --  42*  --   CREATININE  --  4.88* 6.82* 3.70*  CALCIUM  --   --  8.4*  --   PHOS  --   --  4.3  --    Liver Function Tests: Recent Labs  Lab 05/25/18 2130  ALBUMIN 2.5*   CBC: Recent Labs  Lab 05/24/18 1551 05/25/18 2205 05/26/18 1327  WBC 7.3 7.1 6.6  HGB 12.6 9.1* 9.7*  HCT 40.2 29.6* 31.0*  MCV 98.5 98.7 99.4  PLT 156 147* 157   Blood Culture    Component Value Date/Time   SDES BONE LEFT TOE METATARSAL HEAD 04/28/2018 0806   SPECREQUEST PATIENT ON FOLLOWING ANCEF 04/28/2018 0806   CULT  04/28/2018 0806    FEW ESCHERICHIA COLI NO ANAEROBES ISOLATED Performed at [facility], [place] [address], [place], [place] [address]    REPTSTATUS 05/03/2018 FINAL 04/28/2018 0806   CBG: Recent Labs  Lab 05/26/18 0213 05/26/18 0610 05/26/18 0908 05/26/18 1211 05/26/18 1245  GLUCAP 108* 154* 110* 143* 165*   Medications: . sodium chloride Stopped (05/26/18 1203)  . sodium chloride    . sodium chloride    . sodium chloride 10 mL/hr at 05/26/18 0944  .  ceFAZolin (ANCEF) IV    . magnesium sulfate 1 - 4 g bolus  IVPB     . aspirin EC  325 mg Oral QPM  . calcitRIOL  0.25 mcg Oral Q T,Th,Sa-HD  . calcium acetate  667 mg Oral TID WC  . Chlorhexidine Gluconate Cloth  6 each Topical Q0600  . citalopram  20 mg Oral QPM  . cloNIDine  0.1 mg Oral BID  . clopidogrel  75 mg Oral Q breakfast  . [START ON 05/27/2018] docusate sodium  100 mg Oral Daily  . donepezil  5 mg Oral QHS  . feeding supplement (NEPRO CARB STEADY)  237 mL Oral BID BM  . heparin  5,000 Units Subcutaneous Q8H  . insulin aspart  0-9 Units Subcutaneous TID WC  . insulin glargine  10 Units Subcutaneous QHS  . isosorbide mononitrate  30 mg Oral QPM  . metoprolol succinate  12.5 mg Oral QPM  . multivitamin  1 tablet Oral QHS  . pantoprazole  40 mg Oral Daily  . sodium chloride  flush  3 mL Intravenous Q12H    Dialysis Orders: TTS [facility] 4.25 hours 400/800 EDW 63 (ususally gets to 64.5 - 65) 2K 2 Ca left upper AVF heparin 6000 Mircera 75 q 2 wks - last 1/23 no Fe calcitriol 0.25 Recent labs: hgb 10.8 30% sat ferritin 2190 iPTH 296 Ca/P ok  Assessment/Plan: 1. PVD: S/p L BKA 1/31. Per VVS. 2. ESRD: Continue HD per TTS schedule, next 2/1. No heparin. 3. Hypertension/volume: BP stable, no edema on exam. 4. Anemia: Hgb 9.7 - resume low dose ESA. 5. Metabolic bone disease: Ca/Phos ok. Continue low dose calcitriol and Phoslo as binder. 6. Nutrition: Alb low, continue Nepro supps. 7. DM - per primary  Glenda Penton, PA-C 05/26/2018, 3:11 PM  [facility] Pager: [phone]

## 2018-05-26 NOTE — Anesthesia Preprocedure Evaluation (Signed)
Anesthesia Evaluation  Patient identified by MRN, date of birth, ID band Patient awake    Reviewed: Allergy & Precautions, NPO status , Patient's Chart, lab work & pertinent test results, reviewed documented beta blocker date and time   History of Anesthesia Complications Negative for: history of anesthetic complications  Airway Mallampati: II  TM Distance: >3 FB Neck ROM: Full    Dental  (+) Dental Advisory Given, Missing, Edentulous Upper,    Pulmonary shortness of breath, sleep apnea ,    breath sounds clear to auscultation       Cardiovascular hypertension, Pt. on medications and Pt. on home beta blockers + CAD, + Peripheral Vascular Disease and +CHF  + dysrhythmias Atrial Fibrillation  Rhythm:Regular Rate:Normal  Echo 08/21/17: Study Conclusions  - Left ventricle: The cavity size was normal. There was moderate concentric hypertrophy. Systolic function was normal. The estimated ejection fraction was in the range of 60% to 65%. Wall motion was normal; there were no regional wall motion abnormalities. Features are consistent with a pseudonormal left ventricular filling pattern, with concomitant abnormal relaxation   and increased filling pressure (grade 2 diastolic dysfunction). Doppler parameters are consistent with elevated ventricular end-diastolic filling pressure. - Mitral valve: Calcified annulus. Mildly thickened leaflets .   There was moderate regurgitation. - Left atrium: The atrium was moderately dilated. - Right ventricle: The cavity size was mildly dilated. Wall   thickness was normal. Systolic function was normal. - Tricuspid valve: There was mild regurgitation. - Pulmonary arteries: Systolic pressure was moderately increased. PA peak pressure: 47 mm Hg (S). - Inferior vena cava: The vessel was dilated. The respirophasic diameter changes were blunted (< 50%), consistent with elevated central venous pressure. -  Pericardium, extracardiac: There was no pericardial effusion.   Neuro/Psych CVA, Residual Symptoms negative neurological ROS     GI/Hepatic Neg liver ROS, GERD  Medicated and Controlled,  Endo/Other  diabetes, Type 2, Insulin Dependent  Renal/GU ESRF and DialysisRenal diseaseHD last night     Musculoskeletal  (+) Arthritis ,   Abdominal   Peds  Hematology  (+) Blood dyscrasia (Warfarin), anemia ,   Anesthesia Other Findings H/o breast cancer  Reproductive/Obstetrics                             Anesthesia Physical Anesthesia Plan  ASA: IV  Anesthesia Plan: General   Post-op Pain Management:    Induction: Intravenous  PONV Risk Score and Plan: Ondansetron and Dexamethasone  Airway Management Planned: LMA  Additional Equipment: None  Intra-op Plan:   Post-operative Plan: Extubation in OR  Informed Consent: I have reviewed the patients History and Physical, chart, labs and discussed the procedure including the risks, benefits and alternatives for the proposed anesthesia with the patient or authorized representative who has indicated his/her understanding and acceptance.     Dental advisory given  Plan Discussed with: CRNA and Surgeon  Anesthesia Plan Comments:         Anesthesia Quick Evaluation

## 2018-05-26 NOTE — Progress Notes (Signed)
HD tx initiated via 15Gx2 w/o problem Pull/push/flush well w/o problem VSS Will continue to monitor while on HD tx 

## 2018-05-26 NOTE — Transfer of Care (Signed)
Immediate Anesthesia Transfer of Care Note  Patient: Glenda [name]  Procedure(s) Performed: AMPUTATION BELOW KNEE, LEFT (Left Leg Lower)  Patient Location: PACU  Anesthesia Type:General  Level of Consciousness: drowsy and patient cooperative  Airway & Oxygen Therapy: Patient Spontanous Breathing  Post-op Assessment: Report given to RN and Post -op Vital signs reviewed and stable  Post vital signs: Reviewed and stable  Last Vitals:  Vitals Value Taken Time  BP 167/49 05/26/2018 12:11 PM  Temp    Pulse 89 05/26/2018 12:12 PM  Resp 17 05/26/2018 12:12 PM  SpO2 97 % 05/26/2018 12:12 PM  Vitals shown include unvalidated device data.  Last Pain:  Vitals:   05/26/18 0810  TempSrc: Oral  PainSc:       Patients Stated Pain Goal: 2 (83/16/74 2552)  Complications: No apparent anesthesia complications

## 2018-05-26 NOTE — Progress Notes (Signed)
HD tx completed @ 0115 w/o problem UF goal met Blood rinsed back VSS Report called to [place], Therapist, sports

## 2018-05-26 NOTE — Anesthesia Procedure Notes (Signed)
Procedure Name: LMA Insertion Date/Time: 05/26/2018 10:41 AM Performed by: Lance Coon, CRNA Pre-anesthesia Checklist: Patient identified, Emergency Drugs available, Suction available, Patient being monitored and Timeout performed Patient Re-evaluated:Patient Re-evaluated prior to induction Oxygen Delivery Method: Circle system utilized Preoxygenation: Pre-oxygenation with 100% oxygen Induction Type: IV induction LMA: LMA inserted LMA Size: 4.0 Number of attempts: 1 Placement Confirmation: positive ETCO2 and breath sounds checked- equal and bilateral Tube secured with: Tape Dental Injury: Teeth and Oropharynx as per pre-operative assessment

## 2018-05-26 NOTE — Progress Notes (Addendum)
Physical medicine rehabilitation consult requested chart reviewed. Patient underwent left BKA today 05/26/2018. Will await physical and occupational therapy evaluations and follow-up with appropriate recommendations.Marland KitchenMarland KitchenSocial worker has documented possible need for skilled nursing facility will hold on consult at this time

## 2018-05-26 NOTE — Progress Notes (Signed)
[facility]  Subjective  - No complaints    Objective (!) 127/45 68 97.6 F (36.4 C) (Oral) 12 100%  Intake/Output Summary (Last 24 hours) at 05/26/2018 1012 Last data filed at 05/26/2018 0900 Gross per 24 hour  Intake 60 ml  Output 2008 ml  Net -1948 ml    Left groin c/d/i, no hematoma Non-healing left great toe amp Right DP palpable after AT atherectomy, dry gangrene to tip of right great toe  Laboratory Lab Results: Recent Labs    05/24/18 1551 05/25/18 2205  WBC 7.3 7.1  HGB 12.6 9.1*  HCT 40.2 29.6*  PLT 156 147*   BMET Recent Labs    05/24/18 1021 05/24/18 1551 05/25/18 2130  NA 137  --  132*  K 3.5  --  3.5  CL  --   --  92*  CO2  --   --  26  GLUCOSE 129*  --  316*  BUN  --   --  42*  CREATININE  --  4.88* 6.82*  CALCIUM  --   --  8.4*    COAG Lab Results  Component Value Date   INR 0.96 04/28/2018   INR 0.99 04/28/2018   INR 1.00 10/12/2017   No results found for: PTT  Assessment/Planning:  Doing well POD#2 s/p R AT atherectomy for CLI with tissue loss.  Now has palpable right DP pulse.  Plan for L BKA today given non-healing toe amputation.  Discussed extensively with family option of TMA vs BKA and they have opted for BKA.  They state she does stand and walk so will defer AKA.  [name] 05/26/2018 10:12 AM --

## 2018-05-27 LAB — BASIC METABOLIC PANEL
Anion gap: 14 (ref 5–15)
BUN: 21 mg/dL (ref 8–23)
CO2: 26 mmol/L (ref 22–32)
Calcium: 9.1 mg/dL (ref 8.9–10.3)
Chloride: 95 mmol/L — ABNORMAL LOW (ref 98–111)
Creatinine, Ser: 4.62 mg/dL — ABNORMAL HIGH (ref 0.44–1.00)
GFR calc Af Amer: 10 mL/min — ABNORMAL LOW (ref >60)
GFR calc non Af Amer: 8 mL/min — ABNORMAL LOW (ref >60)
GLUCOSE: 262 mg/dL — AB (ref 70–99)
Potassium: 4.6 mmol/L (ref 3.5–5.1)
Sodium: 135 mmol/L (ref 135–145)

## 2018-05-27 LAB — GLUCOSE, CAPILLARY
Glucose-Capillary: 210 mg/dL — ABNORMAL HIGH (ref 70–99)
Glucose-Capillary: 161 mg/dL — ABNORMAL HIGH (ref 70–99)
Glucose-Capillary: 93 mg/dL (ref 70–99)
Glucose-Capillary: 164 mg/dL — ABNORMAL HIGH (ref 70–99)

## 2018-05-27 LAB — CBC
HCT: 28.1 % — ABNORMAL LOW (ref 36.0–46.0)
Hemoglobin: 8.7 g/dL — ABNORMAL LOW (ref 12.0–15.0)
MCH: 30.9 pg (ref 26.0–34.0)
MCHC: 31 g/dL (ref 30.0–36.0)
MCV: 99.6 fL (ref 80.0–100.0)
Platelets: 143 10*3/uL — ABNORMAL LOW (ref 150–400)
RBC: 2.82 MIL/uL — ABNORMAL LOW (ref 3.87–5.11)
RDW: 18.1 % — ABNORMAL HIGH (ref 11.5–15.5)
WBC: 9.4 10*3/uL (ref 4.0–10.5)
nRBC: 0.3 % — ABNORMAL HIGH (ref 0.0–0.2)

## 2018-05-27 NOTE — Progress Notes (Signed)
Subjective: Interval History: none.. Comfortable.  Reports some incisional soreness but this is taken care of with medication  Objective: Vital signs in last 24 hours: Temp:  [97.3 F (36.3 C)-97.8 F (36.6 C)] 97.6 F (36.4 C) (02/01 0818) Pulse Rate:  [67-98] 67 (02/01 0818) Resp:  [11-20] 19 (02/01 0818) BP: (135-179)/(41-63) 135/41 (02/01 0818) SpO2:  [96 %-100 %] 100 % (02/01 0818) Weight:  [61.7 kg] 61.7 kg (02/01 0523)  Intake/Output from previous day: 01/31 0701 - 02/01 0700 In: 1123.5 [I.V.:673.5; IV Piggyback:450] Out: 200 [Blood:200] Intake/Output this shift: Total I/O In: 180 [P.O.:180] Out: -   Left BKA amputation dressing intact.  Palpable dorsalis pedis on the right with dry gangrene on the tip of her toe  Lab Results: Recent Labs    05/26/18 1327 05/27/18 0335  WBC 6.6 9.4  HGB 9.7* 8.7*  HCT 31.0* 28.1*  PLT 157 143*   BMET Recent Labs    05/25/18 2130 05/26/18 1327 05/27/18 0335  NA 132*  --  135  K 3.5  --  4.6  CL 92*  --  95*  CO2 26  --  26  GLUCOSE 316*  --  262*  BUN 42*  --  21  CREATININE 6.82* 3.70* 4.62*  CALCIUM 8.4*  --  9.1    Studies/Results: No results found. Anti-infectives: Anti-infectives (From admission, onward)   Start     Dose/Rate Route Frequency Ordered Stop   05/26/18 1830  ceFAZolin (ANCEF) IVPB 2g/100 mL premix     2 g 200 mL/hr over 30 Minutes Intravenous Every 8 hours 05/26/18 1306 05/27/18 0238   05/26/18 0715  ceFAZolin (ANCEF) IVPB 2g/100 mL premix     2 g 200 mL/hr over 30 Minutes Intravenous To ShortStay [facility] 05/25/18 0954 05/26/18 1044      Assessment/Plan: s/p Procedure(s): AMPUTATION BELOW KNEE, LEFT (Left) Stable overall.  Will plan to check amputation stump in a.m.   LOS: 3 days   Curt Jews 05/27/2018, 9:28 AM

## 2018-05-27 NOTE — Plan of Care (Signed)

## 2018-05-27 NOTE — Progress Notes (Signed)
Admit: 05/24/2018 LOS: 3  16F ESRD s/p L BKA  Subjective:  . No interval events, no c/o . For HD today   01/31 0701 - 02/01 0700 In: 1123.5 [I.V.:673.5; IV Piggyback:450] Out: 200 [Blood:200]  Filed Weights   05/26/18 0139 05/26/18 0924 05/27/18 0523  Weight: 63 kg 63 kg 61.7 kg    Scheduled Meds: . aspirin EC  325 mg Oral QPM  . calcitRIOL  0.25 mcg Oral Q T,Th,Sa-HD  . calcium acetate  667 mg Oral TID WC  . Chlorhexidine Gluconate Cloth  6 each Topical Q0600  . citalopram  20 mg Oral QPM  . cloNIDine  0.1 mg Oral BID  . clopidogrel  75 mg Oral Q breakfast  . [START ON 06/01/2018] darbepoetin (ARANESP) injection - DIALYSIS  60 mcg Intravenous Q Thu-HD  . docusate sodium  100 mg Oral Daily  . donepezil  5 mg Oral QHS  . feeding supplement (NEPRO CARB STEADY)  237 mL Oral BID BM  . heparin  5,000 Units Subcutaneous Q8H  . insulin aspart  0-9 Units Subcutaneous TID WC  . insulin glargine  10 Units Subcutaneous QHS  . isosorbide mononitrate  30 mg Oral QPM  . metoprolol succinate  12.5 mg Oral QPM  . multivitamin  1 tablet Oral QHS  . pantoprazole  40 mg Oral Daily  . sodium chloride flush  3 mL Intravenous Q12H   Continuous Infusions: . sodium chloride Stopped (05/26/18 1203)  . sodium chloride    . sodium chloride    . sodium chloride 10 mL/hr at 05/26/18 0944  . magnesium sulfate 1 - 4 g bolus IVPB     PRN Meds:.sodium chloride, sodium chloride, sodium chloride, acetaminophen, alteplase, alum & mag hydroxide-simeth, guaiFENesin-dextromethorphan, heparin, heparin, hydrALAZINE, HYDROcodone-acetaminophen, labetalol, lidocaine (PF), lidocaine-prilocaine, magnesium sulfate 1 - 4 g bolus IVPB, morphine injection, ondansetron (ZOFRAN) IV, pentafluoroprop-tetrafluoroeth, phenol, potassium chloride, sodium chloride flush  Current Labs: reviewed    Physical Exam:  Blood pressure (!) 135/41, pulse 67, temperature 97.6 F (36.4 C), temperature source Oral, resp. rate 19, height  5\' 6"  (1.676 m), weight 61.7 kg, SpO2 100 %. NAD RRR nl s1s2 CTAB Stump bandaging clean +B/T of AVF  Dialysis Orders: TTS [facility] 4.25 hours 400/800 EDW 63 (ususally gets to 64.5 - 65) 2K 2 Ca left upper AVF heparin 6000 Mircera 75 q 2 wks - last 1/23 no Fe calcitriol 0.25 Recent labs: hgb 10.8 30% sat ferritin 2190 iPTH 296 Ca/P ok  A/P 1. PVD: S/p L BKA 1/31. Per [facility]. 2. ESRD: Continue HD per TTS schedule, next 2/1. No heparin. 3. Hypertension/volume: BP stable, no edema on exam. 4. Anemia: Hgb 8.7 - resume low dose ESA. 5. Metabolic bone disease: Ca/Phos ok.Continue low dose calcitriol and Phoslo as binder. 6. Nutrition: Alb low, continue Nepro supps. 7. DM - per primary    Pearson Grippe MD 05/27/2018, 9:54 AM  Recent Labs  Lab 05/24/18 1021  05/25/18 2130 05/26/18 1327 05/27/18 0335  NA 137  --  132*  --  135  K 3.5  --  3.5  --  4.6  CL  --   --  92*  --  95*  CO2  --   --  26  --  26  GLUCOSE 129*  --  316*  --  262*  BUN  --   --  42*  --  21  CREATININE  --    < > 6.82* 3.70* 4.62*  CALCIUM  --   --  8.4*  --  9.1  PHOS  --   --  4.3  --   --    < > = values in this interval not displayed.   Recent Labs  Lab 05/25/18 2205 05/26/18 1327 05/27/18 0335  WBC 7.1 6.6 9.4  HGB 9.1* 9.7* 8.7*  HCT 29.6* 31.0* 28.1*  MCV 98.7 99.4 99.6  PLT 147* 157 143*

## 2018-05-27 NOTE — Evaluation (Signed)
Occupational Therapy Evaluation Patient Details Name: Glenda [name] MRN: [id number] DOB: 1934-04-20 Today's Date: 05/27/2018    History of Present Illness 85 y.o. female with ESRD on TTS HD at [facility] with hx HTN, PVD, DM, treated breast cancer admitted for bilateral LE angiogram with nonhealing left great toe amputation. Now s/p Left below-knee amputation on 05/26/2018.   Clinical Impression   Pt admitted with the above diagnoses and presents with below problem list. Pt will benefit from continued acute OT to address the below listed deficits and maximize independence with basic ADLs prior to d/c to venue below. At baseline, pt is mod I with most ADLs, some assistance to complete tub shower transfer. Pt lives with her adult children. Pt currently mod A for UB ADLs, mod-max +2 A with LB ADLs and functional transfers. Pt eager to work with therapy and feel she would be a good candidate for CIR.      Follow Up Recommendations  CIR    Equipment Recommendations  Other (comment)(defer to next venue)    Recommendations for Other Services Rehab consult     Precautions / Restrictions Precautions Precautions: Fall Restrictions Other Position/Activity Restrictions: L BKA      Mobility Bed Mobility Overal bed mobility: Needs Assistance Bed Mobility: Supine to Sit     Supine to sit: Mod assist;HOB elevated     General bed mobility comments: assist to powerup and pivot hips to full EOB position  Transfers Overall transfer level: Needs assistance Equipment used: Rolling walker (2 wheeled);None Transfers: Sit to/from [employer] Sit to Stand: Mod assist;+2 physical assistance;Max assist   Squat pivot transfers: Mod assist;Max assist;+2 physical assistance     General transfer comment: stood 2x from EOB using rw and +2 assist to powerup and stabilize. Stood <30 seconds each time. Ultimately completed squat-pivot transfer with +2 HHA (in lieu of rw) to transfer to  recliner.    Balance Overall balance assessment: Needs assistance Sitting-balance support: Bilateral upper extremity supported Sitting balance-Leahy Scale: Fair     Standing balance support: Bilateral upper extremity supported;During functional activity Standing balance-Leahy Scale: Zero Standing balance comment: rw and mod +2 assist to stand                           ADL either performed or assessed with clinical judgement   ADL Overall ADL's : Needs assistance/impaired Eating/Feeding: Set up;Sitting   Grooming: Minimal assistance;Sitting   Upper Body Bathing: Minimal assistance;Sitting   Lower Body Bathing: Moderate assistance;+2 for physical assistance;Sit to/from stand   Upper Body Dressing : Minimal assistance;Sitting   Lower Body Dressing: Moderate assistance;+2 for physical assistance;Sit to/from stand   Toilet Transfer: Moderate assistance;Maximal assistance;+2 for physical assistance;Squat-pivot;BSC   Toileting- Clothing Manipulation and Hygiene: Maximal assistance;Sit to/from stand;+2 for physical assistance         General ADL Comments: Pt completed bed mobility, stood 2x from EOB for <30 seconds each time. Then completed squat-pivot with  +2 HHA to recliner     Vision Baseline Vision/History: Wears glasses Wears Glasses: At all times       Perception     Praxis      Pertinent Vitals/Pain Pain Assessment: Faces Faces Pain Scale: Hurts little more Pain Location: LLE, some grimacing with mobility Pain Descriptors / Indicators: Grimacing Pain Intervention(s): Monitored during session;Repositioned;Limited activity within patient's tolerance     Hand Dominance Right   Extremity/Trunk Assessment Upper Extremity Assessment Upper Extremity Assessment: Generalized weakness  Lower Extremity Assessment Lower Extremity Assessment: Defer to PT evaluation   Cervical / Trunk Assessment Cervical / Trunk Assessment: Kyphotic   Communication  Communication Communication: No difficulties   Cognition Arousal/Alertness: Awake/alert Behavior During Therapy: WFL for tasks assessed/performed Overall Cognitive Status: History of cognitive impairments - at baseline                                     General Comments  Son present during session. Pt is a part of the [facility] program.     Exercises     Shoulder Instructions      Home Living Family/patient expects to be discharged to:: Private residence Living Arrangements: Children Available Help at Discharge: Family;Available 24 hours/day;Friend(s) Type of Home: House Home Access: Ramped entrance     Home Layout: One level     Bathroom Shower/Tub: Teacher, early years/pre: Standard     Home Equipment: Environmental consultant - 2 wheels;Wheelchair - [employer];Tub bench;Toilet riser;Walker - 4 wheels          Prior Functioning/Environment Level of Independence: Needs assistance  Gait / Transfers Assistance Needed: typically RW in home, WC out of house but reports recent increased use of w/c ADL's / Homemaking Assistance Needed: aide vs family assists with ADL's            OT Problem List: Decreased strength;Decreased activity tolerance;Impaired balance (sitting and/or standing);Decreased knowledge of use of DME or AE;Decreased knowledge of precautions;Pain      OT Treatment/Interventions: Self-care/ADL training;Therapeutic exercise;DME and/or AE instruction;Therapeutic activities;Patient/family education;Balance training    OT Goals(Current goals can be found in the care plan section) Acute Rehab OT Goals Patient Stated Goal: move better OT Goal Formulation: With patient/family Time For Goal Achievement: 06/10/18 Potential to Achieve Goals: Good ADL Goals Pt Will Perform Upper Body Bathing: (P) with set-up;sitting Pt Will Perform Lower Body Bathing: (P) with min guard assist;sit to/from stand Pt Will Perform Upper Body Dressing: (P) with  set-up;sitting Pt Will Perform Lower Body Dressing: (P) with min guard assist;sit to/from stand Pt Will Transfer to Toilet: (P) with min guard assist;stand pivot transfer;bedside commode Pt Will Perform Toileting - Clothing Manipulation and hygiene: (P) with min guard assist;sit to/from stand Additional ADL Goal #1: (P) Pt will complete bed mobility at min guard level to prepare for OOB ADLs.  OT Frequency: Min 3X/week   Barriers to D/C:            Co-evaluation PT/OT/SLP Co-Evaluation/Treatment: Yes Reason for Co-Treatment: For patient/therapist safety;To address functional/ADL transfers   OT goals addressed during session: ADL's and self-care;Strengthening/ROM;Proper use of Adaptive equipment and DME      AM-PAC OT "6 Clicks" Daily Activity     Outcome Measure Help from another person eating meals?: None Help from another person taking care of personal grooming?: A Little Help from another person toileting, which includes using toliet, bedpan, or urinal?: A Lot Help from another person bathing (including washing, rinsing, drying)?: A Lot Help from another person to put on and taking off regular upper body clothing?: A Lot Help from another person to put on and taking off regular lower body clothing?: A Lot 6 Click Score: 15   End of Session Equipment Utilized During Treatment: Gait belt;Rolling walker  Activity Tolerance: Patient tolerated treatment well;Other (comment)(reporting some nausea at end of session) Patient left: in chair;with call bell/phone within reach;with chair alarm set;with family/visitor present  OT Visit Diagnosis: Unsteadiness on feet (R26.81);Muscle weakness (generalized) (M62.81);Pain;Other symptoms and signs involving cognitive function;Other abnormalities of gait and mobility (R26.89)                Time: 1030-1053 OT Time Calculation (min): 23 min Charges:  OT General Charges $OT Visit: 1 Visit OT Evaluation $OT Eval Low Complexity: [place], OT Acute Rehabilitation Services Pager: [phone] Office: [phone]   Hortencia Pilar 05/27/2018, 1:56 PM

## 2018-05-27 NOTE — Evaluation (Addendum)
Physical Therapy Evaluation Patient Details Name: Glenda [name] MRN: [id number] DOB: 11-22-1933 Today's Date: 05/27/2018   History of Present Illness  85 y.o. female with ESRD on TTS HD at [facility] with hx HTN, PVD, DM, treated breast cancer admitted for bilateral LE angiogram with nonhealing left great toe amputation. Now s/p Left below-knee amputation on 05/26/2018.  Clinical Impression  Pt admitted with above diagnosis. Pt currently with functional limitations due to the deficits listed below (see PT Problem List). On PT evaluation, pt presenting with decreased functional mobility secondary to balance impairments and weakness. Performing stand pivot transfers with two person moderate assistance. Recommending CIR to address transfer training, wheelchair mobility, amputee education, and family training. Given excellent family support and pt motivation, suspect she will do well. Pt will benefit from skilled PT to increase their independence and safety with mobility to allow discharge to the venue listed below.       Follow Up Recommendations CIR;Supervision/Assistance - 24 hour    Equipment Recommendations  Other (comment)(tbd)    Recommendations for Other Services Rehab consult     Precautions / Restrictions Precautions Precautions: Fall Restrictions Other Position/Activity Restrictions: L BKA      Mobility  Bed Mobility Overal bed mobility: Needs Assistance Bed Mobility: Supine to Sit     Supine to sit: HOB elevated;Mod assist     General bed mobility comments: assist to powerup and pivot hips to full EOB position  Transfers Overall transfer level: Needs assistance Equipment used: Rolling walker (2 wheeled);None Transfers: Sit to/from [employer] Sit to Stand: Mod assist;+2 physical assistance;Max assist   Squat pivot transfers: Mod assist;Max assist;+2 physical assistance     General transfer comment: stood 2x from EOB using rw and +2 assist to powerup  and stabilize. Stood <30 seconds each time with increased trunk/hip/knee flexion. Ultimately completed squat-pivot transfer with +2 HHA (in lieu of rw) to transfer to recliner.  Ambulation/Gait                Stairs            Wheelchair Mobility    Modified Rankin (Stroke Patients Only)       Balance Overall balance assessment: Needs assistance Sitting-balance support: Bilateral upper extremity supported Sitting balance-Leahy Scale: Fair     Standing balance support: Bilateral upper extremity supported;During functional activity Standing balance-Leahy Scale: Zero Standing balance comment: rw and mod +2 assist to stand                             Pertinent Vitals/Pain Pain Assessment: Faces Faces Pain Scale: Hurts little more Pain Location: LLE, some grimacing with mobility Pain Descriptors / Indicators: Grimacing Pain Intervention(s): Monitored during session    Home Living Family/patient expects to be discharged to:: Private residence Living Arrangements: Children Available Help at Discharge: Family;Available 24 hours/day;Friend(s) Type of Home: House Home Access: Ramped entrance     Home Layout: One level Home Equipment: [place] - 2 wheels;Wheelchair - [employer];Tub bench;Toilet riser;Walker - 4 wheels      Prior Function Level of Independence: Needs assistance   Gait / Transfers Assistance Needed: typically RW in home, WC out of house but reports recent increased use of w/c  ADL's / Homemaking Assistance Needed: aide vs family assists with ADL's        Hand Dominance   Dominant Hand: Right    Extremity/Trunk Assessment   Upper Extremity Assessment Upper Extremity Assessment: Defer to  OT evaluation    Lower Extremity Assessment Lower Extremity Assessment: LLE deficits/detail LLE Deficits / Details: s/p BKA. At least anti gravity strength    Cervical / Trunk Assessment Cervical / Trunk Assessment: Kyphotic   Communication   Communication: No difficulties  Cognition Arousal/Alertness: Awake/alert Behavior During Therapy: WFL for tasks assessed/performed Overall Cognitive Status: History of cognitive impairments - at baseline                                        General Comments General comments (skin integrity, edema, etc.): Son present during session.     Exercises Amputee Exercises Hip Flexion/Marching: Left;Seated;5 reps Knee Extension: 5 reps;Left;Seated   Assessment/Plan    PT Assessment Patient needs continued PT services  PT Problem List Decreased strength;Decreased activity tolerance;Decreased balance;Decreased mobility;Decreased cognition       PT Treatment Interventions DME instruction;Gait training;Functional mobility training;Therapeutic activities;Therapeutic exercise;Balance training;Neuromuscular re-education;Patient/family education;Wheelchair mobility training    PT Goals (Current goals can be found in the Care Plan section)  Acute Rehab PT Goals Patient Stated Goal: move better PT Goal Formulation: With patient Time For Goal Achievement: 06/10/18 Potential to Achieve Goals: Good    Frequency Min 3X/week   Barriers to discharge        Co-evaluation PT/OT/SLP Co-Evaluation/Treatment: Yes Reason for Co-Treatment: For patient/therapist safety;To address functional/ADL transfers PT goals addressed during session: Mobility/safety with mobility OT goals addressed during session: ADL's and self-care;Strengthening/ROM;Proper use of Adaptive equipment and DME       AM-PAC PT "6 Clicks" Mobility  Outcome Measure Help needed turning from your back to your side while in a flat bed without using bedrails?: A Little Help needed moving from lying on your back to sitting on the side of a flat bed without using bedrails?: A Lot Help needed moving to and from a bed to a chair (including a wheelchair)?: A Lot Help needed standing up from a chair using  your arms (e.g., wheelchair or bedside chair)?: A Lot Help needed to walk in hospital room?: Total Help needed climbing 3-5 steps with a railing? : Total 6 Click Score: 11    End of Session Equipment Utilized During Treatment: Gait belt Activity Tolerance: Patient tolerated treatment well Patient left: in chair;with call bell/phone within reach;with chair alarm set Nurse Communication: Mobility status PT Visit Diagnosis: Unsteadiness on feet (R26.81);Difficulty in walking, not elsewhere classified (R26.2)    Time: 1030-1053 PT Time Calculation (min) (ACUTE ONLY): 23 min   Charges:   PT Evaluation $PT Eval Moderate Complexity: 1 Mod        Glenda [name], [place], DPT Acute Rehabilitation Services Pager [phone] Office [phone]   Willy Eddy 05/27/2018, 2:15 PM

## 2018-05-27 NOTE — Anesthesia Postprocedure Evaluation (Signed)
Anesthesia Post Note  Patient: Symphonie Schneiderman  Procedure(s) Performed: AMPUTATION BELOW KNEE, LEFT (Left Leg Lower)     Patient location during evaluation: PACU Anesthesia Type: General Level of consciousness: awake and alert Pain management: pain level controlled Vital Signs Assessment: post-procedure vital signs reviewed and stable Respiratory status: spontaneous breathing, nonlabored ventilation, respiratory function stable and patient connected to nasal cannula oxygen Cardiovascular status: blood pressure returned to baseline and stable Postop Assessment: no apparent nausea or vomiting Anesthetic complications: no    Last Vitals:  Vitals:   05/27/18 0818 05/27/18 1300  BP: (!) 135/41 (!) 131/40  Pulse: 67 72  Resp: 19 12  Temp: 36.4 C (!) 36.4 C  SpO2: 100% 99%    Last Pain:  Vitals:   05/27/18 1300  TempSrc: Oral  PainSc:                  Shakura Cowing

## 2018-05-28 LAB — CBC
HEMATOCRIT: 26.8 % — AB (ref 36.0–46.0)
Hemoglobin: 8 g/dL — ABNORMAL LOW (ref 12.0–15.0)
MCH: 30.1 pg (ref 26.0–34.0)
MCHC: 29.9 g/dL — ABNORMAL LOW (ref 30.0–36.0)
MCV: 100.8 fL — ABNORMAL HIGH (ref 80.0–100.0)
NRBC: 0.2 % (ref 0.0–0.2)
Platelets: 140 10*3/uL — ABNORMAL LOW (ref 150–400)
RBC: 2.66 MIL/uL — ABNORMAL LOW (ref 3.87–5.11)
RDW: 18.2 % — ABNORMAL HIGH (ref 11.5–15.5)
WBC: 8 10*3/uL (ref 4.0–10.5)

## 2018-05-28 LAB — BASIC METABOLIC PANEL
Anion gap: 17 — ABNORMAL HIGH (ref 5–15)
BUN: 32 mg/dL — ABNORMAL HIGH (ref 8–23)
CO2: 26 mmol/L (ref 22–32)
Calcium: 8.8 mg/dL — ABNORMAL LOW (ref 8.9–10.3)
Chloride: 94 mmol/L — ABNORMAL LOW (ref 98–111)
Creatinine, Ser: 6.21 mg/dL — ABNORMAL HIGH (ref 0.44–1.00)
GFR calc Af Amer: 7 mL/min — ABNORMAL LOW (ref >60)
GFR calc non Af Amer: 6 mL/min — ABNORMAL LOW (ref >60)
Glucose, Bld: 95 mg/dL (ref 70–99)
Potassium: 4.2 mmol/L (ref 3.5–5.1)
Sodium: 137 mmol/L (ref 135–145)

## 2018-05-28 LAB — GLUCOSE, CAPILLARY
Glucose-Capillary: 86 mg/dL (ref 70–99)
Glucose-Capillary: 110 mg/dL — ABNORMAL HIGH (ref 70–99)
Glucose-Capillary: 91 mg/dL (ref 70–99)

## 2018-05-28 MED ORDER — HEPARIN SODIUM (PORCINE) 1000 UNIT/ML IJ SOLN
INTRAMUSCULAR | Status: AC
  Filled 2018-05-28: qty 2

## 2018-05-28 MED ORDER — ONDANSETRON HCL 4 MG/2ML IJ SOLN
INTRAMUSCULAR | Status: AC
  Filled 2018-05-28: qty 2

## 2018-05-28 NOTE — NC FL2 (Signed)
[place] MEDICAID FL2 LEVEL OF CARE SCREENING TOOL     IDENTIFICATION  Patient Name: Glenda [name] Birthdate: [birth year] Sex: female Admission Date (Current Location): 05/24/2018  [facility] and [place] Number:  Herbalist and Address:  [facility], [place] [address] [place], [place] [address]      Provider Number: [id number]  Attending Physician Name and Address:  [name], MD  Relative Name and Phone Number:  Mancillas,Virgie  [phone]    Current Level of Care: SNF Recommended Level of Care: [place] Prior Approval Number:    Date Approved/Denied:   PASRR Number:  [id number]   Discharge Plan: SNF    Current Diagnoses: Patient Active Problem List   Diagnosis Date Noted  . PAD (peripheral artery disease) ([place]) 04/28/2018  . PVD (peripheral vascular disease) ([place]) 04/21/2018  . DM (diabetes mellitus), type 2, uncontrolled, with renal complications ([place]) 80/32/1224  . PAF (paroxysmal atrial fibrillation) ([place]) 08/24/2017  . Acute diastolic (congestive) heart failure ([place]) 08/20/2017  . Acute renal failure superimposed on chronic kidney disease ([place])   . Acute respiratory failure with hypoxia ([place])   . Hypokalemia   . Chronic kidney disease (CKD), stage IV (severe) ([place]) 01/03/2017  . CHF (congestive heart failure) ([place]) 01/02/2017  . Blood pressure alteration 09/16/2016  . Somnolence, daytime 12/08/2015  . Cerebral thrombosis with cerebral infarction 09/07/2015  . Lacunar infarct, acute ([place])   . Nausea & vomiting 09/05/2015  . Type II diabetes mellitus with neurological manifestations, uncontrolled ([place]) 09/03/2015  . Bilateral lower extremity edema 09/03/2015  . Essential hypertension   . Diabetes mellitus type 2 in nonobese (HCC)   . History of breast cancer   . AKI (acute kidney injury) ([place])   . Acute blood loss anemia   . Hemiparesis ([place])   . Gait disturbance, post-stroke   . Dysphagia,  post-stroke   . Acute ischemic stroke/right cerebellar 08/24/2015  . Breast cancer of upper-outer quadrant of right female breast ([place]) 04/30/2015  . CVA (cerebral infarction) 07/05/2014  . Hyperlipidemia 07/05/2014  . Mild mitral regurgitation by prior echocardiogram 12/20/2011  . Acute on chronic diastolic CHF (congestive heart failure) ([place]) 11/25/2010  . Diabetes mellitus type 1, uncontrolled, insulin dependent ([place]) 02/26/2009  . OBESITY 02/26/2009  . Uncontrolled hypertension 02/26/2009  . CAD (coronary artery disease) 02/26/2009  . GASTROESOPHAGEAL REFLUX DISEASE 02/26/2009    Orientation RESPIRATION BLADDER Height & Weight     Self, Time, Situation, Place  Normal Continent Weight: 138 lb 3.7 oz (62.7 kg) Height:  5\' 6"  (167.6 cm)  BEHAVIORAL SYMPTOMS/MOOD NEUROLOGICAL BOWEL NUTRITION STATUS      Continent Diet(please see discharge summary)  AMBULATORY STATUS COMMUNICATION OF NEEDS Skin   Limited Assist Verbally Surgical wounds(incision(closed) toe;left open wound with Luanna Salk cdi)                       Personal Care Assistance Level of Assistance  Bathing, Feeding, Dressing Bathing Assistance: Limited assistance Feeding assistance: Independent Dressing Assistance: Limited assistance     Functional Limitations Info  Hearing, Speech, Sight Sight Info: Adequate Hearing Info: Adequate Speech Info: Adequate    SPECIAL CARE FACTORS FREQUENCY  PT (By licensed PT), OT (By licensed OT)     PT Frequency: 3x per week OT Frequency: 3x per wek            Contractures Contractures Info: Not present    Additional Factors Info  Code Status, Allergies, Insulin  Sliding Scale Code Status Info: FULL Allergies Info: NKA   Insulin Sliding Scale Info: insulin aspart (novoLOG) injection 0-9 Units 3x daily with meals        Current Medications (05/28/2018):  This is the current hospital active medication list Current Facility-Administered Medications  Medication Dose  Route Frequency Provider Last Rate Last Dose  . 0.9 %  sodium chloride infusion  250 mL Intravenous PRN Ulyses Amor, PA-C   Stopped at 05/26/18 1203  . 0.9 %  sodium chloride infusion  100 mL Intravenous PRN Laurence Slate M, PA-C      . 0.9 %  sodium chloride infusion  100 mL Intravenous PRN Laurence Slate M, PA-C      . 0.9 %  sodium chloride infusion   Intravenous Continuous Ulyses Amor, PA-C 10 mL/hr at 05/26/18 0944    . acetaminophen (TYLENOL) tablet 650 mg  650 mg Oral Q4H PRN Ulyses Amor, PA-C   650 mg at 05/28/18 8182  . alteplase (CATHFLO ACTIVASE) injection 2 mg  2 mg Intracatheter Once PRN Ulyses Amor, PA-C      . alum & mag hydroxide-simeth (MAALOX/MYLANTA) 200-200-20 MG/5ML suspension 30 mL  30 mL Oral Q4H PRN Ulyses Amor, PA-C   30 mL at 05/25/18 1940  . aspirin EC tablet 325 mg  325 mg Oral QPM Laurence Slate M, PA-C   325 mg at 05/27/18 1719  . calcitRIOL (ROCALTROL) capsule 0.25 mcg  0.25 mcg Oral Q T,Th,Sa-HD Laurence Slate M, PA-C   0.25 mcg at 05/27/18 1054  . calcium acetate (PHOSLO) capsule 667 mg  667 mg Oral TID WC Ulyses Amor, PA-C   667 mg at 05/27/18 1719  . Chlorhexidine Gluconate Cloth 2 % PADS 6 each  6 each Topical Q0600 Ulyses Amor, PA-C   6 each at 05/26/18 0800  . citalopram (CELEXA) tablet 20 mg  20 mg Oral QPM Laurence Slate M, PA-C   20 mg at 05/27/18 1719  . cloNIDine (CATAPRES) tablet 0.1 mg  0.1 mg Oral BID Laurence Slate M, PA-C   0.1 mg at 05/27/18 2309  . clopidogrel (PLAVIX) tablet 75 mg  75 mg Oral Q breakfast Laurence Slate M, PA-C   75 mg at 05/27/18 9937  . [START ON 06/01/2018] Darbepoetin Alfa (ARANESP) injection 60 mcg  60 mcg Intravenous Q Thu-HD Lore, Melissa A, RPH      . docusate sodium (COLACE) capsule 100 mg  100 mg Oral Daily Laurence Slate M, PA-C   100 mg at 05/27/18 1055  . donepezil (ARICEPT) tablet 5 mg  5 mg Oral QHS Laurence Slate M, PA-C   5 mg at 05/27/18 2121  . feeding supplement (NEPRO CARB STEADY) liquid 237 mL   237 mL Oral BID BM Collins, Emma M, PA-C      . guaiFENesin-dextromethorphan (ROBITUSSIN DM) 100-10 MG/5ML syrup 15 mL  15 mL Oral Q4H PRN Laurence Slate M, PA-C      . heparin 1000 UNIT/ML injection           . heparin injection 1,000 Units  1,000 Units Dialysis PRN Ulyses Amor, PA-C      . heparin injection 1,300 Units  20 Units/kg Dialysis PRN Ulyses Amor, PA-C      . heparin injection 5,000 Units  5,000 Units Subcutaneous Q8H Laurence Slate M, PA-C   5,000 Units at 05/27/18 2121  . hydrALAZINE (APRESOLINE) injection 5 mg  5 mg Intravenous Q20 Min PRN Theda Sers,  Susette Racer, PA-C      . HYDROcodone-acetaminophen (NORCO/VICODIN) 5-325 MG per tablet 1-2 tablet  1-2 tablet Oral Q4H PRN Ulyses Amor, PA-C   2 tablet at 05/28/18 0205  . insulin aspart (novoLOG) injection 0-9 Units  0-9 Units Subcutaneous TID WC Ulyses Amor, PA-C   2 Units at 05/27/18 1248  . insulin glargine (LANTUS) injection 10 Units  10 Units Subcutaneous QHS Ulyses Amor, [place]   10 Units at 05/27/18 2121  . isosorbide mononitrate (IMDUR) 24 hr tablet 30 mg  30 mg Oral QPM Laurence Slate M, PA-C   30 mg at 05/27/18 1718  . labetalol (NORMODYNE,TRANDATE) injection 10 mg  10 mg Intravenous Q10 min PRN Ulyses Amor, PA-C   10 mg at 05/24/18 1350  . lidocaine (PF) (XYLOCAINE) 1 % injection 5 mL  5 mL Intradermal PRN Laurence Slate M, PA-C      . lidocaine-prilocaine (EMLA) cream 1 application  1 application Topical PRN Laurence Slate M, PA-C      . magnesium sulfate IVPB 2 g 50 mL  2 g Intravenous Daily PRN Laurence Slate M, PA-C      . metoprolol succinate (TOPROL-XL) 24 hr tablet 12.5 mg  12.5 mg Oral QPM Laurence Slate M, PA-C   12.5 mg at 05/27/18 1718  . morphine 2 MG/ML injection 2 mg  2 mg Intravenous Q4H PRN Laurence Slate M, PA-C   2 mg at 05/28/18 0205  . multivitamin (RENA-VIT) tablet 1 tablet  1 tablet Oral QHS Ulyses Amor, PA-C   1 tablet at 05/27/18 2121  . ondansetron (ZOFRAN) injection 4 mg  4 mg Intravenous  Q6H PRN Ulyses Amor, PA-C   4 mg at 05/28/18 0272  . pantoprazole (PROTONIX) EC tablet 40 mg  40 mg Oral Daily Laurence Slate M, PA-C   40 mg at 05/27/18 1054  . pentafluoroprop-tetrafluoroeth (GEBAUERS) aerosol 1 application  1 application Topical PRN Laurence Slate M, PA-C      . phenol (CHLORASEPTIC) mouth spray 1 spray  1 spray Mouth/Throat PRN Laurence Slate M, PA-C      . potassium chloride SA (K-DUR,KLOR-CON) CR tablet 20-40 mEq  20-40 mEq Oral Daily PRN Laurence Slate M, PA-C      . sodium chloride flush (NS) 0.9 % injection 3 mL  3 mL Intravenous Q12H Laurence Slate M, PA-C   3 mL at 05/27/18 2122  . sodium chloride flush (NS) 0.9 % injection 3 mL  3 mL Intravenous PRN Ulyses Amor, PA-C         Discharge Medications: Please see discharge summary for a list of discharge medications.  Relevant Imaging Results:  Relevant Lab Results:   Additional Information SSN# [id number]  Vinie Sill, LCSWA

## 2018-05-28 NOTE — Progress Notes (Signed)
Subjective: Interval History: none.. Currently on hemodialysis.  Diaphoretic.  Felt to be secondary to low blood sugar  Objective: Vital signs in last 24 hours: Temp:  [97.5 F (36.4 C)-98.2 F (36.8 C)] 98.2 F (36.8 C) (02/02 0530) Pulse Rate:  [69-80] 80 (02/02 0700) Resp:  [12-20] 16 (02/02 0700) BP: (93-138)/(40-60) 129/55 (02/02 0700) SpO2:  [91 %-100 %] 100 % (02/02 0530) Weight:  [62.7 kg] 62.7 kg (02/02 0530)  Intake/Output from previous day: 02/01 0701 - 02/02 0700 In: 360 [P.O.:360] Out: -  Intake/Output this shift: No intake/output data recorded.  Physical exam: Dressing dry and intact on amputation  Lab Results: Recent Labs    05/27/18 0335 05/28/18 0213  WBC 9.4 8.0  HGB 8.7* 8.0*  HCT 28.1* 26.8*  PLT 143* 140*   BMET Recent Labs    05/27/18 0335 05/28/18 0213  NA 135 137  K 4.6 4.2  CL 95* 94*  CO2 26 26  GLUCOSE 262* 95  BUN 21 32*  CREATININE 4.62* 6.21*  CALCIUM 9.1 8.8*    Studies/Results: No results found. Anti-infectives: Anti-infectives (From admission, onward)   Start     Dose/Rate Route Frequency Ordered Stop   05/26/18 1830  ceFAZolin (ANCEF) IVPB 2g/100 mL premix     2 g 200 mL/hr over 30 Minutes Intravenous Every 8 hours 05/26/18 1306 05/27/18 0238   05/26/18 0715  ceFAZolin (ANCEF) IVPB 2g/100 mL premix     2 g 200 mL/hr over 30 Minutes Intravenous To [facility] 05/25/18 0954 05/26/18 1044      Assessment/Plan: s/p Procedure(s): AMPUTATION BELOW KNEE, LEFT (Left) Stable postop day 2.  Will not treat down dressing today on dialysis with her current diaphoresis.  For wound check tomorrow   LOS: 4 days   Glenda [name] 05/28/2018, 10:03 AM

## 2018-05-28 NOTE — Procedures (Signed)
I was present at this dialysis session. I have reviewed the session itself and made appropriate changes.   UF goal 2L. 2K bath.  Tol Tx well.  BP stable.  Hb drifint down, likely reactive from surgery. On ESA, CTM.   Filed Weights   05/26/18 0924 05/27/18 0523 05/28/18 0530  Weight: 63 kg 61.7 kg 62.7 kg    Recent Labs  Lab 05/25/18 2130  05/28/18 0213  NA 132*   < > 137  K 3.5   < > 4.2  CL 92*   < > 94*  CO2 26   < > 26  GLUCOSE 316*   < > 95  BUN 42*   < > 32*  CREATININE 6.82*   < > 6.21*  CALCIUM 8.4*   < > 8.8*  PHOS 4.3  --   --    < > = values in this interval not displayed.    Recent Labs  Lab 05/26/18 1327 05/27/18 0335 05/28/18 0213  WBC 6.6 9.4 8.0  HGB 9.7* 8.7* 8.0*  HCT 31.0* 28.1* 26.8*  MCV 99.4 99.6 100.8*  PLT 157 143* 140*    Scheduled Meds: . aspirin EC  325 mg Oral QPM  . calcitRIOL  0.25 mcg Oral Q T,Th,Sa-HD  . calcium acetate  667 mg Oral TID WC  . Chlorhexidine Gluconate Cloth  6 each Topical Q0600  . citalopram  20 mg Oral QPM  . cloNIDine  0.1 mg Oral BID  . clopidogrel  75 mg Oral Q breakfast  . [START ON 06/01/2018] darbepoetin (ARANESP) injection - DIALYSIS  60 mcg Intravenous Q Thu-HD  . docusate sodium  100 mg Oral Daily  . donepezil  5 mg Oral QHS  . feeding supplement (NEPRO CARB STEADY)  237 mL Oral BID BM  . heparin      . heparin  5,000 Units Subcutaneous Q8H  . insulin aspart  0-9 Units Subcutaneous TID WC  . insulin glargine  10 Units Subcutaneous QHS  . isosorbide mononitrate  30 mg Oral QPM  . metoprolol succinate  12.5 mg Oral QPM  . multivitamin  1 tablet Oral QHS  . pantoprazole  40 mg Oral Daily  . sodium chloride flush  3 mL Intravenous Q12H   Continuous Infusions: . sodium chloride Stopped (05/26/18 1203)  . sodium chloride    . sodium chloride    . sodium chloride 10 mL/hr at 05/26/18 0944  . magnesium sulfate 1 - 4 g bolus IVPB     PRN Meds:.sodium chloride, sodium chloride, sodium chloride,  acetaminophen, alteplase, alum & mag hydroxide-simeth, guaiFENesin-dextromethorphan, heparin, heparin, hydrALAZINE, HYDROcodone-acetaminophen, labetalol, lidocaine (PF), lidocaine-prilocaine, magnesium sulfate 1 - 4 g bolus IVPB, morphine injection, ondansetron (ZOFRAN) IV, pentafluoroprop-tetrafluoroeth, phenol, potassium chloride, sodium chloride flush   Pearson Grippe  MD 05/28/2018, 7:26 AM

## 2018-05-29 ENCOUNTER — Encounter (HOSPITAL_COMMUNITY): Payer: Self-pay | Admitting: Vascular Surgery

## 2018-05-29 LAB — CBC
HCT: 28.6 % — ABNORMAL LOW (ref 36.0–46.0)
HEMOGLOBIN: 8.5 g/dL — AB (ref 12.0–15.0)
MCH: 30.1 pg (ref 26.0–34.0)
MCHC: 29.7 g/dL — ABNORMAL LOW (ref 30.0–36.0)
MCV: 101.4 fL — ABNORMAL HIGH (ref 80.0–100.0)
Platelets: 152 10*3/uL (ref 150–400)
RBC: 2.82 MIL/uL — ABNORMAL LOW (ref 3.87–5.11)
RDW: 18.2 % — ABNORMAL HIGH (ref 11.5–15.5)
WBC: 9.5 10*3/uL (ref 4.0–10.5)
nRBC: 0.2 % (ref 0.0–0.2)

## 2018-05-29 LAB — BASIC METABOLIC PANEL
Anion gap: 15 (ref 5–15)
BUN: 23 mg/dL (ref 8–23)
CO2: 25 mmol/L (ref 22–32)
Calcium: 8.4 mg/dL — ABNORMAL LOW (ref 8.9–10.3)
Chloride: 99 mmol/L (ref 98–111)
Creatinine, Ser: 4.07 mg/dL — ABNORMAL HIGH (ref 0.44–1.00)
GFR calc Af Amer: 11 mL/min — ABNORMAL LOW (ref >60)
GFR calc non Af Amer: 10 mL/min — ABNORMAL LOW (ref >60)
Glucose, Bld: 133 mg/dL — ABNORMAL HIGH (ref 70–99)
Potassium: 3.8 mmol/L (ref 3.5–5.1)
Sodium: 139 mmol/L (ref 135–145)

## 2018-05-29 LAB — GLUCOSE, CAPILLARY
Glucose-Capillary: 17 mg/dL — CL (ref 70–99)
Glucose-Capillary: 23 mg/dL — CL (ref 70–99)
Glucose-Capillary: 141 mg/dL — ABNORMAL HIGH (ref 70–99)
Glucose-Capillary: 116 mg/dL — ABNORMAL HIGH (ref 70–99)
Glucose-Capillary: 76 mg/dL (ref 70–99)
Glucose-Capillary: 164 mg/dL — ABNORMAL HIGH (ref 70–99)
Glucose-Capillary: 140 mg/dL — ABNORMAL HIGH (ref 70–99)

## 2018-05-29 MED ORDER — INSULIN GLARGINE 100 UNIT/ML ~~LOC~~ SOLN
4.0000 [IU] | Freq: Every day | SUBCUTANEOUS | Status: DC
  Administered 2018-05-29: 4 [IU] via SUBCUTANEOUS
  Filled 2018-05-29: qty 0.04

## 2018-05-29 MED ORDER — INSULIN ASPART 100 UNIT/ML ~~LOC~~ SOLN: 0.0000 [IU] | Freq: Three times a day (TID) | SUBCUTANEOUS | Status: DC

## 2018-05-29 MED ORDER — FLEET ENEMA 7-19 GM/118ML RE ENEM
1.0000 | ENEMA | Freq: Once | RECTAL | Status: DC
  Filled 2018-05-29: qty 1

## 2018-05-29 MED ORDER — DEXTROSE 50 % IV SOLN
1.0000 | Freq: Once | INTRAVENOUS | Status: AC
  Administered 2018-05-29: 50 mL via INTRAVENOUS
  Filled 2018-05-29: qty 50

## 2018-05-29 MED ORDER — CHLORHEXIDINE GLUCONATE CLOTH 2 % EX PADS
6.0000 | MEDICATED_PAD | Freq: Every day | CUTANEOUS | Status: DC
  Administered 2018-05-29: 6 via TOPICAL

## 2018-05-29 NOTE — Progress Notes (Signed)
During vitals check, patient was discovered to be extremely diaphoretic, having drenched her gown and bed with sweat. Patient's CBG was obtained and resulted in 16. Compression wrap was discovered to be soiled with bright red blood. Patient also inadvertently removed both peripheral IVs in her right hand, which lost IV access. Dr. Donnetta Hutching paged and informed of situation. IV team consulted, obtained right AC PIV, Dr. Donnetta Hutching provided verbal orders with readback, ordering 1 ampule of D50 once IV access was obtained. Patient was still alert and oriented with CBG of 16, therefore Dr. Donnetta Hutching advised to wait for IV team versus administering IM glucagon. Dr. Donnetta Hutching also informed of left leg stump bleeding through dressing, and ordered the dressing to be changed, using Kerlix and covered with 6" compression wrap. Continued monitoring of site for bleeding initiated. Once IV access was obtained, D50 was administered and after 15 minutes CBG was retaken. CBG result was 141. Patient condition will continue to be monitored.   Lucita Lora, RN

## 2018-05-29 NOTE — Progress Notes (Signed)
Inpatient Diabetes Program Recommendations  AACE/ADA: New Consensus Statement on Inpatient Glycemic Control (2015)  Target Ranges:  Prepandial:   less than 140 mg/dL      Peak postprandial:   less than 180 mg/dL (1-2 hours)      Critically ill patients:  140 - 180 mg/dL   Lab Results  Component Value Date   GLUCAP 76 05/29/2018   HGBA1C 8.0 (H) 10/12/2017     Results for TENA, LINEBAUGH (MRN [id number]) as of 05/29/2018 12:09  Ref. Range 05/28/2018 09:59 05/28/2018 16:37 05/28/2018 21:20 05/29/2018 03:37 05/29/2018 03:40 05/29/2018 03:58 05/29/2018 04:32 05/29/2018 06:29 05/29/2018 11:18  Glucose-Capillary Latest Ref Range: 70 - 99 mg/dL 86 110 (H) 91 16 (LL) 23 (LL) 17 (LL) 141 (H) 116 (H) 76     DM2  Home DM meds: Humalog mix 50/50 Kwikpen : 20 units in am / 14 units in evening (hold for CBG under 200)  Current inpatient DM meds: Novolog sensitive correction scale (0-9 units) tid                                               Lantus 10 units q hs   Read note from 0400 this am where patient had pulled out IVs and was diaphoretic with a CBG of 16. Recommend decreasing Lantus dose.     MD please consider following recommendations:  Decrease Lantus to 4 units q hs   Thank you.  -- Will follow during hospitalization.--  Jonna Clark RN, MSN Diabetes Coordinator Inpatient Glycemic Control Team Team Pager: [phone] (8am-5pm)

## 2018-05-29 NOTE — Progress Notes (Signed)
[facility] NEPHROLOGY PROGRESS NOTE  Assessment/ Plan: Pt is a 85 y.o. yo female ESRD, peripheral vascular disease status post left BKA.  Dialysis Orders: TTS [facility] 4.25 hours 400/800 EDW 63 (ususally gets to 64.5 - 65) 2K 2 Ca left upper AVF heparin 6000 Mircera 75 q 2 wks - last 1/23 no Fe calcitriol 0.25 Recent labs: hgb 10.8 30% sat ferritin 2190 iPTH 296 Ca/P ok  #PVD status post left BKA on 1/30 first.  As per [facility].  # ESRD: Status post dialysis yesterday because of scheduling issue.  Resume TTS schedule.  No heparin.  Serum potassium 3.8.  # Anemia of kidney disease: Continue Aranesp.  Check iron stores.  Monitor CBC.  # Secondary hyperparathyroidism: ca, phos ok.  Continue calcitriol, PhosLo.  # HTN/volume: Monitor blood pressure.  Continue current antihypertensive medication  Subjective: Seen and examined at bedside.  Denied headache, dizziness, nausea, vomiting, chest pain, shortness of breath.  Patient's son at bedside. Objective Vital signs in last 24 hours: Vitals:   05/28/18 1001 05/28/18 1638 05/28/18 2009 05/29/18 0300  BP: (!) 104/38 (!) 126/41 (!) 123/51 (!) 120/43  Pulse:  77 89 73  Resp: 17  19 11   Temp:   98.7 F (37.1 C) (!) 97.4 F (36.3 C)  TempSrc:   Oral Oral  SpO2: 98%  100% 99%  Weight:      Height:       Weight change: -0.7 kg  Intake/Output Summary (Last 24 hours) at 05/29/2018 0912 Last data filed at 05/29/2018 0800 Gross per 24 hour  Intake 743 ml  Output 0 ml  Net 743 ml       Labs: Basic Metabolic Panel: Recent Labs  Lab 05/25/18 2130  05/27/18 0335 05/28/18 0213 05/29/18 0445  NA 132*  --  135 137 139  K 3.5  --  4.6 4.2 3.8  CL 92*  --  95* 94* 99  CO2 26  --  26 26 25   GLUCOSE 316*  --  262* 95 133*  BUN 42*  --  21 32* 23  CREATININE 6.82*   < > 4.62* 6.21* 4.07*  CALCIUM 8.4*  --  9.1 8.8* 8.4*  PHOS 4.3  --   --   --   --    < > = values in this interval not displayed.   Liver Function Tests: Recent  Labs  Lab 05/25/18 2130  ALBUMIN 2.5*   No results for input(s): LIPASE, AMYLASE in the last 168 hours. No results for input(s): AMMONIA in the last 168 hours. CBC: Recent Labs  Lab 05/25/18 2205 05/26/18 1327 05/27/18 0335 05/28/18 0213 05/29/18 0445  WBC 7.1 6.6 9.4 8.0 9.5  HGB 9.1* 9.7* 8.7* 8.0* 8.5*  HCT 29.6* 31.0* 28.1* 26.8* 28.6*  MCV 98.7 99.4 99.6 100.8* 101.4*  PLT 147* 157 143* 140* 152   Cardiac Enzymes: No results for input(s): CKTOTAL, CKMB, CKMBINDEX, TROPONINI in the last 168 hours. CBG: Recent Labs  Lab 05/29/18 0337 05/29/18 0340 05/29/18 0358 05/29/18 0432 05/29/18 0629  GLUCAP 16* 23* 17* 141* 116*    Iron Studies: No results for input(s): IRON, TIBC, TRANSFERRIN, FERRITIN in the last 72 hours. Studies/Results: No results found.  Medications: Infusions: . sodium chloride Stopped (05/26/18 1203)  . sodium chloride    . sodium chloride    . sodium chloride 10 mL/hr at 05/26/18 0944  . magnesium sulfate 1 - 4 g bolus IVPB      Scheduled Medications: .  aspirin EC  325 mg Oral QPM  . calcitRIOL  0.25 mcg Oral Q T,Th,Sa-HD  . calcium acetate  667 mg Oral TID WC  . Chlorhexidine Gluconate Cloth  6 each Topical Q0600  . citalopram  20 mg Oral QPM  . cloNIDine  0.1 mg Oral BID  . clopidogrel  75 mg Oral Q breakfast  . [START ON 06/01/2018] darbepoetin (ARANESP) injection - DIALYSIS  60 mcg Intravenous Q Thu-HD  . docusate sodium  100 mg Oral Daily  . donepezil  5 mg Oral QHS  . feeding supplement (NEPRO CARB STEADY)  237 mL Oral BID BM  . heparin  5,000 Units Subcutaneous Q8H  . insulin aspart  0-9 Units Subcutaneous TID WC  . insulin glargine  10 Units Subcutaneous QHS  . isosorbide mononitrate  30 mg Oral QPM  . metoprolol succinate  12.5 mg Oral QPM  . multivitamin  1 tablet Oral QHS  . pantoprazole  40 mg Oral Daily  . sodium chloride flush  3 mL Intravenous Q12H    have reviewed scheduled and prn medications.  Physical  Exam: General:NAD, comfortable Heart:RRR, s1s2 nl Lungs:clear b/l, no crackle Abdomen:soft, Non-tender, non-distended Extremities:No edema Dialysis Access: aVF   Glenda [name] Glenda [name] Glenda [name] Glenda [name] Glenda [name] 05/29/2018,9:12 AM  LOS: 5 days

## 2018-05-29 NOTE — Clinical Social Work Note (Signed)
Clinical Social Work Assessment  Patient Details  Name: Glenda [name] MRN: [id number] Date of Birth: [birth year]  Date of referral:  05/29/18               Reason for consult:  Facility Placement, Discharge Planning                Permission sought to share information with:  Facility Sport and exercise psychologist, Family Supports Permission granted to share information::     Name::     Glenda [name]  Agency::  [facility]NF[facility] and [facility]  Relationship::  daughter  Contact Information:  [phone]  Housing/Transportation Living arrangements for the past 2 months:  Single Family Home Source of Information:  Adult Children Patient Interpreter Needed:  None Criminal Activity/Legal Involvement Pertinent to Current Situation/Hospitalization:  No - Comment as needed Significant Relationships:  Adult Children Lives with:  Self Do you feel safe going back to the place where you live?  Yes Need for family participation in patient care:  Yes (Comment)  Care giving concerns: Patient from home. Has [facility]. PT recommending CIR vs 24 hour care. [facility] agreeable for patient to go to SNF.   Social Worker assessment / plan: CSW faxed out initial SNF referrals.  CSW spoke to patient's daughter, [name], on the phone. [name] has visited [facility] and [facility], and has chosen [facility]. [facility] in [place] did not offer a bed for patient. [facility] has bed available.  Also discussed with patient's [facility] social worker, [place]. [facility] is agreeable for patient to admit to SNF. [facility] will transport patient to HD while patient in SNF.   CSW to follow for medical readiness and will support with discharge planning.   Employment status:  Retired Management consultant) PT Recommendations:  [place] / Referral to community resources:  [place] [facility]  Patient/Family's Response to care: Patient's daughter appreciative of care.  Patient/Family's  Understanding of and Emotional Response to Diagnosis, Current Treatment, and Prognosis: Daughter with understanding of patient's condition and agreeable to SNF.  Emotional Assessment Appearance:  Appears stated age Attitude/Demeanor/Rapport:  Unable to Assess Affect (typically observed):  Unable to Assess Orientation:  Oriented to Self, Oriented to Place, Oriented to  Time, Oriented to Situation Alcohol / Substance use:  Not Applicable Psych involvement (Current and /or in the community):  No (Comment)  Discharge Needs  Concerns to be addressed:  Discharge Planning Concerns, Care Coordination Readmission within the last 30 days:  Yes Current discharge risk:  Physical Impairment Barriers to Discharge:  Continued Medical Work up   Estanislado Emms, [place] 05/29/2018, 12:06 PM

## 2018-05-29 NOTE — Care Management Important Message (Signed)
Important Message  Patient Details  Name: Glenda [name] MRN: [id number] Date of Birth: 1933/06/08   Medicare Important Message Given:  Yes    [name] P [name] 05/29/2018, 4:11 PM

## 2018-05-29 NOTE — Progress Notes (Signed)
Physical Therapy Treatment Patient Details Name: Glenda [name] MRN: [id number] DOB: [birth year] Today's Date: 05/29/2018    History of Present Illness 85 y.o. female with ESRD on TTS HD at [facility] with hx HTN, PVD, DM, treated breast cancer admitted for bilateral LE angiogram with nonhealing left great toe amputation. Now s/p Left below-knee amputation on 05/26/2018.    PT Comments    Patient seen in conjunction with OT for activity progression. Patient tolerated session very well with increased time and focus on pre gait activities. Assisted patient with sit <> stand multiple times with incorporation of therapeutic activities and strengthening.  Patient's son present during session. Continue to feel that patient is a great candidate for comprehensive inpatient therapies as patient and family are very motivated and patient continues to make steady progress. Current POC remains appropriate.   Follow Up Recommendations  CIR;Supervision/Assistance - 24 hour     Equipment Recommendations  Other (comment)(tbd)    Recommendations for Other Services Rehab consult     Precautions / Restrictions Precautions Precautions: Fall Restrictions Weight Bearing Restrictions: No Other Position/Activity Restrictions: L BKA    Mobility  Bed Mobility Overal bed mobility: Needs Assistance Bed Mobility: Supine to Sit     Supine to sit: Min guard;HOB elevated     General bed mobility comments: Min guard to come to EOB and sit up. Increased time and effort to perform with [facility] elevated 25 degrees  Transfers Overall transfer level: Needs assistance Equipment used: Rolling walker (2 wheeled);None Transfers: Sit to/from [employer] Sit to Stand: Mod assist;+2 physical assistance;Max assist   Squat pivot transfers: Mod assist;Max assist;+2 physical assistance     General transfer comment: Performed sit <> stand x3 with static standing approx 10~20 second trials.  Ambulation/Gait                  Stairs             Wheelchair Mobility    Modified Rankin (Stroke Patients Only)       Balance Overall balance assessment: Needs assistance Sitting-balance support: Bilateral upper extremity supported Sitting balance-Leahy Scale: Fair     Standing balance support: Bilateral upper extremity supported;During functional activity Standing balance-Leahy Scale: Poor Standing balance comment: Reliance on RW and external support with facilitation for upright posture and positioning                            Cognition Arousal/Alertness: Awake/alert Behavior During Therapy: WFL for tasks assessed/performed Overall Cognitive Status: History of cognitive impairments - at baseline                                        Exercises Amputee Exercises Hip Flexion/Marching: Left;Seated;5 reps Other Exercises Other Exercises: tricep dips with physical assist in standing with RW in preparation for hop to advancement Other Exercises: Seated weight shifts for pressure relief    General Comments General comments (skin integrity, edema, etc.): son present during session      Pertinent Vitals/Pain Pain Assessment: Faces Faces Pain Scale: Hurts little more Pain Location: LLE Pain Descriptors / Indicators: Grimacing    Home Living                      Prior Function            PT Goals (current goals can  now be found in the care plan section) Acute Rehab PT Goals Patient Stated Goal: move better PT Goal Formulation: With patient Time For Goal Achievement: 06/10/18 Potential to Achieve Goals: Good Progress towards PT goals: Progressing toward goals    Frequency    Min 3X/week      PT Plan Current plan remains appropriate    Co-evaluation PT/OT/SLP Co-Evaluation/Treatment: Yes Reason for Co-Treatment: For patient/therapist safety PT goals addressed during session: Mobility/safety with mobility OT goals  addressed during session: ADL's and self-care      AM-PAC PT "6 Clicks" Mobility   Outcome Measure  Help needed turning from your back to your side while in a flat bed without using bedrails?: A Little Help needed moving from lying on your back to sitting on the side of a flat bed without using bedrails?: A Lot Help needed moving to and from a bed to a chair (including a wheelchair)?: A Lot Help needed standing up from a chair using your arms (e.g., wheelchair or bedside chair)?: A Lot Help needed to walk in hospital room?: Total Help needed climbing 3-5 steps with a railing? : Total 6 Click Score: 11    End of Session Equipment Utilized During Treatment: Gait belt Activity Tolerance: Patient tolerated treatment well Patient left: in chair;with call bell/phone within reach;with chair alarm set Nurse Communication: Mobility status PT Visit Diagnosis: Unsteadiness on feet (R26.81);Difficulty in walking, not elsewhere classified (R26.2)     Time: 7356-7014 PT Time Calculation (min) (ACUTE ONLY): 28 min  Charges:  $Therapeutic Activity: 8-22 mins                     Alben Deeds, PT DPT  Board Certified Neurologic Specialist [place] Pager [phone] Office [place] 05/29/2018, 10:33 AM

## 2018-05-29 NOTE — Progress Notes (Signed)
[facility] [place]  Subjective  - Hypoglycemia overnight - feels better this am.  Slight oozing from L BKA dressing.   Objective (!) 120/43 73 (!) 97.4 F (36.3 C) (Oral) 11 99%  Intake/Output Summary (Last 24 hours) at 05/29/2018 0734 Last data filed at 05/29/2018 0400 Gross per 24 hour  Intake 443 ml  Output 0 ml  Net 443 ml    R DP palpable Dry gangrene to tip of R great toe - stable Left BKA c/d/i - appears to be healing well  Laboratory Lab Results: Recent Labs    05/28/18 0213 05/29/18 0445  WBC 8.0 9.5  HGB 8.0* 8.5*  HCT 26.8* 28.6*  PLT 140* 152   BMET Recent Labs    05/28/18 0213 05/29/18 0445  NA 137 139  K 4.2 3.8  CL 94* 99  CO2 26 25  GLUCOSE 95 133*  BUN 32* 23  CREATININE 6.21* 4.07*  CALCIUM 8.8* 8.4*    COAG Lab Results  Component Value Date   INR 0.96 04/28/2018   INR 0.99 04/28/2018   INR 1.00 10/12/2017   No results found for: PTT  Assessment/Planning:  POD#3 s/p L BKA and R AT atherectomy last week.  BKA healing without issue - dressing changed at bedside.  Right DP palpable after atherectomy.  Plan for SNF placement and appreciate social work/case management.  Will change to sensitive sliding scale for hypoglycemia.  Discussed plan of care with family.  [name] 05/29/2018 7:34 AM --

## 2018-05-29 NOTE — Progress Notes (Signed)
Occupational Therapy Treatment Patient Details Name: Glenda [name] MRN: [id number] DOB: Apr 26, 1934 Today's Date: 05/29/2018    History of present illness 85 y.o. female with ESRD on TTS HD at [facility] with hx HTN, PVD, DM, treated breast cancer admitted for bilateral LE angiogram with nonhealing left great toe amputation. Now s/p Left below-knee amputation on 05/26/2018.   OT comments  Pt continues to be highly motivated to gain strength and independence. Educated in compensatory strategies for LB ADL and performed seated grooming at sink. Requires 2 person assist for OOB. Continue to recommend CIR.  Follow Up Recommendations  CIR    Equipment Recommendations       Recommendations for Other Services Rehab consult    Precautions / Restrictions Precautions Precautions: Fall Restrictions Weight Bearing Restrictions: No Other Position/Activity Restrictions: L BKA       Mobility Bed Mobility Overal bed mobility: Needs Assistance Bed Mobility: Supine to Sit     Supine to sit: Min guard;HOB elevated     General bed mobility comments: Min guard to come to EOB and sit up. Increased time and effort to perform with [facility] elevated 25 degrees  Transfers Overall transfer level: Needs assistance Equipment used: Rolling walker (2 wheeled);None Transfers: Sit to/from [employer] Sit to Stand: Mod assist;+2 physical assistance;Max assist   Squat pivot transfers: Mod assist;+2 physical assistance     General transfer comment: Performed sit <> stand x3 with static standing approx 10~20 second trials.    Balance Overall balance assessment: Needs assistance Sitting-balance support: Bilateral upper extremity supported Sitting balance-Leahy Scale: Fair     Standing balance support: Bilateral upper extremity supported;During functional activity Standing balance-Leahy Scale: Poor Standing balance comment: Reliance on RW and external support with facilitation for upright  posture and positioning                           ADL either performed or assessed with clinical judgement   ADL Overall ADL's : Needs assistance/impaired     Grooming: Minimal assistance;Sitting;Wash/dry face;Oral care           Upper Body Dressing : Minimal assistance;Sitting                     General ADL Comments: educated pt in compensatory strategies leaning side to side for LB ADL and in pressure relief      Vision       Perception     Praxis      Cognition Arousal/Alertness: Awake/alert Behavior During Therapy: WFL for tasks assessed/performed Overall Cognitive Status: History of cognitive impairments - at baseline                                          Exercises    Shoulder Instructions       General Comments son present during session    Pertinent Vitals/ Pain       Pain Assessment: Faces Faces Pain Scale: Hurts little more Pain Location: LLE Pain Descriptors / Indicators: Grimacing Pain Intervention(s): Monitored during session;Repositioned  Home Living                                          Prior Functioning/Environment  Frequency  Min 3X/week        Progress Toward Goals  OT Goals(current goals can now be found in the care plan section)  Progress towards OT goals: Progressing toward goals  Acute Rehab OT Goals Patient Stated Goal: move better OT Goal Formulation: With patient/family Time For Goal Achievement: 06/10/18 Potential to Achieve Goals: Good  Plan Discharge plan remains appropriate    Co-evaluation    PT/OT/SLP Co-Evaluation/Treatment: Yes Reason for Co-Treatment: For patient/therapist safety PT goals addressed during session: Mobility/safety with mobility OT goals addressed during session: ADL's and self-care      AM-PAC OT "6 Clicks" Daily Activity     Outcome Measure   Help from another person eating meals?: None Help from another  person taking care of personal grooming?: A Little Help from another person toileting, which includes using toliet, bedpan, or urinal?: A Lot Help from another person bathing (including washing, rinsing, drying)?: A Lot Help from another person to put on and taking off regular upper body clothing?: A Little Help from another person to put on and taking off regular lower body clothing?: A Lot 6 Click Score: 16    End of Session Equipment Utilized During Treatment: Gait belt;Rolling walker  OT Visit Diagnosis: Unsteadiness on feet (R26.81);Muscle weakness (generalized) (M62.81);Pain;Other symptoms and signs involving cognitive function;Other abnormalities of gait and mobility (R26.89)   Activity Tolerance Patient tolerated treatment well   Patient Left in chair;with call bell/phone within reach;with family/visitor present   Nurse Communication          Time: 2767-0110 OT Time Calculation (min): 28 min  Charges: OT General Charges $OT Visit: 1 Visit OT Treatments $Self Care/Home Management : 8-22 mins  Nestor Lewandowsky, OTR/L Acute Rehabilitation Services Pager: [phone] Office: [phone]   Malka So 05/29/2018, 10:46 AM

## 2018-05-30 LAB — CBC
HCT: 24.3 % — ABNORMAL LOW (ref 36.0–46.0)
Hemoglobin: 7.3 g/dL — ABNORMAL LOW (ref 12.0–15.0)
MCH: 30 pg (ref 26.0–34.0)
MCHC: 30 g/dL (ref 30.0–36.0)
MCV: 100 fL (ref 80.0–100.0)
Platelets: 169 10*3/uL (ref 150–400)
RBC: 2.43 MIL/uL — ABNORMAL LOW (ref 3.87–5.11)
RDW: 17.4 % — ABNORMAL HIGH (ref 11.5–15.5)
WBC: 10.8 10*3/uL — ABNORMAL HIGH (ref 4.0–10.5)
nRBC: 0 % (ref 0.0–0.2)

## 2018-05-30 LAB — GLUCOSE, CAPILLARY
Glucose-Capillary: 16 mg/dL — CL (ref 70–99)
Glucose-Capillary: 168 mg/dL — ABNORMAL HIGH (ref 70–99)
Glucose-Capillary: 104 mg/dL — ABNORMAL HIGH (ref 70–99)

## 2018-05-30 LAB — RENAL FUNCTION PANEL
Albumin: 2.3 g/dL — ABNORMAL LOW (ref 3.5–5.0)
Anion gap: 15 (ref 5–15)
BUN: 35 mg/dL — ABNORMAL HIGH (ref 8–23)
CO2: 26 mmol/L (ref 22–32)
Calcium: 8.2 mg/dL — ABNORMAL LOW (ref 8.9–10.3)
Chloride: 92 mmol/L — ABNORMAL LOW (ref 98–111)
Creatinine, Ser: 5.83 mg/dL — ABNORMAL HIGH (ref 0.44–1.00)
GFR calc non Af Amer: 6 mL/min — ABNORMAL LOW (ref >60)
GFR, EST AFRICAN AMERICAN: 7 mL/min — AB (ref >60)
Glucose, Bld: 169 mg/dL — ABNORMAL HIGH (ref 70–99)
Phosphorus: 4.2 mg/dL (ref 2.5–4.6)
Potassium: 3.9 mmol/L (ref 3.5–5.1)
Sodium: 133 mmol/L — ABNORMAL LOW (ref 135–145)

## 2018-05-30 MED ORDER — CLOPIDOGREL BISULFATE 75 MG PO TABS: 75.0000 mg | ORAL_TABLET | Freq: Every day | ORAL | 1 refills | Status: DC

## 2018-05-30 MED ORDER — ACETAMINOPHEN 325 MG PO TABS
ORAL_TABLET | ORAL | Status: AC
  Filled 2018-05-30: qty 2

## 2018-05-30 MED ORDER — PENTAFLUOROPROP-TETRAFLUOROETH EX AERO: 1.0000 "application " | INHALATION_SPRAY | CUTANEOUS | Status: DC | PRN

## 2018-05-30 MED ORDER — ONDANSETRON HCL 4 MG/2ML IJ SOLN
INTRAMUSCULAR | Status: AC
  Filled 2018-05-30: qty 2

## 2018-05-30 MED ORDER — LIDOCAINE HCL (PF) 1 % IJ SOLN: 5.0000 mL | INTRAMUSCULAR | Status: DC | PRN

## 2018-05-30 MED ORDER — CLOPIDOGREL BISULFATE 75 MG PO TABS: 75.0000 mg | ORAL_TABLET | Freq: Every day | ORAL | 2 refills | Status: DC

## 2018-05-30 MED ORDER — OXYCODONE-ACETAMINOPHEN 5-325 MG PO TABS: 1.0000 | ORAL_TABLET | Freq: Four times a day (QID) | ORAL | 0 refills | Status: DC | PRN

## 2018-05-30 MED ORDER — CLOPIDOGREL BISULFATE 75 MG PO TABS: 75.0000 mg | ORAL_TABLET | Freq: Every day | ORAL | 1 refills | Status: AC

## 2018-05-30 MED ORDER — ASPIRIN 81 MG PO TBEC: 81.0000 mg | DELAYED_RELEASE_TABLET | Freq: Every evening | ORAL | Status: AC

## 2018-05-30 MED ORDER — SODIUM CHLORIDE 0.9 % IV SOLN: 100.0000 mL | INTRAVENOUS | Status: DC | PRN

## 2018-05-30 MED ORDER — CALCITRIOL 0.25 MCG PO CAPS
ORAL_CAPSULE | ORAL | Status: AC
  Administered 2018-05-30: 0.25 ug via ORAL
  Filled 2018-05-30: qty 1

## 2018-05-30 MED ORDER — HEPARIN SODIUM (PORCINE) 1000 UNIT/ML DIALYSIS: 1000.0000 [IU] | INTRAMUSCULAR | Status: DC | PRN

## 2018-05-30 MED ORDER — OXYCODONE-ACETAMINOPHEN 5-325 MG PO TABS: 1.0000 | ORAL_TABLET | ORAL | 0 refills | Status: DC | PRN

## 2018-05-30 MED ORDER — ASPIRIN 81 MG PO TBEC: 81.0000 mg | DELAYED_RELEASE_TABLET | Freq: Every evening | ORAL | Status: DC

## 2018-05-30 MED ORDER — LIDOCAINE-PRILOCAINE 2.5-2.5 % EX CREA: 1.0000 "application " | TOPICAL_CREAM | CUTANEOUS | Status: DC | PRN

## 2018-05-30 NOTE — Progress Notes (Signed)
[facility] [place]  Subjective  - Seen in dialysis.  No complaints.   Objective (!) 134/56 67 98 F (36.7 C) (Oral) 12 98%  Intake/Output Summary (Last 24 hours) at 05/30/2018 0823 Last data filed at 05/29/2018 1816 Gross per 24 hour  Intake 240 ml  Output -  Net 240 ml    R DP palpable Dry gangrene to tip of R great toe - stable Left BKA c/d/i - appears to be healing well  Laboratory Lab Results: Recent Labs    05/29/18 0445 05/30/18 0748  WBC 9.5 10.8*  HGB 8.5* 7.3*  HCT 28.6* 24.3*  PLT 152 169   BMET Recent Labs    05/28/18 0213 05/29/18 0445  NA 137 139  K 4.2 3.8  CL 94* 99  CO2 26 25  GLUCOSE 95 133*  BUN 32* 23  CREATININE 6.21* 4.07*  CALCIUM 8.8* 8.4*    COAG Lab Results  Component Value Date   INR 0.96 04/28/2018   INR 0.99 04/28/2018   INR 1.00 10/12/2017   No results found for: PTT  Assessment/Planning:  POD#4 s/p L BKA and R AT atherectomy last week.  BKA healing without issue.    Right DP palpable after atherectomy.  Plan for discharge to SNF today.  Will arrange follow-up in 3 weeks for staple removal of L BKA and to monitor right great toe.  Discussed plan of care with family.  No hypoglycemia overnight after decreasing lantus dose.  [name] 05/30/2018 8:23 AM --

## 2018-05-30 NOTE — Social Work (Signed)
Patient will discharge to [facility] Anticipated discharge date: 05/30/2018 Family notified: Eldridge Dace, daughter Transportation by: [facility] - pickup scheduled for 1 pm  Nurse to call report to [phone].  CSW signing off.  Estanislado Emms, [place]  Clinical Social Worker

## 2018-05-30 NOTE — Progress Notes (Signed)
Report called to RN at [facility]. All questions answered.

## 2018-05-30 NOTE — Discharge Instructions (Signed)
° °  [facility] [place] ° °Discharge Instructions ° °Lower Extremity Angiogram; Angioplasty/Stenting ° °Please refer to the following instructions for your post-procedure care. Your surgeon or physician assistant will discuss any changes with you. ° °Activity ° °Avoid lifting more than 8 pounds (1 gallons of milk) for 72 hours (3 days) after your procedure. You may walk as much as you can tolerate. It's OK to drive after 72 hours. ° °Bathing/Showering ° °You may shower the day after your procedure. If you have a bandage, you may remove it at 24- 48 hours. Clean your incision site with mild soap and water. Pat the area dry with a clean towel. ° °Diet ° °Resume your pre-procedure diet. There are no special food restrictions following this procedure. All patients with peripheral vascular disease should follow a low fat/low cholesterol diet. In order to heal from your surgery, it is CRITICAL to get adequate nutrition. Your body requires vitamins, minerals, and protein. Vegetables are the best source of vitamins and minerals. Vegetables also provide the perfect balance of protein. Processed food has little nutritional value, so try to avoid this. ° °Medications ° °Resume taking all of your medications unless your doctor tells you not to. If your incision is causing pain, you may take over-the-counter pain relievers such as acetaminophen (Tylenol) ° °Follow Up ° °Follow up will be arranged at the time of your procedure. You may have an office visit scheduled or may be scheduled for surgery. Ask your surgeon if you have any questions. ° °Please call us immediately for any of the following conditions: °•Severe or worsening pain your legs or feet at rest or with walking. °•Increased pain, redness, drainage at your groin puncture site. °•Fever of 101 degrees or higher. °•If you have any mild or slow bleeding from your puncture site: lie down, apply firm constant pressure over the area with a piece of  gauze or a clean wash cloth for 30 minutes- no peeking!, call 911 right away if you are still bleeding after 30 minutes, or if the bleeding is heavy and unmanageable. ° °Reduce your risk factors of vascular disease: ° °Stop smoking. If you would like help call [facility] at 1-800-QUIT-NOW (1-800-784-8669) or [place] at 336-586-4000. °Manage your cholesterol °Maintain a desired weight °Control your diabetes °Keep your blood pressure down ° °If you have any questions, please call the office at [phone] ° °

## 2018-05-30 NOTE — Discharge Summary (Signed)
Physician Discharge Summary   Patient ID: Glenda [name] [id number] 84 y.o. 01/31/1934  Admit date: 05/24/2018  Discharge date and time: 05/30/18   Admitting Physician: [name], MD   Discharge Physician: same  Admission Diagnoses: pvd ulcer  Discharge Diagnoses: same  Admission Condition: poor  Discharged Condition: fair  Indication for Admission: Critical limb ischemia of the right lower extremity with tissue loss and great toe dry gangrene; nonhealing toe amputation left lower extremity with critical limb ischemia  Hospital Course: [name] is a 86 year old female who was brought in as an outpatient for right lower extremity arteriogram due to critical limb ischemia and tissue loss with great toe dry gangrene.  She was taken to the peripheral vascular lab by Dr. Carlis Abbott on 05/24/2018 and underwent anterior tibial artery atherectomy and balloon angioplasty.  She tolerated this procedure well and was admitted to the hospital postoperatively.  She also has a nonhealing toe amputation on the left lower extremity without any further revascularization options.  On 05/26/2018 she was brought to the operating room for a left below the knee amputation by Dr. Carlis Abbott.  She also tolerated the surgery well.  It should be noted that she maintained a palpable right anterior tibial artery pulse throughout her hospital stay.  Postoperatively she was evaluated by physical therapy and Occupational Therapy who recommended CIR.  Nephrology was consulted for management of end-stage renal disease on hemodialysis during hospitalization.  Clinical social worker was also consulted.  Patient is already a part of [facility] and would be more appropriate for skilled nursing facility stay.  Over the next several days bed request were made and patient was accepted to [facility] skilled nursing facility.  She will have her hemodialysis treatment this morning and will be discharged after her treatment if  stable.  She will follow-up in office in about 3 weeks for right lower extremity arterial duplex as well as possible staple removal and wound check of left BKA.  She will need to take aspirin in addition to Plavix.  Plavix prescription will be on paper chart.  She will also be prescribed 2 to 3 days of narcotic pain medication for continued postoperative pain control.  She will be discharged after hemodialysis treatment in stable condition to [facility] skilled nursing facility.  Consults: nephrology  Treatments: surgery: Right lower extremity arteriogram with anterior tibial artery atherectomy and balloon angioplasty by Dr. Carlis Abbott on 05/24/2018 Left below the knee amputation by Dr. Carlis Abbott on 05/26/2018  Discharge Exam: See progress note 05/30/2018 Vitals:   05/30/18 0915 05/30/18 0930  BP: (!) 113/51 (!) 100/49  Pulse: 70 76  Resp:    Temp:    SpO2:       Disposition: Discharge disposition: 03-Skilled Nursing Facility       Patient Instructions:  Allergies as of 05/30/2018   No Known Allergies     Medication List    TAKE these medications   acetaminophen 650 MG CR tablet Commonly known as:  TYLENOL Take 1,300 mg by mouth every 8 (eight) hours as needed for pain.   aspirin 81 MG EC tablet Take 1 tablet (81 mg total) by mouth every evening. What changed:    medication strength  how much to take   b complex-vitamin c-folic acid 0.8 MG Tabs tablet Take 1 tablet by mouth every evening.   calcium acetate 667 MG capsule Commonly known as:  PHOSLO Take 667 mg by mouth 3 (three) times daily with meals.   citalopram 20  MG tablet Commonly known as:  CELEXA Take 20 mg by mouth every evening.   cloNIDine 0.1 MG tablet Commonly known as:  CATAPRES Take 0.1 mg by mouth See admin instructions. Take 0.1 mg by mouth in the evening and 0.1 mg by mouth at bedtime   clopidogrel 75 MG tablet Commonly known as:  PLAVIX Take 1 tablet (75 mg total) by mouth daily.   donepezil 5 MG  tablet Commonly known as:  ARICEPT Take 5 mg by mouth at bedtime.   famotidine 10 MG chewable tablet Commonly known as:  PEPCID AC Chew 10 mg by mouth daily as needed for heartburn.   HUMALOG MIX 50/50 KWIKPEN (50-50) 100 UNIT/ML Kwikpen Generic drug:  Insulin Lispro Prot & Lispro Inject 14-20 Units into the skin See admin instructions. Inject 20 units in the morning and 14 units at evening; hold for blood sugar readings lower than 200   isosorbide mononitrate 30 MG 24 hr tablet Commonly known as:  IMDUR Take 1 tablet (30 mg total) daily by mouth. What changed:  when to take this   metoprolol succinate 25 MG 24 hr tablet Commonly known as:  TOPROL-XL Take 1 tablet (25 mg total) by mouth daily. What changed:    how much to take  when to take this   oxyCODONE-acetaminophen 5-325 MG tablet Commonly known as:  PERCOCET/ROXICET Take 1 tablet by mouth every 4 (four) hours as needed for severe pain. What changed:    how much to take  when to take this   pantoprazole 20 MG tablet Commonly known as:  PROTONIX Take 20 mg by mouth every evening.   pravastatin 80 MG tablet Commonly known as:  PRAVACHOL Take 80 mg by mouth at bedtime.      Activity: activity as tolerated Diet: regular diet Wound Care: keep wound clean and dry  Follow-up with Dr. Carlis Abbott in 3 weeks.  SignedDagoberto Ligas 05/30/2018 9:40 AM

## 2018-05-30 NOTE — Clinical Social Work Placement (Signed)
   CLINICAL SOCIAL WORK PLACEMENT  NOTE  Date:  05/30/2018  Patient Details  Name: Glenda [name] MRN: [id number] Date of Birth: [birth year]  Clinical Social Work is seeking post-discharge placement for this patient at the [place] level of care (*CSW will initial, date and re-position this form in  chart as items are completed):  Yes   Patient/family provided with [place] Work Department's list of facilities offering this level of care within the geographic area requested by the patient (or if unable, by the patient's family).  Yes   Patient/family informed of their freedom to choose among providers that offer the needed level of care, that participate in Medicare, Medicaid or managed care program needed by the patient, have an available bed and are willing to accept the patient.  Yes   Patient/family informed of [facility]'s ownership interest in [facility] and [facility], as well as of the fact that they are under no obligation to receive care at these facilities.  PASRR submitted to EDS on       PASRR number received on       Existing PASRR number confirmed on 05/28/18     FL2 transmitted to all facilities in geographic area requested by pt/family on 05/28/18     FL2 transmitted to all facilities within larger geographic area on       Patient informed that his/her managed care company has contracts with or will negotiate with certain facilities, including the following:  [place]     Yes   Patient/family informed of bed offers received.  Patient chooses bed at [facility]     Physician recommends and patient chooses bed at      Patient to be transferred to [facility] on 05/30/18.  Patient to be transferred to facility by [facility]     Patient family notified on 05/30/18 of transfer.  Name of family member notified:  Irven Coe, daughter     PHYSICIAN       Additional Comment:     _______________________________________________ Estanislado Emms, LCSW 05/30/2018, 12:25 PM

## 2018-05-30 NOTE — Progress Notes (Signed)
[facility] NEPHROLOGY PROGRESS NOTE  Assessment/ Plan: Pt is a 85 y.o. yo female ESRD, peripheral vascular disease status post left BKA.  Dialysis Orders: TTS [facility] 4.25 hours 400/800 EDW 63 (ususally gets to 64.5 - 65) 2K 2 Ca left upper AVF heparin 6000 Mircera 75 q 2 wks - last 1/23 no Fe calcitriol 0.25 Recent labs: hgb 10.8 30% sat ferritin 2190 iPTH 296 Ca/P ok  #PVD status post left BKA on 1/31.  Plan to discharge to SNF today.  # ESRD: HD today. Tolerating well. Serum potassium 3.9, UF goal 2-2.5 L as tolerated. Next HD on Thursday.  # Anemia of kidney disease: Drop in hemoglobin noted today, no sign of bleeding.  Unknown if it is dilution.  Continue Aranesp.  Monitor CBC.  # Secondary hyperparathyroidism: ca, phos ok.  Continue calcitriol, PhosLo.  # HTN/volume: Monitor blood pressure.  Continue current antihypertensive medication  Subjective: Seen and examined during dialysis.  Tolerating well.  Some discomfort at the site of surgery.  No chest pain, shortness of breath, headache or dizziness. Objective Vital signs in last 24 hours: Vitals:   05/30/18 0915 05/30/18 0930 05/30/18 0945 05/30/18 1000  BP: (!) 113/51 (!) 100/49 (!) 93/43 (!) 120/53  Pulse: 70 76 68 64  Resp:      Temp:      TempSrc:      SpO2:      Weight:      Height:       Weight change:   Intake/Output Summary (Last 24 hours) at 05/30/2018 1012 Last data filed at 05/29/2018 1816 Gross per 24 hour  Intake 240 ml  Output -  Net 240 ml       Labs: Basic Metabolic Panel: Recent Labs  Lab 05/25/18 2130  05/28/18 0213 05/29/18 0445 05/30/18 0749  NA 132*   < > 137 139 133*  K 3.5   < > 4.2 3.8 3.9  CL 92*   < > 94* 99 92*  CO2 26   < > 26 25 26   GLUCOSE 316*   < > 95 133* 169*  BUN 42*   < > 32* 23 35*  CREATININE 6.82*   < > 6.21* 4.07* 5.83*  CALCIUM 8.4*   < > 8.8* 8.4* 8.2*  PHOS 4.3  --   --   --  4.2   < > = values in this interval not displayed.   Liver Function  Tests: Recent Labs  Lab 05/25/18 2130 05/30/18 0749  ALBUMIN 2.5* 2.3*   No results for input(s): LIPASE, AMYLASE in the last 168 hours. No results for input(s): AMMONIA in the last 168 hours. CBC: Recent Labs  Lab 05/26/18 1327 05/27/18 0335 05/28/18 0213 05/29/18 0445 05/30/18 0748  WBC 6.6 9.4 8.0 9.5 10.8*  HGB 9.7* 8.7* 8.0* 8.5* 7.3*  HCT 31.0* 28.1* 26.8* 28.6* 24.3*  MCV 99.4 99.6 100.8* 101.4* 100.0  PLT 157 143* 140* 152 169   Cardiac Enzymes: No results for input(s): CKTOTAL, CKMB, CKMBINDEX, TROPONINI in the last 168 hours. CBG: Recent Labs  Lab 05/29/18 0629 05/29/18 1118 05/29/18 1641 05/29/18 2116 05/30/18 0634  GLUCAP 116* 76 164* 140* 168*    Iron Studies: No results for input(s): IRON, TIBC, TRANSFERRIN, FERRITIN in the last 72 hours. Studies/Results: No results found.  Medications: Infusions: . sodium chloride Stopped (05/26/18 1203)  . sodium chloride    . sodium chloride    . sodium chloride 10 mL/hr at 05/26/18 0944  .  sodium chloride    . sodium chloride    . magnesium sulfate 1 - 4 g bolus IVPB      Scheduled Medications: . aspirin EC  325 mg Oral QPM  . calcitRIOL  0.25 mcg Oral Q T,Th,Sa-HD  . calcium acetate  667 mg Oral TID WC  . Chlorhexidine Gluconate Cloth  6 each Topical Q0600  . Chlorhexidine Gluconate Cloth  6 each Topical Q0600  . citalopram  20 mg Oral QPM  . cloNIDine  0.1 mg Oral BID  . clopidogrel  75 mg Oral Q breakfast  . [START ON 06/01/2018] darbepoetin (ARANESP) injection - DIALYSIS  60 mcg Intravenous Q Thu-HD  . docusate sodium  100 mg Oral Daily  . donepezil  5 mg Oral QHS  . feeding supplement (NEPRO CARB STEADY)  237 mL Oral BID BM  . heparin  5,000 Units Subcutaneous Q8H  . insulin aspart  0-9 Units Subcutaneous TID WC  . insulin glargine  4 Units Subcutaneous QHS  . isosorbide mononitrate  30 mg Oral QPM  . metoprolol succinate  12.5 mg Oral QPM  . multivitamin  1 tablet Oral QHS  . pantoprazole  40  mg Oral Daily  . sodium chloride flush  3 mL Intravenous Q12H  . sodium phosphate  1 enema Rectal Once    have reviewed scheduled and prn medications.  Physical Exam: General: Not in distress, lying in bed comfortable Heart:RRR, s1s2 nl, no rubs Lungs: Rare bilateral, no crackle or wheeze Abdomen:soft, Non-tender, non-distended Extremities: No edema, amputation stump site with staples with no sign of bleeding or discharge. Dialysis Access: aVF cannulated  Glenda [name] Prasad Dwan Fennel 05/30/2018,10:12 AM  LOS: 6 days

## 2018-05-31 ENCOUNTER — Telehealth: Payer: Self-pay | Admitting: Vascular Surgery

## 2018-05-31 NOTE — Telephone Encounter (Signed)
sch appt no answer mld ltr 06/20/2018 9am RLE 1030am Staple removal MD

## 2018-05-31 NOTE — Telephone Encounter (Signed)
-----   Message from Dagoberto Ligas, PA-C sent at 05/30/2018  9:30 AM EST -----  Can you schedule an appt for this pt with Dr. Carlis Abbott for about 3 weeks with RLE arterial duplex.  PO RLE angiogram and L BKA.  Also staple removal. Thanks, MAtt

## 2018-06-12 ENCOUNTER — Other Ambulatory Visit: Payer: Self-pay

## 2018-06-12 DIAGNOSIS — I739 Peripheral vascular disease, unspecified: Secondary | ICD-10-CM

## 2018-06-20 ENCOUNTER — Ambulatory Visit (INDEPENDENT_AMBULATORY_CARE_PROVIDER_SITE_OTHER): Payer: Medicare (Managed Care) | Admitting: Vascular Surgery

## 2018-06-20 ENCOUNTER — Encounter: Payer: Self-pay | Admitting: Vascular Surgery

## 2018-06-20 ENCOUNTER — Other Ambulatory Visit: Payer: Self-pay

## 2018-06-20 ENCOUNTER — Ambulatory Visit (HOSPITAL_COMMUNITY)
Admission: RE | Admit: 2018-06-20 | Discharge: 2018-06-20 | Disposition: A | Discharge status: Home / Self Care | Payer: Medicare (Managed Care) | Source: Ambulatory Visit | Attending: Vascular Surgery | Admitting: Vascular Surgery

## 2018-06-20 VITALS — BP 159/55 | HR 71 | Temp 98.3°F | Resp 16 | Ht 66.0 in | Wt 130.0 lb

## 2018-06-20 DIAGNOSIS — I739 Peripheral vascular disease, unspecified: Secondary | ICD-10-CM | POA: Diagnosis present

## 2018-06-20 NOTE — Progress Notes (Signed)
Patient name: Glenda [name] MRN: [id number] DOB: 1934/03/12 Sex: female  REASON FOR VISIT: follow-up after L BKA and right leg revascularization  HPI: Glenda [name] is a 85 y.o. female with multiple medical problems that presents for interval follow-up.  She most recently underwent a left BKA on 05/26/2018 for critical limb ischemia with tissue loss.  In addition she also underwent a right lower extremity revascularization with an AT atherectomy and angioplasty for critical limb ischemia and tissue loss of the right great toe on 05/24/2018.  On follow-up today she is in a wheelchair and her daughter states she is at home now.  She denies any fevers or chills.  They have kept her left BKA wrapped and to their knowledge no apparent issues.  Now has a pressure ulcer on the right heel.  Past Medical History:  Diagnosis Date  . Arthritis    patient denies  . Breast cancer ([place])    Breast Cancer right   treated with radiation  . CAD (coronary artery disease)   . Chest pain   . CHF (congestive heart failure) ([place])   . Dizziness   . DM (diabetes mellitus) ([place])    TYpe II  . ESRD (end stage renal disease) ([place])    TTHSAT- [address]  . GERD (gastroesophageal reflux disease)   . HLD (hyperlipidemia)   . HTN (hypertension)   . Obesity   . PAF (paroxysmal atrial fibrillation) ([place])   . Radiation 09/11/15-10/13/15   42.72 Gy to right breast, 12 Gy boost to right breast  . Shortness of breath dyspnea    04/27/2018- not so much since she began dialysis  . Sleep apnea    no longer uses cpap   . Stroke [facility])    balance issues - walks with walker and uses wheelchair  Had 3 in 2017- last 1 was in October  . UTI (urinary tract infection)     Past Surgical History:  Procedure Laterality Date  . ABDOMINAL AORTOGRAM W/LOWER EXTREMITY N/A 04/27/2018   Procedure: ABDOMINAL AORTOGRAM W/LOWER EXTREMITY;  Surgeon: [name], MD;  Location: [place] CV LAB;  Service: Cardiovascular;   Laterality: N/A;  . AMPUTATION Left 04/28/2018   Procedure: AMPUTATION RAY LEFT;  Surgeon: [name], MD;  Location: [place];  Service: Vascular;  Laterality: Left;  . AMPUTATION Left 05/26/2018   Procedure: AMPUTATION BELOW KNEE, LEFT;  Surgeon: [name], MD;  Location: [place];  Service: Vascular;  Laterality: Left;  . AV FISTULA PLACEMENT Left 07/14/2017   Procedure: Left arm Brachicephalic Fistula Creation;  Surgeon: Serafina Mitchell, MD;  Location: [place];  Service: Vascular;  Laterality: Left;  . BALLOON DILATION N/A 08/18/2017   Procedure: BALLOON DILATION;  Surgeon: Clarene Essex, MD;  Location: [place];  Service: Endoscopy;  Laterality: N/A;  . BASCILIC VEIN TRANSPOSITION Left 10/12/2017   Procedure: SECOND STAGE BASILIC VEIN TRANSPOSITION LEFT ARM;  Surgeon: Serafina Mitchell, MD;  Location: [place];  Service: Vascular;  Laterality: Left;  . CHOLECYSTECTOMY    . colon polyps; hx    . CORONARY ANGIOPLASTY  02/14/09  . ESOPHAGOGASTRODUODENOSCOPY (EGD) WITH PROPOFOL Left 09/08/2015   Procedure: ESOPHAGOGASTRODUODENOSCOPY (EGD) WITH PROPOFOL;  Surgeon: Clarene Essex, MD;  Location: [facility] ENDOSCOPY;  Service: Endoscopy;  Laterality: Left;  . ESOPHAGOGASTRODUODENOSCOPY (EGD) WITH PROPOFOL N/A 08/18/2017   Procedure: ESOPHAGOGASTRODUODENOSCOPY (EGD) WITH PROPOFOL;  Surgeon: Clarene Essex, MD;  Location: [place];  Service: Endoscopy;  Laterality: N/A;  with flouroscopy  . IR  FLUORO GUIDE CV LINE RIGHT  08/25/2017  . IR FLUORO GUIDE CV LINE RIGHT  08/29/2017  . IR US GUIDE VASC ACCESS RIGHT  08/25/2017  . LOWER EXTREMITY ANGIOGRAPHY Right 05/24/2018   Procedure: Lower Extremity Angiography;  Surgeon: [name], MD;  Location: [place] CV LAB;  Service: Cardiovascular;  Laterality: Right;  . PERIPHERAL VASCULAR ATHERECTOMY Right 05/24/2018   Procedure: PERIPHERAL VASCULAR ATHERECTOMY;  Surgeon: [name], MD;  Location: [place] CV LAB;  Service: Cardiovascular;   Laterality: Right;  Anterial Tibial  . RADIOACTIVE SEED GUIDED PARTIAL MASTECTOMY WITH AXILLARY SENTINEL LYMPH NODE BIOPSY Right 07/17/2015   Procedure: RADIOACTIVE SEED GUIDED PARTIAL MASTECTOMY WITH AXILLARY SENTINEL LYMPH NODE BIOPSY;  Surgeon: Erroll Luna, MD;  Location: [place];  Service: General;  Laterality: Right;    Family History  Problem Relation Age of Onset  . Diabetes Mother   . Colon cancer Father        also had lung  . Diabetes Other   . Heart attack Other   . Diabetes Sister        [name]  . Breast cancer Sister     SOCIAL HISTORY: Social History   Tobacco Use  . Smoking status: Never Smoker  . Smokeless tobacco: Never Used  Substance Use Topics  . Alcohol use: No    Alcohol/week: 0.0 standard drinks    No Known Allergies  Current Outpatient Medications  Medication Sig Dispense Refill  . acetaminophen (TYLENOL) 650 MG CR tablet Take 1,300 mg by mouth every 8 (eight) hours as needed for pain.    Marland Kitchen aspirin 81 MG EC tablet Take 1 tablet (81 mg total) by mouth every evening. 30 tablet   . b complex-vitamin c-folic acid (NEPHRO-VITE) 0.8 MG TABS tablet Take 1 tablet by mouth every evening.    . calcium acetate (PHOSLO) 667 MG capsule Take 667 mg by mouth 3 (three) times daily with meals.    . citalopram (CELEXA) 20 MG tablet Take 20 mg by mouth every evening.     . cloNIDine (CATAPRES) 0.1 MG tablet Take 0.1 mg by mouth See admin instructions. Take 0.1 mg by mouth in the evening and 0.1 mg by mouth at bedtime    . clopidogrel (PLAVIX) 75 MG tablet Take 1 tablet (75 mg total) by mouth daily. 90 tablet 1  . donepezil (ARICEPT) 5 MG tablet Take 5 mg by mouth at bedtime.    . famotidine (PEPCID AC) 10 MG chewable tablet Chew 10 mg by mouth daily as needed for heartburn.    Marland Kitchen HUMALOG MIX 50/50 KWIKPEN (50-50) 100 UNIT/ML Kwikpen Inject 14-20 Units into the skin See admin instructions. Inject 20 units in the morning and 14 units at evening; hold for blood sugar readings  lower than 200  11  . isosorbide mononitrate (IMDUR) 30 MG 24 hr tablet Take 1 tablet (30 mg total) daily by mouth. (Patient taking differently: Take 30 mg by mouth every evening. ) 30 tablet 0  . metoprolol succinate (TOPROL-XL) 25 MG 24 hr tablet Take 1 tablet (25 mg total) by mouth daily. (Patient taking differently: Take 12.5 mg by mouth every evening. ) 30 tablet 0  . oxyCODONE-acetaminophen (PERCOCET/ROXICET) 5-325 MG tablet Take 1-2 tablets by mouth every 6 (six) hours as needed for severe pain. 15 tablet 0  . pantoprazole (PROTONIX) 20 MG tablet Take 20 mg by mouth every evening.     . pravastatin (PRAVACHOL) 80 MG tablet Take 80 mg by mouth  at bedtime.      No current facility-administered medications for this visit.     REVIEW OF SYSTEMS:  [X]  denotes positive finding, [ ]  denotes negative finding Cardiac  Comments:  Chest pain or chest pressure:    Shortness of breath upon exertion:    Short of breath when lying flat:    Irregular heart rhythm:        Vascular    Pain in calf, thigh, or hip brought on by ambulation:    Pain in feet at night that wakes you up from your sleep:     Blood clot in your veins:    Leg swelling:         Pulmonary    Oxygen at home:    Productive cough:     Wheezing:         Neurologic    Sudden weakness in arms or legs:     Sudden numbness in arms or legs:     Sudden onset of difficulty speaking or slurred speech:    Temporary loss of vision in one eye:     Problems with dizziness:         Gastrointestinal    Blood in stool:     Vomited blood:         Genitourinary    Burning when urinating:     Blood in urine:        Psychiatric    Major depression:         Hematologic    Bleeding problems:    Problems with blood clotting too easily:        Skin    Rashes or ulcers:        Constitutional    Fever or chills:      PHYSICAL EXAM: Vitals:   06/20/18 1059  BP: (!) 159/55  Pulse: 71  Resp: 16  Temp: 98.3 F (36.8 C)    SpO2: 100%  Weight: 130 lb (59 kg)  Height: 5\' 6"  (1.676 m)    GENERAL: The patient is a well-nourished female, in no acute distress. The vital signs are documented above. CARDIAC: There is a regular rate and rhythm.  VASCULAR:  Left BKA with ulcerated segment on medial side Right great toe dry gangrene, palpable right DP Palpable femoral pulses bilaterally New right heel ulcer - overlying skin intact PULMONARY: There is good air exchange bilaterally without wheezing or rales. ABDOMEN: Soft and non-tender with normal pitched bowel sounds.  MUSCULOSKELETAL: There are no major deformities or cyanosis. NEUROLOGIC: No focal weakness or paresthesias are detected.  DATA:   I independently reviewed her right leg arterial duplex and right AT patent with biphasic signal and no stenosis after atherectomy  Assessment/Plan:  85 year old female with bilateral lower extremity critical limb ischemia with tissue loss.  She has most recently undergone a left BKA on 05/26/2018 as well as a right AT atherectomy and angioplasty on 05/24/2018 for critical ischemia with tissue loss of the right great toe.  On follow-up today her left BKA has a small necrotic area medially that I discussed with family is concerning for non-healing.  For now I removed every other staple from her BKA and I will have her follow-up again next week to watch this necrotic area closely.  My concern is if I take out all her staples today I was worried that the wound may fall apart based on my evaluation.  We will put her in Betadine paint to the right great  toe and that limb has undergone revascularization and she has a palpable dorsalis pedis.  Unfortunately she is developed a heel wound on the right heel and I have recommended a heel protector and have sent a prescription with the family for biotech heel protector.  Will see her again next week for close followup of L BKA.   [name], MD [facility]  [place] Office: [phone] Pager: [phone]

## 2018-06-27 ENCOUNTER — Other Ambulatory Visit: Payer: Self-pay

## 2018-06-27 ENCOUNTER — Encounter: Payer: Self-pay | Admitting: Vascular Surgery

## 2018-06-27 ENCOUNTER — Ambulatory Visit (INDEPENDENT_AMBULATORY_CARE_PROVIDER_SITE_OTHER): Payer: Medicare (Managed Care) | Admitting: Vascular Surgery

## 2018-06-27 VITALS — BP 155/54 | HR 85 | Resp 18 | Ht 66.0 in | Wt 130.0 lb

## 2018-06-27 DIAGNOSIS — I739 Peripheral vascular disease, unspecified: Secondary | ICD-10-CM

## 2018-06-27 NOTE — Progress Notes (Signed)
Patient name: Glenda [name] MRN: [id number] DOB: 04/30/1933 Sex: female  REASON FOR VISIT: follow-up after L BKA and right leg revascularization  HPI: Glenda [name] is a 85 y.o. female with multiple medical problems that presents for interval follow-up and ongoing wound check.  She underwent a right lower extremity revascularization with an AT atherectomy and angioplasty for critical limb ischemia and tissue loss of the right great toe on 05/24/2018. She also underwent a left BKA on 05/26/2018 for critical limb ischemia with tissue loss.   Concerned last week that left BKA is not healing.  Also had developed right heel ulcer.    Past Medical History:  Diagnosis Date  . Arthritis    patient denies  . Breast cancer ([place])    Breast Cancer right   treated with radiation  . CAD (coronary artery disease)   . Chest pain   . CHF (congestive heart failure) ([place])   . Dizziness   . DM (diabetes mellitus) ([place])    TYpe II  . ESRD (end stage renal disease) ([place])    TTHSAT- [facility] [address]  . GERD (gastroesophageal reflux disease)   . HLD (hyperlipidemia)   . HTN (hypertension)   . Obesity   . PAF (paroxysmal atrial fibrillation) ([place])   . Radiation 09/11/15-10/13/15   42.72 Gy to right breast, 12 Gy boost to right breast  . Shortness of breath dyspnea    04/27/2018- not so much since she began dialysis  . Sleep apnea    no longer uses cpap   . Stroke [facility])    balance issues - walks with walker and uses wheelchair  Had 3 in 2017- last 1 was in October  . UTI (urinary tract infection)     Past Surgical History:  Procedure Laterality Date  . ABDOMINAL AORTOGRAM W/LOWER EXTREMITY N/A 04/27/2018   Procedure: ABDOMINAL AORTOGRAM W/LOWER EXTREMITY;  Surgeon: [name], MD;  Location: [place] CV LAB;  Service: Cardiovascular;  Laterality: N/A;  . AMPUTATION Left 04/28/2018   Procedure: AMPUTATION RAY LEFT;  Surgeon: [name], MD;  Location: [place];  Service: Vascular;   Laterality: Left;  . AMPUTATION Left 05/26/2018   Procedure: AMPUTATION BELOW KNEE, LEFT;  Surgeon: [name], MD;  Location: [place];  Service: Vascular;  Laterality: Left;  . AV FISTULA PLACEMENT Left 07/14/2017   Procedure: Left arm Brachicephalic Fistula Creation;  Surgeon: Serafina Mitchell, MD;  Location: [place];  Service: Vascular;  Laterality: Left;  . BALLOON DILATION N/A 08/18/2017   Procedure: BALLOON DILATION;  Surgeon: Clarene Essex, MD;  Location: [place];  Service: Endoscopy;  Laterality: N/A;  . BASCILIC VEIN TRANSPOSITION Left 10/12/2017   Procedure: SECOND STAGE BASILIC VEIN TRANSPOSITION LEFT ARM;  Surgeon: Serafina Mitchell, MD;  Location: [place];  Service: Vascular;  Laterality: Left;  . CHOLECYSTECTOMY    . colon polyps; hx    . CORONARY ANGIOPLASTY  02/14/09  . ESOPHAGOGASTRODUODENOSCOPY (EGD) WITH PROPOFOL Left 09/08/2015   Procedure: ESOPHAGOGASTRODUODENOSCOPY (EGD) WITH PROPOFOL;  Surgeon: Clarene Essex, MD;  Location: [facility] ENDOSCOPY;  Service: Endoscopy;  Laterality: Left;  . ESOPHAGOGASTRODUODENOSCOPY (EGD) WITH PROPOFOL N/A 08/18/2017   Procedure: ESOPHAGOGASTRODUODENOSCOPY (EGD) WITH PROPOFOL;  Surgeon: Clarene Essex, MD;  Location: [place];  Service: Endoscopy;  Laterality: N/A;  with flouroscopy  . IR FLUORO GUIDE CV LINE RIGHT  08/25/2017  . IR FLUORO GUIDE CV LINE RIGHT  08/29/2017  . IR US GUIDE VASC ACCESS RIGHT  08/25/2017  . LOWER EXTREMITY  ANGIOGRAPHY Right 05/24/2018   Procedure: Lower Extremity Angiography;  Surgeon: [name], MD;  Location: [place] CV LAB;  Service: Cardiovascular;  Laterality: Right;  . PERIPHERAL VASCULAR ATHERECTOMY Right 05/24/2018   Procedure: PERIPHERAL VASCULAR ATHERECTOMY;  Surgeon: [name], MD;  Location: [place] CV LAB;  Service: Cardiovascular;  Laterality: Right;  Anterial Tibial  . RADIOACTIVE SEED GUIDED PARTIAL MASTECTOMY WITH AXILLARY SENTINEL LYMPH NODE BIOPSY Right 07/17/2015   Procedure:  RADIOACTIVE SEED GUIDED PARTIAL MASTECTOMY WITH AXILLARY SENTINEL LYMPH NODE BIOPSY;  Surgeon: Erroll Luna, MD;  Location: [place];  Service: General;  Laterality: Right;    Family History  Problem Relation Age of Onset  . Diabetes Mother   . Colon cancer Father        also had lung  . Diabetes Other   . Heart attack Other   . Diabetes Sister        [name]  . Breast cancer Sister     SOCIAL HISTORY: Social History   Tobacco Use  . Smoking status: Never Smoker  . Smokeless tobacco: Never Used  Substance Use Topics  . Alcohol use: No    Alcohol/week: 0.0 standard drinks    No Known Allergies  Current Outpatient Medications  Medication Sig Dispense Refill  . acetaminophen (TYLENOL) 650 MG CR tablet Take 1,300 mg by mouth every 8 (eight) hours as needed for pain.    Marland Kitchen aspirin 81 MG EC tablet Take 1 tablet (81 mg total) by mouth every evening. 30 tablet   . b complex-vitamin c-folic acid (NEPHRO-VITE) 0.8 MG TABS tablet Take 1 tablet by mouth every evening.    . calcium acetate (PHOSLO) 667 MG capsule Take 667 mg by mouth 3 (three) times daily with meals.    . citalopram (CELEXA) 20 MG tablet Take 20 mg by mouth every evening.     . cloNIDine (CATAPRES) 0.1 MG tablet Take 0.1 mg by mouth See admin instructions. Take 0.1 mg by mouth in the evening and 0.1 mg by mouth at bedtime    . clopidogrel (PLAVIX) 75 MG tablet Take 1 tablet (75 mg total) by mouth daily. 90 tablet 1  . donepezil (ARICEPT) 5 MG tablet Take 5 mg by mouth at bedtime.    . famotidine (PEPCID AC) 10 MG chewable tablet Chew 10 mg by mouth daily as needed for heartburn.    Marland Kitchen HUMALOG MIX 50/50 KWIKPEN (50-50) 100 UNIT/ML Kwikpen Inject 14-20 Units into the skin See admin instructions. Inject 20 units in the morning and 14 units at evening; hold for blood sugar readings lower than 200  11  . isosorbide mononitrate (IMDUR) 30 MG 24 hr tablet Take 1 tablet (30 mg total) daily by mouth. (Patient taking differently: Take 30  mg by mouth every evening. ) 30 tablet 0  . metoprolol succinate (TOPROL-XL) 25 MG 24 hr tablet Take 1 tablet (25 mg total) by mouth daily. (Patient taking differently: Take 12.5 mg by mouth every evening. ) 30 tablet 0  . oxyCODONE-acetaminophen (PERCOCET/ROXICET) 5-325 MG tablet Take 1-2 tablets by mouth every 6 (six) hours as needed for severe pain. 15 tablet 0  . pantoprazole (PROTONIX) 20 MG tablet Take 20 mg by mouth every evening.     . pravastatin (PRAVACHOL) 80 MG tablet Take 80 mg by mouth at bedtime.      No current facility-administered medications for this visit.     REVIEW OF SYSTEMS:  [X]  denotes positive finding, [ ]  denotes negative finding  Cardiac  Comments:  Chest pain or chest pressure:    Shortness of breath upon exertion:    Short of breath when lying flat:    Irregular heart rhythm:        Vascular    Pain in calf, thigh, or hip brought on by ambulation:    Pain in feet at night that wakes you up from your sleep:     Blood clot in your veins:    Leg swelling:         Pulmonary    Oxygen at home:    Productive cough:     Wheezing:         Neurologic    Sudden weakness in arms or legs:     Sudden numbness in arms or legs:     Sudden onset of difficulty speaking or slurred speech:    Temporary loss of vision in one eye:     Problems with dizziness:         Gastrointestinal    Blood in stool:     Vomited blood:         Genitourinary    Burning when urinating:     Blood in urine:        Psychiatric    Major depression:         Hematologic    Bleeding problems:    Problems with blood clotting too easily:        Skin    Rashes or ulcers:        Constitutional    Fever or chills:      PHYSICAL EXAM: Vitals:   06/27/18 1612  BP: (!) 155/54  Pulse: 85  Resp: 18  SpO2: 96%  Weight: 130 lb (59 kg)  Height: 5\' 6"  (1.676 m)    GENERAL: The patient is a well-nourished female, in no acute distress. The vital signs are documented  above. CARDIAC: There is a regular rate and rhythm.  VASCULAR:  Left BKA with ulcerated segment on medial side  Right great toe dry gangrene, palpable right DP, right heel ulcer Palpable femoral pulses bilaterally PULMONARY: There is good air exchange bilaterally without wheezing or rales. ABDOMEN: Soft and non-tender with normal pitched bowel sounds.  MUSCULOSKELETAL: There are no major deformities or cyanosis. NEUROLOGIC: No focal weakness or paresthesias are detected.  DATA:   I independently reviewed her right leg arterial duplex and right AT patent with biphasic signal and no stenosis after atherectomy  Assessment/Plan:  85 year old female with bilateral lower extremity critical limb ischemia with tissue loss.  She has most recently undergone a left BKA on 05/26/2018 as well as a right AT atherectomy and angioplasty on 05/24/2018 for critical ischemia with tissue loss of the right great toe.    Remaining staples from left BKA removed today.  Ongoing discussion with family about non-healing BKA with necrotic segment on medial side of incision.  Offered AKA today.  Ms. Kjos does not want anymore surgery today.  She wants to continue close interval follow-up and discussed my concern for continued wound breakdown, infection, etc..   Continue Betadine paint to the right great toe that has undergone revascularization and she has a palpable dorsalis pedis.  Continue heel protector to right foot at night given new heel wound (pressure ulcer).    [name], MD [facility] [place] Office: [phone] Pager: [place]

## 2018-06-29 ENCOUNTER — Encounter (HOSPITAL_COMMUNITY): Payer: Self-pay | Admitting: *Deleted

## 2018-06-29 ENCOUNTER — Other Ambulatory Visit: Payer: Self-pay

## 2018-06-29 MED ORDER — ALTEPLASE 2 MG IJ SOLR
2.0000 mg | Freq: Once | INTRAMUSCULAR | Status: DC | PRN
  Filled 2018-06-29: qty 2

## 2018-06-29 NOTE — Progress Notes (Addendum)
I spoke with Mrs. Fara Boros daughter.  Patient denies chest pain or shortness of breath.  Mrs Jaspers states that CBGs run 96- 110.  I instructed patienT to not take any medications in am. I instructed patient to check CBG after awaking and every 2 hours until arrival  to the hospital.  I Instructed patient if CBG is less than 70 to take1 tube of Glucose Gel. Recheck CBG in 15 minutes then call pre- op desk at [phone] for further instructions.   Ms Niblack's medications are in blister packs prepared by Claudia Desanctis, I instructed patient to not take any medication IN AM.

## 2018-06-30 ENCOUNTER — Other Ambulatory Visit: Payer: Self-pay

## 2018-06-30 ENCOUNTER — Inpatient Hospital Stay (HOSPITAL_COMMUNITY): Payer: Medicare (Managed Care) | Admitting: Anesthesiology

## 2018-06-30 ENCOUNTER — Encounter (HOSPITAL_COMMUNITY)
Admission: RE | Disposition: A | Discharge status: Home Health | Payer: Self-pay | Source: Home / Self Care | Attending: Student in an Organized Health Care Education/Training Program

## 2018-06-30 ENCOUNTER — Encounter (HOSPITAL_COMMUNITY): Payer: Self-pay | Admitting: *Deleted

## 2018-06-30 ENCOUNTER — Inpatient Hospital Stay (HOSPITAL_COMMUNITY)
Admission: RE | Admit: 2018-06-30 | Discharge: 2018-07-04 | DRG: 474 | Disposition: A | Discharge status: Home Health | Payer: Medicare (Managed Care) | Attending: Student in an Organized Health Care Education/Training Program | Admitting: Student in an Organized Health Care Education/Training Program

## 2018-06-30 DIAGNOSIS — I10 Essential (primary) hypertension: Secondary | ICD-10-CM | POA: Diagnosis present

## 2018-06-30 DIAGNOSIS — Z923 Personal history of irradiation: Secondary | ICD-10-CM

## 2018-06-30 DIAGNOSIS — Z853 Personal history of malignant neoplasm of breast: Secondary | ICD-10-CM

## 2018-06-30 DIAGNOSIS — E785 Hyperlipidemia, unspecified: Secondary | ICD-10-CM | POA: Diagnosis present

## 2018-06-30 DIAGNOSIS — E1065 Type 1 diabetes mellitus with hyperglycemia: Secondary | ICD-10-CM | POA: Diagnosis present

## 2018-06-30 DIAGNOSIS — L8990 Pressure ulcer of unspecified site, unspecified stage: Secondary | ICD-10-CM

## 2018-06-30 DIAGNOSIS — F0390 Unspecified dementia without behavioral disturbance: Secondary | ICD-10-CM | POA: Diagnosis present

## 2018-06-30 DIAGNOSIS — D631 Anemia in chronic kidney disease: Secondary | ICD-10-CM | POA: Diagnosis present

## 2018-06-30 DIAGNOSIS — Z79899 Other long term (current) drug therapy: Secondary | ICD-10-CM

## 2018-06-30 DIAGNOSIS — I5033 Acute on chronic diastolic (congestive) heart failure: Secondary | ICD-10-CM | POA: Diagnosis present

## 2018-06-30 DIAGNOSIS — Y835 Amputation of limb(s) as the cause of abnormal reaction of the patient, or of later complication, without mention of misadventure at the time of the procedure: Secondary | ICD-10-CM | POA: Diagnosis present

## 2018-06-30 DIAGNOSIS — I5032 Chronic diastolic (congestive) heart failure: Secondary | ICD-10-CM | POA: Diagnosis present

## 2018-06-30 DIAGNOSIS — G4730 Sleep apnea, unspecified: Secondary | ICD-10-CM | POA: Diagnosis present

## 2018-06-30 DIAGNOSIS — Z8673 Personal history of transient ischemic attack (TIA), and cerebral infarction without residual deficits: Secondary | ICD-10-CM | POA: Diagnosis not present

## 2018-06-30 DIAGNOSIS — E1051 Type 1 diabetes mellitus with diabetic peripheral angiopathy without gangrene: Secondary | ICD-10-CM | POA: Diagnosis present

## 2018-06-30 DIAGNOSIS — E1022 Type 1 diabetes mellitus with diabetic chronic kidney disease: Secondary | ICD-10-CM | POA: Diagnosis present

## 2018-06-30 DIAGNOSIS — K219 Gastro-esophageal reflux disease without esophagitis: Secondary | ICD-10-CM | POA: Diagnosis present

## 2018-06-30 DIAGNOSIS — I132 Hypertensive heart and chronic kidney disease with heart failure and with stage 5 chronic kidney disease, or end stage renal disease: Secondary | ICD-10-CM | POA: Diagnosis present

## 2018-06-30 DIAGNOSIS — Z89512 Acquired absence of left leg below knee: Secondary | ICD-10-CM | POA: Diagnosis not present

## 2018-06-30 DIAGNOSIS — T8789 Other complications of amputation stump: Secondary | ICD-10-CM | POA: Diagnosis present

## 2018-06-30 DIAGNOSIS — Z992 Dependence on renal dialysis: Secondary | ICD-10-CM | POA: Diagnosis not present

## 2018-06-30 DIAGNOSIS — Z6821 Body mass index (BMI) 21.0-21.9, adult: Secondary | ICD-10-CM

## 2018-06-30 DIAGNOSIS — I959 Hypotension, unspecified: Secondary | ICD-10-CM | POA: Diagnosis not present

## 2018-06-30 DIAGNOSIS — I480 Paroxysmal atrial fibrillation: Secondary | ICD-10-CM | POA: Diagnosis present

## 2018-06-30 DIAGNOSIS — Z833 Family history of diabetes mellitus: Secondary | ICD-10-CM

## 2018-06-30 DIAGNOSIS — Z7902 Long term (current) use of antithrombotics/antiplatelets: Secondary | ICD-10-CM | POA: Diagnosis not present

## 2018-06-30 DIAGNOSIS — Z89612 Acquired absence of left leg above knee: Secondary | ICD-10-CM | POA: Diagnosis not present

## 2018-06-30 DIAGNOSIS — Z9049 Acquired absence of other specified parts of digestive tract: Secondary | ICD-10-CM

## 2018-06-30 DIAGNOSIS — I2510 Atherosclerotic heart disease of native coronary artery without angina pectoris: Secondary | ICD-10-CM | POA: Diagnosis present

## 2018-06-30 DIAGNOSIS — Z794 Long term (current) use of insulin: Secondary | ICD-10-CM

## 2018-06-30 DIAGNOSIS — L97419 Non-pressure chronic ulcer of right heel and midfoot with unspecified severity: Secondary | ICD-10-CM | POA: Diagnosis present

## 2018-06-30 DIAGNOSIS — T8781 Dehiscence of amputation stump: Secondary | ICD-10-CM

## 2018-06-30 DIAGNOSIS — Z803 Family history of malignant neoplasm of breast: Secondary | ICD-10-CM

## 2018-06-30 DIAGNOSIS — N186 End stage renal disease: Secondary | ICD-10-CM | POA: Diagnosis present

## 2018-06-30 DIAGNOSIS — E669 Obesity, unspecified: Secondary | ICD-10-CM | POA: Diagnosis present

## 2018-06-30 DIAGNOSIS — Z8679 Personal history of other diseases of the circulatory system: Secondary | ICD-10-CM | POA: Diagnosis not present

## 2018-06-30 DIAGNOSIS — Z7982 Long term (current) use of aspirin: Secondary | ICD-10-CM

## 2018-06-30 DIAGNOSIS — E10649 Type 1 diabetes mellitus with hypoglycemia without coma: Secondary | ICD-10-CM | POA: Diagnosis not present

## 2018-06-30 DIAGNOSIS — T879 Unspecified complications of amputation stump: Secondary | ICD-10-CM | POA: Diagnosis present

## 2018-06-30 DIAGNOSIS — IMO0002 Reserved for concepts with insufficient information to code with codable children: Secondary | ICD-10-CM | POA: Diagnosis present

## 2018-06-30 DIAGNOSIS — Z9011 Acquired absence of right breast and nipple: Secondary | ICD-10-CM

## 2018-06-30 DIAGNOSIS — Z8249 Family history of ischemic heart disease and other diseases of the circulatory system: Secondary | ICD-10-CM

## 2018-06-30 DIAGNOSIS — I739 Peripheral vascular disease, unspecified: Secondary | ICD-10-CM | POA: Diagnosis present

## 2018-06-30 DIAGNOSIS — Z9862 Peripheral vascular angioplasty status: Secondary | ICD-10-CM | POA: Diagnosis not present

## 2018-06-30 HISTORY — PX: AMPUTATION: SHX166

## 2018-06-30 HISTORY — DX: Constipation, unspecified: K59.00

## 2018-06-30 LAB — CBC
HCT: 31.5 % — ABNORMAL LOW (ref 36.0–46.0)
Hemoglobin: 9.4 g/dL — ABNORMAL LOW (ref 12.0–15.0)
MCH: 28.7 pg (ref 26.0–34.0)
MCHC: 29.8 g/dL — ABNORMAL LOW (ref 30.0–36.0)
MCV: 96.3 fL (ref 80.0–100.0)
Platelets: 265 10*3/uL (ref 150–400)
RBC: 3.27 MIL/uL — ABNORMAL LOW (ref 3.87–5.11)
RDW: 18.6 % — ABNORMAL HIGH (ref 11.5–15.5)
WBC: 18.8 10*3/uL — ABNORMAL HIGH (ref 4.0–10.5)
nRBC: 0 % (ref 0.0–0.2)

## 2018-06-30 LAB — GLUCOSE, CAPILLARY
Glucose-Capillary: 137 mg/dL — ABNORMAL HIGH (ref 70–99)
Glucose-Capillary: 112 mg/dL — ABNORMAL HIGH (ref 70–99)
Glucose-Capillary: 124 mg/dL — ABNORMAL HIGH (ref 70–99)
Glucose-Capillary: 108 mg/dL — ABNORMAL HIGH (ref 70–99)

## 2018-06-30 LAB — BASIC METABOLIC PANEL
Anion gap: 12 (ref 5–15)
BUN: 31 mg/dL — AB (ref 8–23)
CO2: 26 mmol/L (ref 22–32)
Calcium: 9.2 mg/dL (ref 8.9–10.3)
Chloride: 102 mmol/L (ref 98–111)
Creatinine, Ser: 4.24 mg/dL — ABNORMAL HIGH (ref 0.44–1.00)
GFR calc Af Amer: 11 mL/min — ABNORMAL LOW (ref >60)
GFR calc non Af Amer: 9 mL/min — ABNORMAL LOW (ref >60)
GLUCOSE: 136 mg/dL — AB (ref 70–99)
Potassium: 3.3 mmol/L — ABNORMAL LOW (ref 3.5–5.1)
SODIUM: 140 mmol/L (ref 135–145)

## 2018-06-30 LAB — HEMOGLOBIN A1C
HEMOGLOBIN A1C: 6.4 % — AB (ref 4.8–5.6)
Mean Plasma Glucose: 136.98 mg/dL

## 2018-06-30 LAB — POCT I-STAT 4, (NA,K, GLUC, HGB,HCT)
Glucose, Bld: 132 mg/dL — ABNORMAL HIGH (ref 70–99)
HCT: 31 % — ABNORMAL LOW (ref 36.0–46.0)
Hemoglobin: 10.5 g/dL — ABNORMAL LOW (ref 12.0–15.0)
Potassium: 4.1 mmol/L (ref 3.5–5.1)
Sodium: 138 mmol/L (ref 135–145)

## 2018-06-30 SURGERY — AMPUTATION, ABOVE KNEE
Anesthesia: General | Site: Leg Upper | Laterality: Left

## 2018-06-30 MED ORDER — FENTANYL CITRATE (PF) 250 MCG/5ML IJ SOLN
INTRAMUSCULAR | Status: DC | PRN
  Administered 2018-06-30 (×4): 25 ug via INTRAVENOUS

## 2018-06-30 MED ORDER — DONEPEZIL HCL 5 MG PO TABS
5.0000 mg | ORAL_TABLET | Freq: Every day | ORAL | Status: DC
  Administered 2018-06-30 – 2018-07-03 (×4): 5 mg via ORAL
  Filled 2018-06-30 (×4): qty 1

## 2018-06-30 MED ORDER — FENTANYL CITRATE (PF) 250 MCG/5ML IJ SOLN
INTRAMUSCULAR | Status: AC
  Filled 2018-06-30: qty 5

## 2018-06-30 MED ORDER — CITALOPRAM HYDROBROMIDE 20 MG PO TABS
20.0000 mg | ORAL_TABLET | Freq: Every evening | ORAL | Status: DC
  Administered 2018-06-30 – 2018-07-03 (×4): 20 mg via ORAL
  Filled 2018-06-30 (×6): qty 1

## 2018-06-30 MED ORDER — FAMOTIDINE 10 MG PO TABS
10.0000 mg | ORAL_TABLET | Freq: Every day | ORAL | Status: DC | PRN
  Administered 2018-06-30 – 2018-07-01 (×2): 10 mg via ORAL
  Filled 2018-06-30 (×4): qty 1

## 2018-06-30 MED ORDER — HYDROMORPHONE HCL 1 MG/ML IJ SOLN
0.5000 mg | INTRAMUSCULAR | Status: DC | PRN
  Administered 2018-07-01 – 2018-07-04 (×11): 0.5 mg via INTRAVENOUS
  Filled 2018-06-30 (×10): qty 1

## 2018-06-30 MED ORDER — SODIUM CHLORIDE 0.9 % IV SOLN
INTRAVENOUS | Status: DC
  Administered 2018-06-30: 11:00:00 via INTRAVENOUS

## 2018-06-30 MED ORDER — CLOPIDOGREL BISULFATE 75 MG PO TABS
75.0000 mg | ORAL_TABLET | Freq: Every day | ORAL | Status: DC
  Administered 2018-06-30 – 2018-07-04 (×5): 75 mg via ORAL
  Filled 2018-06-30 (×5): qty 1

## 2018-06-30 MED ORDER — PANTOPRAZOLE SODIUM 20 MG PO TBEC
20.0000 mg | DELAYED_RELEASE_TABLET | Freq: Every evening | ORAL | Status: DC
  Administered 2018-06-30 – 2018-07-03 (×5): 20 mg via ORAL
  Filled 2018-06-30 (×6): qty 1

## 2018-06-30 MED ORDER — FENTANYL CITRATE (PF) 100 MCG/2ML IJ SOLN: 25.0000 ug | INTRAMUSCULAR | Status: DC | PRN

## 2018-06-30 MED ORDER — SENNOSIDES-DOCUSATE SODIUM 8.6-50 MG PO TABS
1.0000 | ORAL_TABLET | Freq: Every day | ORAL | Status: DC
  Administered 2018-06-30 – 2018-07-01 (×2): 1 via ORAL
  Filled 2018-06-30 (×2): qty 1

## 2018-06-30 MED ORDER — ACETAMINOPHEN 325 MG PO TABS
650.0000 mg | ORAL_TABLET | Freq: Four times a day (QID) | ORAL | Status: DC | PRN
  Administered 2018-07-01 – 2018-07-04 (×7): 650 mg via ORAL
  Filled 2018-06-30 (×8): qty 2

## 2018-06-30 MED ORDER — ONDANSETRON HCL 4 MG/2ML IJ SOLN: 4.0000 mg | Freq: Once | INTRAMUSCULAR | Status: DC | PRN

## 2018-06-30 MED ORDER — CLONIDINE HCL 0.1 MG PO TABS
0.1000 mg | ORAL_TABLET | ORAL | Status: DC
  Administered 2018-06-30 (×2): 0.1 mg via ORAL
  Filled 2018-06-30 (×2): qty 1

## 2018-06-30 MED ORDER — CLONIDINE HCL 0.1 MG PO TABS: 0.1000 mg | ORAL_TABLET | ORAL | Status: DC

## 2018-06-30 MED ORDER — INSULIN ASPART 100 UNIT/ML ~~LOC~~ SOLN
0.0000 [IU] | Freq: Three times a day (TID) | SUBCUTANEOUS | Status: DC
  Administered 2018-06-30: 2 [IU] via SUBCUTANEOUS
  Administered 2018-07-02: 5 [IU] via SUBCUTANEOUS
  Administered 2018-07-02: 3 [IU] via SUBCUTANEOUS

## 2018-06-30 MED ORDER — INSULIN GLARGINE 100 UNIT/ML ~~LOC~~ SOLN
10.0000 [IU] | Freq: Every day | SUBCUTANEOUS | Status: DC
  Administered 2018-07-01 – 2018-07-02 (×2): 10 [IU] via SUBCUTANEOUS
  Filled 2018-06-30 (×3): qty 0.1

## 2018-06-30 MED ORDER — SODIUM CHLORIDE 0.9 % IV SOLN
INTRAVENOUS | Status: DC | PRN
  Administered 2018-06-30: 25 ug/min via INTRAVENOUS

## 2018-06-30 MED ORDER — 0.9 % SODIUM CHLORIDE (POUR BTL) OPTIME
TOPICAL | Status: DC | PRN
  Administered 2018-06-30: 1000 mL

## 2018-06-30 MED ORDER — CEFAZOLIN SODIUM-DEXTROSE 2-4 GM/100ML-% IV SOLN
INTRAVENOUS | Status: AC
  Filled 2018-06-30: qty 100

## 2018-06-30 MED ORDER — MEPERIDINE HCL 50 MG/ML IJ SOLN: 6.2500 mg | INTRAMUSCULAR | Status: DC | PRN

## 2018-06-30 MED ORDER — OXYCODONE-ACETAMINOPHEN 5-325 MG PO TABS
1.0000 | ORAL_TABLET | Freq: Four times a day (QID) | ORAL | Status: DC | PRN
  Administered 2018-06-30 – 2018-07-01 (×2): 2 via ORAL
  Filled 2018-06-30 (×2): qty 2

## 2018-06-30 MED ORDER — ASPIRIN EC 81 MG PO TBEC
81.0000 mg | DELAYED_RELEASE_TABLET | Freq: Every evening | ORAL | Status: DC
  Administered 2018-06-30 – 2018-07-03 (×4): 81 mg via ORAL
  Filled 2018-06-30 (×4): qty 1

## 2018-06-30 MED ORDER — PRAVASTATIN SODIUM 40 MG PO TABS
80.0000 mg | ORAL_TABLET | Freq: Every day | ORAL | Status: DC
  Administered 2018-06-30 – 2018-07-03 (×4): 80 mg via ORAL
  Filled 2018-06-30 (×4): qty 2

## 2018-06-30 MED ORDER — ISOSORBIDE MONONITRATE ER 30 MG PO TB24
30.0000 mg | ORAL_TABLET | Freq: Every evening | ORAL | Status: DC
  Administered 2018-06-30: 30 mg via ORAL
  Filled 2018-06-30: qty 1

## 2018-06-30 MED ORDER — CHLORHEXIDINE GLUCONATE CLOTH 2 % EX PADS
6.0000 | MEDICATED_PAD | Freq: Every day | CUTANEOUS | Status: DC
  Administered 2018-07-01 – 2018-07-03 (×2): 6 via TOPICAL

## 2018-06-30 MED ORDER — ACETAMINOPHEN 650 MG RE SUPP: 650.0000 mg | Freq: Four times a day (QID) | RECTAL | Status: DC | PRN

## 2018-06-30 MED ORDER — CEFAZOLIN SODIUM-DEXTROSE 2-4 GM/100ML-% IV SOLN
2.0000 g | Freq: Once | INTRAVENOUS | Status: AC
  Administered 2018-06-30: 2 g via INTRAVENOUS

## 2018-06-30 MED ORDER — METOPROLOL SUCCINATE ER 25 MG PO TB24
12.5000 mg | ORAL_TABLET | Freq: Every evening | ORAL | Status: DC
  Administered 2018-06-30: 12.5 mg via ORAL
  Filled 2018-06-30: qty 1

## 2018-06-30 MED ORDER — RENA-VITE PO TABS
1.0000 | ORAL_TABLET | Freq: Every day | ORAL | Status: DC
  Administered 2018-06-30 – 2018-07-03 (×4): 1 via ORAL
  Filled 2018-06-30 (×4): qty 1

## 2018-06-30 MED ORDER — LIDOCAINE HCL (CARDIAC) PF 100 MG/5ML IV SOSY
PREFILLED_SYRINGE | INTRAVENOUS | Status: DC | PRN
  Administered 2018-06-30: 70 mg via INTRAVENOUS
  Administered 2018-06-30: 30 mg via INTRAVENOUS

## 2018-06-30 MED ORDER — CALCIUM ACETATE (PHOS BINDER) 667 MG PO CAPS
667.0000 mg | ORAL_CAPSULE | Freq: Three times a day (TID) | ORAL | Status: DC
  Administered 2018-06-30 – 2018-07-04 (×7): 667 mg via ORAL
  Filled 2018-06-30 (×8): qty 1

## 2018-06-30 SURGICAL SUPPLY — 53 items
BANDAGE ACE 4X5 VEL STRL LF (GAUZE/BANDAGES/DRESSINGS) ×3 IMPLANT
BANDAGE ACE 6X5 VEL STRL LF (GAUZE/BANDAGES/DRESSINGS) ×3 IMPLANT
BANDAGE ESMARK 6X9 LF (GAUZE/BANDAGES/DRESSINGS) ×1 IMPLANT
BLADE SAW SAG 73X25 THK (BLADE) ×1
BLADE SAW SGTL 73X25 THK (BLADE) ×2 IMPLANT
BNDG CMPR 9X6 STRL LF SNTH (GAUZE/BANDAGES/DRESSINGS) ×1
BNDG COHESIVE 6X5 TAN STRL LF (GAUZE/BANDAGES/DRESSINGS) ×3 IMPLANT
BNDG ESMARK 6X9 LF (GAUZE/BANDAGES/DRESSINGS) ×3
BNDG GAUZE ELAST 4 BULKY (GAUZE/BANDAGES/DRESSINGS) ×4 IMPLANT
BRUSH SCRUB EZ PLAIN DRY (MISCELLANEOUS) ×2 IMPLANT
CANISTER SUCT 3000ML PPV (MISCELLANEOUS) ×3 IMPLANT
CLIP VESOCCLUDE MED 6/CT (CLIP) ×3 IMPLANT
COVER BACK TABLE 60X90IN (DRAPES) ×3 IMPLANT
COVER SURGICAL LIGHT HANDLE (MISCELLANEOUS) ×6 IMPLANT
COVER WAND RF STERILE (DRAPES) ×3 IMPLANT
DRAIN CHANNEL 19F RND (DRAIN) IMPLANT
DRAPE HALF SHEET 40X57 (DRAPES) ×3 IMPLANT
DRAPE ORTHO SPLIT 77X108 STRL (DRAPES) ×6
DRAPE SURG ORHT 6 SPLT 77X108 (DRAPES) ×2 IMPLANT
DRAPE U-SHAPE 47X51 STRL (DRAPES) IMPLANT
DRSG ADAPTIC 3X8 NADH LF (GAUZE/BANDAGES/DRESSINGS) ×3 IMPLANT
ELECT CAUTERY BLADE 6.4 (BLADE) ×3 IMPLANT
ELECT REM PT RETURN 9FT ADLT (ELECTROSURGICAL) ×3
ELECTRODE REM PT RTRN 9FT ADLT (ELECTROSURGICAL) ×1 IMPLANT
EVACUATOR SILICONE 100CC (DRAIN) IMPLANT
GAUZE SPONGE 4X4 12PLY STRL (GAUZE/BANDAGES/DRESSINGS) ×4 IMPLANT
GLOVE BIO SURGEON STRL SZ 6.5 (GLOVE) ×3 IMPLANT
GLOVE BIO SURGEON STRL SZ7.5 (GLOVE) ×9 IMPLANT
GLOVE BIO SURGEONS STRL SZ 6.5 (GLOVE) ×3
GLOVE BIOGEL PI IND STRL 7.0 (GLOVE) IMPLANT
GLOVE BIOGEL PI IND STRL 8 (GLOVE) ×1 IMPLANT
GLOVE BIOGEL PI INDICATOR 7.0 (GLOVE) ×2
GLOVE BIOGEL PI INDICATOR 8 (GLOVE) ×2
GLOVE SURG SS PI 7.5 STRL IVOR (GLOVE) ×2 IMPLANT
GOWN STRL REUS W/ TWL XL LVL3 (GOWN DISPOSABLE) ×1 IMPLANT
GOWN STRL REUS W/TWL XL LVL3 (GOWN DISPOSABLE) ×3
KIT BASIN OR (CUSTOM PROCEDURE TRAY) ×3 IMPLANT
KIT TURNOVER KIT B (KITS) ×3 IMPLANT
NS IRRIG 1000ML POUR BTL (IV SOLUTION) ×3 IMPLANT
PACK GENERAL/GYN (CUSTOM PROCEDURE TRAY) ×3 IMPLANT
PAD ARMBOARD 7.5X6 YLW CONV (MISCELLANEOUS) ×6 IMPLANT
STAPLER VISISTAT 35W (STAPLE) ×3 IMPLANT
STOCKINETTE IMPERVIOUS LG (DRAPES) ×3 IMPLANT
SUT ETHILON 3 0 PS 1 (SUTURE) IMPLANT
SUT SILK 0 TIES 10X30 (SUTURE) ×3 IMPLANT
SUT SILK 2 0 (SUTURE) ×3
SUT SILK 2 0 SH CR/8 (SUTURE) ×3 IMPLANT
SUT SILK 2-0 18XBRD TIE 12 (SUTURE) ×1 IMPLANT
SUT VIC AB 2-0 CT1 18 (SUTURE) ×8 IMPLANT
SUT VIC AB 3-0 SH 18 (SUTURE) IMPLANT
TOWEL GREEN STERILE (TOWEL DISPOSABLE) ×6 IMPLANT
UNDERPAD 30X30 (UNDERPADS AND DIAPERS) ×3 IMPLANT
WATER STERILE IRR 1000ML POUR (IV SOLUTION) ×3 IMPLANT

## 2018-06-30 NOTE — Transfer of Care (Signed)
Immediate Anesthesia Transfer of Care Note  Patient: Glenda [name]  Procedure(s) Performed: AMPUTATION ABOVE KNEE (Left Leg Upper)  Patient Location: PACU  Anesthesia Type:General  Level of Consciousness: awake, alert  and oriented  Airway & Oxygen Therapy: Patient Spontanous Breathing and Patient connected to nasal cannula oxygen  Post-op Assessment: Report given to RN and Post -op Vital signs reviewed and stable  Post vital signs: Reviewed and stable  Last Vitals:  Vitals Value Taken Time  BP 149/48 06/30/2018  1:06 PM  Temp    Pulse 69 06/30/2018  1:08 PM  Resp 12 06/30/2018  1:08 PM  SpO2 94 % 06/30/2018  1:08 PM  Vitals shown include unvalidated device data.  Last Pain:  Vitals:   06/30/18 1031  TempSrc:   PainSc: 0-No pain      Patients Stated Pain Goal: 3 (56/97/94 8016)  Complications: No apparent anesthesia complications

## 2018-06-30 NOTE — Anesthesia Postprocedure Evaluation (Signed)
Anesthesia Post Note  Patient: Glenda [name]  Procedure(s) Performed: AMPUTATION ABOVE KNEE (Left Leg Upper)     Patient location during evaluation: PACU Anesthesia Type: General Level of consciousness: awake and alert and oriented Pain management: pain level controlled Vital Signs Assessment: post-procedure vital signs reviewed and stable Respiratory status: spontaneous breathing, nonlabored ventilation and respiratory function stable Cardiovascular status: blood pressure returned to baseline and stable Postop Assessment: no apparent nausea or vomiting Anesthetic complications: no    Last Vitals:  Vitals:   06/30/18 1306 06/30/18 1321  BP: (!) 149/48 (!) 165/47  Pulse: 70 72  Resp: 13 12  Temp: (!) 36.4 C   SpO2: 94% 96%    Last Pain:  Vitals:   06/30/18 1330  TempSrc:   PainSc: 0-No pain                 Clare Fennimore A.

## 2018-06-30 NOTE — Op Note (Signed)
Date: June 30, 2018  Preoperative diagnosis: Nonhealing left below-knee amputation  Postoperative diagnosis: Same  Procedure: Left above-knee amputation  Surgeon: Dr. [name], MD  Assistant: Leontine Locket, PA  Indications: Patient is an 85 year old female who initially presented with critical limb ischemia of the bilateral lower extremities with tissue loss in the setting of multiple medical comorbidities.  She underwent bilateral lower extremity arteriogram and ultimately required a left below knee amputation that has been nonhealing.  She presents today for revision to an above-knee amputation after risks and benefits were discussed.  Findings: Left above-knee amputation performed with healthy tissue margins and viable muscle.  Details: Patient was taken to the operating room after informed consent was obtained.  She was placed on the operative table in supine position.  Her left leg was then prepped and draped in usual sterile fashion and her nonhealing BKA wound was prepped out of the field.  A prep timeout was performed to identify patient, procedure, and site.  Ultimately the femur was marked one handsbreadth above the patella on the left leg.  I then marked out a fishmouth incision creating anterior and posterior flap.  I then made a skin incision with a 15 blade scalpel.  I then used Bovie cautery and carried the dissection through the subcutaneous tissue, muscle fascia, and muscle itself and then circumferentially mobilized the femur.  The femur was then transected with an oscillating saw.  The distal SFA and femoral vein were then transected between Kelly clamps and ligated with a 2-0 Vicryl and 2-0 silk suture tie.  Ultimately the wound was then copiously irrigated until the effluent was clear.  The anterior posterior flaps were then closed with 2-0 Vicryl in interrupted fashion.  The skin was closed with staples.  She tolerated the procedure without any apparent  complications.  Condition: Stable  Complications: None  [name], MD [facility] Office: [phone] Pager: [place]

## 2018-06-30 NOTE — Anesthesia Procedure Notes (Signed)
Procedure Name: LMA Insertion Date/Time: 06/30/2018 11:53 AM Performed by: Mariea Clonts, CRNA Pre-anesthesia Checklist: Patient identified, Emergency Drugs available, Suction available and Patient being monitored Patient Re-evaluated:Patient Re-evaluated prior to induction Oxygen Delivery Method: Circle System Utilized Preoxygenation: Pre-oxygenation with 100% oxygen Induction Type: IV induction Ventilation: Mask ventilation without difficulty LMA: LMA inserted LMA Size: 4.0 Number of attempts: 1 Airway Equipment and Method: Bite block Placement Confirmation: positive ETCO2 Tube secured with: Tape Dental Injury: Teeth and Oropharynx as per pre-operative assessment

## 2018-06-30 NOTE — Plan of Care (Signed)
Care plan initiated and progressing

## 2018-06-30 NOTE — Consult Note (Signed)
Renal Service Consult Note [facility]  Glenda [name] 06/30/2018 [place] Requesting Physician:  Dr Carlis Abbott  Reason for Consult:  ESRD pt sp BKA revision to AKA HPI: The patient is a 85 y.o. year-old w/ hx of HTN, CVA, PAF, HTN, DM and ESRD on HD MWF.   Pt has hx of ischemia to both legs and recently underwent R toe amp and L BKA.  She is here now for non-healing L BKA wound and for revision to L AKA.  She had the surgery this am.  BP's postop are stable, HR and RR are good. We are asked to see for ESRD/ dialysis.   Patient is stable after surgery in her room.  Asked to dialyze tomorrow , too tired after surgery for dialysis. Denies any SOB, CP, cough or ab dpain.   ROS  denies CP  no joint pain   no HA  no blurry vision  no rash  no diarrhea  no nausea/ vomiting   Past Medical History  Past Medical History:  Diagnosis Date  . Arthritis    patient denies  . Breast cancer ([place])    Breast Cancer right   treated with radiation  . CAD (coronary artery disease)   . Chest pain   . CHF (congestive heart failure) ([place])   . Constipation   . Dizziness   . DM (diabetes mellitus) ([place])    TYpe II  . ESRD (end stage renal disease) ([place])    TTHSAT- [address]  . GERD (gastroesophageal reflux disease)   . HLD (hyperlipidemia)   . HTN (hypertension)   . Obesity   . PAF (paroxysmal atrial fibrillation) ([place])   . Radiation 09/11/15-10/13/15   42.72 Gy to right breast, 12 Gy boost to right breast  . Shortness of breath dyspnea    04/27/2018- not so much since she began dialysis  . Sleep apnea    no longer uses cpap   . Stroke [facility])    balance issues - walks with walker and uses wheelchair  Had 3 in 2017- last 1 was in October. 3/5/2020uses wheel chair   . UTI (urinary tract infection)    Past Surgical History  Past Surgical History:  Procedure Laterality Date  . ABDOMINAL AORTOGRAM W/LOWER EXTREMITY N/A 04/27/2018   Procedure: ABDOMINAL AORTOGRAM W/LOWER  EXTREMITY;  Surgeon: [name], MD;  Location: [place] [facility];  Service: Cardiovascular;  Laterality: N/A;  . AMPUTATION Left 04/28/2018   Procedure: AMPUTATION RAY LEFT;  Surgeon: [name], MD;  Location: [place];  Service: Vascular;  Laterality: Left;  . AMPUTATION Left 05/26/2018   Procedure: AMPUTATION BELOW KNEE, LEFT;  Surgeon: [name], MD;  Location: [place];  Service: Vascular;  Laterality: Left;  . AV FISTULA PLACEMENT Left 07/14/2017   Procedure: Left arm Brachicephalic Fistula Creation;  Surgeon: Serafina Mitchell, MD;  Location: [place];  Service: Vascular;  Laterality: Left;  . BALLOON DILATION N/A 08/18/2017   Procedure: BALLOON DILATION;  Surgeon: Clarene Essex, MD;  Location: [place];  Service: Endoscopy;  Laterality: N/A;  . BASCILIC VEIN TRANSPOSITION Left 10/12/2017   Procedure: SECOND STAGE BASILIC VEIN TRANSPOSITION LEFT ARM;  Surgeon: Serafina Mitchell, MD;  Location: [place];  Service: Vascular;  Laterality: Left;  . CHOLECYSTECTOMY    . colon polyps; hx    . CORONARY ANGIOPLASTY  02/14/09  . ESOPHAGOGASTRODUODENOSCOPY (EGD) WITH PROPOFOL Left 09/08/2015   Procedure: ESOPHAGOGASTRODUODENOSCOPY (EGD) WITH PROPOFOL;  Surgeon: Clarene Essex,  MD;  Location: [place] [facility];  Service: Endoscopy;  Laterality: Left;  . ESOPHAGOGASTRODUODENOSCOPY (EGD) WITH PROPOFOL N/A 08/18/2017   Procedure: ESOPHAGOGASTRODUODENOSCOPY (EGD) WITH PROPOFOL;  Surgeon: Clarene Essex, MD;  Location: [place];  Service: Endoscopy;  Laterality: N/A;  with flouroscopy  . IR FLUORO GUIDE CV LINE RIGHT  08/25/2017  . IR FLUORO GUIDE CV LINE RIGHT  08/29/2017  . IR US GUIDE VASC ACCESS RIGHT  08/25/2017  . LOWER EXTREMITY ANGIOGRAPHY Right 05/24/2018   Procedure: Lower Extremity Angiography;  Surgeon: [name], MD;  Location: [place] [facility];  Service: Cardiovascular;  Laterality: Right;  . PERIPHERAL VASCULAR ATHERECTOMY Right 05/24/2018   Procedure: PERIPHERAL VASCULAR  ATHERECTOMY;  Surgeon: [name], MD;  Location: [place] [facility];  Service: Cardiovascular;  Laterality: Right;  Anterial Tibial  . RADIOACTIVE SEED GUIDED PARTIAL MASTECTOMY WITH AXILLARY SENTINEL LYMPH NODE BIOPSY Right 07/17/2015   Procedure: RADIOACTIVE SEED GUIDED PARTIAL MASTECTOMY WITH AXILLARY SENTINEL LYMPH NODE BIOPSY;  Surgeon: Erroll Luna, MD;  Location: [place];  Service: General;  Laterality: Right;   Family History  Family History  Problem Relation Age of Onset  . Diabetes Mother   . Colon cancer Father        also had lung  . Diabetes Other   . Heart attack Other   . Diabetes Sister        [name]  . Breast cancer Sister    Social History  reports that she has never smoked. She has never used smokeless tobacco. She reports that she does not drink alcohol or use drugs. Allergies No Known Allergies Home medications Prior to Admission medications   Medication Sig Start Date End Date Taking? Authorizing Provider  acetaminophen (TYLENOL) 650 MG CR tablet Take 1,300 mg by mouth every 8 (eight) hours as needed for pain.   Yes [provider]  aspirin 81 MG EC tablet Take 1 tablet (81 mg total) by mouth every evening. 05/30/18  Yes Dagoberto Ligas, PA-C  b complex-vitamin c-folic acid (NEPHRO-VITE) 0.8 MG TABS tablet Take 1 tablet by mouth every evening.   Yes [provider]  calcium acetate (PHOSLO) 667 MG capsule Take 667 mg by mouth 3 (three) times daily with meals.   Yes [provider]  citalopram (CELEXA) 20 MG tablet Take 20 mg by mouth every evening.    Yes [provider]  clopidogrel (PLAVIX) 75 MG tablet Take 1 tablet (75 mg total) by mouth daily. 05/30/18  Yes Dagoberto Ligas, PA-C  donepezil (ARICEPT) 5 MG tablet Take 5 mg by mouth at bedtime.   Yes [provider]  famotidine (PEPCID AC) 10 MG chewable tablet Chew 10 mg by mouth daily as needed for heartburn.   Yes [provider]  HUMALOG MIX 50/50  KWIKPEN (50-50) 100 UNIT/ML Kwikpen Inject 14-20 Units into the skin See admin instructions. Inject 20 units in the morning and 14 units at evening; hold for blood sugar readings lower than 200 01/06/17  Yes [provider]  isosorbide mononitrate (IMDUR) 30 MG 24 hr tablet Take 1 tablet (30 mg total) daily by mouth. Patient taking differently: Take 30 mg by mouth every evening.  03/06/17 10/08/18 Yes Thurnell Lose, MD  metoprolol succinate (TOPROL-XL) 25 MG 24 hr tablet Take 1 tablet (25 mg total) by mouth daily. Patient taking differently: Take 12.5 mg by mouth every evening.  09/01/17  Yes Ghimire, Henreitta Leber, MD  oxyCODONE-acetaminophen (PERCOCET/ROXICET) 5-325 MG tablet Take 1-2 tablets by  mouth every 6 (six) hours as needed for severe pain. 05/30/18  Yes Dagoberto Ligas, PA-C  pantoprazole (PROTONIX) 20 MG tablet Take 20 mg by mouth every evening.    Yes [provider]  pravastatin (PRAVACHOL) 80 MG tablet Take 80 mg by mouth at bedtime.    Yes [provider]  cloNIDine (CATAPRES) 0.1 MG tablet Take 0.1 mg by mouth See admin instructions. Take 0.1 mg by mouth in the evening and 0.1 mg by mouth at bedtime    [provider]   Liver Function Tests No results for input(s): AST, ALT, ALKPHOS, BILITOT, PROT, ALBUMIN in the last 168 hours. No results for input(s): LIPASE, AMYLASE in the last 168 hours. CBC Recent Labs  Lab 06/30/18 1041  HGB 10.5*  HCT 31.5*   Basic Metabolic Panel Recent Labs  Lab 06/30/18 1041  NA 138  K 4.1  GLUCOSE 132*   Iron/TIBC/Ferritin/ %Sat    Component Value Date/Time   IRON 32 08/24/2017 0525   TIBC 167 (L) 08/24/2017 0525   FERRITIN 496 (H) 08/24/2017 0525   IRONPCTSAT 19 08/24/2017 0525    Vitals:   06/30/18 1425 06/30/18 1426 06/30/18 1444 06/30/18 1445  BP:   (!) 165/52 (!) 160/65  Pulse: 73  72 74  Resp: 16  13 16   Temp:  97.7 F (36.5 C)    TempSrc:      SpO2: 99%  98% 99%  Weight:      Height:        Exam Gen elderly AAF, no distress, calm No rash, cyanosis or gangrene Sclera anicteric, throat clear   No jvd or bruits Chest clear bilat to bases RRR no MRG Abd soft ntnd no mass or ascites +bs GU defer MS no joint effusions or deformity Ext L AKA stump wrapped in dressing, bilat LE edema 1-2+ edema Neuro is alert, Ox 3 , nf    Home meds:  - aspirin 81/ isosorbide mono 30 qd/ pravastatin 80/ clopidogrel 75 qd  - metoprolol xl 25 qd/ clonidine 0.1 bid  - citalopram 20 mg hs/ donepezil 5 qd/ oxycodone - aceta prn  - pantoprazole 20 hs/ famotidine 10 prn/ calc acetate ac tid  - insulin 50/50 mix 20u am and 14u pm    MWF [facility]  4h 86min   59.5kg  2/2 bath  AVF  Hep 6000  - mircera 200 , last 2/26  - calc 0.5 ug tiw   Assessment: 1. Non-healing L BKA sp L AKA 2. ESRD on HD  3. HTN 4. Dementia, mild  5. DM on insulin  6. Anemia CKD - Hb 10 7. H/o CVA 8. H/o breast Ca  Plan: 1. HD Sat off schedule. Will follow.       [facility] 06/30/2018, 3:05 PM

## 2018-06-30 NOTE — H&P (Signed)
History and Physical Interval Note:  06/30/2018 11:25 AM  Glenda [name]  has presented today for surgery, with the diagnosis of NECROTIC LEFT BELOW KNEE AMPUTATION  The various methods of treatment have been discussed with the patient and family. After consideration of risks, benefits and other options for treatment, the patient has consented to  Procedure(s): AMPUTATION ABOVE KNEE (Left) as a surgical intervention .  The patient's history has been reviewed, patient examined, no change in status, stable for surgery.  I have reviewed the patient's chart and labs.  Questions were answered to the patient's satisfaction.    Left above knee ampuation  Glenda [name]  Patient name: Glenda [name]         MRN: [id number]        DOB: July 28, 1933        Sex: female  REASON FOR VISIT: follow-up after L BKA and right leg revascularization  HPI: Glenda [name] is a 85 y.o. female with multiple medical problems that presents for interval follow-up and ongoing wound check.  She underwent a right lower extremity revascularization with an AT atherectomy and angioplasty for critical limb ischemia and tissue loss of the right great toe on 05/24/2018. She also underwent a left BKA on 05/26/2018 for critical limb ischemia with tissue loss.   Concerned last week that left BKA is not healing.  Also had developed right heel ulcer.        Past Medical History:  Diagnosis Date  . Arthritis    patient denies  . Breast cancer ([place])    Breast Cancer right   treated with radiation  . CAD (coronary artery disease)   . Chest pain   . CHF (congestive heart failure) ([place])   . Dizziness   . DM (diabetes mellitus) ([place])    TYpe II  . ESRD (end stage renal disease) ([place])    TTHSAT- [address]  . GERD (gastroesophageal reflux disease)   . HLD (hyperlipidemia)   . HTN (hypertension)   . Obesity   . PAF (paroxysmal atrial fibrillation) ([place])   . Radiation 09/11/15-10/13/15   42.72 Gy to right  breast, 12 Gy boost to right breast  . Shortness of breath dyspnea    04/27/2018- not so much since she began dialysis  . Sleep apnea    no longer uses cpap   . Stroke [facility])    balance issues - walks with walker and uses wheelchair  Had 3 in 2017- last 1 was in October  . UTI (urinary tract infection)          Past Surgical History:  Procedure Laterality Date  . ABDOMINAL AORTOGRAM W/LOWER EXTREMITY N/A 04/27/2018   Procedure: ABDOMINAL AORTOGRAM W/LOWER EXTREMITY;  Surgeon: Glenda Heck, MD;  Location: [place] [facility];  Service: Cardiovascular;  Laterality: N/A;  . AMPUTATION Left 04/28/2018   Procedure: AMPUTATION RAY LEFT;  Surgeon: Glenda Heck, MD;  Location: [place];  Service: Vascular;  Laterality: Left;  . AMPUTATION Left 05/26/2018   Procedure: AMPUTATION BELOW KNEE, LEFT;  Surgeon: Glenda Heck, MD;  Location: [place];  Service: Vascular;  Laterality: Left;  . AV FISTULA PLACEMENT Left 07/14/2017   Procedure: Left arm Brachicephalic Fistula Creation;  Surgeon: Serafina Mitchell, MD;  Location: [place];  Service: Vascular;  Laterality: Left;  . BALLOON DILATION N/A 08/18/2017   Procedure: BALLOON DILATION;  Surgeon: Clarene Essex, MD;  Location: [place];  Service: Endoscopy;  Laterality: N/A;  . BASCILIC VEIN TRANSPOSITION Left  10/12/2017   Procedure: SECOND STAGE BASILIC VEIN TRANSPOSITION LEFT ARM;  Surgeon: Serafina Mitchell, MD;  Location: [place];  Service: Vascular;  Laterality: Left;  . CHOLECYSTECTOMY    . colon polyps; hx    . CORONARY ANGIOPLASTY  02/14/09  . ESOPHAGOGASTRODUODENOSCOPY (EGD) WITH PROPOFOL Left 09/08/2015   Procedure: ESOPHAGOGASTRODUODENOSCOPY (EGD) WITH PROPOFOL;  Surgeon: Clarene Essex, MD;  [facility];  Service: Endoscopy;  Laterality: Left;  . ESOPHAGOGASTRODUODENOSCOPY (EGD) WITH PROPOFOL N/A 08/18/2017   Procedure: ESOPHAGOGASTRODUODENOSCOPY (EGD) WITH PROPOFOL;  Surgeon: Clarene Essex, MD;  Location: [place];  Service: Endoscopy;  Laterality: N/A;  with flouroscopy  . IR FLUORO GUIDE CV LINE RIGHT  08/25/2017  . IR FLUORO GUIDE CV LINE RIGHT  08/29/2017  . IR US GUIDE VASC ACCESS RIGHT  08/25/2017  . LOWER EXTREMITY ANGIOGRAPHY Right 05/24/2018   Procedure: Lower Extremity Angiography;  Surgeon: Glenda Heck, MD;  Location: [place] [facility];  Service: Cardiovascular;  Laterality: Right;  . PERIPHERAL VASCULAR ATHERECTOMY Right 05/24/2018   Procedure: PERIPHERAL VASCULAR ATHERECTOMY;  Surgeon: Glenda Heck, MD;  Location: [place] [facility];  Service: Cardiovascular;  Laterality: Right;  Anterial Tibial  . RADIOACTIVE SEED GUIDED PARTIAL MASTECTOMY WITH AXILLARY SENTINEL LYMPH NODE BIOPSY Right 07/17/2015   Procedure: RADIOACTIVE SEED GUIDED PARTIAL MASTECTOMY WITH AXILLARY SENTINEL LYMPH NODE BIOPSY;  Surgeon: Erroll Luna, MD;  Location: [place];  Service: General;  Laterality: Right;         Family History  Problem Relation Age of Onset  . Diabetes Mother   . Colon cancer Father        also had lung  . Diabetes Other   . Heart attack Other   . Diabetes Sister        Glenda [name]  . Breast cancer Sister     SOCIAL HISTORY: Social History        Tobacco Use  . Smoking status: Never Smoker  . Smokeless tobacco: Never Used  Substance Use Topics  . Alcohol use: No    Alcohol/week: 0.0 standard drinks    No Known Allergies        Current Outpatient Medications  Medication Sig Dispense Refill  . acetaminophen (TYLENOL) 650 MG CR tablet Take 1,300 mg by mouth every 8 (eight) hours as needed for pain.    Marland Kitchen aspirin 81 MG EC tablet Take 1 tablet (81 mg total) by mouth every evening. 30 tablet   . b complex-vitamin c-folic acid (NEPHRO-VITE) 0.8 MG TABS tablet Take 1 tablet by mouth every evening.    . calcium acetate (PHOSLO) 667 MG capsule Take 667 mg by mouth 3 (three) times daily with meals.    . citalopram (CELEXA) 20 MG tablet Take 20 mg  by mouth every evening.     . cloNIDine (CATAPRES) 0.1 MG tablet Take 0.1 mg by mouth See admin instructions. Take 0.1 mg by mouth in the evening and 0.1 mg by mouth at bedtime    . clopidogrel (PLAVIX) 75 MG tablet Take 1 tablet (75 mg total) by mouth daily. 90 tablet 1  . donepezil (ARICEPT) 5 MG tablet Take 5 mg by mouth at bedtime.    . famotidine (PEPCID AC) 10 MG chewable tablet Chew 10 mg by mouth daily as needed for heartburn.    Marland Kitchen HUMALOG MIX 50/50 KWIKPEN (50-50) 100 UNIT/ML Kwikpen Inject 14-20 Units into the skin See admin instructions. Inject 20 units in the morning and 14 units at evening; hold for  blood sugar readings lower than 200  11  . isosorbide mononitrate (IMDUR) 30 MG 24 hr tablet Take 1 tablet (30 mg total) daily by mouth. (Patient taking differently: Take 30 mg by mouth every evening. ) 30 tablet 0  . metoprolol succinate (TOPROL-XL) 25 MG 24 hr tablet Take 1 tablet (25 mg total) by mouth daily. (Patient taking differently: Take 12.5 mg by mouth every evening. ) 30 tablet 0  . oxyCODONE-acetaminophen (PERCOCET/ROXICET) 5-325 MG tablet Take 1-2 tablets by mouth every 6 (six) hours as needed for severe pain. 15 tablet 0  . pantoprazole (PROTONIX) 20 MG tablet Take 20 mg by mouth every evening.     . pravastatin (PRAVACHOL) 80 MG tablet Take 80 mg by mouth at bedtime.      No current facility-administered medications for this visit.     REVIEW OF SYSTEMS:  [X]  denotes positive finding, [ ]  denotes negative finding Cardiac  Comments:  Chest pain or chest pressure:    Shortness of breath upon exertion:    Short of breath when lying flat:    Irregular heart rhythm:        Vascular    Pain in calf, thigh, or hip brought on by ambulation:    Pain in feet at night that wakes you up from your sleep:     Blood clot in your veins:    Leg swelling:         Pulmonary    Oxygen at home:    Productive cough:     Wheezing:          Neurologic    Sudden weakness in arms or legs:     Sudden numbness in arms or legs:     Sudden onset of difficulty speaking or slurred speech:    Temporary loss of vision in one eye:     Problems with dizziness:         Gastrointestinal    Blood in stool:     Vomited blood:         Genitourinary    Burning when urinating:     Blood in urine:        Psychiatric    Major depression:         Hematologic    Bleeding problems:    Problems with blood clotting too easily:        Skin    Rashes or ulcers:        Constitutional    Fever or chills:      PHYSICAL EXAM:    Vitals:   06/27/18 1612  BP: (!) 155/54  Pulse: 85  Resp: 18  SpO2: 96%  Weight: 130 lb (59 kg)  Height: 5\' 6"  (1.676 m)    GENERAL: The patient is a well-nourished female, in no acute distress. The vital signs are documented above. CARDIAC: There is a regular rate and rhythm.  VASCULAR:  Left BKA with ulcerated segment on medial side  Right great toe dry gangrene, palpable right DP, right heel ulcer Palpable femoral pulses bilaterally PULMONARY: There is good air exchange bilaterally without wheezing or rales. ABDOMEN: Soft and non-tender with normal pitched bowel sounds.  MUSCULOSKELETAL: There are no major deformities or cyanosis. NEUROLOGIC: No focal weakness or paresthesias are detected.  DATA:   I independently reviewed her right leg arterial duplex and right AT patent with biphasic signal and no stenosis after atherectomy  Assessment/Plan:  85 year old female with bilateral lower extremity critical limb ischemia with tissue  loss.  She has most recently undergone a left BKA on 05/26/2018 as well as a right AT atherectomy and angioplasty on 05/24/2018 for critical ischemia with tissue loss of the right great toe.    Remaining staples from left BKA removed today.  Ongoing discussion with family about non-healing BKA  with necrotic segment on medial side of incision.  Offered AKA today.  Ms. Orsak does not want anymore surgery today.  She wants to continue close interval follow-up and discussed my concern for continued wound breakdown, infection, etc..   Continue Betadine paint to the right great toe that has undergone revascularization and she has a palpable dorsalis pedis.  Continue heel protector to right foot at night given new heel wound (pressure ulcer).    Glenda Heck, MD [facility] Office: [phone] Pager: [phone]

## 2018-06-30 NOTE — H&P (Addendum)
Date: 06/30/2018               Patient Name:  Glenda [name] MRN: [id number]  DOB: 03/19/1934 Age / Sex: 85 y.o., female   PCP: Glenda Pou, MD         Medical Service: Internal Medicine Teaching Service         Attending Physician: Glenda [name], Glenda [name], *    First Contact: Glenda [name], [place] Pager: [phone]  Second Contact: Glenda [name] Pager: [phone]       After Hours (After 5p/  First Contact Pager: [phone]  weekends / holidays): Second Contact Pager: [phone]   Chief Complaint: Left above-the-knee-amputation  History of Present Illness: Glenda [name] is being admitted status post left AKA at the request of vascular surgery. She has a history of PVD and was seen by vascular on 04/22/19 for right lower extremity critical limb ischemia and tissue loss with great toe dry gangrene. She underwent anterior tibial artery atherectomy and balloon angioplasty on 05/24/2018 by Glenda [name]. She also had a non healing left toe amputation without further revascularization options, and required BKA on 05/26/18. She was discharged on dual antiplatelet therapy with aspirin and plavix to a SNF on 05/30/18. She was seen by vascular for follow up on 06/20/18 and noted to have an area of necrosis of her left BKA concerning for non-healing. She was admitted today and underwent successful left AKA.   Glenda [name] was seen in the PACU post operatively. She is doing well today after her surgery. She is tired. She denies pain, n/v, confusion, headache, chest pain, or shortness of breath. Her last bowel movement was this morning before the surgery. She is currently asymptomatic.   Meds: Current Meds  Medication Sig  . acetaminophen (TYLENOL) 650 MG CR tablet Take 1,300 mg by mouth every 8 (eight) hours as needed for pain.  Marland Kitchen aspirin 81 MG EC tablet Take 1 tablet (81 mg total) by mouth every evening.  Marland Kitchen b complex-vitamin c-folic acid (NEPHRO-VITE) 0.8 MG TABS tablet Take 1 tablet by mouth every  evening.  . calcium acetate (PHOSLO) 667 MG capsule Take 667 mg by mouth 3 (three) times daily with meals.  . citalopram (CELEXA) 20 MG tablet Take 20 mg by mouth every evening.   . clopidogrel (PLAVIX) 75 MG tablet Take 1 tablet (75 mg total) by mouth daily.  Marland Kitchen donepezil (ARICEPT) 5 MG tablet Take 5 mg by mouth at bedtime.  . famotidine (PEPCID AC) 10 MG chewable tablet Chew 10 mg by mouth daily as needed for heartburn.  Marland Kitchen HUMALOG MIX 50/50 KWIKPEN (50-50) 100 UNIT/ML Kwikpen Inject 14-20 Units into the skin See admin instructions. Inject 20 units in the morning and 14 units at evening; hold for blood sugar readings lower than 200  . isosorbide mononitrate (IMDUR) 30 MG 24 hr tablet Take 1 tablet (30 mg total) daily by mouth. (Patient taking differently: Take 30 mg by mouth every evening. )  . metoprolol succinate (TOPROL-XL) 25 MG 24 hr tablet Take 1 tablet (25 mg total) by mouth daily. (Patient taking differently: Take 12.5 mg by mouth every evening. )  . oxyCODONE-acetaminophen (PERCOCET/ROXICET) 5-325 MG tablet Take 1-2 tablets by mouth every 6 (six) hours as needed for severe pain.  . pantoprazole (PROTONIX) 20 MG tablet Take 20 mg by mouth every evening.   . pravastatin (PRAVACHOL) 80 MG tablet Take 80 mg by mouth at bedtime.      Allergies: Allergies as  of 06/29/2018  . (No Known Allergies)   Past Medical History:  Diagnosis Date  . Arthritis    patient denies  . Breast cancer ([place])    Breast Cancer right   treated with radiation  . CAD (coronary artery disease)   . Chest pain   . CHF (congestive heart failure) ([place])   . Constipation   . Dizziness   . DM (diabetes mellitus) ([place])    TYpe II  . ESRD (end stage renal disease) ([place])    TTHSAT- [address]  . GERD (gastroesophageal reflux disease)   . HLD (hyperlipidemia)   . HTN (hypertension)   . Obesity   . PAF (paroxysmal atrial fibrillation) ([place])   . Radiation 09/11/15-10/13/15   42.72 Gy to right breast, 12 Gy boost to  right breast  . Shortness of breath dyspnea    04/27/2018- not so much since she began dialysis  . Sleep apnea    no longer uses cpap   . Stroke [facility])    balance issues - walks with walker and uses wheelchair  Had 3 in 2017- last 1 was in October. 3/5/2020uses wheel chair   . UTI (urinary tract infection)     Family History: Mother and twin sister have diabetes. Twin sister has breast cancer. Father had lung and colon cancer.  Social History: Glenda [name] lives in [place] with her son. He moved in 1 year ago to assist her with ADLs. She has needed help around the house since having a stroke 3 years ago. She does not smoke and does not drink. No pets.  Review of Systems: A complete ROS was negative except as per HPI.   Physical Exam: Blood pressure (!) 171/52, pulse 71, temperature 97.7 F (36.5 C), resp. rate 15, height 5\' 6"  (1.676 m), weight 58.9 kg, SpO2 100 %.  General: Friendly, well-appearing older woman in no acute distress. Cardiovascular: Regular rate and rhythm, 3/6 holosystolic murmur heard loudest at the heart base. No JVD. Edema is 2+ in right lower leg. Right lower leg is warm. Dorsalis pedis and posterior tibial pulses are 2+.  Pulmonary: Lungs are clear to auscultation bilaterally. Good respiratory effort Abdominal: No abdominal tenderness. Bowel sounds are present. No organomegaly is identified Integumentary: warm and moist. Ischemic changes on the distal tip of the right great toe. Musculoskeletal: Left leg AKA. Right arm and leg weakness Neurologic: Alert and oriented x2. Some cognitive impairment and memory difficulties. No focal neurologic deficits. Sensation intact throughout her right lower leg.  CBC Latest Ref Rng & Units 06/30/2018 05/30/2018 05/29/2018  WBC 4.0 - 10.5 K/uL - 10.8(H) 9.5  Hemoglobin 12.0 - 15.0 g/dL 10.5(L) 7.3(L) 8.5(L)  Hematocrit 36.0 - 46.0 % 31.0(L) 24.3(L) 28.6(L)  Platelets 150 - 400 K/uL - 169 152   CMP Latest Ref Rng & Units 06/30/2018  05/30/2018 05/29/2018  Glucose 70 - 99 mg/dL 132(H) 169(H) 133(H)  BUN 8 - 23 mg/dL - 35(H) 23  Creatinine 0.44 - 1.00 mg/dL - 5.83(H) 4.07(H)  Sodium 135 - 145 mmol/L 138 133(L) 139  Potassium 3.5 - 5.1 mmol/L 4.1 3.9 3.8  Chloride 98 - 111 mmol/L - 92(L) 99  CO2 22 - 32 mmol/L - 26 25  Calcium 8.9 - 10.3 mg/dL - 8.2(L) 8.4(L)  Total Protein 6.5 - 8.1 g/dL - - -  Total Bilirubin 0.3 - 1.2 mg/dL - - -  Alkaline Phos 38 - 126 U/L - - -  AST 15 - 41 U/L - - -  ALT 0 - 44 U/L - - -   Assessment & Plan by Problem: Active Problems:   Diabetes mellitus type 1, uncontrolled, insulin dependent (HCC)   GASTROESOPHAGEAL REFLUX DISEASE   Acute on chronic diastolic CHF (congestive heart failure) (HCC)   Essential hypertension   BKA stump complication (HCC)   S/p Left AKA: Mrs. Makar was admitted on 3/6 for AKA 2/2 poor wound healing of her BKA performed on 1/31. Patient has a history of severe PVD with prior re vascularization of her right lower extremity and great toe amputaion. She also had a non healing left toe ulceration without options for revascularization. She underwent left BKA complicated by non-healing necrosis, now s/p left AKA. Her procedure was completed without complication.  -- Appreciate VVS consultation  -- Continue ASA & Plavix  -- Pravastatin  -- PT / OT  -- Pain management w/ prn home percocet and prn IV dilaudid 0.5 mg for breakthrough pain  ESRD on HD T/Th/Sat --nephrology has been consulted and she is scheduled for HD on 3/7 -- Continue home phoslo    Chronic Diastolic Heart Failure Hx Paroxsymal Atrial Fibrillation  HTN Hypertensive to 612A systolic post op. Daughter reports worsening right LE edema, 2+ on exam. No JVD, lungs are clear.  -- Volume management per nephro --Telemetry  --Continue home isosorbide dinitrate 30mg  QHS --Continue home metoprolol 12.5 mg qhs --Continue home clonidine 0.1 mg qhs   Diabetes: Takes Humalog 50-50 20 units qAM and 45 units qPM.   --10 Lantus qhs, sliding scale tid w/ meals -- CBG checks  GERD: --continue home pantoprazole 20mg  qhs  FEN: No fluids, replete lytes prn, HH / Carb mod VTE ppx: SCDs Code Status: FULL     Dispo: Admit patient to Inpatient with expected length of stay greater than 2 midnights.  Signed: Noralyn Pick, Medical Student 06/30/2018, 2:35 PM  Pager: [phone]   Attestation for Student Documentation:  I personally was present and performed or re-performed the history, physical exam and medical decision-making activities of this service and have verified that the service and findings are accurately documented in the student's note.  Velna Ochs, MD 06/30/2018, 4:31 PM

## 2018-06-30 NOTE — Anesthesia Preprocedure Evaluation (Addendum)
Anesthesia Evaluation  Patient identified by MRN, date of birth, ID band Patient awake    Reviewed: Allergy & Precautions, NPO status , Patient's Chart, lab work & pertinent test results  Airway Mallampati: II  TM Distance: >3 FB Neck ROM: Full    Dental  (+) Missing   Pulmonary shortness of breath and with exertion, sleep apnea and Continuous Positive Airway Pressure Ventilation ,    Pulmonary exam normal breath sounds clear to auscultation       Cardiovascular hypertension, Pt. on medications and Pt. on home beta blockers + CAD, + Peripheral Vascular Disease and +CHF  Normal cardiovascular exam+ dysrhythmias Atrial Fibrillation  Rhythm:Regular Rate:Normal     Neuro/Psych Depression Dysphagia and hemiparesis S/P CVA CVA, Residual Symptoms    GI/Hepatic Neg liver ROS, GERD  Medicated and Controlled,  Endo/Other  diabetes, Poorly Controlled, Type 2, Insulin Dependent  Renal/GU ESRFRenal disease  negative genitourinary   Musculoskeletal  (+) Arthritis , Osteoarthritis,  Infected BKA   Abdominal   Peds  Hematology  (+) anemia , Plavix   Anesthesia Other Findings   Reproductive/Obstetrics                            Anesthesia Physical Anesthesia Plan  ASA: III  Anesthesia Plan: General   Post-op Pain Management:    Induction: Intravenous  PONV Risk Score and Plan: 4 or greater and Ondansetron and Treatment may vary due to age or medical condition  Airway Management Planned: LMA  Additional Equipment:   Intra-op Plan:   Post-operative Plan: Extubation in OR  Informed Consent: I have reviewed the patients History and Physical, chart, labs and discussed the procedure including the risks, benefits and alternatives for the proposed anesthesia with the patient or authorized representative who has indicated his/her understanding and acceptance.     Dental advisory given  Plan  Discussed with: CRNA and Surgeon  Anesthesia Plan Comments:         Anesthesia Quick Evaluation

## 2018-07-01 ENCOUNTER — Encounter (HOSPITAL_COMMUNITY): Payer: Self-pay | Admitting: Vascular Surgery

## 2018-07-01 DIAGNOSIS — I5032 Chronic diastolic (congestive) heart failure: Secondary | ICD-10-CM

## 2018-07-01 DIAGNOSIS — N186 End stage renal disease: Secondary | ICD-10-CM

## 2018-07-01 DIAGNOSIS — Z8679 Personal history of other diseases of the circulatory system: Secondary | ICD-10-CM

## 2018-07-01 DIAGNOSIS — Z992 Dependence on renal dialysis: Secondary | ICD-10-CM

## 2018-07-01 DIAGNOSIS — I132 Hypertensive heart and chronic kidney disease with heart failure and with stage 5 chronic kidney disease, or end stage renal disease: Secondary | ICD-10-CM

## 2018-07-01 DIAGNOSIS — Z89612 Acquired absence of left leg above knee: Secondary | ICD-10-CM

## 2018-07-01 DIAGNOSIS — Z79899 Other long term (current) drug therapy: Secondary | ICD-10-CM

## 2018-07-01 DIAGNOSIS — K219 Gastro-esophageal reflux disease without esophagitis: Secondary | ICD-10-CM

## 2018-07-01 DIAGNOSIS — L8990 Pressure ulcer of unspecified site, unspecified stage: Secondary | ICD-10-CM

## 2018-07-01 DIAGNOSIS — E1022 Type 1 diabetes mellitus with diabetic chronic kidney disease: Secondary | ICD-10-CM

## 2018-07-01 LAB — BASIC METABOLIC PANEL
Anion gap: 16 — ABNORMAL HIGH (ref 5–15)
BUN: 39 mg/dL — ABNORMAL HIGH (ref 8–23)
CO2: 23 mmol/L (ref 22–32)
Calcium: 8.6 mg/dL — ABNORMAL LOW (ref 8.9–10.3)
Chloride: 100 mmol/L (ref 98–111)
Creatinine, Ser: 5.07 mg/dL — ABNORMAL HIGH (ref 0.44–1.00)
GFR calc Af Amer: 9 mL/min — ABNORMAL LOW (ref >60)
GFR calc non Af Amer: 7 mL/min — ABNORMAL LOW (ref >60)
Glucose, Bld: 121 mg/dL — ABNORMAL HIGH (ref 70–99)
Potassium: 4.6 mmol/L (ref 3.5–5.1)
SODIUM: 139 mmol/L (ref 135–145)

## 2018-07-01 LAB — GLUCOSE, CAPILLARY
Glucose-Capillary: 135 mg/dL — ABNORMAL HIGH (ref 70–99)
Glucose-Capillary: 113 mg/dL — ABNORMAL HIGH (ref 70–99)
Glucose-Capillary: 202 mg/dL — ABNORMAL HIGH (ref 70–99)
Glucose-Capillary: 124 mg/dL — ABNORMAL HIGH (ref 70–99)
Glucose-Capillary: 288 mg/dL — ABNORMAL HIGH (ref 70–99)

## 2018-07-01 LAB — CBC
HCT: 25.1 % — ABNORMAL LOW (ref 36.0–46.0)
Hemoglobin: 7.6 g/dL — ABNORMAL LOW (ref 12.0–15.0)
MCH: 29.1 pg (ref 26.0–34.0)
MCHC: 30.3 g/dL (ref 30.0–36.0)
MCV: 96.2 fL (ref 80.0–100.0)
Platelets: 215 10*3/uL (ref 150–400)
RBC: 2.61 MIL/uL — ABNORMAL LOW (ref 3.87–5.11)
RDW: 18.9 % — ABNORMAL HIGH (ref 11.5–15.5)
WBC: 13.8 10*3/uL — ABNORMAL HIGH (ref 4.0–10.5)
nRBC: 0 % (ref 0.0–0.2)

## 2018-07-01 LAB — HEMOGLOBIN AND HEMATOCRIT, BLOOD
HCT: 25.2 % — ABNORMAL LOW (ref 36.0–46.0)
HEMOGLOBIN: 7.6 g/dL — AB (ref 12.0–15.0)

## 2018-07-01 MED ORDER — LIDOCAINE-PRILOCAINE 2.5-2.5 % EX CREA: 1.0000 "application " | TOPICAL_CREAM | CUTANEOUS | Status: DC | PRN

## 2018-07-01 MED ORDER — LIDOCAINE HCL (PF) 1 % IJ SOLN: 5.0000 mL | INTRAMUSCULAR | Status: DC | PRN

## 2018-07-01 MED ORDER — HYDROMORPHONE HCL 1 MG/ML IJ SOLN
INTRAMUSCULAR | Status: AC
  Filled 2018-07-01: qty 0.5

## 2018-07-01 MED ORDER — PENTAFLUOROPROP-TETRAFLUOROETH EX AERO: 1.0000 "application " | INHALATION_SPRAY | CUTANEOUS | Status: DC | PRN

## 2018-07-01 MED ORDER — SODIUM CHLORIDE 0.9 % IV SOLN: 100.0000 mL | INTRAVENOUS | Status: DC | PRN

## 2018-07-01 MED ORDER — HEPARIN SODIUM (PORCINE) 1000 UNIT/ML DIALYSIS: 1000.0000 [IU] | INTRAMUSCULAR | Status: DC | PRN

## 2018-07-01 NOTE — Progress Notes (Signed)
Glenda [name] Progress Note  Subjective: no new c/o's.   Vitals:   07/01/18 1400 07/01/18 1430 07/01/18 1445 07/01/18 1458  BP: (!) 132/49 (!) 111/53 (!) 115/40 (!) 141/54  Pulse: (!) 57 63 60 61  Resp:    14  Temp:    (!) 97.5 F (36.4 C)  TempSrc:    Oral  SpO2:    98%  Weight:    61.2 kg  Height:        Inpatient medications: . aspirin EC  81 mg Oral QPM  . calcium acetate  667 mg Oral TID WC  . Chlorhexidine Gluconate Cloth  6 each Topical Q0600  . citalopram  20 mg Oral QPM  . clopidogrel  75 mg Oral Daily  . donepezil  5 mg Oral QHS  . insulin aspart  0-15 Units Subcutaneous TID WC  . insulin glargine  10 Units Subcutaneous QHS  . multivitamin  1 tablet Oral QHS  . pantoprazole  20 mg Oral QPM  . pravastatin  80 mg Oral QHS  . senna-docusate  1 tablet Oral QHS   . sodium chloride 10 mL/hr at 06/30/18 1040  . sodium chloride    . sodium chloride     sodium chloride, sodium chloride, acetaminophen **OR** acetaminophen, alteplase, famotidine, heparin, HYDROmorphone (DILAUDID) injection, lidocaine (PF), lidocaine-prilocaine, pentafluoroprop-tetrafluoroeth  Iron/TIBC/Ferritin/ %Sat    Component Value Date/Time   IRON 32 08/24/2017 0525   TIBC 167 (L) 08/24/2017 0525   FERRITIN 496 (H) 08/24/2017 0525   IRONPCTSAT 19 08/24/2017 0525    Exam: Gen elderly AAF, no distress, calm No jvd or bruits Chest clear bilat to bases RRR no MRG Abd soft ntnd no mass or ascites +bs Ext L AKA stump, bilat LE edema 1-2+ edema Neuro is alert, Ox 3 , nf    Home meds:  - aspirin 81/ isosorbide mono 30 qd/ pravastatin 80/ clopidogrel 75 qd  - metoprolol xl 25 qd/ clonidine 0.1 bid  - citalopram 20 mg hs/ donepezil 5 qd/ oxycodone - aceta prn  - pantoprazole 20 hs/ famotidine 10 prn/ calc acetate ac tid  - insulin 50/50 mix 20u am and 14u pm    MWF [facility]  4h 15min   59.5kg  2/2 bath  AVF  Hep 6000  - mircera 200 , last 2/26  - calc 0.5 ug  tiw   Assessment/ Plan: 1. Non-healing L BKA: now sp L AKA revision 3/6 2. ESRD on HD MWF. HD today then Monday.  3. HTN - bp's wnl 4. Dementia, mild  5. DM on insulin 6. Anemia CKD - Hb 10 > 7.6. Follow.  7. H/o CVA 8. H/o breast Ca      [facility] 07/01/2018, 3:28 PM  Recent Labs  Lab 06/30/18 1502 07/01/18 0343  NA 140 139  K 3.3* 4.6  CL 102 100  CO2 26 23  GLUCOSE 136* 121*  BUN 31* 39*  CREATININE 4.24* 5.07*  CALCIUM 9.2 8.6*   No results for input(s): AST, ALT, ALKPHOS, BILITOT, PROT in the last 168 hours. Recent Labs  Lab 06/30/18 1502 07/01/18 0343 07/01/18 0827  WBC 18.8* 13.8*  --   HGB 9.4* 7.6* 7.6*  HCT 31.5* 25.1* 25.2*  MCV 96.3 96.2  --   PLT 265 215  --

## 2018-07-01 NOTE — Progress Notes (Addendum)
  Progress Note    07/01/2018 7:36 AM 1 Day Post-Op  Subjective:  Sleeping - awakes easily.  Family member states she had a couple of pain pills through the night and she seems comfortable.   Afebrile HR 50's-60's NSR 329'J-188'C systolic 16% [id number]  Vitals:   06/30/18 2029 07/01/18 0400  BP: (!) 130/43 (!) 118/41  Pulse: 64 (!) 56  Resp: 17 13  Temp: 98.2 F (36.8 C) 97.7 F (36.5 C)  SpO2: 97% 97%    Physical Exam: Incisions:  bandage with scant drainage-otherwise clean and dry   CBC    Component Value Date/Time   WBC 13.8 (H) 07/01/2018 0343   RBC 2.61 (L) 07/01/2018 0343   HGB 7.6 (L) 07/01/2018 0343   HGB 11.0 (L) 05/07/2015 1251   HCT 25.1 (L) 07/01/2018 0343   HCT 34.0 (L) 05/07/2015 1251   PLT 215 07/01/2018 0343   PLT 207 05/07/2015 1251   MCV 96.2 07/01/2018 0343   MCV 84.0 05/07/2015 1251   MCH 29.1 07/01/2018 0343   MCHC 30.3 07/01/2018 0343   RDW 18.9 (H) 07/01/2018 0343   RDW 12.8 05/07/2015 1251   LYMPHSABS 1.2 08/24/2017 0525   LYMPHSABS 1.7 05/07/2015 1251   MONOABS 0.5 08/24/2017 0525   MONOABS 0.5 05/07/2015 1251   EOSABS 0.2 08/24/2017 0525   EOSABS 0.0 05/07/2015 1251   BASOSABS 0.0 08/24/2017 0525   BASOSABS 0.0 05/07/2015 1251    BMET    Component Value Date/Time   NA 139 07/01/2018 0343   NA 140 09/16/2015   NA 141 05/07/2015 1251   K 4.6 07/01/2018 0343   K 4.3 05/07/2015 1251   CL 100 07/01/2018 0343   CO2 23 07/01/2018 0343   CO2 22 05/07/2015 1251   GLUCOSE 121 (H) 07/01/2018 0343   GLUCOSE 110 05/07/2015 1251   BUN 39 (H) 07/01/2018 0343   BUN 34 (A) 09/16/2015   BUN 22.5 05/07/2015 1251   CREATININE 5.07 (H) 07/01/2018 0343   CREATININE 1.2 (H) 05/07/2015 1251   CALCIUM 8.6 (L) 07/01/2018 0343   CALCIUM 9.1 05/07/2015 1251   GFRNONAA 7 (L) 07/01/2018 0343   GFRAA 9 (L) 07/01/2018 0343    INR    Component Value Date/Time   INR 0.96 04/28/2018 1447     Intake/Output Summary (Last 24 hours) at 07/01/2018  0736 Last data filed at 06/30/2018 1445 Gross per 24 hour  Intake 135 ml  Output 50 ml  Net 85 ml     Assessment/Plan:  85 y.o. female is s/p left above knee amputation  1 Day Post-Op  -pt's bandage with scant drainage, otherwise clean and dry -will take down dressing tomorrow. -hgb decreased this am to 7.6 but did not have much blood loss (50cc) during procedure yesterday.  Stat H&H ordered.    Leontine Locket, PA-C [facility] [phone] 07/01/2018 7:36 AM   I have seen and evaluated the patient. I agree with the PA note as documented above.  POD#1 s/p L AKA.  Need to float right heel - large eschar from pressure wound.  Will remove dressing tomorrow to L AKA.  [name], MD [facility] [place] Office: [phone] Pager: [phone]

## 2018-07-01 NOTE — Progress Notes (Signed)
PT Cancellation Note  Patient Details Name: Glenda [name] MRN: [id number] DOB: 1934-03-14   Cancelled Treatment:    Reason Eval/Treat Not Completed: Patient at procedure or test/unavailable (HD). Will follow-up for PT evaluation as schedule permits.  Mabeline Caras, PT, DPT Acute Rehabilitation Services  Pager [phone] Office [place] 07/01/2018, 12:08 PM

## 2018-07-01 NOTE — Progress Notes (Signed)
Hemodialysis called to get report while I was ambulating another patient. Attempted to call back hemodialysis unit for report but no answer at the other end. Left a message to call us back.

## 2018-07-01 NOTE — Progress Notes (Addendum)
   Subjective: Glenda [name] is doing well this morning. She slept through the night. Her pain is well controlled. She has no sob, chest pain, n/v, fever or chills. She is anxious about her recovery. We spoke with her daughter, who hopes to discharge Glenda [name] home. The home is equipped with a lift and other assistive devices, and there is always someone at the home to care for Glenda [name]. Glenda [name] had a negative experience at the last rehabilitation facility she attended, and the family is hesitant about trying another. We discussed the severity of Glenda [name]'s AKA and the effect of her surgery on her mobility and projected recovery.   Objective:  Vital signs in last 24 hours: Vitals:   06/30/18 1800 06/30/18 2000 06/30/18 2029 07/01/18 0400  BP: (!) 114/39 (!) 124/42 (!) 130/43 (!) 118/41  Pulse: 63 63 64 (!) 56  Resp: 16 18 17 13   Temp:   98.2 F (36.8 C) 97.7 F (36.5 C)  TempSrc:   Oral Oral  SpO2: 98% 98% 97% 97%  Weight:      Height:       General: Sleeping older woman who awakens easily during the interview Cardiovascular: Regular rate and rhythm, 3/6 holosystolic murmur heard loudest at the heart base. No JVD. No edema in right leg or bilateral upper extremities. Right lower leg is warm with 2+ Dorsalis pedis and posterior tibial pulses Pulmonary: Lungs are clear to auscultation bilaterally.  Integumentary: warm and moist. Musculoskeletal: Left leg AKA. Some serosanguinous discharge appreciated on the wound covering. Right arm and leg weakness Neurologic: Alert and oriented x2. No focal neurologic deficits. Sensation intact throughout her right lower leg.  Assessment/Plan:  Active Problems:   Diabetes mellitus type 1, uncontrolled, insulin dependent (HCC)   GASTROESOPHAGEAL REFLUX DISEASE   Acute on chronic diastolic CHF (congestive heart failure) (HCC)   Essential hypertension   BKA stump complication (HCC)  S/p Left AKA: POD1 -- Appreciate VVS consultation  -- Continue  ASA & Plavix  -- Pravastatin  -- PT / OT  -- d/c percocet 2/2 concerns for poor renal clearance -- Pain management with prn IV dilaudid 0.5 mg.  ESRD on HD T/Th/Sat. Greatly appreciate nephrology consultation --nephrology has been consulted and she is scheduled for HD on 3/7 -- Continue home phoslo    Chronic Diastolic Heart Failure Hx Paroxsymal Atrial Fibrillation  HTN: BP 118/41 on 3/7 at 0400 .  -- Volume management per nephro --Telemetry  --Continue home isosorbide dinitrate 30mg  QHS --Continue home metoprolol 12.5 mg qhs --Continue home clonidine 0.1 mg qhs   Diabetes: Takes Humalog 50-50 20 units qAM and 45 units qPM.  --10 Lantus qhs, sliding scale tid w/ meals -- CBG checks  GERD: --continue home pantoprazole 20mg  qhs  FEN: No fluids, replete lytes prn, HH / Carb mod VTE ppx: SCDs Code Status: FULL    Dispo: Anticipated discharge in approximately 3 day(s).   LOS: 1 day   Glenda [name], Medical Student 07/01/2018, 8:26 AM Pager: [phone]  Attestation for Student Documentation:  I personally was present and performed or re-performed the history, physical exam and medical decision-making activities of this service and have verified that the service and findings are accurately documented in the student's note.  Glenda Ochs, MD 07/01/2018, 10:10 AM

## 2018-07-02 LAB — GLUCOSE, CAPILLARY
Glucose-Capillary: 202 mg/dL — ABNORMAL HIGH (ref 70–99)
Glucose-Capillary: 179 mg/dL — ABNORMAL HIGH (ref 70–99)
Glucose-Capillary: 62 mg/dL — ABNORMAL LOW (ref 70–99)
Glucose-Capillary: 92 mg/dL (ref 70–99)
Glucose-Capillary: 127 mg/dL — ABNORMAL HIGH (ref 70–99)

## 2018-07-02 MED ORDER — POLYETHYLENE GLYCOL 3350 17 G PO PACK
17.0000 g | PACK | Freq: Every day | ORAL | Status: DC
  Administered 2018-07-02 – 2018-07-04 (×3): 17 g via ORAL
  Filled 2018-07-02 (×4): qty 1

## 2018-07-02 MED ORDER — CHLORHEXIDINE GLUCONATE CLOTH 2 % EX PADS: 6.0000 | MEDICATED_PAD | Freq: Every day | CUTANEOUS | Status: DC

## 2018-07-02 MED ORDER — SENNOSIDES-DOCUSATE SODIUM 8.6-50 MG PO TABS
1.0000 | ORAL_TABLET | Freq: Two times a day (BID) | ORAL | Status: DC
  Administered 2018-07-02 – 2018-07-04 (×5): 1 via ORAL
  Filled 2018-07-02 (×5): qty 1

## 2018-07-02 NOTE — Progress Notes (Signed)
   Subjective: Ms. Renbarger reported feeling well this morning. She denies any pain. She has not had any bowel movements since she came to the hospital. No acute complaints. All questions and concerns addressed.   Objective:  Vital signs in last 24 hours: Vitals:   07/01/18 1609 07/01/18 2035 07/01/18 2100 07/02/18 0504  BP: (!) 140/40 (!) 137/39 (!) 134/48 (!) 145/40  Pulse: (!) 156 62 63 62  Resp: 15 13  15   Temp: 98.6 F (37 C) 98.4 F (36.9 C)  98.2 F (36.8 C)  TempSrc: Oral Oral  Oral  SpO2: 94% 96% 98% 100%  Weight:      Height:       Gen: seen comfortably laying in bed, no distress CV: RRR, no murmurs Skin: warm and dry   Assessment/Plan:  Principal Problem:   BKA stump complication (HCC) Active Problems:   Diabetes mellitus type 1, uncontrolled, insulin dependent (HCC)   Acute on chronic diastolic CHF (congestive heart failure) (HCC)   Essential hypertension   PAF (paroxysmal atrial fibrillation) (HCC)   PVD (peripheral vascular disease) (HCC)   Pressure injury of skin   Active Problems:   Diabetes mellitus type 1, uncontrolled, insulin dependent (HCC)   GASTROESOPHAGEAL REFLUX DISEASE   Acute on chronic diastolic CHF (congestive heart failure) (HCC)   Essential hypertension   BKA stump complication (HCC)  S/pLeftAKA: POD2 --Appreciate VVS consultation  -- Continue ASA &Plavix  -- Pravastatin  -- PT / OT -- Pain management with prnIVdilaudid0.5 mg.  ESRD onHD: Greatly appreciate nephrology consultation -- HD yesterday and then resume MWF  -- Continue home phoslo   Chronic Diastolic Heart Failure Hx Paroxsymal Atrial Fibrillation HTN:  -- Volume management per nephro --Telemetry  --Continuehome isosorbide dinitrate30mg QHS --Continue homemetoprolol12.5 mg qhs --Continue home clonidine 0.1 mg qhs  Diabetes: --10 Lantus qhs, sliding scale tid w/ meals -- CBG checks   Dispo: Anticipated discharge in approximately 1-2 day(s).    Mike Craze, DO 07/02/2018, 7:19 AM Pager: [phone]

## 2018-07-02 NOTE — Progress Notes (Signed)
[facility] Progress Note Patient Details:  Glenda [name] 07-31-1933 [id number]  Patient ID: Glenda [name], female   DOB: 1933/05/02, 85 y.o.   MRN: [id number]   Glenda [name] 07/02/2018, 9:13 AMCalled [employer] for left stump sock.

## 2018-07-02 NOTE — Progress Notes (Signed)
PT Cancellation Note  Patient Details Name: Kathelyn Gombos MRN: [id number] DOB: [birth year]   Cancelled Treatment:    Reason Eval/Treat Not Completed: Active bedrest order Will need updated activity orders prior to PT eval/treat.    Lanney Gins, PT, DPT Supplemental Physical Therapist 07/02/18 12:04 PM Pager: [phone] Office: [phone]

## 2018-07-02 NOTE — Progress Notes (Addendum)
  Progress Note    07/02/2018 9:06 AM 2 Days Post-Op  Subjective:  No complaints  Afebrile HR 50's-60's  016'P-537'S systolic 827% RA  Vitals:   07/01/18 2100 07/02/18 0504  BP: (!) 134/48 (!) 145/40  Pulse: 63 62  Resp:  15  Temp:  98.2 F (36.8 C)  SpO2: 98% 100%    Physical Exam: Incisions:  Clean and dry with staples in tact.    CBC    Component Value Date/Time   WBC 13.8 (H) 07/01/2018 0343   RBC 2.61 (L) 07/01/2018 0343   HGB 7.6 (L) 07/01/2018 0827   HGB 11.0 (L) 05/07/2015 1251   HCT 25.2 (L) 07/01/2018 0827   HCT 34.0 (L) 05/07/2015 1251   PLT 215 07/01/2018 0343   PLT 207 05/07/2015 1251   MCV 96.2 07/01/2018 0343   MCV 84.0 05/07/2015 1251   MCH 29.1 07/01/2018 0343   MCHC 30.3 07/01/2018 0343   RDW 18.9 (H) 07/01/2018 0343   RDW 12.8 05/07/2015 1251   LYMPHSABS 1.2 08/24/2017 0525   LYMPHSABS 1.7 05/07/2015 1251   MONOABS 0.5 08/24/2017 0525   MONOABS 0.5 05/07/2015 1251   EOSABS 0.2 08/24/2017 0525   EOSABS 0.0 05/07/2015 1251   BASOSABS 0.0 08/24/2017 0525   BASOSABS 0.0 05/07/2015 1251    BMET    Component Value Date/Time   NA 139 07/01/2018 0343   NA 140 09/16/2015   NA 141 05/07/2015 1251   K 4.6 07/01/2018 0343   K 4.3 05/07/2015 1251   CL 100 07/01/2018 0343   CO2 23 07/01/2018 0343   CO2 22 05/07/2015 1251   GLUCOSE 121 (H) 07/01/2018 0343   GLUCOSE 110 05/07/2015 1251   BUN 39 (H) 07/01/2018 0343   BUN 34 (A) 09/16/2015   BUN 22.5 05/07/2015 1251   CREATININE 5.07 (H) 07/01/2018 0343   CREATININE 1.2 (H) 05/07/2015 1251   CALCIUM 8.6 (L) 07/01/2018 0343   CALCIUM 9.1 05/07/2015 1251   GFRNONAA 7 (L) 07/01/2018 0343   GFRAA 9 (L) 07/01/2018 0343    INR    Component Value Date/Time   INR 0.96 04/28/2018 1447     Intake/Output Summary (Last 24 hours) at 07/02/2018 0786 Last data filed at 07/01/2018 1458 Gross per 24 hour  Intake -  Output 1500 ml  Net -1500 ml     Assessment/Plan:  85 y.o. female is s/p left  above knee amputation  2 Days Post-Op  -pt's incision looks good this morning -stump sock ordered -hgb 7.6 yesterday-transfusion per renal/primary team -pt wants to go home when ready for discharge   Leontine Locket, PA-C [facility] [phone] 07/02/2018 9:06 AM    I have seen and evaluated the patient. I agree with the PA note as documented above. Dressing removed to L AKA today and c/d/i with staples.  Incisions looks great.  Discussed will leave staples for 3 weeks.  Daughters want to take patient home.  Keep right heel floated and betadine paint to toe.    [name], MD [facility] [place] Office: [phone] Pager: [phone]

## 2018-07-02 NOTE — Evaluation (Signed)
Physical Therapy Evaluation Patient Details Name: Glenda [name] MRN: [id number] DOB: 1933-09-08 Today's Date: 07/02/2018   History of Present Illness  Patient is an 85 y/o female s/p L AKA on 06/30/2018. PMH significant for L BKA 05/26/18, CAD, CHF, DM, ESRD, HTN, HLD, CVA.     Clinical Impression  Patient admitted s/p above listed procedure. Patient requiring assist for all ADLs and mobility from family prior to admission. Patient with good motivation to participate with PT - does require physical assist for all aspects of mobility. Patient today unable to stand at bedside requiring lateral scoot transfer to recliner with Max A from PT to complete successfully. Patient and family members wishing to return home at d/c - will require 24/hr supervision and HHPT - if family feels they can not provide higher level of care, may need to consider SNF for short term rehab. PT to follow acutely.     Follow Up Recommendations Home health PT;Supervision/Assistance - 24 hour    Equipment Recommendations  None recommended by PT    Recommendations for Other Services       Precautions / Restrictions Precautions Precautions: Fall Restrictions Weight Bearing Restrictions: Yes LLE Weight Bearing: Non weight bearing      Mobility  Bed Mobility Overal bed mobility: Needs Assistance Bed Mobility: Supine to Sit     Supine to sit: Mod assist;Min assist     General bed mobility comments: for upright posture and to come to EOB  Transfers Overall transfer level: Needs assistance Equipment used: 1 person hand held assist Transfers: Lateral/Scoot Transfers          Lateral/Scoot Transfers: Max assist;From elevated surface General transfer comment: patient unable to come into standing at bedside with RW - lateral transfer to drop arm reclienr with max A and use of bed pad to complete successfully; patient with difficulty following commands.   Ambulation/Gait             General Gait  Details: n/t  Stairs            Wheelchair Mobility    Modified Rankin (Stroke Patients Only)       Balance Overall balance assessment: Needs assistance Sitting-balance support: Bilateral upper extremity supported;Feet supported Sitting balance-Leahy Scale: Fair Sitting balance - Comments: cueing for posture Postural control: Posterior lean;Right lateral lean;Left lateral lean                                   Pertinent Vitals/Pain Pain Assessment: Faces Faces Pain Scale: Hurts even more Pain Location: L residual limb with mobility Pain Descriptors / Indicators: Aching;Discomfort;Grimacing Pain Intervention(s): Limited activity within patient's tolerance;Monitored during session;Repositioned    Home Living Family/patient expects to be discharged to:: Private residence Living Arrangements: Children Available Help at Discharge: Family;Available 24 hours/day Type of Home: House Home Access: Ramped entrance     Home Layout: One level Home Equipment: Walker - 2 wheels;Wheelchair - [employer];Tub bench;Toilet riser;Walker - 4 wheels Additional Comments: daughter reports pt has had 24/7 supervision for the last 2 years    Prior Function Level of Independence: Needs assistance   Gait / Transfers Assistance Needed: use of w/c for all mobility; daughters often "picking up" patient for transfers  ADL's / Homemaking Assistance Needed: family assist with all ADLs and IADLs        Hand Dominance   Dominant Hand: Right    Extremity/Trunk Assessment   Upper Extremity Assessment  Upper Extremity Assessment: Defer to OT evaluation    Lower Extremity Assessment Lower Extremity Assessment: Generalized weakness(noted ulcer at R heel)    Cervical / Trunk Assessment Cervical / Trunk Assessment: Normal  Communication   Communication: No difficulties  Cognition Arousal/Alertness: Awake/alert Behavior During Therapy: WFL for tasks  assessed/performed Overall Cognitive Status: History of cognitive impairments - at baseline                                        General Comments General comments (skin integrity, edema, etc.): daughter present and supportive    Exercises     Assessment/Plan    PT Assessment Patient needs continued PT services  PT Problem List Decreased strength;Decreased activity tolerance;Decreased balance;Decreased mobility;Decreased knowledge of use of DME;Decreased safety awareness       PT Treatment Interventions DME instruction;Gait training;Functional mobility training;Therapeutic activities;Therapeutic exercise;Balance training;Patient/family education;Wheelchair mobility training    PT Goals (Current goals can be found in the Care Plan section)  Acute Rehab PT Goals Patient Stated Goal: regain mobility PT Goal Formulation: With patient Time For Goal Achievement: 07/16/18 Potential to Achieve Goals: Fair    Frequency Min 3X/week   Barriers to discharge        Co-evaluation               AM-PAC PT "6 Clicks" Mobility  Outcome Measure Help needed turning from your back to your side while in a flat bed without using bedrails?: A Lot Help needed moving from lying on your back to sitting on the side of a flat bed without using bedrails?: A Lot Help needed moving to and from a bed to a chair (including a wheelchair)?: A Lot Help needed standing up from a chair using your arms (e.g., wheelchair or bedside chair)?: Total Help needed to walk in hospital room?: Total Help needed climbing 3-5 steps with a railing? : Total 6 Click Score: 9    End of Session Equipment Utilized During Treatment: Gait belt Activity Tolerance: Patient tolerated treatment well Patient left: in chair;with call bell/phone within reach;with family/visitor present Nurse Communication: Mobility status PT Visit Diagnosis: Unsteadiness on feet (R26.81);Other abnormalities of gait and  mobility (R26.89);Muscle weakness (generalized) (M62.81)    Time: 8502-7741 PT Time Calculation (min) (ACUTE ONLY): 32 min   Charges:   PT Evaluation $PT Eval Moderate Complexity: 1 Mod PT Treatments $Therapeutic Activity: 8-22 mins        Lanney Gins, PT, DPT Supplemental Physical Therapist 07/02/18 2:10 PM Pager: [phone] Office: [phone]

## 2018-07-02 NOTE — Progress Notes (Signed)
[facility] Progress Note  Subjective: doing well today  Vitals:   07/01/18 1609 07/01/18 2035 07/01/18 2100 07/02/18 0504  BP: (!) 140/40 (!) 137/39 (!) 134/48 (!) 145/40  Pulse: (!) 156 62 63 62  Resp: 15 13  15   Temp: 98.6 F (37 C) 98.4 F (36.9 C)  98.2 F (36.8 C)  TempSrc: Oral Oral  Oral  SpO2: 94% 96% 98% 100%  Weight:      Height:        Inpatient medications: . aspirin EC  81 mg Oral QPM  . calcium acetate  667 mg Oral TID WC  . Chlorhexidine Gluconate Cloth  6 each Topical Q0600  . citalopram  20 mg Oral QPM  . clopidogrel  75 mg Oral Daily  . donepezil  5 mg Oral QHS  . insulin aspart  0-15 Units Subcutaneous TID WC  . insulin glargine  10 Units Subcutaneous QHS  . multivitamin  1 tablet Oral QHS  . pantoprazole  20 mg Oral QPM  . polyethylene glycol  17 g Oral Daily  . pravastatin  80 mg Oral QHS  . senna-docusate  1 tablet Oral BID   . sodium chloride 10 mL/hr at 06/30/18 1040   acetaminophen **OR** acetaminophen, alteplase, famotidine, HYDROmorphone (DILAUDID) injection  Iron/TIBC/Ferritin/ %Sat    Component Value Date/Time   IRON 32 08/24/2017 0525   TIBC 167 (L) 08/24/2017 0525   FERRITIN 496 (H) 08/24/2017 0525   IRONPCTSAT 19 08/24/2017 0525    Exam: Gen elderly AAF, no distress, calm No jvd or bruits Chest clear bilat to bases RRR no MRG Abd soft ntnd no mass or ascites +bs Ext L AKA stump, bilat LE edema 1-2+ edema Neuro is alert, Ox 3 , nf    Home meds:  - aspirin 81/ isosorbide mono 30 qd/ pravastatin 80/ clopidogrel 75 qd  - metoprolol xl 25 qd/ clonidine 0.1 bid  - citalopram 20 mg hs/ donepezil 5 qd/ oxycodone - aceta prn  - pantoprazole 20 hs/ famotidine 10 prn/ calc acetate ac tid  - insulin 50/50 mix 20u am and 14u pm    MWF [facility]  4h 35min   59.5kg  2/2 bath  AVF  Hep 6000  - mircera 200 , last 2/26  - calc 0.5 ug tiw   Assessment/ Plan: 1. Non-healing L BKA wound: sp L AKA revision 3/6 2. ESRD  on HD MWF. HD Monday, use 50% hep dose 3. HTN - bp's wnl 4. Dementia, mild  5. DM on insulin 6. Anemia CKD - Hb 10 > 7.6. Follow.  7. H/o CVA 8. H/o breast Ca      [facility] 07/02/2018, 11:48 AM  Recent Labs  Lab 06/30/18 1502 07/01/18 0343  NA 140 139  K 3.3* 4.6  CL 102 100  CO2 26 23  GLUCOSE 136* 121*  BUN 31* 39*  CREATININE 4.24* 5.07*  CALCIUM 9.2 8.6*   No results for input(s): AST, ALT, ALKPHOS, BILITOT, PROT in the last 168 hours. Recent Labs  Lab 06/30/18 1502 07/01/18 0343 07/01/18 0827  WBC 18.8* 13.8*  --   HGB 9.4* 7.6* 7.6*  HCT 31.5* 25.1* 25.2*  MCV 96.3 96.2  --   PLT 265 215  --

## 2018-07-02 NOTE — Progress Notes (Signed)
cbg was 62 gave pt food rechecked came up to 92 informed doctor no change in orders

## 2018-07-03 LAB — GLUCOSE, CAPILLARY
Glucose-Capillary: 63 mg/dL — ABNORMAL LOW (ref 70–99)
Glucose-Capillary: 65 mg/dL — ABNORMAL LOW (ref 70–99)
Glucose-Capillary: 86 mg/dL (ref 70–99)
Glucose-Capillary: 119 mg/dL — ABNORMAL HIGH (ref 70–99)
Glucose-Capillary: 45 mg/dL — ABNORMAL LOW (ref 70–99)
Glucose-Capillary: 55 mg/dL — ABNORMAL LOW (ref 70–99)
Glucose-Capillary: 195 mg/dL — ABNORMAL HIGH (ref 70–99)
Glucose-Capillary: 150 mg/dL — ABNORMAL HIGH (ref 70–99)

## 2018-07-03 LAB — BASIC METABOLIC PANEL
Anion gap: 11 (ref 5–15)
BUN: 28 mg/dL — ABNORMAL HIGH (ref 8–23)
CO2: 27 mmol/L (ref 22–32)
Calcium: 8.4 mg/dL — ABNORMAL LOW (ref 8.9–10.3)
Chloride: 98 mmol/L (ref 98–111)
Creatinine, Ser: 4.17 mg/dL — ABNORMAL HIGH (ref 0.44–1.00)
GFR calc Af Amer: 11 mL/min — ABNORMAL LOW (ref >60)
GFR calc non Af Amer: 9 mL/min — ABNORMAL LOW (ref >60)
Glucose, Bld: 77 mg/dL (ref 70–99)
Potassium: 3.3 mmol/L — ABNORMAL LOW (ref 3.5–5.1)
Sodium: 136 mmol/L (ref 135–145)

## 2018-07-03 LAB — IRON AND TIBC
Iron: 12 ug/dL — ABNORMAL LOW (ref 28–170)
Saturation Ratios: 10 % — ABNORMAL LOW (ref 10.4–31.8)
TIBC: 115 ug/dL — ABNORMAL LOW (ref 250–450)
UIBC: 103 ug/dL

## 2018-07-03 LAB — FERRITIN: Ferritin: 1403 ng/mL — ABNORMAL HIGH (ref 11–307)

## 2018-07-03 MED ORDER — HYDROMORPHONE HCL 1 MG/ML IJ SOLN
INTRAMUSCULAR | Status: AC
  Administered 2018-07-03: 0.5 mg via INTRAVENOUS
  Filled 2018-07-03: qty 0.5

## 2018-07-03 MED ORDER — INSULIN GLARGINE 100 UNIT/ML ~~LOC~~ SOLN
5.0000 [IU] | Freq: Every day | SUBCUTANEOUS | Status: DC
  Filled 2018-07-03: qty 0.05

## 2018-07-03 MED ORDER — DEXTROSE 50 % IV SOLN
INTRAVENOUS | Status: AC
  Administered 2018-07-03: 25 mL
  Filled 2018-07-03: qty 50

## 2018-07-03 MED ORDER — INSULIN ASPART 100 UNIT/ML ~~LOC~~ SOLN
0.0000 [IU] | Freq: Three times a day (TID) | SUBCUTANEOUS | Status: DC
  Administered 2018-07-04: 7 [IU] via SUBCUTANEOUS

## 2018-07-03 MED ORDER — DARBEPOETIN ALFA 100 MCG/0.5ML IJ SOSY
PREFILLED_SYRINGE | INTRAMUSCULAR | Status: AC
  Filled 2018-07-03: qty 0.5

## 2018-07-03 MED ORDER — DARBEPOETIN ALFA 100 MCG/0.5ML IJ SOSY
100.0000 ug | PREFILLED_SYRINGE | INTRAMUSCULAR | Status: DC
  Administered 2018-07-03: 100 ug via INTRAVENOUS
  Filled 2018-07-03: qty 0.5

## 2018-07-03 NOTE — Progress Notes (Addendum)
   Subjective: No acute events overnight. The nurse mentioned that Glenda [name] had a low blood glucose reading this morning. Upon interview, Glenda [name] is doing well. She is sore, but her post-operative pain is well controlled. She was seen by physical therapy, where she had difficulty getting out of bed. Her family is intent on discharge back to home instead of a SNF.  Objective:  Vital signs in last 24 hours: Vitals:   07/02/18 0504 07/02/18 1617 07/02/18 2004 07/03/18 0509  BP: (!) 145/40 (!) 139/50 (!) 139/39 (!) 136/44  Pulse: 62 61 65 69  Resp: 15 17  18   Temp: 98.2 F (36.8 C) 98 F (36.7 C) 98.8 F (37.1 C) 98.4 F (36.9 C)  TempSrc: Oral Oral Oral Oral  SpO2: 100% 100% 100% 94%  Weight:      Height:       Physical Exam: General: well-appearing elderly woman resting in the recliner Cardiovascular: regular rate and rhythm, holosystolic murmur heard loudest at the heart base Respiratory: lungs are cta bilaterally Extremities: Left AKA is well-dressed without significant discharge. Right leg is warm with 2+ dorsalis pedis and posterior tibial pulses, edema is improved from initial exam Neurologic: Alert and oriented x3  Assessment/Plan:  Principal Problem:   BKA stump complication (HCC) Active Problems:   Diabetes mellitus type 1, uncontrolled, insulin dependent (HCC)   Acute on chronic diastolic CHF (congestive heart failure) (HCC)   Essential hypertension   PAF (paroxysmal atrial fibrillation) (HCC)   PVD (peripheral vascular disease) (HCC)   Pressure injury of skin  S/pLeftAKA:POD3 --Appreciate VVS consultation  -- Continue ASA &Plavix  -- Pravastatin  -- PT / OT -- Pain management withprnIVdilaudid0.5 mg; transition to po dilaudid on discahrge   ESRD onHD: Greatly appreciate nephrology consultation -- HD 3/7 and then resume MWF  -- Continue home phoslo   Chronic Diastolic Heart Failure Hx Paroxsymal Atrial Fibrillation HTN:  -- Volume  management per nephro --Telemetry  --Continuehome isosorbide dinitrate30mg QHS --Continue homemetoprolol12.5 mg qhs --Continue home clonidine 0.1 mg qhs  Diabetes: Glenda [name]'s cbg was 62 on 3/8 afternoon, and 65 on 3/9 am. She had been receiving basal lantus 10   units qhs with sliding scale tid w/ meals. Her basal dose should be reduced. -- decrease to 5 Lantus qhs -- Decrease SSI TID AC to sensitive  -- CBG checks  Dispo: Anticipated discharge in approximately 2 day(s).   LOS: 3 days   Noralyn Pick, Medical Student 07/03/2018, 8:28 AM Pager: [phone]  Attestation for Student Documentation:  I personally was present and performed or re-performed the history, physical exam and medical decision-making activities of this service and have verified that the service and findings are accurately documented in the student's note.  Velna Ochs, MD 07/03/2018, 2:54 PM

## 2018-07-03 NOTE — Progress Notes (Signed)
OT Cancellation Note  Patient Details Name: Glenda [name] MRN: [id number] DOB: [birth year]   Cancelled Treatment:    Reason Eval/Treat Not Completed: Patient at procedure or test/ unavailable (HD); will follow up as schedule permits and as pt is available.   Lou Cal, OT [facility] Pager [phone] Office [phone]   Raymondo Band 07/03/2018, 2:57 PM

## 2018-07-03 NOTE — Progress Notes (Addendum)
  [facility] Progress Note   Assessment/ Plan:   MWF [facility] 4h 21min 59.5kg 2/2 bath AVF Hep 6000 - mircera 200 , last 2/26 - calc 0.5 ug tiw   Assessment/ Plan: 1. Non-healing L BKA wound/PAD: sp L AKA revision 3/6 VVS.  Doing well and working with PT/OT.  Home with family vs SNF.  R great toe and heel ulcer, float heel and protective boot in place 2. ESRD on HD MWF. HD today, decreased heparin. 3. HTN - bp's wnl 4. Dementia, mild - on Aricept 5. DM on insulin 6. Anemia CKD - Hb 10 > 7.6. Redosing ESA today, adding iron panel 7. H/o CVA- on Plavis 8. H/o breast Ca  Subjective:    Seen in room- sitting in chair.  Wearing O2, feeling well.  No pain in LLE   Objective:   BP (!) 136/44 (BP Location: Right Arm)   Pulse 69   Temp 98.4 F (36.9 C) (Oral)   Resp 18   Ht 5\' 6"  (1.676 m)   Wt 61.2 kg   SpO2 94%   BMI 21.78 kg/m   Physical Exam: ZOX:WRUEA woman, NAD CVS: RRR no m/r/g Resp: wearing O2, bibasilar crackles L > R Abd: soft nontender Ext: RLE in protective boot with sock, per pt has ulcer on R great toe and heel (not examined), LLE s/p AKA in sock  Labs: BMET Recent Labs  Lab 06/30/18 1041 06/30/18 1502 07/01/18 0343 07/03/18 0333  NA 138 140 139 136  K 4.1 3.3* 4.6 3.3*  CL  --  102 100 98  CO2  --  26 23 27   GLUCOSE 132* 136* 121* 77  BUN  --  31* 39* 28*  CREATININE  --  4.24* 5.07* 4.17*  CALCIUM  --  9.2 8.6* 8.4*   CBC Recent Labs  Lab 06/30/18 1041 06/30/18 1502 07/01/18 0343 07/01/18 0827  WBC  --  18.8* 13.8*  --   HGB 10.5* 9.4* 7.6* 7.6*  HCT 31.0* 31.5* 25.1* 25.2*  MCV  --  96.3 96.2  --   PLT  --  265 215  --     @IMGRELPRIORS @ Medications:    . aspirin EC  81 mg Oral QPM  . calcium acetate  667 mg Oral TID WC  . Chlorhexidine Gluconate Cloth  6 each Topical Q0600  . citalopram  20 mg Oral QPM  . clopidogrel  75 mg Oral Daily  . donepezil  5 mg Oral QHS  . insulin aspart  0-15 Units  Subcutaneous TID WC  . insulin glargine  10 Units Subcutaneous QHS  . multivitamin  1 tablet Oral QHS  . pantoprazole  20 mg Oral QPM  . polyethylene glycol  17 g Oral Daily  . pravastatin  80 mg Oral QHS  . senna-docusate  1 tablet Oral BID     Madelon Lips MD [facility] pgr [phone] 07/03/2018, 12:00 PM

## 2018-07-03 NOTE — Progress Notes (Signed)
[facility] [place]  Subjective  - No complaints.   Objective (!) 136/44 69 98.4 F (36.9 C) (Oral) 18 94%  Intake/Output Summary (Last 24 hours) at 07/03/2018 0744 Last data filed at 07/02/2018 1359 Gross per 24 hour  Intake 120 ml  Output -  Net 120 ml    L AKA with staples c/d/i Right DP palpable  Laboratory Lab Results: Recent Labs    06/30/18 1502 07/01/18 0343 07/01/18 0827  WBC 18.8* 13.8*  --   HGB 9.4* 7.6* 7.6*  HCT 31.5* 25.1* 25.2*  PLT 265 215  --    BMET Recent Labs    07/01/18 0343 07/03/18 0333  NA 139 136  K 4.6 3.3*  CL 100 98  CO2 23 27  GLUCOSE 121* 77  BUN 39* 28*  CREATININE 5.07* 4.17*  CALCIUM 8.6* 8.4*    COAG Lab Results  Component Value Date   INR 0.96 04/28/2018   INR 0.99 04/28/2018   INR 1.00 10/12/2017   No results found for: PTT  Assessment/Planning:  Left AKA c/d/i and looks great.  Stump stock in place.  Palpable right DP and keep floating heel.  Call vascular with questions or concerns.  [name] 07/03/2018 7:44 AM --

## 2018-07-03 NOTE — Progress Notes (Signed)
Inpatient Diabetes Program Recommendations  AACE/ADA: New Consensus Statement on Inpatient Glycemic Control (2015)  Target Ranges:  Prepandial:   less than 140 mg/dL      Peak postprandial:   less than 180 mg/dL (1-2 hours)      Critically ill patients:  140 - 180 mg/dL   Lab Results  Component Value Date   GLUCAP 86 07/03/2018   HGBA1C 6.4 (H) 06/30/2018    Review of Glycemic Control Results for Glenda [name], Glenda [name] (MRN [id number]) as of 07/03/2018 11:06  Ref. Range 07/03/2018 06:32 07/03/2018 07:04 07/03/2018 07:30  Glucose-Capillary Latest Ref Range: 70 - 99 mg/dL 65 (L) 63 (L) 86   Diabetes history: Type 2 DM Outpatient Diabetes medications: Humalog 50/50- 22 units QAM, 14 units QPM Current orders for Inpatient glycemic control: Lantus 10 units QHS, Novolog 0-15 units TID, Novolog 0-5 units QHS  Inpatient Diabetes Program Recommendations:    Noted patient having episodes of hypoglycemia with a FSBG of 65 mg/dL. Consider reducing Lantus to 8 units QHS and reducing Novolog to 0-9 units TID.   Thanks, Bronson Curb, MSN, RNC-OB Diabetes Coordinator [phone] (8a-5p)

## 2018-07-03 NOTE — Progress Notes (Addendum)
Pt returned from HD. CBG not checked in HD. CBG 45. Pt endorses feeling tired and sweaty. Pt is A&O x4. Hypoglycemia standing orders placed. Pt drank 8 oz of orange juice.   CBG recheck 55. Pt given 25 mL of D10. Dinner tray at bedside. Will reassess sugar.  1934 CBG 195 after eating dinner   Fritz Pickerel, RN

## 2018-07-04 ENCOUNTER — Ambulatory Visit: Payer: Medicare (Managed Care) | Admitting: Vascular Surgery

## 2018-07-04 DIAGNOSIS — E10649 Type 1 diabetes mellitus with hypoglycemia without coma: Secondary | ICD-10-CM

## 2018-07-04 DIAGNOSIS — Z7902 Long term (current) use of antithrombotics/antiplatelets: Secondary | ICD-10-CM

## 2018-07-04 DIAGNOSIS — E1051 Type 1 diabetes mellitus with diabetic peripheral angiopathy without gangrene: Secondary | ICD-10-CM

## 2018-07-04 DIAGNOSIS — Z7982 Long term (current) use of aspirin: Secondary | ICD-10-CM

## 2018-07-04 DIAGNOSIS — Z9862 Peripheral vascular angioplasty status: Secondary | ICD-10-CM

## 2018-07-04 DIAGNOSIS — Z79891 Long term (current) use of opiate analgesic: Secondary | ICD-10-CM

## 2018-07-04 DIAGNOSIS — T8789 Other complications of amputation stump: Principal | ICD-10-CM

## 2018-07-04 LAB — GLUCOSE, CAPILLARY
GLUCOSE-CAPILLARY: 168 mg/dL — AB (ref 70–99)
Glucose-Capillary: 316 mg/dL — ABNORMAL HIGH (ref 70–99)

## 2018-07-04 MED ORDER — HYDROMORPHONE HCL 2 MG PO TABS: 2.0000 mg | ORAL_TABLET | ORAL | 0 refills | Status: AC | PRN

## 2018-07-04 MED FILL — HYDROmorphone HCL 2 MG TABS: 2 | 5 days supply | Qty: 30 | Fill #0

## 2018-07-04 NOTE — Progress Notes (Addendum)
Subjective: Last evening after dialysis, Glenda [name] was found to have a capillary blood glucose of 45. She felt tremulous, fatigued, and sweaty at that time. She received some orange juice and dinner. Her blood glucose measurements improved after the food and her symptoms resolved. Her children held her insulin last night, as they were worried about a hypoglycemic event. This morning, Glenda [name] is doing well. Her pain is well controlled. She has no shortness of breath, chest pain, confusion, fever, or chills. She needs a lot of help to get around. She feels ready for discharge and would like to go home.   Objective:  Vital signs in last 24 hours: Vitals:   07/03/18 1826 07/03/18 1945 07/04/18 0434 07/04/18 0823  BP: (!) 151/46 (!) 128/43 (!) 135/44 (!) 124/53  Pulse:  79 81 77  Resp: 18 17 13 18   Temp:  98.6 F (37 C) 98.9 F (37.2 C) 98.1 F (36.7 C)  TempSrc:  Oral Oral Oral  SpO2:  94% 93% 98%  Weight:   64.9 kg   Height:       Physical examination: General: Well appearing friendly elderly woman  Cardiovascular: regular rate and rhythm, holosystolic murmur heard loudest at the hear base Respiratory: lungs are clear to auscultation bilaterally, good respiratory effort Extremities: left leg AKA is dressed with minimal serosanguinous discharge. Right leg is warm with 2+ dorsalis pedis and posterior tibial pulses. Trace edema Neurologic: Alert and oriented  Assessment/Plan:  Principal Problem:   BKA stump complication (HCC) Active Problems:   Diabetes mellitus type 1, uncontrolled, insulin dependent (HCC)   Acute on chronic diastolic CHF (congestive heart failure) (HCC)   Essential hypertension   PAF (paroxysmal atrial fibrillation) ([place])   PVD (peripheral vascular disease) ([place])   Pressure injury of skin  S/pLeftAKA:POD3. Glenda [name]'s family has had poor experiences with SNFs in the past. They would like to bring her directly home. She has 24/7 observation, and the home  is equipped with many assistive devices. PT did not suggest any specific assistive devices that the family would require before sending her home. Per PT note, she currently requires maximum assist in moving from the bed to a nearby chair. Family understands this and feels they are able to provide full assistance.  --Appreciate VVS consultation  -- Continue ASA &Plavix  -- Pravastatin  -- PT / OT -- Pain management withprnIVdilaudid0.5 mg; transition to po dilaudid on discahrge   ESRD onHD:Greatly appreciate nephrology consultation --HD 3/7 and then resume MWF -- Continue home phoslo   Chronic Diastolic Heart Failure Hx Paroxsymal Atrial Fibrillation HTN:  --Volume management per nephro --Telemetry  --Hold home anti-hypertensives given pt's consistently normotensive and some low diastolic BP (~10F)  Diabetes: Glenda [name]'s cbg was 45 after dialysis on 3/9. She was symptomatic. Her evening Lantus was held, and her sliding scale insulin was decreased to sensitive. Per family, pt has not been getting as much insulin as prescribed at home 2/2 fear of hypoglycemic episodes. Most recent CBG after breakfast was 316. --hold Lantus -- Decrease SSI TID AC to sensitive  -- CBG checks  Dispo: Anticipated discharge is today  LOS: 4 days   Noralyn Pick, Medical Student 07/04/2018, 11:16 AM Pager: [phone]  Attestation for Student Documentation:  I personally was present and performed or re-performed the history, physical exam and medical decision-making activities of this service and have verified that the service and findings are accurately documented in the student's note.  Velna Ochs, MD 07/04/2018,  11:49 AM

## 2018-07-04 NOTE — Discharge Summary (Signed)
Name: Glenda [name] MRN: [id number] DOB: 1933-08-28 85 y.o. PCP: Angelica Pou, MD  Date of Admission: 06/30/2018 10:00 AM Date of Discharge: 07/04/2018 Attending Physician: Dr. Lalla Brothers   Discharge Diagnosis: 1. Left above the knee amputation  Discharge Medications: Allergies as of 07/04/2018   No Known Allergies     Medication List    STOP taking these medications   insulin lispro protamine-lispro (50-50) 100 UNIT/ML Susp injection Commonly known as:  HUMALOG 50/50 MIX   isosorbide mononitrate 30 MG 24 hr tablet Commonly known as:  Imdur   metoprolol succinate 25 MG 24 hr tablet Commonly known as:  TOPROL-XL   oxyCODONE-acetaminophen 5-325 MG tablet Commonly known as:  PERCOCET/ROXICET     TAKE these medications   acetaminophen 500 MG tablet Commonly known as:  TYLENOL Take 1,000 mg by mouth at bedtime as needed for headache (pain).   acetaminophen 650 MG CR tablet Commonly known as:  TYLENOL Take 650 mg by mouth 3 (three) times daily.   aspirin 81 MG EC tablet Take 1 tablet (81 mg total) by mouth every evening. What changed:  when to take this   calcium acetate 667 MG capsule Commonly known as:  PHOSLO Take 667 mg by mouth 3 (three) times daily with meals.   citalopram 20 MG tablet Commonly known as:  CELEXA Take 20 mg by mouth daily.   clopidogrel 75 MG tablet Commonly known as:  PLAVIX Take 1 tablet (75 mg total) by mouth daily.   donepezil 5 MG tablet Commonly known as:  ARICEPT Take 5 mg by mouth at bedtime.   EMLA cream Generic drug:  lidocaine-prilocaine Apply 1 application topically See admin instructions. Apply topically prior to dialysis on Monday, Wednesday, Friday   famotidine 10 MG chewable tablet Commonly known as:  PEPCID AC Chew 10 mg by mouth daily as needed for heartburn.   feeding supplement (GLUCERNA SHAKE) Liqd Take 237 mLs by mouth daily.   feeding supplement (PRO-STAT SUGAR FREE 64) Liqd Take 30 mLs by  mouth 3 (three) times daily with meals.   HYDROmorphone 2 MG tablet Commonly known as:  Dilaudid Take 1 tablet (2 mg total) by mouth every 4 (four) hours as needed for moderate pain or severe pain.   multivitamin Tabs tablet Take 1 tablet by mouth daily.   pantoprazole 20 MG tablet Commonly known as:  PROTONIX Take 20 mg by mouth daily.   polyethylene glycol packet Commonly known as:  MIRALAX / GLYCOLAX Take 17 g by mouth daily. Mix in beverage of choice and drink   pravastatin 40 MG tablet Commonly known as:  PRAVACHOL Take 40 mg by mouth daily.   Senna S 8.6-50 MG tablet Generic drug:  senna-docusate Take 2 tablets by mouth at bedtime as needed (to maintain comfortable bowel patterns).       Disposition and follow-up:   [name] was discharged from [facility] in stable condition.  At the hospital follow up visit please address:   1.  Left above-the-knee amputation: [name] is healing well from her surgery on 3/7. She required an AKA revision of an earlier BKA due to poor wound healing. She was discharged no DAPT with aspirin and plavix. At discharge, she still needed maximum assist from physical therapy to transition from her bed to a chair. Family declined SNF. Discharged home per request with plans for home health PT through [facility].   2.  Diabetes Mellitus: [name] had multiple hypoglycemic events during  her hospitalization. Her insulin was held on discharge.   3.  Hypertension: [name]'s anti-hypertensive medications were initial ordered on admission but experienced some hypotension the next day. Medications were subsequently held and she remained normotensive for the remainder of the hospitalization. Instructed to continue holding medications until follow up with PCP.   4.  Post pain management: [name] was previously prescribed percocet for pain after her BKA. Discharged patient on oral dilaudid as this is safer with her ESRD. Advised  she not take oxycodone.   5.  Labs / imaging needed at time of follow-up: none  6.  Pending labs/ test needing follow-up: none  Follow-up Appointments:   Follow up with Dr. Carlis Abbott at [place] in 3 weeks for staple removal and evaluation of healing after your surgery  Follow up with your primary care provider within the next week to discuss your hospitalization and your chronic medical conditions.  Home health PT will come and assist you with your mobility needs   Hospital Course by problem list:  1. Left AKA: She has a history of PVD and was seen by vascular on 04/22/19 for right lower extremity critical limb ischemia and tissue loss with great toe dry gangrene. She underwent anterior tibial artery atherectomy and balloon angioplasty on 05/24/2018 by Dr. Carlis Abbott. She also had a non healing left toe amputation without further revascularization options, and required BKA on 05/26/18. She was discharged on dual antiplatelet therapy with aspirin and plavix to a SNF on 05/30/18. She was seen by vascular for follow up on 06/20/18 and noted to have an area of necrosis of her left BKA concerning for non-healing. She was admitted on 3/7 and underwent a successful left AKA. She had no complaints after the surgery. Her pain was controlled with dilaudid. Her left stump was dressed and expressed minimal serosanguinous drainage over the course of her hospitalization. Vascular surgery plans to remove the remaining staples and re-evaluate her healing 3 weeks after discharge. [name]'s mobility was severely diminished after her AKA. She required maximum assistance in transitioning from her bed to a nearby chair. She and her family desired discharge to their home over to a skilled nursing facility. The home has many assistive devices such as a lift and wheelchair and there is a family member with [name] at all times. She is being discharged with regular home health PT assistance.  2.  Diabetes: [name] takes Humalog 50-50 20 units qAM and 45 units qPM at home. In the hospital, she was started on Lantus 10 units and sliding scale tid w/ meals. She had multiple hypoglycemic episodes and insulin was held. Family reports they often hold her insulin at home and only give it if her CBGs are > 200. She was instructed to hold her insulin at discharge and follow up with her PCP. Due to her dialysis, she has decreased clearance of insulin and may required reduced dose or discontinuation.   3. ESRD on HD MWF: She received HD on Saturday, 3/7 then resumed her regular MWF schedule, receiving dialysis on 3/9.   3. Hypertension: Mrs. Funke's home anti-hypertensive medications were held during her hospitalization due to hypotension. Instructed to continue holding until follow up with PCP.    Discharge Vitals:   BP (!) 124/53 (BP Location: Right Arm)   Pulse 77   Temp 98.1 F (36.7 C) (Oral)   Resp 18   Ht 5\' 6"  (1.676 m)   Wt 64.9 kg  SpO2 98%   BMI 23.09 kg/m   Pertinent Labs, Studies, and Procedures:  CMP Latest Ref Rng & Units 07/03/2018 07/01/2018 06/30/2018  Glucose 70 - 99 mg/dL 77 121(H) 136(H)  BUN 8 - 23 mg/dL 28(H) 39(H) 31(H)  Creatinine 0.44 - 1.00 mg/dL 4.17(H) 5.07(H) 4.24(H)  Sodium 135 - 145 mmol/L 136 139 140  Potassium 3.5 - 5.1 mmol/L 3.3(L) 4.6 3.3(L)  Chloride 98 - 111 mmol/L 98 100 102  CO2 22 - 32 mmol/L 27 23 26   Calcium 8.9 - 10.3 mg/dL 8.4(L) 8.6(L) 9.2  Total Protein 6.5 - 8.1 g/dL - - -  Total Bilirubin 0.3 - 1.2 mg/dL - - -  Alkaline Phos 38 - 126 U/L - - -  AST 15 - 41 U/L - - -  ALT 0 - 44 U/L - - -   CBC Latest Ref Rng & Units 07/01/2018 07/01/2018 06/30/2018  WBC 4.0 - 10.5 K/uL - 13.8(H) 18.8(H)  Hemoglobin 12.0 - 15.0 g/dL 7.6(L) 7.6(L) 9.4(L)  Hematocrit 36.0 - 46.0 % 25.2(L) 25.1(L) 31.5(L)  Platelets 150 - 400 K/uL - 215 265   Iron/TIBC/Ferritin/ %Sat    Component Value Date/Time   IRON 12 (L) 07/03/2018 1211   TIBC 115 (L) 07/03/2018 1211    FERRITIN 1,403 (H) 07/03/2018 1211   IRONPCTSAT 10 (L) 07/03/2018 1211   HbA1c: 6.4 on 3/6  Discharge Instructions: Discharge Instructions    Call MD for:  redness, tenderness, or signs of infection (pain, swelling, redness, odor or green/yellow discharge around incision site)   Complete by:  As directed    Call MD for:  severe uncontrolled pain   Complete by:  As directed    Call MD for:  temperature >100.4   Complete by:  As directed    Diet - low sodium heart healthy   Complete by:  As directed    Discharge instructions   Complete by:  As directed    Mr. Carra,  It was a pleasure to take care of you. I am glad your surgery went well. Please hold your blood pressure medications (isosorbide and metoprolol) until you follow up with Dr. Jimmye Norman. I would also like for you to hold your insulin until follow up.   Continue your other medications as previously prescribed and resume your normal Monday, Wednesday, Friday dialysis schedule tomorrow.   For pain, I have given you dilaudid every 4 hours as needed. I would avoid other pain medications with your kidney disease. I have spoken with Dr. Jimmye Norman over the phone and updated her on your progress while in the hospital. Please follow up with her within the next few days.   Take care, Dr. Philipp Ovens   Increase activity slowly   Complete by:  As directed       Signed: Velna Ochs, MD 07/06/2018, 6:21 PM   Pager: [phone]

## 2018-07-04 NOTE — Progress Notes (Signed)
Physical Therapy Treatment Patient Details Name: Glenda [name] MRN: [id number] DOB: 11/19/1933 Today's Date: 07/04/2018    History of Present Illness Patient is an 85 y/o female s/p L AKA on 06/30/2018. PMH significant for L BKA 05/26/18, CAD, CHF, DM, ESRD, HTN, HLD, CVA.     PT Comments    Patient seen for activity progression and attempts to further perform mobility and transfers in prep for d/c home. Patient continues to require significant physical assist 2 person. Spoke with daughter at bedside regarding concerns for mobility and patient/family safety. Explained recommendations for SNF however family declining and wants to take patient home.  Encouraged use of hoyer lift for mobility and maximize home health services.   Follow Up Recommendations  Home health PT;Supervision/Assistance - 24 hour(continue to feel SNF most appropriate, family refusing)     Equipment Recommendations  None recommended by PT(recommend transfer by hoyer lift for family safety)    Recommendations for Other Services       Precautions / Restrictions Precautions Precautions: Fall Restrictions Weight Bearing Restrictions: Yes LLE Weight Bearing: Non weight bearing    Mobility  Bed Mobility Overal bed mobility: Needs Assistance Bed Mobility: Rolling;Supine to Sit Rolling: Mod assist(rolling bilateral directions in flat lines recliner)   Supine to sit: Mod assist;Min assist     General bed mobility comments: Moderate assist to elevate trunk to upright and min assist to reposition at EOB. Multi modal cues for positioning and techniquer  Transfers Overall transfer level: Needs assistance Equipment used: 2 person hand held assist(face to face with chuck pad) Transfers: Sit to/from [facility];Lateral/Scoot Transfers Sit to Stand: Max assist;From elevated surface Stand pivot transfers: Max assist;From elevated surface       General transfer comment: one attempt at Moderate assist  and during transition Max assist to pivot to [facility] with gait belt and chuck pad using face to face techniques and block out of RLE. Increased time and effort.   Ambulation/Gait             General Gait Details: n/t   Stairs             Wheelchair Mobility    Modified Rankin (Stroke Patients Only)       Balance Overall balance assessment: Needs assistance Sitting-balance support: Bilateral upper extremity supported;Feet supported Sitting balance-Leahy Scale: Fair Sitting balance - Comments: cueing for posture Postural control: Posterior lean;Right lateral lean;Left lateral lean Standing balance support: Bilateral upper extremity supported;During functional activity Standing balance-Leahy Scale: Zero Standing balance comment: unable to support herself in upright position                            Cognition Arousal/Alertness: Awake/alert Behavior During Therapy: WFL for tasks assessed/performed Overall Cognitive Status: History of cognitive impairments - at baseline                                        Exercises      General Comments        Pertinent Vitals/Pain Pain Assessment: Faces Faces Pain Scale: Hurts even more Pain Location: L residual limb with mobility Pain Descriptors / Indicators: Aching;Discomfort;Grimacing Pain Intervention(s): Monitored during session    Home Living                      Prior Function  PT Goals (current goals can now be found in the care plan section) Acute Rehab PT Goals Patient Stated Goal: regain mobility PT Goal Formulation: With patient Time For Goal Achievement: 07/16/18 Potential to Achieve Goals: Fair Progress towards PT goals: Progressing toward goals    Frequency    Min 3X/week      PT Plan      Co-evaluation PT/OT/SLP Co-Evaluation/Treatment: Yes Reason for Co-Treatment: Complexity of the patient's impairments (multi-system involvement) PT goals  addressed during session: Mobility/safety with mobility OT goals addressed during session: ADL's and self-care      AM-PAC PT "6 Clicks" Mobility   Outcome Measure  Help needed turning from your back to your side while in a flat bed without using bedrails?: A Lot Help needed moving from lying on your back to sitting on the side of a flat bed without using bedrails?: A Lot Help needed moving to and from a bed to a chair (including a wheelchair)?: A Lot Help needed standing up from a chair using your arms (e.g., wheelchair or bedside chair)?: Total Help needed to walk in hospital room?: Total Help needed climbing 3-5 steps with a railing? : Total 6 Click Score: 9    End of Session Equipment Utilized During Treatment: Gait belt Activity Tolerance: Patient tolerated treatment well Patient left: in chair;with call bell/phone within reach;with family/visitor present Nurse Communication: Mobility status PT Visit Diagnosis: Unsteadiness on feet (R26.81);Other abnormalities of gait and mobility (R26.89);Muscle weakness (generalized) (M62.81)     Time: 5726-2035 PT Time Calculation (min) (ACUTE ONLY): 39 min  Charges:  $Therapeutic Activity: 8-22 mins                     Alben Deeds, PT DPT  Board Certified Neurologic Specialist [place] Pager [phone] Office [place] 07/04/2018, 12:40 PM

## 2018-07-04 NOTE — Progress Notes (Signed)
Inpatient Diabetes Program Recommendations  AACE/ADA: New Consensus Statement on Inpatient Glycemic Control (2015)  Target Ranges:  Prepandial:   less than 140 mg/dL      Peak postprandial:   less than 180 mg/dL (1-2 hours)      Critically ill patients:  140 - 180 mg/dL   Lab Results  Component Value Date   GLUCAP 316 (H) 07/04/2018   HGBA1C 6.4 (H) 06/30/2018    Review of Glycemic Control Results for Glenda [name], Glenda [name] (MRN [id number]) as of 07/04/2018 11:37  Ref. Range 07/03/2018 11:10 07/03/2018 18:15 07/03/2018 18:48 07/03/2018 19:33 07/03/2018 20:58 07/04/2018 06:00 07/04/2018 11:14  Glucose-Capillary Latest Ref Range: 70 - 99 mg/dL 119 (H) 45 (L) 55 (L) 195 (H) 150 (H) 168 (H) 316 (H)   Diabetes history: DM1 Outpatient Diabetes medications: Humalog 50/50- 22 units QAM, 14 units QPM Current orders for Inpatient glycemic control: Novolog 0-15 units TID, Novolog 0-5 units QHS Inpatient Diabetes Program Recommendations:   Note hx. Of Type 1 DM and CBG's trending up. Consider resumption of Lantus 5 units daily.  Also please reduce Novolog correction to sensitive tid with meals.   Thanks,  Adah Perl, RN, BC-ADM Inpatient Diabetes Coordinator Pager [phone] (8a-5p)

## 2018-07-04 NOTE — Evaluation (Signed)
Occupational Therapy Evaluation Patient Details Name: Glenda [name] MRN: [id number] DOB: [birth year] Today's Date: 07/04/2018    History of Present Illness Patient is an 85 y/o female s/p L AKA on 06/30/2018. PMH significant for L BKA 05/26/18, CAD, CHF, DM, ESRD, HTN, HLD, CVA.    Clinical Impression   This 85 y/o female presents with the above. At baseline pt was receiving assist for ADL and transfer completion from family. Pt now with increased weakness, decreased endurance and functional status this session. Pt requiring two person assist for safe transfer completion at this time; requires minA for UB ADL and max-totalA (+2) for LB and toileting ADL. Pt will benefit from continued acute OT services and recommend SNF level therapy services at time of discharge given her current level of function. Per discussion with family, family preferring to take pt home at time of discharge. Educated family in recommendation for SNF level therapies and reason for recommendation as well as recommendations for use of equipment (including use of hoyer)  for increased safety if choosing to take pt home. Will continue to follow acutely to progress pt towards established OT goals.     Follow Up Recommendations  SNF;Supervision/Assistance - 24 hour    Equipment Recommendations  3 in 1 bedside commode;Other (comment)(hoyer lift)           Precautions / Restrictions Precautions Precautions: Fall Restrictions Weight Bearing Restrictions: Yes LLE Weight Bearing: Non weight bearing      Mobility Bed Mobility Overal bed mobility: Needs Assistance Bed Mobility: Rolling;Supine to Sit Rolling: Mod assist(rolling bilateral directions in flat lines recliner)   Supine to sit: Mod assist;Min assist     General bed mobility comments: Moderate assist to elevate trunk to upright and min assist to reposition at EOB. Multi modal cues for positioning and techniquer  Transfers Overall transfer level: Needs  assistance Equipment used: 2 person hand held assist(face to face with chuck pad) Transfers: Sit to/from [facility];Lateral/Scoot Transfers Sit to Stand: Max assist;From elevated surface Stand pivot transfers: Max assist;From elevated surface       General transfer comment: one attempt at Moderate assist and during transition Max assist to pivot to [facility] with gait belt and chuck pad using face to face techniques and block out of RLE. Increased time and effort.     Balance Overall balance assessment: Needs assistance Sitting-balance support: Bilateral upper extremity supported;Feet supported Sitting balance-Leahy Scale: Fair Sitting balance - Comments: cueing for posture Postural control: Posterior lean;Right lateral lean;Left lateral lean Standing balance support: Bilateral upper extremity supported;During functional activity Standing balance-Leahy Scale: Zero Standing balance comment: unable to support herself in upright position                           ADL either performed or assessed with clinical judgement   ADL Overall ADL's : Needs assistance/impaired Eating/Feeding: Set up;Sitting   Grooming: Set up;Minimal assistance;Wash/dry hands;Sitting Grooming Details (indicate cue type and reason): minA for thoroughness Upper Body Bathing: Minimal assistance;Sitting Upper Body Bathing Details (indicate cue type and reason): encouragement to perform on her own     Upper Body Dressing : Min guard;Minimal assistance;Sitting Upper Body Dressing Details (indicate cue type and reason): doffing/donning new gown Lower Body Dressing: Maximal assistance;Total assistance;+2 for physical assistance;+2 for safety/equipment;Sitting/lateral leans;Sit to/from stand   Toilet Transfer: Moderate assistance;+2 for physical assistance;+2 for safety/equipment;Squat-pivot;BSC   Toileting- Clothing Manipulation and Hygiene: Total assistance;+2 for physical assistance;+2 for  safety/equipment;Sitting/lateral lean;Sit  to/from stand Toileting - Water quality scientist Details (indicate cue type and reason): for peri-care after BM; completed standing from [facility] with physical assist to stand as well as rolling in supine once in recliner as pt continuing to have BM with movement     Functional mobility during ADLs: Moderate assistance;Maximal assistance;+2 for physical assistance;+2 for safety/equipment General ADL Comments: pt limited due to pain, weakness, decreased endurance     Vision         Perception     Praxis      Pertinent Vitals/Pain Pain Assessment: Faces Faces Pain Scale: Hurts even more Pain Location: L residual limb with mobility Pain Descriptors / Indicators: Aching;Discomfort;Grimacing Pain Intervention(s): Monitored during session;Repositioned     Hand Dominance Right   Extremity/Trunk Assessment Upper Extremity Assessment Upper Extremity Assessment: RUE deficits/detail RUE Deficits / Details: pt with hx of CVA and residual R side weakness   Lower Extremity Assessment Lower Extremity Assessment: Defer to PT evaluation       Communication Communication Communication: No difficulties   Cognition Arousal/Alertness: Awake/alert Behavior During Therapy: WFL for tasks assessed/performed Overall Cognitive Status: History of cognitive impairments - at baseline                                     General Comments  discussion held with daughter regarding pt's current level of assist and therapy recommendation for SNF; discussed need for mechanical/hoyer lift for transfers if choosing to return home    Exercises     Shoulder Instructions      Home Living Family/patient expects to be discharged to:: Private residence Living Arrangements: Children Available Help at Discharge: Family;Available 24 hours/day Type of Home: House Home Access: Ramped entrance     Home Layout: One level     Bathroom Shower/Tub: Arts administrator: Standard     Home Equipment: Environmental consultant - 2 wheels;Wheelchair - [employer];Tub bench;Toilet riser;Walker - 4 wheels   Additional Comments: daughter reports pt has had 24/7 supervision for the last 2 years      Prior Functioning/Environment Level of Independence: Needs assistance  Gait / Transfers Assistance Needed: use of w/c for all mobility; daughters often "picking up" patient for transfers ADL's / Homemaking Assistance Needed: family assist with all ADLs and IADLs            OT Problem List: Decreased strength;Decreased range of motion;Decreased activity tolerance;Impaired balance (sitting and/or standing);Decreased cognition;Decreased knowledge of use of DME or AE;Pain      OT Treatment/Interventions: Self-care/ADL training;Therapeutic exercise;Neuromuscular education;DME and/or AE instruction;Therapeutic activities;Patient/family education;Balance training    OT Goals(Current goals can be found in the care plan section) Acute Rehab OT Goals Patient Stated Goal: regain mobility OT Goal Formulation: With patient Time For Goal Achievement: 07/18/18 Potential to Achieve Goals: Fair  OT Frequency: Min 2X/week   Barriers to D/C:            Co-evaluation PT/OT/SLP Co-Evaluation/Treatment: Yes Reason for Co-Treatment: Complexity of the patient's impairments (multi-system involvement);For patient/therapist safety;To address functional/ADL transfers PT goals addressed during session: Mobility/safety with mobility OT goals addressed during session: ADL's and self-care;Strengthening/ROM      AM-PAC OT "6 Clicks" Daily Activity     Outcome Measure Help from another person eating meals?: A Little Help from another person taking care of personal grooming?: A Little Help from another person toileting, which includes using toliet, bedpan, or urinal?: A  Lot Help from another person bathing (including washing, rinsing, drying)?: A Lot Help from  another person to put on and taking off regular upper body clothing?: A Lot Help from another person to put on and taking off regular lower body clothing?: A Lot 6 Click Score: 14   End of Session Equipment Utilized During Treatment: Gait belt;Rolling walker Nurse Communication: Mobility status  Activity Tolerance: Patient tolerated treatment well Patient left: in chair;with call bell/phone within reach  OT Visit Diagnosis: Muscle weakness (generalized) (M62.81);Pain Pain - Right/Left: Left Pain - part of body: Leg                Time: 8485-9276 OT Time Calculation (min): 39 min Charges:  OT General Charges $OT Visit: 1 Visit OT Evaluation $OT Eval Moderate Complexity: 1 Mod OT Treatments $Self Care/Home Management : 8-22 mins  Lou Cal, OT Supplemental Rehabilitation Services Pager [phone] Office [phone]  Raymondo Band 07/04/2018, 2:05 PM

## 2018-07-04 NOTE — Progress Notes (Signed)
IV removed. Telemetry removed, CCMD notified.   Discharge paperwork given to pt and her daughters.   TOC pharmacy delivered dilaudid to room prior to discharge.  Fritz Pickerel, RN

## 2018-07-04 NOTE — Progress Notes (Signed)
[facility]  Subjective  - No complaints.   Objective (!) 124/53 77 98.1 F (36.7 C) (Oral) 18 98%  Intake/Output Summary (Last 24 hours) at 07/04/2018 0843 Last data filed at 07/03/2018 1739 Gross per 24 hour  Intake 180 ml  Output 974 ml  Net -794 ml    L AKA with staples c/d/i Right DP palpable  Laboratory Lab Results: Recent Labs    07/01/18 0827  HGB 7.6*  HCT 25.2*   BMET Recent Labs    07/03/18 0333  NA 136  K 3.3*  CL 98  CO2 27  GLUCOSE 77  BUN 28*  CREATININE 4.17*  CALCIUM 8.4*    COAG Lab Results  Component Value Date   INR 0.96 04/28/2018   INR 0.99 04/28/2018   INR 1.00 10/12/2017   No results found for: PTT  Assessment/Planning:  Left AKA c/d/i and looks great.  Stump stock in place.  Palpable right DP and keep floating heel.  OK for discharge from my standpoint.  Will arrange follow-up with me in 3 weeks for staple removal.  Call vascular with questions or concerns.  [name] 07/04/2018 8:43 AM --

## 2018-07-04 NOTE — Progress Notes (Signed)
CSW talked with [facility] SW Paulisa. They are aware patient will be going home once she is medically ready for discharge. Glenda [name] states the patient has family, home health care and [facility] to provided the needed support. [facility] will provide transportation.    Thurmond Butts, MSW, [place] Social Worker [phone]

## 2018-07-04 NOTE — Progress Notes (Signed)
  [facility] Progress Note   Assessment/ Plan:   MWF [facility] 4h 48min 59.5kg 2/2 bath AVF Hep 6000 - mircera 200 , last 2/26 - calc 0.5 ug tiw   Assessment/ Plan: 1. Non-healing L BKA wound/PAD: sp L AKA revision 3/6 VVS.  Doing well and working with PT/OT.  Home with family although SNF has been recommended.  Pt and one dtr declining SNF  R great toe and heel ulcer, float heel and protective boot in place 2. ESRD on HD MWF. HD 3/9, next planned HD 3/11 as OP.  Rec use 3000 u bolus heparin (half dose) x 1 week and then reassess to see if she need 6000 heparin. 3. HTN - bp's wnl, no BP meds 4. Dementia, mild - on Aricept 5. DM on SSI here 6. Anemia CKD - Hb 10 > 7.6. Redosed ESA 3/9.  Iron panel 3/9 with % sat 10, ferritin 1200 --> she will need Fe load as OP.   7. H/o CVA- on Plavix 8. H/o breast Ca 9. Dispo: SNF has been recommended, pt declining, for home today.  Subjective:    Seen in room- sitting in chair, just finished working with PT.     Objective:   BP (!) 124/53 (BP Location: Right Arm)   Pulse 77   Temp 98.1 F (36.7 C) (Oral)   Resp 18   Ht 5\' 6"  (1.676 m)   Wt 64.9 kg   SpO2 98%   BMI 23.09 kg/m   Physical Exam: SWF:UXNAT woman, NAD CVS: RRR no m/r/g Resp: off O2, no crackles Abd: soft nontender Ext: RLE in protective boot with sock, per pt has ulcer on R great toe and heel (not examined), LLE s/p AKA in sock  Labs: BMET Recent Labs  Lab 06/30/18 1041 06/30/18 1502 07/01/18 0343 07/03/18 0333  NA 138 140 139 136  K 4.1 3.3* 4.6 3.3*  CL  --  102 100 98  CO2  --  26 23 27   GLUCOSE 132* 136* 121* 77  BUN  --  31* 39* 28*  CREATININE  --  4.24* 5.07* 4.17*  CALCIUM  --  9.2 8.6* 8.4*   CBC Recent Labs  Lab 06/30/18 1041 06/30/18 1502 07/01/18 0343 07/01/18 0827  WBC  --  18.8* 13.8*  --   HGB 10.5* 9.4* 7.6* 7.6*  HCT 31.0* 31.5* 25.1* 25.2*  MCV  --  96.3 96.2  --   PLT  --  265 215  --      @IMGRELPRIORS @ Medications:    . aspirin EC  81 mg Oral QPM  . calcium acetate  667 mg Oral TID WC  . Chlorhexidine Gluconate Cloth  6 each Topical Q0600  . citalopram  20 mg Oral QPM  . clopidogrel  75 mg Oral Daily  . darbepoetin (ARANESP) injection - DIALYSIS  100 mcg Intravenous Q Mon-HD  . donepezil  5 mg Oral QHS  . insulin aspart  0-9 Units Subcutaneous TID WC  . multivitamin  1 tablet Oral QHS  . pantoprazole  20 mg Oral QPM  . polyethylene glycol  17 g Oral Daily  . pravastatin  80 mg Oral QHS  . senna-docusate  1 tablet Oral BID     Madelon Lips MD [facility] pgr [phone] 07/04/2018, 12:24 PM

## 2018-07-19 ENCOUNTER — Inpatient Hospital Stay (HOSPITAL_COMMUNITY)
Admission: EM | Admit: 2018-07-19 | Discharge: 2018-07-24 | DRG: 871 | Disposition: A | Discharge status: Home / Self Care | Payer: Medicare (Managed Care) | Attending: Internal Medicine | Admitting: Internal Medicine

## 2018-07-19 ENCOUNTER — Emergency Department (HOSPITAL_COMMUNITY): Payer: Medicare (Managed Care)

## 2018-07-19 ENCOUNTER — Other Ambulatory Visit: Payer: Self-pay

## 2018-07-19 ENCOUNTER — Encounter (HOSPITAL_COMMUNITY): Payer: Self-pay | Admitting: *Deleted

## 2018-07-19 DIAGNOSIS — E1022 Type 1 diabetes mellitus with diabetic chronic kidney disease: Secondary | ICD-10-CM | POA: Diagnosis not present

## 2018-07-19 DIAGNOSIS — K219 Gastro-esophageal reflux disease without esophagitis: Secondary | ICD-10-CM | POA: Diagnosis present

## 2018-07-19 DIAGNOSIS — N186 End stage renal disease: Secondary | ICD-10-CM | POA: Diagnosis not present

## 2018-07-19 DIAGNOSIS — D72829 Elevated white blood cell count, unspecified: Secondary | ICD-10-CM | POA: Diagnosis present

## 2018-07-19 DIAGNOSIS — K649 Unspecified hemorrhoids: Secondary | ICD-10-CM | POA: Diagnosis not present

## 2018-07-19 DIAGNOSIS — E1052 Type 1 diabetes mellitus with diabetic peripheral angiopathy with gangrene: Secondary | ICD-10-CM | POA: Diagnosis present

## 2018-07-19 DIAGNOSIS — I2510 Atherosclerotic heart disease of native coronary artery without angina pectoris: Secondary | ICD-10-CM | POA: Diagnosis present

## 2018-07-19 DIAGNOSIS — I714 Abdominal aortic aneurysm, without rupture, unspecified: Secondary | ICD-10-CM

## 2018-07-19 DIAGNOSIS — I132 Hypertensive heart and chronic kidney disease with heart failure and with stage 5 chronic kidney disease, or end stage renal disease: Secondary | ICD-10-CM | POA: Diagnosis not present

## 2018-07-19 DIAGNOSIS — A419 Sepsis, unspecified organism: Secondary | ICD-10-CM | POA: Diagnosis not present

## 2018-07-19 DIAGNOSIS — Z992 Dependence on renal dialysis: Secondary | ICD-10-CM | POA: Diagnosis not present

## 2018-07-19 DIAGNOSIS — Z8249 Family history of ischemic heart disease and other diseases of the circulatory system: Secondary | ICD-10-CM

## 2018-07-19 DIAGNOSIS — Z833 Family history of diabetes mellitus: Secondary | ICD-10-CM

## 2018-07-19 DIAGNOSIS — N2581 Secondary hyperparathyroidism of renal origin: Secondary | ICD-10-CM | POA: Diagnosis present

## 2018-07-19 DIAGNOSIS — I5033 Acute on chronic diastolic (congestive) heart failure: Secondary | ICD-10-CM | POA: Diagnosis present

## 2018-07-19 DIAGNOSIS — IMO0002 Reserved for concepts with insufficient information to code with codable children: Secondary | ICD-10-CM | POA: Diagnosis present

## 2018-07-19 DIAGNOSIS — L97519 Non-pressure chronic ulcer of other part of right foot with unspecified severity: Secondary | ICD-10-CM | POA: Diagnosis not present

## 2018-07-19 DIAGNOSIS — I480 Paroxysmal atrial fibrillation: Secondary | ICD-10-CM | POA: Diagnosis present

## 2018-07-19 DIAGNOSIS — Z9861 Coronary angioplasty status: Secondary | ICD-10-CM

## 2018-07-19 DIAGNOSIS — E8889 Other specified metabolic disorders: Secondary | ICD-10-CM | POA: Diagnosis present

## 2018-07-19 DIAGNOSIS — Z79891 Long term (current) use of opiate analgesic: Secondary | ICD-10-CM

## 2018-07-19 DIAGNOSIS — F0390 Unspecified dementia without behavioral disturbance: Secondary | ICD-10-CM | POA: Diagnosis present

## 2018-07-19 DIAGNOSIS — I739 Peripheral vascular disease, unspecified: Secondary | ICD-10-CM | POA: Diagnosis present

## 2018-07-19 DIAGNOSIS — D631 Anemia in chronic kidney disease: Secondary | ICD-10-CM | POA: Diagnosis present

## 2018-07-19 DIAGNOSIS — Z79899 Other long term (current) drug therapy: Secondary | ICD-10-CM

## 2018-07-19 DIAGNOSIS — Z923 Personal history of irradiation: Secondary | ICD-10-CM

## 2018-07-19 DIAGNOSIS — I998 Other disorder of circulatory system: Secondary | ICD-10-CM | POA: Diagnosis present

## 2018-07-19 DIAGNOSIS — E876 Hypokalemia: Secondary | ICD-10-CM | POA: Diagnosis present

## 2018-07-19 DIAGNOSIS — L97419 Non-pressure chronic ulcer of right heel and midfoot with unspecified severity: Secondary | ICD-10-CM | POA: Diagnosis not present

## 2018-07-19 DIAGNOSIS — Z794 Long term (current) use of insulin: Secondary | ICD-10-CM

## 2018-07-19 DIAGNOSIS — Z8673 Personal history of transient ischemic attack (TIA), and cerebral infarction without residual deficits: Secondary | ICD-10-CM

## 2018-07-19 DIAGNOSIS — E785 Hyperlipidemia, unspecified: Secondary | ICD-10-CM | POA: Diagnosis present

## 2018-07-19 DIAGNOSIS — Z7982 Long term (current) use of aspirin: Secondary | ICD-10-CM

## 2018-07-19 DIAGNOSIS — E1065 Type 1 diabetes mellitus with hyperglycemia: Secondary | ICD-10-CM | POA: Diagnosis present

## 2018-07-19 DIAGNOSIS — Z803 Family history of malignant neoplasm of breast: Secondary | ICD-10-CM

## 2018-07-19 DIAGNOSIS — Z89612 Acquired absence of left leg above knee: Secondary | ICD-10-CM | POA: Diagnosis not present

## 2018-07-19 DIAGNOSIS — R509 Fever, unspecified: Secondary | ICD-10-CM | POA: Diagnosis present

## 2018-07-19 DIAGNOSIS — Z89512 Acquired absence of left leg below knee: Secondary | ICD-10-CM | POA: Diagnosis not present

## 2018-07-19 DIAGNOSIS — Z8719 Personal history of other diseases of the digestive system: Secondary | ICD-10-CM

## 2018-07-19 DIAGNOSIS — Z6820 Body mass index (BMI) 20.0-20.9, adult: Secondary | ICD-10-CM | POA: Diagnosis not present

## 2018-07-19 DIAGNOSIS — Z800 Family history of malignant neoplasm of digestive organs: Secondary | ICD-10-CM

## 2018-07-19 DIAGNOSIS — I70261 Atherosclerosis of native arteries of extremities with gangrene, right leg: Secondary | ICD-10-CM | POA: Diagnosis not present

## 2018-07-19 DIAGNOSIS — E46 Unspecified protein-calorie malnutrition: Secondary | ICD-10-CM | POA: Diagnosis present

## 2018-07-19 DIAGNOSIS — E10621 Type 1 diabetes mellitus with foot ulcer: Secondary | ICD-10-CM | POA: Diagnosis not present

## 2018-07-19 DIAGNOSIS — M7989 Other specified soft tissue disorders: Secondary | ICD-10-CM | POA: Diagnosis not present

## 2018-07-19 DIAGNOSIS — E1051 Type 1 diabetes mellitus with diabetic peripheral angiopathy without gangrene: Secondary | ICD-10-CM | POA: Diagnosis not present

## 2018-07-19 DIAGNOSIS — Z853 Personal history of malignant neoplasm of breast: Secondary | ICD-10-CM

## 2018-07-19 LAB — COMPREHENSIVE METABOLIC PANEL
ALT: 16 U/L (ref 0–44)
AST: 37 U/L (ref 15–41)
Albumin: 1.8 g/dL — ABNORMAL LOW (ref 3.5–5.0)
Alkaline Phosphatase: 276 U/L — ABNORMAL HIGH (ref 38–126)
Anion gap: 13 (ref 5–15)
BUN: 10 mg/dL (ref 8–23)
CALCIUM: 8.1 mg/dL — AB (ref 8.9–10.3)
CO2: 29 mmol/L (ref 22–32)
Chloride: 93 mmol/L — ABNORMAL LOW (ref 98–111)
Creatinine, Ser: 1.97 mg/dL — ABNORMAL HIGH (ref 0.44–1.00)
GFR calc Af Amer: 27 mL/min — ABNORMAL LOW (ref >60)
GFR, EST NON AFRICAN AMERICAN: 23 mL/min — AB (ref >60)
Glucose, Bld: 144 mg/dL — ABNORMAL HIGH (ref 70–99)
Potassium: 3.2 mmol/L — ABNORMAL LOW (ref 3.5–5.1)
Sodium: 135 mmol/L (ref 135–145)
Total Bilirubin: 0.6 mg/dL (ref 0.3–1.2)
Total Protein: 7.1 g/dL (ref 6.5–8.1)

## 2018-07-19 LAB — CBC WITH DIFFERENTIAL/PLATELET
Abs Immature Granulocytes: 0.87 K/uL — ABNORMAL HIGH (ref 0.00–0.07)
Basophils Absolute: 0.1 K/uL (ref 0.0–0.1)
Basophils Relative: 0 %
Eosinophils Absolute: 0 K/uL (ref 0.0–0.5)
Eosinophils Relative: 0 %
HCT: 32.7 % — ABNORMAL LOW (ref 36.0–46.0)
Hemoglobin: 9.7 g/dL — ABNORMAL LOW (ref 12.0–15.0)
Immature Granulocytes: 2 %
Lymphocytes Relative: 7 %
Lymphs Abs: 2.4 K/uL (ref 0.7–4.0)
MCH: 27.4 pg (ref 26.0–34.0)
MCHC: 29.7 g/dL — ABNORMAL LOW (ref 30.0–36.0)
MCV: 92.4 fL (ref 80.0–100.0)
Monocytes Absolute: 1.9 K/uL — ABNORMAL HIGH (ref 0.1–1.0)
Monocytes Relative: 5 %
Neutro Abs: 31.5 K/uL — ABNORMAL HIGH (ref 1.7–7.7)
Neutrophils Relative %: 86 %
Platelets: 346 K/uL (ref 150–400)
RBC: 3.54 MIL/uL — ABNORMAL LOW (ref 3.87–5.11)
RDW: 19 % — ABNORMAL HIGH (ref 11.5–15.5)
WBC: 36.8 K/uL — ABNORMAL HIGH (ref 4.0–10.5)
nRBC: 0.1 % (ref 0.0–0.2)

## 2018-07-19 LAB — URINALYSIS, ROUTINE W REFLEX MICROSCOPIC
Bilirubin Urine: NEGATIVE
Glucose, UA: NEGATIVE mg/dL
Hgb urine dipstick: NEGATIVE
Ketones, ur: NEGATIVE mg/dL
Nitrite: NEGATIVE
Protein, ur: NEGATIVE mg/dL
Specific Gravity, Urine: 1.024 (ref 1.005–1.030)
pH: 5 (ref 5.0–8.0)

## 2018-07-19 LAB — LACTIC ACID, PLASMA: Lactic Acid, Venous: 3.4 mmol/L (ref 0.5–1.9)

## 2018-07-19 LAB — CBG MONITORING, ED: Glucose-Capillary: 146 mg/dL — ABNORMAL HIGH (ref 70–99)

## 2018-07-19 LAB — BRAIN NATRIURETIC PEPTIDE: B Natriuretic Peptide: 2080.1 pg/mL — ABNORMAL HIGH (ref 0.0–100.0)

## 2018-07-19 MED ORDER — CALCIUM ACETATE (PHOS BINDER) 667 MG PO CAPS
667.0000 mg | ORAL_CAPSULE | Freq: Three times a day (TID) | ORAL | Status: DC
  Administered 2018-07-20 – 2018-07-23 (×9): 667 mg via ORAL
  Filled 2018-07-19 (×10): qty 1

## 2018-07-19 MED ORDER — POTASSIUM CHLORIDE CRYS ER 20 MEQ PO TBCR
40.0000 meq | EXTENDED_RELEASE_TABLET | Freq: Once | ORAL | Status: AC
  Administered 2018-07-19: 40 meq via ORAL
  Filled 2018-07-19: qty 2

## 2018-07-19 MED ORDER — HEPARIN SODIUM (PORCINE) 5000 UNIT/ML IJ SOLN
5000.0000 [IU] | Freq: Three times a day (TID) | INTRAMUSCULAR | Status: DC
  Administered 2018-07-20 – 2018-07-23 (×10): 5000 [IU] via SUBCUTANEOUS
  Filled 2018-07-19 (×10): qty 1

## 2018-07-19 MED ORDER — ACETAMINOPHEN 325 MG PO TABS
650.0000 mg | ORAL_TABLET | Freq: Four times a day (QID) | ORAL | Status: DC | PRN
  Administered 2018-07-24: 650 mg via ORAL
  Filled 2018-07-19: qty 2

## 2018-07-19 MED ORDER — SODIUM CHLORIDE 0.9 % IV BOLUS
500.0000 mL | Freq: Once | INTRAVENOUS | Status: AC
  Administered 2018-07-19: 500 mL via INTRAVENOUS

## 2018-07-19 MED ORDER — POLYETHYLENE GLYCOL 3350 17 G PO PACK
17.0000 g | PACK | Freq: Every day | ORAL | Status: DC
  Administered 2018-07-20 – 2018-07-22 (×3): 17 g via ORAL
  Filled 2018-07-19 (×4): qty 1

## 2018-07-19 MED ORDER — PRAVASTATIN SODIUM 40 MG PO TABS
40.0000 mg | ORAL_TABLET | Freq: Every day | ORAL | Status: DC
  Administered 2018-07-20 – 2018-07-24 (×5): 40 mg via ORAL
  Filled 2018-07-19 (×5): qty 1

## 2018-07-19 MED ORDER — ACETAMINOPHEN 650 MG RE SUPP: 650.0000 mg | Freq: Four times a day (QID) | RECTAL | Status: DC | PRN

## 2018-07-19 MED ORDER — SODIUM CHLORIDE 0.9% FLUSH
3.0000 mL | Freq: Two times a day (BID) | INTRAVENOUS | Status: DC
  Administered 2018-07-20 – 2018-07-23 (×7): 3 mL via INTRAVENOUS

## 2018-07-19 MED ORDER — PANTOPRAZOLE SODIUM 20 MG PO TBEC
20.0000 mg | DELAYED_RELEASE_TABLET | Freq: Every day | ORAL | Status: DC
  Administered 2018-07-20 – 2018-07-24 (×5): 20 mg via ORAL
  Filled 2018-07-19 (×5): qty 1

## 2018-07-19 MED ORDER — SODIUM CHLORIDE 0.9 % IV SOLN
1.0000 g | INTRAVENOUS | Status: DC
  Administered 2018-07-20 – 2018-07-22 (×3): 1 g via INTRAVENOUS
  Filled 2018-07-19 (×3): qty 1

## 2018-07-19 MED ORDER — POTASSIUM CHLORIDE CRYS ER 20 MEQ PO TBCR
40.0000 meq | EXTENDED_RELEASE_TABLET | Freq: Once | ORAL | Status: AC
  Administered 2018-07-20: 40 meq via ORAL
  Filled 2018-07-19: qty 2

## 2018-07-19 MED ORDER — SENNOSIDES-DOCUSATE SODIUM 8.6-50 MG PO TABS: 2.0000 | ORAL_TABLET | Freq: Every evening | ORAL | Status: DC | PRN

## 2018-07-19 MED ORDER — RENA-VITE PO TABS: 1.0000 | ORAL_TABLET | Freq: Every day | ORAL | Status: DC

## 2018-07-19 MED ORDER — METRONIDAZOLE IN NACL 5-0.79 MG/ML-% IV SOLN
500.0000 mg | Freq: Once | INTRAVENOUS | Status: AC
  Administered 2018-07-19: 500 mg via INTRAVENOUS
  Filled 2018-07-19: qty 100

## 2018-07-19 MED ORDER — SODIUM CHLORIDE 0.9 % IV SOLN: 2.0000 g | Freq: Once | INTRAVENOUS | Status: DC

## 2018-07-19 MED ORDER — VANCOMYCIN HCL IN DEXTROSE 750-5 MG/150ML-% IV SOLN
750.0000 mg | INTRAVENOUS | Status: DC
  Administered 2018-07-21: 750 mg via INTRAVENOUS
  Filled 2018-07-19: qty 150

## 2018-07-19 MED ORDER — GLUCERNA SHAKE PO LIQD: 237.0000 mL | Freq: Every day | ORAL | Status: DC

## 2018-07-19 MED ORDER — PRO-STAT SUGAR FREE PO LIQD: 30.0000 mL | Freq: Three times a day (TID) | ORAL | Status: DC

## 2018-07-19 MED ORDER — ACETAMINOPHEN 325 MG PO TABS
650.0000 mg | ORAL_TABLET | Freq: Once | ORAL | Status: AC
  Administered 2018-07-19: 650 mg via ORAL
  Filled 2018-07-19: qty 2

## 2018-07-19 MED ORDER — VANCOMYCIN HCL IN DEXTROSE 1-5 GM/200ML-% IV SOLN: 1000.0000 mg | Freq: Once | INTRAVENOUS | Status: DC

## 2018-07-19 MED ORDER — SODIUM CHLORIDE 0.9 % IV SOLN
1.0000 g | Freq: Once | INTRAVENOUS | Status: AC
  Administered 2018-07-19: 1 g via INTRAVENOUS
  Filled 2018-07-19: qty 1

## 2018-07-19 MED ORDER — SODIUM CHLORIDE 0.9% FLUSH: 3.0000 mL | INTRAVENOUS | Status: DC | PRN

## 2018-07-19 MED ORDER — DONEPEZIL HCL 5 MG PO TABS
5.0000 mg | ORAL_TABLET | Freq: Every day | ORAL | Status: DC
  Administered 2018-07-20 – 2018-07-23 (×5): 5 mg via ORAL
  Filled 2018-07-19 (×6): qty 1

## 2018-07-19 MED ORDER — VANCOMYCIN HCL 10 G IV SOLR
1250.0000 mg | Freq: Once | INTRAVENOUS | Status: AC
  Administered 2018-07-19: 1250 mg via INTRAVENOUS
  Filled 2018-07-19: qty 1250

## 2018-07-19 NOTE — ED Triage Notes (Signed)
Pt arrives with her daughters. Pt lives at home. Daughters state that the pt has been confused, off balance and not acting herself since last night. They called the doctor and he is concerned for infection. She had AKA completed about 3 weeks ago. Unknown fevers. Pt had dialysis today. Has not had her night time medications tonight.   Pt daughter is available for questions.  1st contact Briyanna Billingham @ [phone] 2nd contact Runell Gess @ [phone]

## 2018-07-19 NOTE — Progress Notes (Signed)
Pharmacy Antibiotic Note  Glenda [name] is a 85 y.o. female admitted on 07/19/2018 with sepsis.  Pharmacy has been consulted for vancomycin and cefepime dosing. Patient with ESRD, last HD session 07/19/18. On admission patient's Tmax 100.6 with elevated WBC of 36.8.   Plan: Cefepime 1g IV q24h Vancomycin 1250 mg IV x 1, then 750 mg IV qHD Monitor HD schedule, CBC, and clinical progress F/u C&S, de-escalation plans, and LOT  Weight: 143 lb 1.3 oz (64.9 kg)  Temp (24hrs), Avg:100.6 F (38.1 C), Min:100.6 F (38.1 C), Max:100.6 F (38.1 C)  Recent Labs  Lab 07/19/18 1940  WBC 36.8*    Estimated Creatinine Clearance: 9.7 mL/min (A) (by C-G formula based on SCr of 4.17 mg/dL (H)).    No Known Allergies  Antimicrobials this admission: Vancomcyin 3/25 >>  Cefepime 3/25 >>  Flagyl 3/25 >>   Dose adjustments this admission: N/A  Microbiology results: 3/25 BCx:  3/25 UCx:    Thank you for allowing pharmacy to be a part of this patient's care.  Leron Croak, PharmD PGY1 Pharmacy Resident Phone: [phone]  Please check AMION for all [place] phone numbers 07/19/2018 8:39 PM

## 2018-07-19 NOTE — ED Notes (Signed)
The pts daughter is at   The bedside  Pt going back to xray and c-t

## 2018-07-19 NOTE — ED Notes (Signed)
Iv team at bedside  

## 2018-07-19 NOTE — ED Notes (Signed)
Report given to rn on 31m

## 2018-07-19 NOTE — ED Provider Notes (Signed)
[name] EMERG[facility]Y DE[facility]MENT Provider Note   CSN: [id number] Arrival date & time: 07/19/18  1913    History   Chief Complaint Chief Complaint  Patient presents with   Altered Mental Status    HPI Glenda [name] is a 85 y.o. female with a past medical history of CAD, CHF, ESRD on dialysis MWF, DM, s/p L AKA for poor wound healing after earlier BKA discharged on 07/04/2018 who presents to ED for altered mental status.  Majority of history is provided by daughter on the phone.  States that when she woke up this morning, patient seemed like she was "disoriented, not even able to hold her sandwich, felt like the right side of her body was swollen."  He states that at baseline, patient is able to hold a conversation and is alert and oriented x4.  States that patient had repetitive questioning today.  She called her doctor who advised her to come to the ED for concerns of infection.  Patient herself denies any complaints.  Daughter states that the only pain she has been complaining of today is her entire right leg.  She has wounds in her right foot which are being evaluated by her PCP.     HPI  Past Medical History:  Diagnosis Date   Arthritis    patient denies   Breast cancer ([place])    Breast Cancer right   treated with radiation   CAD (coronary artery disease)    Chest pain    CHF (congestive heart failure) (HCC)    Constipation    Dizziness    DM (diabetes mellitus) ([place])    TYpe II   ESRD (end stage renal disease) ([place])    TTHSAT- [address]   GERD (gastroesophageal reflux disease)    HLD (hyperlipidemia)    HTN (hypertension)    Obesity    PAF (paroxysmal atrial fibrillation) ([place])    Radiation 09/11/15-10/13/15   42.72 Gy to right breast, 12 Gy boost to right breast   Shortness of breath dyspnea    04/27/2018- not so much since she began dialysis   Sleep apnea    no longer uses cpap    Stroke [facility])    balance issues - walks with  walker and uses wheelchair  Had 3 in 2017- last 1 was in October. 3/5/2020uses wheel chair    UTI (urinary tract infection)     Patient Active Problem List   Diagnosis Date Noted   Pressure injury of skin 07/01/2018   BKA stump complication ([place]) 50/27/7412   PAD (peripheral artery disease) ([place]) 04/28/2018   PVD (peripheral vascular disease) ([place]) 04/21/2018   DM (diabetes mellitus), type 2, uncontrolled, with renal complications ([place]) 87/86/7672   PAF (paroxysmal atrial fibrillation) ([place]) 09/47/0962   Acute diastolic (congestive) heart failure ([place]) 08/20/2017   Acute renal failure superimposed on chronic kidney disease ([place])    Acute respiratory failure with hypoxia (HCC)    Hypokalemia    Chronic kidney disease (CKD), stage IV (severe) ([place]) 01/03/2017   CHF (congestive heart failure) ([place]) 01/02/2017   Blood pressure alteration 09/16/2016   Somnolence, daytime 12/08/2015   Cerebral thrombosis with cerebral infarction 09/07/2015   Lacunar infarct, acute (HCC)    Nausea & vomiting 09/05/2015   Type II diabetes mellitus with neurological manifestations, uncontrolled ([place]) 09/03/2015   Bilateral lower extremity edema 09/03/2015   Essential hypertension    Diabetes mellitus type 2 in nonobese [facility])    History of breast cancer  AKI (acute kidney injury) ([place])    Acute blood loss anemia    Hemiparesis (HCC)    Gait disturbance, post-stroke    Dysphagia, post-stroke    Acute ischemic stroke/right cerebellar 08/24/2015   Breast cancer of upper-outer quadrant of right female breast ([place]) 04/30/2015   CVA (cerebral infarction) 07/05/2014   Hyperlipidemia 07/05/2014   Mild mitral regurgitation by prior echocardiogram 12/20/2011   Acute on chronic diastolic CHF (congestive heart failure) ([place]) 11/25/2010   Diabetes mellitus type 1, uncontrolled, insulin dependent ([place]) 02/26/2009   OBESITY 02/26/2009   Uncontrolled hypertension 02/26/2009    CAD (coronary artery disease) 02/26/2009   GASTROESOPHAGEAL REFLUX DISEASE 02/26/2009    Past Surgical History:  Procedure Laterality Date   ABDOMINAL AORTOGRAM W/LOWER EXTREMITY N/A 04/27/2018   Procedure: ABDOMINAL AORTOGRAM W/LOWER EXTREMITY;  Surgeon: [name], MD;  Location: [place] CV LAB;  Service: Cardiovascular;  Laterality: N/A;   AMPUTATION Left 04/28/2018   Procedure: AMPUTATION RAY LEFT;  Surgeon: [name], MD;  Location: [place];  Service: Vascular;  Laterality: Left;   AMPUTATION Left 05/26/2018   Procedure: AMPUTATION BELOW KNEE, LEFT;  Surgeon: [name], MD;  Location: [place];  Service: Vascular;  Laterality: Left;   AMPUTATION Left 06/30/2018   Procedure: AMPUTATION ABOVE KNEE;  Surgeon: [name], MD;  Location: [place];  Service: Vascular;  Laterality: Left;   AV FISTULA PLACEMENT Left 07/14/2017   Procedure: Left arm Brachicephalic Fistula Creation;  Surgeon: Serafina Mitchell, MD;  Location: [place];  Service: Vascular;  Laterality: Left;   BALLOON DILATION N/A 08/18/2017   Procedure: [place];  Surgeon: Clarene Essex, MD;  Location: [place];  Service: Endoscopy;  Laterality: N/A;   BASCILIC VEIN TRANSPOSITION Left 10/12/2017   Procedure: SECOND STAGE BASILIC VEIN TRANSPOSITION LEFT ARM;  Surgeon: Serafina Mitchell, MD;  Location: [facility] OR;  Service: Vascular;  Laterality: Left;   CHOLECYSTECTOMY     colon polyps; hx     CORONARY ANGIOPLASTY  02/14/09   ESOPHAGOGASTRODUODENOSCOPY (EGD) WITH PROPOFOL Left 09/08/2015   Procedure: ESOPHAGOGASTRODUODENOSCOPY (EGD) WITH PROPOFOL;  Surgeon: Clarene Essex, MD;  Location: [facility] ENDOSCOPY;  Service: Endoscopy;  Laterality: Left;   ESOPHAGOGASTRODUODENOSCOPY (EGD) WITH PROPOFOL N/A 08/18/2017   Procedure: ESOPHAGOGASTRODUODENOSCOPY (EGD) WITH PROPOFOL;  Surgeon: Clarene Essex, MD;  Location: [place];  Service: Endoscopy;  Laterality: N/A;  with flouroscopy   IR FLUORO GUIDE CV  LINE RIGHT  08/25/2017   IR FLUORO GUIDE CV LINE RIGHT  08/29/2017   IR US GUIDE VASC ACCESS RIGHT  08/25/2017   LOWER EXTREMITY ANGIOGRAPHY Right 05/24/2018   Procedure: Lower Extremity Angiography;  Surgeon: [name], MD;  Location: [place] CV LAB;  Service: Cardiovascular;  Laterality: Right;   PERIPHERAL VASCULAR ATHERECTOMY Right 05/24/2018   Procedure: PERIPHERAL VASCULAR ATHERECTOMY;  Surgeon: [name], MD;  Location: [place] CV LAB;  Service: Cardiovascular;  Laterality: Right;  Anterial Tibial   RADIOACTIVE SEED GUIDED PARTIAL MASTECTOMY WITH AXILLARY SENTINEL LYMPH NODE BIOPSY Right 07/17/2015   Procedure: RADIOACTIVE SEED GUIDED PARTIAL MASTECTOMY WITH AXILLARY SENTINEL LYMPH NODE BIOPSY;  Surgeon: Erroll Luna, MD;  Location: [place];  Service: General;  Laterality: Right;     OB History   No obstetric history on file.      Home Medications    Prior to Admission medications   Medication Sig Start Date End Date Taking? Authorizing Provider  acetaminophen (TYLENOL) 500 MG tablet Take 1,000 mg by mouth at  bedtime as needed for headache (pain).    Yes [provider]  aspirin 81 MG EC tablet Take 1 tablet (81 mg total) by mouth every evening. Patient taking differently: Take 81 mg by mouth daily.  05/30/18  Yes Dagoberto Ligas, PA-C  calcium acetate (PHOSLO) 667 MG capsule Take 667 mg by mouth 3 (three) times daily with meals.   Yes [provider]  citalopram (CELEXA) 20 MG tablet Take 20 mg by mouth daily.    Yes [provider]  donepezil (ARICEPT) 5 MG tablet Take 5 mg by mouth at bedtime.   Yes [provider]  famotidine (PEPCID AC) 10 MG chewable tablet Chew 10 mg by mouth daily as needed for heartburn.   Yes [provider]  HYDROmorphone (DILAUDID) 2 MG tablet Take 1 tablet (2 mg total) by mouth every 4 (four) hours as needed for moderate pain or severe pain. 07/04/18  Yes Velna Ochs, MD    lidocaine-prilocaine (EMLA) cream Apply 1 application topically See admin instructions. Apply topically prior to dialysis on Monday, Wednesday, Friday   Yes [provider]  multivitamin (RENA-VIT) TABS tablet Take 1 tablet by mouth daily.   Yes [provider]  pantoprazole (PROTONIX) 20 MG tablet Take 20 mg by mouth daily.    Yes [provider]  polyethylene glycol (MIRALAX / GLYCOLAX) packet Take 17 g by mouth daily. Mix in beverage of choice and drink   Yes [provider]  pravastatin (PRAVACHOL) 40 MG tablet Take 40 mg by mouth daily.    Yes [provider]  senna-docusate (SENNA S) 8.6-50 MG tablet Take 2 tablets by mouth at bedtime as needed (to maintain comfortable bowel patterns).   Yes [provider]  Amino Acids-Protein Hydrolys (FEEDING SUPPLEMENT, PRO-STAT SUGAR FREE 64,) LIQD Take 30 mLs by mouth 3 (three) times daily with meals.    [provider]  clopidogrel (PLAVIX) 75 MG tablet Take 1 tablet (75 mg total) by mouth daily. Patient not taking: Reported on 07/19/2018 05/30/18   Dagoberto Ligas, PA-C  feeding supplement, GLUCERNA SHAKE, (GLUCERNA SHAKE) LIQD Take 237 mLs by mouth daily.    [provider]    Family History Family History  Problem Relation Age of Onset   Diabetes Mother    Colon cancer Father        also had lung   Diabetes Other    Heart attack Other    Diabetes Sister        TWIN   Breast cancer Sister     Social History Social History   Tobacco Use   Smoking status: Never Smoker   Smokeless tobacco: Never Used  Substance Use Topics   Alcohol use: No    Alcohol/week: 0.0 standard drinks   Drug use: No     Allergies   Patient has no known allergies.   Review of Systems Review of Systems  Unable to perform ROS: Mental status change     Physical Exam Updated Vital Signs BP (!) 141/51 (BP Location: Right Arm)    Pulse 78    Temp (!) 100.6 F (38.1 C)  (Rectal)    Resp 17    Wt 64.9 kg    SpO2 98%    BMI 23.09 kg/m   Physical Exam Vitals signs and nursing note reviewed.  Constitutional:      General: She is not in acute distress.    Appearance: She is well-developed.  HENT:     Head:  Normocephalic and atraumatic.     Nose: Nose normal.  Eyes:     General: No scleral icterus.       Right eye: No discharge.        Left eye: No discharge.     Conjunctiva/sclera: Conjunctivae normal.     Pupils: Pupils are equal, round, and reactive to light.  Neck:     Musculoskeletal: Normal range of motion and neck supple. No neck rigidity.     Comments: No meningismus. Cardiovascular:     Rate and Rhythm: Normal rate and regular rhythm.     Heart sounds: Normal heart sounds. No murmur. No friction rub. No gallop.   Pulmonary:     Effort: Pulmonary effort is normal. No respiratory distress.     Breath sounds: Normal breath sounds.  Abdominal:     General: Bowel sounds are normal. There is no distension.     Palpations: Abdomen is soft.     Tenderness: There is no abdominal tenderness. There is no guarding.  Musculoskeletal: Normal range of motion.     Right lower leg: Edema (2+ pitting) present.  Skin:    General: Skin is warm and dry.     Findings: No rash.     Comments: L AKA stump without erythema, warmth, dehiscence or streaking noted.  Staples intact. R foot wounds noted in image. 2+DP pulse palpated.  Neurological:     Mental Status: She is alert. She is disoriented.     Cranial Nerves: No cranial nerve deficit.     Motor: No abnormal muscle tone.     Coordination: Coordination normal.     Comments: Alert, oriented to self. Able to name daughters. Disoriented to place, time or situation. Strength 4/5 in BUE. No facial asymmetry noted.          ED Treatments / Results  Labs (all labs ordered are listed, but only abnormal results are displayed) Labs Reviewed  COMPREHENSIVE METABOLIC PANEL - Abnormal; Notable for the  following components:      Result Value   Potassium 3.2 (*)    Chloride 93 (*)    Glucose, Bld 144 (*)    Creatinine, Ser 1.97 (*)    Calcium 8.1 (*)    Albumin 1.8 (*)    Alkaline Phosphatase 276 (*)    GFR calc non Af Amer 23 (*)    GFR calc Af Amer 27 (*)    All other components within normal limits  CBC WITH DIFFERENTIAL/PLATELET - Abnormal; Notable for the following components:   WBC 36.8 (*)    RBC 3.54 (*)    Hemoglobin 9.7 (*)    HCT 32.7 (*)    MCHC 29.7 (*)    RDW 19.0 (*)    Neutro Abs 31.5 (*)    Monocytes Absolute 1.9 (*)    Abs Immature Granulocytes 0.87 (*)    All other components within normal limits  LACTIC ACID, PLASMA - Abnormal; Notable for the following components:   Lactic Acid, Venous 3.4 (*)    All other components within normal limits  URINALYSIS, ROUTINE W REFLEX MICROSCOPIC - Abnormal; Notable for the following components:   Color, Urine AMBER (*)    Leukocytes,Ua TRACE (*)    Bacteria, UA RARE (*)    All other components within normal limits  BRAIN NATRIURETIC PEPTIDE - Abnormal; Notable for the following components:   B Natriuretic Peptide 2,080.1 (*)    All other components within normal limits  CBG MONITORING, ED -  Abnormal; Notable for the following components:   Glucose-Capillary 146 (*)    All other components within normal limits  CULTURE, BLOOD (ROUTINE X 2)  CULTURE, BLOOD (ROUTINE X 2)  URINE CULTURE  LACTIC ACID, PLASMA    EKG EKG Interpretation  Date/Time:  Wednesday July 19 2018 19:27:53 EDT Ventricular Rate:  77 PR Interval:    QRS Duration: 92 QT Interval:  503 QTC Calculation: 570 R Axis:   42 Text Interpretation:  Sinus rhythm Ventricular premature complex Short PR interval Nonspecific T abnormalities, lateral leads Prolonged QT interval Baseline wander in lead(s) V6 Partial missing lead(s): V6 Atrial fibrillation RESOLVED SINCE PREVIOUS Confirmed by Blanchie Dessert [phone]) on 07/19/2018 8:27:38 PM   Radiology Dg  Chest 2 View  Result Date: 07/19/2018 CLINICAL DATA:  85 year old female fever and altered mental status. EXAM: CHEST - 2 VIEW COMPARISON:  01/06/2018 and earlier. FINDINGS: Chronic elevation of the right hemidiaphragm is stable. Mediastinal contours remain within normal limits. Visualized tracheal air column is within normal limits. Mild chronic increased interstitial markings are stable. No pneumothorax, pulmonary edema, pleural effusion or acute pulmonary opacity. Negative visible bowel gas pattern. No acute osseous abnormality identified. Questionable aneurysmal enlargement of abdominal aortic calcifications in the abdomen on the lateral view. IMPRESSION: 1. No acute cardiopulmonary abnormality. 2. Questionable abdominal aortic aneurysm judging from vascular calcifications on the lateral view. Consider Aorta Ultrasound to evaluate further. Electronically Signed   By: Genevie Ann M.D.   On: 07/19/2018 20:55   Ct Head Wo Contrast  Result Date: 07/19/2018 CLINICAL DATA:  Altered mental status. EXAM: CT HEAD WITHOUT CONTRAST TECHNIQUE: Contiguous axial images were obtained from the base of the skull through the vertex without intravenous contrast. COMPARISON:  Head CT 06/13/2016 FINDINGS: Brain: No intracranial hemorrhage, mass effect, or midline shift. Unchanged degree of atrophy and chronic small vessel ischemia. Remote lacunar infarct in the right basal ganglia and thalamus5. Remote infarct in the left pons. Small remote right cerebellar infarct. No hydrocephalus. The basilar cisterns are patent. No evidence of territorial infarct or acute ischemia. No extra-axial or intracranial fluid collection. Vascular: Atherosclerosis of skullbase vasculature without hyperdense vessel or abnormal calcification. Skull: No fracture or focal lesion. Sinuses/Orbits: Mild motion artifact. No acute findings. Other: None. IMPRESSION: 1. No acute intracranial abnormality. 2. Unchanged atrophy, chronic small vessel ischemia and  remote lacunar infarcts. Electronically Signed   By: Keith Rake M.D.   On: 07/19/2018 20:44    Procedures Procedures (including critical care time)  CRITICAL CARE Performed by: Delia Heady   Total critical care time: 35 minutes  Critical care time was exclusive of separately billable procedures and treating other patients.  Critical care was necessary to treat or prevent imminent or life-threatening deterioration.  Critical care was time spent personally by me on the following activities: development of treatment plan with patient and/or surrogate as well as nursing, discussions with consultants, evaluation of patient's response to treatment, examination of patient, obtaining history from patient or surrogate, ordering and performing treatments and interventions, ordering and review of laboratory studies, ordering and review of radiographic studies, pulse oximetry and re-evaluation of patient's condition.   Medications Ordered in ED Medications  metroNIDAZOLE (FLAGYL) IVPB 500 mg (has no administration in time range)  vancomycin (VANCOCIN) 1,250 mg in sodium chloride 0.9 % 250 mL IVPB (1,250 mg Intravenous New Bag/Given 07/19/18 2153)  vancomycin (VANCOCIN) IVPB 750 mg/150 ml premix (has no administration in time range)  ceFEPIme (MAXIPIME) 1 g in sodium chloride 0.9 %  100 mL IVPB (has no administration in time range)  potassium chloride SA (K-DUR,KLOR-CON) CR tablet 40 mEq (has no administration in time range)  ceFEPIme (MAXIPIME) 1 g in sodium chloride 0.9 % 100 mL IVPB (0 g Intravenous Stopped 07/19/18 2152)  acetaminophen (TYLENOL) tablet 650 mg (650 mg Oral Given 07/19/18 2125)  sodium chloride 0.9 % bolus 500 mL (0 mLs Intravenous Stopped 07/19/18 2152)     Initial Impression / Assessment and Plan / ED Course  I have reviewed the triage vital signs and the nursing notes.  Pertinent labs & imaging results that were available during my care of the patient were reviewed by me  and considered in my medical decision making (see chart for details).        85yo F s/p R AKA discharged on 07/04/18, ESRD on dialysis MWF, CAD, CHF, diabetes presents to ED for altered mental status.  Daughter at bedside states that she has been altered since she woke up this morning, repetitive questioning, swollen on her right side for the past several days.  States that usually she is able to hold a conversation and is alert and oriented x4.  Patient not anticoagulated after AKA done earlier in the month.  Patient febrile to 100.6 here, other vital signs within normal limits.  Right lower extremity edema noted.  Latter scan revealed 200ccs, she was cathed.  Lab work significant for leukocytosis of 36, lactic acid of 3.4, potassium of 3.2.  CXR unremarkable, CT negative for acute abnormality.  Right toe wound appears chronic based on images  From the past several days that daughter was able to show me. Unfortunately ultrasound is not available at this time to rule out a DVT in her right lower extremity.  Patient given broad-spectrum antibiotics due to unknown source of sepsis, antipyretics and fluids.  Will admit to IMTS for further evaluation and defer anticoagulation to admitting team.  Final Clinical Impressions(s) / ED Diagnoses   Final diagnoses:  Sepsis, due to unspecified organism, unspecified whether acute organ dysfunction present [facility])  Hypokalemia    ED Discharge Orders    None       Portions of this note were generated with Dragon dictation software. Dictation errors may occur despite best attempts at proofreading.    Delia Heady, PA-C 07/19/18 2230    Blanchie Dessert, MD 07/22/18 1040

## 2018-07-19 NOTE — ED Notes (Signed)
IV nurse at bedside.

## 2018-07-19 NOTE — ED Triage Notes (Signed)
The pt arrived to treatment room   She is confused  The daughter was not allowed to say with the pt  She knows the day of thwe week but doesn not know the month the year or the president  She has a ak amp on the lt from 3 weeks ago  And she has a bandage on her rt foot  No complaints of pain

## 2018-07-19 NOTE — ED Notes (Signed)
I will collect lactic acid, plasma later.

## 2018-07-19 NOTE — H&P (Addendum)
Date: 07/19/2018               Patient Name:  Glenda [name] MRN: [id number]  DOB: 20-Jan-1934 Age / Sex: 85 y.o., female   PCP: Angelica Pou, MD         Medical Service: Internal Medicine Teaching Service         Attending Physician: Dr. Rebeca Alert Raynaldo Opitz, MD    First Contact: Dr. Harlow Ohms Pager: [phone]  Second Contact: Dr. Velna Ochs Pager: [phone]       After Hours (After 5p/  First Contact Pager: [phone]  weekends / holidays): Second Contact Pager: [phone]   Chief Complaint: Confusion  History of Present Illness: [name] is an 85 year old female with end-stage renal disease, complicated peripheral artery disease with left AKA completed 3 weeks ago, diabetes, chronic diastolic CHF, and hypertension who presents with confusion.  [name] does appear confused during our conversation.  Much of the history is given by her daughter Glenda [name].  She was recently admitted from 3/6 to 3/10 and underwent a successful left AKA.  She was discharged home with regular home health PT assistance.  Per her daughter, [name] was doing well up until this morning when she woke up.  She states that she seemed very confused.  He is normally able to hold a conversation and is alert and oriented x4.  This morning she did not know where she was at or who her family members were.  She repeatedly asked the same questions.  Her daughter called [facility] who she normally follows with and they recommended that she come to the ED.  Only complaint over the last week has been right lower extremity pain.  She denies fevers at home, shortness of breath, cough, chest pain, domino pain, changes in bowel movements and urinary symptoms.  In the ED, she was found to be febrile with a temperature of 100.6F, mild hypertension with SBP from 120s to 150s, and otherwise normal vital signs.  Pertinent labs include elevated BNP of 2080, elevated lactic acid of 3.4, hypokalemia of 3.2, leukocytosis of 36.8  with neutrophil predominance, normocytic anemia with hemoglobin 9.7 (appears to be at baseline).  Urinalysis unremarkable.  Chest x-ray unremarkable, CT negative for acute abnormalities.  Code sepsis was called and she was given vancomycin, cefepime, metronidazole, IV fluids.  Meds:  Current Meds  Medication Sig  . acetaminophen (TYLENOL) 500 MG tablet Take 1,000 mg by mouth at bedtime as needed for headache (pain).   Marland Kitchen aspirin 81 MG EC tablet Take 1 tablet (81 mg total) by mouth every evening. (Patient taking differently: Take 81 mg by mouth daily. )  . calcium acetate (PHOSLO) 667 MG capsule Take 667 mg by mouth 3 (three) times daily with meals.  . citalopram (CELEXA) 20 MG tablet Take 20 mg by mouth daily.   Marland Kitchen donepezil (ARICEPT) 5 MG tablet Take 5 mg by mouth at bedtime.  . famotidine (PEPCID AC) 10 MG chewable tablet Chew 10 mg by mouth daily as needed for heartburn.  Marland Kitchen HYDROmorphone (DILAUDID) 2 MG tablet Take 1 tablet (2 mg total) by mouth every 4 (four) hours as needed for moderate pain or severe pain.  Marland Kitchen lidocaine-prilocaine (EMLA) cream Apply 1 application topically See admin instructions. Apply topically prior to dialysis on Monday, Wednesday, Friday  . multivitamin (RENA-VIT) TABS tablet Take 1 tablet by mouth daily.  . pantoprazole (PROTONIX) 20 MG tablet Take 20 mg by mouth daily.   Marland Kitchen  polyethylene glycol (MIRALAX / GLYCOLAX) packet Take 17 g by mouth daily. Mix in beverage of choice and drink  . pravastatin (PRAVACHOL) 40 MG tablet Take 40 mg by mouth daily.   Marland Kitchen senna-docusate (SENNA S) 8.6-50 MG tablet Take 2 tablets by mouth at bedtime as needed (to maintain comfortable bowel patterns).     Allergies: Allergies as of 07/19/2018  . (No Known Allergies)   Past Medical History:  Diagnosis Date  . Arthritis    patient denies  . Breast cancer ([place])    Breast Cancer right   treated with radiation  . CAD (coronary artery disease)   . Chest pain   . CHF (congestive heart  failure) ([place])   . Constipation   . Dizziness   . DM (diabetes mellitus) ([place])    TYpe II  . ESRD (end stage renal disease) ([place])    TTHSAT- [address]  . GERD (gastroesophageal reflux disease)   . HLD (hyperlipidemia)   . HTN (hypertension)   . Obesity   . PAF (paroxysmal atrial fibrillation) ([place])   . Radiation 09/11/15-10/13/15   42.72 Gy to right breast, 12 Gy boost to right breast  . Shortness of breath dyspnea    04/27/2018- not so much since she began dialysis  . Sleep apnea    no longer uses cpap   . Stroke [facility])    balance issues - walks with walker and uses wheelchair  Had 3 in 2017- last 1 was in October. 3/5/2020uses wheel chair   . UTI (urinary tract infection)     Family History: Mother and twin sister have diabetes.  Twin sister has breast cancer.  Father had lung and colon cancer.  Social History: She currently lives in [place].  She has multiple children who take turns living with her and helping her with her ADLs.  She normally gets around in a wheelchair.  Per her daughter she has never used tobacco products, alcohol, or illicit drugs.  She is a current [facility] patient.  Review of Systems: A complete ROS was negative except as per HPI.   Physical Exam: Blood pressure (!) 141/51, [name] 78, temperature (!) 100.6 F (38.1 C), temperature source Rectal, resp. rate 17, weight 64.9 kg, SpO2 98 %. General: Lying in bed in no acute distress HEENT: Normocephalic and atraumatic. Cardiovascular: Normal rate, regular rhythm Respiratory: Auscultation bilaterally, no respiratory distress, normal oxygen saturations on room air Abdominal: Soft, nontender, no masses appreciated Skin: Warm and dry. MSK: Left lower extremity- s/p AKA.  Surgical site does appear to be clean, dry, intact with multiple staples in place.  Right lower extremity- 2+ pitting edema up to the mid calf.  Distal pulses intact.  Right foot wounds noted in image below.    Neurological: She is  oriented.  She is able to tell me her name, birthdate, and the current month.  She is unable to state the year or why she is in the hospital (upon questioning she tells me about her lower extremity wounds and what was done by the surgeon).  She is able to tell me that her daughter Glenda [name] is in the room.  EKG: personally reviewed my interpretation is normal sinus rhythm with prolonged QT of 507.  No signs of acute ischemia.  CXR: personally reviewed my interpretation is unremarkable with normal lung fields, normal heart size, and no signs of pleural effusion or infiltrates.  There is a questionable abdominal aortic aneurysm noted by radiology.  Head CT: IMPRESSION:  1. No acute intracranial abnormality. 2. Unchanged atrophy, chronic small vessel ischemia and remote lacunar infarcts.  Assessment & Plan by Problem: Active Problems:   Sepsis ([place])  Ms. Bryand is an 85 year old female with ESRD, complicated peripheral artery disease s/p left AKA, diabetes, chronic diastolic CHF, and hypertension who presents with confusion signs of sepsis (fever, elevated lactic acid, and leukocytosis).  She also has signs of fluid overload and elevated BNP consistent with acute on chronic diastolic heart failure.  Sepsis: Unclear source at this point.  Differential diagnosis bacteremia 2/2 HD site (AVF doesn't have signs of infection. Less likely etiologies include soft tissue infection-- she has a chronic right lower extremity wound.  This however has improved with revascularization per her daughter.  Left lower extremity surgical site shows no signs of infection.  Pulmonary and urinary sources should be considered but are less likely given unremarkable chest x-ray and UA. - S/p Vanco, cefepime, and metronidazole.  We will continue broad-spectrum antibiotics with vancomycin 150 mg every Monday-Wednesday-Friday with HD and cefepime 1 g every 24 hours. - S/p 500 mils IV fluids.  Will hold off on additional fluids given  acute on chronic diastolic heart failure exacerbation. - Blood cultures and urine cultures pending - Trend lactic acid - Tylenol PRN fever and pain  Acute on chronic diastolic heart failure: She has an elevated BNP at 2080 as well as increasing lower extremity edema.  Her weight however is close to baseline at 60.2 kg (she is usually between 62 to 66 kg) and she does not have any signs of fluid overload on chest x-ray. - Continuous cardiac monitoring - Will hold off on diuretics in the setting of active infection.  Hypokalemia: - S/p potassium 40 mEq  Questionable abdominal aortic aneurysm: Will defer further evaluation with aortic ultrasound as an outpatient.  Hypertension: Per chart review, she is not on any antihypertensives.  She has had elevated SBP's up to 150s while in the hospital.  Will hold off on adding any new medications in the setting of active infection.  Type 2 diabetes: Diet controlled.  CBGs within normal limits.  Continue to monitor.  FEN/GI: Soft diet CODE STATUS: Full DVT prophylaxis: Subcu heparin  Dispo: Admit patient to Inpatient with expected length of stay greater than 2 midnights.  Signed: Carroll Sage, MD 07/19/2018, 10:23 PM

## 2018-07-20 ENCOUNTER — Other Ambulatory Visit (HOSPITAL_COMMUNITY): Payer: Medicare (Managed Care)

## 2018-07-20 ENCOUNTER — Inpatient Hospital Stay (HOSPITAL_COMMUNITY): Payer: Medicare (Managed Care)

## 2018-07-20 DIAGNOSIS — E1022 Type 1 diabetes mellitus with diabetic chronic kidney disease: Secondary | ICD-10-CM

## 2018-07-20 DIAGNOSIS — Z992 Dependence on renal dialysis: Secondary | ICD-10-CM

## 2018-07-20 DIAGNOSIS — I132 Hypertensive heart and chronic kidney disease with heart failure and with stage 5 chronic kidney disease, or end stage renal disease: Secondary | ICD-10-CM

## 2018-07-20 DIAGNOSIS — E876 Hypokalemia: Secondary | ICD-10-CM

## 2018-07-20 DIAGNOSIS — R509 Fever, unspecified: Secondary | ICD-10-CM | POA: Diagnosis present

## 2018-07-20 DIAGNOSIS — M7989 Other specified soft tissue disorders: Secondary | ICD-10-CM

## 2018-07-20 DIAGNOSIS — A419 Sepsis, unspecified organism: Principal | ICD-10-CM

## 2018-07-20 DIAGNOSIS — Z89612 Acquired absence of left leg above knee: Secondary | ICD-10-CM

## 2018-07-20 DIAGNOSIS — N186 End stage renal disease: Secondary | ICD-10-CM

## 2018-07-20 DIAGNOSIS — D72829 Elevated white blood cell count, unspecified: Secondary | ICD-10-CM | POA: Diagnosis present

## 2018-07-20 DIAGNOSIS — I5033 Acute on chronic diastolic (congestive) heart failure: Secondary | ICD-10-CM

## 2018-07-20 DIAGNOSIS — E1051 Type 1 diabetes mellitus with diabetic peripheral angiopathy without gangrene: Secondary | ICD-10-CM

## 2018-07-20 DIAGNOSIS — F0390 Unspecified dementia without behavioral disturbance: Secondary | ICD-10-CM

## 2018-07-20 LAB — BASIC METABOLIC PANEL
Anion gap: 8 (ref 5–15)
BUN: 15 mg/dL (ref 8–23)
CALCIUM: 7.9 mg/dL — AB (ref 8.9–10.3)
CO2: 28 mmol/L (ref 22–32)
Chloride: 100 mmol/L (ref 98–111)
Creatinine, Ser: 2.47 mg/dL — ABNORMAL HIGH (ref 0.44–1.00)
GFR calc Af Amer: 20 mL/min — ABNORMAL LOW (ref >60)
GFR calc non Af Amer: 18 mL/min — ABNORMAL LOW (ref >60)
GLUCOSE: 123 mg/dL — AB (ref 70–99)
Potassium: 3.2 mmol/L — ABNORMAL LOW (ref 3.5–5.1)
Sodium: 136 mmol/L (ref 135–145)

## 2018-07-20 LAB — CBC
HCT: 26.5 % — ABNORMAL LOW (ref 36.0–46.0)
Hemoglobin: 8.1 g/dL — ABNORMAL LOW (ref 12.0–15.0)
MCH: 27.5 pg (ref 26.0–34.0)
MCHC: 30.6 g/dL (ref 30.0–36.0)
MCV: 89.8 fL (ref 80.0–100.0)
Platelets: 295 10*3/uL (ref 150–400)
RBC: 2.95 MIL/uL — AB (ref 3.87–5.11)
RDW: 19 % — ABNORMAL HIGH (ref 11.5–15.5)
WBC: 35.2 10*3/uL — ABNORMAL HIGH (ref 4.0–10.5)
nRBC: 0.1 % (ref 0.0–0.2)

## 2018-07-20 LAB — URINE CULTURE: Culture: NO GROWTH

## 2018-07-20 LAB — GLUCOSE, CAPILLARY
Glucose-Capillary: 124 mg/dL — ABNORMAL HIGH (ref 70–99)
Glucose-Capillary: 198 mg/dL — ABNORMAL HIGH (ref 70–99)
Glucose-Capillary: 277 mg/dL — ABNORMAL HIGH (ref 70–99)
Glucose-Capillary: 246 mg/dL — ABNORMAL HIGH (ref 70–99)

## 2018-07-20 LAB — MRSA PCR SCREENING: MRSA by PCR: NEGATIVE

## 2018-07-20 LAB — LACTIC ACID, PLASMA: Lactic Acid, Venous: 1.2 mmol/L (ref 0.5–1.9)

## 2018-07-20 MED ORDER — CHLORHEXIDINE GLUCONATE CLOTH 2 % EX PADS
6.0000 | MEDICATED_PAD | Freq: Every day | CUTANEOUS | Status: DC
  Administered 2018-07-20 – 2018-07-23 (×2): 6 via TOPICAL

## 2018-07-20 MED ORDER — DARBEPOETIN ALFA 150 MCG/0.3ML IJ SOSY: 150.0000 ug | PREFILLED_SYRINGE | INTRAMUSCULAR | Status: DC

## 2018-07-20 MED ORDER — CALCITRIOL 0.25 MCG PO CAPS
0.2500 ug | ORAL_CAPSULE | ORAL | Status: DC
  Administered 2018-07-21 – 2018-07-24 (×2): 0.25 ug via ORAL

## 2018-07-20 MED ORDER — ASPIRIN EC 81 MG PO TBEC
81.0000 mg | DELAYED_RELEASE_TABLET | Freq: Every day | ORAL | Status: DC
  Administered 2018-07-20 – 2018-07-24 (×5): 81 mg via ORAL
  Filled 2018-07-20 (×5): qty 1

## 2018-07-20 MED ORDER — NEPRO/CARBSTEADY PO LIQD
237.0000 mL | Freq: Two times a day (BID) | ORAL | Status: DC
  Administered 2018-07-20 – 2018-07-23 (×3): 237 mL via ORAL
  Filled 2018-07-20 (×10): qty 237

## 2018-07-20 MED ORDER — CLOPIDOGREL BISULFATE 75 MG PO TABS
75.0000 mg | ORAL_TABLET | Freq: Every day | ORAL | Status: DC
  Administered 2018-07-20 – 2018-07-24 (×5): 75 mg via ORAL
  Filled 2018-07-20 (×5): qty 1

## 2018-07-20 MED ORDER — RENA-VITE PO TABS
1.0000 | ORAL_TABLET | Freq: Every day | ORAL | Status: DC
  Administered 2018-07-20 – 2018-07-23 (×4): 1 via ORAL
  Filled 2018-07-20 (×4): qty 1

## 2018-07-20 NOTE — Progress Notes (Signed)
Inpatient Diabetes Program Recommendations  AACE/ADA: New Consensus Statement on Inpatient Glycemic Control (2015)  Target Ranges:  Prepandial:   less than 140 mg/dL      Peak postprandial:   less than 180 mg/dL (1-2 hours)      Critically ill patients:  140 - 180 mg/dL   Lab Results  Component Value Date   GLUCAP 124 (H) 07/20/2018   HGBA1C 6.4 (H) 06/30/2018    Review of Glycemic Control Results for Glenda [name], Glenda [name] (MRN [id number]) as of 07/20/2018 10:47  Ref. Range 07/19/2018 19:39 07/20/2018 06:58  Glucose-Capillary Latest Ref Range: 70 - 99 mg/dL 146 (H) 124 (H)   Diabetes history: Type 2 DM Outpatient Diabetes medications: none Current orders for Inpatient glycemic control: none  Inpatient Diabetes Program Recommendations:    If blood glucose trends exceed 180 mg/dL, consider adding Novolog 0-9 units TID.   Thanks, Bronson Curb, MSN, RNC-OB Diabetes Coordinator [phone] (8a-5p)

## 2018-07-20 NOTE — Consult Note (Addendum)
[facility] Renal Consultation Note    Indication for Consultation:  Management of ESRD/hemodialysis; anemia, hypertension/volume and secondary hyperparathyroidism PCP: Angelica Pou, MD  HPI: Glenda [name] is a 85 y.o. female with ESRD (MWF [facility]), hx of HTN, CVA, PAF, DM who is s/p recent admission with d/c 07/04/18 following left AKA revision.  She was seen at dialysis yesterday afebrile and ran most of her treatment.  Net UF was 1.5 L with lowest BP 94/44.  Other BP were within usual range.  She presented to the ED from home with AMS.  Per ED note, at baseline daughter states she is fully oriented. She is followed by [facility].  In the ED she was found to have temp of 100.6, BP in usual range. BNP 2080, Post HD labs BUN 10 Cr 1.97 alk phos 276  K 3.2 (post HD) WBC markedly elevated at 36.8 with 86% N CT head negative acute findings, hgb 9.7 post  HD down to 8.1 this am plts wnl,  CXR NAD Alk phos is elevated at 276 up from previous 150 in January   Seen this am upon returning from xray. No cough, N, V, D, chills, pain anywhere, no sensation in RLE.  Oriented to [place], 2002, March. Doesn't not know name of hospital or president.  Past Medical History:  Diagnosis Date  . Arthritis    patient denies  . Breast cancer ([place])    Breast Cancer right   treated with radiation  . CAD (coronary artery disease)   . Chest pain   . CHF (congestive heart failure) ([place])   . Constipation   . Dizziness   . DM (diabetes mellitus) ([place])    TYpe II  . ESRD (end stage renal disease) ([place])    TTHSAT- [address]  . GERD (gastroesophageal reflux disease)   . HLD (hyperlipidemia)   . HTN (hypertension)   . Obesity   . PAF (paroxysmal atrial fibrillation) ([place])   . Radiation 09/11/15-10/13/15   42.72 Gy to right breast, 12 Gy boost to right breast  . Shortness of breath dyspnea    04/27/2018- not so much since she began dialysis  . Sleep apnea    no longer uses cpap   . Stroke [facility])     balance issues - walks with walker and uses wheelchair  Had 3 in 2017- last 1 was in October. 3/5/2020uses wheel chair   . UTI (urinary tract infection)    Past Surgical History:  Procedure Laterality Date  . ABDOMINAL AORTOGRAM W/LOWER EXTREMITY N/A 04/27/2018   Procedure: ABDOMINAL AORTOGRAM W/LOWER EXTREMITY;  Surgeon: [name], MD;  Location: [place] [facility];  Service: Cardiovascular;  Laterality: N/A;  . AMPUTATION Left 04/28/2018   Procedure: AMPUTATION RAY LEFT;  Surgeon: [name], MD;  Location: [place];  Service: Vascular;  Laterality: Left;  . AMPUTATION Left 05/26/2018   Procedure: AMPUTATION BELOW KNEE, LEFT;  Surgeon: [name], MD;  Location: [place];  Service: Vascular;  Laterality: Left;  . AMPUTATION Left 06/30/2018   Procedure: AMPUTATION ABOVE KNEE;  Surgeon: [name], MD;  Location: [place];  Service: Vascular;  Laterality: Left;  . AV FISTULA PLACEMENT Left 07/14/2017   Procedure: Left arm Brachicephalic Fistula Creation;  Surgeon: Serafina Mitchell, MD;  Location: [place];  Service: Vascular;  Laterality: Left;  . BALLOON DILATION N/A 08/18/2017   Procedure: BALLOON DILATION;  Surgeon: Clarene Essex, MD;  Location: [place];  Service: Endoscopy;  Laterality:  N/A;  . BASCILIC VEIN TRANSPOSITION Left 10/12/2017   Procedure: SECOND STAGE BASILIC VEIN TRANSPOSITION LEFT ARM;  Surgeon: Serafina Mitchell, MD;  Location: [place];  Service: Vascular;  Laterality: Left;  . CHOLECYSTECTOMY    . colon polyps; hx    . CORONARY ANGIOPLASTY  02/14/09  . ESOPHAGOGASTRODUODENOSCOPY (EGD) WITH PROPOFOL Left 09/08/2015   Procedure: ESOPHAGOGASTRODUODENOSCOPY (EGD) WITH PROPOFOL;  Surgeon: Clarene Essex, MD;  Location: [facility] ENDOSCOPY;  Service: Endoscopy;  Laterality: Left;  . ESOPHAGOGASTRODUODENOSCOPY (EGD) WITH PROPOFOL N/A 08/18/2017   Procedure: ESOPHAGOGASTRODUODENOSCOPY (EGD) WITH PROPOFOL;  Surgeon: Clarene Essex, MD;  Location: [place];  Service:  Endoscopy;  Laterality: N/A;  with flouroscopy  . IR FLUORO GUIDE CV LINE RIGHT  08/25/2017  . IR FLUORO GUIDE CV LINE RIGHT  08/29/2017  . IR US GUIDE VASC ACCESS RIGHT  08/25/2017  . LOWER EXTREMITY ANGIOGRAPHY Right 05/24/2018   Procedure: Lower Extremity Angiography;  Surgeon: [name], MD;  Location: [place] [facility];  Service: Cardiovascular;  Laterality: Right;  . PERIPHERAL VASCULAR ATHERECTOMY Right 05/24/2018   Procedure: PERIPHERAL VASCULAR ATHERECTOMY;  Surgeon: [name], MD;  Location: [place] [facility];  Service: Cardiovascular;  Laterality: Right;  Anterial Tibial  . RADIOACTIVE SEED GUIDED PARTIAL MASTECTOMY WITH AXILLARY SENTINEL LYMPH NODE BIOPSY Right 07/17/2015   Procedure: RADIOACTIVE SEED GUIDED PARTIAL MASTECTOMY WITH AXILLARY SENTINEL LYMPH NODE BIOPSY;  Surgeon: Erroll Luna, MD;  Location: [place];  Service: General;  Laterality: Right;   Family History  Problem Relation Age of Onset  . Diabetes Mother   . Colon cancer Father        also had lung  . Diabetes Other   . Heart attack Other   . Diabetes Sister        [name]  . Breast cancer Sister    Social History:  reports that she has never smoked. She has never used smokeless tobacco. She reports that she does not drink alcohol or use drugs. No Known Allergies Prior to Admission medications   Medication Sig Start Date End Date Taking? Authorizing Provider  acetaminophen (TYLENOL) 500 MG tablet Take 1,000 mg by mouth at bedtime as needed for headache (pain).    Yes [provider]  aspirin 81 MG EC tablet Take 1 tablet (81 mg total) by mouth every evening. Patient taking differently: Take 81 mg by mouth daily.  05/30/18  Yes Dagoberto Ligas, PA-C  calcium acetate (PHOSLO) 667 MG capsule Take 667 mg by mouth 3 (three) times daily with meals.   Yes [provider]  citalopram (CELEXA) 20 MG tablet Take 20 mg by mouth daily.    Yes [provider]  donepezil (ARICEPT) 5  MG tablet Take 5 mg by mouth at bedtime.   Yes [provider]  HYDROmorphone (DILAUDID) 2 MG tablet Take 1 tablet (2 mg total) by mouth every 4 (four) hours as needed for moderate pain or severe pain. 07/04/18  Yes Velna Ochs, MD  lidocaine-prilocaine (EMLA) cream Apply 1 application topically See admin instructions. Apply topically prior to dialysis on Monday, Wednesday, Friday   Yes [provider]  multivitamin (RENA-VIT) TABS tablet Take 1 tablet by mouth daily.   Yes [provider]  pantoprazole (PROTONIX) 20 MG tablet Take 20 mg by mouth daily.    Yes [provider]  polyethylene glycol (MIRALAX / GLYCOLAX) packet Take 17 g by mouth daily. Mix in beverage of choice and drink   Yes [provider]  pravastatin (PRAVACHOL) 40 MG tablet Take 40 mg by mouth daily.    Yes [provider]  senna-docusate (SENNA S) 8.6-50 MG tablet Take 2 tablets by mouth at bedtime as needed (to maintain comfortable bowel patterns).   Yes [provider]  Amino Acids-Protein Hydrolys (FEEDING SUPPLEMENT, PRO-STAT SUGAR FREE 64,) LIQD Take 30 mLs by mouth 3 (three) times daily with meals.    [provider]  clopidogrel (PLAVIX) 75 MG tablet Take 1 tablet (75 mg total) by mouth daily. Patient not taking: Reported on 07/19/2018 05/30/18   Dagoberto Ligas, PA-C  feeding supplement, GLUCERNA SHAKE, (GLUCERNA SHAKE) LIQD Take 237 mLs by mouth daily.    [provider]   Current Facility-Administered Medications  Medication Dose Route Frequency Provider Last Rate Last Dose  . acetaminophen (TYLENOL) tablet 650 mg  650 mg Oral Q6H PRN Neva Seat, MD       Or  . acetaminophen (TYLENOL) suppository 650 mg  650 mg Rectal Q6H PRN Neva Seat, MD      . aspirin EC tablet 81 mg  81 mg Oral Daily Neva Seat, MD      . calcium acetate (PHOSLO) capsule 667 mg  667 mg Oral TID WC Neva Seat, MD      . ceFEPIme  (MAXIPIME) 1 g in sodium chloride 0.9 % 100 mL IVPB  1 g Intravenous Q24H Neva Seat, MD      . clopidogrel (PLAVIX) tablet 75 mg  75 mg Oral Daily Neva Seat, MD      . donepezil (ARICEPT) tablet 5 mg  5 mg Oral QHS Neva Seat, MD   5 mg at 07/20/18 0115  . heparin injection 5,000 Units  5,000 Units Subcutaneous Alphia Moh, MD      . multivitamin (RENA-VIT) tablet 1 tablet  1 tablet Oral Daily Neva Seat, MD      . pantoprazole (PROTONIX) EC tablet 20 mg  20 mg Oral Daily Neva Seat, MD      . polyethylene glycol (MIRALAX / Floria Raveling) packet 17 g  17 g Oral Daily Neva Seat, MD      . potassium chloride SA (K-DUR,KLOR-CON) CR tablet 40 mEq  40 mEq Oral Once Neva Seat, MD      . pravastatin (PRAVACHOL) tablet 40 mg  40 mg Oral Daily Neva Seat, MD      . senna-docusate (Senokot-S) tablet 2 tablet  2 tablet Oral QHS PRN Neva Seat, MD      . sodium chloride flush (NS) 0.9 % injection 3 mL  3 mL Intravenous Q12H Neva Seat, MD      . sodium chloride flush (NS) 0.9 % injection 3 mL  3 mL Intravenous PRN Neva Seat, MD      . Derrill Memo ON 07/21/2018] vancomycin (VANCOCIN) IVPB 750 mg/150 ml premix  750 mg Intravenous Q M,W,F-HD Neva Seat, MD       Labs: Basic Metabolic Panel: Recent Labs  Lab 07/19/18 1940 07/20/18 0522  NA 135 136  K 3.2* 3.2*  CL 93* 100  CO2 29 28  GLUCOSE 144* 123*  BUN 10 15  CREATININE 1.97* 2.47*  CALCIUM 8.1* 7.9*   Liver Function Tests: Recent Labs  Lab 07/19/18 1940  AST 37  ALT 16  ALKPHOS 276*  BILITOT 0.6  PROT 7.1  ALBUMIN 1.8*   No results for input(s): LIPASE, AMYLASE in the last 168 hours. No results for input(s): AMMONIA in the last 168 hours. CBC: Recent Labs  Lab  07/19/18 1940 07/20/18 0522  WBC 36.8* 35.2*  NEUTROABS 31.5*  --   HGB 9.7* 8.1*  HCT 32.7* 26.5*  MCV 92.4 89.8  PLT 346 295   Cardiac Enzymes: No results for input(s): CKTOTAL,  CKMB, CKMBINDEX, TROPONINI in the last 168 hours. CBG: Recent Labs  Lab 07/19/18 1939 07/20/18 0658  GLUCAP 146* 124*   Iron Studies: No results for input(s): IRON, TIBC, TRANSFERRIN, FERRITIN in the last 72 hours. Studies/Results: Dg Chest 2 View  Result Date: 07/19/2018 CLINICAL DATA:  85 year old female fever and altered mental status. EXAM: CHEST - 2 VIEW COMPARISON:  01/06/2018 and earlier. FINDINGS: Chronic elevation of the right hemidiaphragm is stable. Mediastinal contours remain within normal limits. Visualized tracheal air column is within normal limits. Mild chronic increased interstitial markings are stable. No pneumothorax, pulmonary edema, pleural effusion or acute pulmonary opacity. Negative visible bowel gas pattern. No acute osseous abnormality identified. Questionable aneurysmal enlargement of abdominal aortic calcifications in the abdomen on the lateral view. IMPRESSION: 1. No acute cardiopulmonary abnormality. 2. Questionable abdominal aortic aneurysm judging from vascular calcifications on the lateral view. Consider Aorta Ultrasound to evaluate further. Electronically Signed   By: Genevie Ann M.D.   On: 07/19/2018 20:55   Ct Head Wo Contrast  Result Date: 07/19/2018 CLINICAL DATA:  Altered mental status. EXAM: CT HEAD WITHOUT CONTRAST TECHNIQUE: Contiguous axial images were obtained from the base of the skull through the vertex without intravenous contrast. COMPARISON:  Head CT 06/13/2016 FINDINGS: Brain: No intracranial hemorrhage, mass effect, or midline shift. Unchanged degree of atrophy and chronic small vessel ischemia. Remote lacunar infarct in the right basal ganglia and thalamus5. Remote infarct in the left pons. Small remote right cerebellar infarct. No hydrocephalus. The basilar cisterns are patent. No evidence of territorial infarct or acute ischemia. No extra-axial or intracranial fluid collection. Vascular: Atherosclerosis of skullbase vasculature without hyperdense  vessel or abnormal calcification. Skull: No fracture or focal lesion. Sinuses/Orbits: Mild motion artifact. No acute findings. Other: None. IMPRESSION: 1. No acute intracranial abnormality. 2. Unchanged atrophy, chronic small vessel ischemia and remote lacunar infarcts. Electronically Signed   By: Keith Rake M.D.   On: 07/19/2018 20:44   Vas [place] Lower Extremity Venous (dvt)  Result Date: 07/20/2018  Lower Venous Study Indications: Edema.  Risk Factors: Recent left AKA. ESRD on dialysis. Comparison Study: No prior study on file for comparison. Performing Technologist: Sharion Dove RVS  Examination Guidelines: A complete evaluation includes B-mode imaging, spectral Doppler, color Doppler, and power Doppler as needed of all accessible portions of each vessel. Bilateral testing is considered an integral part of a complete examination. Limited examinations for reoccurring indications may be performed as noted.  Right Venous Findings: +---------+---------------+---------+-----------+----------+----------+          CompressibilityPhasicitySpontaneityPropertiesSummary    +---------+---------------+---------+-----------+----------+----------+ CFV      Full                                         pulsatile  +---------+---------------+---------+-----------+----------+----------+ SFJ      Full                                                    +---------+---------------+---------+-----------+----------+----------+ FV Prox  Full  pulsatile  +---------+---------------+---------+-----------+----------+----------+ FV Mid   Full                                                    +---------+---------------+---------+-----------+----------+----------+ FV DistalFull                                                    +---------+---------------+---------+-----------+----------+----------+ PFV      Full                                                     +---------+---------------+---------+-----------+----------+----------+ POP      Full                                                    +---------+---------------+---------+-----------+----------+----------+ PTV                                                   visualized +---------+---------------+---------+-----------+----------+----------+ PERO                                                  visualized +---------+---------------+---------+-----------+----------+----------+ GSV      Full                                                    +---------+---------------+---------+-----------+----------+----------+  Left Venous Findings: +---+---------------+---------+-----------+----------+---------+    CompressibilityPhasicitySpontaneityPropertiesSummary   +---+---------------+---------+-----------+----------+---------+ CFVFull                                         pulsatile +---+---------------+---------+-----------+----------+---------+    Summary: Right: There is no evidence of deep vein thrombosis in the lower extremity. Ultrasound characteristics of enlarged lymph nodes are noted in the groin. Left: No evidence of common femoral vein obstruction. Ultrasound characteristics of enlarged lymph nodes noted in the groin.  *See table(s) above for measurements and observations.    Preliminary     ROS: As per HPI otherwise negative.  Physical Exam: Vitals:   07/20/18 0034 07/20/18 0034 07/20/18 0527 07/20/18 0852  BP:  (!) 134/42 (!) 135/44 (!) 146/52  Pulse:  76 77 74  Resp:  '18 18 20  ' Temp:  99.1 F (37.3 C) 99.4 F (37.4 C) 98.7 F (37.1 C)  TempSrc:  Oral Oral Oral  SpO2:  99% 95% 98%  Weight: 60.2 kg        General: pleasant elderly female, NAD breathing easily  supine in bed Head: NCAT sclera not icteric MMM Neck: Supple.  Lungs: CTA bilaterally without wheezes, rales, or rhonchi. Breathing is unlabored. Heart: RRR with S1 S2.   Abdomen: soft NT throughout, +  BS Lower extremities:, left AKA healing well without edema, right LE edema with  great toe dry gangrene and darkness right 2nd toe tip Neuro: A & O  X 3. Moves all extremities spontaneously. Psych:  Responds to questions appropriately with a normal affect. Dialysis Access: left upper AVF + bruit no evidence of infection.  Dialysis Orders:  In Ctr HD, MonWedFri, 4 hrs 15 min, 180NRe Optiflux, BFR 400, DFR Manual 800 mL/min, EDW 56 (kg), Dialysate 2.0 K, 2.0 Ca, 1.0 Mg, 100 Dextrose (G2201), Sodium 137 (mEq/L), Bicarb Setting: 35 (mEq/L), UFR Profile: None, Sodium Model: None, Access: [email or web address], Clinic 1007- Entered By RBencalo  Heparin 5000 venofer 100 through 3/30 Mircera 150 last given 3/18 Calcitrol 0.25 just lowered due to decline in iPTH Recent labs: hgb 7.6 3/18 15% sat iPTH 87   Assessment/Plan: 1. Sepsis - cultures pending/ empiric antiobitics- does have elevated alk phos but no GI sx - with normal iPTH and prior alk phos levels no higher than 162 per outpatient labs this past year,.right foot xray neg for osteo, RL doppler neg for DVT 2. ESRD -  MWF- next HD Friday K 3.2 due to post HD status - may need 3 K bath for d/c - start on 4 K bath Friday (did get 40 KCl this am) 3. Hypertension/volume  - has been leaving several kg above EDW- UF as BP allows 4. Anemia  - hgb 8.1 up from last outpatient hgb of 7.6 - hold on continuing Fe for now - redose ESA due  5. Metabolic bone disease - continue VDRA  6. Malnutrition -soft diet/vits/supplements - ate at least half of breakfast. 7. Dementia - with worsening confusion likely due to infection  Myriam Jacobson, PA-C [address] [phone] 07/20/2018, 10:30 AM   I have seen and examined this patient and agree with plan and assessment in the above note with renal recommendations/intervention highlighted.  No clear source for her leukocytosis or temperature.  Dry gangrene of right  great toe, s/p recent revision of LAKA.  Access without evidence of infection.   Will continue with HD MWF while she remains an inpatient.  Broadus John A Inga Noller,MD 07/20/2018 11:57 AM

## 2018-07-20 NOTE — Progress Notes (Signed)
   Subjective: Glenda [name] reported feeling well this morning. Denies any pain, no burning with urination, no diarrhea, no headaches. She was alert and oriented on exam.   Objective:  Vital signs in last 24 hours: Vitals:   07/20/18 0034 07/20/18 0034 07/20/18 0527 07/20/18 0852  BP:  (!) 134/42 (!) 135/44 (!) 146/52  Pulse:  76 77 74  Resp:  18 18 20   Temp:  99.1 F (37.3 C) 99.4 F (37.4 C) 98.7 F (37.1 C)  TempSrc:  Oral Oral Oral  SpO2:  99% 95% 98%  Weight: 60.2 kg      Physical Exam Gen: seen comfortably sleeping, no distress CV: RRR, no murmurs Pulm: left sided bronchial breath sounds, right side CTA  Abdomen: soft, non-distended, non tender, no TTP Ext: left AKA, incision site clean and dry, right LE foot wound clean and dry no drainage, warm and well perfused, AVF site on left arm clean and dry   Assessment/Plan:  Active Problems:   Sepsis ([place])  Glenda [name] is an 85 year old female with ESRD, complicated peripheral artery disease s/p left AKA, diabetes, chronic diastolic CHF, and hypertension who presents with confusion signs of sepsis (fever, elevated lactic acid, and leukocytosis).  She also has signs of fluid overload and elevated BNP consistent with acute on chronic diastolic heart failure.  Sepsis: Unclear source at this point.  Differential diagnosis soft tissue infection of chronic right lower extremity wound vs bacteremia 2/2 HD site (AVF doesn't have signs of infection).  Left lower AKA surgical site shows no signs of infection. Her mental status improved s/p vancomycin, cefepime and metronidazole. Will continue antibiotics and observe blood cultures for 24 hours. Will also order a foot xray to rule out infection. - Continue broad-spectrum antibiotics with vancomycin 150 mg every Monday-Wednesday-Friday with HD and cefepime 1 g every 24 hours - Follow up foot xray - Blood cultures and urine cultures pending - Lactic acid 1.2 - Tylenol PRN fever and pain   Acute on chronic diastolic heart failure: She has an elevated BNP at 2080 and right LE edema. - Continuous cardiac monitoring - Will hold off on diuretics in the setting of active infection - Right lower extremity ultrasound   Hypokalemia: - S/p potassium 40 mEq - BMP  ?Abdominal aortic aneurysm: CXR showed aneurysm. Ordered an abdominal ultrasound    Dispo: Anticipated discharge pending infectious workup.   Mike Craze, DO 07/20/2018, 8:57 AM Pager: [phone]

## 2018-07-20 NOTE — Care Management (Addendum)
Left hippa compliant VM with Glenda [name] at [facility] to request call back. Will notify [facility] that patient has been admitted to hospital when they call back.   Spoke w daughter Curt Bears over the phone. Curt Bears states that patient lives at home alone, has three daughters that rotate staying with her so that she has 24/7 support and supervision. She states that [facility] provides transport to HD and on days that she doesn't have HD she goes to goes to their day program.  Provided brief update and she was emotional about visitation restrictions, as her mother is used to having someone at her bedside 24 hours a day. Provided emotional support and reassurance. Encouraged her call nurse for updates and possible video chats with patient.

## 2018-07-20 NOTE — Progress Notes (Signed)
VASCULAR LAB PRELIMINARY  PRELIMINARY  PRELIMINARY  PRELIMINARY  Right lower extremity venous duplex completed.    Preliminary report:  See CV Proc for preliminary results.  Dameisha Tschida, RVT 07/20/2018, 9:36 AM

## 2018-07-21 ENCOUNTER — Inpatient Hospital Stay (HOSPITAL_COMMUNITY): Payer: Medicare (Managed Care)

## 2018-07-21 DIAGNOSIS — Z992 Dependence on renal dialysis: Secondary | ICD-10-CM

## 2018-07-21 DIAGNOSIS — I70261 Atherosclerosis of native arteries of extremities with gangrene, right leg: Secondary | ICD-10-CM

## 2018-07-21 DIAGNOSIS — L97519 Non-pressure chronic ulcer of other part of right foot with unspecified severity: Secondary | ICD-10-CM

## 2018-07-21 DIAGNOSIS — N186 End stage renal disease: Secondary | ICD-10-CM

## 2018-07-21 DIAGNOSIS — E10621 Type 1 diabetes mellitus with foot ulcer: Secondary | ICD-10-CM

## 2018-07-21 LAB — SEDIMENTATION RATE: Sed Rate: 68 mm/hr — ABNORMAL HIGH (ref 0–22)

## 2018-07-21 LAB — RENAL FUNCTION PANEL
Albumin: 1.5 g/dL — ABNORMAL LOW (ref 3.5–5.0)
Anion gap: 9 (ref 5–15)
BUN: 25 mg/dL — ABNORMAL HIGH (ref 8–23)
CO2: 26 mmol/L (ref 22–32)
Calcium: 8.5 mg/dL — ABNORMAL LOW (ref 8.9–10.3)
Chloride: 102 mmol/L (ref 98–111)
Creatinine, Ser: 3.34 mg/dL — ABNORMAL HIGH (ref 0.44–1.00)
GFR calc Af Amer: 14 mL/min — ABNORMAL LOW (ref >60)
GFR calc non Af Amer: 12 mL/min — ABNORMAL LOW (ref >60)
Glucose, Bld: 218 mg/dL — ABNORMAL HIGH (ref 70–99)
Phosphorus: 2.6 mg/dL (ref 2.5–4.6)
Potassium: 4.3 mmol/L (ref 3.5–5.1)
Sodium: 137 mmol/L (ref 135–145)

## 2018-07-21 LAB — CBC
HCT: 26.1 % — ABNORMAL LOW (ref 36.0–46.0)
Hemoglobin: 8.1 g/dL — ABNORMAL LOW (ref 12.0–15.0)
MCH: 28.3 pg (ref 26.0–34.0)
MCHC: 31 g/dL (ref 30.0–36.0)
MCV: 91.3 fL (ref 80.0–100.0)
Platelets: 273 10*3/uL (ref 150–400)
RBC: 2.86 MIL/uL — ABNORMAL LOW (ref 3.87–5.11)
RDW: 19.4 % — ABNORMAL HIGH (ref 11.5–15.5)
WBC: 39.5 10*3/uL — ABNORMAL HIGH (ref 4.0–10.5)
nRBC: 0.1 % (ref 0.0–0.2)

## 2018-07-21 LAB — GLUCOSE, CAPILLARY
Glucose-Capillary: 124 mg/dL — ABNORMAL HIGH (ref 70–99)
Glucose-Capillary: 128 mg/dL — ABNORMAL HIGH (ref 70–99)
Glucose-Capillary: 123 mg/dL — ABNORMAL HIGH (ref 70–99)
Glucose-Capillary: 117 mg/dL — ABNORMAL HIGH (ref 70–99)

## 2018-07-21 LAB — C-REACTIVE PROTEIN: CRP: 27.1 mg/dL — ABNORMAL HIGH (ref <1.0)

## 2018-07-21 LAB — SAVE SMEAR(SSMR), FOR PROVIDER SLIDE REVIEW

## 2018-07-21 MED ORDER — VANCOMYCIN HCL IN DEXTROSE 750-5 MG/150ML-% IV SOLN
INTRAVENOUS | Status: AC
  Administered 2018-07-21: 750 mg via INTRAVENOUS
  Filled 2018-07-21: qty 150

## 2018-07-21 MED ORDER — CALCITRIOL 0.25 MCG PO CAPS
ORAL_CAPSULE | ORAL | Status: AC
  Administered 2018-07-21: 0.25 ug via ORAL
  Filled 2018-07-21: qty 1

## 2018-07-21 MED ORDER — INSULIN ASPART 100 UNIT/ML ~~LOC~~ SOLN
0.0000 [IU] | Freq: Three times a day (TID) | SUBCUTANEOUS | Status: DC
  Administered 2018-07-21 – 2018-07-22 (×3): 1 [IU] via SUBCUTANEOUS
  Administered 2018-07-22 (×2): 2 [IU] via SUBCUTANEOUS
  Administered 2018-07-23 (×2): 3 [IU] via SUBCUTANEOUS
  Administered 2018-07-23: 5 [IU] via SUBCUTANEOUS

## 2018-07-21 NOTE — Progress Notes (Addendum)
   Subjective: No overnight events. Seen during HD. States she is tired but has no other complaints. Denies abdominal pain, n/v, pain or burning with urination.  Objective:  Vital signs in last 24 hours: Vitals:   07/20/18 0852 07/20/18 1629 07/20/18 2140 07/21/18 0521  BP: (!) 146/52 (!) 150/51 (!) 159/51 (!) 173/51  Pulse: 74 77 77 77  Resp: 20 20 19 20   Temp: 98.7 F (37.1 C) 98.7 F (37.1 C) 98.4 F (36.9 C) 98 F (36.7 C)  TempSrc: Oral Oral Oral Oral  SpO2: 98% 100% 100% 100%  Weight:   57.9 kg    Physical Exam Gen: seen comfortably resting during HD, no distress Abdomen: soft, non tender, non distended, no ttp Ext: left AKA with clean and dry incision site, right LE foot wound clean and dry no drainage, chronic ischemic changes, no edema   Assessment/Plan:  Principal Problem:   Fever Active Problems:   Diabetes mellitus type 1, uncontrolled, insulin dependent (HCC)   PAD (peripheral artery disease) (HCC)   Leukocytosis  Ms. Mooreisan 85 year old female with ESRD, complicated peripheral artery diseases/pleft AKA, diabetes,chronic diastolic CHF,andhypertension who presents withconfusionsigns of sepsis (fever, elevated lactic acid, and leukocytosis). She also has signs of fluid overload and elevated BNP consistent with acute on chronic diastolic heart failure.  Sepsis: Unclear sourceat this point. WBC continues to trend up, 39 today.Differential diagnosis soft tissue infection of chronic right lower extremity wound vsbacteremia 2/2 HD site (AVF doesn't have signs of infection). Foot xray negative for osteomyelitis. Right great toe with chronic ischemic changes, no surrounding cellulitis. Will consult VVS, appreciate recommendations. - Continue vancomycin and cefepime  - Blood culturesand urine culturesNGTD - Tylenol PRN fever and pain  Acute on chronic diastolic heart failure: - Continuous cardiac monitoring - Will hold off on diuretics in the  setting of active infection - Right lower extremity ultrasound negative for DVT  Hypokalemia: - 4.3, stable  - BMP  ?Abdominal aortic aneurysm:CXR showed aneurysm. Vascular US canceled because that can not be done. Will order a aorta ultrasound   Diabetes: Takes Humalog 50-50 20 units qAM and 45 units qPM. However, after her last hospital admission her insulin was held due to hypoglycemic episodes. Family also reports they often hold insulin unless CBGs >200s.   - sliding scale tid w/ meals - CBG checks  Dispo: Anticipated discharge pending clinical improvement.   Mike Craze, DO 07/21/2018, 7:25 AM Pager: [phone]

## 2018-07-21 NOTE — Progress Notes (Signed)
Pharmacy Antibiotic Note  Glenda [name] is a 85 y.o. female admitted on 07/19/2018 with sepsis of unknown source.  Pharmacy has been consulted for vancomycin and cefepime dosing. Patient with ESRD HD-MWF, receiving dialysis today. Xray of foot on 3/26 negative for osteomyelitis. WBC remain elevated at 39.5, afebrile  Plan: Cefepime 1g IV q24h Vancomycin 750 mg IV qHD-MWF Monitor HD schedule, C&S, de-escalation/ LOT  Weight: 131 lb 2.8 oz (59.5 kg)  Temp (24hrs), Avg:98.3 F (36.8 C), Min:98 F (36.7 C), Max:98.7 F (37.1 C)  Recent Labs  Lab 07/19/18 1940 07/19/18 1948 07/20/18 0522 07/21/18 0318  WBC 36.8*  --  35.2* 39.5*  CREATININE 1.97*  --  2.47* 3.34*  LATICACIDVEN  --  3.4* 1.2  --     Estimated Creatinine Clearance: 12.2 mL/min (A) (by C-G formula based on SCr of 3.34 mg/dL (H)).    No Known Allergies  Antimicrobials this admission: Vancomcyin 3/25 >>  Cefepime 3/25 >>  Flagyl 3/25 x1  Dose adjustments this admission: N/A  Microbiology results: 3/25BCx: ngtd x2 3/25UCx: ngF MRSA pcr neg  Thank you for allowing pharmacy to be a part of this patient's care.  Harrietta Guardian, PharmD PGY1 Pharmacy Resident 07/21/2018    11:07 AM Please check AMION for all [place] numbers

## 2018-07-21 NOTE — Progress Notes (Signed)
Internal Medicine Teaching Service Attending:   I saw and examined the patient. I reviewed the resident's note and I agree with the resident's findings and plan as documented in the resident's note.  Principal Problem:   Fever Active Problems:   Diabetes mellitus type 1, uncontrolled, insulin dependent (HCC)   PAD (peripheral artery disease) ([place])   Leukocytosis  Hospital day #3 for this 85 year old person with ESRD on HD admitted with fever and severe leukocytosis with left shift, but no source of infection found. Clinically she is stable, seen in HD this morning, and is without complaints. Fever curve is much improved, but the leukocytosis remains unchanged. She is one month out from a left sided AKA for arterial disease and poorly healed BKA, but the surgical incision looks fine with no soft tissue infection. She has a dry ulcer at the right great toe, no purulence or odor, xray ok, inflammatory markers pending, some right inguinal adenopathy on ultrasound. The right toe is the most likely source of an infection, so we will ask vascular surgery to evaluate it. Blood cultures are no growth at 2 days, the urine culture has no growth, the AV fistula is working well. Will continue antibiotics for today and monitor another day in house to see if we can locate an infection, given her comorbidities she is high risk for a bad outcome if we miss an occult infection.   Lalla Brothers, MD FACP

## 2018-07-21 NOTE — Progress Notes (Signed)
Inpatient Diabetes Program Recommendations  AACE/ADA: New Consensus Statement on Inpatient Glycemic Control (2015)  Target Ranges:  Prepandial:   less than 140 mg/dL      Peak postprandial:   less than 180 mg/dL (1-2 hours)      Critically ill patients:  140 - 180 mg/dL   Lab Results  Component Value Date   GLUCAP 246 (H) 07/20/2018   HGBA1C 6.4 (H) 06/30/2018    Review of Glycemic Control  Results for CARMELA, PIECHOWSKI (MRN [id number]) as of 07/21/2018 10:01  Ref. Range 07/20/2018 11:33 07/20/2018 16:31 07/20/2018 21:42  Glucose-Capillary Latest Ref Range: 70 - 99 mg/dL 198 (H) 277 (H) 246 (H)   Diabetes history: Type 2 DM Outpatient Diabetes medications: none Current orders for Inpatient glycemic control: none  Inpatient Diabetes Program Recommendations:    If blood glucose trends exceed 180 mg/dL, consider adding Novolog 0-9 units TID & HS.   Thanks, Bronson Curb, MSN, RNC-OB Diabetes Coordinator [phone] (8a-5p)

## 2018-07-21 NOTE — Procedures (Signed)
I was present at this dialysis session. I have reviewed the session itself and made appropriate changes.   Vital signs in last 24 hours:  Temp:  [98 F (36.7 C)-98.7 F (37.1 C)] 98 F (36.7 C) (03/27 0521) Pulse Rate:  [77] 77 (03/27 0521) Resp:  [19-20] 20 (03/27 0521) BP: (150-173)/(51) 173/51 (03/27 0521) SpO2:  [100 %] 100 % (03/27 0521) Weight:  [57.9 kg] 57.9 kg (03/26 2140) Weight change: -7 kg Filed Weights   07/19/18 1927 07/20/18 0034 07/20/18 2140  Weight: 64.9 kg 60.2 kg 57.9 kg    Recent Labs  Lab 07/21/18 0318  NA 137  K 4.3  CL 102  CO2 26  GLUCOSE 218*  BUN 25*  CREATININE 3.34*  CALCIUM 8.5*  PHOS 2.6    Recent Labs  Lab 07/19/18 1940 07/20/18 0522 07/21/18 0318  WBC 36.8* 35.2* 39.5*  NEUTROABS 31.5*  --   --   HGB 9.7* 8.1* 8.1*  HCT 32.7* 26.5* 26.1*  MCV 92.4 89.8 91.3  PLT 346 295 273    Scheduled Meds: . aspirin EC  81 mg Oral Daily  . calcitRIOL  0.25 mcg Oral Q M,W,F-HD  . calcium acetate  667 mg Oral TID WC  . Chlorhexidine Gluconate Cloth  6 each Topical Q0600  . clopidogrel  75 mg Oral Daily  . [START ON 07/26/2018] darbepoetin (ARANESP) injection - DIALYSIS  150 mcg Intravenous Q Wed-HD  . donepezil  5 mg Oral QHS  . feeding supplement (NEPRO CARB STEADY)  237 mL Oral BID BM  . heparin  5,000 Units Subcutaneous Q8H  . multivitamin  1 tablet Oral QHS  . pantoprazole  20 mg Oral Daily  . polyethylene glycol  17 g Oral Daily  . pravastatin  40 mg Oral Daily  . sodium chloride flush  3 mL Intravenous Q12H   Continuous Infusions: . ceFEPime (MAXIPIME) IV 1 g (07/20/18 2222)  . vancomycin     PRN Meds:.acetaminophen **OR** acetaminophen, senna-docusate, sodium chloride flush    Assessment and plan: 1. Sepsis- cultures negative thus far.  On vanc/maxipime 2. ESRD- cont with HD qMWF. 3. HTN/volume - stable 4. Dementia- unclear that she is any different than her baseline.  Continue to follow.  5. Anemia of CKD- continue with  ESA Donetta Potts,  MD 07/21/2018, 9:40 AM

## 2018-07-21 NOTE — Consult Note (Addendum)
Patient name: Glenda [name] MRN: [id number] DOB: 1933/10/17 Sex: female  REASON FOR CONSULT:   Right leg wounds with sepsis.  The consult is requested by the medical service.  HPI:   Glenda [name] is a pleasant 85 y.o. female who is well-known to our service.  The patient had a left below the knee amputation which subsequently had to be right revised to an above-the-knee amputation.  This surgery was performed on 06/30/2018.  On 05/24/2018 the patient had presented with critical limb ischemia with dry gangrene of the right great toe.  The patient underwent atherectomy and angioplasty of the anterior tibial artery by Dr. Carlis Abbott.   The patient also has an upper arm hemodialysis access in the left.  She had dialysis today.  She was admitted with fever and leukocytosis.  Vascular surgery was consulted.   Current Facility-Administered Medications  Medication Dose Route Frequency Provider Last Rate Last Dose  . acetaminophen (TYLENOL) tablet 650 mg  650 mg Oral Q6H PRN Neva Seat, MD       Or  . acetaminophen (TYLENOL) suppository 650 mg  650 mg Rectal Q6H PRN Neva Seat, MD      . aspirin EC tablet 81 mg  81 mg Oral Daily Neva Seat, MD   81 mg at 07/21/18 1310  . calcitRIOL (ROCALTROL) capsule 0.25 mcg  0.25 mcg Oral Q M,W,F-HD Alric Seton, PA-C   0.25 mcg at 07/21/18 1057  . calcium acetate (PHOSLO) capsule 667 mg  667 mg Oral TID WC Neva Seat, MD   667 mg at 07/21/18 1311  . ceFEPIme (MAXIPIME) 1 g in sodium chloride 0.9 % 100 mL IVPB  1 g Intravenous Q24H Neva Seat, MD 200 mL/hr at 07/20/18 2222 1 g at 07/20/18 2222  . Chlorhexidine Gluconate Cloth 2 % PADS 6 each  6 each Topical Q0600 Alric Seton, PA-C   6 each at 07/20/18 1227  . clopidogrel (PLAVIX) tablet 75 mg  75 mg Oral Daily Neva Seat, MD   75 mg at 07/21/18 1311  . [START ON 07/26/2018] Darbepoetin Alfa (ARANESP) injection 150 mcg  150 mcg Intravenous Q Wed-HD Alric Seton,  PA-C      . donepezil (ARICEPT) tablet 5 mg  5 mg Oral QHS Neva Seat, MD   5 mg at 07/20/18 2210  . feeding supplement (NEPRO CARB STEADY) liquid 237 mL  237 mL Oral BID BM Alric Seton, PA-C   237 mL at 07/20/18 1227  . heparin injection 5,000 Units  5,000 Units Subcutaneous Q8H Neva Seat, MD   5,000 Units at 07/21/18 1310  . insulin aspart (novoLOG) injection 0-9 Units  0-9 Units Subcutaneous TID WC Rehman, Areeg N, DO   1 Units at 07/21/18 1310  . multivitamin (RENA-VIT) tablet 1 tablet  1 tablet Oral QHS Alric Seton, PA-C   1 tablet at 07/20/18 2211  . pantoprazole (PROTONIX) EC tablet 20 mg  20 mg Oral Daily Neva Seat, MD   20 mg at 07/21/18 1311  . polyethylene glycol (MIRALAX / GLYCOLAX) packet 17 g  17 g Oral Daily Neva Seat, MD   17 g at 07/21/18 1309  . pravastatin (PRAVACHOL) tablet 40 mg  40 mg Oral Daily Neva Seat, MD   40 mg at 07/21/18 1311  . senna-docusate (Senokot-S) tablet 2 tablet  2 tablet Oral QHS PRN Neva Seat, MD      . sodium chloride flush (NS) 0.9 % injection 3 mL  3 mL Intravenous Q12H Neva Seat, MD  3 mL at 07/20/18 2210  . sodium chloride flush (NS) 0.9 % injection 3 mL  3 mL Intravenous PRN Neva Seat, MD      . vancomycin (VANCOCIN) IVPB 750 mg/150 ml premix  750 mg Intravenous Q M,W,F-HD Neva Seat, MD   Stopped at 07/21/18 1157    REVIEW OF SYSTEMS:  [X]  denotes positive finding, [ ]  denotes negative finding Vascular    Leg swelling    Cardiac    Chest pain or chest pressure:    Shortness of breath upon exertion:    Short of breath when lying flat:    Irregular heart rhythm:    Constitutional    Fever or chills:     PHYSICAL EXAM:   Vitals:   07/21/18 1100 07/21/18 1130 07/21/18 1156 07/21/18 1301  BP: (!) 95/40 (!) 90/39 (!) 131/51 (!) 142/57  Pulse: 73 91 78 84  Resp:   18 20  Temp:   98.2 F (36.8 C) 98.1 F (36.7 C)  TempSrc:   Oral Oral  SpO2:   100% 92%  Weight:    57.3 kg     GENERAL: The patient is a well-nourished female, in no acute distress. The vital signs are documented above. CARDIOVASCULAR: There is a regular rate and rhythm. PULMONARY: There is good air exchange bilaterally without wheezing or rales. VASCULAR: Her left above-the-knee amputation site is healing nicely.  There is no erythema or drainage. I cannot palpate pulses in the right foot. The left upper arm access has an excellent thrill with no signs of infection. EXTREMITIES: She has dry gangrene on the right heel, right great toe, and right fifth toe.  DATA:   ARTERIAL DUPLEX: I did review the arterial duplex scan that was done on 06/20/2018.  The patient had biphasic waveforms through the anterior tibial artery on the right.  VENOUS DUPLEX: I have reviewed the venous duplex scan that was done yesterday.  On the right side there is no evidence of DVT.  MEDICAL ISSUES:   DRY GANGRENE RIGHT FOOT: This patient has dry gangrene of the right heel, right great toe, and right fifth toe.  She is undergone a previous tibial intervention by Dr. Carlis Abbott.  I will review her arteriogram and obtain ABIs.  I suspect she has no further options for revascularization.  I do not think the dry wounds on the right foot are currently a source of sepsis.  I will make further recommendations after reviewing her arteriogram and Doppler studies.  [name] [facility] [phone]

## 2018-07-21 NOTE — Progress Notes (Signed)
Spoke with SW at [facility]. Per notes patient not read for transition home. On Call SW from [facility] to follow over weekend. If patient transitions home over weekend, family may transport home. CM to follow for transition of care needs.   Bartholomew Crews, RN MSN CCM Transitions of Care 36M CM [phone]

## 2018-07-21 NOTE — Progress Notes (Signed)
Patient gave consent for daughters to receive information about her care. Family updated.

## 2018-07-22 LAB — BASIC METABOLIC PANEL
Anion gap: 10 (ref 5–15)
Anion gap: 12 (ref 5–15)
BUN: 13 mg/dL (ref 8–23)
BUN: 18 mg/dL (ref 8–23)
CALCIUM: 8.3 mg/dL — AB (ref 8.9–10.3)
CALCIUM: 8.5 mg/dL — AB (ref 8.9–10.3)
CO2: 27 mmol/L (ref 22–32)
CO2: 25 mmol/L (ref 22–32)
Chloride: 98 mmol/L (ref 98–111)
Chloride: 94 mmol/L — ABNORMAL LOW (ref 98–111)
Creatinine, Ser: 2.53 mg/dL — ABNORMAL HIGH (ref 0.44–1.00)
Creatinine, Ser: 3.14 mg/dL — ABNORMAL HIGH (ref 0.44–1.00)
GFR calc Af Amer: 20 mL/min — ABNORMAL LOW (ref >60)
GFR calc Af Amer: 15 mL/min — ABNORMAL LOW (ref >60)
GFR calc non Af Amer: 17 mL/min — ABNORMAL LOW (ref >60)
GFR calc non Af Amer: 13 mL/min — ABNORMAL LOW (ref >60)
Glucose, Bld: 110 mg/dL — ABNORMAL HIGH (ref 70–99)
Glucose, Bld: 206 mg/dL — ABNORMAL HIGH (ref 70–99)
Potassium: 2.7 mmol/L — CL (ref 3.5–5.1)
Potassium: 2.9 mmol/L — ABNORMAL LOW (ref 3.5–5.1)
Sodium: 135 mmol/L (ref 135–145)
Sodium: 131 mmol/L — ABNORMAL LOW (ref 135–145)

## 2018-07-22 LAB — GLUCOSE, CAPILLARY
Glucose-Capillary: 125 mg/dL — ABNORMAL HIGH (ref 70–99)
Glucose-Capillary: 156 mg/dL — ABNORMAL HIGH (ref 70–99)
Glucose-Capillary: 200 mg/dL — ABNORMAL HIGH (ref 70–99)
Glucose-Capillary: 363 mg/dL — ABNORMAL HIGH (ref 70–99)

## 2018-07-22 LAB — CBC
HCT: 29.1 % — ABNORMAL LOW (ref 36.0–46.0)
Hemoglobin: 9.1 g/dL — ABNORMAL LOW (ref 12.0–15.0)
MCH: 27.7 pg (ref 26.0–34.0)
MCHC: 31.3 g/dL (ref 30.0–36.0)
MCV: 88.7 fL (ref 80.0–100.0)
Platelets: 272 10*3/uL (ref 150–400)
RBC: 3.28 MIL/uL — ABNORMAL LOW (ref 3.87–5.11)
RDW: 19.4 % — ABNORMAL HIGH (ref 11.5–15.5)
WBC: 32.5 10*3/uL — ABNORMAL HIGH (ref 4.0–10.5)
nRBC: 0.1 % (ref 0.0–0.2)

## 2018-07-22 MED ORDER — POTASSIUM CHLORIDE CRYS ER 20 MEQ PO TBCR
20.0000 meq | EXTENDED_RELEASE_TABLET | Freq: Once | ORAL | Status: AC
  Administered 2018-07-22: 20 meq via ORAL
  Filled 2018-07-22: qty 1

## 2018-07-22 MED ORDER — POTASSIUM CHLORIDE 10 MEQ/100ML IV SOLN
10.0000 meq | INTRAVENOUS | Status: DC
  Filled 2018-07-22: qty 100

## 2018-07-22 NOTE — Progress Notes (Addendum)
   Subjective: Patient denies cough, SOB, body aches, pains, dysuria. Does have some diarrhea. Three episodes yesterday, none this morning. States she has an appetite.   Objective:  Vital signs in last 24 hours: Vitals:   07/22/18 0100 07/22/18 0605 07/22/18 0605 07/22/18 0815  BP:  (!) 164/54 (!) 164/54 (!) 138/55  Pulse:  74 73 88  Resp:  18 18 18   Temp: 98.6 F (37 C) 99.3 F (37.4 C) 99.3 F (37.4 C) 98.4 F (36.9 C)  TempSrc: Oral Oral Oral Oral  SpO2:  100% 100% 100%  Weight:       Physical Exam Gen: seen comfortably laying in bed, no distress CV: RRR, no murmurs Pulm: CTA bilaterally, no wheezes Ext: right LE +1 pitting edema, chronic ischemic changes of right great toe, left AKA, clean and dry incision  Assessment/Plan:  Principal Problem:   Fever Active Problems:   Diabetes mellitus type 1, uncontrolled, insulin dependent (HCC)   PAD (peripheral artery disease) (HCC)   Leukocytosis  Ms. Mooreisan 85 year old female with ESRD, complicated peripheral artery diseases/pleft AKA, diabetes,chronic diastolic CHF,andhypertension who presents withconfusionsigns of sepsis (fever, elevated lactic acid, and leukocytosis). She also has signs of fluid overload and elevated BNP consistent with acute on chronic diastolic heart failure.  Sepsis: Unclear sourceat this point.WBC 32 today. Tmax 100.3 overnight.Differential diagnosissoft tissue infection of chronic right lower extremity wound vsbacteremia 2/2 HD site (AVF doesn't have signs of infection). Foot xray negative for osteomyelitis. Right great toewith chronic ischemic changes, no surrounding cellulitis. VVS does not think right foot is the source of infection. Ultrasound of right lower extremity was negative for DVT but showed bilateral enlarged lymph nodes, cannot rule out malignancy. Can consider MRI of right foot to rule out osteomyelitis -Review blood smear  -Continuevancomycin and cefepime -Blood  culturesand urine culturesNGTD -Tylenol PRN fever and pain  Acute on chronic diastolic heart failure: -Continuous cardiac monitoring -Will hold off on diuretics in the setting of active infection  Hypokalemia: -2.7, orally replete with 20 meq  - recheck BMP  ?Abdominal aortic aneurysm:CXR showed aneurysm.Aorta ultrasound did not show abdominal aortic aneurysm   Diabetes:Takes Humalog 50-50 20 units qAM and 45 units qPM. - sliding scale tid w/ meals - CBG checks  Dispo: Anticipated discharge pending infectious workup   Glenda Craze, DO 07/22/2018, 8:32 AM Pager: [phone]

## 2018-07-22 NOTE — Progress Notes (Addendum)
[facility] Progress Note   Subjective:  Seen in room - feeling improved. VVS consulted, arteriogram and ABI studies pending. No CP/dyspnea.  Objective Vitals:   07/22/18 0100 07/22/18 0605 07/22/18 0605 07/22/18 0815  BP:  (!) 164/54 (!) 164/54 (!) 138/55  Pulse:  74 73 88  Resp:  18 18 18   Temp: 98.6 F (37 C) 99.3 F (37.4 C) 99.3 F (37.4 C) 98.4 F (36.9 C)  TempSrc: Oral Oral Oral Oral  SpO2:  100% 100% 100%  Weight:       Physical Exam General: Elderly woman, NAD Heart: RRR; no murmur Lungs: CTA anteriorly Abdomen: soft, non-tender Extremities: RLE without edema; dry gangrene foot changes. L AKA without stump edema. Dialysis Access: LUE AVF + bruit  Additional Objective Labs: Basic Metabolic Panel: Recent Labs  Lab 07/20/18 0522 07/21/18 0318 07/22/18 0414  NA 136 137 135  K 3.2* 4.3 2.7*  CL 100 102 98  CO2 28 26 27   GLUCOSE 123* 218* 110*  BUN 15 25* 13  CREATININE 2.47* 3.34* 2.53*  CALCIUM 7.9* 8.5* 8.3*  PHOS  --  2.6  --    Liver Function Tests: Recent Labs  Lab 07/19/18 1940 07/21/18 0318  AST 37  --   ALT 16  --   ALKPHOS 276*  --   BILITOT 0.6  --   PROT 7.1  --   ALBUMIN 1.8* 1.5*   CBC: Recent Labs  Lab 07/19/18 1940 07/20/18 0522 07/21/18 0318 07/22/18 0414  WBC 36.8* 35.2* 39.5* 32.5*  NEUTROABS 31.5*  --   --   --   HGB 9.7* 8.1* 8.1* 9.1*  HCT 32.7* 26.5* 26.1* 29.1*  MCV 92.4 89.8 91.3 88.7  PLT 346 295 273 272   Blood Culture    Component Value Date/Time   SDES URINE, CATHETERIZED 07/19/2018 2153   [place] NONE 07/19/2018 2153   CULT  07/19/2018 2153    NO GROWTH Performed at [facility] [address] [place], [place] [address]    REPTSTATUS 07/20/2018 FINAL 07/19/2018 2153   Studies/Results: US Aorta  Result Date: 07/21/2018 CLINICAL DATA:  Prior stroke in 2017.  Hyperlipidemia.  Diabetes. EXAM: ULTRASOUND OF ABDOMINAL AORTA TECHNIQUE: Ultrasound examination of the abdominal  aorta and proximal common iliac arteries was performed to evaluate for aneurysm. Additional color and Doppler images of the distal aorta were obtained to document patency. COMPARISON:  None. FINDINGS: Abdominal aortic measurements as follows: Proximal:  1.6 x 2 cm Mid:  1.5 x 1.4 cm Distal:  1 x 1.1 cm Patent: Yes, peak systolic velocity is 433.2 cm/s Right common iliac artery: 0.9 x 0.7 cm Left common iliac artery: Not visualized. IMPRESSION: 1. No abdominal aortic aneurysm. Electronically Signed   By: Kathreen Devoid   On: 07/21/2018 14:35   Dg Foot Complete Right  Result Date: 07/20/2018 CLINICAL DATA:  Sepsis possible right heel eschar EXAM: RIGHT FOOT COMPLETE - 3+ VIEW COMPARISON:  04/14/2018 FINDINGS: Generalized osteopenia. No acute fracture or dislocation. No aggressive osseous lesion. Small plantar calcaneal spur. Peripheral vascular atherosclerotic disease. Mild osteoarthritis of the first MTP joint. IMPRESSION: No acute osseous injury of the right foot. No osteomyelitis of the right foot. Electronically Signed   By: Kathreen Devoid   On: 07/20/2018 11:26   Vas [place] Lower Extremity Venous (dvt)  Result Date: 07/20/2018  Lower Venous Study Indications: Edema.  Risk Factors: Recent left AKA. ESRD on dialysis. Comparison Study: No prior study on file for  comparison. Performing Technologist: Sharion Dove RVS  Examination Guidelines: A complete evaluation includes B-mode imaging, spectral Doppler, color Doppler, and power Doppler as needed of all accessible portions of each vessel. Bilateral testing is considered an integral part of a complete examination. Limited examinations for reoccurring indications may be performed as noted.  Right Venous Findings: +---------+---------------+---------+-----------+----------+----------+          CompressibilityPhasicitySpontaneityPropertiesSummary    +---------+---------------+---------+-----------+----------+----------+ CFV      Full                                          pulsatile  +---------+---------------+---------+-----------+----------+----------+ SFJ      Full                                                    +---------+---------------+---------+-----------+----------+----------+ FV Prox  Full                                         pulsatile  +---------+---------------+---------+-----------+----------+----------+ FV Mid   Full                                                    +---------+---------------+---------+-----------+----------+----------+ FV DistalFull                                                    +---------+---------------+---------+-----------+----------+----------+ PFV      Full                                                    +---------+---------------+---------+-----------+----------+----------+ POP      Full                                                    +---------+---------------+---------+-----------+----------+----------+ PTV                                                   visualized +---------+---------------+---------+-----------+----------+----------+ PERO                                                  visualized +---------+---------------+---------+-----------+----------+----------+ GSV      Full                                                    +---------+---------------+---------+-----------+----------+----------+  Left Venous Findings: +---+---------------+---------+-----------+----------+---------+    CompressibilityPhasicitySpontaneityPropertiesSummary   +---+---------------+---------+-----------+----------+---------+ CFVFull                                         pulsatile +---+---------------+---------+-----------+----------+---------+    Summary: Right: There is no evidence of deep vein thrombosis in the lower extremity. Ultrasound characteristics of enlarged lymph nodes are noted in the groin. Left: No evidence of common femoral vein  obstruction. Ultrasound characteristics of enlarged lymph nodes noted in the groin.  *See table(s) above for measurements and observations. Electronically signed by Monica Martinez MD on 07/20/2018 at 12:03:26 PM.    Final    Medications: . ceFEPime (MAXIPIME) IV 1 g (07/21/18 2202)  . vancomycin Stopped (07/21/18 1157)   . aspirin EC  81 mg Oral Daily  . calcitRIOL  0.25 mcg Oral Q M,W,F-HD  . calcium acetate  667 mg Oral TID WC  . Chlorhexidine Gluconate Cloth  6 each Topical Q0600  . clopidogrel  75 mg Oral Daily  . [START ON 07/26/2018] darbepoetin (ARANESP) injection - DIALYSIS  150 mcg Intravenous Q Wed-HD  . donepezil  5 mg Oral QHS  . feeding supplement (NEPRO CARB STEADY)  237 mL Oral BID BM  . heparin  5,000 Units Subcutaneous Q8H  . insulin aspart  0-9 Units Subcutaneous TID WC  . multivitamin  1 tablet Oral QHS  . pantoprazole  20 mg Oral Daily  . polyethylene glycol  17 g Oral Daily  . pravastatin  40 mg Oral Daily  . sodium chloride flush  3 mL Intravenous Q12H    Dialysis Orders: MWF [facility] 4:15h, 180 dialyzer, 400/800, EDW 56kg, 2K/2Ca, AVF, heparin 5000 bolus - Venofer 100 q HD thru 3/30 - Mircera 145mcg IV q 2 weeks (last 3/18) - Calcitriol 0.33mcg PO q HD  Assessment/Plan: 1. Sepsis: Source unclear. Low grade fever and ^ WBC persist. CXR clear. VVS consulted for R foot gangrenous changes, studies pending but felt not to be source of infection. Per primary. On empiric Vanc/Cefepime. BCx and UCx 3/25 negative. 2. ESRD: Continue HD per MWF schedule - next 3/30. S/p K supp 3/27, likely will need ^K bath. 3. Hypertension/volume: BP decent, not quite to outpt EDW - will continue to push down. 4. Anemia: Hgb 9.1, trending up. Continue Aranesp q Wed. 5. Metabolic bone disease: Corr Ca/Phos ok. Continue calcitriol q HD. May need to hold binder if P stays low side. 6. Malnutrition: Alb very low, continue supplements. 7. Dementia: Worse from baseline, likely d/t  infection. 8. R foot ischemia: See above. VVS following, studies pending.  Veneta Penton, PA-C 07/22/2018, 8:37 AM  [facility] Pager: [phone]  I have seen and examined this patient and agree with plan and assessment in the above note with renal recommendations/intervention highlighted.  Continue with HD qMWF. [place] John A Kavian Peters,MD 07/22/2018 12:04 PM

## 2018-07-23 DIAGNOSIS — K649 Unspecified hemorrhoids: Secondary | ICD-10-CM

## 2018-07-23 LAB — CBC
HCT: 27.4 % — ABNORMAL LOW (ref 36.0–46.0)
Hemoglobin: 8.4 g/dL — ABNORMAL LOW (ref 12.0–15.0)
MCH: 28 pg (ref 26.0–34.0)
MCHC: 30.7 g/dL (ref 30.0–36.0)
MCV: 91.3 fL (ref 80.0–100.0)
Platelets: 255 10*3/uL (ref 150–400)
RBC: 3 MIL/uL — ABNORMAL LOW (ref 3.87–5.11)
RDW: 19.3 % — AB (ref 11.5–15.5)
WBC: 29.1 10*3/uL — ABNORMAL HIGH (ref 4.0–10.5)
nRBC: 0 % (ref 0.0–0.2)

## 2018-07-23 LAB — BASIC METABOLIC PANEL
Anion gap: 12 (ref 5–15)
BUN: 24 mg/dL — ABNORMAL HIGH (ref 8–23)
CALCIUM: 8.6 mg/dL — AB (ref 8.9–10.3)
CO2: 24 mmol/L (ref 22–32)
Chloride: 99 mmol/L (ref 98–111)
Creatinine, Ser: 3.81 mg/dL — ABNORMAL HIGH (ref 0.44–1.00)
GFR calc Af Amer: 12 mL/min — ABNORMAL LOW (ref >60)
GFR, EST NON AFRICAN AMERICAN: 10 mL/min — AB (ref >60)
Glucose, Bld: 301 mg/dL — ABNORMAL HIGH (ref 70–99)
Potassium: 3.4 mmol/L — ABNORMAL LOW (ref 3.5–5.1)
Sodium: 135 mmol/L (ref 135–145)

## 2018-07-23 LAB — GLUCOSE, CAPILLARY
Glucose-Capillary: 264 mg/dL — ABNORMAL HIGH (ref 70–99)
Glucose-Capillary: 242 mg/dL — ABNORMAL HIGH (ref 70–99)
Glucose-Capillary: 228 mg/dL — ABNORMAL HIGH (ref 70–99)
Glucose-Capillary: 193 mg/dL — ABNORMAL HIGH (ref 70–99)

## 2018-07-23 MED ORDER — PRO-STAT SUGAR FREE PO LIQD
30.0000 mL | Freq: Two times a day (BID) | ORAL | Status: DC
  Administered 2018-07-23 – 2018-07-24 (×3): 30 mL via ORAL
  Filled 2018-07-23 (×3): qty 30

## 2018-07-23 NOTE — Progress Notes (Signed)
   Subjective: No overnight events. Patient reports feeling well this morning. She continues to have no appetite. Denies cough, SOB, chest pain or any other pain. States she has pain when defecating.   Objective:  Vital signs in last 24 hours: Vitals:   07/22/18 0815 07/22/18 1616 07/22/18 2054 07/23/18 0542  BP: (!) 138/55 (!) 151/46 (!) 139/40 (!) 163/65  Pulse: 88 78 82 76  Resp: 18 18 15  (!) 21  Temp: 98.4 F (36.9 C) 98.6 F (37 C) 98.7 F (37.1 C) 98.4 F (36.9 C)  TempSrc: Oral Oral Oral   SpO2: 100% 100% 100% 99%  Weight:   57.1 kg    Physical Exam:  Gen: seen comfortably lying in bed, no distress Ext: right LE pitting edema, right great toe wound unchanged, dry and chronic ischemic changes, left AKA clean and dry  Genital: multiple hemorrhoids, no sacral ulcer appreciated   Assessment/Plan:  Principal Problem:   Fever Active Problems:   Diabetes mellitus type 1, uncontrolled, insulin dependent (HCC)   PAD (peripheral artery disease) (HCC)   Leukocytosis  Ms. Mooreisan 85 year old female with ESRD, complicated peripheral artery diseases/pleft AKA, diabetes,chronic diastolic CHF,andhypertension who presents withconfusionsigns of sepsis (fever, elevated lactic acid, and leukocytosis). She also has signs of fluid overload and elevated BNP consistent with acute on chronic diastolic heart failure.  Sepsis: Unclear sourceat this point.WBC trended down,29. Afebrile overnight.Differential diagnosisstill includes (althought unlikely) soft tissue infection of chronic right lower extremity wound vsbacteremia 2/2 HD site. No sacral decubitus ulcer's appreciated on exam. Ultrasound of right lower extremity was negative for DVT but showed bilateral enlarged lymph nodes, cannot rule out malignancy, although also unlikely. At this point there is no source for infection, patient's mental status has returned to baseline and WBC trending down so will discontinue antibiotics  and monitor.  -Review blood smear -Disontinuevancomycin and cefepime -Blood culturesand urine culturesNGTD -Tylenol PRN fever and pain  Acute on chronic diastolic heart failure: -Continuous cardiac monitoring -Will hold off on diuretics in the setting of active infection  Hypokalemia: -3.4 -Daily BMP  Diabetes:Takes Humalog 50-50 20 units qAM and 45 units qPM. - sliding scale tid w/ meals - CBG checks  Dispo: Anticipated discharge in approximately 1-2 day(s).   Mike Craze, DO 07/23/2018, 8:42 AM Pager: [phone]

## 2018-07-23 NOTE — Progress Notes (Addendum)
[facility] Progress Note   Subjective:  Seen in room. No new complaints. No CP/dyspnea. R foot stable without acute pain.  Objective Vitals:   07/22/18 0815 07/22/18 1616 07/22/18 2054 07/23/18 0542  BP: (!) 138/55 (!) 151/46 (!) 139/40 (!) 163/65  Pulse: 88 78 82 76  Resp: 18 18 15  (!) 21  Temp: 98.4 F (36.9 C) 98.6 F (37 C) 98.7 F (37.1 C) 98.4 F (36.9 C)  TempSrc: Oral Oral Oral   SpO2: 100% 100% 100% 99%  Weight:   57.1 kg    Physical Exam General: Elderly woman, NAD Heart: RRR; no murmur Lungs: CTAB Abdomen: soft, non-tender Extremities: RLE in boot; dry gangrene changes to foot. L AKA without stump edema Dialysis Access: LUE AVF + bruit  Additional Objective Labs: Basic Metabolic Panel: Recent Labs  Lab 07/21/18 0318 07/22/18 0414 07/22/18 1300 07/23/18 0446  NA 137 135 131* 135  K 4.3 2.7* 2.9* 3.4*  CL 102 98 94* 99  CO2 26 27 25 24   GLUCOSE 218* 110* 206* 301*  BUN 25* 13 18 24*  CREATININE 3.34* 2.53* 3.14* 3.81*  CALCIUM 8.5* 8.3* 8.5* 8.6*  PHOS 2.6  --   --   --    Liver Function Tests: Recent Labs  Lab 07/19/18 1940 07/21/18 0318  AST 37  --   ALT 16  --   ALKPHOS 276*  --   BILITOT 0.6  --   PROT 7.1  --   ALBUMIN 1.8* 1.5*   CBC: Recent Labs  Lab 07/19/18 1940 07/20/18 0522 07/21/18 0318 07/22/18 0414 07/23/18 0446  WBC 36.8* 35.2* 39.5* 32.5* 29.1*  NEUTROABS 31.5*  --   --   --   --   HGB 9.7* 8.1* 8.1* 9.1* 8.4*  HCT 32.7* 26.5* 26.1* 29.1* 27.4*  MCV 92.4 89.8 91.3 88.7 91.3  PLT 346 295 273 272 255   CBG: Recent Labs  Lab 07/22/18 0639 07/22/18 1109 07/22/18 1616 07/22/18 2054 07/23/18 0721  GLUCAP 125* 156* 200* 363* 264*   Studies/Results: US Aorta  Result Date: 07/21/2018 CLINICAL DATA:  Prior stroke in 2017.  Hyperlipidemia.  Diabetes. EXAM: ULTRASOUND OF ABDOMINAL AORTA TECHNIQUE: Ultrasound examination of the abdominal aorta and proximal common iliac arteries was performed to  evaluate for aneurysm. Additional color and Doppler images of the distal aorta were obtained to document patency. COMPARISON:  None. FINDINGS: Abdominal aortic measurements as follows: Proximal:  1.6 x 2 cm Mid:  1.5 x 1.4 cm Distal:  1 x 1.1 cm Patent: Yes, peak systolic velocity is 235.3 cm/s Right common iliac artery: 0.9 x 0.7 cm Left common iliac artery: Not visualized. IMPRESSION: 1. No abdominal aortic aneurysm. Electronically Signed   By: Kathreen Devoid   On: 07/21/2018 14:35   Medications:  . aspirin EC  81 mg Oral Daily  . calcitRIOL  0.25 mcg Oral Q M,W,F-HD  . calcium acetate  667 mg Oral TID WC  . Chlorhexidine Gluconate Cloth  6 each Topical Q0600  . clopidogrel  75 mg Oral Daily  . [START ON 07/26/2018] darbepoetin (ARANESP) injection - DIALYSIS  150 mcg Intravenous Q Wed-HD  . donepezil  5 mg Oral QHS  . feeding supplement (NEPRO CARB STEADY)  237 mL Oral BID BM  . heparin  5,000 Units Subcutaneous Q8H  . insulin aspart  0-9 Units Subcutaneous TID WC  . multivitamin  1 tablet Oral QHS  . pantoprazole  20 mg Oral Daily  . polyethylene glycol  17 g Oral Daily  . pravastatin  40 mg Oral Daily  . sodium chloride flush  3 mL Intravenous Q12H    Dialysis Orders: MWF [facility] 4:15h, 180 dialyzer, 400/800, EDW 56kg, 2K/2Ca, AVF, heparin 5000 bolus - Venofer 100 q HD thru 3/30 - Mircera 115mcg IV q 2 weeks (last 3/18) - Calcitriol 0.21mcg PO q HD  Assessment/Plan: 1. Sepsis: Source unclear. Low grade fever and ^ WBC persist. CXR clear. VVS consulted for R foot gangrenous changes, studies pending but felt not to be source of infection. Per primary. On empiric Vanc/Cefepime. BCx and UCx 3/25 negative. 2. ESRD: Continue HD per MWF schedule - next 3/30. Has been getting K supps daily, 3K bath w/ HD. 3. Hypertension/volume: BP higher, not quite to outpt EDW - will continue to push down. 4. Anemia: Hgb 8.4. Continue Aranesp 111mcg q Wed. 5. Metabolic bone disease: Corr Ca/Phos ok. Continue  calcitriol q HD. May need to hold binder if P stays low side. 6. Malnutrition: Alb very low, continue supplements. 7. Dementia: Worse from baseline, likely d/t infection. 8. R foot ischemia: See above. VVS following, studies pending. 9. T2DM: SSI, per primary.  Glenda Penton, PA-C 07/23/2018, 8:52 AM  [facility] Pager: [phone]  I have seen and examined this patient and agree with plan and assessment in the above note with renal recommendations/intervention highlighted.  Still no source of infection identified.  She is at her baseline.  Cont to follow cultures.  Disposition per primary svc.  Afebrile overnight. Glenda John A Lavra Imler,MD 07/23/2018 11:51 AM

## 2018-07-23 NOTE — Progress Notes (Signed)
   VASCULAR SURGERY ASSESSMENT & PLAN:   POD 23 Left AKA: Her amputation site is healing nicely.  Staples stay in another week.  GANGRENOUS CHANGES RIGHT FOOT: This patient has a heel decubitus on the right and some gangrenous changes to the first and fifth toes.  The patient has undergone a tibial intervention by Dr. Carlis Abbott with an excellent result.  She has biphasic flow in her anterior tibial artery.  I do not think there are any further options for revascularization on the right.  I do not think the wounds currently are a source of sepsis.  Her duplex scan on 06/20/2018 showed biphasic signals throughout the anterior tibial artery.  Her peroneal and posterior tibial arteries are occluded.  SUBJECTIVE:   No specific complaints this morning.  PHYSICAL EXAM:   Vitals:   07/22/18 0815 07/22/18 1616 07/22/18 2054 07/23/18 0542  BP: (!) 138/55 (!) 151/46 (!) 139/40 (!) 163/65  Pulse: 88 78 82 76  Resp: 18 18 15  (!) 21  Temp: 98.4 F (36.9 C) 98.6 F (37 C) 98.7 F (37.1 C) 98.4 F (36.9 C)  TempSrc: Oral Oral Oral   SpO2: 100% 100% 100% 99%  Weight:   57.1 kg    The patient has a biphasic distal anterior tibial signal with the Doppler.  She has a monophasic dorsalis pedis signal. Her left above-the-knee amputation site is healing nicely. The gangrenous changes on her right foot are unchanged.  LABS:   Lab Results  Component Value Date   WBC 29.1 (H) 07/23/2018   HGB 8.4 (L) 07/23/2018   HCT 27.4 (L) 07/23/2018   MCV 91.3 07/23/2018   PLT 255 07/23/2018   Lab Results  Component Value Date   CREATININE 3.81 (H) 07/23/2018   Lab Results  Component Value Date   INR 0.96 04/28/2018   CBG (last 3)  Recent Labs    07/22/18 1616 07/22/18 2054 07/23/18 0721  GLUCAP 200* 363* 264*    PROBLEM LIST:    Principal Problem:   Fever Active Problems:   Diabetes mellitus type 1, uncontrolled, insulin dependent (HCC)   PAD (peripheral artery disease) (HCC)   Leukocytosis   CURRENT MEDS:   . aspirin EC  81 mg Oral Daily  . calcitRIOL  0.25 mcg Oral Q M,W,F-HD  . calcium acetate  667 mg Oral TID WC  . Chlorhexidine Gluconate Cloth  6 each Topical Q0600  . clopidogrel  75 mg Oral Daily  . [START ON 07/26/2018] darbepoetin (ARANESP) injection - DIALYSIS  150 mcg Intravenous Q Wed-HD  . donepezil  5 mg Oral QHS  . feeding supplement (NEPRO CARB STEADY)  237 mL Oral BID BM  . feeding supplement (PRO-STAT SUGAR FREE 64)  30 mL Oral BID  . heparin  5,000 Units Subcutaneous Q8H  . insulin aspart  0-9 Units Subcutaneous TID WC  . multivitamin  1 tablet Oral QHS  . pantoprazole  20 mg Oral Daily  . polyethylene glycol  17 g Oral Daily  . pravastatin  40 mg Oral Daily  . sodium chloride flush  3 mL Intravenous Q12H    [name] Beeper: [phone] Office: [phone] 07/23/2018

## 2018-07-23 NOTE — Progress Notes (Signed)
Internal Medicine Attending:   I saw and examined the patient. I reviewed the resident's note and I agree with the resident's findings and plan as documented in the resident's note. Leukocytosis still present, fever appears resolved, no clear sign of source of infection at this time, blood cultures remain negative, agree with D/C of empiric antibiotics and monitoring.

## 2018-07-24 DIAGNOSIS — I480 Paroxysmal atrial fibrillation: Secondary | ICD-10-CM

## 2018-07-24 DIAGNOSIS — L97419 Non-pressure chronic ulcer of right heel and midfoot with unspecified severity: Secondary | ICD-10-CM

## 2018-07-24 LAB — BASIC METABOLIC PANEL
Anion gap: 14 (ref 5–15)
BUN: 36 mg/dL — ABNORMAL HIGH (ref 8–23)
CO2: 23 mmol/L (ref 22–32)
Calcium: 8.7 mg/dL — ABNORMAL LOW (ref 8.9–10.3)
Chloride: 97 mmol/L — ABNORMAL LOW (ref 98–111)
Creatinine, Ser: 4.47 mg/dL — ABNORMAL HIGH (ref 0.44–1.00)
GFR calc Af Amer: 10 mL/min — ABNORMAL LOW (ref >60)
GFR calc non Af Amer: 9 mL/min — ABNORMAL LOW (ref >60)
Glucose, Bld: 198 mg/dL — ABNORMAL HIGH (ref 70–99)
Potassium: 3.2 mmol/L — ABNORMAL LOW (ref 3.5–5.1)
Sodium: 134 mmol/L — ABNORMAL LOW (ref 135–145)

## 2018-07-24 LAB — CBC
HCT: 28.1 % — ABNORMAL LOW (ref 36.0–46.0)
HCT: 26.6 % — ABNORMAL LOW (ref 36.0–46.0)
Hemoglobin: 8.5 g/dL — ABNORMAL LOW (ref 12.0–15.0)
Hemoglobin: 8 g/dL — ABNORMAL LOW (ref 12.0–15.0)
MCH: 26.9 pg (ref 26.0–34.0)
MCH: 26.7 pg (ref 26.0–34.0)
MCHC: 30.2 g/dL (ref 30.0–36.0)
MCHC: 30.1 g/dL (ref 30.0–36.0)
MCV: 88.9 fL (ref 80.0–100.0)
MCV: 88.7 fL (ref 80.0–100.0)
Platelets: 260 10*3/uL (ref 150–400)
Platelets: 269 10*3/uL (ref 150–400)
RBC: 3.16 MIL/uL — AB (ref 3.87–5.11)
RBC: 3 MIL/uL — ABNORMAL LOW (ref 3.87–5.11)
RDW: 18.9 % — ABNORMAL HIGH (ref 11.5–15.5)
RDW: 18.9 % — ABNORMAL HIGH (ref 11.5–15.5)
WBC: 25.4 10*3/uL — ABNORMAL HIGH (ref 4.0–10.5)
WBC: 26.5 10*3/uL — ABNORMAL HIGH (ref 4.0–10.5)
nRBC: 0 % (ref 0.0–0.2)
nRBC: 0 % (ref 0.0–0.2)

## 2018-07-24 LAB — RENAL FUNCTION PANEL
Albumin: 1.4 g/dL — ABNORMAL LOW (ref 3.5–5.0)
Anion gap: 13 (ref 5–15)
BUN: 35 mg/dL — ABNORMAL HIGH (ref 8–23)
CO2: 24 mmol/L (ref 22–32)
Calcium: 8.6 mg/dL — ABNORMAL LOW (ref 8.9–10.3)
Chloride: 98 mmol/L (ref 98–111)
Creatinine, Ser: 4.49 mg/dL — ABNORMAL HIGH (ref 0.44–1.00)
GFR calc Af Amer: 10 mL/min — ABNORMAL LOW (ref >60)
GFR calc non Af Amer: 9 mL/min — ABNORMAL LOW (ref >60)
Glucose, Bld: 193 mg/dL — ABNORMAL HIGH (ref 70–99)
Phosphorus: 2.1 mg/dL — ABNORMAL LOW (ref 2.5–4.6)
Potassium: 3.2 mmol/L — ABNORMAL LOW (ref 3.5–5.1)
Sodium: 135 mmol/L (ref 135–145)

## 2018-07-24 LAB — CULTURE, BLOOD (ROUTINE X 2)
Culture: NO GROWTH
Culture: NO GROWTH
Special Requests: ADEQUATE
Special Requests: ADEQUATE

## 2018-07-24 LAB — GLUCOSE, CAPILLARY
Glucose-Capillary: 139 mg/dL — ABNORMAL HIGH (ref 70–99)
Glucose-Capillary: 104 mg/dL — ABNORMAL HIGH (ref 70–99)

## 2018-07-24 MED ORDER — SODIUM CHLORIDE 0.9 % IV SOLN: 100.0000 mL | INTRAVENOUS | Status: DC | PRN

## 2018-07-24 MED ORDER — LIDOCAINE HCL (PF) 1 % IJ SOLN: 5.0000 mL | INTRAMUSCULAR | Status: DC | PRN

## 2018-07-24 MED ORDER — CALCITRIOL 0.25 MCG PO CAPS
ORAL_CAPSULE | ORAL | Status: AC
  Filled 2018-07-24: qty 1

## 2018-07-24 MED ORDER — METOPROLOL TARTRATE 5 MG/5ML IV SOLN: 5.0000 mg | Freq: Once | INTRAVENOUS | Status: DC

## 2018-07-24 MED ORDER — LIDOCAINE-PRILOCAINE 2.5-2.5 % EX CREA: 1.0000 "application " | TOPICAL_CREAM | CUTANEOUS | Status: DC | PRN

## 2018-07-24 MED ORDER — HEPARIN SODIUM (PORCINE) 1000 UNIT/ML DIALYSIS
20.0000 [IU]/kg | INTRAMUSCULAR | Status: DC | PRN
  Administered 2018-07-24: 1200 [IU] via INTRAVENOUS_CENTRAL

## 2018-07-24 MED ORDER — ACETAMINOPHEN 325 MG PO TABS
ORAL_TABLET | ORAL | Status: AC
  Filled 2018-07-24: qty 2

## 2018-07-24 MED ORDER — PENTAFLUOROPROP-TETRAFLUOROETH EX AERO: 1.0000 "application " | INHALATION_SPRAY | CUTANEOUS | Status: DC | PRN

## 2018-07-24 MED ORDER — HEPARIN SODIUM (PORCINE) 1000 UNIT/ML DIALYSIS: 20.0000 [IU]/kg | INTRAMUSCULAR | Status: DC | PRN

## 2018-07-24 MED ORDER — HEPARIN SODIUM (PORCINE) 1000 UNIT/ML IJ SOLN
INTRAMUSCULAR | Status: AC
  Administered 2018-07-24: 1200 [IU] via INTRAVENOUS_CENTRAL
  Filled 2018-07-24: qty 2

## 2018-07-24 MED ORDER — HEPARIN SODIUM (PORCINE) 1000 UNIT/ML DIALYSIS: 1000.0000 [IU] | INTRAMUSCULAR | Status: DC | PRN

## 2018-07-24 MED ORDER — METOPROLOL SUCCINATE ER 25 MG PO TB24: 12.5000 mg | ORAL_TABLET | ORAL | 0 refills | Status: AC

## 2018-07-24 MED ORDER — ALTEPLASE 2 MG IJ SOLR: 2.0000 mg | Freq: Once | INTRAMUSCULAR | Status: DC | PRN

## 2018-07-24 NOTE — Procedures (Signed)
   I was present at this dialysis session, have reviewed the session itself and made  appropriate changes Kelly Splinter MD [place] pager [phone]   07/24/2018, 8:44 AM

## 2018-07-24 NOTE — Discharge Summary (Signed)
Name: Glenda [name] MRN: [id number] DOB: 1933-12-05 85 y.o. PCP: Angelica Pou, MD  Date of Admission: 07/19/2018  7:15 PM Date of Discharge: 07/24/2018 Attending Physician: Joni Reining DO  Discharge Diagnosis: 1. Sepsis  2. Paroxysmal Atrial Fibrillation 3. Peripheral artery disease 4. Acute on chronic diastolic heart failure  5. Diabetes   Discharge Medications: Allergies as of 07/24/2018   No Known Allergies     Medication List    TAKE these medications   acetaminophen 500 MG tablet Commonly known as:  TYLENOL Take 1,000 mg by mouth at bedtime as needed for headache (pain).   aspirin 81 MG EC tablet Take 1 tablet (81 mg total) by mouth every evening. What changed:  when to take this   calcium acetate 667 MG capsule Commonly known as:  PHOSLO Take 667 mg by mouth 3 (three) times daily with meals.   citalopram 20 MG tablet Commonly known as:  CELEXA Take 20 mg by mouth daily.   clopidogrel 75 MG tablet Commonly known as:  PLAVIX Take 1 tablet (75 mg total) by mouth daily.   donepezil 5 MG tablet Commonly known as:  ARICEPT Take 5 mg by mouth at bedtime.   EMLA cream Generic drug:  lidocaine-prilocaine Apply 1 application topically See admin instructions. Apply topically prior to dialysis on Monday, Wednesday, Friday   feeding supplement (GLUCERNA SHAKE) Liqd Take 237 mLs by mouth daily.   feeding supplement (PRO-STAT SUGAR FREE 64) Liqd Take 30 mLs by mouth 3 (three) times daily with meals.   HYDROmorphone 2 MG tablet Commonly known as:  Dilaudid Take 1 tablet (2 mg total) by mouth every 4 (four) hours as needed for moderate pain or severe pain.   metoprolol succinate 25 MG 24 hr tablet Commonly known as:  TOPROL-XL Take 0.5 tablets (12.5 mg total) by mouth See admin instructions. Take on non hemodialysis days- Tuesday, Thursday, Saturday and Sunday   multivitamin Tabs tablet Take 1 tablet by mouth daily.   pantoprazole 20 MG  tablet Commonly known as:  PROTONIX Take 20 mg by mouth daily.   polyethylene glycol packet Commonly known as:  MIRALAX / GLYCOLAX Take 17 g by mouth daily. Mix in beverage of choice and drink   pravastatin 40 MG tablet Commonly known as:  PRAVACHOL Take 40 mg by mouth daily.   Senna S 8.6-50 MG tablet Generic drug:  senna-docusate Take 2 tablets by mouth at bedtime as needed (to maintain comfortable bowel patterns).       Disposition and follow-up:   [name] was discharged from [facility] in Stable condition.  At the hospital follow up visit please address:  1.  Acute on chronic HF- please reassess if patient needs to resume imdur. Discontinued at last hospital discharge   Diabetes- patient had multiple hypoglycemic events during her last hospitalization and insulin was held at discharge. Please determine if patient needs to resume insulin regimen.  2.  Labs / imaging needed at time of follow-up: cbc, bmp  3.  Pending labs/ test needing follow-up: none  Follow-up Appointments: Patient needs to follow up with PCP in 1 week.   Hospital Course by problem list: 1. Sepsis -patient presented with confusion and signs of sepsis (fever, elevated lactic acid, and leukocytosis). She also has signs of fluid overload and elevated BNP consistent with acute on chronic diastolic heart failure.  White blood cell count peaked at 39, and trended down to 25-day of discharge.  Differential diagnosis included soft  tissue infection of chronic right lower extremity wound vsbacteremia 2/2 HD site. No sacral decubitus ulcer's appreciated on exam. Ultrasound of right lower extremity was negative for DVT but showed bilateral enlarged lymph nodes.  Smear was reviewed and appeared infectious and not malignant in nature.  Patient's mental status improved for hospital course and white blood cell count trended down.  She was treated with IV vancomycin and cefepime for days.  Blood  and urine cultures showed no growth.  Recommend rechecking CBC at hospital follow-up. 2. Paroxysmal Atrial Fibrillation- in RVR.  Most likely triggered by hypo-kalemia and HD.  Patient was hypokalemic at 2.7 and treated with 20 mEq oral potassium x2.  She returned to sinus rhythm after her hemodialysis and was restarted on her metoprolol 12.5 mg daily.  This was held at her last hospitalization due to hypotension.  She was discharged to continue metoprolol 12.5 mg on non-hemodialysis days. 3. Peripheral artery disease-patient recently had a left AKA which did not look infected, incision site was clean and dry.  She did have a chronic right great toe wound and right heel wound.  Vascular was consulted and did not think that this was the source of sepsis.  Also stated no further revascularization options after previous AT atherectomy.  She had her left AKA staples removed since she was 3 weeks postop. 4. Acute on chronic diastolic heart failure-stable.  Diuretics were held in the setting of active infection.  Her imdur was held at last hospital discharge, please reassess and see if this needs to be resumed.  5. Diabetes- stable.  Managed with sliding scale 3 times daily with meals.    Discharge Vitals:   BP (!) 127/47 (BP Location: Right Arm)    Pulse 78    Temp 98.2 F (36.8 C) (Oral)    Resp 18    Wt 57.2 kg    SpO2 100%    BMI 20.35 kg/m   Pertinent Labs, Studies, and Procedures:   CBC Latest Ref Rng & Units 07/24/2018 07/24/2018 07/23/2018  WBC 4.0 - 10.5 K/uL 26.5(H) 25.4(H) 29.1(H)  Hemoglobin 12.0 - 15.0 g/dL 8.0(L) 8.5(L) 8.4(L)  Hematocrit 36.0 - 46.0 % 26.6(L) 28.1(L) 27.4(L)  Platelets 150 - 400 K/uL 269 260 255   CMP Latest Ref Rng & Units 07/24/2018 07/24/2018 07/23/2018  Glucose 70 - 99 mg/dL 193(H) 198(H) 301(H)  BUN 8 - 23 mg/dL 35(H) 36(H) 24(H)  Creatinine 0.44 - 1.00 mg/dL 4.49(H) 4.47(H) 3.81(H)  Sodium 135 - 145 mmol/L 135 134(L) 135  Potassium 3.5 - 5.1 mmol/L 3.2(L) 3.2(L)  3.4(L)  Chloride 98 - 111 mmol/L 98 97(L) 99  CO2 22 - 32 mmol/L 24 23 24   Calcium 8.9 - 10.3 mg/dL 8.6(L) 8.7(L) 8.6(L)  Total Protein 6.5 - 8.1 g/dL - - -  Total Bilirubin 0.3 - 1.2 mg/dL - - -  Alkaline Phos 38 - 126 U/L - - -  AST 15 - 41 U/L - - -  ALT 0 - 44 U/L - - -   Dg Chest 2 View  Result Date: 07/19/2018 CLINICAL DATA:  85 year old female fever and altered mental status. EXAM: CHEST - 2 VIEW COMPARISON:  01/06/2018 and earlier. FINDINGS: Chronic elevation of the right hemidiaphragm is stable. Mediastinal contours remain within normal limits. Visualized tracheal air column is within normal limits. Mild chronic increased interstitial markings are stable. No pneumothorax, pulmonary edema, pleural effusion or acute pulmonary opacity. Negative visible bowel gas pattern. No acute osseous abnormality identified. Questionable aneurysmal enlargement  of abdominal aortic calcifications in the abdomen on the lateral view. IMPRESSION: 1. No acute cardiopulmonary abnormality. 2. Questionable abdominal aortic aneurysm judging from vascular calcifications on the lateral view. Consider Aorta Ultrasound to evaluate further. Electronically Signed   By: Genevie Ann M.D.   On: 07/19/2018 20:55   Ct Head Wo Contrast  Result Date: 07/19/2018 CLINICAL DATA:  Altered mental status. EXAM: CT HEAD WITHOUT CONTRAST TECHNIQUE: Contiguous axial images were obtained from the base of the skull through the vertex without intravenous contrast. COMPARISON:  Head CT 06/13/2016 FINDINGS: Brain: No intracranial hemorrhage, mass effect, or midline shift. Unchanged degree of atrophy and chronic small vessel ischemia. Remote lacunar infarct in the right basal ganglia and thalamus5. Remote infarct in the left pons. Small remote right cerebellar infarct. No hydrocephalus. The basilar cisterns are patent. No evidence of territorial infarct or acute ischemia. No extra-axial or intracranial fluid collection. Vascular: Atherosclerosis of  skullbase vasculature without hyperdense vessel or abnormal calcification. Skull: No fracture or focal lesion. Sinuses/Orbits: Mild motion artifact. No acute findings. Other: None. IMPRESSION: 1. No acute intracranial abnormality. 2. Unchanged atrophy, chronic small vessel ischemia and remote lacunar infarcts. Electronically Signed   By: Keith Rake M.D.   On: 07/19/2018 20:44   Dg Foot Complete Right  Result Date: 07/20/2018 CLINICAL DATA:  Sepsis possible right heel eschar EXAM: RIGHT FOOT COMPLETE - 3+ VIEW COMPARISON:  04/14/2018 FINDINGS: Generalized osteopenia. No acute fracture or dislocation. No aggressive osseous lesion. Small plantar calcaneal spur. Peripheral vascular atherosclerotic disease. Mild osteoarthritis of the first MTP joint. IMPRESSION: No acute osseous injury of the right foot. No osteomyelitis of the right foot. Electronically Signed   By: Kathreen Devoid   On: 07/20/2018 11:26   Vas [place] Lower Extremity Venous (dvt)  Result Date: 07/20/2018  Lower Venous Study Indications: Edema.  Risk Factors: Recent left AKA. ESRD on dialysis. Comparison Study: No prior study on file for comparison. Performing Technologist: Sharion Dove RVS  Examination Guidelines: A complete evaluation includes B-mode imaging, spectral Doppler, color Doppler, and power Doppler as needed of all accessible portions of each vessel. Bilateral testing is considered an integral part of a complete examination. Limited examinations for reoccurring indications may be performed as noted.  Right Venous Findings: +---------+---------------+---------+-----------+----------+----------+            Compressibility Phasicity Spontaneity Properties Summary     +---------+---------------+---------+-----------+----------+----------+  CFV       Full                                             pulsatile   +---------+---------------+---------+-----------+----------+----------+  SFJ       Full                                                          +---------+---------------+---------+-----------+----------+----------+  FV Prox   Full                                             pulsatile   +---------+---------------+---------+-----------+----------+----------+  FV Mid    Full                                                         +---------+---------------+---------+-----------+----------+----------+  FV Distal Full                                                         +---------+---------------+---------+-----------+----------+----------+  PFV       Full                                                         +---------+---------------+---------+-----------+----------+----------+  POP       Full                                                         +---------+---------------+---------+-----------+----------+----------+  PTV                                                        visualized  +---------+---------------+---------+-----------+----------+----------+  PERO                                                       visualized  +---------+---------------+---------+-----------+----------+----------+  GSV       Full                                                         +---------+---------------+---------+-----------+----------+----------+  Left Venous Findings: +---+---------------+---------+-----------+----------+---------+      Compressibility Phasicity Spontaneity Properties Summary    +---+---------------+---------+-----------+----------+---------+  CFV Full                                             pulsatile  +---+---------------+---------+-----------+----------+---------+    Summary: Right: There is no evidence of deep vein thrombosis in the lower extremity. Ultrasound characteristics of enlarged lymph nodes are noted in the groin. Left: No evidence of common femoral vein obstruction. Ultrasound characteristics of enlarged lymph nodes noted in the groin.  *See table(s) above for measurements and observations. Electronically  signed by Monica Martinez MD on 07/20/2018 at 12:03:26 PM.    Final   Discharge Instructions: Discharge Instructions    Diet - low sodium heart healthy   Complete by:  As directed    Diet - low sodium heart healthy   Complete by:  As directed    Discharge instructions   Complete by:  As directed    [name],  You were hospitalized due to sepsis. We are not sure what the source of your infection was. Your infectious workup was negative. Vascular surgery evaluated your left AKA  and right foot wounds which both looked good. You were treated with IV antibiotics and improved.   I want you to continue taking all of your medications as prescribed. I want you to start taking Toprol-xl 12.5 mg on non-hemodialysis days. So take 12.5 mg on Tuesday, Thursday, Saturday and Sunday. Please follow up with your PCP in one week.   Thank you for allowing [place] to be a part of your care!   Increase activity slowly   Complete by:  As directed    Increase activity slowly   Complete by:  As directed       Signed: Bran Aldridge N, DO 07/25/2018, 2:20 PM

## 2018-07-24 NOTE — Progress Notes (Signed)
Internal Medicine Attending:   I saw and examined the patient. I reviewed the resident's note and I agree with the resident's findings and plan as documented in the resident's note. Patient was seen while on HD with Dr Laural Golden, heart rate tachycardic and irregular> patient back in Afib with some variable RVR, as noted will resume previous metoprolol for rate control.  Plan to monitor her overnight with possible discharge tomorrow.

## 2018-07-24 NOTE — Progress Notes (Addendum)
[facility] Progress Note   Dialysis Orders: MWF [facility] 4:15h, 180 dialyzer, 400/800, EDW 56kg, 2K/2Ca, AVF, heparin 5000 bolus - Venofer 100 q HD thru 3/30 - Mircera 115mcg IV q 2 weeks (last 3/18) - Calcitriol 0.51mcg PO q HD  Heparin 5000 venofer 100 through 3/30 Mircera 150 last given 3/18 Calcitrol 0.25 just lowered due to decline in iPTH Recent labs: hgb 7.6 3/18 15% sat iPTH 87  Assessment/Plan: 1. Sepsis: Source unclear. Low grade fever and ^ WBC persist. CXR clear. VVS consulted for R foot gangrenous changes, studies pending but felt not to be source of infection. Per primary. On empiric Vanc/Cefepime. BCx pending and UCx 3/25 negative. Afebrile. 2. ESRD: Continue HD per MWF Has been getting K supps K  3.2 - needs 4 K bath today- have changed from 3 K 3. Hypertension/volume: BP higher, not quite to outpt EDW -dropped pressure during treatment with relex tachycardia - only 1.1 above EDW by bed weight - goal may have been to generous today - keeping out of UF the rest of treatment 4. Anemia: Hgb 8.. Continue Aranesp 197mcg q Wed. 5. Metabolic bone disease:  Continue calcitriol q HD. P low  - stop binder 6. Malnutrition: Alb very low, continue supplements. 7. Dementia: Worse from baseline, likely d/t infection. 8. R foot ischemia: See above. VVS following, studies pending. 9. T2DM: SSI, per primary.  Myriam Jacobson, PA-C [place] 07/24/2018,9:27 AM  LOS: 5 days   Pt seen, examined and agree w A/P as above.  [facility] 07/24/2018, 5:13 PM    Subjective:   NAD.  Denies any problems. BP drop with reflex tachycardia.  Objective Vitals:   07/23/18 0946 07/23/18 1757 07/23/18 2113 07/24/18 0443  BP: (!) 164/45 (!) 155/51 (!) 140/42 (!) 160/63  Pulse: 71 70 73 83  Resp: 18 18 16 15   Temp:  98 F (36.7 C) 98.6 F (37 C) 97.9 F (36.6 C)  TempSrc:  Oral Oral   SpO2: 100% 100% 100% 100%  Weight:        Physical Exam General: MAD supine on HD Heart:tachy reg with ectopy Lungs: clear anteriorly Abdomen: soft NT Extremities: left AKA healing well no edeam, right LE dry gangrene changes to foot Dialysis Access: left AVF Qb 300  Additional Objective Labs: Basic Metabolic Panel: Recent Labs  Lab 07/21/18 0318  07/23/18 0446 07/24/18 0555 07/24/18 0723  NA 137   < > 135 134* 135  K 4.3   < > 3.4* 3.2* 3.2*  CL 102   < > 99 97* 98  CO2 26   < > 24 23 24   GLUCOSE 218*   < > 301* 198* 193*  BUN 25*   < > 24* 36* 35*  CREATININE 3.34*   < > 3.81* 4.47* 4.49*  CALCIUM 8.5*   < > 8.6* 8.7* 8.6*  PHOS 2.6  --   --   --  2.1*   < > = values in this interval not displayed.   Liver Function Tests: Recent Labs  Lab 07/19/18 1940 07/21/18 0318 07/24/18 0723  AST 37  --   --   ALT 16  --   --   ALKPHOS 276*  --   --   BILITOT 0.6  --   --   PROT 7.1  --   --   ALBUMIN 1.8* 1.5* 1.4*   No results for input(s): LIPASE, AMYLASE in the last 168 hours.  CBC: Recent Labs  Lab 07/19/18 1940  07/21/18 0318 07/22/18 0414 07/23/18 0446 07/24/18 0555 07/24/18 0723  WBC 36.8*   < > 39.5* 32.5* 29.1* 25.4* 26.5*  NEUTROABS 31.5*  --   --   --   --   --   --   HGB 9.7*   < > 8.1* 9.1* 8.4* 8.5* 8.0*  HCT 32.7*   < > 26.1* 29.1* 27.4* 28.1* 26.6*  MCV 92.4   < > 91.3 88.7 91.3 88.9 88.7  PLT 346   < > 273 272 255 260 269   < > = values in this interval not displayed.   Blood Culture    Component Value Date/Time   SDES URINE, CATHETERIZED 07/19/2018 2153   [place] NONE 07/19/2018 2153   CULT  07/19/2018 2153    NO GROWTH Performed at [facility] [address] [place], [place] [id number]    REPTSTATUS 07/20/2018 FINAL 07/19/2018 2153    Cardiac Enzymes: No results for input(s): CKTOTAL, CKMB, CKMBINDEX, TROPONINI in the last 168 hours. CBG: Recent Labs  Lab 07/23/18 0721 07/23/18 1133 07/23/18 1612 07/23/18 2113 07/24/18 0821  GLUCAP 264* 242* 228* 193*  139*   Iron Studies: No results for input(s): IRON, TIBC, TRANSFERRIN, FERRITIN in the last 72 hours. Lab Results  Component Value Date   INR 0.96 04/28/2018   INR 0.99 04/28/2018   INR 1.00 10/12/2017   Studies/Results: No results found. Medications: . sodium chloride    . sodium chloride     . aspirin EC  81 mg Oral Daily  . calcitRIOL  0.25 mcg Oral Q M,W,F-HD  . Chlorhexidine Gluconate Cloth  6 each Topical Q0600  . clopidogrel  75 mg Oral Daily  . [START ON 07/26/2018] darbepoetin (ARANESP) injection - DIALYSIS  150 mcg Intravenous Q Wed-HD  . donepezil  5 mg Oral QHS  . feeding supplement (NEPRO CARB STEADY)  237 mL Oral BID BM  . feeding supplement (PRO-STAT SUGAR FREE 64)  30 mL Oral BID  . heparin  5,000 Units Subcutaneous Q8H  . insulin aspart  0-9 Units Subcutaneous TID WC  . metoprolol tartrate  5 mg Intravenous Once  . multivitamin  1 tablet Oral QHS  . pantoprazole  20 mg Oral Daily  . polyethylene glycol  17 g Oral Daily  . pravastatin  40 mg Oral Daily  . sodium chloride flush  3 mL Intravenous Q12H

## 2018-07-24 NOTE — Progress Notes (Addendum)
   Subjective: No overnight events. Patient was seen during HD today. She reported feeling well. No complaints.  Objective:  Vital signs in last 24 hours: Vitals:   07/23/18 0946 07/23/18 1757 07/23/18 2113 07/24/18 0443  BP: (!) 164/45 (!) 155/51 (!) 140/42 (!) 160/63  Pulse: 71 70 73 83  Resp: 18 18 16 15   Temp:  98 F (36.7 C) 98.6 F (37 C) 97.9 F (36.6 C)  TempSrc:  Oral Oral   SpO2: 100% 100% 100% 100%  Weight:       Physical Exam Gen: comfortably resting during HD, no distress CV: irregularly irregular, no murmurs   Assessment/Plan:  Principal Problem:   Fever Active Problems:   Diabetes mellitus type 1, uncontrolled, insulin dependent (HCC)   PAF (paroxysmal atrial fibrillation) (HCC)   PAD (peripheral artery disease) (HCC)   Leukocytosis  Ms. Mooreisan 85 year old female with ESRD, complicated peripheral artery diseases/pleft AKA, diabetes,chronic diastolic CHF,andhypertension who presents withconfusionsigns of sepsis (fever, elevated lactic acid, and leukocytosis). She also has signs of fluid overload and elevated BNP consistent with acute on chronic diastolic heart failure.  Sepsis: Unclear sourceat this point.WBC continues to down trend, 25. Patient's mental status has returned to baseline and WBC trending down with no antibiotics. -Review blood smear -Blood culturesand urine culturesNGTD -Tylenol PRN fever and pain  Paroxysmal atrial fibrillation: in RVR. Home medication includes 12.5mg  qhs, however this was held after her last hospital admission due to hypotension. If she is still in atrial fibrillation in RVR after HD will resume metoprolol.   Acute on chronic diastolic heart failure: -Continuous cardiac monitoring -Will hold off on diuretics in the setting of active infection  ESRD on HD Hypokalemia: -3.2, HD today -Daily BMP  Diabetes:Takes Humalog 50-50 20 units qAM and 45 units qPM. - sliding scale tid w/ meals - CBG  checks  Dispo: Anticipated discharge in approximately 1 day(s).   Mike Craze, DO 07/24/2018, 10:20 AM Pager: [phone]

## 2018-07-24 NOTE — Progress Notes (Addendum)
Progress Note    07/24/2018 7:59 AM * No surgery found *  Subjective:  Seen on HD with Dr. Junius Argyle complaints  Afebrile VSS 100% RA  Vitals:   07/23/18 2113 07/24/18 0443  BP: (!) 140/42 (!) 160/63  Pulse: 73 83  Resp: 16 15  Temp: 98.6 F (37 C) 97.9 F (36.6 C)  SpO2: 100% 100%    Physical Exam: General:  No distress Lungs:  Non labored Incisions:  AKA site is healing nicely.   Extremities:  Right great toe, 5th toe and heel are all dry gangrene   CBC    Component Value Date/Time   WBC 26.5 (H) 07/24/2018 0723   RBC 3.00 (L) 07/24/2018 0723   HGB 8.0 (L) 07/24/2018 0723   HGB 11.0 (L) 05/07/2015 1251   HCT 26.6 (L) 07/24/2018 0723   HCT 34.0 (L) 05/07/2015 1251   PLT 269 07/24/2018 0723   PLT 207 05/07/2015 1251   MCV 88.7 07/24/2018 0723   MCV 84.0 05/07/2015 1251   MCH 26.7 07/24/2018 0723   MCHC 30.1 07/24/2018 0723   RDW 18.9 (H) 07/24/2018 0723   RDW 12.8 05/07/2015 1251   LYMPHSABS 2.4 07/19/2018 1940   LYMPHSABS 1.7 05/07/2015 1251   MONOABS 1.9 (H) 07/19/2018 1940   MONOABS 0.5 05/07/2015 1251   EOSABS 0.0 07/19/2018 1940   EOSABS 0.0 05/07/2015 1251   BASOSABS 0.1 07/19/2018 1940   BASOSABS 0.0 05/07/2015 1251    BMET    Component Value Date/Time   NA 135 07/24/2018 0723   NA 140 09/16/2015   NA 141 05/07/2015 1251   K 3.2 (L) 07/24/2018 0723   K 4.3 05/07/2015 1251   CL 98 07/24/2018 0723   CO2 24 07/24/2018 0723   CO2 22 05/07/2015 1251   GLUCOSE 193 (H) 07/24/2018 0723   GLUCOSE 110 05/07/2015 1251   BUN 35 (H) 07/24/2018 0723   BUN 34 (A) 09/16/2015   BUN 22.5 05/07/2015 1251   CREATININE 4.49 (H) 07/24/2018 0723   CREATININE 1.2 (H) 05/07/2015 1251   CALCIUM 8.6 (L) 07/24/2018 0723   CALCIUM 9.1 05/07/2015 1251   GFRNONAA 9 (L) 07/24/2018 0723   GFRAA 10 (L) 07/24/2018 0723    INR    Component Value Date/Time   INR 0.96 04/28/2018 1447     Intake/Output Summary (Last 24 hours) at 07/24/2018 0759 Last data  filed at 07/24/2018 0443 Gross per 24 hour  Intake 480 ml  Output 0 ml  Net 480 ml     Assessment:  85 y.o. female is s/p:  Left AKA:  Healing nicely.  Dr. Carlis Abbott feels the staples can come out today.  Will order for staples to be removed once pt is out of dialysis.   Gangrene right foot:  The heel, great toe and 5th toe all have dry gangrenous changes.  Do not feel this is the source of her sepsis.  She most recently underwent tibial intervention and it is felt there are no further revascularization options for this pt on the right leg.      Leontine Locket, PA-C [facility] [phone] 07/24/2018 7:59 AM  I have seen and evaluated the patient. I agree with the PA note as documented above. Left AKA healing without issue, will order staple removal since now 3 weeks post op and had post op appointment in clinic tomorrow.  Right 1/st5th toes dry and heel dry - no further revascularization options after previous AT atherectomy.  Biphasic signals.  [name], MD [facility] Office: [phone] Pager: [phone]

## 2018-07-25 ENCOUNTER — Encounter: Payer: Medicare (Managed Care) | Admitting: Vascular Surgery

## 2018-08-08 ENCOUNTER — Ambulatory Visit (INDEPENDENT_AMBULATORY_CARE_PROVIDER_SITE_OTHER): Payer: Self-pay | Admitting: Vascular Surgery

## 2018-08-08 ENCOUNTER — Other Ambulatory Visit: Payer: Self-pay

## 2018-08-08 ENCOUNTER — Encounter: Payer: Self-pay | Admitting: Vascular Surgery

## 2018-08-08 VITALS — BP 134/56 | HR 72 | Temp 97.1°F | Resp 20 | Ht 66.0 in | Wt 126.0 lb

## 2018-08-08 DIAGNOSIS — I739 Peripheral vascular disease, unspecified: Secondary | ICD-10-CM

## 2018-08-08 MED ORDER — TRAMADOL HCL 50 MG PO TABS: 50.0000 mg | ORAL_TABLET | Freq: Four times a day (QID) | ORAL | 0 refills | Status: AC | PRN

## 2018-08-08 NOTE — Progress Notes (Signed)
Patient name: Glenda [name] MRN: [id number] DOB: [birth year] Sex: female  REASON FOR VISIT: follow-up after L AKA and right leg revascularization  HPI: Glenda [name] is a 85 y.o. female with multiple medical problems that presents for interval follow-up and ongoing wound check.  She underwent a right lower extremity revascularization with an AT atherectomy and angioplasty for critical limb ischemia and tissue loss of the right great toe on 05/24/2018. She also underwent a left BKA on 05/26/2018 for critical limb ischemia with tissue loss.   BKA failed to heal and underwent AKA on 06/30/18. Ongoing dry gangrene of right great/5th toes and now heel ulcer.  Past Medical History:  Diagnosis Date  . Arthritis    patient denies  . Breast cancer ([place])    Breast Cancer right   treated with radiation  . CAD (coronary artery disease)   . Chest pain   . CHF (congestive heart failure) ([place])   . Constipation   . Dizziness   . DM (diabetes mellitus) ([place])    TYpe II  . ESRD (end stage renal disease) ([place])    TTHSAT- [address]  . GERD (gastroesophageal reflux disease)   . HLD (hyperlipidemia)   . HTN (hypertension)   . Obesity   . PAF (paroxysmal atrial fibrillation) ([place])   . Radiation 09/11/15-10/13/15   42.72 Gy to right breast, 12 Gy boost to right breast  . Shortness of breath dyspnea    04/27/2018- not so much since she began dialysis  . Sleep apnea    no longer uses cpap   . Stroke [facility])    balance issues - walks with walker and uses wheelchair  Had 3 in 2017- last 1 was in October. 3/5/2020uses wheel chair   . UTI (urinary tract infection)     Past Surgical History:  Procedure Laterality Date  . ABDOMINAL AORTOGRAM W/LOWER EXTREMITY N/A 04/27/2018   Procedure: ABDOMINAL AORTOGRAM W/LOWER EXTREMITY;  Surgeon: [name], MD;  Location: [facility];  Service: Cardiovascular;  Laterality: N/A;  . AMPUTATION Left 04/28/2018   Procedure: AMPUTATION RAY LEFT;  Surgeon:  [name], MD;  Location: [facility];  Service: Vascular;  Laterality: Left;  . AMPUTATION Left 05/26/2018   Procedure: AMPUTATION BELOW KNEE, LEFT;  Surgeon: [name], MD;  Location: [facility];  Service: Vascular;  Laterality: Left;  . AMPUTATION Left 06/30/2018   Procedure: AMPUTATION ABOVE KNEE;  Surgeon: [name], MD;  Location: [facility];  Service: Vascular;  Laterality: Left;  . AV FISTULA PLACEMENT Left 07/14/2017   Procedure: Left arm Brachicephalic Fistula Creation;  Surgeon: Serafina Mitchell, MD;  Location: [facility];  Service: Vascular;  Laterality: Left;  . BALLOON DILATION N/A 08/18/2017   Procedure: BALLOON DILATION;  Surgeon: Clarene Essex, MD;  Location: [facility];  Service: Endoscopy;  Laterality: N/A;  . BASCILIC VEIN TRANSPOSITION Left 10/12/2017   Procedure: SECOND STAGE BASILIC VEIN TRANSPOSITION LEFT ARM;  Surgeon: Serafina Mitchell, MD;  Location: [facility];  Service: Vascular;  Laterality: Left;  . CHOLECYSTECTOMY    . colon polyps; hx    . CORONARY ANGIOPLASTY  02/14/09  . ESOPHAGOGASTRODUODENOSCOPY (EGD) WITH PROPOFOL Left 09/08/2015   Procedure: ESOPHAGOGASTRODUODENOSCOPY (EGD) WITH PROPOFOL;  Surgeon: Clarene Essex, MD;  Location: [facility];  Service: Endoscopy;  Laterality: Left;  . ESOPHAGOGASTRODUODENOSCOPY (EGD) WITH PROPOFOL N/A 08/18/2017   Procedure: ESOPHAGOGASTRODUODENOSCOPY (EGD) WITH PROPOFOL;  Surgeon: Clarene Essex, MD;  Location: [facility];  Service: Endoscopy;  Laterality: N/A;  with flouroscopy  . IR FLUORO GUIDE CV LINE RIGHT  08/25/2017  . IR FLUORO GUIDE CV LINE RIGHT  08/29/2017  . IR US GUIDE VASC ACCESS RIGHT  08/25/2017  . LOWER EXTREMITY ANGIOGRAPHY Right 05/24/2018   Procedure: Lower Extremity Angiography;  Surgeon: [name], MD;  Location: [facility];  Service: Cardiovascular;  Laterality: Right;  . PERIPHERAL VASCULAR ATHERECTOMY Right 05/24/2018   Procedure: PERIPHERAL VASCULAR ATHERECTOMY;  Surgeon: [name], MD;  Location: [facility];  Service: Cardiovascular;  Laterality: Right;  Anterial Tibial  . RADIOACTIVE SEED GUIDED PARTIAL MASTECTOMY WITH AXILLARY SENTINEL LYMPH NODE BIOPSY Right 07/17/2015   Procedure: RADIOACTIVE SEED GUIDED PARTIAL MASTECTOMY WITH AXILLARY SENTINEL LYMPH NODE BIOPSY;  Surgeon: Erroll Luna, MD;  Location: [facility];  Service: General;  Laterality: Right;    Family History  Problem Relation Age of Onset  . Diabetes Mother   . Colon cancer Father        also had lung  . Diabetes Other   . Heart attack Other   . Diabetes Sister        [name]  . Breast cancer Sister     SOCIAL HISTORY: Social History   Tobacco Use  . Smoking status: Never Smoker  . Smokeless tobacco: Never Used  Substance Use Topics  . Alcohol use: No    Alcohol/week: 0.0 standard drinks    No Known Allergies  Current Outpatient Medications  Medication Sig Dispense Refill  . acetaminophen (TYLENOL) 500 MG tablet Take 1,000 mg by mouth at bedtime as needed for headache (pain).     . Amino Acids-Protein Hydrolys (FEEDING SUPPLEMENT, PRO-STAT SUGAR FREE 64,) LIQD Take 30 mLs by mouth 3 (three) times daily with meals.    Marland Kitchen aspirin 81 MG EC tablet Take 1 tablet (81 mg total) by mouth every evening. (Patient taking differently: Take 81 mg by mouth daily. ) 30 tablet   . calcium acetate (PHOSLO) 667 MG capsule Take 667 mg by mouth 3 (three) times daily with meals.    . citalopram (CELEXA) 20 MG tablet Take 20 mg by mouth daily.     . clopidogrel (PLAVIX) 75 MG tablet Take 1 tablet (75 mg total) by mouth daily. 90 tablet 1  . donepezil (ARICEPT) 5 MG tablet Take 5 mg by mouth at bedtime.    . feeding supplement, GLUCERNA SHAKE, (GLUCERNA SHAKE) LIQD Take 237 mLs by mouth daily.    Marland Kitchen HYDROmorphone (DILAUDID) 2 MG tablet Take 1 tablet (2 mg total) by mouth every 4 (four) hours as needed for moderate pain or severe pain. 30 tablet 0  . lidocaine-prilocaine (EMLA) cream Apply 1 application  topically See admin instructions. Apply topically prior to dialysis on Monday, Wednesday, Friday    . metoprolol succinate (TOPROL-XL) 25 MG 24 hr tablet Take 0.5 tablets (12.5 mg total) by mouth See admin instructions. Take on non hemodialysis days- Tuesday, Thursday, Saturday and Sunday 30 tablet 0  . multivitamin (RENA-VIT) TABS tablet Take 1 tablet by mouth daily.    . pantoprazole (PROTONIX) 20 MG tablet Take 20 mg by mouth daily.     . polyethylene glycol (MIRALAX / GLYCOLAX) packet Take 17 g by mouth daily. Mix in beverage of choice and drink    . pravastatin (PRAVACHOL) 40 MG tablet Take 40 mg by mouth daily.     Marland Kitchen senna-docusate (SENNA S) 8.6-50 MG tablet Take 2 tablets by mouth at bedtime as needed (to maintain comfortable bowel  patterns).    . traMADol (ULTRAM) 50 MG tablet Take 1 tablet (50 mg total) by mouth every 6 (six) hours as needed for moderate pain. 28 tablet 0   No current facility-administered medications for this visit.     REVIEW OF SYSTEMS:  [X]  denotes positive finding, [ ]  denotes negative finding Cardiac  Comments:  Chest pain or chest pressure:    Shortness of breath upon exertion:    Short of breath when lying flat:    Irregular heart rhythm:        Vascular    Pain in calf, thigh, or hip brought on by ambulation:    Pain in feet at night that wakes you up from your sleep:     Blood clot in your veins:    Leg swelling:         Pulmonary    Oxygen at home:    Productive cough:     Wheezing:         Neurologic    Sudden weakness in arms or legs:     Sudden numbness in arms or legs:     Sudden onset of difficulty speaking or slurred speech:    Temporary loss of vision in one eye:     Problems with dizziness:         Gastrointestinal    Blood in stool:     Vomited blood:         Genitourinary    Burning when urinating:     Blood in urine:        Psychiatric    Major depression:         Hematologic    Bleeding problems:    Problems with  blood clotting too easily:        Skin    Rashes or ulcers:        Constitutional    Fever or chills:      PHYSICAL EXAM: Vitals:   08/08/18 1039  BP: (!) 134/56  Pulse: 72  Resp: 20  Temp: (!) 97.1 F (36.2 C)  SpO2: 96%  Weight: 126 lb (57.2 kg)  Height: 5\' 6"  (1.676 m)    GENERAL: The patient is a well-nourished female, in no acute distress. The vital signs are documented above. CARDIAC: There is a regular rate and rhythm.  VASCULAR:  Left AKA healing well with staples  Right great toe dry gangrene, 5th toe dry gangrene, right heel ulcer Palpable femoral pulses bilaterally Right DP biphasic signal PULMONARY: There is good air exchange bilaterally without wheezing or rales. ABDOMEN: Soft and non-tender with normal pitched bowel sounds.  MUSCULOSKELETAL: There are no major deformities or cyanosis. NEUROLOGIC: No focal weakness or paresthesias are detected.  DATA:   none Assessment/Plan:  85 year old female with bilateral lower extremity critical limb ischemia with tissue loss.  She has most recently undergone a left AKA on 06/30/2018 as well as a right AT atherectomy and angioplasty on 05/24/2018 for critical ischemia with tissue loss of the right great toe.    Overall her left AKA appears to be healing well and staples were removed today.  She has no other options for revascularization of the right lower extremity and I suspect that her AT atherectomy remains patent given that she has a brisk dorsalis pedis signal.  She has dry gangrene of the right great toe, fifth toe, and now a fairly progressive heel ulcer.  I previously put her in a offloading shoe to try and get her  heel to improve but it has shown no signs of improvement.  I discussed with family that if she has worsening tissue loss and/or severe rest pain her next best option would be an amputation of her right leg as well.  They are not interested in that at this time.  I did send a prescription of tramadol to her  pharmacy to help with the rest pain in her foot.  I will see her again in 1 month to closely monitor her wounds.  [name], MD [facility] Office: [phone] Pager: [phone]

## 2018-08-25 DEATH — deceased

## 2018-09-05 ENCOUNTER — Ambulatory Visit: Payer: Medicare (Managed Care) | Admitting: Vascular Surgery

## 2019-01-18 ENCOUNTER — Inpatient Hospital Stay: Payer: Self-pay | Admitting: Hematology and Oncology
